# Patient Record
Sex: Female | Born: 1970
Health system: Southern US, Community
[De-identification: ages and names within clinical notes are randomized; demographics above are authoritative.]

## PROBLEM LIST (undated history)

## (undated) DIAGNOSIS — R011 Cardiac murmur, unspecified: Secondary | ICD-10-CM

## (undated) DIAGNOSIS — K7030 Alcoholic cirrhosis of liver without ascites: Secondary | ICD-10-CM

## (undated) DIAGNOSIS — K2950 Unspecified chronic gastritis without bleeding: Secondary | ICD-10-CM

## (undated) DIAGNOSIS — A0472 Enterocolitis due to Clostridium difficile, not specified as recurrent: Secondary | ICD-10-CM

## (undated) DIAGNOSIS — Z9289 Personal history of other medical treatment: Secondary | ICD-10-CM

## (undated) DIAGNOSIS — R51 Headache: Secondary | ICD-10-CM

## (undated) DIAGNOSIS — Z765 Malingerer [conscious simulation]: Secondary | ICD-10-CM

## (undated) DIAGNOSIS — T7840XA Allergy, unspecified, initial encounter: Secondary | ICD-10-CM

## (undated) DIAGNOSIS — J189 Pneumonia, unspecified organism: Secondary | ICD-10-CM

## (undated) DIAGNOSIS — K529 Noninfective gastroenteritis and colitis, unspecified: Secondary | ICD-10-CM

## (undated) DIAGNOSIS — D649 Anemia, unspecified: Secondary | ICD-10-CM

## (undated) DIAGNOSIS — I10 Essential (primary) hypertension: Secondary | ICD-10-CM

## (undated) DIAGNOSIS — G629 Polyneuropathy, unspecified: Secondary | ICD-10-CM

## (undated) DIAGNOSIS — S92902A Unspecified fracture of left foot, initial encounter for closed fracture: Secondary | ICD-10-CM

## (undated) DIAGNOSIS — K219 Gastro-esophageal reflux disease without esophagitis: Secondary | ICD-10-CM

## (undated) DIAGNOSIS — K8010 Calculus of gallbladder with chronic cholecystitis without obstruction: Secondary | ICD-10-CM

## (undated) DIAGNOSIS — F4310 Post-traumatic stress disorder, unspecified: Secondary | ICD-10-CM

## (undated) DIAGNOSIS — F419 Anxiety disorder, unspecified: Secondary | ICD-10-CM

## (undated) DIAGNOSIS — D849 Immunodeficiency, unspecified: Secondary | ICD-10-CM

## (undated) DIAGNOSIS — K7581 Nonalcoholic steatohepatitis (NASH): Secondary | ICD-10-CM

## (undated) DIAGNOSIS — R58 Hemorrhage, not elsewhere classified: Secondary | ICD-10-CM

## (undated) DIAGNOSIS — E039 Hypothyroidism, unspecified: Secondary | ICD-10-CM

## (undated) HISTORY — DX: Allergy, unspecified, initial encounter: T78.40XA

## (undated) HISTORY — PX: FRACTURE SURGERY: SHX138

## (undated) HISTORY — PX: UPPER GASTROINTESTINAL ENDOSCOPY: SHX188

## (undated) HISTORY — PX: COLONOSCOPY: SHX174

## (undated) HISTORY — PX: TONSILLECTOMY: SUR1361

## (undated) HISTORY — DX: Polyneuropathy, unspecified: G62.9

## (undated) HISTORY — PX: POLYPECTOMY: SHX149

---

## 2007-07-28 ENCOUNTER — Ambulatory Visit: Payer: Self-pay | Admitting: Family Medicine

## 2007-08-14 ENCOUNTER — Emergency Department (HOSPITAL_COMMUNITY): Admission: EM | Admit: 2007-08-14 | Discharge: 2007-08-14 | Payer: Self-pay | Admitting: Emergency Medicine

## 2007-08-17 ENCOUNTER — Ambulatory Visit: Payer: Self-pay | Admitting: Family Medicine

## 2007-08-17 ENCOUNTER — Encounter (INDEPENDENT_AMBULATORY_CARE_PROVIDER_SITE_OTHER): Payer: Self-pay | Admitting: Family Medicine

## 2007-08-17 LAB — CONVERTED CEMR LAB
ALT: 12 units/L (ref 0–35)
AST: 17 units/L (ref 0–37)
Alkaline Phosphatase: 49 units/L (ref 39–117)
BUN: 12 mg/dL (ref 6–23)
Chlamydia, DNA Probe: NEGATIVE
Eosinophils Absolute: 0.3 10*3/uL (ref 0.0–0.7)
Eosinophils Relative: 5 % (ref 0–5)
Lymphocytes Relative: 27 % (ref 12–46)
MCHC: 34.7 g/dL (ref 30.0–36.0)
MCV: 94.3 fL (ref 78.0–100.0)
Neutro Abs: 4.1 10*3/uL (ref 1.7–7.7)
Neutrophils Relative %: 60 % (ref 43–77)
Platelets: 369 10*3/uL (ref 150–400)
Total Protein: 7.2 g/dL (ref 6.0–8.3)

## 2007-08-18 ENCOUNTER — Ambulatory Visit: Payer: Self-pay | Admitting: *Deleted

## 2007-11-22 ENCOUNTER — Ambulatory Visit: Payer: Self-pay | Admitting: Family Medicine

## 2007-11-22 ENCOUNTER — Encounter (INDEPENDENT_AMBULATORY_CARE_PROVIDER_SITE_OTHER): Payer: Self-pay | Admitting: Family Medicine

## 2007-11-22 LAB — CONVERTED CEMR LAB
FSH: 4.5 milliintl units/mL
Free T4: 1.31 ng/dL (ref 0.89–1.80)
T3 Uptake Ratio: 34.1 % (ref 22.5–37.0)
T3, Total: 120.7 ng/dL (ref 80.0–204.0)

## 2007-12-06 ENCOUNTER — Ambulatory Visit (HOSPITAL_COMMUNITY): Admission: RE | Admit: 2007-12-06 | Discharge: 2007-12-06 | Payer: Self-pay | Admitting: Family Medicine

## 2008-01-27 ENCOUNTER — Other Ambulatory Visit: Admission: RE | Admit: 2008-01-27 | Discharge: 2008-01-27 | Payer: Self-pay | Admitting: Obstetrics and Gynecology

## 2008-01-27 ENCOUNTER — Ambulatory Visit: Payer: Self-pay | Admitting: Family Medicine

## 2008-02-10 ENCOUNTER — Ambulatory Visit: Payer: Self-pay | Admitting: Obstetrics and Gynecology

## 2008-03-26 ENCOUNTER — Emergency Department (HOSPITAL_COMMUNITY): Admission: EM | Admit: 2008-03-26 | Discharge: 2008-03-27 | Payer: Self-pay | Admitting: Emergency Medicine

## 2008-08-16 ENCOUNTER — Encounter: Payer: Self-pay | Admitting: Obstetrics and Gynecology

## 2008-08-16 ENCOUNTER — Encounter: Payer: Self-pay | Admitting: Family

## 2008-08-16 ENCOUNTER — Ambulatory Visit: Payer: Self-pay | Admitting: Obstetrics and Gynecology

## 2008-08-16 LAB — CONVERTED CEMR LAB: TSH: 0.835 microintl units/mL (ref 0.350–4.50)

## 2009-02-21 ENCOUNTER — Encounter: Payer: Self-pay | Admitting: Advanced Practice Midwife

## 2009-02-21 ENCOUNTER — Ambulatory Visit: Payer: Self-pay | Admitting: Obstetrics & Gynecology

## 2009-12-24 ENCOUNTER — Emergency Department (HOSPITAL_COMMUNITY): Admission: EM | Admit: 2009-12-24 | Discharge: 2009-12-24 | Payer: Self-pay | Admitting: Emergency Medicine

## 2011-03-04 NOTE — Group Therapy Note (Signed)
NAME:  Jamie Allen, Jamie Allen NO.:  [id number]   MEDICAL RECORD NO.:  [id number]          PATIENT TYPE:  WOC   LOCATION:  [facility]                   FACILITY:  [facility]   PHYSICIAN:  Andrew Au, MD        DATE OF BIRTH:  [birth year]   DATE OF SERVICE:  08/16/2008                                  CLINIC NOTE   REASON FOR VISIT:  Six-month followup Pap smear.   HISTORY OF PRESENT ILLNESS:  [name] did have an abnormal Pap smear  showing L-SIL in February 2009.  She did undergo colposcopy in April  2009, results of which were benign and showed no further L-SIL.  She is  here today for 33-monthfollowup Pap.  She also incidentally states she  has not had a period since the end of July.  This has become normal for  her over the last few years, going 2-3 months in between periods, and  even though normal bleeding about 2-3 days.  Otherwise, she has no  complaints and is doing well.   PHYSICAL EXAMINATION:  VITAL SIGNS:  Temperature today 97.8, pulse 69,  blood pressure 112/72, weight is 116 pounds, and height is 62 inches.  GU:  Normal external genitalia without lesions.  Normal introitus.  Vagina is pink and moist with normal rugae.  Cervix is visualized and  benign with no obvious lesions or abnormalities seen.  A Pap smear was  obtained without hemorrhage or friability.  Bimanual pelvic exam was  done, which noted a nontender uterus and ovaries, normal-sized uterus,  and no masses appreciated.  No adnexal masses appreciated.   ASSESSMENT AND PLAN:  Ms. SLotteris a 40year old female with a history  of low-grade squamous intraepithelial lesion and Pap smear and irregular  menses, who comes in today for a repeat Pap smear.  1. For Pap smear.  Pap was obtained today.  She will be called with      the results.  2. For irregular bleeding.  The patient is not desiring to become      pregnant.  Today, we will check urine pregnancy test, although her      uterus was of normal  size today, and we will also check a TSH as      thyroid could possibly be causing her menstrual irregularities.      She was given a prescription for Provera 10 mg and instructed to      take 1 tablet twice daily for 5 days when she has gone greater than      6 weeks without having a period.  She understands this and will      follow up as instructed following her Pap smear results.     ______________________________  LOrland Mustard MD    ______________________________  PAndrew Au MD    LM/MEDQ  D:  08/16/2008  T:  08/17/2008  Job:  [id number]

## 2011-05-04 ENCOUNTER — Emergency Department (HOSPITAL_COMMUNITY)
Admission: EM | Admit: 2011-05-04 | Discharge: 2011-05-05 | Disposition: A | Discharge status: Home / Self Care | Payer: Self-pay | Attending: Emergency Medicine | Admitting: Emergency Medicine

## 2011-05-04 DIAGNOSIS — R109 Unspecified abdominal pain: Secondary | ICD-10-CM | POA: Insufficient documentation

## 2011-05-04 DIAGNOSIS — R1011 Right upper quadrant pain: Secondary | ICD-10-CM | POA: Insufficient documentation

## 2011-05-04 DIAGNOSIS — R112 Nausea with vomiting, unspecified: Secondary | ICD-10-CM | POA: Insufficient documentation

## 2011-05-04 DIAGNOSIS — R1031 Right lower quadrant pain: Secondary | ICD-10-CM | POA: Insufficient documentation

## 2011-05-04 DIAGNOSIS — F1010 Alcohol abuse, uncomplicated: Secondary | ICD-10-CM | POA: Insufficient documentation

## 2011-05-05 LAB — URINALYSIS, ROUTINE W REFLEX MICROSCOPIC
Ketones, ur: NEGATIVE mg/dL
Leukocytes, UA: NEGATIVE
Nitrite: NEGATIVE
Protein, ur: NEGATIVE mg/dL
Specific Gravity, Urine: 1.015 (ref 1.005–1.030)

## 2011-05-05 LAB — COMPREHENSIVE METABOLIC PANEL
Albumin: 4.3 g/dL (ref 3.5–5.2)
CO2: 24 mEq/L (ref 19–32)
Chloride: 104 mEq/L (ref 96–112)
Creatinine, Ser: 0.96 mg/dL (ref 0.50–1.10)
Potassium: 3.3 mEq/L — ABNORMAL LOW (ref 3.5–5.1)
Sodium: 138 mEq/L (ref 135–145)
Total Protein: 8.1 g/dL (ref 6.0–8.3)

## 2011-05-05 LAB — ETHANOL: Alcohol, Ethyl (B): <11 mg/dL (ref 0–11)

## 2011-05-05 LAB — POCT PREGNANCY, URINE: Preg Test, Ur: NEGATIVE

## 2011-05-05 LAB — CBC
Hemoglobin: 14.2 g/dL (ref 12.0–15.0)
MCHC: 35.8 g/dL (ref 30.0–36.0)
RBC: 4.1 MIL/uL (ref 3.87–5.11)
RDW: 13.2 % (ref 11.5–15.5)

## 2011-05-05 LAB — DIFFERENTIAL: Basophils Relative: 0 % (ref 0–1)

## 2011-07-15 LAB — POCT PREGNANCY, URINE: Operator id: 148111

## 2011-07-17 LAB — DIFFERENTIAL
Basophils Relative: 1
Lymphocytes Relative: 22

## 2011-07-17 LAB — POCT I-STAT, CHEM 8
BUN: 13
Chloride: 107
Creatinine, Ser: 0.8
Glucose, Bld: 122 — ABNORMAL HIGH
HCT: 43
Hemoglobin: 14.6
Potassium: 4.1
Sodium: 137

## 2011-07-17 LAB — CBC
Hemoglobin: 13.9
MCHC: 34.8
Platelets: 285
RDW: 13.2

## 2011-07-17 LAB — POCT CARDIAC MARKERS
CKMB, poc: 1.1
Myoglobin, poc: 55.2
Operator id: 261601

## 2011-07-30 LAB — COMPREHENSIVE METABOLIC PANEL
ALT: 16
CO2: 23
Calcium: 9.6
Creatinine, Ser: 0.83
GFR calc non Af Amer: >60
Glucose, Bld: 111 — ABNORMAL HIGH
Sodium: 139

## 2011-07-30 LAB — DIFFERENTIAL
Basophils Absolute: 0.1
Basophils Relative: 1
Eosinophils Absolute: 0.9 — ABNORMAL HIGH
Lymphs Abs: 2.1
Neutrophils Relative %: 54

## 2011-07-30 LAB — CBC
HCT: 43.2
Hemoglobin: 15.1 — ABNORMAL HIGH
MCHC: 35
MCV: 95
Platelets: 384
RBC: 4.54

## 2011-07-30 LAB — URINALYSIS, ROUTINE W REFLEX MICROSCOPIC
Bilirubin Urine: NEGATIVE
Glucose, UA: NEGATIVE
Hgb urine dipstick: NEGATIVE
Specific Gravity, Urine: 1.009
pH: 6.5

## 2011-07-30 LAB — WET PREP, GENITAL
Trich, Wet Prep: NONE SEEN
Yeast Wet Prep HPF POC: NONE SEEN

## 2011-07-30 LAB — GC/CHLAMYDIA PROBE AMP, GENITAL: GC Probe Amp, Genital: NEGATIVE

## 2011-11-26 ENCOUNTER — Emergency Department (HOSPITAL_COMMUNITY)
Admission: EM | Admit: 2011-11-26 | Discharge: 2011-11-26 | Disposition: A | Discharge status: Home / Self Care | Payer: Self-pay | Attending: Emergency Medicine | Admitting: Emergency Medicine

## 2011-11-26 ENCOUNTER — Other Ambulatory Visit: Payer: Self-pay

## 2011-11-26 ENCOUNTER — Encounter (HOSPITAL_COMMUNITY): Payer: Self-pay | Admitting: *Deleted

## 2011-11-26 DIAGNOSIS — Z79899 Other long term (current) drug therapy: Secondary | ICD-10-CM | POA: Insufficient documentation

## 2011-11-26 DIAGNOSIS — F411 Generalized anxiety disorder: Secondary | ICD-10-CM | POA: Insufficient documentation

## 2011-11-26 DIAGNOSIS — I10 Essential (primary) hypertension: Secondary | ICD-10-CM | POA: Insufficient documentation

## 2011-11-26 DIAGNOSIS — F16283 Hallucinogen dependence with hallucinogen persisting perception disorder (flashbacks): Secondary | ICD-10-CM

## 2011-11-26 DIAGNOSIS — F419 Anxiety disorder, unspecified: Secondary | ICD-10-CM

## 2011-11-26 DIAGNOSIS — F19988 Other psychoactive substance use, unspecified with other psychoactive substance-induced disorder: Secondary | ICD-10-CM | POA: Insufficient documentation

## 2011-11-26 DIAGNOSIS — Z7982 Long term (current) use of aspirin: Secondary | ICD-10-CM | POA: Insufficient documentation

## 2011-11-26 HISTORY — DX: Essential (primary) hypertension: I10

## 2011-11-26 MED ORDER — ZOLPIDEM TARTRATE 5 MG PO TABS: 5.0000 mg | ORAL_TABLET | Freq: Every evening | ORAL | Status: DC | PRN
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
## 2011-11-26 NOTE — ED Notes (Addendum)
Pt in c/o high blood pressure x4 days, states she is supposed to be on medication for this but is unable to afford it, c/o headache and dizziness and blurred vision- also c/o heaviness on her chest x2 hours and shortness of breath

## 2012-07-15 ENCOUNTER — Emergency Department (HOSPITAL_COMMUNITY)
Admission: EM | Admit: 2012-07-15 | Discharge: 2012-07-15 | Disposition: A | Discharge status: Home / Self Care | Payer: Self-pay | Attending: Emergency Medicine | Admitting: Emergency Medicine

## 2012-07-15 ENCOUNTER — Encounter (HOSPITAL_COMMUNITY): Payer: Self-pay | Admitting: Emergency Medicine

## 2012-07-15 DIAGNOSIS — K029 Dental caries, unspecified: Secondary | ICD-10-CM | POA: Insufficient documentation

## 2012-07-15 DIAGNOSIS — I10 Essential (primary) hypertension: Secondary | ICD-10-CM | POA: Insufficient documentation

## 2012-07-15 DIAGNOSIS — F17200 Nicotine dependence, unspecified, uncomplicated: Secondary | ICD-10-CM | POA: Insufficient documentation

## 2012-07-15 MED ORDER — HYDROCODONE-ACETAMINOPHEN 5-325 MG PO TABS: 1.0000 | ORAL_TABLET | Freq: Four times a day (QID) | ORAL | Status: DC | PRN

## 2012-07-15 MED ORDER — PENICILLIN V POTASSIUM 500 MG PO TABS: 500.0000 mg | ORAL_TABLET | Freq: Four times a day (QID) | ORAL | Status: DC

## 2012-07-15 MED ORDER — PENICILLIN V POTASSIUM 500 MG PO TABS
500.0000 mg | ORAL_TABLET | Freq: Once | ORAL | Status: AC
  Administered 2012-07-15: 500 mg via ORAL
  Filled 2012-07-15: qty 1

## 2012-07-15 MED ORDER — HYDROCODONE-ACETAMINOPHEN 5-325 MG PO TABS
1.0000 | ORAL_TABLET | Freq: Once | ORAL | Status: AC
  Administered 2012-07-15: 1 via ORAL
  Filled 2012-07-15: qty 1

## 2012-07-15 NOTE — ED Provider Notes (Signed)
History     CSN: [id number]  Arrival date & time 07/15/12  1653   First MD Initiated Contact with Patient 07/15/12 1715      Chief Complaint  Patient presents with  . Dental Pain    (Consider location/radiation/quality/duration/timing/severity/associated sxs/prior treatment) HPI Comments: Patient presents with extensive dental decay, and gum swelling is seen going on for months.  She states she has a dentist appointment in about 2 weeks.  She is taking over-the-counter BC powder, without any relief  Patient is a 41 y.o. female presenting with tooth pain. The history is provided by the patient.  Dental PainThe primary symptoms include mouth pain and dental injury. Primary symptoms do not include headaches, fever or sore throat. The symptoms began more than 1 month ago. The symptoms are worsening. The symptoms occur constantly.  Additional symptoms do not include: trouble swallowing.    Past Medical History  Diagnosis Date  . Hypertension   . Ovarian cyst     History reviewed. No pertinent past surgical history.  No family history on file.  History  Substance Use Topics  . Smoking status: Current Some Day Smoker  . Smokeless tobacco: Not on file  . Alcohol Use: Yes     occassionally    OB History    Grav Para Term Preterm Abortions TAB SAB Ect Mult Living                  Review of Systems  Constitutional: Negative for fever and chills.  HENT: Positive for dental problem. Negative for congestion, sore throat, rhinorrhea, trouble swallowing and neck stiffness.   Neurological: Negative for dizziness, weakness and headaches.    Allergies  Morphine and related  Home Medications   Current Outpatient Rx  Name Route Sig Dispense Refill  . BC HEADACHE POWDER PO Oral Take 2 packets by mouth as needed. packet    . HYDROCODONE-ACETAMINOPHEN 5-325 MG PO TABS Oral Take 1 tablet by mouth every 6 (six) hours as needed for pain. 17 tablet 0  . PENICILLIN V POTASSIUM 500 MG  PO TABS Oral Take 1 tablet (500 mg total) by mouth 4 (four) times daily. 39 tablet 0    BP 139/95  Pulse 107  Temp 98.5 F (36.9 C) (Oral)  Resp 20  SpO2 100%  LMP 06/14/2012  Physical Exam  Constitutional: She appears well-developed and well-nourished.  HENT:  Head: Normocephalic.       Every 2 thoracic cavity, most or just shells with central decay.  The upper central incisors have decay along the gumline.  The lower incisors have decay, anterior and posterior along the gumline.  With moderate amount of gum swelling  Eyes: Pupils are equal, round, and reactive to light.  Neck: Normal range of motion.  Cardiovascular: Normal rate.   Pulmonary/Chest: Effort normal.  Abdominal: Soft.  Musculoskeletal: Normal range of motion.  Neurological: She is alert.  Skin: Skin is warm.    ED Course  Procedures (including critical care time)  Labs Reviewed - No data to display No results found.   1. Dental decay       MDM   Extensive dental decay.  Will start patient on antibiotic pain medication.  And fortunately, she was referred to the dental clinic.  That is being held tomorrow, and Saturday        Arman Filter, NP 07/15/12 7846

## 2012-07-15 NOTE — ED Notes (Signed)
Pt presenting to ed with c/o left lower toothache pain x 2 weeks pt states pain is unbearable. Pt states "my boyfriend is taking a Designer, fashion/clothing so that I can get my mouth fixed" pt states she has swelling to her face.

## 2012-07-15 NOTE — ED Provider Notes (Signed)
Medical screening examination/treatment/procedure(s) were performed by non-physician practitioner and as supervising physician I was immediately available for consultation/collaboration. Devoria Albe, MD, Armando Gang   Ward Givens, MD 07/15/12 [phone]

## 2012-08-21 ENCOUNTER — Encounter (HOSPITAL_COMMUNITY): Payer: Self-pay | Admitting: Emergency Medicine

## 2012-08-21 ENCOUNTER — Emergency Department (HOSPITAL_COMMUNITY)
Admission: EM | Admit: 2012-08-21 | Discharge: 2012-08-21 | Disposition: A | Discharge status: Home / Self Care | Payer: Self-pay | Attending: Emergency Medicine | Admitting: Emergency Medicine

## 2012-08-21 DIAGNOSIS — F17200 Nicotine dependence, unspecified, uncomplicated: Secondary | ICD-10-CM | POA: Insufficient documentation

## 2012-08-21 DIAGNOSIS — K0889 Other specified disorders of teeth and supporting structures: Secondary | ICD-10-CM

## 2012-08-21 DIAGNOSIS — I10 Essential (primary) hypertension: Secondary | ICD-10-CM | POA: Insufficient documentation

## 2012-08-21 DIAGNOSIS — Z8742 Personal history of other diseases of the female genital tract: Secondary | ICD-10-CM | POA: Insufficient documentation

## 2012-08-21 DIAGNOSIS — K089 Disorder of teeth and supporting structures, unspecified: Secondary | ICD-10-CM | POA: Insufficient documentation

## 2012-08-21 MED ORDER — PENICILLIN V POTASSIUM 500 MG PO TABS: 500.0000 mg | ORAL_TABLET | Freq: Four times a day (QID) | ORAL | Status: DC

## 2012-08-21 MED ORDER — OXYCODONE-ACETAMINOPHEN 5-325 MG PO TABS
2.0000 | ORAL_TABLET | Freq: Once | ORAL | Status: AC
  Administered 2012-08-21: 2 via ORAL
  Filled 2012-08-21: qty 2

## 2012-08-21 MED ORDER — HYDROCODONE-ACETAMINOPHEN 5-325 MG PO TABS: 2.0000 | ORAL_TABLET | ORAL | Status: DC | PRN

## 2012-08-21 MED ORDER — HYDROCODONE-ACETAMINOPHEN 5-325 MG PO TABS: 1.0000 | ORAL_TABLET | ORAL | Status: DC | PRN

## 2012-08-21 NOTE — ED Notes (Signed)
Was seen here about a month ago for multiple dental caries-- is going to [facility] to have all teeth out at a clinic in a month

## 2012-08-21 NOTE — ED Provider Notes (Signed)
History     CSN: [id number]  Arrival date & time 08/21/12  6578   First MD Initiated Contact with Patient 08/21/12 1130      Chief Complaint  Patient presents with  . tooth abcess     (Consider location/radiation/quality/duration/timing/severity/associated sxs/prior treatment) HPI Comments: This is a 41 year old female, who presents to the emergency department with chief complaint of toothache and dental pain. Patient states that her mouth is bothering her for the past month. She has been seen repeatedly for the same complaint, and states that she is going to [place] to see an Transport planner about having her teeth removed and dentures placed. Patient endorses moderate to severe pain. She denies fever, nausea, vomiting, difficulty swallowing. She states that when she gets antibiotics for pain will receive, but that will come back a couple of weeks later. She is asking for one more course to last her until she can make it to the dentist on November 25.  The history is provided by the patient. No language interpreter was used.    Past Medical History  Diagnosis Date  . Hypertension   . Ovarian cyst     History reviewed. No pertinent past surgical history.  No family history on file.  History  Substance Use Topics  . Smoking status: Current Some Day Smoker  . Smokeless tobacco: Not on file  . Alcohol Use: Yes     occassionally    OB History    Grav Para Term Preterm Abortions TAB SAB Ect Mult Living                  Review of Systems  HENT:       "Rotten teeth"  All other systems reviewed and are negative.    Allergies  Morphine and related  Home Medications   Current Outpatient Rx  Name Route Sig Dispense Refill  . BC HEADACHE POWDER PO Oral Take 2 packets by mouth as needed. packet    . HYDROCODONE-ACETAMINOPHEN 5-325 MG PO TABS Oral Take 1 tablet by mouth every 6 (six) hours as needed for pain. 17 tablet 0  . HYDROCODONE-ACETAMINOPHEN 5-325 MG PO TABS  Oral Take 1 tablet by mouth every 4 (four) hours as needed for pain. 12 tablet 0  . PENICILLIN V POTASSIUM 500 MG PO TABS Oral Take 1 tablet (500 mg total) by mouth 4 (four) times daily. 39 tablet 0  . PENICILLIN V POTASSIUM 500 MG PO TABS Oral Take 1 tablet (500 mg total) by mouth 4 (four) times daily. 40 tablet 0    BP 149/92  Pulse 97  Temp 98.3 F (36.8 C) (Oral)  Resp 18  SpO2 96%  LMP 07/28/2012  Physical Exam  Nursing note and vitals reviewed. Constitutional: She is oriented to person, place, and time. She appears well-developed and well-nourished.  HENT:  Head: Normocephalic and atraumatic.       Poor dentition throughout, mild swelling and erythema surrounding gingiva, no fluctuance, uvula is midline, no signs of peritonsillar or tonsillar abscess. The airway is intact.  Eyes: Conjunctivae normal and EOM are normal. Pupils are equal, round, and reactive to light.  Neck: Normal range of motion. Neck supple.  Cardiovascular: Normal rate, regular rhythm and normal heart sounds.   Pulmonary/Chest: Effort normal and breath sounds normal. No respiratory distress. She has no wheezes. She has no rales. She exhibits no tenderness.  Abdominal: Soft. Bowel sounds are normal. She exhibits no distension and no mass. There is no tenderness.  There is no rebound and no guarding.  Musculoskeletal: Normal range of motion.  Neurological: She is alert and oriented to person, place, and time.  Skin: Skin is warm and dry.  Psychiatric: She has a normal mood and affect. Her behavior is normal. Judgment and thought content normal.    ED Course  Procedures (including critical care time)  Labs Reviewed - No data to display No results found.   1. Toothache       MDM  41 year old female with dental pain. I'm going to prescribe penicillin and a short course of pain medicine for the patient again. Patient states that she is going to followup with the dentist later this month, and that she has  appointment scheduled already. I told her that we are not going to be able to fix her teeth, and that she must see the dentist. She acknowledges this, and requests antibiotics at this can last her until she sees the dentist. Return precautions have been given. The patient is stable and ready for discharge.        Roxy Horseman, PA-C 08/21/12 1256

## 2012-08-21 NOTE — ED Provider Notes (Signed)
Medical screening examination/treatment/procedure(s) were performed by non-physician practitioner and as supervising physician I was immediately available for consultation/collaboration.  Doug Sou, MD 08/21/12 [phone]

## 2013-03-04 ENCOUNTER — Emergency Department (HOSPITAL_BASED_OUTPATIENT_CLINIC_OR_DEPARTMENT_OTHER)
Admission: EM | Admit: 2013-03-04 | Discharge: 2013-03-04 | Disposition: A | Discharge status: Home / Self Care | Payer: Self-pay | Attending: Emergency Medicine | Admitting: Emergency Medicine

## 2013-03-04 ENCOUNTER — Encounter (HOSPITAL_BASED_OUTPATIENT_CLINIC_OR_DEPARTMENT_OTHER): Payer: Self-pay | Admitting: *Deleted

## 2013-03-04 DIAGNOSIS — Z8742 Personal history of other diseases of the female genital tract: Secondary | ICD-10-CM | POA: Insufficient documentation

## 2013-03-04 DIAGNOSIS — R197 Diarrhea, unspecified: Secondary | ICD-10-CM | POA: Insufficient documentation

## 2013-03-04 DIAGNOSIS — F17200 Nicotine dependence, unspecified, uncomplicated: Secondary | ICD-10-CM | POA: Insufficient documentation

## 2013-03-04 DIAGNOSIS — R112 Nausea with vomiting, unspecified: Secondary | ICD-10-CM | POA: Insufficient documentation

## 2013-03-04 DIAGNOSIS — Z3202 Encounter for pregnancy test, result negative: Secondary | ICD-10-CM | POA: Insufficient documentation

## 2013-03-04 DIAGNOSIS — I10 Essential (primary) hypertension: Secondary | ICD-10-CM | POA: Insufficient documentation

## 2013-03-04 LAB — CBC WITH DIFFERENTIAL/PLATELET
Basophils Relative: 1 % (ref 0–1)
Eosinophils Relative: 4 % (ref 0–5)
HCT: 38.1 % (ref 36.0–46.0)
Hemoglobin: 14.2 g/dL (ref 12.0–15.0)
Lymphs Abs: 2 10*3/uL (ref 0.7–4.0)
MCH: 37.4 pg — ABNORMAL HIGH (ref 26.0–34.0)
MCV: 100.3 fL — ABNORMAL HIGH (ref 78.0–100.0)
Monocytes Absolute: 0.4 10*3/uL (ref 0.1–1.0)
Neutro Abs: 3.5 10*3/uL (ref 1.7–7.7)
RBC: 3.8 MIL/uL — ABNORMAL LOW (ref 3.87–5.11)

## 2013-03-04 LAB — COMPREHENSIVE METABOLIC PANEL
BUN: 4 mg/dL — ABNORMAL LOW (ref 6–23)
Calcium: 9.5 mg/dL (ref 8.4–10.5)
GFR calc Af Amer: >90 mL/min (ref >90)
GFR calc non Af Amer: 90 mL/min — ABNORMAL LOW (ref >90)
Glucose, Bld: 101 mg/dL — ABNORMAL HIGH (ref 70–99)
Total Protein: 6.8 g/dL (ref 6.0–8.3)

## 2013-03-04 LAB — URINALYSIS, ROUTINE W REFLEX MICROSCOPIC
Ketones, ur: NEGATIVE mg/dL
Leukocytes, UA: NEGATIVE
Nitrite: NEGATIVE
Specific Gravity, Urine: 1.004 — ABNORMAL LOW (ref 1.005–1.030)
Urobilinogen, UA: 0.2 mg/dL (ref 0.0–1.0)
pH: 6.5 (ref 5.0–8.0)

## 2013-03-04 MED ORDER — SODIUM CHLORIDE 0.9 % IV BOLUS (SEPSIS)
1000.0000 mL | Freq: Once | INTRAVENOUS | Status: AC
  Administered 2013-03-04: 1000 mL via INTRAVENOUS

## 2013-03-04 MED ORDER — DROPERIDOL 2.5 MG/ML IJ SOLN: 1.2500 mg | Freq: Once | INTRAMUSCULAR | Status: DC

## 2013-03-04 MED ORDER — POTASSIUM CHLORIDE CRYS ER 20 MEQ PO TBCR
40.0000 meq | EXTENDED_RELEASE_TABLET | Freq: Once | ORAL | Status: AC
  Administered 2013-03-04: 40 meq via ORAL
  Filled 2013-03-04: qty 2

## 2013-03-04 MED ORDER — DIPHENHYDRAMINE HCL 50 MG/ML IJ SOLN
25.0000 mg | Freq: Once | INTRAMUSCULAR | Status: AC
  Administered 2013-03-04: 25 mg via INTRAVENOUS
  Filled 2013-03-04: qty 1

## 2013-03-04 MED ORDER — SODIUM CHLORIDE 0.9 % IV BOLUS (SEPSIS)
25.0000 mL | Freq: Once | INTRAVENOUS | Status: AC
  Administered 2013-03-04: 25 mL via INTRAVENOUS

## 2013-03-04 MED ORDER — PROMETHAZINE HCL 25 MG/ML IJ SOLN
25.0000 mg | Freq: Once | INTRAMUSCULAR | Status: AC
  Administered 2013-03-04: 25 mg via INTRAVENOUS
  Filled 2013-03-04: qty 1

## 2013-03-04 MED ORDER — ONDANSETRON HCL 4 MG/2ML IJ SOLN
4.0000 mg | Freq: Once | INTRAMUSCULAR | Status: AC
  Administered 2013-03-04: 4 mg via INTRAVENOUS
  Filled 2013-03-04: qty 2

## 2013-03-04 MED ORDER — ONDANSETRON 8 MG PO TBDP: 8.0000 mg | ORAL_TABLET | Freq: Three times a day (TID) | ORAL | Status: DC | PRN

## 2013-03-04 NOTE — ED Notes (Signed)
Pt reports feeling better but still has mild cramping in abdomen and headache is still present but not as severe.

## 2013-03-04 NOTE — ED Notes (Signed)
Pt discharged home with spouse  

## 2013-03-04 NOTE — ED Provider Notes (Signed)
History     CSN: [id number]  Arrival date & time 03/04/13  1830   First MD Initiated Contact with Patient 03/04/13 1850      Chief Complaint  Patient presents with  . Emesis  . Migraine  . Diarrhea    (Consider location/radiation/quality/duration/timing/severity/associated sxs/prior treatment) HPI Patient presents today complaining of four-day history of headache with nausea, vomiting, and diarrhea today. She has had some subjective fever and chills. She denies any chest pain, cough, nasal congestion, or rash. 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 Past Medical History  Diagnosis Date  . Hypertension   . Ovarian cyst     History reviewed. No pertinent past surgical history.  History reviewed. No pertinent family history.  History  Substance Use Topics  . Smoking status: Current Some Day Smoker -- 0.50 packs/day    Types: Cigarettes  . Smokeless tobacco: Not on file  . Alcohol Use: No     Comment: occassionally    OB History   Grav Para Term Preterm Abortions TAB SAB Ect Mult Living                  Review of Systems  Allergies  Morphine and related  Home Medications   Current Outpatient Rx  Name  Route  Sig  Dispense  Refill  . Aspirin-Salicylamide-Caffeine (BC HEADACHE POWDER PO)   Oral   Take 2 packets by mouth as needed. packet         . HYDROcodone-acetaminophen (NORCO/VICODIN) 5-325 MG per tablet   Oral   Take 1 tablet by mouth every 6 (six) hours as needed for pain.   17 tablet   0   . HYDROcodone-acetaminophen (NORCO/VICODIN) 5-325 MG per tablet   Oral   Take 1 tablet by mouth every 4 (four) hours as needed for pain.   12 tablet   0   . penicillin v potassium (VEETID) 500 MG tablet   Oral   Take 1 tablet (500 mg total) by mouth 4 (four) times daily.   39 tablet   0   . penicillin v potassium (VEETID) 500 MG tablet   Oral   Take 1 tablet (500 mg total) by mouth 4 (four) times  daily.   40 tablet   0     BP 144/94  Pulse 102  Temp(Src) 98.5 F (36.9 C) (Oral)  Resp 16  Ht 5' 1"  (1.549 m)  Wt 115 lb (52.164 kg)  BMI 21.74 kg/m2  SpO2 100%  LMP 02/06/2013  Physical Exam  Nursing note and vitals reviewed. Constitutional: She is oriented to person, place, and time. She appears well-developed and well-nourished.  HENT:  Head: Normocephalic and atraumatic.  Eyes: Conjunctivae are normal. Pupils are equal, round, and reactive to light.  Neck: Normal range of motion. Neck supple.  Cardiovascular: Normal rate, regular rhythm, normal heart sounds and intact distal pulses.   Pulmonary/Chest: Effort normal and breath sounds normal.  Abdominal: Soft. Bowel sounds are normal.  Musculoskeletal: Normal range of motion.  Neurological: She is alert and oriented to person, place, and time.  Skin: Skin is warm and dry.  Psychiatric: She has a normal mood and affect. Her behavior is normal. Judgment and thought content normal.    ED Course  Procedures (including critical care time)  Labs Reviewed  CBC WITH DIFFERENTIAL - Abnormal; Notable for the following:    RBC 3.80 (*)    MCV 100.3 (*)  MCH 37.4 (*)    MCHC 37.3 (*)    All other components within normal limits  COMPREHENSIVE METABOLIC PANEL - Abnormal; Notable for the following:    Potassium 3.2 (*)    Glucose, Bld 101 (*)    BUN 4 (*)    Total Bilirubin 0.2 (*)    GFR calc non Af Amer 90 (*)    All other components within normal limits  URINALYSIS, ROUTINE W REFLEX MICROSCOPIC - Abnormal; Notable for the following:    Specific Gravity, Urine 1.004 (*)    All other components within normal limits   No results found.   No diagnosis found.    MDM  Patient improved after iv ns and antiemetics No vomiting         Shaune Pollack, MD 03/04/13 2107

## 2013-03-04 NOTE — ED Notes (Signed)
Pt reports n/v/d x 3 days.  

## 2013-06-12 ENCOUNTER — Emergency Department (HOSPITAL_BASED_OUTPATIENT_CLINIC_OR_DEPARTMENT_OTHER)
Admission: EM | Admit: 2013-06-12 | Discharge: 2013-06-12 | Disposition: A | Discharge status: Home / Self Care | Payer: Self-pay | Attending: Emergency Medicine | Admitting: Emergency Medicine

## 2013-06-12 ENCOUNTER — Encounter (HOSPITAL_BASED_OUTPATIENT_CLINIC_OR_DEPARTMENT_OTHER): Payer: Self-pay | Admitting: *Deleted

## 2013-06-12 DIAGNOSIS — R21 Rash and other nonspecific skin eruption: Secondary | ICD-10-CM | POA: Insufficient documentation

## 2013-06-12 DIAGNOSIS — Z8742 Personal history of other diseases of the female genital tract: Secondary | ICD-10-CM | POA: Insufficient documentation

## 2013-06-12 DIAGNOSIS — R220 Localized swelling, mass and lump, head: Secondary | ICD-10-CM | POA: Insufficient documentation

## 2013-06-12 DIAGNOSIS — K1370 Unspecified lesions of oral mucosa: Secondary | ICD-10-CM | POA: Insufficient documentation

## 2013-06-12 DIAGNOSIS — F17200 Nicotine dependence, unspecified, uncomplicated: Secondary | ICD-10-CM | POA: Insufficient documentation

## 2013-06-12 DIAGNOSIS — I10 Essential (primary) hypertension: Secondary | ICD-10-CM | POA: Insufficient documentation

## 2013-06-12 DIAGNOSIS — T4995XA Adverse effect of unspecified topical agent, initial encounter: Secondary | ICD-10-CM | POA: Insufficient documentation

## 2013-06-12 DIAGNOSIS — T7840XA Allergy, unspecified, initial encounter: Secondary | ICD-10-CM

## 2013-06-12 NOTE — ED Notes (Signed)
Patient c/o facial swelling/redness/tongue swelling, took ibuprofen no relief

## 2013-06-12 NOTE — ED Provider Notes (Signed)
CSN: [id number]     Arrival date & time 06/12/13  0815 History     First MD Initiated Contact with Patient 06/12/13 0818     Chief Complaint  Patient presents with  . Allergic Reaction   (Consider location/radiation/quality/duration/timing/severity/associated sxs/prior Treatment) Patient is a 42 y.o. female presenting with allergic reaction. The history is provided by the patient.  Allergic Reaction Presenting symptoms: rash and swelling   Presenting symptoms: no difficulty breathing, no difficulty swallowing, no itching and no wheezing   Rash:    Location:  Face   Quality: blistering and draining     Severity:  Mild   Onset quality:  Gradual   Timing:  Intermittent   Progression:  Unchanged Severity:  Mild Prior allergic episodes:  No prior episodes Context: new detergents/soaps (possible)   Context: no animal exposure, no chemicals, no insect bite/sting, no medications, no nuts and no poison ivy   Relieved by:  Nothing Worsened by:  Nothing tried   Past Medical History  Diagnosis Date  . Hypertension   . Ovarian cyst    Past Surgical History  Procedure Laterality Date  . Tonsillectomy     No family history on file. History  Substance Use Topics  . Smoking status: Current Some Day Smoker -- 0.50 packs/day    Types: Cigarettes  . Smokeless tobacco: Not on file  . Alcohol Use: Yes     Comment: occassionally   OB History   Grav Para Term Preterm Abortions TAB SAB Ect Mult Living                 Review of Systems  Constitutional: Negative for fever and chills.  HENT: Positive for mouth sores (intermittent, tongue and bilateral buccal mucosa). Negative for hearing loss, ear pain, congestion, sore throat, rhinorrhea, sneezing, drooling, trouble swallowing, neck pain, postnasal drip, sinus pressure and ear discharge.   Respiratory: Negative for cough, choking, shortness of breath and wheezing.   Gastrointestinal: Negative for vomiting and abdominal pain.  Skin:  Positive for rash. Negative for itching.  All other systems reviewed and are negative.    Allergies  Morphine and related  Home Medications   Current Outpatient Rx  Name  Route  Sig  Dispense  Refill  . Aspirin-Salicylamide-Caffeine (BC HEADACHE POWDER PO)   Oral   Take 2 packets by mouth as needed. packet         . HYDROcodone-acetaminophen (NORCO/VICODIN) 5-325 MG per tablet   Oral   Take 1 tablet by mouth every 6 (six) hours as needed for pain.   17 tablet   0   . HYDROcodone-acetaminophen (NORCO/VICODIN) 5-325 MG per tablet   Oral   Take 1 tablet by mouth every 4 (four) hours as needed for pain.   12 tablet   0   . ondansetron (ZOFRAN ODT) 8 MG disintegrating tablet   Oral   Take 1 tablet (8 mg total) by mouth every 8 (eight) hours as needed for nausea.   20 tablet   0   . penicillin v potassium (VEETID) 500 MG tablet   Oral   Take 1 tablet (500 mg total) by mouth 4 (four) times daily.   39 tablet   0   . penicillin v potassium (VEETID) 500 MG tablet   Oral   Take 1 tablet (500 mg total) by mouth 4 (four) times daily.   40 tablet   0    BP 147/103  Pulse 77  Temp(Src) 98.4 F (36.9 C) (  Oral)  Resp 18  SpO2 100% Physical Exam  Nursing note and vitals reviewed. Constitutional: She is oriented to person, place, and time. She appears well-developed and well-nourished. No distress.  HENT:  Head: Normocephalic and atraumatic.  No rash on face. Airway patent. No stridor. No sores on inside of mouth. No cheek swelling. No cellulitis.  Eyes: EOM are normal. Pupils are equal, round, and reactive to light.  Neck: Normal range of motion. Neck supple.  Cardiovascular: Normal rate and regular rhythm.  Exam reveals no friction rub.   No murmur heard. Pulmonary/Chest: Effort normal and breath sounds normal. No respiratory distress. She has no wheezes. She has no rales.  Abdominal: Soft. She exhibits no distension. There is no tenderness. There is no rebound.   Musculoskeletal: Normal range of motion. She exhibits no edema.  Neurological: She is alert and oriented to person, place, and time.  Skin: She is not diaphoretic.    ED Course   Procedures (including critical care time)  Labs Reviewed - No data to display No results found. 1. Allergic reaction, initial encounter     MDM  A 42 year old female presents with concerns for facial rash and swelling. Started Wednesday. Symptoms included intermittent blistering facial rash on bilateral cheeks. Also states some sort of inside of her mouth. Unknown cause. No recent new medications start started or stopped. No known exposure to animals, plants, foods. Denies any fevers. Stated some tongue swelling yesterday, however denied any airway symptoms like difficulty breathing, stridor, scratchy throat, voice changes. Vitals are stable here. On exam, airways patent. No stridor no airway obstruction at all. No sores inside the mouth or tongue. Cheeks are nonswollen and no rash is evident on the outside. No buccal the coastal rash at all. No neck lymphadenopathy. Neck is supple. No submental swelling. No concern for Ludwig angina. Unclear etiology of patient's symptoms. Instructed this could be some type of allergy or may be a possible virus due to the blisters and weeping sores on her face that are not present today. Patient instructed to take some Benadryl try some antihistamines for possible allergies. Did not like a boxer work. Patient refused Benadryl here say she get over-the-counter. Patient given multiple resources for followup.   Dagmar Hait, MD 06/12/13 [phone]

## 2013-07-07 ENCOUNTER — Ambulatory Visit: Payer: Self-pay | Attending: Internal Medicine | Admitting: Internal Medicine

## 2013-07-07 ENCOUNTER — Encounter: Payer: Self-pay | Admitting: Internal Medicine

## 2013-07-07 VITALS — BP 139/89 | HR 86 | Temp 98.3°F | Ht 61.0 in | Wt 119.2 lb

## 2013-07-07 DIAGNOSIS — R197 Diarrhea, unspecified: Secondary | ICD-10-CM | POA: Insufficient documentation

## 2013-07-07 DIAGNOSIS — F17200 Nicotine dependence, unspecified, uncomplicated: Secondary | ICD-10-CM | POA: Insufficient documentation

## 2013-07-07 DIAGNOSIS — R1011 Right upper quadrant pain: Secondary | ICD-10-CM | POA: Insufficient documentation

## 2013-07-07 DIAGNOSIS — R109 Unspecified abdominal pain: Secondary | ICD-10-CM | POA: Insufficient documentation

## 2013-07-07 LAB — CBC
MCH: 37.8 pg — ABNORMAL HIGH (ref 26.0–34.0)
Platelets: 380 10*3/uL (ref 150–400)
RBC: 3.89 MIL/uL (ref 3.87–5.11)
RDW: 15.1 % (ref 11.5–15.5)
WBC: 7.7 10*3/uL (ref 4.0–10.5)

## 2013-07-07 LAB — POCT URINALYSIS DIPSTICK
Bilirubin, UA: NEGATIVE
Glucose, UA: NEGATIVE
Ketones, UA: NEGATIVE
Spec Grav, UA: 1.01
Urobilinogen, UA: 0.2

## 2013-07-07 MED ORDER — DICYCLOMINE HCL 10 MG PO CAPS: 10.0000 mg | ORAL_CAPSULE | Freq: Three times a day (TID) | ORAL | Status: DC

## 2013-07-07 NOTE — Progress Notes (Signed)
Patient ID: Jamie Allen, female   DOB: 1971-04-18, 42 y.o.   MRN: [id number] PCP:  Angelica Chessman, MD    Chief Complaint:  Establish care  HPI: 42 year old female who was seen in the ED recently for allergy and breakouts in her face. On evaluation in the ED normal abnormality was found and was discharged home. She reports that she has a vague pain in her right flank radiating to the abdomen for almost a year off and on. He reports having loose bowel movements several times a day which is also ongoing for a year. Also reports increased abdominal fullness with poor appetite. Reports getting up in the night to have bowel movements. She reports history of drug addiction with cocaine until 9 years back and has been clean since then. She denies any fever, chills, headache, blurred vision, chest pain, palpitations, shortness of breath, dysuria. Denies any joint pains. Denies any significant weight loss.  Allergies: Allergies  Allergen Reactions  . Morphine And Related Other (See Comments)    unknown    Prior to Admission medications   Medication Sig Start Date End Date Taking? Authorizing Provider  Aspirin-Salicylamide-Caffeine (BC HEADACHE POWDER PO) Take 2 packets by mouth as needed. packet   Yes Historical Provider, MD  dicyclomine (BENTYL) 10 MG capsule Take 1 capsule (10 mg total) by mouth 4 (four) times daily -  before meals and at bedtime. 07/07/13   Louellen Molder, MD    Past Medical History  Diagnosis Date  . Hypertension   . Ovarian cyst     Past Surgical History  Procedure Laterality Date  . Tonsillectomy      Social History:  reports that she has been smoking Cigarettes.  She has been smoking about 0.50 packs per day. She does not have any smokeless tobacco history on file. She reports that  drinks alcohol. She reports that she does not use illicit drugs.  No family history on file.  Review of Systems:  As outlined in history of present illness  Physical  Exam:  Filed Vitals:   07/07/13 1509  BP: 139/89  Pulse: 86  Temp: 98.3 F (36.8 C)  TempSrc: Oral  Height: 5' 1"  (1.549 m)  Weight: 119 lb 3.2 oz (54.069 kg)  SpO2: 97%    Constitutional: Vital signs reviewed.  Patient is a well-developed and well-nourished in no acute distress and cooperative with exam.  Head: Normocephalic and atraumatic Mouth: no erythema or exudates, MMM Eyes: PERRL, EOMI, conjunctivae normal, No scleral icterus.  Neck: Supple, no mass, thyromegaly,  Cardiovascular: RRR, S1 normal, S2 normal, no MRG, Pulmonary/Chest: CTAB, no wheezes, rales, or rhonchi Abdominal: Soft. Palpable liver with sharp border about 10 centimeter below the right subcostal margin. Measures about 10 cm in length.. tender to palpation, , non-distended, bowel sounds are normal, GU: no CVA tenderness  extremities: Warm, no edema CNS: AAO x3  Labs on Admission:  No results found for this or any previous visit (from the past 48 hour(s)).  Radiological Exams on Admission: No results found.  Assessment/Plan right flank and abdominal pain No clear etiology. Associated with several months of diarrhea concerning for IBS. I will order labs for CBC, complete metabolic panel to evaluate LFTs and given enlarged liver, GI panel, TSH, UA, abdominal ultrasound, HIV and hep C antibody. Start her on a low dose of dicyclomine. She should followup in the clinic for results in 2 weeks  Patient counseled on smoking cessation. Also reports drinking 3-4 glasses of wine almost on  a daily basis and also on Etoh cessation.  Follow up in 2 weeks   Walther Sanagustin 07/07/2013, 3:39 PM

## 2013-07-08 LAB — COMPREHENSIVE METABOLIC PANEL
ALT: 63 U/L — ABNORMAL HIGH (ref 0–35)
AST: 93 U/L — ABNORMAL HIGH (ref 0–37)
CO2: 25 mEq/L (ref 19–32)
Creat: 0.86 mg/dL (ref 0.50–1.10)
Sodium: 136 mEq/L (ref 135–145)
Total Bilirubin: 0.6 mg/dL (ref 0.3–1.2)
Total Protein: 7.9 g/dL (ref 6.0–8.3)

## 2013-07-08 LAB — TSH: TSH: 3.577 u[IU]/mL (ref 0.350–4.500)

## 2013-07-11 ENCOUNTER — Ambulatory Visit (HOSPITAL_COMMUNITY)
Admission: RE | Admit: 2013-07-11 | Discharge: 2013-07-11 | Disposition: A | Discharge status: Home / Self Care | Payer: Self-pay | Source: Ambulatory Visit | Attending: Internal Medicine | Admitting: Internal Medicine

## 2013-07-11 DIAGNOSIS — R1011 Right upper quadrant pain: Secondary | ICD-10-CM | POA: Insufficient documentation

## 2013-07-11 DIAGNOSIS — N289 Disorder of kidney and ureter, unspecified: Secondary | ICD-10-CM | POA: Insufficient documentation

## 2013-07-11 DIAGNOSIS — R109 Unspecified abdominal pain: Secondary | ICD-10-CM

## 2013-07-11 DIAGNOSIS — R7989 Other specified abnormal findings of blood chemistry: Secondary | ICD-10-CM | POA: Insufficient documentation

## 2013-07-11 DIAGNOSIS — K769 Liver disease, unspecified: Secondary | ICD-10-CM | POA: Insufficient documentation

## 2013-07-11 LAB — GASTROINTESTINAL PATHOGEN PANEL PCR
C. difficile Tox A/B, PCR: NEGATIVE
Campylobacter, PCR: NEGATIVE
E coli (ETEC) LT/ST PCR: NEGATIVE
E coli (STEC) stx1/stx2, PCR: NEGATIVE
Rotavirus A, PCR: NEGATIVE

## 2013-07-21 ENCOUNTER — Ambulatory Visit (HOSPITAL_BASED_OUTPATIENT_CLINIC_OR_DEPARTMENT_OTHER): Payer: No Typology Code available for payment source | Admitting: Internal Medicine

## 2013-07-21 ENCOUNTER — Ambulatory Visit: Payer: Self-pay | Attending: Internal Medicine

## 2013-07-21 ENCOUNTER — Encounter: Payer: Self-pay | Admitting: Internal Medicine

## 2013-07-21 VITALS — BP 135/92 | HR 98 | Temp 98.7°F | Resp 16 | Ht 61.0 in | Wt 116.0 lb

## 2013-07-21 DIAGNOSIS — R109 Unspecified abdominal pain: Secondary | ICD-10-CM

## 2013-07-21 DIAGNOSIS — R197 Diarrhea, unspecified: Secondary | ICD-10-CM

## 2013-07-21 DIAGNOSIS — D539 Nutritional anemia, unspecified: Secondary | ICD-10-CM | POA: Insufficient documentation

## 2013-07-21 DIAGNOSIS — R112 Nausea with vomiting, unspecified: Secondary | ICD-10-CM

## 2013-07-21 DIAGNOSIS — K7460 Unspecified cirrhosis of liver: Secondary | ICD-10-CM | POA: Insufficient documentation

## 2013-07-21 MED ORDER — ONDANSETRON 4 MG PO TBDP: 4.0000 mg | ORAL_TABLET | Freq: Three times a day (TID) | ORAL | Status: DC | PRN

## 2013-07-21 MED ORDER — CHOLESTYRAMINE 4 G PO PACK: 1.0000 | PACK | Freq: Two times a day (BID) | ORAL | Status: DC

## 2013-07-21 MED ORDER — SACCHAROMYCES BOULARDII 250 MG PO CAPS: 250.0000 mg | ORAL_CAPSULE | Freq: Two times a day (BID) | ORAL | Status: DC

## 2013-07-21 NOTE — Progress Notes (Signed)
Patient ID: Jamie Allen, female   DOB: 08-18-1971, 42 y.o.   MRN: [id number] Patient Demographics  Jamie Allen, is a 42 y.o. female  GLO:[id number]  JOA:[id number]  DOB - 18-Oct-1971  Chief Complaint  Patient presents with  . Follow-up  . Abdominal Pain  . Diarrhea        Subjective:   Jamie Allen today is here for a follow up visit for labs and imaging results The patient is a 42 year old female with transaminitis, complained of ongoing intermittent abdominal pain, right-sided, nausea vomiting and diarrhea for years which has worsened in the last 2 days. Patient states that she has not been able to go to work for last 2 days and feels generalized weakness. The imaging results of the abdominal ultrasound reviewed with the patient, she drinks alcohol daily.   Objective:    Filed Vitals:   07/21/13 1504  BP: 135/92  Pulse: 98  Temp: 98.7 F (37.1 C)  TempSrc: Oral  Resp: 16  Height: 5' 1"  (1.549 m)  Weight: 116 lb (52.617 kg)  SpO2: 97%     ALLERGIES:   Allergies  Allergen Reactions  . Morphine And Related Other (See Comments)    unknown    PAST MEDICAL HISTORY: Past Medical History  Diagnosis Date  . Hypertension   . Ovarian cyst     MEDICATIONS AT HOME: Prior to Admission medications   Medication Sig Start Date End Date Taking? Authorizing Provider  Aspirin-Salicylamide-Caffeine (BC HEADACHE POWDER PO) Take 2 packets by mouth as needed. packet    Historical Provider, MD  cholestyramine Lucrezia Starch) 4 G packet Take 1 packet by mouth 2 (two) times daily with a meal. 07/21/13   Ripudeep Krystal Eaton, MD  dicyclomine (BENTYL) 10 MG capsule Take 1 capsule (10 mg total) by mouth 4 (four) times daily -  before meals and at bedtime. 07/07/13   Nishant Dhungel, MD  ondansetron (ZOFRAN ODT) 4 MG disintegrating tablet Take 1 tablet (4 mg total) by mouth every 8 (eight) hours as needed for nausea. 07/21/13   Ripudeep Krystal Eaton, MD  saccharomyces boulardii (FLORASTOR) 250 MG capsule  Take 1 capsule (250 mg total) by mouth 2 (two) times daily. 07/21/13   Ripudeep Krystal Eaton, MD     Exam  General appearance :Awake, alert, NAD, Speech Clear.  HEENT: Atraumatic and Normocephalic, PERLA Neck: supple, no JVD. No cervical lymphadenopathy.  Chest: Clear to auscultation bilaterally, no wheezing, rales or rhonchi CVS: S1 S2 regular, no murmurs.  Abdomen: soft mild tenderness in the right upper quadrant, no rigidity or guarding.  Extremities: no cyanosis or clubbing, B/L Lower Ext shows no edema Neurology: Awake alert, and oriented X 3, CN II-XII intact, Non focal Skin: No Rash or lesions Wounds:N/A    Data Review   Basic Metabolic Panel: No results found for this basename: NA, K, CL, CO2, GLUCOSE, BUN, CREATININE, CALCIUM, MG, PHOS,  in the last 168 hours Liver Function Tests: No results found for this basename: AST, ALT, ALKPHOS, BILITOT, PROT, ALBUMIN,  in the last 168 hours  CBC: No results found for this basename: WBC, NEUTROABS, HGB, HCT, MCV, PLT,  in the last 168 hours  ------------------------------------------------------------------------------------------------------------------ No results found for this basename: HGBA1C,  in the last 72 hours ------------------------------------------------------------------------------------------------------------------ No results found for this basename: CHOL, HDL, LDLCALC, TRIG, CHOLHDL, LDLDIRECT,  in the last 72 hours ------------------------------------------------------------------------------------------------------------------ No results found for this basename: TSH, T4TOTAL, FREET3, T3FREE, THYROIDAB,  in the last 72 hours ------------------------------------------------------------------------------------------------------------------ No results found for  this basename: VITAMINB12, FOLATE, FERRITIN, TIBC, IRON, RETICCTPCT,  in the last 72 hours  Coagulation profile  No results found for this basename: INR, PROTIME,   in the last 168 hours    Assessment & Plan   Active Problems: Transaminitis with only cirrhosis - Explained to the patient strongly that she needs to stop alcohol immediately - HIV, negative, hep C antibody negative, TSH within normal range - Ordered complete hepatitis panel, ceruloplasmin, alpha antitrypsin, ferritin  Macrocytic anemia: Likely secondary to alcohol - Ordered B12 and folate  Chronic nausea, vomiting and diarrhea: ? Malabsorption versus Rule out IBD/ IBS, celiac disease, several bowel movements in a day - Ordered complete celiac panel, CT abdomen and pelvis with contrast -  Continue Bentyl -Ambulatory referral to GI sent - Placed on Questran , Florastor,  zofran as needed  Follow-up in 4 weeks      RAI,RIPUDEEP M.D. 07/21/2013, 3:30 PM

## 2013-07-21 NOTE — Progress Notes (Signed)
Pt is here with c/o ongoing ntermit sharp r abdominal pain with nausea,vomiting and diarrhea x 2 dys Unable to keep food/fluids down,weakness vss

## 2013-07-22 LAB — ACUTE HEP PANEL AND HEP B SURFACE AB
HCV Ab: NEGATIVE
Hep B C IgM: NEGATIVE
Hepatitis B Surface Ag: NEGATIVE

## 2013-07-22 LAB — GLIA (IGA/G) + TTG IGA
Gliadin IgA: 2.1 U/mL (ref <20)
Gliadin IgG: 5 U/mL (ref <20)
Tissue Transglutaminase Ab, IgA: 2.4 U/mL (ref <20)

## 2013-07-22 LAB — FOLATE: Folate: 1.8 ng/mL — ABNORMAL LOW

## 2013-07-23 LAB — ALPHA-1-ANTITRYPSIN: A-1 Antitrypsin, Ser: 126 mg/dL (ref 90–200)

## 2013-07-25 ENCOUNTER — Ambulatory Visit: Payer: Self-pay

## 2013-07-25 ENCOUNTER — Ambulatory Visit (HOSPITAL_COMMUNITY)
Admission: RE | Admit: 2013-07-25 | Discharge: 2013-07-25 | Disposition: A | Discharge status: Home / Self Care | Payer: No Typology Code available for payment source | Source: Ambulatory Visit | Attending: Internal Medicine | Admitting: Internal Medicine

## 2013-07-25 DIAGNOSIS — R112 Nausea with vomiting, unspecified: Secondary | ICD-10-CM | POA: Insufficient documentation

## 2013-07-25 DIAGNOSIS — K7460 Unspecified cirrhosis of liver: Secondary | ICD-10-CM | POA: Insufficient documentation

## 2013-07-25 DIAGNOSIS — R197 Diarrhea, unspecified: Secondary | ICD-10-CM

## 2013-07-25 MED ORDER — IOHEXOL 300 MG/ML  SOLN
80.0000 mL | Freq: Once | INTRAMUSCULAR | Status: AC | PRN
  Administered 2013-07-25: 80 mL via INTRAVENOUS

## 2013-07-26 ENCOUNTER — Other Ambulatory Visit (HOSPITAL_COMMUNITY): Payer: Self-pay

## 2013-08-03 ENCOUNTER — Ambulatory Visit (INDEPENDENT_AMBULATORY_CARE_PROVIDER_SITE_OTHER): Payer: No Typology Code available for payment source | Admitting: Obstetrics & Gynecology

## 2013-08-03 ENCOUNTER — Encounter: Payer: Self-pay | Admitting: Obstetrics & Gynecology

## 2013-08-03 VITALS — BP 132/83 | HR 95 | Temp 97.1°F | Ht 61.0 in | Wt 117.0 lb

## 2013-08-03 DIAGNOSIS — Z01419 Encounter for gynecological examination (general) (routine) without abnormal findings: Secondary | ICD-10-CM

## 2013-08-03 NOTE — Patient Instructions (Signed)
Preventive Care for Adults, Female A healthy lifestyle and preventive care can promote health and wellness. Preventive health guidelines for women include the following key practices.  A routine yearly physical is a good way to check with your caregiver about your health and preventive screening. It is a chance to share any concerns and updates on your health, and to receive a thorough exam.  Visit your dentist for a routine exam and preventive care every 6 months. Brush your teeth twice a day and floss once a day. Good oral hygiene prevents tooth decay and gum disease.  The frequency of eye exams is based on your age, health, family medical history, use of contact lenses, and other factors. Follow your caregiver's recommendations for frequency of eye exams.  Eat a healthy diet. Foods like vegetables, fruits, whole grains, low-fat dairy products, and lean protein foods contain the nutrients you need without too many calories. Decrease your intake of foods high in solid fats, added sugars, and salt. Eat the right amount of calories for you.Get information about a proper diet from your caregiver, if necessary.  Regular physical exercise is one of the most important things you can do for your health. Most adults should get at least 150 minutes of moderate-intensity exercise (any activity that increases your heart rate and causes you to sweat) each week. In addition, most adults need muscle-strengthening exercises on 2 or more days a week.  Maintain a healthy weight. The body mass index (BMI) is a screening tool to identify possible weight problems. It provides an estimate of body fat based on height and weight. Your caregiver can help determine your BMI, and can help you achieve or maintain a healthy weight.For adults 20 years and older:  A BMI below 18.5 is considered underweight.  A BMI of 18.5 to 24.9 is normal.  A BMI of 25 to 29.9 is considered overweight.  A BMI of 30 and above is  considered obese.  Maintain normal blood lipids and cholesterol levels by exercising and minimizing your intake of saturated fat. Eat a balanced diet with plenty of fruit and vegetables. Blood tests for lipids and cholesterol should begin at age 41 and be repeated every 5 years. If your lipid or cholesterol levels are high, you are over 50, or you are at high risk for heart disease, you may need your cholesterol levels checked more frequently.Ongoing high lipid and cholesterol levels should be treated with medicines if diet and exercise are not effective.  If you smoke, find out from your caregiver how to quit. If you do not use tobacco, do not start.  If you are pregnant, do not drink alcohol. If you are breastfeeding, be very cautious about drinking alcohol. If you are not pregnant and choose to drink alcohol, do not exceed 1 drink per day. One drink is considered to be 12 ounces (355 mL) of beer, 5 ounces (148 mL) of wine, or 1.5 ounces (44 mL) of liquor.  Avoid use of street drugs. Do not share needles with anyone. Ask for help if you need support or instructions about stopping the use of drugs.  High blood pressure causes heart disease and increases the risk of stroke. Your blood pressure should be checked at least every 1 to 2 years. Ongoing high blood pressure should be treated with medicines if weight loss and exercise are not effective.  If you are 65 to 43 years old, ask your caregiver if you should take aspirin to prevent strokes.  Diabetes  screening involves taking a blood sample to check your fasting blood sugar level. This should be done once every 3 years, after age 39, if you are within normal weight and without risk factors for diabetes. Testing should be considered at a younger age or be carried out more frequently if you are overweight and have at least 1 risk factor for diabetes.  Breast cancer screening is essential preventive care for women. You should practice "breast  self-awareness." This means understanding the normal appearance and feel of your breasts and may include breast self-examination. Any changes detected, no matter how small, should be reported to a caregiver. Women in their 29s and 30s should have a clinical breast exam (CBE) by a caregiver as part of a regular health exam every 1 to 3 years. After age 77, women should have a CBE every year. Starting at age 53, women should consider having a mammography (breast X-ray test) every year. Women who have a family history of breast cancer should talk to their caregiver about genetic screening. Women at a high risk of breast cancer should talk to their caregivers about having magnetic resonance imaging (MRI) and a mammography every year.  The Pap test is a screening test for cervical cancer. A Pap test can show cell changes on the cervix that might become cervical cancer if left untreated. A Pap test is a procedure in which cells are obtained and examined from the lower end of the uterus (cervix).  Women should have a Pap test starting at age 57.  Between ages 64 and 75, Pap tests should be repeated every 2 years.  Beginning at age 12, you should have a Pap test every 3 years as long as the past 3 Pap tests have been normal.  Some women have medical problems that increase the chance of getting cervical cancer. Talk to your caregiver about these problems. It is especially important to talk to your caregiver if a new problem develops soon after your last Pap test. In these cases, your caregiver may recommend more frequent screening and Pap tests.  The above recommendations are the same for women who have or have not gotten the vaccine for human papillomavirus (HPV).  If you had a hysterectomy for a problem that was not cancer or a condition that could lead to cancer, then you no longer need Pap tests. Even if you no longer need a Pap test, a regular exam is a good idea to make sure no other problems are  starting.  If you are between ages 70 and 86, and you have had normal Pap tests going back 10 years, you no longer need Pap tests. Even if you no longer need a Pap test, a regular exam is a good idea to make sure no other problems are starting.  If you have had past treatment for cervical cancer or a condition that could lead to cancer, you need Pap tests and screening for cancer for at least 20 years after your treatment.  If Pap tests have been discontinued, risk factors (such as a new sexual partner) need to be reassessed to determine if screening should be resumed.  The HPV test is an additional test that may be used for cervical cancer screening. The HPV test looks for the virus that can cause the cell changes on the cervix. The cells collected during the Pap test can be tested for HPV. The HPV test could be used to screen women aged 73 years and older, and should  be used in women of any age who have unclear Pap test results. After the age of 14, women should have HPV testing at the same frequency as a Pap test.  Colorectal cancer can be detected and often prevented. Most routine colorectal cancer screening begins at the age of 71 and continues through age 56. However, your caregiver may recommend screening at an earlier age if you have risk factors for colon cancer. On a yearly basis, your caregiver may provide home test kits to check for hidden blood in the stool. Use of a small camera at the end of a tube, to directly examine the colon (sigmoidoscopy or colonoscopy), can detect the earliest forms of colorectal cancer. Talk to your caregiver about this at age 46, when routine screening begins. Direct examination of the colon should be repeated every 5 to 10 years through age 60, unless early forms of pre-cancerous polyps or small growths are found.  Hepatitis C blood testing is recommended for all people born from [birth year] through 1965 and any individual with known risks for hepatitis C.  Practice  safe sex. Use condoms and avoid high-risk sexual practices to reduce the spread of sexually transmitted infections (STIs). STIs include gonorrhea, chlamydia, syphilis, trichomonas, herpes, HPV, and human immunodeficiency virus (HIV). Herpes, HIV, and HPV are viral illnesses that have no cure. They can result in disability, cancer, and death. Sexually active women aged 73 and younger should be checked for chlamydia. Older women with new or multiple partners should also be tested for chlamydia. Testing for other STIs is recommended if you are sexually active and at increased risk.  Osteoporosis is a disease in which the bones lose minerals and strength with aging. This can result in serious bone fractures. The risk of osteoporosis can be identified using a bone density scan. Women ages 77 and over and women at risk for fractures or osteoporosis should discuss screening with their caregivers. Ask your caregiver whether you should take a calcium supplement or vitamin D to reduce the rate of osteoporosis.  Menopause can be associated with physical symptoms and risks. Hormone replacement therapy is available to decrease symptoms and risks. You should talk to your caregiver about whether hormone replacement therapy is right for you.  Use sunscreen with sun protection factor (SPF) of 30 or more. Apply sunscreen liberally and repeatedly throughout the day. You should seek shade when your shadow is shorter than you. Protect yourself by wearing long sleeves, pants, a wide-brimmed hat, and sunglasses year round, whenever you are outdoors.  Once a month, do a whole body skin exam, using a mirror to look at the skin on your back. Notify your caregiver of new moles, moles that have irregular borders, moles that are larger than a pencil eraser, or moles that have changed in shape or color.  Stay current with required immunizations.  Influenza. You need a dose every fall (or winter). The composition of the flu vaccine  changes each year, so being vaccinated once is not enough.  Pneumococcal polysaccharide. You need 1 to 2 doses if you smoke cigarettes or if you have certain chronic medical conditions. You need 1 dose at age 21 (or older) if you have never been vaccinated.  Tetanus, diphtheria, pertussis (Tdap, Td). Get 1 dose of Tdap vaccine if you are younger than age 51, are over 64 and have contact with an infant, are a Dietitian, are pregnant, or simply want to be protected from whooping cough. After that, you need a Td  booster dose every 10 years. Consult your caregiver if you have not had at least 3 tetanus and diphtheria-containing shots sometime in your life or have a deep or dirty wound.  HPV. You need this vaccine if you are a woman age 27 or younger. The vaccine is given in 3 doses over 6 months.  Measles, mumps, rubella (MMR). You need at least 1 dose of MMR if you were born in 1957 or later. You may also need a second dose.  Meningococcal. If you are age 27 to 36 and a first-year college student living in a residence hall, or have one of several medical conditions, you need to get vaccinated against meningococcal disease. You may also need additional booster doses.  Zoster (shingles). If you are age 49 or older, you should get this vaccine.  Varicella (chickenpox). If you have never had chickenpox or you were vaccinated but received only 1 dose, talk to your caregiver to find out if you need this vaccine.  Hepatitis A. You need this vaccine if you have a specific risk factor for hepatitis A virus infection or you simply wish to be protected from this disease. The vaccine is usually given as 2 doses, 6 to 18 months apart.  Hepatitis B. You need this vaccine if you have a specific risk factor for hepatitis B virus infection or you simply wish to be protected from this disease. The vaccine is given in 3 doses, usually over 6 months. Preventive Services / Frequency Ages 45 to 32  Blood  pressure check.** / Every 1 to 2 years.  Lipid and cholesterol check.** / Every 5 years beginning at age 38.  Clinical breast exam.** / Every 3 years for women in their 63s and 73s.  Pap test.** / Every 2 years from ages 17 through 19. Every 3 years starting at age 57 through age 53 or 8 with a history of 3 consecutive normal Pap tests.  HPV screening.** / Every 3 years from ages 45 through ages 12 to 27 with a history of 3 consecutive normal Pap tests.  Hepatitis C blood test.** / For any individual with known risks for hepatitis C.  Skin self-exam. / Monthly.  Influenza immunization.** / Every year.  Pneumococcal polysaccharide immunization.** / 1 to 2 doses if you smoke cigarettes or if you have certain chronic medical conditions.  Tetanus, diphtheria, pertussis (Tdap, Td) immunization. / A one-time dose of Tdap vaccine. After that, you need a Td booster dose every 10 years.  HPV immunization. / 3 doses over 6 months, if you are 54 and younger.  Measles, mumps, rubella (MMR) immunization. / You need at least 1 dose of MMR if you were born in 1957 or later. You may also need a second dose.  Meningococcal immunization. / 1 dose if you are age 25 to 2 and a first-year college student living in a residence hall, or have one of several medical conditions, you need to get vaccinated against meningococcal disease. You may also need additional booster doses.  Varicella immunization.** / Consult your caregiver.  Hepatitis A immunization.** / Consult your caregiver. 2 doses, 6 to 18 months apart.  Hepatitis B immunization.** / Consult your caregiver. 3 doses usually over 6 months. Ages 3 to 17  Blood pressure check.** / Every 1 to 2 years.  Lipid and cholesterol check.** / Every 5 years beginning at age 26.  Clinical breast exam.** / Every year after age 48.  Mammogram.** / Every year beginning at age 74  and continuing for as long as you are in good health. Consult with your  caregiver.  Pap test.** / Every 3 years starting at age 55 through age 59 or 36 with a history of 3 consecutive normal Pap tests.  HPV screening.** / Every 3 years from ages 60 through ages 17 to 64 with a history of 3 consecutive normal Pap tests.  Fecal occult blood test (FOBT) of stool. / Every year beginning at age 85 and continuing until age 49. You may not need to do this test if you get a colonoscopy every 10 years.  Flexible sigmoidoscopy or colonoscopy.** / Every 5 years for a flexible sigmoidoscopy or every 10 years for a colonoscopy beginning at age 95 and continuing until age 24.  Hepatitis C blood test.** / For all people born from [birth year] through 1966 and any individual with known risks for hepatitis C.  Skin self-exam. / Monthly.  Influenza immunization.** / Every year.  Pneumococcal polysaccharide immunization.** / 1 to 2 doses if you smoke cigarettes or if you have certain chronic medical conditions.  Tetanus, diphtheria, pertussis (Tdap, Td) immunization.** / A one-time dose of Tdap vaccine. After that, you need a Td booster dose every 10 years.  Measles, mumps, rubella (MMR) immunization. / You need at least 1 dose of MMR if you were born in 1957 or later. You may also need a second dose.  Varicella immunization.** / Consult your caregiver.  Meningococcal immunization.** / Consult your caregiver.  Hepatitis A immunization.** / Consult your caregiver. 2 doses, 6 to 18 months apart.  Hepatitis B immunization.** / Consult your caregiver. 3 doses, usually over 6 months. Ages 71 and over  Blood pressure check.** / Every 1 to 2 years.  Lipid and cholesterol check.** / Every 5 years beginning at age 2.  Clinical breast exam.** / Every year after age 6.  Mammogram.** / Every year beginning at age 24 and continuing for as long as you are in good health. Consult with your caregiver.  Pap test.** / Every 3 years starting at age 21 through age 80 or 53 with a 3  consecutive normal Pap tests. Testing can be stopped between 65 and 70 with 3 consecutive normal Pap tests and no abnormal Pap or HPV tests in the past 10 years.  HPV screening.** / Every 3 years from ages 47 through ages 62 or 34 with a history of 3 consecutive normal Pap tests. Testing can be stopped between 65 and 70 with 3 consecutive normal Pap tests and no abnormal Pap or HPV tests in the past 10 years.  Fecal occult blood test (FOBT) of stool. / Every year beginning at age 35 and continuing until age 68. You may not need to do this test if you get a colonoscopy every 10 years.  Flexible sigmoidoscopy or colonoscopy.** / Every 5 years for a flexible sigmoidoscopy or every 10 years for a colonoscopy beginning at age 5 and continuing until age 34.  Hepatitis C blood test.** / For all people born from [birth year] through 1966 and any individual with known risks for hepatitis C.  Osteoporosis screening.** / A one-time screening for women ages 70 and over and women at risk for fractures or osteoporosis.  Skin self-exam. / Monthly.  Influenza immunization.** / Every year.  Pneumococcal polysaccharide immunization.** / 1 dose at age 58 (or older) if you have never been vaccinated.  Tetanus, diphtheria, pertussis (Tdap, Td) immunization. / A one-time dose of Tdap vaccine if you are over  27 and have contact with an infant, are a Dietitian, or simply want to be protected from whooping cough. After that, you need a Td booster dose every 10 years.  Varicella immunization.** / Consult your caregiver.  Meningococcal immunization.** / Consult your caregiver.  Hepatitis A immunization.** / Consult your caregiver. 2 doses, 6 to 18 months apart.  Hepatitis B immunization.** / Check with your caregiver. 3 doses, usually over 6 months. ** Family history and personal history of risk and conditions may change your caregiver's recommendations. Document Released: 12/02/2001 Document Revised: 12/29/2011  Document Reviewed: 03/03/2011 [facility] Patient Information 2014 [place], [place].

## 2013-08-03 NOTE — Progress Notes (Signed)
  Subjective:     Jamie Allen is a 42 y.o. female and is here for a comprehensive gynecologic exam. She was told she had an ovarian cyst but had recent imaging on 07/21/13 that showed normal uterus and adnexa.  She has elevated LFTs and is being evaluated for autoimmune disorders. She is very stressed out.  She reports regular menstrual cycles, no intermenstrual bleeding, no abnormal discharge or other GYN concerns.   History   Social History  . Marital Status: Divorced    Spouse Name: N/A    Number of Children: N/A  . Years of Education: N/A   Occupational History  . Not on file.   Social History Main Topics  . Smoking status: Current Some Day Smoker -- 0.50 packs/day    Types: Cigarettes  . Smokeless tobacco: Not on file  . Alcohol Use: Yes     Comment: occassionally  . Drug Use: No  . Sexual Activity: Yes    Birth Control/ Protection: None   Other Topics Concern  . Not on file   Social History Narrative  . No narrative on file   Health Maintenance  Topic Date Due  . Pap Smear  10/15/1988  . Tetanus/tdap  10/15/1989  . Influenza Vaccine  05/20/2013    The following portions of the patient's history were reviewed and updated as appropriate: allergies, current medications, past family history, past medical history, past social history, past surgical history and problem list.  Review of Systems Pertinent items are noted in HPI.   Objective:   BP 132/83  Pulse 95  Temp(Src) 97.1 F (36.2 C)  Ht 5' 1"  (1.549 m)  Wt 117 lb (53.071 kg)  BMI 22.12 kg/m2  LMP 07/16/2013 GENERAL: Well-developed, well-nourished female in no acute distress.  HEENT: Normocephalic, atraumatic. Sclerae anicteric.  NECK: Supple. Normal thyroid.  LUNGS: Clear to auscultation bilaterally.  HEART: Regular rate and rhythm. BREASTS: Symmetric in size. No masses, skin changes, nipple drainage, or lymphadenopathy. ABDOMEN: Soft, nontender, nondistended. No organomegaly. PELVIC: Normal external  female genitalia. Vagina is pink and rugated.  Normal discharge. Normal cervix contour. Pap smear obtained. Uterus is normal in size. No adnexal mass or tenderness.  EXTREMITIES: No cyanosis, clubbing, or edema, 2+ distal pulses.   Assessment:   Annual gynecologic exam   Plan:   Pap done, will follow up results and manage accordingly. Mammogram scholarship application given to patient; she will hopefully complete this and get scheduled for a mammogram Routine preventative health maintenance measures emphasized

## 2013-08-08 ENCOUNTER — Other Ambulatory Visit: Payer: Self-pay | Admitting: Obstetrics & Gynecology

## 2013-08-08 DIAGNOSIS — Z1231 Encounter for screening mammogram for malignant neoplasm of breast: Secondary | ICD-10-CM

## 2013-08-10 ENCOUNTER — Emergency Department (HOSPITAL_COMMUNITY)
Admission: EM | Admit: 2013-08-10 | Discharge: 2013-08-11 | Disposition: A | Discharge status: Home / Self Care | Payer: No Typology Code available for payment source | Attending: Emergency Medicine | Admitting: Emergency Medicine

## 2013-08-10 ENCOUNTER — Encounter (HOSPITAL_COMMUNITY): Payer: Self-pay | Admitting: Emergency Medicine

## 2013-08-10 DIAGNOSIS — I10 Essential (primary) hypertension: Secondary | ICD-10-CM | POA: Insufficient documentation

## 2013-08-10 DIAGNOSIS — R1011 Right upper quadrant pain: Secondary | ICD-10-CM | POA: Insufficient documentation

## 2013-08-10 DIAGNOSIS — R112 Nausea with vomiting, unspecified: Secondary | ICD-10-CM | POA: Insufficient documentation

## 2013-08-10 DIAGNOSIS — Z79899 Other long term (current) drug therapy: Secondary | ICD-10-CM | POA: Insufficient documentation

## 2013-08-10 DIAGNOSIS — R7402 Elevation of levels of lactic acid dehydrogenase (LDH): Secondary | ICD-10-CM | POA: Insufficient documentation

## 2013-08-10 DIAGNOSIS — R197 Diarrhea, unspecified: Secondary | ICD-10-CM | POA: Insufficient documentation

## 2013-08-10 DIAGNOSIS — E876 Hypokalemia: Secondary | ICD-10-CM | POA: Insufficient documentation

## 2013-08-10 DIAGNOSIS — F17200 Nicotine dependence, unspecified, uncomplicated: Secondary | ICD-10-CM | POA: Insufficient documentation

## 2013-08-10 DIAGNOSIS — R7401 Elevation of levels of liver transaminase levels: Secondary | ICD-10-CM | POA: Insufficient documentation

## 2013-08-10 DIAGNOSIS — K7460 Unspecified cirrhosis of liver: Secondary | ICD-10-CM | POA: Insufficient documentation

## 2013-08-10 LAB — CBC WITH DIFFERENTIAL/PLATELET
Basophils Relative: 1 % (ref 0–1)
Eosinophils Absolute: 0.2 10*3/uL (ref 0.0–0.7)
Eosinophils Relative: 3 % (ref 0–5)
HCT: 38.7 % (ref 36.0–46.0)
Hemoglobin: 13.6 g/dL (ref 12.0–15.0)
Lymphocytes Relative: 40 % (ref 12–46)
Lymphs Abs: 1.9 10*3/uL (ref 0.7–4.0)
MCH: 38.2 pg — ABNORMAL HIGH (ref 26.0–34.0)
MCHC: 35.1 g/dL (ref 30.0–36.0)
MCV: 108.7 fL — ABNORMAL HIGH (ref 78.0–100.0)
Monocytes Absolute: 0.3 10*3/uL (ref 0.1–1.0)
Monocytes Relative: 7 % (ref 3–12)
Neutro Abs: 2.4 10*3/uL (ref 1.7–7.7)
Neutrophils Relative %: 49 % (ref 43–77)

## 2013-08-10 LAB — COMPREHENSIVE METABOLIC PANEL
ALT: 63 U/L — ABNORMAL HIGH (ref 0–35)
AST: 96 U/L — ABNORMAL HIGH (ref 0–37)
Alkaline Phosphatase: 97 U/L (ref 39–117)
CO2: 22 mEq/L (ref 19–32)
Calcium: 9.6 mg/dL (ref 8.4–10.5)
Chloride: 105 mEq/L (ref 96–112)
GFR calc non Af Amer: >90 mL/min (ref >90)
Glucose, Bld: 99 mg/dL (ref 70–99)
Potassium: 3.4 mEq/L — ABNORMAL LOW (ref 3.5–5.1)
Total Bilirubin: 0.3 mg/dL (ref 0.3–1.2)

## 2013-08-10 NOTE — ED Notes (Signed)
Presents with 3 years of abdominal pain, nausea, vomiting, diarrhea. Over the past few months has had multiple tests, CTs and MRI completed and not received a diagnosis yet. Multiple lab work. Today reports, "I just can't take it anymore. I don't know what is going on now with me. I go for a official diagnosis nov 3rd. I just can't take it anymore" pt is tearful, symptoms are affecting her job, her activities of daily living. Reports right sided upper abdominal pain described as constant and "annoying" associated with bloating.

## 2013-08-10 NOTE — ED Notes (Signed)
Pt states she has been experiencing pain on rt upper quadrant for past 2 years. Pt states she has been back in forth to hospitals and getting testing with no resolve. Pt states she has n/v everyday w/ diarrhea. Pt states she has blood in stool today. Vomits greenish/yellow mucus. 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 Pt state pain radiates to back but only on right side.

## 2013-08-11 MED ORDER — ONDANSETRON HCL 4 MG/2ML IJ SOLN
4.0000 mg | Freq: Once | INTRAMUSCULAR | Status: AC
  Administered 2013-08-11: 4 mg via INTRAVENOUS
  Filled 2013-08-11 (×2): qty 2

## 2013-08-11 MED ORDER — POTASSIUM CHLORIDE CRYS ER 20 MEQ PO TBCR
40.0000 meq | EXTENDED_RELEASE_TABLET | Freq: Once | ORAL | Status: DC
  Filled 2013-08-11: qty 2

## 2013-08-11 MED ORDER — SODIUM CHLORIDE 0.9 % IV SOLN
1000.0000 mL | INTRAVENOUS | Status: DC
  Administered 2013-08-11: 1000 mL via INTRAVENOUS

## 2013-08-11 MED ORDER — POTASSIUM CHLORIDE 10 MEQ/100ML IV SOLN
10.0000 meq | Freq: Once | INTRAVENOUS | Status: AC
  Administered 2013-08-11: 10 meq via INTRAVENOUS
  Filled 2013-08-11: qty 100

## 2013-08-11 MED ORDER — SODIUM CHLORIDE 0.9 % IV SOLN
1000.0000 mL | Freq: Once | INTRAVENOUS | Status: AC
  Administered 2013-08-11: 1000 mL via INTRAVENOUS

## 2013-08-11 MED ORDER — KETOROLAC TROMETHAMINE 30 MG/ML IJ SOLN
30.0000 mg | Freq: Once | INTRAMUSCULAR | Status: AC
  Administered 2013-08-11: 30 mg via INTRAVENOUS
  Filled 2013-08-11: qty 1

## 2013-08-11 NOTE — ED Provider Notes (Signed)
CSN: [id number]     Arrival date & time 08/10/13  2050 History   First MD Initiated Contact with Patient 08/11/13 0041     Chief Complaint  Patient presents with  . Abdominal Pain   (Consider location/radiation/quality/duration/timing/severity/associated sxs/prior Treatment) Patient is a 42 y.o. female presenting with abdominal pain. The history is provided by the patient.  Abdominal Pain She has been having right upper abdominal pain intermittently for the last several years and has been gradually getting worse. For the last year, she has had problems with nausea and diarrhea. Symptoms of been getting progressively worse and she states that it got to the point today where she just couldn't take it and decided to come into the ED. She's also been having nausea and vomiting. She states that her stools have been having a greasy appearance. Pain is moderately severe and she rates it at 8/10. She's been taking ibuprofen with little relief. She did see a physician who prescribed Questran and Zofran but she is not gotten the prescriptions filled. She has been taking Bentyl with no relief. Pain is worse when she stands and walks, better if she lays still. Denies fever, chills, sweats. She denies arthralgias or myalgias. She did have 2 beers today and states that she drinks a couple of beers about 3 or 4 times a week.  Past Medical History  Diagnosis Date  . Hypertension    Past Surgical History  Procedure Laterality Date  . Tonsillectomy     History reviewed. No pertinent family history. History  Substance Use Topics  . Smoking status: Current Some Day Smoker -- 0.50 packs/day    Types: Cigarettes  . Smokeless tobacco: Not on file  . Alcohol Use: Yes     Comment: occassionally   OB History   Grav Para Term Preterm Abortions TAB SAB Ect Mult Living                 Review of Systems  Gastrointestinal: Positive for abdominal pain.  All other systems reviewed and are  negative.    Allergies  Iodine and Morphine and related  Home Medications   Current Outpatient Rx  Name  Route  Sig  Dispense  Refill  . dicyclomine (BENTYL) 10 MG capsule   Oral   Take 10 mg by mouth daily.         Marland Kitchen esomeprazole (NEXIUM) 10 MG packet   Oral   Take 10 mg by mouth daily before breakfast.         . ibuprofen (ADVIL,MOTRIN) 200 MG tablet   Oral   Take 200 mg by mouth every 6 (six) hours as needed for pain.          BP 147/94  Pulse 90  Temp(Src) 97.7 F (36.5 C) (Oral)  Resp 18  Ht 5' 1"  (1.549 m)  Wt 117 lb 4.8 oz (53.207 kg)  BMI 22.18 kg/m2  SpO2 98%  LMP 07/16/2013 Physical Exam  Nursing note and vitals reviewed.  42 year old female, resting comfortably and in no acute distress. Vital signs are significant for hypertension with blood pressure 147/94. Oxygen saturation is 98%, which is normal. Head is normocephalic and atraumatic. PERRLA, EOMI. Oropharynx is clear. Neck is nontender and supple without adenopathy or JVD. Back is nontender and there is no CVA tenderness. Lungs are clear without rales, wheezes, or rhonchi. Chest is nontender. Heart has regular rate and rhythm without murmur. Abdomen is soft, flat, with moderate right upper quadrant tenderness. There  is a negative Murphy sign. There are no masses or hepatosplenomegaly and peristalsis is normoactive. Extremities have no cyanosis or edema, full range of motion is present. Skin is warm and dry without rash. Neurologic: Mental status is normal, cranial nerves are intact, there are no motor or sensory deficits.  ED Course  Procedures (including critical care time) Labs Review Results for orders placed during the hospital encounter of 08/10/13  CBC WITH DIFFERENTIAL      Result Value Range   WBC 4.9  4.0 - 10.5 K/uL   RBC 3.56 (*) 3.87 - 5.11 MIL/uL   Hemoglobin 13.6  12.0 - 15.0 g/dL   HCT 38.7  36.0 - 46.0 %   MCV 108.7 (*) 78.0 - 100.0 fL   MCH 38.2 (*) 26.0 - 34.0 pg    MCHC 35.1  30.0 - 36.0 g/dL   RDW 14.7  11.5 - 15.5 %   Platelets 315  150 - 400 K/uL   Neutrophils Relative % 49  43 - 77 %   Neutro Abs 2.4  1.7 - 7.7 K/uL   Lymphocytes Relative 40  12 - 46 %   Lymphs Abs 1.9  0.7 - 4.0 K/uL   Monocytes Relative 7  3 - 12 %   Monocytes Absolute 0.3  0.1 - 1.0 K/uL   Eosinophils Relative 3  0 - 5 %   Eosinophils Absolute 0.2  0.0 - 0.7 K/uL   Basophils Relative 1  0 - 1 %   Basophils Absolute 0.1  0.0 - 0.1 K/uL  COMPREHENSIVE METABOLIC PANEL      Result Value Range   Sodium 141  135 - 145 mEq/L   Potassium 3.4 (*) 3.5 - 5.1 mEq/L   Chloride 105  96 - 112 mEq/L   CO2 22  19 - 32 mEq/L   Glucose, Bld 99  70 - 99 mg/dL   BUN 4 (*) 6 - 23 mg/dL   Creatinine, Ser 0.73  0.50 - 1.10 mg/dL   Calcium 9.6  8.4 - 10.5 mg/dL   Total Protein 7.8  6.0 - 8.3 g/dL   Albumin 4.1  3.5 - 5.2 g/dL   AST 96 (*) 0 - 37 U/L   ALT 63 (*) 0 - 35 U/L   Alkaline Phosphatase 97  39 - 117 U/L   Total Bilirubin 0.3  0.3 - 1.2 mg/dL   GFR calc non Af Amer >90  >90 mL/min   GFR calc Af Amer >90  >90 mL/min  LIPASE, BLOOD      Result Value Range   Lipase 48  11 - 59 U/L    MDM   1. RUQ pain   2. Cirrhosis of liver without mention of alcohol   3. Hypokalemia   4. Elevated transaminase level    Right upper quadrant pain. Old records are reviewed and she had recent ultrasound and CT scan showing evidence of cirrhosis. Today, she has mild elevation of transaminases which has been noted before. Hepatitis studies have been negative. At this point, it seems that her pain is due to her cirrhosis her diarrhea seems consistent with steatorrhea secondary to cirrhosis. She would benefit from pulling her prescription for cholestyramine. Mild hypokalemia is noted and she will be given a dose of oral potassium. She will be given IV hydration and a dose of ketorolac for pain. She does have history of drug abuse, so narcotics will be avoided. Also, with a early cirrhosis, and she needs  to avoid  acetaminophen. Patient is advised of the ultrasound and CT findings suggesting cirrhosis and she is encouraged to abstain from alcohol.  She was unable to tolerate oral potassium she was given intravenous potassium. She is advised to get her prescriptions filled and start taking the medication as prescribed. She is urged to completely abstain from alcohol. Because potassium was only borderline low and she was unable to tolerate oral potassium, no potassium prescription is given. If her diarrhea is controlled, she is not likely to need additional potassium.  Delora Fuel, MD 45/03/88 [id number]

## 2013-08-22 ENCOUNTER — Ambulatory Visit: Payer: No Typology Code available for payment source | Attending: Internal Medicine | Admitting: Internal Medicine

## 2013-08-22 VITALS — BP 122/85 | HR 79 | Temp 98.4°F | Resp 16 | Wt 119.0 lb

## 2013-08-22 DIAGNOSIS — R112 Nausea with vomiting, unspecified: Secondary | ICD-10-CM

## 2013-08-22 DIAGNOSIS — R197 Diarrhea, unspecified: Secondary | ICD-10-CM

## 2013-08-22 DIAGNOSIS — K7460 Unspecified cirrhosis of liver: Secondary | ICD-10-CM

## 2013-08-22 NOTE — Progress Notes (Signed)
Patient here for follow up Nausea and vomiting

## 2013-08-22 NOTE — Progress Notes (Signed)
Patient ID: Jamie Allen, female   DOB: Aug 04, 1971, 42 y.o.   MRN: [id number]   HPI: This is a 42 year old female returning to clinic for followup of nausea vomiting diarrhea and nonalcoholic cirrhosis. She was placed on Zofran and cholestyramine and has also changed her diet to avoid wheat, dairy, beef and pork. She states that she is no longer having episodes of vomiting on the Zofran. The cholestyramine have has helped decrease the diarrhea and stool is now no longer mucousy. She feels much better overall.  Allergies  Allergen Reactions  . Iodine Swelling  . Morphine And Related Other (See Comments)    unknown   Past Medical History  Diagnosis Date  . Hypertension    Prior to Admission medications   Medication Sig Start Date End Date Taking? Authorizing Provider  cholestyramine (QUESTRAN) 4 G packet Take 1 packet by mouth 2 (two) times daily with a meal.   Yes Historical Provider, MD  ondansetron (ZOFRAN) 4 MG tablet Take 4 mg by mouth every 8 (eight) hours as needed for nausea.   Yes Historical Provider, MD  dicyclomine (BENTYL) 10 MG capsule Take 10 mg by mouth daily. 07/07/13   Nishant Dhungel, MD  esomeprazole (NEXIUM) 10 MG packet Take 10 mg by mouth daily before breakfast.    Historical Provider, MD   No family history on file. History   Social History  . Marital Status: Divorced    Spouse Name: N/A    Number of Children: N/A  . Years of Education: N/A   Occupational History  . Not on file.   Social History Main Topics  . Smoking status: Current Some Day Smoker -- 0.50 packs/day    Types: Cigarettes  . Smokeless tobacco: Not on file  . Alcohol Use: Yes     Comment: occassionally  . Drug Use: No  . Sexual Activity: Yes    Birth Control/ Protection: None   Other Topics Concern  . Not on file   Social History Narrative  . No narrative on file    Review of Systems ______ Constitutional: Negative for fever, chills, diaphoresis, activity change, appetite  change and fatigue. ____ HENT: Negative for ear pain, nosebleeds, congestion, facial swelling, rhinorrhea, neck pain, neck stiffness and ear discharge.  ____ Eyes: Negative for pain, discharge, redness, itching and visual disturbance. ____ Respiratory: Negative for cough, choking, chest tightness, shortness of breath, wheezing and stridor.  ____ Cardiovascular: Negative for chest pain, palpitations and leg swelling. ____ Gastrointestinal: Negative for Nausea/ Vomiting/ Diarrhea or Consitpation Genitourinary: Negative for dysuria, urgency, frequency, hematuria, flank pain, decreased urine volume, difficulty urinating and dyspareunia. ____ Musculoskeletal: Negative for back pain, joint swelling, arthralgias and gait problem. ________ Neurological: Negative for dizziness, tremors, seizures, syncope, facial asymmetry, speech difficulty, weakness, light-headedness, numbness and headaches. ____ Hematological: Negative for adenopathy. Does not bruise/bleed easily. ____ Psychiatric/Behavioral: Negative for hallucinations, behavioral problems, confusion, dysphoric mood, decreased concentration and agitation. ______   Objective:   Filed Vitals:   08/22/13 0917  BP: 122/85  Pulse: 79  Temp: 98.4 F (36.9 C)  Resp: 16   Filed Weights   08/22/13 0917  Weight: 119 lb (53.978 kg)    Physical Exam ______ Constitutional: Appears well-developed and well-nourished. No distress. ____ HENT: Normocephalic. External right and left ear normal. Oropharynx is clear and moist. ____ Eyes: Conjunctivae and EOM are normal. PERRLA, no scleral icterus. ____ Neck: Normal ROM. Neck supple. No JVD. No tracheal deviation. No thyromegaly. ____ CVS: RRR, S1/S2 +, no  murmurs, no gallops, no carotid bruit.  Pulmonary: Effort and breath sounds normal, no stridor, rhonchi, wheezes, rales.  Abdominal: Soft. BS +,  no distension, tenderness, rebound or guarding. ________ Musculoskeletal: Normal range of motion. No edema and  no tenderness. ____ Neuro: Alert. Normal reflexes, muscle tone coordination. No cranial nerve deficit. Skin: Skin is warm and dry. No rash noted. Not diaphoretic. No erythema. No pallor. ____ Psychiatric: Normal mood and affect. Behavior, judgment, thought content normal. __  Lab Results  Component Value Date   WBC 4.9 08/10/2013   HGB 13.6 08/10/2013   HCT 38.7 08/10/2013   MCV 108.7* 08/10/2013   PLT 315 08/10/2013   Lab Results  Component Value Date   CREATININE 0.73 08/10/2013   BUN 4* 08/10/2013   NA 141 08/10/2013   K 3.4* 08/10/2013   CL 105 08/10/2013   CO2 22 08/10/2013    No results found for this basename: HGBA1C   Lipid Panel  No results found for this basename: chol, trig, hdl, cholhdl, vldl, ldlcalc        Patient Active Problem List   Diagnosis Date Noted  . 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 Diarrhea 07/21/2013  . Nausea with vomiting 07/21/2013  . Abdominal  pain, other specified site 07/07/2013        Assessment and plan: Cirrhosis of the liver: -Hepatitis serologies neg -Check ANA and anti-smooth muscle antibody today -Ceruloplasmin normal -Alpha-1 antitrypsin normal -Appointment with GI as in December  Nausea vomiting diarrhea: -Gliadin antibodies negative -Celiac panel negative -Stool studies negative -Continue current treatment with PPI Zofran and cholestyramine -Again GI followup is scheduled for December   Debbe Odea, MD

## 2013-08-24 NOTE — Progress Notes (Signed)
Quick Note:  Anti sm muscle antibody noted to be elevated- Pt has appt with GI for next month. ______

## 2013-08-25 ENCOUNTER — Other Ambulatory Visit: Payer: Self-pay

## 2013-08-30 ENCOUNTER — Ambulatory Visit (HOSPITAL_COMMUNITY)
Admission: RE | Admit: 2013-08-30 | Discharge: 2013-08-30 | Disposition: A | Discharge status: Home / Self Care | Payer: No Typology Code available for payment source | Source: Ambulatory Visit | Attending: Obstetrics & Gynecology | Admitting: Obstetrics & Gynecology

## 2013-08-30 DIAGNOSIS — Z1231 Encounter for screening mammogram for malignant neoplasm of breast: Secondary | ICD-10-CM | POA: Insufficient documentation

## 2013-08-31 ENCOUNTER — Other Ambulatory Visit: Payer: Self-pay | Admitting: Emergency Medicine

## 2013-08-31 MED ORDER — ONDANSETRON HCL 4 MG PO TABS: 4.0000 mg | ORAL_TABLET | Freq: Three times a day (TID) | ORAL | Status: DC | PRN

## 2013-09-23 ENCOUNTER — Ambulatory Visit: Payer: No Typology Code available for payment source

## 2013-10-20 DIAGNOSIS — K2950 Unspecified chronic gastritis without bleeding: Secondary | ICD-10-CM

## 2013-10-20 HISTORY — DX: Unspecified chronic gastritis without bleeding: K29.50

## 2013-11-10 ENCOUNTER — Emergency Department (HOSPITAL_COMMUNITY): Payer: No Typology Code available for payment source

## 2013-11-10 ENCOUNTER — Encounter (HOSPITAL_COMMUNITY): Payer: Self-pay | Admitting: Emergency Medicine

## 2013-11-10 ENCOUNTER — Emergency Department (HOSPITAL_COMMUNITY)
Admission: EM | Admit: 2013-11-10 | Discharge: 2013-11-11 | Disposition: A | Discharge status: Home / Self Care | Payer: No Typology Code available for payment source | Attending: Emergency Medicine | Admitting: Emergency Medicine

## 2013-11-10 DIAGNOSIS — K7689 Other specified diseases of liver: Secondary | ICD-10-CM | POA: Insufficient documentation

## 2013-11-10 DIAGNOSIS — R112 Nausea with vomiting, unspecified: Secondary | ICD-10-CM | POA: Insufficient documentation

## 2013-11-10 DIAGNOSIS — N390 Urinary tract infection, site not specified: Secondary | ICD-10-CM

## 2013-11-10 DIAGNOSIS — Z8639 Personal history of other endocrine, nutritional and metabolic disease: Secondary | ICD-10-CM | POA: Insufficient documentation

## 2013-11-10 DIAGNOSIS — Z79899 Other long term (current) drug therapy: Secondary | ICD-10-CM | POA: Insufficient documentation

## 2013-11-10 DIAGNOSIS — R109 Unspecified abdominal pain: Secondary | ICD-10-CM

## 2013-11-10 DIAGNOSIS — I10 Essential (primary) hypertension: Secondary | ICD-10-CM | POA: Insufficient documentation

## 2013-11-10 DIAGNOSIS — Z3202 Encounter for pregnancy test, result negative: Secondary | ICD-10-CM | POA: Insufficient documentation

## 2013-11-10 DIAGNOSIS — F17200 Nicotine dependence, unspecified, uncomplicated: Secondary | ICD-10-CM | POA: Insufficient documentation

## 2013-11-10 DIAGNOSIS — Z862 Personal history of diseases of the blood and blood-forming organs and certain disorders involving the immune mechanism: Secondary | ICD-10-CM | POA: Insufficient documentation

## 2013-11-10 HISTORY — DX: Nonalcoholic steatohepatitis (NASH): K75.81

## 2013-11-10 HISTORY — DX: Immunodeficiency, unspecified: D84.9

## 2013-11-10 LAB — COMPREHENSIVE METABOLIC PANEL
ALK PHOS: 131 U/L — AB (ref 39–117)
ALT: 60 U/L — AB (ref 0–35)
AST: 98 U/L — ABNORMAL HIGH (ref 0–37)
Albumin: 4 g/dL (ref 3.5–5.2)
BILIRUBIN TOTAL: 1.2 mg/dL (ref 0.3–1.2)
BUN: 4 mg/dL — AB (ref 6–23)
CHLORIDE: 90 meq/L — AB (ref 96–112)
CO2: 26 mEq/L (ref 19–32)
Calcium: 9.3 mg/dL (ref 8.4–10.5)
Creatinine, Ser: 0.66 mg/dL (ref 0.50–1.10)
GFR calc Af Amer: >90 mL/min (ref >90)
GFR calc non Af Amer: >90 mL/min (ref >90)
Glucose, Bld: 101 mg/dL — ABNORMAL HIGH (ref 70–99)
POTASSIUM: 3.2 meq/L — AB (ref 3.7–5.3)
SODIUM: 134 meq/L — AB (ref 137–147)
TOTAL PROTEIN: 7.4 g/dL (ref 6.0–8.3)

## 2013-11-10 LAB — CBC WITH DIFFERENTIAL/PLATELET
Basophils Absolute: 0 10*3/uL (ref 0.0–0.1)
Basophils Relative: 0 % (ref 0–1)
EOS ABS: 0.1 10*3/uL (ref 0.0–0.7)
Eosinophils Relative: 1 % (ref 0–5)
HCT: 33.2 % — ABNORMAL LOW (ref 36.0–46.0)
HEMOGLOBIN: 12.1 g/dL (ref 12.0–15.0)
Lymphocytes Relative: 19 % (ref 12–46)
Lymphs Abs: 2 10*3/uL (ref 0.7–4.0)
MCH: 38.1 pg — AB (ref 26.0–34.0)
MCHC: 36.4 g/dL — ABNORMAL HIGH (ref 30.0–36.0)
MCV: 104.4 fL — ABNORMAL HIGH (ref 78.0–100.0)
MONOS PCT: 4 % (ref 3–12)
Monocytes Absolute: 0.4 10*3/uL (ref 0.1–1.0)
NEUTROS ABS: 7.9 10*3/uL — AB (ref 1.7–7.7)
NEUTROS PCT: 76 % (ref 43–77)
PLATELETS: 139 10*3/uL — AB (ref 150–400)
RBC: 3.18 MIL/uL — AB (ref 3.87–5.11)
RDW: 16.9 % — ABNORMAL HIGH (ref 11.5–15.5)
WBC: 10.3 10*3/uL (ref 4.0–10.5)

## 2013-11-10 LAB — LIPASE, BLOOD: LIPASE: 29 U/L (ref 11–59)

## 2013-11-10 LAB — POCT PREGNANCY, URINE: Preg Test, Ur: NEGATIVE

## 2013-11-10 MED ORDER — FENTANYL CITRATE 0.05 MG/ML IJ SOLN
50.0000 ug | Freq: Once | INTRAMUSCULAR | Status: AC
  Administered 2013-11-10: 50 ug via RESPIRATORY_TRACT
  Filled 2013-11-10: qty 2

## 2013-11-10 MED ORDER — HYDROMORPHONE HCL PF 1 MG/ML IJ SOLN
1.0000 mg | Freq: Once | INTRAMUSCULAR | Status: AC
  Administered 2013-11-10: 1 mg via INTRAVENOUS
  Filled 2013-11-10: qty 1

## 2013-11-10 MED ORDER — METHYLPREDNISOLONE SODIUM SUCC 125 MG IJ SOLR
125.0000 mg | Freq: Once | INTRAMUSCULAR | Status: AC
  Administered 2013-11-10: 125 mg via INTRAVENOUS
  Filled 2013-11-10: qty 2

## 2013-11-10 MED ORDER — SODIUM CHLORIDE 0.9 % IV BOLUS (SEPSIS)
1000.0000 mL | Freq: Once | INTRAVENOUS | Status: AC
  Administered 2013-11-10: 1000 mL via INTRAVENOUS

## 2013-11-10 MED ORDER — ONDANSETRON HCL 4 MG/2ML IJ SOLN
4.0000 mg | Freq: Once | INTRAMUSCULAR | Status: AC
  Administered 2013-11-10: 4 mg via INTRAVENOUS
  Filled 2013-11-10: qty 2

## 2013-11-10 MED ORDER — DIPHENHYDRAMINE HCL 50 MG/ML IJ SOLN
50.0000 mg | Freq: Once | INTRAMUSCULAR | Status: AC
  Administered 2013-11-10: 50 mg via INTRAVENOUS
  Filled 2013-11-10: qty 1

## 2013-11-10 MED ORDER — ONDANSETRON 8 MG PO TBDP
8.0000 mg | ORAL_TABLET | Freq: Once | ORAL | Status: AC
Start: 2013-11-10 — End: 2013-11-10
  Administered 2013-11-10: 8 mg via ORAL
  Filled 2013-11-10: qty 1

## 2013-11-10 NOTE — ED Notes (Signed)
PIV placed in right AC good blood return and flushed without difficulty--no signs of redness, infiltration Patient very anxious and stated that she wanted PIV site removed PIV site removed, per patient request--catheter intact Attempted another PIV site without success due to patient pulling hand away Will have another member of nursing staff attempt PIV placement

## 2013-11-10 NOTE — ED Notes (Signed)
Pt states that she was recently diagnosed with liver disease; pt states that she has had abdominal pain and N/V/D since Monday; pt states that she is unable to keep fluids down and has not urinated today; pt reports that she is having bile and mucous come out in her vomit and diarrhea; pt complain of bilateral flank pain, bladder pain and "liver" pain

## 2013-11-10 NOTE — ED Provider Notes (Signed)
CSN: [id number]     Arrival date & time 11/10/13  1934 History   First MD Initiated Contact with Patient 11/10/13 2131     Chief Complaint  Patient presents with  . Abdominal Pain   (Consider location/radiation/quality/duration/timing/severity/associated sxs/prior Treatment) HPI Comments: Patient presents to the emergency department with chief complaint of abdominal pain. She states the abdominal pain is located in her right lower abdomen. She states that she has had associated nausea, vomiting, and diarrhea since Monday. She states she has been unable to tolerate any oral intake. She reports associated fevers, chills, dysuria, and flank pain. She states that she's been unable to urinate today. Additionally, she states that she has [name], but is still are waiting to see a liver specialist. Her pain is moderate to severe. She has not tried taking anything to alleviate her symptoms.  The history is provided by the patient. No language interpreter was used.    Past Medical History  Diagnosis Date  . Hypertension   . Nonalcoholic steatohepatitis (NASH)   . Immune deficiency disorder    Past Surgical History  Procedure Laterality Date  . Tonsillectomy     No family history on file. History  Substance Use Topics  . Smoking status: Current Every Day Smoker -- 0.50 packs/day    Types: Cigarettes  . Smokeless tobacco: Not on file  . Alcohol Use: Yes     Comment: occassionally   OB History   Grav Para Term Preterm Abortions TAB SAB Ect Mult Living                 Review of Systems  All other systems reviewed and are negative.    Allergies  Iodine and Morphine and related  Home Medications   Current Outpatient Rx  Name  Route  Sig  Dispense  Refill  . acetaminophen (TYLENOL) 325 MG tablet   Oral   Take 325 mg by mouth every 6 (six) hours as needed. For liver pain         . cholestyramine (QUESTRAN) 4 G packet   Oral   Take 1 packet by mouth daily.          Marland Kitchen  dicyclomine (BENTYL) 10 MG capsule   Oral   Take 10 mg by mouth daily.         Marland Kitchen esomeprazole (NEXIUM) 10 MG packet   Oral   Take 10 mg by mouth daily before breakfast.         . ibuprofen (ADVIL,MOTRIN) 200 MG tablet   Oral   Take 200 mg by mouth every 6 (six) hours as needed for pain.         Marland Kitchen ondansetron (ZOFRAN) 4 MG tablet   Oral   Take 1 tablet (4 mg total) by mouth every 8 (eight) hours as needed for nausea.   20 tablet   0    BP 147/94  Pulse 116  Temp(Src) 98.4 F (36.9 C) (Oral)  Resp 18  Ht 5\' 1"  (1.549 m)  Wt 119 lb (53.978 kg)  BMI 22.50 kg/m2  SpO2 98%  LMP 10/12/2013 Physical Exam  Nursing note and vitals reviewed. Constitutional: She is oriented to person, place, and time. She appears well-developed and well-nourished.  HENT:  Head: Normocephalic and atraumatic.  Eyes: Conjunctivae and EOM are normal. Pupils are equal, round, and reactive to light.  Neck: Normal range of motion. Neck supple.  Cardiovascular: Normal rate and regular rhythm.  Exam reveals no gallop and no friction  rub.   No murmur heard. Pulmonary/Chest: Effort normal and breath sounds normal. No respiratory distress. She has no wheezes. She has no rales. She exhibits no tenderness.  Abdominal: Soft. She exhibits no distension and no mass. There is no tenderness. There is no rebound and no guarding.  Right lower quadrant is palpation, no other focal abdominal tenderness, no fluid wave, or signs of peritonitis  Musculoskeletal: Normal range of motion. She exhibits no edema and no tenderness.  Neurological: She is alert and oriented to person, place, and time.  Skin: Skin is warm and dry.  Psychiatric: She has a normal mood and affect. Her behavior is normal. Judgment and thought content normal.    ED Course  Procedures (including critical care time) Results for orders placed during the hospital encounter of 11/10/13  CBC WITH DIFFERENTIAL      Result Value Range   WBC 10.3  4.0  - 10.5 K/uL   RBC 3.18 (*) 3.87 - 5.11 MIL/uL   Hemoglobin 12.1  12.0 - 15.0 g/dL   HCT 33.2 (*) 36.0 - 46.0 %   MCV 104.4 (*) 78.0 - 100.0 fL   MCH 38.1 (*) 26.0 - 34.0 pg   MCHC 36.4 (*) 30.0 - 36.0 g/dL   RDW 16.9 (*) 11.5 - 15.5 %   Platelets 139 (*) 150 - 400 K/uL   Neutrophils Relative % 76  43 - 77 %   Neutro Abs 7.9 (*) 1.7 - 7.7 K/uL   Lymphocytes Relative 19  12 - 46 %   Lymphs Abs 2.0  0.7 - 4.0 K/uL   Monocytes Relative 4  3 - 12 %   Monocytes Absolute 0.4  0.1 - 1.0 K/uL   Eosinophils Relative 1  0 - 5 %   Eosinophils Absolute 0.1  0.0 - 0.7 K/uL   Basophils Relative 0  0 - 1 %   Basophils Absolute 0.0  0.0 - 0.1 K/uL  COMPREHENSIVE METABOLIC PANEL      Result Value Range   Sodium 134 (*) 137 - 147 mEq/L   Potassium 3.2 (*) 3.7 - 5.3 mEq/L   Chloride 90 (*) 96 - 112 mEq/L   CO2 26  19 - 32 mEq/L   Glucose, Bld 101 (*) 70 - 99 mg/dL   BUN 4 (*) 6 - 23 mg/dL   Creatinine, Ser 0.66  0.50 - 1.10 mg/dL   Calcium 9.3  8.4 - 10.5 mg/dL   Total Protein 7.4  6.0 - 8.3 g/dL   Albumin 4.0  3.5 - 5.2 g/dL   AST 98 (*) 0 - 37 U/L   ALT 60 (*) 0 - 35 U/L   Alkaline Phosphatase 131 (*) 39 - 117 U/L   Total Bilirubin 1.2  0.3 - 1.2 mg/dL   GFR calc non Af Amer >90  >90 mL/min   GFR calc Af Amer >90  >90 mL/min  LIPASE, BLOOD      Result Value Range   Lipase 29  11 - 59 U/L  URINALYSIS, ROUTINE W REFLEX MICROSCOPIC      Result Value Range   Color, Urine YELLOW  YELLOW   APPearance CLOUDY (*) CLEAR   Specific Gravity, Urine 1.003 (*) 1.005 - 1.030   pH 7.0  5.0 - 8.0   Glucose, UA NEGATIVE  NEGATIVE mg/dL   Hgb urine dipstick TRACE (*) NEGATIVE   Bilirubin Urine NEGATIVE  NEGATIVE   Ketones, ur NEGATIVE  NEGATIVE mg/dL   Protein, ur NEGATIVE  NEGATIVE  mg/dL   Urobilinogen, UA 0.2  0.0 - 1.0 mg/dL   Nitrite NEGATIVE  NEGATIVE   Leukocytes, UA LARGE (*) NEGATIVE  URINE MICROSCOPIC-ADD ON      Result Value Range   Squamous Epithelial / LPF RARE  RARE   WBC, UA 11-20   <3 WBC/hpf   RBC / HPF 0-2  <3 RBC/hpf   Bacteria, UA FEW (*) RARE  POCT PREGNANCY, URINE      Result Value Range   Preg Test, Ur NEGATIVE  NEGATIVE   Ct Abdomen Pelvis W Contrast  11/11/2013   CLINICAL DATA:  Bilateral flank and right upper quadrant abdominal pain.  EXAM: CT ABDOMEN AND PELVIS WITH CONTRAST  TECHNIQUE: Multidetector CT imaging of the abdomen and pelvis was performed using the standard protocol following bolus administration of intravenous contrast.  CONTRAST:  27mL OMNIPAQUE IOHEXOL 300 MG/ML  SOLN  COMPARISON:  CT of the abdomen and pelvis performed 07/25/2013  FINDINGS: The visualized lung bases are clear.  Diffuse fatty infiltration is again seen within the liver. The liver is mildly enlarged for the patient's size, measuring approximately 20.1 cm. The spleen is within normal limits. The gallbladder is slightly distended but remains within normal limits. The common hepatic duct is borderline normal in caliber. The pancreas and adrenal glands are unremarkable.  Prominent left-sided renal scarring is noted, with mild left renal atrophy. The right kidney is unremarkable in appearance. There is no evidence of hydronephrosis. No renal or ureteral stones are seen. No perinephric stranding is appreciated.  No free fluid is identified. The small bowel is unremarkable in appearance. The stomach is within normal limits. No acute vascular abnormalities are seen.  The appendix remains normal in caliber, without evidence for appendicitis. Apparent fatty infiltration within the wall of much of the colon may reflect sequelae of chronic inflammation. No definite acute soft tissue inflammation is seen.  The bladder is mildly distended and grossly unremarkable in appearance. The uterus is within normal limits. The ovaries are relatively symmetric, aside from a likely physiologic 2.2 cm left adnexal cyst. No suspicious adnexal masses are seen. No inguinal lymphadenopathy is seen.  No acute osseous  abnormalities are identified.  IMPRESSION: 1. No acute abnormality seen to explain the patient's symptoms. 2. Mild hepatomegaly.  Diffuse fatty infiltration within the liver. 3. Apparent fatty infiltration within the wall of much of the colon is better characterized than on the prior study, and may reflect sequelae of chronic inflammation.   Electronically Signed   By: Garald Balding M.D.   On: 11/11/2013 00:38      EKG Interpretation   None       MDM   1. UTI (lower urinary tract infection)   2. Abdominal pain   3. Nausea and vomiting     Patient with abdominal pain, nausea, and vomiting since Monday. Check labs, a bladder scan, CT scan, give pain medicine, and will reevaluate.  12:09 AM Patient's pain is much improved.  She has been able to urinate without difficulty.   Plan: Follow up on CT.  Patient discussed with Dr. Darl Householder, who agrees with the plan.  CT is negative for acute process. Will treat UTI with Keflex. Patient feels much better after fluids, and treatment in the emergency department. Return precautions are given. Patient is stable and ready for discharge.    Montine Circle, PA-C 11/11/13 0106

## 2013-11-10 NOTE — ED Notes (Signed)
Patient up to bathroom to provide urine specimen 

## 2013-11-11 ENCOUNTER — Encounter (HOSPITAL_COMMUNITY): Payer: Self-pay

## 2013-11-11 LAB — URINALYSIS, ROUTINE W REFLEX MICROSCOPIC
BILIRUBIN URINE: NEGATIVE
Glucose, UA: NEGATIVE mg/dL
KETONES UR: NEGATIVE mg/dL
Nitrite: NEGATIVE
PH: 7 (ref 5.0–8.0)
Protein, ur: NEGATIVE mg/dL
SPECIFIC GRAVITY, URINE: 1.003 — AB (ref 1.005–1.030)
Urobilinogen, UA: 0.2 mg/dL (ref 0.0–1.0)

## 2013-11-11 LAB — URINE MICROSCOPIC-ADD ON

## 2013-11-11 MED ORDER — HYDROCODONE-ACETAMINOPHEN 5-325 MG PO TABS: 2.0000 | ORAL_TABLET | Freq: Four times a day (QID) | ORAL | Status: DC | PRN

## 2013-11-11 MED ORDER — HYDROCODONE-ACETAMINOPHEN 5-325 MG PO TABS
2.0000 | ORAL_TABLET | Freq: Once | ORAL | Status: AC
  Administered 2013-11-11: 2 via ORAL
  Filled 2013-11-11: qty 2

## 2013-11-11 MED ORDER — CEPHALEXIN 500 MG PO CAPS: 500.0000 mg | ORAL_CAPSULE | Freq: Four times a day (QID) | ORAL | Status: DC

## 2013-11-11 MED ORDER — IOHEXOL 300 MG/ML  SOLN
80.0000 mL | Freq: Once | INTRAMUSCULAR | Status: AC | PRN
  Administered 2013-11-11: 80 mL via INTRAVENOUS

## 2013-11-11 MED ORDER — ONDANSETRON HCL 4 MG PO TABS: 4.0000 mg | ORAL_TABLET | Freq: Four times a day (QID) | ORAL | Status: DC

## 2013-11-11 NOTE — ED Provider Notes (Signed)
Medical screening examination/treatment/procedure(s) were performed by non-physician practitioner and as supervising physician I was immediately available for consultation/collaboration.  EKG Interpretation   None         Wandra Arthurs, MD 11/11/13 [phone]

## 2013-11-11 NOTE — Discharge Instructions (Signed)
Abdominal Pain, Adult °Many things can cause abdominal pain. Usually, abdominal pain is not caused by a disease and will improve without treatment. It can often be observed and treated at home. Your health care provider will do a physical exam and possibly order blood tests and X-rays to help determine the seriousness of your pain. However, in many cases, more time must pass before a clear cause of the pain can be found. Before that point, your health care provider may not know if you need more testing or further treatment. °HOME CARE INSTRUCTIONS  °Monitor your abdominal pain for any changes. The following actions may help to alleviate any discomfort you are experiencing: °· Only take over-the-counter or prescription medicines as directed by your health care provider. °· Do not take laxatives unless directed to do so by your health care provider. °· Try a clear liquid diet (broth, tea, or water) as directed by your health care provider. Slowly move to a bland diet as tolerated. °SEEK MEDICAL CARE IF: °· You have unexplained abdominal pain. °· You have abdominal pain associated with nausea or diarrhea. °· You have pain when you urinate or have a bowel movement. °· You experience abdominal pain that wakes you in the night. °· You have abdominal pain that is worsened or improved by eating food. °· You have abdominal pain that is worsened with eating fatty foods. °SEEK IMMEDIATE MEDICAL CARE IF:  °· Your pain does not go away within 2 hours. °· You have a fever. °· You keep throwing up (vomiting). °· Your pain is felt only in portions of the abdomen, such as the right side or the left lower portion of the abdomen. °· You pass bloody or black tarry stools. °MAKE SURE YOU: °· Understand these instructions.   °· Will watch your condition.   °· Will get help right away if you are not doing well or get worse.   °Document Released: 07/16/2005 Document Revised: 07/27/2013 Document Reviewed: 06/15/2013 °[employer]® Patient  Information ©2014 [employer]. ° °

## 2013-11-11 NOTE — ED Notes (Signed)
Patient transported to CT 

## 2013-11-12 LAB — URINE CULTURE

## 2013-11-13 ENCOUNTER — Telehealth (HOSPITAL_COMMUNITY): Payer: Self-pay | Admitting: Emergency Medicine

## 2013-11-13 NOTE — ED Notes (Signed)
Post ED Visit - Positive Culture Follow-up  Culture report reviewed by antimicrobial stewardship pharmacist: []  Wes Dulaney, Pharm.D., BCPS [x]  Heide Guile, Pharm.D., BCPS []  Alycia Rossetti, Pharm.D., BCPS []  [place], [place].D., BCPS, AAHIVP []  Legrand Como, Pharm.D., BCPS, AAHIVP  Positive urine culture Treated with Keflex, organism sensitive to the same and no further patient follow-up is required at this time.  Matthew Saras, Rex Kras 11/13/2013, 11:45 AM

## 2013-11-17 ENCOUNTER — Ambulatory Visit: Payer: No Typology Code available for payment source | Attending: Internal Medicine | Admitting: Internal Medicine

## 2013-11-17 ENCOUNTER — Encounter: Payer: Self-pay | Admitting: Internal Medicine

## 2013-11-17 ENCOUNTER — Ambulatory Visit: Payer: No Typology Code available for payment source | Attending: Internal Medicine

## 2013-11-17 VITALS — BP 132/86 | HR 101 | Temp 97.7°F | Resp 16 | Ht 61.0 in | Wt 117.0 lb

## 2013-11-17 DIAGNOSIS — K7581 Nonalcoholic steatohepatitis (NASH): Secondary | ICD-10-CM

## 2013-11-17 DIAGNOSIS — K7689 Other specified diseases of liver: Secondary | ICD-10-CM

## 2013-11-17 DIAGNOSIS — R109 Unspecified abdominal pain: Secondary | ICD-10-CM | POA: Insufficient documentation

## 2013-11-17 LAB — CBC WITH DIFFERENTIAL/PLATELET
Basophils Absolute: 0 10*3/uL (ref 0.0–0.1)
Basophils Relative: 1 % (ref 0–1)
EOS ABS: 0.1 10*3/uL (ref 0.0–0.7)
EOS PCT: 2 % (ref 0–5)
HCT: 35.5 % — ABNORMAL LOW (ref 36.0–46.0)
Hemoglobin: 13 g/dL (ref 12.0–15.0)
LYMPHS ABS: 1.9 10*3/uL (ref 0.7–4.0)
LYMPHS PCT: 29 % (ref 12–46)
MCH: 38.3 pg — AB (ref 26.0–34.0)
MCHC: 36.6 g/dL — ABNORMAL HIGH (ref 30.0–36.0)
MCV: 104.7 fL — AB (ref 78.0–100.0)
MONO ABS: 0.7 10*3/uL (ref 0.1–1.0)
Monocytes Relative: 11 % (ref 3–12)
Neutro Abs: 3.9 10*3/uL (ref 1.7–7.7)
Neutrophils Relative %: 57 % (ref 43–77)
PLATELETS: 370 10*3/uL (ref 150–400)
RBC: 3.39 MIL/uL — AB (ref 3.87–5.11)
RDW: 18.6 % — AB (ref 11.5–15.5)
WBC: 6.6 10*3/uL (ref 4.0–10.5)

## 2013-11-17 LAB — LIPID PANEL
Cholesterol: 179 mg/dL (ref 0–200)
HDL: 66 mg/dL (ref >39)
LDL Cholesterol: 38 mg/dL (ref 0–99)
Total CHOL/HDL Ratio: 2.7 Ratio
Triglycerides: 377 mg/dL — ABNORMAL HIGH (ref <150)
VLDL: 75 mg/dL — AB (ref 0–40)

## 2013-11-17 LAB — COMPLETE METABOLIC PANEL WITH GFR
ALBUMIN: 4 g/dL (ref 3.5–5.2)
ALK PHOS: 129 U/L — AB (ref 39–117)
ALT: 104 U/L — AB (ref 0–35)
AST: 196 U/L — AB (ref 0–37)
BILIRUBIN TOTAL: 0.5 mg/dL (ref 0.2–1.2)
BUN: 3 mg/dL — ABNORMAL LOW (ref 6–23)
CO2: 25 mEq/L (ref 19–32)
Calcium: 9.3 mg/dL (ref 8.4–10.5)
Chloride: 94 mEq/L — ABNORMAL LOW (ref 96–112)
Creat: 0.69 mg/dL (ref 0.50–1.10)
GFR, Est African American: >89 mL/min
GLUCOSE: 102 mg/dL — AB (ref 70–99)
POTASSIUM: 2.8 meq/L — AB (ref 3.5–5.3)
SODIUM: 133 meq/L — AB (ref 135–145)
TOTAL PROTEIN: 6.6 g/dL (ref 6.0–8.3)

## 2013-11-17 MED ORDER — TRAMADOL HCL 50 MG PO TABS: 50.0000 mg | ORAL_TABLET | Freq: Three times a day (TID) | ORAL | Status: DC | PRN

## 2013-11-17 MED ORDER — ESOMEPRAZOLE MAGNESIUM 10 MG PO PACK: 10.0000 mg | PACK | Freq: Every day | ORAL | Status: DC

## 2013-11-17 NOTE — Progress Notes (Signed)
HFU Pt is having severe abdomen pain. Causing her to stop eating and making her BM painful and she is having trouble urinating.

## 2013-11-17 NOTE — Progress Notes (Signed)
Patient ID: Jamie Allen, female   DOB: Jan 14, 1971, 43 y.o.   MRN: [id number]   CC:  HPI: 43 year old female here for followup for her lower abdominal pain. 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 The patient describes lower quadrant pain, describes it like giving birth to a child. The patient describes 6-7 bowel movements a day, bowel movements are mucusy, occasionally bloody, foul-smelling. The patient is afraid of eating and continues to lose weight. She states her Crohn's disease runs in her family. She had an appointment with a gastroenterologist at [facility] and October and endoscopy done, and was told that the patient has a bacterial infection. According to the scan and EGD report from 12/14, the patient's EGD was normal.   Allergies  Allergen Reactions  . Iodine Swelling  . Morphine And Related Other (See Comments)    unknown   Past Medical History  Diagnosis Date  . Hypertension   . Nonalcoholic steatohepatitis (NASH)   . Immune deficiency disorder    Current Outpatient Prescriptions on File Prior to Visit  Medication Sig Dispense Refill  . cephALEXin (KEFLEX) 500 MG capsule Take 1 capsule (500 mg total) by mouth 4 (four) times daily.  40 capsule  0  . cholestyramine (QUESTRAN) 4 G packet Take 1 packet by mouth daily.       Marland Kitchen dicyclomine (BENTYL) 10 MG capsule Take 10 mg by mouth daily.      Marland Kitchen esomeprazole (NEXIUM) 10 MG packet Take 10 mg by mouth daily before breakfast.      . ondansetron (ZOFRAN) 4 MG tablet Take 1 tablet (4 mg total) by mouth every 8 (eight) hours as needed for nausea.  20 tablet  0  . ondansetron (ZOFRAN) 4 MG tablet Take 1 tablet (4 mg total) by mouth every 6 (six) hours.  12 tablet  0   No current facility-administered medications on file prior to visit.   History reviewed. No pertinent family history. History   Social History  . Marital Status: Divorced    Spouse  Name: N/A    Number of Children: N/A  . Years of Education: N/A   Occupational History  . Not on file.   Social History Main Topics  . Smoking status: Current Every Day Smoker -- 0.50 packs/day    Types: Cigarettes  . Smokeless tobacco: Not on file  . Alcohol Use: Yes     Comment: occassionally  . Drug Use: No  . Sexual Activity: Yes    Birth Control/ Protection: None   Other Topics Concern  . Not on file   Social History Narrative  . No narrative on file    Review of Systems  Constitutional: Negative for fever, chills, diaphoresis, activity change, appetite change and fatigue.  HENT: Negative for ear pain, nosebleeds, congestion, facial swelling, rhinorrhea, neck pain, neck stiffness and ear discharge.   Eyes: Negative for pain, discharge, redness, itching and visual disturbance.  Respiratory: Negative for cough, choking, chest tightness, shortness of breath, wheezing and stridor.   Cardiovascular: Negative for chest pain, palpitations and leg swelling.  Gastrointestinal: As in history of present illness  Genitourinary: Negative for dysuria, urgency, frequency, hematuria, flank pain, decreased urine volume, difficulty urinating and dyspareunia.  Musculoskeletal: Negative for back pain, joint swelling, arthralgias and gait problem.  Neurological: Negative for dizziness, tremors, seizures, syncope, facial asymmetry, speech difficulty, weakness, light-headedness, numbness and headaches.  Hematological: Negative for adenopathy. Does not bruise/bleed easily.  Psychiatric/Behavioral:  Negative for hallucinations, behavioral problems, confusion, dysphoric mood, decreased concentration and agitation.    Objective:   Filed Vitals:   11/17/13 1148  BP: 132/86  Pulse: 101  Temp: 97.7 F (36.5 C)  Resp: 16    Physical Exam  Constitutional: Appears well-developed and well-nourished. No distress.  HENT: Normocephalic. External right and left ear normal. Oropharynx is clear and  moist.  Eyes: Conjunctivae and EOM are normal. PERRLA, no scleral icterus.  Neck: Normal ROM. Neck supple. No JVD. No tracheal deviation. No thyromegaly.  CVS: RRR, S1/S2 +, no murmurs, no gallops, no carotid bruit.  Pulmonary: Effort and breath sounds normal, no stridor, rhonchi, wheezes, rales.  Abdominal: Soft. BS +,  no distension, tenderness present in the left and the right lower quadrant, rebound or guarding.  Musculoskeletal: Normal range of motion. No edema and no tenderness.  Lymphadenopathy: No lymphadenopathy noted, cervical, inguinal. Neuro: Alert. Normal reflexes, muscle tone coordination. No cranial nerve deficit. Skin: Skin is warm and dry. No rash noted. Not diaphoretic. No erythema. No pallor.  Psychiatric: Normal mood and affect. Behavior, judgment, thought content normal.   Lab Results  Component Value Date   WBC 10.3 11/10/2013   HGB 12.1 11/10/2013   HCT 33.2* 11/10/2013   MCV 104.4* 11/10/2013   PLT 139* 11/10/2013   Lab Results  Component Value Date   CREATININE 0.66 11/10/2013   BUN 4* 11/10/2013   NA 134* 11/10/2013   K 3.2* 11/10/2013   CL 90* 11/10/2013   CO2 26 11/10/2013    No results found for this basename: HGBA1C   Lipid Panel  No results found for this basename: chol, trig, hdl, cholhdl, vldl, ldlcalc       Assessment and plan:   Patient Active Problem List   Diagnosis Date Noted  . 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 Diarrhea 07/21/2013  . Nausea with vomiting 07/21/2013  . Abdominal  pain, other specified site 07/07/2013       Abdominal pain CT scan on 11/10/13 shows infiltration of the wall of the colon, patient probably needs a colonoscopy with biopsy for diagnosis of ulcerative colitis/Crohn's disease She also needs a GI referral for evaluation of her liver cirrhosis as well as positive anti-smooth muscle antibody I have asked her to refrain from taking Vicodin, Tylenol, ibuprofen She will be provided with tramadol  for her abdominal pain Repeat CMP, CBC, lipid panel today  Follow her in 2 months   The patient was given clear instructions to go to ER or return to medical center if symptoms don't improve, worsen or new problems develop. The patient verbalized understanding. The patient was told to call to get any lab results if not heard anything in the next week.

## 2013-11-18 ENCOUNTER — Telehealth: Payer: Self-pay | Admitting: *Deleted

## 2013-11-18 MED ORDER — POTASSIUM CHLORIDE CRYS ER 20 MEQ PO TBCR: 40.0000 meq | EXTENDED_RELEASE_TABLET | Freq: Two times a day (BID) | ORAL | Status: DC

## 2013-11-18 NOTE — Telephone Encounter (Signed)
Message copied by Benjimen Kelley, [place] R on Fri Nov 18, 2013  2:18 PM ------      Message from: Allyson Sabal MD, [facility]      Created: Fri Nov 18, 2013 11:19 AM       Notify patient and the patient's potassium is low at 2.8. Please call in a prescription for k-dur 40 meq twice a day, 60 tablets with 2 refills      Repeat CMP in 2 weeks from now      Patient must keep her appointment with gastroenterology because of abnormal liver function ------

## 2013-11-18 NOTE — Telephone Encounter (Signed)
Scheduled a lab visit 12/02/13 at 2:00pm. Spoke with fiance about lab results.

## 2013-12-02 ENCOUNTER — Other Ambulatory Visit: Payer: Self-pay

## 2013-12-02 ENCOUNTER — Ambulatory Visit: Payer: No Typology Code available for payment source | Attending: Internal Medicine

## 2013-12-02 ENCOUNTER — Telehealth: Payer: Self-pay | Admitting: Emergency Medicine

## 2013-12-02 DIAGNOSIS — R7989 Other specified abnormal findings of blood chemistry: Secondary | ICD-10-CM

## 2013-12-02 LAB — COMPREHENSIVE METABOLIC PANEL
ALT: 97 U/L — AB (ref 0–35)
AST: 180 U/L — ABNORMAL HIGH (ref 0–37)
Albumin: 3.8 g/dL (ref 3.5–5.2)
Alkaline Phosphatase: 169 U/L — ABNORMAL HIGH (ref 39–117)
BILIRUBIN TOTAL: 0.7 mg/dL (ref 0.2–1.2)
BUN: 4 mg/dL — AB (ref 6–23)
CALCIUM: 9.5 mg/dL (ref 8.4–10.5)
CHLORIDE: 99 meq/L (ref 96–112)
CO2: 25 meq/L (ref 19–32)
Creat: 0.76 mg/dL (ref 0.50–1.10)
Glucose, Bld: 115 mg/dL — ABNORMAL HIGH (ref 70–99)
Potassium: 4.6 mEq/L (ref 3.5–5.3)
Sodium: 135 mEq/L (ref 135–145)
Total Protein: 6.6 g/dL (ref 6.0–8.3)

## 2013-12-02 NOTE — Telephone Encounter (Signed)
Spoke with [facility]. Pt requesting refill Tramadol before next scheduled refill. Pt received last refill on 1/30 for #60. Pt will need to with until Monday for next pick up.

## 2013-12-05 ENCOUNTER — Encounter (HOSPITAL_COMMUNITY): Payer: Self-pay | Admitting: Emergency Medicine

## 2013-12-05 ENCOUNTER — Emergency Department (HOSPITAL_COMMUNITY)
Admission: EM | Admit: 2013-12-05 | Discharge: 2013-12-05 | Discharge status: Against Medical Advice | Payer: No Typology Code available for payment source | Attending: Emergency Medicine | Admitting: Emergency Medicine

## 2013-12-05 DIAGNOSIS — F17200 Nicotine dependence, unspecified, uncomplicated: Secondary | ICD-10-CM | POA: Insufficient documentation

## 2013-12-05 DIAGNOSIS — R1031 Right lower quadrant pain: Secondary | ICD-10-CM | POA: Insufficient documentation

## 2013-12-05 DIAGNOSIS — I10 Essential (primary) hypertension: Secondary | ICD-10-CM | POA: Insufficient documentation

## 2013-12-05 NOTE — ED Notes (Signed)
Per pt, RLQ pain for over a year, has been having rectal bleeding-unable to keep food/meds down

## 2013-12-05 NOTE — ED Notes (Signed)
Pt came up to desk asking how long it would be. Pt told that it should be 15-20 mins before she is triaged. Pt upset.

## 2013-12-05 NOTE — ED Notes (Signed)
Per EMS, has had RLQ pain for months-increasingly getting worse over past 4 days-blood in stool and vomit-states has tramadol with a beer before she called EMS

## 2013-12-09 ENCOUNTER — Telehealth: Payer: Self-pay | Admitting: *Deleted

## 2013-12-09 NOTE — Telephone Encounter (Signed)
Left a voicemail for patient to give us a call back.

## 2013-12-09 NOTE — Telephone Encounter (Signed)
Patient returned a call. Notified patient that potassium is 4.6. She should back her potassium to once a day orally. Her liver function is still normal because of cirrhosis. She must follow up with gastroenterology referral.

## 2013-12-09 NOTE — Telephone Encounter (Signed)
Message copied by Antonio Creswell, [place] R on Fri Dec 09, 2013 11:03 AM ------      Message from: Allyson Sabal MD, [facility]      Created: Mon Dec 05, 2013 11:57 AM       Notify patient that potassium is 4.6. She should back her potassium to once a day orally. Her liver function is still normal because of cirrhosis. She must follow up with gastroenterology referral       ------

## 2013-12-12 ENCOUNTER — Emergency Department (HOSPITAL_BASED_OUTPATIENT_CLINIC_OR_DEPARTMENT_OTHER)
Admission: EM | Admit: 2013-12-12 | Discharge: 2013-12-12 | Disposition: A | Discharge status: Home / Self Care | Payer: No Typology Code available for payment source | Attending: Emergency Medicine | Admitting: Emergency Medicine

## 2013-12-12 ENCOUNTER — Encounter (HOSPITAL_BASED_OUTPATIENT_CLINIC_OR_DEPARTMENT_OTHER): Payer: Self-pay | Admitting: Emergency Medicine

## 2013-12-12 ENCOUNTER — Emergency Department (HOSPITAL_BASED_OUTPATIENT_CLINIC_OR_DEPARTMENT_OTHER): Payer: No Typology Code available for payment source

## 2013-12-12 DIAGNOSIS — Z79899 Other long term (current) drug therapy: Secondary | ICD-10-CM | POA: Insufficient documentation

## 2013-12-12 DIAGNOSIS — N390 Urinary tract infection, site not specified: Secondary | ICD-10-CM | POA: Insufficient documentation

## 2013-12-12 DIAGNOSIS — R7989 Other specified abnormal findings of blood chemistry: Secondary | ICD-10-CM

## 2013-12-12 DIAGNOSIS — F17200 Nicotine dependence, unspecified, uncomplicated: Secondary | ICD-10-CM | POA: Insufficient documentation

## 2013-12-12 DIAGNOSIS — Z8719 Personal history of other diseases of the digestive system: Secondary | ICD-10-CM | POA: Insufficient documentation

## 2013-12-12 DIAGNOSIS — I10 Essential (primary) hypertension: Secondary | ICD-10-CM | POA: Insufficient documentation

## 2013-12-12 DIAGNOSIS — R112 Nausea with vomiting, unspecified: Secondary | ICD-10-CM | POA: Insufficient documentation

## 2013-12-12 DIAGNOSIS — Z8639 Personal history of other endocrine, nutritional and metabolic disease: Secondary | ICD-10-CM | POA: Insufficient documentation

## 2013-12-12 DIAGNOSIS — R945 Abnormal results of liver function studies: Secondary | ICD-10-CM | POA: Insufficient documentation

## 2013-12-12 DIAGNOSIS — R109 Unspecified abdominal pain: Secondary | ICD-10-CM | POA: Insufficient documentation

## 2013-12-12 DIAGNOSIS — Z862 Personal history of diseases of the blood and blood-forming organs and certain disorders involving the immune mechanism: Secondary | ICD-10-CM | POA: Insufficient documentation

## 2013-12-12 LAB — CBC WITH DIFFERENTIAL/PLATELET
BASOS ABS: 0 10*3/uL (ref 0.0–0.1)
Basophils Relative: 0 % (ref 0–1)
Eosinophils Absolute: 0.1 10*3/uL (ref 0.0–0.7)
Eosinophils Relative: 2 % (ref 0–5)
HCT: 34.6 % — ABNORMAL LOW (ref 36.0–46.0)
Hemoglobin: 12.4 g/dL (ref 12.0–15.0)
LYMPHS ABS: 1.1 10*3/uL (ref 0.7–4.0)
Lymphocytes Relative: 16 % (ref 12–46)
MCH: 39.2 pg — ABNORMAL HIGH (ref 26.0–34.0)
MCHC: 35.8 g/dL (ref 30.0–36.0)
MCV: 109.5 fL — ABNORMAL HIGH (ref 78.0–100.0)
Monocytes Absolute: 0.4 10*3/uL (ref 0.1–1.0)
Monocytes Relative: 5 % (ref 3–12)
NEUTROS ABS: 5.2 10*3/uL (ref 1.7–7.7)
Neutrophils Relative %: 77 % (ref 43–77)
Platelets: 189 10*3/uL (ref 150–400)
RBC: 3.16 MIL/uL — AB (ref 3.87–5.11)
RDW: 13.7 % (ref 11.5–15.5)
WBC: 6.8 10*3/uL (ref 4.0–10.5)

## 2013-12-12 LAB — COMPREHENSIVE METABOLIC PANEL
ALT: 133 U/L — AB (ref 0–35)
AST: 306 U/L — AB (ref 0–37)
Albumin: 3.7 g/dL (ref 3.5–5.2)
Alkaline Phosphatase: 278 U/L — ABNORMAL HIGH (ref 39–117)
BUN: 2 mg/dL — ABNORMAL LOW (ref 6–23)
CHLORIDE: 95 meq/L — AB (ref 96–112)
CO2: 23 meq/L (ref 19–32)
Calcium: 9.6 mg/dL (ref 8.4–10.5)
Creatinine, Ser: 0.6 mg/dL (ref 0.50–1.10)
GLUCOSE: 150 mg/dL — AB (ref 70–99)
Potassium: 4 mEq/L (ref 3.7–5.3)
SODIUM: 139 meq/L (ref 137–147)
Total Bilirubin: 1.8 mg/dL — ABNORMAL HIGH (ref 0.3–1.2)
Total Protein: 7.1 g/dL (ref 6.0–8.3)

## 2013-12-12 LAB — URINALYSIS, ROUTINE W REFLEX MICROSCOPIC
GLUCOSE, UA: NEGATIVE mg/dL
HGB URINE DIPSTICK: NEGATIVE
KETONES UR: 15 mg/dL — AB
Nitrite: POSITIVE — AB
PROTEIN: 30 mg/dL — AB
Specific Gravity, Urine: 1.021 (ref 1.005–1.030)
Urobilinogen, UA: 2 mg/dL — ABNORMAL HIGH (ref 0.0–1.0)
pH: 8.5 — ABNORMAL HIGH (ref 5.0–8.0)

## 2013-12-12 LAB — URINE MICROSCOPIC-ADD ON

## 2013-12-12 LAB — LIPASE, BLOOD: Lipase: 43 U/L (ref 11–59)

## 2013-12-12 LAB — PREGNANCY, URINE: Preg Test, Ur: NEGATIVE

## 2013-12-12 MED ORDER — SENNOSIDES-DOCUSATE SODIUM 8.6-50 MG PO TABS: 2.0000 | ORAL_TABLET | Freq: Every day | ORAL | Status: DC

## 2013-12-12 MED ORDER — LORAZEPAM 2 MG/ML IJ SOLN
0.5000 mg | Freq: Once | INTRAMUSCULAR | Status: AC
  Administered 2013-12-12: 0.5 mg via INTRAVENOUS
  Filled 2013-12-12: qty 1

## 2013-12-12 MED ORDER — DEXTROSE 5 % IV SOLN
1.0000 g | Freq: Once | INTRAVENOUS | Status: AC
  Administered 2013-12-12: 1 g via INTRAVENOUS

## 2013-12-12 MED ORDER — ONDANSETRON HCL 4 MG/2ML IJ SOLN
4.0000 mg | Freq: Once | INTRAMUSCULAR | Status: AC
  Administered 2013-12-12: 4 mg via INTRAVENOUS
  Filled 2013-12-12: qty 2

## 2013-12-12 MED ORDER — SODIUM CHLORIDE 0.9 % IV SOLN
1000.0000 mL | INTRAVENOUS | Status: DC
  Administered 2013-12-12: 1000 mL via INTRAVENOUS

## 2013-12-12 MED ORDER — OXYCODONE HCL 5 MG PO CAPS: 5.0000 mg | ORAL_CAPSULE | Freq: Four times a day (QID) | ORAL | Status: DC | PRN

## 2013-12-12 MED ORDER — ONDANSETRON 4 MG PO TBDP: 4.0000 mg | ORAL_TABLET | Freq: Three times a day (TID) | ORAL | Status: DC | PRN

## 2013-12-12 MED ORDER — CEPHALEXIN 500 MG PO CAPS: 500.0000 mg | ORAL_CAPSULE | Freq: Two times a day (BID) | ORAL | Status: DC

## 2013-12-12 MED ORDER — PROMETHAZINE HCL 25 MG RE SUPP: 25.0000 mg | Freq: Four times a day (QID) | RECTAL | Status: DC | PRN

## 2013-12-12 MED ORDER — SODIUM CHLORIDE 0.9 % IV BOLUS (SEPSIS)
1000.0000 mL | Freq: Once | INTRAVENOUS | Status: AC
  Administered 2013-12-12: 1000 mL via INTRAVENOUS

## 2013-12-12 MED ORDER — CEFTRIAXONE SODIUM 1 G IJ SOLR
INTRAMUSCULAR | Status: AC
  Filled 2013-12-12: qty 10

## 2013-12-12 MED ORDER — FENTANYL CITRATE 0.05 MG/ML IJ SOLN
50.0000 ug | Freq: Once | INTRAMUSCULAR | Status: AC
  Administered 2013-12-12: 50 ug via INTRAVENOUS
  Filled 2013-12-12: qty 2

## 2013-12-12 NOTE — ED Notes (Signed)
Pt c/o abdominal pain and vomiting for a while. Pt scheduled for colonoscopy for March.

## 2013-12-12 NOTE — ED Provider Notes (Signed)
CSN: [id number]     Arrival date & time 12/12/13  1515 History  This chart was scribed for Carmin Muskrat, MD by Adriana Reams, ED Scribe. This patient was seen in room MH10/MH10 and the patient's care was started at 1546.    First MD Initiated Contact with Patient 12/12/13 1546     Chief Complaint  Patient presents with  . Emesis  . Abdominal Pain      The history is provided by the patient and the spouse. No language interpreter was used.   HPI Comments: Jamie Allen is a 43 y.o. female who presents to the Emergency Department complaining of 3 days of exacerbation of her chronic emesis and abdominal pain that has been ongoing for 6 months. She states before she vomits her stomach becomes distended and she experiences right sided abdominal pain. She reports associated diaphoresis which is new, and decreased appetite, which is baseline.  She has not had a bowel movement in 3 days, but her last bowel movement was streaked with blood. She has a colonoscopy scheduled in March. She denies rash, leg swelling or any new symptoms.    Past Medical History  Diagnosis Date  . Hypertension   . Nonalcoholic steatohepatitis (NASH)   . Immune deficiency disorder    Past Surgical History  Procedure Laterality Date  . Tonsillectomy     No family history on file. History  Substance Use Topics  . Smoking status: Current Every Day Smoker -- 0.50 packs/day    Types: Cigarettes  . Smokeless tobacco: Not on file  . Alcohol Use: Yes     Comment: occassionally   OB History   Grav Para Term Preterm Abortions TAB SAB Ect Mult Living                 Review of Systems  Constitutional:       Per HPI, otherwise negative  HENT:       Per HPI, otherwise negative  Respiratory:       Per HPI, otherwise negative  Cardiovascular:       Per HPI, otherwise negative  Gastrointestinal: Negative for vomiting.  Endocrine:       Negative aside from HPI  Genitourinary:       Neg aside from HPI    Musculoskeletal:       Per HPI, otherwise negative  Skin: Negative.   Neurological: Negative for syncope.      Allergies  Iodine and Morphine and related  Home Medications   Current Outpatient Rx  Name  Route  Sig  Dispense  Refill  . cholestyramine (QUESTRAN) 4 G packet   Oral   Take 1 packet by mouth daily.          Marland Kitchen dicyclomine (BENTYL) 10 MG capsule   Oral   Take 10 mg by mouth daily.         . metoCLOPramide (REGLAN) 10 MG tablet   Oral   Take 10 mg by mouth every 6 (six) hours as needed for nausea.         . potassium chloride SA (K-DUR,KLOR-CON) 20 MEQ tablet   Oral   Take 2 tablets (40 mEq total) by mouth 2 (two) times daily.   60 tablet   2   . traMADol (ULTRAM) 50 MG tablet   Oral   Take 1 tablet (50 mg total) by mouth every 8 (eight) hours as needed.   60 tablet   1    Triage Vitals; BP  146/87  Pulse 134  Temp(Src) 98.1 F (36.7 C) (Oral)  Resp 22  SpO2 99%  Physical Exam  Nursing note and vitals reviewed. Constitutional: She is oriented to person, place, and time. She appears well-developed and well-nourished. No distress.  HENT:  Head: Normocephalic and atraumatic.  Eyes: Conjunctivae and EOM are normal.  Cardiovascular: Normal rate and regular rhythm.   Pulmonary/Chest: Effort normal and breath sounds normal. No stridor. No respiratory distress.  Abdominal: She exhibits no distension. There is tenderness.  Right sided tenderness to palpation   Musculoskeletal: She exhibits no edema.  Neurological: She is alert and oriented to person, place, and time. No cranial nerve deficit.  Skin: Skin is warm and dry.  Psychiatric: She has a normal mood and affect.    ED Course  Procedures (including critical care time) COORDINATION OF CARE: 4:16 PM Discussed treatment plan which includes IV fluids, 4 mg Zofran injection, 50 mcg Sublimaze injection, .63m Ativan injection, abdominal ultrasound, GI pathogen panel by PCR, pregnancy test, CBC  with differential, comprehensive metabolic panel, blood lipase, and urinalysis with pt at bedside and pt agreed to plan.   5:18 PM Rechecked pt. Discussed initial results.   Labs Review Labs Reviewed  CBC WITH DIFFERENTIAL - Abnormal; Notable for the following:    RBC 3.16 (*)    HCT 34.6 (*)    MCV 109.5 (*)    MCH 39.2 (*)    All other components within normal limits  COMPREHENSIVE METABOLIC PANEL - Abnormal; Notable for the following:    Chloride 95 (*)    Glucose, Bld 150 (*)    BUN 2 (*)    AST 306 (*)    ALT 133 (*)    Alkaline Phosphatase 278 (*)    Total Bilirubin 1.8 (*)    All other components within normal limits  URINALYSIS, ROUTINE W REFLEX MICROSCOPIC - Abnormal; Notable for the following:    Color, Urine ORANGE (*)    APPearance CLOUDY (*)    pH 8.5 (*)    Bilirubin Urine MODERATE (*)    Ketones, ur 15 (*)    Protein, ur 30 (*)    Urobilinogen, UA 2.0 (*)    Nitrite POSITIVE (*)    Leukocytes, UA SMALL (*)    All other components within normal limits  URINE MICROSCOPIC-ADD ON - Abnormal; Notable for the following:    Squamous Epithelial / LPF FEW (*)    Bacteria, UA FEW (*)    All other components within normal limits  LIPASE, BLOOD  PREGNANCY, URINE  GI PATHOGEN PANEL BY PCR, STOOL   Imaging Review [place]domen Complete  12/12/2013   CLINICAL DATA:  Right upper quadrant pain  EXAM: ULTRASOUND ABDOMEN COMPLETE  COMPARISON:  CT 11/11/2013  FINDINGS: Gallbladder:  No gallstones or wall thickening visualized. No sonographic Murphy sign noted.  Common bile duct:  Diameter: Normal at 2.1 mm  Liver:  Diffusely increased in echogenicity.  No duct dilatation.  IVC:  No abnormality visualized.  Pancreas:  Visualized portion unremarkable.  Spleen:  Size and appearance within normal limits.  Right Kidney:  Length: 11.1 cm. Echogenicity within normal limits. No mass or hydronephrosis visualized.  Left Kidney:  Length: 8.5 cm . Echogenicity within normal limits. No mass or  hydronephrosis visualized. Cortical scarring in the upper pole.  Abdominal aorta:  No aneurysm visualized.  Other findings:  No free fluid.  IMPRESSION: 1. Increased liver echogenicity most consistent with hepatic steatosis. 2. Normal gallbladder.  Electronically Signed   By: Suzy Bouchard M.D.   On: 12/12/2013 18:07   I reviewed the EMR, including recent CT scan  7:19 PM On repeat exam the patient is improved in appearance, notes that she feels better.  We reviewed all results thus far.   10:14 PM Patient appears substantially better.  She has been ambulatory, has better skin color. We discussed all results - [name] and labs.  Patient reiterates that she has f/u scheduled w GI within one week. We discussed methods for symptom control pending her F/U visit. We also discussed return precautions and the importance of her upcoming GI visit.    MDM   This patient presents with months of ongoing nausea, fatigue, abdominal pain. The patient was awake and alert, though ill-appearing on initial exam.  Patient improved substantially here with fluid resuscitation, and initiation of antibiotics for a urinary tract infection.  Patient's labs also demonstrated presence of persistent abnormal liver function tests, worse since most recent evaluation. Patient's improvement, and her documented history of liver disease, as well as her already scheduled followup with gastroenterology within one week if her appropriate for discharge with outpatient followup. Patient did require initiation of antibiotics for her urinary tract infection.    Carmin Muskrat, MD 12/12/13 2218

## 2013-12-12 NOTE — ED Notes (Signed)
Called into pt's room-states EDP advised her that he was going to d/c her-requested IV removed-done-states she needs to let ride in the ED WR know that she will be d/c soon

## 2013-12-12 NOTE — Discharge Instructions (Signed)
As discussed, it is important that you follow up as soon as possible with your physician for continued management of your condition. ° °If you develop any new, or concerning changes in your condition, please return to the emergency department immediately. ° °

## 2013-12-13 ENCOUNTER — Telehealth: Payer: Self-pay | Admitting: Emergency Medicine

## 2013-12-13 NOTE — Telephone Encounter (Signed)
Message copied by Ricci Barker on Tue Dec 13, 2013  5:31 PM ------      Message from: Allyson Sabal MD, [facility]      Created: Mon Dec 05, 2013 11:57 AM       Notify patient that potassium is 4.6. She should back her potassium to once a day orally. Her liver function is still normal because of cirrhosis. She must follow up with gastroenterology referral       ------

## 2013-12-15 ENCOUNTER — Encounter (HOSPITAL_BASED_OUTPATIENT_CLINIC_OR_DEPARTMENT_OTHER): Payer: Self-pay | Admitting: Emergency Medicine

## 2013-12-15 ENCOUNTER — Emergency Department (HOSPITAL_BASED_OUTPATIENT_CLINIC_OR_DEPARTMENT_OTHER): Payer: No Typology Code available for payment source

## 2013-12-15 ENCOUNTER — Inpatient Hospital Stay (HOSPITAL_BASED_OUTPATIENT_CLINIC_OR_DEPARTMENT_OTHER)
Admission: EM | Admit: 2013-12-15 | Discharge: 2013-12-23 | DRG: 372 | Disposition: A | Discharge status: Home / Self Care | Payer: No Typology Code available for payment source | Attending: Internal Medicine | Admitting: Internal Medicine

## 2013-12-15 DIAGNOSIS — K219 Gastro-esophageal reflux disease without esophagitis: Secondary | ICD-10-CM | POA: Diagnosis present

## 2013-12-15 DIAGNOSIS — R197 Diarrhea, unspecified: Secondary | ICD-10-CM

## 2013-12-15 DIAGNOSIS — Z87440 Personal history of urinary (tract) infections: Secondary | ICD-10-CM

## 2013-12-15 DIAGNOSIS — K7581 Nonalcoholic steatohepatitis (NASH): Secondary | ICD-10-CM

## 2013-12-15 DIAGNOSIS — E860 Dehydration: Secondary | ICD-10-CM

## 2013-12-15 DIAGNOSIS — I10 Essential (primary) hypertension: Secondary | ICD-10-CM | POA: Diagnosis present

## 2013-12-15 DIAGNOSIS — R1110 Vomiting, unspecified: Secondary | ICD-10-CM

## 2013-12-15 DIAGNOSIS — D696 Thrombocytopenia, unspecified: Secondary | ICD-10-CM | POA: Diagnosis present

## 2013-12-15 DIAGNOSIS — D539 Nutritional anemia, unspecified: Secondary | ICD-10-CM | POA: Diagnosis present

## 2013-12-15 DIAGNOSIS — K7460 Unspecified cirrhosis of liver: Secondary | ICD-10-CM

## 2013-12-15 DIAGNOSIS — F17200 Nicotine dependence, unspecified, uncomplicated: Secondary | ICD-10-CM | POA: Diagnosis present

## 2013-12-15 DIAGNOSIS — K7689 Other specified diseases of liver: Secondary | ICD-10-CM | POA: Diagnosis present

## 2013-12-15 DIAGNOSIS — R112 Nausea with vomiting, unspecified: Secondary | ICD-10-CM

## 2013-12-15 DIAGNOSIS — E876 Hypokalemia: Secondary | ICD-10-CM

## 2013-12-15 DIAGNOSIS — A0472 Enterocolitis due to Clostridium difficile, not specified as recurrent: Principal | ICD-10-CM | POA: Diagnosis present

## 2013-12-15 DIAGNOSIS — K529 Noninfective gastroenteritis and colitis, unspecified: Secondary | ICD-10-CM

## 2013-12-15 DIAGNOSIS — D849 Immunodeficiency, unspecified: Secondary | ICD-10-CM | POA: Diagnosis present

## 2013-12-15 DIAGNOSIS — R109 Unspecified abdominal pain: Secondary | ICD-10-CM

## 2013-12-15 HISTORY — DX: Gastro-esophageal reflux disease without esophagitis: K21.9

## 2013-12-15 HISTORY — DX: Anemia, unspecified: D64.9

## 2013-12-15 HISTORY — DX: Headache: R51

## 2013-12-15 LAB — COMPREHENSIVE METABOLIC PANEL
ALBUMIN: 3.5 g/dL (ref 3.5–5.2)
ALT: 105 U/L — AB (ref 0–35)
AST: 271 U/L — AB (ref 0–37)
Alkaline Phosphatase: 275 U/L — ABNORMAL HIGH (ref 39–117)
BUN: 3 mg/dL — ABNORMAL LOW (ref 6–23)
CALCIUM: 9.4 mg/dL (ref 8.4–10.5)
CO2: 24 meq/L (ref 19–32)
Chloride: 93 mEq/L — ABNORMAL LOW (ref 96–112)
Creatinine, Ser: 0.6 mg/dL (ref 0.50–1.10)
GFR calc Af Amer: >90 mL/min (ref >90)
Glucose, Bld: 148 mg/dL — ABNORMAL HIGH (ref 70–99)
Potassium: 3.4 mEq/L — ABNORMAL LOW (ref 3.7–5.3)
SODIUM: 136 meq/L — AB (ref 137–147)
Total Bilirubin: 1.4 mg/dL — ABNORMAL HIGH (ref 0.3–1.2)
Total Protein: 7.3 g/dL (ref 6.0–8.3)

## 2013-12-15 LAB — LIPASE, BLOOD: Lipase: 34 U/L (ref 11–59)

## 2013-12-15 LAB — CBC WITH DIFFERENTIAL/PLATELET
Basophils Absolute: 0 10*3/uL (ref 0.0–0.1)
Basophils Relative: 0 % (ref 0–1)
EOS PCT: 1 % (ref 0–5)
Eosinophils Absolute: 0.2 10*3/uL (ref 0.0–0.7)
HCT: 33.7 % — ABNORMAL LOW (ref 36.0–46.0)
HEMOGLOBIN: 12 g/dL (ref 12.0–15.0)
LYMPHS PCT: 5 % — AB (ref 12–46)
Lymphs Abs: 0.9 10*3/uL (ref 0.7–4.0)
MCH: 39.6 pg — ABNORMAL HIGH (ref 26.0–34.0)
MCHC: 35.6 g/dL (ref 30.0–36.0)
MCV: 111.2 fL — ABNORMAL HIGH (ref 78.0–100.0)
Monocytes Absolute: 0.9 10*3/uL (ref 0.1–1.0)
Monocytes Relative: 5 % (ref 3–12)
NEUTROS PCT: 89 % — AB (ref 43–77)
Neutro Abs: 15.5 10*3/uL — ABNORMAL HIGH (ref 1.7–7.7)
PLATELETS: 177 10*3/uL (ref 150–400)
RBC: 3.03 MIL/uL — AB (ref 3.87–5.11)
RDW: 14.7 % (ref 11.5–15.5)
WBC: 17.5 10*3/uL — AB (ref 4.0–10.5)

## 2013-12-15 LAB — URINALYSIS, ROUTINE W REFLEX MICROSCOPIC
Bilirubin Urine: NEGATIVE
GLUCOSE, UA: NEGATIVE mg/dL
HGB URINE DIPSTICK: NEGATIVE
Ketones, ur: NEGATIVE mg/dL
LEUKOCYTES UA: NEGATIVE
Nitrite: NEGATIVE
PH: 7.5 (ref 5.0–8.0)
PROTEIN: NEGATIVE mg/dL
Specific Gravity, Urine: 1.003 — ABNORMAL LOW (ref 1.005–1.030)
Urobilinogen, UA: 0.2 mg/dL (ref 0.0–1.0)

## 2013-12-15 MED ORDER — CIPROFLOXACIN IN D5W 400 MG/200ML IV SOLN
400.0000 mg | Freq: Once | INTRAVENOUS | Status: AC
  Administered 2013-12-15: 400 mg via INTRAVENOUS
  Filled 2013-12-15: qty 200

## 2013-12-15 MED ORDER — LORAZEPAM 2 MG/ML IJ SOLN
1.0000 mg | Freq: Once | INTRAMUSCULAR | Status: AC
  Administered 2013-12-15: 1 mg via INTRAVENOUS
  Filled 2013-12-15: qty 1

## 2013-12-15 MED ORDER — DIAZEPAM 5 MG/ML IJ SOLN: 5.0000 mg | Freq: Once | INTRAMUSCULAR | Status: DC

## 2013-12-15 MED ORDER — SODIUM CHLORIDE 0.9 % IV BOLUS (SEPSIS)
1000.0000 mL | Freq: Once | INTRAVENOUS | Status: AC
  Administered 2013-12-15: 1000 mL via INTRAVENOUS

## 2013-12-15 MED ORDER — SODIUM CHLORIDE 0.9 % IV SOLN
Freq: Once | INTRAVENOUS | Status: AC
  Administered 2013-12-15: 18:00:00 via INTRAVENOUS

## 2013-12-15 MED ORDER — METRONIDAZOLE IN NACL 5-0.79 MG/ML-% IV SOLN
500.0000 mg | Freq: Once | INTRAVENOUS | Status: AC
  Administered 2013-12-15: 500 mg via INTRAVENOUS
  Filled 2013-12-15: qty 100

## 2013-12-15 MED ORDER — NICOTINE 21 MG/24HR TD PT24
21.0000 mg | MEDICATED_PATCH | Freq: Every day | TRANSDERMAL | Status: DC
  Administered 2013-12-15: 21 mg via TRANSDERMAL
  Filled 2013-12-15: qty 1

## 2013-12-15 MED ORDER — HYDROMORPHONE HCL PF 1 MG/ML IJ SOLN
1.0000 mg | Freq: Once | INTRAMUSCULAR | Status: AC
  Administered 2013-12-15: 1 mg via INTRAVENOUS
  Filled 2013-12-15: qty 1

## 2013-12-15 MED ORDER — ONDANSETRON HCL 4 MG/2ML IJ SOLN
4.0000 mg | Freq: Once | INTRAMUSCULAR | Status: AC
  Administered 2013-12-15: 4 mg via INTRAVENOUS
  Filled 2013-12-15: qty 2

## 2013-12-15 MED ORDER — SODIUM CHLORIDE 0.9 % IV SOLN: INTRAVENOUS | Status: DC

## 2013-12-15 MED ORDER — PROMETHAZINE HCL 25 MG/ML IJ SOLN
25.0000 mg | Freq: Once | INTRAMUSCULAR | Status: AC
  Administered 2013-12-15: 25 mg via INTRAVENOUS
  Filled 2013-12-15: qty 1

## 2013-12-15 NOTE — ED Notes (Signed)
Report given to RN at [facility] 

## 2013-12-15 NOTE — ED Provider Notes (Signed)
CSN: [id number]     Arrival date & time 12/15/13  1600 History   First MD Initiated Contact with Patient 12/15/13 1612     Chief Complaint  Patient presents with  . Abdominal Pain  . Emesis     (Consider location/radiation/quality/duration/timing/severity/associated sxs/prior Treatment) The history is provided by the patient.  Jamie Allen is a 43 y.o. female hx of NASH, chronic abdominal pain here with worsening of her abdominal pain. Patient came here 3 days ago and was diagnosed with elevated liver function tests and ultrasound showed hepatic steatosis, as well as uterine tract infections and placed on Keflex and given Zofran and Phenergan. She's been using her Zofran but she still has been vomiting and unable to keep anything down. She also has worsening of her chronic lower abdominal pain. Some loose stools but no obvious diarrhea. She also has some dysuria and hematuria. She was unable to keep the keflex down. Denies any chest pain or shortness of breath. Upon review of records she had 2 normal CTs in the last year and 2 ultrasounds that showed hepatic steatosis. She has PMD and GI f/u and is scheduled for colonoscopy in a week.    Past Medical History  Diagnosis Date  . Hypertension   . Nonalcoholic steatohepatitis (NASH)   . Immune deficiency disorder    Past Surgical History  Procedure Laterality Date  . Tonsillectomy     No family history on file. History  Substance Use Topics  . Smoking status: Current Every Day Smoker -- 0.50 packs/day    Types: Cigarettes  . Smokeless tobacco: Not on file  . Alcohol Use: Yes     Comment: occassionally   OB History   Grav Para Term Preterm Abortions TAB SAB Ect Mult Living                 Review of Systems  Gastrointestinal: Positive for vomiting and abdominal pain.  All other systems reviewed and are negative.      Allergies  Iodine and Morphine and related  Home Medications   Current Outpatient Rx  Name  Route   Sig  Dispense  Refill  . cephALEXin (KEFLEX) 500 MG capsule   Oral   Take 1 capsule (500 mg total) by mouth 2 (two) times daily.   14 capsule   0   . cholestyramine (QUESTRAN) 4 G packet   Oral   Take 1 packet by mouth daily.          Marland Kitchen dicyclomine (BENTYL) 10 MG capsule   Oral   Take 10 mg by mouth daily.         . metoCLOPramide (REGLAN) 10 MG tablet   Oral   Take 10 mg by mouth every 6 (six) hours as needed for nausea.         . ondansetron (ZOFRAN ODT) 4 MG disintegrating tablet   Oral   Take 1 tablet (4 mg total) by mouth every 8 (eight) hours as needed for nausea or vomiting.   20 tablet   0   . oxycodone (OXY-IR) 5 MG capsule   Oral   Take 1 capsule (5 mg total) by mouth every 6 (six) hours as needed.   15 capsule   0   . potassium chloride SA (K-DUR,KLOR-CON) 20 MEQ tablet   Oral   Take 2 tablets (40 mEq total) by mouth 2 (two) times daily.   60 tablet   2   . promethazine (PHENERGAN) 25 MG suppository  Rectal   Place 1 suppository (25 mg total) rectally every 6 (six) hours as needed for nausea or vomiting.   12 each   0     Use if you are not tolerating the Ondansetron   . senna-docusate (SENOKOT-S) 8.6-50 MG per tablet   Oral   Take 2 tablets by mouth daily.   30 tablet   0    BP 132/112  Pulse 125  Temp(Src) 98.2 F (36.8 C) (Oral)  Resp 20  Wt 115 lb (52.164 kg)  SpO2 97% Physical Exam  Nursing note and vitals reviewed. Constitutional: She is oriented to person, place, and time.  Vomiting, uncomfortable   HENT:  Head: Normocephalic.  Mouth/Throat: Oropharynx is clear and moist.  Eyes: Conjunctivae are normal. Pupils are equal, round, and reactive to light.  Neck: Normal range of motion. Neck supple.  Cardiovascular: Regular rhythm and normal heart sounds.   Tachycardic   Pulmonary/Chest: Effort normal and breath sounds normal. No respiratory distress. She has no wheezes. She has no rales.  Abdominal:  Slightly distended, +  mild diffuse tenderness, worse in lower quadrants. No rebound. No CVAT   Musculoskeletal: Normal range of motion. She exhibits no edema.  Neurological: She is alert and oriented to person, place, and time. No cranial nerve deficit. Coordination normal.  Skin: Skin is warm and dry.  Psychiatric: She has a normal mood and affect. Her behavior is normal. Judgment and thought content normal.    ED Course  Procedures (including critical care time) Labs Review Labs Reviewed  CBC WITH DIFFERENTIAL - Abnormal; Notable for the following:    WBC 17.5 (*)    RBC 3.03 (*)    HCT 33.7 (*)    MCV 111.2 (*)    MCH 39.6 (*)    Neutrophils Relative % 89 (*)    Lymphocytes Relative 5 (*)    Neutro Abs 15.5 (*)    All other components within normal limits  COMPREHENSIVE METABOLIC PANEL - Abnormal; Notable for the following:    Sodium 136 (*)    Potassium 3.4 (*)    Chloride 93 (*)    Glucose, Bld 148 (*)    BUN 3 (*)    AST 271 (*)    ALT 105 (*)    Alkaline Phosphatase 275 (*)    Total Bilirubin 1.4 (*)    All other components within normal limits  URINALYSIS, ROUTINE W REFLEX MICROSCOPIC - Abnormal; Notable for the following:    Specific Gravity, Urine 1.003 (*)    All other components within normal limits  LIPASE, BLOOD   Imaging Review Ct Abdomen Pelvis Wo Contrast  12/15/2013   CLINICAL DATA:  Lower abdominal pain, nausea and vomiting, with diarrhea, and with hematuria.  EXAM: CT ABDOMEN AND PELVIS WITHOUT CONTRAST  TECHNIQUE: Multidetector CT imaging of the abdomen and pelvis was performed following the standard protocol without intravenous contrast.  COMPARISON:  US ABDOMEN COMPLETE dated 12/12/2013; CT ABD/PELVIS W CM dated 11/11/2013; CT ABD/PELVIS W CM dated 07/25/2013  FINDINGS: Marked hepatic steatosis. Slight hepatomegaly. Normal spleen. Mild gallbladder distention without visible stones. No biliary ductal dilatation. Normal-appearing pancreas and adrenal glands. Lung apices clear.   Prominent left-sided renal scarring is noted with slight left renal atrophy; the right kidney is hypertrophied. No hydronephrosis is seen on the right or left. There are no renal or ureteral stones. There is no perinephric stranding to suggest previous stone passage.  The appendix is normal. The colon appears unusually smooth, particularly  in the descending and sigmoid segments, compared with October 2014 with fluid-filled throughout, mild wall thickening, but no obstruction and no pericolonic inflammation. Considerations would include inflammatory bowel disease, autoimmune colitis, pseudomembranous colitis, or infectious colitis. Compared with 11/11/2013, the findings have also progressed.  Physiologic slightly greater than 2 cm left adnexal cyst may be slightly smaller. No free fluid no retroperitoneal, mesenteric, or inguinal adenopathy.  IMPRESSION: Nonspecific fluid-filled colon with mild loss of haustral markings, mild wall thickening, but no obstruction or pericolonic inflammation. Nonspecific findings of colitis are suggested.  Left renal scarring is stable.  No renal obstruction or calculi.  Marked hepatic steatosis with slight hepatomegaly also stable.   Electronically Signed   By: Rolla Flatten M.D.   On: 12/15/2013 17:50    EKG Interpretation   None       MDM   Final diagnoses:  Colitis  Cirrhosis  Vomiting  Dehydration   Montine Kamla Skilton is a 43 y.o. female here with worsening of chronic abdominal pain. Abdomen diffusely tender, but that is chronic for her. Will repeat labs and give pain meds. Given multiple normal CTs and [name] will hold off imaging unless labs are worse than usual.   6:10 PM WBC 17, still uncomfortable and unable to tolerate fluids despite pain meds and antiemetics. CT showed colitis, given cipro, flagyl. LFTs stable. Will admit for colitis, intractable vomiting, dehydration. Will transfer to [facility] med/surg under Dr. Doyle Askew.    Wandra Arthurs, MD 12/15/13 [phone]

## 2013-12-15 NOTE — Progress Notes (Signed)
Pt is 43 yo female who presented to [place] with progressively worsening abdominal pain associated with poor oral intake, nausea and non bloody vomiting. CT abd with colitis. Pt started on Cipro and Flagyl and TRh asked to admit for further evaluation, med surg bed requested.   Faye Ramsay, MD  [facility] Pager [phone]  If 7PM-7AM, please contact night-coverage [email or web address] Password [email or web address]

## 2013-12-15 NOTE — ED Notes (Signed)
MD at bedside. 

## 2013-12-15 NOTE — ED Notes (Signed)
Report given to [facility] 

## 2013-12-15 NOTE — ED Notes (Signed)
Pt reports abdominal pain, vomiting and unable to keep PO fluids down.

## 2013-12-16 DIAGNOSIS — K5289 Other specified noninfective gastroenteritis and colitis: Secondary | ICD-10-CM

## 2013-12-16 DIAGNOSIS — R112 Nausea with vomiting, unspecified: Secondary | ICD-10-CM

## 2013-12-16 DIAGNOSIS — K7581 Nonalcoholic steatohepatitis (NASH): Secondary | ICD-10-CM | POA: Diagnosis present

## 2013-12-16 DIAGNOSIS — K7689 Other specified diseases of liver: Secondary | ICD-10-CM

## 2013-12-16 DIAGNOSIS — K7460 Unspecified cirrhosis of liver: Secondary | ICD-10-CM

## 2013-12-16 DIAGNOSIS — R197 Diarrhea, unspecified: Secondary | ICD-10-CM

## 2013-12-16 DIAGNOSIS — R109 Unspecified abdominal pain: Secondary | ICD-10-CM

## 2013-12-16 LAB — VITAMIN B12: Vitamin B-12: 1725 pg/mL — ABNORMAL HIGH (ref 211–911)

## 2013-12-16 LAB — COMPREHENSIVE METABOLIC PANEL
ALT: 59 U/L — AB (ref 0–35)
AST: 135 U/L — ABNORMAL HIGH (ref 0–37)
Albumin: 2.2 g/dL — ABNORMAL LOW (ref 3.5–5.2)
Alkaline Phosphatase: 177 U/L — ABNORMAL HIGH (ref 39–117)
BUN: 3 mg/dL — ABNORMAL LOW (ref 6–23)
CALCIUM: 7.3 mg/dL — AB (ref 8.4–10.5)
CO2: 21 mEq/L (ref 19–32)
Chloride: 106 mEq/L (ref 96–112)
Creatinine, Ser: 0.57 mg/dL (ref 0.50–1.10)
GFR calc non Af Amer: >90 mL/min (ref >90)
Glucose, Bld: 115 mg/dL — ABNORMAL HIGH (ref 70–99)
Potassium: 3.1 mEq/L — ABNORMAL LOW (ref 3.7–5.3)
SODIUM: 139 meq/L (ref 137–147)
TOTAL PROTEIN: 5.1 g/dL — AB (ref 6.0–8.3)
Total Bilirubin: 1 mg/dL (ref 0.3–1.2)

## 2013-12-16 LAB — CBC
HCT: 26.8 % — ABNORMAL LOW (ref 36.0–46.0)
Hemoglobin: 9.3 g/dL — ABNORMAL LOW (ref 12.0–15.0)
MCH: 39.4 pg — ABNORMAL HIGH (ref 26.0–34.0)
MCHC: 34.7 g/dL (ref 30.0–36.0)
MCV: 113.6 fL — ABNORMAL HIGH (ref 78.0–100.0)
PLATELETS: 138 10*3/uL — AB (ref 150–400)
RBC: 2.36 MIL/uL — AB (ref 3.87–5.11)
RDW: 15.7 % — ABNORMAL HIGH (ref 11.5–15.5)
WBC: 10.7 10*3/uL — AB (ref 4.0–10.5)

## 2013-12-16 LAB — GAMMA GT: GGT: 1395 U/L — AB (ref 7–51)

## 2013-12-16 LAB — CLOSTRIDIUM DIFFICILE BY PCR: Toxigenic C. Difficile by PCR: POSITIVE — AB

## 2013-12-16 LAB — PROTIME-INR
INR: 1.23 (ref 0.00–1.49)
Prothrombin Time: 15.2 seconds (ref 11.6–15.2)

## 2013-12-16 MED ORDER — ENOXAPARIN SODIUM 40 MG/0.4ML ~~LOC~~ SOLN
40.0000 mg | SUBCUTANEOUS | Status: DC
Start: 2013-12-16 — End: 2013-12-23
  Administered 2013-12-16 – 2013-12-22 (×7): 40 mg via SUBCUTANEOUS
  Filled 2013-12-16 (×8): qty 0.4

## 2013-12-16 MED ORDER — SODIUM CHLORIDE 0.9 % IV SOLN: INTRAVENOUS | Status: DC

## 2013-12-16 MED ORDER — NICOTINE 21 MG/24HR TD PT24
21.0000 mg | MEDICATED_PATCH | Freq: Every day | TRANSDERMAL | Status: DC
  Administered 2013-12-16 – 2013-12-23 (×8): 21 mg via TRANSDERMAL
  Filled 2013-12-16 (×8): qty 1

## 2013-12-16 MED ORDER — PANTOPRAZOLE SODIUM 40 MG IV SOLR
40.0000 mg | Freq: Two times a day (BID) | INTRAVENOUS | Status: DC
  Administered 2013-12-16 (×2): 40 mg via INTRAVENOUS
  Filled 2013-12-16 (×3): qty 40

## 2013-12-16 MED ORDER — VANCOMYCIN 50 MG/ML ORAL SOLUTION: 125.0000 mg | Freq: Four times a day (QID) | ORAL | Status: DC

## 2013-12-16 MED ORDER — SODIUM CHLORIDE 0.9 % IV SOLN
INTRAVENOUS | Status: DC
  Administered 2013-12-16 – 2013-12-21 (×7): via INTRAVENOUS
  Filled 2013-12-16 (×13): qty 1000

## 2013-12-16 MED ORDER — POTASSIUM CHLORIDE IN NACL 20-0.9 MEQ/L-% IV SOLN
INTRAVENOUS | Status: DC
  Administered 2013-12-16: 02:00:00 via INTRAVENOUS
  Filled 2013-12-16 (×3): qty 1000

## 2013-12-16 MED ORDER — ONDANSETRON 8 MG/NS 50 ML IVPB
8.0000 mg | Freq: Four times a day (QID) | INTRAVENOUS | Status: DC | PRN
  Administered 2013-12-18 – 2013-12-19 (×2): 8 mg via INTRAVENOUS
  Filled 2013-12-16 (×2): qty 8

## 2013-12-16 MED ORDER — HYDROMORPHONE HCL PF 1 MG/ML IJ SOLN
0.5000 mg | INTRAMUSCULAR | Status: DC | PRN
  Administered 2013-12-16 – 2013-12-18 (×13): 1 mg via INTRAVENOUS
  Filled 2013-12-16 (×13): qty 1

## 2013-12-16 MED ORDER — ONDANSETRON HCL 4 MG PO TABS
4.0000 mg | ORAL_TABLET | Freq: Four times a day (QID) | ORAL | Status: DC | PRN
  Administered 2013-12-23: 4 mg via ORAL
  Filled 2013-12-16 (×2): qty 1

## 2013-12-16 MED ORDER — METRONIDAZOLE IN NACL 5-0.79 MG/ML-% IV SOLN
500.0000 mg | Freq: Three times a day (TID) | INTRAVENOUS | Status: DC
  Administered 2013-12-16 – 2013-12-20 (×14): 500 mg via INTRAVENOUS
  Filled 2013-12-16 (×16): qty 100

## 2013-12-16 MED ORDER — VANCOMYCIN 50 MG/ML ORAL SOLUTION
125.0000 mg | Freq: Four times a day (QID) | ORAL | Status: DC
  Administered 2013-12-16 – 2013-12-20 (×12): 125 mg via ORAL
  Filled 2013-12-16 (×19): qty 2.5

## 2013-12-16 MED ORDER — ZOLPIDEM TARTRATE 5 MG PO TABS
5.0000 mg | ORAL_TABLET | Freq: Once | ORAL | Status: AC
  Administered 2013-12-16: 5 mg via ORAL
  Filled 2013-12-16: qty 1

## 2013-12-16 MED ORDER — POTASSIUM CHLORIDE 20 MEQ/15ML (10%) PO LIQD
40.0000 meq | Freq: Two times a day (BID) | ORAL | Status: DC
  Filled 2013-12-16 (×2): qty 30

## 2013-12-16 MED ORDER — CIPROFLOXACIN IN D5W 400 MG/200ML IV SOLN
400.0000 mg | Freq: Two times a day (BID) | INTRAVENOUS | Status: DC
  Administered 2013-12-16: 400 mg via INTRAVENOUS
  Filled 2013-12-16 (×2): qty 200

## 2013-12-16 NOTE — Progress Notes (Deleted)
PROGRESS NOTE  Jamie Allen WGN:[id number] DOB: 12/16/1970 DOA: 12/15/2013 PCP: Angelica Chessman, MD  HPI/Subjective: 43 yo female with PMH of GERD, NASH, and chronic abdominal pain presented to ED on 2/26 with nausea, vomiting, diarrhea and cramping abdominal pain. She reports this has been going on intermittently for the past 8 months with occasional hematochezia and heatemesis. She was seen in the ED in January with similar complaints and a CT showed colonic thickening without any inflammation. She is scheduled for a colonoscopy on March 5th. She started Keflex on 2/23 for a UTI.  Patient reports she has had 5 loose watery bowel movements overnight and no vomiting. She remains afebrile. She appears anxious and is currently complaining of nausea and pain.   Assessment/Plan:  Colitis -Patient with diarrhea and abdominal pain -CT revealed colon with mild loss of haustral markings, mild wall thickening, and obstruction or pericolonic  inflammation.  -Pt has leukocytosis (WBC 10.6) -Continue Cipro and Flagyl -Continue IV hydration, antiemetics and PPI -C. Diff and GI pathogen panel pending -Consult Dr. Oletta Lamas of GI today 2/27  Vomiting -? Hepatomegaly contributing? -Patient reports intermittent vomiting with coffee grounds x 8 months -Upper endoscopy was done in 09/2013 at [facility] = Normal. -She reports a 17 lbs in the past month. -Supportive care with Zofran, PPI  NASH -Patient with elevated LFTs -Monitor -Avoid hepatotoxic medications  UTI -Patient started keflex 2/23 -UA on 2/26 much improved from UA on 2/23 -Continue ciprofloxacin  Hx/o Immune Def. Disorder.  Macrocytic Anemia with thrombocytopenia. -Will check B12, Ferritin -Monitor Hgb. -Check GGT   DVT Prophylaxis:  Lovenox  Code Status: Full Code Family Communication: Patient is awake, alert, and oriented. No family members present. Disposition Plan: Inpatient   Consultants:  Dr. Oletta Lamas of  Gastroenterology  Procedures:  CT Scan 2/26   Antibiotics:  Ciprofloxacin, Flagyl  Objective: Filed Vitals:   12/15/13 1854 12/15/13 2122 12/15/13 2240 12/16/13 0430  BP: 112/68 108/75 106/44 104/58  Pulse: 110 105 114 103  Temp:  99.3 F (37.4 C) 98.7 F (37.1 C) 98.6 F (37 C)  TempSrc:  Oral Oral Oral  Resp: 20 20 20 18   Height:   5\' 2"  (1.575 m)   Weight:   52.17 kg (115 lb 0.2 oz) 52.2 kg (115 lb 1.3 oz)  SpO2: 96%  97% 96%    Intake/Output Summary (Last 24 hours) at 12/16/13 1033 Last data filed at 12/16/13 0700  Gross per 24 hour  Intake   1085 ml  Output      1 ml  Net   1084 ml   Filed Weights   12/15/13 1620 12/15/13 2240 12/16/13 0430  Weight: 52.164 kg (115 lb) 52.17 kg (115 lb 0.2 oz) 52.2 kg (115 lb 1.3 oz)    Exam: General: Well developed, well nourished, anxious appearing. Non toxic. HEENT:  PERRL, No pharyngeal erythema or exudates, moist oral mucosa. Poor dentition Neck: Supple, no JVD, no masses.  Cardiovascular: RRR, S1 S2 auscultated, no rubs, murmurs or gallops.   Respiratory: Clear to auscultation bilaterally with equal chest rise.  Abdomen: Soft, tender to palpation especially on the right, nondistended, + bowel sounds, with hepatomegaly.  Extremities: Warm dry without cyanosis clubbing or edema.  Neuro: AAOx3, cranial nerves grossly intact. Strength 5/5 in upper and lower extremities  Skin: Without rashes exudates or nodules.   Psych: Patient appears anxious and reports pain inconsistent with findings on physical exam and imaging.  Data Reviewed: Basic Metabolic Panel:  Recent Labs  Lab 12/12/13 1625 12/15/13 1625 12/16/13 0540  NA 139 136* 139  K 4.0 3.4* 3.1*  CL 95* 93* 106  CO2 23 24 21   GLUCOSE 150* 148* 115*  BUN 2* 3* 3*  CREATININE 0.60 0.60 0.57  CALCIUM 9.6 9.4 7.3*   Liver Function Tests:  Recent Labs Lab 12/12/13 1625 12/15/13 1625 12/16/13 0540  AST 306* 271* 135*  ALT 133* 105* 59*  ALKPHOS 278* 275*  177*  BILITOT 1.8* 1.4* 1.0  PROT 7.1 7.3 5.1*  ALBUMIN 3.7 3.5 2.2*    Recent Labs Lab 12/12/13 1625 12/15/13 1625  LIPASE 43 34   CBC:  Recent Labs Lab 12/12/13 1625 12/15/13 1625 12/16/13 0540  WBC 6.8 17.5* 10.7*  NEUTROABS 5.2 15.5*  --   HGB 12.4 12.0 9.3*  HCT 34.6* 33.7* 26.8*  MCV 109.5* 111.2* 113.6*  PLT 189 177 138*    Studies: Ct Abdomen Pelvis Wo Contrast  12/15/2013   CLINICAL DATA:  Lower abdominal pain, nausea and vomiting, with diarrhea, and with hematuria.  EXAM: CT ABDOMEN AND PELVIS WITHOUT CONTRAST  TECHNIQUE: Multidetector CT imaging of the abdomen and pelvis was performed following the standard protocol without intravenous contrast.  COMPARISON:  US ABDOMEN COMPLETE dated 12/12/2013; CT ABD/PELVIS W CM dated 11/11/2013; CT ABD/PELVIS W CM dated 07/25/2013  FINDINGS: Marked hepatic steatosis. Slight hepatomegaly. Normal spleen. Mild gallbladder distention without visible stones. No biliary ductal dilatation. Normal-appearing pancreas and adrenal glands. Lung apices clear.  Prominent left-sided renal scarring is noted with slight left renal atrophy; the right kidney is hypertrophied. No hydronephrosis is seen on the right or left. There are no renal or ureteral stones. There is no perinephric stranding to suggest previous stone passage.  The appendix is normal. The colon appears unusually smooth, particularly in the descending and sigmoid segments, compared with October 2014 with fluid-filled throughout, mild wall thickening, but no obstruction and no pericolonic inflammation. Considerations would include inflammatory bowel disease, autoimmune colitis, pseudomembranous colitis, or infectious colitis. Compared with 11/11/2013, the findings have also progressed.  Physiologic slightly greater than 2 cm left adnexal cyst may be slightly smaller. No free fluid no retroperitoneal, mesenteric, or inguinal adenopathy.  IMPRESSION: Nonspecific fluid-filled colon with mild loss  of haustral markings, mild wall thickening, but no obstruction or pericolonic inflammation. Nonspecific findings of colitis are suggested.  Left renal scarring is stable.  No renal obstruction or calculi.  Marked hepatic steatosis with slight hepatomegaly also stable.   Electronically Signed   By: Rolla Flatten M.D.   On: 12/15/2013 17:50    Scheduled Meds: . ciprofloxacin  400 mg Intravenous Q12H  . enoxaparin (LOVENOX) injection  40 mg Subcutaneous Q24H  . metronidazole  500 mg Intravenous Q8H  . nicotine  21 mg Transdermal Daily  . pantoprazole (PROTONIX) IV  40 mg Intravenous Q12H  . potassium chloride  40 mEq Oral BID   Continuous Infusions:    Principal Problem:   Colitis Active Problems:   Nausea with vomiting   NASH (nonalcoholic steatohepatitis)    Ruben Im PA-S  Imogene Burn, PA-C [facility] Pager [phone]. If 7PM-7AM, please contact night-coverage at [email or web address], password [email or web address] 12/16/2013, 10:33 AM  LOS: 1 day

## 2013-12-16 NOTE — H&P (Addendum)
[facility] History and Physical  Patient: Jamie Allen  WLS:[id number]  DOB: [birth year]  DOS: the patient was seen and examined on 12/16/2013 PCP: Angelica Chessman, MD  Chief Complaint: abdominal pain and diarrhea  HPI: [name] is a 43 y.o. female with Past medical history of GERD, NASH, chronic abdominal pain. The patient is coming from home. The patient presented with complaints of abdominal pain nausea vomiting and diarrhea. She mentions that she was constipated for 3 days and today started having loose watery bowel movements x3 without any blood she also had some mucus in her bowels. She had burning urination yesterday and has had fever with chills and diaphoresis since last 2 days. Due to recurrent nausea and vomiting she is unable to keep her medications down. She has been seen in the ED with similar complaints in the past in January her CT scan showed colonic thickening without any inflammation. She is scheduled for a colonoscopy on March 5. She was recently diagnosed with UTI and was given Keflex on 23rd February.  Review of Systems: as mentioned in the history of present illness.  A Comprehensive review of the other systems is negative.  Past Medical History  Diagnosis Date  . Hypertension   . Nonalcoholic steatohepatitis (NASH)   . Immune deficiency disorder   . GERD (gastroesophageal reflux disease)   . Headache(784.0)   . Anemia    Past Surgical History  Procedure Laterality Date  . Tonsillectomy     Social History:  reports that she has been smoking Cigarettes.  She has been smoking about 0.50 packs per day. She does not have any smokeless tobacco history on file. She reports that she does not drink alcohol or use illicit drugs. Independent for most of her  ADL.  Allergies  Allergen Reactions  . Iodine Swelling  . Morphine And Related Other (See Comments)    unknown    History reviewed. No pertinent family history.  Prior to  Admission medications   Medication Sig Start Date End Date Taking? Authorizing Provider  cephALEXin (KEFLEX) 500 MG capsule Take 500 mg by mouth 2 (two) times daily.   Yes Historical Provider, MD  cholestyramine Lucrezia Starch) 4 G packet Take 1 packet by mouth daily.    Yes Historical Provider, MD  diphenhydrAMINE (BENADRYL) 25 mg capsule Take 25 mg by mouth daily as needed for allergies.   Yes Historical Provider, MD  Esomeprazole Magnesium (NEXIUM PO) Take 22.3 mg by mouth daily. *otc nexium strength*   Yes Historical Provider, MD  nicotine (NICODERM CQ - DOSED IN MG/24 HOURS) 21 mg/24hr patch Place 21 mg onto the skin daily.   Yes Historical Provider, MD  ondansetron (ZOFRAN-ODT) 4 MG disintegrating tablet Take 4 mg by mouth every 8 (eight) hours as needed for nausea or vomiting.   Yes Historical Provider, MD  oxyCODONE (OXY IR/ROXICODONE) 5 MG immediate release tablet Take 5 mg by mouth every 6 (six) hours as needed for severe pain.   Yes Historical Provider, MD  potassium chloride SA (K-DUR,KLOR-CON) 20 MEQ tablet Take 40 mEq by mouth 2 (two) times daily.   Yes Historical Provider, MD  senna-docusate (SENOKOT-S) 8.6-50 MG per tablet Take 1 tablet by mouth daily as needed for mild constipation.   Yes Historical Provider, MD    Physical Exam: Filed Vitals:   12/15/13 1620 12/15/13 1854 12/15/13 2122 12/15/13 2240  BP: 132/112 112/68 108/75 106/44  Pulse: 125 110 105 114  Temp: 98.2 F (36.8 C)  99.3  F (37.4 C) 98.7 F (37.1 C)  TempSrc: Oral  Oral Oral  Resp: 20 20 20 20   Height:    5' 2"  (1.575 m)  Weight: 52.164 kg (115 lb)   52.17 kg (115 lb 0.2 oz)  SpO2: 97% 96%  97%    General: Alert, Awake and Oriented to Time, Place and Person. Appear in mild distress Eyes: PERRL ENT: Oral Mucosa clear moist. Neck: no JVD Cardiovascular: S1 and S2 Present, no Murmur, Peripheral Pulses Present Respiratory: Bilateral Air entry equal and Decreased, Clear to Auscultation,  no Crackles,no  wheezes Abdomen: Bowel Sound Present, Soft and diffuse tender,voluntry guarding no rigidity Skin: no Rash Extremities: no Pedal edema, no calf tenderness Neurologic: Grossly Unremarkable., Appears anxious  Labs on Admission:  CBC:  Recent Labs Lab 12/12/13 1625 12/15/13 1625  WBC 6.8 17.5*  NEUTROABS 5.2 15.5*  HGB 12.4 12.0  HCT 34.6* 33.7*  MCV 109.5* 111.2*  PLT 189 177    CMP     Component Value Date/Time   NA 136* 12/15/2013 1625   K 3.4* 12/15/2013 1625   CL 93* 12/15/2013 1625   CO2 24 12/15/2013 1625   GLUCOSE 148* 12/15/2013 1625   BUN 3* 12/15/2013 1625   CREATININE 0.60 12/15/2013 1625   CREATININE 0.76 12/02/2013 1408   CALCIUM 9.4 12/15/2013 1625   PROT 7.3 12/15/2013 1625   ALBUMIN 3.5 12/15/2013 1625   AST 271* 12/15/2013 1625   ALT 105* 12/15/2013 1625   ALKPHOS 275* 12/15/2013 1625   BILITOT 1.4* 12/15/2013 1625   GFRNONAA >90 12/15/2013 1625   GFRAA >90 12/15/2013 1625     Recent Labs Lab 12/12/13 1625 12/15/13 1625  LIPASE 43 34   No results found for this basename: AMMONIA,  in the last 168 hours  No results found for this basename: CKTOTAL, CKMB, CKMBINDEX, TROPONINI,  in the last 168 hours BNP (last 3 results) No results found for this basename: PROBNP,  in the last 8760 hours  Radiological Exams on Admission: Ct Abdomen Pelvis Wo Contrast  12/15/2013   CLINICAL DATA:  Lower abdominal pain, nausea and vomiting, with diarrhea, and with hematuria.  EXAM: CT ABDOMEN AND PELVIS WITHOUT CONTRAST  TECHNIQUE: Multidetector CT imaging of the abdomen and pelvis was performed following the standard protocol without intravenous contrast.  COMPARISON:  US ABDOMEN COMPLETE dated 12/12/2013; CT ABD/PELVIS W CM dated 11/11/2013; CT ABD/PELVIS W CM dated 07/25/2013  FINDINGS: Marked hepatic steatosis. Slight hepatomegaly. Normal spleen. Mild gallbladder distention without visible stones. No biliary ductal dilatation. Normal-appearing pancreas and adrenal glands. Lung  apices clear.  Prominent left-sided renal scarring is noted with slight left renal atrophy; the right kidney is hypertrophied. No hydronephrosis is seen on the right or left. There are no renal or ureteral stones. There is no perinephric stranding to suggest previous stone passage.  The appendix is normal. The colon appears unusually smooth, particularly in the descending and sigmoid segments, compared with October 2014 with fluid-filled throughout, mild wall thickening, but no obstruction and no pericolonic inflammation. Considerations would include inflammatory bowel disease, autoimmune colitis, pseudomembranous colitis, or infectious colitis. Compared with 11/11/2013, the findings have also progressed.  Physiologic slightly greater than 2 cm left adnexal cyst may be slightly smaller. No free fluid no retroperitoneal, mesenteric, or inguinal adenopathy.  IMPRESSION: Nonspecific fluid-filled colon with mild loss of haustral markings, mild wall thickening, but no obstruction or pericolonic inflammation. Nonspecific findings of colitis are suggested.  Left renal scarring is stable.  No renal  obstruction or calculi.  Marked hepatic steatosis with slight hepatomegaly also stable.   Electronically Signed   By: Rolla Flatten M.D.   On: 12/15/2013 17:50    Assessment/Plan Principal Problem:   Colitis Active Problems:   Nausea with vomiting   NASH (nonalcoholic steatohepatitis)   1. Colitis The patient is presenting with complaints of diarrhea abdominal pain. A CT scan is showing colitis. She has leukocytosis with toxic granulation with this she most likely has an infectious etiology. I would treat her with Cipro and Flagyl. IV hydration, n.p.o. except ice chips, IV Protonix, IV Zofran as needed, IV Dilaudid as needed for pain. Ativan as needed for anxiety. At present her CT scan does not show any complication, if there is no improvement in her symptoms she may require GI consultation Check C diff  2.  NASH the patient has abnormal LFT, monitor and avoid hepatotoxic medications   3. Recent UTI ciprofloxacin  DVT Prophylaxis: subcutaneous Heparin Nutrition: N.p.o.   Code Status: Full   Disposition: Admitted to inpatient  in med-surge unit.  Author: Berle Mull, MD [facility] Pager: [phone] 12/16/2013, 12:22 AM    If 7PM-7AM, please contact night-coverage [email or web address] Password [email or web address]

## 2013-12-16 NOTE — Progress Notes (Signed)
Utilization review completed. Verdelle Valtierra, RN, BSN. 

## 2013-12-16 NOTE — Consult Note (Signed)
Jamie Allen Reason for consult: diarrhea, fatty liver, C. difficile, abdominal pain Referring Physician: Triad Allen. PCP: Jamie Allen at the [facility] is an 43 y.o. female.  HPI:  The Patient is in a hospital with her husband. She notes that she has been sick for about a year with abdominal pain and diarrhea. Initially her symptoms would come and go and then they became more persistent. She's had several ER visits office visits at the [facility]. She has a history of elevated liver tests fatty liver on ultrasound with a history of alcohol intake. Her abdominal pain however was in the lower abdomen and was associated with profuse diarrhea . At times he has had very mucosy stool. She reports 10 to 15 watery stools daily. She was treated with cholestyramine with some improvement. Her transaminases have been elevated in extensive workup was begun. Hepatitis C antibody, HIV antibody, Cerullo plasm, celiac panel, alpha-1 antitrypsin, where all normal. Ferritin was slightly elevated. Hepatitis A, B., and C antibodies were all negative. Anti SM antibody was up slightly. G.I. pathogen panel was obtained and was negative at that time. The patient was referred to [facility] and recently underwent EGD that was normal with normal duodenal biopsies. She has had multiple CT scans ultrasound to have shown fatty liver, possible cirrhosis and thickening of the colon. Most recent CT this admission showed a fluid filledcolon with wall thickening suggestive of colitis. The patient was admitted here with abdominal pain and is continued in diarrhea. She notes that she was due to go back to [facility] in March for colonoscopy. She last had Keflex several days ago for a urinary tract infection and denies other antibiotics. C. difficile toxin has returned is positive. Past Medical History  Diagnosis Date  . Hypertension   . Nonalcoholic steatohepatitis  (NASH)   . Immune deficiency disorder   . GERD (gastroesophageal reflux disease)   . Headache(784.0)   . Anemia     Past Surgical History  Procedure Laterality Date  . Tonsillectomy      History reviewed. No pertinent family history.  Social History:  reports that she has been smoking Cigarettes.  She has been smoking about 0.50 packs per day. She does not have any smokeless tobacco history on file. She reports that she does not drink alcohol or use illicit drugs.  Allergies:  Allergies  Allergen Reactions  . Iodine Swelling  . Morphine And Related Other (See Comments)    unknown    Medications; Prior to Admission medications   Medication Sig Start Date End Date Taking? Authorizing Provider  cephALEXin (KEFLEX) 500 MG capsule Take 500 mg by mouth 2 (two) times daily.   Yes Historical Provider, MD  cholestyramine Lucrezia Starch) 4 G packet Take 1 packet by mouth daily.    Yes Historical Provider, MD  diphenhydrAMINE (BENADRYL) 25 mg capsule Take 25 mg by mouth daily as needed for allergies.   Yes Historical Provider, MD  Esomeprazole Magnesium (NEXIUM PO) Take 22.3 mg by mouth daily. *otc nexium strength*   Yes Historical Provider, MD  nicotine (NICODERM CQ - DOSED IN MG/24 HOURS) 21 mg/24hr patch Place 21 mg onto the skin daily.   Yes Historical Provider, MD  ondansetron (ZOFRAN-ODT) 4 MG disintegrating tablet Take 4 mg by mouth every 8 (eight) hours as needed for nausea or vomiting.   Yes Historical Provider, MD  oxyCODONE (OXY IR/ROXICODONE) 5 MG immediate release tablet Take 5 mg by mouth every  6 (six) hours as needed for severe pain.   Yes Historical Provider, MD  potassium chloride SA (K-DUR,KLOR-CON) 20 MEQ tablet Take 40 mEq by mouth 2 (two) times daily.   Yes Historical Provider, MD  senna-docusate (SENOKOT-S) 8.6-50 MG per tablet Take 1 tablet by mouth daily as needed for mild constipation.   Yes Historical Provider, MD   . enoxaparin (LOVENOX) injection  40 mg Subcutaneous  Q24H  . metronidazole  500 mg Intravenous Q8H  . nicotine  21 mg Transdermal Daily  . vancomycin  125 mg Oral 4 times per day   PRN Meds HYDROmorphone (DILAUDID) injection, ondansetron (ZOFRAN) IV, ondansetron Results for orders placed during the hospital encounter of 12/15/13 (from the past 48 hour(s))  CBC WITH DIFFERENTIAL     Status: Abnormal   Collection Time    12/15/13  4:25 PM      Result Value Ref Range   WBC 17.5 (*) 4.0 - 10.5 K/uL   RBC 3.03 (*) 3.87 - 5.11 MIL/uL   Hemoglobin 12.0  12.0 - 15.0 g/dL   HCT 33.7 (*) 36.0 - 46.0 %   MCV 111.2 (*) 78.0 - 100.0 fL   MCH 39.6 (*) 26.0 - 34.0 pg   MCHC 35.6  30.0 - 36.0 g/dL   RDW 14.7  11.5 - 15.5 %   Platelets 177  150 - 400 K/uL   Neutrophils Relative % 89 (*) 43 - 77 %   Lymphocytes Relative 5 (*) 12 - 46 %   Monocytes Relative 5  3 - 12 %   Eosinophils Relative 1  0 - 5 %   Basophils Relative 0  0 - 1 %   Neutro Abs 15.5 (*) 1.7 - 7.7 K/uL   Lymphs Abs 0.9  0.7 - 4.0 K/uL   Monocytes Absolute 0.9  0.1 - 1.0 K/uL   Eosinophils Absolute 0.2  0.0 - 0.7 K/uL   Basophils Absolute 0.0  0.0 - 0.1 K/uL   RBC Morphology POLYCHROMASIA PRESENT     Comment: BASOPHILIC STIPPLING     STOMATOCYTES     OVAL MACROCYTES   WBC Morphology TOXIC GRANULATION     Comment: VACUOLATED NEUTROPHILS  COMPREHENSIVE METABOLIC PANEL     Status: Abnormal   Collection Time    12/15/13  4:25 PM      Result Value Ref Range   Sodium 136 (*) 137 - 147 mEq/L   Potassium 3.4 (*) 3.7 - 5.3 mEq/L   Chloride 93 (*) 96 - 112 mEq/L   CO2 24  19 - 32 mEq/L   Glucose, Bld 148 (*) 70 - 99 mg/dL   BUN 3 (*) 6 - 23 mg/dL   Creatinine, Ser 0.60  0.50 - 1.10 mg/dL   Calcium 9.4  8.4 - 10.5 mg/dL   Total Protein 7.3  6.0 - 8.3 g/dL   Albumin 3.5  3.5 - 5.2 g/dL   AST 271 (*) 0 - 37 U/L   ALT 105 (*) 0 - 35 U/L   Alkaline Phosphatase 275 (*) 39 - 117 U/L   Total Bilirubin 1.4 (*) 0.3 - 1.2 mg/dL   GFR calc non Af Amer >90  >90 mL/min   GFR calc Af Amer  >90  >90 mL/min   Comment: (NOTE)     The eGFR has been calculated using the CKD EPI equation.     This calculation has not been validated in all clinical situations.     eGFR's persistently <90 mL/min signify possible  Chronic Kidney     Disease.  LIPASE, BLOOD     Status: None   Collection Time    12/15/13  4:25 PM      Result Value Ref Range   Lipase 34  11 - 59 U/L  URINALYSIS, ROUTINE W REFLEX MICROSCOPIC     Status: Abnormal   Collection Time    12/15/13  5:16 PM      Result Value Ref Range   Color, Urine YELLOW  YELLOW   APPearance CLEAR  CLEAR   Specific Gravity, Urine 1.003 (*) 1.005 - 1.030   pH 7.5  5.0 - 8.0   Glucose, UA NEGATIVE  NEGATIVE mg/dL   Hgb urine dipstick NEGATIVE  NEGATIVE   Bilirubin Urine NEGATIVE  NEGATIVE   Ketones, ur NEGATIVE  NEGATIVE mg/dL   Protein, ur NEGATIVE  NEGATIVE mg/dL   Urobilinogen, UA 0.2  0.0 - 1.0 mg/dL   Nitrite NEGATIVE  NEGATIVE   Leukocytes, UA NEGATIVE  NEGATIVE   Comment: MICROSCOPIC NOT DONE ON URINES WITH NEGATIVE PROTEIN, BLOOD, LEUKOCYTES, NITRITE, OR GLUCOSE <1000 mg/dL.  CLOSTRIDIUM DIFFICILE BY PCR     Status: Abnormal   Collection Time    12/15/13 11:55 PM      Result Value Ref Range   C difficile by pcr POSITIVE (*) NEGATIVE   Comment: CRITICAL RESULT CALLED TO, READ BACK BY AND VERIFIED WITH:     CWyline Copas RN 10:55 12/15/13 ([email or web address])  COMPREHENSIVE METABOLIC PANEL     Status: Abnormal   Collection Time    12/16/13  5:40 AM      Result Value Ref Range   Sodium 139  137 - 147 mEq/L   Potassium 3.1 (*) 3.7 - 5.3 mEq/L   Chloride 106  96 - 112 mEq/L   CO2 21  19 - 32 mEq/L   Glucose, Bld 115 (*) 70 - 99 mg/dL   BUN 3 (*) 6 - 23 mg/dL   Creatinine, Ser 0.57  0.50 - 1.10 mg/dL   Calcium 7.3 (*) 8.4 - 10.5 mg/dL   Total Protein 5.1 (*) 6.0 - 8.3 g/dL   Albumin 2.2 (*) 3.5 - 5.2 g/dL   AST 135 (*) 0 - 37 U/L   ALT 59 (*) 0 - 35 U/L   Alkaline Phosphatase 177 (*) 39 - 117 U/L   Total Bilirubin 1.0  0.3 - 1.2  mg/dL   GFR calc non Af Amer >90  >90 mL/min   GFR calc Af Amer >90  >90 mL/min   Comment: (NOTE)     The eGFR has been calculated using the CKD EPI equation.     This calculation has not been validated in all clinical situations.     eGFR's persistently <90 mL/min signify possible Chronic Kidney     Disease.  PROTIME-INR     Status: None   Collection Time    12/16/13  5:40 AM      Result Value Ref Range   Prothrombin Time 15.2  11.6 - 15.2 seconds   INR 1.23  0.00 - 1.49  CBC     Status: Abnormal   Collection Time    12/16/13  5:40 AM      Result Value Ref Range   WBC 10.7 (*) 4.0 - 10.5 K/uL   RBC 2.36 (*) 3.87 - 5.11 MIL/uL   Hemoglobin 9.3 (*) 12.0 - 15.0 g/dL   HCT 26.8 (*) 36.0 - 46.0 %   MCV 113.6 (*) 78.0 - 100.0  fL   MCH 39.4 (*) 26.0 - 34.0 pg   MCHC 34.7  30.0 - 36.0 g/dL   RDW 15.7 (*) 11.5 - 15.5 %   Platelets 138 (*) 150 - 400 K/uL    Ct Abdomen Pelvis Wo Contrast  12/15/2013   CLINICAL DATA:  Lower abdominal pain, nausea and vomiting, with diarrhea, and with hematuria.  EXAM: CT ABDOMEN AND PELVIS WITHOUT CONTRAST  TECHNIQUE: Multidetector CT imaging of the abdomen and pelvis was performed following the standard protocol without intravenous contrast.  COMPARISON:  US ABDOMEN COMPLETE dated 12/12/2013; CT ABD/PELVIS W CM dated 11/11/2013; CT ABD/PELVIS W CM dated 07/25/2013  FINDINGS: Marked hepatic steatosis. Slight hepatomegaly. Normal spleen. Mild gallbladder distention without visible stones. No biliary ductal dilatation. Normal-appearing pancreas and adrenal glands. Lung apices clear.  Prominent left-sided renal scarring is noted with slight left renal atrophy; the right kidney is hypertrophied. No hydronephrosis is seen on the right or left. There are no renal or ureteral stones. There is no perinephric stranding to suggest previous stone passage.  The appendix is normal. The colon appears unusually smooth, particularly in the descending and sigmoid segments, compared  with October 2014 with fluid-filled throughout, mild wall thickening, but no obstruction and no pericolonic inflammation. Considerations would include inflammatory bowel disease, autoimmune colitis, pseudomembranous colitis, or infectious colitis. Compared with 11/11/2013, the findings have also progressed.  Physiologic slightly greater than 2 cm left adnexal cyst may be slightly smaller. No free fluid no retroperitoneal, mesenteric, or inguinal adenopathy.  IMPRESSION: Nonspecific fluid-filled colon with mild loss of haustral markings, mild wall thickening, but no obstruction or pericolonic inflammation. Nonspecific findings of colitis are suggested.  Left renal scarring is stable.  No renal obstruction or calculi.  Marked hepatic steatosis with slight hepatomegaly also stable.   Electronically Signed   By: Rolla Flatten M.D.   On: 12/15/2013 17:50               Blood pressure 104/58, pulse 103, temperature 98.6 F (37 C), temperature source Oral, resp. rate 18, height 5' 2"  (1.575 m), weight 52.2 kg (115 lb 1.3 oz), last menstrual period 10/08/2013, SpO2 96.00%.  Physical exam:   General--slightly obese white female in no acute distress Heart--regular rate and rhythm without murmurs are gallops Lungs-- clear Abdomen--mild nonlocalized tenderness bowel sounds present   Assessment: 1. Profuse chronic diarrhea/abdominal pain. The cause of this is unclear but with nocturnal stools and 15 bowel movement today I think we need to consider IBD, autoimmune colitis etc. I doubt that the patient has had chronic C. difficile colitis explaining her symptoms for the past year. Individuals with IBD are much more prone to spontaneous C. difficile colitis. 2. C. difficile colitis 3. NASH with possible early cirrhosis by CT criteria. Patient has had extensive workup.  Plan: 1. Would go ahead and add oral vancomycin to control her C. Difficile. 2. Will plan unprepped sigmoidoscopy tomorrow by Jamie. Amedeo Plenty.  Have discussed this with the patient.    Devynne Sturdivant JR,Snyder Colavito L 12/16/2013, 1:36 PM

## 2013-12-16 NOTE — Progress Notes (Addendum)
PROGRESS NOTE  Jamie Allen STM:[id number] DOB: 12-Jun-1971 DOA: 12/15/2013 PCP: Jamie Chessman, MD  HPI/Subjective: 43 yo female with PMH of GERD, NASH, and chronic abdominal pain presented to ED on 2/26 with nausea, vomiting, diarrhea and cramping abdominal pain. She reports this has been going on intermittently for the past 8 months with occasional hematochezia and heatemesis. She was seen in the ED in January with similar complaints and a CT showed colonic thickening without any inflammation. She is scheduled for a colonoscopy on March 5th. She started Keflex on 2/23 for a UTI.  Patient reports she has had 5 loose watery bowel movements overnight and no vomiting. She remains afebrile. She appears anxious and is currently complaining of nausea and pain.   Assessment/Plan:  Colitis -Patient with diarrhea and abdominal pain - C-diff positive. -CT revealed colon with mild loss of haustral markings, mild wall thickening, and obstruction or pericolonic  inflammation.  -Pt has leukocytosis (WBC 10.6) -Continue Flagyl -Continue IV hydration, antiemetics and PPI -C. Diff and GI pathogen panel pending -Consult Dr. Oletta Lamas of GI today 2/27  Vomiting -? Hepatomegaly contributing? -Patient reports intermittent vomiting with coffee grounds x 8 months -Upper endoscopy was done in 09/2013 at [facility] = Normal. -She reports a 17 lbs in the past month. -Supportive care with Zofran.   No PPI as she is c-diff positive.  NASH -Patient with elevated LFTs -Monitor -Avoid hepatotoxic medications  UTI -Patient started keflex 2/23 -UA on 2/26 much improved from UA on 2/23 -Continue ciprofloxacin  Hx/o Immune Def. Disorder.  Macrocytic Anemia with thrombocytopenia. -Will check B12, Ferritin -Monitor Hgb. -Check GGT   DVT Prophylaxis:  Lovenox  Code Status: Full Code Family Communication: Patient is awake, alert, and oriented. No family members present. Disposition Plan:  Inpatient   Consultants:  Dr. Oletta Lamas of Gastroenterology  Procedures:  CT Scan 2/26   Antibiotics:  Ciprofloxacin, Flagyl  Objective: Filed Vitals:   12/15/13 1854 12/15/13 2122 12/15/13 2240 12/16/13 0430  BP: 112/68 108/75 106/44 104/58  Pulse: 110 105 114 103  Temp:  99.3 F (37.4 C) 98.7 F (37.1 C) 98.6 F (37 C)  TempSrc:  Oral Oral Oral  Resp: 20 20 20 18   Height:   5' 2"  (1.575 m)   Weight:   52.17 kg (115 lb 0.2 oz) 52.2 kg (115 lb 1.3 oz)  SpO2: 96%  97% 96%    Intake/Output Summary (Last 24 hours) at 12/16/13 1051 Last data filed at 12/16/13 0700  Gross per 24 hour  Intake   1085 ml  Output      1 ml  Net   1084 ml   Filed Weights   12/15/13 1620 12/15/13 2240 12/16/13 0430  Weight: 52.164 kg (115 lb) 52.17 kg (115 lb 0.2 oz) 52.2 kg (115 lb 1.3 oz)    Exam: General: Well developed, well nourished, anxious appearing. Non toxic. HEENT:  PERRL, No pharyngeal erythema or exudates, moist oral mucosa. Poor dentition Neck: Supple, no JVD, no masses.  Cardiovascular: RRR, S1 S2 auscultated, no rubs, murmurs or gallops.   Respiratory: Clear to auscultation bilaterally with equal chest rise.  Abdomen: Soft, tender to palpation especially on the right, nondistended, + bowel sounds, with hepatomegaly.  Extremities: Warm dry without cyanosis clubbing or edema.  Neuro: AAOx3, cranial nerves grossly intact. Strength 5/5 in upper and lower extremities  Skin: Without rashes exudates or nodules.   Psych: Patient appears anxious and reports pain inconsistent with findings on physical exam and imaging.  Data Reviewed: Basic Metabolic Panel:  Recent Labs Lab 12/12/13 1625 12/15/13 1625 12/16/13 0540  NA 139 136* 139  K 4.0 3.4* 3.1*  CL 95* 93* 106  CO2 23 24 21   GLUCOSE 150* 148* 115*  BUN 2* 3* 3*  CREATININE 0.60 0.60 0.57  CALCIUM 9.6 9.4 7.3*   Liver Function Tests:  Recent Labs Lab 12/12/13 1625 12/15/13 1625 12/16/13 0540  AST 306* 271*  135*  ALT 133* 105* 59*  ALKPHOS 278* 275* 177*  BILITOT 1.8* 1.4* 1.0  PROT 7.1 7.3 5.1*  ALBUMIN 3.7 3.5 2.2*    Recent Labs Lab 12/12/13 1625 12/15/13 1625  LIPASE 43 34   CBC:  Recent Labs Lab 12/12/13 1625 12/15/13 1625 12/16/13 0540  WBC 6.8 17.5* 10.7*  NEUTROABS 5.2 15.5*  --   HGB 12.4 12.0 9.3*  HCT 34.6* 33.7* 26.8*  MCV 109.5* 111.2* 113.6*  PLT 189 177 138*    Studies: Ct Abdomen Pelvis Wo Contrast  12/15/2013   CLINICAL DATA:  Lower abdominal pain, nausea and vomiting, with diarrhea, and with hematuria.  EXAM: CT ABDOMEN AND PELVIS WITHOUT CONTRAST  TECHNIQUE: Multidetector CT imaging of the abdomen and pelvis was performed following the standard protocol without intravenous contrast.  COMPARISON:  US ABDOMEN COMPLETE dated 12/12/2013; CT ABD/PELVIS W CM dated 11/11/2013; CT ABD/PELVIS W CM dated 07/25/2013  FINDINGS: Marked hepatic steatosis. Slight hepatomegaly. Normal spleen. Mild gallbladder distention without visible stones. No biliary ductal dilatation. Normal-appearing pancreas and adrenal glands. Lung apices clear.  Prominent left-sided renal scarring is noted with slight left renal atrophy; the right kidney is hypertrophied. No hydronephrosis is seen on the right or left. There are no renal or ureteral stones. There is no perinephric stranding to suggest previous stone passage.  The appendix is normal. The colon appears unusually smooth, particularly in the descending and sigmoid segments, compared with October 2014 with fluid-filled throughout, mild wall thickening, but no obstruction and no pericolonic inflammation. Considerations would include inflammatory bowel disease, autoimmune colitis, pseudomembranous colitis, or infectious colitis. Compared with 11/11/2013, the findings have also progressed.  Physiologic slightly greater than 2 cm left adnexal cyst may be slightly smaller. No free fluid no retroperitoneal, mesenteric, or inguinal adenopathy.  IMPRESSION:  Nonspecific fluid-filled colon with mild loss of haustral markings, mild wall thickening, but no obstruction or pericolonic inflammation. Nonspecific findings of colitis are suggested.  Left renal scarring is stable.  No renal obstruction or calculi.  Marked hepatic steatosis with slight hepatomegaly also stable.   Electronically Signed   By: Rolla Flatten M.D.   On: 12/15/2013 17:50    Scheduled Meds: . enoxaparin (LOVENOX) injection  40 mg Subcutaneous Q24H  . metronidazole  500 mg Intravenous Q8H  . nicotine  21 mg Transdermal Daily   Continuous Infusions: . sodium chloride 0.9 % 1,000 mL with potassium chloride 40 mEq infusion      Principal Problem:   Colitis Active Problems:   Nausea with vomiting   NASH (nonalcoholic steatohepatitis)    Ruben Im PA-S  Imogene Burn, PA-C [facility] Pager [phone]. If 7PM-7AM, please contact night-coverage at [email or web address], password [facility] 12/16/2013, 10:51 AM  LOS: 1 day   Attending The patient seen and examined, agree with the above assessment and plan. Admitted with abdominal pain, diarrhea and vomiting. CT of the abdomen suggestive of colitis, C. difficile PCR positive. GI consulted, planning on flexible sigmoidoscopy tomorrow.  Nena Alexander MD

## 2013-12-17 ENCOUNTER — Encounter (HOSPITAL_COMMUNITY)
Admission: EM | Disposition: A | Discharge status: Home / Self Care | Payer: Self-pay | Source: Home / Self Care | Attending: Internal Medicine

## 2013-12-17 ENCOUNTER — Encounter (HOSPITAL_COMMUNITY): Payer: Self-pay | Admitting: *Deleted

## 2013-12-17 DIAGNOSIS — E876 Hypokalemia: Secondary | ICD-10-CM

## 2013-12-17 DIAGNOSIS — K7460 Unspecified cirrhosis of liver: Secondary | ICD-10-CM

## 2013-12-17 HISTORY — PX: FLEXIBLE SIGMOIDOSCOPY: SHX5431

## 2013-12-17 LAB — COMPREHENSIVE METABOLIC PANEL
ALBUMIN: 2.4 g/dL — AB (ref 3.5–5.2)
ALT: 57 U/L — AB (ref 0–35)
AST: 128 U/L — AB (ref 0–37)
Alkaline Phosphatase: 178 U/L — ABNORMAL HIGH (ref 39–117)
BUN: <3 mg/dL — ABNORMAL LOW (ref 6–23)
CALCIUM: 7.8 mg/dL — AB (ref 8.4–10.5)
CO2: 20 mEq/L (ref 19–32)
Chloride: 103 mEq/L (ref 96–112)
Creatinine, Ser: 0.47 mg/dL — ABNORMAL LOW (ref 0.50–1.10)
GFR calc Af Amer: >90 mL/min (ref >90)
GFR calc non Af Amer: >90 mL/min (ref >90)
Glucose, Bld: 83 mg/dL (ref 70–99)
Potassium: 3.2 mEq/L — ABNORMAL LOW (ref 3.7–5.3)
SODIUM: 139 meq/L (ref 137–147)
TOTAL PROTEIN: 5.7 g/dL — AB (ref 6.0–8.3)
Total Bilirubin: 0.8 mg/dL (ref 0.3–1.2)

## 2013-12-17 LAB — CBC WITH DIFFERENTIAL/PLATELET
Basophils Absolute: 0 10*3/uL (ref 0.0–0.1)
Basophils Relative: 0 % (ref 0–1)
EOS ABS: 0.3 10*3/uL (ref 0.0–0.7)
EOS PCT: 5 % (ref 0–5)
HCT: 27.9 % — ABNORMAL LOW (ref 36.0–46.0)
Hemoglobin: 9.7 g/dL — ABNORMAL LOW (ref 12.0–15.0)
Lymphocytes Relative: 15 % (ref 12–46)
Lymphs Abs: 0.9 10*3/uL (ref 0.7–4.0)
MCH: 39 pg — AB (ref 26.0–34.0)
MCHC: 34.8 g/dL (ref 30.0–36.0)
MCV: 112 fL — ABNORMAL HIGH (ref 78.0–100.0)
Monocytes Absolute: 0.5 10*3/uL (ref 0.1–1.0)
Monocytes Relative: 8 % (ref 3–12)
NEUTROS PCT: 72 % (ref 43–77)
Neutro Abs: 4 10*3/uL (ref 1.7–7.7)
PLATELETS: 167 10*3/uL (ref 150–400)
RBC: 2.49 MIL/uL — AB (ref 3.87–5.11)
RDW: 15.6 % — ABNORMAL HIGH (ref 11.5–15.5)
WBC: 5.7 10*3/uL (ref 4.0–10.5)

## 2013-12-17 LAB — FERRITIN: Ferritin: 710 ng/mL — ABNORMAL HIGH (ref 10–291)

## 2013-12-17 SURGERY — SIGMOIDOSCOPY, FLEXIBLE
Anesthesia: Moderate Sedation

## 2013-12-17 MED ORDER — SACCHAROMYCES BOULARDII 250 MG PO CAPS
250.0000 mg | ORAL_CAPSULE | Freq: Two times a day (BID) | ORAL | Status: DC
  Administered 2013-12-17 – 2013-12-23 (×12): 250 mg via ORAL
  Filled 2013-12-17 (×14): qty 1

## 2013-12-17 MED ORDER — FENTANYL CITRATE 0.05 MG/ML IJ SOLN
INTRAMUSCULAR | Status: DC | PRN
  Administered 2013-12-17 (×2): 25 ug via INTRAVENOUS

## 2013-12-17 MED ORDER — MIDAZOLAM HCL 10 MG/2ML IJ SOLN
INTRAMUSCULAR | Status: DC | PRN
  Administered 2013-12-17 (×2): 2 mg via INTRAVENOUS
  Administered 2013-12-17: 1 mg via INTRAVENOUS

## 2013-12-17 MED ORDER — DIPHENHYDRAMINE HCL 50 MG/ML IJ SOLN
INTRAMUSCULAR | Status: AC
  Filled 2013-12-17: qty 1

## 2013-12-17 MED ORDER — MIDAZOLAM HCL 5 MG/ML IJ SOLN
INTRAMUSCULAR | Status: AC
  Filled 2013-12-17: qty 3

## 2013-12-17 MED ORDER — FENTANYL CITRATE 0.05 MG/ML IJ SOLN
INTRAMUSCULAR | Status: AC
  Filled 2013-12-17: qty 4

## 2013-12-17 MED ORDER — POTASSIUM CHLORIDE 10 MEQ/100ML IV SOLN
10.0000 meq | INTRAVENOUS | Status: DC
Start: 2013-12-17 — End: 2013-12-17

## 2013-12-17 MED ORDER — POTASSIUM CHLORIDE CRYS ER 20 MEQ PO TBCR
40.0000 meq | EXTENDED_RELEASE_TABLET | Freq: Once | ORAL | Status: AC
  Administered 2013-12-17: 40 meq via ORAL
  Filled 2013-12-17: qty 2

## 2013-12-17 NOTE — Progress Notes (Signed)
[name] Age: 43 y.o. Sex: female Date of Birth: May 28, 1971 Admit Date: 12/15/2013 Admitting Physician Theodis Blaze, MD [id number], Gabrielle Dare, MD  Subjective: Significantly improved, diarrhea has significantly decreased. No vomiting overnight. Abdominal pain almost resolved.  Assessment/Plan: Principal Problem:   Colitis - C. difficile PCR positive, however has had vomiting and diarrhea for 8 months. GI concerned about possibility of underlying ulcerative colitis, patient to undergo flexible sigmoidoscopy today. - Currently on IV Flagyl and oral vancomycin with significant improvement in vomiting, diarrhea and abdominal pain. - GI pathogen panel negative.  Active Problems:   Nausea with vomiting - Seems to have resolved with treatment of the above, and supportive care. -Upper endoscopy was done in 09/2013 at [facility] = Normal.  NASH  -Patient with elevated LFTs, further workup to be done in the outpatient setting.  Recent history of UTI - Currently symptoms are from colitis, no indication for treatment of UTI. Continue to monitor off antibiotics.  Hypokalemia - Will replete and recheck in a.m. This is likely secondary to diarrhea.  Macrocytic anemia - Vitamin B12 levels within normal limits, check folate -? Secondary to alcohol use  Disposition: Remain inpatient  DVT Prophylaxis: Prophylactic Lovenox   Code Status: Full code  Family Communication None at bedside  Procedures:  Flexible sigmoidoscopy scheduled for 2/28  CONSULTS:  GI  Time spent 40 minutes-which includes 50% of the time with face-to-face with patient/ family and coordinating care related to the above assessment and plan.    MEDICATIONS: Scheduled Meds: . enoxaparin (LOVENOX) injection  40 mg Subcutaneous Q24H  . metronidazole  500 mg Intravenous Q8H  . nicotine  21 mg Transdermal Daily  . vancomycin  125 mg Oral 4 times per day   Continuous  Infusions: . sodium chloride    . sodium chloride 0.9 % 1,000 mL with potassium chloride 40 mEq infusion 75 mL/hr at 12/17/13 0302   PRN Meds:.HYDROmorphone (DILAUDID) injection, ondansetron (ZOFRAN) IV, ondansetron  Antibiotics: Anti-infectives   Start     Dose/Rate Route Frequency Ordered Stop   12/16/13 1500  vancomycin (VANCOCIN) 50 mg/mL oral solution 125 mg     125 mg Oral 4 times per day 12/16/13 1323     12/16/13 1330  vancomycin (VANCOCIN) 50 mg/mL oral solution 125 mg  Status:  Discontinued     125 mg Oral 4 times per day 12/16/13 1326 12/16/13 1332   12/16/13 0600  ciprofloxacin (CIPRO) IVPB 400 mg  Status:  Discontinued     400 mg 200 mL/hr over 60 Minutes Intravenous Every 12 hours 12/16/13 0021 12/16/13 1048   12/16/13 0400  metroNIDAZOLE (FLAGYL) IVPB 500 mg     500 mg 100 mL/hr over 60 Minutes Intravenous Every 8 hours 12/16/13 0021     12/15/13 1815  ciprofloxacin (CIPRO) IVPB 400 mg     400 mg 200 mL/hr over 60 Minutes Intravenous  Once 12/15/13 1806 12/15/13 2025   12/15/13 1815  metroNIDAZOLE (FLAGYL) IVPB 500 mg     500 mg 100 mL/hr over 60 Minutes Intravenous  Once 12/15/13 1806 12/15/13 1930       PHYSICAL EXAM: Vital signs in last 24 hours: Filed Vitals:   12/17/13 1125 12/17/13 1130 12/17/13 1135 12/17/13 1140  BP: 147/93 147/93 143/89 140/86  Pulse: 91 78 77 76  Temp:      TempSrc:      Resp: 16 23 22 22   Height:  Weight:      SpO2: 97% 98% 99% 98%    Weight change: 1.236 kg (2 lb 11.6 oz) Filed Weights   12/16/13 0430 12/16/13 1445 12/17/13 0514  Weight: 52.2 kg (115 lb 1.3 oz) 53.4 kg (117 lb 11.6 oz) 53.434 kg (117 lb 12.8 oz)   Body mass index is 21.54 kg/(m^2).   Gen Exam: Awake and alert with clear speech.   Neck: Supple, No JVD.  Chest: B/L Clear.   CVS: S1 S2 Regular, no murmurs.  Abdomen: soft, BS +, minimal tender in the periumbilical area, non distended.  Extremities: no edema, lower extremities warm to  touch. Neurologic: Non Focal.   Skin: No Rash.   Wounds: N/A.    Intake/Output from previous day:  Intake/Output Summary (Last 24 hours) at 12/17/13 1144 Last data filed at 12/17/13 0849  Gross per 24 hour  Intake  637.5 ml  Output      4 ml  Net  633.5 ml     LAB RESULTS: CBC  Recent Labs Lab 12/12/13 1625 12/15/13 1625 12/16/13 0540 12/17/13 0556  WBC 6.8 17.5* 10.7* 5.7  HGB 12.4 12.0 9.3* 9.7*  HCT 34.6* 33.7* 26.8* 27.9*  PLT 189 177 138* 167  MCV 109.5* 111.2* 113.6* 112.0*  MCH 39.2* 39.6* 39.4* 39.0*  MCHC 35.8 35.6 34.7 34.8  RDW 13.7 14.7 15.7* 15.6*  LYMPHSABS 1.1 0.9  --  0.9  MONOABS 0.4 0.9  --  0.5  EOSABS 0.1 0.2  --  0.3  BASOSABS 0.0 0.0  --  0.0    Chemistries   Recent Labs Lab 12/12/13 1625 12/15/13 1625 12/16/13 0540 12/17/13 0556  NA 139 136* 139 139  K 4.0 3.4* 3.1* 3.2*  CL 95* 93* 106 103  CO2 23 24 21 20   GLUCOSE 150* 148* 115* 83  BUN 2* 3* 3* <3*  CREATININE 0.60 0.60 0.57 0.47*  CALCIUM 9.6 9.4 7.3* 7.8*    CBG: No results found for this basename: GLUCAP,  in the last 168 hours  GFR Estimated Creatinine Clearance: 71.7 ml/min (by C-G formula based on Cr of 0.47).  Coagulation profile  Recent Labs Lab 12/16/13 0540  INR 1.23    Cardiac Enzymes No results found for this basename: CK, CKMB, TROPONINI, MYOGLOBIN,  in the last 168 hours  No components found with this basename: POCBNP,  No results found for this basename: DDIMER,  in the last 72 hours No results found for this basename: HGBA1C,  in the last 72 hours No results found for this basename: CHOL, HDL, LDLCALC, TRIG, CHOLHDL, LDLDIRECT,  in the last 72 hours No results found for this basename: TSH, T4TOTAL, FREET3, T3FREE, THYROIDAB,  in the last 72 hours  Recent Labs  12/16/13 1226  VITAMINB12 1725*    Recent Labs  12/15/13 1625  LIPASE 34    Urine Studies No results found for this basename: UACOL, UAPR, USPG, UPH, UTP, UGL, UKET, UBIL,  UHGB, UNIT, UROB, ULEU, UEPI, UWBC, URBC, UBAC, CAST, CRYS, UCOM, BILUA,  in the last 72 hours  MICROBIOLOGY: Recent Results (from the past 240 hour(s))  CLOSTRIDIUM DIFFICILE BY PCR     Status: Abnormal   Collection Time    12/15/13 11:55 PM      Result Value Ref Range Status   C difficile by pcr POSITIVE (*) NEGATIVE Final   Comment: CRITICAL RESULT CALLED TO, READ BACK BY AND VERIFIED WITH:     Gaetano Net RN 10:55 12/15/13 ([email or web address])  RADIOLOGY STUDIES/RESULTS: Ct Abdomen Pelvis Wo Contrast  12/15/2013   CLINICAL DATA:  Lower abdominal pain, nausea and vomiting, with diarrhea, and with hematuria.  EXAM: CT ABDOMEN AND PELVIS WITHOUT CONTRAST  TECHNIQUE: Multidetector CT imaging of the abdomen and pelvis was performed following the standard protocol without intravenous contrast.  COMPARISON:  US ABDOMEN COMPLETE dated 12/12/2013; CT ABD/PELVIS W CM dated 11/11/2013; CT ABD/PELVIS W CM dated 07/25/2013  FINDINGS: Marked hepatic steatosis. Slight hepatomegaly. Normal spleen. Mild gallbladder distention without visible stones. No biliary ductal dilatation. Normal-appearing pancreas and adrenal glands. Lung apices clear.  Prominent left-sided renal scarring is noted with slight left renal atrophy; the right kidney is hypertrophied. No hydronephrosis is seen on the right or left. There are no renal or ureteral stones. There is no perinephric stranding to suggest previous stone passage.  The appendix is normal. The colon appears unusually smooth, particularly in the descending and sigmoid segments, compared with October 2014 with fluid-filled throughout, mild wall thickening, but no obstruction and no pericolonic inflammation. Considerations would include inflammatory bowel disease, autoimmune colitis, pseudomembranous colitis, or infectious colitis. Compared with 11/11/2013, the findings have also progressed.  Physiologic slightly greater than 2 cm left adnexal cyst may be slightly smaller. No free fluid  no retroperitoneal, mesenteric, or inguinal adenopathy.  IMPRESSION: Nonspecific fluid-filled colon with mild loss of haustral markings, mild wall thickening, but no obstruction or pericolonic inflammation. Nonspecific findings of colitis are suggested.  Left renal scarring is stable.  No renal obstruction or calculi.  Marked hepatic steatosis with slight hepatomegaly also stable.   Electronically Signed   By: Rolla Flatten M.D.   On: 12/15/2013 17:50   US Abdomen Complete  12/12/2013   CLINICAL DATA:  Right upper quadrant pain  EXAM: ULTRASOUND ABDOMEN COMPLETE  COMPARISON:  CT 11/11/2013  FINDINGS: Gallbladder:  No gallstones or wall thickening visualized. No sonographic Murphy sign noted.  Common bile duct:  Diameter: Normal at 2.1 mm  Liver:  Diffusely increased in echogenicity.  No duct dilatation.  IVC:  No abnormality visualized.  Pancreas:  Visualized portion unremarkable.  Spleen:  Size and appearance within normal limits.  Right Kidney:  Length: 11.1 cm. Echogenicity within normal limits. No mass or hydronephrosis visualized.  Left Kidney:  Length: 8.5 cm . Echogenicity within normal limits. No mass or hydronephrosis visualized. Cortical scarring in the upper pole.  Abdominal aorta:  No aneurysm visualized.  Other findings:  No free fluid.  IMPRESSION: 1. Increased liver echogenicity most consistent with hepatic steatosis. 2. Normal gallbladder.   Electronically Signed   By: Suzy Bouchard M.D.   On: 12/12/2013 18:07    Oren Binet, MD  [facility] Pager:[phone]  If 7PM-7AM, please contact night-coverage [email or web address] Password [facility] 12/17/2013, 11:44 AM   LOS: 2 days

## 2013-12-17 NOTE — Progress Notes (Signed)
[facility] Progress Note  Subjective: Continue to complain of diarrhea and abdominal pain  Objective: Vital signs in last 24 hours: Temp:  [97.2 F (36.2 C)-98.6 F (37 C)] 97.7 F (36.5 C) (02/28 1205) Pulse Rate:  [76-96] 87 (02/28 1205) Resp:  [10-28] 16 (02/28 1205) BP: (116-147)/(68-103) 132/99 mmHg (02/28 1205) SpO2:  [94 %-100 %] 100 % (02/28 1205) Weight:  [53.4 kg (117 lb 11.6 oz)-53.434 kg (117 lb 12.8 oz)] 53.434 kg (117 lb 12.8 oz) (02/28 0514) Weight change: 1.236 kg (2 lb 11.6 oz)   PE: Unchanged  Lab Results: Results for orders placed during the hospital encounter of 12/15/13 (from the past 24 hour(s))  VITAMIN B12     Status: Abnormal   Collection Time    12/16/13 12:26 PM      Result Value Ref Range   Vitamin B-12 1725 (*) 211 - 911 pg/mL  GAMMA GT     Status: Abnormal   Collection Time    12/16/13 12:26 PM      Result Value Ref Range   GGT 1395 (*) 7 - 51 U/L  CBC WITH DIFFERENTIAL     Status: Abnormal   Collection Time    12/17/13  5:56 AM      Result Value Ref Range   WBC 5.7  4.0 - 10.5 K/uL   RBC 2.49 (*) 3.87 - 5.11 MIL/uL   Hemoglobin 9.7 (*) 12.0 - 15.0 g/dL   HCT 27.9 (*) 36.0 - 46.0 %   MCV 112.0 (*) 78.0 - 100.0 fL   MCH 39.0 (*) 26.0 - 34.0 pg   MCHC 34.8  30.0 - 36.0 g/dL   RDW 15.6 (*) 11.5 - 15.5 %   Platelets 167  150 - 400 K/uL   Neutrophils Relative % 72  43 - 77 %   Lymphocytes Relative 15  12 - 46 %   Monocytes Relative 8  3 - 12 %   Eosinophils Relative 5  0 - 5 %   Basophils Relative 0  0 - 1 %   Neutro Abs 4.0  1.7 - 7.7 K/uL   Lymphs Abs 0.9  0.7 - 4.0 K/uL   Monocytes Absolute 0.5  0.1 - 1.0 K/uL   Eosinophils Absolute 0.3  0.0 - 0.7 K/uL   Basophils Absolute 0.0  0.0 - 0.1 K/uL   RBC Morphology POLYCHROMASIA PRESENT    COMPREHENSIVE METABOLIC PANEL     Status: Abnormal   Collection Time    12/17/13  5:56 AM      Result Value Ref Range   Sodium 139  137 - 147 mEq/L   Potassium 3.2 (*) 3.7 - 5.3 mEq/L    Chloride 103  96 - 112 mEq/L   CO2 20  19 - 32 mEq/L   Glucose, Bld 83  70 - 99 mg/dL   BUN <3 (*) 6 - 23 mg/dL   Creatinine, Ser 0.47 (*) 0.50 - 1.10 mg/dL   Calcium 7.8 (*) 8.4 - 10.5 mg/dL   Total Protein 5.7 (*) 6.0 - 8.3 g/dL   Albumin 2.4 (*) 3.5 - 5.2 g/dL   AST 128 (*) 0 - 37 U/L   ALT 57 (*) 0 - 35 U/L   Alkaline Phosphatase 178 (*) 39 - 117 U/L   Total Bilirubin 0.8  0.3 - 1.2 mg/dL   GFR calc non Af Amer >90  >90 mL/min   GFR calc Af Amer >90  >90 mL/min    Studies/Results: Ct Abdomen  Pelvis Wo Contrast  12/15/2013   CLINICAL DATA:  Lower abdominal pain, nausea and vomiting, with diarrhea, and with hematuria.  EXAM: CT ABDOMEN AND PELVIS WITHOUT CONTRAST  TECHNIQUE: Multidetector CT imaging of the abdomen and pelvis was performed following the standard protocol without intravenous contrast.  COMPARISON:  US ABDOMEN COMPLETE dated 12/12/2013; CT ABD/PELVIS W CM dated 11/11/2013; CT ABD/PELVIS W CM dated 07/25/2013  FINDINGS: Marked hepatic steatosis. Slight hepatomegaly. Normal spleen. Mild gallbladder distention without visible stones. No biliary ductal dilatation. Normal-appearing pancreas and adrenal glands. Lung apices clear.  Prominent left-sided renal scarring is noted with slight left renal atrophy; the right kidney is hypertrophied. No hydronephrosis is seen on the right or left. There are no renal or ureteral stones. There is no perinephric stranding to suggest previous stone passage.  The appendix is normal. The colon appears unusually smooth, particularly in the descending and sigmoid segments, compared with October 2014 with fluid-filled throughout, mild wall thickening, but no obstruction and no pericolonic inflammation. Considerations would include inflammatory bowel disease, autoimmune colitis, pseudomembranous colitis, or infectious colitis. Compared with 11/11/2013, the findings have also progressed.  Physiologic slightly greater than 2 cm left adnexal cyst may be  slightly smaller. No free fluid no retroperitoneal, mesenteric, or inguinal adenopathy.  IMPRESSION: Nonspecific fluid-filled colon with mild loss of haustral markings, mild wall thickening, but no obstruction or pericolonic inflammation. Nonspecific findings of colitis are suggested.  Left renal scarring is stable.  No renal obstruction or calculi.  Marked hepatic steatosis with slight hepatomegaly also stable.   Electronically Signed   By: Rolla Flatten M.D.   On: 12/15/2013 17:50   Flexible sigmoidoscopy: Completely normal to the mid descending colon, no inflammatory changes, biopsies taken to rule out microscopic colitis  Assessment: 1. C. difficile diarrhea 2. Nonalcoholic steatohepatitis 3. No evidence of inflammatory bowel disease to the descending colon, biopsies pending  Plan: 1. Await biopsy results 2. Continue metronidazole and vancomycin and will add florastar 3. We'll begin clear liquid diet and advance as tolerated 4. May need colonoscopy at some point which is scheduled at [facility] next month    Cristin Szatkowski C 12/17/2013, 12:11 PM

## 2013-12-17 NOTE — Op Note (Signed)
[facility] [place], [id number]   FLEXIBLE SIGMOIDOSCOPY PROCEDURE REPORT  PATIENT: Jamie, Allen  MR#: [id number] BIRTHDATE: 1970-12-29 , 42  yrs. old GENDER: Female ENDOSCOPIST: Teena Irani, MD REFERRED BY: PROCEDURE DATE:  12/17/2013 PROCEDURE: ASA CLASS: INDICATIONS: diarrhea and abdominal pain MEDICATIONS:  fentanyl 50 mcg, Versed 4 mg  DESCRIPTION OF PROCEDURE: procedure was done without prep. Pentax video colonoscope inserted into the rectum and advanced to approximately 50 cm where there was some angulation and stool above. The mucosa looked normal throughout without any pseudomembranes edema granularity ulcerations or erosions. The appearance was consistent throughout the examined areas. Biopsies were taken of the most proximal areas reached to rule out microscopic colitis. Retroflex view the anus was unremarkable.       COMPLICATIONS:  None  ENDOSCOPIC IMPRESSION: normal sigmoidoscopy to the mid descending colon, no visible evidence of pseudomembranes or inflammatory bowel disease  RECOMMENDATIONS:  continue treatment for C. difficile diarrhea and await biopsy results, based on thickening on CT scan, may need full colonoscopy at some point.   _______________________________ eSigned:  Teena Irani, MD 12/17/2013 12:08 PM

## 2013-12-18 DIAGNOSIS — R197 Diarrhea, unspecified: Secondary | ICD-10-CM

## 2013-12-18 LAB — BASIC METABOLIC PANEL
BUN: <3 mg/dL — ABNORMAL LOW (ref 6–23)
CALCIUM: 7.8 mg/dL — AB (ref 8.4–10.5)
CO2: 21 meq/L (ref 19–32)
CREATININE: 0.5 mg/dL (ref 0.50–1.10)
Chloride: 106 mEq/L (ref 96–112)
GFR calc Af Amer: >90 mL/min (ref >90)
GFR calc non Af Amer: >90 mL/min (ref >90)
Glucose, Bld: 91 mg/dL (ref 70–99)
Potassium: 3.4 mEq/L — ABNORMAL LOW (ref 3.7–5.3)
Sodium: 140 mEq/L (ref 137–147)

## 2013-12-18 LAB — IRON AND TIBC
Iron: 36 ug/dL — ABNORMAL LOW (ref 42–135)
Saturation Ratios: 23 % (ref 20–55)
TIBC: 160 ug/dL — ABNORMAL LOW (ref 250–470)
UIBC: 124 ug/dL — ABNORMAL LOW (ref 125–400)

## 2013-12-18 MED ORDER — OXYCODONE HCL 5 MG PO TABS
5.0000 mg | ORAL_TABLET | ORAL | Status: DC | PRN
  Administered 2013-12-18 – 2013-12-23 (×8): 10 mg via ORAL
  Filled 2013-12-18 (×6): qty 2
  Filled 2013-12-18: qty 1
  Filled 2013-12-18 (×2): qty 2

## 2013-12-18 MED ORDER — POTASSIUM CHLORIDE CRYS ER 20 MEQ PO TBCR
40.0000 meq | EXTENDED_RELEASE_TABLET | Freq: Every day | ORAL | Status: DC
  Administered 2013-12-19 – 2013-12-21 (×2): 40 meq via ORAL
  Filled 2013-12-18 (×4): qty 2

## 2013-12-18 MED ORDER — PROMETHAZINE HCL 25 MG/ML IJ SOLN
25.0000 mg | Freq: Four times a day (QID) | INTRAMUSCULAR | Status: DC | PRN
  Administered 2013-12-18 – 2013-12-22 (×4): 25 mg via INTRAVENOUS
  Filled 2013-12-18 (×5): qty 1

## 2013-12-18 MED ORDER — HYDROMORPHONE HCL PF 1 MG/ML IJ SOLN
0.5000 mg | Freq: Four times a day (QID) | INTRAMUSCULAR | Status: DC | PRN
  Administered 2013-12-18 – 2013-12-22 (×13): 0.5 mg via INTRAVENOUS
  Filled 2013-12-18 (×13): qty 1

## 2013-12-18 NOTE — Progress Notes (Signed)
[name] Age: 43 y.o. Sex: female Date of Birth: 12/29/1970 Admit Date: 12/15/2013 Admitting Physician Theodis Blaze, MD [id number], Gabrielle Dare, MD  Subjective: Overall better-but still has diarrhea. Apparently vomited after taking oral Vanco  Assessment/Plan: Principal Problem:   C Diff Colitis - C. difficile PCR positive, however has had vomiting and diarrhea for 8 months. GI concerned about possibility of underlying ulcerative colitis, patient underwent flexible sigmoidoscopy on 2/28-no abnormalities seen, but bx results are pending - Currently on IV Flagyl and oral vancomycin-started on 12/16/13. Continue with Florastor - GI pathogen panel negative.  Active Problems:   Nausea with vomiting - Seems to have resolved with treatment of the above, and supportive care. -Upper endoscopy was done in 09/2013 at [facility] = Normal.  Chronic Diarrhea -claims to have diarrhea since last October -EGD on 09/2013 at [facility] -Prior work up-HIV/Hep Serology/Celiac panel-negative -GI planning colonoscopy at some point  [facility]  -Patient with elevated LFTs, further workup to be done in the outpatient setting. -prior work up done in the outpatient setting-showes negative alpha one anti-trypsin, ceruloplasmin,neg hep serology, however Anti smooth muscle antibody was positive.  Recent history of UTI - Currently symptoms are from colitis, no indication for treatment of UTI. Continue to monitor off antibiotics.  Hypokalemia - Will replete and recheck in a.m. This is likely secondary to diarrhea.  Macrocytic anemia - Vitamin B12 levels within normal limits,  Folate pending -? Secondary to alcohol use  Disposition: Remain inpatient  DVT Prophylaxis: Prophylactic Lovenox   Code Status: Full code  Family Communication None at bedside  Procedures:  Flexible sigmoidoscopy 2/28  CONSULTS:  GI  Time spent 40 minutes-which includes 50% of the time  with face-to-face with patient/ family and coordinating care related to the above assessment and plan.  MEDICATIONS: Scheduled Meds: . enoxaparin (LOVENOX) injection  40 mg Subcutaneous Q24H  . metronidazole  500 mg Intravenous Q8H  . nicotine  21 mg Transdermal Daily  . saccharomyces boulardii  250 mg Oral BID  . vancomycin  125 mg Oral 4 times per day   Continuous Infusions: . sodium chloride 0.9 % 1,000 mL with potassium chloride 40 mEq infusion 75 mL/hr at 12/17/13 2028   PRN Meds:.HYDROmorphone (DILAUDID) injection, ondansetron (ZOFRAN) IV, ondansetron  Antibiotics: Anti-infectives   Start     Dose/Rate Route Frequency Ordered Stop   12/16/13 1500  vancomycin (VANCOCIN) 50 mg/mL oral solution 125 mg     125 mg Oral 4 times per day 12/16/13 1323     12/16/13 1330  vancomycin (VANCOCIN) 50 mg/mL oral solution 125 mg  Status:  Discontinued     125 mg Oral 4 times per day 12/16/13 1326 12/16/13 1332   12/16/13 0600  ciprofloxacin (CIPRO) IVPB 400 mg  Status:  Discontinued     400 mg 200 mL/hr over 60 Minutes Intravenous Every 12 hours 12/16/13 0021 12/16/13 1048   12/16/13 0400  metroNIDAZOLE (FLAGYL) IVPB 500 mg     500 mg 100 mL/hr over 60 Minutes Intravenous Every 8 hours 12/16/13 0021     12/15/13 1815  ciprofloxacin (CIPRO) IVPB 400 mg     400 mg 200 mL/hr over 60 Minutes Intravenous  Once 12/15/13 1806 12/15/13 2025   12/15/13 1815  metroNIDAZOLE (FLAGYL) IVPB 500 mg     500 mg 100 mL/hr over 60 Minutes Intravenous  Once 12/15/13 1806 12/15/13 1930       PHYSICAL EXAM: Vital signs  in last 24 hours: Filed Vitals:   12/17/13 1359 12/17/13 2109 12/17/13 2112 12/18/13 0547  BP: 131/85 139/85 120/61 136/90  Pulse: 106 89 68 90  Temp: 97.8 F (36.6 C) 97.9 F (36.6 C) 98.3 F (36.8 C) 98 F (36.7 C)  TempSrc: Oral Oral Oral Oral  Resp: 20 18 20 18   Height:      Weight:    53.207 kg (117 lb 4.8 oz)  SpO2: 99% 99% 94% 98%    Weight change: -0.193 kg (-6.8  oz) Filed Weights   12/16/13 1445 12/17/13 0514 12/18/13 0547  Weight: 53.4 kg (117 lb 11.6 oz) 53.434 kg (117 lb 12.8 oz) 53.207 kg (117 lb 4.8 oz)   Body mass index is 21.45 kg/(m^2).   Gen Exam: Awake and alert with clear speech.   Neck: Supple, No JVD.  Chest: B/L Clear.  No rhonchi CVS: S1 S2 Regular, no murmurs.  Abdomen: soft, BS +, minimal tender in the periumbilical area, non distended.  Extremities: no edema, lower extremities warm to touch. Neurologic: Non Focal.   Skin: No Rash.   Wounds: N/A.    Intake/Output from previous day:  Intake/Output Summary (Last 24 hours) at 12/18/13 1025 Last data filed at 12/18/13 1013  Gross per 24 hour  Intake   1500 ml  Output      1 ml  Net   1499 ml     LAB RESULTS: CBC  Recent Labs Lab 12/12/13 1625 12/15/13 1625 12/16/13 0540 12/17/13 0556  WBC 6.8 17.5* 10.7* 5.7  HGB 12.4 12.0 9.3* 9.7*  HCT 34.6* 33.7* 26.8* 27.9*  PLT 189 177 138* 167  MCV 109.5* 111.2* 113.6* 112.0*  MCH 39.2* 39.6* 39.4* 39.0*  MCHC 35.8 35.6 34.7 34.8  RDW 13.7 14.7 15.7* 15.6*  LYMPHSABS 1.1 0.9  --  0.9  MONOABS 0.4 0.9  --  0.5  EOSABS 0.1 0.2  --  0.3  BASOSABS 0.0 0.0  --  0.0    Chemistries   Recent Labs Lab 12/12/13 1625 12/15/13 1625 12/16/13 0540 12/17/13 0556 12/18/13 0625  NA 139 136* 139 139 140  K 4.0 3.4* 3.1* 3.2* 3.4*  CL 95* 93* 106 103 106  CO2 23 24 21 20 21   GLUCOSE 150* 148* 115* 83 91  BUN 2* 3* 3* <3* <3*  CREATININE 0.60 0.60 0.57 0.47* 0.50  CALCIUM 9.6 9.4 7.3* 7.8* 7.8*    CBG: No results found for this basename: GLUCAP,  in the last 168 hours  GFR Estimated Creatinine Clearance: 71.7 ml/min (by C-G formula based on Cr of 0.5).  Coagulation profile  Recent Labs Lab 12/16/13 0540  INR 1.23    Cardiac Enzymes No results found for this basename: CK, CKMB, TROPONINI, MYOGLOBIN,  in the last 168 hours  No components found with this basename: POCBNP,  No results found for this basename:  DDIMER,  in the last 72 hours No results found for this basename: HGBA1C,  in the last 72 hours No results found for this basename: CHOL, HDL, LDLCALC, TRIG, CHOLHDL, LDLDIRECT,  in the last 72 hours No results found for this basename: TSH, T4TOTAL, FREET3, T3FREE, THYROIDAB,  in the last 72 hours  Recent Labs  12/16/13 1226 12/17/13 1220  VITAMINB12 1725*  --   FERRITIN  --  710*    Recent Labs  12/15/13 1625  LIPASE 34    Urine Studies No results found for this basename: UACOL, UAPR, USPG, UPH, UTP, UGL, UKET, UBIL,  UHGB, UNIT, UROB, ULEU, UEPI, UWBC, URBC, UBAC, CAST, CRYS, UCOM, BILUA,  in the last 72 hours  MICROBIOLOGY: Recent Results (from the past 240 hour(s))  CLOSTRIDIUM DIFFICILE BY PCR     Status: Abnormal   Collection Time    12/15/13 11:55 PM      Result Value Ref Range Status   C difficile by pcr POSITIVE (*) NEGATIVE Final   Comment: CRITICAL RESULT CALLED TO, READ BACK BY AND VERIFIED WITH:     CWyline Copas RN 10:55 12/15/13 ([email or web address])  STOOL CULTURE     Status: None   Collection Time    12/16/13  2:20 AM      Result Value Ref Range Status   Specimen Description STOOL   Final   Special Requests NONE   Final   Culture     Final   Value: Culture reincubated for better growth     Performed at [facility]   Report Status PENDING   Incomplete    RADIOLOGY STUDIES/RESULTS: Ct Abdomen Pelvis Wo Contrast  12/15/2013   CLINICAL DATA:  Lower abdominal pain, nausea and vomiting, with diarrhea, and with hematuria.  EXAM: CT ABDOMEN AND PELVIS WITHOUT CONTRAST  TECHNIQUE: Multidetector CT imaging of the abdomen and pelvis was performed following the standard protocol without intravenous contrast.  COMPARISON:  US ABDOMEN COMPLETE dated 12/12/2013; CT ABD/PELVIS W CM dated 11/11/2013; CT ABD/PELVIS W CM dated 07/25/2013  FINDINGS: Marked hepatic steatosis. Slight hepatomegaly. Normal spleen. Mild gallbladder distention without visible stones. No biliary ductal  dilatation. Normal-appearing pancreas and adrenal glands. Lung apices clear.  Prominent left-sided renal scarring is noted with slight left renal atrophy; the right kidney is hypertrophied. No hydronephrosis is seen on the right or left. There are no renal or ureteral stones. There is no perinephric stranding to suggest previous stone passage.  The appendix is normal. The colon appears unusually smooth, particularly in the descending and sigmoid segments, compared with October 2014 with fluid-filled throughout, mild wall thickening, but no obstruction and no pericolonic inflammation. Considerations would include inflammatory bowel disease, autoimmune colitis, pseudomembranous colitis, or infectious colitis. Compared with 11/11/2013, the findings have also progressed.  Physiologic slightly greater than 2 cm left adnexal cyst may be slightly smaller. No free fluid no retroperitoneal, mesenteric, or inguinal adenopathy.  IMPRESSION: Nonspecific fluid-filled colon with mild loss of haustral markings, mild wall thickening, but no obstruction or pericolonic inflammation. Nonspecific findings of colitis are suggested.  Left renal scarring is stable.  No renal obstruction or calculi.  Marked hepatic steatosis with slight hepatomegaly also stable.   Electronically Signed   By: Rolla Flatten M.D.   On: 12/15/2013 17:50   US Abdomen Complete  12/12/2013   CLINICAL DATA:  Right upper quadrant pain  EXAM: ULTRASOUND ABDOMEN COMPLETE  COMPARISON:  CT 11/11/2013  FINDINGS: Gallbladder:  No gallstones or wall thickening visualized. No sonographic Murphy sign noted.  Common bile duct:  Diameter: Normal at 2.1 mm  Liver:  Diffusely increased in echogenicity.  No duct dilatation.  IVC:  No abnormality visualized.  Pancreas:  Visualized portion unremarkable.  Spleen:  Size and appearance within normal limits.  Right Kidney:  Length: 11.1 cm. Echogenicity within normal limits. No mass or hydronephrosis visualized.  Left Kidney:   Length: 8.5 cm . Echogenicity within normal limits. No mass or hydronephrosis visualized. Cortical scarring in the upper pole.  Abdominal aorta:  No aneurysm visualized.  Other findings:  No free fluid.  IMPRESSION: 1. Increased liver echogenicity  most consistent with hepatic steatosis. 2. Normal gallbladder.   Electronically Signed   By: Suzy Bouchard M.D.   On: 12/12/2013 18:07    Oren Binet, MD  [facility] Pager:[phone]  If 7PM-7AM, please contact night-coverage [email or web address] Password [email or web address] 12/18/2013, 10:25 AM   LOS: 3 days

## 2013-12-18 NOTE — Progress Notes (Addendum)
[facility] Progress Note  Subjective: Still complaining of diarrhea as well as a lot of abdominal bloating fullness early satiety and occasional vomiting  Objective: Vital signs in last 24 hours: Temp:  [97.7 F (36.5 C)-98.6 F (37 C)] 98 F (36.7 C) (03/01 0547) Pulse Rate:  [68-106] 90 (03/01 0547) Resp:  [10-28] 18 (03/01 0547) BP: (120-147)/(61-103) 136/90 mmHg (03/01 0547) SpO2:  [94 %-100 %] 98 % (03/01 0547) Weight:  [53.207 kg (117 lb 4.8 oz)] 53.207 kg (117 lb 4.8 oz) (03/01 0547) Weight change: -0.193 kg (-6.8 oz)   PE: Abdomen diffusely and symmetrically distended mild diffuse tenderness  Lab Results: Results for orders placed during the hospital encounter of 12/15/13 (from the past 24 hour(s))  FERRITIN     Status: Abnormal   Collection Time    12/17/13 12:20 PM      Result Value Ref Range   Ferritin 710 (*) 10 - 291 ng/mL  BASIC METABOLIC PANEL     Status: Abnormal   Collection Time    12/18/13  6:25 AM      Result Value Ref Range   Sodium 140  137 - 147 mEq/L   Potassium 3.4 (*) 3.7 - 5.3 mEq/L   Chloride 106  96 - 112 mEq/L   CO2 21  19 - 32 mEq/L   Glucose, Bld 91  70 - 99 mg/dL   BUN <3 (*) 6 - 23 mg/dL   Creatinine, Ser 0.50  0.50 - 1.10 mg/dL   Calcium 7.8 (*) 8.4 - 10.5 mg/dL   GFR calc non Af Amer >90  >90 mL/min   GFR calc Af Amer >90  >90 mL/min    Studies/Results: No results found.    Assessment: 1. C. difficile diarrhea 2. Chronic diarrhea fairly extensively worked up prior to her acute infection. Flexible sigmoidoscopy yesterday negative, await biopsy results for microscopic colitis 3. Bloating and early satiety 4. 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 Await biopsy results from sigmoidoscopy 2. Continue metronidazole, florastor 3. We'll obtain nuclear medicine gastric imaging scan due to her early satiety and upper tract symptoms with negative EGD in the past    Anysha Frappier C 12/18/2013, 10:35 AM

## 2013-12-19 ENCOUNTER — Encounter (HOSPITAL_COMMUNITY): Payer: Self-pay | Admitting: Gastroenterology

## 2013-12-19 LAB — BASIC METABOLIC PANEL
BUN: <3 mg/dL — ABNORMAL LOW (ref 6–23)
CALCIUM: 8 mg/dL — AB (ref 8.4–10.5)
CHLORIDE: 104 meq/L (ref 96–112)
CO2: 20 meq/L (ref 19–32)
Creatinine, Ser: 0.49 mg/dL — ABNORMAL LOW (ref 0.50–1.10)
GFR calc Af Amer: >90 mL/min (ref >90)
GFR calc non Af Amer: >90 mL/min (ref >90)
GLUCOSE: 84 mg/dL (ref 70–99)
Potassium: 3.7 mEq/L (ref 3.7–5.3)
SODIUM: 138 meq/L (ref 137–147)

## 2013-12-19 LAB — MAGNESIUM: Magnesium: 1.4 mg/dL — ABNORMAL LOW (ref 1.5–2.5)

## 2013-12-19 LAB — FOLATE RBC: RBC Folate: 378 ng/mL (ref >280)

## 2013-12-19 MED ORDER — MAGNESIUM SULFATE 40 MG/ML IJ SOLN
2.0000 g | Freq: Once | INTRAMUSCULAR | Status: AC
  Administered 2013-12-19: 2 g via INTRAVENOUS
  Filled 2013-12-19: qty 50

## 2013-12-19 MED ORDER — DIPHENHYDRAMINE HCL 50 MG/ML IJ SOLN
12.5000 mg | INTRAMUSCULAR | Status: DC | PRN
  Administered 2013-12-19: 12.5 mg via INTRAVENOUS
  Filled 2013-12-19: qty 1

## 2013-12-19 NOTE — Progress Notes (Signed)
[name] Age: 43 y.o. Sex: female Date of Birth: Nov 11, 1970 Admit Date: 12/15/2013 Admitting Physician Theodis Blaze, MD [id number], Gabrielle Dare, MD  Subjective: Diarrhea and vomiting continues.  Assessment/Plan: Principal Problem:   C Diff Colitis - C. difficile PCR positive, however has had vomiting and diarrhea for 8 months. GI concerned about possibility of underlying ulcerative colitis, patient underwent flexible sigmoidoscopy on 2/28-no abnormalities seen, but bx results are pending - Currently on IV Flagyl and oral vancomycin-started on 12/16/13. Continue with Florastor - GI pathogen panel negative.  Active Problems:    Nausea with vomiting -continues to have vomiting-may have slowed down a bit yesterday -Upper endoscopy was done in 09/2013 at [facility] = Normal. - Await gastric emptying study  Chronic Diarrhea -claims to have diarrhea since last October, some contribution to her diarrhea by C. difficile colitis. Unfortunately he continues to have diarrhea even with IV Flagyl/oral vancomycin since 2/27. -EGD on 09/2013 at [facility]. Sigmoidoscopy on 2/28 essentially negative, biopsy pending. -Prior work up-HIV/Hep Serology/Celiac panel-negative -GI planning colonoscopy at some point  [facility]  -Patient with elevated LFTs, further workup to be done in the outpatient setting. -prior work up done in the outpatient setting-showes negative alpha one anti-trypsin, ceruloplasmin,neg hep serology, however Anti smooth muscle antibody was positive.  Recent history of UTI - Currently symptoms are from colitis, no indication for treatment of UTI. Continue to monitor off antibiotics.  Hypokalemia - This is likely secondary to diarrhea. -Stable  Macrocytic anemia - Vitamin B12 levels within normal limits,  Folate pending -? Secondary to alcohol use  Disposition: Remain inpatient  DVT Prophylaxis: Prophylactic Lovenox   Code Status: Full  code  Family Communication None at bedside  Procedures:  Flexible sigmoidoscopy 2/28  CONSULTS:  GI  MEDICATIONS: Scheduled Meds: . enoxaparin (LOVENOX) injection  40 mg Subcutaneous Q24H  . metronidazole  500 mg Intravenous Q8H  . nicotine  21 mg Transdermal Daily  . potassium chloride  40 mEq Oral Daily  . saccharomyces boulardii  250 mg Oral BID  . vancomycin  125 mg Oral 4 times per day   Continuous Infusions: . sodium chloride 0.9 % 1,000 mL with potassium chloride 40 mEq infusion 75 mL/hr at 12/18/13 1211   PRN Meds:.HYDROmorphone (DILAUDID) injection, ondansetron (ZOFRAN) IV, ondansetron, oxyCODONE, promethazine  Antibiotics: Anti-infectives   Start     Dose/Rate Route Frequency Ordered Stop   12/16/13 1500  vancomycin (VANCOCIN) 50 mg/mL oral solution 125 mg     125 mg Oral 4 times per day 12/16/13 1323     12/16/13 1330  vancomycin (VANCOCIN) 50 mg/mL oral solution 125 mg  Status:  Discontinued     125 mg Oral 4 times per day 12/16/13 1326 12/16/13 1332   12/16/13 0600  ciprofloxacin (CIPRO) IVPB 400 mg  Status:  Discontinued     400 mg 200 mL/hr over 60 Minutes Intravenous Every 12 hours 12/16/13 0021 12/16/13 1048   12/16/13 0400  metroNIDAZOLE (FLAGYL) IVPB 500 mg     500 mg 100 mL/hr over 60 Minutes Intravenous Every 8 hours 12/16/13 0021     12/15/13 1815  ciprofloxacin (CIPRO) IVPB 400 mg     400 mg 200 mL/hr over 60 Minutes Intravenous  Once 12/15/13 1806 12/15/13 2025   12/15/13 1815  metroNIDAZOLE (FLAGYL) IVPB 500 mg     500 mg 100 mL/hr over 60 Minutes Intravenous  Once 12/15/13 1806 12/15/13 1930  PHYSICAL EXAM: Vital signs in last 24 hours: Filed Vitals:   12/18/13 0547 12/18/13 1423 12/18/13 2034 12/19/13 0448  BP: 136/90 135/88 131/87 121/81  Pulse: 90 78 80 96  Temp: 98 F (36.7 C) 98.9 F (37.2 C) 99 F (37.2 C) 98.3 F (36.8 C)  TempSrc: Oral Oral Oral Oral  Resp: 18 18 18 18   Height:      Weight: 53.207 kg (117 lb 4.8 oz)      SpO2: 98% 99% 97% 99%    Weight change:  Filed Weights   12/16/13 1445 12/17/13 0514 12/18/13 0547  Weight: 53.4 kg (117 lb 11.6 oz) 53.434 kg (117 lb 12.8 oz) 53.207 kg (117 lb 4.8 oz)   Body mass index is 21.45 kg/(m^2).   Gen Exam: Awake and alert with clear speech.   Neck: Supple, No JVD.  Chest: B/L Clear.  No rales CVS: S1 S2 Regular, no murmurs.  Abdomen: soft, BS +, minimal tender in the periumbilical area, non distended.  Extremities: no edema, lower extremities warm to touch. Neurologic: Non Focal.   Skin: No Rash.   Wounds: N/A.    Intake/Output from previous day:  Intake/Output Summary (Last 24 hours) at 12/19/13 1023 Last data filed at 12/19/13 0700  Gross per 24 hour  Intake   1525 ml  Output      1 ml  Net   1524 ml     LAB RESULTS: CBC  Recent Labs Lab 12/12/13 1625 12/15/13 1625 12/16/13 0540 12/17/13 0556  WBC 6.8 17.5* 10.7* 5.7  HGB 12.4 12.0 9.3* 9.7*  HCT 34.6* 33.7* 26.8* 27.9*  PLT 189 177 138* 167  MCV 109.5* 111.2* 113.6* 112.0*  MCH 39.2* 39.6* 39.4* 39.0*  MCHC 35.8 35.6 34.7 34.8  RDW 13.7 14.7 15.7* 15.6*  LYMPHSABS 1.1 0.9  --  0.9  MONOABS 0.4 0.9  --  0.5  EOSABS 0.1 0.2  --  0.3  BASOSABS 0.0 0.0  --  0.0    Chemistries   Recent Labs Lab 12/15/13 1625 12/16/13 0540 12/17/13 0556 12/18/13 0625 12/19/13 0550  NA 136* 139 139 140 138  K 3.4* 3.1* 3.2* 3.4* 3.7  CL 93* 106 103 106 104  CO2 24 21 20 21 20   GLUCOSE 148* 115* 83 91 84  BUN 3* 3* <3* <3* <3*  CREATININE 0.60 0.57 0.47* 0.50 0.49*  CALCIUM 9.4 7.3* 7.8* 7.8* 8.0*  MG  --   --   --   --  1.4*    CBG: No results found for this basename: GLUCAP,  in the last 168 hours  GFR Estimated Creatinine Clearance: 71.7 ml/min (by C-G formula based on Cr of 0.49).  Coagulation profile  Recent Labs Lab 12/16/13 0540  INR 1.23    Cardiac Enzymes No results found for this basename: CK, CKMB, TROPONINI, MYOGLOBIN,  in the last 168 hours  No  components found with this basename: POCBNP,  No results found for this basename: DDIMER,  in the last 72 hours No results found for this basename: HGBA1C,  in the last 72 hours No results found for this basename: CHOL, HDL, LDLCALC, TRIG, CHOLHDL, LDLDIRECT,  in the last 72 hours No results found for this basename: TSH, T4TOTAL, FREET3, T3FREE, THYROIDAB,  in the last 72 hours  Recent Labs  12/16/13 1226 12/17/13 1220 12/18/13 1121  VITAMINB12 1725*  --   --   FERRITIN  --  710*  --   TIBC  --   --  160*  IRON  --   --  36*   No results found for this basename: LIPASE, AMYLASE,  in the last 72 hours  Urine Studies No results found for this basename: UACOL, UAPR, USPG, UPH, UTP, UGL, UKET, UBIL, UHGB, UNIT, UROB, ULEU, UEPI, UWBC, URBC, UBAC, CAST, CRYS, UCOM, BILUA,  in the last 72 hours  MICROBIOLOGY: Recent Results (from the past 240 hour(s))  CLOSTRIDIUM DIFFICILE BY PCR     Status: Abnormal   Collection Time    12/15/13 11:55 PM      Result Value Ref Range Status   C difficile by pcr POSITIVE (*) NEGATIVE Final   Comment: CRITICAL RESULT CALLED TO, READ BACK BY AND VERIFIED WITH:     CWyline Copas RN 10:55 12/15/13 ([email or web address])  STOOL CULTURE     Status: None   Collection Time    12/16/13  2:20 AM      Result Value Ref Range Status   Specimen Description STOOL   Final   Special Requests NONE   Final   Culture     Final   Value: NO SUSPICIOUS COLONIES, CONTINUING TO HOLD     Performed at [facility]   Report Status PENDING   Incomplete    RADIOLOGY STUDIES/RESULTS: Ct Abdomen Pelvis Wo Contrast  12/15/2013   CLINICAL DATA:  Lower abdominal pain, nausea and vomiting, with diarrhea, and with hematuria.  EXAM: CT ABDOMEN AND PELVIS WITHOUT CONTRAST  TECHNIQUE: Multidetector CT imaging of the abdomen and pelvis was performed following the standard protocol without intravenous contrast.  COMPARISON:  US ABDOMEN COMPLETE dated 12/12/2013; CT ABD/PELVIS W CM dated  11/11/2013; CT ABD/PELVIS W CM dated 07/25/2013  FINDINGS: Marked hepatic steatosis. Slight hepatomegaly. Normal spleen. Mild gallbladder distention without visible stones. No biliary ductal dilatation. Normal-appearing pancreas and adrenal glands. Lung apices clear.  Prominent left-sided renal scarring is noted with slight left renal atrophy; the right kidney is hypertrophied. No hydronephrosis is seen on the right or left. There are no renal or ureteral stones. There is no perinephric stranding to suggest previous stone passage.  The appendix is normal. The colon appears unusually smooth, particularly in the descending and sigmoid segments, compared with October 2014 with fluid-filled throughout, mild wall thickening, but no obstruction and no pericolonic inflammation. Considerations would include inflammatory bowel disease, autoimmune colitis, pseudomembranous colitis, or infectious colitis. Compared with 11/11/2013, the findings have also progressed.  Physiologic slightly greater than 2 cm left adnexal cyst may be slightly smaller. No free fluid no retroperitoneal, mesenteric, or inguinal adenopathy.  IMPRESSION: Nonspecific fluid-filled colon with mild loss of haustral markings, mild wall thickening, but no obstruction or pericolonic inflammation. Nonspecific findings of colitis are suggested.  Left renal scarring is stable.  No renal obstruction or calculi.  Marked hepatic steatosis with slight hepatomegaly also stable.   Electronically Signed   By: Rolla Flatten M.D.   On: 12/15/2013 17:50   US Abdomen Complete  12/12/2013   CLINICAL DATA:  Right upper quadrant pain  EXAM: ULTRASOUND ABDOMEN COMPLETE  COMPARISON:  CT 11/11/2013  FINDINGS: Gallbladder:  No gallstones or wall thickening visualized. No sonographic Murphy sign noted.  Common bile duct:  Diameter: Normal at 2.1 mm  Liver:  Diffusely increased in echogenicity.  No duct dilatation.  IVC:  No abnormality visualized.  Pancreas:  Visualized portion  unremarkable.  Spleen:  Size and appearance within normal limits.  Right Kidney:  Length: 11.1 cm. Echogenicity within normal limits. No mass or hydronephrosis  visualized.  Left Kidney:  Length: 8.5 cm . Echogenicity within normal limits. No mass or hydronephrosis visualized. Cortical scarring in the upper pole.  Abdominal aorta:  No aneurysm visualized.  Other findings:  No free fluid.  IMPRESSION: 1. Increased liver echogenicity most consistent with hepatic steatosis. 2. Normal gallbladder.   Electronically Signed   By: Suzy Bouchard M.D.   On: 12/12/2013 18:07    Oren Binet, MD  [facility] Pager:[phone]  If 7PM-7AM, please contact night-coverage [email or web address] Password [email or web address] 12/19/2013, 10:23 AM   LOS: 4 days

## 2013-12-19 NOTE — Progress Notes (Signed)
Patient ID: Jamie Allen, female   DOB: [birth year], 43 y.o.   MRN: [id number] [facility] Progress Note  Jamie Allen 43 y.o. 11/03/1970   Subjective: Feels bloated. Diarrhea continuing.  Objective: Vital signs in last 24 hours: Filed Vitals:   12/19/13 0448  BP: 121/81  Pulse: 96  Temp: 98.3 F (36.8 C)  Resp: 18    Physical Exam: Gen: alert, no acute distress Abd: distended; diffuse tenderness with minimal guarding, +BS  Lab Results:  Recent Labs  12/18/13 0625 12/19/13 0550  NA 140 138  K 3.4* 3.7  CL 106 104  CO2 21 20  GLUCOSE 91 84  BUN <3* <3*  CREATININE 0.50 0.49*  CALCIUM 7.8* 8.0*  MG  --  1.4*    Recent Labs  12/17/13 0556  AST 128*  ALT 57*  ALKPHOS 178*  BILITOT 0.8  PROT 5.7*  ALBUMIN 2.4*    Recent Labs  12/17/13 0556  WBC 5.7  NEUTROABS 4.0  HGB 9.7*  HCT 27.9*  MCV 112.0*  PLT 167   No results found for this basename: LABPROT, INR,  in the last 72 hours    Assessment/Plan: 43 yo with persisting diarrheal likely due to C. Diff. Recurrent bloating and early satiety of unclear etiology. Reportedly negative EGD with biopsies at [facility]. Flex sig here negative for endoscopic appearance of inflammation and no pseudomembranes seen. Biopsies pending. Gastric emptying scan planned for today to check for gastroparesis. NPO awaiting gastric emptying scan. Supportive care. Continue IV Flagyl and PO Vanco. Continue Florastor. May need colonoscopy prior to d/c if colon biopsies unrevealing and diarrhea persisting. Will follow.        Rison C. 12/19/2013, 11:25 AM

## 2013-12-19 NOTE — Progress Notes (Signed)
[name] Age: 43 y.o. Sex: female Date of Birth: Feb 24, 1971 Admit Date: 12/15/2013 Admitting Physician Theodis Blaze, MD [id number], Gabrielle Dare, MD  Subjective: Patient reports feeling much better than yesterday. Hast not vomited since last evening. Still having numerous episodes of diarrhea. Vomited after started regular diet at lunch time.  Assessment/Plan: Principal Problem: C Diff Colitis -C. difficile PCR positive. -Vomiting and diarrhea for 8 months. GI concerned about possibility of underlying ulcerative colitis. -Flexible sigmoidoscopy on 2/28 showed no abnormalities; Bx results are pending. -IV Flagyl and oral vancomycin since 12/16/13. Continue with Florastor. -GI pathogen panel negative.  Active Problems: Nausea with vomiting -Had numerous episodes of vomiting yesterday 3/1; But has not had any vomiting today.  -Upper endoscopy was done in 09/2013 at [facility] = Normal. -Continue monitoring and supportive care.  Chronic Diarrhea -Claims to have had diarrhea since last October. -EGD on 09/2013 at [facility]- was negative -Prior work up-HIV/Hep Serology/Celiac panel-Negative. -GI planning colonoscopy in the future. -Gastric emptying study scheduled for today 3/2.  NASH  -Patient with elevated LFTs, further workup to be done in the outpatient setting. -Prior work up done in the outpatient setting-shows negative alpha one anti-trypsin, ceruloplasmin,neg hep serology, however Anti smooth muscle antibody was positive.  Recent history of UTI -Currently symptoms are from colitis, no indication for treatment of UTI. -Continue to monitor off antibiotics.  Hypokalemia -This is likely secondary to diarrhea. -Improved on labs this morning 3/2.  Macrocytic anemia -Vitamin B12 levels within normal limits -Folate pending -? Secondary to alcohol use  Disposition: Remain inpatient  DVT Prophylaxis: Prophylactic Lovenox   Code  Status: Full code  Family Communication None at bedside  Procedures:  Flexible sigmoidoscopy 2/28  Gastric emptying 3/2  CONSULTS:  GI  Time spent 40 minutes-which includes 50% of the time with face-to-face with patient/ family and coordinating care related to the above assessment and plan.  MEDICATIONS: Scheduled Meds: . enoxaparin (LOVENOX) injection  40 mg Subcutaneous Q24H  . metronidazole  500 mg Intravenous Q8H  . nicotine  21 mg Transdermal Daily  . potassium chloride  40 mEq Oral Daily  . saccharomyces boulardii  250 mg Oral BID  . vancomycin  125 mg Oral 4 times per day   Continuous Infusions: . sodium chloride 0.9 % 1,000 mL with potassium chloride 40 mEq infusion 75 mL/hr at 12/18/13 1211   PRN Meds:.HYDROmorphone (DILAUDID) injection, ondansetron (ZOFRAN) IV, ondansetron, oxyCODONE, promethazine  Antibiotics: Anti-infectives   Start     Dose/Rate Route Frequency Ordered Stop   12/16/13 1500  vancomycin (VANCOCIN) 50 mg/mL oral solution 125 mg     125 mg Oral 4 times per day 12/16/13 1323     12/16/13 1330  vancomycin (VANCOCIN) 50 mg/mL oral solution 125 mg  Status:  Discontinued     125 mg Oral 4 times per day 12/16/13 1326 12/16/13 1332   12/16/13 0600  ciprofloxacin (CIPRO) IVPB 400 mg  Status:  Discontinued     400 mg 200 mL/hr over 60 Minutes Intravenous Every 12 hours 12/16/13 0021 12/16/13 1048   12/16/13 0400  metroNIDAZOLE (FLAGYL) IVPB 500 mg     500 mg 100 mL/hr over 60 Minutes Intravenous Every 8 hours 12/16/13 0021     12/15/13 1815  ciprofloxacin (CIPRO) IVPB 400 mg     400 mg 200 mL/hr over 60 Minutes Intravenous  Once 12/15/13 1806 12/15/13 2025   12/15/13 1815  metroNIDAZOLE (FLAGYL) IVPB  500 mg     500 mg 100 mL/hr over 60 Minutes Intravenous  Once 12/15/13 1806 12/15/13 1930      PHYSICAL EXAM: Vital signs in last 24 hours: Filed Vitals:   12/18/13 0547 12/18/13 1423 12/18/13 2034 12/19/13 0448  BP: 136/90 135/88 131/87 121/81   Pulse: 90 78 80 96  Temp: 98 F (36.7 C) 98.9 F (37.2 C) 99 F (37.2 C) 98.3 F (36.8 C)  TempSrc: Oral Oral Oral Oral  Resp: 18 18 18 18   Height:      Weight: 53.207 kg (117 lb 4.8 oz)     SpO2: 98% 99% 97% 99%    Weight change:  Filed Weights   12/16/13 1445 12/17/13 0514 12/18/13 0547  Weight: 53.4 kg (117 lb 11.6 oz) 53.434 kg (117 lb 12.8 oz) 53.207 kg (117 lb 4.8 oz)   Body mass index is 21.45 kg/(m^2).   Gen Exam: Awake and alert and oriented with clear speech.   Neck: Supple, No JVD or masses.  Chest: Clear to ascultation bilaterally. No rhonchi or wheeze. CVS: S1 S2 Regular, no murmurs.  Abdomen: Soft, BS +, with some mild right sided tenderness, non distended.  Extremities: No edema, lower extremities warm to touch, with good strength. Neurologic: PERRL, EOM intact. Skin: No Rash.    Intake/Output from previous day:  Intake/Output Summary (Last 24 hours) at 12/19/13 0925 Last data filed at 12/19/13 0700  Gross per 24 hour  Intake   1525 ml  Output      1 ml  Net   1524 ml   LAB RESULTS: CBC  Recent Labs Lab 12/12/13 1625 12/15/13 1625 12/16/13 0540 12/17/13 0556  WBC 6.8 17.5* 10.7* 5.7  HGB 12.4 12.0 9.3* 9.7*  HCT 34.6* 33.7* 26.8* 27.9*  PLT 189 177 138* 167  MCV 109.5* 111.2* 113.6* 112.0*  MCH 39.2* 39.6* 39.4* 39.0*  MCHC 35.8 35.6 34.7 34.8  RDW 13.7 14.7 15.7* 15.6*  LYMPHSABS 1.1 0.9  --  0.9  MONOABS 0.4 0.9  --  0.5  EOSABS 0.1 0.2  --  0.3  BASOSABS 0.0 0.0  --  0.0   Chemistries   Recent Labs Lab 12/15/13 1625 12/16/13 0540 12/17/13 0556 12/18/13 0625 12/19/13 0550  NA 136* 139 139 140 138  K 3.4* 3.1* 3.2* 3.4* 3.7  CL 93* 106 103 106 104  CO2 24 21 20 21 20   GLUCOSE 148* 115* 83 91 84  BUN 3* 3* <3* <3* <3*  CREATININE 0.60 0.57 0.47* 0.50 0.49*  CALCIUM 9.4 7.3* 7.8* 7.8* 8.0*  MG  --   --   --   --  1.4*    GFR Estimated Creatinine Clearance: 71.7 ml/min (by C-G formula based on Cr of 0.49).  Coagulation  profile  Recent Labs Lab 12/16/13 0540  INR 1.23    Recent Labs  12/16/13 1226 12/17/13 1220 12/18/13 1121  VITAMINB12 1725*  --   --   FERRITIN  --  710*  --   TIBC  --   --  160*  IRON  --   --  36*   MICROBIOLOGY: Recent Results (from the past 240 hour(s))  CLOSTRIDIUM DIFFICILE BY PCR     Status: Abnormal   Collection Time    12/15/13 11:55 PM      Result Value Ref Range Status   C difficile by pcr POSITIVE (*) NEGATIVE Final   Comment: CRITICAL RESULT CALLED TO, READ BACK BY AND VERIFIED WITH:  C. DRIGGERS RN 10:55 12/15/13 ([email or web address])  STOOL CULTURE     Status: None   Collection Time    12/16/13  2:20 AM      Result Value Ref Range Status   Specimen Description STOOL   Final   Special Requests NONE   Final   Culture     Final   Value: NO SUSPICIOUS COLONIES, CONTINUING TO HOLD     Performed at [facility]   Report Status PENDING   Incomplete    RADIOLOGY STUDIES/RESULTS: Ct Abdomen Pelvis Wo Contrast  12/15/2013   CLINICAL DATA:  Lower abdominal pain, nausea and vomiting, with diarrhea, and with hematuria.  EXAM: CT ABDOMEN AND PELVIS WITHOUT CONTRAST  TECHNIQUE: Multidetector CT imaging of the abdomen and pelvis was performed following the standard protocol without intravenous contrast.  COMPARISON:  US ABDOMEN COMPLETE dated 12/12/2013; CT ABD/PELVIS W CM dated 11/11/2013; CT ABD/PELVIS W CM dated 07/25/2013  FINDINGS: Marked hepatic steatosis. Slight hepatomegaly. Normal spleen. Mild gallbladder distention without visible stones. No biliary ductal dilatation. Normal-appearing pancreas and adrenal glands. Lung apices clear.  Prominent left-sided renal scarring is noted with slight left renal atrophy; the right kidney is hypertrophied. No hydronephrosis is seen on the right or left. There are no renal or ureteral stones. There is no perinephric stranding to suggest previous stone passage.  The appendix is normal. The colon appears unusually smooth, particularly  in the descending and sigmoid segments, compared with October 2014 with fluid-filled throughout, mild wall thickening, but no obstruction and no pericolonic inflammation. Considerations would include inflammatory bowel disease, autoimmune colitis, pseudomembranous colitis, or infectious colitis. Compared with 11/11/2013, the findings have also progressed.  Physiologic slightly greater than 2 cm left adnexal cyst may be slightly smaller. No free fluid no retroperitoneal, mesenteric, or inguinal adenopathy.  IMPRESSION: Nonspecific fluid-filled colon with mild loss of haustral markings, mild wall thickening, but no obstruction or pericolonic inflammation. Nonspecific findings of colitis are suggested.  Left renal scarring is stable.  No renal obstruction or calculi.  Marked hepatic steatosis with slight hepatomegaly also stable.   Electronically Signed   By: Rolla Flatten M.D.   On: 12/15/2013 17:50   US Abdomen Complete  12/12/2013   CLINICAL DATA:  Right upper quadrant pain  EXAM: ULTRASOUND ABDOMEN COMPLETE  COMPARISON:  CT 11/11/2013  FINDINGS: Gallbladder:  No gallstones or wall thickening visualized. No sonographic Murphy sign noted.  Common bile duct:  Diameter: Normal at 2.1 mm  Liver:  Diffusely increased in echogenicity.  No duct dilatation.  IVC:  No abnormality visualized.  Pancreas:  Visualized portion unremarkable.  Spleen:  Size and appearance within normal limits.  Right Kidney:  Length: 11.1 cm. Echogenicity within normal limits. No mass or hydronephrosis visualized.  Left Kidney:  Length: 8.5 cm . Echogenicity within normal limits. No mass or hydronephrosis visualized. Cortical scarring in the upper pole.  Abdominal aorta:  No aneurysm visualized.  Other findings:  No free fluid.  IMPRESSION: 1. Increased liver echogenicity most consistent with hepatic steatosis. 2. Normal gallbladder.   Electronically Signed   By: Suzy Bouchard M.D.   On: 12/12/2013 18:07   Ruben Im PA-S  [facility] Pager:[phone]  If 7PM-7AM, please contact night-coverage [email or web address] Password [email or web address] 12/19/2013, 9:25 AM   LOS: 5 days     Addendum  Patient seen and examined, chart and data base reviewed.  I agree with the above assessment and plan.  For full details please see Mrs. Ruben Im  PA-S note.  I addended the above note with necessary change.   Birdie Hopes, MD [facility] Pager: [phone] 12/21/2013, 5:19 PM

## 2013-12-20 ENCOUNTER — Inpatient Hospital Stay (HOSPITAL_COMMUNITY): Payer: No Typology Code available for payment source

## 2013-12-20 DIAGNOSIS — R1110 Vomiting, unspecified: Secondary | ICD-10-CM

## 2013-12-20 LAB — CBC
HEMATOCRIT: 29.6 % — AB (ref 36.0–46.0)
HEMOGLOBIN: 10.4 g/dL — AB (ref 12.0–15.0)
MCH: 38.7 pg — ABNORMAL HIGH (ref 26.0–34.0)
MCHC: 35.1 g/dL (ref 30.0–36.0)
MCV: 110 fL — ABNORMAL HIGH (ref 78.0–100.0)
Platelets: 332 10*3/uL (ref 150–400)
RBC: 2.69 MIL/uL — ABNORMAL LOW (ref 3.87–5.11)
RDW: 15.1 % (ref 11.5–15.5)
WBC: 6.3 10*3/uL (ref 4.0–10.5)

## 2013-12-20 LAB — BASIC METABOLIC PANEL
CO2: 20 mEq/L (ref 19–32)
CREATININE: 0.51 mg/dL (ref 0.50–1.10)
Calcium: 8.2 mg/dL — ABNORMAL LOW (ref 8.4–10.5)
Chloride: 106 mEq/L (ref 96–112)
GFR calc Af Amer: >90 mL/min (ref >90)
GFR calc non Af Amer: >90 mL/min (ref >90)
GLUCOSE: 87 mg/dL (ref 70–99)
Potassium: 4.3 mEq/L (ref 3.7–5.3)
Sodium: 138 mEq/L (ref 137–147)

## 2013-12-20 LAB — STOOL CULTURE

## 2013-12-20 MED ORDER — KETOROLAC TROMETHAMINE 15 MG/ML IJ SOLN
15.0000 mg | Freq: Once | INTRAMUSCULAR | Status: AC
  Administered 2013-12-20: 15 mg via INTRAVENOUS
  Filled 2013-12-20: qty 1

## 2013-12-20 MED ORDER — LORAZEPAM 2 MG/ML IJ SOLN
0.5000 mg | Freq: Four times a day (QID) | INTRAMUSCULAR | Status: DC | PRN
  Administered 2013-12-20 – 2013-12-21 (×2): 0.5 mg via INTRAVENOUS
  Filled 2013-12-20 (×2): qty 1

## 2013-12-20 MED ORDER — VANCOMYCIN 50 MG/ML ORAL SOLUTION
250.0000 mg | Freq: Four times a day (QID) | ORAL | Status: DC
  Administered 2013-12-20 – 2013-12-23 (×12): 250 mg via ORAL
  Filled 2013-12-20 (×15): qty 5

## 2013-12-20 NOTE — Progress Notes (Signed)
Patient ID: Jamie Allen, female   DOB: 19-Jun-1971, 43 y.o.   MRN: [id number] [facility] Gastroenterology Progress Note  Jamie Allen 43 y.o. 1970/12/28   Subjective: Continues to have watery diarrhea but reports frequency thus far today is 5 compared with (30) thirty times yesterday. Denies N/V/abdominal pain. Less bloating today. Gastric emptying scan negative today.  Objective: Vital signs: Filed Vitals:   12/20/13 1424  BP: 117/82  Pulse: 99  Temp: 98.8 F (37.1 C)  Resp: 18    Physical Exam: Gen: alert, no acute distress  Abd: minimal RLQ tenderness with minimal guarding, soft, mild distention, +BS  Lab Results:  Recent Labs  12/18/13 0625 12/19/13 0550 12/20/13 0450  NA 140 138 138  K 3.4* 3.7 4.3  CL 106 104 106  CO2 21 20 20   GLUCOSE 91 84 87  BUN <3* <3* <3*  CREATININE 0.50 0.49* 0.51  CALCIUM 7.8* 8.0* 8.2*  MG  --  1.4*  --    No results found for this basename: AST, ALT, ALKPHOS, BILITOT, PROT, ALBUMIN,  in the last 72 hours  Recent Labs  12/20/13 0450  WBC 6.3  HGB 10.4*  HCT 29.6*  MCV 110.0*  PLT 332      Assessment/Plan: 43 yo with chronic diarrhea and recent + C diff (12/15/13) who has failed to respond to current treatment regimen of IV Flagyl, PO Vanco, and probiotics. Gastric emptying scan normal. Flex sig with normal mucosa and negative biopsies. She could have pseudomembranes on her right side although unusual to have right-sided without left-sided involvement. No evidence of ulcerative colitis on flex sig. Crohn's still in differential. Malignancy less likely. Will increase Vancomycin to 250 mg PO QID and D/C Flagyl. Will consider Vanco enemas but with lack of left-sided pseudomembranes not convinced that this will yield better results. If symptoms persisting, despite increase in Vancomycin dose then will do colonoscopy later this week. Change to full liquids. Continue supportive care.   [place] C. 12/20/2013, 2:56 PM

## 2013-12-20 NOTE — Progress Notes (Signed)
[name] Age: 43 y.o. Sex: female Date of Birth: April 27, 1971 Admit Date: 12/15/2013 Admitting Physician Theodis Blaze, MD [id number], Gabrielle Dare, MD  Subjective: Patient reports 30x bowel movements over the last 24 hours.  Assessment/Plan: Principal Problem:   C Diff Colitis - C. difficile PCR positive, however has had vomiting and diarrhea for 8 months. GI concerned about possibility of underlying ulcerative   colitis, patient underwent flexible sigmoidoscopy on 2/28-no abnormalities seen, pathology from descending colon Bx is negative. - Currently on IV Flagyl and oral vancomycin-started on 12/16/13. Continue with Florastor - GI pathogen panel negative.  Active Problems:    Nausea with vomiting -No recent vomiting. (the patient has been NPO for approximately 36 hours) -Upper endoscopy was done in 09/2013 at [facility] = Normal. - Await gastric emptying study results 3/3  Chronic Diarrhea -claims to have diarrhea since last October, some contribution to her diarrhea by C. difficile colitis.  -Unfortunately continues to have diarrhea even with IV Flagyl/oral vancomycin since 2/27. -EGD on 09/2013 at [facility]. Sigmoidoscopy on 2/28 essentially negative, biopsy negative. -Prior work up-HIV/Hep Serology/Celiac panel-negative -GI planning colonoscopy at some point  NASH  -Patient with elevated LFTs, further workup to be done in the outpatient setting. -prior work up done in the outpatient setting-showes negative alpha one anti-trypsin, ceruloplasmin,neg hep serology, however Anti smooth muscle antibody was positive.  Recent history of UTI - Currently symptoms are from colitis, no indication for treatment of UTI. Continue to monitor off antibiotics.  Hypokalemia - This is likely secondary to diarrhea. - Stable  Macrocytic anemia - Vitamin B12 levels within normal limits,  Folate pending -? Secondary to alcohol use - GGT [id number] (uncertain if this  would be so elevated in NASH)  Disposition: Remain inpatient  DVT Prophylaxis: Prophylactic Lovenox   Code Status: Full code  Family Communication None at bedside  Procedures:  Flexible sigmoidoscopy 2/28  CONSULTS:  GI  MEDICATIONS: Scheduled Meds: . enoxaparin (LOVENOX) injection  40 mg Subcutaneous Q24H  . metronidazole  500 mg Intravenous Q8H  . nicotine  21 mg Transdermal Daily  . potassium chloride  40 mEq Oral Daily  . saccharomyces boulardii  250 mg Oral BID  . vancomycin  125 mg Oral 4 times per day   Continuous Infusions: . sodium chloride 0.9 % 1,000 mL with potassium chloride 40 mEq infusion 75 mL/hr at 12/19/13 2309   PRN Meds:.diphenhydrAMINE, HYDROmorphone (DILAUDID) injection, LORazepam, ondansetron (ZOFRAN) IV, ondansetron, oxyCODONE, promethazine  Antibiotics: Anti-infectives   Start     Dose/Rate Route Frequency Ordered Stop   12/16/13 1500  vancomycin (VANCOCIN) 50 mg/mL oral solution 125 mg     125 mg Oral 4 times per day 12/16/13 1323     12/16/13 1330  vancomycin (VANCOCIN) 50 mg/mL oral solution 125 mg  Status:  Discontinued     125 mg Oral 4 times per day 12/16/13 1326 12/16/13 1332   12/16/13 0600  ciprofloxacin (CIPRO) IVPB 400 mg  Status:  Discontinued     400 mg 200 mL/hr over 60 Minutes Intravenous Every 12 hours 12/16/13 0021 12/16/13 1048   12/16/13 0400  metroNIDAZOLE (FLAGYL) IVPB 500 mg     500 mg 100 mL/hr over 60 Minutes Intravenous Every 8 hours 12/16/13 0021     12/15/13 1815  ciprofloxacin (CIPRO) IVPB 400 mg     400 mg 200 mL/hr over 60 Minutes Intravenous  Once 12/15/13 1806 12/15/13 2025   12/15/13  1815  metroNIDAZOLE (FLAGYL) IVPB 500 mg     500 mg 100 mL/hr over 60 Minutes Intravenous  Once 12/15/13 1806 12/15/13 1930      PHYSICAL EXAM: Vital signs in last 24 hours: Filed Vitals:   12/19/13 0448 12/19/13 1433 12/19/13 2011 12/20/13 0523  BP: 121/81 131/87 142/94 104/71  Pulse: 96 95 88 83  Temp: 98.3 F (36.8  C) 98.2 F (36.8 C) 98.5 F (36.9 C) 98.6 F (37 C)  TempSrc: Oral Oral Oral Oral  Resp: 18 18 18 18   Height:      Weight:    49.7 kg (109 lb 9.1 oz)  SpO2: 99% 95% 99% 96%    Weight change:  Filed Weights   12/17/13 0514 12/18/13 0547 12/20/13 0523  Weight: 53.434 kg (117 lb 12.8 oz) 53.207 kg (117 lb 4.8 oz) 49.7 kg (109 lb 9.1 oz)   Body mass index is 20.04 kg/(m^2).   Gen Exam: Awake and alert with clear speech.  Sitting up in bed.  NPO.  Slightly anxious Neck: Supple, No JVD.  Chest: B/L Clear.  No rales CVS: S1 S2 Regular, no murmurs.  Abdomen: soft, BS +, minimal tender in the periumbilical area, non distended.  Extremities: no edema, lower extremities warm to touch. Neurologic: Non Focal.    Intake/Output from previous day:  Intake/Output Summary (Last 24 hours) at 12/20/13 1221 Last data filed at 12/20/13 0600  Gross per 24 hour  Intake   1985 ml  Output      0 ml  Net   1985 ml     LAB RESULTS: CBC  Recent Labs Lab 12/15/13 1625 12/16/13 0540 12/17/13 0556 12/20/13 0450  WBC 17.5* 10.7* 5.7 6.3  HGB 12.0 9.3* 9.7* 10.4*  HCT 33.7* 26.8* 27.9* 29.6*  PLT 177 138* 167 332  MCV 111.2* 113.6* 112.0* 110.0*  MCH 39.6* 39.4* 39.0* 38.7*  MCHC 35.6 34.7 34.8 35.1  RDW 14.7 15.7* 15.6* 15.1  LYMPHSABS 0.9  --  0.9  --   MONOABS 0.9  --  0.5  --   EOSABS 0.2  --  0.3  --   BASOSABS 0.0  --  0.0  --     Chemistries   Recent Labs Lab 12/16/13 0540 12/17/13 0556 12/18/13 0625 12/19/13 0550 12/20/13 0450  NA 139 139 140 138 138  K 3.1* 3.2* 3.4* 3.7 4.3  CL 106 103 106 104 106  CO2 21 20 21 20 20   GLUCOSE 115* 83 91 84 87  BUN 3* <3* <3* <3* <3*  CREATININE 0.57 0.47* 0.50 0.49* 0.51  CALCIUM 7.3* 7.8* 7.8* 8.0* 8.2*  MG  --   --   --  1.4*  --     Coagulation profile  Recent Labs Lab 12/16/13 0540  INR 1.23     Recent Labs  12/18/13 1121  TIBC 160*  IRON 36*    MICROBIOLOGY: Recent Results (from the past 240 hour(s))    CLOSTRIDIUM DIFFICILE BY PCR     Status: Abnormal   Collection Time    12/15/13 11:55 PM      Result Value Ref Range Status   C difficile by pcr POSITIVE (*) NEGATIVE Final   Comment: CRITICAL RESULT CALLED TO, READ BACK BY AND VERIFIED WITH:     Gaetano Net RN 10:55 12/15/13 ([email or web address])  STOOL CULTURE     Status: None   Collection Time    12/16/13  2:20 AM      Result Value  Ref Range Status   Specimen Description STOOL   Final   Special Requests NONE   Final   Culture     Final   Value: NO SALMONELLA, SHIGELLA, CAMPYLOBACTER, YERSINIA, OR E.COLI 0157:H7 ISOLATED     Performed at [facility]   Report Status 12/20/2013 FINAL   Final    Pathology 12/17/2013 Colon, biopsy, Descending and sigmoid - HISTOLOGICALLY NORMAL COLONIC MUCOSA - NEGATIVE FOR MORPHOLOGIC FEATURES OF IDIOPATHIC INFLAMMATORY BOWEL DISEASE,   MICROSCOPIC COLITIS OR INFECTIOUS COLITIS - NEGATIVE FOR DYSPLASIA.   RADIOLOGY STUDIES/RESULTS: Ct Abdomen Pelvis Wo Contrast  12/15/2013   CLINICAL DATA:  Lower abdominal pain, nausea and vomiting, with diarrhea, and with hematuria.  EXAM: CT ABDOMEN AND PELVIS WITHOUT CONTRAST  TECHNIQUE: Multidetector CT imaging of the abdomen and pelvis was performed following the standard protocol without intravenous contrast.  COMPARISON:  US ABDOMEN COMPLETE dated 12/12/2013; CT ABD/PELVIS W CM dated 11/11/2013; CT ABD/PELVIS W CM dated 07/25/2013  FINDINGS: Marked hepatic steatosis. Slight hepatomegaly. Normal spleen. Mild gallbladder distention without visible stones. No biliary ductal dilatation. Normal-appearing pancreas and adrenal glands. Lung apices clear.  Prominent left-sided renal scarring is noted with slight left renal atrophy; the right kidney is hypertrophied. No hydronephrosis is seen on the right or left. There are no renal or ureteral stones. There is no perinephric stranding to suggest previous stone passage.  The appendix is normal. The colon appears unusually smooth,  particularly in the descending and sigmoid segments, compared with October 2014 with fluid-filled throughout, mild wall thickening, but no obstruction and no pericolonic inflammation. Considerations would include inflammatory bowel disease, autoimmune colitis, pseudomembranous colitis, or infectious colitis. Compared with 11/11/2013, the findings have also progressed.  Physiologic slightly greater than 2 cm left adnexal cyst may be slightly smaller. No free fluid no retroperitoneal, mesenteric, or inguinal adenopathy.  IMPRESSION: Nonspecific fluid-filled colon with mild loss of haustral markings, mild wall thickening, but no obstruction or pericolonic inflammation. Nonspecific findings of colitis are suggested.  Left renal scarring is stable.  No renal obstruction or calculi.  Marked hepatic steatosis with slight hepatomegaly also stable.   Electronically Signed   By: Rolla Flatten M.D.   On: 12/15/2013 17:50   US Abdomen Complete  12/12/2013   CLINICAL DATA:  Right upper quadrant pain  EXAM: ULTRASOUND ABDOMEN COMPLETE  COMPARISON:  CT 11/11/2013  FINDINGS: Gallbladder:  No gallstones or wall thickening visualized. No sonographic Murphy sign noted.  Common bile duct:  Diameter: Normal at 2.1 mm  Liver:  Diffusely increased in echogenicity.  No duct dilatation.  IVC:  No abnormality visualized.  Pancreas:  Visualized portion unremarkable.  Spleen:  Size and appearance within normal limits.  Right Kidney:  Length: 11.1 cm. Echogenicity within normal limits. No mass or hydronephrosis visualized.  Left Kidney:  Length: 8.5 cm . Echogenicity within normal limits. No mass or hydronephrosis visualized. Cortical scarring in the upper pole.  Abdominal aorta:  No aneurysm visualized.  Other findings:  No free fluid.  IMPRESSION: 1. Increased liver echogenicity most consistent with hepatic steatosis. 2. Normal gallbladder.   Electronically Signed   By: Suzy Bouchard M.D.   On: 12/12/2013 18:07    Karen Kitchens [facility] Pager:[phone]  If 7PM-7AM, please contact night-coverage [email or web address] Password [email or web address] 12/20/2013, 12:21 PM   LOS: 5 days   Attending Patient seen and examined,agree with the assessment and plan as outlined above, diarrhea continues, await GI follow up  Nena Alexander MD

## 2013-12-20 NOTE — Care Management Note (Addendum)
    Page 1 of 1   12/23/2013     11:37:35 AM   CARE MANAGEMENT NOTE 12/23/2013  Patient:  Jamie Allen, Jamie Allen   Account Number:  [id number]  Date Initiated:  12/20/2013  Documentation initiated by:  Tomi Bamberger  Subjective/Objective Assessment:   dx cdiff  admit     Action/Plan:   Anticipated DC Date:  12/23/2013   Anticipated DC Plan:  [place]  CM consult      Choice offered to / List presented to:             Status of service:  Completed, signed off Medicare Important Message given?   (If response is "NO", the following Medicare IM given date fields will be blank) Date Medicare IM given:   Date Additional Medicare IM given:    Discharge Disposition:  HOME/SELF CARE  Per UR Regulation:  Reviewed for med. necessity/level of care/duration of stay  If discussed at [place] of Stay Meetings, dates discussed:   12/20/2013  12/22/2013    Comments:  12/23/13 [place], BSN [phone] patient is for dc today, will need po vanc capsules , NCM spoke with [facility] and they willl fill for patient, NCM faxed scripts over to them, awaiting price.  12/20/13 [place] ,BSN [id number] patient conts with diarrhea and vomiting, s/psigmoidscopy on 2/28 and gastric empyting study, conts on iv abx.

## 2013-12-21 ENCOUNTER — Inpatient Hospital Stay (HOSPITAL_COMMUNITY): Payer: No Typology Code available for payment source

## 2013-12-21 DIAGNOSIS — E860 Dehydration: Secondary | ICD-10-CM

## 2013-12-21 DIAGNOSIS — R109 Unspecified abdominal pain: Secondary | ICD-10-CM

## 2013-12-21 LAB — BASIC METABOLIC PANEL
CALCIUM: 9.4 mg/dL (ref 8.4–10.5)
CO2: 22 meq/L (ref 19–32)
CREATININE: 0.48 mg/dL — AB (ref 0.50–1.10)
Chloride: 100 mEq/L (ref 96–112)
GFR calc Af Amer: >90 mL/min (ref >90)
GFR calc non Af Amer: >90 mL/min (ref >90)
Glucose, Bld: 94 mg/dL (ref 70–99)
Potassium: 4.8 mEq/L (ref 3.7–5.3)
Sodium: 135 mEq/L — ABNORMAL LOW (ref 137–147)

## 2013-12-21 LAB — CBC
HCT: 35.9 % — ABNORMAL LOW (ref 36.0–46.0)
Hemoglobin: 12.4 g/dL (ref 12.0–15.0)
MCH: 38.8 pg — AB (ref 26.0–34.0)
MCHC: 34.5 g/dL (ref 30.0–36.0)
MCV: 112.2 fL — AB (ref 78.0–100.0)
PLATELETS: 438 10*3/uL — AB (ref 150–400)
RBC: 3.2 MIL/uL — ABNORMAL LOW (ref 3.87–5.11)
RDW: 14.9 % (ref 11.5–15.5)
WBC: 9.7 10*3/uL (ref 4.0–10.5)

## 2013-12-21 MED ORDER — CILIDINIUM-CHLORDIAZEPOXIDE 2.5-5 MG PO CAPS
1.0000 | ORAL_CAPSULE | Freq: Three times a day (TID) | ORAL | Status: DC
  Administered 2013-12-21 – 2013-12-23 (×6): 1 via ORAL
  Filled 2013-12-21 (×12): qty 1

## 2013-12-21 MED ORDER — SODIUM CHLORIDE 0.9 % IV SOLN
INTRAVENOUS | Status: DC
  Administered 2013-12-21 – 2013-12-22 (×2): via INTRAVENOUS

## 2013-12-21 NOTE — Progress Notes (Signed)
Patient ID: Jamie Allen, female   DOB: [birth year], 43 y.o.   MRN: [id number] [facility]  Jamie Allen 43 y.o. [birth year]   Subjective: Reports vomiting her lunch after a few bites and having lower abdominal pain now. BMs had decreased overnight in frequency but were still loose.  Objective: Vital signs in last 24 hours: Filed Vitals:   12/21/13 0500  BP: 134/90  Pulse: 93  Temp: 99.1 F (37.3 C)  Resp: 16    Physical Exam: Gen: awake, uncomfortable Abd: RLQ, suprapubic tenderness with guarding, soft, nondistended, +BS  Lab Results:  Recent Labs  12/19/13 0550 12/20/13 0450 12/21/13 0630  NA 138 138 135*  K 3.7 4.3 4.8  CL 104 106 100  CO2 20 20 22   GLUCOSE 84 87 94  BUN <3* <3* <3*  CREATININE 0.49* 0.51 0.48*  CALCIUM 8.0* 8.2* 9.4  MG 1.4*  --   --    No results found for this basename: AST, ALT, ALKPHOS, BILITOT, PROT, ALBUMIN,  in the last 72 hours  Recent Labs  12/20/13 0450 12/21/13 0630  WBC 6.3 9.7  HGB 10.4* 12.4  HCT 29.6* 35.9*  MCV 110.0* 112.2*  PLT 332 438*   No results found for this basename: LABPROT, INR,  in the last 72 hours    Assessment/Plan: 43 yo with C diff colitis who is now having abdominal pain and vomiting. Will make NPO. Obtain abd Xray. Continue PO Vancomycin and Florastor. IVFs.   Jamie Allen C. 12/21/2013, 4:37 PM

## 2013-12-22 LAB — BASIC METABOLIC PANEL
BUN: <3 mg/dL — ABNORMAL LOW (ref 6–23)
CALCIUM: 9.1 mg/dL (ref 8.4–10.5)
CO2: 21 mEq/L (ref 19–32)
Chloride: 102 mEq/L (ref 96–112)
Creatinine, Ser: 0.56 mg/dL (ref 0.50–1.10)
GFR calc Af Amer: >90 mL/min (ref >90)
Glucose, Bld: 85 mg/dL (ref 70–99)
Potassium: 4.5 mEq/L (ref 3.7–5.3)
SODIUM: 138 meq/L (ref 137–147)

## 2013-12-22 MED ORDER — ZOLPIDEM TARTRATE 5 MG PO TABS
5.0000 mg | ORAL_TABLET | Freq: Every evening | ORAL | Status: DC | PRN
  Administered 2013-12-22: 5 mg via ORAL
  Filled 2013-12-22: qty 1

## 2013-12-22 NOTE — Progress Notes (Signed)
Addendum  Patient seen and examined, chart and data base reviewed.  I agree with the above assessment and plan.  For full details please see Mrs. Imogene Burn PA note.  C. difficile colitis and intractable nausea and vomiting.  Started to improve, re advanced to full liquids.   Birdie Hopes, MD [facility] Pager: [phone] 12/22/2013, 4:21 PM

## 2013-12-22 NOTE — Progress Notes (Signed)
[name] Age: 43 y.o. Sex: female Date of Birth: [birth year] Admit Date: 12/15/2013 Admitting Physician Theodis Blaze, MD [id number], Gabrielle Dare, MD  Subjective: Patient reports continued improvement fewer stools.  Becoming thicker.  No abdominal pain.  Assessment/Plan: Principal Problem: C Diff Colitis -Slow improvement.  Plan for d/c home on oral vanc 3/6 if continued improvement. -C. difficile PCR positive. -Vomiting and diarrhea for 8 months. GI concerned about possibility of underlying ulcerative colitis.  -Flexible sigmoidoscopy on 2/28 showed no abnormalities; Bx results are pending. -IV Flagyl and oral vancomycin since 12/16/13. Continue with Florastor. -GI pathogen panel negative. -Outpatient colonoscopy.  Active Problems: Nausea with vomiting -No vomiting with full liquids.  Vomits solif food.  -Upper endoscopy was done in 09/2013 at [facility] = Normal. -Gastric emptying study normal.  -Abdominal xray 3/4 normal. -Continue monitoring and supportive care.  Chronic Diarrhea -Slowly improving. -Claims to have had diarrhea since last October. -EGD on 09/2013 at [facility]- was negative -Prior work up-HIV/Hep Serology/Celiac panel-Negative. -GI planning colonoscopy in the future.  NASH  -Patient with elevated LFTs, further workup to be done in the outpatient setting. -Prior work up done in the outpatient setting-shows negative alpha one anti-trypsin, ceruloplasmin,neg hep serology, however Anti smooth muscle antibody was positive.  Recent history of UTI -Currently symptoms are from colitis, no indication for treatment of UTI. -Continue to monitor off antibiotics.  Hypokalemia -This is likely secondary to diarrhea. -resolved with supplementation.  Now stable.  Macrocytic anemia -Vitamin B12 levels within normal limits -Folate pending -? Secondary to alcohol use  Disposition: Remain inpatient  DVT Prophylaxis: Prophylactic  Lovenox   Code Status: Full code  Family Communication None at bedside  Procedures:  Flexible sigmoidoscopy 2/28  Gastric emptying 3/2  CONSULTS:  GI  Time spent 40 minutes-which includes 50% of the time with face-to-face with patient/ family and coordinating care related to the above assessment and plan.  MEDICATIONS: Scheduled Meds: . clidinium-chlordiazePOXIDE  1 capsule Oral TID AC  . enoxaparin (LOVENOX) injection  40 mg Subcutaneous Q24H  . nicotine  21 mg Transdermal Daily  . saccharomyces boulardii  250 mg Oral BID  . vancomycin  250 mg Oral 4 times per day   Continuous Infusions:   PRN Meds:.diphenhydrAMINE, HYDROmorphone (DILAUDID) injection, ondansetron (ZOFRAN) IV, ondansetron, oxyCODONE, promethazine  Antibiotics: Anti-infectives   Start     Dose/Rate Route Frequency Ordered Stop   12/20/13 1800  vancomycin (VANCOCIN) 50 mg/mL oral solution 250 mg     250 mg Oral 4 times per day 12/20/13 1503     12/16/13 1500  vancomycin (VANCOCIN) 50 mg/mL oral solution 125 mg  Status:  Discontinued     125 mg Oral 4 times per day 12/16/13 1323 12/20/13 1503   12/16/13 1330  vancomycin (VANCOCIN) 50 mg/mL oral solution 125 mg  Status:  Discontinued     125 mg Oral 4 times per day 12/16/13 1326 12/16/13 1332   12/16/13 0600  ciprofloxacin (CIPRO) IVPB 400 mg  Status:  Discontinued     400 mg 200 mL/hr over 60 Minutes Intravenous Every 12 hours 12/16/13 0021 12/16/13 1048   12/16/13 0400  metroNIDAZOLE (FLAGYL) IVPB 500 mg  Status:  Discontinued     500 mg 100 mL/hr over 60 Minutes Intravenous Every 8 hours 12/16/13 0021 12/20/13 1503   12/15/13 1815  ciprofloxacin (CIPRO) IVPB 400 mg     400 mg 200 mL/hr over 60 Minutes Intravenous  Once 12/15/13 1806 12/15/13 2025   12/15/13 1815  metroNIDAZOLE (FLAGYL) IVPB 500 mg     500 mg 100 mL/hr over 60 Minutes Intravenous  Once 12/15/13 1806 12/15/13 1930      PHYSICAL EXAM: Vital signs in last 24 hours: Filed Vitals:     12/21/13 2114 12/22/13 0537 12/22/13 0951 12/22/13 1408  BP: 119/79 119/89 119/83 118/82  Pulse: 74 85 70 98  Temp: 98.6 F (37 C) 98.9 F (37.2 C) 98.7 F (37.1 C) 98.5 F (36.9 C)  TempSrc: Oral Oral Oral Oral  Resp: 15 18 18 18   Height:      Weight:  48.8 kg (107 lb 9.4 oz)    SpO2: 97% 95% 95% 93%    Weight change: -0.9 kg (-1 lb 15.8 oz) Filed Weights   12/20/13 0523 12/21/13 0500 12/22/13 0537  Weight: 49.7 kg (109 lb 9.1 oz) 49.7 kg (109 lb 9.1 oz) 48.8 kg (107 lb 9.4 oz)   Body mass index is 19.67 kg/(m^2).   Gen Exam: Awake and alert and oriented with clear speech.   Neck: Supple, No JVD or masses.  Chest: Clear to ascultation bilaterally. No rhonchi or wheeze. CVS: S1 S2 Regular, no murmurs.  Abdomen: Soft, BS +, with some mild right sided tenderness, non distended.  Extremities: No edema, lower extremities warm to touch, with good strength. Neurologic: PERRL, EOM intact. Skin: No Rash.    Intake/Output from previous day:  Intake/Output Summary (Last 24 hours) at 12/22/13 1452 Last data filed at 12/22/13 0610  Gross per 24 hour  Intake 1781.67 ml  Output      0 ml  Net 1781.67 ml   LAB RESULTS: CBC  Recent Labs Lab 12/15/13 1625 12/16/13 0540 12/17/13 0556 12/20/13 0450 12/21/13 0630  WBC 17.5* 10.7* 5.7 6.3 9.7  HGB 12.0 9.3* 9.7* 10.4* 12.4  HCT 33.7* 26.8* 27.9* 29.6* 35.9*  PLT 177 138* 167 332 438*  MCV 111.2* 113.6* 112.0* 110.0* 112.2*  MCH 39.6* 39.4* 39.0* 38.7* 38.8*  MCHC 35.6 34.7 34.8 35.1 34.5  RDW 14.7 15.7* 15.6* 15.1 14.9  LYMPHSABS 0.9  --  0.9  --   --   MONOABS 0.9  --  0.5  --   --   EOSABS 0.2  --  0.3  --   --   BASOSABS 0.0  --  0.0  --   --    Chemistries   Recent Labs Lab 12/18/13 0625 12/19/13 0550 12/20/13 0450 12/21/13 0630 12/22/13 0420  NA 140 138 138 135* 138  K 3.4* 3.7 4.3 4.8 4.5  CL 106 104 106 100 102  CO2 21 20 20 22 21   GLUCOSE 91 84 87 94 85  BUN <3* <3* <3* <3* <3*  CREATININE 0.50  0.49* 0.51 0.48* 0.56  CALCIUM 7.8* 8.0* 8.2* 9.4 9.1  MG  --  1.4*  --   --   --     GFR Estimated Creatinine Clearance: 69.9 ml/min (by C-G formula based on Cr of 0.56).  Coagulation profile  Recent Labs Lab 12/16/13 0540  INR 1.23   No results found for this basename: VITAMINB12, FOLATE, FERRITIN, TIBC, IRON, RETICCTPCT,  in the last 72 hours MICROBIOLOGY: Recent Results (from the past 240 hour(s))  CLOSTRIDIUM DIFFICILE BY PCR     Status: Abnormal   Collection Time    12/15/13 11:55 PM      Result Value Ref Range Status   C difficile by pcr POSITIVE (*) NEGATIVE  Final   Comment: CRITICAL RESULT CALLED TO, READ BACK BY AND VERIFIED WITH:     C. DRIGGERS RN 10:55 12/15/13 ([email or web address])  STOOL CULTURE     Status: None   Collection Time    12/16/13  2:20 AM      Result Value Ref Range Status   Specimen Description STOOL   Final   Special Requests NONE   Final   Culture     Final   Value: NO SALMONELLA, SHIGELLA, CAMPYLOBACTER, YERSINIA, OR E.COLI 0157:H7 ISOLATED     Performed at [facility]   Report Status 12/20/2013 FINAL   Final    RADIOLOGY STUDIES/RESULTS: Ct Abdomen Pelvis Wo Contrast  12/15/2013   CLINICAL DATA:  Lower abdominal pain, nausea and vomiting, with diarrhea, and with hematuria.  EXAM: CT ABDOMEN AND PELVIS WITHOUT CONTRAST  TECHNIQUE: Multidetector CT imaging of the abdomen and pelvis was performed following the standard protocol without intravenous contrast.  COMPARISON:  US ABDOMEN COMPLETE dated 12/12/2013; CT ABD/PELVIS W CM dated 11/11/2013; CT ABD/PELVIS W CM dated 07/25/2013  FINDINGS: Marked hepatic steatosis. Slight hepatomegaly. Normal spleen. Mild gallbladder distention without visible stones. No biliary ductal dilatation. Normal-appearing pancreas and adrenal glands. Lung apices clear.  Prominent left-sided renal scarring is noted with slight left renal atrophy; the right kidney is hypertrophied. No hydronephrosis is seen on the right or left.  There are no renal or ureteral stones. There is no perinephric stranding to suggest previous stone passage.  The appendix is normal. The colon appears unusually smooth, particularly in the descending and sigmoid segments, compared with October 2014 with fluid-filled throughout, mild wall thickening, but no obstruction and no pericolonic inflammation. Considerations would include inflammatory bowel disease, autoimmune colitis, pseudomembranous colitis, or infectious colitis. Compared with 11/11/2013, the findings have also progressed.  Physiologic slightly greater than 2 cm left adnexal cyst may be slightly smaller. No free fluid no retroperitoneal, mesenteric, or inguinal adenopathy.  IMPRESSION: Nonspecific fluid-filled colon with mild loss of haustral markings, mild wall thickening, but no obstruction or pericolonic inflammation. Nonspecific findings of colitis are suggested.  Left renal scarring is stable.  No renal obstruction or calculi.  Marked hepatic steatosis with slight hepatomegaly also stable.   Electronically Signed   By: Rolla Flatten M.D.   On: 12/15/2013 17:50   US Abdomen Complete  12/12/2013   CLINICAL DATA:  Right upper quadrant pain  EXAM: ULTRASOUND ABDOMEN COMPLETE  COMPARISON:  CT 11/11/2013  FINDINGS: Gallbladder:  No gallstones or wall thickening visualized. No sonographic Murphy sign noted.  Common bile duct:  Diameter: Normal at 2.1 mm  Liver:  Diffusely increased in echogenicity.  No duct dilatation.  IVC:  No abnormality visualized.  Pancreas:  Visualized portion unremarkable.  Spleen:  Size and appearance within normal limits.  Right Kidney:  Length: 11.1 cm. Echogenicity within normal limits. No mass or hydronephrosis visualized.  Left Kidney:  Length: 8.5 cm . Echogenicity within normal limits. No mass or hydronephrosis visualized. Cortical scarring in the upper pole.  Abdominal aorta:  No aneurysm visualized.  Other findings:  No free fluid.  IMPRESSION: 1. Increased liver  echogenicity most consistent with hepatic steatosis. 2. Normal gallbladder.   Electronically Signed   By: Suzy Bouchard M.D.   On: 12/12/2013 18:07   Ruthine Dose [facility] Pager:[phone]  If 7PM-7AM, please contact night-coverage [email or web address] Password [email or web address] 12/22/2013, 2:52 PM   LOS: 5 days

## 2013-12-22 NOTE — Progress Notes (Signed)
Patient ID: Jamie Allen, female   DOB: 03/24/1971, 43 y.o.   MRN: [id number] [facility] Progress Note  Jamie Allen 43 y.o. [birth year]   Subjective: Stools less frequent and pasty. No vomiting this morning. Feels somewhat better and wants to eat.  Objective: Vital signs in last 24 hours: Filed Vitals:   12/22/13 0537  BP: 119/89  Pulse: 85  Temp: 98.9 F (37.2 C)  Resp: 18    Physical Exam: Gen: alert, no acute distress Abd: minimal epigastric tenderness with guarding, otherwise nontender, soft, nondistended, +BS  Lab Results:  Recent Labs  12/21/13 0630 12/22/13 0420  NA 135* 138  K 4.8 4.5  CL 100 102  CO2 22 21  GLUCOSE 94 85  BUN <3* <3*  CREATININE 0.48* 0.56  CALCIUM 9.4 9.1   No results found for this basename: AST, ALT, ALKPHOS, BILITOT, PROT, ALBUMIN,  in the last 72 hours  Recent Labs  12/20/13 0450 12/21/13 0630  WBC 6.3 9.7  HGB 10.4* 12.4  HCT 29.6* 35.9*  MCV 110.0* 112.2*  PLT 332 438*   No results found for this basename: LABPROT, INR,  in the last 72 hours    Assessment/Plan: 43 yo with C. Diff colitis that seems to be improving. N/V yesterday has resolved and will restart diet challenge. Hold off on inpt colonoscopy since diarrhea is resolving. Supportive care. If tolerates diet, then ok to go home tomorrow from GI standpoint and f/u as outpt.   [place] C. 12/22/2013, 8:48 AM

## 2013-12-22 NOTE — Progress Notes (Signed)
Pt states that she has not been able to to sleep well for the past view nights. Asked if RN could contact provider to see if she could have anything for tonight. Provider on call for [facility] was paged. Rogue Bussing placed an order for Ambien. Ambien administed to patient . Will continue to monitor.  Addaleigh Nicholls J. Conley Canal RN

## 2013-12-23 MED ORDER — SACCHAROMYCES BOULARDII 250 MG PO CAPS: 250.0000 mg | ORAL_CAPSULE | Freq: Two times a day (BID) | ORAL | Status: DC

## 2013-12-23 MED ORDER — CILIDINIUM-CHLORDIAZEPOXIDE 2.5-5 MG PO CAPS: 1.0000 | ORAL_CAPSULE | Freq: Three times a day (TID) | ORAL | Status: DC | PRN

## 2013-12-23 MED ORDER — ONDANSETRON HCL 4 MG PO TABS: 4.0000 mg | ORAL_TABLET | Freq: Four times a day (QID) | ORAL | Status: DC | PRN

## 2013-12-23 MED ORDER — VANCOMYCIN HCL 250 MG PO CAPS: 250.0000 mg | ORAL_CAPSULE | Freq: Four times a day (QID) | ORAL | Status: DC

## 2013-12-23 NOTE — Progress Notes (Signed)
Nsg Discharge Note  Admit Date:  12/15/2013 Discharge date: 12/23/2013   Jamie Allen to be D/C'd Home per MD order.  AVS completed.  Copy for chart, and copy for patient signed, and dated. Patient/caregiver able to verbalize understanding.  Discharge Medication:   Medication List    STOP taking these medications       cephALEXin 500 MG capsule  Commonly known as:  KEFLEX     cholestyramine 4 G packet  Commonly known as:  QUESTRAN     NEXIUM PO     ondansetron 4 MG disintegrating tablet  Commonly known as:  ZOFRAN-ODT     potassium chloride SA 20 MEQ tablet  Commonly known as:  K-DUR,KLOR-CON     senna-docusate 8.6-50 MG per tablet  Commonly known as:  Senokot-S      TAKE these medications       clidinium-chlordiazePOXIDE 5-2.5 MG per capsule  Commonly known as:  LIBRAX  Take 1 capsule by mouth 3 (three) times daily as needed. Take before meals only if needed for stomach spasm.     diphenhydrAMINE 25 mg capsule  Commonly known as:  BENADRYL  Take 25 mg by mouth daily as needed for allergies.     nicotine 21 mg/24hr patch  Commonly known as:  NICODERM CQ - dosed in mg/24 hours  Place 21 mg onto the skin daily.     ondansetron 4 MG tablet  Commonly known as:  ZOFRAN  Take 1 tablet (4 mg total) by mouth every 6 (six) hours as needed for nausea.     oxyCODONE 5 MG immediate release tablet  Commonly known as:  Oxy IR/ROXICODONE  Take 5 mg by mouth every 6 (six) hours as needed for severe pain.     saccharomyces boulardii 250 MG capsule  Commonly known as:  FLORASTOR  Take 1 capsule (250 mg total) by mouth 2 (two) times daily.     vancomycin 250 MG capsule  Commonly known as:  VANCOCIN  Take 1 capsule (250 mg total) by mouth 4 (four) times daily.        Discharge Assessment: Filed Vitals:   12/23/13 0500  BP: 95/66  Pulse: 94  Temp: 98.5 F (36.9 C)  Resp: 15   Skin clean, dry and intact without evidence of skin break down, no evidence of skin  tears noted. IV catheter discontinued intact. Site without signs and symptoms of complications - no redness or edema noted at insertion site, patient denies c/o pain - only slight tenderness at site.  Dressing with slight pressure applied.  D/c Instructions-Education: Discharge instructions given to patient/family with verbalized understanding. D/c education completed with patient/family including follow up instructions, medication list, d/c activities limitations if indicated, with other d/c instructions as indicated by MD - patient able to verbalize understanding, all questions fully answered. Patient instructed to return to ED, call 911, or call MD for any changes in condition.  Patient escorted via [place], and D/C home via private auto.  Dayle Points, RN 12/23/2013 1:50 PM

## 2013-12-23 NOTE — Discharge Instructions (Signed)
·   It is important that you take your Vancomycin as prescribed.   Nexium has been discontinued as it can worsen C-diff diarrhea  Librax should be taken only when needed before meals.  Use Sparingly

## 2013-12-23 NOTE — Progress Notes (Signed)
Physician Discharge Summary  Jamie Allen BJY:[id number] DOB: Mar 21, 1971 DOA: 12/15/2013  PCP: Angelica Chessman, MD  Admit date: 12/15/2013 Discharge date: 12/23/2013  Time spent: 45 minutes  Recommendations for Outpatient Follow-up:  Follow up with PCP for BMET and hospital follow up Follow up with Dr. Michail Sermon in 3 weeks regarding diarrhea and vomiting.  Determine how to taper the vancomycin.  Discharge Diagnoses:  Principal Problem:   Colitis Active Problems:   Nausea with vomiting   NASH (nonalcoholic steatohepatitis)   Discharge Condition: stable.  Tolerates full liquids.  Still vomits solids.  Diet recommendation: full liquids  Filed Weights   12/20/13 0523 12/21/13 0500 12/22/13 0537  Weight: 49.7 kg (109 lb 9.1 oz) 49.7 kg (109 lb 9.1 oz) 48.8 kg (107 lb 9.4 oz)    History of present illness:  the patient is a 15 female with a PMH significant for Nash and crocodile pain. She presented on February 27 with abdominal pain and nausea vomiting and diarrhea. History gathered after admission revealed that she'd been having vomiting and diarrhea for 8 months.  Upper endoscopy done in December of 2014 was normal, and the patient was scheduled for an outpatient colonoscopy on March 5. Her CT scan and emergency department show: thickening without inflammation. She had recently taken keflex for a urinary tract infection.  Hospital Course:  C Diff Colitis  -C. difficile PCR positive. -The patient was initially treated with Flagyl but continued to have 30+ small watery stools daily - her antibiotics for escalated to include oral vanc, but her diarrhea still did not improve.  [facility] was consult in -Flexible sigmoidoscopy on 2/28 showed no abnormalities; Bx results were negative for IBD. -Prior work up-HIV/Hep Serology/Celiac panel-Negative.  -GI pathogen panel negative.  -Finally the patient began to show improvement after vancomycin dose was increased to 250  Q. ID and she was started on probiotics. -She is now decreased to approximately 3 pasty stools in the last 12 hours. She will be discharged on vancomycin 254 times a day for 3 weeks. -She is to follow-up with Dr. Michail Sermon in the office and determine a vancomycin taper -Dr. Michail Sermon was still considering colonoscopy to rule out IBD  Nausea with vomiting  -Upper endoscopy was done in 09/2013 at [facility] = Normal.  -Gastric emptying study normal.  -Abdominal xray 3/4 normal.  -With the addition of IV Ativan the patient was able to tolerate full liquids  -IV Ativan was changed to oral Librax prior to eating  -at discharge the patient is able to tolerate full liquids.  NASH  -Patient with elevated LFTs, further workup to be done in the outpatient setting.  -Prior work up done in the outpatient setting-shows negative alpha one anti-trypsin, ceruloplasmin,neg hep serology,  -however Anti smooth muscle antibody was positive.   Recent history of UTI  -Currently symptoms are from colitis, no indication for treatment of UTI.  -Continue to monitor off antibiotics.   Hypokalemia  -This is likely secondary to diarrhea.  -resolved with supplementation. Now stable.   Macrocytic anemia  -Vitamin B12 levels within normal limits  -Folate pending  -? Secondary to alcohol use.  GGT is significantly elevated.   Consultations:  [facility].  Discharge Exam: Filed Vitals:   12/23/13 0500  BP: 95/66  Pulse: 94  Temp: 98.5 F (36.9 C)  Resp: 15   Gen Exam: Awake and alert and oriented with clear speech.  Neck: Supple, No JVD or masses.  Chest: Clear to ascultation bilaterally. No rhonchi  or wheeze.  CVS: S1 S2 Regular, no murmurs.  Abdomen: Soft, BS +, non tender, non distended, no masses. Extremities: No edema, lower extremities warm to touch, with good strength.  Neurologic: PERRL, EOM intact.  Skin: No Rash.  Psych:  Cooperative, Well groomed.  Appears anxious.   Discharge  Instructions      Discharge Orders   Future Appointments Provider Department Dept Phone   01/16/2014 11:30 AM Lorayne Marek, MD [place] [phone]   Future Orders Complete By Expires   Diet - low sodium heart healthy  As directed    Increase activity slowly  As directed        Medication List    STOP taking these medications       cephALEXin 500 MG capsule  Commonly known as:  KEFLEX     cholestyramine 4 G packet  Commonly known as:  QUESTRAN     NEXIUM PO     ondansetron 4 MG disintegrating tablet  Commonly known as:  ZOFRAN-ODT     potassium chloride SA 20 MEQ tablet  Commonly known as:  K-DUR,KLOR-CON     senna-docusate 8.6-50 MG per tablet  Commonly known as:  Senokot-S      TAKE these medications       clidinium-chlordiazePOXIDE 5-2.5 MG per capsule  Commonly known as:  LIBRAX  Take 1 capsule by mouth 3 (three) times daily as needed. Take before meals only if needed for stomach spasm.     diphenhydrAMINE 25 mg capsule  Commonly known as:  BENADRYL  Take 25 mg by mouth daily as needed for allergies.     nicotine 21 mg/24hr patch  Commonly known as:  NICODERM CQ - dosed in mg/24 hours  Place 21 mg onto the skin daily.     ondansetron 4 MG tablet  Commonly known as:  ZOFRAN  Take 1 tablet (4 mg total) by mouth every 6 (six) hours as needed for nausea.     oxyCODONE 5 MG immediate release tablet  Commonly known as:  Oxy IR/ROXICODONE  Take 5 mg by mouth every 6 (six) hours as needed for severe pain.     saccharomyces boulardii 250 MG capsule  Commonly known as:  FLORASTOR  Take 1 capsule (250 mg total) by mouth 2 (two) times daily.     vancomycin 250 MG capsule  Commonly known as:  VANCOCIN  Take 1 capsule (250 mg total) by mouth 4 (four) times daily.       Allergies  Allergen Reactions  . Iodine Swelling  . Morphine And Related Other (See Comments)    unknown   Follow-up Information   Follow up with  SCHOOLER,VINCENT C., MD. Schedule an appointment as soon as possible for a visit in 3 weeks. (Please see Dr. Michail Sermon in 3 weeks to determine how to taper your Vancomycin.)    Specialty:  Gastroenterology   Contact information:   [address] [place] [address] [phone]        The results of significant diagnostics from this hospitalization (including imaging, microbiology, ancillary and laboratory) are listed below for reference.    Significant Diagnostic Studies: Ct Abdomen Pelvis Wo Contrast  12/15/2013   CLINICAL DATA:  Lower abdominal pain, nausea and vomiting, with diarrhea, and with hematuria.  EXAM: CT ABDOMEN AND PELVIS WITHOUT CONTRAST  TECHNIQUE: Multidetector CT imaging of the abdomen and pelvis was performed following the standard protocol without intravenous contrast.  COMPARISON:  US  ABDOMEN COMPLETE dated 12/12/2013; CT ABD/PELVIS W CM dated 11/11/2013; CT ABD/PELVIS W CM dated 07/25/2013  FINDINGS: Marked hepatic steatosis. Slight hepatomegaly. Normal spleen. Mild gallbladder distention without visible stones. No biliary ductal dilatation. Normal-appearing pancreas and adrenal glands. Lung apices clear.  Prominent left-sided renal scarring is noted with slight left renal atrophy; the right kidney is hypertrophied. No hydronephrosis is seen on the right or left. There are no renal or ureteral stones. There is no perinephric stranding to suggest previous stone passage.  The appendix is normal. The colon appears unusually smooth, particularly in the descending and sigmoid segments, compared with October 2014 with fluid-filled throughout, mild wall thickening, but no obstruction and no pericolonic inflammation. Considerations would include inflammatory bowel disease, autoimmune colitis, pseudomembranous colitis, or infectious colitis. Compared with 11/11/2013, the findings have also progressed.  Physiologic slightly greater than 2 cm left adnexal cyst may be slightly  smaller. No free fluid no retroperitoneal, mesenteric, or inguinal adenopathy.  IMPRESSION: Nonspecific fluid-filled colon with mild loss of haustral markings, mild wall thickening, but no obstruction or pericolonic inflammation. Nonspecific findings of colitis are suggested.  Left renal scarring is stable.  No renal obstruction or calculi.  Marked hepatic steatosis with slight hepatomegaly also stable.   Electronically Signed   By: Rolla Flatten M.D.   On: 12/15/2013 17:50   Nm Gastric Emptying  12/20/2013   CLINICAL DATA:  Bloating and early satiety. Gastric paresis. Abdominal pain. Nausea and vomiting.  EXAM: NUCLEAR MEDICINE GASTRIC EMPTYING SCAN  TECHNIQUE: After oral ingestion of radiolabeled meal, sequential abdominal images were obtained for 120 minutes. Residual percentage of activity remaining within the stomach was calculated at 120 minutes.  COMPARISON:  CT ABD/PELV WO CM dated 12/15/2013  RADIOPHARMACEUTICALS:  2 mCiTechnetium 99-m labeled sulfur colloid  FINDINGS: Expected location of the stomach in the left upper quadrant. Ingested meal empties the stomach gradually over the course of the study with 26% retention at 120 min (normal retention less than 30% at a 120 min).  IMPRESSION: Normal gastric emptying study.   Electronically Signed   By: Dereck Ligas M.D.   On: 12/20/2013 14:24   US Abdomen Complete  12/12/2013   CLINICAL DATA:  Right upper quadrant pain  EXAM: ULTRASOUND ABDOMEN COMPLETE  COMPARISON:  CT 11/11/2013  FINDINGS: Gallbladder:  No gallstones or wall thickening visualized. No sonographic Murphy sign noted.  Common bile duct:  Diameter: Normal at 2.1 mm  Liver:  Diffusely increased in echogenicity.  No duct dilatation.  IVC:  No abnormality visualized.  Pancreas:  Visualized portion unremarkable.  Spleen:  Size and appearance within normal limits.  Right Kidney:  Length: 11.1 cm. Echogenicity within normal limits. No mass or hydronephrosis visualized.  Left Kidney:  Length: 8.5  cm . Echogenicity within normal limits. No mass or hydronephrosis visualized. Cortical scarring in the upper pole.  Abdominal aorta:  No aneurysm visualized.  Other findings:  No free fluid.  IMPRESSION: 1. Increased liver echogenicity most consistent with hepatic steatosis. 2. Normal gallbladder.   Electronically Signed   By: Suzy Bouchard M.D.   On: 12/12/2013 18:07   Dg Abd 2 Views  12/21/2013   CLINICAL DATA:  Right lower quadrant pain. Nausea, vomiting, diarrhea.  EXAM: ABDOMEN - 2 VIEW  COMPARISON:  NM GASTRIC EMPTYING dated 12/20/2013; CT ABD/PELV WO CM dated 12/15/2013; CT ABD/PELVIS W CM dated 11/11/2013  FINDINGS: Gaseous distension of colon. The bowel gas pattern is nonobstructive. There is no plain film evidence of free air.  There does appear to be an air-fluid level within the ascending colon. Fluid levels were present in the colon on prior examinations as well. No evidence of obstruction. Small amount of gas is present in the region of rectosigmoid.  IMPRESSION: Colonic fluid levels are abnormal but nonspecific. No obstruction or acute abnormality.   Electronically Signed   By: Dereck Ligas M.D.   On: 12/21/2013 18:12    Microbiology: Recent Results (from the past 240 hour(s))  CLOSTRIDIUM DIFFICILE BY PCR     Status: Abnormal   Collection Time    12/15/13 11:55 PM      Result Value Ref Range Status   C difficile by pcr POSITIVE (*) NEGATIVE Final   Comment: CRITICAL RESULT CALLED TO, READ BACK BY AND VERIFIED WITH:     C. DRIGGERS RN 10:55 12/15/13 ([email or web address])  STOOL CULTURE     Status: None   Collection Time    12/16/13  2:20 AM      Result Value Ref Range Status   Specimen Description STOOL   Final   Special Requests NONE   Final   Culture     Final   Value: NO SALMONELLA, SHIGELLA, CAMPYLOBACTER, YERSINIA, OR E.COLI 0157:H7 ISOLATED     Performed at [facility]   Report Status 12/20/2013 FINAL   Final     Labs: Basic Metabolic Panel:  Recent Labs Lab  12/18/13 0625 12/19/13 0550 12/20/13 0450 12/21/13 0630 12/22/13 0420  NA 140 138 138 135* 138  K 3.4* 3.7 4.3 4.8 4.5  CL 106 104 106 100 102  CO2 21 20 20 22 21   GLUCOSE 91 84 87 94 85  BUN <3* <3* <3* <3* <3*  CREATININE 0.50 0.49* 0.51 0.48* 0.56  CALCIUM 7.8* 8.0* 8.2* 9.4 9.1  MG  --  1.4*  --   --   --    Liver Function Tests:  Recent Labs Lab 12/17/13 0556  AST 128*  ALT 57*  ALKPHOS 178*  BILITOT 0.8  PROT 5.7*  ALBUMIN 2.4*   CBC:  Recent Labs Lab 12/17/13 0556 12/20/13 0450 12/21/13 0630  WBC 5.7 6.3 9.7  NEUTROABS 4.0  --   --   HGB 9.7* 10.4* 12.4  HCT 27.9* 29.6* 35.9*  MCV 112.0* 110.0* 112.2*  PLT 167 332 438*     Signed:  Karen Kitchens [phone]  [facility] 12/23/2013, 2:54 PM    Addendum  Patient seen and examined, chart and data base reviewed.  I agree with the above assessment and plan.  For full details please see Mrs. Imogene Burn PA note.  Patient had prolonged hospital stay secondary to nausea/vomiting and C. difficile colitis.  Discharge on oral vancomycin, to followup with Dr. Michail Sermon.   Birdie Hopes, MD [facility] Pager: [phone] 12/24/2013, 12:53 PM

## 2013-12-23 NOTE — Progress Notes (Addendum)
Patient ID: Jamie Allen, female   DOB: [birth year], 43 y.o.   MRN: [id number] [facility] Progress Note  Jamie Allen 43 y.o. 27-Mar-1971   Subjective: Feels better. Tolerating liquid diet. Reports 3 mushy stools. No abdominal pain  Objective: Vital signs: Filed Vitals:   12/23/13 0500  BP: 95/66  Pulse: 94  Temp: 98.5 F (36.9 C)  Resp: 15    Physical Exam: Gen: alert, no acute distress  Abd: soft, nontender, nondistended  Lab Results:  Recent Labs  12/21/13 0630 12/22/13 0420  NA 135* 138  K 4.8 4.5  CL 100 102  CO2 22 21  GLUCOSE 94 85  BUN <3* <3*  CREATININE 0.48* 0.56  CALCIUM 9.4 9.1   No results found for this basename: AST, ALT, ALKPHOS, BILITOT, PROT, ALBUMIN,  in the last 72 hours  Recent Labs  12/21/13 0630  WBC 9.7  HGB 12.4  HCT 35.9*  MCV 112.2*  PLT 438*      Assessment/Plan: 43 yo with C. Diff colitis that is resolving. Stable to go home today from GI standpoint if can tolerate solid food. Needs to stay on Vancomycin 250 mg PO QID until f/u with [name] and then will slowly taper. Continue Florastor BID. Needs f/u with [name] in 3 weeks. Will decide at that time on timing of colonoscopy. Will sign off. Call if questions.   [place] C. 12/23/2013, 7:50 AM

## 2013-12-24 NOTE — Discharge Summary (Signed)
Physician Discharge Summary  Jamie Allen JJO:[id number] DOB: 1971-06-17 DOA: 12/15/2013  PCP: Angelica Chessman, MD  Admit date: 12/15/2013 Discharge date: 12/24/2013  Time spent: 45 minutes  Recommendations for Outpatient Follow-up:  Follow up with PCP for BMET and hospital follow up Follow up with Dr. Michail Sermon in 3 weeks regarding diarrhea and vomiting.  Determine how to taper the vancomycin.  Discharge Diagnoses:  Principal Problem:   Colitis Active Problems:   Nausea with vomiting   NASH (nonalcoholic steatohepatitis)   Discharge Condition: stable.  Tolerates full liquids.  Still vomits solids.  Diet recommendation: full liquids  Filed Weights   12/20/13 0523 12/21/13 0500 12/22/13 0537  Weight: 49.7 kg (109 lb 9.1 oz) 49.7 kg (109 lb 9.1 oz) 48.8 kg (107 lb 9.4 oz)    History of present illness:  the patient is a 24 female with a PMH significant for Nash and crocodile pain. She presented on February 27 with abdominal pain and nausea vomiting and diarrhea. History gathered after admission revealed that she'd been having vomiting and diarrhea for 8 months.  Upper endoscopy done in December of 2014 was normal, and the patient was scheduled for an outpatient colonoscopy on March 5. Her CT scan and emergency department show: thickening without inflammation. She had recently taken keflex for a urinary tract infection.  Hospital Course:  C Diff Colitis  -C. difficile PCR positive. -The patient was initially treated with Flagyl but continued to have 30+ small watery stools daily - her antibiotics for escalated to include oral vanc, but her diarrhea still did not improve.  [facility] was consult in -Flexible sigmoidoscopy on 2/28 showed no abnormalities; Bx results were negative for IBD. -Prior work up-HIV/Hep Serology/Celiac panel-Negative.  -GI pathogen panel negative.  -Finally the patient began to show improvement after vancomycin dose was increased to 250  Q. ID and she was started on probiotics. -She is now decreased to approximately 3 pasty stools in the last 12 hours. She will be discharged on vancomycin 254 times a day for 3 weeks. -She is to follow-up with Dr. Michail Sermon in the office and determine a vancomycin taper -Dr. Michail Sermon was still considering colonoscopy to rule out IBD  Nausea with vomiting  -Upper endoscopy was done in 09/2013 at [facility] = Normal.  -Gastric emptying study normal.  -Abdominal xray 3/4 normal.  -With the addition of IV Ativan the patient was able to tolerate full liquids  -IV Ativan was changed to oral Librax prior to eating  -at discharge the patient is able to tolerate full liquids.  NASH  -Patient with elevated LFTs, further workup to be done in the outpatient setting.  -Prior work up done in the outpatient setting-shows negative alpha one anti-trypsin, ceruloplasmin,neg hep serology,  -however Anti smooth muscle antibody was positive.   Recent history of UTI  -Currently symptoms are from colitis, no indication for treatment of UTI.  -Continue to monitor off antibiotics.   Hypokalemia  -This is likely secondary to diarrhea.  -resolved with supplementation. Now stable.   Macrocytic anemia  -Vitamin B12 levels within normal limits  -Folate pending  -? Secondary to alcohol use.  GGT is significantly elevated.   Consultations:  [facility].  Discharge Exam: Filed Vitals:   12/23/13 0500  BP: 95/66  Pulse: 94  Temp: 98.5 F (36.9 C)  Resp: 15   Gen Exam: Awake and alert and oriented with clear speech.  Neck: Supple, No JVD or masses.  Chest: Clear to ascultation bilaterally. No rhonchi  or wheeze.  CVS: S1 S2 Regular, no murmurs.  Abdomen: Soft, BS +, non tender, non distended, no masses. Extremities: No edema, lower extremities warm to touch, with good strength.  Neurologic: PERRL, EOM intact.  Skin: No Rash.  Psych:  Cooperative, Well groomed.  Appears anxious.   Discharge  Instructions      Discharge Orders   Future Appointments Provider Department Dept Phone   01/16/2014 11:30 AM Lorayne Marek, MD [place] [phone]   Future Orders Complete By Expires   Diet - low sodium heart healthy  As directed    Increase activity slowly  As directed        Medication List    STOP taking these medications       cephALEXin 500 MG capsule  Commonly known as:  KEFLEX     cholestyramine 4 G packet  Commonly known as:  QUESTRAN     NEXIUM PO     ondansetron 4 MG disintegrating tablet  Commonly known as:  ZOFRAN-ODT     potassium chloride SA 20 MEQ tablet  Commonly known as:  K-DUR,KLOR-CON     senna-docusate 8.6-50 MG per tablet  Commonly known as:  Senokot-S      TAKE these medications       clidinium-chlordiazePOXIDE 5-2.5 MG per capsule  Commonly known as:  LIBRAX  Take 1 capsule by mouth 3 (three) times daily as needed. Take before meals only if needed for stomach spasm.     diphenhydrAMINE 25 mg capsule  Commonly known as:  BENADRYL  Take 25 mg by mouth daily as needed for allergies.     nicotine 21 mg/24hr patch  Commonly known as:  NICODERM CQ - dosed in mg/24 hours  Place 21 mg onto the skin daily.     ondansetron 4 MG tablet  Commonly known as:  ZOFRAN  Take 1 tablet (4 mg total) by mouth every 6 (six) hours as needed for nausea.     oxyCODONE 5 MG immediate release tablet  Commonly known as:  Oxy IR/ROXICODONE  Take 5 mg by mouth every 6 (six) hours as needed for severe pain.     saccharomyces boulardii 250 MG capsule  Commonly known as:  FLORASTOR  Take 1 capsule (250 mg total) by mouth 2 (two) times daily.     vancomycin 250 MG capsule  Commonly known as:  VANCOCIN  Take 1 capsule (250 mg total) by mouth 4 (four) times daily.       Allergies  Allergen Reactions  . Iodine Swelling  . Morphine And Related Other (See Comments)    unknown   Follow-up Information   Follow up with  SCHOOLER,VINCENT C., MD. Schedule an appointment as soon as possible for a visit in 3 weeks. (Please see Dr. Michail Sermon in 3 weeks to determine how to taper your Vancomycin.)    Specialty:  Gastroenterology   Contact information:   [address], [place] [address] [phone]        The results of significant diagnostics from this hospitalization (including imaging, microbiology, ancillary and laboratory) are listed below for reference.    Significant Diagnostic Studies: Ct Abdomen Pelvis Wo Contrast  12/15/2013   CLINICAL DATA:  Lower abdominal pain, nausea and vomiting, with diarrhea, and with hematuria.  EXAM: CT ABDOMEN AND PELVIS WITHOUT CONTRAST  TECHNIQUE: Multidetector CT imaging of the abdomen and pelvis was performed following the standard protocol without intravenous contrast.  COMPARISON:  US  ABDOMEN COMPLETE dated 12/12/2013; CT ABD/PELVIS W CM dated 11/11/2013; CT ABD/PELVIS W CM dated 07/25/2013  FINDINGS: Marked hepatic steatosis. Slight hepatomegaly. Normal spleen. Mild gallbladder distention without visible stones. No biliary ductal dilatation. Normal-appearing pancreas and adrenal glands. Lung apices clear.  Prominent left-sided renal scarring is noted with slight left renal atrophy; the right kidney is hypertrophied. No hydronephrosis is seen on the right or left. There are no renal or ureteral stones. There is no perinephric stranding to suggest previous stone passage.  The appendix is normal. The colon appears unusually smooth, particularly in the descending and sigmoid segments, compared with October 2014 with fluid-filled throughout, mild wall thickening, but no obstruction and no pericolonic inflammation. Considerations would include inflammatory bowel disease, autoimmune colitis, pseudomembranous colitis, or infectious colitis. Compared with 11/11/2013, the findings have also progressed.  Physiologic slightly greater than 2 cm left adnexal cyst may be slightly  smaller. No free fluid no retroperitoneal, mesenteric, or inguinal adenopathy.  IMPRESSION: Nonspecific fluid-filled colon with mild loss of haustral markings, mild wall thickening, but no obstruction or pericolonic inflammation. Nonspecific findings of colitis are suggested.  Left renal scarring is stable.  No renal obstruction or calculi.  Marked hepatic steatosis with slight hepatomegaly also stable.   Electronically Signed   By: Rolla Flatten M.D.   On: 12/15/2013 17:50   Nm Gastric Emptying  12/20/2013   CLINICAL DATA:  Bloating and early satiety. Gastric paresis. Abdominal pain. Nausea and vomiting.  EXAM: NUCLEAR MEDICINE GASTRIC EMPTYING SCAN  TECHNIQUE: After oral ingestion of radiolabeled meal, sequential abdominal images were obtained for 120 minutes. Residual percentage of activity remaining within the stomach was calculated at 120 minutes.  COMPARISON:  CT ABD/PELV WO CM dated 12/15/2013  RADIOPHARMACEUTICALS:  2 mCiTechnetium 99-m labeled sulfur colloid  FINDINGS: Expected location of the stomach in the left upper quadrant. Ingested meal empties the stomach gradually over the course of the study with 26% retention at 120 min (normal retention less than 30% at a 120 min).  IMPRESSION: Normal gastric emptying study.   Electronically Signed   By: Dereck Ligas M.D.   On: 12/20/2013 14:24   US Abdomen Complete  12/12/2013   CLINICAL DATA:  Right upper quadrant pain  EXAM: ULTRASOUND ABDOMEN COMPLETE  COMPARISON:  CT 11/11/2013  FINDINGS: Gallbladder:  No gallstones or wall thickening visualized. No sonographic Murphy sign noted.  Common bile duct:  Diameter: Normal at 2.1 mm  Liver:  Diffusely increased in echogenicity.  No duct dilatation.  IVC:  No abnormality visualized.  Pancreas:  Visualized portion unremarkable.  Spleen:  Size and appearance within normal limits.  Right Kidney:  Length: 11.1 cm. Echogenicity within normal limits. No mass or hydronephrosis visualized.  Left Kidney:  Length: 8.5  cm . Echogenicity within normal limits. No mass or hydronephrosis visualized. Cortical scarring in the upper pole.  Abdominal aorta:  No aneurysm visualized.  Other findings:  No free fluid.  IMPRESSION: 1. Increased liver echogenicity most consistent with hepatic steatosis. 2. Normal gallbladder.   Electronically Signed   By: Suzy Bouchard M.D.   On: 12/12/2013 18:07   Dg Abd 2 Views  12/21/2013   CLINICAL DATA:  Right lower quadrant pain. Nausea, vomiting, diarrhea.  EXAM: ABDOMEN - 2 VIEW  COMPARISON:  NM GASTRIC EMPTYING dated 12/20/2013; CT ABD/PELV WO CM dated 12/15/2013; CT ABD/PELVIS W CM dated 11/11/2013  FINDINGS: Gaseous distension of colon. The bowel gas pattern is nonobstructive. There is no plain film evidence of free air.  There does appear to be an air-fluid level within the ascending colon. Fluid levels were present in the colon on prior examinations as well. No evidence of obstruction. Small amount of gas is present in the region of rectosigmoid.  IMPRESSION: Colonic fluid levels are abnormal but nonspecific. No obstruction or acute abnormality.   Electronically Signed   By: Dereck Ligas M.D.   On: 12/21/2013 18:12    Microbiology: Recent Results (from the past 240 hour(s))  CLOSTRIDIUM DIFFICILE BY PCR     Status: Abnormal   Collection Time    12/15/13 11:55 PM      Result Value Ref Range Status   C difficile by pcr POSITIVE (*) NEGATIVE Final   Comment: CRITICAL RESULT CALLED TO, READ BACK BY AND VERIFIED WITH:     CWyline Copas RN 10:55 12/15/13 ([email or web address])  STOOL CULTURE     Status: None   Collection Time    12/16/13  2:20 AM      Result Value Ref Range Status   Specimen Description STOOL   Final   Special Requests NONE   Final   Culture     Final   Value: NO SALMONELLA, SHIGELLA, CAMPYLOBACTER, YERSINIA, OR E.COLI 0157:H7 ISOLATED     Performed at [facility]   Report Status 12/20/2013 FINAL   Final     Labs: Basic Metabolic Panel:  Recent Labs Lab  12/18/13 0625 12/19/13 0550 12/20/13 0450 12/21/13 0630 12/22/13 0420  NA 140 138 138 135* 138  K 3.4* 3.7 4.3 4.8 4.5  CL 106 104 106 100 102  CO2 21 20 20 22 21   GLUCOSE 91 84 87 94 85  BUN <3* <3* <3* <3* <3*  CREATININE 0.50 0.49* 0.51 0.48* 0.56  CALCIUM 7.8* 8.0* 8.2* 9.4 9.1  MG  --  1.4*  --   --   --    Liver Function Tests: No results found for this basename: AST, ALT, ALKPHOS, BILITOT, PROT, ALBUMIN,  in the last 168 hours CBC:  Recent Labs Lab 12/20/13 0450 12/21/13 0630  WBC 6.3 9.7  HGB 10.4* 12.4  HCT 29.6* 35.9*  MCV 110.0* 112.2*  PLT 332 438*     SignedRuthine Dose [phone]  [facility] 12/24/2013, 12:54 PM    Addendum  Patient seen and examined, chart and data base reviewed.  I agree with the above assessment and plan.  For full details please see Mrs. Imogene Burn PA note.  Patient had prolonged hospital stay secondary to nausea/vomiting and C. difficile colitis.  Discharge on oral vancomycin, to followup with Dr. Michail Sermon.   Birdie Hopes, MD [facility] Pager: [phone] 12/24/2013, 12:54 PM

## 2014-01-03 ENCOUNTER — Other Ambulatory Visit (HOSPITAL_COMMUNITY): Payer: Self-pay | Admitting: Gastroenterology

## 2014-01-03 ENCOUNTER — Encounter (HOSPITAL_COMMUNITY): Payer: Self-pay | Admitting: *Deleted

## 2014-01-03 DIAGNOSIS — F4310 Post-traumatic stress disorder, unspecified: Secondary | ICD-10-CM | POA: Insufficient documentation

## 2014-01-03 DIAGNOSIS — K529 Noninfective gastroenteritis and colitis, unspecified: Secondary | ICD-10-CM

## 2014-01-03 DIAGNOSIS — R58 Hemorrhage, not elsewhere classified: Secondary | ICD-10-CM | POA: Insufficient documentation

## 2014-01-03 DIAGNOSIS — R109 Unspecified abdominal pain: Secondary | ICD-10-CM

## 2014-01-03 HISTORY — DX: Noninfective gastroenteritis and colitis, unspecified: K52.9

## 2014-01-03 HISTORY — DX: Hemorrhage, not elsewhere classified: R58

## 2014-01-03 HISTORY — DX: Post-traumatic stress disorder, unspecified: F43.10

## 2014-01-03 NOTE — Progress Notes (Addendum)
01-03-14 Endo(Jill), and Clarene Critchley Speagle,infection prevention informed of patient history of C. Difficile 12-15-13 and remains with 20-30 stools daily"Enteric precautions should be followed".Deno Etienne 01-04-14 Spoke with Laurence Compton on 01-03-14, and spoke with Raelene Bott control today. W. Floy Sabina

## 2014-01-04 ENCOUNTER — Encounter (HOSPITAL_COMMUNITY): Payer: Self-pay | Admitting: Pharmacy Technician

## 2014-01-05 ENCOUNTER — Ambulatory Visit (HOSPITAL_COMMUNITY)
Admission: RE | Admit: 2014-01-05 | Discharge: 2014-01-05 | Disposition: A | Discharge status: Home / Self Care | Payer: No Typology Code available for payment source | Source: Ambulatory Visit | Attending: Gastroenterology | Admitting: Gastroenterology

## 2014-01-05 DIAGNOSIS — K7689 Other specified diseases of liver: Secondary | ICD-10-CM | POA: Insufficient documentation

## 2014-01-05 DIAGNOSIS — R1110 Vomiting, unspecified: Secondary | ICD-10-CM | POA: Insufficient documentation

## 2014-01-05 DIAGNOSIS — R197 Diarrhea, unspecified: Secondary | ICD-10-CM | POA: Insufficient documentation

## 2014-01-05 DIAGNOSIS — R109 Unspecified abdominal pain: Secondary | ICD-10-CM | POA: Insufficient documentation

## 2014-01-15 ENCOUNTER — Other Ambulatory Visit: Payer: Self-pay

## 2014-01-15 ENCOUNTER — Emergency Department (HOSPITAL_COMMUNITY): Payer: No Typology Code available for payment source

## 2014-01-15 ENCOUNTER — Encounter (HOSPITAL_COMMUNITY): Payer: Self-pay | Admitting: Emergency Medicine

## 2014-01-15 ENCOUNTER — Emergency Department (HOSPITAL_COMMUNITY)
Admission: EM | Admit: 2014-01-15 | Discharge: 2014-01-15 | Disposition: A | Discharge status: Home / Self Care | Payer: No Typology Code available for payment source | Attending: Emergency Medicine | Admitting: Emergency Medicine

## 2014-01-15 DIAGNOSIS — F17200 Nicotine dependence, unspecified, uncomplicated: Secondary | ICD-10-CM | POA: Insufficient documentation

## 2014-01-15 DIAGNOSIS — E860 Dehydration: Secondary | ICD-10-CM | POA: Insufficient documentation

## 2014-01-15 DIAGNOSIS — R112 Nausea with vomiting, unspecified: Secondary | ICD-10-CM | POA: Insufficient documentation

## 2014-01-15 DIAGNOSIS — K5900 Constipation, unspecified: Secondary | ICD-10-CM | POA: Insufficient documentation

## 2014-01-15 DIAGNOSIS — R5383 Other fatigue: Secondary | ICD-10-CM

## 2014-01-15 DIAGNOSIS — Z3202 Encounter for pregnancy test, result negative: Secondary | ICD-10-CM | POA: Insufficient documentation

## 2014-01-15 DIAGNOSIS — Z862 Personal history of diseases of the blood and blood-forming organs and certain disorders involving the immune mechanism: Secondary | ICD-10-CM | POA: Insufficient documentation

## 2014-01-15 DIAGNOSIS — I10 Essential (primary) hypertension: Secondary | ICD-10-CM | POA: Insufficient documentation

## 2014-01-15 DIAGNOSIS — Z792 Long term (current) use of antibiotics: Secondary | ICD-10-CM | POA: Insufficient documentation

## 2014-01-15 DIAGNOSIS — R000 Tachycardia, unspecified: Secondary | ICD-10-CM | POA: Insufficient documentation

## 2014-01-15 DIAGNOSIS — Z79899 Other long term (current) drug therapy: Secondary | ICD-10-CM | POA: Insufficient documentation

## 2014-01-15 DIAGNOSIS — R6883 Chills (without fever): Secondary | ICD-10-CM | POA: Insufficient documentation

## 2014-01-15 DIAGNOSIS — R630 Anorexia: Secondary | ICD-10-CM | POA: Insufficient documentation

## 2014-01-15 DIAGNOSIS — R5381 Other malaise: Secondary | ICD-10-CM | POA: Insufficient documentation

## 2014-01-15 LAB — COMPREHENSIVE METABOLIC PANEL
ALK PHOS: 152 U/L — AB (ref 39–117)
ALT: 95 U/L — ABNORMAL HIGH (ref 0–35)
AST: 146 U/L — ABNORMAL HIGH (ref 0–37)
Albumin: 4.4 g/dL (ref 3.5–5.2)
BUN: 5 mg/dL — ABNORMAL LOW (ref 6–23)
CO2: 22 mEq/L (ref 19–32)
Calcium: 10.3 mg/dL (ref 8.4–10.5)
Chloride: 97 mEq/L (ref 96–112)
Creatinine, Ser: 0.74 mg/dL (ref 0.50–1.10)
GFR calc non Af Amer: >90 mL/min (ref >90)
GLUCOSE: 137 mg/dL — AB (ref 70–99)
POTASSIUM: 3.6 meq/L — AB (ref 3.7–5.3)
SODIUM: 139 meq/L (ref 137–147)
Total Bilirubin: 1.3 mg/dL — ABNORMAL HIGH (ref 0.3–1.2)
Total Protein: 8.2 g/dL (ref 6.0–8.3)

## 2014-01-15 LAB — CBC WITH DIFFERENTIAL/PLATELET
Basophils Absolute: 0 10*3/uL (ref 0.0–0.1)
Basophils Relative: 0 % (ref 0–1)
Eosinophils Absolute: 0.1 10*3/uL (ref 0.0–0.7)
Eosinophils Relative: 1 % (ref 0–5)
HCT: 39.6 % (ref 36.0–46.0)
Hemoglobin: 13.9 g/dL (ref 12.0–15.0)
LYMPHS ABS: 0.8 10*3/uL (ref 0.7–4.0)
LYMPHS PCT: 10 % — AB (ref 12–46)
MCH: 36.5 pg — ABNORMAL HIGH (ref 26.0–34.0)
MCHC: 35.1 g/dL (ref 30.0–36.0)
MCV: 103.9 fL — ABNORMAL HIGH (ref 78.0–100.0)
Monocytes Absolute: 0.3 10*3/uL (ref 0.1–1.0)
Monocytes Relative: 3 % (ref 3–12)
NEUTROS PCT: 85 % — AB (ref 43–77)
Neutro Abs: 6.4 10*3/uL (ref 1.7–7.7)
PLATELETS: 254 10*3/uL (ref 150–400)
RBC: 3.81 MIL/uL — AB (ref 3.87–5.11)
RDW: 14.7 % (ref 11.5–15.5)
WBC: 7.5 10*3/uL (ref 4.0–10.5)

## 2014-01-15 LAB — URINALYSIS, ROUTINE W REFLEX MICROSCOPIC
Glucose, UA: NEGATIVE mg/dL
HGB URINE DIPSTICK: NEGATIVE
Ketones, ur: >80 mg/dL — AB
Leukocytes, UA: NEGATIVE
NITRITE: NEGATIVE
PROTEIN: NEGATIVE mg/dL
Specific Gravity, Urine: 1.02 (ref 1.005–1.030)
Urobilinogen, UA: 0.2 mg/dL (ref 0.0–1.0)
pH: 7 (ref 5.0–8.0)

## 2014-01-15 LAB — BASIC METABOLIC PANEL
BUN: 5 mg/dL — ABNORMAL LOW (ref 6–23)
CHLORIDE: 102 meq/L (ref 96–112)
CO2: 23 meq/L (ref 19–32)
CREATININE: 0.69 mg/dL (ref 0.50–1.10)
Calcium: 9.1 mg/dL (ref 8.4–10.5)
GFR calc Af Amer: >90 mL/min (ref >90)
GFR calc non Af Amer: >90 mL/min (ref >90)
Glucose, Bld: 107 mg/dL — ABNORMAL HIGH (ref 70–99)
Potassium: 4.1 mEq/L (ref 3.7–5.3)
Sodium: 139 mEq/L (ref 137–147)

## 2014-01-15 LAB — I-STAT CG4 LACTIC ACID, ED
Lactic Acid, Venous: 2.87 mmol/L — ABNORMAL HIGH (ref 0.5–2.2)
Lactic Acid, Venous: 1.06 mmol/L (ref 0.5–2.2)

## 2014-01-15 LAB — LIPASE, BLOOD: Lipase: 66 U/L — ABNORMAL HIGH (ref 11–59)

## 2014-01-15 LAB — POC URINE PREG, ED: Preg Test, Ur: NEGATIVE

## 2014-01-15 MED ORDER — OXYCODONE HCL 5 MG PO TABS: 5.0000 mg | ORAL_TABLET | ORAL | Status: DC | PRN

## 2014-01-15 MED ORDER — OXYCODONE HCL 5 MG PO TABS
10.0000 mg | ORAL_TABLET | Freq: Once | ORAL | Status: AC
  Administered 2014-01-15: 10 mg via ORAL
  Filled 2014-01-15: qty 2

## 2014-01-15 MED ORDER — ONDANSETRON HCL 4 MG PO TABS: 4.0000 mg | ORAL_TABLET | Freq: Four times a day (QID) | ORAL | Status: DC

## 2014-01-15 MED ORDER — HYDROMORPHONE HCL PF 1 MG/ML IJ SOLN
1.0000 mg | Freq: Once | INTRAMUSCULAR | Status: AC
  Administered 2014-01-15: 1 mg via INTRAVENOUS
  Filled 2014-01-15: qty 1

## 2014-01-15 MED ORDER — ONDANSETRON HCL 4 MG/2ML IJ SOLN
4.0000 mg | Freq: Once | INTRAMUSCULAR | Status: AC
  Administered 2014-01-15: 4 mg via INTRAVENOUS
  Filled 2014-01-15: qty 2

## 2014-01-15 MED ORDER — SODIUM CHLORIDE 0.9 % IV BOLUS (SEPSIS)
1000.0000 mL | Freq: Once | INTRAVENOUS | Status: AC
  Administered 2014-01-15: 1000 mL via INTRAVENOUS

## 2014-01-15 NOTE — Discharge Instructions (Signed)
Nausea and Vomiting Followup as scheduled with Dr. Michail Sermon. Return to the ED if you develop new or worsening symptoms. Nausea is a sick feeling that often comes before throwing up (vomiting). Vomiting is a reflex where stomach contents come out of your mouth. Vomiting can cause severe loss of body fluids (dehydration). Children and elderly adults can become dehydrated quickly, especially if they also have diarrhea. Nausea and vomiting are symptoms of a condition or disease. It is important to find the cause of your symptoms. CAUSES   Direct irritation of the stomach lining. This irritation can result from increased acid production (gastroesophageal reflux disease), infection, food poisoning, taking certain medicines (such as nonsteroidal anti-inflammatory drugs), alcohol use, or tobacco use.  Signals from the brain.These signals could be caused by a headache, heat exposure, an inner ear disturbance, increased pressure in the brain from injury, infection, a tumor, or a concussion, pain, emotional stimulus, or metabolic problems.  An obstruction in the gastrointestinal tract (bowel obstruction).  Illnesses such as diabetes, hepatitis, gallbladder problems, appendicitis, kidney problems, cancer, sepsis, atypical symptoms of a heart attack, or eating disorders.  Medical treatments such as chemotherapy and radiation.  Receiving medicine that makes you sleep (general anesthetic) during surgery. DIAGNOSIS Your caregiver may ask for tests to be done if the problems do not improve after a few days. Tests may also be done if symptoms are severe or if the reason for the nausea and vomiting is not clear. Tests may include:  Urine tests.  Blood tests.  Stool tests.  Cultures (to look for evidence of infection).  X-rays or other imaging studies. Test results can help your caregiver make decisions about treatment or the need for additional tests. TREATMENT You need to stay well hydrated. Drink  frequently but in small amounts.You may wish to drink water, sports drinks, clear broth, or eat frozen ice pops or gelatin dessert to help stay hydrated.When you eat, eating slowly may help prevent nausea.There are also some antinausea medicines that may help prevent nausea. HOME CARE INSTRUCTIONS   Take all medicine as directed by your caregiver.  If you do not have an appetite, do not force yourself to eat. However, you must continue to drink fluids.  If you have an appetite, eat a normal diet unless your caregiver tells you differently.  Eat a variety of complex carbohydrates (rice, wheat, potatoes, bread), lean meats, yogurt, fruits, and vegetables.  Avoid high-fat foods because they are more difficult to digest.  Drink enough water and fluids to keep your urine clear or pale yellow.  If you are dehydrated, ask your caregiver for specific rehydration instructions. Signs of dehydration may include:  Severe thirst.  Dry lips and mouth.  Dizziness.  Dark urine.  Decreasing urine frequency and amount.  Confusion.  Rapid breathing or pulse. SEEK IMMEDIATE MEDICAL CARE IF:   You have blood or brown flecks (like coffee grounds) in your vomit.  You have black or bloody stools.  You have a severe headache or stiff neck.  You are confused.  You have severe abdominal pain.  You have chest pain or trouble breathing.  You do not urinate at least once every 8 hours.  You develop cold or clammy skin.  You continue to vomit for longer than 24 to 48 hours.  You have a fever. MAKE SURE YOU:   Understand these instructions.  Will watch your condition.  Will get help right away if you are not doing well or get worse. Document Released: 10/06/2005  Document Revised: 12/29/2011 Document Reviewed: 03/05/2011 [facility] Patient Information 2014 [place], [place].

## 2014-01-15 NOTE — ED Notes (Signed)
Patient is unable to urinate at this time. Will try again in 30 minutes.  

## 2014-01-15 NOTE — ED Notes (Signed)
Patient reports that she has been vomiting for 2 days after she has just left he hospital. She has not been able to keep anything down.

## 2014-01-15 NOTE — ED Notes (Signed)
Pt was diagnosed with C diff and COlitis and was in the hospital x 1.5 weeks and started having flare up again 2 days ago.
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
## 2014-01-16 ENCOUNTER — Ambulatory Visit: Payer: No Typology Code available for payment source | Attending: Internal Medicine | Admitting: Internal Medicine

## 2014-01-16 ENCOUNTER — Encounter: Payer: Self-pay | Admitting: Internal Medicine

## 2014-01-16 ENCOUNTER — Other Ambulatory Visit: Payer: Self-pay | Admitting: Gastroenterology

## 2014-01-16 VITALS — BP 143/97 | HR 64 | Temp 97.9°F | Resp 16 | Wt 119.6 lb

## 2014-01-16 DIAGNOSIS — Z79899 Other long term (current) drug therapy: Secondary | ICD-10-CM | POA: Insufficient documentation

## 2014-01-16 DIAGNOSIS — K0889 Other specified disorders of teeth and supporting structures: Secondary | ICD-10-CM

## 2014-01-16 DIAGNOSIS — K089 Disorder of teeth and supporting structures, unspecified: Secondary | ICD-10-CM | POA: Insufficient documentation

## 2014-01-16 DIAGNOSIS — F17200 Nicotine dependence, unspecified, uncomplicated: Secondary | ICD-10-CM | POA: Insufficient documentation

## 2014-01-16 DIAGNOSIS — K5289 Other specified noninfective gastroenteritis and colitis: Secondary | ICD-10-CM | POA: Insufficient documentation

## 2014-01-16 DIAGNOSIS — R21 Rash and other nonspecific skin eruption: Secondary | ICD-10-CM

## 2014-01-16 DIAGNOSIS — K029 Dental caries, unspecified: Secondary | ICD-10-CM | POA: Insufficient documentation

## 2014-01-16 DIAGNOSIS — F411 Generalized anxiety disorder: Secondary | ICD-10-CM | POA: Insufficient documentation

## 2014-01-16 DIAGNOSIS — F4310 Post-traumatic stress disorder, unspecified: Secondary | ICD-10-CM | POA: Insufficient documentation

## 2014-01-16 DIAGNOSIS — Z791 Long term (current) use of non-steroidal anti-inflammatories (NSAID): Secondary | ICD-10-CM | POA: Insufficient documentation

## 2014-01-16 DIAGNOSIS — K219 Gastro-esophageal reflux disease without esophagitis: Secondary | ICD-10-CM | POA: Insufficient documentation

## 2014-01-16 DIAGNOSIS — K7689 Other specified diseases of liver: Secondary | ICD-10-CM | POA: Insufficient documentation

## 2014-01-16 DIAGNOSIS — I10 Essential (primary) hypertension: Secondary | ICD-10-CM | POA: Insufficient documentation

## 2014-01-16 MED ORDER — METHYLPREDNISOLONE 4 MG PO KIT: PACK | ORAL | Status: DC
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
## 2014-01-16 NOTE — Progress Notes (Signed)
Patient here for follow up Recently diagnosed with crohn's disease and colitis Has some dental issues-would like referral to dentist as well As something for pain from her broken teeth

## 2014-01-17 NOTE — Addendum Note (Signed)
Addended by: Wilford Corner on: 01/17/2014 08:48 AM   Modules accepted: Orders

## 2014-01-18 ENCOUNTER — Encounter (HOSPITAL_COMMUNITY)
Admission: RE | Disposition: A | Discharge status: Home / Self Care | Payer: Self-pay | Source: Ambulatory Visit | Attending: Gastroenterology

## 2014-01-18 ENCOUNTER — Ambulatory Visit (HOSPITAL_COMMUNITY): Payer: No Typology Code available for payment source | Admitting: Anesthesiology

## 2014-01-18 ENCOUNTER — Encounter (HOSPITAL_COMMUNITY): Payer: No Typology Code available for payment source | Admitting: Anesthesiology

## 2014-01-18 ENCOUNTER — Encounter (HOSPITAL_COMMUNITY): Payer: Self-pay | Admitting: *Deleted

## 2014-01-18 ENCOUNTER — Ambulatory Visit (HOSPITAL_COMMUNITY)
Admission: RE | Admit: 2014-01-18 | Discharge: 2014-01-18 | Disposition: A | Discharge status: Home / Self Care | Payer: No Typology Code available for payment source | Source: Ambulatory Visit | Attending: Gastroenterology | Admitting: Gastroenterology

## 2014-01-18 DIAGNOSIS — K7689 Other specified diseases of liver: Secondary | ICD-10-CM | POA: Insufficient documentation

## 2014-01-18 DIAGNOSIS — D128 Benign neoplasm of rectum: Secondary | ICD-10-CM | POA: Insufficient documentation

## 2014-01-18 DIAGNOSIS — R109 Unspecified abdominal pain: Secondary | ICD-10-CM | POA: Insufficient documentation

## 2014-01-18 DIAGNOSIS — K648 Other hemorrhoids: Secondary | ICD-10-CM | POA: Insufficient documentation

## 2014-01-18 DIAGNOSIS — I10 Essential (primary) hypertension: Secondary | ICD-10-CM | POA: Insufficient documentation

## 2014-01-18 DIAGNOSIS — D129 Benign neoplasm of anus and anal canal: Secondary | ICD-10-CM

## 2014-01-18 DIAGNOSIS — R197 Diarrhea, unspecified: Secondary | ICD-10-CM | POA: Insufficient documentation

## 2014-01-18 DIAGNOSIS — F17200 Nicotine dependence, unspecified, uncomplicated: Secondary | ICD-10-CM | POA: Insufficient documentation

## 2014-01-18 HISTORY — PX: COLONOSCOPY WITH PROPOFOL: SHX5780

## 2014-01-18 HISTORY — DX: Noninfective gastroenteritis and colitis, unspecified: K52.9

## 2014-01-18 HISTORY — DX: Post-traumatic stress disorder, unspecified: F43.10

## 2014-01-18 HISTORY — DX: Anxiety disorder, unspecified: F41.9

## 2014-01-18 HISTORY — DX: Hemorrhage, not elsewhere classified: R58

## 2014-01-18 SURGERY — COLONOSCOPY WITH PROPOFOL
Anesthesia: Monitor Anesthesia Care

## 2014-01-18 MED ORDER — FENTANYL CITRATE 0.05 MG/ML IJ SOLN
INTRAMUSCULAR | Status: DC | PRN
  Administered 2014-01-18: 75 ug via INTRAVENOUS
  Administered 2014-01-18: 25 ug via INTRAVENOUS

## 2014-01-18 MED ORDER — MIDAZOLAM HCL 5 MG/5ML IJ SOLN
INTRAMUSCULAR | Status: DC | PRN
  Administered 2014-01-18: 2 mg via INTRAVENOUS
  Administered 2014-01-18: 1 mg via INTRAVENOUS

## 2014-01-18 MED ORDER — GLYCOPYRROLATE 0.2 MG/ML IJ SOLN
INTRAMUSCULAR | Status: AC
  Filled 2014-01-18: qty 2

## 2014-01-18 MED ORDER — EPHEDRINE SULFATE 50 MG/ML IJ SOLN
INTRAMUSCULAR | Status: AC
  Filled 2014-01-18: qty 1

## 2014-01-18 MED ORDER — SODIUM CHLORIDE 0.9 % IJ SOLN
INTRAMUSCULAR | Status: AC
  Filled 2014-01-18: qty 10

## 2014-01-18 MED ORDER — LACTATED RINGERS IV SOLN
INTRAVENOUS | Status: DC | PRN
  Administered 2014-01-18: 14:00:00 via INTRAVENOUS

## 2014-01-18 MED ORDER — MIDAZOLAM HCL 2 MG/2ML IJ SOLN
INTRAMUSCULAR | Status: AC
  Filled 2014-01-18: qty 2

## 2014-01-18 MED ORDER — ONDANSETRON HCL 4 MG/2ML IJ SOLN
INTRAMUSCULAR | Status: DC | PRN
  Administered 2014-01-18 (×2): 2 mg via INTRAVENOUS

## 2014-01-18 MED ORDER — FENTANYL CITRATE 0.05 MG/ML IJ SOLN
INTRAMUSCULAR | Status: AC
  Filled 2014-01-18: qty 2

## 2014-01-18 MED ORDER — ONDANSETRON HCL 4 MG/2ML IJ SOLN
INTRAMUSCULAR | Status: AC
  Filled 2014-01-18: qty 2

## 2014-01-18 MED ORDER — PROPOFOL INFUSION 10 MG/ML OPTIME
INTRAVENOUS | Status: DC | PRN
  Administered 2014-01-18: 100 ug/kg/min via INTRAVENOUS

## 2014-01-18 MED ORDER — KETAMINE HCL 10 MG/ML IJ SOLN
INTRAMUSCULAR | Status: DC | PRN
  Administered 2014-01-18: 25 mg via INTRAVENOUS

## 2014-01-18 MED ORDER — SODIUM CHLORIDE 0.9 % IV SOLN: INTRAVENOUS | Status: DC

## 2014-01-18 MED ORDER — PROPOFOL 10 MG/ML IV BOLUS
INTRAVENOUS | Status: AC
  Filled 2014-01-18: qty 20

## 2014-01-18 MED ORDER — KETAMINE HCL 10 MG/ML IJ SOLN
INTRAMUSCULAR | Status: AC
  Filled 2014-01-18: qty 1

## 2014-01-18 SURGICAL SUPPLY — 21 items

## 2014-01-18 NOTE — Anesthesia Preprocedure Evaluation (Signed)
Anesthesia Evaluation  Patient identified by MRN, date of birth, ID band Patient awake    Reviewed: Allergy & Precautions, H&P , NPO status , Patient's Chart, lab work & pertinent test results  Airway Mallampati: II TM Distance: >3 FB Neck ROM: Full    Dental no notable dental hx.    Pulmonary Current Smoker,  breath sounds clear to auscultation  Pulmonary exam normal       Cardiovascular hypertension, Pt. on medications Rhythm:Regular Rate:Normal     Neuro/Psych negative neurological ROS  negative psych ROS   GI/Hepatic negative GI ROS, NASH   Endo/Other  negative endocrine ROS  Renal/GU negative Renal ROS  negative genitourinary   Musculoskeletal negative musculoskeletal ROS (+)   Abdominal   Peds negative pediatric ROS (+)  Hematology negative hematology ROS (+)   Anesthesia Other Findings   Reproductive/Obstetrics negative OB ROS                           Anesthesia Physical Anesthesia Plan  ASA: II  Anesthesia Plan: MAC   Post-op Pain Management:    Induction: Intravenous  Airway Management Planned: Nasal Cannula  Additional Equipment:   Intra-op Plan:   Post-operative Plan: Extubation in OR  Informed Consent: I have reviewed the patients History and Physical, chart, labs and discussed the procedure including the risks, benefits and alternatives for the proposed anesthesia with the patient or authorized representative who has indicated his/her understanding and acceptance.   Dental advisory given  Plan Discussed with: CRNA and Surgeon  Anesthesia Plan Comments:         Anesthesia Quick Evaluation

## 2014-01-18 NOTE — Op Note (Addendum)
[facility] [place], [address]   COLONOSCOPY PROCEDURE REPORT  PATIENT: Jamie, Allen  MR#: [id number] BIRTHDATE: 07-07-1971 , 42  yrs. old GENDER: Female ENDOSCOPIST: Wilford Corner, MD REFERRED BY: PROCEDURE DATE:  01/18/2014 PROCEDURE:   Colonoscopy with biopsy ASA CLASS:   Class II INDICATIONS:chronic diarrhea and Abdominal pain. MEDICATIONS: See Anesthesia Report.  DESCRIPTION OF PROCEDURE:   After the risks benefits and alternatives of the procedure were thoroughly explained, informed consent was obtained.  The Pentax Ped Colon [id number]  endoscope was introduced through the anus and advanced to the cecum, which was identified by both the appendix and ileocecal valve , limited by No adverse events experienced.   The quality of the prep was good. . The instrument was then slowly withdrawn as the colon was fully examined.     FINDINGS:  Rectal exam unremarkable.  Pediatric colonoscope inserted into the colon and advanced to the cecum, where the appendiceal orifice and ileocecal valve were identified.  The terminal ileum was intubated and was normal in appearance. Biopsies taken of the terminal ileum to send for histology.  On careful withdrawal of the colonoscope the right colon was normal in appearance. Random biopsies taken of the colon to send for histology. 8 flat small (61m-4mm) polyps were removed with cold biopsy forceps from the rectosigmoid colon and rectum.   Retroflexion revealed small internal hemorrhoids.  COMPLICATIONS: None  IMPRESSION:     Multiple small polyps (8) removed as above; Small internal hemorrhoids; No evidence of colitis  RECOMMENDATIONS: F/U on path    ______________________________ eSigned:Wilford Corner MD 01/20/2014 12:51 PM Revised: 01/20/2014 12:51 PM  CC:

## 2014-01-18 NOTE — H&P (Signed)
  Date of Initial H&P: 01/02/14  History reviewed, patient examined, no change in status, stable for surgery.

## 2014-01-18 NOTE — Interval H&P Note (Signed)
History and Physical Interval Note:  01/18/2014 12:57 PM  Jamie Allen  has presented today for surgery, with the diagnosis of diarrhea  The various methods of treatment have been discussed with the patient and family. After consideration of risks, benefits and other options for treatment, the patient has consented to  Procedure(s): COLONOSCOPY WITH PROPOFOL (N/A) as a surgical intervention .  The patient's history has been reviewed, patient examined, no change in status, stable for surgery.  I have reviewed the patient's chart and labs.  Questions were answered to the patient's satisfaction.     [name]

## 2014-01-18 NOTE — Discharge Instructions (Signed)
Will call you when the pathology results are complete. Avoid aspirin and ibuprofen products for the next 2 weeks.

## 2014-01-19 ENCOUNTER — Encounter (HOSPITAL_COMMUNITY): Payer: Self-pay | Admitting: Gastroenterology

## 2014-02-01 ENCOUNTER — Telehealth: Payer: Self-pay

## 2014-02-01 NOTE — Telephone Encounter (Signed)
Spoke with Jamie Allen today . He stated he pulled two ticks off Jamie Allen Today.  Instructed him to clean the area and put some neosporin on the Sites and just monitor her for any symptoms

## 2014-02-02 ENCOUNTER — Inpatient Hospital Stay (HOSPITAL_COMMUNITY)
Admission: EM | Admit: 2014-02-02 | Discharge: 2014-02-08 | DRG: 392 | Disposition: A | Discharge status: Home / Self Care | Payer: No Typology Code available for payment source | Attending: Internal Medicine | Admitting: Internal Medicine

## 2014-02-02 ENCOUNTER — Encounter (HOSPITAL_COMMUNITY): Payer: Self-pay | Admitting: Emergency Medicine

## 2014-02-02 ENCOUNTER — Emergency Department (HOSPITAL_COMMUNITY): Payer: No Typology Code available for payment source

## 2014-02-02 DIAGNOSIS — A0472 Enterocolitis due to Clostridium difficile, not specified as recurrent: Secondary | ICD-10-CM | POA: Insufficient documentation

## 2014-02-02 DIAGNOSIS — IMO0002 Reserved for concepts with insufficient information to code with codable children: Secondary | ICD-10-CM

## 2014-02-02 DIAGNOSIS — R945 Abnormal results of liver function studies: Secondary | ICD-10-CM

## 2014-02-02 DIAGNOSIS — R1115 Cyclical vomiting syndrome unrelated to migraine: Secondary | ICD-10-CM | POA: Diagnosis present

## 2014-02-02 DIAGNOSIS — K529 Noninfective gastroenteritis and colitis, unspecified: Secondary | ICD-10-CM

## 2014-02-02 DIAGNOSIS — R160 Hepatomegaly, not elsewhere classified: Secondary | ICD-10-CM | POA: Diagnosis present

## 2014-02-02 DIAGNOSIS — K2960 Other gastritis without bleeding: Principal | ICD-10-CM | POA: Diagnosis present

## 2014-02-02 DIAGNOSIS — R197 Diarrhea, unspecified: Secondary | ICD-10-CM | POA: Diagnosis present

## 2014-02-02 DIAGNOSIS — F1011 Alcohol abuse, in remission: Secondary | ICD-10-CM | POA: Diagnosis present

## 2014-02-02 DIAGNOSIS — F411 Generalized anxiety disorder: Secondary | ICD-10-CM | POA: Diagnosis present

## 2014-02-02 DIAGNOSIS — F17200 Nicotine dependence, unspecified, uncomplicated: Secondary | ICD-10-CM | POA: Diagnosis present

## 2014-02-02 DIAGNOSIS — R748 Abnormal levels of other serum enzymes: Secondary | ICD-10-CM | POA: Diagnosis present

## 2014-02-02 DIAGNOSIS — R7989 Other specified abnormal findings of blood chemistry: Secondary | ICD-10-CM | POA: Diagnosis present

## 2014-02-02 DIAGNOSIS — K7581 Nonalcoholic steatohepatitis (NASH): Secondary | ICD-10-CM

## 2014-02-02 DIAGNOSIS — I10 Essential (primary) hypertension: Secondary | ICD-10-CM | POA: Diagnosis present

## 2014-02-02 DIAGNOSIS — R109 Unspecified abdominal pain: Secondary | ICD-10-CM | POA: Diagnosis present

## 2014-02-02 DIAGNOSIS — K7030 Alcoholic cirrhosis of liver without ascites: Secondary | ICD-10-CM | POA: Diagnosis present

## 2014-02-02 DIAGNOSIS — K219 Gastro-esophageal reflux disease without esophagitis: Secondary | ICD-10-CM | POA: Diagnosis present

## 2014-02-02 DIAGNOSIS — K7460 Unspecified cirrhosis of liver: Secondary | ICD-10-CM

## 2014-02-02 DIAGNOSIS — R112 Nausea with vomiting, unspecified: Secondary | ICD-10-CM

## 2014-02-02 HISTORY — DX: Enterocolitis due to Clostridium difficile, not specified as recurrent: A04.72

## 2014-02-02 LAB — COMPREHENSIVE METABOLIC PANEL
ALBUMIN: 3.9 g/dL (ref 3.5–5.2)
ALT: 98 U/L — ABNORMAL HIGH (ref 0–35)
AST: 253 U/L — AB (ref 0–37)
Alkaline Phosphatase: 190 U/L — ABNORMAL HIGH (ref 39–117)
BUN: 5 mg/dL — ABNORMAL LOW (ref 6–23)
CALCIUM: 8.7 mg/dL (ref 8.4–10.5)
CO2: 24 mEq/L (ref 19–32)
CREATININE: 0.58 mg/dL (ref 0.50–1.10)
Chloride: 97 mEq/L (ref 96–112)
GFR calc Af Amer: >90 mL/min (ref >90)
GFR calc non Af Amer: >90 mL/min (ref >90)
Glucose, Bld: 120 mg/dL — ABNORMAL HIGH (ref 70–99)
Potassium: 3.4 mEq/L — ABNORMAL LOW (ref 3.7–5.3)
Sodium: 135 mEq/L — ABNORMAL LOW (ref 137–147)
TOTAL PROTEIN: 6.9 g/dL (ref 6.0–8.3)
Total Bilirubin: 0.6 mg/dL (ref 0.3–1.2)

## 2014-02-02 LAB — CBC WITH DIFFERENTIAL/PLATELET
BASOS ABS: 0.1 10*3/uL (ref 0.0–0.1)
BASOS PCT: 2 % — AB (ref 0–1)
Eosinophils Absolute: 0.1 10*3/uL (ref 0.0–0.7)
Eosinophils Relative: 2 % (ref 0–5)
HCT: 38.5 % (ref 36.0–46.0)
HEMOGLOBIN: 13.8 g/dL (ref 12.0–15.0)
LYMPHS ABS: 1.1 10*3/uL (ref 0.7–4.0)
LYMPHS PCT: 22 % (ref 12–46)
MCH: 36.4 pg — AB (ref 26.0–34.0)
MCHC: 35.8 g/dL (ref 30.0–36.0)
MCV: 101.6 fL — ABNORMAL HIGH (ref 78.0–100.0)
MONOS PCT: 4 % (ref 3–12)
Monocytes Absolute: 0.2 10*3/uL (ref 0.1–1.0)
NEUTROS ABS: 3.4 10*3/uL (ref 1.7–7.7)
NEUTROS PCT: 70 % (ref 43–77)
Platelets: 198 10*3/uL (ref 150–400)
RBC: 3.79 MIL/uL — AB (ref 3.87–5.11)
RDW: 15.2 % (ref 11.5–15.5)
WBC: 4.8 10*3/uL (ref 4.0–10.5)

## 2014-02-02 LAB — OCCULT BLOOD X 1 CARD TO LAB, STOOL: Fecal Occult Bld: NEGATIVE

## 2014-02-02 LAB — LIPASE, BLOOD: LIPASE: 60 U/L — AB (ref 11–59)

## 2014-02-02 MED ORDER — ONDANSETRON HCL 4 MG/2ML IJ SOLN
4.0000 mg | Freq: Once | INTRAMUSCULAR | Status: AC
  Administered 2014-02-02: 4 mg via INTRAVENOUS
  Filled 2014-02-02: qty 2

## 2014-02-02 MED ORDER — SODIUM CHLORIDE 0.9 % IV BOLUS (SEPSIS)
1000.0000 mL | Freq: Once | INTRAVENOUS | Status: AC
  Administered 2014-02-02: 1000 mL via INTRAVENOUS

## 2014-02-02 MED ORDER — SODIUM CHLORIDE 0.9 % IV SOLN
INTRAVENOUS | Status: AC
  Administered 2014-02-02: 20:00:00 via INTRAVENOUS

## 2014-02-02 MED ORDER — ADULT MULTIVITAMIN W/MINERALS CH
1.0000 | ORAL_TABLET | Freq: Every day | ORAL | Status: DC
  Administered 2014-02-03 – 2014-02-08 (×3): 1 via ORAL
  Filled 2014-02-02 (×7): qty 1

## 2014-02-02 MED ORDER — FENTANYL CITRATE 0.05 MG/ML IJ SOLN
50.0000 ug | INTRAMUSCULAR | Status: DC | PRN
  Administered 2014-02-02: 50 ug via INTRAVENOUS
  Filled 2014-02-02: qty 2

## 2014-02-02 MED ORDER — FENTANYL CITRATE 0.05 MG/ML IJ SOLN
100.0000 ug | Freq: Once | INTRAMUSCULAR | Status: AC
  Administered 2014-02-02: 100 ug via INTRAVENOUS
  Filled 2014-02-02: qty 2

## 2014-02-02 MED ORDER — DIPHENHYDRAMINE HCL 25 MG PO CAPS: 25.0000 mg | ORAL_CAPSULE | Freq: Every day | ORAL | Status: DC | PRN

## 2014-02-02 MED ORDER — FENTANYL CITRATE 0.05 MG/ML IJ SOLN
25.0000 ug | Freq: Once | INTRAMUSCULAR | Status: AC
  Administered 2014-02-02: 25 ug via INTRAVENOUS
  Filled 2014-02-02: qty 2

## 2014-02-02 MED ORDER — FENTANYL CITRATE 0.05 MG/ML IJ SOLN
75.0000 ug | INTRAMUSCULAR | Status: DC | PRN
  Administered 2014-02-02 – 2014-02-03 (×7): 75 ug via INTRAVENOUS
  Filled 2014-02-02 (×7): qty 2

## 2014-02-02 MED ORDER — NICOTINE 21 MG/24HR TD PT24
21.0000 mg | MEDICATED_PATCH | Freq: Every day | TRANSDERMAL | Status: DC
  Administered 2014-02-03 – 2014-02-08 (×6): 21 mg via TRANSDERMAL
  Filled 2014-02-02 (×6): qty 1

## 2014-02-02 MED ORDER — METRONIDAZOLE 500 MG PO TABS
500.0000 mg | ORAL_TABLET | Freq: Three times a day (TID) | ORAL | Status: DC
  Filled 2014-02-02 (×2): qty 1

## 2014-02-02 MED ORDER — SACCHAROMYCES BOULARDII 250 MG PO CAPS
250.0000 mg | ORAL_CAPSULE | Freq: Two times a day (BID) | ORAL | Status: DC
  Administered 2014-02-03 – 2014-02-08 (×11): 250 mg via ORAL
  Filled 2014-02-02 (×13): qty 1

## 2014-02-02 MED ORDER — ONDANSETRON HCL 4 MG PO TABS: 4.0000 mg | ORAL_TABLET | Freq: Four times a day (QID) | ORAL | Status: DC | PRN

## 2014-02-02 MED ORDER — POTASSIUM CHLORIDE IN NACL 20-0.9 MEQ/L-% IV SOLN
INTRAVENOUS | Status: DC
  Administered 2014-02-02 – 2014-02-07 (×8): via INTRAVENOUS
  Administered 2014-02-08: 50 mL/h via INTRAVENOUS
  Filled 2014-02-02 (×11): qty 1000

## 2014-02-02 MED ORDER — HEPARIN SODIUM (PORCINE) 5000 UNIT/ML IJ SOLN
5000.0000 [IU] | Freq: Three times a day (TID) | INTRAMUSCULAR | Status: DC
  Administered 2014-02-02 – 2014-02-08 (×17): 5000 [IU] via SUBCUTANEOUS
  Filled 2014-02-02 (×21): qty 1

## 2014-02-02 MED ORDER — FOLIC ACID 1 MG PO TABS
1.0000 mg | ORAL_TABLET | Freq: Every day | ORAL | Status: DC
  Administered 2014-02-03 – 2014-02-08 (×6): 1 mg via ORAL
  Filled 2014-02-02 (×7): qty 1

## 2014-02-02 MED ORDER — ONDANSETRON HCL 4 MG/2ML IJ SOLN
4.0000 mg | Freq: Four times a day (QID) | INTRAMUSCULAR | Status: DC | PRN
  Administered 2014-02-02 – 2014-02-05 (×4): 4 mg via INTRAVENOUS
  Filled 2014-02-02 (×4): qty 2

## 2014-02-02 MED ORDER — METRONIDAZOLE IN NACL 5-0.79 MG/ML-% IV SOLN
500.0000 mg | Freq: Three times a day (TID) | INTRAVENOUS | Status: DC
Start: 2014-02-02 — End: 2014-02-04
  Administered 2014-02-02 – 2014-02-04 (×5): 500 mg via INTRAVENOUS
  Filled 2014-02-02 (×6): qty 100
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
## 2014-02-02 NOTE — ED Notes (Addendum)
Pt was in hospital recently, released, now thinks she may have C.diff. Has been sick for the past 4 days vomiting and foul smelling diarrhea. C/o pain in lower right quadrant.

## 2014-02-03 ENCOUNTER — Observation Stay (HOSPITAL_COMMUNITY): Payer: No Typology Code available for payment source

## 2014-02-03 DIAGNOSIS — K7460 Unspecified cirrhosis of liver: Secondary | ICD-10-CM

## 2014-02-03 DIAGNOSIS — R7989 Other specified abnormal findings of blood chemistry: Secondary | ICD-10-CM

## 2014-02-03 DIAGNOSIS — A0472 Enterocolitis due to Clostridium difficile, not specified as recurrent: Secondary | ICD-10-CM

## 2014-02-03 LAB — COMPREHENSIVE METABOLIC PANEL
ALT: 81 U/L — AB (ref 0–35)
AST: 193 U/L — ABNORMAL HIGH (ref 0–37)
Albumin: 3.9 g/dL (ref 3.5–5.2)
Alkaline Phosphatase: 176 U/L — ABNORMAL HIGH (ref 39–117)
BUN: 3 mg/dL — AB (ref 6–23)
CALCIUM: 7.8 mg/dL — AB (ref 8.4–10.5)
CO2: 27 mEq/L (ref 19–32)
Chloride: 99 mEq/L (ref 96–112)
Creatinine, Ser: 0.55 mg/dL (ref 0.50–1.10)
GFR calc non Af Amer: >90 mL/min (ref >90)
Glucose, Bld: 104 mg/dL — ABNORMAL HIGH (ref 70–99)
Potassium: 3.7 mEq/L (ref 3.7–5.3)
SODIUM: 139 meq/L (ref 137–147)
TOTAL PROTEIN: 6.7 g/dL (ref 6.0–8.3)
Total Bilirubin: 1.2 mg/dL (ref 0.3–1.2)

## 2014-02-03 LAB — MAGNESIUM: Magnesium: 0.8 mg/dL — CL (ref 1.5–2.5)

## 2014-02-03 LAB — TSH: TSH: 5.48 u[IU]/mL — AB (ref 0.350–4.500)

## 2014-02-03 LAB — CLOSTRIDIUM DIFFICILE BY PCR: Toxigenic C. Difficile by PCR: NEGATIVE

## 2014-02-03 LAB — PHOSPHORUS: Phosphorus: 4.3 mg/dL (ref 2.3–4.6)

## 2014-02-03 MED ORDER — KETOROLAC TROMETHAMINE 15 MG/ML IJ SOLN
15.0000 mg | Freq: Four times a day (QID) | INTRAMUSCULAR | Status: AC | PRN
  Administered 2014-02-03 – 2014-02-07 (×13): 15 mg via INTRAVENOUS
  Filled 2014-02-03 (×13): qty 1

## 2014-02-03 MED ORDER — BOOST / RESOURCE BREEZE PO LIQD
1.0000 | Freq: Three times a day (TID) | ORAL | Status: DC
  Administered 2014-02-03 – 2014-02-08 (×13): 1 via ORAL

## 2014-02-03 MED ORDER — MAGNESIUM SULFATE 40 MG/ML IJ SOLN
2.0000 g | Freq: Once | INTRAMUSCULAR | Status: AC
  Administered 2014-02-03: 2 g via INTRAVENOUS
  Filled 2014-02-03: qty 50

## 2014-02-03 MED ORDER — LORAZEPAM 1 MG PO TABS
1.0000 mg | ORAL_TABLET | Freq: Four times a day (QID) | ORAL | Status: DC | PRN
  Administered 2014-02-03 – 2014-02-07 (×12): 1 mg via ORAL
  Filled 2014-02-03 (×12): qty 1

## 2014-02-03 MED ORDER — METOCLOPRAMIDE HCL 10 MG PO TABS
5.0000 mg | ORAL_TABLET | Freq: Three times a day (TID) | ORAL | Status: DC | PRN
  Administered 2014-02-04: 5 mg via ORAL
  Filled 2014-02-03: qty 1

## 2014-02-03 MED ORDER — SODIUM CHLORIDE 0.9 % IV SOLN
1.0000 g | Freq: Once | INTRAVENOUS | Status: AC
  Administered 2014-02-03: 1 g via INTRAVENOUS
  Filled 2014-02-03: qty 10

## 2014-02-03 NOTE — Progress Notes (Signed)
[facility] PROGRESS NOTE  Donn Wilmot HDQ:[id number] DOB: Feb 14, 1971 DOA: 02/02/2014 PCP: Lorayne Marek, MD  Assessment/Plan: 43 y/o female with PMH of HTN, GERD, liver cirrhosis, NASH, alcoholism who recently Dx with c diff could not tolerated Po vanc presented with nausea, vomiting, diarrhea   1. C diff ? Persistent; patient was not taking PO vanc   -changed to IV flagyl; cont florastor   2. Persistent vomiting, nausea ? Etiology ? Related to NASH; KUB: unremarkable  -CT abd (3/29): Hepatomegaly and hepatosteatosis, no colitis; Normal gastric emptying study on 12/2013 -s/p colonoscopy (01/2014): polyps, but no colitis  -consult GI ? Need EGD/EUS, cont antiemetics, IVF  3. Elevated liver enzymes with NASH, cirrhosis ; +alcohol use  -monitor LFTs improving; stop drinking alcohol    4. Anxiety, prn ativan   Code Status: full Family Communication: d/w patient (indicate person spoken with, relationship, and if by phone, the number) Disposition Plan: home 24-48 hours    Consultants:  GI  Procedures:  none  Antibiotics:  metronidazol 417<<<<   (indicate start date, and stop date if known)  HPI/Subjective: alert  Objective: Filed Vitals:   02/03/14 0600  BP: 134/91  Pulse: 93  Temp: 98.1 F (36.7 C)  Resp: 18    Intake/Output Summary (Last 24 hours) at 02/03/14 1152 Last data filed at 02/03/14 0600  Gross per 24 hour  Intake 1174.99 ml  Output   1000 ml  Net 174.99 ml   Filed Weights   02/02/14 1519 02/03/14 0142  Weight: 51.88 kg (114 lb 6 oz) 51.88 kg (114 lb 6 oz)    Exam:   General:  alert  Cardiovascular: s1,s2 rrr  Respiratory: CTA BL  Abdomen: soft, mild tender, no rebound   Musculoskeletal: no LE edema   Data Reviewed: Basic Metabolic Panel:  Recent Labs Lab 02/02/14 1530 02/03/14 0445  NA 135* 139  K 3.4* 3.7  CL 97 99  CO2 24 27  GLUCOSE 120* 104*  BUN 5* 3*  CREATININE 0.58 0.55  CALCIUM 8.7 7.8*  MG  --   0.8*  PHOS  --  4.3   Liver Function Tests:  Recent Labs Lab 02/02/14 1530 02/03/14 0445  AST 253* 193*  ALT 98* 81*  ALKPHOS 190* 176*  BILITOT 0.6 1.2  PROT 6.9 6.7  ALBUMIN 3.9 3.9    Recent Labs Lab 02/02/14 1530  LIPASE 60*   No results found for this basename: AMMONIA,  in the last 168 hours CBC:  Recent Labs Lab 02/02/14 1530  WBC 4.8  NEUTROABS 3.4  HGB 13.8  HCT 38.5  MCV 101.6*  PLT 198   Cardiac Enzymes: No results found for this basename: CKTOTAL, CKMB, CKMBINDEX, TROPONINI,  in the last 168 hours BNP (last 3 results) No results found for this basename: PROBNP,  in the last 8760 hours CBG: No results found for this basename: GLUCAP,  in the last 168 hours  Recent Results (from the past 240 hour(s))  CLOSTRIDIUM DIFFICILE BY PCR     Status: None   Collection Time    02/02/14 11:43 PM      Result Value Ref Range Status   C difficile by pcr NEGATIVE  NEGATIVE Final   Comment: Performed at [facility]     Studies: Dg Abd Acute W/chest  02/02/2014   CLINICAL DATA:  Diarrhea and abdomen pain  EXAM: ACUTE ABDOMEN SERIES (ABDOMEN 2 VIEW & CHEST 1 VIEW)  COMPARISON:  None.  FINDINGS: There is  no evidence of dilated bowel loops or free intraperitoneal air. No radiopaque calculi or other significant radiographic abnormality is seen. Heart size and mediastinal contours are within normal limits. Both lungs are clear.  IMPRESSION: Negative abdominal radiographs.  No acute cardiopulmonary disease.   Electronically Signed   By: Abelardo Diesel M.D.   On: 02/02/2014 16:25   US Abdomen Limited Ruq  02/03/2014   CLINICAL DATA:  Elevated LFTs  EXAM: US ABDOMEN LIMITED - RIGHT UPPER QUADRANT  COMPARISON:  01/15/2014  FINDINGS: Gallbladder:  No gallstones or wall thickening visualized. No sonographic Murphy sign noted.  Common bile duct:  Diameter: 5 mm  Liver:  Increase in echogenicity consistent with fatty infiltration.  IMPRESSION: No acute abnormality noted.    Electronically Signed   By: Inez Catalina M.D.   On: 02/03/2014 09:11    Scheduled Meds: . folic acid  1 mg Oral Daily  . heparin  5,000 Units Subcutaneous 3 times per day  . metronidazole  500 mg Intravenous Q8H  . multivitamin with minerals  1 tablet Oral Daily  . nicotine  21 mg Transdermal Daily  . saccharomyces boulardii  250 mg Oral BID   Continuous Infusions: . 0.9 % NaCl with KCl 20 mEq / L 100 mL/hr at 02/02/14 2132    Active Problems:   Persistent vomiting   Elevated liver enzymes   C. difficile diarrhea    Time spent: >35 minutes     Kinnie Feil  [facility] Pager [phone]. If 7PM-7AM, please contact night-coverage at [email or web address], password [facility] 02/03/2014, 11:52 AM  LOS: 1 day
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
## 2014-02-03 NOTE — Care Management Note (Signed)
    Page 1 of 1   02/03/2014     3:04:30 PM CARE MANAGEMENT NOTE 02/03/2014  Patient:  Jamie Allen, Jamie Allen   Account Number:  [id number]  Date Initiated:  02/03/2014  Documentation initiated by:  Sunday Spillers  Subjective/Objective Assessment:   43 yo female admitted with persistent nausea/vomiting, elevated LFT's. PTA lived at home with spouse.     Action/Plan:   Home when stable   Anticipated DC Date:  02/06/2014   Anticipated DC Plan:  [place]  CM consult  Medication Assistance      Choice offered to / List presented to:             Status of service:  Completed, signed off Medicare Important Message given?  NA - LOS <3 / Initial given by admissions (If response is "NO", the following Medicare IM given date fields will be blank) Date Medicare IM given:   Date Additional Medicare IM given:    Discharge Disposition:  HOME/SELF CARE  Per UR Regulation:  Reviewed for med. necessity/level of care/duration of stay  If discussed at [place] of Stay Meetings, dates discussed:    Comments:  02-03-14 Sunday Spillers RN CM 1200 Spoke with patient at bedside. Discussed referral for medication assistance, patient denies need for medication assistance, she states she could not take her meds due to nausea/vomiting. Patient is seen at [facility] clinic and has access to med assistance through them. No further needs identified. Will continue to follow for d/c needs.
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
## 2014-02-03 NOTE — Progress Notes (Signed)
UR completed. Patient changed to inpatient- requiring IV flagyl and IVF @ 100cc/hr

## 2014-02-04 DIAGNOSIS — R109 Unspecified abdominal pain: Secondary | ICD-10-CM

## 2014-02-04 DIAGNOSIS — R112 Nausea with vomiting, unspecified: Secondary | ICD-10-CM

## 2014-02-04 DIAGNOSIS — K5289 Other specified noninfective gastroenteritis and colitis: Secondary | ICD-10-CM

## 2014-02-04 LAB — COMPREHENSIVE METABOLIC PANEL
ALT: 62 U/L — AB (ref 0–35)
AST: 163 U/L — ABNORMAL HIGH (ref 0–37)
Albumin: 3.1 g/dL — ABNORMAL LOW (ref 3.5–5.2)
Alkaline Phosphatase: 156 U/L — ABNORMAL HIGH (ref 39–117)
BUN: <3 mg/dL — ABNORMAL LOW (ref 6–23)
CO2: 23 meq/L (ref 19–32)
CREATININE: 0.55 mg/dL (ref 0.50–1.10)
Calcium: 7.8 mg/dL — ABNORMAL LOW (ref 8.4–10.5)
Chloride: 104 mEq/L (ref 96–112)
GLUCOSE: 108 mg/dL — AB (ref 70–99)
Potassium: 3.4 mEq/L — ABNORMAL LOW (ref 3.7–5.3)
Sodium: 140 mEq/L (ref 137–147)
Total Bilirubin: 1.5 mg/dL — ABNORMAL HIGH (ref 0.3–1.2)
Total Protein: 6 g/dL (ref 6.0–8.3)

## 2014-02-04 MED ORDER — METRONIDAZOLE IN NACL 5-0.79 MG/ML-% IV SOLN
500.0000 mg | Freq: Three times a day (TID) | INTRAVENOUS | Status: DC
  Administered 2014-02-04 – 2014-02-08 (×12): 500 mg via INTRAVENOUS
  Filled 2014-02-04 (×13): qty 100

## 2014-02-04 MED ORDER — PANTOPRAZOLE SODIUM 40 MG PO TBEC
40.0000 mg | DELAYED_RELEASE_TABLET | Freq: Every day | ORAL | Status: DC
  Administered 2014-02-04 – 2014-02-07 (×4): 40 mg via ORAL
  Filled 2014-02-04 (×4): qty 1

## 2014-02-04 MED ORDER — ZOLPIDEM TARTRATE 5 MG PO TABS
5.0000 mg | ORAL_TABLET | Freq: Once | ORAL | Status: AC
  Administered 2014-02-04: 5 mg via ORAL
  Filled 2014-02-04: qty 1

## 2014-02-04 NOTE — Progress Notes (Signed)
[facility] PROGRESS NOTE  Emireth Cockerham BPZ:[id number] DOB: 04-07-1971 DOA: 02/02/2014 PCP: Lorayne Marek, MD  Assessment/Plan: 43 y/o female with PMH of HTN, GERD, liver cirrhosis, NASH, alcoholism who recently Dx with c diff could not tolerated Po vanc presented with nausea, vomiting, diarrhea   1. C diff ? Persistent; patient was not taking PO vanc   -cont IV flagyl; cont florastor   2. Persistent vomiting, nausea ? Etiology ? Related to NASH;   -CT abd (3/29): Hepatomegaly and hepatosteatosis, no colitis; Normal gastric emptying study on 12/2013 -s/p colonoscopy (01/2014): polyps, but no colitis  -nausea improved no vomiting >24 hrs; d/c opioids;  -appreciate GI eval; possible EGD planned; cont antiemetics, IVF  3. Elevated liver enzymes with NASH, cirrhosis ; +alcohol use; AST>ALT  -monitor LFTs improving; stop drinking alcohol    4. Anxiety, prn ativan   Code Status: full Family Communication: d/w patient (indicate person spoken with, relationship, and if by phone, the number) Disposition Plan: home 24-48 hours    Consultants:  GI  Procedures:  none  Antibiotics:  metronidazol 417<<<<   (indicate start date, and stop date if known)  HPI/Subjective: alert  Objective: Filed Vitals:   02/04/14 0607  BP: 119/85  Pulse: 105  Temp: 99 F (37.2 C)  Resp: 20    Intake/Output Summary (Last 24 hours) at 02/04/14 1050 Last data filed at 02/04/14 1025  Gross per 24 hour  Intake   4345 ml  Output    452 ml  Net   3893 ml   Filed Weights   02/02/14 1519 02/03/14 0142  Weight: 51.88 kg (114 lb 6 oz) 51.88 kg (114 lb 6 oz)    Exam:   General:  alert  Cardiovascular: s1,s2 rrr  Respiratory: CTA BL  Abdomen: soft, mild tender, no rebound   Musculoskeletal: no LE edema   Data Reviewed: Basic Metabolic Panel:  Recent Labs Lab 02/02/14 1530 02/03/14 0445 02/04/14 0537  NA 135* 139 140  K 3.4* 3.7 3.4*  CL 97 99 104  CO2 24 27 23    GLUCOSE 120* 104* 108*  BUN 5* 3* <3*  CREATININE 0.58 0.55 0.55  CALCIUM 8.7 7.8* 7.8*  MG  --  0.8*  --   PHOS  --  4.3  --    Liver Function Tests:  Recent Labs Lab 02/02/14 1530 02/03/14 0445 02/04/14 0537  AST 253* 193* 163*  ALT 98* 81* 62*  ALKPHOS 190* 176* 156*  BILITOT 0.6 1.2 1.5*  PROT 6.9 6.7 6.0  ALBUMIN 3.9 3.9 3.1*    Recent Labs Lab 02/02/14 1530  LIPASE 60*   No results found for this basename: AMMONIA,  in the last 168 hours CBC:  Recent Labs Lab 02/02/14 1530  WBC 4.8  NEUTROABS 3.4  HGB 13.8  HCT 38.5  MCV 101.6*  PLT 198   Cardiac Enzymes: No results found for this basename: CKTOTAL, CKMB, CKMBINDEX, TROPONINI,  in the last 168 hours BNP (last 3 results) No results found for this basename: PROBNP,  in the last 8760 hours CBG: No results found for this basename: GLUCAP,  in the last 168 hours  Recent Results (from the past 240 hour(s))  CLOSTRIDIUM DIFFICILE BY PCR     Status: None   Collection Time    02/02/14 11:43 PM      Result Value Ref Range Status   C difficile by pcr NEGATIVE  NEGATIVE Final   Comment: Performed at [facility]  Studies: Dg Abd Acute W/chest  02/02/2014   CLINICAL DATA:  Diarrhea and abdomen pain  EXAM: ACUTE ABDOMEN SERIES (ABDOMEN 2 VIEW & CHEST 1 VIEW)  COMPARISON:  None.  FINDINGS: There is no evidence of dilated bowel loops or free intraperitoneal air. No radiopaque calculi or other significant radiographic abnormality is seen. Heart size and mediastinal contours are within normal limits. Both lungs are clear.  IMPRESSION: Negative abdominal radiographs.  No acute cardiopulmonary disease.   Electronically Signed   By: Abelardo Diesel M.D.   On: 02/02/2014 16:25   US Abdomen Limited Ruq  02/03/2014   CLINICAL DATA:  Elevated LFTs  EXAM: US ABDOMEN LIMITED - RIGHT UPPER QUADRANT  COMPARISON:  01/15/2014  FINDINGS: Gallbladder:  No gallstones or wall thickening visualized. No sonographic Murphy sign  noted.  Common bile duct:  Diameter: 5 mm  Liver:  Increase in echogenicity consistent with fatty infiltration.  IMPRESSION: No acute abnormality noted.   Electronically Signed   By: Inez Catalina M.D.   On: 02/03/2014 09:11    Scheduled Meds: . feeding supplement (RESOURCE BREEZE)  1 Container Oral TID BM  . folic acid  1 mg Oral Daily  . heparin  5,000 Units Subcutaneous 3 times per day  . metronidazole  500 mg Intravenous Q8H  . multivitamin with minerals  1 tablet Oral Daily  . nicotine  21 mg Transdermal Daily  . saccharomyces boulardii  250 mg Oral BID   Continuous Infusions: . 0.9 % NaCl with KCl 20 mEq / L 100 mL/hr at 02/04/14 0044    Active Problems:   Persistent vomiting   Elevated liver enzymes   C. difficile diarrhea    Time spent: >35 minutes     Kinnie Feil  [facility] Pager [phone]. If 7PM-7AM, please contact night-coverage at [email or web address], password [facility] 02/04/2014, 10:50 AM  LOS: 2 days

## 2014-02-04 NOTE — Progress Notes (Signed)
Dr. Daleen Bo aware via text pt c/o "heartburn and reflux. Pt takes Nexium at home. Stated "I can't take Maalox." See new orders in Epic per MD.

## 2014-02-05 MED ORDER — ZOLPIDEM TARTRATE 5 MG PO TABS
5.0000 mg | ORAL_TABLET | Freq: Once | ORAL | Status: AC
  Administered 2014-02-05: 5 mg via ORAL
  Filled 2014-02-05: qty 1

## 2014-02-05 NOTE — Progress Notes (Signed)
[facility] PROGRESS NOTE  Jamie Allen GEZ:[id number] DOB: 1971/07/08 DOA: 02/02/2014 PCP: Lorayne Marek, MD  Assessment/Plan: 43 y/o female with PMH of HTN, GERD, liver cirrhosis, NASH, alcoholism who recently Dx with c diff could not tolerated Po vanc presented with nausea, vomiting, diarrhea   1. C diff ? Persistent; patient was not taking PO vanc   -cont IV flagyl; cont florastor   2. Persistent vomiting, nausea ? Etiology ? Related to NASH;   -CT abd (3/29): Hepatomegaly and hepatosteatosis, no colitis; Normal gastric emptying study on 12/2013 -s/p colonoscopy (01/2014): polyps, but no colitis  -nausea improved no vomiting >24 hrs; d/c opioids; advance diet  -appreciate GI eval; possible EGD planned; cont antiemetics, IVF  3. Elevated liver enzymes with NASH, cirrhosis ; +alcohol use; AST>ALT  -monitor LFTs improving; stop drinking alcohol    4. Anxiety, prn ativan   Code Status: full Family Communication: d/w patient (indicate person spoken with, relationship, and if by phone, the number) Disposition Plan: home 24-48 hours    Consultants:  GI  Procedures:  none  Antibiotics:  metronidazol 417<<<<   (indicate start date, and stop date if known)  HPI/Subjective: alert  Objective: Filed Vitals:   02/05/14 0605  BP: 142/92  Pulse: 98  Temp: 98.2 F (36.8 C)  Resp: 18    Intake/Output Summary (Last 24 hours) at 02/05/14 1036 Last data filed at 02/05/14 0952  Gross per 24 hour  Intake 2751.67 ml  Output    400 ml  Net 2351.67 ml   Filed Weights   02/02/14 1519 02/03/14 0142  Weight: 51.88 kg (114 lb 6 oz) 51.88 kg (114 lb 6 oz)    Exam:   General:  alert  Cardiovascular: s1,s2 rrr  Respiratory: CTA BL  Abdomen: soft, mild tender, no rebound   Musculoskeletal: no LE edema   Data Reviewed: Basic Metabolic Panel:  Recent Labs Lab 02/02/14 1530 02/03/14 0445 02/04/14 0537  NA 135* 139 140  K 3.4* 3.7 3.4*  CL 97 99 104   CO2 24 27 23   GLUCOSE 120* 104* 108*  BUN 5* 3* <3*  CREATININE 0.58 0.55 0.55  CALCIUM 8.7 7.8* 7.8*  MG  --  0.8*  --   PHOS  --  4.3  --    Liver Function Tests:  Recent Labs Lab 02/02/14 1530 02/03/14 0445 02/04/14 0537  AST 253* 193* 163*  ALT 98* 81* 62*  ALKPHOS 190* 176* 156*  BILITOT 0.6 1.2 1.5*  PROT 6.9 6.7 6.0  ALBUMIN 3.9 3.9 3.1*    Recent Labs Lab 02/02/14 1530  LIPASE 60*   No results found for this basename: AMMONIA,  in the last 168 hours CBC:  Recent Labs Lab 02/02/14 1530  WBC 4.8  NEUTROABS 3.4  HGB 13.8  HCT 38.5  MCV 101.6*  PLT 198   Cardiac Enzymes: No results found for this basename: CKTOTAL, CKMB, CKMBINDEX, TROPONINI,  in the last 168 hours BNP (last 3 results) No results found for this basename: PROBNP,  in the last 8760 hours CBG: No results found for this basename: GLUCAP,  in the last 168 hours  Recent Results (from the past 240 hour(s))  CLOSTRIDIUM DIFFICILE BY PCR     Status: None   Collection Time    02/02/14 11:43 PM      Result Value Ref Range Status   C difficile by pcr NEGATIVE  NEGATIVE Final   Comment: Performed at [facility]  Studies: No results found.  Scheduled Meds: . feeding supplement (RESOURCE BREEZE)  1 Container Oral TID BM  . folic acid  1 mg Oral Daily  . heparin  5,000 Units Subcutaneous 3 times per day  . metronidazole  500 mg Intravenous Q8H  . multivitamin with minerals  1 tablet Oral Daily  . nicotine  21 mg Transdermal Daily  . pantoprazole  40 mg Oral Daily  . saccharomyces boulardii  250 mg Oral BID   Continuous Infusions: . 0.9 % NaCl with KCl 20 mEq / L 100 mL/hr at 02/05/14 7353    Active Problems:   Persistent vomiting   Elevated liver enzymes   C. difficile diarrhea    Time spent: >35 minutes     Kinnie Feil  [facility] Pager [phone]. If 7PM-7AM, please contact night-coverage at [email or web address], password [facility] 02/05/2014, 10:36 AM  LOS: 3  days

## 2014-02-06 ENCOUNTER — Encounter (HOSPITAL_COMMUNITY): Payer: Self-pay | Admitting: *Deleted

## 2014-02-06 ENCOUNTER — Encounter (HOSPITAL_COMMUNITY)
Admission: EM | Disposition: A | Discharge status: Home / Self Care | Payer: Self-pay | Source: Home / Self Care | Attending: Internal Medicine

## 2014-02-06 HISTORY — PX: ESOPHAGOGASTRODUODENOSCOPY: SHX5428

## 2014-02-06 SURGERY — EGD (ESOPHAGOGASTRODUODENOSCOPY)
Anesthesia: Moderate Sedation

## 2014-02-06 MED ORDER — MIDAZOLAM HCL 10 MG/2ML IJ SOLN
INTRAMUSCULAR | Status: DC | PRN
  Administered 2014-02-06 (×5): 2 mg via INTRAVENOUS

## 2014-02-06 MED ORDER — SODIUM CHLORIDE 0.9 % IV SOLN: INTRAVENOUS | Status: DC

## 2014-02-06 MED ORDER — LORAZEPAM 2 MG/ML IJ SOLN
1.0000 mg | Freq: Four times a day (QID) | INTRAMUSCULAR | Status: DC | PRN
  Administered 2014-02-06 – 2014-02-08 (×2): 1 mg via INTRAVENOUS
  Filled 2014-02-06 (×2): qty 1

## 2014-02-06 MED ORDER — MIDAZOLAM HCL 10 MG/2ML IJ SOLN
INTRAMUSCULAR | Status: AC
  Filled 2014-02-06: qty 2

## 2014-02-06 MED ORDER — FENTANYL CITRATE 0.05 MG/ML IJ SOLN
INTRAMUSCULAR | Status: DC | PRN
  Administered 2014-02-06 (×4): 25 ug via INTRAVENOUS

## 2014-02-06 MED ORDER — FENTANYL CITRATE 0.05 MG/ML IJ SOLN
INTRAMUSCULAR | Status: AC
  Filled 2014-02-06: qty 2

## 2014-02-06 MED ORDER — BUTAMBEN-TETRACAINE-BENZOCAINE 2-2-14 % EX AERO
INHALATION_SPRAY | CUTANEOUS | Status: DC | PRN
  Administered 2014-02-06: 2 via TOPICAL

## 2014-02-06 NOTE — Interval H&P Note (Signed)
History and Physical Interval Note:  02/06/2014 12:46 PM  Jamie Allen  has presented today for surgery, with the diagnosis of N+V  The various methods of treatment have been discussed with the patient and family. After consideration of risks, benefits and other options for treatment, the patient has consented to  Procedure(s): ESOPHAGOGASTRODUODENOSCOPY (EGD) (N/A) as a surgical intervention .  The patient's history has been reviewed, patient examined, no change in status, stable for surgery.  I have reviewed the patient's chart and labs.  Questions were answered to the patient's satisfaction.     Winfield Cunas.

## 2014-02-06 NOTE — Progress Notes (Signed)
Pt is better after the narcotic pain meds were stopped w/o vomiting but still some nausea. NPO for EGD and will do at 12 n. Discussed with the pt and she is agreeable.
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
## 2014-02-06 NOTE — Op Note (Signed)
[facility] [place], [address]   ENDOSCOPY PROCEDURE REPORT  PATIENT: Jamie Allen, Jamie Allen  MR#: [id number] BIRTHDATE: 1971/09/15 , 42  yrs. old GENDER: Female ENDOSCOPIST:Zosia Lucchese Oletta Lamas, MD REFERRED BY:  [facility]. Primary G.I.: Dr. Michail Sermon PROCEDURE DATE:  02/06/2014 PROCEDURE:   EGD and BIOPSIES ASA CLASS:  class 2 INDICATIONS:   Nausea and Vomiting MEDICATION:   fentanyl 100 mcg, versed 10 mg IV TOPICAL ANESTHETIC:    cetacaine spray  DESCRIPTION OF PROCEDURE:   The Pentax upper scope was inserted blindly into the esophagus with swallowing. The esophagus was completely normal with no ulceration or esophagitis. The stomach was hundred and was examined in the forward and retroflex view. In the antrum area was fairly March when you're gastritis. Several biopsies were obtained. There were no ulcerations. The pyloric channel was widely patent. The duodenal bulb and 2nd duodenum were normal. The scope was withdrawn in the initial findings were confirmed. There were no immediate complications.     COMPLICATIONS: None  ENDOSCOPIC IMPRESSION: 1. Antral Gastritis. Biopsies obtained not clear if this is related to her nausea and vomiting 2. Nausea and Vomiting. No clear etiology on EGD  RECOMMENDATIONS: 1. would continue PPI therapy and slowly advance her diet. Will follow clinically    _______________________________ eSignedLaurence Spates, MD 02/06/2014 1:18 PM

## 2014-02-06 NOTE — Progress Notes (Signed)
[facility] PROGRESS NOTE  Jamie Allen NAT:[id number] DOB: 1971-05-17 DOA: 02/02/2014 PCP: Lorayne Marek, MD  Assessment/Plan: 43 y/o female with PMH of HTN, GERD, liver cirrhosis, NASH, alcoholism who recently Dx with c diff could not tolerated Po vanc presented with nausea, vomiting, diarrhea   1. C diff ? Persistent; patient was not taking PO vanc   -improved on IV flagyl; cont florastor   2. Persistent vomiting, nausea ? Etiology ? Related to NASH;   -CT abd (3/29): Hepatomegaly and hepatosteatosis, no colitis; Normal gastric emptying study on 12/2013 -s/p colonoscopy (01/2014): polyps, but no colitis  -nausea improved no vomiting >24 hrs; d/c opioids; advance diet  -appreciate GI eval; possible EGD 4/20; cont antiemetics, IVF  3. Elevated liver enzymes with NASH, cirrhosis ; +alcohol use; AST>ALT  -monitor LFTs improving; stop drinking alcohol    4. Anxiety, prn ativan   Code Status: full Family Communication: d/w patient (indicate person spoken with, relationship, and if by phone, the number) Disposition Plan: home 24-48 hours    Consultants:  GI  Procedures:  none  Antibiotics:  metronidazol 417<<<<   (indicate start date, and stop date if known)  HPI/Subjective: alert  Objective: Filed Vitals:   02/06/14 0610  BP: 130/87  Pulse:   Temp: 98.2 F (36.8 C)  Resp: 18    Intake/Output Summary (Last 24 hours) at 02/06/14 0852 Last data filed at 02/05/14 2200  Gross per 24 hour  Intake 1679.17 ml  Output      0 ml  Net 1679.17 ml   Filed Weights   02/02/14 1519 02/03/14 0142  Weight: 51.88 kg (114 lb 6 oz) 51.88 kg (114 lb 6 oz)    Exam:   General:  alert  Cardiovascular: s1,s2 rrr  Respiratory: CTA BL  Abdomen: soft, mild tender, no rebound   Musculoskeletal: no LE edema   Data Reviewed: Basic Metabolic Panel:  Recent Labs Lab 02/02/14 1530 02/03/14 0445 02/04/14 0537  NA 135* 139 140  K 3.4* 3.7 3.4*  CL 97 99 104   CO2 24 27 23   GLUCOSE 120* 104* 108*  BUN 5* 3* <3*  CREATININE 0.58 0.55 0.55  CALCIUM 8.7 7.8* 7.8*  MG  --  0.8*  --   PHOS  --  4.3  --    Liver Function Tests:  Recent Labs Lab 02/02/14 1530 02/03/14 0445 02/04/14 0537  AST 253* 193* 163*  ALT 98* 81* 62*  ALKPHOS 190* 176* 156*  BILITOT 0.6 1.2 1.5*  PROT 6.9 6.7 6.0  ALBUMIN 3.9 3.9 3.1*    Recent Labs Lab 02/02/14 1530  LIPASE 60*   No results found for this basename: AMMONIA,  in the last 168 hours CBC:  Recent Labs Lab 02/02/14 1530  WBC 4.8  NEUTROABS 3.4  HGB 13.8  HCT 38.5  MCV 101.6*  PLT 198   Cardiac Enzymes: No results found for this basename: CKTOTAL, CKMB, CKMBINDEX, TROPONINI,  in the last 168 hours BNP (last 3 results) No results found for this basename: PROBNP,  in the last 8760 hours CBG: No results found for this basename: GLUCAP,  in the last 168 hours  Recent Results (from the past 240 hour(s))  CLOSTRIDIUM DIFFICILE BY PCR     Status: None   Collection Time    02/02/14 11:43 PM      Result Value Ref Range Status   C difficile by pcr NEGATIVE  NEGATIVE Final   Comment: Performed at [facility]  Studies: No results found.  Scheduled Meds: . feeding supplement (RESOURCE BREEZE)  1 Container Oral TID BM  . folic acid  1 mg Oral Daily  . heparin  5,000 Units Subcutaneous 3 times per day  . metronidazole  500 mg Intravenous Q8H  . multivitamin with minerals  1 tablet Oral Daily  . nicotine  21 mg Transdermal Daily  . pantoprazole  40 mg Oral Daily  . saccharomyces boulardii  250 mg Oral BID   Continuous Infusions: . 0.9 % NaCl with KCl 20 mEq / L 50 mL/hr at 02/06/14 2440    Active Problems:   Persistent vomiting   Elevated liver enzymes   C. difficile diarrhea    Time spent: >35 minutes     Kinnie Feil  [facility] Pager [phone]. If 7PM-7AM, please contact night-coverage at [email or web address], password [facility] 02/06/2014, 8:52 AM  LOS: 4 days

## 2014-02-07 ENCOUNTER — Encounter (HOSPITAL_COMMUNITY): Payer: Self-pay | Admitting: Gastroenterology

## 2014-02-07 LAB — CLOSTRIDIUM DIFFICILE BY PCR: Toxigenic C. Difficile by PCR: NEGATIVE

## 2014-02-07 MED ORDER — PANTOPRAZOLE SODIUM 20 MG PO TBEC
20.0000 mg | DELAYED_RELEASE_TABLET | Freq: Two times a day (BID) | ORAL | Status: DC
  Administered 2014-02-08: 20 mg via ORAL
  Filled 2014-02-07 (×4): qty 1

## 2014-02-07 MED ORDER — ZOLPIDEM TARTRATE 5 MG PO TABS
5.0000 mg | ORAL_TABLET | Freq: Every evening | ORAL | Status: DC | PRN
  Administered 2014-02-07: 5 mg via ORAL
  Filled 2014-02-07: qty 1

## 2014-02-07 MED ORDER — PANTOPRAZOLE SODIUM 40 MG PO TBEC: 40.0000 mg | DELAYED_RELEASE_TABLET | Freq: Two times a day (BID) | ORAL | Status: DC

## 2014-02-07 NOTE — Progress Notes (Signed)
[facility] PROGRESS NOTE Subjective No N+V abd cramping and diarrheas started yesterday. Watery BM Q1-2 hours.  Objective: Vital signs in last 24 hours: Temp:  [97.4 F (36.3 C)-98.5 F (36.9 C)] 98.1 F (36.7 C) (04/21 0556) Pulse Rate:  [76-106] 82 (04/21 0556) Resp:  [16-30] 18 (04/21 0556) BP: (120-151)/(73-112) 147/96 mmHg (04/21 0556) SpO2:  [95 %-100 %] 99 % (04/21 0556) Last BM Date: 02/06/14  Intake/Output from previous day: 04/20 0701 - 04/21 0700 In: 2100 [I.V.:1600; IV Piggyback:500] Out: -  Intake/Output this shift:    PE:  Abdomen--nontender  Lab Results: No results found for this basename: WBC, HGB, HCT, PLT,  in the last 72 hours BMET No results found for this basename: NA, K, CL, CO2, CREATININE,  in the last 72 hours LFT No results found for this basename: PROT, AST, ALT, ALKPHOS, BILITOT, BILIDIR, IBILI,  in the last 72 hours PT/INR No results found for this basename: LABPROT, INR,  in the last 72 hours PANCREAS No results found for this basename: LIPASE,  in the last 72 hours       Studies/Results: No results found.  Medications: I have reviewed the patient's current medications.  Assessment/Plan: 1. N+V. Better off narcotics. Mild gastritis only on EGD. Would continue PPI and soft diet. 2. Diarrhea. C diff was negative 4/16. Started fairly suddenly yesterday. Will recheck C diff, if positive could probably restart vanco at low dose since the N+V  Is better.   Winfield Cunas. 02/07/2014, 8:20 AM

## 2014-02-07 NOTE — Progress Notes (Signed)
[facility] PROGRESS NOTE  Zandrea Kenealy ZOX:[id number] DOB: [birth year] DOA: 02/02/2014 PCP: Lorayne Marek, MD  Assessment/Plan: 43 y/o female with PMH of HTN, GERD, liver cirrhosis, NASH, alcoholism who recently Dx with c diff could not tolerated Po vanc presented with nausea, vomiting, diarrhea   1. C diff ? Persistent; patient was not taking PO vanc   -initially improved on IV flagyl; florastor  -4/21 diarrhea again; c diff neg 4/216; will recheck; appreciate GI eval   2. Persistent vomiting, nausea ? Etiology ? Related to NASH;   -CT abd (3/29): Hepatomegaly and hepatosteatosis, no colitis; Normal gastric emptying study on 12/2013 -s/p colonoscopy (01/2014): polyps, but no colitis  -nausea improved no vomiting >24 hrs with d/c opioids; advance diet  -4/20: s/p EGD: gastritis;  cont antiemetics, PPI: IVF  3. Elevated liver enzymes with NASH, cirrhosis ; +alcohol use; AST>ALT  -monitor LFTs improving; stop drinking alcohol    4. Anxiety, prn ativan   Code Status: full Family Communication: d/w patient (indicate person spoken with, relationship, and if by phone, the number) Disposition Plan: home 24-48 hours    Consultants:  GI  Procedures:  none  Antibiotics:  metronidazol 417<<<<   (indicate start date, and stop date if known)  HPI/Subjective: alert  Objective: Filed Vitals:   02/07/14 0556  BP: 147/96  Pulse: 82  Temp: 98.1 F (36.7 C)  Resp: 18    Intake/Output Summary (Last 24 hours) at 02/07/14 1016 Last data filed at 02/07/14 0600  Gross per 24 hour  Intake   1500 ml  Output      0 ml  Net   1500 ml   Filed Weights   02/02/14 1519 02/03/14 0142  Weight: 51.88 kg (114 lb 6 oz) 51.88 kg (114 lb 6 oz)    Exam:   General:  alert  Cardiovascular: s1,s2 rrr  Respiratory: CTA BL  Abdomen: soft, mild tender, no rebound   Musculoskeletal: no LE edema   Data Reviewed: Basic Metabolic Panel:  Recent Labs Lab 02/02/14 1530  02/03/14 0445 02/04/14 0537  NA 135* 139 140  K 3.4* 3.7 3.4*  CL 97 99 104  CO2 24 27 23   GLUCOSE 120* 104* 108*  BUN 5* 3* <3*  CREATININE 0.58 0.55 0.55  CALCIUM 8.7 7.8* 7.8*  MG  --  0.8*  --   PHOS  --  4.3  --    Liver Function Tests:  Recent Labs Lab 02/02/14 1530 02/03/14 0445 02/04/14 0537  AST 253* 193* 163*  ALT 98* 81* 62*  ALKPHOS 190* 176* 156*  BILITOT 0.6 1.2 1.5*  PROT 6.9 6.7 6.0  ALBUMIN 3.9 3.9 3.1*    Recent Labs Lab 02/02/14 1530  LIPASE 60*   No results found for this basename: AMMONIA,  in the last 168 hours CBC:  Recent Labs Lab 02/02/14 1530  WBC 4.8  NEUTROABS 3.4  HGB 13.8  HCT 38.5  MCV 101.6*  PLT 198   Cardiac Enzymes: No results found for this basename: CKTOTAL, CKMB, CKMBINDEX, TROPONINI,  in the last 168 hours BNP (last 3 results) No results found for this basename: PROBNP,  in the last 8760 hours CBG: No results found for this basename: GLUCAP,  in the last 168 hours  Recent Results (from the past 240 hour(s))  CLOSTRIDIUM DIFFICILE BY PCR     Status: None   Collection Time    02/02/14 11:43 PM      Result Value Ref Range Status  C difficile by pcr NEGATIVE  NEGATIVE Final   Comment: Performed at [facility]     Studies: No results found.  Scheduled Meds: . feeding supplement (RESOURCE BREEZE)  1 Container Oral TID BM  . folic acid  1 mg Oral Daily  . heparin  5,000 Units Subcutaneous 3 times per day  . metronidazole  500 mg Intravenous Q8H  . multivitamin with minerals  1 tablet Oral Daily  . nicotine  21 mg Transdermal Daily  . pantoprazole  40 mg Oral Daily  . saccharomyces boulardii  250 mg Oral BID   Continuous Infusions: . 0.9 % NaCl with KCl 20 mEq / L 50 mL/hr at 02/07/14 0600    Active Problems:   Persistent vomiting   Elevated liver enzymes   C. difficile diarrhea    Time spent: >35 minutes     Kinnie Feil  [facility] Pager [phone]. If 7PM-7AM, please  contact night-coverage at [email or web address], password [facility] 02/07/2014, 10:16 AM  LOS: 5 days

## 2014-02-07 NOTE — Progress Notes (Signed)
Patient expressed desire to go home today but stated not sure her husband would be ready for her to come home.  I offered to phone Dr. Daleen Bo and ask but she declined

## 2014-02-07 NOTE — Progress Notes (Signed)
Patient is requesting doctor be called to change her diet.  She does not like eating the dysphagia diet and states she eats regular food at home.  Dr, Daleen Bo paged

## 2014-02-07 NOTE — Progress Notes (Addendum)
Patient and her spouse have asked to be discharged home today since tests are negative.  I phoned Dr. Daleen Bo to tell him of their request . He came to see patient and both patient and spouse told him they did not want her to go home.

## 2014-02-08 DIAGNOSIS — R197 Diarrhea, unspecified: Secondary | ICD-10-CM

## 2014-02-08 DIAGNOSIS — K7689 Other specified diseases of liver: Secondary | ICD-10-CM

## 2014-02-08 LAB — COMPREHENSIVE METABOLIC PANEL
ALK PHOS: 125 U/L — AB (ref 39–117)
ALT: 41 U/L — AB (ref 0–35)
AST: 56 U/L — ABNORMAL HIGH (ref 0–37)
Albumin: 3.1 g/dL — ABNORMAL LOW (ref 3.5–5.2)
BUN: 5 mg/dL — AB (ref 6–23)
CALCIUM: 8.8 mg/dL (ref 8.4–10.5)
CO2: 19 meq/L (ref 19–32)
Chloride: 109 mEq/L (ref 96–112)
Creatinine, Ser: 0.62 mg/dL (ref 0.50–1.10)
Glucose, Bld: 101 mg/dL — ABNORMAL HIGH (ref 70–99)
Potassium: 3.4 mEq/L — ABNORMAL LOW (ref 3.7–5.3)
Sodium: 140 mEq/L (ref 137–147)
TOTAL PROTEIN: 6 g/dL (ref 6.0–8.3)
Total Bilirubin: <0.2 mg/dL — ABNORMAL LOW (ref 0.3–1.2)

## 2014-02-08 MED ORDER — PANTOPRAZOLE SODIUM 40 MG PO TBEC
40.0000 mg | DELAYED_RELEASE_TABLET | Freq: Every day | ORAL | Status: DC
Start: 2014-02-08 — End: 2014-04-06

## 2014-02-08 MED ORDER — FAMOTIDINE 20 MG PO TABS
20.0000 mg | ORAL_TABLET | Freq: Two times a day (BID) | ORAL | Status: DC
  Administered 2014-02-08: 20 mg via ORAL
  Filled 2014-02-08 (×2): qty 1

## 2014-02-08 MED ORDER — POTASSIUM CHLORIDE CRYS ER 20 MEQ PO TBCR
40.0000 meq | EXTENDED_RELEASE_TABLET | ORAL | Status: AC
  Administered 2014-02-08 (×2): 40 meq via ORAL
  Filled 2014-02-08 (×2): qty 2

## 2014-02-08 NOTE — Discharge Summary (Signed)
Physician Discharge Summary  Jamie Allen BHA:[id number] DOB: [birth year] DOA: 02/02/2014  PCP: Lorayne Marek, MD  Admit date: 02/02/2014 Discharge date: 02/08/2014  Time spent: >45 minutes  Recommendations for Outpatient Follow-up:  1. F/u of diarrhea  Discharge Diagnoses:  Active Problems: Vomiting and diarrhea NASH    Discharge Condition: stable  Diet recommendation: heart healthy  Filed Weights   02/02/14 1519 02/03/14 0142  Weight: 51.88 kg (114 lb 6 oz) 51.88 kg (114 lb 6 oz)    History of present illness:  43 y/o female with a recent hospitalization for C. Difficile. Was evaluated by Dr. Michail Sermon and had colonoscopy 01/18/2014 which he reported did not show any colitis but did have polyps for which biopsies were obtained. Patient was discharged on oral vancomycin but she reports that every time she took medication it made her feel nauseous and as such she was only able to keep down one of the 4 daily doses recommended.  She returns to the hospital for new vomiting. States that her diarrhea had not resolved.  While in the ED the patient's white blood cell count was within normal limits at 4.8 but her liver enzymes were worse when compared to last values and her AST is 253 and her ALT is 98.   Hospital Course:  Vomiting - underwent EGD on 4/ 20 which revealed only antral gastritis. Recommended to cont PPI as outpt.  - vomiting resolved- claims she is not tolerating solids.   Diarrhea - c. Diff negative on 4/16 and again at this admission  - d/c Flagyl - Interestingly pt told me this morning that she was having about 30 epidoses of pure watery stools but now tells me that she had a solid BM. The patient's nurse confirms this therefore, I will allow her to discharge home today.   Elevated LFTs - AST > ALT - ALT improved to 56 today - pt claims she does not drink  - hepatitis panel negative   Anxiety    Procedures:  4/20  EGD  Consultations:  GI  Discharge Exam: Filed Vitals:   02/08/14 1402  BP: 134/72  Pulse: 97  Temp: 98.7 F (37.1 C)  Resp: 18   General: alert  Cardiovascular: s1,s2 rrr  Respiratory: CTA BL  Abdomen: soft, mild tender, no rebound  Musculoskeletal: no LE edema    Discharge Instructions You were cared for by a hospitalist during your hospital stay. If you have any questions about your discharge medications or the care you received while you were in the hospital after you are discharged, you can call the unit and asked to speak with the hospitalist on call if the hospitalist that took care of you is not available. Once you are discharged, your primary care physician will handle any further medical issues. Please note that NO REFILLS for any discharge medications will be authorized once you are discharged, as it is imperative that you return to your primary care physician (or establish a relationship with a primary care physician if you do not have one) for your aftercare needs so that they can reassess your need for medications and monitor your lab values.     Medication List    STOP taking these medications       vancomycin 250 MG capsule  Commonly known as:  VANCOCIN      TAKE these medications       diphenhydrAMINE 25 mg capsule  Commonly known as:  BENADRYL  Take 25 mg by mouth daily  as needed for allergies.     nicotine 21 mg/24hr patch  Commonly known as:  NICODERM CQ - dosed in mg/24 hours  Place 21 mg onto the skin daily.     pantoprazole 40 MG tablet  Commonly known as:  PROTONIX  Take 1 tablet (40 mg total) by mouth daily. Switch for any other PPI at similar dose and frequency     saccharomyces boulardii 250 MG capsule  Commonly known as:  FLORASTOR  Take 1 capsule (250 mg total) by mouth 2 (two) times daily.       Allergies  Allergen Reactions  . Iohexol Hives, Itching and Swelling  . Morphine And Related Other (See Comments)    unknown  .  Oxycodone Itching      The results of significant diagnostics from this hospitalization (including imaging, microbiology, ancillary and laboratory) are listed below for reference.    Significant Diagnostic Studies: Ct Abdomen Pelvis Wo Contrast  01/15/2014   CLINICAL DATA:  Colitis.  C difficile colitis.  Recurrent symptoms.  EXAM: CT ABDOMEN AND PELVIS WITHOUT CONTRAST  TECHNIQUE: Multidetector CT imaging of the abdomen and pelvis was performed following the standard protocol without intravenous contrast.  COMPARISON:  CT ABD/PELV WO CM dated 01/05/2014; DG ABD 2 VIEWS dated 12/21/2013; NM GASTRIC EMPTYING dated 12/20/2013  FINDINGS: Lung Bases: Clear.  Liver: Unenhanced CT was performed per clinician order. Lack of IV contrast limits sensitivity and specificity, especially for evaluation of abdominal/pelvic solid viscera. Fatty liver with hepatomegaly.  Spleen:  Normal.  Gallbladder: Hydropic gallbladder. This may be secondary to fasting state. Focal fatty sparing adjacent to the gallbladder fossa.  Common bile duct:  Normal.  Pancreas:  Normal.  Adrenal glands:  Normal bilaterally.  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 No acute renal abnormality. The ureters appear within normal limits.  Stomach:  Normal.  Small bowel: Duodenum and small bowel appears normal. No mesenteric adenopathy.  Colon: Normal appendix. Contrast opacifies the colon with fluid levels in the distal colon. Sigmoid is decompressed. Rectum appears normal. No discrete mural thickening is present.  Pelvic Genitourinary:  Normal.  Bones:  Normal.  Vasculature: Grossly normal.  Body Wall: Fat containing periumbilical hernia.  IMPRESSION: 1. No colonic mural thickening to suggest colitis. 2. Hepatomegaly and hepatosteatosis. 3. Hydropic gallbladder without calcified stones. No inflammatory changes by noncontrast CT to suggest acute cholecystitis. This is most commonly secondary to  fasting state. 4. Stable left renal scarring and right renal compensatory hypertrophy.   Electronically Signed   By: Dereck Ligas M.D.   On: 01/15/2014 19:04   Dg Abd Acute W/chest  02/02/2014   CLINICAL DATA:  Diarrhea and abdomen pain  EXAM: ACUTE ABDOMEN SERIES (ABDOMEN 2 VIEW & CHEST 1 VIEW)  COMPARISON:  None.  FINDINGS: There is no evidence of dilated bowel loops or free intraperitoneal air. No radiopaque calculi or other significant radiographic abnormality is seen. Heart size and mediastinal contours are within normal limits. Both lungs are clear.  IMPRESSION: Negative abdominal radiographs.  No acute cardiopulmonary disease.   Electronically Signed   By: Abelardo Diesel M.D.   On: 02/02/2014 16:25   US Abdomen Limited Ruq  02/03/2014   CLINICAL DATA:  Elevated LFTs  EXAM: US ABDOMEN LIMITED - RIGHT UPPER QUADRANT  COMPARISON:  01/15/2014  FINDINGS: Gallbladder:  No gallstones or wall thickening visualized. No sonographic Murphy sign noted.  Common bile duct:  Diameter: 5 mm  Liver:  Increase  in echogenicity consistent with fatty infiltration.  IMPRESSION: No acute abnormality noted.   Electronically Signed   By: Inez Catalina M.D.   On: 02/03/2014 09:11   US Abdomen Limited Ruq  01/15/2014   CLINICAL DATA:  Elevated liver function tests.  EXAM: US ABDOMEN LIMITED - RIGHT UPPER QUADRANT  COMPARISON:  None.  FINDINGS: Gallbladder:  No gallstones or wall thickening visualized. No sonographic Murphy sign noted.  Common bile duct:  Diameter: 5.3 mm proximally.  Liver:  Diffusely echogenic.  IMPRESSION: 1. Diffusely echogenic liver, most likely due to steatosis. Chronic hepatitis or cirrhosis can also produce this appearance. 2. Minimally prominent common duct. This could be normal for this patient or due to a nonvisualized distal common duct stone or stricture.   Electronically Signed   By: Enrique Sack M.D.   On: 01/15/2014 18:44    Microbiology: Recent Results (from the past 240 hour(s))   CLOSTRIDIUM DIFFICILE BY PCR     Status: None   Collection Time    02/02/14 11:43 PM      Result Value Ref Range Status   C difficile by pcr NEGATIVE  NEGATIVE Final   Comment: Performed at [place] DIFFICILE BY PCR     Status: None   Collection Time    02/07/14  8:45 AM      Result Value Ref Range Status   C difficile by pcr NEGATIVE  NEGATIVE Final   Comment: Performed at [place]: Basic Metabolic Panel:  Recent Labs Lab 02/02/14 1530 02/03/14 0445 02/04/14 0537 02/08/14 0435  NA 135* 139 140 140  K 3.4* 3.7 3.4* 3.4*  CL 97 99 104 109  CO2 24 27 23 19   GLUCOSE 120* 104* 108* 101*  BUN 5* 3* <3* 5*  CREATININE 0.58 0.55 0.55 0.62  CALCIUM 8.7 7.8* 7.8* 8.8  MG  --  0.8*  --   --   PHOS  --  4.3  --   --    Liver Function Tests:  Recent Labs Lab 02/02/14 1530 02/03/14 0445 02/04/14 0537 02/08/14 0435  AST 253* 193* 163* 56*  ALT 98* 81* 62* 41*  ALKPHOS 190* 176* 156* 125*  BILITOT 0.6 1.2 1.5* <0.2*  PROT 6.9 6.7 6.0 6.0  ALBUMIN 3.9 3.9 3.1* 3.1*    Recent Labs Lab 02/02/14 1530  LIPASE 60*   No results found for this basename: AMMONIA,  in the last 168 hours CBC:  Recent Labs Lab 02/02/14 1530  WBC 4.8  NEUTROABS 3.4  HGB 13.8  HCT 38.5  MCV 101.6*  PLT 198   Cardiac Enzymes: No results found for this basename: CKTOTAL, CKMB, CKMBINDEX, TROPONINI,  in the last 168 hours BNP: BNP (last 3 results) No results found for this basename: PROBNP,  in the last 8760 hours CBG: No results found for this basename: GLUCAP,  in the last 168 hours     Signed:  Debbe Odea, MD [facility] 02/08/2014, 2:26 PM

## 2014-02-08 NOTE — Progress Notes (Addendum)
Patient discharge home with husband, alert and oriented, discharge instructions given, patient and husband verbalize understanding of orders given, My Chart access declined at this time, will access at home, patient in stable condition at this time

## 2014-02-08 NOTE — Progress Notes (Signed)
[facility] PROGRESS NOTE Subjective patient reports that she is tolerating diet without difficulty. Diarrhea is somewhat better. C. diff toxin was negative.  Objective: Vital signs in last 24 hours: Temp:  [97.6 F (36.4 C)-98.3 F (36.8 C)] 98.2 F (36.8 C) (04/22 0530) Pulse Rate:  [79-84] 79 (04/22 0530) Resp:  [18] 18 (04/22 0530) BP: (126-130)/(86-88) 130/86 mmHg (04/22 0530) SpO2:  [95 %-99 %] 95 % (04/22 0530) Last BM Date: 02/07/14  Intake/Output from previous day: 04/21 0701 - 04/22 0700 In: 1140 [P.O.:240; I.V.:900] Out: -  Intake/Output this shift:    PE:  Abdomen-- soft and nontender  Lab Results: No results found for this basename: WBC, HGB, HCT, PLT,  in the last 72 hours BMET  Recent Labs  02/08/14 0435  NA 140  K 3.4*  CL 109  CO2 19  CREATININE 0.62   LFT  Recent Labs  02/08/14 0435  PROT 6.0  AST 56*  ALT 41*  ALKPHOS 125*  BILITOT <0.2*   PT/INR No results found for this basename: LABPROT, INR,  in the last 72 hours PANCREAS No results found for this basename: LIPASE,  in the last 72 hours       Studies/Results: No results found.  Medications: I have reviewed the patient's current medications.  Assessment/Plan: 1. Nausea and vomiting. Appears to be improved after narcotics been stopped. Patient currently tolerated by 2. Diarrhea. C. diff again negative not really sure what's going on with this but probably can be followed is outpatient 3. Cirrhosis secondary to NASH.  Should be okay for discharge and follow-up with Dr. Michail Sermon in about 2 weeks.   Winfield Cunas. 02/08/2014, 8:35 AM

## 2014-02-20 ENCOUNTER — Encounter (HOSPITAL_COMMUNITY): Payer: Self-pay | Admitting: Certified Registered Nurse Anesthetist

## 2014-02-20 ENCOUNTER — Encounter (HOSPITAL_COMMUNITY): Payer: Self-pay | Admitting: Gastroenterology

## 2014-02-20 NOTE — Transfer of Care (Addendum)
Immediate Anesthesia Transfer of Care Note  Patient: Jamie Allen  Procedure(s) Performed: Procedure(s): COLONOSCOPY WITH PROPOFOL (N/A)  Patient Location: PACU  Anesthesia Type:MAC  Level of Consciousness: awake, alert  and patient cooperative  Airway & Oxygen Therapy: Patient Spontanous Breathing and Patient connected to face mask oxygen  Post-op Assessment: Report given to PACU RN  Post vital signs: stable  Complications: No apparent anesthesia complications

## 2014-02-20 NOTE — Anesthesia Preprocedure Evaluation (Deleted)
Anesthesia Evaluation    Airway        Dental   Pulmonary Current Smoker,           Cardiovascular hypertension,      Neuro/Psych    GI/Hepatic   Endo/Other    Renal/GU      Musculoskeletal   Abdominal   Peds  Hematology   Anesthesia Other Findings   Reproductive/Obstetrics                             Anesthesia Physical Anesthesia Plan Anesthesia Quick Evaluation  

## 2014-02-21 NOTE — Anesthesia Postprocedure Evaluation (Signed)
  Anesthesia Post-op Note  Patient: Jamie Allen  Procedure(s) Performed: Procedure(s) (LRB): COLONOSCOPY WITH PROPOFOL (N/A)  Patient Location: PACU  Anesthesia Type: MAC  Level of Consciousness: awake and alert   Airway and Oxygen Therapy: Patient Spontanous Breathing  Post-op Pain: mild  Post-op Assessment: Post-op Vital signs reviewed, Patient's Cardiovascular Status Stable, Respiratory Function Stable, Patent Airway and No signs of Nausea or vomiting  Last Vitals:  Filed Vitals:   01/18/14 1537  BP: 115/81  Pulse:   Temp:   Resp: 21    Post-op Vital Signs: stable   Complications: No apparent anesthesia complications

## 2014-02-23 NOTE — Addendum Note (Signed)
Addendum created 02/23/14 1026 by Myrtie Soman, MD   Modules edited: Notes Section   Notes Section:  Delete: [id number]; File: [id number]

## 2014-02-23 NOTE — Anesthesia Postprocedure Evaluation (Deleted)
  Anesthesia Post-op Note  Patient: Jamie Allen  Procedure(s) Performed: Procedure(s) (LRB): COLONOSCOPY WITH PROPOFOL (N/A)  Patient Location: PACU  Anesthesia Type: MAC  Level of Consciousness: awake and alert   Airway and Oxygen Therapy: Patient Spontanous Breathing  Post-op Pain: mild  Post-op Assessment: Post-op Vital signs reviewed, Patient's Cardiovascular Status Stable, Respiratory Function Stable, Patent Airway and No signs of Nausea or vomiting  Last Vitals:  Filed Vitals:   01/18/14 1537  BP: 115/81  Pulse:   Temp:   Resp: 21    Post-op Vital Signs: stable   Complications: No apparent anesthesia complications 

## 2014-03-27 ENCOUNTER — Emergency Department (HOSPITAL_BASED_OUTPATIENT_CLINIC_OR_DEPARTMENT_OTHER): Payer: No Typology Code available for payment source

## 2014-03-27 ENCOUNTER — Emergency Department (HOSPITAL_BASED_OUTPATIENT_CLINIC_OR_DEPARTMENT_OTHER)
Admission: EM | Admit: 2014-03-27 | Discharge: 2014-03-27 | Disposition: A | Discharge status: Home / Self Care | Payer: No Typology Code available for payment source | Attending: Emergency Medicine | Admitting: Emergency Medicine

## 2014-03-27 ENCOUNTER — Encounter (HOSPITAL_BASED_OUTPATIENT_CLINIC_OR_DEPARTMENT_OTHER): Payer: Self-pay | Admitting: Emergency Medicine

## 2014-03-27 DIAGNOSIS — I10 Essential (primary) hypertension: Secondary | ICD-10-CM | POA: Insufficient documentation

## 2014-03-27 DIAGNOSIS — Z862 Personal history of diseases of the blood and blood-forming organs and certain disorders involving the immune mechanism: Secondary | ICD-10-CM | POA: Insufficient documentation

## 2014-03-27 DIAGNOSIS — F17200 Nicotine dependence, unspecified, uncomplicated: Secondary | ICD-10-CM | POA: Insufficient documentation

## 2014-03-27 DIAGNOSIS — Z3202 Encounter for pregnancy test, result negative: Secondary | ICD-10-CM | POA: Insufficient documentation

## 2014-03-27 DIAGNOSIS — Z8659 Personal history of other mental and behavioral disorders: Secondary | ICD-10-CM | POA: Insufficient documentation

## 2014-03-27 DIAGNOSIS — R197 Diarrhea, unspecified: Secondary | ICD-10-CM | POA: Insufficient documentation

## 2014-03-27 DIAGNOSIS — Z79899 Other long term (current) drug therapy: Secondary | ICD-10-CM | POA: Insufficient documentation

## 2014-03-27 DIAGNOSIS — R112 Nausea with vomiting, unspecified: Secondary | ICD-10-CM | POA: Insufficient documentation

## 2014-03-27 DIAGNOSIS — Z8639 Personal history of other endocrine, nutritional and metabolic disease: Secondary | ICD-10-CM | POA: Insufficient documentation

## 2014-03-27 DIAGNOSIS — K219 Gastro-esophageal reflux disease without esophagitis: Secondary | ICD-10-CM | POA: Insufficient documentation

## 2014-03-27 LAB — URINALYSIS, ROUTINE W REFLEX MICROSCOPIC
BILIRUBIN URINE: NEGATIVE
Glucose, UA: NEGATIVE mg/dL
Hgb urine dipstick: NEGATIVE
KETONES UR: NEGATIVE mg/dL
Leukocytes, UA: NEGATIVE
Nitrite: NEGATIVE
PH: 7.5 (ref 5.0–8.0)
Protein, ur: NEGATIVE mg/dL
SPECIFIC GRAVITY, URINE: 1.014 (ref 1.005–1.030)
UROBILINOGEN UA: 0.2 mg/dL (ref 0.0–1.0)

## 2014-03-27 LAB — CBC WITH DIFFERENTIAL/PLATELET
BASOS ABS: 0 10*3/uL (ref 0.0–0.1)
Basophils Relative: 1 % (ref 0–1)
EOS ABS: 0.1 10*3/uL (ref 0.0–0.7)
Eosinophils Relative: 1 % (ref 0–5)
HCT: 44.9 % (ref 36.0–46.0)
Hemoglobin: 16.3 g/dL — ABNORMAL HIGH (ref 12.0–15.0)
Lymphocytes Relative: 21 % (ref 12–46)
Lymphs Abs: 1.8 10*3/uL (ref 0.7–4.0)
MCH: 37.6 pg — AB (ref 26.0–34.0)
MCHC: 36.3 g/dL — AB (ref 30.0–36.0)
MCV: 103.5 fL — ABNORMAL HIGH (ref 78.0–100.0)
Monocytes Absolute: 0.3 10*3/uL (ref 0.1–1.0)
Monocytes Relative: 4 % (ref 3–12)
NEUTROS ABS: 6.3 10*3/uL (ref 1.7–7.7)
Neutrophils Relative %: 74 % (ref 43–77)
PLATELETS: 256 10*3/uL (ref 150–400)
RBC: 4.34 MIL/uL (ref 3.87–5.11)
RDW: 16.6 % — ABNORMAL HIGH (ref 11.5–15.5)
WBC: 8.6 10*3/uL (ref 4.0–10.5)

## 2014-03-27 LAB — COMPREHENSIVE METABOLIC PANEL
ALBUMIN: 4.7 g/dL (ref 3.5–5.2)
ALT: 77 U/L — ABNORMAL HIGH (ref 0–35)
AST: 205 U/L — AB (ref 0–37)
Alkaline Phosphatase: 195 U/L — ABNORMAL HIGH (ref 39–117)
BUN: 3 mg/dL — ABNORMAL LOW (ref 6–23)
CHLORIDE: 94 meq/L — AB (ref 96–112)
CO2: 21 mEq/L (ref 19–32)
Calcium: 10.1 mg/dL (ref 8.4–10.5)
Creatinine, Ser: 0.8 mg/dL (ref 0.50–1.10)
GFR calc Af Amer: >90 mL/min (ref >90)
GFR calc non Af Amer: 89 mL/min — ABNORMAL LOW (ref >90)
Glucose, Bld: 119 mg/dL — ABNORMAL HIGH (ref 70–99)
POTASSIUM: 3.7 meq/L (ref 3.7–5.3)
SODIUM: 139 meq/L (ref 137–147)
TOTAL PROTEIN: 8.9 g/dL — AB (ref 6.0–8.3)
Total Bilirubin: 0.6 mg/dL (ref 0.3–1.2)

## 2014-03-27 LAB — PREGNANCY, URINE: Preg Test, Ur: NEGATIVE

## 2014-03-27 MED ORDER — DICYCLOMINE HCL 20 MG PO TABS: 20.0000 mg | ORAL_TABLET | Freq: Two times a day (BID) | ORAL | Status: DC

## 2014-03-27 MED ORDER — FENTANYL CITRATE 0.05 MG/ML IJ SOLN
50.0000 ug | Freq: Once | INTRAMUSCULAR | Status: AC
  Administered 2014-03-27: 50 ug via INTRAVENOUS
  Filled 2014-03-27: qty 2

## 2014-03-27 MED ORDER — SODIUM CHLORIDE 0.9 % IV BOLUS (SEPSIS)
1000.0000 mL | Freq: Once | INTRAVENOUS | Status: AC
  Administered 2014-03-27: 1000 mL via INTRAVENOUS

## 2014-03-27 MED ORDER — ONDANSETRON HCL 4 MG/2ML IJ SOLN
INTRAMUSCULAR | Status: AC
  Administered 2014-03-27: 4 mg via INTRAVENOUS
  Filled 2014-03-27: qty 2

## 2014-03-27 MED ORDER — IOHEXOL 300 MG/ML  SOLN: 50.0000 mL | Freq: Once | INTRAMUSCULAR | Status: DC | PRN

## 2014-03-27 MED ORDER — BARIUM SULFATE 2.1 % PO SUSP
450.0000 mL | ORAL | Status: AC
  Administered 2014-03-27: 450 mL via ORAL

## 2014-03-27 MED ORDER — ONDANSETRON HCL 4 MG PO TABS: 4.0000 mg | ORAL_TABLET | Freq: Four times a day (QID) | ORAL | Status: DC

## 2014-03-27 MED ORDER — ONDANSETRON HCL 4 MG/2ML IJ SOLN
4.0000 mg | Freq: Once | INTRAMUSCULAR | Status: AC
  Administered 2014-03-27: 4 mg via INTRAVENOUS

## 2014-03-27 MED ORDER — HYDROCODONE-ACETAMINOPHEN 5-325 MG PO TABS: 1.0000 | ORAL_TABLET | ORAL | Status: DC | PRN

## 2014-03-27 NOTE — ED Notes (Signed)
Lower abdominal pain. States pain feels like she felt when she had c diff.

## 2014-03-27 NOTE — Discharge Instructions (Signed)
Diarrhea Diarrhea is frequent loose and watery bowel movements. It can cause you to feel weak and dehydrated. Dehydration can cause you to become tired and thirsty, have a dry mouth, and have decreased urination that often is dark yellow. Diarrhea is a sign of another problem, most often an infection that will not last long. In most cases, diarrhea typically lasts 2 3 days. However, it can last longer if it is a sign of something more serious. It is important to treat your diarrhea as directed by your caregive to lessen or prevent future episodes of diarrhea. CAUSES  Some common causes include:  Gastrointestinal infections caused by viruses, bacteria, or parasites.  Food poisoning or food allergies.  Certain medicines, such as antibiotics, chemotherapy, and laxatives.  Artificial sweeteners and fructose.  Digestive disorders. HOME CARE INSTRUCTIONS  Ensure adequate fluid intake (hydration): have 1 cup (8 oz) of fluid for each diarrhea episode. Avoid fluids that contain simple sugars or sports drinks, fruit juices, whole milk products, and sodas. Your urine should be clear or pale yellow if you are drinking enough fluids. Hydrate with an oral rehydration solution that you can purchase at pharmacies, retail stores, and online. You can prepare an oral rehydration solution at home by mixing the following ingredients together:    tsp table salt.   tsp baking soda.   tsp salt substitute containing potassium chloride.  1  tablespoons sugar.  1 L (34 oz) of water.  Certain foods and beverages may increase the speed at which food moves through the gastrointestinal (GI) tract. These foods and beverages should be avoided and include:  Caffeinated and alcoholic beverages.  High-fiber foods, such as raw fruits and vegetables, nuts, seeds, and whole grain breads and cereals.  Foods and beverages sweetened with sugar alcohols, such as xylitol, sorbitol, and mannitol.  Some foods may be well  tolerated and may help thicken stool including:  Starchy foods, such as rice, toast, pasta, low-sugar cereal, oatmeal, grits, baked potatoes, crackers, and bagels.  Bananas.  Applesauce.  Add probiotic-rich foods to help increase healthy bacteria in the GI tract, such as yogurt and fermented milk products.  Wash your hands well after each diarrhea episode.  Only take over-the-counter or prescription medicines as directed by your caregiver.  Take a warm bath to relieve any burning or pain from frequent diarrhea episodes. SEEK IMMEDIATE MEDICAL CARE IF:   You are unable to keep fluids down.  You have persistent vomiting.  You have blood in your stool, or your stools are black and tarry.  You do not urinate in 6 8 hours, or there is only a small amount of very dark urine.  You have abdominal pain that increases or localizes.  You have weakness, dizziness, confusion, or lightheadedness.  You have a severe headache.  Your diarrhea gets worse or does not get better.  You have a fever or persistent symptoms for more than 2 3 days.  You have a fever and your symptoms suddenly get worse. MAKE SURE YOU:   Understand these instructions.  Will watch your condition.  Will get help right away if you are not doing well or get worse. Document Released: 09/26/2002 Document Revised: 09/22/2012 Document Reviewed: 06/13/2012 [facility] Patient Information 2014 [place], [place].  Nausea and Vomiting Nausea is a sick feeling that often comes before throwing up (vomiting). Vomiting is a reflex where stomach contents come out of your mouth. Vomiting can cause severe loss of body fluids (dehydration). Children and elderly adults can become  dehydrated quickly, especially if they also have diarrhea. Nausea and vomiting are symptoms of a condition or disease. It is important to find the cause of your symptoms. CAUSES   Direct irritation of the stomach lining. This irritation can result from  increased acid production (gastroesophageal reflux disease), infection, food poisoning, taking certain medicines (such as nonsteroidal anti-inflammatory drugs), alcohol use, or tobacco use.  Signals from the brain.These signals could be caused by a headache, heat exposure, an inner ear disturbance, increased pressure in the brain from injury, infection, a tumor, or a concussion, pain, emotional stimulus, or metabolic problems.  An obstruction in the gastrointestinal tract (bowel obstruction).  Illnesses such as diabetes, hepatitis, gallbladder problems, appendicitis, kidney problems, cancer, sepsis, atypical symptoms of a heart attack, or eating disorders.  Medical treatments such as chemotherapy and radiation.  Receiving medicine that makes you sleep (general anesthetic) during surgery. DIAGNOSIS Your caregiver may ask for tests to be done if the problems do not improve after a few days. Tests may also be done if symptoms are severe or if the reason for the nausea and vomiting is not clear. Tests may include:  Urine tests.  Blood tests.  Stool tests.  Cultures (to look for evidence of infection).  X-rays or other imaging studies. Test results can help your caregiver make decisions about treatment or the need for additional tests. TREATMENT You need to stay well hydrated. Drink frequently but in small amounts.You may wish to drink water, sports drinks, clear broth, or eat frozen ice pops or gelatin dessert to help stay hydrated.When you eat, eating slowly may help prevent nausea.There are also some antinausea medicines that may help prevent nausea. HOME CARE INSTRUCTIONS   Take all medicine as directed by your caregiver.  If you do not have an appetite, do not force yourself to eat. However, you must continue to drink fluids.  If you have an appetite, eat a normal diet unless your caregiver tells you differently.  Eat a variety of complex carbohydrates (rice, wheat, potatoes,  bread), lean meats, yogurt, fruits, and vegetables.  Avoid high-fat foods because they are more difficult to digest.  Drink enough water and fluids to keep your urine clear or pale yellow.  If you are dehydrated, ask your caregiver for specific rehydration instructions. Signs of dehydration may include:  Severe thirst.  Dry lips and mouth.  Dizziness.  Dark urine.  Decreasing urine frequency and amount.  Confusion.  Rapid breathing or pulse. SEEK IMMEDIATE MEDICAL CARE IF:   You have blood or brown flecks (like coffee grounds) in your vomit.  You have black or bloody stools.  You have a severe headache or stiff neck.  You are confused.  You have severe abdominal pain.  You have chest pain or trouble breathing.  You do not urinate at least once every 8 hours.  You develop cold or clammy skin.  You continue to vomit for longer than 24 to 48 hours.  You have a fever. MAKE SURE YOU:   Understand these instructions.  Will watch your condition.  Will get help right away if you are not doing well or get worse. Document Released: 10/06/2005 Document Revised: 12/29/2011 Document Reviewed: 03/05/2011 [facility] Patient Information 2014 [place], [place].
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
## 2014-03-28 LAB — CLOSTRIDIUM DIFFICILE BY PCR: Toxigenic C. Difficile by PCR: NEGATIVE

## 2014-03-31 LAB — STOOL CULTURE

## 2014-04-06 ENCOUNTER — Encounter (HOSPITAL_COMMUNITY): Payer: Self-pay | Admitting: Emergency Medicine

## 2014-04-06 ENCOUNTER — Emergency Department (HOSPITAL_COMMUNITY)
Admission: EM | Admit: 2014-04-06 | Discharge: 2014-04-06 | Disposition: A | Discharge status: Home / Self Care | Payer: No Typology Code available for payment source | Attending: Emergency Medicine | Admitting: Emergency Medicine

## 2014-04-06 DIAGNOSIS — Z9889 Other specified postprocedural states: Secondary | ICD-10-CM | POA: Insufficient documentation

## 2014-04-06 DIAGNOSIS — Z3202 Encounter for pregnancy test, result negative: Secondary | ICD-10-CM | POA: Insufficient documentation

## 2014-04-06 DIAGNOSIS — R7989 Other specified abnormal findings of blood chemistry: Secondary | ICD-10-CM | POA: Insufficient documentation

## 2014-04-06 DIAGNOSIS — R109 Unspecified abdominal pain: Secondary | ICD-10-CM

## 2014-04-06 DIAGNOSIS — IMO0002 Reserved for concepts with insufficient information to code with codable children: Secondary | ICD-10-CM | POA: Insufficient documentation

## 2014-04-06 DIAGNOSIS — Z862 Personal history of diseases of the blood and blood-forming organs and certain disorders involving the immune mechanism: Secondary | ICD-10-CM | POA: Insufficient documentation

## 2014-04-06 DIAGNOSIS — F411 Generalized anxiety disorder: Secondary | ICD-10-CM | POA: Insufficient documentation

## 2014-04-06 DIAGNOSIS — F17200 Nicotine dependence, unspecified, uncomplicated: Secondary | ICD-10-CM | POA: Insufficient documentation

## 2014-04-06 DIAGNOSIS — I10 Essential (primary) hypertension: Secondary | ICD-10-CM | POA: Insufficient documentation

## 2014-04-06 DIAGNOSIS — Z8639 Personal history of other endocrine, nutritional and metabolic disease: Secondary | ICD-10-CM | POA: Insufficient documentation

## 2014-04-06 DIAGNOSIS — K219 Gastro-esophageal reflux disease without esophagitis: Secondary | ICD-10-CM | POA: Insufficient documentation

## 2014-04-06 DIAGNOSIS — Z79899 Other long term (current) drug therapy: Secondary | ICD-10-CM | POA: Insufficient documentation

## 2014-04-06 DIAGNOSIS — Z8719 Personal history of other diseases of the digestive system: Secondary | ICD-10-CM | POA: Insufficient documentation

## 2014-04-06 DIAGNOSIS — R1084 Generalized abdominal pain: Secondary | ICD-10-CM | POA: Insufficient documentation

## 2014-04-06 LAB — URINALYSIS, ROUTINE W REFLEX MICROSCOPIC
Bilirubin Urine: NEGATIVE
Glucose, UA: NEGATIVE mg/dL
Hgb urine dipstick: NEGATIVE
Ketones, ur: NEGATIVE mg/dL
Leukocytes, UA: NEGATIVE
Nitrite: NEGATIVE
Protein, ur: NEGATIVE mg/dL
Specific Gravity, Urine: 1.008 (ref 1.005–1.030)
Urobilinogen, UA: 0.2 mg/dL (ref 0.0–1.0)
pH: 7 (ref 5.0–8.0)

## 2014-04-06 LAB — CBC WITH DIFFERENTIAL/PLATELET
Basophils Absolute: 0 10*3/uL (ref 0.0–0.1)
Basophils Relative: 0 % (ref 0–1)
Eosinophils Absolute: 0 10*3/uL (ref 0.0–0.7)
Eosinophils Relative: 1 % (ref 0–5)
HCT: 37.7 % (ref 36.0–46.0)
Hemoglobin: 13.3 g/dL (ref 12.0–15.0)
Lymphocytes Relative: 8 % — ABNORMAL LOW (ref 12–46)
Lymphs Abs: 0.6 10*3/uL — ABNORMAL LOW (ref 0.7–4.0)
MCH: 36.1 pg — ABNORMAL HIGH (ref 26.0–34.0)
MCHC: 35.3 g/dL (ref 30.0–36.0)
MCV: 102.4 fL — ABNORMAL HIGH (ref 78.0–100.0)
Monocytes Absolute: 0.3 10*3/uL (ref 0.1–1.0)
Monocytes Relative: 4 % (ref 3–12)
Neutro Abs: 6.3 10*3/uL (ref 1.7–7.7)
Neutrophils Relative %: 87 % — ABNORMAL HIGH (ref 43–77)
Platelets: 282 10*3/uL (ref 150–400)
RBC: 3.68 MIL/uL — ABNORMAL LOW (ref 3.87–5.11)
RDW: 18 % — ABNORMAL HIGH (ref 11.5–15.5)
WBC: 7.2 10*3/uL (ref 4.0–10.5)

## 2014-04-06 LAB — COMPREHENSIVE METABOLIC PANEL
ALT: 100 U/L — ABNORMAL HIGH (ref 0–35)
AST: 219 U/L — ABNORMAL HIGH (ref 0–37)
Albumin: 3.6 g/dL (ref 3.5–5.2)
Alkaline Phosphatase: 180 U/L — ABNORMAL HIGH (ref 39–117)
BUN: 4 mg/dL — ABNORMAL LOW (ref 6–23)
CO2: 22 mEq/L (ref 19–32)
Calcium: 9.1 mg/dL (ref 8.4–10.5)
Chloride: 93 mEq/L — ABNORMAL LOW (ref 96–112)
Creatinine, Ser: 0.68 mg/dL (ref 0.50–1.10)
GFR calc Af Amer: >90 mL/min (ref >90)
GFR calc non Af Amer: >90 mL/min (ref >90)
Glucose, Bld: 107 mg/dL — ABNORMAL HIGH (ref 70–99)
Potassium: 3.3 mEq/L — ABNORMAL LOW (ref 3.7–5.3)
Sodium: 136 mEq/L — ABNORMAL LOW (ref 137–147)
Total Bilirubin: 0.6 mg/dL (ref 0.3–1.2)
Total Protein: 7.1 g/dL (ref 6.0–8.3)

## 2014-04-06 LAB — LIPASE, BLOOD: Lipase: 34 U/L (ref 11–59)

## 2014-04-06 LAB — POC OCCULT BLOOD, ED: Fecal Occult Bld: NEGATIVE

## 2014-04-06 MED ORDER — HYDROMORPHONE HCL PF 1 MG/ML IJ SOLN
1.0000 mg | Freq: Once | INTRAMUSCULAR | Status: AC
  Administered 2014-04-06: 1 mg via INTRAVENOUS
  Filled 2014-04-06: qty 1

## 2014-04-06 MED ORDER — SODIUM CHLORIDE 0.9 % IV BOLUS (SEPSIS)
1000.0000 mL | Freq: Once | INTRAVENOUS | Status: AC
  Administered 2014-04-06: 1000 mL via INTRAVENOUS

## 2014-04-06 MED ORDER — ONDANSETRON HCL 4 MG/2ML IJ SOLN
4.0000 mg | Freq: Once | INTRAMUSCULAR | Status: AC
  Administered 2014-04-06: 4 mg via INTRAVENOUS
  Filled 2014-04-06: qty 2

## 2014-04-06 MED ORDER — HYDROMORPHONE HCL PF 1 MG/ML IJ SOLN
0.5000 mg | Freq: Once | INTRAMUSCULAR | Status: AC
  Administered 2014-04-06: 0.5 mg via INTRAVENOUS
  Filled 2014-04-06: qty 1

## 2014-04-06 MED ORDER — LORAZEPAM 2 MG/ML IJ SOLN
1.0000 mg | Freq: Once | INTRAMUSCULAR | Status: AC
  Administered 2014-04-06: 1 mg via INTRAVENOUS
  Filled 2014-04-06: qty 1

## 2014-04-06 NOTE — ED Notes (Signed)
Pt c/o lower right abd pain x 3 days. Pt was seen at [facility] told her intestines were inflamed. Pt states she hasn't eaten in 1 week and has been up for the past 3 days. Pt also states she has had blood in stool

## 2014-04-06 NOTE — Progress Notes (Signed)
[facility] CL spoke with patient about [employer]. CL scheduled patient an apt with pcp [facility] and [place] on 7/1 at 9:45 am.
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
## 2014-04-06 NOTE — Discharge Instructions (Signed)
Abdominal Pain, Women Abdominal (stomach, pelvic, or belly) pain can be caused by many things. It is important to tell your doctor:  The location of the pain.  Does it come and go or is it present all the time?  Are there things that start the pain (eating certain foods, exercise)?  Are there other symptoms associated with the pain (fever, nausea, vomiting, diarrhea)? All of this is helpful to know when trying to find the cause of the pain. CAUSES   Stomach: virus or bacteria infection, or ulcer.  Intestine: appendicitis (inflamed appendix), regional ileitis (Crohn's disease), ulcerative colitis (inflamed colon), irritable bowel syndrome, diverticulitis (inflamed diverticulum of the colon), or cancer of the stomach or intestine.  Gallbladder disease or stones in the gallbladder.  Kidney disease, kidney stones, or infection.  Pancreas infection or cancer.  Fibromyalgia (pain disorder).  Diseases of the female organs:  Uterus: fibroid (non-cancerous) tumors or infection.  Fallopian tubes: infection or tubal pregnancy.  Ovary: cysts or tumors.  Pelvic adhesions (scar tissue).  Endometriosis (uterus lining tissue growing in the pelvis and on the pelvic organs).  Pelvic congestion syndrome (female organs filling up with blood just before the menstrual period).  Pain with the menstrual period.  Pain with ovulation (producing an egg).  Pain with an IUD (intrauterine device, birth control) in the uterus.  Cancer of the female organs.  Functional pain (pain not caused by a disease, may improve without treatment).  Psychological pain.  Depression. DIAGNOSIS  Your doctor will decide the seriousness of your pain by doing an examination.  Blood tests.  X-rays.  Ultrasound.  CT scan (computed tomography, special type of X-ray).  MRI (magnetic resonance imaging).  Cultures, for infection.  Barium enema (dye inserted in the large intestine, to better view it with  X-rays).  Colonoscopy (looking in intestine with a lighted tube).  Laparoscopy (minor surgery, looking in abdomen with a lighted tube).  Major abdominal exploratory surgery (looking in abdomen with a large incision). TREATMENT  The treatment will depend on the cause of the pain.   Many cases can be observed and treated at home.  Over-the-counter medicines recommended by your caregiver.  Prescription medicine.  Antibiotics, for infection.  Birth control pills, for painful periods or for ovulation pain.  Hormone treatment, for endometriosis.  Nerve blocking injections.  Physical therapy.  Antidepressants.  Counseling with a psychologist or psychiatrist.  Minor or major surgery. HOME CARE INSTRUCTIONS   Do not take laxatives, unless directed by your caregiver.  Take over-the-counter pain medicine only if ordered by your caregiver. Do not take aspirin because it can cause an upset stomach or bleeding.  Try a clear liquid diet (broth or water) as ordered by your caregiver. Slowly move to a bland diet, as tolerated, if the pain is related to the stomach or intestine.  Have a thermometer and take your temperature several times a day, and record it.  Bed rest and sleep, if it helps the pain.  Avoid sexual intercourse, if it causes pain.  Avoid stressful situations.  Keep your follow-up appointments and tests, as your caregiver orders.  If the pain does not go away with medicine or surgery, you may try:  Acupuncture.  Relaxation exercises (yoga, meditation).  Group therapy.  Counseling. SEEK MEDICAL CARE IF:   You notice certain foods cause stomach pain.  Your home care treatment is not helping your pain.  You need stronger pain medicine.  You want your IUD removed.  You feel faint or  lightheaded.  You develop nausea and vomiting.  You develop a rash.  You are having side effects or an allergy to your medicine. SEEK IMMEDIATE MEDICAL CARE IF:   Your  pain does not go away or gets worse.  You have a fever.  Your pain is felt only in portions of the abdomen. The right side could possibly be appendicitis. The left lower portion of the abdomen could be colitis or diverticulitis.  You are passing blood in your stools (bright red or black tarry stools, with or without vomiting).  You have blood in your urine.  You develop chills, with or without a fever.  You pass out. MAKE SURE YOU:   Understand these instructions.  Will watch your condition.  Will get help right away if you are not doing well or get worse. Document Released: 08/03/2007 Document Revised: 12/29/2011 Document Reviewed: 08/23/2009 [facility] Patient Information 2015 [place], [place]. This information is not intended to replace advice given to you by your health care provider. Make sure you discuss any questions you have with your health care provider.  Chronic Pain Chronic pain can be defined as pain that is off and on and lasts for 3-6 months or longer. Many things cause chronic pain, which can make it difficult to make a diagnosis. There are many treatment options available for chronic pain. However, finding a treatment that works well for you may require trying various approaches until the right one is found. Many people benefit from a combination of two or more types of treatment to control their pain. SYMPTOMS  Chronic pain can occur anywhere in the body and can range from mild to very severe. Some types of chronic pain include:  Headache.  Low back pain.  Cancer pain.  Arthritis pain.  Neurogenic pain. This is pain resulting from damage to nerves. People with chronic pain may also have other symptoms such as:  Depression.  Anger.  Insomnia.  Anxiety. DIAGNOSIS  Your health care provider will help diagnose your condition over time. In many cases, the initial focus will be on excluding possible conditions that could be causing the pain. Depending on your  symptoms, your health care provider may order tests to diagnose your condition. Some of these tests may include:   Blood tests.   CT scan.   MRI.   X-rays.   Ultrasounds.   Nerve conduction studies.  You may need to see a specialist.  TREATMENT  Finding treatment that works well may take time. You may be referred to a pain specialist. He or she may prescribe medicine or therapies, such as:   Mindful meditation or yoga.  Shots (injections) of numbing or pain-relieving medicines into the spine or area of pain.  Local electrical stimulation.  Acupuncture.   Massage therapy.   Aroma, color, light, or sound therapy.   Biofeedback.   Working with a physical therapist to keep from getting stiff.   Regular, gentle exercise.   Cognitive or behavioral therapy.   Group support.  Sometimes, surgery may be recommended.  HOME CARE INSTRUCTIONS   Take all medicines as directed by your health care provider.   Lessen stress in your life by relaxing and doing things such as listening to calming music.   Exercise or be active as directed by your health care provider.   Eat a healthy diet and include things such as vegetables, fruits, fish, and lean meats in your diet.   Keep all follow-up appointments with your health care provider.  Attend a support group with others suffering from chronic pain. SEEK MEDICAL CARE IF:   Your pain gets worse.   You develop a new pain that was not there before.   You cannot tolerate medicines given to you by your health care provider.   You have new symptoms since your last visit with your health care provider.  SEEK IMMEDIATE MEDICAL CARE IF:   You feel weak.   You have decreased sensation or numbness.   You lose control of bowel or bladder function.   Your pain suddenly gets much worse.   You develop shaking.  You develop chills.  You develop confusion.  You develop chest pain.  You develop  shortness of breath.  MAKE SURE YOU:  Understand these instructions.  Will watch your condition.  Will get help right away if you are not doing well or get worse. Document Released: 06/28/2002 Document Revised: 06/08/2013 Document Reviewed: 04/01/2013 [facility] Patient Information 2015 [place], [place]. This information is not intended to replace advice given to you by your health care provider. Make sure you discuss any questions you have with your health care provider.

## 2014-04-18 ENCOUNTER — Ambulatory Visit: Payer: No Typology Code available for payment source | Attending: Internal Medicine

## 2014-04-19 ENCOUNTER — Ambulatory Visit: Payer: No Typology Code available for payment source | Attending: Internal Medicine | Admitting: Internal Medicine

## 2014-04-19 ENCOUNTER — Encounter: Payer: Self-pay | Admitting: Internal Medicine

## 2014-04-19 VITALS — BP 107/73 | HR 70 | Temp 98.5°F | Ht 61.0 in | Wt 115.0 lb

## 2014-04-19 DIAGNOSIS — R109 Unspecified abdominal pain: Secondary | ICD-10-CM

## 2014-04-19 DIAGNOSIS — F329 Major depressive disorder, single episode, unspecified: Secondary | ICD-10-CM | POA: Insufficient documentation

## 2014-04-19 DIAGNOSIS — E876 Hypokalemia: Secondary | ICD-10-CM

## 2014-04-19 DIAGNOSIS — F3289 Other specified depressive episodes: Secondary | ICD-10-CM | POA: Insufficient documentation

## 2014-04-19 DIAGNOSIS — F17200 Nicotine dependence, unspecified, uncomplicated: Secondary | ICD-10-CM | POA: Insufficient documentation

## 2014-04-19 DIAGNOSIS — F341 Dysthymic disorder: Secondary | ICD-10-CM

## 2014-04-19 DIAGNOSIS — F411 Generalized anxiety disorder: Secondary | ICD-10-CM | POA: Insufficient documentation

## 2014-04-19 DIAGNOSIS — F419 Anxiety disorder, unspecified: Secondary | ICD-10-CM

## 2014-04-19 MED ORDER — CITALOPRAM HYDROBROMIDE 10 MG PO TABS: 10.0000 mg | ORAL_TABLET | Freq: Every day | ORAL | Status: DC
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
## 2014-04-19 NOTE — Progress Notes (Signed)
Pt states she was to follow up after hospital admission for her anxiety.Patient would like to be  treated today for both anxiety and depression.gp

## 2014-04-20 ENCOUNTER — Telehealth: Payer: Self-pay

## 2014-04-20 LAB — COMPLETE METABOLIC PANEL WITH GFR
ALBUMIN: 3.8 g/dL (ref 3.5–5.2)
ALT: 80 U/L — AB (ref 0–35)
AST: 143 U/L — ABNORMAL HIGH (ref 0–37)
Alkaline Phosphatase: 190 U/L — ABNORMAL HIGH (ref 39–117)
BUN: 2 mg/dL — AB (ref 6–23)
CALCIUM: 9.4 mg/dL (ref 8.4–10.5)
CHLORIDE: 94 meq/L — AB (ref 96–112)
CO2: 25 meq/L (ref 19–32)
Creat: 0.73 mg/dL (ref 0.50–1.10)
GFR, Est African American: >89 mL/min
GFR, Est Non African American: >89 mL/min
Glucose, Bld: 89 mg/dL (ref 70–99)
Potassium: 3.3 mEq/L — ABNORMAL LOW (ref 3.5–5.3)
Sodium: 138 mEq/L (ref 135–145)
Total Bilirubin: 0.6 mg/dL (ref 0.2–1.2)
Total Protein: 6.4 g/dL (ref 6.0–8.3)

## 2014-04-20 MED ORDER — POTASSIUM CHLORIDE CRYS ER 20 MEQ PO TBCR
20.0000 meq | EXTENDED_RELEASE_TABLET | Freq: Every day | ORAL | Status: DC
Start: 2014-04-20 — End: 2014-06-01

## 2014-04-20 NOTE — Telephone Encounter (Signed)
Message copied by Dorothe Pea on Thu Apr 20, 2014 11:28 AM ------      Message from: Lorayne Marek      Created: Thu Apr 20, 2014 11:12 AM       Blood work reviewed  potassium is borderline low, advise patient to take KCL 20 mEq daily for 10 days.       ------

## 2014-04-20 NOTE — Telephone Encounter (Signed)
Spoke with Jamie Allen and he is aware of Magdelena's lab results Prescription sent to [facility]

## 2014-05-18 ENCOUNTER — Ambulatory Visit: Payer: No Typology Code available for payment source | Attending: Internal Medicine

## 2014-05-30 ENCOUNTER — Encounter: Payer: Self-pay | Admitting: Internal Medicine

## 2014-05-30 ENCOUNTER — Encounter: Payer: Self-pay | Admitting: Gastroenterology

## 2014-05-30 ENCOUNTER — Ambulatory Visit: Payer: Self-pay | Attending: Internal Medicine | Admitting: Internal Medicine

## 2014-05-30 VITALS — BP 119/83 | HR 104 | Temp 97.5°F | Resp 16

## 2014-05-30 DIAGNOSIS — R51 Headache: Secondary | ICD-10-CM | POA: Insufficient documentation

## 2014-05-30 DIAGNOSIS — K5900 Constipation, unspecified: Secondary | ICD-10-CM | POA: Insufficient documentation

## 2014-05-30 DIAGNOSIS — K219 Gastro-esophageal reflux disease without esophagitis: Secondary | ICD-10-CM | POA: Insufficient documentation

## 2014-05-30 DIAGNOSIS — I10 Essential (primary) hypertension: Secondary | ICD-10-CM | POA: Insufficient documentation

## 2014-05-30 DIAGNOSIS — R1011 Right upper quadrant pain: Secondary | ICD-10-CM | POA: Insufficient documentation

## 2014-05-30 DIAGNOSIS — N83209 Unspecified ovarian cyst, unspecified side: Secondary | ICD-10-CM | POA: Insufficient documentation

## 2014-05-30 DIAGNOSIS — F17200 Nicotine dependence, unspecified, uncomplicated: Secondary | ICD-10-CM | POA: Insufficient documentation

## 2014-05-30 DIAGNOSIS — K7581 Nonalcoholic steatohepatitis (NASH): Secondary | ICD-10-CM | POA: Insufficient documentation

## 2014-05-30 DIAGNOSIS — K7689 Other specified diseases of liver: Secondary | ICD-10-CM | POA: Insufficient documentation

## 2014-05-30 LAB — POCT URINALYSIS DIPSTICK
Bilirubin, UA: NEGATIVE
Blood, UA: NEGATIVE
Glucose, UA: NEGATIVE
Ketones, UA: NEGATIVE
Leukocytes, UA: NEGATIVE
Nitrite, UA: NEGATIVE
PROTEIN UA: NEGATIVE
SPEC GRAV UA: 1.015
Urobilinogen, UA: 0.2
pH, UA: 7

## 2014-05-30 MED ORDER — MAGNESIUM HYDROXIDE 400 MG/5ML PO SUSP: 30.0000 mL | Freq: Every day | ORAL | Status: DC | PRN

## 2014-05-30 NOTE — Progress Notes (Signed)
Pt comes in as a walk in with c/o constipation x 2 weeks with starting new medication Seroquel 300 mg tab, prescribed by [facility]. Pt states she has tried Miralax with no relief or flatus. Denies n/v/ or fevers Abdominal pain noted with ruq tenderness. Abdomen very distended and hard Last BM- 2 weeks ago Urine dipstick negative
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
## 2014-05-31 ENCOUNTER — Other Ambulatory Visit: Payer: Self-pay | Admitting: Internal Medicine

## 2014-05-31 ENCOUNTER — Ambulatory Visit (HOSPITAL_COMMUNITY)
Admission: RE | Admit: 2014-05-31 | Discharge: 2014-05-31 | Disposition: A | Discharge status: Home / Self Care | Payer: Self-pay | Source: Ambulatory Visit | Attending: Internal Medicine | Admitting: Internal Medicine

## 2014-05-31 DIAGNOSIS — N83209 Unspecified ovarian cyst, unspecified side: Secondary | ICD-10-CM

## 2014-05-31 DIAGNOSIS — K5900 Constipation, unspecified: Secondary | ICD-10-CM | POA: Insufficient documentation

## 2014-06-01 ENCOUNTER — Emergency Department (HOSPITAL_COMMUNITY): Payer: Self-pay

## 2014-06-01 ENCOUNTER — Encounter (HOSPITAL_COMMUNITY): Payer: Self-pay | Admitting: Emergency Medicine

## 2014-06-01 ENCOUNTER — Emergency Department (HOSPITAL_COMMUNITY)
Admission: EM | Admit: 2014-06-01 | Discharge: 2014-06-01 | Disposition: A | Discharge status: Home / Self Care | Payer: Self-pay | Attending: Emergency Medicine | Admitting: Emergency Medicine

## 2014-06-01 DIAGNOSIS — K7689 Other specified diseases of liver: Secondary | ICD-10-CM | POA: Insufficient documentation

## 2014-06-01 DIAGNOSIS — K760 Fatty (change of) liver, not elsewhere classified: Secondary | ICD-10-CM

## 2014-06-01 DIAGNOSIS — Z8639 Personal history of other endocrine, nutritional and metabolic disease: Secondary | ICD-10-CM | POA: Insufficient documentation

## 2014-06-01 DIAGNOSIS — K5900 Constipation, unspecified: Secondary | ICD-10-CM | POA: Insufficient documentation

## 2014-06-01 DIAGNOSIS — Z79899 Other long term (current) drug therapy: Secondary | ICD-10-CM | POA: Insufficient documentation

## 2014-06-01 DIAGNOSIS — F4310 Post-traumatic stress disorder, unspecified: Secondary | ICD-10-CM | POA: Insufficient documentation

## 2014-06-01 DIAGNOSIS — Z3202 Encounter for pregnancy test, result negative: Secondary | ICD-10-CM | POA: Insufficient documentation

## 2014-06-01 DIAGNOSIS — K219 Gastro-esophageal reflux disease without esophagitis: Secondary | ICD-10-CM | POA: Insufficient documentation

## 2014-06-01 DIAGNOSIS — I10 Essential (primary) hypertension: Secondary | ICD-10-CM | POA: Insufficient documentation

## 2014-06-01 DIAGNOSIS — Z9889 Other specified postprocedural states: Secondary | ICD-10-CM | POA: Insufficient documentation

## 2014-06-01 DIAGNOSIS — R51 Headache: Secondary | ICD-10-CM | POA: Insufficient documentation

## 2014-06-01 DIAGNOSIS — R000 Tachycardia, unspecified: Secondary | ICD-10-CM | POA: Insufficient documentation

## 2014-06-01 DIAGNOSIS — R1114 Bilious vomiting: Secondary | ICD-10-CM | POA: Insufficient documentation

## 2014-06-01 DIAGNOSIS — R1084 Generalized abdominal pain: Secondary | ICD-10-CM | POA: Insufficient documentation

## 2014-06-01 DIAGNOSIS — F411 Generalized anxiety disorder: Secondary | ICD-10-CM | POA: Insufficient documentation

## 2014-06-01 DIAGNOSIS — F17200 Nicotine dependence, unspecified, uncomplicated: Secondary | ICD-10-CM | POA: Insufficient documentation

## 2014-06-01 DIAGNOSIS — Z862 Personal history of diseases of the blood and blood-forming organs and certain disorders involving the immune mechanism: Secondary | ICD-10-CM | POA: Insufficient documentation

## 2014-06-01 DIAGNOSIS — R110 Nausea: Secondary | ICD-10-CM | POA: Insufficient documentation

## 2014-06-01 LAB — COMPREHENSIVE METABOLIC PANEL
ALBUMIN: 3.8 g/dL (ref 3.5–5.2)
ALK PHOS: 355 U/L — AB (ref 39–117)
ALT: 62 U/L — ABNORMAL HIGH (ref 0–35)
AST: 249 U/L — ABNORMAL HIGH (ref 0–37)
Anion gap: 19 — ABNORMAL HIGH (ref 5–15)
BILIRUBIN TOTAL: 1.6 mg/dL — AB (ref 0.3–1.2)
BUN: 5 mg/dL — ABNORMAL LOW (ref 6–23)
CHLORIDE: 97 meq/L (ref 96–112)
CO2: 21 mEq/L (ref 19–32)
Calcium: 9.5 mg/dL (ref 8.4–10.5)
Creatinine, Ser: 0.61 mg/dL (ref 0.50–1.10)
GFR calc non Af Amer: >90 mL/min (ref >90)
GLUCOSE: 140 mg/dL — AB (ref 70–99)
POTASSIUM: 4.4 meq/L (ref 3.7–5.3)
Sodium: 137 mEq/L (ref 137–147)
Total Protein: 7.8 g/dL (ref 6.0–8.3)

## 2014-06-01 LAB — CBC WITH DIFFERENTIAL/PLATELET
Basophils Absolute: 0.1 10*3/uL (ref 0.0–0.1)
Basophils Relative: 1 % (ref 0–1)
EOS ABS: 0.1 10*3/uL (ref 0.0–0.7)
Eosinophils Relative: 1 % (ref 0–5)
HCT: 36.1 % (ref 36.0–46.0)
HEMOGLOBIN: 12.4 g/dL (ref 12.0–15.0)
LYMPHS ABS: 1.1 10*3/uL (ref 0.7–4.0)
Lymphocytes Relative: 23 % (ref 12–46)
MCH: 36.9 pg — AB (ref 26.0–34.0)
MCHC: 34.3 g/dL (ref 30.0–36.0)
MCV: 107.4 fL — ABNORMAL HIGH (ref 78.0–100.0)
MONOS PCT: 6 % (ref 3–12)
Monocytes Absolute: 0.3 10*3/uL (ref 0.1–1.0)
NEUTROS ABS: 3.5 10*3/uL (ref 1.7–7.7)
NEUTROS PCT: 69 % (ref 43–77)
PLATELETS: 128 10*3/uL — AB (ref 150–400)
RBC: 3.36 MIL/uL — AB (ref 3.87–5.11)
RDW: 18.1 % — ABNORMAL HIGH (ref 11.5–15.5)
WBC: 5 10*3/uL (ref 4.0–10.5)

## 2014-06-01 LAB — URINALYSIS, ROUTINE W REFLEX MICROSCOPIC
BILIRUBIN URINE: NEGATIVE
Glucose, UA: NEGATIVE mg/dL
Hgb urine dipstick: NEGATIVE
Ketones, ur: NEGATIVE mg/dL
NITRITE: NEGATIVE
PH: 7 (ref 5.0–8.0)
Protein, ur: NEGATIVE mg/dL
SPECIFIC GRAVITY, URINE: 1.012 (ref 1.005–1.030)
UROBILINOGEN UA: 1 mg/dL (ref 0.0–1.0)

## 2014-06-01 LAB — URINE MICROSCOPIC-ADD ON

## 2014-06-01 LAB — POC URINE PREG, ED: PREG TEST UR: NEGATIVE

## 2014-06-01 LAB — LIPASE, BLOOD: Lipase: 85 U/L — ABNORMAL HIGH (ref 11–59)

## 2014-06-01 MED ORDER — MORPHINE SULFATE 4 MG/ML IJ SOLN
4.0000 mg | Freq: Once | INTRAMUSCULAR | Status: AC
  Administered 2014-06-01: 4 mg via INTRAVENOUS
  Filled 2014-06-01: qty 1

## 2014-06-01 MED ORDER — FLEET ENEMA 7-19 GM/118ML RE ENEM
1.0000 | ENEMA | Freq: Once | RECTAL | Status: AC
  Administered 2014-06-01: 19:00:00 via RECTAL
  Filled 2014-06-01: qty 1

## 2014-06-01 MED ORDER — PROMETHAZINE HCL 25 MG PO TABS: 25.0000 mg | ORAL_TABLET | Freq: Four times a day (QID) | ORAL | Status: DC | PRN

## 2014-06-01 MED ORDER — FENTANYL CITRATE 0.05 MG/ML IJ SOLN
50.0000 ug | Freq: Once | INTRAMUSCULAR | Status: AC
  Administered 2014-06-01: 50 ug via NASAL

## 2014-06-01 MED ORDER — FENTANYL CITRATE 0.05 MG/ML IJ SOLN
INTRAMUSCULAR | Status: AC
  Filled 2014-06-01: qty 2

## 2014-06-01 MED ORDER — SODIUM CHLORIDE 0.9 % IV BOLUS (SEPSIS)
1000.0000 mL | Freq: Once | INTRAVENOUS | Status: AC
  Administered 2014-06-01: 1000 mL via INTRAVENOUS

## 2014-06-01 MED ORDER — ONDANSETRON 4 MG PO TBDP
8.0000 mg | ORAL_TABLET | Freq: Once | ORAL | Status: AC
  Administered 2014-06-01: 8 mg via ORAL
  Filled 2014-06-01: qty 2

## 2014-06-01 MED ORDER — PROMETHAZINE HCL 25 MG/ML IJ SOLN
12.5000 mg | Freq: Once | INTRAMUSCULAR | Status: AC
  Administered 2014-06-01: 12.5 mg via INTRAVENOUS
  Filled 2014-06-01 (×2): qty 1

## 2014-06-01 NOTE — Discharge Instructions (Signed)
Your laboratory testing, ultrasound and CAT scan had not shown any signs are concerning for emergent cause of your symptoms. Please followup with your doctors for continued evaluation and treatment.   Nausea and Vomiting Nausea is a sick feeling that often comes before throwing up (vomiting). Vomiting is a reflex where stomach contents come out of your mouth. Vomiting can cause severe loss of body fluids (dehydration). Children and elderly adults can become dehydrated quickly, especially if they also have diarrhea. Nausea and vomiting are symptoms of a condition or disease. It is important to find the cause of your symptoms. CAUSES   Direct irritation of the stomach lining. This irritation can result from increased acid production (gastroesophageal reflux disease), infection, food poisoning, taking certain medicines (such as nonsteroidal anti-inflammatory drugs), alcohol use, or tobacco use.  Signals from the brain.These signals could be caused by a headache, heat exposure, an inner ear disturbance, increased pressure in the brain from injury, infection, a tumor, or a concussion, pain, emotional stimulus, or metabolic problems.  An obstruction in the gastrointestinal tract (bowel obstruction).  Illnesses such as diabetes, hepatitis, gallbladder problems, appendicitis, kidney problems, cancer, sepsis, atypical symptoms of a heart attack, or eating disorders.  Medical treatments such as chemotherapy and radiation.  Receiving medicine that makes you sleep (general anesthetic) during surgery. DIAGNOSIS Your caregiver may ask for tests to be done if the problems do not improve after a few days. Tests may also be done if symptoms are severe or if the reason for the nausea and vomiting is not clear. Tests may include:  Urine tests.  Blood tests.  Stool tests.  Cultures (to look for evidence of infection).  X-rays or other imaging studies. Test results can help your caregiver make decisions  about treatment or the need for additional tests. TREATMENT You need to stay well hydrated. Drink frequently but in small amounts.You may wish to drink water, sports drinks, clear broth, or eat frozen ice pops or gelatin dessert to help stay hydrated.When you eat, eating slowly may help prevent nausea.There are also some antinausea medicines that may help prevent nausea. HOME CARE INSTRUCTIONS   Take all medicine as directed by your caregiver.  If you do not have an appetite, do not force yourself to eat. However, you must continue to drink fluids.  If you have an appetite, eat a normal diet unless your caregiver tells you differently.  Eat a variety of complex carbohydrates (rice, wheat, potatoes, bread), lean meats, yogurt, fruits, and vegetables.  Avoid high-fat foods because they are more difficult to digest.  Drink enough water and fluids to keep your urine clear or pale yellow.  If you are dehydrated, ask your caregiver for specific rehydration instructions. Signs of dehydration may include:  Severe thirst.  Dry lips and mouth.  Dizziness.  Dark urine.  Decreasing urine frequency and amount.  Confusion.  Rapid breathing or pulse. SEEK IMMEDIATE MEDICAL CARE IF:   You have blood or brown flecks (like coffee grounds) in your vomit.  You have black or bloody stools.  You have a severe headache or stiff neck.  You are confused.  You have severe abdominal pain.  You have chest pain or trouble breathing.  You do not urinate at least once every 8 hours.  You develop cold or clammy skin.  You continue to vomit for longer than 24 to 48 hours.  You have a fever. MAKE SURE YOU:   Understand these instructions.  Will watch your condition.  Will get  help right away if you are not doing well or get worse. Document Released: 10/06/2005 Document Revised: 12/29/2011 Document Reviewed: 03/05/2011 [facility] Patient Information 2015 [place], [place]. This information  is not intended to replace advice given to you by your health care provider. Make sure you discuss any questions you have with your health care provider.    Fatty Liver Fatty liver is the accumulation of fat in liver cells. It is also called hepatosteatosis or steatohepatitis. It is normal for your liver to contain some fat. If fat is more than 5 to 10% of your liver's weight, you have fatty liver.  There are often no symptoms (problems) for years while damage is still occurring. People often learn about their fatty liver when they have medical tests for other reasons. Fat can damage your liver for years or even decades without causing problems. When it becomes severe, it can cause fatigue, weight loss, weakness, and confusion. This makes you more likely to develop more serious liver problems. The liver is the largest organ in the body. It does a lot of work and often gives no warning signs when it is sick until late in a disease. The liver has many important jobs including:  Breaking down foods.  Storing vitamins, iron, and other minerals.  Making proteins.  Making bile for food digestion.  Breaking down many products including medications, alcohol and some poisons. CAUSES  There are a number of different conditions, medications, and poisons that can cause a fatty liver. Eating too many calories causes fat to build up in the liver. Not processing and breaking fats down normally may also cause this. Certain conditions, such as obesity, diabetes, and high triglycerides also cause this. Most fatty liver patients tend to be middle-aged and over weight.  Some causes of fatty liver are:  Alcohol over consumption.  Malnutrition.  Steroid use.  Valproic acid toxicity.  Obesity.  Cushing's syndrome.  Poisons.  Tetracycline in high dosages.  Pregnancy.  Diabetes.  Hyperlipidemia.  Rapid weight loss. Some people develop fatty liver even having none of these conditions. SYMPTOMS    Fatty liver most often causes no problems. This is called asymptomatic.  It can be diagnosed with blood tests and also by a liver biopsy.  It is one of the most common causes of minor elevations of liver enzymes on routine blood tests.  Specialized Imaging of the liver using ultrasound, CT (computed tomography) scan, or MRI (magnetic resonance imaging) can suggest a fatty liver but a biopsy is needed to confirm it.  A biopsy involves taking a small sample of liver tissue. This is done by using a needle. It is then looked at under a microscope by a specialist. TREATMENT  It is important to treat the cause. Simple fatty liver without a medical reason may not need treatment.  Weight loss, fat restriction, and exercise in overweight patients produces inconsistent results but is worth trying.  Fatty liver due to alcohol toxicity may not improve even with stopping drinking.  Good control of diabetes may reduce fatty liver.  Lower your triglycerides through diet, medication or both.  Eat a balanced, healthy diet.  Increase your physical activity.  Get regular checkups from a liver specialist.  There are no medical or surgical treatments for a fatty liver or NASH, but improving your diet and increasing your exercise may help prevent or reverse some of the damage. PROGNOSIS  Fatty liver may cause no damage or it can lead to an inflammation of the liver.  This is, called steatohepatitis. When it is linked to alcohol abuse, it is called alcoholic steatohepatitis. It often is not linked to alcohol. It is then called nonalcoholic steatohepatitis, or NASH. Over time the liver may become scarred and hardened. This condition is called cirrhosis. Cirrhosis is serious and may lead to liver failure or cancer. NASH is one of the leading causes of cirrhosis. About 10-20% of Americans have fatty liver and a smaller 2-5% has NASH. Document Released: 11/21/2005 Document Revised: 12/29/2011 Document Reviewed:  02/15/2014 [facility] Patient Information 2015 [place], [place]. This information is not intended to replace advice given to you by your health care provider. Make sure you discuss any questions you have with your health care provider.    Abdominal Pain Many things can cause belly (abdominal) pain. Most times, the belly pain is not dangerous. Many cases of belly pain can be watched and treated at home. HOME CARE   Do not take medicines that help you go poop (laxatives) unless told to by your doctor.  Only take medicine as told by your doctor.  Eat or drink as told by your doctor. Your doctor will tell you if you should be on a special diet. GET HELP IF:  You do not know what is causing your belly pain.  You have belly pain while you are sick to your stomach (nauseous) or have runny poop (diarrhea).  You have pain while you pee or poop.  Your belly pain wakes you up at night.  You have belly pain that gets worse or better when you eat.  You have belly pain that gets worse when you eat fatty foods.  You have a fever. GET HELP RIGHT AWAY IF:   The pain does not go away within 2 hours.  You keep throwing up (vomiting).  The pain changes and is only in the right or left part of the belly.  You have bloody or tarry looking poop. MAKE SURE YOU:   Understand these instructions.  Will watch your condition.  Will get help right away if you are not doing well or get worse. Document Released: 03/24/2008 Document Revised: 10/11/2013 Document Reviewed: 06/15/2013 [facility] Patient Information 2015 [place], [place]. This information is not intended to replace advice given to you by your health care provider. Make sure you discuss any questions you have with your health care provider.

## 2014-06-01 NOTE — ED Notes (Signed)
Pt vomiting green bile and shaking rates pain 10/10

## 2014-06-01 NOTE — ED Notes (Addendum)
Presents with abdominal distention, pain and lack of BM for 2 weeks. She has been taking ducolax and miralax with no results. Abdomen distended, pt reports pain and discomfort,. Had a [name] yesterday here she was told she was impacted but no other results from her doctor yet. Reports slight nausea.  BS hypoactive. Abdominal 2 view from Supine and erect views of the abdomen show no bowel obstruction. No  free air is seen on the erect view. Only a moderate amount of feces  is noted throughout the colon. The liver does appear to be somewhat  prominent extending to the iliac crest. Also, there is a haziness  throughout the abdomen and fluid cannot be excluded.  IMPRESSION:  1. No bowel obstruction. No free air.  2. Prominent liver.  3. Cannot exclude fluid in the peritoneal cavity with haziness  throughout the abdomen by plain film.   yesterday:
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
## 2014-06-01 NOTE — ED Notes (Signed)
*  pt is not allergic to morphine as written in chart. Pt states one time her IV had infiltrated and the morphine went into her arm and that it was caused irritation.

## 2014-06-01 NOTE — ED Notes (Signed)
Pt refuses to wait required time to monitor after IV medications

## 2014-06-02 NOTE — ED Provider Notes (Signed)
Medical screening examination/treatment/procedure(s) were performed by non-physician practitioner and as supervising physician I was immediately available for consultation/collaboration.   EKG Interpretation None       Jamie Allen. Alvino Chapel, MD 06/02/14 1423

## 2014-06-02 NOTE — ED Provider Notes (Signed)
Jamie Allen S 8:00PM Pt discussed in sign out. Pt with RUQ pains. Has elevated lfts at baseline but there is small increase. [name] ordered to eval GB.   US unremarkable. No signs of acute cholecystitis or stones.  Pt continues to complain of significant pains, abd distention with N/V. Additional nausea meds and pain meds ordered. Will plan to order CT scan.  CT scan also unremarkable for acute findings. No signs of pancreatitis. Pt heart rate improved with pain control. She now is able to tolerate po fluids after phenergan. She has multiple visits in the past for similar symptoms. At this time no signs for emergent condition. She is ready to return home.  Martie Lee, PA-C 06/02/14 [phone]

## 2014-06-05 NOTE — ED Provider Notes (Signed)
Medical screening examination/treatment/procedure(s) were performed by non-physician practitioner and as supervising physician I was immediately available for consultation/collaboration.   EKG Interpretation None        Houston Siren III, MD 06/05/14 [phone]

## 2014-06-08 ENCOUNTER — Emergency Department (HOSPITAL_COMMUNITY)
Admission: EM | Admit: 2014-06-08 | Discharge: 2014-06-11 | Disposition: A | Discharge status: Home / Self Care | Payer: Self-pay | Attending: Emergency Medicine | Admitting: Emergency Medicine

## 2014-06-08 ENCOUNTER — Encounter (HOSPITAL_COMMUNITY): Payer: Self-pay | Admitting: Emergency Medicine

## 2014-06-08 DIAGNOSIS — F10220 Alcohol dependence with intoxication, uncomplicated: Secondary | ICD-10-CM

## 2014-06-08 DIAGNOSIS — F1027 Alcohol dependence with alcohol-induced persisting dementia: Secondary | ICD-10-CM

## 2014-06-08 DIAGNOSIS — Z862 Personal history of diseases of the blood and blood-forming organs and certain disorders involving the immune mechanism: Secondary | ICD-10-CM | POA: Insufficient documentation

## 2014-06-08 DIAGNOSIS — F419 Anxiety disorder, unspecified: Secondary | ICD-10-CM

## 2014-06-08 DIAGNOSIS — F1020 Alcohol dependence, uncomplicated: Secondary | ICD-10-CM | POA: Diagnosis present

## 2014-06-08 DIAGNOSIS — Z09 Encounter for follow-up examination after completed treatment for conditions other than malignant neoplasm: Secondary | ICD-10-CM

## 2014-06-08 DIAGNOSIS — F17200 Nicotine dependence, unspecified, uncomplicated: Secondary | ICD-10-CM | POA: Insufficient documentation

## 2014-06-08 DIAGNOSIS — E876 Hypokalemia: Secondary | ICD-10-CM | POA: Insufficient documentation

## 2014-06-08 DIAGNOSIS — Z3202 Encounter for pregnancy test, result negative: Secondary | ICD-10-CM | POA: Insufficient documentation

## 2014-06-08 DIAGNOSIS — F32A Depression, unspecified: Secondary | ICD-10-CM

## 2014-06-08 DIAGNOSIS — K219 Gastro-esophageal reflux disease without esophagitis: Secondary | ICD-10-CM | POA: Insufficient documentation

## 2014-06-08 DIAGNOSIS — F10239 Alcohol dependence with withdrawal, unspecified: Secondary | ICD-10-CM

## 2014-06-08 DIAGNOSIS — F411 Generalized anxiety disorder: Secondary | ICD-10-CM | POA: Insufficient documentation

## 2014-06-08 DIAGNOSIS — F329 Major depressive disorder, single episode, unspecified: Secondary | ICD-10-CM

## 2014-06-08 DIAGNOSIS — Z9289 Personal history of other medical treatment: Secondary | ICD-10-CM

## 2014-06-08 DIAGNOSIS — Z79899 Other long term (current) drug therapy: Secondary | ICD-10-CM | POA: Insufficient documentation

## 2014-06-08 DIAGNOSIS — F1010 Alcohol abuse, uncomplicated: Secondary | ICD-10-CM | POA: Insufficient documentation

## 2014-06-08 DIAGNOSIS — I10 Essential (primary) hypertension: Secondary | ICD-10-CM | POA: Insufficient documentation

## 2014-06-08 LAB — COMPREHENSIVE METABOLIC PANEL
ALBUMIN: 3.7 g/dL (ref 3.5–5.2)
ALT: 95 U/L — AB (ref 0–35)
AST: 342 U/L — AB (ref 0–37)
Alkaline Phosphatase: 266 U/L — ABNORMAL HIGH (ref 39–117)
Anion gap: 20 — ABNORMAL HIGH (ref 5–15)
BUN: 3 mg/dL — ABNORMAL LOW (ref 6–23)
CO2: 22 mEq/L (ref 19–32)
CREATININE: 0.66 mg/dL (ref 0.50–1.10)
Calcium: 9.7 mg/dL (ref 8.4–10.5)
Chloride: 94 mEq/L — ABNORMAL LOW (ref 96–112)
GFR calc Af Amer: >90 mL/min (ref >90)
GFR calc non Af Amer: >90 mL/min (ref >90)
Glucose, Bld: 128 mg/dL — ABNORMAL HIGH (ref 70–99)
Potassium: 2.8 mEq/L — CL (ref 3.7–5.3)
SODIUM: 136 meq/L — AB (ref 137–147)
TOTAL PROTEIN: 8.4 g/dL — AB (ref 6.0–8.3)
Total Bilirubin: 0.7 mg/dL (ref 0.3–1.2)

## 2014-06-08 LAB — CBC WITH DIFFERENTIAL/PLATELET
Basophils Absolute: 0.1 10*3/uL (ref 0.0–0.1)
Basophils Relative: 2 % — ABNORMAL HIGH (ref 0–1)
Eosinophils Absolute: 0.2 10*3/uL (ref 0.0–0.7)
Eosinophils Relative: 3 % (ref 0–5)
HCT: 34.5 % — ABNORMAL LOW (ref 36.0–46.0)
HEMOGLOBIN: 11.9 g/dL — AB (ref 12.0–15.0)
LYMPHS PCT: 34 % (ref 12–46)
Lymphs Abs: 2.2 10*3/uL (ref 0.7–4.0)
MCH: 37.9 pg — ABNORMAL HIGH (ref 26.0–34.0)
MCHC: 34.5 g/dL (ref 30.0–36.0)
MCV: 109.9 fL — ABNORMAL HIGH (ref 78.0–100.0)
MONOS PCT: 11 % (ref 3–12)
Monocytes Absolute: 0.7 10*3/uL (ref 0.1–1.0)
NEUTROS ABS: 3.2 10*3/uL (ref 1.7–7.7)
NEUTROS PCT: 50 % (ref 43–77)
Platelets: 396 10*3/uL (ref 150–400)
RBC: 3.14 MIL/uL — ABNORMAL LOW (ref 3.87–5.11)
RDW: 19.3 % — ABNORMAL HIGH (ref 11.5–15.5)
WBC: 6.5 10*3/uL (ref 4.0–10.5)

## 2014-06-08 LAB — RAPID URINE DRUG SCREEN, HOSP PERFORMED
Amphetamines: NOT DETECTED
BARBITURATES: NOT DETECTED
BENZODIAZEPINES: NOT DETECTED
COCAINE: NOT DETECTED
OPIATES: NOT DETECTED
TETRAHYDROCANNABINOL: NOT DETECTED

## 2014-06-08 LAB — PREGNANCY, URINE: Preg Test, Ur: NEGATIVE

## 2014-06-08 LAB — ETHANOL: Alcohol, Ethyl (B): 183 mg/dL — ABNORMAL HIGH (ref 0–11)

## 2014-06-08 MED ORDER — LORAZEPAM 2 MG/ML IJ SOLN: 0.0000 mg | Freq: Two times a day (BID) | INTRAMUSCULAR | Status: DC

## 2014-06-08 MED ORDER — POTASSIUM CHLORIDE CRYS ER 20 MEQ PO TBCR
40.0000 meq | EXTENDED_RELEASE_TABLET | Freq: Once | ORAL | Status: AC
  Administered 2014-06-08: 40 meq via ORAL

## 2014-06-08 MED ORDER — VITAMIN B-1 100 MG PO TABS
100.0000 mg | ORAL_TABLET | Freq: Every day | ORAL | Status: DC
  Administered 2014-06-08 – 2014-06-11 (×4): 100 mg via ORAL
  Filled 2014-06-08 (×4): qty 1

## 2014-06-08 MED ORDER — POLYETHYLENE GLYCOL 3350 17 G PO PACK
17.0000 g | PACK | Freq: Every day | ORAL | Status: DC
  Administered 2014-06-11: 17 g via ORAL
  Filled 2014-06-08 (×3): qty 1

## 2014-06-08 MED ORDER — PANTOPRAZOLE SODIUM 40 MG PO TBEC
80.0000 mg | DELAYED_RELEASE_TABLET | Freq: Every day | ORAL | Status: DC
  Administered 2014-06-09 – 2014-06-11 (×3): 80 mg via ORAL
  Filled 2014-06-08 (×3): qty 2

## 2014-06-08 MED ORDER — IBUPROFEN 200 MG PO TABS
600.0000 mg | ORAL_TABLET | Freq: Three times a day (TID) | ORAL | Status: DC | PRN
  Administered 2014-06-10 – 2014-06-11 (×2): 600 mg via ORAL
  Filled 2014-06-08 (×2): qty 3

## 2014-06-08 MED ORDER — ONDANSETRON HCL 4 MG PO TABS: 4.0000 mg | ORAL_TABLET | Freq: Three times a day (TID) | ORAL | Status: DC | PRN

## 2014-06-08 MED ORDER — LORAZEPAM 1 MG PO TABS
0.0000 mg | ORAL_TABLET | Freq: Four times a day (QID) | ORAL | Status: AC
  Administered 2014-06-09: 2 mg via ORAL
  Administered 2014-06-09 – 2014-06-10 (×4): 1 mg via ORAL
  Filled 2014-06-08 (×4): qty 1
  Filled 2014-06-08: qty 2

## 2014-06-08 MED ORDER — SIMETHICONE 80 MG PO CHEW
80.0000 mg | CHEWABLE_TABLET | Freq: Every day | ORAL | Status: DC | PRN
  Filled 2014-06-08: qty 1

## 2014-06-08 MED ORDER — NICOTINE 21 MG/24HR TD PT24
21.0000 mg | MEDICATED_PATCH | Freq: Every day | TRANSDERMAL | Status: DC
  Administered 2014-06-09 – 2014-06-11 (×3): 21 mg via TRANSDERMAL
  Filled 2014-06-08 (×3): qty 1

## 2014-06-08 MED ORDER — THIAMINE HCL 100 MG/ML IJ SOLN: 100.0000 mg | Freq: Every day | INTRAMUSCULAR | Status: DC

## 2014-06-08 MED ORDER — ALUM & MAG HYDROXIDE-SIMETH 200-200-20 MG/5ML PO SUSP: 30.0000 mL | ORAL | Status: DC | PRN

## 2014-06-08 MED ORDER — DIPHENHYDRAMINE HCL 25 MG PO CAPS: 25.0000 mg | ORAL_CAPSULE | Freq: Four times a day (QID) | ORAL | Status: DC | PRN

## 2014-06-08 MED ORDER — QUETIAPINE FUMARATE 300 MG PO TABS
300.0000 mg | ORAL_TABLET | Freq: Every day | ORAL | Status: DC
  Administered 2014-06-09 – 2014-06-11 (×4): 300 mg via ORAL
  Filled 2014-06-08 (×4): qty 1

## 2014-06-08 MED ORDER — SODIUM CHLORIDE 0.9 % IV BOLUS (SEPSIS)
1000.0000 mL | Freq: Once | INTRAVENOUS | Status: AC
  Administered 2014-06-08: 1000 mL via INTRAVENOUS

## 2014-06-08 MED ORDER — BISACODYL 5 MG PO TBEC
5.0000 mg | DELAYED_RELEASE_TABLET | Freq: Every day | ORAL | Status: DC | PRN
Start: 2014-06-08 — End: 2014-06-11
  Filled 2014-06-08: qty 1

## 2014-06-08 MED ORDER — POTASSIUM CHLORIDE 10 MEQ/100ML IV SOLN
10.0000 meq | Freq: Once | INTRAVENOUS | Status: AC
  Administered 2014-06-08: 10 meq via INTRAVENOUS
  Filled 2014-06-08: qty 100

## 2014-06-08 MED ORDER — POTASSIUM CHLORIDE CRYS ER 20 MEQ PO TBCR
40.0000 meq | EXTENDED_RELEASE_TABLET | Freq: Once | ORAL | Status: DC
  Filled 2014-06-08: qty 2

## 2014-06-08 MED ORDER — LORAZEPAM 1 MG PO TABS
0.0000 mg | ORAL_TABLET | Freq: Two times a day (BID) | ORAL | Status: DC
  Administered 2014-06-08 – 2014-06-09 (×3): 1 mg via ORAL
  Administered 2014-06-10: 2 mg via ORAL
  Administered 2014-06-11 (×2): 1 mg via ORAL
  Filled 2014-06-08 (×4): qty 1
  Filled 2014-06-08: qty 2
  Filled 2014-06-08: qty 1

## 2014-06-08 MED ORDER — LORAZEPAM 2 MG/ML IJ SOLN: 0.0000 mg | Freq: Four times a day (QID) | INTRAMUSCULAR | Status: DC

## 2014-06-08 MED ORDER — LORATADINE 10 MG PO TABS
10.0000 mg | ORAL_TABLET | Freq: Every day | ORAL | Status: DC
  Administered 2014-06-09 – 2014-06-11 (×3): 10 mg via ORAL
  Filled 2014-06-08 (×3): qty 1
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
## 2014-06-08 NOTE — ED Notes (Signed)
Pt presents with c/o alcohol detox. Pt reports that she has drank 1.75L of 100 proof alcohol throughout the day. Pt reports that she wants detox and wants to go into a program. Pt has cirrhosis of the liver. Denies SI/HI.

## 2014-06-08 NOTE — ED Notes (Signed)
Pt has been wanded by security. 

## 2014-06-09 ENCOUNTER — Encounter (HOSPITAL_COMMUNITY): Payer: Self-pay | Admitting: Psychiatry

## 2014-06-09 DIAGNOSIS — R45851 Suicidal ideations: Secondary | ICD-10-CM

## 2014-06-09 DIAGNOSIS — F4310 Post-traumatic stress disorder, unspecified: Secondary | ICD-10-CM

## 2014-06-09 DIAGNOSIS — F411 Generalized anxiety disorder: Secondary | ICD-10-CM

## 2014-06-09 DIAGNOSIS — F1010 Alcohol abuse, uncomplicated: Secondary | ICD-10-CM

## 2014-06-09 DIAGNOSIS — F1020 Alcohol dependence, uncomplicated: Secondary | ICD-10-CM | POA: Diagnosis present

## 2014-06-09 LAB — BASIC METABOLIC PANEL
Anion gap: 17 — ABNORMAL HIGH (ref 5–15)
Anion gap: 14 (ref 5–15)
BUN: 4 mg/dL — AB (ref 6–23)
BUN: 3 mg/dL — ABNORMAL LOW (ref 6–23)
CALCIUM: 8.1 mg/dL — AB (ref 8.4–10.5)
CO2: 22 meq/L (ref 19–32)
CO2: 21 meq/L (ref 19–32)
CREATININE: 0.57 mg/dL (ref 0.50–1.10)
CREATININE: 0.55 mg/dL (ref 0.50–1.10)
Calcium: 8.1 mg/dL — ABNORMAL LOW (ref 8.4–10.5)
Chloride: 99 mEq/L (ref 96–112)
Chloride: 103 mEq/L (ref 96–112)
GFR calc Af Amer: >90 mL/min (ref >90)
GFR calc Af Amer: >90 mL/min (ref >90)
GFR calc non Af Amer: >90 mL/min (ref >90)
GFR calc non Af Amer: >90 mL/min (ref >90)
GLUCOSE: 101 mg/dL — AB (ref 70–99)
GLUCOSE: 109 mg/dL — AB (ref 70–99)
Potassium: 2.8 mEq/L — CL (ref 3.7–5.3)
Potassium: 4.1 mEq/L (ref 3.7–5.3)
Sodium: 138 mEq/L (ref 137–147)
Sodium: 138 mEq/L (ref 137–147)

## 2014-06-09 LAB — MAGNESIUM: Magnesium: 1.9 mg/dL (ref 1.5–2.5)

## 2014-06-09 MED ORDER — SODIUM CHLORIDE 0.9 % IV BOLUS (SEPSIS)
1000.0000 mL | Freq: Once | INTRAVENOUS | Status: AC
  Administered 2014-06-09: 1000 mL via INTRAVENOUS

## 2014-06-09 MED ORDER — POTASSIUM CHLORIDE 10 MEQ/100ML IV SOLN
10.0000 meq | Freq: Once | INTRAVENOUS | Status: AC
  Administered 2014-06-09: 10 meq via INTRAVENOUS
  Filled 2014-06-09: qty 100

## 2014-06-09 MED ORDER — POTASSIUM CHLORIDE CRYS ER 20 MEQ PO TBCR: 40.0000 meq | EXTENDED_RELEASE_TABLET | Freq: Every day | ORAL | Status: DC

## 2014-06-09 MED ORDER — MAGNESIUM SULFATE IN D5W 10-5 MG/ML-% IV SOLN
1.0000 g | Freq: Once | INTRAVENOUS | Status: AC
  Administered 2014-06-09: 1 g via INTRAVENOUS
  Filled 2014-06-09: qty 100

## 2014-06-09 MED ORDER — POTASSIUM CHLORIDE CRYS ER 20 MEQ PO TBCR
40.0000 meq | EXTENDED_RELEASE_TABLET | Freq: Once | ORAL | Status: AC
  Administered 2014-06-09: 40 meq via ORAL
  Filled 2014-06-09: qty 2

## 2014-06-09 NOTE — ED Notes (Signed)
Pt to go to room 41

## 2014-06-09 NOTE — ED Provider Notes (Signed)
F/u on BMP shows K and MG both normalized. Remainder of labwork essentially normal. She is cleared from medical standpoint. Will continue with 3 days of K oral supplementation.  Viona Gilmore Doffing, PA-C 06/09/14 1102

## 2014-06-09 NOTE — ED Provider Notes (Signed)
Medical screening examination/treatment/procedure(s) were performed by non-physician practitioner and as supervising physician I was immediately available for consultation/collaboration.   EKG Interpretation None       Kalman Drape, MD 06/09/14 [phone]

## 2014-06-09 NOTE — BH Assessment (Signed)
Per Thayer Headings at [facility] patient declined for inpatient detox. Sts that patient's last suicide attempt must be 30+ days in the past. Unfortunately, patient's last suicide attempt was 2 weeks ago. Patient is not a candidate for [facility] at this time.
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
## 2014-06-09 NOTE — ED Notes (Signed)
Psych MD at bedside.  MD reported to Pt that [facility] does not provide detox, but [facility] will work on placement.

## 2014-06-09 NOTE — BH Assessment (Signed)
[facility] Assessment Progress Note Dwight at [facility] says no beds until Monday. Pt is under review.

## 2014-06-09 NOTE — BH Assessment (Signed)
Discussed clinicals with Jamie Colonel, NP and inpatient treatment was recommended. Pt referred to [facility] and [facility]. [facility] has no appropriate beds available at this time.

## 2014-06-09 NOTE — ED Notes (Signed)
Pt sleeping. Respirations even and unlabored. Bilateral rise and fall of chest. Skin warm and dry. In no acute distress.

## 2014-06-09 NOTE — BH Assessment (Signed)
[facility] Assessment Progress Note Sent referral to [facility], spoke with intake, and they received it.  Jamie Allen is to review when she can, but they think all beds are full.
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
## 2014-06-09 NOTE — ED Notes (Signed)
Patient is A&Ox4, cooperative, denies SI/HI, negative for A/V hallucinations.  Patient states she drinks 1.75L 100 proof vodka/day for the past 4 years, wants to go to a detox facility.  Patient has tremors, beads of sweat, states she has a headache, as well as her "liver" hurting.  Patient given emotional support and will be given her meds as scheduled and prn.  Patient remains safe.  Tanis Hensarling, [place]

## 2014-06-10 MED ORDER — FENTANYL CITRATE 0.05 MG/ML IJ SOLN: 50.0000 ug | Freq: Once | INTRAMUSCULAR | Status: DC

## 2014-06-10 MED ORDER — LOPERAMIDE HCL 2 MG PO CAPS: 2.0000 mg | ORAL_CAPSULE | ORAL | Status: DC | PRN

## 2014-06-10 MED ORDER — LORAZEPAM 1 MG PO TABS
1.0000 mg | ORAL_TABLET | ORAL | Status: AC
  Administered 2014-06-10: 1 mg via ORAL
  Filled 2014-06-10: qty 1

## 2014-06-10 MED ORDER — FENTANYL CITRATE 0.05 MG/ML IJ SOLN
50.0000 ug | Freq: Once | INTRAMUSCULAR | Status: AC
  Administered 2014-06-10: 50 ug via INTRAMUSCULAR
  Filled 2014-06-10: qty 2

## 2014-06-10 NOTE — ED Notes (Signed)
Up to the bathroom 

## 2014-06-10 NOTE — ED Notes (Signed)
Dr Harrington Challenger and Theodoro Clock into see

## 2014-06-10 NOTE — ED Notes (Signed)
Patient presents anxious and complaining of liver pain; denies SI/HI/AVH.NAD

## 2014-06-10 NOTE — ED Notes (Addendum)
No diarrhea at this time,  Pt encouraged to inform staff if it re-occurrs

## 2014-06-10 NOTE — ED Notes (Signed)
Dr Harrington Challenger aware that CIWA is scheduled along with one time ativan--give both per Dr Harrington Challenger.
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
## 2014-06-10 NOTE — ED Provider Notes (Signed)
Medical screening examination/treatment/procedure(s) were performed by non-physician practitioner and as supervising physician I was immediately available for consultation/collaboration.   EKG Interpretation None       Jamie Allen. Alvino Chapel, MD 06/10/14 [phone]

## 2014-06-10 NOTE — BH Assessment (Addendum)
Per Lavell Luster, [facility] at [facility], adult unit is currently at capacity. Contacted the following facilities for placement:  INFORMATION RECEIVED, AWAITING RESPONSE: [facility], per [facility], per [facility], per [facility]  AT CAPACITY: [facility], per [facility], per [facility], per [facility], per [facility], per [facility], per [facility], per [facility], per [facility], per [facility], per [facility], per [facility], per [facility], per Manuela Schwartz  PT DECLINED: [facility]  NO RESPONSE: [facility]   [place], [place], [facility] Triage Specialist [phone]

## 2014-06-11 ENCOUNTER — Encounter (HOSPITAL_COMMUNITY): Payer: Self-pay

## 2014-06-11 ENCOUNTER — Encounter (HOSPITAL_COMMUNITY): Payer: Self-pay | Admitting: Registered Nurse

## 2014-06-11 ENCOUNTER — Observation Stay (HOSPITAL_COMMUNITY)
Admission: AD | Admit: 2014-06-11 | Discharge: 2014-06-12 | Disposition: A | Discharge status: Home / Self Care | Payer: Self-pay | Source: Intra-hospital | Attending: Psychiatry | Admitting: Psychiatry

## 2014-06-11 DIAGNOSIS — F32A Depression, unspecified: Secondary | ICD-10-CM

## 2014-06-11 DIAGNOSIS — F329 Major depressive disorder, single episode, unspecified: Secondary | ICD-10-CM

## 2014-06-11 DIAGNOSIS — Z9289 Personal history of other medical treatment: Secondary | ICD-10-CM

## 2014-06-11 DIAGNOSIS — F10230 Alcohol dependence with withdrawal, uncomplicated: Secondary | ICD-10-CM

## 2014-06-11 DIAGNOSIS — F1910 Other psychoactive substance abuse, uncomplicated: Secondary | ICD-10-CM | POA: Insufficient documentation

## 2014-06-11 DIAGNOSIS — F411 Generalized anxiety disorder: Secondary | ICD-10-CM | POA: Insufficient documentation

## 2014-06-11 DIAGNOSIS — F419 Anxiety disorder, unspecified: Secondary | ICD-10-CM

## 2014-06-11 DIAGNOSIS — F4310 Post-traumatic stress disorder, unspecified: Secondary | ICD-10-CM | POA: Insufficient documentation

## 2014-06-11 DIAGNOSIS — Z09 Encounter for follow-up examination after completed treatment for conditions other than malignant neoplasm: Secondary | ICD-10-CM

## 2014-06-11 DIAGNOSIS — F1020 Alcohol dependence, uncomplicated: Principal | ICD-10-CM | POA: Insufficient documentation

## 2014-06-11 MED ORDER — LOPERAMIDE HCL 2 MG PO CAPS: 2.0000 mg | ORAL_CAPSULE | ORAL | Status: DC | PRN

## 2014-06-11 MED ORDER — MAGNESIUM HYDROXIDE 400 MG/5ML PO SUSP: 30.0000 mL | Freq: Every day | ORAL | Status: DC | PRN

## 2014-06-11 MED ORDER — LORATADINE 10 MG PO TABS
10.0000 mg | ORAL_TABLET | Freq: Every day | ORAL | Status: DC
  Administered 2014-06-12: 10 mg via ORAL
  Filled 2014-06-11 (×3): qty 1

## 2014-06-11 MED ORDER — ACETAMINOPHEN 325 MG PO TABS: 650.0000 mg | ORAL_TABLET | Freq: Four times a day (QID) | ORAL | Status: DC | PRN

## 2014-06-11 MED ORDER — DIPHENHYDRAMINE HCL 25 MG PO CAPS: 25.0000 mg | ORAL_CAPSULE | Freq: Four times a day (QID) | ORAL | Status: DC | PRN

## 2014-06-11 MED ORDER — SIMETHICONE 80 MG PO CHEW: 80.0000 mg | CHEWABLE_TABLET | Freq: Every day | ORAL | Status: DC | PRN

## 2014-06-11 MED ORDER — QUETIAPINE FUMARATE 300 MG PO TABS
300.0000 mg | ORAL_TABLET | Freq: Every day | ORAL | Status: DC
  Filled 2014-06-11: qty 1

## 2014-06-11 MED ORDER — NICOTINE 21 MG/24HR TD PT24
21.0000 mg | MEDICATED_PATCH | Freq: Every day | TRANSDERMAL | Status: DC
  Administered 2014-06-12: 21 mg via TRANSDERMAL
  Filled 2014-06-11 (×3): qty 1

## 2014-06-11 MED ORDER — PANTOPRAZOLE SODIUM 40 MG PO TBEC
80.0000 mg | DELAYED_RELEASE_TABLET | Freq: Every day | ORAL | Status: DC
  Administered 2014-06-12: 80 mg via ORAL
  Filled 2014-06-11 (×2): qty 2

## 2014-06-11 MED ORDER — VITAMIN B-1 100 MG PO TABS
100.0000 mg | ORAL_TABLET | Freq: Every day | ORAL | Status: DC
  Administered 2014-06-12: 100 mg via ORAL
  Filled 2014-06-11 (×3): qty 1

## 2014-06-11 NOTE — ED Notes (Signed)
Patient presents calm and cooperative, denies any current SI/HI/AVH. NAD

## 2014-06-11 NOTE — ED Notes (Signed)
Up to the bathroom 

## 2014-06-11 NOTE — ED Notes (Signed)
Up to the bathroom, coffee given

## 2014-06-11 NOTE — ED Notes (Signed)
Nad, resting quietly, reports pain is improved, no HA, abd pain back to normal level (5/10)

## 2014-06-11 NOTE — ED Notes (Signed)
In day room watching tv 

## 2014-06-11 NOTE — Discharge Instructions (Signed)
Alcohol Use Disorder °Alcohol use disorder is a mental disorder. It is not a one-time incident of heavy drinking. Alcohol use disorder is the excessive and uncontrollable use of alcohol over time that leads to problems with functioning in one or more areas of daily living. People with this disorder risk harming themselves and others when they drink to excess. Alcohol use disorder also can cause other mental disorders, such as mood and anxiety disorders, and serious physical problems. People with alcohol use disorder often misuse other drugs.  °Alcohol use disorder is common and widespread. Some people with this disorder drink alcohol to cope with or escape from negative life events. Others drink to relieve chronic pain or symptoms of mental illness. People with a family history of alcohol use disorder are at higher risk of losing control and using alcohol to excess.  °SYMPTOMS  °Signs and symptoms of alcohol use disorder may include the following:  °· Consumption of alcohol in larger amounts or over a longer period of time than intended. °· Multiple unsuccessful attempts to cut down or control alcohol use.   °· A great deal of time spent obtaining alcohol, using alcohol, or recovering from the effects of alcohol (hangover). °· A strong desire or urge to use alcohol (cravings).   °· Continued use of alcohol despite problems at work, school, or home because of alcohol use.   °· Continued use of alcohol despite problems in relationships because of alcohol use. °· Continued use of alcohol in situations when it is physically hazardous, such as driving a car. °· Continued use of alcohol despite awareness of a physical or psychological problem that is likely related to alcohol use. Physical problems related to alcohol use can involve the brain, heart, liver, stomach, and intestines. Psychological problems related to alcohol use include intoxication, depression, anxiety, psychosis, delirium, and dementia.   °· The need for  increased amounts of alcohol to achieve the same desired effect, or a decreased effect from the consumption of the same amount of alcohol (tolerance). °· Withdrawal symptoms upon reducing or stopping alcohol use, or alcohol use to reduce or avoid withdrawal symptoms. Withdrawal symptoms include: °¨ Racing heart. °¨ Hand tremor. °¨ Difficulty sleeping. °¨ Nausea. °¨ Vomiting. °¨ Hallucinations. °¨ Restlessness. °¨ Seizures. °DIAGNOSIS °Alcohol use disorder is diagnosed through an assessment by your health care provider. Your health care provider may start by asking three or four questions to screen for excessive or problematic alcohol use. To confirm a diagnosis of alcohol use disorder, at least two symptoms must be present within a 12-month period. The severity of alcohol use disorder depends on the number of symptoms: °· Mild--two or three. °· Moderate--four or five. °· Severe--six or more. °Your health care provider may perform a physical exam or use results from lab tests to see if you have physical problems resulting from alcohol use. Your health care provider may refer you to a mental health professional for evaluation. °TREATMENT  °Some people with alcohol use disorder are able to reduce their alcohol use to low-risk levels. Some people with alcohol use disorder need to quit drinking alcohol. When necessary, mental health professionals with specialized training in substance use treatment can help. Your health care provider can help you decide how severe your alcohol use disorder is and what type of treatment you need. The following forms of treatment are available:  °· Detoxification. Detoxification involves the use of prescription medicines to prevent alcohol withdrawal symptoms in the first week after quitting. This is important for people with a history of symptoms   of withdrawal and for heavy drinkers who are likely to have withdrawal symptoms. Alcohol withdrawal can be dangerous and, in severe cases, cause  death. Detoxification is usually provided in a hospital or in-patient substance use treatment facility.  Counseling or talk therapy. Talk therapy is provided by substance use treatment counselors. It addresses the reasons people use alcohol and ways to keep them from drinking again. The goals of talk therapy are to help people with alcohol use disorder find healthy activities and ways to cope with life stress, to identify and avoid triggers for alcohol use, and to handle cravings, which can cause relapse.  Medicines.Different medicines can help treat alcohol use disorder through the following actions:  Decrease alcohol cravings.  Decrease the positive reward response felt from alcohol use.  Produce an uncomfortable physical reaction when alcohol is used (aversion therapy).  Support groups. Support groups are run by people who have quit drinking. They provide emotional support, advice, and guidance. These forms of treatment are often combined. Some people with alcohol use disorder benefit from intensive combination treatment provided by specialized substance use treatment centers. Both inpatient and outpatient treatment programs are available. Document Released: 11/13/2004 Document Revised: 02/20/2014 Document Reviewed: 01/13/2013 [facility] Patient Information 2015 [place], [place]. This information is not intended to replace advice given to you by your health care provider. Make sure you discuss any questions you have with your health care provider.  Alcohol Intoxication Alcohol intoxication occurs when the amount of alcohol that a person has consumed impairs his or her ability to mentally and physically function. Alcohol directly impairs the normal chemical activity of the brain. Drinking large amounts of alcohol can lead to changes in mental function and behavior, and it can cause many physical effects that can be harmful.  Alcohol intoxication can range in severity from mild to very severe.  Various factors can affect the level of intoxication that occurs, such as the person's age, gender, weight, frequency of alcohol consumption, and the presence of other medical conditions (such as diabetes, seizures, or heart conditions). Dangerous levels of alcohol intoxication may occur when people drink large amounts of alcohol in a short period (binge drinking). Alcohol can also be especially dangerous when combined with certain prescription medicines or "recreational" drugs. SIGNS AND SYMPTOMS Some common signs and symptoms of mild alcohol intoxication include:  Loss of coordination.  Changes in mood and behavior.  Impaired judgment.  Slurred speech. As alcohol intoxication progresses to more severe levels, other signs and symptoms will appear. These may include:  Vomiting.  Confusion and impaired memory.  Slowed breathing.  Seizures.  Loss of consciousness. DIAGNOSIS  Your health care provider will take a medical history and perform a physical exam. You will be asked about the amount and type of alcohol you have consumed. Blood tests will be done to measure the concentration of alcohol in your blood. In many places, your blood alcohol level must be lower than 80 mg/dL (0.08%) to legally drive. However, many dangerous effects of alcohol can occur at much lower levels.  TREATMENT  People with alcohol intoxication often do not require treatment. Most of the effects of alcohol intoxication are temporary, and they go away as the alcohol naturally leaves the body. Your health care provider will monitor your condition until you are stable enough to go home. Fluids are sometimes given through an IV access tube to help prevent dehydration.  HOME CARE INSTRUCTIONS  Do not drive after drinking alcohol.  Stay hydrated. Drink enough water and  fluids to keep your urine clear or pale yellow. Avoid caffeine.   Only take over-the-counter or prescription medicines as directed by your health care  provider.  SEEK MEDICAL CARE IF:   You have persistent vomiting.   You do not feel better after a few days.  You have frequent alcohol intoxication. Your health care provider can help determine if you should see a substance use treatment counselor. SEEK IMMEDIATE MEDICAL CARE IF:   You become shaky or tremble when you try to stop drinking.   You shake uncontrollably (seizure).   You throw up (vomit) blood. This may be bright red or may look like black coffee grounds.   You have blood in your stool. This may be bright red or may appear as a black, tarry, bad smelling stool.   You become lightheaded or faint.  MAKE SURE YOU:   Understand these instructions.  Will watch your condition.  Will get help right away if you are not doing well or get worse. Document Released: 07/16/2005 Document Revised: 06/08/2013 Document Reviewed: 03/11/2013 [facility] Patient Information 2015 [place], [place]. This information is not intended to replace advice given to you by your health care provider. Make sure you discuss any questions you have with your health care provider.  Alcohol Withdrawal Anytime drug use is interfering with normal living activities it has become abuse. This includes problems with family and friends. Psychological dependence has developed when your mind tells you that the drug is needed. This is usually followed by physical dependence when a continuing increase of drugs are required to get the same feeling or "high." This is known as addiction or chemical dependency. A person's risk is much higher if there is a history of chemical dependency in the family. Mild Withdrawal Following Stopping Alcohol, When Addiction or Chemical Dependency Has Developed When a person has developed tolerance to alcohol, any sudden stopping of alcohol can cause uncomfortable physical symptoms. Most of the time these are mild and consist of tremors in the hands and increases in heart rate, breathing,  and temperature. Sometimes these symptoms are associated with anxiety, panic attacks, and bad dreams. There may also be stomach upset. Normal sleep patterns are often interrupted with periods of inability to sleep (insomnia). This may last for 6 months. Because of this discomfort, many people choose to continue drinking to get rid of this discomfort and to try to feel normal. Severe Withdrawal with Decreased or No Alcohol Intake, When Addiction or Chemical Dependency Has Developed About five percent of alcoholics will develop signs of severe withdrawal when they stop using alcohol. One sign of this is development of generalized seizures (convulsions). Other signs of this are severe agitation and confusion. This may be associated with believing in things which are not real or seeing things which are not really there (delusions and hallucinations). Vitamin deficiencies are usually present if alcohol intake has been long-term. Treatment for this most often requires hospitalization and close observation. Addiction can only be helped by stopping use of all chemicals. This is hard but may save your life. With continual alcohol use, possible outcomes are usually loss of self respect and esteem, violence, and death. Addiction cannot be cured but it can be stopped. This often requires outside help and the care of professionals. Treatment centers are listed in the yellow pages under Cocaine, Narcotics, and Alcoholics Anonymous. Most hospitals and clinics can refer you to a specialized care center. It is not necessary for you to go through the uncomfortable symptoms  of withdrawal. Your caregiver can provide you with medicines that will help you through this difficult period. Try to avoid situations, friends, or drugs that made it possible for you to keep using alcohol in the past. Learn how to say no. It takes a long period of time to overcome addictions to all drugs, including alcohol. There may be many times when you  feel as though you want a drink. After getting rid of the physical addiction and withdrawal, you will have a lessening of the craving which tells you that you need alcohol to feel normal. Call your caregiver if more support is needed. Learn who to talk to in your family and among your friends so that during these periods you can receive outside help. Alcoholics Anonymous (AA) has helped many people over the years. To get further help, contact AA or call your caregiver, counselor, or clergyperson. Al-Anon and Alateen are support groups for friends and family members of an alcoholic. The people who love and care for an alcoholic often need help, too. For information about these organizations, check your phone directory or call a local alcoholism treatment center.  SEEK IMMEDIATE MEDICAL CARE IF:   You have a seizure.  You have a fever.  You experience uncontrolled vomiting or you vomit up blood. This may be bright red or look like black coffee grounds.  You have blood in the stool. This may be bright red or appear as a black, tarry, bad-smelling stool.  You become lightheaded or faint. Do not drive if you feel this way. Have someone else drive you or call 378 for help.  You become more agitated or confused.  You develop uncontrolled anxiety.  You begin to see things that are not really there (hallucinate). Your caregiver has determined that you completely understand your medical condition, and that your mental state is back to normal. You understand that you have been treated for alcohol withdrawal, have agreed not to drink any alcohol for a minimum of 1 day, will not operate a car or other machinery for 24 hours, and have had an opportunity to ask any questions about your condition. Document Released: 07/16/2005 Document Revised: 12/29/2011 Document Reviewed: 05/24/2008 [facility] Patient Information 2015 [place], [place]. This information is not intended to replace advice given to you by your health  care provider. Make sure you discuss any questions you have with your health care provider.

## 2014-06-11 NOTE — ED Notes (Signed)
Pt sleeping soundly.  No fentanyl at this time per [facility] NP.  No obs bed available.  Pt will be dc'd home if not accepted to [facility] or [facility].  Pt is aware

## 2014-06-11 NOTE — Progress Notes (Signed)
CSW sent referrals to [facility] and [facility] for SA treatment.     Chesley Noon, MSW, [place], 06/11/2014 Evening Clinical Social Worker [phone]
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
## 2014-06-11 NOTE — ED Notes (Signed)
Dr Harrington Challenger and shuvon np into see

## 2014-06-11 NOTE — BH Assessment (Signed)
Per Lavell Luster, [facility], adult unit is currently at capacity. Contacted the following facilities for placement:   AT CAPACITY:  [place], per [facility], per Oswaldo Done, per [facility], per [facility], per [facility], per [facility], per [facility], per [facility], per [facility], per [facility], per [facility], per Annabell Howells, per [facility], per [place]   NO RESPONSE:  [facility]  PT DECLINED: [facility] [address] Anson Fret, [facility], [facility] Triage Specialist [phone]

## 2014-06-11 NOTE — ED Notes (Signed)
Transport to [facility] after 1900 per [facility]
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
## 2014-06-12 DIAGNOSIS — F10939 Alcohol use, unspecified with withdrawal, unspecified: Secondary | ICD-10-CM

## 2014-06-12 DIAGNOSIS — F1994 Other psychoactive substance use, unspecified with psychoactive substance-induced mood disorder: Secondary | ICD-10-CM

## 2014-06-12 DIAGNOSIS — F1020 Alcohol dependence, uncomplicated: Secondary | ICD-10-CM

## 2014-06-12 DIAGNOSIS — F10239 Alcohol dependence with withdrawal, unspecified: Secondary | ICD-10-CM

## 2014-06-12 MED ORDER — ESOMEPRAZOLE MAGNESIUM 40 MG PO CPDR: 40.0000 mg | DELAYED_RELEASE_CAPSULE | Freq: Every day | ORAL | Status: DC

## 2014-06-12 MED ORDER — SIMETHICONE 80 MG PO CHEW: 80.0000 mg | CHEWABLE_TABLET | Freq: Every day | ORAL | Status: DC | PRN

## 2014-06-12 MED ORDER — NICOTINE 14 MG/24HR TD PT24: 14.0000 mg | MEDICATED_PATCH | Freq: Every day | TRANSDERMAL | Status: DC

## 2014-06-12 MED ORDER — QUETIAPINE FUMARATE 50 MG PO TABS: 300.0000 mg | ORAL_TABLET | Freq: Every day | ORAL | Status: DC

## 2014-06-12 MED ORDER — LORATADINE 10 MG PO TABS: 10.0000 mg | ORAL_TABLET | Freq: Every day | ORAL | Status: DC

## 2014-06-12 MED ORDER — DIPHENHYDRAMINE HCL 25 MG PO TABS: 25.0000 mg | ORAL_TABLET | Freq: Four times a day (QID) | ORAL | Status: DC | PRN

## 2014-06-12 MED ORDER — POLYETHYLENE GLYCOL 3350 17 G PO PACK: 17.0000 g | PACK | Freq: Every day | ORAL | Status: DC

## 2014-06-12 NOTE — Progress Notes (Signed)
Patient ID: Jamie Allen, female   DOB: 1971-03-11, 43 y.o.   MRN: [id number] D-On awakening showered. Pleasant, states slept poorly last night and that has been ongoing issue for her. States not sleeping leads to alcohol use for her and from here she would like to go to a 30 day rehab program.  A-Support offered. Continue to monitor for safety. Medications as ordered. R-No complaints voiced. She is waiting for a provider to evaluate her and make recommendations for care after OBS.

## 2014-06-12 NOTE — BH Assessment (Signed)
Pt was provided with referral information for [facility] in [place]. MHT reviewed the referral information with pt. MHT contacted [facility] and spoke with Admissions RN, Melissa. No beds are available at this time, but Lenna Sciara will call back if one becomes available. Pt's RN made aware.  -Boyce Medici, MA  Disposition MHT

## 2014-06-12 NOTE — Plan of Care (Signed)
[facility] Observation Crisis Plan  Reason for Crisis Plan:  Substance Abuse   Plan of Care:  Referral for Substance Abuse  Family Support:   yes Sister    Current Living Environment:   Boyfriend is supportive  Insurance:   Hospital Account   Name Acct ID Class Status Primary Coverage   Jamie Allen, Jamie Allen [id number] [place] Open None        Guarantor Account (for Hospital Account [id number])   Name Relation to Pt Service Area Active? Acct Type   Jamie Allen Self [facility] Yes Behavioral Health   Address Phone       [place], [place] [address] [phone])          Coverage Information (for Hospital Account [id number])   Not on file      Legal Guardian:   patient  Primary Care Provider:  Lorayne Marek, MD  Current Outpatient Providers:  NA at this time  Psychiatrist:   NA  Counselor/Therapist:   NA  Compliant with Medications:  Yes  Additional Information: sees Dr. Micheline Allen PCP   Jamie Allen, Jamie Allen 8/24/201512:02 AM
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
## 2014-06-12 NOTE — Progress Notes (Signed)
Patient ID: Jamie Allen, female   DOB: [birth year], 43 y.o.   MRN: [id number] Discharge Note-Seen by Dr Sabra Heck, and determined that she can be discharged from the OBS unit and referred to a rehab program like [facility] or [facility]. Elberta Fortis is working on her disposition. She notified her boyfriend of her disposition. She is currently receiving the orange card through [facility] for her only healthcare. She states she needs rehab for her long history of alcohol use and cocaine use.She has health concerns related to her long term use. She denies any thoughts to hurt self or others and is not psychotic.[facility] declined her because they dont provide rehab services, [facility] because she has had a suicide attempt within the last 2 wks and they require no attempts within 30 days. She made an appt with [facility] while here for Wed the 26th at [facility]. All her belongings were returned to her and escorted to lobby for her boyfriend to pick her up.She has no Seroquel and wanting to be discharged early enough to get to [facility] to fill RX if we would give her one. Dr. Sabra Heck consulted and he wrote her an RX for three days only to fill until she can get to [facility].Called to check on the price and one pill was 13.00. She states she believes [facility] is open until 6pm so she was planning to hurry over there to get the Rx filled and then follow up as a walk in tomorrow at [facility] for follow up.as a result of being here in OBS and to get an Rx.Escorted to lobby for discharge home.

## 2014-06-12 NOTE — BH Assessment (Signed)
[facility] - Per Melissa, denied because, "We detox within 72 hours and pt has been in the hospital since 06/10/14, so she should already be detoxed." Pt's RN made aware.  -Boyce Medici, MA  Disposition MHT

## 2014-06-12 NOTE — Discharge Summary (Signed)
  43 Y/O female with a long standing history of alcohol Dependence, Substance Abuse, Anxiety, PTSD who came requesting help with alcohol dependence. (See admission assessment). She had been drinking every day. She was detox uneventfully. Now fully detox she is requesting being admitted to a residential treatment center. She states she has a safe place to wait for a bed to be available to one of these programs. She is in full contact with reality exhibits not active S/S of withdrawal. She denies any SI plans or intent. Will D/C and refer to a residential treatment facility. Geralyn Flash A. Sabra Heck, M.D.

## 2014-06-12 NOTE — BH Assessment (Signed)
Open bed at [facility], per Lenna Sciara, [place]. Pt's referral faxed. Open bed at [facility], referral faxed.  Boyce Medici, MA  Disposition MHT

## 2014-06-12 NOTE — Progress Notes (Signed)
[facility] INPATIENT:  Family/Significant Other Suicide Prevention Education  Suicide Prevention Education:  Patient Refusal for Family/Significant Other Suicide Prevention Education: The patient Jamie Allen has refused to provide written consent for family/significant other to be provided Family/Significant Other Suicide Prevention Education during admission and/or prior to discharge.  Physician notified.  Gala Romney 06/12/2014, 12:35 AM

## 2014-06-12 NOTE — Progress Notes (Addendum)
Pt. Presented to [facility] after a 72 hr. Stay in the [place].  Pt. Initially admitted with complaints of abdominal pain and reported no BM for 2 weeks.  Pt. Later requested inpatient treatment for alcohol abuse. Pt. Reports she has been drinking since she was 43years of age but has been drinking heavy for the last 4 years. Reporting she drinks 1.75 liters of 100   proof vodka daily.  Pt. Reports she lost her job 2 months ago d/t being hospitalized with c-dif, which has caused her to be depressed, states her roommate is paying her bills until she finds a new job.  Pt. Told Probation officer she lives with her boyfriend who is supportive.  Pt. Was inpatient in [place] in 1997 for crack cocaine use, which she has not used in 7 years. Pt. Has had 2 seizures in the past that were due to withdrawals.  Pt. Is allergic to Lonexol, morphine, and oxycodone,  History of fatty liver and liver pain per pt.  Med hx includes NASH(Nonalcoholic Steatohepatitis), PTSD, HTN, Immune deficiency, GERD, anemia, Headaches, GAD,and colitis.   Pt. Had received her HS medications in the [facility]  Prior to transport and requested a speedy admission d/t pt. Being sleepy. Pt. Was escorted to the OBS unit where she was offered food and fluids

## 2014-06-21 ENCOUNTER — Telehealth: Payer: Self-pay | Admitting: Internal Medicine

## 2014-06-21 NOTE — Telephone Encounter (Signed)
Pt husband stated Pt need note for Facility  stating it is ok to give Nexium 40mg . If all possibles to get note today.

## 2014-06-21 NOTE — Telephone Encounter (Signed)
Pt called back stating that she normally takes the over the counter medication for esomeprazole (NEXIUM) 40 MG capsule. He wanted to clarify that either a note or a prescription is fine, whatever is easiest for the doctor. Please follow up with pt in regards to details.

## 2014-06-21 NOTE — Telephone Encounter (Signed)
Pts husband is here to request a doctors note stating that Jamie Allen needs to take esomeprazole (NEXIUM) 40 MG capsule. She is having very low apetite and can hardly eat due to acid. She is currently at a treatment center and is not allowed to have this medication and instead they are giving her another medication called malanta? That she says does not work.

## 2014-06-21 NOTE — Telephone Encounter (Signed)
Pt's husband is calling because he states that pt. Was admitted for treatment into a facility. Husband also states that pt. Needs to take esomeprazole (NEXIUM) 40 MG capsule but the facility will not approve unless there is an order from the doctor. Please f/u with husband.

## 2014-06-23 NOTE — Telephone Encounter (Signed)
Pt.'s husband calling to check on status of note for facility. Please f/u.

## 2014-06-28 ENCOUNTER — Encounter: Payer: Self-pay | Admitting: Internal Medicine
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
## 2014-06-28 NOTE — Telephone Encounter (Signed)
Pt husband requested a letter stating Pt [place] can take OTC Nexium 40mg . Pt is in a Treatment Center.

## 2014-06-28 NOTE — Telephone Encounter (Signed)
Letter Fax to [employer] [phone]. Copy in [place] office, Pt husband will pick it up today

## 2014-07-04 ENCOUNTER — Telehealth: Payer: Self-pay | Admitting: *Deleted

## 2014-07-04 NOTE — Telephone Encounter (Signed)
Left message on VM to return call only if still in need of letter/Rx to allow patient to take nexium in treatment center.

## 2014-07-05 ENCOUNTER — Ambulatory Visit: Payer: Self-pay | Admitting: Gastroenterology

## 2014-08-04 ENCOUNTER — Ambulatory Visit: Payer: Self-pay | Admitting: Gastroenterology

## 2014-10-03 ENCOUNTER — Telehealth: Payer: Self-pay

## 2014-10-03 NOTE — Telephone Encounter (Signed)
Returned patient call Patient had called stating she needed an appointment to be seen Patient not available Left message on machine to return our call

## 2014-11-17 ENCOUNTER — Ambulatory Visit: Payer: Self-pay | Attending: Internal Medicine

## 2014-11-27 ENCOUNTER — Ambulatory Visit: Payer: Self-pay | Attending: Internal Medicine | Admitting: Internal Medicine

## 2014-11-27 ENCOUNTER — Encounter: Payer: Self-pay | Admitting: Internal Medicine

## 2014-11-27 VITALS — BP 106/70 | HR 82 | Temp 98.8°F | Resp 16 | Wt 144.0 lb

## 2014-11-27 DIAGNOSIS — Z1231 Encounter for screening mammogram for malignant neoplasm of breast: Secondary | ICD-10-CM

## 2014-11-27 DIAGNOSIS — R635 Abnormal weight gain: Secondary | ICD-10-CM

## 2014-11-27 DIAGNOSIS — I10 Essential (primary) hypertension: Secondary | ICD-10-CM | POA: Insufficient documentation

## 2014-11-27 DIAGNOSIS — K219 Gastro-esophageal reflux disease without esophagitis: Secondary | ICD-10-CM | POA: Insufficient documentation

## 2014-11-27 DIAGNOSIS — Z658 Other specified problems related to psychosocial circumstances: Secondary | ICD-10-CM

## 2014-11-27 DIAGNOSIS — Z720 Tobacco use: Secondary | ICD-10-CM

## 2014-11-27 DIAGNOSIS — F17200 Nicotine dependence, unspecified, uncomplicated: Secondary | ICD-10-CM

## 2014-11-27 DIAGNOSIS — K7581 Nonalcoholic steatohepatitis (NASH): Secondary | ICD-10-CM | POA: Insufficient documentation

## 2014-11-27 DIAGNOSIS — Z87898 Personal history of other specified conditions: Secondary | ICD-10-CM

## 2014-11-27 DIAGNOSIS — Z23 Encounter for immunization: Secondary | ICD-10-CM

## 2014-11-27 DIAGNOSIS — F17210 Nicotine dependence, cigarettes, uncomplicated: Secondary | ICD-10-CM | POA: Insufficient documentation

## 2014-11-27 DIAGNOSIS — F419 Anxiety disorder, unspecified: Secondary | ICD-10-CM | POA: Insufficient documentation

## 2014-11-27 DIAGNOSIS — Z79899 Other long term (current) drug therapy: Secondary | ICD-10-CM | POA: Insufficient documentation

## 2014-11-27 LAB — COMPLETE METABOLIC PANEL WITH GFR
ALT: 84 U/L — ABNORMAL HIGH (ref 0–35)
AST: 155 U/L — ABNORMAL HIGH (ref 0–37)
Albumin: 4.1 g/dL (ref 3.5–5.2)
Alkaline Phosphatase: 122 U/L — ABNORMAL HIGH (ref 39–117)
BUN: 4 mg/dL — ABNORMAL LOW (ref 6–23)
CHLORIDE: 98 meq/L (ref 96–112)
CO2: 24 mEq/L (ref 19–32)
CREATININE: 0.85 mg/dL (ref 0.50–1.10)
Calcium: 9.5 mg/dL (ref 8.4–10.5)
GFR, EST NON AFRICAN AMERICAN: 84 mL/min
GFR, Est African American: >89 mL/min
GLUCOSE: 77 mg/dL (ref 70–99)
Potassium: 4.9 mEq/L (ref 3.5–5.3)
SODIUM: 135 meq/L (ref 135–145)
Total Bilirubin: 0.5 mg/dL (ref 0.2–1.2)
Total Protein: 6.8 g/dL (ref 6.0–8.3)

## 2014-11-27 LAB — TSH: TSH: 2.306 u[IU]/mL (ref 0.350–4.500)

## 2014-11-27 MED ORDER — RANITIDINE HCL 150 MG PO TABS: 150.0000 mg | ORAL_TABLET | Freq: Every day | ORAL | Status: DC
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
## 2014-11-27 NOTE — Progress Notes (Signed)
Patient here for follow up Patient states she has a liver disease called "nash" And her liver has been swollen and painful for the past two months Patient has gained weight as well Patient is requesting blood work today as well

## 2014-11-27 NOTE — Patient Instructions (Signed)
Smoking Cessation Quitting smoking is important to your health and has many advantages. However, it is not always easy to quit since nicotine is a very addictive drug. Oftentimes, people try 3 times or more before being able to quit. This document explains the best ways for you to prepare to quit smoking. Quitting takes hard work and a lot of effort, but you can do it. ADVANTAGES OF QUITTING SMOKING  You will live longer, feel better, and live better.  Your body will feel the impact of quitting smoking almost immediately.  Within 20 minutes, blood pressure decreases. Your pulse returns to its normal level.  After 8 hours, carbon monoxide levels in the blood return to normal. Your oxygen level increases.  After 24 hours, the chance of having a heart attack starts to decrease. Your breath, hair, and body stop smelling like smoke.  After 48 hours, damaged nerve endings begin to recover. Your sense of taste and smell improve.  After 72 hours, the body is virtually free of nicotine. Your bronchial tubes relax and breathing becomes easier.  After 2 to 12 weeks, lungs can hold more air. Exercise becomes easier and circulation improves.  The risk of having a heart attack, stroke, cancer, or lung disease is greatly reduced.  After 1 year, the risk of coronary heart disease is cut in half.  After 5 years, the risk of stroke falls to the same as a nonsmoker.  After 10 years, the risk of lung cancer is cut in half and the risk of other cancers decreases significantly.  After 15 years, the risk of coronary heart disease drops, usually to the level of a nonsmoker.  If you are pregnant, quitting smoking will improve your chances of having a healthy baby.  The people you live with, especially any children, will be healthier.  You will have extra money to spend on things other than cigarettes. QUESTIONS TO THINK ABOUT BEFORE ATTEMPTING TO QUIT You may want to talk about your answers with your  health care provider.  Why do you want to quit?  If you tried to quit in the past, what helped and what did not?  What will be the most difficult situations for you after you quit? How will you plan to handle them?  Who can help you through the tough times? Your family? Friends? A health care provider?  What pleasures do you get from smoking? What ways can you still get pleasure if you quit? Here are some questions to ask your health care provider:  How can you help me to be successful at quitting?  What medicine do you think would be best for me and how should I take it?  What should I do if I need more help?  What is smoking withdrawal like? How can I get information on withdrawal? GET READY  Set a quit date.  Change your environment by getting rid of all cigarettes, ashtrays, matches, and lighters in your home, car, or work. Do not let people smoke in your home.  Review your past attempts to quit. Think about what worked and what did not. GET SUPPORT AND ENCOURAGEMENT You have a better chance of being successful if you have help. You can get support in many ways.  Tell your family, friends, and coworkers that you are going to quit and need their support. Ask them not to smoke around you.  Get individual, group, or telephone counseling and support. Programs are available at local hospitals and health centers. Call   your local health department for information about programs in your area.  Spiritual beliefs and practices may help some smokers quit.  Download a "quit meter" on your computer to keep track of quit statistics, such as how long you have gone without smoking, cigarettes not smoked, and money saved.  Get a self-help book about quitting smoking and staying off tobacco. LEARN NEW SKILLS AND BEHAVIORS  Distract yourself from urges to smoke. Talk to someone, go for a walk, or occupy your time with a task.  Change your normal routine. Take a different route to work.  Drink tea instead of coffee. Eat breakfast in a different place.  Reduce your stress. Take a hot bath, exercise, or read a book.  Plan something enjoyable to do every day. Reward yourself for not smoking.  Explore interactive web-based programs that specialize in helping you quit. GET MEDICINE AND USE IT CORRECTLY Medicines can help you stop smoking and decrease the urge to smoke. Combining medicine with the above behavioral methods and support can greatly increase your chances of successfully quitting smoking.  Nicotine replacement therapy helps deliver nicotine to your body without the negative effects and risks of smoking. Nicotine replacement therapy includes nicotine gum, lozenges, inhalers, nasal sprays, and skin patches. Some may be available over-the-counter and others require a prescription.  Antidepressant medicine helps people abstain from smoking, but how this works is unknown. This medicine is available by prescription.  Nicotinic receptor partial agonist medicine simulates the effect of nicotine in your brain. This medicine is available by prescription. Ask your health care provider for advice about which medicines to use and how to use them based on your health history. Your health care provider will tell you what side effects to look out for if you choose to be on a medicine or therapy. Carefully read the information on the package. Do not use any other product containing nicotine while using a nicotine replacement product.  RELAPSE OR DIFFICULT SITUATIONS Most relapses occur within the first 3 months after quitting. Do not be discouraged if you start smoking again. Remember, most people try several times before finally quitting. You may have symptoms of withdrawal because your body is used to nicotine. You may crave cigarettes, be irritable, feel very hungry, cough often, get headaches, or have difficulty concentrating. The withdrawal symptoms are only temporary. They are strongest  when you first quit, but they will go away within 10-14 days. To reduce the chances of relapse, try to:  Avoid drinking alcohol. Drinking lowers your chances of successfully quitting.  Reduce the amount of caffeine you consume. Once you quit smoking, the amount of caffeine in your body increases and can give you symptoms, such as a rapid heartbeat, sweating, and anxiety.  Avoid smokers because they can make you want to smoke.  Do not let weight gain distract you. Many smokers will gain weight when they quit, usually less than 10 pounds. Eat a healthy diet and stay active. You can always lose the weight gained after you quit.  Find ways to improve your mood other than smoking. FOR MORE INFORMATION  www.smokefree.gov  Document Released: 09/30/2001 Document Revised: 02/20/2014 Document Reviewed: 01/15/2012 ExitCare Patient Information 2015 [employer]. This information is not intended to replace advice given to you by your health care provider. Make sure you discuss any questions you have with your health care provider.  

## 2014-11-28 ENCOUNTER — Telehealth: Payer: Self-pay

## 2014-11-28 DIAGNOSIS — K769 Liver disease, unspecified: Secondary | ICD-10-CM

## 2014-11-28 NOTE — Telephone Encounter (Signed)
-----   Message from Lorayne Marek, MD sent at 11/28/2014  9:16 AM EST ----- Call and let the patient know, there is improvement in her liver enzymes but is still elevated, advise patient to avoid alcohol, patient has already been checked for hepatitis panel which has been negative. Also her TSH level is in normal range.

## 2014-11-28 NOTE — Telephone Encounter (Signed)
Pt prefers to be contacted on home phone number. Please f/u with pt.

## 2015-02-05 ENCOUNTER — Ambulatory Visit: Payer: Self-pay | Attending: Internal Medicine | Admitting: Family Medicine

## 2015-02-05 ENCOUNTER — Telehealth: Payer: Self-pay | Admitting: *Deleted

## 2015-02-05 VITALS — BP 115/77 | HR 120 | Temp 98.6°F | Resp 18

## 2015-02-05 DIAGNOSIS — R21 Rash and other nonspecific skin eruption: Secondary | ICD-10-CM

## 2015-02-05 MED ORDER — CLOTRIMAZOLE-BETAMETHASONE 1-0.05 % EX CREA: 1.0000 "application " | TOPICAL_CREAM | Freq: Two times a day (BID) | CUTANEOUS | Status: DC

## 2015-02-05 NOTE — Progress Notes (Signed)
Complaining of rash on breast bilateral Pain level # 7  Redness and burning x 3 weeks

## 2015-02-05 NOTE — Telephone Encounter (Signed)
Patient called in complaining of a rash on both breasts and "shooting pains" in both breasts as well.  Instructed patient to come in to our walk in clinic today.  Patient was in agreement.

## 2015-02-05 NOTE — Progress Notes (Signed)
Subjective:     Patient ID: Jamie Allen, female   DOB: 12/10/1970, 44 y.o.   MRN: [id number]    CC:  Rash on breast  HPI  Review of Systems   Patient presents with a three week history of rash on her breasts bilaterally. It is itchy and she is having some shooting pains in breast. She denies fever chills. She does say there have at times been some small blisters.     Objective:   Physical Exam   Alert, oriented, appropriate, in no distress. There is redness, minor roughness and few scabbed lesions on the botton of breast. There is no rash in the crease of the breast and trunk and non on the trunk There is seems to be tenderness of the breast bilaterally but no lumps appreciated.     Assessment:     Rash, unspecified    Plan:   Lotrisone, 30 grams, to be applied bid for 10-14 days. Follow-up if rash not resolving over next week.

## 2015-02-05 NOTE — Patient Instructions (Signed)
Follow-up if not improving in 7-10 days.

## 2015-02-27 ENCOUNTER — Emergency Department (HOSPITAL_COMMUNITY)
Admission: EM | Admit: 2015-02-27 | Discharge: 2015-02-27 | Disposition: A | Discharge status: Home / Self Care | Payer: Self-pay | Attending: Emergency Medicine | Admitting: Emergency Medicine

## 2015-02-27 ENCOUNTER — Encounter (HOSPITAL_COMMUNITY): Payer: Self-pay | Admitting: Cardiology

## 2015-02-27 ENCOUNTER — Emergency Department (HOSPITAL_COMMUNITY): Payer: Self-pay

## 2015-02-27 DIAGNOSIS — F4310 Post-traumatic stress disorder, unspecified: Secondary | ICD-10-CM | POA: Insufficient documentation

## 2015-02-27 DIAGNOSIS — F419 Anxiety disorder, unspecified: Secondary | ICD-10-CM | POA: Insufficient documentation

## 2015-02-27 DIAGNOSIS — R197 Diarrhea, unspecified: Secondary | ICD-10-CM

## 2015-02-27 DIAGNOSIS — K7581 Nonalcoholic steatohepatitis (NASH): Secondary | ICD-10-CM | POA: Insufficient documentation

## 2015-02-27 DIAGNOSIS — Z3202 Encounter for pregnancy test, result negative: Secondary | ICD-10-CM | POA: Insufficient documentation

## 2015-02-27 DIAGNOSIS — Z79899 Other long term (current) drug therapy: Secondary | ICD-10-CM | POA: Insufficient documentation

## 2015-02-27 DIAGNOSIS — R112 Nausea with vomiting, unspecified: Secondary | ICD-10-CM

## 2015-02-27 DIAGNOSIS — I10 Essential (primary) hypertension: Secondary | ICD-10-CM | POA: Insufficient documentation

## 2015-02-27 DIAGNOSIS — K219 Gastro-esophageal reflux disease without esophagitis: Secondary | ICD-10-CM | POA: Insufficient documentation

## 2015-02-27 DIAGNOSIS — K760 Fatty (change of) liver, not elsewhere classified: Secondary | ICD-10-CM

## 2015-02-27 DIAGNOSIS — Z720 Tobacco use: Secondary | ICD-10-CM | POA: Insufficient documentation

## 2015-02-27 DIAGNOSIS — R160 Hepatomegaly, not elsewhere classified: Secondary | ICD-10-CM

## 2015-02-27 DIAGNOSIS — R1084 Generalized abdominal pain: Secondary | ICD-10-CM

## 2015-02-27 DIAGNOSIS — Z862 Personal history of diseases of the blood and blood-forming organs and certain disorders involving the immune mechanism: Secondary | ICD-10-CM | POA: Insufficient documentation

## 2015-02-27 LAB — CBC WITH DIFFERENTIAL/PLATELET
BASOS PCT: 1 % (ref 0–1)
Basophils Absolute: 0.1 10*3/uL (ref 0.0–0.1)
EOS ABS: 0.2 10*3/uL (ref 0.0–0.7)
EOS PCT: 2 % (ref 0–5)
HEMATOCRIT: 40.9 % (ref 36.0–46.0)
HEMOGLOBIN: 14.7 g/dL (ref 12.0–15.0)
LYMPHS ABS: 1.6 10*3/uL (ref 0.7–4.0)
Lymphocytes Relative: 17 % (ref 12–46)
MCH: 38.5 pg — AB (ref 26.0–34.0)
MCHC: 35.9 g/dL (ref 30.0–36.0)
MCV: 107.1 fL — ABNORMAL HIGH (ref 78.0–100.0)
MONO ABS: 0.5 10*3/uL (ref 0.1–1.0)
Monocytes Relative: 5 % (ref 3–12)
Neutro Abs: 7.1 10*3/uL (ref 1.7–7.7)
Neutrophils Relative %: 75 % (ref 43–77)
Platelets: 275 10*3/uL (ref 150–400)
RBC: 3.82 MIL/uL — ABNORMAL LOW (ref 3.87–5.11)
RDW: 15.5 % (ref 11.5–15.5)
WBC: 9.4 10*3/uL (ref 4.0–10.5)

## 2015-02-27 LAB — COMPREHENSIVE METABOLIC PANEL
ALT: 51 U/L (ref 14–54)
AST: 85 U/L — ABNORMAL HIGH (ref 15–41)
Albumin: 3.5 g/dL (ref 3.5–5.0)
Alkaline Phosphatase: 124 U/L (ref 38–126)
Anion gap: 13 (ref 5–15)
BUN: <5 mg/dL — ABNORMAL LOW (ref 6–20)
CALCIUM: 9.7 mg/dL (ref 8.9–10.3)
CO2: 23 mmol/L (ref 22–32)
Chloride: 98 mmol/L — ABNORMAL LOW (ref 101–111)
Creatinine, Ser: 0.83 mg/dL (ref 0.44–1.00)
GFR calc Af Amer: >60 mL/min (ref >60)
GFR calc non Af Amer: >60 mL/min (ref >60)
Glucose, Bld: 100 mg/dL — ABNORMAL HIGH (ref 70–99)
Potassium: 3.9 mmol/L (ref 3.5–5.1)
SODIUM: 134 mmol/L — AB (ref 135–145)
TOTAL PROTEIN: 7.8 g/dL (ref 6.5–8.1)
Total Bilirubin: 0.8 mg/dL (ref 0.3–1.2)

## 2015-02-27 LAB — URINE MICROSCOPIC-ADD ON

## 2015-02-27 LAB — URINALYSIS, ROUTINE W REFLEX MICROSCOPIC
Glucose, UA: NEGATIVE mg/dL
KETONES UR: 15 mg/dL — AB
Leukocytes, UA: NEGATIVE
Nitrite: NEGATIVE
PROTEIN: NEGATIVE mg/dL
Specific Gravity, Urine: 1.021 (ref 1.005–1.030)
UROBILINOGEN UA: 1 mg/dL (ref 0.0–1.0)
pH: 6 (ref 5.0–8.0)

## 2015-02-27 LAB — LIPASE, BLOOD: Lipase: 72 U/L — ABNORMAL HIGH (ref 22–51)

## 2015-02-27 LAB — POC URINE PREG, ED: Preg Test, Ur: NEGATIVE

## 2015-02-27 MED ORDER — PROMETHAZINE HCL 25 MG/ML IJ SOLN
25.0000 mg | Freq: Once | INTRAMUSCULAR | Status: AC
  Administered 2015-02-27: 25 mg via INTRAVENOUS
  Filled 2015-02-27: qty 1

## 2015-02-27 MED ORDER — PROMETHAZINE HCL 25 MG PO TABS: 25.0000 mg | ORAL_TABLET | Freq: Four times a day (QID) | ORAL | Status: DC | PRN

## 2015-02-27 MED ORDER — MORPHINE SULFATE 4 MG/ML IJ SOLN
4.0000 mg | Freq: Once | INTRAMUSCULAR | Status: AC
  Administered 2015-02-27: 4 mg via INTRAVENOUS
  Filled 2015-02-27: qty 1

## 2015-02-27 MED ORDER — DICYCLOMINE HCL 20 MG PO TABS
20.0000 mg | ORAL_TABLET | Freq: Two times a day (BID) | ORAL | Status: DC
Start: 2015-02-27 — End: 2015-08-17

## 2015-02-27 MED ORDER — ONDANSETRON HCL 4 MG/2ML IJ SOLN
4.0000 mg | Freq: Once | INTRAMUSCULAR | Status: AC
  Administered 2015-02-27: 4 mg via INTRAVENOUS
  Filled 2015-02-27: qty 2

## 2015-02-27 MED ORDER — DICYCLOMINE HCL 10 MG/ML IM SOLN
20.0000 mg | Freq: Once | INTRAMUSCULAR | Status: AC
  Administered 2015-02-27: 20 mg via INTRAMUSCULAR
  Filled 2015-02-27: qty 2

## 2015-02-27 MED ORDER — SODIUM CHLORIDE 0.9 % IV BOLUS (SEPSIS)
1000.0000 mL | Freq: Once | INTRAVENOUS | Status: AC
  Administered 2015-02-27: 1000 mL via INTRAVENOUS

## 2015-02-27 NOTE — Discharge Instructions (Signed)
Abdominal Pain, Women °Abdominal (stomach, pelvic, or belly) pain can be caused by many things. It is important to tell your doctor: °· The location of the pain. °· Does it come and go or is it present all the time? °· Are there things that start the pain (eating certain foods, exercise)? °· Are there other symptoms associated with the pain (fever, nausea, vomiting, diarrhea)? °All of this is helpful to know when trying to find the cause of the pain. °CAUSES  °· Stomach: virus or bacteria infection, or ulcer. °· Intestine: appendicitis (inflamed appendix), regional ileitis (Crohn's disease), ulcerative colitis (inflamed colon), irritable bowel syndrome, diverticulitis (inflamed diverticulum of the colon), or cancer of the stomach or intestine. °· Gallbladder disease or stones in the gallbladder. °· Kidney disease, kidney stones, or infection. °· Pancreas infection or cancer. °· Fibromyalgia (pain disorder). °· Diseases of the female organs: °¨ Uterus: fibroid (non-cancerous) tumors or infection. °¨ Fallopian tubes: infection or tubal pregnancy. °¨ Ovary: cysts or tumors. °¨ Pelvic adhesions (scar tissue). °¨ Endometriosis (uterus lining tissue growing in the pelvis and on the pelvic organs). °¨ Pelvic congestion syndrome (female organs filling up with blood just before the menstrual period). °¨ Pain with the menstrual period. °¨ Pain with ovulation (producing an egg). °¨ Pain with an IUD (intrauterine device, birth control) in the uterus. °¨ Cancer of the female organs. °· Functional pain (pain not caused by a disease, may improve without treatment). °· Psychological pain. °· Depression. °DIAGNOSIS  °Your doctor will decide the seriousness of your pain by doing an examination. °· Blood tests. °· X-rays. °· Ultrasound. °· CT scan (computed tomography, special type of X-ray). °· MRI (magnetic resonance imaging). °· Cultures, for infection. °· Barium enema (dye inserted in the large intestine, to better view it with  X-rays). °· Colonoscopy (looking in intestine with a lighted tube). °· Laparoscopy (minor surgery, looking in abdomen with a lighted tube). °· Major abdominal exploratory surgery (looking in abdomen with a large incision). °TREATMENT  °The treatment will depend on the cause of the pain.  °· Many cases can be observed and treated at home. °· Over-the-counter medicines recommended by your caregiver. °· Prescription medicine. °· Antibiotics, for infection. °· Birth control pills, for painful periods or for ovulation pain. °· Hormone treatment, for endometriosis. °· Nerve blocking injections. °· Physical therapy. °· Antidepressants. °· Counseling with a psychologist or psychiatrist. °· Minor or major surgery. °HOME CARE INSTRUCTIONS  °· Do not take laxatives, unless directed by your caregiver. °· Take over-the-counter pain medicine only if ordered by your caregiver. Do not take aspirin because it can cause an upset stomach or bleeding. °· Try a clear liquid diet (broth or water) as ordered by your caregiver. Slowly move to a bland diet, as tolerated, if the pain is related to the stomach or intestine. °· Have a thermometer and take your temperature several times a day, and record it. °· Bed rest and sleep, if it helps the pain. °· Avoid sexual intercourse, if it causes pain. °· Avoid stressful situations. °· Keep your follow-up appointments and tests, as your caregiver orders. °· If the pain does not go away with medicine or surgery, you may try: °¨ Acupuncture. °¨ Relaxation exercises (yoga, meditation). °¨ Group therapy. °¨ Counseling. °SEEK MEDICAL CARE IF:  °· You notice certain foods cause stomach pain. °· Your home care treatment is not helping your pain. °· You need stronger pain medicine. °· You want your IUD removed. °· You feel faint or   lightheaded. °· You develop nausea and vomiting. °· You develop a rash. °· You are having side effects or an allergy to your medicine. °SEEK IMMEDIATE MEDICAL CARE IF:  °· Your  pain does not go away or gets worse. °· You have a fever. °· Your pain is felt only in portions of the abdomen. The right side could possibly be appendicitis. The left lower portion of the abdomen could be colitis or diverticulitis. °· You are passing blood in your stools (bright red or black tarry stools, with or without vomiting). °· You have blood in your urine. °· You develop chills, with or without a fever. °· You pass out. °MAKE SURE YOU:  °· Understand these instructions. °· Will watch your condition. °· Will get help right away if you are not doing well or get worse. °Document Released: 08/03/2007 Document Revised: 02/20/2014 Document Reviewed: 08/23/2009 °ExitCare® Patient Information ©2015 [employer]. This information is not intended to replace advice given to you by your health care provider. Make sure you discuss any questions you have with your health care provider. ° °

## 2015-02-27 NOTE — ED Notes (Signed)
MD at bedside. 

## 2015-02-27 NOTE — ED Notes (Signed)
Pt reports that she has been having abd pain and n/v for the past couple of days. States that she thinks her liver is enlarged.
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
## 2015-02-27 NOTE — ED Notes (Signed)
Pt advised she can take morphine, reports she has had some itching in the past but pt states she is okay to take it.

## 2015-06-12 ENCOUNTER — Ambulatory Visit: Payer: Self-pay

## 2015-06-24 ENCOUNTER — Emergency Department (HOSPITAL_COMMUNITY)
Admission: EM | Admit: 2015-06-24 | Discharge: 2015-06-24 | Disposition: A | Discharge status: Home / Self Care | Payer: Self-pay | Attending: Emergency Medicine | Admitting: Emergency Medicine

## 2015-06-24 ENCOUNTER — Emergency Department (HOSPITAL_COMMUNITY): Payer: Self-pay

## 2015-06-24 ENCOUNTER — Encounter (HOSPITAL_COMMUNITY): Payer: Self-pay | Admitting: Emergency Medicine

## 2015-06-24 DIAGNOSIS — R112 Nausea with vomiting, unspecified: Secondary | ICD-10-CM | POA: Insufficient documentation

## 2015-06-24 DIAGNOSIS — R197 Diarrhea, unspecified: Secondary | ICD-10-CM | POA: Insufficient documentation

## 2015-06-24 DIAGNOSIS — Z8659 Personal history of other mental and behavioral disorders: Secondary | ICD-10-CM | POA: Insufficient documentation

## 2015-06-24 DIAGNOSIS — Z720 Tobacco use: Secondary | ICD-10-CM | POA: Insufficient documentation

## 2015-06-24 DIAGNOSIS — I10 Essential (primary) hypertension: Secondary | ICD-10-CM | POA: Insufficient documentation

## 2015-06-24 DIAGNOSIS — Z862 Personal history of diseases of the blood and blood-forming organs and certain disorders involving the immune mechanism: Secondary | ICD-10-CM | POA: Insufficient documentation

## 2015-06-24 DIAGNOSIS — Z79899 Other long term (current) drug therapy: Secondary | ICD-10-CM | POA: Insufficient documentation

## 2015-06-24 DIAGNOSIS — K219 Gastro-esophageal reflux disease without esophagitis: Secondary | ICD-10-CM | POA: Insufficient documentation

## 2015-06-24 DIAGNOSIS — R109 Unspecified abdominal pain: Secondary | ICD-10-CM | POA: Insufficient documentation

## 2015-06-24 LAB — URINALYSIS, ROUTINE W REFLEX MICROSCOPIC
Bilirubin Urine: NEGATIVE
Glucose, UA: NEGATIVE mg/dL
HGB URINE DIPSTICK: NEGATIVE
Ketones, ur: NEGATIVE mg/dL
Leukocytes, UA: NEGATIVE
NITRITE: NEGATIVE
Protein, ur: NEGATIVE mg/dL
SPECIFIC GRAVITY, URINE: 1.002 — AB (ref 1.005–1.030)
Urobilinogen, UA: 0.2 mg/dL (ref 0.0–1.0)
pH: 7 (ref 5.0–8.0)

## 2015-06-24 LAB — CBC
HEMATOCRIT: 38.1 % (ref 36.0–46.0)
Hemoglobin: 13.7 g/dL (ref 12.0–15.0)
MCH: 38.3 pg — ABNORMAL HIGH (ref 26.0–34.0)
MCHC: 36 g/dL (ref 30.0–36.0)
MCV: 106.4 fL — ABNORMAL HIGH (ref 78.0–100.0)
Platelets: 251 10*3/uL (ref 150–400)
RBC: 3.58 MIL/uL — ABNORMAL LOW (ref 3.87–5.11)
RDW: 18.4 % — AB (ref 11.5–15.5)
WBC: 6.6 10*3/uL (ref 4.0–10.5)

## 2015-06-24 LAB — C DIFFICILE QUICK SCREEN W PCR REFLEX
C DIFFICILE (CDIFF) INTERP: NEGATIVE
C Diff antigen: NEGATIVE
C Diff toxin: NEGATIVE

## 2015-06-24 LAB — COMPREHENSIVE METABOLIC PANEL
ALBUMIN: 3.9 g/dL (ref 3.5–5.0)
ALT: 80 U/L — AB (ref 14–54)
AST: 145 U/L — AB (ref 15–41)
Alkaline Phosphatase: 154 U/L — ABNORMAL HIGH (ref 38–126)
Anion gap: 15 (ref 5–15)
CHLORIDE: 97 mmol/L — AB (ref 101–111)
CO2: 23 mmol/L (ref 22–32)
Calcium: 9.7 mg/dL (ref 8.9–10.3)
Creatinine, Ser: 0.75 mg/dL (ref 0.44–1.00)
GFR calc Af Amer: >60 mL/min (ref >60)
GFR calc non Af Amer: >60 mL/min (ref >60)
GLUCOSE: 126 mg/dL — AB (ref 65–99)
Potassium: 3.1 mmol/L — ABNORMAL LOW (ref 3.5–5.1)
Sodium: 135 mmol/L (ref 135–145)
Total Bilirubin: 0.7 mg/dL (ref 0.3–1.2)
Total Protein: 7.3 g/dL (ref 6.5–8.1)

## 2015-06-24 LAB — LIPASE, BLOOD: LIPASE: 26 U/L (ref 22–51)

## 2015-06-24 LAB — POC OCCULT BLOOD, ED: FECAL OCCULT BLD: POSITIVE — AB

## 2015-06-24 MED ORDER — HYDROMORPHONE HCL 1 MG/ML IJ SOLN
1.0000 mg | Freq: Once | INTRAMUSCULAR | Status: AC
  Administered 2015-06-24: 1 mg via INTRAVENOUS
  Filled 2015-06-24: qty 1

## 2015-06-24 MED ORDER — POTASSIUM CHLORIDE CRYS ER 20 MEQ PO TBCR
40.0000 meq | EXTENDED_RELEASE_TABLET | Freq: Once | ORAL | Status: AC
  Administered 2015-06-24: 40 meq via ORAL
  Filled 2015-06-24: qty 2

## 2015-06-24 MED ORDER — HYDROMORPHONE HCL 1 MG/ML IJ SOLN
0.5000 mg | Freq: Once | INTRAMUSCULAR | Status: AC
  Administered 2015-06-24: 0.5 mg via INTRAVENOUS
  Filled 2015-06-24: qty 1

## 2015-06-24 MED ORDER — ONDANSETRON HCL 4 MG/2ML IJ SOLN
4.0000 mg | Freq: Once | INTRAMUSCULAR | Status: AC
  Administered 2015-06-24: 4 mg via INTRAVENOUS
  Filled 2015-06-24: qty 2

## 2015-06-24 MED ORDER — BARIUM SULFATE 2.1 % PO SUSP
ORAL | Status: AC
  Administered 2015-06-24: 1 mL
  Filled 2015-06-24: qty 2

## 2015-06-24 MED ORDER — SODIUM CHLORIDE 0.9 % IV BOLUS (SEPSIS)
2000.0000 mL | Freq: Once | INTRAVENOUS | Status: AC
Start: 2015-06-24 — End: 2015-06-24
  Administered 2015-06-24: 2000 mL via INTRAVENOUS

## 2015-06-24 MED ORDER — METOCLOPRAMIDE HCL 5 MG/ML IJ SOLN
5.0000 mg | Freq: Once | INTRAMUSCULAR | Status: AC
  Administered 2015-06-24: 5 mg via INTRAVENOUS
  Filled 2015-06-24: qty 2

## 2015-06-24 MED ORDER — METOCLOPRAMIDE HCL 10 MG PO TABS: 10.0000 mg | ORAL_TABLET | Freq: Four times a day (QID) | ORAL | Status: DC

## 2015-06-24 MED ORDER — SODIUM CHLORIDE 0.9 % IV BOLUS (SEPSIS)
2000.0000 mL | Freq: Once | INTRAVENOUS | Status: AC
  Administered 2015-06-24: 2000 mL via INTRAVENOUS

## 2015-06-24 NOTE — ED Notes (Signed)
Pt unable to keep contrast down, spoke with MD - will give zofran and if she is still unable to keep the contrast down then will just complete scan without contrast.

## 2015-06-24 NOTE — ED Notes (Signed)
Pt given apple juice - tolerating well.

## 2015-06-24 NOTE — ED Notes (Signed)
Pt transporting to xray.  

## 2015-06-24 NOTE — ED Provider Notes (Signed)
Care assumed from Dr. Winfred Leeds.  acute on chronic abdominal pain with diarrhea and vomiting. History of C. Difficile colitis. Workup reassuring today including CT scan. C. Difficile pending.  C. Difficile is negative. Patient able to drink without vomiting. Outpatient follow-up arranged through case manager per Dr. Winfred Leeds.  Ezequiel Essex, MD 06/25/15 [phone]
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
## 2015-06-24 NOTE — Discharge Instructions (Signed)

## 2015-06-24 NOTE — ED Notes (Signed)
Pt to ED with complaint of generalized abdominal pain, worse on the right upper and lower. Pt reports being unable to keep fluids/food down. States has had 20 episodes of diarrhea in the last 24 hours, reports it appears like mucous and is yellow/orange. Hx of C Diff. No fever at present time. VSS.

## 2015-06-24 NOTE — Care Management (Signed)
ED CM spoke with patient regarding f/u care, patient states, she is a patient at the [facility] was seeing Dr. Annitta Needs but has not been assigned another Provider as of yet. Patient needs GI follow up but does not have insurance, explained that the referral will have to be through her PCP. Appointment schedule with [facility] for Tuesday 9/13 at 9:45. Patient agreeable. Updated Dr. Winfred Leeds with plan. No further ED CM needs identified.

## 2015-07-03 ENCOUNTER — Encounter (HOSPITAL_BASED_OUTPATIENT_CLINIC_OR_DEPARTMENT_OTHER): Payer: Self-pay | Admitting: Emergency Medicine

## 2015-07-03 ENCOUNTER — Emergency Department (HOSPITAL_BASED_OUTPATIENT_CLINIC_OR_DEPARTMENT_OTHER)
Admission: EM | Admit: 2015-07-03 | Discharge: 2015-07-04 | Disposition: A | Discharge status: Home / Self Care | Payer: Self-pay | Attending: Emergency Medicine | Admitting: Emergency Medicine

## 2015-07-03 ENCOUNTER — Encounter: Payer: Self-pay | Admitting: Family Medicine

## 2015-07-03 ENCOUNTER — Ambulatory Visit: Payer: Self-pay | Attending: Family Medicine | Admitting: Family Medicine

## 2015-07-03 VITALS — BP 116/80 | HR 101 | Temp 98.2°F | Ht 61.0 in | Wt 145.6 lb

## 2015-07-03 DIAGNOSIS — Z862 Personal history of diseases of the blood and blood-forming organs and certain disorders involving the immune mechanism: Secondary | ICD-10-CM | POA: Insufficient documentation

## 2015-07-03 DIAGNOSIS — Z8659 Personal history of other mental and behavioral disorders: Secondary | ICD-10-CM | POA: Insufficient documentation

## 2015-07-03 DIAGNOSIS — K047 Periapical abscess without sinus: Secondary | ICD-10-CM

## 2015-07-03 DIAGNOSIS — I10 Essential (primary) hypertension: Secondary | ICD-10-CM | POA: Insufficient documentation

## 2015-07-03 DIAGNOSIS — Z79899 Other long term (current) drug therapy: Secondary | ICD-10-CM | POA: Insufficient documentation

## 2015-07-03 DIAGNOSIS — Z720 Tobacco use: Secondary | ICD-10-CM | POA: Insufficient documentation

## 2015-07-03 DIAGNOSIS — K219 Gastro-esophageal reflux disease without esophagitis: Secondary | ICD-10-CM | POA: Insufficient documentation

## 2015-07-03 DIAGNOSIS — R1011 Right upper quadrant pain: Secondary | ICD-10-CM

## 2015-07-03 DIAGNOSIS — R112 Nausea with vomiting, unspecified: Secondary | ICD-10-CM

## 2015-07-03 DIAGNOSIS — K029 Dental caries, unspecified: Secondary | ICD-10-CM | POA: Insufficient documentation

## 2015-07-03 MED ORDER — BUPIVACAINE-EPINEPHRINE (PF) 0.5% -1:200000 IJ SOLN
1.8000 mL | Freq: Once | INTRAMUSCULAR | Status: AC
  Administered 2015-07-03: 1.8 mL
  Filled 2015-07-03: qty 1.8

## 2015-07-03 MED ORDER — AMOXICILLIN 500 MG PO CAPS: 500.0000 mg | ORAL_CAPSULE | Freq: Three times a day (TID) | ORAL | Status: DC
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
## 2015-07-03 NOTE — ED Notes (Signed)
Pt prescribed amoxicillin for tooth ache today, but thought motrin would help pain. Pt states continues to have severe pain tonight and wants something to help with pain

## 2015-07-03 NOTE — Progress Notes (Signed)
Subjective:  Patient ID: Jamie Allen, female    DOB: Mar 09, 1971  Age: 44 y.o. MRN: [id number]  CC: No chief complaint on file.   HPI Kalina Valeria Boza presents for follow-up of right upper quadrant abdominal pain which she states she has had for over a year and this dates back to when she had a chronic infection with C. difficile.  She presented to the ED on 06/24/15 with right upper quadrant pain, nausea and vomiting and labs revealed a hypokalemia of 3.1 and elevated LFTs, she was positive for fecal occult blood, C. difficile was negative. CT abdomen and pelvis revealed no acute or inflammatory process, fatty lever, mild left renal atrophy. She received IV fluid, potassium replacement, was placed on antiemetics and subsequently discharged.  Right upper quadrant pain comes in waves she says and waxes and wanes with current episode which started today and is 8/10. She usually has vomiting and diarrhea and her burp is foul smelling. Diarrhea is usually yellow and 'snorty' and she sometimes sees undigested food in her feces.  Also complains of teeth rotting and has had a few extractions at the [facility] but had a nasty experience due to pain and is requesting an antibiotic for an abscess of her tooth..  Outpatient Prescriptions Prior to Visit  Medication Sig Dispense Refill  . dicyclomine (BENTYL) 20 MG tablet Take 1 tablet (20 mg total) by mouth 2 (two) times daily. 20 tablet 0  . diphenhydrAMINE (BENADRYL) 25 MG tablet Take 25 mg by mouth daily.    Marland Kitchen esomeprazole (NEXIUM) 40 MG capsule Take 1 capsule (40 mg total) by mouth daily. For acid reflux    . MELATONIN PO Take 1 tablet by mouth at bedtime.    . metoCLOPramide (REGLAN) 10 MG tablet Take 1 tablet (10 mg total) by mouth every 6 (six) hours. 30 tablet 0  . QUEtiapine (SEROQUEL) 50 MG tablet Take 6 tablets (300 mg total) by mouth at bedtime. For mood control (Patient not taking: Reported on 02/05/2015)    .  clotrimazole-betamethasone (LOTRISONE) cream Apply 1 application topically 2 (two) times daily. (Patient not taking: Reported on 02/27/2015) 30 g 0  . diphenhydrAMINE (BENADRYL) 25 MG tablet Take 1 tablet (25 mg total) by mouth every 6 (six) hours as needed for itching or allergies. (Patient not taking: Reported on 02/27/2015) 30 tablet 0  . loratadine (CLARITIN) 10 MG tablet Take 1 tablet (10 mg total) by mouth daily. For allergies (Patient not taking: Reported on 02/05/2015)    . nicotine (NICODERM CQ - DOSED IN MG/24 HOURS) 14 mg/24hr patch Place 1 patch (14 mg total) onto the skin daily. For nicotine addiction (Patient not taking: Reported on 02/05/2015) 28 patch 0  . polyethylene glycol (MIRALAX / GLYCOLAX) packet Take 17 g by mouth daily. For constipation (Patient not taking: Reported on 02/05/2015) 14 each 0  . promethazine (PHENERGAN) 25 MG tablet Take 1 tablet (25 mg total) by mouth every 6 (six) hours as needed for nausea or vomiting. 10 tablet 0  . ranitidine (ZANTAC) 150 MG tablet Take 1 tablet (150 mg total) by mouth at bedtime. (Patient not taking: Reported on 02/05/2015) 30 tablet 3  . simethicone (GAS-X) 80 MG chewable tablet Chew 1 tablet (80 mg total) by mouth daily as needed for flatulence. (Gas) (Patient not taking: Reported on 02/05/2015) 30 tablet 0   No facility-administered medications prior to visit.    ROS Review of Systems  Constitutional: Negative for activity change,  appetite change and fatigue.  HENT: Negative for congestion, sinus pressure and sore throat.   Eyes: Negative for visual disturbance.  Respiratory: Negative for cough, chest tightness, shortness of breath and wheezing.   Cardiovascular: Negative for chest pain and palpitations.  Gastrointestinal:       See hpi  Endocrine: Negative for polydipsia.  Genitourinary: Negative for dysuria and frequency.  Musculoskeletal: Negative for back pain and arthralgias.  Skin: Negative for rash.  Neurological: Negative for  tremors, light-headedness and numbness.  Hematological: Does not bruise/bleed easily.  Psychiatric/Behavioral: Negative for behavioral problems and agitation.    Objective: Filed Vitals:   07/03/15 1448  BP: 116/80  Pulse: 101  Temp: 98.2 F (36.8 C)  TempSrc: Oral  Height: 5\' 1"  (1.549 m)  Weight: 145 lb 9.6 oz (66.044 kg)  SpO2: 95%         BP/Weight 06/24/2015 02/27/2015 1/96/2229  Systolic BP 798 921 194  Diastolic BP 64 60 77  Wt. (Lbs) - 130 -  BMI - 24.58 -  Some encounter information is confidential and restricted. Go to Review Flowsheets activity to see all data.    Lab Results  Component Value Date   WBC 6.6 06/24/2015   HGB 13.7 06/24/2015   HCT 38.1 06/24/2015   PLT 251 06/24/2015   GLUCOSE 126* 06/24/2015   CHOL 179 11/17/2013   TRIG 377* 11/17/2013   HDL 66 11/17/2013   LDLCALC 38 11/17/2013   ALT 80* 06/24/2015   AST 145* 06/24/2015   NA 135 06/24/2015   K 3.1* 06/24/2015   CL 97* 06/24/2015   CREATININE 0.75 06/24/2015   BUN <5* 06/24/2015   CO2 23 06/24/2015   TSH 2.306 11/27/2014   INR 1.23 12/16/2013    Physical Exam  Constitutional: She is oriented to person, place, and time. She appears well-developed and well-nourished. No distress.  HENT:  Head: Normocephalic.  Right Ear: External ear normal.  Left Ear: External ear normal.  Nose: Nose normal.  Mouth/Throat: Oropharynx is clear and moist.  Eyes: Conjunctivae and EOM are normal. Pupils are equal, round, and reactive to light.  Neck: Normal range of motion. No JVD present.  Cardiovascular: Regular rhythm, normal heart sounds and intact distal pulses.  Tachycardia present.  Exam reveals no gallop.   No murmur heard. Pulmonary/Chest: Effort normal and breath sounds normal. No respiratory distress. She has no wheezes. She has no rales. She exhibits no tenderness.  Abdominal: Soft. Bowel sounds are normal. She exhibits no distension and no mass. There is tenderness (right upper quadrant  tenderness).  Musculoskeletal: Normal range of motion. She exhibits no edema or tenderness.  Neurological: She is alert and oriented to person, place, and time. She has normal reflexes.  Skin: Skin is warm and dry. She is not diaphoretic.  Psychiatric: She has a normal mood and affect.     Assessment & Plan:   1. Abdominal pain, right upper quadrant Unknown etiology. Workup unrevealing except for fatty liver Continue PPI. - Ambulatory referral to Gastroenterology  2. Non-intractable vomiting with nausea, vomiting of unspecified type She remains on metoclopramide which I have advised her to continue. - Ambulatory referral to Gastroenterology  3. Tooth abscess Patient unable to afford routine dental care and she [facility] and is not willing to return back there. Placed on amoxicillin.   Meds ordered this encounter  Medications  . amoxicillin (AMOXIL) 500 MG capsule    Sig: Take 1 capsule (500 mg  total) by mouth 3 (three) times daily.    Dispense:  30 capsule    Refill:  0    Follow-up: Return in about 1 month (around 08/02/2015) for PCP-to establish and follow up on abdominal pain.Arnoldo Morale MD

## 2015-07-03 NOTE — Patient Instructions (Signed)

## 2015-07-04 MED ORDER — HYDROCODONE-ACETAMINOPHEN 5-325 MG PO TABS: 1.0000 | ORAL_TABLET | Freq: Four times a day (QID) | ORAL | Status: DC | PRN

## 2015-07-04 MED ORDER — BUPIVACAINE-EPINEPHRINE (PF) 0.5% -1:200000 IJ SOLN
1.8000 mL | Freq: Once | INTRAMUSCULAR | Status: AC
  Administered 2015-07-04: 1.8 mL
  Filled 2015-07-04: qty 1.8

## 2015-07-04 NOTE — Discharge Instructions (Signed)

## 2015-07-09 ENCOUNTER — Ambulatory Visit: Payer: Self-pay | Attending: Internal Medicine

## 2015-08-17 ENCOUNTER — Emergency Department (HOSPITAL_COMMUNITY): Payer: Self-pay

## 2015-08-17 ENCOUNTER — Encounter (HOSPITAL_COMMUNITY): Payer: Self-pay | Admitting: *Deleted

## 2015-08-17 ENCOUNTER — Emergency Department (HOSPITAL_COMMUNITY)
Admission: EM | Admit: 2015-08-17 | Discharge: 2015-08-17 | Disposition: A | Discharge status: Home / Self Care | Payer: Self-pay | Attending: Emergency Medicine | Admitting: Emergency Medicine

## 2015-08-17 DIAGNOSIS — R000 Tachycardia, unspecified: Secondary | ICD-10-CM | POA: Insufficient documentation

## 2015-08-17 DIAGNOSIS — Z862 Personal history of diseases of the blood and blood-forming organs and certain disorders involving the immune mechanism: Secondary | ICD-10-CM | POA: Insufficient documentation

## 2015-08-17 DIAGNOSIS — Z720 Tobacco use: Secondary | ICD-10-CM | POA: Insufficient documentation

## 2015-08-17 DIAGNOSIS — Z792 Long term (current) use of antibiotics: Secondary | ICD-10-CM | POA: Insufficient documentation

## 2015-08-17 DIAGNOSIS — M549 Dorsalgia, unspecified: Secondary | ICD-10-CM

## 2015-08-17 DIAGNOSIS — R531 Weakness: Secondary | ICD-10-CM | POA: Insufficient documentation

## 2015-08-17 DIAGNOSIS — K219 Gastro-esophageal reflux disease without esophagitis: Secondary | ICD-10-CM | POA: Insufficient documentation

## 2015-08-17 DIAGNOSIS — K589 Irritable bowel syndrome without diarrhea: Secondary | ICD-10-CM | POA: Insufficient documentation

## 2015-08-17 DIAGNOSIS — M545 Low back pain: Secondary | ICD-10-CM | POA: Insufficient documentation

## 2015-08-17 DIAGNOSIS — F419 Anxiety disorder, unspecified: Secondary | ICD-10-CM | POA: Insufficient documentation

## 2015-08-17 DIAGNOSIS — Z79899 Other long term (current) drug therapy: Secondary | ICD-10-CM | POA: Insufficient documentation

## 2015-08-17 DIAGNOSIS — I10 Essential (primary) hypertension: Secondary | ICD-10-CM | POA: Insufficient documentation

## 2015-08-17 LAB — CBC WITH DIFFERENTIAL/PLATELET
BASOS ABS: 0 10*3/uL (ref 0.0–0.1)
BASOS PCT: 1 %
EOS ABS: 0.2 10*3/uL (ref 0.0–0.7)
Eosinophils Relative: 2 %
HEMATOCRIT: 35.6 % — AB (ref 36.0–46.0)
HEMOGLOBIN: 12.3 g/dL (ref 12.0–15.0)
Lymphocytes Relative: 28 %
Lymphs Abs: 2.1 10*3/uL (ref 0.7–4.0)
MCH: 36.6 pg — ABNORMAL HIGH (ref 26.0–34.0)
MCHC: 34.6 g/dL (ref 30.0–36.0)
MCV: 106 fL — ABNORMAL HIGH (ref 78.0–100.0)
MONOS PCT: 5 %
Monocytes Absolute: 0.4 10*3/uL (ref 0.1–1.0)
NEUTROS ABS: 4.8 10*3/uL (ref 1.7–7.7)
NEUTROS PCT: 64 %
Platelets: 367 10*3/uL (ref 150–400)
RBC: 3.36 MIL/uL — AB (ref 3.87–5.11)
RDW: 14.8 % (ref 11.5–15.5)
WBC: 7.5 10*3/uL (ref 4.0–10.5)

## 2015-08-17 LAB — BASIC METABOLIC PANEL
ANION GAP: 11 (ref 5–15)
BUN: <5 mg/dL — ABNORMAL LOW (ref 6–20)
CALCIUM: 9.7 mg/dL (ref 8.9–10.3)
CO2: 25 mmol/L (ref 22–32)
Chloride: 104 mmol/L (ref 101–111)
Creatinine, Ser: 0.78 mg/dL (ref 0.44–1.00)
GFR calc non Af Amer: >60 mL/min (ref >60)
GLUCOSE: 93 mg/dL (ref 65–99)
POTASSIUM: 3.9 mmol/L (ref 3.5–5.1)
Sodium: 140 mmol/L (ref 135–145)

## 2015-08-17 LAB — URINALYSIS, ROUTINE W REFLEX MICROSCOPIC
Bilirubin Urine: NEGATIVE
GLUCOSE, UA: NEGATIVE mg/dL
Hgb urine dipstick: NEGATIVE
Ketones, ur: NEGATIVE mg/dL
LEUKOCYTES UA: NEGATIVE
NITRITE: NEGATIVE
PROTEIN: NEGATIVE mg/dL
Specific Gravity, Urine: 1.006 (ref 1.005–1.030)
Urobilinogen, UA: 0.2 mg/dL (ref 0.0–1.0)
pH: 6 (ref 5.0–8.0)

## 2015-08-17 LAB — POC URINE PREG, ED: Preg Test, Ur: NEGATIVE

## 2015-08-17 MED ORDER — METHOCARBAMOL 500 MG PO TABS: 500.0000 mg | ORAL_TABLET | Freq: Three times a day (TID) | ORAL | Status: DC | PRN

## 2015-08-17 MED ORDER — HYDROMORPHONE HCL 1 MG/ML IJ SOLN
1.0000 mg | Freq: Once | INTRAMUSCULAR | Status: AC
  Administered 2015-08-17: 1 mg via INTRAMUSCULAR
  Filled 2015-08-17: qty 1

## 2015-08-17 MED ORDER — KETOROLAC TROMETHAMINE 60 MG/2ML IM SOLN
60.0000 mg | Freq: Once | INTRAMUSCULAR | Status: AC
  Administered 2015-08-17: 60 mg via INTRAMUSCULAR
  Filled 2015-08-17: qty 2

## 2015-08-17 NOTE — ED Notes (Signed)
Pt reports severe back pain x 3 days, now has tingling to legs and feet, difficulty bearing weight.
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
## 2015-08-17 NOTE — Discharge Instructions (Signed)
Follow up with your doctor for further evaluation of your back pain and leg numbness. Return to ER if you have any concerns.  Back Pain, Adult Back pain is very common in adults.The cause of back pain is rarely dangerous and the pain often gets better over time.The cause of your back pain may not be known. Some common causes of back pain include:  Strain of the muscles or ligaments supporting the spine.  Wear and tear (degeneration) of the spinal disks.  Arthritis.  Direct injury to the back. For many people, back pain may return. Since back pain is rarely dangerous, most people can learn to manage this condition on their own. HOME CARE INSTRUCTIONS Watch your back pain for any changes. The following actions may help to lessen any discomfort you are feeling:  Remain active. It is stressful on your back to sit or stand in one place for long periods of time. Do not sit, drive, or stand in one place for more than 30 minutes at a time. Take short walks on even surfaces as soon as you are able.Try to increase the length of time you walk each day.  Exercise regularly as directed by your health care provider. Exercise helps your back heal faster. It also helps avoid future injury by keeping your muscles strong and flexible.  Do not stay in bed.Resting more than 1-2 days can delay your recovery.  Pay attention to your body when you bend and lift. The most comfortable positions are those that put less stress on your recovering back. Always use proper lifting techniques, including:  Bending your knees.  Keeping the load close to your body.  Avoiding twisting.  Find a comfortable position to sleep. Use a firm mattress and lie on your side with your knees slightly bent. If you lie on your back, put a pillow under your knees.  Avoid feeling anxious or stressed.Stress increases muscle tension and can worsen back pain.It is important to recognize when you are anxious or stressed and learn ways  to manage it, such as with exercise.  Take medicines only as directed by your health care provider. Over-the-counter medicines to reduce pain and inflammation are often the most helpful.Your health care provider may prescribe muscle relaxant drugs.These medicines help dull your pain so you can more quickly return to your normal activities and healthy exercise.  Apply ice to the injured area:  Put ice in a plastic bag.  Place a towel between your skin and the bag.  Leave the ice on for 20 minutes, 2-3 times a day for the first 2-3 days. After that, ice and heat may be alternated to reduce pain and spasms.  Maintain a healthy weight. Excess weight puts extra stress on your back and makes it difficult to maintain good posture. SEEK MEDICAL CARE IF:  You have pain that is not relieved with rest or medicine.  You have increasing pain going down into the legs or buttocks.  You have pain that does not improve in one week.  You have night pain.  You lose weight.  You have a fever or chills. SEEK IMMEDIATE MEDICAL CARE IF:   You develop new bowel or bladder control problems.  You have unusual weakness or numbness in your arms or legs.  You develop nausea or vomiting.  You develop abdominal pain.  You feel faint.   This information is not intended to replace advice given to you by your health care provider. Make sure you discuss any questions  you have with your health care provider.   Document Released: 10/06/2005 Document Revised: 10/27/2014 Document Reviewed: 02/07/2014 Elsevier Interactive Patient Education [employer].

## 2015-08-27 ENCOUNTER — Encounter: Payer: Self-pay | Admitting: Family Medicine

## 2015-08-27 ENCOUNTER — Ambulatory Visit: Payer: Self-pay | Attending: Family Medicine | Admitting: Family Medicine

## 2015-08-27 VITALS — BP 143/88 | HR 123 | Temp 98.3°F | Resp 16 | Ht 61.0 in | Wt 133.0 lb

## 2015-08-27 DIAGNOSIS — G5793 Unspecified mononeuropathy of bilateral lower limbs: Secondary | ICD-10-CM | POA: Insufficient documentation

## 2015-08-27 DIAGNOSIS — K589 Irritable bowel syndrome without diarrhea: Secondary | ICD-10-CM | POA: Insufficient documentation

## 2015-08-27 DIAGNOSIS — G5791 Unspecified mononeuropathy of right lower limb: Secondary | ICD-10-CM | POA: Insufficient documentation

## 2015-08-27 DIAGNOSIS — E559 Vitamin D deficiency, unspecified: Secondary | ICD-10-CM | POA: Insufficient documentation

## 2015-08-27 DIAGNOSIS — G5792 Unspecified mononeuropathy of left lower limb: Secondary | ICD-10-CM | POA: Insufficient documentation

## 2015-08-27 LAB — POCT GLYCOSYLATED HEMOGLOBIN (HGB A1C): Hemoglobin A1C: 4.9

## 2015-08-27 LAB — VITAMIN B12: Vitamin B-12: 557 pg/mL (ref 211–911)

## 2015-08-27 LAB — TSH: TSH: 2.988 u[IU]/mL (ref 0.350–4.500)

## 2015-08-27 LAB — GLUCOSE, POCT (MANUAL RESULT ENTRY): POC Glucose: 124 mg/dl — AB (ref 70–99)

## 2015-08-27 MED ORDER — TRAMADOL HCL 50 MG PO TABS: 50.0000 mg | ORAL_TABLET | Freq: Three times a day (TID) | ORAL | Status: DC | PRN

## 2015-08-27 MED ORDER — GABAPENTIN 300 MG PO CAPS: 300.0000 mg | ORAL_CAPSULE | Freq: Every day | ORAL | Status: DC

## 2015-08-27 NOTE — Progress Notes (Signed)
Patient ID: Jamie Allen, female   DOB: [birth year], 44 y.o.   MRN: [id number]   Subjective:  Patient ID: Jamie Allen, female    DOB: Oct 04, 1971  Age: 44 y.o. MRN: [id number]  CC: Leg Pain   HPI Jamie Allen presents for    1. Back pain:  3 days. Went away. Went to stand up to walk and could not feel feet. Numbness and tingling and pain up to thighs. Mainly in calves. No recent falls, accidents. Trauma. Went to ED, lumabr MRI was normal. Pain keeps her up at night. Taking advil PM for pain.   Social History  Substance Use Topics  . Smoking status: Current Every Day Smoker -- 0.50 packs/day for 20 years    Types: Cigarettes  . Smokeless tobacco: Not on file  . Alcohol Use: No    Outpatient Prescriptions Prior to Visit  Medication Sig Dispense Refill  . esomeprazole (NEXIUM) 40 MG capsule Take 1 capsule (40 mg total) by mouth daily. For acid reflux    . Aspirin-Salicylamide-Caffeine (BC HEADACHE POWDER PO) Take 1 packet by mouth daily as needed (general pain / headache).    . dicyclomine (BENTYL) 10 MG capsule Take 10 mg by mouth 4 (four) times daily.  1  . diphenhydrAMINE (BENADRYL) 25 MG tablet Take 25 mg by mouth daily.    Marland Kitchen ibuprofen (ADVIL,MOTRIN) 200 MG tablet Take 800 mg by mouth every 6 (six) hours as needed.    Marland Kitchen MELATONIN PO Take 1 tablet by mouth at bedtime.    . methocarbamol (ROBAXIN) 500 MG tablet Take 1 tablet (500 mg total) by mouth every 8 (eight) hours as needed for muscle spasms. (Patient not taking: Reported on 08/27/2015) 20 tablet 0   No facility-administered medications prior to visit.    ROS Review of Systems  Constitutional: Negative for fever and chills.  Eyes: Negative for visual disturbance.  Respiratory: Negative for shortness of breath.   Cardiovascular: Negative for chest pain.  Gastrointestinal: Positive for nausea and abdominal pain. Negative for blood in stool.  Musculoskeletal: Positive for myalgias. Negative for back pain and  arthralgias.  Skin: Negative for rash.  Allergic/Immunologic: Negative for immunocompromised state.  Neurological: Positive for numbness.  Hematological: Negative for adenopathy. Does not bruise/bleed easily.  Psychiatric/Behavioral: Negative for suicidal ideas and dysphoric mood.    Objective:  BP 143/88 mmHg  Pulse 123  Temp(Src) 98.3 F (36.8 C) (Oral)  Resp 16  Ht 5\' 1"  (1.549 m)  Wt 133 lb (60.328 kg)  BMI 25.14 kg/m2  SpO2 97%  BP/Weight 08/27/2015 08/17/2015 03/17/4131  Systolic BP 440 102 725  Diastolic BP 88 92 90  Wt. (Lbs) 133 - -  BMI 25.14 - -  Some encounter information is confidential and restricted. Go to Review Flowsheets activity to see all data.   Physical Exam  Constitutional: She is oriented to person, place, and time. She appears well-developed and well-nourished. No distress.  HENT:  Head: Normocephalic and atraumatic.  Cardiovascular: Normal rate, regular rhythm, normal heart sounds and intact distal pulses.   Pulses:      Dorsalis pedis pulses are 2+ on the right side, and 2+ on the left side.       Posterior tibial pulses are 2+ on the right side, and 2+ on the left side.  Pulmonary/Chest: Effort normal and breath sounds normal.  Musculoskeletal: She exhibits no edema.  Neurological: She is alert and oriented to person, place, and time. She has normal strength. Gait  normal. She displays no Babinski's sign on the right side. She displays no Babinski's sign on the left side.  Reflex Scores:      Patellar reflexes are 2+ on the right side and 2+ on the left side.      Achilles reflexes are 1+ on the right side and 1+ on the left side. Slow, cautious gait   Skin: Skin is warm and dry. No rash noted.  Pale   Psychiatric: She has a normal mood and affect.    CBG 124 Lab Results  Component Value Date   HGBA1C 4.90 08/27/2015     Assessment & Plan:   Problem List Items Addressed This Visit    Neuropathic pain, leg, bilateral - Primary    Relevant Orders   POCT glycosylated hemoglobin (Hb A1C) (Completed)   POCT glucose (manual entry) (Completed)    Other Visit Diagnoses    Arthralgia of both lower legs           No orders of the defined types were placed in this encounter.    Follow-up: No Follow-up on file.   Boykin Nearing MD

## 2015-08-27 NOTE — Patient Instructions (Signed)
Jamie Allen was seen today for leg pain.  Diagnoses and all orders for this visit:  Neuropathic pain, leg, bilateral -     POCT glycosylated hemoglobin (Hb A1C) -     POCT glucose (manual entry) -     Vitamin B12 -     TSH -     gabapentin (NEURONTIN) 300 MG capsule; Take 1 capsule (300 mg total) by mouth at bedtime. -     traMADol (ULTRAM) 50 MG tablet; Take 1 tablet (50 mg total) by mouth every 8 (eight) hours as needed. -     Vitamin D, 25-hydroxy    Working up source of pain and numbness Exam is normal so far MRI done in ED of low back was normal Normal A1c and blood sugar Wear hard sole shoes when walking Walk with assistance if needed   F/u in 3 weeks  Dr. Adrian Blackwater

## 2015-08-27 NOTE — Progress Notes (Signed)
Establish Care  C/C pain on feet and leg X 3 weeks Pain worsen at night time, unable to walk due to pain Pain scale #9 Tobacco user- 6 cigarette per day

## 2015-08-28 DIAGNOSIS — E559 Vitamin D deficiency, unspecified: Secondary | ICD-10-CM | POA: Insufficient documentation

## 2015-08-28 LAB — VITAMIN D 25 HYDROXY (VIT D DEFICIENCY, FRACTURES): VIT D 25 HYDROXY: 8 ng/mL — AB (ref 30–100)

## 2015-08-28 MED ORDER — VITAMIN D (ERGOCALCIFEROL) 1.25 MG (50000 UNIT) PO CAPS: 50000.0000 [IU] | ORAL_CAPSULE | ORAL | Status: DC

## 2015-08-28 NOTE — Addendum Note (Signed)
Addended by: Boykin Nearing on: 08/28/2015 09:01 AM   Modules accepted: Orders

## 2015-09-09 ENCOUNTER — Observation Stay (HOSPITAL_COMMUNITY)
Admission: EM | Admit: 2015-09-09 | Discharge: 2015-09-11 | Disposition: A | Discharge status: Home / Self Care | Payer: Self-pay | Attending: Family Medicine | Admitting: Family Medicine

## 2015-09-09 ENCOUNTER — Encounter (HOSPITAL_COMMUNITY): Payer: Self-pay | Admitting: Emergency Medicine

## 2015-09-09 DIAGNOSIS — G5793 Unspecified mononeuropathy of bilateral lower limbs: Secondary | ICD-10-CM

## 2015-09-09 DIAGNOSIS — F419 Anxiety disorder, unspecified: Secondary | ICD-10-CM | POA: Insufficient documentation

## 2015-09-09 DIAGNOSIS — R112 Nausea with vomiting, unspecified: Principal | ICD-10-CM | POA: Diagnosis present

## 2015-09-09 DIAGNOSIS — Z885 Allergy status to narcotic agent status: Secondary | ICD-10-CM | POA: Insufficient documentation

## 2015-09-09 DIAGNOSIS — F4310 Post-traumatic stress disorder, unspecified: Secondary | ICD-10-CM | POA: Insufficient documentation

## 2015-09-09 DIAGNOSIS — F329 Major depressive disorder, single episode, unspecified: Secondary | ICD-10-CM | POA: Insufficient documentation

## 2015-09-09 DIAGNOSIS — E538 Deficiency of other specified B group vitamins: Secondary | ICD-10-CM | POA: Insufficient documentation

## 2015-09-09 DIAGNOSIS — K219 Gastro-esophageal reflux disease without esophagitis: Secondary | ICD-10-CM | POA: Insufficient documentation

## 2015-09-09 DIAGNOSIS — E559 Vitamin D deficiency, unspecified: Secondary | ICD-10-CM | POA: Insufficient documentation

## 2015-09-09 DIAGNOSIS — I10 Essential (primary) hypertension: Secondary | ICD-10-CM | POA: Insufficient documentation

## 2015-09-09 DIAGNOSIS — Z888 Allergy status to other drugs, medicaments and biological substances status: Secondary | ICD-10-CM | POA: Insufficient documentation

## 2015-09-09 DIAGNOSIS — E876 Hypokalemia: Secondary | ICD-10-CM | POA: Insufficient documentation

## 2015-09-09 DIAGNOSIS — G629 Polyneuropathy, unspecified: Secondary | ICD-10-CM | POA: Insufficient documentation

## 2015-09-09 DIAGNOSIS — R1110 Vomiting, unspecified: Secondary | ICD-10-CM

## 2015-09-09 DIAGNOSIS — K589 Irritable bowel syndrome without diarrhea: Secondary | ICD-10-CM

## 2015-09-09 DIAGNOSIS — R1115 Cyclical vomiting syndrome unrelated to migraine: Secondary | ICD-10-CM | POA: Diagnosis present

## 2015-09-09 DIAGNOSIS — K7581 Nonalcoholic steatohepatitis (NASH): Secondary | ICD-10-CM | POA: Insufficient documentation

## 2015-09-09 DIAGNOSIS — R109 Unspecified abdominal pain: Secondary | ICD-10-CM | POA: Insufficient documentation

## 2015-09-09 DIAGNOSIS — N390 Urinary tract infection, site not specified: Secondary | ICD-10-CM

## 2015-09-09 DIAGNOSIS — K140 Glossitis: Secondary | ICD-10-CM | POA: Insufficient documentation

## 2015-09-09 DIAGNOSIS — K580 Irritable bowel syndrome with diarrhea: Secondary | ICD-10-CM | POA: Insufficient documentation

## 2015-09-09 DIAGNOSIS — F17210 Nicotine dependence, cigarettes, uncomplicated: Secondary | ICD-10-CM | POA: Insufficient documentation

## 2015-09-09 LAB — I-STAT CHEM 8, ED
BUN: 3 mg/dL — AB (ref 6–20)
CREATININE: 0.9 mg/dL (ref 0.44–1.00)
Calcium, Ion: 0.94 mmol/L — ABNORMAL LOW (ref 1.12–1.23)
Chloride: 91 mmol/L — ABNORMAL LOW (ref 101–111)
Glucose, Bld: 119 mg/dL — ABNORMAL HIGH (ref 65–99)
HEMATOCRIT: 44 % (ref 36.0–46.0)
Hemoglobin: 15 g/dL (ref 12.0–15.0)
Potassium: 3.1 mmol/L — ABNORMAL LOW (ref 3.5–5.1)
SODIUM: 134 mmol/L — AB (ref 135–145)
TCO2: 29 mmol/L (ref 0–100)

## 2015-09-09 LAB — COMPREHENSIVE METABOLIC PANEL
ALBUMIN: 3.5 g/dL (ref 3.5–5.0)
ALT: 22 U/L (ref 14–54)
AST: 37 U/L (ref 15–41)
Alkaline Phosphatase: 130 U/L — ABNORMAL HIGH (ref 38–126)
Anion gap: 15 (ref 5–15)
CHLORIDE: 88 mmol/L — AB (ref 101–111)
CO2: 28 mmol/L (ref 22–32)
CREATININE: 1.16 mg/dL — AB (ref 0.44–1.00)
Calcium: 9.4 mg/dL (ref 8.9–10.3)
GFR calc Af Amer: >60 mL/min (ref >60)
GFR calc non Af Amer: 56 mL/min — ABNORMAL LOW (ref >60)
GLUCOSE: 146 mg/dL — AB (ref 65–99)
Potassium: 2.5 mmol/L — CL (ref 3.5–5.1)
SODIUM: 131 mmol/L — AB (ref 135–145)
Total Bilirubin: 1.1 mg/dL (ref 0.3–1.2)
Total Protein: 8 g/dL (ref 6.5–8.1)

## 2015-09-09 LAB — CBC
HCT: 44.2 % (ref 36.0–46.0)
Hemoglobin: 16 g/dL — ABNORMAL HIGH (ref 12.0–15.0)
MCH: 37.6 pg — AB (ref 26.0–34.0)
MCHC: 36.2 g/dL — ABNORMAL HIGH (ref 30.0–36.0)
MCV: 104 fL — AB (ref 78.0–100.0)
PLATELETS: 305 10*3/uL (ref 150–400)
RBC: 4.25 MIL/uL (ref 3.87–5.11)
RDW: 16 % — AB (ref 11.5–15.5)
WBC: 11.6 10*3/uL — ABNORMAL HIGH (ref 4.0–10.5)

## 2015-09-09 LAB — URINALYSIS, ROUTINE W REFLEX MICROSCOPIC
Bilirubin Urine: NEGATIVE
GLUCOSE, UA: NEGATIVE mg/dL
Ketones, ur: NEGATIVE mg/dL
Nitrite: POSITIVE — AB
PROTEIN: NEGATIVE mg/dL
Specific Gravity, Urine: 1.01 (ref 1.005–1.030)
pH: 8.5 — ABNORMAL HIGH (ref 5.0–8.0)

## 2015-09-09 LAB — URINE MICROSCOPIC-ADD ON

## 2015-09-09 LAB — LIPASE, BLOOD: LIPASE: 33 U/L (ref 11–51)

## 2015-09-09 LAB — POC OCCULT BLOOD, ED: Fecal Occult Bld: NEGATIVE

## 2015-09-09 MED ORDER — DICYCLOMINE HCL 10 MG PO CAPS
10.0000 mg | ORAL_CAPSULE | Freq: Four times a day (QID) | ORAL | Status: DC
  Administered 2015-09-09 – 2015-09-11 (×9): 10 mg via ORAL
  Filled 2015-09-09 (×9): qty 1

## 2015-09-09 MED ORDER — ACETAMINOPHEN 650 MG RE SUPP
650.0000 mg | Freq: Four times a day (QID) | RECTAL | Status: DC | PRN
Start: 2015-09-09 — End: 2015-09-11

## 2015-09-09 MED ORDER — GABAPENTIN 300 MG PO CAPS
300.0000 mg | ORAL_CAPSULE | Freq: Every day | ORAL | Status: DC
  Administered 2015-09-09 – 2015-09-10 (×2): 300 mg via ORAL
  Filled 2015-09-09 (×2): qty 1

## 2015-09-09 MED ORDER — POTASSIUM CHLORIDE 10 MEQ/100ML IV SOLN
10.0000 meq | Freq: Once | INTRAVENOUS | Status: AC
  Administered 2015-09-09: 10 meq via INTRAVENOUS
  Filled 2015-09-09: qty 100

## 2015-09-09 MED ORDER — ONDANSETRON 4 MG PO TBDP
4.0000 mg | ORAL_TABLET | Freq: Once | ORAL | Status: AC
  Administered 2015-09-09: 4 mg via ORAL

## 2015-09-09 MED ORDER — FENTANYL CITRATE (PF) 100 MCG/2ML IJ SOLN
100.0000 ug | Freq: Once | INTRAMUSCULAR | Status: AC
  Administered 2015-09-09: 100 ug via INTRAVENOUS
  Filled 2015-09-09: qty 2

## 2015-09-09 MED ORDER — HYDROCODONE-ACETAMINOPHEN 5-325 MG PO TABS
1.0000 | ORAL_TABLET | ORAL | Status: DC | PRN
Start: 2015-09-09 — End: 2015-09-11
  Administered 2015-09-09 – 2015-09-11 (×10): 1 via ORAL
  Filled 2015-09-09 (×10): qty 1

## 2015-09-09 MED ORDER — ACETAMINOPHEN 325 MG PO TABS
650.0000 mg | ORAL_TABLET | Freq: Four times a day (QID) | ORAL | Status: DC | PRN
  Filled 2015-09-09: qty 2

## 2015-09-09 MED ORDER — HYDROMORPHONE HCL 1 MG/ML IJ SOLN
1.0000 mg | Freq: Once | INTRAMUSCULAR | Status: AC
  Administered 2015-09-09: 1 mg via INTRAVENOUS
  Filled 2015-09-09: qty 1

## 2015-09-09 MED ORDER — ONDANSETRON 4 MG PO TBDP
ORAL_TABLET | ORAL | Status: AC
  Filled 2015-09-09: qty 1

## 2015-09-09 MED ORDER — DEXTROSE 5 % IV SOLN
1.0000 g | INTRAVENOUS | Status: DC
  Filled 2015-09-09: qty 10

## 2015-09-09 MED ORDER — ONDANSETRON HCL 4 MG PO TABS: 4.0000 mg | ORAL_TABLET | Freq: Four times a day (QID) | ORAL | Status: DC | PRN

## 2015-09-09 MED ORDER — ONDANSETRON HCL 4 MG/2ML IJ SOLN
4.0000 mg | Freq: Four times a day (QID) | INTRAMUSCULAR | Status: DC | PRN
Start: 2015-09-09 — End: 2015-09-11
  Administered 2015-09-09: 4 mg via INTRAVENOUS
  Filled 2015-09-09: qty 2

## 2015-09-09 MED ORDER — ONDANSETRON HCL 4 MG/2ML IJ SOLN: 4.0000 mg | Freq: Three times a day (TID) | INTRAMUSCULAR | Status: DC | PRN

## 2015-09-09 MED ORDER — POTASSIUM CHLORIDE IN NACL 20-0.9 MEQ/L-% IV SOLN
Freq: Once | INTRAVENOUS | Status: AC
  Administered 2015-09-09: 125 mL/h via INTRAVENOUS
  Filled 2015-09-09: qty 1000

## 2015-09-09 MED ORDER — POTASSIUM CHLORIDE CRYS ER 20 MEQ PO TBCR
40.0000 meq | EXTENDED_RELEASE_TABLET | Freq: Once | ORAL | Status: AC
  Administered 2015-09-09: 40 meq via ORAL
  Filled 2015-09-09: qty 2

## 2015-09-09 MED ORDER — HEPARIN SODIUM (PORCINE) 5000 UNIT/ML IJ SOLN
5000.0000 [IU] | Freq: Three times a day (TID) | INTRAMUSCULAR | Status: DC
  Administered 2015-09-09 – 2015-09-11 (×6): 5000 [IU] via SUBCUTANEOUS
  Filled 2015-09-09 (×5): qty 1

## 2015-09-09 MED ORDER — SODIUM CHLORIDE 0.9 % IV SOLN
INTRAVENOUS | Status: AC
  Administered 2015-09-09: 19:00:00 via INTRAVENOUS

## 2015-09-09 MED ORDER — PANTOPRAZOLE SODIUM 40 MG PO TBEC
40.0000 mg | DELAYED_RELEASE_TABLET | Freq: Every day | ORAL | Status: DC
  Administered 2015-09-10 – 2015-09-11 (×2): 40 mg via ORAL
  Filled 2015-09-09 (×2): qty 1

## 2015-09-09 NOTE — ED Notes (Signed)
Pt. Stated, I've had IBS and I've been sick for 2 weeks with abdominal pain, N/V. Unable to eat for 2 weeks.

## 2015-09-09 NOTE — Progress Notes (Signed)
FPTS Interim Progress Note  S: Evaluated patient for abdominal pain. States this is mainly in her right side mainly in RLQ. States she still has appendix. States she has been having this pain since she was in the ED. Requesting other pain medication as Norco is not helping .   O: BP 145/86 mmHg  Pulse 96  Temp(Src) 97.7 F (36.5 C) (Oral)  Resp 18  Ht 5\' 1"  (1.549 m)  Wt 124 lb 9 oz (56.5 kg)  BMI 23.55 kg/m2  SpO2 99%  Abdomen: soft, no rebound tenderness, no guarding.   A/P: Exam is not concerning for acute abdomen.  - will order Dilaudid 1mg  x 1 for pain control - [name] of abdomen (complete) ordered  Smiley Houseman, MD 09/09/2015, 10:19 PM PGY-1, [place] [facility] Service pager [phone]

## 2015-09-09 NOTE — ED Notes (Signed)
Report given to [place], Therapist, sports on [facility]
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
## 2015-09-09 NOTE — ED Notes (Signed)
Pt. Is N/V at triage.

## 2015-09-10 ENCOUNTER — Observation Stay (HOSPITAL_COMMUNITY): Payer: Self-pay

## 2015-09-10 DIAGNOSIS — G5791 Unspecified mononeuropathy of right lower limb: Secondary | ICD-10-CM

## 2015-09-10 DIAGNOSIS — R1110 Vomiting, unspecified: Secondary | ICD-10-CM

## 2015-09-10 DIAGNOSIS — K589 Irritable bowel syndrome without diarrhea: Secondary | ICD-10-CM

## 2015-09-10 DIAGNOSIS — E876 Hypokalemia: Secondary | ICD-10-CM

## 2015-09-10 DIAGNOSIS — R109 Unspecified abdominal pain: Secondary | ICD-10-CM

## 2015-09-10 DIAGNOSIS — G5792 Unspecified mononeuropathy of left lower limb: Secondary | ICD-10-CM

## 2015-09-10 LAB — CBC
HEMATOCRIT: 38.3 % (ref 36.0–46.0)
HEMOGLOBIN: 13.5 g/dL (ref 12.0–15.0)
MCH: 36.9 pg — ABNORMAL HIGH (ref 26.0–34.0)
MCHC: 35.2 g/dL (ref 30.0–36.0)
MCV: 104.6 fL — ABNORMAL HIGH (ref 78.0–100.0)
Platelets: 206 10*3/uL (ref 150–400)
RBC: 3.66 MIL/uL — AB (ref 3.87–5.11)
RDW: 15.8 % — ABNORMAL HIGH (ref 11.5–15.5)
WBC: 6.8 10*3/uL (ref 4.0–10.5)

## 2015-09-10 LAB — BASIC METABOLIC PANEL
ANION GAP: 12 (ref 5–15)
BUN: <5 mg/dL — ABNORMAL LOW (ref 6–20)
CALCIUM: 8.4 mg/dL — AB (ref 8.9–10.3)
CO2: 27 mmol/L (ref 22–32)
Chloride: 96 mmol/L — ABNORMAL LOW (ref 101–111)
Creatinine, Ser: 1.02 mg/dL — ABNORMAL HIGH (ref 0.44–1.00)
GLUCOSE: 96 mg/dL (ref 65–99)
POTASSIUM: 3.2 mmol/L — AB (ref 3.5–5.1)
Sodium: 135 mmol/L (ref 135–145)

## 2015-09-10 MED ORDER — BOOST / RESOURCE BREEZE PO LIQD
1.0000 | Freq: Two times a day (BID) | ORAL | Status: DC
  Administered 2015-09-10 – 2015-09-11 (×3): 1 via ORAL

## 2015-09-10 MED ORDER — POTASSIUM CHLORIDE CRYS ER 20 MEQ PO TBCR
40.0000 meq | EXTENDED_RELEASE_TABLET | Freq: Every day | ORAL | Status: DC
  Administered 2015-09-10 – 2015-09-11 (×2): 40 meq via ORAL
  Filled 2015-09-10 (×3): qty 2

## 2015-09-10 MED ORDER — CEPHALEXIN 500 MG PO CAPS: 500.0000 mg | ORAL_CAPSULE | Freq: Four times a day (QID) | ORAL | Status: DC

## 2015-09-10 MED ORDER — GABAPENTIN 100 MG PO CAPS
100.0000 mg | ORAL_CAPSULE | Freq: Two times a day (BID) | ORAL | Status: DC
  Administered 2015-09-10 (×2): 100 mg via ORAL
  Filled 2015-09-10 (×3): qty 1

## 2015-09-10 MED ORDER — DEXTROSE 5 % IV SOLN
1.0000 g | INTRAVENOUS | Status: DC
  Administered 2015-09-10: 1 g via INTRAVENOUS
  Filled 2015-09-10: qty 10

## 2015-09-10 NOTE — Evaluation (Signed)
Physical Therapy Evaluation [name] MRN: [id number] DOB: 05-27-1971 Today's Date: 09/10/2015   History of Present Illness  Pt adm with N/V and diarrhea, Pt also with recent onset of peripheral neuropathy.   Clinical Impression  Pt admitted with above diagnosis and presents to PT with functional limitations due to deficits listed below (See PT problem list). Pt needs skilled PT to maximize independence and safety to allow discharge to home. Pt needs a rolling walker for home.     Follow Up Recommendations No PT follow up (Pt declined HHPT)    Equipment Recommendations  Rolling walker with 5" wheels    Recommendations for Other Services       Precautions / Restrictions Precautions Precautions: Fall      Mobility  Bed Mobility Overal bed mobility: Independent                Transfers Overall transfer level: Modified independent                  Ambulation/Gait Ambulation/Gait assistance: Supervision;Min assist Ambulation Distance (Feet): 120 Feet Assistive device: Rolling walker (2 wheeled) Gait Pattern/deviations: Antalgic Gait velocity: decr Gait velocity interpretation: Below normal speed for age/gender General Gait Details: Unsteady with loss of balance when turning to bed without walker. With walker pt much steadier  Stairs            Wheelchair Mobility    Modified Rankin (Stroke Patients Only)       Balance Overall balance assessment: Needs assistance;History of Falls Sitting-balance support: No upper extremity supported;Feet supported Sitting balance-Leahy Scale: Normal     Standing balance support: No upper extremity supported Standing balance-Leahy Scale: Fair Standing balance comment: loss of balance with movement without walker.                             Pertinent Vitals/Pain Pain Assessment: 0-10 Pain Score: 8  Pain Location: bil feet Pain Descriptors / Indicators:  Burning Pain Intervention(s): Limited activity within patient's tolerance;Monitored during session    [place] expects to be discharged to:: Private residence Living Arrangements: Alone Available Help at Discharge: Friend(s);Available PRN/intermittently Type of Home: Apartment Home Access: Level entry     Home Layout: Two level Home Equipment: None      Prior Function Level of Independence: Independent         Comments: recent falls with new onset neuropathy     Hand Dominance        Extremity/Trunk Assessment   Upper Extremity Assessment: Overall WFL for tasks assessed           Lower Extremity Assessment: RLE deficits/detail;LLE deficits/detail RLE Deficits / Details: strength WFL LLE Deficits / Details: strength WFL     Communication   Communication: No difficulties  Cognition Arousal/Alertness: Awake/alert Behavior During Therapy: Impulsive Overall Cognitive Status: Within Functional Limits for tasks assessed                      General Comments      Exercises        Assessment/Plan    PT Assessment Patient needs continued PT services  PT Diagnosis Abnormality of gait;Acute pain;Difficulty walking   PT Problem List Decreased balance;Decreased mobility;Decreased activity tolerance;Pain  PT Treatment Interventions DME instruction;Gait training;Balance training;Patient/family education;Therapeutic activities;Functional mobility training   PT Goals (Current goals can be found in the Care Plan section) Acute Rehab PT Goals Patient  Stated Goal: decr pain in feet PT Goal Formulation: With patient Time For Goal Achievement: 09/17/15 Potential to Achieve Goals: Good    Frequency Min 3X/week   Barriers to discharge        Co-evaluation               End of Session Equipment Utilized During Treatment: Gait belt Activity Tolerance: Patient limited by pain Patient left: in bed;with call bell/phone within  reach;with bed alarm set Nurse Communication: Mobility status    Functional Assessment Tool Used: clinical judgement Functional Limitation: Mobility: Walking and moving around Mobility: Walking and Moving Around Current Status [id number]): At least 1 percent but less than 20 percent impaired, limited or restricted Mobility: Walking and Moving Around Goal Status [phone]): 0 percent impaired, limited or restricted    Time: 1223-1234 PT Time Calculation (min) (ACUTE ONLY): 11 min   Charges:   PT Evaluation $Initial PT Evaluation Tier I: 1 Procedure     PT G Codes:   PT G-Codes **NOT FOR INPATIENT CLASS** Functional Assessment Tool Used: clinical judgement Functional Limitation: Mobility: Walking and moving around Mobility: Walking and Moving Around Current Status [id number]): At least 1 percent but less than 20 percent impaired, limited or restricted Mobility: Walking and Moving Around Goal Status [phone]): 0 percent impaired, limited or restricted    [facility] 09/10/2015, 1:06 PM [facility] PT [phone]

## 2015-09-10 NOTE — Progress Notes (Signed)
Initial Nutrition Assessment  DOCUMENTATION CODES:   Not applicable  INTERVENTION:   -Boost Breeze po BID, each supplement provides 250 kcal and 9 grams of protein  NUTRITION DIAGNOSIS:   Inadequate oral intake related to nausea as evidenced by per patient/family report.  GOAL:   Patient will meet greater than or equal to 90% of their needs  MONITOR:   PO intake, Supplement acceptance, Labs, Weight trends, Skin, I & O's  REASON FOR ASSESSMENT:   Malnutrition Screening Tool    ASSESSMENT:   Jamie Allen is a 44 y.o. female presenting with 2 week history of nausea and vomiting with limited PO intake. PMH is significant for HTN, diarrhea-predominant IBS, NASH, GERD, colitis, PTSD, gallbladder disease and history of C diff requiring inpatient treatment for 2 months, per patient.   Pt admitted with intractable nausea, vomiting, and diarrhea.   Hx obtained from pt at bedside. She reports she is not a "big eater" at baseline and usually consumes 1-2 meals per day. She reveals that over the past 1-2 weeks, her intake has been minimal, due to nausea and vomiting. Since this time, intake has been comprised mainly of liquids (mostly water). She reports frequent nausea and vomiting from PO intake. Per her report, she has only drank water today, but has made a goal for herself to try other types of liquids today.   She admits to weight fluctuations at baseline, however, reveals UBW of 120#, which is consistent with wt hx.   Nutrition-Focused physical exam completed. Findings are no fat depletion, mild muscle depletion, and no edema. Muscle depletion is evident in the lower extremities. Per pt, she has experienced "tingling" in her legs and has been very inactive over the past few weeks as a result of this. Suspect muscle depletion is related to inactivity.   Discussed importance of good PO intake to promote healing. RD will order Boost Breeze to help optimize nutritional status.    Labs reviewed: K: 3.2 (on supplement).   Diet Order:  Diet Heart Room service appropriate?: Yes; Fluid consistency:: Thin  Skin:  Reviewed, no issues  Last BM:  PTA  Height:   Ht Readings from Last 1 Encounters:  09/09/15 5\' 1"  (1.549 m)    Weight:   Wt Readings from Last 1 Encounters:  09/09/15 124 lb 9 oz (56.5 kg)    Ideal Body Weight:  47.7 kg  BMI:  Body mass index is 23.55 kg/(m^2).  Estimated Nutritional Needs:   Kcal:  1400-1600  Protein:  65-80 grams  Fluid:  1.4-1.6 L  EDUCATION NEEDS:   Education needs addressed  [name], RD, LDN, CDE Pager: [phone] After hours Pager: [phone]

## 2015-09-10 NOTE — Progress Notes (Signed)
Family Medicine Teaching Service Daily Progress Note Intern Pager: [phone]  Patient name: Jamie Allen Medical record number: [id number] Date of birth: February 06, 1971 Age: 44 y.o. Gender: female  Primary Care Provider: Minerva Ends, MD Consultants: None Code Status: DNR  Pt Overview and Major Events to Date:  11/21: Admitted to [facility] with intractable nausea and vomiting.  Assessment and Plan: Jamie Allen is a 44 y.o. female presenting with 2 week history of nausea and vomiting with limited PO intake. PMH is significant for HTN, diarrhea-predominant IBS, NASH, GERD, colitis, PTSD, gallbladder disease and history of C diff requiring inpatient treatment for 2 months, per patient.   Abdominal pain, improved: Acutely worsened. Multiple etiologies possible including infectious (UTI), inflammatory (IBS), GERD, less likely appendicitis. No signs of acute abdomen this morning. Abdomen is soft and non-distended with mild tenderness to palpation of LUQ, LLQ, and suprapubic area. She had abdominal pain for >1 yr. Chronic in nature from IBS. Previous work-up unremarkable. Labs in the ED showed lipase 33, FOBT neg, WBC 11.6. Abdominal u/s (11/20) showed no acute abnormalities. She has been afebrile with HRs ranging from 89-113, BPs 122-150/76-114.  - Tylenol and norco prn ordered for pain. Received norco x 3 in the last 24 hours. - Continue home bentyl - Protonix 61m daily  Diarrhea, improved: Patient states she has had some recent loose stools. Recently diagnosed with IBS-diarrhea predominate. Chronic vs. acute on chronic. FOBT negative. Has not had any episodes of diarrhea since admission. - Evaluate for infectious etiologies - C. diff toxin, GI pathogen panel, stool culture have not been collected. - Continue home Bentyl  Nasuea/Vomiting, improved: Hx of atrophic gastritis. Could have worsening GERD. Lipase wnl. Obstruction unlikely given frequent diarrhea. Urine pregnancy test  negative 08/17/15. Has not had any vomiting since admission. - Control symptoms to enable PO   - IV zofran prn. She has received Zofran x 1 in the last 24 hours. - We have been hydrating her with NS + KCl at 125 ml/hr. Pt has been taking good PO since admission, so will d/c fluids today.   UTI: UA with trace hgb, pos nitrites, large leuks, few bacteria. Could be an underlying cause of nausea/vomiting/abdominal pain. Pt unsure if having symptoms because never had before. Afebrile. - IV rocephin given due to unable to tolerate PO - Anticipate transitioning to PO abx 09/10/15 - Urine culture pending.  Hypokalemia: In ED K was 2.5 > 3.2 this am. Likely due to GI losses.  - Pt has been receiving MIVFs with 277m KCl. Will d/c fluids today and give K-Dur 4069mtoday. - Daily BMETs  Atropic glossitis: Possibly due to exposure to acidic emesis. Patient asymptomatic and without complaints. - B12 wnl at 557 on 08/27/15. - continue to monitor  LE neuropathy with decreased sensation. Hgb A1c 4.9% on 08/27/15. - PT/OT evaluation to determine extent of limitation - Takes gabapentin 300m23ms at home. Will increase this to 100mg7mthe am, 100mg 77mhe afternoon, and 300mg a15mght.  FEN/GI: Protonix. Pt has been receiving NS + 20mEq K19mt 125 ml/hr. Will d/c these this morning, as she has had good PO intake. Prophylaxis: Heparin  Disposition: Likely home. PT/OT evals pending.  Subjective:  Pt states she is feeling better this morning. She has not had any diarrhea since admission. She has had no vomiting and has tolerating PO liquids without any issues. She also states her abdominal pain is better. She would like to go home today if she  continues to feel well.  Objective: Temp:  [97.7 F (36.5 C)-98.4 F (36.9 C)] 98.1 F (36.7 C) (11/21 0622) Pulse Rate:  [89-113] 92 (11/21 0622) Resp:  [14-22] 18 (11/21 0622) BP: (122-150)/(69-114) 131/69 mmHg (11/21 0622) SpO2:  [94 %-100 %] 95 % (11/21  0622) Weight:  [124 lb 9 oz (56.5 kg)-130 lb (58.968 kg)] 124 lb 9 oz (56.5 kg) (11/20 1744) Physical Exam: General: Tired-appearing, sitting up in bed, conversational. HEET: NCAT, mucous membranes moist, smooth tongue with central absence of papillae, EOMI, PERRLA Neck: Supple Cardiovascular: RRR, S1, S2, no m/r/g, 2+ DP pulses bilaterally Respiratory: CTAB, normal work of breathing, no wheezes or rales Abdomen: soft, non-distended, TTP in LUQ/LLQ/suprapubic area, no guarding or rebound MSK: normal ROM of extremities, no peripherial edema Skin: Intact, no rashes or lesions Neuro: AOx3, diminished sensation to light touch across shins, strength normal. Non-focal exam.  Psych: Anxious appearing  Laboratory:  Recent Labs Lab 09/09/15 0900 09/09/15 1306 09/10/15 0545  WBC 11.6*  --  6.8  HGB 16.0* 15.0 13.5  HCT 44.2 44.0 38.3  PLT 305  --  206    Recent Labs Lab 09/09/15 0900 09/09/15 1306 09/10/15 0545  NA 131* 134* 135  K 2.5* 3.1* 3.2*  CL 88* 91* 96*  CO2 28  --  27  BUN <5* 3* <5*  CREATININE 1.16* 0.90 1.02*  CALCIUM 9.4  --  8.4*  PROT 8.0  --   --   BILITOT 1.1  --   --   ALKPHOS 130*  --   --   ALT 22  --   --   AST 37  --   --   GLUCOSE 146* 119* 96   UA: few bacteria, large leukocytes, positive nitrites, 6-30 WBC.  Imaging/Diagnostic Tests: Abdominal U/s (11/20): no acute abnormalities.  Sela Hua, MD 09/10/2015, 7:13 AM PGY-1, [place] Intern pager: [phone], text pages welcome

## 2015-09-10 NOTE — Progress Notes (Signed)
Pt c/o severe abd pain, pt said vicodine is not working for her but respond much better with delouded md on call paged came down to the unit to reviewed gave her  delouded 2mg  stat , pt verbalised that she feels better now, will continue to monitor

## 2015-09-11 DIAGNOSIS — N390 Urinary tract infection, site not specified: Secondary | ICD-10-CM | POA: Insufficient documentation

## 2015-09-11 LAB — CBC
HEMATOCRIT: 37.6 % (ref 36.0–46.0)
HEMOGLOBIN: 13 g/dL (ref 12.0–15.0)
MCH: 36.5 pg — ABNORMAL HIGH (ref 26.0–34.0)
MCHC: 34.6 g/dL (ref 30.0–36.0)
MCV: 105.6 fL — AB (ref 78.0–100.0)
Platelets: 182 10*3/uL (ref 150–400)
RBC: 3.56 MIL/uL — ABNORMAL LOW (ref 3.87–5.11)
RDW: 15.8 % — AB (ref 11.5–15.5)
WBC: 7.4 10*3/uL (ref 4.0–10.5)

## 2015-09-11 LAB — BASIC METABOLIC PANEL WITH GFR
Anion gap: 10 (ref 5–15)
BUN: <5 mg/dL — ABNORMAL LOW (ref 6–20)
CO2: 25 mmol/L (ref 22–32)
Calcium: 8.5 mg/dL — ABNORMAL LOW (ref 8.9–10.3)
Chloride: 100 mmol/L — ABNORMAL LOW (ref 101–111)
Creatinine, Ser: 0.93 mg/dL (ref 0.44–1.00)
GFR calc Af Amer: >60 mL/min
GFR calc non Af Amer: >60 mL/min
Glucose, Bld: 146 mg/dL — ABNORMAL HIGH (ref 65–99)
Potassium: 2.8 mmol/L — ABNORMAL LOW (ref 3.5–5.1)
Sodium: 135 mmol/L (ref 135–145)

## 2015-09-11 LAB — FOLATE: Folate: 3.7 ng/mL — ABNORMAL LOW

## 2015-09-11 LAB — URINE CULTURE

## 2015-09-11 LAB — C DIFFICILE QUICK SCREEN W PCR REFLEX
C DIFFICILE (CDIFF) TOXIN: NEGATIVE
C DIFFICLE (CDIFF) ANTIGEN: POSITIVE — AB

## 2015-09-11 LAB — BASIC METABOLIC PANEL
Anion gap: 9 (ref 5–15)
BUN: <5 mg/dL — ABNORMAL LOW (ref 6–20)
CALCIUM: 8.2 mg/dL — AB (ref 8.9–10.3)
CO2: 26 mmol/L (ref 22–32)
CREATININE: 0.89 mg/dL (ref 0.44–1.00)
Chloride: 100 mmol/L — ABNORMAL LOW (ref 101–111)
GLUCOSE: 133 mg/dL — AB (ref 65–99)
Potassium: 3.9 mmol/L (ref 3.5–5.1)
Sodium: 135 mmol/L (ref 135–145)

## 2015-09-11 LAB — MAGNESIUM: MAGNESIUM: 1.3 mg/dL — AB (ref 1.7–2.4)

## 2015-09-11 MED ORDER — POTASSIUM CHLORIDE CRYS ER 20 MEQ PO TBCR
40.0000 meq | EXTENDED_RELEASE_TABLET | Freq: Once | ORAL | Status: AC
  Administered 2015-09-11: 40 meq via ORAL
  Filled 2015-09-11: qty 2

## 2015-09-11 MED ORDER — IBUPROFEN 400 MG PO TABS
800.0000 mg | ORAL_TABLET | Freq: Three times a day (TID) | ORAL | Status: DC
  Administered 2015-09-11: 800 mg via ORAL
  Filled 2015-09-11: qty 2

## 2015-09-11 MED ORDER — FOLIC ACID 1 MG PO TABS
1.0000 mg | ORAL_TABLET | Freq: Every day | ORAL | Status: DC
  Administered 2015-09-11: 1 mg via ORAL
  Filled 2015-09-11: qty 1

## 2015-09-11 MED ORDER — POTASSIUM CHLORIDE CRYS ER 20 MEQ PO TBCR: 40.0000 meq | EXTENDED_RELEASE_TABLET | Freq: Every day | ORAL | Status: DC

## 2015-09-11 MED ORDER — GABAPENTIN 300 MG PO CAPS
300.0000 mg | ORAL_CAPSULE | Freq: Three times a day (TID) | ORAL | Status: DC
  Administered 2015-09-11 (×2): 300 mg via ORAL
  Filled 2015-09-11 (×2): qty 1

## 2015-09-11 MED ORDER — MAGNESIUM CHLORIDE 64 MG PO TBEC
2.0000 | DELAYED_RELEASE_TABLET | Freq: Once | ORAL | Status: AC
  Administered 2015-09-11: 128 mg via ORAL
  Filled 2015-09-11: qty 2

## 2015-09-11 MED ORDER — FOLIC ACID 1 MG PO TABS: 1.0000 mg | ORAL_TABLET | Freq: Every day | ORAL | Status: DC

## 2015-09-11 MED ORDER — CEPHALEXIN 250 MG PO CAPS: 250.0000 mg | ORAL_CAPSULE | Freq: Four times a day (QID) | ORAL | Status: DC

## 2015-09-11 MED ORDER — CEPHALEXIN 250 MG PO CAPS
250.0000 mg | ORAL_CAPSULE | Freq: Four times a day (QID) | ORAL | Status: DC
  Administered 2015-09-11 (×3): 250 mg via ORAL
  Filled 2015-09-11 (×5): qty 1

## 2015-09-11 MED ORDER — ONDANSETRON HCL 4 MG PO TABS: 4.0000 mg | ORAL_TABLET | Freq: Three times a day (TID) | ORAL | Status: DC | PRN

## 2015-09-11 MED ORDER — GABAPENTIN 300 MG PO CAPS: 300.0000 mg | ORAL_CAPSULE | Freq: Three times a day (TID) | ORAL | Status: DC

## 2015-09-11 NOTE — Progress Notes (Signed)
Discharge orders received. Pt educated on discharge instructions. Pt verbalized understanding. IV removed. Pt dressed and belongings packed. Pt and belongings taken downstairs by staff via wheelchair.

## 2015-09-11 NOTE — Progress Notes (Signed)
Physical Therapy Treatment [name] MRN: [id number] DOB: 1971-08-31 Today's Date: 09/11/2015    History of Present Illness Pt adm with N/V and diarrhea, Pt also with recent onset of peripheral neuropathy.     PT Comments    Pt with good progress with mobility and ready for dc home from PT standpoint.  Follow Up Recommendations  No PT follow up     Equipment Recommendations  Rolling walker with 5" wheels (already delivered)    Recommendations for Other Services       Precautions / Restrictions Precautions Precautions: Fall Restrictions Weight Bearing Restrictions: No    Mobility  Bed Mobility Overal bed mobility: Independent                Transfers Overall transfer level: Modified independent                  Ambulation/Gait Ambulation/Gait assistance: Modified independent (Device/Increase time) Ambulation Distance (Feet): 170 Feet Assistive device: Rolling walker (2 wheeled) Gait Pattern/deviations: Antalgic Gait velocity: decr Gait velocity interpretation: Below normal speed for age/gender General Gait Details: Steady gait with walker   Stairs            Wheelchair Mobility    Modified Rankin (Stroke Patients Only)       Balance     Sitting balance-Leahy Scale: Normal       Standing balance-Leahy Scale: Fair                      Cognition Arousal/Alertness: Awake/alert Behavior During Therapy: WFL for tasks assessed/performed Overall Cognitive Status: Within Functional Limits for tasks assessed                      Exercises      General Comments        Pertinent Vitals/Pain Pain Assessment: Faces Faces Pain Scale: Hurts even more Pain Location: bil feet Pain Descriptors / Indicators: Burning Pain Intervention(s): Monitored during session;Limited activity within patient's tolerance    Home Living                      Prior Function            PT  Goals (current goals can now be found in the care plan section) Acute Rehab PT Goals Patient Stated Goal: decr pain in feet Progress towards PT goals: Goals met/education completed, patient discharged from PT    Frequency       PT Plan      Co-evaluation             End of Session   Activity Tolerance: Patient limited by pain Patient left: in bed;with call bell/phone within reach;with bed alarm set     Time: 1232-1243 PT Time Calculation (min) (ACUTE ONLY): 11 min  Charges:  $Gait Training: 8-22 mins                    G Codes:  Functional Assessment Tool Used: clinical judgement Functional Limitation: Mobility: Walking and moving around Mobility: Walking and Moving Around Goal Status [phone]): 0 percent impaired, limited or restricted Mobility: Walking and Moving Around Discharge Status [phone]): At least 1 percent but less than 20 percent impaired, limited or restricted   [facility] 09/11/2015, 2:26 PM [facility] PT [phone]

## 2015-09-11 NOTE — Progress Notes (Signed)
Physical Therapy Discharge [name] MRN: [id number] DOB: December 05, 1970 Today's Date: 09/11/2015 Time: 8921-1941 PT Time Calculation (min) (ACUTE ONLY): 11 min  Patient discharged from PT services secondary to goals met and no further PT needs identified.  Please see latest therapy progress note for current level of functioning and progress toward goals.    Progress and discharge plan discussed with patient and/or caregiver: Patient/Caregiver agrees with plan  GP Functional Assessment Tool Used: clinical judgement Functional Limitation: Mobility: Walking and moving around Mobility: Walking and Moving Around Goal Status [phone]): 0 percent impaired, limited or restricted Mobility: Walking and Moving Around Discharge Status [phone]): At least 1 percent but less than 20 percent impaired, limited or restricted   [facility] 09/11/2015, 2:27 PM  [address] [facility] PT [phone]

## 2015-09-11 NOTE — Progress Notes (Signed)
Pharmacist Provided - Patient Medication Education Prior to Discharge   Nishka Stefannie Defeo is an 44 y.o. female who presented to [facility] on 09/09/2015 with a chief complaint of  Chief Complaint  Patient presents with  . Abdominal Pain  . Nausea  . Emesis     [x]  Patient will be discharged with 2 new medications []  Patient being discharged without any new medications  The following medications were discussed with the patient  Pain Control medications: []  Yes    []  No  Diabetes Medications: []  Yes    []  No  Heart Failure Medications: []  Yes    []  No  Anticoagulation Medications:  []  Yes    []  No  Antibiotics at discharge: [x]  Yes    []  No  Allergy Assessment Completed and Updated: [x]  Yes    []  No Identified Patient Allergies:  Allergies  Allergen Reactions  . Iohexol Hives, Itching and Swelling  . Morphine And Related Other (See Comments)    Unknown, patient is unaware of allergy  . Oxycodone Itching    Can tolerate vicodin     Medication Adherence Assessment: []  Excellent (no doses missed/week)      [x]  Good (1 dose missed/week)      []  Partial (2-3 doses missed/week)      []  Poor (>3 doses missed/week)  Barriers to Obtaining Medications: []  Yes [x]  No   Assessment: Counseled pt that would not be continuing abx at discharge due to no UTI s/sx and c diff antigen.  Counseled pt on other new medications including folic acid and vit d.  Pt demonstrated understanding of medication regimen and stated no adherence or cost barriers   Time spent preparing for discharge counseling: 15 min Time spent counseling patient: 10 min  Bennye Alm, PharmD Pharmacy Resident [phone]

## 2015-09-11 NOTE — Care Management Note (Addendum)
Case Management Note  Patient Details  Name: Jamie Allen MRN: [id number] Date of Birth: September 11, 1971  Subjective/Objective:       Date: 09/11/15 Spoke with patient at the bedside.  Introduced self as Tourist information centre manager and explained role in discharge planning and how to be reached.  Verified patient lives in town, with spouse.,  Expressed potential need for a rolling walker, referral made to [facility] with [facility], her brought walker up to patient's room.  Verified patient anticipates to go home with family, at time of discharge and will have part-time supervision by family at this time to best of their knowledge.  Patient confirmed needing help with their medication, she states she goes to [facility] to get meds.  Patient  is driven by spouse to MD appointments.  Verified patient has PCP at [facility]. Her apt is 09/20/15. Patient has no insurance so will not be able to get hhpt, she states she does not have a need for a HHRN.   Plan: CM will continue to follow for discharge planning and [facility] resources.              Action/Plan:   Expected Discharge Date:                  Expected Discharge Plan:  Home/Self Care  In-House Referral:     Discharge planning Services  CM Consult  Post Acute Care Choice:  Durable Medical Equipment Choice offered to:     DME Arranged:  Walker rolling DME Agency:  [place]:    [facility] Agency:     Status of Service:  Completed, signed off  Medicare Important Message Given:    Date Medicare IM Given:    Medicare IM give by:    Date Additional Medicare IM Given:    Additional Medicare Important Message give by:     If discussed at [place] of Stay Meetings, dates discussed:    Additional Comments:  Zenon Mayo, RN 09/11/2015, 1:38 PM

## 2015-09-11 NOTE — Discharge Summary (Signed)
[facility] Discharge Summary  Patient name: Jamie Allen Medical record number: [id number] Date of birth: 05-25-1971 Age: 44 y.o. Gender: female Date of Admission: 09/09/2015  Date of Discharge: 09/11/15 Admitting Physician: Lind Covert, MD  Primary Care Provider: Minerva Ends, MD Consultants: None  Indication for Hospitalization: Intractable nausea/vomiting, poor PO  Discharge Diagnoses/Problem List:  Chronic nausea/vomiting Irritable bowel syndrome, diarrhea predominant NASH Anxiety/depression Hypokalemia Folate deficiency  Disposition: Home with HHPT  Discharge Condition: Stable, improved  Discharge Exam:  General: Tired-appearing, sitting up in bed, conversational and pleasant, becomes tearful when talking about her husband. HEET: NCAT, mucous membranes moist, EOMI Neck: Supple Cardiovascular: RRR, S1, S2, no m/r/g, 2+ DP pulses bilaterally Respiratory: CTAB, normal work of breathing, no wheezes or rales Abdomen: soft, non-distended, mild TTP of the RLQ and suprapubic area, no guarding or rebound MSK: normal ROM of extremities, no peripherial edema Skin: Intact, no rashes or lesions Neuro: AOx3, diminished sensation to light touch across shins, strength normal. Non-focal exam.  Psych: Tearful when talking about her husband.  Brief Hospital Course:  Jamie Allen is a 44 year old female with a PMH of diarrhea-predominant IBS who presented to the ED with nausea, vomiting, diarrhea, abdominal pain, and decreased PO intake x 2 weeks. Lipase was normal, FOBT was negative, her vitals were stable and she was afebrile. UA was significant for trace Hgb, +nitrites, large leuks, and few bacteria. We thought her UTI may be an underlying cause of her nausea/vomiting/abdominal pain. We treated her with Rocephin for 2 days and then transitioned her to Keflex. We ordered an abdominal U/S, which showed no acute abnormality. We continued her  home Bentyl and started a PPI. We gave her IV Zofran for nausea. We also ordered a stool culture, C. Diff toxin, and GI pathogen panel. C. Diff toxin was negative, but antigen was positive. Because patient was no longer having abdominal pain or pressure with urination, the decision was made not to prescribe Keflex for further treatment of UTI to reduce risk of inducing true C diff infection. Stool culture and GI pathogen panel were pending at the time of discharge. Patient was given strict return precautions.  During her hospitalization, Jamie Allen was found to have hypokalemia and hypomagnesemia. We replaced both of these. She was discharged home on K-Dur 81mEq daily. She also endorsed bilateral lower extremity peripheral neuropathy starting 3 weeks ago. Vitamin B12 and TSH were drawn recently at her PCP's office and were normal. We ordered folate and HIV test, as these can both cause peripheral neuropathy. Folate was low, so we replaced it and discharged her home with a prescription. We also increased her Gabapentin from 300mg  qhs to 300mg  tid. HIV was pending at the time of discharge.  Issues for Follow Up:  1. Pt was discharged home with K-Dur 11mEq daily. We recommend repeat BMET and Mag as an outpatient.  2. We have scheduled her an appointment at [facility] on December 14th at 10:00am for follow-up of her IBS. 3. Jamie Allen endorsed peripheral neuropathy. Her folate was found to be low, which could be a potential contributor. We discharged her with a prescription for Folate to take daily. Please follow-up with her neuropathic pain.  Significant Procedures: None  Significant Labs and Imaging:   Recent Labs Lab 09/09/15 0900 09/09/15 1306 09/10/15 0545 09/11/15 0821  WBC 11.6*  --  6.8 7.4  HGB 16.0* 15.0 13.5 13.0  HCT 44.2 44.0 38.3 37.6  PLT  305  --  206 182    Recent Labs Lab 09/09/15 0900 09/09/15 1306 09/10/15 0545 09/11/15 0821 09/11/15 1353  NA  131* 134* 135 135 135  K 2.5* 3.1* 3.2* 2.8* 3.9  CL 88* 91* 96* 100* 100*  CO2 28  --  27 25 26   GLUCOSE 146* 119* 96 146* 133*  BUN <5* 3* <5* <5* <5*  CREATININE 1.16* 0.90 1.02* 0.93 0.89  CALCIUM 9.4  --  8.4* 8.5* 8.2*  MG  --   --   --   --  1.3*  ALKPHOS 130*  --   --   --   --   AST 37  --   --   --   --   ALT 22  --   --   --   --   ALBUMIN 3.5  --   --   --   --    Folate 3.7 C. Diff antigen positive, C. Diff toxin negative Magnesium 1.3   Results/Tests Pending at Time of Discharge: HIV, GI pathogen panel, stool culture.  Discharge Medications:    Medication List    TAKE these medications        dicyclomine 10 MG capsule  Commonly known as:  BENTYL  Take 10 mg by mouth 4 (four) times daily.     diphenhydrAMINE 25 MG tablet  Commonly known as:  BENADRYL  Take 25 mg by mouth daily.     esomeprazole 40 MG capsule  Commonly known as:  NEXIUM  Take 1 capsule (40 mg total) by mouth daily. For acid reflux     folic acid 1 MG tablet  Commonly known as:  FOLVITE  Take 1 tablet (1 mg total) by mouth daily.     gabapentin 300 MG capsule  Commonly known as:  NEURONTIN  Take 1 capsule (300 mg total) by mouth 3 (three) times daily.     ibuprofen 200 MG tablet  Commonly known as:  ADVIL,MOTRIN  Take 800 mg by mouth every 6 (six) hours as needed.     ondansetron 4 MG tablet  Commonly known as:  ZOFRAN  Take 1 tablet (4 mg total) by mouth every 8 (eight) hours as needed for nausea or vomiting.     potassium chloride SA 20 MEQ tablet  Commonly known as:  K-DUR,KLOR-CON  Take 2 tablets (40 mEq total) by mouth daily.     Vitamin D (Ergocalciferol) 50000 UNITS Caps capsule  Commonly known as:  DRISDOL  Take 1 capsule (50,000 Units total) by mouth every 7 (seven) days. For 8 weeks        Discharge Instructions: Please refer to Patient Instructions section of EMR for full details.  Patient was counseled important signs and symptoms that should prompt return to  medical care, changes in medications, dietary instructions, activity restrictions, and follow up appointments.   Follow-Up Appointments: Follow-up Information    Follow up with [facility] On 09/20/2015.   Why:  9 am for hospital follow up   Contact information:   [address] [place] [id number] [phone]      Follow up with Laban Emperor, NP On 10/03/2015.   Specialty:  Gastroenterology   Why:  New patient appointment at GI office at 10:00am.   Contact information:   [address] [place] [address] [phone]       Sela Hua, MD 09/11/2015, 8:11 PM PGY-1, [place]

## 2015-09-11 NOTE — Progress Notes (Signed)
Family Medicine Teaching Service Daily Progress Note Intern Pager: [phone]  Patient name: Jamie Allen Medical record number: [id number] Date of birth: October 06, 1971 Age: 44 y.o. Gender: female  Primary Care Provider: Minerva Ends, MD Consultants: None Code Status: DNR  Pt Overview and Major Events to Date:  11/21: Admitted to [facility] with intractable nausea and vomiting.  Assessment and Plan: Jamie Allen is a 44 y.o. female presenting with 2 week history of nausea and vomiting with limited PO intake. PMH is significant for HTN, diarrhea-predominant IBS, NASH, GERD, colitis, PTSD, gallbladder disease and history of C diff requiring inpatient treatment for 2 months, per patient.   Abdominal pain, improved:Multiple etiologies possible including infectious (UTI), inflammatory (IBS), GERD, less likely appendicitis. No signs of acute abdomen this morning. Abdomen is soft and non-distended with mild tenderness to palpation of LUQ, LLQ, and suprapubic area. She had abdominal pain for >1 yr. Chronic in nature from IBS. Previous work-up unremarkable. Labs in the ED showed lipase 33, FOBT neg, WBC 11.6. Abdominal u/s (11/20) showed no acute abnormalities. She has been afebrile with HRs ranging from 89-113, BPs 122-150/76-114.  - Tylenol and norco prn for pain. Received norco x 5 in the last 24 hours. - Continue home Bentyl - Protonix 40mg  daily  Diarrhea, improved: Recently diagnosed with IBS-diarrhea predominate. Chronic vs. acute on chronic. FOBT negative. Pt had 1 small episodes of diarrhea yesterday evening. - Will evaluate for infectious etiologies - C. diff toxin, GI pathogen panel, stool culture have not been collected. - Continue home Bentyl - Pt has follow-up appointment scheduled with her GI doctor next week.  Nasuea/Vomiting, resolved: Hx of atrophic gastritis. Could have worsening GERD. Lipase wnl. Obstruction unlikely given frequent diarrhea. Urine pregnancy test  negative 08/17/15. Has not had any vomiting since admission. - IV zofran prn. She has not received any prn Zofran in the last 24 hours.  UTI: UA with trace hgb, pos nitrites, large leuks, few bacteria. Could be an underlying cause of nausea/vomiting/abdominal pain. Pt unsure if having symptoms because never had before. Afebrile. WBC 7.4. - Pt has received IV Rocephin x 1. Will transition to Keflex 250mg  qid today for 5 day course. - Urine culture reincubated for better growth.  Hypokalemia: In ED K was 2.5 > 2.8 this am. Likely due to GI losses. She has been receiving K-Dur 30mEq daily. - Will give K-Dur 34mEq this morning. - Daily BMETs  Atropic glossitis: Possibly due to exposure to acidic emesis. Patient asymptomatic and without complaints. - B12 wnl at 557 on 08/27/15. - continue to monitor  LE neuropathy with decreased sensation. This started 3 weeks ago. She had back pain for 3 days and then developed neuropathy up to her thighs, which has since improved to up to her knees. Differentials include diabetic neuropathy (unlikely, as her last HgbA1c was 4.9%), vitamin B12 deficiency (last B12 was 557 on 08/2015), folate deficiency (last folate was 1.8 in 2014), hypothyroidism (last TSH was 2.988 on 08/2015), Guillian-Barre (less likely because the neuropathy is regressing rather than spreading upwards), Amyloidosis, HIV (which can cause a distal symmetric neuropathy).  - PT recommending HHPT, but Pt declined. She will think about this more today. - Will check an HIV and folate level today to rule out other causes of her neuropathy. - Will increase Gabapentin to 300mg  tid. - Recommend follow-up as an outpatient.  FEN/GI: Protonix. Heart healthy diet. Prophylaxis: Heparin sq.  Disposition: Home, likely today.  Subjective:  Pt is feeling much better  this morning. Her diarrhea, nausea, vomiting, and abdominal pain have all improved. She only had one small episode of diarrhea yesterday, but  flushed it before a stool sample could be collected. She has been drinking well and would like to try drinking more today. She was ready to go home yesterday; however, her husband called her and yelled at her, demanding that she stay in the hospital. Her main concern at this point is her peripheral neuropathy, which has kept her from being able to walk around at home. The peripheral neuropathy has been going on for the last 3 weeks. She was having problems with her bowels and laid in bed all day. She then had back pain for 3 days, followed by new peripheral neuropathy up to her mid thigh. The neuropathy then improved and currently only extends to her knees bilaterally. She has not been able to get an appointment with her PCP. PT saw her yesterday and recommended HHPT. She wants to have HHPT, but she declined because her house is messy. She will think about this more today and decide if she wants HHPT.  Objective: Temp:  [97.6 F (36.4 C)-98.5 F (36.9 C)] 98.5 F (36.9 C) (11/22 [id number]) Pulse Rate:  [89-102] 102 (11/22 0632) Resp:  [18-19] 19 (11/22 0632) BP: (122-143)/(69-89) 135/87 mmHg (11/22 0632) SpO2:  [96 %-98 %] 98 % (11/22 [id number]) Physical Exam: General: Tired-appearing, sitting up in bed, conversational and pleasant, becomes tearful when talking about her husband. HEET: NCAT, mucous membranes moist, EOMI Neck: Supple Cardiovascular: RRR, S1, S2, no m/r/g, 2+ DP pulses bilaterally Respiratory: CTAB, normal work of breathing, no wheezes or rales Abdomen: soft, non-distended, mild TTP of the RLQ and suprapubic area, no guarding or rebound MSK: normal ROM of extremities, no peripherial edema Skin: Intact, no rashes or lesions Neuro: AOx3, diminished sensation to light touch across shins, strength normal. Non-focal exam.  Psych: Tearful when talking about her husband.  Laboratory:  Recent Labs Lab 09/09/15 0900 09/09/15 1306 09/10/15 0545  WBC 11.6*  --  6.8  HGB 16.0* 15.0 13.5   HCT 44.2 44.0 38.3  PLT 305  --  206    Recent Labs Lab 09/09/15 0900 09/09/15 1306 09/10/15 0545  NA 131* 134* 135  K 2.5* 3.1* 3.2*  CL 88* 91* 96*  CO2 28  --  27  BUN <5* 3* <5*  CREATININE 1.16* 0.90 1.02*  CALCIUM 9.4  --  8.4*  PROT 8.0  --   --   BILITOT 1.1  --   --   ALKPHOS 130*  --   --   ALT 22  --   --   AST 37  --   --   GLUCOSE 146* 119* 96   UA: few bacteria, large leukocytes, positive nitrites, 6-30 WBC.  Imaging/Diagnostic Tests: Abdominal U/s (11/20): no acute abnormalities.  Sela Hua, MD 09/11/2015, 7:18 AM PGY-1, [place] Intern pager: [phone], text pages welcome

## 2015-09-11 NOTE — Discharge Instructions (Signed)
You were hospitalized for diarrhea, nausea, vomiting, and abdominal pain. We think your diarrhea is likely caused by your IBS. Your nausea, vomiting, and abdominal pain may have been caused by your urinary tract infection. You should be treated now that you have had IV antibiotics, but if you have pain with urination again, you may need further treatment. The antibiotic Keflex is at your pharmacy, but you do not need to fill it. We recommend re-checking your urine at your follow-up appointment.  There are many different things that can cause peripheral neuropathy. Some of these things (Vitamin B12 deficiency and low thyroid) have already been checked by your primary care physician and were normal. We have checked some more labs while you were here, including folate. We also gave you a dose of folate while you were here to see if that would help your neuropathy. Your magnesium and potassium were also low, so we gave you some doses of these electrolytes as well. I will prescribe some Folate and Potassium for you to take at home to prevent these levels from getting low in the future. You have an appointment scheduled with your primary care doctor on September 20, 2015. We recommend that you follow-up with her so that she can continue to treat your neuropathy.  You have been having some difficulty keeping food down at home. I will prescribe an anti-nausea medication for you to take, which will hopefully help you be able to hold foods down in the future. If you cannot keep foods down, we recommend that you drink Boost Breeze drinks three times a day. This will help give you calories, protein, and vitamins.

## 2015-09-12 ENCOUNTER — Telehealth: Payer: Self-pay | Admitting: Family Medicine

## 2015-09-12 ENCOUNTER — Other Ambulatory Visit: Payer: Self-pay | Admitting: Family Medicine

## 2015-09-12 LAB — HIV ANTIBODY (ROUTINE TESTING W REFLEX): HIV Screen 4th Generation wRfx: NONREACTIVE

## 2015-09-12 NOTE — Telephone Encounter (Signed)
Patient called and requested a med refill for Tramadol, please f/u with pt.

## 2015-09-15 LAB — STOOL CULTURE: SPECIAL REQUESTS: NORMAL

## 2015-09-17 ENCOUNTER — Other Ambulatory Visit: Payer: Self-pay | Admitting: Family Medicine

## 2015-09-17 LAB — GI PATHOGEN PANEL BY PCR, STOOL
CAMPYLOBACTER BY PCR: NOT DETECTED
CRYPTOSPORIDIUM BY PCR: NOT DETECTED
E COLI 0157 BY PCR: NOT DETECTED
E coli (ETEC) LT/ST: NOT DETECTED
E coli (STEC): NOT DETECTED
G lamblia by PCR: NOT DETECTED
Norovirus GI/GII: NOT DETECTED
ROTAVIRUS A BY PCR: NOT DETECTED
Salmonella by PCR: NOT DETECTED
Shigella by PCR: NOT DETECTED

## 2015-09-17 NOTE — Telephone Encounter (Signed)
Left voice message, Rx at front office

## 2015-09-20 ENCOUNTER — Ambulatory Visit: Payer: No Typology Code available for payment source | Attending: Family Medicine | Admitting: Family Medicine

## 2015-09-20 ENCOUNTER — Encounter: Payer: Self-pay | Admitting: Family Medicine

## 2015-09-20 DIAGNOSIS — K589 Irritable bowel syndrome without diarrhea: Secondary | ICD-10-CM | POA: Insufficient documentation

## 2015-09-20 DIAGNOSIS — G5793 Unspecified mononeuropathy of bilateral lower limbs: Secondary | ICD-10-CM

## 2015-09-20 DIAGNOSIS — E876 Hypokalemia: Secondary | ICD-10-CM | POA: Insufficient documentation

## 2015-09-20 DIAGNOSIS — F17210 Nicotine dependence, cigarettes, uncomplicated: Secondary | ICD-10-CM | POA: Insufficient documentation

## 2015-09-20 DIAGNOSIS — G629 Polyneuropathy, unspecified: Secondary | ICD-10-CM | POA: Insufficient documentation

## 2015-09-20 DIAGNOSIS — G5792 Unspecified mononeuropathy of left lower limb: Secondary | ICD-10-CM | POA: Insufficient documentation

## 2015-09-20 DIAGNOSIS — G5791 Unspecified mononeuropathy of right lower limb: Secondary | ICD-10-CM | POA: Insufficient documentation

## 2015-09-20 DIAGNOSIS — R112 Nausea with vomiting, unspecified: Secondary | ICD-10-CM | POA: Insufficient documentation

## 2015-09-20 DIAGNOSIS — E559 Vitamin D deficiency, unspecified: Secondary | ICD-10-CM | POA: Insufficient documentation

## 2015-09-20 DIAGNOSIS — Z79899 Other long term (current) drug therapy: Secondary | ICD-10-CM | POA: Insufficient documentation

## 2015-09-20 DIAGNOSIS — K760 Fatty (change of) liver, not elsewhere classified: Secondary | ICD-10-CM | POA: Insufficient documentation

## 2015-09-20 MED ORDER — TRAMADOL HCL 50 MG PO TABS: 100.0000 mg | ORAL_TABLET | Freq: Three times a day (TID) | ORAL | Status: DC | PRN

## 2015-09-20 MED ORDER — GABAPENTIN 300 MG PO CAPS: 600.0000 mg | ORAL_CAPSULE | Freq: Three times a day (TID) | ORAL | Status: DC

## 2015-09-20 MED ORDER — ONDANSETRON HCL 4 MG PO TABS: 4.0000 mg | ORAL_TABLET | Freq: Three times a day (TID) | ORAL | Status: DC | PRN

## 2015-09-20 MED ORDER — CLONAZEPAM 0.5 MG PO TABS: 0.5000 mg | ORAL_TABLET | Freq: Two times a day (BID) | ORAL | Status: DC | PRN

## 2015-09-20 MED ORDER — VITAMIN B-1 50 MG PO TABS: 50.0000 mg | ORAL_TABLET | Freq: Every day | ORAL | Status: DC

## 2015-09-20 MED ORDER — PREGABALIN 50 MG PO CAPS: 50.0000 mg | ORAL_CAPSULE | Freq: Three times a day (TID) | ORAL | Status: DC

## 2015-09-20 NOTE — Patient Instructions (Signed)
Dinita was seen today for hospitalization follow-up.  Diagnoses and all orders for this visit:  Neuropathic pain, leg, bilateral -     pregabalin (LYRICA) 50 MG capsule; Take 1 capsule (50 mg total) by mouth 3 (three) times daily. -     gabapentin (NEURONTIN) 300 MG capsule; Take 2 capsules (600 mg total) by mouth 3 (three) times daily. -     traMADol (ULTRAM) 50 MG tablet; Take 2 tablets (100 mg total) by mouth every 8 (eight) hours as needed. -     clonazePAM (KLONOPIN) 0.5 MG tablet; Take 1 tablet (0.5 mg total) by mouth 2 (two) times daily as needed (neuropathy). -     Ambulatory referral to Neurology  IBS (irritable bowel syndrome) -     ondansetron (ZOFRAN) 4 MG tablet; Take 1 tablet (4 mg total) by mouth every 8 (eight) hours as needed for nausea or vomiting.  Other orders -     Cancel: POCT urinalysis dipstick   Increased gabapentin, increased tramadol to 100 mg twice daily, added klonopin twice daily Apply for lyrica via PASS  F/u in 4 weeks for painful neuropathy   Dr. Adrian Blackwater

## 2015-09-20 NOTE — Assessment & Plan Note (Signed)
A: ? Thiamine deficiency with dry beri beri P: Thiamine test And thiamine supplement started Symptom control with increased gabapentin, tramadol, klonopin lyrica to replace gabapentin, there will be a delay as she has to apply for PASS first.

## 2015-09-20 NOTE — Progress Notes (Signed)
F/U UTI, leg pain, lab results  Pain scale #10 Numbness on both legs Tobacco user 6 cigarette per day  No suicide thought in the past two weeks

## 2015-09-20 NOTE — Progress Notes (Signed)
Patient ID: Jamie Allen, female   DOB: October 30, 1970, 44 y.o.   MRN: [id number]   Subjective:  Patient ID: Jamie Allen, female    DOB: 05/28/1971  Age: 44 y.o. MRN: [id number]  CC: Hospitalization Follow-up   HPI Jamie Allen presents for hospitalization f/u. She was hospitalized from 11/20-11/22/2016. She has nausea and vomiting with poor PO intake. She has hypokalemia. Urine culture was negative. C diff antigen was positive with a negative toxin suggesting colonization, no infection, and stool culture was negative. ABUS revealed diffuse fatty liver with mild left renal atrophy and scarring. She is taking zofran which is controlling nausea. She denies emesis. No abdominal pain.   2. Neuropathy: Today she complains of pain and numbness in legs. She has burning sensation in feet. She has weakness of both ankles. She is unable to stand without assistance. This has been an going problem since 07/2015. She has lumbar MRI done that was normal on 08/17/2015.  She had labs done, TSH and vit B12 normal. Vit D deficiency. She reports tramadol does not help with pain. Gabapentin does help temporarily. She denies ETOH intake.   IMPRESSION: Unremarkable lumbar spine MRI. No disc protrusion, vertebral body abnormality, or compressive lesion.  Social History  Substance Use Topics  . Smoking status: Current Every Day Smoker -- 0.50 packs/day for 20 years    Types: Cigarettes  . Smokeless tobacco: Not on file  . Alcohol Use: No    Outpatient Prescriptions Prior to Visit  Medication Sig Dispense Refill  . dicyclomine (BENTYL) 10 MG capsule Take 10 mg by mouth 4 (four) times daily.  1  . diphenhydrAMINE (BENADRYL) 25 MG tablet Take 25 mg by mouth daily.    Marland Kitchen esomeprazole (NEXIUM) 40 MG capsule Take 1 capsule (40 mg total) by mouth daily. For acid reflux    . folic acid (FOLVITE) 1 MG tablet Take 1 tablet (1 mg total) by mouth daily. 30 tablet 3  . gabapentin (NEURONTIN) 300 MG capsule  Take 1 capsule (300 mg total) by mouth 3 (three) times daily. 90 capsule 3  . ibuprofen (ADVIL,MOTRIN) 200 MG tablet Take 800 mg by mouth every 6 (six) hours as needed.    . ondansetron (ZOFRAN) 4 MG tablet Take 1 tablet (4 mg total) by mouth every 8 (eight) hours as needed for nausea or vomiting. 90 tablet 0  . potassium chloride SA (K-DUR,KLOR-CON) 20 MEQ tablet Take 2 tablets (40 mEq total) by mouth daily. 20 tablet 0  . traMADol (ULTRAM) 50 MG tablet Take 1 tablet (50 mg total) by mouth every 8 (eight) hours as needed. 60 tablet 0  . Vitamin D, Ergocalciferol, (DRISDOL) 50000 UNITS CAPS capsule Take 1 capsule (50,000 Units total) by mouth every 7 (seven) days. For 8 weeks 4 capsule 2   No facility-administered medications prior to visit.    ROS Review of Systems  Constitutional: Negative for fever and chills.  Eyes: Negative for visual disturbance.  Respiratory: Negative for shortness of breath.   Cardiovascular: Negative for chest pain.  Gastrointestinal: Negative for abdominal pain and blood in stool.  Musculoskeletal: Positive for myalgias. Negative for back pain and arthralgias.  Skin: Negative for rash.  Allergic/Immunologic: Negative for immunocompromised state.  Neurological: Positive for numbness.  Hematological: Negative for adenopathy. Does not bruise/bleed easily.  Psychiatric/Behavioral: Negative for suicidal ideas and dysphoric mood.    Objective:  BP 113/78 mmHg  Pulse 93  Temp(Src) 98.1 F (36.7 C) (Oral)  Resp 16  Ht  5\' 1"  (1.549 m)  Wt 115 lb (52.164 kg)  BMI 21.74 kg/m2  SpO2 95%  BP/Weight 09/20/2015 09/11/2015 0000000  Systolic BP 123456 [id number] -  Diastolic BP 78 85 -  Wt. (Lbs) 115 - 124.56  BMI 21.74 - 23.55  Some encounter information is confidential and restricted. Go to Review Flowsheets activity to see all data.   Physical Exam  Constitutional: She is oriented to person, place, and time. She appears well-developed and well-nourished. No distress.    HENT:  Head: Normocephalic and atraumatic.  Cardiovascular: Normal rate, regular rhythm, normal heart sounds and intact distal pulses.   Pulses:      Dorsalis pedis pulses are 2+ on the right side, and 2+ on the left side.       Posterior tibial pulses are 2+ on the right side, and 2+ on the left side.  Pulmonary/Chest: Effort normal and breath sounds normal.  Musculoskeletal: She exhibits no edema.       Right ankle: She exhibits decreased range of motion.       Left ankle: She exhibits decreased range of motion. No tenderness.  Neurological: She is alert and oriented to person, place, and time.  Skin: Skin is warm and dry. No rash noted.  Psychiatric: She has a normal mood and affect.     Assessment & Plan:   Problem List Items Addressed This Visit    IBS (irritable bowel syndrome) (Chronic)   Relevant Medications   ondansetron (ZOFRAN) 4 MG tablet   Neuropathic pain, leg, bilateral (Chronic)    A: ? Thiamine deficiency with dry beri beri P: Thiamine test And thiamine supplement started Symptom control with increased gabapentin, tramadol, klonopin lyrica to replace gabapentin, there will be a delay as she has to apply for PASS first.       Relevant Medications   pregabalin (LYRICA) 50 MG capsule   gabapentin (NEURONTIN) 300 MG capsule   clonazePAM (KLONOPIN) 0.5 MG tablet   traMADol (ULTRAM) 50 MG tablet   thiamine (VITAMIN B-1) 50 MG tablet   Other Relevant Orders   Ambulatory referral to Neurology   Vitamin B1      No orders of the defined types were placed in this encounter.    Follow-up: No Follow-up on file.   Boykin Nearing MD

## 2015-09-23 ENCOUNTER — Emergency Department (HOSPITAL_COMMUNITY): Payer: MEDICAID

## 2015-09-23 ENCOUNTER — Encounter (HOSPITAL_COMMUNITY): Payer: Self-pay | Admitting: Emergency Medicine

## 2015-09-23 ENCOUNTER — Inpatient Hospital Stay (HOSPITAL_COMMUNITY)
Admission: EM | Admit: 2015-09-23 | Discharge: 2015-09-27 | DRG: 563 | Disposition: A | Discharge status: Home Health | Payer: Self-pay | Attending: Family Medicine | Admitting: Family Medicine

## 2015-09-23 ENCOUNTER — Emergency Department (HOSPITAL_COMMUNITY): Payer: Self-pay

## 2015-09-23 DIAGNOSIS — F329 Major depressive disorder, single episode, unspecified: Secondary | ICD-10-CM | POA: Diagnosis present

## 2015-09-23 DIAGNOSIS — G5793 Unspecified mononeuropathy of bilateral lower limbs: Secondary | ICD-10-CM

## 2015-09-23 DIAGNOSIS — K219 Gastro-esophageal reflux disease without esophagitis: Secondary | ICD-10-CM | POA: Diagnosis present

## 2015-09-23 DIAGNOSIS — K7460 Unspecified cirrhosis of liver: Secondary | ICD-10-CM

## 2015-09-23 DIAGNOSIS — K589 Irritable bowel syndrome without diarrhea: Secondary | ICD-10-CM

## 2015-09-23 DIAGNOSIS — S82899A Other fracture of unspecified lower leg, initial encounter for closed fracture: Secondary | ICD-10-CM | POA: Diagnosis present

## 2015-09-23 DIAGNOSIS — W1830XA Fall on same level, unspecified, initial encounter: Secondary | ICD-10-CM | POA: Diagnosis present

## 2015-09-23 DIAGNOSIS — I10 Essential (primary) hypertension: Secondary | ICD-10-CM | POA: Diagnosis present

## 2015-09-23 DIAGNOSIS — K7581 Nonalcoholic steatohepatitis (NASH): Secondary | ICD-10-CM | POA: Diagnosis present

## 2015-09-23 DIAGNOSIS — Z885 Allergy status to narcotic agent status: Secondary | ICD-10-CM

## 2015-09-23 DIAGNOSIS — R52 Pain, unspecified: Secondary | ICD-10-CM | POA: Diagnosis present

## 2015-09-23 DIAGNOSIS — K580 Irritable bowel syndrome with diarrhea: Secondary | ICD-10-CM | POA: Diagnosis present

## 2015-09-23 DIAGNOSIS — S82892A Other fracture of left lower leg, initial encounter for closed fracture: Principal | ICD-10-CM | POA: Diagnosis present

## 2015-09-23 DIAGNOSIS — Z888 Allergy status to other drugs, medicaments and biological substances status: Secondary | ICD-10-CM

## 2015-09-23 DIAGNOSIS — R531 Weakness: Secondary | ICD-10-CM | POA: Insufficient documentation

## 2015-09-23 DIAGNOSIS — Z79899 Other long term (current) drug therapy: Secondary | ICD-10-CM

## 2015-09-23 DIAGNOSIS — R000 Tachycardia, unspecified: Secondary | ICD-10-CM | POA: Diagnosis not present

## 2015-09-23 DIAGNOSIS — D649 Anemia, unspecified: Secondary | ICD-10-CM | POA: Diagnosis present

## 2015-09-23 DIAGNOSIS — G629 Polyneuropathy, unspecified: Secondary | ICD-10-CM | POA: Diagnosis present

## 2015-09-23 DIAGNOSIS — F419 Anxiety disorder, unspecified: Secondary | ICD-10-CM | POA: Diagnosis present

## 2015-09-23 DIAGNOSIS — F17210 Nicotine dependence, cigarettes, uncomplicated: Secondary | ICD-10-CM | POA: Diagnosis present

## 2015-09-23 DIAGNOSIS — F4310 Post-traumatic stress disorder, unspecified: Secondary | ICD-10-CM | POA: Diagnosis present

## 2015-09-23 LAB — CBC WITH DIFFERENTIAL/PLATELET
BASOS ABS: 0 10*3/uL (ref 0.0–0.1)
BASOS PCT: 0 %
EOS ABS: 0.1 10*3/uL (ref 0.0–0.7)
EOS PCT: 1 %
HCT: 39 % (ref 36.0–46.0)
Hemoglobin: 13 g/dL (ref 12.0–15.0)
LYMPHS PCT: 13 %
Lymphs Abs: 0.9 10*3/uL (ref 0.7–4.0)
MCH: 36.2 pg — ABNORMAL HIGH (ref 26.0–34.0)
MCHC: 33.3 g/dL (ref 30.0–36.0)
MCV: 108.6 fL — AB (ref 78.0–100.0)
MONO ABS: 0.4 10*3/uL (ref 0.1–1.0)
Monocytes Relative: 5 %
Neutro Abs: 5.8 10*3/uL (ref 1.7–7.7)
Neutrophils Relative %: 81 %
PLATELETS: 435 10*3/uL — AB (ref 150–400)
RBC: 3.59 MIL/uL — ABNORMAL LOW (ref 3.87–5.11)
RDW: 16.5 % — AB (ref 11.5–15.5)
WBC: 7.2 10*3/uL (ref 4.0–10.5)

## 2015-09-23 LAB — URINALYSIS, ROUTINE W REFLEX MICROSCOPIC
Bilirubin Urine: NEGATIVE
Glucose, UA: NEGATIVE mg/dL
Hgb urine dipstick: NEGATIVE
Ketones, ur: NEGATIVE mg/dL
Leukocytes, UA: NEGATIVE
NITRITE: NEGATIVE
PH: 6 (ref 5.0–8.0)
Protein, ur: NEGATIVE mg/dL
SPECIFIC GRAVITY, URINE: 1.006 (ref 1.005–1.030)

## 2015-09-23 LAB — I-STAT TROPONIN, ED: Troponin i, poc: 0 ng/mL (ref 0.00–0.08)

## 2015-09-23 LAB — COMPREHENSIVE METABOLIC PANEL
ALT: 40 U/L (ref 14–54)
AST: 44 U/L — AB (ref 15–41)
Albumin: 3.8 g/dL (ref 3.5–5.0)
Alkaline Phosphatase: 154 U/L — ABNORMAL HIGH (ref 38–126)
Anion gap: 10 (ref 5–15)
CHLORIDE: 105 mmol/L (ref 101–111)
CO2: 25 mmol/L (ref 22–32)
Calcium: 10.5 mg/dL — ABNORMAL HIGH (ref 8.9–10.3)
Creatinine, Ser: 0.88 mg/dL (ref 0.44–1.00)
Glucose, Bld: 117 mg/dL — ABNORMAL HIGH (ref 65–99)
POTASSIUM: 4 mmol/L (ref 3.5–5.1)
SODIUM: 140 mmol/L (ref 135–145)
Total Bilirubin: 0.7 mg/dL (ref 0.3–1.2)
Total Protein: 8.5 g/dL — ABNORMAL HIGH (ref 6.5–8.1)

## 2015-09-23 LAB — ETHANOL

## 2015-09-23 LAB — RAPID URINE DRUG SCREEN, HOSP PERFORMED
AMPHETAMINES: NOT DETECTED
BENZODIAZEPINES: POSITIVE — AB
Barbiturates: NOT DETECTED
Cocaine: NOT DETECTED
OPIATES: NOT DETECTED
Tetrahydrocannabinol: POSITIVE — AB

## 2015-09-23 MED ORDER — POTASSIUM CHLORIDE CRYS ER 20 MEQ PO TBCR
40.0000 meq | EXTENDED_RELEASE_TABLET | Freq: Every day | ORAL | Status: DC
  Administered 2015-09-23 – 2015-09-27 (×5): 40 meq via ORAL
  Filled 2015-09-23 (×5): qty 2

## 2015-09-23 MED ORDER — HEPARIN SODIUM (PORCINE) 5000 UNIT/ML IJ SOLN
5000.0000 [IU] | Freq: Three times a day (TID) | INTRAMUSCULAR | Status: DC
  Administered 2015-09-23 – 2015-09-27 (×12): 5000 [IU] via SUBCUTANEOUS
  Filled 2015-09-23 (×10): qty 1

## 2015-09-23 MED ORDER — KETOROLAC TROMETHAMINE 60 MG/2ML IM SOLN
60.0000 mg | Freq: Once | INTRAMUSCULAR | Status: AC
  Administered 2015-09-23: 60 mg via INTRAMUSCULAR
  Filled 2015-09-23: qty 2

## 2015-09-23 MED ORDER — FOLIC ACID 1 MG PO TABS
1.0000 mg | ORAL_TABLET | Freq: Every day | ORAL | Status: DC
Start: 2015-09-23 — End: 2015-09-27
  Administered 2015-09-23 – 2015-09-27 (×5): 1 mg via ORAL
  Filled 2015-09-23 (×5): qty 1

## 2015-09-23 MED ORDER — SODIUM CHLORIDE 0.9 % IV SOLN
INTRAVENOUS | Status: AC
Start: 2015-09-23 — End: 2015-09-24
  Administered 2015-09-23: 15:00:00 via INTRAVENOUS

## 2015-09-23 MED ORDER — GABAPENTIN 300 MG PO CAPS
600.0000 mg | ORAL_CAPSULE | Freq: Three times a day (TID) | ORAL | Status: DC
  Administered 2015-09-23 – 2015-09-26 (×9): 600 mg via ORAL
  Filled 2015-09-23 (×9): qty 2

## 2015-09-23 MED ORDER — DIPHENHYDRAMINE HCL 50 MG/ML IJ SOLN
50.0000 mg | Freq: Once | INTRAMUSCULAR | Status: AC
  Administered 2015-09-23: 50 mg via INTRAVENOUS
  Filled 2015-09-23: qty 1

## 2015-09-23 MED ORDER — DICYCLOMINE HCL 10 MG PO CAPS
10.0000 mg | ORAL_CAPSULE | Freq: Four times a day (QID) | ORAL | Status: DC
  Administered 2015-09-23 – 2015-09-27 (×18): 10 mg via ORAL
  Filled 2015-09-23 (×18): qty 1

## 2015-09-23 MED ORDER — IOHEXOL 350 MG/ML SOLN
100.0000 mL | Freq: Once | INTRAVENOUS | Status: AC | PRN
  Administered 2015-09-23: 100 mL via INTRAVENOUS

## 2015-09-23 MED ORDER — HYDROMORPHONE HCL 1 MG/ML IJ SOLN
1.0000 mg | Freq: Once | INTRAMUSCULAR | Status: AC
  Administered 2015-09-23: 1 mg via INTRAVENOUS
  Filled 2015-09-23: qty 1

## 2015-09-23 MED ORDER — PANTOPRAZOLE SODIUM 40 MG PO TBEC
40.0000 mg | DELAYED_RELEASE_TABLET | Freq: Every day | ORAL | Status: DC
Start: 2015-09-23 — End: 2015-09-27
  Administered 2015-09-23 – 2015-09-27 (×5): 40 mg via ORAL
  Filled 2015-09-23 (×5): qty 1

## 2015-09-23 MED ORDER — PREGABALIN 50 MG PO CAPS: 50.0000 mg | ORAL_CAPSULE | Freq: Three times a day (TID) | ORAL | Status: DC

## 2015-09-23 MED ORDER — VITAMIN B-1 100 MG PO TABS
50.0000 mg | ORAL_TABLET | Freq: Every day | ORAL | Status: DC
  Administered 2015-09-23 – 2015-09-27 (×5): 50 mg via ORAL
  Filled 2015-09-23 (×5): qty 1

## 2015-09-23 MED ORDER — HYDROCODONE-ACETAMINOPHEN 5-325 MG PO TABS
1.0000 | ORAL_TABLET | ORAL | Status: DC
  Administered 2015-09-23 (×2): 1 via ORAL
  Administered 2015-09-23 – 2015-09-24 (×3): 2 via ORAL
  Filled 2015-09-23 (×2): qty 1
  Filled 2015-09-23 (×3): qty 2

## 2015-09-23 MED ORDER — ONDANSETRON HCL 4 MG PO TABS: 4.0000 mg | ORAL_TABLET | Freq: Three times a day (TID) | ORAL | Status: DC | PRN

## 2015-09-23 MED ORDER — SODIUM CHLORIDE 0.9 % IV BOLUS (SEPSIS)
1000.0000 mL | Freq: Once | INTRAVENOUS | Status: AC
  Administered 2015-09-23: 1000 mL via INTRAVENOUS

## 2015-09-23 MED ORDER — HYDROMORPHONE HCL 1 MG/ML IJ SOLN
0.5000 mg | INTRAMUSCULAR | Status: DC | PRN
  Administered 2015-09-23 – 2015-09-24 (×4): 0.5 mg via INTRAVENOUS
  Filled 2015-09-23 (×3): qty 1

## 2015-09-23 MED ORDER — CLONAZEPAM 0.5 MG PO TABS
0.5000 mg | ORAL_TABLET | Freq: Two times a day (BID) | ORAL | Status: DC | PRN
  Administered 2015-09-23 – 2015-09-27 (×6): 0.5 mg via ORAL
  Filled 2015-09-23 (×6): qty 1

## 2015-09-23 MED ORDER — ONDANSETRON HCL 4 MG/2ML IJ SOLN
4.0000 mg | Freq: Once | INTRAMUSCULAR | Status: AC
  Administered 2015-09-23: 4 mg via INTRAVENOUS
  Filled 2015-09-23: qty 2

## 2015-09-23 MED ORDER — HYDROCORTISONE NA SUCCINATE PF 100 MG IJ SOLR
200.0000 mg | Freq: Once | INTRAMUSCULAR | Status: AC
  Administered 2015-09-23: 200 mg via INTRAVENOUS
  Filled 2015-09-23: qty 4

## 2015-09-23 MED ORDER — VITAMIN D (ERGOCALCIFEROL) 1.25 MG (50000 UNIT) PO CAPS
50000.0000 [IU] | ORAL_CAPSULE | ORAL | Status: DC
  Administered 2015-09-23: 50000 [IU] via ORAL
  Filled 2015-09-23: qty 1

## 2015-09-23 NOTE — ED Notes (Signed)
MD at bedside. 

## 2015-09-23 NOTE — ED Notes (Signed)
Called 6N at [facility], Rn unavailable to take report at this time. Awaiting a call back.

## 2015-09-23 NOTE — ED Notes (Signed)
CT will take pt for scans in about hour since pt has received Benadryl and Steroids.

## 2015-09-23 NOTE — ED Notes (Signed)
Patient transported back from CT 

## 2015-09-23 NOTE — ED Provider Notes (Signed)
Sign out received from [employer]. Pt is an 44 y.o. female with history of c. Diff and neuropathy who presents for inability to stand or bear weight. Pt had been admitted to the hospital in November and discharged with a walker. She had reportedly been able to walk with her walker but for the past week she has been unable to stand or bear weight, particularly in her left ankle. She has been relying on her husband to carry her around the house. She has had several episodes of bowel/bladder incontinence when her husband has been unable or unavailable to help her and she has been stuck on her floor. Ankle XR shows mildly displaced fracture of the tibial plafond, intra-articular at the lateral mortise. Pt and her husband feel unsafe going home as pt is unable to take care of herself, cannot bear weight, cannot move around.  On my evaluation pt is moaning in pain and at times tearful. She has not been given any pain control at this time. She states her left foot and ankle hurt the most. Denies knee pain. She is tender diffusely to her left ankle. Small bruise on her knee but no tenderness and FROM. I spoke to ortho regarding pt's fracture. Will NPO and order CT of her ankle, ortho will come evaluate. The rest of her imaging is negative. Pt has been persistently tachycardic in the ED. Given recent hospitalization and essentially bedbound status at home, will get CTA PE study. Given pt's degree of pain, co-morbidities, will likely admit to medicine.   CT PE negative. Pt still in intractable pain. Spoke to hospitalist, they would like pt to be admitted to family medicine as she was recently discharged and had outpatient follow up. Spoke to family medicine, will admit to [facility].  Anne Ng, PA-C 09/24/15 [place], MD 09/30/15 [phone]
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
## 2015-09-23 NOTE — ED Notes (Signed)
Patient called EMS because she had fell tonight and had an contusion on left ankle and left knee. Patient denies any lost of consciousness.
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
## 2015-09-23 NOTE — ED Notes (Signed)
Bed: [id number] Expected date: 09/23/15 Expected time: 4:27 AM Means of arrival: Ambulance Comments: 44 yo F  Fall

## 2015-09-24 DIAGNOSIS — R52 Pain, unspecified: Secondary | ICD-10-CM

## 2015-09-24 DIAGNOSIS — G629 Polyneuropathy, unspecified: Secondary | ICD-10-CM

## 2015-09-24 DIAGNOSIS — R531 Weakness: Secondary | ICD-10-CM

## 2015-09-24 LAB — BASIC METABOLIC PANEL
Anion gap: 9 (ref 5–15)
BUN: 6 mg/dL (ref 6–20)
CO2: 23 mmol/L (ref 22–32)
Calcium: 9 mg/dL (ref 8.9–10.3)
Chloride: 109 mmol/L (ref 101–111)
Creatinine, Ser: 0.88 mg/dL (ref 0.44–1.00)
GFR calc Af Amer: >60 mL/min (ref >60)
GLUCOSE: 99 mg/dL (ref 65–99)
POTASSIUM: 3.5 mmol/L (ref 3.5–5.1)
Sodium: 141 mmol/L (ref 135–145)

## 2015-09-24 LAB — PREGNANCY, URINE: Preg Test, Ur: NEGATIVE

## 2015-09-24 MED ORDER — LORAZEPAM 1 MG PO TABS
2.0000 mg | ORAL_TABLET | Freq: Once | ORAL | Status: AC | PRN
  Administered 2015-09-25: 2 mg via ORAL
  Filled 2015-09-24: qty 2

## 2015-09-24 MED ORDER — HYDROMORPHONE HCL 1 MG/ML IJ SOLN
1.0000 mg | Freq: Once | INTRAMUSCULAR | Status: AC
  Administered 2015-09-24: 1 mg via INTRAVENOUS
  Filled 2015-09-24: qty 1

## 2015-09-24 MED ORDER — AMITRIPTYLINE HCL 25 MG PO TABS
25.0000 mg | ORAL_TABLET | Freq: Every day | ORAL | Status: DC
  Administered 2015-09-24 – 2015-09-26 (×3): 25 mg via ORAL
  Filled 2015-09-24 (×3): qty 1

## 2015-09-24 MED ORDER — KETOROLAC TROMETHAMINE 30 MG/ML IJ SOLN
30.0000 mg | Freq: Four times a day (QID) | INTRAMUSCULAR | Status: DC
  Administered 2015-09-24 – 2015-09-27 (×12): 30 mg via INTRAVENOUS
  Filled 2015-09-24 (×12): qty 1

## 2015-09-24 MED ORDER — BOOST / RESOURCE BREEZE PO LIQD
1.0000 | ORAL | Status: DC
  Administered 2015-09-24 – 2015-09-27 (×4): 1 via ORAL

## 2015-09-24 MED ORDER — IBUPROFEN 400 MG PO TABS
400.0000 mg | ORAL_TABLET | ORAL | Status: DC | PRN
  Administered 2015-09-24: 400 mg via ORAL
  Filled 2015-09-24: qty 1

## 2015-09-24 MED ORDER — HYDROCODONE-ACETAMINOPHEN 5-325 MG PO TABS
1.0000 | ORAL_TABLET | ORAL | Status: DC
  Administered 2015-09-24 (×2): 1 via ORAL
  Filled 2015-09-24 (×2): qty 1

## 2015-09-24 MED ORDER — LORAZEPAM 0.5 MG PO TABS
0.5000 mg | ORAL_TABLET | Freq: Once | ORAL | Status: AC | PRN
  Administered 2015-09-24: 0.5 mg via ORAL
  Filled 2015-09-24: qty 1

## 2015-09-24 MED ORDER — DIPHENHYDRAMINE HCL 25 MG PO CAPS
50.0000 mg | ORAL_CAPSULE | Freq: Once | ORAL | Status: DC
Start: 2015-09-24 — End: 2015-09-25
  Filled 2015-09-24: qty 2

## 2015-09-24 MED ORDER — HYDROMORPHONE HCL 1 MG/ML IJ SOLN
1.0000 mg | INTRAMUSCULAR | Status: DC | PRN
  Administered 2015-09-24 – 2015-09-25 (×4): 1 mg via INTRAVENOUS
  Filled 2015-09-24 (×4): qty 1

## 2015-09-24 NOTE — Progress Notes (Signed)
Patient in and out cath. Urine output 8104mL.

## 2015-09-24 NOTE — Care Management Note (Signed)
Case Management Note  Patient Details  Name: Jamie [facility] MRN: [id number] Date of Birth: 10/13/1971  Subjective/Objective:                    Action/Plan:  Will continue to follow  Expected Discharge Date:                  Expected Discharge Plan:  Skilled Nursing Facility  In-House Referral:  Clinical Social Work  Discharge planning Services     Post Acute Care Choice:    Choice offered to:     DME Arranged:    DME Agency:     HH Arranged:    [place] Agency:     Status of Service:  In process, will continue to follow  Medicare Important Message Given:    Date Medicare IM Given:    Medicare IM give by:    Date Additional Medicare IM Given:    Additional Medicare Important Message give by:     If discussed at [place] of Stay Meetings, dates discussed:    Additional Comments:  Marilu Favre, RN 09/24/2015, 1:00 PM

## 2015-09-24 NOTE — Progress Notes (Signed)
Initial Nutrition Assessment  DOCUMENTATION CODES:   Not applicable  INTERVENTION:   Boost Breeze po daily, each supplement provides 250 kcal and 9 grams of protein  NUTRITION DIAGNOSIS:   Predicted suboptimal nutrient intake related to nausea as evidenced by per patient/family report.  GOAL:   Patient will meet greater than or equal to 90% of their needs  MONITOR:   PO intake, Supplement acceptance, Labs, Weight trends, Skin, I & O's  REASON FOR ASSESSMENT:   Malnutrition Screening Tool    ASSESSMENT:   Jamie Allen is a 44 y.o. female presenting with ankle fracture. PMH is significant for HTN, diarrhea-predominant IBS, NASH, GERD, colitis, PTSD, gallbladder disease and history of C diff.   Pt admitted with ankle lt fracture.   Pt familiar to this RD from previous admission 2 weeks ago. Pt was very drowsy at time of visit and unable to provide hx.   Spoke with sitter at bedside, who reports that pt ate a good breakfast today. Per previous note, pt consumes 1-2 meals daily at home.   Nutrition-focused physical exam deferred today due to pain. Exam completed on 09/10/15, which revealed no fat depletion, mild muscle depletion, and no edema. Muscle depletion was found only in the lower extremities, likely as a result of peripheral neuropathy and inactivity. RD suspects no changes to exam at this time.   UBW is around 120#, however, wt does fluctuate greatly at baseline.   RNCM following for discharge needs.   Labs reviewed.   Diet Order:  Diet Heart Room service appropriate?: Yes; Fluid consistency:: Thin  Skin:  Reviewed, no issues  Last BM:  09/22/15  Height:   Ht Readings from Last 1 Encounters:  09/23/15 5\' 2"  (1.575 m)    Weight:   Wt Readings from Last 1 Encounters:  09/23/15 115 lb (52.164 kg)    Ideal Body Weight:  50 kg  BMI:  Body mass index is 21.03 kg/(m^2).  Estimated Nutritional Needs:   Kcal:  1400-1600  Protein:  55-70  grams  Fluid:  1.4-1.6 L  EDUCATION NEEDS:   No education needs identified at this time  Kamari Buch A. Jimmye Norman, RD, LDN, CDE Pager: [phone] After hours Pager: [phone]

## 2015-09-24 NOTE — Discharge Summary (Signed)
[facility] Discharge Summary  Patient name: Jamie [facility] Medical record number: [id number] Date of birth: Nov 20, 1970 Age: 44 y.o. Gender: female Date of Admission: 09/23/2015  Date of Discharge: 09/27/2015 Admitting Physician: Jamie Dawn, MD  Primary Care Provider: Minerva Ends, MD Consultants: Orthopedics   Indication for Hospitalization: Ankle Fracture -- Pain Control   Discharge Diagnoses/Problem List:  Patient Active Problem List   Diagnosis Date Noted  . Ankle fracture, left   . Neuropathy ([place])   . Weakness   . Intractable pain 09/23/2015  . Ankle fracture 09/23/2015  . Hepatic cirrhosis ([place])   . Vitamin D deficiency 08/28/2015  . Neuropathic pain, leg, bilateral 08/27/2015  . IBS (irritable bowel syndrome) 08/27/2015  . S/P alcohol detoxification 06/11/2014  . Alcohol dependence ([place]) 06/09/2014  . Nonalcoholic steatohepatitis (NASH) 05/30/2014  . Unspecified constipation 05/30/2014  . Other and unspecified ovarian cyst 05/30/2014  . Anxiety and depression 04/19/2014  . Hypokalemia 04/19/2014  . Persistent vomiting 02/02/2014  . Elevated liver enzymes 02/02/2014  . C. difficile diarrhea 02/02/2014  . NASH (nonalcoholic steatohepatitis) 12/16/2013  . Colitis 12/15/2013  . 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 Diarrhea 07/21/2013  . Nausea with vomiting 07/21/2013  . Abdominal pain, right upper quadrant 07/07/2013    Disposition: Home with [facility] PT  Discharge Condition: Stable   Discharge Exam:  General: restless female lying in bed  Cardiovascular: RRR. No murmurs.  Respiratory: CTAB. Normal WOB. No crackles or wheezes.  Abdomen: soft, NTND  MSK: Boot and wrap placed on left foot/ankle/LE. Able to wiggle toes of L foot. Strength 4/5 in R LE and 4/5 in upper extremities bilaterally.  Neuro: CN II-XIII grossly intact.   Brief Allen Course:  Jamie Allen is 44 y.o. female who  presented to ED on 12/4 with ankle pain s/p fall. Denied LOC or hitting of head. CT showed a left intra-articular ankle fracture. Patient has a history of known neuropathy in her lower extremities bilaterally. Orthopedics saw the patient and placed ankle in boot. Recommended follow up with Dr. Marlou Allen in 2 weeks. After receiving Dilaudid 1 mg x3, patient was still in extreme pain and was admitted for pain control. Patient was continued on IV dilaudid prn coupled with scheduled Toradol. She was then transitioned to PO dilaudid, which she tolerated well.   Patient reported UE and LE tingling, burning, and loss of sensation for past few months. Was found to have decreased folate in Nov 2016 at previous hospitalization and started on supplement. B12 and TSH were WNL at this time and HIV was non-reactive. Patient had not had RPR since 2008 so this was ordered and found to be non-reactive. Due to symptoms in all extremities, MRI of the head and C spine was ordered to evaluate for other causes of neuropathy such as MS. No evidence of demyelinating disease was present on imaging. Patient's dosage of gabapentin was increased and she was also started on amitriptyline for neuropathic pain/anxiety.   She was discharged with Vicodin for pain control. Unfortunately, due to patient's financial situation she was unable to achieve SNF placement, which the [facility] team felt like would have been best for her long term medically. We were able to send her home with home health PT.   Issues for Follow Up:  1. Orthopedics to follow up regarding ankle fracture  2. Would recommend imaging of her thoracic spine to r/o demyelinating disease in that area of her spine  causing neuropathic symptoms. Patient had recent lumbar MRI from Oct 2016 that was negative for demyelinating disease.  3. Consider increasing her amitriptyline dose.  4. Patient has had low Vit D levels in the past and has been taking a Vit D supplement. Would  recheck Vit D level to follow up as possible etiology of her neuropathic pain.  5. Patient was on [place] at time of Allen admission. This was continued and K remained stable. She does not appear to be taking an potassium wasting drugs so would recommend re-evaluation of necessity of this supplement.  6. [place] and ordered wheelchair and 3 in 1 bedside comode per PT/OT recs.   Significant Procedures: None   Significant Labs and Imaging:   Recent Labs Lab 09/23/15 0559  WBC 7.2  HGB 13.0  HCT 39.0  PLT 435*    Recent Labs Lab 09/23/15 0559 09/24/15 0441 09/25/15 1200  NA 140 141 141  K 4.0 3.5 3.7  CL 105 109 106  CO2 25 23 24   GLUCOSE 117* 99 154*  BUN <5* 6 <5*  CREATININE 0.88 0.88 0.83  CALCIUM 10.5* 9.0 10.0  ALKPHOS 154*  --   --   AST 44*  --   --   ALT 40  --   --   ALBUMIN 3.8  --   --     Results/Tests Pending at Time of Discharge: None   Discharge Medications:    Medication List    STOP taking these medications        pregabalin 50 MG capsule  Commonly known as:  LYRICA     traMADol 50 MG tablet  Commonly known as:  ULTRAM      TAKE these medications        amitriptyline 25 MG tablet  Commonly known as:  ELAVIL  Take 1 tablet (25 mg total) by mouth at bedtime.     clonazePAM 0.5 MG tablet  Commonly known as:  KLONOPIN  Take 1 tablet (0.5 mg total) by mouth 2 (two) times daily as needed (neuropathy).     dicyclomine 10 MG capsule  Commonly known as:  BENTYL  Take 10 mg by mouth 4 (four) times daily.     esomeprazole 40 MG capsule  Commonly known as:  NEXIUM  Take 1 capsule (40 mg total) by mouth daily. For acid reflux     folic acid 1 MG tablet  Commonly known as:  FOLVITE  Take 1 tablet (1 mg total) by mouth daily.     gabapentin 300 MG capsule  Commonly known as:  NEURONTIN  Take 3 capsules (900 mg total) by mouth 3 (three) times daily.     gabapentin 300 MG capsule  Commonly known as:  NEURONTIN  Take 3  capsules (900 mg total) by mouth 3 (three) times daily.     HYDROcodone-acetaminophen 7.5-500 MG tablet  Commonly known as:  LORTAB  Take 1 tablet by mouth every 6 (six) hours as needed for pain.     ibuprofen 200 MG tablet  Commonly known as:  ADVIL,MOTRIN  Take 800 mg by mouth every 6 (six) hours as needed for moderate pain.     multivitamin tablet  Take 1 tablet by mouth daily.     ondansetron 4 MG tablet  Commonly known as:  ZOFRAN  Take 1 tablet (4 mg total) by mouth every 8 (eight) hours as needed for nausea or vomiting.     potassium chloride Allen 20 MEQ  tablet  Commonly known as:  K-DUR,KLOR-CON  Take 2 tablets (40 mEq total) by mouth daily.     thiamine 50 MG tablet  Commonly known as:  VITAMIN B-1  Take 1 tablet (50 mg total) by mouth daily.     Vitamin D (Ergocalciferol) 50000 UNITS Caps capsule  Commonly known as:  DRISDOL  Take 1 capsule (50,000 Units total) by mouth every 7 (seven) days. For 8 weeks        Discharge Instructions: Please refer to Patient Instructions section of EMR for full details.  Patient was counseled important signs and symptoms that should prompt return to medical care, changes in medications, dietary instructions, activity restrictions, and follow up appointments.   Follow-Up Appointments: Follow-up Information    Follow up with Jamie Ends, MD. Schedule an appointment as soon as possible for a visit in 1 week.   Specialty:  Family Medicine   Why:  For Allen Followup   Contact information:   [place] [address] [phone]       Follow up with Meredith Pel, MD. Schedule an appointment as soon as possible for a visit on 10/08/2015.   Specialty:  Orthopedic Surgery   Why:  For Allen Followup for ankle fracture    Contact information:   [place] [address] [phone]       Adyn Serna Lauren Melvyn Hommes, DO 09/27/2015, 4:42 PM PGY-1, [place]

## 2015-09-24 NOTE — Care Management Note (Signed)
Case Management Note  Patient Details  Name: Jamie [facility] MRN: [id number] Date of Birth: [birth year]  Subjective/Objective:                    Action/Plan:  Consult for home health needs , patient is listed as uninsured , patient has a non quailfying Medicaid DX for home health PT .   Will await equipment recommendations. Expected Discharge Date:                  Expected Discharge Plan:  Home/Self Care  In-House Referral:     Discharge planning Services     Post Acute Care Choice:    Choice offered to:     DME Arranged:    DME Agency:     HH Arranged:    HH Agency:     Status of Service:  In process, will continue to follow  Medicare Important Message Given:    Date Medicare IM Given:    Medicare IM give by:    Date Additional Medicare IM Given:    Additional Medicare Important Message give by:     If discussed at [place] of Stay Meetings, dates discussed:    Additional Comments:  Marilu Favre, RN 09/24/2015, 8:02 AM

## 2015-09-24 NOTE — Progress Notes (Signed)
Pt's test on 09/11/15 was + for C.diff ANTIGEN per policy -should be on precautions for 30 days due to possibility of shedding spores.  Luther Hearing RN BSN nfection Prevention

## 2015-09-24 NOTE — Evaluation (Signed)
Physical Therapy Evaluation [name] MRN: [id number] DOB: Jun 17, 1971 Today's Date: 09/24/2015   History of Present Illness  Pt admitted with left ankle fx after fall. PMH is significant for HTN, diarrhea-predominant IBS, NASH, GERD, colitis, PTSD, gallbladder disease and history of C diff  Clinical Impression  Pt had difficult time communicating and focusing attention due to pain in L ankle. She was able to calm down briefly at times when diverting her attention away from her L LE. Pt presents with impaired standing balance and decreased safety during transfers and ambulation. Education provided on HEP, transferring only with staff, precautions, and use of DME. She would benefit from acute PT to improve safety with functional mobility and improve activity tolerance in order to decrease burden of care upon returning home. D/C to SNF for additional rehab prior to returning home is most appropriate at this time, pt verbally agreed with POC.     Follow Up Recommendations SNF    Equipment Recommendations       Recommendations for Other Services       Precautions / Restrictions Precautions Precautions: Fall      Mobility  Bed Mobility Overal bed mobility: Modified Independent             General bed mobility comments: increased time  Transfers Overall transfer level: Needs assistance   Transfers: Sit to/from Stand Sit to Stand: Min guard         General transfer comment: Took two trials to fully stand. Mod VC for hand placement and sequencing. Max VC/tactile cues to return to bed with appropriate sequence  Ambulation/Gait Ambulation/Gait assistance: Min assist Ambulation Distance (Feet): 25 Feet Assistive device: Rolling walker (2 wheeled) Gait Pattern/deviations: Step-to pattern   Gait velocity interpretation: Below normal speed for age/gender General Gait Details: Needed VC to keep walker closeby. Decreased awareness of safety.    Stairs            Wheelchair Mobility    Modified Rankin (Stroke Patients Only)       Balance Overall balance assessment: Needs assistance Sitting-balance support: No upper extremity supported;Feet supported Sitting balance-Leahy Scale: Good       Standing balance-Leahy Scale: Fair Standing balance comment: Required RW during movement                             Pertinent Vitals/Pain Pain Assessment: 0-10 Pain Score: 10-Worst pain ever Pain Location: left ankle Pain Descriptors / Indicators: Aching Pain Intervention(s): Limited activity within patient's tolerance;Monitored during session;Repositioned;Premedicated before session    Home Living Family/patient expects to be discharged to:: Private residence Living Arrangements: Spouse/significant other Available Help at Discharge: Family;Available PRN/intermittently Type of Home: Apartment Home Access: Stairs to enter   Entrance Stairs-Number of Steps: 14 Home Layout: One level Home Equipment: Walker - 2 wheels      Prior Function Level of Independence: Independent         Comments: at least 5 falls in the last year     Hand Dominance        Extremity/Trunk Assessment   Upper Extremity Assessment: Overall WFL for tasks assessed           Lower Extremity Assessment: Generalized weakness         Communication   Communication: No difficulties  Cognition Arousal/Alertness: Awake/alert Behavior During Therapy: Flat affect;Anxious Overall Cognitive Status: Impaired/Different from baseline Area of Impairment: Memory;Safety/judgement     Memory: Decreased  short-term memory   Safety/Judgement: Decreased awareness of deficits;Decreased awareness of safety          General Comments      Exercises General Exercises - Lower Extremity Long Arc Quad: AROM;Seated;Both;10 reps Heel Slides: AROM;Supine;10 reps;Left Straight Leg Raises: AROM;Supine;10 reps;Both Hip  Flexion/Marching: AROM;Seated;Both;10 reps      Assessment/Plan    PT Assessment Patient needs continued PT services  PT Diagnosis Acute pain;Difficulty walking   PT Problem List Decreased balance;Decreased mobility;Decreased activity tolerance;Pain;Decreased safety awareness  PT Treatment Interventions DME instruction;Gait training;Balance training;Patient/family education;Therapeutic activities;Functional mobility training;Stair training   PT Goals (Current goals can be found in the Care Plan section) Acute Rehab PT Goals Patient Stated Goal: return home with husband PT Goal Formulation: With patient Time For Goal Achievement: 10/01/15 Potential to Achieve Goals: Good    Frequency Min 3X/week   Barriers to discharge        Co-evaluation               End of Session Equipment Utilized During Treatment: Gait belt Activity Tolerance: Patient limited by pain Patient left: in bed;with nursing/sitter in room;with call bell/phone within reach;with bed alarm set Nurse Communication: Mobility status         Time: 1147-1209 PT Time Calculation (min) (ACUTE ONLY): 22 min   Charges:   PT Evaluation $Initial PT Evaluation Tier I: 1 Procedure     PT G CodesHaynes Bast 2015/09/25, 12:29 PM Haynes Bast, SPT 09-25-2015 12:29 PM

## 2015-09-24 NOTE — Progress Notes (Signed)
Family Medicine Teaching Service Daily Progress Note Intern Pager: [phone]  Patient name: Jamie Allen Medical record number: [id number] Date of birth: 1971-07-15 Age: 44 y.o. Gender: female  Primary Care Provider: Minerva Ends, MD Consultants: Orthopedics Code Status: Full   Pt Overview and Major Events to Date:  12/4: Patient admitted for pain control for ankle fracture   Assessment and Plan: Jamie Allen is a 44 y.o. female presenting with ankle fracture. PMH is significant for HTN, diarrhea-predominant IBS, NASH, GERD, colitis, PTSD, gallbladder disease and history of C diff.   # Ankle Fracture: CT showing a left intra-articular fracture. Reports that she fell on her way to the bath room on 12/4. Denies any loss of consciousness or hitting her head. Fracture boot has been placed by Orthopedics and recommended follow up in two weeks per Dr. Marlou Sa. Pain seems to be out of proportion to exam.  - Toradol 60 mg IM  - norco 5 mg Q4 1-2 tablets: has received 8 tablets  - dilaudid 0.5 mg IV Q4 PRN: did not receive any overnight -pain regimen: will discontinue dilaudid, change norco to 1 tab q4, and add ibuprofen prn  - PT evaluation -SW consult because husband works 2 jobs and is in school and unable to provide care at home   #Tachycardia: CTA negative for PE. Most likely related to pain. Has dry mucous membranes so possible for mild dehydration.  - management as above  - NS 100 mL/hr   #Decreased LE and UE sensation. Has known neuropathy. Hgb A1c 4.9 on 08/27/15. Negative HIV from 08/2015. TSH and B12  WNL 08/2015. Folate low to 3.7 in 08/2015.  limitation - Continue home gabapentin, lyrica, klonopin  - continue B12 and folate  -Ordered MRI brain and C spine to r/o MS, other demyelinating diseases -ordered RPR, non-reactive in 2008   #IBS: had scheduled appt with [facility] on December 14  - continue Bentyl   FEN/GI: NS 125 ml/hr,  regular diet  Prophylaxis: heparin sub Q   Disposition: ?SNF placement   Subjective:  Patient complains of extreme ankle pain and that she can't get comfortable. Reports LE and UE loss of sensation with burning and tingling for many months. Says she falls all of the time and that it is very embarrassing. Reports twitching eyelids while TV but denies blurry vision.   Objective: Temp:  [98.7 F (37.1 C)-98.9 F (37.2 C)] 98.7 F (37.1 C) (12/04 2103) Pulse Rate:  [100-114] 111 (12/04 2103) Resp:  [18-24] 24 (12/04 2103) BP: (145-161)/(68-104) 149/93 mmHg (12/04 2103) SpO2:  [88 %-96 %] 92 % (12/04 2103) Physical Exam: General: restless female sitting up in bed, crying on and off  Cardiovascular: Mildly tachycardic, normal rhythm. No murmurs.  Respiratory: CTAB. Normal WOB. No crackles or wheezes.  Abdomen: soft, NTND  MSK: Full ROM. Boot and wrap placed on left foot/ankle/LE.  Neuro: Strength 4/5 in UEs bilaterally and right LE. CN II-XII grossly intact except reports decreased sensation in V1-V3 bilaterally. Can wiggle toes of left foot. Sensation grossly intact in UE bilaterally and R LE.  Psych: A&Ox3. Moaning throughout exam.   Laboratory:  Recent Labs Lab 09/23/15 0559  WBC 7.2  HGB 13.0  HCT 39.0  PLT 435*    Recent Labs Lab 09/23/15 0559 09/24/15 0441  NA 140 141  K 4.0 3.5  CL 105 109  CO2 25 23  BUN <5* 6  CREATININE 0.88 0.88  CALCIUM 10.5* 9.0  PROT 8.5*  --  BILITOT 0.7  --   ALKPHOS 154*  --   ALT 40  --   AST 44*  --   GLUCOSE 117* 99    Imaging/Diagnostic Tests: Dg Tibia/fibula Left  09/23/2015  CLINICAL DATA:  Fall, pain. EXAM: LEFT TIBIA AND FIBULA - 2 VIEW COMPARISON:  None. FINDINGS: There is no evidence of fracture or other focal bone lesions. Soft tissues are unremarkable. Tibial plafond fracture, with intra-articular extension, at the lateral mortise is only seen on the separate plain films of the left ankle. IMPRESSION: Negative. Tibial  plafond fracture, with intra-articular extension, at the lateral mortise is only seen on the separate plain films of the left ankle. Electronically Signed   By: Franki Cabot M.D.   On: 09/23/2015 08:28   Dg Ankle Complete Left  09/23/2015  CLINICAL DATA:  [place] tonight. EXAM: LEFT ANKLE COMPLETE - 3+ VIEW COMPARISON:  None. FINDINGS: There is a mildly displaced fracture of the tibial plafond, intra-articular at the lateral mortise. No other acute fracture is evident. IMPRESSION: Tibial plafond fracture. Electronically Signed   By: Andreas Newport M.D.   On: 09/23/2015 06:41   Ct Angio Chest Pe W/cm &/or Wo Cm  09/23/2015  CLINICAL DATA:  Left tibial fracture with tachycardia and shortness of breath. EXAM: CT ANGIOGRAPHY CHEST WITH CONTRAST TECHNIQUE: Multidetector CT imaging of the chest was performed using the standard protocol during bolus administration of intravenous contrast. Multiplanar CT image reconstructions and MIPs were obtained to evaluate the vascular anatomy. CONTRAST:  168m OMNIPAQUE IOHEXOL 350 MG/ML SOLN COMPARISON:  Chest x-ray on 06/24/2015 FINDINGS: The pulmonary arteries are well opacified. There is no evidence of pulmonary embolism. The thoracic aorta is also well opacified and shows normal caliber and patency. Proximal great vessels are normally patent. Lungs show bibasilar atelectasis. No edema or airspace consolidation identified. No evidence of pneumothorax or pleural fluid. The heart size is normal. No pericardial fluid identified. No evidence of pulmonary nodules or enlarged lymph nodes. Visualized upper abdomen is unremarkable. No fractures are identified. Review of the MIP images confirms the above findings. IMPRESSION: No evidence of pulmonary embolism or other acute findings. Bibasilar atelectasis present. Electronically Signed   By: GAletta EdouardM.D.   On: 09/23/2015 10:01   Ct Ankle Left Wo Contrast  09/23/2015  CLINICAL DATA:  Status post fall. Evaluate fracture of  the tibial plafond. EXAM: CT OF THE LEFT ANKLE WITHOUT CONTRAST TECHNIQUE: Multidetector CT imaging of the left ankle was performed according to the standard protocol. Multiplanar CT image reconstructions were also generated. COMPARISON:  Ankle radiographs 09/23/2015. FINDINGS: Exam quality suboptimal, in part secondary to motion. CT confirms the presence of a minimally displaced fracture of the tibial plafond anterolaterally adjacent to the distal fibula. This is likely mediated by the anterior inferior tibiofibular ligament. Defect in the articular surface measures less than 2 mm. No other acute fractures are identified. There is probable mild spurring of the fibular tip. The talar dome appears intact. There is no widening of the ankle mortise or dislocation. The subtalar joint and visualized tarsal bones appear normal. As evaluated by CT, the ankle tendons appear intact without entrapment. There is soft tissue swelling both medially and laterally. IMPRESSION: 1. Intra-articular fracture of the tibial plafond anterolaterally with minimal displacement. 2. No other fractures or acute osseous findings. Electronically Signed   By: WRichardean SaleM.D.   On: 09/23/2015 09:57   Dg Knee Complete 4 Views Left  09/23/2015  CLINICAL DATA:  Fall yesterday,  pain. EXAM: LEFT KNEE - COMPLETE 4+ VIEW COMPARISON:  None. FINDINGS: There is no evidence of fracture, dislocation, or joint effusion. There is no evidence of arthropathy or other focal bone abnormality. Soft tissues are unremarkable. IMPRESSION: Negative. Electronically Signed   By: Franki Cabot M.D.   On: 09/23/2015 08:25    Nicolette Bang, DO 09/24/2015, 6:34 AM PGY-1, [place] Intern pager: [phone], text pages welcome

## 2015-09-25 ENCOUNTER — Inpatient Hospital Stay (HOSPITAL_COMMUNITY): Payer: Self-pay

## 2015-09-25 DIAGNOSIS — S82892A Other fracture of left lower leg, initial encounter for closed fracture: Secondary | ICD-10-CM | POA: Insufficient documentation

## 2015-09-25 LAB — BASIC METABOLIC PANEL
Anion gap: 11 (ref 5–15)
CO2: 24 mmol/L (ref 22–32)
Calcium: 10 mg/dL (ref 8.9–10.3)
Chloride: 106 mmol/L (ref 101–111)
Creatinine, Ser: 0.83 mg/dL (ref 0.44–1.00)
GFR calc Af Amer: >60 mL/min (ref >60)
GFR calc non Af Amer: >60 mL/min (ref >60)
GLUCOSE: 154 mg/dL — AB (ref 65–99)
POTASSIUM: 3.7 mmol/L (ref 3.5–5.1)
SODIUM: 141 mmol/L (ref 135–145)

## 2015-09-25 LAB — RPR: RPR: NONREACTIVE

## 2015-09-25 MED ORDER — DIPHENHYDRAMINE HCL 50 MG/ML IJ SOLN
50.0000 mg | Freq: Once | INTRAMUSCULAR | Status: AC
Start: 2015-09-25 — End: 2015-09-25
  Administered 2015-09-25: 50 mg via INTRAVENOUS
  Filled 2015-09-25: qty 1

## 2015-09-25 MED ORDER — NICOTINE 14 MG/24HR TD PT24
14.0000 mg | MEDICATED_PATCH | Freq: Every day | TRANSDERMAL | Status: DC
  Administered 2015-09-25 – 2015-09-27 (×3): 14 mg via TRANSDERMAL
  Filled 2015-09-25 (×3): qty 1

## 2015-09-25 MED ORDER — HYDROMORPHONE HCL 2 MG PO TABS
1.0000 mg | ORAL_TABLET | ORAL | Status: DC | PRN
  Administered 2015-09-25: 1 mg via ORAL
  Filled 2015-09-25: qty 1

## 2015-09-25 MED ORDER — HYDROMORPHONE HCL 2 MG PO TABS
1.0000 mg | ORAL_TABLET | ORAL | Status: DC | PRN
  Administered 2015-09-25 – 2015-09-27 (×13): 1 mg via ORAL
  Filled 2015-09-25 (×13): qty 1

## 2015-09-25 NOTE — Progress Notes (Signed)
MRI scheduled for tonight. Please administer ativan and benadryl together before pt goes to IR

## 2015-09-25 NOTE — Progress Notes (Signed)
1mg  po dilaudid administered for c/o pain. Pt has been crying all day

## 2015-09-25 NOTE — Progress Notes (Signed)
1mg  prn po dilaudid administered for c/o pain

## 2015-09-25 NOTE — Progress Notes (Signed)
Pt has been crying in pain most of day despite prn pain meds administered. MD paged and notified. Pt also says she wants a nicotine patch. MD aware

## 2015-09-25 NOTE — Plan of Care (Signed)
Problem: Pain Managment: Goal: General experience of comfort will improve Outcome: Not Progressing Pt crying most of day

## 2015-09-25 NOTE — Progress Notes (Signed)
Family Medicine Teaching Service Daily Progress Note Intern Pager: [phone]  Patient name: Jamie Allen Medical record number: [id number] Date of birth: 10/07/1971 Age: 44 y.o. Gender: female  Primary Care Provider: Minerva Ends, MD Consultants: Orthopedics Code Status: Full   Pt Overview and Major Events to Date:  12/4: Patient admitted for pain control for ankle fracture   Assessment and Plan: Jamie Allen is a 44 y.o. female presenting with ankle fracture. PMH is significant for HTN, diarrhea-predominant IBS, NASH, GERD, colitis, PTSD, gallbladder disease and history of C diff.   # Ankle Fracture: CT showing a left intra-articular fracture. Reports that she fell on her way to the bath room on 12/4. Denies any loss of consciousness or hitting her head. Fracture boot has been placed by Orthopedics and recommended follow up in two weeks per Dr. Marlou Sa. Pain seems to be out of proportion to exam.  -pain regimen: Dilaudid 1 mg q2 hrs prn (received 3 mg overnight), Toradol 30 mg IV q 6 hours  -will transition to PO dilaudid 1 mg q3 hours  - PT evaluation: recommending SNF placement  -SW consult because husband works 2 jobs and is in school and unable to provide care at home   #Tachycardia, Improving: CTA negative for PE. Most likely related to pain. Has dry mucous membranes so possible for mild dehydration.  - management as above  - NS 100 mL/hr   #Decreased LE and UE sensation. Has known neuropathy. Hgb A1c 4.9 on 08/27/15. Negative HIV from 08/2015. TSH and B12  WNL 08/2015. Folate low to 3.7 in 08/2015. RPR non-reactive.  limitation - Continue home gabapentin, lyrica, klonopin  - continue B12 and folate  -Ordered MRI brain and C spine to r/o MS, other demyelinating diseases; patient unable to tolerate with 0.5 mg of Ativan ---> have ordered Ativan 2 mg and Benadryl 50 mg for sedation to re-attempt imaging  -started on Amytriptyline 25 mg on 12/5  #IBS: had  scheduled appt with [facility] on December 14  - continue Bentyl   FEN/GI: NS 125 ml/hr, regular diet  Prophylaxis: heparin sub Q   Disposition: SNF placement per PT consult   Subjective:  Patient appeared comfortable when I entered room but quickly became hysterical regarding pain control. Says she panicked when attempted to have imaging done yesterday, but is willing to attempt again today with more sedation.   Objective: Temp:  [98.3 F (36.8 C)-98.5 F (36.9 C)] 98.5 F (36.9 C) (12/06 0547) Pulse Rate:  [93-102] 93 (12/06 0547) Resp:  [18] 18 (12/06 0547) BP: (128-143)/(77-84) 128/83 mmHg (12/06 0547) SpO2:  [92 %-94 %] 92 % (12/06 0547) Physical Exam: General: restless female lying in bed  Cardiovascular: RRR. No murmurs.  Respiratory: CTAB. Normal WOB. No crackles or wheezes.  Abdomen: soft, NTND  MSK: Full ROM. Boot and wrap placed on left foot/ankle/LE.  Neuro: Strength 4/5 in UEs bilaterally and right LE.Can wiggle toes of left foot. Sensation grossly intact in UE bilaterally and R LE.  Psych: A&Ox3.   Laboratory:  Recent Labs Lab 09/23/15 0559  WBC 7.2  HGB 13.0  HCT 39.0  PLT 435*    Recent Labs Lab 09/23/15 0559 09/24/15 0441  NA 140 141  K 4.0 3.5  CL 105 109  CO2 25 23  BUN <5* 6  CREATININE 0.88 0.88  CALCIUM 10.5* 9.0  PROT 8.5*  --   BILITOT 0.7  --   ALKPHOS 154*  --   ALT  40  --   AST 44*  --   GLUCOSE 117* 99    Imaging/Diagnostic Tests: Dg Tibia/fibula Left  09/23/2015  CLINICAL DATA:  Fall, pain. EXAM: LEFT TIBIA AND FIBULA - 2 VIEW COMPARISON:  None. FINDINGS: There is no evidence of fracture or other focal bone lesions. Soft tissues are unremarkable. Tibial plafond fracture, with intra-articular extension, at the lateral mortise is only seen on the separate plain films of the left ankle. IMPRESSION: Negative. Tibial plafond fracture, with intra-articular extension, at the lateral mortise is only seen  on the separate plain films of the left ankle. Electronically Signed   By: Franki Cabot M.D.   On: 09/23/2015 08:28   Dg Ankle Complete Left  09/23/2015  CLINICAL DATA:  [place] tonight. EXAM: LEFT ANKLE COMPLETE - 3+ VIEW COMPARISON:  None. FINDINGS: There is a mildly displaced fracture of the tibial plafond, intra-articular at the lateral mortise. No other acute fracture is evident. IMPRESSION: Tibial plafond fracture. Electronically Signed   By: Andreas Newport M.D.   On: 09/23/2015 06:41   Ct Angio Chest Pe W/cm &/or Wo Cm  09/23/2015  CLINICAL DATA:  Left tibial fracture with tachycardia and shortness of breath. EXAM: CT ANGIOGRAPHY CHEST WITH CONTRAST TECHNIQUE: Multidetector CT imaging of the chest was performed using the standard protocol during bolus administration of intravenous contrast. Multiplanar CT image reconstructions and MIPs were obtained to evaluate the vascular anatomy. CONTRAST:  125m OMNIPAQUE IOHEXOL 350 MG/ML SOLN COMPARISON:  Chest x-ray on 06/24/2015 FINDINGS: The pulmonary arteries are well opacified. There is no evidence of pulmonary embolism. The thoracic aorta is also well opacified and shows normal caliber and patency. Proximal great vessels are normally patent. Lungs show bibasilar atelectasis. No edema or airspace consolidation identified. No evidence of pneumothorax or pleural fluid. The heart size is normal. No pericardial fluid identified. No evidence of pulmonary nodules or enlarged lymph nodes. Visualized upper abdomen is unremarkable. No fractures are identified. Review of the MIP images confirms the above findings. IMPRESSION: No evidence of pulmonary embolism or other acute findings. Bibasilar atelectasis present. Electronically Signed   By: GAletta EdouardM.D.   On: 09/23/2015 10:01   Ct Ankle Left Wo Contrast  09/23/2015  CLINICAL DATA:  Status post fall. Evaluate fracture of the tibial plafond. EXAM: CT OF THE LEFT ANKLE WITHOUT CONTRAST TECHNIQUE:  Multidetector CT imaging of the left ankle was performed according to the standard protocol. Multiplanar CT image reconstructions were also generated. COMPARISON:  Ankle radiographs 09/23/2015. FINDINGS: Exam quality suboptimal, in part secondary to motion. CT confirms the presence of a minimally displaced fracture of the tibial plafond anterolaterally adjacent to the distal fibula. This is likely mediated by the anterior inferior tibiofibular ligament. Defect in the articular surface measures less than 2 mm. No other acute fractures are identified. There is probable mild spurring of the fibular tip. The talar dome appears intact. There is no widening of the ankle mortise or dislocation. The subtalar joint and visualized tarsal bones appear normal. As evaluated by CT, the ankle tendons appear intact without entrapment. There is soft tissue swelling both medially and laterally. IMPRESSION: 1. Intra-articular fracture of the tibial plafond anterolaterally with minimal displacement. 2. No other fractures or acute osseous findings. Electronically Signed   By: WRichardean SaleM.D.   On: 09/23/2015 09:57   Dg Knee Complete 4 Views Left  09/23/2015  CLINICAL DATA:  Fall yesterday, pain. EXAM: LEFT KNEE - COMPLETE 4+ VIEW COMPARISON:  None. FINDINGS: There  is no evidence of fracture, dislocation, or joint effusion. There is no evidence of arthropathy or other focal bone abnormality. Soft tissues are unremarkable. IMPRESSION: Negative. Electronically Signed   By: Franki Cabot M.D.   On: 09/23/2015 08:25    Nicolette Bang, DO 09/25/2015, 7:05 AM PGY-1, [place] Intern pager: [phone], text pages welcome

## 2015-09-25 NOTE — Progress Notes (Signed)
1mg  po dilaudid administered for pain

## 2015-09-25 NOTE — Progress Notes (Signed)
Prn clonopin administered for episodes of crying

## 2015-09-25 NOTE — Progress Notes (Signed)
Pt currently calm and resting in bed with eyes closed.

## 2015-09-26 LAB — VITAMIN B1: VITAMIN B1 (THIAMINE): 10 nmol/L (ref 8–30)

## 2015-09-26 MED ORDER — LORAZEPAM 2 MG/ML IJ SOLN: 2.0000 mg | Freq: Once | INTRAMUSCULAR | Status: DC | PRN

## 2015-09-26 MED ORDER — GABAPENTIN 300 MG PO CAPS
900.0000 mg | ORAL_CAPSULE | Freq: Three times a day (TID) | ORAL | Status: DC
  Administered 2015-09-26 – 2015-09-27 (×4): 900 mg via ORAL
  Filled 2015-09-26 (×4): qty 3

## 2015-09-26 MED ORDER — DIPHENHYDRAMINE HCL 25 MG PO CAPS: 25.0000 mg | ORAL_CAPSULE | Freq: Once | ORAL | Status: DC | PRN

## 2015-09-26 MED ORDER — GADOBENATE DIMEGLUMINE 529 MG/ML IV SOLN
10.0000 mL | Freq: Once | INTRAVENOUS | Status: AC
  Administered 2015-09-26: 10 mL via INTRAVENOUS

## 2015-09-26 NOTE — Progress Notes (Signed)
Family Medicine Teaching Service Daily Progress Note Intern Pager: [phone]  Patient name: Jamie Allen Medical record number: [id number] Date of birth: Sep 07, 1971 Age: 44 y.o. Gender: female  Primary Care Provider: Minerva Ends, MD Consultants: Orthopedics Code Status: Full   Pt Overview and Major Events to Date:  12/4: Patient admitted for pain control for ankle fracture   Assessment and Plan: Jamie Allen is a 44 y.o. female presenting with ankle fracture. PMH is significant for HTN, diarrhea-predominant IBS, NASH, GERD, colitis, PTSD, gallbladder disease and history of C diff.   # Ankle Fracture: CT showing a left intra-articular fracture. Reports that she fell on her way to the bath room on 12/4. Denies any loss of consciousness or hitting her head. Fracture boot has been placed by Orthopedics and recommended follow up in two weeks per Dr. Marlou Sa. Pain seems to be out of proportion to exam.  -pain regimen: PO Dilaudid 1 mg q2 hrs prn (slept through the night without receiving any), Toradol 30 mg IV q 6 hours  -consider switching from PO dilaudid to Norco  -PT recommends SNF placement   #Tachycardia: CTA negative for PE. Most likely related to pain. Has dry mucous membranes so possible for mild dehydration.  - management as above  - NS 100 mL/hr   #Decreased LE and UE sensation. Has known neuropathy. Hgb A1c 4.9 on 08/27/15. Negative HIV from 08/2015. TSH and B12  WNL 08/2015. Folate low to 3.7 in 08/2015. RPR non-reactive. MRI cervical: posterior disc protrusion of C5-C6, mild C6-7 disc bulge. No signs of demyelinating disease on MRI brain or C spine.  limitation - Consider increasing gabapentin from current regimen of 600 mg TID  - continue B12 and folate  -started on Amytriptyline 25 mg on 12/5; could increase amtriptyline dose   #IBS: had scheduled appt with [facility] on December 14  - continue Bentyl   FEN/GI: NS 125 ml/hr,  regular diet  Prophylaxis: heparin sub Q   Disposition: discharge pending SNF placement   Subjective:  Patient resting very comfortably this morning. Received Dilaudid PO approximately 2 hours before I saw her. No complaints.   Objective: Temp:  [98.8 F (37.1 C)-99.3 F (37.4 C)] 98.8 F (37.1 C) (12/07 0604) Pulse Rate:  [100-110] 100 (12/07 0604) Resp:  [20] 20 (12/07 0604) BP: (137-162)/(91-96) 137/91 mmHg (12/07 0604) SpO2:  [93 %-96 %] 95 % (12/07 0604) Physical Exam: General: restless female lying in bed  Cardiovascular: RRR. No murmurs.  Respiratory: CTAB. Normal WOB. No crackles or wheezes.  Abdomen: soft, NTND  MSK:  Boot and wrap placed on left foot/ankle/LE.   Laboratory:  Recent Labs Lab 09/23/15 0559  WBC 7.2  HGB 13.0  HCT 39.0  PLT 435*    Recent Labs Lab 09/23/15 0559 09/24/15 0441 09/25/15 1200  NA 140 141 141  K 4.0 3.5 3.7  CL 105 109 106  CO2 25 23 24   BUN <5* 6 <5*  CREATININE 0.88 0.88 0.83  CALCIUM 10.5* 9.0 10.0  PROT 8.5*  --   --   BILITOT 0.7  --   --   ALKPHOS 154*  --   --   ALT 40  --   --   AST 44*  --   --   GLUCOSE 117* 99 154*    Imaging/Diagnostic Tests: Dg Tibia/fibula Left  09/23/2015  CLINICAL DATA:  Fall, pain. EXAM: LEFT TIBIA AND FIBULA - 2 VIEW COMPARISON:  None. FINDINGS: There is no  evidence of fracture or other focal bone lesions. Soft tissues are unremarkable. Tibial plafond fracture, with intra-articular extension, at the lateral mortise is only seen on the separate plain films of the left ankle. IMPRESSION: Negative. Tibial plafond fracture, with intra-articular extension, at the lateral mortise is only seen on the separate plain films of the left ankle. Electronically Signed   By: Franki Cabot M.D.   On: 09/23/2015 08:28   Dg Ankle Complete Left  09/23/2015  CLINICAL DATA:  [place] tonight. EXAM: LEFT ANKLE COMPLETE - 3+ VIEW COMPARISON:  None. FINDINGS: There is a mildly displaced fracture of the tibial  plafond, intra-articular at the lateral mortise. No other acute fracture is evident. IMPRESSION: Tibial plafond fracture. Electronically Signed   By: Andreas Newport M.D.   On: 09/23/2015 06:41   Ct Angio Chest Pe W/cm &/or Wo Cm  09/23/2015  CLINICAL DATA:  Left tibial fracture with tachycardia and shortness of breath. EXAM: CT ANGIOGRAPHY CHEST WITH CONTRAST TECHNIQUE: Multidetector CT imaging of the chest was performed using the standard protocol during bolus administration of intravenous contrast. Multiplanar CT image reconstructions and MIPs were obtained to evaluate the vascular anatomy. CONTRAST:  136m OMNIPAQUE IOHEXOL 350 MG/ML SOLN COMPARISON:  Chest x-ray on 06/24/2015 FINDINGS: The pulmonary arteries are well opacified. There is no evidence of pulmonary embolism. The thoracic aorta is also well opacified and shows normal caliber and patency. Proximal great vessels are normally patent. Lungs show bibasilar atelectasis. No edema or airspace consolidation identified. No evidence of pneumothorax or pleural fluid. The heart size is normal. No pericardial fluid identified. No evidence of pulmonary nodules or enlarged lymph nodes. Visualized upper abdomen is unremarkable. No fractures are identified. Review of the MIP images confirms the above findings. IMPRESSION: No evidence of pulmonary embolism or other acute findings. Bibasilar atelectasis present. Electronically Signed   By: GAletta EdouardM.D.   On: 09/23/2015 10:01   Mr BJeri CosWZOContrast  09/26/2015  CLINICAL DATA:  Initial evaluation for peripheral neuropathy of unclear etiology. EXAM: MRI HEAD WITHOUT AND WITH CONTRAST MRI CERVICAL SPINE WITHOUT AND WITH CONTRAST TECHNIQUE: Multiplanar, multiecho pulse sequences of the brain and surrounding structures, and cervical spine, to include the craniocervical junction and cervicothoracic junction, were obtained without and with intravenous contrast. CONTRAST:  10 cc of MultiHance. COMPARISON:   None. FINDINGS: MRI HEAD FINDINGS Cerebral volume within normal limits for patient age. There is minimal T2/FLAIR hyperintensity within the periventricular deep white matter both cerebral hemispheres, nonspecific, but most likely related to very mild chronic small vessel ischemic disease. No abnormal foci of restricted diffusion to suggest acute intracranial infarct. Gray-white matter differentiation maintained. Normal intravascular flow voids are preserved. No acute or chronic intracranial hemorrhage. No areas of chronic infarction. No mass lesion, midline shift, or mass effect. No hydrocephalus. No extra-axial fluid collection. No abnormal enhancement. Deep gray nuclei are normal. Craniocervical junction within normal limits. Pituitary gland normal in appearance.  Pituitary stalk is midline. No acute abnormality about the orbits. Paranasal sinuses are clear. No mastoid effusion. Inner ear structures grossly normal. Bone marrow signal intensity within normal limits. No scalp soft tissue abnormality. MRI CERVICAL SPINE FINDINGS Study is degraded by motion artifact. Vertebral bodies are normally aligned with preservation of the normal cervical lordosis. Vertebral body heights are well maintained. Signal intensity within the vertebral body bone marrow within normal limits. No fracture or listhesis. No focal osseous lesion. No marrow edema. Signal intensity within the cervical spinal cord is normal. No abnormal  enhancement. Visualized soft tissues of the neck demonstrate no acute abnormality. No prevertebral edema. C2-C3: Negative. C3-C4:  Negative. C4-C5:  Negative. C5-C6: Diffuse annular disc bulge with intervertebral disc space narrowing and endplate osteophytic spurring. There is a posterior disc protrusion which indents and partially flattens the ventral thecal sac with mild flattening of the cervical spinal cord. No associated cord signal changes. Resultant mild canal and bilateral foraminal narrowing. C6-C7:  Mild annular disc bulge without focal disc protrusion. Minimal uncovertebral spurring. No significant stenosis. C7-T1:  Negative. Visualized upper thoracic spine demonstrates no acute abnormality. IMPRESSION: MRI BRAIN IMPRESSION: 1. No acute intracranial process. 2. Minimal patchy T2/FLAIR hyperintensity within the periventricular and deep white matter both cerebral hemispheres. Finding is nonspecific, but most likely related to very mild chronic small vessel ischemic changes. This would not be characteristic of underlying demyelinating disease. MRI CERVICAL SPINE IMPRESSION: 1. Normal MRI appearance of the cervical spinal cord without evidence for cord compression or severe canal stenosis. 2. Posterior disc protrusion at C5-6 with associated mild canal narrowing, mild cord flattening, and mild bilateral foraminal stenosis. No associated cord signal changes. 3. Mild disc bulge at C6-7 without significant stenosis. Electronically Signed   By: Jeannine Boga M.D.   On: 09/26/2015 01:10   Mr Cervical Spine W Wo Contrast  09/26/2015  CLINICAL DATA:  Initial evaluation for peripheral neuropathy of unclear etiology. EXAM: MRI HEAD WITHOUT AND WITH CONTRAST MRI CERVICAL SPINE WITHOUT AND WITH CONTRAST TECHNIQUE: Multiplanar, multiecho pulse sequences of the brain and surrounding structures, and cervical spine, to include the craniocervical junction and cervicothoracic junction, were obtained without and with intravenous contrast. CONTRAST:  10 cc of MultiHance. COMPARISON:  None. FINDINGS: MRI HEAD FINDINGS Cerebral volume within normal limits for patient age. There is minimal T2/FLAIR hyperintensity within the periventricular deep white matter both cerebral hemispheres, nonspecific, but most likely related to very mild chronic small vessel ischemic disease. No abnormal foci of restricted diffusion to suggest acute intracranial infarct. Gray-white matter differentiation maintained. Normal intravascular flow voids  are preserved. No acute or chronic intracranial hemorrhage. No areas of chronic infarction. No mass lesion, midline shift, or mass effect. No hydrocephalus. No extra-axial fluid collection. No abnormal enhancement. Deep gray nuclei are normal. Craniocervical junction within normal limits. Pituitary gland normal in appearance.  Pituitary stalk is midline. No acute abnormality about the orbits. Paranasal sinuses are clear. No mastoid effusion. Inner ear structures grossly normal. Bone marrow signal intensity within normal limits. No scalp soft tissue abnormality. MRI CERVICAL SPINE FINDINGS Study is degraded by motion artifact. Vertebral bodies are normally aligned with preservation of the normal cervical lordosis. Vertebral body heights are well maintained. Signal intensity within the vertebral body bone marrow within normal limits. No fracture or listhesis. No focal osseous lesion. No marrow edema. Signal intensity within the cervical spinal cord is normal. No abnormal enhancement. Visualized soft tissues of the neck demonstrate no acute abnormality. No prevertebral edema. C2-C3: Negative. C3-C4:  Negative. C4-C5:  Negative. C5-C6: Diffuse annular disc bulge with intervertebral disc space narrowing and endplate osteophytic spurring. There is a posterior disc protrusion which indents and partially flattens the ventral thecal sac with mild flattening of the cervical spinal cord. No associated cord signal changes. Resultant mild canal and bilateral foraminal narrowing. C6-C7: Mild annular disc bulge without focal disc protrusion. Minimal uncovertebral spurring. No significant stenosis. C7-T1:  Negative. Visualized upper thoracic spine demonstrates no acute abnormality. IMPRESSION: MRI BRAIN IMPRESSION: 1. No acute intracranial process. 2. Minimal patchy T2/FLAIR hyperintensity  within the periventricular and deep white matter both cerebral hemispheres. Finding is nonspecific, but most likely related to very mild chronic  small vessel ischemic changes. This would not be characteristic of underlying demyelinating disease. MRI CERVICAL SPINE IMPRESSION: 1. Normal MRI appearance of the cervical spinal cord without evidence for cord compression or severe canal stenosis. 2. Posterior disc protrusion at C5-6 with associated mild canal narrowing, mild cord flattening, and mild bilateral foraminal stenosis. No associated cord signal changes. 3. Mild disc bulge at C6-7 without significant stenosis. Electronically Signed   By: Jeannine Boga M.D.   On: 09/26/2015 01:10   Ct Ankle Left Wo Contrast  09/23/2015  CLINICAL DATA:  Status post fall. Evaluate fracture of the tibial plafond. EXAM: CT OF THE LEFT ANKLE WITHOUT CONTRAST TECHNIQUE: Multidetector CT imaging of the left ankle was performed according to the standard protocol. Multiplanar CT image reconstructions were also generated. COMPARISON:  Ankle radiographs 09/23/2015. FINDINGS: Exam quality suboptimal, in part secondary to motion. CT confirms the presence of a minimally displaced fracture of the tibial plafond anterolaterally adjacent to the distal fibula. This is likely mediated by the anterior inferior tibiofibular ligament. Defect in the articular surface measures less than 2 mm. No other acute fractures are identified. There is probable mild spurring of the fibular tip. The talar dome appears intact. There is no widening of the ankle mortise or dislocation. The subtalar joint and visualized tarsal bones appear normal. As evaluated by CT, the ankle tendons appear intact without entrapment. There is soft tissue swelling both medially and laterally. IMPRESSION: 1. Intra-articular fracture of the tibial plafond anterolaterally with minimal displacement. 2. No other fractures or acute osseous findings. Electronically Signed   By: Richardean Sale M.D.   On: 09/23/2015 09:57   Dg Knee Complete 4 Views Left  09/23/2015  CLINICAL DATA:  Fall yesterday, pain. EXAM: LEFT KNEE -  COMPLETE 4+ VIEW COMPARISON:  None. FINDINGS: There is no evidence of fracture, dislocation, or joint effusion. There is no evidence of arthropathy or other focal bone abnormality. Soft tissues are unremarkable. IMPRESSION: Negative. Electronically Signed   By: Franki Cabot M.D.   On: 09/23/2015 08:25    Nicolette Bang, DO 09/26/2015, 9:16 AM PGY-1, [place] Intern pager: [phone], text pages welcome

## 2015-09-26 NOTE — Progress Notes (Signed)
Physical Therapy Treatment [name] MRN: [id number] DOB: 07-21-1971 Today's Date: 09/26/2015    History of Present Illness Pt admitted with left ankle fx after fall. PMH is significant for HTN, diarrhea-predominant IBS, NASH, GERD, colitis, PTSD, gallbladder disease and history of C diff    PT Comments    Still anxious with getting up, but making progress towards goals; Able to ascend/descend 10 steps with one rail on the left, leading going up with Stronger leg, leading descending with weaker leg; Seemed pleased with her achievement in session today;   I encouraged pt to talk about how she managed on the steps with husband; we also discussed the possibility that if she feels particularly weak, she can bump up the steps on her bottom; she tells me she has done this technique before  Follow Up Recommendations  Home health PT;Supervision/Assistance - 24 hour (HHOT/HHAide)     Equipment Recommendations  3in1 (PT);Wheelchair (measurements PT);Wheelchair cushion (measurements PT) A wheelchair will be potentially helpful for managing at home, but given progress, it is not an absolute must.   Recommendations for Other Services OT consult     Precautions / Restrictions Precautions Precautions: Fall Restrictions LLE Weight Bearing: Partial weight bearing LLE Partial Weight Bearing Percentage or Pounds: unspecified    Mobility  Bed Mobility Overal bed mobility: Modified Independent             General bed mobility comments: increased time  Transfers Overall transfer level: Needs assistance Equipment used: Rolling walker (2 wheeled) Transfers: Sit to/from Stand Sit to Stand: Min guard         General transfer comment: Cues for technique and hand palcement; as well as encouragement to participate  Ambulation/Gait Ambulation/Gait assistance: Min guard Ambulation Distance (Feet): 30 Feet Assistive device: Rolling walker (2 wheeled) Gait  Pattern/deviations: Step-through pattern;Decreased stride length Gait velocity: decr   General Gait Details: One loss of balance needing cues to push down into RW to maintain balance   Stairs Stairs: Yes Stairs assistance: Mod assist Stair Management: One rail Left;Step to pattern;Sideways Number of Stairs: 10 General stair comments: verbal and demo cues for technqiue; needed two rest breaks, leaning on rail; occasional R and LLE buckling, heavy dependence on UE suport on the rail  Wheelchair Mobility    Modified Rankin (Stroke Patients Only)       Balance             Standing balance-Leahy Scale: Fair                      Cognition Arousal/Alertness: Awake/alert Behavior During Therapy: WFL for tasks assessed/performed (somewhat anxious, but abl eto participate) Overall Cognitive Status: No family/caregiver present to determine baseline cognitive functioning                      Exercises      General Comments        Pertinent Vitals/Pain Pain Assessment: Faces Faces Pain Scale: Hurts even more Pain Location: L LE Pain Descriptors / Indicators: Aching;Grimacing Pain Intervention(s): Limited activity within patient's tolerance;Monitored during session;Patient requesting pain meds-RN notified;Repositioned    Home Living                      Prior Function            PT Goals (current goals can now be found in the care plan section) Acute Rehab PT Goals Patient Stated Goal:  return home with husband PT Goal Formulation: With patient Time For Goal Achievement: 10/01/15 Potential to Achieve Goals: Good Progress towards PT goals: Progressing toward goals    Frequency  Min 5X/week    PT Plan Discharge plan needs to be updated;Frequency needs to be updated (Unable to go to SNF)    Co-evaluation             End of Session           Time: [id number] PT Time Calculation (min) (ACUTE ONLY): 25 min  Charges:  $Gait  Training: 23-37 mins                    G Codes:      Jamie Allen 09/26/2015, 4:49 PM  Jamie Allen, [place] Pager [phone] Office [phone]

## 2015-09-26 NOTE — Care Management Note (Addendum)
Case Management Note  Patient Details  Name: Jamie Allen MRN: [id number] Date of Birth: September 11, 1971  Subjective/Objective:                    Action/Plan:  Per SW patient does not qualify for LOG for SNF .  Husband cannot afford private pay SNF . Patient does not have qualifying Medicaid DX for HHPT , could ask [place] if qualify for [facility] under charity .  Husband requesting wheelchair. DR Berkley Harvey with T/S aware    Spoke to patient's husband Jamie Allen , very frustrated wanting answers to may LOG was denied .  Will ask SW for to call husband .  Husband had questions regarding Medicaid .  Advised husband to call financial counselor .   Provided number , stated he will call this afternoon.   Husband concerned about getting patient in and out of home ( multiple stairs ) . States he cant afford a wheelchair .       Expected Discharge Date:                  Expected Discharge Plan:  Home/Self Care  In-House Referral:  Clinical Social Work  Discharge planning Services  CM Consult  Post Acute Care Choice:    Choice offered to:     DME Arranged:    DME Agency:     HH Arranged:    HH Agency:     Status of Service:  In process, will continue to follow  Medicare Important Message Given:    Date Medicare IM Given:    Medicare IM give by:    Date Additional Medicare IM Given:    Additional Medicare Important Message give by:     If discussed at [place] of Stay Meetings, dates discussed:    Additional Comments:  Jamie Favre, RN 09/26/2015, 1:56 PM

## 2015-09-26 NOTE — Clinical Social Work Note (Signed)
CSW was notified by Asst CSW Director that patient does not qualify for Letter of Guarantee (LOG) for SNF placement.  CSW contacted patient's husband, [name], [phone].  CSW offered private pay to husband who states he cannot afford private pay.  Husband is agreeable to taking patient home, but states he will need assistance in caring for her.  CSW is unaware of what patient qualifies for regarding home health services.  RNCM consulted and is agreeable to contact husband and discuss home health and DME needs.  Nonnie Done, LCSW [phone]  [facility] Licensed Clinical Social Worker

## 2015-09-26 NOTE — Clinical Social Work Note (Signed)
Clinical Social Work Assessment  Patient Details  Name: Jamie Allen MRN: [id number] Date of Birth: Dec 12, 1970  Date of referral:  09/26/15               Reason for consult:  Facility Placement                Permission sought to share information with:  Chartered certified accountant granted to share information::  Yes, Verbal Permission Granted  Name::      Jamie Allen, husband)  Agency::   [facility] SNFs)  Relationship::     Contact Information:     Housing/Transportation Living arrangements for the past 2 months:  Apartment Source of Information:  Spouse Jamie Allen) Patient Interpreter Needed:  None Criminal Activity/Legal Involvement Pertinent to Current Situation/Hospitalization:  No - Comment as needed Significant Relationships:    Lives with:  Spouse Do you feel safe going back to the place where you live?  No (husband does not feel that he can sufficiently care for patient at home.) Need for family participation in patient care:  Yes (Comment) (patient's request husband to be involved)  Care giving concerns:  Patient husband is concerned that patient will be high risk for falls at home due to new onset neuropathy.     Social Worker assessment / plan:  CSW spoke with husband to inform husband that patient does not qualify for Letter of Guarantee (LOG) for SNF placement.  Husband states that he works two jobs and goes to school and is not able to provide 24/7 care for patient at time of discharge.  Patient and patient's husband are not able to private pay for SNF.  Husband requests wheelchair at time of discharge.  Unsure if patient qualifies for DME due to no payer source.  CSW advised husband that RNCM would not be involved and asked permission for RNCM to contact husband.  Husband, Jamie Allen [id number] is agreeable to any assistance that can be offered.  Jamie Allen made it clear that resources were very limited and funds were tight as patient has not worked "in a while".   Husband states that patient "used to be" vibrant and full of life.  Jamie Allen states that patient has decline rather quickly in the past few months.  Jamie Allen is aware that financial counseling is involved and has possibly started the Medicaid process here at the hospital.  Jamie Allen states that he is in the process of assisting her in applying for disability.  Employment status:  Disabled (Comment on whether or not currently receiving Disability) (patient has not qualified for disability as of yet, but has applied) Insurance information:   (Medicaid Potential) PT Recommendations:  [place], 24 Hour Supervision Information / Referral to community resources:  [place]  Patient/Family's Response to care:  Patient wishes to return home (poor insight to condition).  Husband is agreeable to SNF placement.  Patient/Family's Understanding of and Emotional Response to Diagnosis, Current Treatment, and Prognosis:  Patient has poor insight to her medical condition and prognosis.  Husband is very realistic regarding level of care needed at time of discharge.  Emotional Assessment Appearance:  Appears older than stated age Attitude/Demeanor/Rapport:   (appropriate) Affect (typically observed):  Accepting, Adaptable Orientation:  Oriented to Self, Oriented to Situation, Oriented to Place Alcohol / Substance use:  Never Used Psych involvement (Current and /or in the community):  No (Comment)  Discharge Needs  Concerns to be addressed:  No discharge needs identified Readmission within the last 30  days:  No Current discharge risk:  Inadequate Financial Supports, Chronically ill Barriers to Discharge:  Continued Medical Work up   [employer], LCSW 09/26/2015, 1:50 PM

## 2015-09-26 NOTE — Clinical Social Work Note (Signed)
CSW received phone call from patient's husband Jamie Allen [phone] saying he is looking for placement for patient to go to SNF.  CSW expressed to patient's husband that it is ultimate up to the patient to make the decision because the hospital can not force a patient to go to SNF.  Patient's husband states he is working two jobs and trying to go to school and he is not able to stay with her or provide 24/7 coverage.  Patient's husband supports patient going to SNF but he is not sure if she will agree.  Patient's husband stated he did not want CSW to speak about SNFs in front her because he was going to try to explain it to patient first about what to expect at SNF and explain the benefits about it.  CSW was going to go see patient to complete assessment, but patient was still in so much pain that CSW could not speak to patient.  CSW explained to patient's husband the process of SNF placement and expressed that she may have to go out of county due to her not having any insurance currently.  Patient's husband asked questions about how to apply for Medicaid and Disability, CSW referred him to DSS and financial counseling department, which he stated he has the phone number for and he has received an application from DSS regarding medicaid to begin Medicaid process.  CSW to work on trying to find placement for patient to go to SNF for short term rehab.  Jones Broom. Drew, MSW, [place]

## 2015-09-26 NOTE — Progress Notes (Signed)
Staff from [facility] called and stated pt can not have her MRI until 11/9 around 1AM d/t pt having contrast this AM.  She says contrast stays in the system for 48 hrs.  MD made aware.  No new orders.

## 2015-09-26 NOTE — Care Management Note (Signed)
Case Management Note  Patient Details  Name: Jamie Allen MRN: [id number] Date of Birth: 01-16-1971  Subjective/Objective:                    Action/Plan:  Per Zack SW patient will not be approved for LOG for SNF . Edwyna Ready will call husband / friend.  He will offer SNF at private pay.    Home health can be arranged , however, patient unnisured , [facility] will follow Medicaid guidelines for home health PT , and patient does not have a Medicaid qualifying DX for HHPT .   Could possibility arrange [facility] to check on patient . [facility] would discuss payment with patient and family if ordered .    Dr Ola Spurr aware of above . Expected Discharge Date:                  Expected Discharge Plan:  Home/Self Care  In-House Referral:  Clinical Social Work  Discharge planning Services     Post Acute Care Choice:    Choice offered to:     DME Arranged:    DME Agency:     HH Arranged:    [place] Agency:     Status of Service:  In process, will continue to follow  Medicare Important Message Given:    Date Medicare IM Given:    Medicare IM give by:    Date Additional Medicare IM Given:    Additional Medicare Important Message give by:     If discussed at [place] of Stay Meetings, dates discussed:    Additional Comments:  Marilu Favre, RN 09/26/2015, 11:10 AM

## 2015-09-27 ENCOUNTER — Encounter (HOSPITAL_COMMUNITY): Payer: Self-pay | Admitting: *Deleted

## 2015-09-27 ENCOUNTER — Other Ambulatory Visit: Payer: Self-pay | Admitting: Internal Medicine

## 2015-09-27 MED ORDER — OXYCODONE-ACETAMINOPHEN 10-325 MG PO TABS: 1.0000 | ORAL_TABLET | ORAL | Status: DC | PRN

## 2015-09-27 MED ORDER — HYDROCODONE-ACETAMINOPHEN 7.5-500 MG PO TABS: 1.0000 | ORAL_TABLET | Freq: Four times a day (QID) | ORAL | Status: DC | PRN

## 2015-09-27 MED ORDER — GABAPENTIN 300 MG PO CAPS: 900.0000 mg | ORAL_CAPSULE | Freq: Three times a day (TID) | ORAL | Status: DC

## 2015-09-27 MED ORDER — HYDROCORTISONE-ACETIC ACID 1-2 % OT SOLN
4.0000 [drp] | OTIC | Status: DC
  Filled 2015-09-27: qty 10

## 2015-09-27 MED ORDER — AMITRIPTYLINE HCL 25 MG PO TABS: 25.0000 mg | ORAL_TABLET | Freq: Every day | ORAL | Status: DC

## 2015-09-27 NOTE — Care Management (Signed)
[place] letter given and explained to patient and [name]. Both voiced understanding. Magdalen Spatz RN BSN [phone]

## 2015-09-27 NOTE — Progress Notes (Signed)
Discharge paperwork given to patient. Lortab prescription at the bedside for MD to sign. Per patient, husband on the way to pick her up.

## 2015-09-27 NOTE — Care Management Note (Addendum)
Case Management Note  Patient Details  Name: Jamie Allen MRN: [id number] Date of Birth: 1971-08-07  Subjective/Objective:                    Action/Plan:  Medical Director , Dr Reynaldo Minium has spoken to [place] , Attending and [facility] Director of Social Work. [facility] will pay for home health PT visits .  [facility] is checking to see if patient qualifies for wheelchair and 3 in 1 through charity .   Attending discussing above with patient and husband .   See previous notes . Discussed case with Dr Reynaldo Minium ( MD advisor ) and Dr Siri Cole . Patient will discharge to home today . MD will order wheelchair and 3 in 1 . I called [name] , left voice mail with my return phone number .    [name] returned call . Discussed above with him . He is aware discharge is today and MD ordering wheelchair and 3 in 1 ( [facility] also aware) . Again discussed she does not qualify for HHPT and why.  [name] is on his way to Leina's room now. Bedside nurse aware.  DME will be provided through charity . MD aware.   Expected Discharge Date:                  Expected Discharge Plan:  Home/Self Care  In-House Referral:  Clinical Social Work  Discharge planning Services  CM Consult  Post Acute Care Choice:  Durable Medical Equipment Choice offered to:     DME Arranged:  3-N-1, Programmer, multimedia DME Agency:  [place]:    [place] Agency:     Status of Service:  Completed, signed off  Medicare Important Message Given:    Date Medicare IM Given:    Medicare IM give by:    Date Additional Medicare IM Given:    Additional Medicare Important Message give by:     If discussed at [place] of Stay Meetings, dates discussed:    Additional Comments:  Marilu Favre, RN 09/27/2015, 11:13 AM

## 2015-09-27 NOTE — Discharge Instructions (Signed)
You were hospitalized for an ankle fracture. Keep wearing your ankle boot until you are seen by the orthopedic doctor. We are sending you with pain medication to help you get through this acute pain. We also increased your gabapentin and added amitriptyline to help with your neuropathy.

## 2015-09-27 NOTE — Progress Notes (Deleted)
Patient complaining of left ear pain. Examined with otoscope and found erythema of the L ear canal with some mild cerumen. TM membrane appeared normal. Suspect acute otitis externa. Ordered acetic acid with hydrocortisone otic drops to be placed in L ear 4 drops q4 hours x5 days.   Phill Myron, D.O. 09/27/2015, 5:25 PM PGY-1, [place]

## 2015-09-27 NOTE — Progress Notes (Signed)
Occupational Therapy Evaluation [name] MRN: [id number] DOB: [birth year] Today's Date: 09/27/2015    History of Present Illness Pt admitted with left ankle fx after fall. PMH is significant for HTN, diarrhea-predominant IBS, NASH, GERD, colitis, PTSD, gallbladder disease and history of C diff   Clinical Impression   Patient presents to OT functioning at min guard-S level with ADLs with plans to d/c home today per patient and husband. Education completed regarding ADLs at home. Patient states that the hospital will be delivering a 3 in 1 and w/c for home.    Follow Up Recommendations  Home health OT;Supervision/Assistance - 24 hour    Equipment Recommendations  3 in 1 bedside comode    Recommendations for Other Services       Precautions / Restrictions Precautions Precautions: Fall Restrictions Weight Bearing Restrictions: Yes LLE Weight Bearing: Partial weight bearing LLE Partial Weight Bearing Percentage or Pounds: unspecified      Mobility Bed Mobility Overal bed mobility: Modified Independent                Transfers Overall transfer level: Needs assistance Equipment used: Rolling walker (2 wheeled) Transfers: Sit to/from Stand Sit to Stand: Supervision;Min guard         General transfer comment: cues for hand placement/technique    Balance                                      ADL Overall ADL's : Needs assistance/impaired Eating/Feeding: Independent;Sitting   Grooming: Wash/dry hands;Supervision/safety;Min guard   Upper Body Bathing: Set up;Sitting   Lower Body Bathing: Supervison/ safety;Min guard;Sit to/from stand   Upper Body Dressing : Set up;Sitting   Lower Body Dressing: Supervision/safety;Min guard;Sit to/from stand   Toilet Transfer: Supervision/safety;Min guard;Ambulation;Comfort height toilet;Grab bars;RW   Toileting- Clothing Manipulation and Hygiene:  Supervision/safety;Sitting/lateral lean       Functional mobility during ADLs: Supervision/safety;Min guard;Rolling walker General ADL Comments: Patient agreeable to OT session. Husband present and expressing frustration with discharge plans. Patient able to get to EOB on her own. From seated position she demonstrated ability to reach all body parts. She then ambulated to and from the bathroom for toileting simulation. Per husband, they are getting a w/c and 3 in 1 for patient for home use. Educated patient to slow down while performing mobility/ADL tasks at home.      Vision     Perception     Praxis      Pertinent Vitals/Pain Pain Assessment: 0-10 Pain Score: 10-Worst pain ever Pain Location: L foot and B legs Pain Descriptors / Indicators: Aching;Burning;Discomfort;Sore Pain Intervention(s): Limited activity within patient's tolerance;Monitored during session;Relaxation     Hand Dominance     Extremity/Trunk Assessment Upper Extremity Assessment Upper Extremity Assessment: Generalized weakness   Lower Extremity Assessment Lower Extremity Assessment: Defer to PT evaluation       Communication Communication Communication: No difficulties   Cognition Arousal/Alertness: Awake/alert Behavior During Therapy: WFL for tasks assessed/performed Overall Cognitive Status: Impaired/Different from baseline Area of Impairment: Memory;Orientation;Safety/judgement;Problem solving Orientation Level: Disoriented to;Time   Memory: Decreased short-term memory   Safety/Judgement: Decreased awareness of deficits;Decreased awareness of safety   Problem Solving: Slow processing     General Comments       Exercises       Shoulder Instructions      Home Living Family/patient expects to be discharged to:: Private residence Living  Arrangements: Spouse/significant other Available Help at Discharge: Family;Available PRN/intermittently Type of Home: Apartment Home Access: Stairs to  enter Entrance Stairs-Number of Steps: 14   Home Layout: One level Alternate Level Stairs-Number of Steps: flight Alternate Level Stairs-Rails: Right Bathroom Shower/Tub: Teacher, Olubunmi Rothenberger years/pre: Standard Bathroom Accessibility: Yes How Accessible: Accessible via walker Home Equipment: [employer] - 2 wheels;Shower seat;Grab bars - tub/shower          Prior Functioning/Environment Level of Independence: Needs assistance  Gait / Transfers Assistance Needed: assistance walking to and from bathroom per patient and husband ADL's / Homemaking Assistance Needed: assistance with toileting and bathing per pt and husband        OT Diagnosis: Acute pain;Generalized weakness   OT Problem List: Decreased strength;Decreased range of motion;Impaired balance (sitting and/or standing);Decreased safety awareness;Decreased knowledge of use of DME or AE;Decreased cognition;Pain;Decreased knowledge of precautions   OT Treatment/Interventions:      OT Goals(Current goals can be found in the care plan section) Acute Rehab OT Goals Patient Stated Goal: return home with husband OT Goal Formulation: All assessment and education complete, DC therapy  OT Frequency:     Barriers to D/C:            Co-evaluation              End of Session Equipment Utilized During Treatment: Rolling walker;Other (comment) (CAM boot)  Activity Tolerance: Patient tolerated treatment well Patient left: in bed;with call bell/phone within reach;with nursing/sitter in room;with family/visitor present   Time: 1228-1252 OT Time Calculation (min): 24 min Charges:  OT General Charges $OT Visit: 1 Procedure OT Evaluation $Initial OT Evaluation Tier I: 1 Procedure OT Treatments $Self Care/Home Management : 8-22 mins G-Codes:    Jamie Allen A 16-Oct-2015, 1:36 PM

## 2015-09-27 NOTE — Progress Notes (Signed)
Physical Therapy Treatment [name] MRN: [id number] DOB: [birth year] Today's Date: 09/27/2015    History of Present Illness Pt admitted with left ankle fx after fall. PMH is significant for HTN, diarrhea-predominant IBS, NASH, GERD, colitis, PTSD, gallbladder disease and history of C diff    PT Comments    Pt was less emotional and anxious today. She did not want to perform stairs today but stated she would do them tomorrow. Pt was able to keep head up and hold a conversation while walking. Her only complaint during activity was the neuropathy. Will continue to follow to increase balance, LE strength, and safety with functional mobility to decrease her burden of care.   Follow Up Recommendations  Home health PT;Supervision/Assistance - 24 hour     Equipment Recommendations  3in1 (PT)    Recommendations for Other Services       Precautions / Restrictions Precautions Precautions: Fall Restrictions Weight Bearing Restrictions: Yes LLE Weight Bearing: Partial weight bearing LLE Partial Weight Bearing Percentage or Pounds: unspecified    Mobility  Bed Mobility Overal bed mobility: Modified Independent                Transfers Overall transfer level: Needs assistance     Sit to Stand: Min guard         General transfer comment: Cues for technique and hand placement; as well as encouragement to participate  Ambulation/Gait Ambulation/Gait assistance: Min guard Ambulation Distance (Feet): 200 Feet Assistive device: Rolling walker (2 wheeled) Gait Pattern/deviations: Step-through pattern;Decreased stride length   Gait velocity interpretation: Below normal speed for age/gender General Gait Details: cues for sequnce, safety and function. Frequent cues to keep walker close and bear weight through hands   Stairs            Wheelchair Mobility    Modified Rankin (Stroke Patients Only)       Balance Overall balance  assessment: Needs assistance Sitting-balance support: No upper extremity supported;Feet supported Sitting balance-Leahy Scale: Good     Standing balance support: No upper extremity supported Standing balance-Leahy Scale: Fair Standing balance comment: Used RW during movement                    Cognition Arousal/Alertness: Awake/alert Behavior During Therapy: WFL for tasks assessed/performed Overall Cognitive Status: Impaired/Different from baseline Area of Impairment: Memory;Orientation;Safety/judgement;Problem solving Orientation Level: Disoriented to;Time   Memory: Decreased short-term memory   Safety/Judgement: Decreased awareness of deficits;Decreased awareness of safety   Problem Solving: Slow processing      Exercises General Exercises - Lower Extremity Long Arc Quad: AROM;Seated;15 reps;Both Hip ABduction/ADduction: AROM;Seated;Both;20 reps Hip Flexion/Marching: AROM;Seated;Both;15 reps    General Comments        Pertinent Vitals/Pain Pain Assessment: 0-10 Pain Score: 9  Pain Location: LLE Pain Descriptors / Indicators: Aching Pain Intervention(s): Limited activity within patient's tolerance;Monitored during session;Premedicated before session;Repositioned    Home Living                      Prior Function            PT Goals (current goals can now be found in the care plan section) Progress towards PT goals: Progressing toward goals    Frequency       PT Plan Current plan remains appropriate    Co-evaluation             End of Session Equipment Utilized During Treatment: Gait belt;Other (comment) (CAM boot  LLE) Activity Tolerance: Patient tolerated treatment well Patient left: in chair;with call bell/phone within reach;with nursing/sitter in room     Time: 1053-1116 PT Time Calculation (min) (ACUTE ONLY): 23 min  Charges:  $Gait Training: 8-22 mins $Therapeutic Exercise: 8-22 mins                    G CodesHaynes Bast 30-Sep-2015, 11:29 AM Haynes Bast, SPT Sep 30, 2015 11:29 AM

## 2015-09-27 NOTE — Progress Notes (Signed)
FMTS Attending Note:  Chrisandra Netters MD Personal pager:  [phone] FPTS Service Pager:  [phone]  Patient seen today on rounds. Medically, her safest disposition is SNF. This has been unattainable due to financial barriers, as patient has no insurance and is unable to pay for SNF stay privately. I was contacted by the medical director of case management department Dr. Reynaldo Minium, who has informed me that under no circumstances is [facility] able to provide letter of guarantee for patient's SNF stay. Evidently, she is not financially eligible for Medicaid and that is a requirement of her to receive a letter of guarantee. According to Dr. Reynaldo Minium, there is no room to budge on this issue. She cannot remain in the hospital indefinitely, and thus must be discharged home. I expressed my concerns about her physical safety in the home environment and still was told that she would have to be discharged to home.   Patient and her husband [name] have been understandably frustrated with navigating the health care system without access to affordable health insurance. We discussed the best ways to optimize her safety and recovery in her home environment. [name] requested that if possible, home health physical therapy would at least provide some chance of improvement after discharge. Again, though, financial barriers exist to even making that happen. [name] is a full time Ship broker and works two jobs and is thus unable to care for her around the clock. The family is behind on all of their bills making any extra expenses difficult to cover.    With the help of Dr. Reynaldo Minium, care management and the social work department, we were able to set patient up with charity care DME 3-in-1 commode and wheelchair, and with discounted home health physical therapy through [facility]. While not ideal, this should at least be of some help. Patient and her husband were appreciative of the effort put into setting these arrangements up.    Leeanne Rio, MD 09/27/2015

## 2015-09-28 ENCOUNTER — Telehealth: Payer: Self-pay | Admitting: *Deleted

## 2015-09-28 NOTE — Telephone Encounter (Signed)
Left message with Husband to return call

## 2015-09-28 NOTE — Telephone Encounter (Signed)
-----   Message from Boykin Nearing, MD sent at 09/26/2015  8:24 AM EST ----- Thiamine is on lower half of normal Continue thiamine supplement as low thiamine can cause peripheral neuropathy

## 2015-10-02 ENCOUNTER — Encounter: Payer: Self-pay | Admitting: Neurology

## 2015-10-02 ENCOUNTER — Ambulatory Visit
Admission: RE | Admit: 2015-10-02 | Discharge: 2015-10-02 | Disposition: A | Discharge status: Home / Self Care | Payer: No Typology Code available for payment source | Source: Ambulatory Visit | Attending: Neurology | Admitting: Neurology

## 2015-10-02 ENCOUNTER — Ambulatory Visit (INDEPENDENT_AMBULATORY_CARE_PROVIDER_SITE_OTHER): Payer: Self-pay | Admitting: Neurology

## 2015-10-02 VITALS — BP 118/76 | HR 96

## 2015-10-02 DIAGNOSIS — R202 Paresthesia of skin: Secondary | ICD-10-CM

## 2015-10-02 DIAGNOSIS — R269 Unspecified abnormalities of gait and mobility: Secondary | ICD-10-CM | POA: Insufficient documentation

## 2015-10-02 MED ORDER — PREGABALIN 150 MG PO CAPS: 150.0000 mg | ORAL_CAPSULE | Freq: Two times a day (BID) | ORAL | Status: DC
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
## 2015-10-03 ENCOUNTER — Ambulatory Visit: Payer: Self-pay | Admitting: Gastroenterology

## 2015-10-03 ENCOUNTER — Other Ambulatory Visit (INDEPENDENT_AMBULATORY_CARE_PROVIDER_SITE_OTHER): Payer: Self-pay

## 2015-10-03 ENCOUNTER — Ambulatory Visit (INDEPENDENT_AMBULATORY_CARE_PROVIDER_SITE_OTHER): Payer: Self-pay | Admitting: Neurology

## 2015-10-03 ENCOUNTER — Other Ambulatory Visit: Payer: Self-pay | Admitting: Neurology

## 2015-10-03 DIAGNOSIS — Z0289 Encounter for other administrative examinations: Secondary | ICD-10-CM

## 2015-10-03 DIAGNOSIS — R531 Weakness: Secondary | ICD-10-CM

## 2015-10-03 DIAGNOSIS — R202 Paresthesia of skin: Secondary | ICD-10-CM

## 2015-10-03 DIAGNOSIS — G629 Polyneuropathy, unspecified: Secondary | ICD-10-CM

## 2015-10-03 DIAGNOSIS — R269 Unspecified abnormalities of gait and mobility: Secondary | ICD-10-CM

## 2015-10-03 LAB — THYROID PANEL WITH TSH
Free Thyroxine Index: 2 (ref 1.2–4.9)
T3 UPTAKE RATIO: 20 % — AB (ref 24–39)
T4, Total: 9.8 ug/dL (ref 4.5–12.0)
TSH: 9.13 u[IU]/mL — ABNORMAL HIGH (ref 0.450–4.500)

## 2015-10-03 LAB — HGB A1C W/O EAG: Hgb A1c MFr Bld: 5.1 % (ref 4.8–5.6)

## 2015-10-03 LAB — ANA W/REFLEX IF POSITIVE
ANA: POSITIVE — AB
Centromere Ab Screen: <0.2 AI (ref 0.0–0.9)
ENA RNP Ab: 4 AI — ABNORMAL HIGH (ref 0.0–0.9)
ENA SM Ab Ser-aCnc: <0.2 AI (ref 0.0–0.9)
Scleroderma SCL-70: <0.2 AI (ref 0.0–0.9)
dsDNA Ab: <1 IU/mL (ref 0–9)

## 2015-10-03 LAB — C-REACTIVE PROTEIN: CRP: 42 mg/L — AB (ref 0.0–4.9)

## 2015-10-03 LAB — PROTEIN ELECTROPHORESIS
A/G Ratio: 0.8 (ref 0.7–1.7)
ALBUMIN ELP: 3 g/dL (ref 2.9–4.4)
ALPHA 1: 0.4 g/dL (ref 0.0–0.4)
Alpha 2: 0.9 g/dL (ref 0.4–1.0)
BETA: 1.3 g/dL (ref 0.7–1.3)
GAMMA GLOBULIN: 1.1 g/dL (ref 0.4–1.8)
Globulin, Total: 3.7 g/dL (ref 2.2–3.9)
Total Protein: 6.7 g/dL (ref 6.0–8.5)

## 2015-10-03 LAB — HEPATITIS PANEL, ACUTE
HEP B C IGM: NEGATIVE
Hep A IgM: NEGATIVE
Hepatitis B Surface Ag: NEGATIVE

## 2015-10-03 LAB — SEDIMENTATION RATE: Sed Rate: 47 mm/hr — ABNORMAL HIGH (ref 0–32)

## 2015-10-03 LAB — COPPER, SERUM: COPPER: 207 ug/dL — AB (ref 72–166)

## 2015-10-03 LAB — FOLATE: FOLATE: 16.9 ng/mL (ref >3.0)

## 2015-10-03 LAB — CK: CK TOTAL: 55 U/L (ref 24–173)

## 2015-10-03 LAB — RPR: RPR Ser Ql: NONREACTIVE

## 2015-10-03 LAB — VITAMIN B12: VITAMIN B 12: 436 pg/mL (ref 211–946)

## 2015-10-03 NOTE — Procedures (Signed)
   NCS (NERVE CONDUCTION STUDY) WITH EMG (ELECTROMYOGRAPHY) REPORT   STUDY DATE: October 03 2015  PATIENT NAME: Jamie Allen DOB: [birth year] MRN: [id number]    TECHNOLOGIST: Laretta Alstrom  ELECTROMYOGRAPHER: Marcial Pacas M.D.  CLINICAL INFORMATION:  44 years old female with subacute onset bilateral lower extremity and bilateral hands paresthesia, distal weakness, gait difficulty  FINDINGS: NERVE CONDUCTION STUDY: Bilateral peroneal sensory responses were absent. Bilateral peroneal to EDB, and tibial motor responses showed mildly decreased C map amplitude, with normal distal latency, conduction velocity. Bilateral tibial H reflexes were absent.  Left median, ulnar sensory and motor responses were normal.   NEEDLE ELECTROMYOGRAPHY: Selected needle examination was performed at right upper extremity muscles, right cervical paraspinal muscles, right lower extremity muscles, and right lumbosacral paraspinal muscles.   Right first dorsal interossei, pronator teres, biceps, triceps, deltoid, extensor digitorum communis was normal  There was no spontaneous activity at right cervical paraspinal muscles, right C 5, 6, 7.  Needle examination of right lower extremity muscles, right tibialis anterior, tibialis posterior, medial gastrocnemius, vastus lateralis, biceps femoris long head was normal.  There was no spontaneous activity at right lumbosacral paraspinal muscles, right L4-5 S1.  IMPRESSION:  This is a mild abnormal study. 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 There is no electrodiagnostic evidence of demyelinating polyradiculoneuropathy, there is no evidence of right cervical, or right lumbosacral radiculopathies to explain the difficulties.   INTERPRETING PHYSICIAN:   Marcial Pacas M.D. Ph.D. [facility] [address], [place], [place] [address] [phone]

## 2015-10-03 NOTE — Addendum Note (Signed)
Addended by: Marcial Pacas on: 10/03/2015 03:27 PM   Modules accepted: Orders

## 2015-10-03 NOTE — Addendum Note (Signed)
Addended by: Marcial Pacas on: 10/03/2015 02:50 PM   Modules accepted: Orders

## 2015-10-03 NOTE — Addendum Note (Signed)
Addended by: Marcial Pacas on: 10/03/2015 03:29 PM   Modules accepted: Orders

## 2015-10-03 NOTE — Progress Notes (Signed)
Patient continue complains of significant gait difficulty, bilateral lower extremity and bilateral hands paresthesia, distal weakness, she denies bowel and bladder incontinence,  I have personally reviewed MRI of lumbar spine there was no significant structural abnormality  EMG nerve conduction study today showed mild length dependent axonal peripheral neuropathy, which will not explain the profound gait difficulty and distal weakness, sensory loss on today's examination, she does has variable effort on exam,  Lumbar puncture to rule out demyelinating process  Patient was seen left recumbent position, L4-5 space was identified, used to steroid technic, local anesthesia with 4% lidocaine.  Needle advanced without difficulty, open pressure was 20 cm water, clear colorless, collected 4 bottles, 3 cc at each bottle.  She was able to tolerate this procedure well, advised to lie flat for 2 hours,

## 2015-10-03 NOTE — Addendum Note (Signed)
Addended by: Marcial Pacas on: 10/03/2015 10:13 AM   Modules accepted: Level of Service

## 2015-10-03 NOTE — Addendum Note (Signed)
Addended by: Marcial Pacas on: 10/03/2015 02:55 PM   Modules accepted: Orders

## 2015-10-04 ENCOUNTER — Telehealth: Payer: Self-pay | Admitting: Neurology

## 2015-10-04 ENCOUNTER — Ambulatory Visit
Admission: RE | Admit: 2015-10-04 | Discharge: 2015-10-04 | Disposition: A | Discharge status: Home / Self Care | Payer: No Typology Code available for payment source | Source: Ambulatory Visit | Attending: Neurology | Admitting: Neurology

## 2015-10-04 ENCOUNTER — Other Ambulatory Visit: Payer: Self-pay

## 2015-10-04 DIAGNOSIS — R531 Weakness: Secondary | ICD-10-CM

## 2015-10-04 DIAGNOSIS — R269 Unspecified abnormalities of gait and mobility: Secondary | ICD-10-CM

## 2015-10-04 LAB — GLUCOSE, CSF: Glucose, CSF: 44 mg/dL (ref 40–70)

## 2015-10-04 LAB — VDRL, CSF: VDRL Quant, CSF: NONREACTIVE

## 2015-10-04 LAB — CELL COUNT, CSF

## 2015-10-04 LAB — PROTEIN, CSF: TOTAL PROTEIN, CSF: 26 mg/dL (ref 0.0–44.0)

## 2015-10-04 NOTE — Telephone Encounter (Signed)
Spoke to patient - aware of results.  She will call her PCP to make an appt.  She will also keep her pending appts for MRI and follow up with Dr. Krista Blue.

## 2015-10-04 NOTE — Telephone Encounter (Signed)
Please call patient, spinal fluid testing was normal, in specific, total protein was 26, there is no evidence of demyelinating polyradiculoneuropathy  Laboratory evaluation showed elevated TSH, consistent with hypothyroidism,  In addition, there was evidence of elevated ESR, C-reactive protein, positive ANA, ENA RNP antibodies, this can be associated with inflammatory process.   I have forwarded to laboratory evaluation to her primary care doctor Boykin Nearing, she needs to follow-up with her primary care physician.

## 2015-10-05 LAB — THYROID PANEL WITH TSH
Free Thyroxine Index: 2.2 (ref 1.2–4.9)
T3 UPTAKE RATIO: 22 % — AB (ref 24–39)
T4, Total: 10.1 ug/dL (ref 4.5–12.0)
TSH: 10.39 u[IU]/mL — ABNORMAL HIGH (ref 0.450–4.500)

## 2015-10-05 LAB — GRAM STAIN: ORGANISM ID, BACTERIA: NONE SEEN

## 2015-10-05 LAB — THYROGLOBULIN ANTIBODY: THYROGLOBULIN ANTIBODY: 1 [IU]/mL — AB (ref 0.0–0.9)

## 2015-10-05 LAB — C-REACTIVE PROTEIN: CRP: 50.8 mg/L — AB (ref 0.0–4.9)

## 2015-10-05 LAB — THYROID PEROXIDASE ANTIBODY: Thyroperoxidase Ab SerPl-aCnc: 16 IU/mL (ref 0–34)

## 2015-10-05 LAB — SEDIMENTATION RATE: SED RATE: 33 mm/h — AB (ref 0–32)

## 2015-10-06 ENCOUNTER — Emergency Department (HOSPITAL_COMMUNITY): Payer: No Typology Code available for payment source

## 2015-10-06 ENCOUNTER — Inpatient Hospital Stay (HOSPITAL_COMMUNITY)
Admission: EM | Admit: 2015-10-06 | Discharge: 2015-10-10 | DRG: 494 | Disposition: A | Discharge status: Skilled Nursing Facility | Payer: No Typology Code available for payment source | Attending: Family Medicine | Admitting: Family Medicine

## 2015-10-06 ENCOUNTER — Encounter (HOSPITAL_COMMUNITY): Payer: Self-pay | Admitting: Emergency Medicine

## 2015-10-06 DIAGNOSIS — W010XXA Fall on same level from slipping, tripping and stumbling without subsequent striking against object, initial encounter: Secondary | ICD-10-CM | POA: Diagnosis present

## 2015-10-06 DIAGNOSIS — K5900 Constipation, unspecified: Secondary | ICD-10-CM | POA: Diagnosis present

## 2015-10-06 DIAGNOSIS — S82841A Displaced bimalleolar fracture of right lower leg, initial encounter for closed fracture: Secondary | ICD-10-CM | POA: Diagnosis present

## 2015-10-06 DIAGNOSIS — F17210 Nicotine dependence, cigarettes, uncomplicated: Secondary | ICD-10-CM | POA: Diagnosis present

## 2015-10-06 DIAGNOSIS — S82851A Displaced trimalleolar fracture of right lower leg, initial encounter for closed fracture: Principal | ICD-10-CM

## 2015-10-06 DIAGNOSIS — E538 Deficiency of other specified B group vitamins: Secondary | ICD-10-CM | POA: Diagnosis present

## 2015-10-06 DIAGNOSIS — D649 Anemia, unspecified: Secondary | ICD-10-CM | POA: Diagnosis present

## 2015-10-06 DIAGNOSIS — I10 Essential (primary) hypertension: Secondary | ICD-10-CM | POA: Diagnosis present

## 2015-10-06 DIAGNOSIS — G5791 Unspecified mononeuropathy of right lower limb: Secondary | ICD-10-CM

## 2015-10-06 DIAGNOSIS — K7581 Nonalcoholic steatohepatitis (NASH): Secondary | ICD-10-CM | POA: Diagnosis present

## 2015-10-06 DIAGNOSIS — E559 Vitamin D deficiency, unspecified: Secondary | ICD-10-CM | POA: Diagnosis present

## 2015-10-06 DIAGNOSIS — Z419 Encounter for procedure for purposes other than remedying health state, unspecified: Secondary | ICD-10-CM

## 2015-10-06 DIAGNOSIS — F329 Major depressive disorder, single episode, unspecified: Secondary | ICD-10-CM | POA: Diagnosis present

## 2015-10-06 DIAGNOSIS — F4310 Post-traumatic stress disorder, unspecified: Secondary | ICD-10-CM | POA: Diagnosis present

## 2015-10-06 DIAGNOSIS — Z888 Allergy status to other drugs, medicaments and biological substances status: Secondary | ICD-10-CM

## 2015-10-06 DIAGNOSIS — Y9301 Activity, walking, marching and hiking: Secondary | ICD-10-CM | POA: Diagnosis present

## 2015-10-06 DIAGNOSIS — F1410 Cocaine abuse, uncomplicated: Secondary | ICD-10-CM | POA: Diagnosis present

## 2015-10-06 DIAGNOSIS — F418 Other specified anxiety disorders: Secondary | ICD-10-CM

## 2015-10-06 DIAGNOSIS — S92902A Unspecified fracture of left foot, initial encounter for closed fracture: Secondary | ICD-10-CM

## 2015-10-06 DIAGNOSIS — G629 Polyneuropathy, unspecified: Secondary | ICD-10-CM | POA: Diagnosis present

## 2015-10-06 DIAGNOSIS — K829 Disease of gallbladder, unspecified: Secondary | ICD-10-CM | POA: Diagnosis present

## 2015-10-06 DIAGNOSIS — S82892A Other fracture of left lower leg, initial encounter for closed fracture: Secondary | ICD-10-CM | POA: Diagnosis present

## 2015-10-06 DIAGNOSIS — Z886 Allergy status to analgesic agent status: Secondary | ICD-10-CM

## 2015-10-06 DIAGNOSIS — S92909A Unspecified fracture of unspecified foot, initial encounter for closed fracture: Secondary | ICD-10-CM | POA: Diagnosis present

## 2015-10-06 DIAGNOSIS — E02 Subclinical iodine-deficiency hypothyroidism: Secondary | ICD-10-CM | POA: Diagnosis present

## 2015-10-06 DIAGNOSIS — Y92019 Unspecified place in single-family (private) house as the place of occurrence of the external cause: Secondary | ICD-10-CM

## 2015-10-06 DIAGNOSIS — F419 Anxiety disorder, unspecified: Secondary | ICD-10-CM | POA: Diagnosis present

## 2015-10-06 DIAGNOSIS — G5792 Unspecified mononeuropathy of left lower limb: Secondary | ICD-10-CM

## 2015-10-06 DIAGNOSIS — K219 Gastro-esophageal reflux disease without esophagitis: Secondary | ICD-10-CM | POA: Diagnosis present

## 2015-10-06 DIAGNOSIS — F32A Depression, unspecified: Secondary | ICD-10-CM | POA: Diagnosis present

## 2015-10-06 DIAGNOSIS — K7460 Unspecified cirrhosis of liver: Secondary | ICD-10-CM | POA: Diagnosis present

## 2015-10-06 DIAGNOSIS — G5793 Unspecified mononeuropathy of bilateral lower limbs: Secondary | ICD-10-CM | POA: Diagnosis present

## 2015-10-06 DIAGNOSIS — K589 Irritable bowel syndrome without diarrhea: Secondary | ICD-10-CM | POA: Diagnosis present

## 2015-10-06 DIAGNOSIS — Z91041 Radiographic dye allergy status: Secondary | ICD-10-CM

## 2015-10-06 DIAGNOSIS — E876 Hypokalemia: Secondary | ICD-10-CM | POA: Diagnosis not present

## 2015-10-06 DIAGNOSIS — Z885 Allergy status to narcotic agent status: Secondary | ICD-10-CM

## 2015-10-06 DIAGNOSIS — F1020 Alcohol dependence, uncomplicated: Secondary | ICD-10-CM | POA: Diagnosis present

## 2015-10-06 HISTORY — DX: Unspecified fracture of left foot, initial encounter for closed fracture: S92.902A

## 2015-10-06 LAB — I-STAT CHEM 8, ED
BUN: 10 mg/dL (ref 6–20)
CALCIUM ION: 1.19 mmol/L (ref 1.12–1.23)
CHLORIDE: 105 mmol/L (ref 101–111)
CREATININE: 1 mg/dL (ref 0.44–1.00)
GLUCOSE: 102 mg/dL — AB (ref 65–99)
HCT: 38 % (ref 36.0–46.0)
Hemoglobin: 12.9 g/dL (ref 12.0–15.0)
Potassium: 4.1 mmol/L (ref 3.5–5.1)
Sodium: 140 mmol/L (ref 135–145)
TCO2: 22 mmol/L (ref 0–100)

## 2015-10-06 LAB — HEPATIC FUNCTION PANEL
ALT: 18 U/L (ref 14–54)
AST: 29 U/L (ref 15–41)
Albumin: 2.8 g/dL — ABNORMAL LOW (ref 3.5–5.0)
Alkaline Phosphatase: 128 U/L — ABNORMAL HIGH (ref 38–126)
TOTAL PROTEIN: 6.3 g/dL — AB (ref 6.5–8.1)
Total Bilirubin: 0.2 mg/dL — ABNORMAL LOW (ref 0.3–1.2)

## 2015-10-06 LAB — PHOSPHORUS: PHOSPHORUS: 6.3 mg/dL — AB (ref 2.5–4.6)

## 2015-10-06 LAB — PROTIME-INR
INR: 1 (ref 0.00–1.49)
PROTHROMBIN TIME: 13.4 s (ref 11.6–15.2)

## 2015-10-06 LAB — CBC WITH DIFFERENTIAL/PLATELET
Basophils Absolute: 0 10*3/uL (ref 0.0–0.1)
Basophils Relative: 0 %
Eosinophils Absolute: 0.1 10*3/uL (ref 0.0–0.7)
Eosinophils Relative: 2 %
HCT: 36.1 % (ref 36.0–46.0)
HEMOGLOBIN: 11.9 g/dL — AB (ref 12.0–15.0)
LYMPHS ABS: 1.9 10*3/uL (ref 0.7–4.0)
LYMPHS PCT: 29 %
MCH: 34.3 pg — AB (ref 26.0–34.0)
MCHC: 33 g/dL (ref 30.0–36.0)
MCV: 104 fL — AB (ref 78.0–100.0)
MONOS PCT: 5 %
Monocytes Absolute: 0.3 10*3/uL (ref 0.1–1.0)
NEUTROS PCT: 64 %
Neutro Abs: 4 10*3/uL (ref 1.7–7.7)
Platelets: 283 10*3/uL (ref 150–400)
RBC: 3.47 MIL/uL — AB (ref 3.87–5.11)
RDW: 15.4 % (ref 11.5–15.5)
WBC: 6.4 10*3/uL (ref 4.0–10.5)

## 2015-10-06 LAB — RAPID URINE DRUG SCREEN, HOSP PERFORMED
AMPHETAMINES: NOT DETECTED
BARBITURATES: NOT DETECTED
BENZODIAZEPINES: NOT DETECTED
Cocaine: NOT DETECTED
Opiates: POSITIVE — AB
Tetrahydrocannabinol: POSITIVE — AB

## 2015-10-06 LAB — SURGICAL PCR SCREEN
MRSA, PCR: NEGATIVE
Staphylococcus aureus: NEGATIVE

## 2015-10-06 LAB — SPECIMEN STATUS REPORT

## 2015-10-06 LAB — MAGNESIUM: MAGNESIUM: 1.6 mg/dL — AB (ref 1.7–2.4)

## 2015-10-06 LAB — ETHANOL

## 2015-10-06 MED ORDER — SODIUM CHLORIDE 0.9 % IJ SOLN
3.0000 mL | Freq: Two times a day (BID) | INTRAMUSCULAR | Status: DC
  Administered 2015-10-06 – 2015-10-09 (×5): 3 mL via INTRAVENOUS

## 2015-10-06 MED ORDER — FENTANYL CITRATE (PF) 100 MCG/2ML IJ SOLN: 50.0000 ug | Freq: Once | INTRAMUSCULAR | Status: DC

## 2015-10-06 MED ORDER — HYDROMORPHONE HCL 1 MG/ML IJ SOLN: 1.0000 mg | Freq: Once | INTRAMUSCULAR | Status: DC

## 2015-10-06 MED ORDER — HYDROMORPHONE HCL 1 MG/ML IJ SOLN
1.0000 mg | INTRAMUSCULAR | Status: DC | PRN
  Administered 2015-10-06 – 2015-10-07 (×3): 1 mg via INTRAVENOUS
  Filled 2015-10-06 (×4): qty 1

## 2015-10-06 MED ORDER — NALOXONE HCL 0.4 MG/ML IJ SOLN: 0.4000 mg | INTRAMUSCULAR | Status: DC | PRN

## 2015-10-06 MED ORDER — HYDROMORPHONE HCL 1 MG/ML IJ SOLN: 1.0000 mg | INTRAMUSCULAR | Status: DC | PRN

## 2015-10-06 MED ORDER — ONDANSETRON HCL 4 MG/2ML IJ SOLN: 4.0000 mg | Freq: Three times a day (TID) | INTRAMUSCULAR | Status: DC | PRN

## 2015-10-06 MED ORDER — KETOROLAC TROMETHAMINE 30 MG/ML IJ SOLN
30.0000 mg | Freq: Four times a day (QID) | INTRAMUSCULAR | Status: DC
  Administered 2015-10-06 – 2015-10-08 (×7): 30 mg via INTRAVENOUS
  Filled 2015-10-06 (×8): qty 1

## 2015-10-06 MED ORDER — VITAMIN D (ERGOCALCIFEROL) 1.25 MG (50000 UNIT) PO CAPS
50000.0000 [IU] | ORAL_CAPSULE | ORAL | Status: DC
  Administered 2015-10-08: 50000 [IU] via ORAL
  Filled 2015-10-06: qty 1

## 2015-10-06 MED ORDER — FENTANYL CITRATE (PF) 100 MCG/2ML IJ SOLN
100.0000 ug | Freq: Once | INTRAMUSCULAR | Status: AC
  Administered 2015-10-06: 100 ug via INTRAVENOUS
  Filled 2015-10-06: qty 2

## 2015-10-06 MED ORDER — ONDANSETRON HCL 4 MG/2ML IJ SOLN: 4.0000 mg | Freq: Four times a day (QID) | INTRAMUSCULAR | Status: DC | PRN

## 2015-10-06 MED ORDER — PANTOPRAZOLE SODIUM 40 MG PO TBEC
40.0000 mg | DELAYED_RELEASE_TABLET | Freq: Every day | ORAL | Status: DC
  Administered 2015-10-07 – 2015-10-10 (×4): 40 mg via ORAL
  Filled 2015-10-06 (×4): qty 1

## 2015-10-06 MED ORDER — HYDROMORPHONE HCL 1 MG/ML IJ SOLN
0.5000 mg | INTRAMUSCULAR | Status: DC | PRN
  Administered 2015-10-06: 0.5 mg via INTRAVENOUS
  Filled 2015-10-06: qty 1

## 2015-10-06 MED ORDER — DIPHENHYDRAMINE HCL 25 MG PO CAPS
25.0000 mg | ORAL_CAPSULE | Freq: Four times a day (QID) | ORAL | Status: DC | PRN
  Administered 2015-10-06: 25 mg via ORAL
  Filled 2015-10-06: qty 1

## 2015-10-06 MED ORDER — GABAPENTIN 300 MG PO CAPS
900.0000 mg | ORAL_CAPSULE | Freq: Three times a day (TID) | ORAL | Status: DC
  Administered 2015-10-06 – 2015-10-10 (×12): 900 mg via ORAL
  Filled 2015-10-06 (×12): qty 3

## 2015-10-06 MED ORDER — LEVOTHYROXINE SODIUM 50 MCG PO TABS
50.0000 ug | ORAL_TABLET | Freq: Every day | ORAL | Status: DC
  Administered 2015-10-06 – 2015-10-10 (×4): 50 ug via ORAL
  Filled 2015-10-06 (×4): qty 1

## 2015-10-06 MED ORDER — HYDROCODONE-ACETAMINOPHEN 7.5-325 MG PO TABS
2.0000 | ORAL_TABLET | Freq: Four times a day (QID) | ORAL | Status: DC | PRN
  Administered 2015-10-06 – 2015-10-07 (×4): 2 via ORAL
  Filled 2015-10-06 (×4): qty 2

## 2015-10-06 MED ORDER — HYDROMORPHONE 1 MG/ML IV SOLN
INTRAVENOUS | Status: DC
  Filled 2015-10-06: qty 25

## 2015-10-06 MED ORDER — SODIUM CHLORIDE 0.9 % IJ SOLN: 9.0000 mL | INTRAMUSCULAR | Status: DC | PRN

## 2015-10-06 MED ORDER — DICYCLOMINE HCL 10 MG PO CAPS
10.0000 mg | ORAL_CAPSULE | Freq: Four times a day (QID) | ORAL | Status: DC
  Administered 2015-10-06 – 2015-10-10 (×13): 10 mg via ORAL
  Filled 2015-10-06 (×13): qty 1

## 2015-10-06 MED ORDER — VITAMIN B-1 100 MG PO TABS
50.0000 mg | ORAL_TABLET | Freq: Every day | ORAL | Status: DC
  Administered 2015-10-08 – 2015-10-10 (×3): 50 mg via ORAL
  Filled 2015-10-06 (×3): qty 1

## 2015-10-06 MED ORDER — AMITRIPTYLINE HCL 25 MG PO TABS
25.0000 mg | ORAL_TABLET | Freq: Every day | ORAL | Status: DC
  Administered 2015-10-06 – 2015-10-07 (×2): 25 mg via ORAL
  Filled 2015-10-06 (×2): qty 1

## 2015-10-06 MED ORDER — DIPHENHYDRAMINE HCL 12.5 MG/5ML PO ELIX: 12.5000 mg | ORAL_SOLUTION | Freq: Four times a day (QID) | ORAL | Status: DC | PRN

## 2015-10-06 MED ORDER — FOLIC ACID 1 MG PO TABS
1.0000 mg | ORAL_TABLET | Freq: Every day | ORAL | Status: DC
  Administered 2015-10-06 – 2015-10-10 (×4): 1 mg via ORAL
  Filled 2015-10-06 (×4): qty 1

## 2015-10-06 MED ORDER — SODIUM CHLORIDE 0.9 % IJ SOLN: 3.0000 mL | INTRAMUSCULAR | Status: DC | PRN

## 2015-10-06 MED ORDER — KETOROLAC TROMETHAMINE 30 MG/ML IJ SOLN: 30.0000 mg | Freq: Four times a day (QID) | INTRAMUSCULAR | Status: DC | PRN

## 2015-10-06 MED ORDER — SODIUM CHLORIDE 0.9 % IV SOLN: 250.0000 mL | INTRAVENOUS | Status: DC | PRN

## 2015-10-06 MED ORDER — HEPARIN SODIUM (PORCINE) 5000 UNIT/ML IJ SOLN: 5000.0000 [IU] | Freq: Two times a day (BID) | INTRAMUSCULAR | Status: DC

## 2015-10-06 MED ORDER — HYDROMORPHONE HCL 1 MG/ML IJ SOLN
1.0000 mg | Freq: Once | INTRAMUSCULAR | Status: AC
  Administered 2015-10-06: 1 mg via INTRAVENOUS

## 2015-10-06 MED ORDER — ONDANSETRON HCL 4 MG PO TABS: 4.0000 mg | ORAL_TABLET | Freq: Four times a day (QID) | ORAL | Status: DC | PRN

## 2015-10-06 MED ORDER — HYDROMORPHONE HCL 1 MG/ML IJ SOLN
1.0000 mg | Freq: Once | INTRAMUSCULAR | Status: DC
  Filled 2015-10-06: qty 1

## 2015-10-06 MED ORDER — DIPHENHYDRAMINE HCL 50 MG/ML IJ SOLN
12.5000 mg | Freq: Four times a day (QID) | INTRAMUSCULAR | Status: DC | PRN
Start: 2015-10-06 — End: 2015-10-06

## 2015-10-06 NOTE — Progress Notes (Signed)
Orthopedic Tech Progress Note Patient Details:  Jamie Allen [birth year] [id number]  Ortho Devices Type of Ortho Device: Ace wrap, Post (short leg) splint, Stirrup splint Ortho Device/Splint Interventions: Application   [name] 10/06/2015, 2:33 PM

## 2015-10-06 NOTE — ED Notes (Signed)
Pt.l stated, I was told to put some weight on my left ankle where I hurt it and so I was walking and tripped over my boot. Im having rt. Ankle pain.
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
## 2015-10-06 NOTE — ED Notes (Signed)
Report attempted 

## 2015-10-07 ENCOUNTER — Encounter (HOSPITAL_COMMUNITY): Payer: Self-pay | Admitting: Anesthesiology

## 2015-10-07 ENCOUNTER — Inpatient Hospital Stay (HOSPITAL_COMMUNITY): Payer: No Typology Code available for payment source | Admitting: Anesthesiology

## 2015-10-07 ENCOUNTER — Inpatient Hospital Stay (HOSPITAL_COMMUNITY): Payer: No Typology Code available for payment source

## 2015-10-07 ENCOUNTER — Encounter (HOSPITAL_COMMUNITY)
Admission: EM | Disposition: A | Discharge status: Skilled Nursing Facility | Payer: Self-pay | Source: Home / Self Care | Attending: Family Medicine

## 2015-10-07 HISTORY — PX: ORIF ANKLE FRACTURE: SHX5408

## 2015-10-07 LAB — COMPREHENSIVE METABOLIC PANEL
ALBUMIN: 2.5 g/dL — AB (ref 3.5–5.0)
ALK PHOS: 129 U/L — AB (ref 38–126)
ALT: 16 U/L (ref 14–54)
ANION GAP: 7 (ref 5–15)
AST: 27 U/L (ref 15–41)
BILIRUBIN TOTAL: 0.6 mg/dL (ref 0.3–1.2)
BUN: 8 mg/dL (ref 6–20)
CALCIUM: 9.1 mg/dL (ref 8.9–10.3)
CO2: 24 mmol/L (ref 22–32)
Chloride: 108 mmol/L (ref 101–111)
Creatinine, Ser: 0.9 mg/dL (ref 0.44–1.00)
GFR calc non Af Amer: >60 mL/min (ref >60)
GLUCOSE: 89 mg/dL (ref 65–99)
Potassium: 3.6 mmol/L (ref 3.5–5.1)
Sodium: 139 mmol/L (ref 135–145)
TOTAL PROTEIN: 5.7 g/dL — AB (ref 6.5–8.1)

## 2015-10-07 LAB — CBC
HEMATOCRIT: 33.7 % — AB (ref 36.0–46.0)
HEMOGLOBIN: 11.2 g/dL — AB (ref 12.0–15.0)
MCH: 34.7 pg — AB (ref 26.0–34.0)
MCHC: 33.2 g/dL (ref 30.0–36.0)
MCV: 104.3 fL — AB (ref 78.0–100.0)
Platelets: 197 10*3/uL (ref 150–400)
RBC: 3.23 MIL/uL — ABNORMAL LOW (ref 3.87–5.11)
RDW: 15.5 % (ref 11.5–15.5)
WBC: 4.7 10*3/uL (ref 4.0–10.5)

## 2015-10-07 SURGERY — OPEN REDUCTION INTERNAL FIXATION (ORIF) ANKLE FRACTURE
Anesthesia: Regional | Laterality: Right

## 2015-10-07 MED ORDER — SCOPOLAMINE 1 MG/3DAYS TD PT72
MEDICATED_PATCH | TRANSDERMAL | Status: AC
  Filled 2015-10-07: qty 1

## 2015-10-07 MED ORDER — HYDROCODONE-ACETAMINOPHEN 7.5-325 MG PO TABS
2.0000 | ORAL_TABLET | ORAL | Status: DC | PRN
  Administered 2015-10-07 – 2015-10-10 (×15): 2 via ORAL
  Filled 2015-10-07 (×15): qty 2

## 2015-10-07 MED ORDER — PROPOFOL 10 MG/ML IV BOLUS
INTRAVENOUS | Status: DC | PRN
  Administered 2015-10-07: 160 mg via INTRAVENOUS

## 2015-10-07 MED ORDER — CEFAZOLIN SODIUM-DEXTROSE 2-3 GM-% IV SOLR
INTRAVENOUS | Status: AC
  Filled 2015-10-07: qty 50

## 2015-10-07 MED ORDER — CEFAZOLIN SODIUM-DEXTROSE 2-3 GM-% IV SOLR
INTRAVENOUS | Status: DC | PRN
  Administered 2015-10-07: 2 g via INTRAVENOUS

## 2015-10-07 MED ORDER — PHENYLEPHRINE 40 MCG/ML (10ML) SYRINGE FOR IV PUSH (FOR BLOOD PRESSURE SUPPORT)
PREFILLED_SYRINGE | INTRAVENOUS | Status: AC
  Filled 2015-10-07: qty 10

## 2015-10-07 MED ORDER — ONDANSETRON HCL 4 MG/2ML IJ SOLN
INTRAMUSCULAR | Status: AC
  Filled 2015-10-07: qty 2

## 2015-10-07 MED ORDER — HYDROMORPHONE HCL 1 MG/ML IJ SOLN
INTRAMUSCULAR | Status: AC
  Administered 2015-10-07: 0.5 mg via INTRAVENOUS
  Filled 2015-10-07: qty 1

## 2015-10-07 MED ORDER — PROMETHAZINE HCL 25 MG/ML IJ SOLN: 6.2500 mg | INTRAMUSCULAR | Status: DC | PRN

## 2015-10-07 MED ORDER — DEXAMETHASONE SODIUM PHOSPHATE 4 MG/ML IJ SOLN
INTRAMUSCULAR | Status: DC | PRN
Start: 2015-10-07 — End: 2015-10-07
  Administered 2015-10-07: 8 mg via INTRAVENOUS

## 2015-10-07 MED ORDER — ONDANSETRON HCL 4 MG/2ML IJ SOLN
INTRAMUSCULAR | Status: DC | PRN
Start: 2015-10-07 — End: 2015-10-07
  Administered 2015-10-07: 4 mg via INTRAVENOUS

## 2015-10-07 MED ORDER — BUPIVACAINE HCL (PF) 0.5 % IJ SOLN
INTRAMUSCULAR | Status: DC | PRN
Start: 2015-10-07 — End: 2015-10-07
  Administered 2015-10-07: 50 mL via PERINEURAL

## 2015-10-07 MED ORDER — MEPERIDINE HCL 25 MG/ML IJ SOLN: 6.2500 mg | INTRAMUSCULAR | Status: DC | PRN

## 2015-10-07 MED ORDER — MIDAZOLAM HCL 2 MG/2ML IJ SOLN
INTRAMUSCULAR | Status: AC
  Filled 2015-10-07: qty 2

## 2015-10-07 MED ORDER — LIDOCAINE HCL (CARDIAC) 20 MG/ML IV SOLN
INTRAVENOUS | Status: AC
  Filled 2015-10-07: qty 10

## 2015-10-07 MED ORDER — HYDROMORPHONE HCL 1 MG/ML IJ SOLN
0.2500 mg | INTRAMUSCULAR | Status: DC | PRN
  Administered 2015-10-07 (×2): 0.5 mg via INTRAVENOUS

## 2015-10-07 MED ORDER — DEXAMETHASONE SODIUM PHOSPHATE 4 MG/ML IJ SOLN
INTRAMUSCULAR | Status: AC
  Filled 2015-10-07: qty 2

## 2015-10-07 MED ORDER — PHENYLEPHRINE HCL 10 MG/ML IJ SOLN
INTRAMUSCULAR | Status: DC | PRN
  Administered 2015-10-07 (×2): 40 ug via INTRAVENOUS

## 2015-10-07 MED ORDER — KETAMINE HCL 100 MG/ML IJ SOLN
INTRAMUSCULAR | Status: AC
  Filled 2015-10-07: qty 1

## 2015-10-07 MED ORDER — LACTATED RINGERS IV SOLN
INTRAVENOUS | Status: DC | PRN
  Administered 2015-10-07 (×2): via INTRAVENOUS

## 2015-10-07 MED ORDER — LORAZEPAM 0.5 MG PO TABS
0.5000 mg | ORAL_TABLET | ORAL | Status: DC | PRN
  Administered 2015-10-07 – 2015-10-10 (×15): 0.5 mg via ORAL
  Filled 2015-10-07 (×15): qty 1

## 2015-10-07 MED ORDER — FENTANYL CITRATE (PF) 250 MCG/5ML IJ SOLN
INTRAMUSCULAR | Status: AC
  Filled 2015-10-07: qty 5

## 2015-10-07 MED ORDER — MIDAZOLAM HCL 5 MG/5ML IJ SOLN
INTRAMUSCULAR | Status: DC | PRN
  Administered 2015-10-07: 2 mg via INTRAVENOUS

## 2015-10-07 MED ORDER — HYDROMORPHONE HCL 1 MG/ML IJ SOLN
2.0000 mg | INTRAMUSCULAR | Status: DC | PRN
  Administered 2015-10-07 – 2015-10-08 (×4): 2 mg via INTRAVENOUS
  Filled 2015-10-07 (×4): qty 2

## 2015-10-07 MED ORDER — SUCCINYLCHOLINE CHLORIDE 20 MG/ML IJ SOLN
INTRAMUSCULAR | Status: AC
  Filled 2015-10-07: qty 1

## 2015-10-07 MED ORDER — FENTANYL CITRATE (PF) 100 MCG/2ML IJ SOLN
INTRAMUSCULAR | Status: DC | PRN
  Administered 2015-10-07: 50 ug via INTRAVENOUS
  Administered 2015-10-07 (×2): 100 ug via INTRAVENOUS

## 2015-10-07 MED ORDER — PROPOFOL 10 MG/ML IV BOLUS
INTRAVENOUS | Status: AC
  Filled 2015-10-07: qty 20

## 2015-10-07 MED ORDER — LIDOCAINE HCL (CARDIAC) 20 MG/ML IV SOLN
INTRAVENOUS | Status: DC | PRN
  Administered 2015-10-07: 50 mg via INTRAVENOUS

## 2015-10-07 MED ORDER — KETAMINE HCL 10 MG/ML IJ SOLN
INTRAMUSCULAR | Status: DC | PRN
  Administered 2015-10-07: 10 mg via INTRAVENOUS
  Administered 2015-10-07: 20 mg via INTRAVENOUS
  Administered 2015-10-07: 10 mg via INTRAVENOUS
  Administered 2015-10-07 (×3): 20 mg via INTRAVENOUS

## 2015-10-07 SURGICAL SUPPLY — 63 items
BANDAGE ELASTIC 4 VELCRO ST LF (GAUZE/BANDAGES/DRESSINGS) ×3 IMPLANT
BANDAGE ELASTIC 6 VELCRO ST LF (GAUZE/BANDAGES/DRESSINGS) ×3 IMPLANT
BANDAGE ESMARK 6X9 LF (GAUZE/BANDAGES/DRESSINGS) ×1 IMPLANT
BIT DRILL 110X2.5XQCK CNCT (BIT) ×1 IMPLANT
BIT DRILL 2.5 (BIT) ×2
BIT DRL 110X2.5XQCK CNCT (BIT) ×1
BNDG ESMARK 6X9 LF (GAUZE/BANDAGES/DRESSINGS) ×3
COVER MAYO STAND STRL (DRAPES) IMPLANT
COVER SURGICAL LIGHT HANDLE (MISCELLANEOUS) ×3 IMPLANT
CUFF TOURNIQUET SINGLE 34IN LL (TOURNIQUET CUFF) ×3 IMPLANT
CUFF TOURNIQUET SINGLE 44IN (TOURNIQUET CUFF) IMPLANT
DRAPE C-ARM 42X72 X-RAY (DRAPES) ×3 IMPLANT
DRAPE INCISE IOBAN 66X45 STRL (DRAPES) IMPLANT
DRAPE PROXIMA HALF (DRAPES) IMPLANT
DRAPE U-SHAPE 47X51 STRL (DRAPES) ×3 IMPLANT
DRSG PAD ABDOMINAL 8X10 ST (GAUZE/BANDAGES/DRESSINGS) ×3 IMPLANT
DURAPREP 26ML APPLICATOR (WOUND CARE) ×3 IMPLANT
ELECT REM PT RETURN 9FT ADLT (ELECTROSURGICAL) ×3
ELECTRODE REM PT RTRN 9FT ADLT (ELECTROSURGICAL) ×1 IMPLANT
GAUZE SPONGE 4X4 12PLY STRL (GAUZE/BANDAGES/DRESSINGS) ×3 IMPLANT
GAUZE XEROFORM 5X9 LF (GAUZE/BANDAGES/DRESSINGS) ×3 IMPLANT
GLOVE BIOGEL PI IND STRL 8 (GLOVE) ×1 IMPLANT
GLOVE BIOGEL PI INDICATOR 8 (GLOVE) ×2
GLOVE ORTHO TXT STRL SZ7.5 (GLOVE) ×3 IMPLANT
GOWN STRL REUS W/ TWL LRG LVL3 (GOWN DISPOSABLE) ×1 IMPLANT
GOWN STRL REUS W/ TWL XL LVL3 (GOWN DISPOSABLE) ×1 IMPLANT
GOWN STRL REUS W/TWL 2XL LVL3 (GOWN DISPOSABLE) IMPLANT
GOWN STRL REUS W/TWL LRG LVL3 (GOWN DISPOSABLE) ×2
GOWN STRL REUS W/TWL XL LVL3 (GOWN DISPOSABLE) ×2
GUIDEWIRE PIN ORTH 6X1.6XSMTH (WIRE) ×2 IMPLANT
K-WIRE 1.6 (WIRE) ×4
KIT BASIN OR (CUSTOM PROCEDURE TRAY) ×3 IMPLANT
KIT ROOM TURNOVER OR (KITS) ×3 IMPLANT
MANIFOLD NEPTUNE II (INSTRUMENTS) IMPLANT
NS IRRIG 1000ML POUR BTL (IV SOLUTION) ×3 IMPLANT
PACK ORTHO EXTREMITY (CUSTOM PROCEDURE TRAY) ×3 IMPLANT
PAD ARMBOARD 7.5X6 YLW CONV (MISCELLANEOUS) ×6 IMPLANT
PAD CAST 4YDX4 CTTN HI CHSV (CAST SUPPLIES) ×1 IMPLANT
PADDING CAST COTTON 4X4 STRL (CAST SUPPLIES) ×2
PADDING CAST COTTON 6X4 STRL (CAST SUPPLIES) ×3 IMPLANT
PLATE 7HOLE 1/3 TUBULAR (Plate) ×3 IMPLANT
SCREW CANC 2.5XFT 12X4XST SM (Screw) ×1 IMPLANT
SCREW CANC 4.0X12 (Screw) ×2 IMPLANT
SCREW CANCELLOUS FT 4.0X14 (Screw) ×6 IMPLANT
SCREW CANN 1/3 THRD RVRS CT (Screw) ×2 IMPLANT
SCREW CANNULATED 4.0X40 (Screw) ×4 IMPLANT
SCREW CORTICAL 3.5X12 (Screw) ×9 IMPLANT
SCREW CORTICAL 3.5X14 (Screw) ×3 IMPLANT
SPLINT PLASTER CAST XFAST 5X30 (CAST SUPPLIES) ×1 IMPLANT
SPLINT PLASTER XFAST SET 5X30 (CAST SUPPLIES) ×2
SPONGE GAUZE 4X4 12PLY STER LF (GAUZE/BANDAGES/DRESSINGS) ×3 IMPLANT
SPONGE LAP 18X18 X RAY DECT (DISPOSABLE) ×3 IMPLANT
STAPLER VISISTAT 35W (STAPLE) IMPLANT
SUCTION FRAZIER TIP 10 FR DISP (SUCTIONS) ×3 IMPLANT
SUT ETHILON 3 0 PS 1 (SUTURE) ×6 IMPLANT
SUT VIC AB 2-0 CT1 27 (SUTURE) ×4
SUT VIC AB 2-0 CT1 TAPERPNT 27 (SUTURE) ×2 IMPLANT
TOWEL OR 17X24 6PK STRL BLUE (TOWEL DISPOSABLE) ×3 IMPLANT
TOWEL OR 17X26 10 PK STRL BLUE (TOWEL DISPOSABLE) ×3 IMPLANT
TUBE CONNECTING 12'X1/4 (SUCTIONS) ×1
TUBE CONNECTING 12X1/4 (SUCTIONS) ×2 IMPLANT
WATER STERILE IRR 1000ML POUR (IV SOLUTION) ×3 IMPLANT
YANKAUER SUCT BULB TIP NO VENT (SUCTIONS) IMPLANT

## 2015-10-07 NOTE — Anesthesia Postprocedure Evaluation (Signed)
Anesthesia Post Note  Patient: Jamie Allen  Procedure(s) Performed: Procedure(s) (LRB): OPEN REDUCTION INTERNAL FIXATION (ORIF)  BIMALLEOLAR ANKLE FRACTURE (Right)  Patient location during evaluation: PACU Anesthesia Type: General Level of consciousness: sedated and patient cooperative Pain management: pain level controlled Vital Signs Assessment: post-procedure vital signs reviewed and stable Respiratory status: spontaneous breathing Cardiovascular status: stable Anesthetic complications: no    Last Vitals:  Filed Vitals:   10/07/15 1010 10/07/15 1034  BP: 131/98 110/93  Pulse:  86  Temp: 36.7 C 36.6 C  Resp:  16    Last Pain:  Filed Vitals:   10/07/15 1035  PainSc: [place]

## 2015-10-07 NOTE — Interval H&P Note (Signed)
History and Physical Interval Note:  10/07/2015 7:26 AM  Jamie Allen  has presented today for surgery, with the diagnosis of ANKLE FX  The various methods of treatment have been discussed with the patient and family. After consideration of risks, benefits and other options for treatment, the patient has consented to  Procedure(s): OPEN REDUCTION INTERNAL FIXATION (ORIF)  BIMALLEOLAR ANKLE FRACTURE (Right) as a surgical intervention .  The patient's history has been reviewed, patient examined, no change in status, stable for surgery.  I have reviewed the patient's chart and labs.  Questions were answered to the patient's satisfaction.     [name] C

## 2015-10-07 NOTE — Transfer of Care (Signed)
Immediate Anesthesia Transfer of Care Note  Patient: Jamie Allen  Procedure(s) Performed: Procedure(s): OPEN REDUCTION INTERNAL FIXATION (ORIF)  BIMALLEOLAR ANKLE FRACTURE (Right)  Patient Location: PACU  Anesthesia Type:GA combined with regional for post-op pain  Level of Consciousness: awake, oriented, sedated, patient cooperative and responds to stimulation  Airway & Oxygen Therapy: Patient Spontanous Breathing and Patient connected to nasal cannula oxygen  Post-op Assessment: Report given to RN, Post -op Vital signs reviewed and stable, Patient moving all extremities and Patient moving all extremities X 4  Post vital signs: Reviewed and stable  Last Vitals:  Filed Vitals:   10/06/15 2055 10/07/15 0445  BP: 127/85 139/74  Pulse: 80 97  Temp: 36.7 C 36.6 C  Resp: 18 18    Complications: No apparent anesthesia complications
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
## 2015-10-07 NOTE — Anesthesia Procedure Notes (Addendum)
Procedure Name: LMA Insertion Date/Time: 10/07/2015 8:08 AM Performed by: Jacquiline Doe A Pre-anesthesia Checklist: Patient identified, Emergency Drugs available, Suction available, Patient being monitored and Timeout performed Patient Re-evaluated:Patient Re-evaluated prior to inductionOxygen Delivery Method: Circle system utilized Preoxygenation: Pre-oxygenation with 100% oxygen Intubation Type: IV induction Ventilation: Mask ventilation without difficulty LMA: LMA inserted LMA Size: 3.0 Tube type: Oral Number of attempts: 1 Placement Confirmation: positive ETCO2 and breath sounds checked- equal and bilateral Tube secured with: Tape Dental Injury: Teeth and Oropharynx as per pre-operative assessment     Anesthesia Regional Block:  Adductor canal block  Pre-Anesthetic Checklist: ,, timeout performed, Correct Patient, Correct Site, Correct Laterality, Correct Procedure, Correct Position, site marked, Risks and benefits discussed, Surgical consent,  Pre-op evaluation,  Post-op pain management  Laterality: Right  Prep: chloraprep       Needles:  Injection technique: Single-shot  Needle Type: Stimiplex     Needle Length: 9cm 9 cm Needle Gauge: 21 and 21 G    Additional Needles:  Procedures: ultrasound guided (picture in chart) Adductor canal block Narrative:  Injection made incrementally with aspirations every 5 mL.  Performed by: Personally  Anesthesiologist: Nolon Nations  Additional Notes: BP cuff, EKG monitors applied. Sedation begun. Artery and nerve location verified with U/S and anesthetic injected incrementally, slowly, and after negative aspirations under direct u/s guidance. Good fascial /perineural spread. Tolerated well.   Anesthesia Regional Block:  Popliteal block  Pre-Anesthetic Checklist: ,, timeout performed, Correct Patient, Correct Site, Correct Laterality, Correct Procedure, Correct Position, site marked, Risks and benefits discussed, pre-op  evaluation, post-op pain management  Laterality: Right  Prep: chloraprep       Needles:  Injection technique: Single-shot  Needle Type: Stimiplex     Needle Length: 10cm 10 cm Needle Gauge: 21 and 21 G    Additional Needles:  Procedures: ultrasound guided (picture in chart) and nerve stimulator  Motor weakness within 5 minutes. Popliteal block  Nerve Stimulator or Paresthesia:  Response: Plantar flexion/toe flexion, 0.8 mA,   Additional Responses:   Narrative:  Injection made incrementally with aspirations every 5 mL.  Performed by: Personally  Anesthesiologist: Nolon Nations  Additional Notes: Nerve located and needle positioned with direct ultrasound guidance. Good perineural spread. Patient tolerated well.

## 2015-10-07 NOTE — Brief Op Note (Signed)
10/06/2015 - 10/07/2015  9:00 AM  PATIENT:  Jamie Allen  44 y.o. female  PRE-OPERATIVE DIAGNOSIS:  RIGHT ANKLE FX  POST-OPERATIVE DIAGNOSIS:  RIGHT ANKLE FX  PROCEDURE:  Procedure(s): OPEN REDUCTION INTERNAL FIXATION (ORIF)  BIMALLEOLAR ANKLE FRACTURE (Right)  SURGEON:  Surgeon(s) and Role:    * Marybelle Killings, MD - Primary  PHYSICIAN ASSISTANT:   ASSISTANTS: none   ANESTHESIA:   regional and general  EBL:     BLOOD ADMINISTERED:none  DRAINS: none   LOCAL MEDICATIONS USED:  NONE  SPECIMEN:  No Specimen  DISPOSITION OF SPECIMEN:  N/A  COUNTS:  YES  TOURNIQUET:   Total Tourniquet Time Documented: Thigh (Right) - 20 minutes Total: Thigh (Right) - 20 minutes   DICTATION: .Other Dictation: Dictation Number [id number]  PLAN OF CARE: already IP  PATIENT DISPOSITION:  PACU - hemodynamically stable.   Delay start of Pharmacological VTE agent (>24hrs) due to surgical blood loss or risk of bleeding: yes

## 2015-10-07 NOTE — Progress Notes (Signed)
Patient upset with current pain management regimen.  Bestfriend Posey Boyer present, patient yelling loudly.  Spoke with significant other via phone.  On call MD notified twice during this shift for patient's pain.  Pain reported as 10/10 by patient.  Dose increased for Dilaudid.  Frequency for Norco increased.  See MAR for details. Will continue to monitor.

## 2015-10-07 NOTE — Anesthesia Preprocedure Evaluation (Addendum)
Anesthesia Evaluation  Patient identified by MRN, date of birth, ID band Patient awake    Reviewed: Allergy & Precautions, NPO status , Patient's Chart, lab work & pertinent test results  Airway Mallampati: II  TM Distance: >3 FB Neck ROM: Full    Dental no notable dental hx.    Pulmonary neg pulmonary ROS, Current Smoker,    Pulmonary exam normal breath sounds clear to auscultation       Cardiovascular hypertension, Normal cardiovascular exam Rhythm:Regular Rate:Normal     Neuro/Psych  Headaches, PSYCHIATRIC DISORDERS Anxiety    GI/Hepatic Neg liver ROS, GERD  ,(+) Hepatitis -  Endo/Other  negative endocrine ROS  Renal/GU negative Renal ROS     Musculoskeletal negative musculoskeletal ROS (+)   Abdominal   Peds  Hematology negative hematology ROS (+) anemia ,   Anesthesia Other Findings   Reproductive/Obstetrics negative OB ROS                           Anesthesia Physical  Anesthesia Plan  ASA: II  Anesthesia Plan: General and Regional   Post-op Pain Management: MAC Combined w/ Regional for Post-op pain   Induction: Intravenous  Airway Management Planned: LMA  Additional Equipment:   Intra-op Plan:   Post-operative Plan: Extubation in OR  Informed Consent: I have reviewed the patients History and Physical, chart, labs and discussed the procedure including the risks, benefits and alternatives for the proposed anesthesia with the patient or authorized representative who has indicated his/her understanding and acceptance.   Dental advisory given  Plan Discussed with: CRNA  Anesthesia Plan Comments:         Anesthesia Quick Evaluation

## 2015-10-07 NOTE — Op Note (Signed)
Jamie Allen, Jamie Allen                ACCOUNT NO.:  [id number]  MEDICAL RECORD NO.:  [id number]  LOCATION:  [facility]                         FACILITY:  [facility]  PHYSICIAN:  Milaya Hora C. Lorin Mercy, M.D.    DATE OF BIRTH:  1971-07-24  DATE OF PROCEDURE:  10/06/2015 DATE OF DISCHARGE:                              OPERATIVE REPORT   PREOPERATIVE DIAGNOSIS:  Right bimalleolar ankle fracture.  POSTOPERATIVE DIAGNOSIS:  Right bimalleolar ankle fracture.  PROCEDURE:  ORIF, right bimalleolar ankle fracture.  SURGEON:  Ryelan Kazee C. Lorin Mercy, M.D.  ANESTHESIA:  General plus preoperative block.  COMPLICATIONS:  None.  IMPLANTS:  Zimmer stainless steel one-third tubular 7-hole plate and 2 medial 4.0 cancellous 40 mm partially threaded medial screws.  ESTIMATED BLOOD LOSS:  Minimal.  TOURNIQUET TIME:  20 minutes.  DESCRIPTION OF PROCEDURE:  After induction of general anesthesia, orotracheal intubation, proximal thigh tourniquet, standard prepping and draping with DuraPrep after removal of the splint, time-out procedure completed, Ancef was given prophylactically.  Leg was wrapped in Esmarch, tourniquet inflated.  Lateral subperiosteal dissection on the fibula reduction held with self-retaining clamp.  Selection of 7-hole one-third tubular plate, and distal 3 screws were filled with bicortical fully threaded cancellous screws 12-14 mm, proximally bicortical screws were placed 12-14 mm.  The fracture was in good position and a narrowness did not lend itself to a lag screw from anterior posterior going from proximal to distal in the usual fashion.  Medial incision was made C-shaped.  Fracture was reduced with fibula reduced.  K-wires were drilled and 40 mm partially threaded lag screws were inserted holding the fracture anatomic.  Spot photos were taken.  Syndesmosis was stable.  The patient tolerated the procedure well.  After irrigation, tourniquet was deflated.  Standard layered closure with 2-0 Vicryl in the  subcutaneous tissue and skin staple closure.  Instrument count and needle counts were correct.     Raahil Ong C. Lorin Mercy, M.D.     MCY/MEDQ  D:  10/07/2015  T:  10/07/2015  Job:  [id number]

## 2015-10-07 NOTE — Progress Notes (Signed)
Family Medicine Teaching Service Daily Progress Note Intern Pager: [phone]  Patient name: Jamie Allen Medical record number: [id number] Date of birth: 07/27/1971 Age: 44 y.o. Gender: female  Primary Care Provider: Minerva Ends, MD Consultants: Orthopedics Code Status: Full  Pt Overview and Major Events to Date:  12/17: admit for broken right ankle 12/18: ORIF right ankle fracture  Assessment and Plan:  Jamie Allen is a 44 y.o. female presenting with Right Trimalleolar fractures. PMH is significant for recent left ankle fracture; Neuropathy; IBS; Hx of alcohol and cocaine abuse  Right trimalleolar ankle fracture - POD #0, right ankle ORIF - Pain control:   Elevate right leg  Norco 7.5 x 2 q6hrs prn  Dilaudid 1mg  q3hrs prn for break through pain  Scheduled Toradol 30 mg IV every 6 hours; monitor creatinine - Zofran when necessary - Consult PT & OT  - Consider additional workup for frequent fractures  Neuropathy. See outpatient neurology note for work up - Gabapentin 900 mg 3 times a day - Holding Lyrica. Given samples by neurologist, but reports will not be able to afford this when samples run out in one week - Elavil 25mg  qhs - Consider neurology consult - outpatient workup started  Subclinical Hypothyroidism: TSH 10.39, T4 wnl. Given significant neuropathy which can be caused by elevated TSH, this warrants treatment - Start Synthroid 50 mcg - Recommend rechecking TSH in 6 weeks  GI: IBS and nutritional deficiencies. 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   Disposition: Admit to MedSurg. Discharge home pending surgery and pain control  Subjective:  Doing well, mild ankle  pain, denies SOB. Chest pain, N/V, is tolerating liquid PO post operatively, denies abd pain  Objective: Temp:  [97.8 F (36.6 C)-98.5 F (36.9 C)] 97.9 F (36.6 C) (12/18 1034) Pulse Rate:  [80-101] 86 (12/18 1034) Resp:  [14-18] 16 (12/18 1034) BP: (104-145)/(63-100) 110/93 mmHg (12/18 1034) SpO2:  [3 %-99 %] 97 % (12/18 1034) Weight:  [132 lb (59.875 kg)] 132 lb (59.875 kg) (12/17 1113) Physical Exam: General: NAD, lying in bed Cardiovascular: RRR Respiratory: CTAB Abdomen: soft, non tender, + BS Extremities: bilateral ankle dressing c/d/i  Laboratory:  Recent Labs Lab 10/06/15 1342 10/06/15 1351 10/07/15 0554  WBC 6.4  --  4.7  HGB 11.9* 12.9 11.2*  HCT 36.1 38.0 33.7*  PLT 283  --  197    Recent Labs Lab 10/02/15 1455 10/06/15 1351 10/06/15 1637 10/07/15 0554  NA  --  140  --  139  K  --  4.1  --  3.6  CL  --  105  --  108  CO2  --   --   --  24  BUN  --  10  --  8  CREATININE  --  1.00  --  0.90  CALCIUM  --   --   --  9.1  PROT 6.7  --  6.3* 5.7*  BILITOT  --   --  0.2* 0.6  ALKPHOS  --   --  128* 129*  ALT  --   --  18 16  AST  --   --  29 27  GLUCOSE  --  102*  --  [place], MD 10/07/2015, 10:36 AM PGY-2, [place] Intern pager: [phone], text pages welcome

## 2015-10-08 ENCOUNTER — Encounter (HOSPITAL_COMMUNITY): Payer: Self-pay | Admitting: Orthopaedic Surgery

## 2015-10-08 DIAGNOSIS — S92901D Unspecified fracture of right foot, subsequent encounter for fracture with routine healing: Secondary | ICD-10-CM

## 2015-10-08 DIAGNOSIS — S82841A Displaced bimalleolar fracture of right lower leg, initial encounter for closed fracture: Secondary | ICD-10-CM

## 2015-10-08 LAB — BASIC METABOLIC PANEL
Anion gap: 10 (ref 5–15)
BUN: 8 mg/dL (ref 6–20)
CHLORIDE: 108 mmol/L (ref 101–111)
CO2: 21 mmol/L — ABNORMAL LOW (ref 22–32)
CREATININE: 0.87 mg/dL (ref 0.44–1.00)
Calcium: 9.3 mg/dL (ref 8.9–10.3)
GFR calc non Af Amer: >60 mL/min (ref >60)
Glucose, Bld: 109 mg/dL — ABNORMAL HIGH (ref 65–99)
Potassium: 3.3 mmol/L — ABNORMAL LOW (ref 3.5–5.1)
SODIUM: 139 mmol/L (ref 135–145)

## 2015-10-08 LAB — FUNGUS STAIN

## 2015-10-08 LAB — PARATHYROID HORMONE, INTACT (NO CA): PTH: 23 pg/mL (ref 15–65)

## 2015-10-08 LAB — VITAMIN D 25 HYDROXY (VIT D DEFICIENCY, FRACTURES): VIT D 25 HYDROXY: 22.4 ng/mL — AB (ref 30.0–100.0)

## 2015-10-08 MED ORDER — CEFAZOLIN SODIUM-DEXTROSE 2-3 GM-% IV SOLR: 2.0000 g | INTRAVENOUS | Status: DC

## 2015-10-08 MED ORDER — AMITRIPTYLINE HCL 50 MG PO TABS
50.0000 mg | ORAL_TABLET | Freq: Every day | ORAL | Status: DC
  Administered 2015-10-08 – 2015-10-09 (×2): 50 mg via ORAL
  Filled 2015-10-08 (×2): qty 1

## 2015-10-08 NOTE — Progress Notes (Signed)
Utilization review completed.  

## 2015-10-08 NOTE — Discharge Summary (Signed)
[facility] Discharge Summary  Patient name: Jamie Allen Medical record number: [id number] Date of birth: 1971/01/16 Age: 44 y.o. Gender: female Date of Admission: 10/06/2015  Date of Discharge: 10/10/2015  Admitting Physician: Kinnie Feil, MD  Primary Care Provider: Minerva Ends, MD Consultants: Ortho   Indication for Hospitalization: Right Ankle Fracture s/p ORIF   Discharge Diagnoses/Problem List:  Patient Active Problem List   Diagnosis Date Noted  . Bimalleolar fracture of right ankle 10/06/2015  . Foot fracture 10/06/2015  . Abnormality of gait 10/02/2015  . Paresthesia 10/02/2015  . Ankle fracture, left   . Neuropathy ([place])   . Weakness   . Intractable pain 09/23/2015  . Ankle fracture 09/23/2015  . Hepatic cirrhosis ([place])   . Vitamin D deficiency 08/28/2015  . Neuropathic pain, leg, bilateral 08/27/2015  . IBS (irritable bowel syndrome) 08/27/2015  . S/P alcohol detoxification 06/11/2014  . Alcohol dependence ([place]) 06/09/2014  . Nonalcoholic steatohepatitis (NASH) 05/30/2014  . Unspecified constipation 05/30/2014  . Other and unspecified ovarian cyst 05/30/2014  . Anxiety and depression 04/19/2014  . Hypokalemia 04/19/2014  . C. difficile diarrhea 02/02/2014  . Cirrhosis of liver without mention of alcohol 07/21/2013    Disposition: SNF  Discharge Condition: Stable   Discharge Exam:  General: NAD, lying in bed Cardiovascular: RRR Respiratory: CTAB Abdomen: soft, non tender, + BS Extremities: bilateral ankle dressing c/d/i MSK: Able to wiggle toes bilaterally. Grip strength 5/5 bilaterally.   Brief Hospital Course:  Jamie Allen is a 44 y.o. female with PMH of IBS, anxiety, depression, cocaine abuse, and recent left ankle fracture who presented with right ankle fall s/p fall. Right ankle xray showed trimalleolar fracture. Ortho consulted and performed right ankle ORIF.   Patient has history of  neuropathy and is being followed by outpatient neurology.  Found to have folate deficiency in Nov 2016 and is currently taking folate and B12 supplements. Additionally found to have subclinical hypothyroidism with TSH 10.39 and T4 WNL. Started on Synthroid 30mg due to significant neuropathy. MRI brain and C-spine negative from previous hospitalization for demyelinating disease or mass. MRI thoracic and lumbar spine performed by outpatient neurology were also negative. Patient has had low Vit D levels in the past and has been taking a Vit D supplement. Vit D at this hospitalization low to 22.4. PTH ordered to evaluate as potential cause of neuropathy; this was within normal limits. Patient has a history of EtoH abuse which could be contributing to her neuropathy as well. She was also tested for Lyme disease; results were not back at time of discharge.   Given her recent two ankle fractures, PT and OT evaluated patient and felt she required 24 hour supervision/assistance. Recommended SNF placement.   Issues for Follow Up:  1. TSH elevated to 10.39 and started on Synthroid 50 mcg given patient's h/o neuropathy. Recommend repeat TSH in 6 weeks.  2. Follow up Vit D levels  3. Consider DEXA scan to evaluate for osteoporosis given 2 recent fractures from ground level falls.  4. Elavil increased to 50 mg qhs. Consider increasing further if patient tolerating medication well.  5. F/u B. burgdorfi antibodies  6. Monitor K outpatient  7. Ongoing PT/OT services as needed  Significant Procedures: Right Ankle ORIF   Significant Labs and Imaging:   Recent Labs Lab 10/06/15 1342 10/06/15 1351 10/07/15 0554 10/09/15 0712  WBC 6.4  --  4.7 4.6  HGB 11.9* 12.9 11.2* 10.7*  HCT 36.1 38.0  33.7* 32.7*  PLT 283  --  197 224    Recent Labs Lab 10/06/15 1351 10/06/15 1637 10/06/15 2120 10/07/15 0554 10/08/15 0925 10/09/15 0712  NA 140  --   --  139 139 141  K 4.1  --   --  3.6 3.3* 3.2*  CL 105  --    --  108 108 109  CO2  --   --   --  24 21* 21*  GLUCOSE 102*  --   --  89 109* 91  BUN 10  --   --  8 8 7   CREATININE 1.00  --   --  0.90 0.87 0.77  CALCIUM  --   --   --  9.1 9.3 9.3  MG  --   --  1.6*  --   --   --   PHOS  --   --  6.3*  --   --   --   ALKPHOS  --  128*  --  129*  --   --   AST  --  29  --  27  --   --   ALT  --  18  --  16  --   --   ALBUMIN  --  2.8*  --  2.5*  --   --    Vit D 22.4  PTH 23    Dg Ankle Complete Right  10/07/2015  CLINICAL DATA:  ORIF RIGHT ankle fracture EXAM: DG C-ARM 61-120 MIN; RIGHT ANKLE - COMPLETE 3+ VIEW COMPARISON:  10/06/2015 FLUOROSCOPY TIME:  0 minutes 11.5 seconds Images obtained: 3 FINDINGS: Two cannulated screws have been placed across to reduced medial malleolar fracture. Lateral plate and 7 screws have been placed across a reduced lateral malleolar fracture. Nondisplaced posterior malleolar fracture fragment again identified. Bones demineralized. Minimal asymmetric widening of the medial ankle mortise versus lateral. No additional fracture seen. IMPRESSION: Post ORIF of medial and lateral malleolar fractures. Nondisplaced posterior malleolar fracture. Electronically Signed   By: Lavonia Dana M.D.   On: 10/07/2015 10:35   Dg Ankle Complete Right  10/06/2015  CLINICAL DATA:  Fall with pain and swelling of right ankle. EXAM: RIGHT ANKLE - COMPLETE 3+ VIEW COMPARISON:  None. FINDINGS: Acute bimalleolar fracture involving lateral and medial malleoli present. Medial malleolus fracture demonstrates approximately 2-3 mm of displacement. The oblique distal fibular fracture is relatively nondisplaced. No posterior malleolar component identified. Diffuse soft tissue swelling is seen. The ankle mortise shows relatively normal alignment with no visible fracture of the talar dome. IMPRESSION: Acute bimalleolar right ankle fracture with mildly displaced medial malleolar fracture and relatively nondisplaced oblique distal fibular fracture. Electronically  Signed   By: Aletta Edouard M.D.   On: 10/06/2015 12:19   Dg Foot Complete Right  10/06/2015  CLINICAL DATA:  Tripped over her walker 1 day ago, recent LEFT ankle surgery, pain and swelling RIGHT ankle EXAM: RIGHT FOOT COMPLETE - 3+ VIEW COMPARISON:  None FINDINGS: Joint spaces preserved. Bones appear demineralized. Mildly distracted transverse medial malleolar fracture. Minimally displaced oblique distal RIGHT fibular fracture. Minimally displaced posterior malleolar fracture fragment. No additional fracture, dislocation, or bone destruction. Soft tissue swelling at ankle. IMPRESSION: Trimalleolar fractures RIGHT ankle as above. Electronically Signed   By: Lavonia Dana M.D.   On: 10/06/2015 12:19   Dg C-arm 1-60 Min  10/07/2015  CLINICAL DATA:  ORIF RIGHT ankle fracture EXAM: DG C-ARM 61-120 MIN; RIGHT ANKLE - COMPLETE 3+ VIEW COMPARISON:  10/06/2015 FLUOROSCOPY  TIME:  0 minutes 11.5 seconds Images obtained: 3 FINDINGS: Two cannulated screws have been placed across to reduced medial malleolar fracture. Lateral plate and 7 screws have been placed across a reduced lateral malleolar fracture. Nondisplaced posterior malleolar fracture fragment again identified. Bones demineralized. Minimal asymmetric widening of the medial ankle mortise versus lateral. No additional fracture seen. IMPRESSION: Post ORIF of medial and lateral malleolar fractures. Nondisplaced posterior malleolar fracture. Electronically Signed   By: Lavonia Dana M.D.   On: 10/07/2015 10:35    Results/Tests Pending at Time of Discharge: B. Burgdorfi antibodies  Discharge Medications:    Medication List    STOP taking these medications        pregabalin 150 MG capsule  Commonly known as:  LYRICA      TAKE these medications        amitriptyline 50 MG tablet  Commonly known as:  ELAVIL  Take 1 tablet (50 mg total) by mouth at bedtime.     aspirin 325 MG EC tablet  Take 1 tablet (325 mg total) by mouth daily.     dicyclomine 10  MG capsule  Commonly known as:  BENTYL  Take 10 mg by mouth 4 (four) times daily.     esomeprazole 40 MG capsule  Commonly known as:  NEXIUM  Take 1 capsule (40 mg total) by mouth daily. For acid reflux     folic acid 1 MG tablet  Commonly known as:  FOLVITE  Take 1 tablet (1 mg total) by mouth daily.     gabapentin 300 MG capsule  Commonly known as:  NEURONTIN  Take 3 capsules (900 mg total) by mouth 3 (three) times daily.     HYDROcodone-acetaminophen 7.5-325 MG tablet  Commonly known as:  NORCO  Take 1-2 tablets by mouth every 4 (four) hours as needed for severe pain.     ibuprofen 200 MG tablet  Commonly known as:  ADVIL,MOTRIN  Take 800 mg by mouth every 6 (six) hours as needed for moderate pain.     levothyroxine 50 MCG tablet  Commonly known as:  SYNTHROID, LEVOTHROID  Take 1 tablet (50 mcg total) by mouth daily before breakfast.     multivitamin tablet  Take 1 tablet by mouth daily.     ondansetron 4 MG tablet  Commonly known as:  ZOFRAN  Take 1 tablet (4 mg total) by mouth every 8 (eight) hours as needed for nausea or vomiting.     potassium chloride SA 20 MEQ tablet  Commonly known as:  K-DUR,KLOR-CON  Take 2 tablets (40 mEq total) by mouth daily.     thiamine 50 MG tablet  Commonly known as:  VITAMIN B-1  Take 1 tablet (50 mg total) by mouth daily.     Vitamin D (Ergocalciferol) 50000 UNITS Caps capsule  Commonly known as:  DRISDOL  Take 1 capsule (50,000 Units total) by mouth every 7 (seven) days. For 8 weeks        Discharge Instructions: Please refer to Patient Instructions section of EMR for full details.  Patient was counseled important signs and symptoms that should prompt return to medical care, changes in medications, dietary instructions, activity restrictions, and follow up appointments.   Follow-Up Appointments: Follow-up Information    Follow up with Minerva Ends, MD. Schedule an appointment as soon as possible for a visit in 1 week.    Specialty:  Family Medicine   Why:  For Hospital Followup   Contact information:   [address] [place] [address] [phone]       Follow up with Marybelle Killings, MD In 1 week.   Specialty:  Orthopedic Surgery   Contact information:   [place] [address] [phone]       Completed with assistance of Dr. Phill Myron.  Virginia Crews, MD 10/10/2015, 10:12 AM PGY-2, [place]

## 2015-10-08 NOTE — NC FL2 (Signed)
[place] MEDICAID FL2 LEVEL OF CARE SCREENING TOOL     IDENTIFICATION  Patient Name: Jamie Allen Birthdate: 06-02-1971 Sex: female Admission Date (Current Location): 10/06/2015  [facility] and [place] Number:  Herbalist and Address:  [facility] [place] [place] [address] [place], [place] [address]      Provider Number: [id number]  Attending Physician Name and Address:  Kinnie Feil, MD  Relative Name and Phone Number:       Current Level of Care: Hospital Recommended Level of Care: [place] Prior Approval Number:    Date Approved/Denied:   PASRR Number: [id number]  Discharge Plan: SNF    Current Diagnoses: Patient Active Problem List   Diagnosis Date Noted  . Bimalleolar fracture of right ankle 10/06/2015  . Foot fracture 10/06/2015  . Abnormality of gait 10/02/2015  . Paresthesia 10/02/2015  . Ankle fracture, left   . Neuropathy ([place])   . Weakness   . Intractable pain 09/23/2015  . Ankle fracture 09/23/2015  . Hepatic cirrhosis ([place])   . Vitamin D deficiency 08/28/2015  . Neuropathic pain, leg, bilateral 08/27/2015  . IBS (irritable bowel syndrome) 08/27/2015  . S/P alcohol detoxification 06/11/2014  . Alcohol dependence ([place]) 06/09/2014  . Nonalcoholic steatohepatitis (NASH) 05/30/2014  . Unspecified constipation 05/30/2014  . Other and unspecified ovarian cyst 05/30/2014  . Anxiety and depression 04/19/2014  . Hypokalemia 04/19/2014  . C. difficile diarrhea 02/02/2014  . Cirrhosis of liver without mention of alcohol 07/21/2013    Orientation RESPIRATION BLADDER Height & Weight    Self, Time, Situation, Place  Normal Continent 5\' 1"  (154.9 cm) 132 lbs.  BEHAVIORAL SYMPTOMS/MOOD NEUROLOGICAL BOWEL NUTRITION STATUS  Other (Comment) (n/a)  (n/a) Continent Diet (Please see discharge summary.)  AMBULATORY STATUS COMMUNICATION OF NEEDS Skin    (NWB at this time.) Verbally Surgical wounds                       Personal Care Assistance Level of Assistance  Bathing, Feeding, Dressing Bathing Assistance: Limited assistance Feeding assistance: Independent Dressing Assistance: Limited assistance     Functional Limitations Info   (n/a)          [place]  PT (By licensed PT), OT (By licensed OT)     PT Frequency: 5 OT Frequency: 5            Contractures      Additional Factors Info  Code Status, Allergies Code Status Info: FULL Allergies Info: Iohexol, Morphine And Related, Oxycodone           Current Medications (10/08/2015):  This is the current hospital active medication list Current Facility-Administered Medications  Medication Dose Route Frequency Provider Last Rate Last Dose  . 0.9 %  sodium chloride infusion  250 mL Intravenous PRN Olam Idler, MD      . amitriptyline (ELAVIL) tablet 50 mg  50 mg Oral QHS Verner Mould, MD      . dicyclomine (BENTYL) capsule 10 mg  10 mg Oral QID Olam Idler, MD   10 mg at 10/08/15 1538  . diphenhydrAMINE (BENADRYL) capsule 25 mg  25 mg Oral Q6H PRN Marybelle Killings, MD   25 mg at 10/06/15 2130  . folic acid (FOLVITE) tablet 1 mg  1 mg Oral Daily Olam Idler, MD   1 mg at 10/08/15 1057  . gabapentin (NEURONTIN) capsule 900 mg  900 mg Oral TID  Olam Idler, MD   900 mg at 10/08/15 1538  . HYDROcodone-acetaminophen (NORCO) 7.5-325 MG per tablet 2 tablet  2 tablet Oral Q4H PRN Veatrice Bourbon, MD   2 tablet at 10/08/15 1206  . levothyroxine (SYNTHROID, LEVOTHROID) tablet 50 mcg  50 mcg Oral QAC breakfast Olam Idler, MD   50 mcg at 10/08/15 0750  . LORazepam (ATIVAN) tablet 0.5 mg  0.5 mg Oral Q4H PRN Veatrice Bourbon, MD   0.5 mg at 10/08/15 1538  . ondansetron (ZOFRAN) tablet 4 mg  4 mg Oral Q6H PRN Olam Idler, MD       Or  . ondansetron [facility]) injection 4 mg  4 mg Intravenous Q6H PRN Olam Idler, MD      . pantoprazole (PROTONIX) EC tablet 40 mg  40 mg Oral Daily Olam Idler, MD   40 mg at 10/08/15 1057  . sodium chloride 0.9 % injection 3 mL  3 mL Intravenous Q12H Olam Idler, MD   3 mL at 10/08/15 1106  . sodium chloride 0.9 % injection 3 mL  3 mL Intravenous PRN Olam Idler, MD      . thiamine (VITAMIN B-1) tablet 50 mg  50 mg Oral Daily Olam Idler, MD   50 mg at 10/08/15 1058  . Vitamin D (Ergocalciferol) (DRISDOL) capsule 50,000 Units  50,000 Units Oral Q7 days Olam Idler, MD   50,000 Units at 10/08/15 1206     Discharge Medications: Please see discharge summary for a list of discharge medications.  Relevant Imaging Results:  Relevant Lab Results:   Additional Information Social Security #: [id number]  Luna Kitchens [phone]

## 2015-10-08 NOTE — Evaluation (Signed)
Physical Therapy Evaluation [name] MRN: [id number] DOB: [birth year] Today's Date: 10/08/2015   History of Present Illness  Pt admitted with right ankle fx after fall 12/17 with ORIf Pt with additional fall with recent left ankle fx after fall 09/23/15. PMH is significant for HTN, diarrhea-predominant IBS, NASH, GERD, colitis, PTSD, gallbladder disease and history of C diff  Clinical Impression  Pt needed reminder of WB precautions on both LEs. She stated she squat pivoted by herself to the [facility] this morning. She had a difficult time avoiding WB during squat pivot transfer practice with PT but did much better with slide board transfer. She was educated on AP transfers from bed to chair, precautions, and HEP; she verbalized understanding. Pt will benefit from acute PT to improve safety with transfers and increase LE strength. D/C to SNF is most appropriate at this time.    Follow Up Recommendations SNF    Equipment Recommendations  3in1 (PT);Other (comment) (drop arm 3in 1 and sliding board)    Recommendations for Other Services       Precautions / Restrictions Precautions Precautions: Fall Restrictions Weight Bearing Restrictions: Yes RLE Weight Bearing: Non weight bearing LLE Weight Bearing: Partial weight bearing      Mobility  Bed Mobility Overal bed mobility: Modified Independent                Transfers Overall transfer level: Needs assistance   Transfers: Squat Pivot Transfers;Lateral/Scoot Transfers     Squat pivot transfers: Mod assist    Lateral/Scoot Transfers: Min guard General transfer comment: pt attempted squat pivot with cues utilizing LLE only but pt with great difficulty perfoming transfer and cues for sequencing. Pt able to perform sliding board transfers x 3 with guarding and cues for sequence, safety and hand placement  Ambulation/Gait                Stairs            Wheelchair Mobility     Modified Rankin (Stroke Patients Only)       Balance                                             Pertinent Vitals/Pain Pain Score: 6  Pain Location: right foot Pain Descriptors / Indicators: Sore Pain Intervention(s): Limited activity within patient's tolerance;Repositioned    Home Living Family/patient expects to be discharged to:: Private residence Living Arrangements: Spouse/significant other Available Help at Discharge: Family;Available PRN/intermittently Type of Home: Apartment Home Access: Stairs to enter   Entrance Stairs-Number of Steps: 14 Home Layout: One level Home Equipment: Walker - 2 wheels;Shower seat;Grab bars - tub/shower;Wheelchair - Regulatory affairs officer / Transfers Assistance Needed: assistance walking to and from bathroom, using Rw since last ankle fx  ADL's / Homemaking Assistance Needed: assistance with ADLs  Comments: at least 7 falls in the last year     Hand Dominance        Extremity/Trunk Assessment   Upper Extremity Assessment: Generalized weakness           Lower Extremity Assessment: Generalized weakness      Cervical / Trunk Assessment: Normal  Communication   Communication: No difficulties  Cognition Arousal/Alertness: Awake/alert Behavior During Therapy: WFL for tasks assessed/performed         Memory: Decreased  short-term memory   Safety/Judgement: Decreased awareness of deficits;Decreased awareness of safety   Problem Solving: Slow processing      General Comments      Exercises General Exercises - Lower Extremity Quad Sets: AROM;10 reps;Both;Seated Long Arc Quad: AROM;Seated;15 reps;Both Hip ABduction/ADduction: AROM;10 reps;Both;Seated Straight Leg Raises: AROM;10 reps;Seated;Both Hip Flexion/Marching: AROM;Seated;Both;15 reps      Assessment/Plan    PT Assessment Patient needs continued PT services  PT Diagnosis Generalized weakness;Acute pain   PT Problem  List Decreased balance;Decreased mobility;Decreased activity tolerance;Pain;Decreased safety awareness;Decreased strength;Decreased knowledge of use of DME  PT Treatment Interventions DME instruction;Gait training;Balance training;Patient/family education;Therapeutic activities;Functional mobility training;Stair training   PT Goals (Current goals can be found in the Care Plan section) Acute Rehab PT Goals Patient Stated Goal: return home PT Goal Formulation: With patient Time For Goal Achievement: 10/22/15 Potential to Achieve Goals: Good Additional Goals Additional Goal #1: Pt will perform wC mobility mod I 150'    Frequency Min 5X/week   Barriers to discharge Decreased caregiver support      Co-evaluation               End of Session Equipment Utilized During Treatment: Gait belt Activity Tolerance: Patient tolerated treatment well Patient left: in chair;with call bell/phone within reach;with chair alarm set Nurse Communication: Mobility status         Time: [phone] PT Time Calculation (min) (ACUTE ONLY): 23 min   Charges:   PT Evaluation $Initial PT Evaluation Tier I: 1 Procedure PT Treatments $Therapeutic Activity: 8-22 mins   PT G CodesHaynes Bast 10-20-2015, 9:19 AM Haynes Bast, SPT 2015/10/20 9:19 AM

## 2015-10-08 NOTE — Clinical Social Work Placement (Signed)
   CLINICAL SOCIAL WORK PLACEMENT  NOTE  Date:  10/08/2015  Patient Details  Name: Jamie Allen MRN: [id number] Date of Birth: July 12, 1971  Clinical Social Work is seeking post-discharge placement for this patient at the [place] level of care (*CSW will initial, date and re-position this form in  chart as items are completed):  Yes   Patient/family provided with [place] Work Department's list of facilities offering this level of care within the geographic area requested by the patient (or if unable, by the patient's family).  Yes   Patient/family informed of their freedom to choose among providers that offer the needed level of care, that participate in Medicare, Medicaid or managed care program needed by the patient, have an available bed and are willing to accept the patient.  Yes   Patient/family informed of [facility]'s ownership interest in [facility] and [facility], as well as of the fact that they are under no obligation to receive care at these facilities.  PASRR submitted to EDS on       PASRR number received on       Existing PASRR number confirmed on 10/08/15     FL2 transmitted to all facilities in geographic area requested by pt/family on 10/08/15     FL2 transmitted to all facilities within larger geographic area on 10/08/15     Patient informed that his/her managed care company has contracts with or will negotiate with certain facilities, including the following:            Patient/family informed of bed offers received.  Patient chooses bed at       Physician recommends and patient chooses bed at      Patient to be transferred to   on  .  Patient to be transferred to facility by       Patient family notified on   of transfer.  Name of family member notified:        PHYSICIAN Please sign FL2     Additional Comment:    _______________________________________________ Caroline Sauger,  LCSW 10/08/2015, 3:49 PM

## 2015-10-08 NOTE — Progress Notes (Signed)
[facility]  Patient Status: Active (receiving services up to time of hospitalization)  [facility] is providing the following services: PT  If patient discharges after hours, please call [phone].   Jamie Allen 10/08/2015, 3:34 PM

## 2015-10-08 NOTE — Clinical Social Work Note (Signed)
Clinical Social Work Assessment  Patient Details  Name: Jamie Allen MRN: [id number] Date of Birth: 1971-01-27  Date of referral:  10/08/15               Reason for consult:  Facility Placement, Discharge Planning                Permission sought to share information with:  Facility Sport and exercise psychologist, Family Supports Permission granted to share information::  Yes, Verbal Permission Granted  Name::     Jenene Slicker  Agency::  All SNF accepting of LOG  Relationship::  Significant Other  Contact Information:  [phone]  Housing/Transportation Living arrangements for the past 2 months:  Apartment Source of Information:  Other (Comment Required) (Significant other) Patient Interpreter Needed:  None Criminal Activity/Legal Involvement Pertinent to Current Situation/Hospitalization:  No - Comment as needed Significant Relationships:  Significant Other Lives with:  Significant Other Do you feel safe going back to the place where you live?  No (High fall risk.) Need for family participation in patient care:  Yes (Comment) (Patient deferring decision making to [facility].)  Care giving concerns:  Patient's significant other expressed concerns regarding patient's discharge plan. Patient's significant other stated concern for patient to return home at time of discharge.   Social Worker assessment / plan:  CSW received referral for possible SNF placement at time of discharge. CSW attempted to speak with patient, however, patient sleeping and patient's friend at bedside referred CSW to patient's significant other. CSW contacted patient's SO regarding discharge planning. Patient's SO agreeable to SNF placement, although SNF placement may be out of [facility]. Patient's SO informed CSW patient's SO works two jobs (day and night) while also attending [employer] full time. Patient's SO states he is unable to provide care for patient at home and fears patient may fall again if discharged home.  Patient's SO stated his appreciations for CSW attempting to find SNF placement for patient at time of discharge. CSW to continue to follow and assist with discharge planning needs.  Barrier to discharge: Patient with history of substance abuse (positive on this admission and last admission), patient with no payer source for SNF placement (approved by CSW Surveyor, quantity for LOG bed).  Employment status:  Unemployed Forensic scientist:  Self Pay (Medicaid Pending) (Per [name], patient not eligible for Medicaid.) PT Recommendations:  [place] / Referral to community resources:  [place]  Patient/Family's Response to care:  Patient's SO understanding and agreeable to CSW plan of care.  Patient/Family's Understanding of and Emotional Response to Diagnosis, Current Treatment, and Prognosis:  Patient's SO understanding and agreeable to CSW plan of care.  Emotional Assessment Appearance:  Other (Comment Required (Patient sleeping, CSW spoke with [name].) Attitude/Demeanor/Rapport:  Other (Patient sleeping, CSW spoke with [name].) Affect (typically observed):  Other (Patient sleeping, CSW spoke with [name].) Orientation:  Oriented to Self, Oriented to Place, Oriented to Situation, Oriented to  Time Alcohol / Substance use:  Not Applicable Psych involvement (Current and /or in the community):  No (Comment) (Not appropriate on this admission.)  Discharge Needs  Concerns to be addressed:  No discharge needs identified Readmission within the last 30 days:  No Current discharge risk:  None Barriers to Discharge:  No Barriers Identified   Caroline Sauger, LCSW 10/08/2015, 3:28 PM [phone]

## 2015-10-08 NOTE — Progress Notes (Signed)
Family Medicine Teaching Service Daily Progress Note Intern Pager: [phone]  Patient name: Jamie Allen Medical record number: [id number] Date of birth: 03-14-1971 Age: 44 y.o. Gender: female  Primary Care Provider: Minerva Ends, MD Consultants: Orthopedics Code Status: Full  Pt Overview and Major Events to Date:  12/17: admit for broken right ankle 12/18: ORIF right ankle fracture  Assessment and Plan:  Jamie Allen is a 44 y.o. female presenting with Right Trimalleolar fractures. PMH is significant for recent left ankle fracture; Neuropathy; IBS; Hx of alcohol and cocaine abuse  Right trimalleolar ankle fracture - POD #1, right ankle ORIF - Pain control:   Elevate right leg  Norco 7.5 x 2 q4hrs prn  Dilaudid 44m q3hrs prn for break through pain  Scheduled Toradol 30 mg IV every 6 hours; monitor creatinine - Zofran when necessary - Consult PT & OT  - Consider additional workup for frequent fractures  Neuropathy. See outpatient neurology note for work up. Mildly abnormal EMG. No evidence of Guillain-Barre or right cervical/lumbosacral radiculopathies.  - Gabapentin 900 mg 3 times a day - Holding Lyrica. Given samples by neurologist, but reports will not be able to afford this when samples run out in one week - Elavil 225mqhs - Consider neurology consult - outpatient workup started  Subclinical Hypothyroidism: TSH 10.39, T4 wnl. Given significant neuropathy which can be caused by elevated TSH, this warrants treatment - Start Synthroid 50 mcg - Recommend rechecking TSH in 6 weeks  GI: IBS and nutritional deficiencies. 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   Disposition: Admit to  MedSurg. Discharge home pending surgery and pain control  Subjective:  Doing well, mild ankle pain. Denies N/V and tolerating diet well.   Objective: Temp:  [97.9 F (36.6 C)-98.8 F (37.1 C)] 98.8 F (37.1 C) (12/19 0503) Pulse Rate:  [72-95] 84 (12/19 0503) Resp:  [14-17] 16 (12/19 0503) BP: (110-153)/(76-100) 153/84 mmHg (12/19 0503) SpO2:  [92 %-97 %] 95 % (12/19 0503) Physical Exam: General: NAD, lying in bed Cardiovascular: RRR Respiratory: CTAB Abdomen: soft, non tender, + BS Extremities: bilateral ankle dressing c/d/i MSK: Able to wiggle toes bilaterally. Grip strength 5/5 bilaterally.   Laboratory:  Recent Labs Lab 10/06/15 1342 10/06/15 1351 10/07/15 0554  WBC 6.4  --  4.7  HGB 11.9* 12.9 11.2*  HCT 36.1 38.0 33.7*  PLT 283  --  197    Recent Labs Lab 10/02/15 1455 10/06/15 1351 10/06/15 1637 10/07/15 0554  NA  --  140  --  139  K  --  4.1  --  3.6  CL  --  105  --  108  CO2  --   --   --  24  BUN  --  10  --  8  CREATININE  --  1.00  --  0.90  CALCIUM  --   --   --  9.1  PROT 6.7  --  6.3* 5.7*  BILITOT  --   --  0.2* 0.6  ALKPHOS  --   --  128* 129*  ALT  --   --  18 16  AST  --   --  29 27  GLUCOSE  --  102*  --  89   Dg Ankle Complete Right  10/07/2015  CLINICAL DATA:  ORIF RIGHT ankle fracture EXAM: DG C-ARM 61-120 MIN; RIGHT ANKLE - COMPLETE 3+ VIEW COMPARISON:  10/06/2015 FLUOROSCOPY TIME:  0 minutes 11.5 seconds Images obtained: 3 FINDINGS: Two cannulated screws have been placed across to reduced medial malleolar fracture. Lateral plate and 7 screws have been placed across a reduced lateral malleolar fracture. Nondisplaced posterior malleolar fracture fragment again identified. Bones demineralized. Minimal asymmetric widening of the medial ankle mortise versus lateral. No additional fracture seen. IMPRESSION: Post ORIF of medial and lateral malleolar fractures. Nondisplaced posterior malleolar fracture. Electronically Signed   By: Lavonia Dana M.D.   On: 10/07/2015 10:35   Dg Ankle Complete Right  10/06/2015  CLINICAL DATA:  Fall with pain and swelling of right ankle. EXAM: RIGHT ANKLE - COMPLETE 3+ VIEW COMPARISON:  None. FINDINGS: Acute bimalleolar fracture involving lateral and medial malleoli present. Medial malleolus fracture demonstrates approximately 2-3 mm of displacement. The oblique distal fibular fracture is relatively nondisplaced. No posterior malleolar component identified. Diffuse soft tissue swelling is seen. The ankle mortise shows relatively normal alignment with no visible fracture of the talar dome. IMPRESSION: Acute bimalleolar right ankle fracture with mildly displaced medial malleolar fracture and relatively nondisplaced oblique distal fibular fracture. Electronically Signed   By: Aletta Edouard M.D.   On: 10/06/2015 12:19   Dg Foot Complete Right  10/06/2015  CLINICAL DATA:  Tripped over her walker 1 day ago, recent LEFT ankle surgery, pain and swelling RIGHT ankle EXAM: RIGHT FOOT COMPLETE - 3+ VIEW COMPARISON:  None FINDINGS: Joint spaces preserved. Bones appear demineralized. Mildly distracted transverse medial malleolar fracture. Minimally displaced oblique distal RIGHT fibular fracture. Minimally displaced posterior malleolar fracture fragment. No additional fracture, dislocation, or bone destruction. Soft tissue swelling at ankle. IMPRESSION: Trimalleolar fractures RIGHT ankle as above. Electronically Signed   By: Lavonia Dana M.D.   On: 10/06/2015 12:19   Dg C-arm 1-60 Min  10/07/2015  CLINICAL DATA:  ORIF RIGHT ankle fracture EXAM: DG C-ARM 61-120 MIN; RIGHT ANKLE - COMPLETE 3+ VIEW COMPARISON:  10/06/2015 FLUOROSCOPY TIME:  0 minutes 11.5 seconds Images obtained: 3 FINDINGS: Two cannulated screws have been placed across to reduced medial malleolar fracture. Lateral plate and 7 screws have been placed across a reduced lateral malleolar fracture. Nondisplaced posterior malleolar fracture fragment again  identified. Bones demineralized. Minimal asymmetric widening of the medial ankle mortise versus lateral. No additional fracture seen. IMPRESSION: Post ORIF of medial and lateral malleolar fractures. Nondisplaced posterior malleolar fracture. Electronically Signed   By: Lavonia Dana M.D.   On: 10/07/2015 10:35    Nicolette Bang, DO 10/08/2015, 6:36 AM PGY-1, [place] Intern pager: [phone], text pages welcome

## 2015-10-08 NOTE — Progress Notes (Signed)
OT Cancellation Note  [name] MRN: [id number] DOB: 29-Mar-1971   Cancelled Treatment:    Reason Eval/Treat Not Completed: Patient declined, no reason specified;Other (comment) (attempted x 2 today). First attempt this AM; pt declined stating that she just got back in bed and was very tired, declined therapy at this time and requesting OT to check back later this afternoon. Second attempt this PM; pt sleeping, visitor reports that pt has not gotten much rest and she would like for the pt to be able to sleep. Will follow up for OT eval as time allows.  Binnie Kand M.S., OTR/L Pager: [phone]  10/08/2015, 2:25 PM

## 2015-10-08 NOTE — Care Management Note (Signed)
Case Management Note  Patient Details  Name: Jamie Allen MRN: [id number] Date of Birth: May 24, 1971  Subjective/Objective:        Admitted with right ankle fracture, s/p ORIF            Action/Plan: PT recommending SNF, referral made to [name]. Made referral to financial counselor. Spoke with patient about discharge plan, she is agreeable to SNF,states she does not have anyone to stay with her during the day. Encouraged patient to work on arranging assistance at home as backup plan. Will continue to follow patient for d/c needs.   Expected Discharge Date:                  Expected Discharge Plan:  [place]  In-House Referral:  Clinical Social Work, Scientist, research (medical)  CM Consult  Post Acute Care Choice:    Choice offered to:     DME Arranged:    DME Agency:     HH Arranged:    [place] Agency:     Status of Service:  In process, will continue to follow  Medicare Important Message Given:    Date Medicare IM Given:    Medicare IM give by:    Date Additional Medicare IM Given:    Additional Medicare Important Message give by:     If discussed at [place] of Stay Meetings, dates discussed:    Additional Comments:  Nila Nephew, RN 10/08/2015, 4:42 PM

## 2015-10-09 LAB — CBC
HEMATOCRIT: 32.7 % — AB (ref 36.0–46.0)
HEMOGLOBIN: 10.7 g/dL — AB (ref 12.0–15.0)
MCH: 33.8 pg (ref 26.0–34.0)
MCHC: 32.7 g/dL (ref 30.0–36.0)
MCV: 103.2 fL — ABNORMAL HIGH (ref 78.0–100.0)
Platelets: 224 10*3/uL (ref 150–400)
RBC: 3.17 MIL/uL — ABNORMAL LOW (ref 3.87–5.11)
RDW: 15.3 % (ref 11.5–15.5)
WBC: 4.6 10*3/uL (ref 4.0–10.5)

## 2015-10-09 LAB — BASIC METABOLIC PANEL
ANION GAP: 11 (ref 5–15)
BUN: 7 mg/dL (ref 6–20)
CALCIUM: 9.3 mg/dL (ref 8.9–10.3)
CO2: 21 mmol/L — ABNORMAL LOW (ref 22–32)
Chloride: 109 mmol/L (ref 101–111)
Creatinine, Ser: 0.77 mg/dL (ref 0.44–1.00)
GFR calc Af Amer: >60 mL/min (ref >60)
Glucose, Bld: 91 mg/dL (ref 65–99)
POTASSIUM: 3.2 mmol/L — AB (ref 3.5–5.1)
SODIUM: 141 mmol/L (ref 135–145)

## 2015-10-09 MED ORDER — POTASSIUM CHLORIDE CRYS ER 20 MEQ PO TBCR
40.0000 meq | EXTENDED_RELEASE_TABLET | Freq: Once | ORAL | Status: AC
  Administered 2015-10-09: 40 meq via ORAL
  Filled 2015-10-09: qty 2

## 2015-10-09 MED ORDER — ASPIRIN EC 325 MG PO TBEC
325.0000 mg | DELAYED_RELEASE_TABLET | Freq: Every day | ORAL | Status: DC
  Administered 2015-10-09 – 2015-10-10 (×2): 325 mg via ORAL
  Filled 2015-10-09 (×2): qty 1

## 2015-10-09 NOTE — Progress Notes (Signed)
Family Medicine Teaching Service Daily Progress Note Intern Pager: [phone]  Patient name: Jamie Allen Medical record number: [id number] Date of birth: Jun 09, 1971 Age: 44 y.o. Gender: female  Primary Care Provider: Minerva Ends, MD Consultants: Orthopedics Code Status: Full  Pt Overview and Major Events to Date:  12/17: admit for broken right ankle 12/18: ORIF right ankle fracture  Assessment and Plan:  Jamie Allen is a 44 y.o. female presenting with Right Trimalleolar fractures. PMH is significant for recent left ankle fracture; Neuropathy; IBS; Hx of alcohol and cocaine abuse  Right trimalleolar ankle fracture - POD #1, right ankle ORIF - Pain control:   Elevate right leg  Norco 7.5 x 2 q4hrs prn - Zofran when necessary - Consult PT & OT: recommending SNF  - Consider additional workup for frequent fractures  Neuropathy. See outpatient neurology note for work up. Mildly abnormal EMG. No evidence of Guillain-Barre or right cervical/lumbosacral radiculopathies.  - Gabapentin 900 mg 3 times a day - Holding Lyrica. Given samples by neurologist, but reports will not be able to afford this when samples run out in one week - Elavil 52m qhs >> increased to 50 mg qhs on 12/19 - Consider neurology consult - outpatient workup started -PTH WNL   Hypokalemia: K low to 3.2 on 12/20. -Kdur 40 mEq ordered   Subclinical Hypothyroidism: TSH 10.39, T4 wnl. Given significant neuropathy which can be caused by elevated TSH, this warrants treatment - Start Synthroid 50 mcg - Recommend rechecking TSH in 6 weeks  GI: IBS and nutritional deficiencies. 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    Disposition: Pending Placement ?SNF  Subjective:  Doing well, mild ankle pain. Denies N/V and tolerating diet well.   Objective: Temp:  [98.1 F (36.7 C)-99 F (37.2 C)] 99 F (37.2 C) (12/20 0438) Pulse Rate:  [80-90] 90 (12/20 0438) Resp:  [16-18] 18 (12/20 0438) BP: (142-150)/(87-93) 142/87 mmHg (12/20 0438) SpO2:  [92 %-100 %] 92 % (12/20 0438) Physical Exam: General: NAD, lying in bed Cardiovascular: RRR Respiratory: CTAB Abdomen: soft, non tender, + BS Extremities: bilateral ankle dressing c/d/i MSK: Able to wiggle toes bilaterally. Grip strength 5/5 bilaterally.   Laboratory:  Recent Labs Lab 10/06/15 1342 10/06/15 1351 10/07/15 0554 10/09/15 0712  WBC 6.4  --  4.7 4.6  HGB 11.9* 12.9 11.2* 10.7*  HCT 36.1 38.0 33.7* 32.7*  PLT 283  --  197 224    Recent Labs Lab 10/02/15 1455  10/06/15 1637 10/07/15 0554 10/08/15 0925 10/09/15 0712  NA  --   < >  --  139 139 141  K  --   < >  --  3.6 3.3* 3.2*  CL  --   < >  --  108 108 109  CO2  --   --   --  24 21* 21*  BUN  --   < >  --  8 8 7   CREATININE  --   < >  --  0.90 0.87 0.77  CALCIUM  --   --   --  9.1 9.3 9.3  PROT 6.7  --  6.3* 5.7*  --   --  BILITOT  --   --  0.2* 0.6  --   --   ALKPHOS  --   --  128* 129*  --   --   ALT  --   --  18 16  --   --   AST  --   --  29 27  --   --   GLUCOSE  --   < >  --  89 109* 91  < > = values in this interval not displayed. No results found.  Nicolette Bang, DO 10/09/2015, 9:19 AM PGY-1, [place] Intern pager: [phone], text pages welcome

## 2015-10-09 NOTE — Evaluation (Signed)
Occupational Therapy Evaluation [name] MRN: [id number] DOB: 13-Nov-1970 Today's Date: 10/09/2015    History of Present Illness Pt admitted with right ankle fx after fall 12/17 with ORIf Pt with additional fall with recent left ankle fx after fall 09/23/15. PMH is significant for HTN, diarrhea-predominant IBS, NASH, GERD, colitis, PTSD, gallbladder disease and history of C diff   Clinical Impression   Pt resistant to participating in therapy this PM due to pain and "feeling sick"; max verbal encouragement given and pt agreed to participate. Pt reports she was independent with ADLs PTA. Currently pt is overall min assist for ADLs and min guard for lateral scoot transfers using a sliding board. Pt with difficulty maintaining PWB status on LLE during functional transfers despite max verbal cues. At this time, strongly recommending SNF for further rehab prior to returning home in order to maximize independence and safety with ADLs and functional mobility. Pt would benefit from continued skilled OT services to increase independence and safety with LB ADLs and toilet transfers while maintaining LE WB status.     Follow Up Recommendations  SNF;Supervision/Assistance - 24 hour    Equipment Recommendations  None recommended by OT    Recommendations for Other Services       Precautions / Restrictions Precautions Precautions: Fall Restrictions Weight Bearing Restrictions: Yes RLE Weight Bearing: Non weight bearing LLE Weight Bearing: Partial weight bearing (awaiting orders)      Mobility Bed Mobility Overal bed mobility: Modified Independent                Transfers Overall transfer level: Needs assistance   Transfers: Lateral/Scoot Transfers          Lateral/Scoot Transfers: Min guard General transfer comment: Min guard for slide board transfer EOB <> chair    Balance Overall balance assessment: Needs assistance Sitting-balance support:  Bilateral upper extremity supported Sitting balance-Leahy Scale: Good                                      ADL Overall ADL's : Needs assistance/impaired Eating/Feeding: Set up;Sitting   Grooming: Set up;Sitting       Lower Body Bathing: Minimal assistance;Sitting/lateral leans       Lower Body Dressing: Minimal assistance;Sitting/lateral leans   Toilet Transfer: Min guard;BSC (sliding board transfer) Toilet Transfer Details (indicate cue type and reason): Simulated with transfer EOB <> chair. VC for hand placement and technique. Pt with difficulty maintaining PWB status on LLE. Toileting- Water quality scientist and Hygiene: Min guard;Sitting/lateral lean         General ADL Comments: Pts friend present for OT eval. Pt declining OT eval at first, verbal encouragement needed to participate. Educated pt on precautions and WB status for LEs; pt with difficulty maintaining WB status on LLE during functional transfer. Educated on safety and need for supervision.     Vision     Perception     Praxis      Pertinent Vitals/Pain Pain Assessment: Faces Faces Pain Scale: Hurts even more Pain Location: R foot Pain Descriptors / Indicators: Aching (+ "sick" ) Pain Intervention(s): Limited activity within patient's tolerance;Monitored during session;Repositioned     Hand Dominance     Extremity/Trunk Assessment Upper Extremity Assessment Upper Extremity Assessment: Generalized weakness   Lower Extremity Assessment Lower Extremity Assessment: Defer to PT evaluation   Cervical / Trunk Assessment Cervical / Trunk Assessment: Normal  Communication Communication Communication: No difficulties   Cognition Arousal/Alertness: Awake/alert Behavior During Therapy: Impulsive         Memory: Decreased recall of precautions   Safety/Judgement: Decreased awareness of safety;Decreased awareness of deficits         General Comments       Exercises        Shoulder Instructions      Home Living Family/patient expects to be discharged to:: Unsure Living Arrangements: Spouse/significant other Available Help at Discharge: Family;Available PRN/intermittently Type of Home: Apartment Home Access: Stairs to enter Entrance Stairs-Number of Steps: 14   Home Layout: One level Alternate Level Stairs-Number of Steps: flight Alternate Level Stairs-Rails: Right Bathroom Shower/Tub: Teacher, early years/pre: Standard Bathroom Accessibility: Yes How Accessible: Accessible via walker Home Equipment: [place] - 2 wheels;Shower seat;Grab bars - tub/shower;Wheelchair - manual;Bedside commode          Prior Functioning/Environment Level of Independence: Independent        Comments: at least 7 falls in the last year    OT Diagnosis: Acute pain;Generalized weakness   OT Problem List: Decreased activity tolerance;Impaired balance (sitting and/or standing);Decreased safety awareness;Decreased knowledge of use of DME or AE;Decreased knowledge of precautions;Pain   OT Treatment/Interventions: Self-care/ADL training;DME and/or AE instruction;Patient/family education    OT Goals(Current goals can be found in the care plan section) Acute Rehab OT Goals Patient Stated Goal: to go to rehab then home OT Goal Formulation: With patient Time For Goal Achievement: 10/23/15 Potential to Achieve Goals: Good ADL Goals Pt Will Perform Lower Body Bathing: with supervision;sitting/lateral leans (with or without AE) Pt Will Perform Lower Body Dressing: with supervision;sitting/lateral leans (with or without AE) Pt Will Transfer to Toilet: with supervision;bedside commode;with transfer board Pt Will Perform Toileting - Clothing Manipulation and hygiene: with supervision;sitting/lateral leans  OT Frequency: Min 2X/week   Barriers to D/C: Inaccessible home environment;Decreased caregiver support          Co-evaluation              End of  Session Equipment Utilized During Treatment: Other (comment) (CAM boot, sliding board)  Activity Tolerance: Patient limited by pain;Patient limited by fatigue Patient left: in bed;with call bell/phone within reach;with family/visitor present   Time: [id number] OT Time Calculation (min): 14 min Charges:  OT General Charges $OT Visit: 1 Procedure OT Evaluation $Initial OT Evaluation Tier I: 1 Procedure G-Codes:     Binnie Kand M.S., OTR/L Pager: [phone]  10/09/2015, 2:40 PM

## 2015-10-09 NOTE — Progress Notes (Signed)
PT Cancellation Note  [name] MRN: [id number] DOB: 09-27-1971   Cancelled Treatment:    Reason Eval/Treat Not Completed: Patient declined, no reason specified [name] is stating she just feels sick and sore despite premedication and denied any mobility attempts today. She states she has been getting to [facility] with friend and instructed not to do so because she will have too much weight on LLE. )   [name] 10/09/2015, 8:55 AM [place], [place]

## 2015-10-10 LAB — LYME, WESTERN BLOT, SERUM (REFLEXED)
IGG P28 AB.: ABSENT
IGG P39 AB.: ABSENT
IGG P41 AB.: ABSENT
IGG P45 AB.: ABSENT
IGG P66 AB.: ABSENT
IGG P93 AB.: ABSENT
IgG P18 Ab.: ABSENT
IgG P23 Ab.: ABSENT
IgG P30 Ab.: ABSENT
IgG P58 Ab.: ABSENT
IgM P23 Ab.: ABSENT
IgM P39 Ab.: ABSENT
IgM P41 Ab.: ABSENT
LYME IGG WB: NEGATIVE
LYME IGM WB: NEGATIVE

## 2015-10-10 LAB — BASIC METABOLIC PANEL
Anion gap: 10 (ref 5–15)
BUN: 8 mg/dL (ref 6–20)
CHLORIDE: 108 mmol/L (ref 101–111)
CO2: 24 mmol/L (ref 22–32)
Calcium: 9.2 mg/dL (ref 8.9–10.3)
Creatinine, Ser: 0.91 mg/dL (ref 0.44–1.00)
GFR calc non Af Amer: >60 mL/min (ref >60)
Glucose, Bld: 129 mg/dL — ABNORMAL HIGH (ref 65–99)
POTASSIUM: 3.9 mmol/L (ref 3.5–5.1)
SODIUM: 142 mmol/L (ref 135–145)

## 2015-10-10 LAB — B. BURGDORFI ANTIBODIES: B BURGDORFERI AB IGG+ IGM: 1.07 {ISR} — AB (ref 0.00–0.90)

## 2015-10-10 MED ORDER — HYDROCODONE-ACETAMINOPHEN 7.5-325 MG PO TABS: 1.0000 | ORAL_TABLET | ORAL | Status: DC | PRN

## 2015-10-10 MED ORDER — AMITRIPTYLINE HCL 50 MG PO TABS: 50.0000 mg | ORAL_TABLET | Freq: Every day | ORAL | Status: DC

## 2015-10-10 MED ORDER — LORAZEPAM 0.5 MG PO TABS: 0.5000 mg | ORAL_TABLET | ORAL | Status: DC | PRN

## 2015-10-10 MED ORDER — LEVOTHYROXINE SODIUM 50 MCG PO TABS: 50.0000 ug | ORAL_TABLET | Freq: Every day | ORAL | Status: DC

## 2015-10-10 MED ORDER — ASPIRIN 325 MG PO TBEC: 325.0000 mg | DELAYED_RELEASE_TABLET | Freq: Every day | ORAL | Status: DC

## 2015-10-10 NOTE — Progress Notes (Signed)
Subjective: 3 Days Post-Op Procedure(s) (LRB): OPEN REDUCTION INTERNAL FIXATION (ORIF)  BIMALLEOLAR ANKLE FRACTURE (Right) Patient reports pain as 4 on 0-10 scale.    Objective: Vital signs in last 24 hours: Temp:  [98.5 F (36.9 C)-98.7 F (37.1 C)] 98.5 F (36.9 C) (12/21 0535) Pulse Rate:  [88-93] 91 (12/21 0535) Resp:  [18-20] 20 (12/21 0535) BP: (131-149)/(92-100) 148/100 mmHg (12/21 0535) SpO2:  [96 %-97 %] 96 % (12/21 0535)  Intake/Output from previous day: 12/20 0701 - 12/21 0700 In: 480 [P.O.:480] Out: -  Intake/Output this shift:     Recent Labs  10/09/15 0712  HGB 10.7*    Recent Labs  10/09/15 0712  WBC 4.6  RBC 3.17*  HCT 32.7*  PLT 224    Recent Labs  10/08/15 0925 10/09/15 0712  NA 139 141  K 3.3* 3.2*  CL 108 109  CO2 21* 21*  BUN 8 7  CREATININE 0.87 0.77  GLUCOSE 109* 91  CALCIUM 9.3 9.3   No results for input(s): LABPT, INR in the last 72 hours.  Neurologically intact  Assessment/Plan: 3 Days Post-Op Procedure(s) (LRB): OPEN REDUCTION INTERNAL FIXATION (ORIF)  BIMALLEOLAR ANKLE FRACTURE (Right) Discharge to SNF  Will need office followup in one to 2 wks.   Trust Crago C 10/10/2015, 8:06 AM

## 2015-10-10 NOTE — Progress Notes (Signed)
Physical Therapy Treatment [name] MRN: [id number] DOB: 03-28-1971 Today's Date: 10/10/2015    History of Present Illness Pt admitted with right ankle fx after fall 12/17 with ORIf Pt with additional fall with recent left ankle fx after fall 09/23/15. PMH is significant for HTN, diarrhea-predominant IBS, NASH, GERD, colitis, PTSD, gallbladder disease and history of C diff    PT Comments    Pt continues to perform well with bed mobility, sliding board transfer, and exercises. Noted pt to have RW in room-questioned pt about this. Pt stated she has been using walker to get to [facility]. Educated pt again on WB precautions and importance of adhering to them. Explained to pt that she cannot safely use a walker without putting too much weight through LEs. Pt just stated "uh huh." Continue to recommend ST rehab at SNF for continued physical therapy and education to allow for proper healing of bil ankle fractures.  Follow Up Recommendations  SNF     Equipment Recommendations   (drop arm 3n1; sliding board)    Recommendations for Other Services       Precautions / Restrictions Precautions Precautions: Fall Required Braces or Orthoses: Other Brace/Splint Other Brace/Splint: CAM boot L LE-did not remove Restrictions Weight Bearing Restrictions: Yes RLE Weight Bearing: Non weight bearing LLE Weight Bearing: Partial weight bearing LLE Partial Weight Bearing Percentage or Pounds: unspecified    Mobility  Bed Mobility Overal bed mobility: Modified Independent                Transfers Overall transfer level: Needs assistance   Transfers: Lateral/Scoot Transfers          Lateral/Scoot Transfers: Min guard General transfer comment: close guard for transfer. assist with set up of SB and positioning of chair. scoot x2, bed<>chair. VCs safety, adherence to WB status  Ambulation/Gait                 Stairs            Wheelchair Mobility     Modified Rankin (Stroke Patients Only)       Balance Overall balance assessment: Needs assistance Sitting-balance support: Bilateral upper extremity supported Sitting balance-Leahy Scale: Good Sitting balance - Comments: Good trunk control during use of SB                            Cognition Arousal/Alertness: Awake/alert Behavior During Therapy: Impulsive Overall Cognitive Status: Impaired/Different from baseline Area of Impairment: Memory;Safety/judgement;Problem solving     Memory: Decreased recall of precautions   Safety/Judgement: Decreased awareness of deficits;Decreased awareness of safety   Problem Solving: Requires verbal cues;Slow processing      Exercises General Exercises - Lower Extremity Quad Sets: AROM;Both;10 reps;Supine Hip ABduction/ADduction: AROM;Left;10 reps;Supine Straight Leg Raises: AROM;Left;10 reps;Supine    General Comments        Pertinent Vitals/Pain Pain Assessment: Faces Pain Score: 7  Pain Location: bil foot/ankle (R worse than L) Pain Descriptors / Indicators: Aching (with dependent position) Pain Intervention(s): Limited activity within patient's tolerance;Repositioned (elevated bil LEs)    Home Living                      Prior Function            PT Goals (current goals can now be found in the care plan section) Progress towards PT goals: Progressing toward goals    Frequency  Min 3X/week  PT Plan Current plan remains appropriate;Frequency needs to be updated    Co-evaluation             End of Session   Activity Tolerance: Patient tolerated treatment well Patient left: in bed;with call bell/phone within reach;with family/visitor present     Time: [id number] PT Time Calculation (min) (ACUTE ONLY): 8 min  Charges:  $Therapeutic Activity: 8-22 mins                    G Codes:      Weston Anna, MPT Pager: [phone]

## 2015-10-10 NOTE — Discharge Summary (Signed)
Report called to [facility] at [employer] [facility].

## 2015-10-10 NOTE — Discharge Instructions (Signed)
You were hospitalized for your right ankle fracture requiring surgery. We will be discharging you with Norco for pain control. Please follow up with ortho surgery regarding management of your ankle after discharge.

## 2015-10-10 NOTE — Clinical Social Work Placement (Signed)
   CLINICAL SOCIAL WORK PLACEMENT  NOTE  Date:  10/10/2015  Patient Details  Name: Jamie Allen MRN: [id number] Date of Birth: 1971/01/15  Clinical Social Work is seeking post-discharge placement for this patient at the [place] level of care (*CSW will initial, date and re-position this form in  chart as items are completed):  Yes   Patient/family provided with [place] Work Department's list of facilities offering this level of care within the geographic area requested by the patient (or if unable, by the patient's family).  Yes   Patient/family informed of their freedom to choose among providers that offer the needed level of care, that participate in Medicare, Medicaid or managed care program needed by the patient, have an available bed and are willing to accept the patient.  Yes   Patient/family informed of [facility]'s ownership interest in [facility] and [facility], as well as of the fact that they are under no obligation to receive care at these facilities.  PASRR submitted to EDS on       PASRR number received on       Existing PASRR number confirmed on 10/08/15     FL2 transmitted to all facilities in geographic area requested by pt/family on 10/08/15     FL2 transmitted to all facilities within larger geographic area on 10/08/15     Patient informed that his/her managed care company has contracts with or will negotiate with certain facilities, including the following:        Yes   Patient/family informed of bed offers received.  Patient chooses bed at [facility]     Physician recommends and patient chooses bed at      Patient to be transferred to [facility] on 10/10/15.  Patient to be transferred to facility by [facility]     Patient family notified on 10/10/15 of transfer.  Name of family member notified:  [name]     PHYSICIAN Please sign FL2     Additional Comment:     _______________________________________________ Caroline Sauger, LCSW 10/10/2015, 11:04 AM

## 2015-10-10 NOTE — Progress Notes (Signed)
Family Medicine Teaching Service Daily Progress Note Intern Pager: [phone]  Patient name: Jamie Allen Medical record number: [id number] Date of birth: [birth year] Age: 44 y.o. Gender: female  Primary Care Provider: Minerva Ends, MD Consultants: Orthopedics Code Status: Full  Pt Overview and Major Events to Date:  12/17: admit for broken right ankle 12/18: ORIF right ankle fracture  Assessment and Plan:  Jamie Allen is a 44 y.o. female presenting with Right Trimalleolar fractures. PMH is significant for recent left ankle fracture; Neuropathy; IBS; Hx of alcohol and cocaine abuse  Right trimalleolar ankle fracture - POD #1, right ankle ORIF - Pain control:   Elevate right leg  Norco 7.5 x 2 q4hrs prn - Zofran when necessary - Consult PT & OT: recommending SNF  - Consider additional workup for frequent fractures  Neuropathy. See outpatient neurology note for work up. Mildly abnormal EMG. No evidence of Guillain-Barre or right cervical/lumbosacral radiculopathies. PTH WNL.  - Gabapentin 900 mg 3 times a day - Holding Lyrica. Given samples by neurologist, but reports will not be able to afford this when samples run out in one week - Elavil 50 mg qhs -B Burgdorfi antibodies pending   Hypokalemia: K low to 3.2 on 12/20.  -Kdur 40 mEq ordered  -BMET 12/21 pending   Subclinical Hypothyroidism: TSH 10.39, T4 wnl. Given significant neuropathy which can be caused by elevated TSH, this warrants treatment - Synthroid 50 mcg - Recommend rechecking TSH in 6 weeks  GI: IBS and nutritional deficiencies. Reports Hx of NASH, but also has hx of ETOH abuse - Continue home: Bentyl 63m QID; folate 1 mg daily, thiamine 50 mg daily, vitamin D 50,000 units weekly - LFTs WNL   Hx of Alcohol and Drug Use - UDS: +  For opiates and THC - Ethanol: <5  FEN/GI: Reg diet; SLIV Prophylaxis: Subq Heparin, holding post op.   Disposition: Pending Placement ?SNF  Subjective:   No complaints this morning. Able to move right leg around much better today.   Objective: Temp:  [98.5 F (36.9 C)-98.7 F (37.1 C)] 98.5 F (36.9 C) (12/21 0535) Pulse Rate:  [88-93] 91 (12/21 0535) Resp:  [18-20] 20 (12/21 0535) BP: (131-149)/(92-100) 148/100 mmHg (12/21 0535) SpO2:  [96 %-97 %] 96 % (12/21 0535) Physical Exam: General: NAD, lying in bed Cardiovascular: RRR Respiratory: CTAB Abdomen: soft, non tender, + BS Extremities: bilateral ankle dressing c/d/i MSK: Able to wiggle toes bilaterally. Grip strength 5/5 bilaterally.   Laboratory:  Recent Labs Lab 10/06/15 1342 10/06/15 1351 10/07/15 0554 10/09/15 0712  WBC 6.4  --  4.7 4.6  HGB 11.9* 12.9 11.2* 10.7*  HCT 36.1 38.0 33.7* 32.7*  PLT 283  --  197 224    Recent Labs Lab 10/06/15 1637 10/07/15 0554 10/08/15 0925 10/09/15 0712  NA  --  139 139 141  K  --  3.6 3.3* 3.2*  CL  --  108 108 109  CO2  --  24 21* 21*  BUN  --  8 8 7   CREATININE  --  0.90 0.87 0.77  CALCIUM  --  9.1 9.3 9.3  PROT 6.3* 5.7*  --   --   BILITOT 0.2* 0.6  --   --   ALKPHOS 128* 129*  --   --   ALT 18 16  --   --   AST 29 27  --   --   GLUCOSE  --  89 109* 91   No results found.  Nicolette Bang, DO 10/10/2015, 6:54 AM PGY-1, [place] Intern pager: [phone], text pages welcome

## 2015-10-10 NOTE — Discharge Planning (Signed)
Patient to be discharged to [facility] (previously [facility]). Patient and patient's SO, [name], updated regarding discharge.  Facility: [facility] RN report number: [phone] Transportation: Greig Castilla, [place] Orthopedics: [phone] Surgical: [phone]

## 2015-10-11 ENCOUNTER — Non-Acute Institutional Stay (SKILLED_NURSING_FACILITY): Payer: Self-pay | Admitting: Adult Health

## 2015-10-11 ENCOUNTER — Encounter: Payer: Self-pay | Admitting: Adult Health

## 2015-10-11 DIAGNOSIS — G5792 Unspecified mononeuropathy of left lower limb: Secondary | ICD-10-CM

## 2015-10-11 DIAGNOSIS — S82841D Displaced bimalleolar fracture of right lower leg, subsequent encounter for closed fracture with routine healing: Secondary | ICD-10-CM

## 2015-10-11 DIAGNOSIS — F329 Major depressive disorder, single episode, unspecified: Secondary | ICD-10-CM

## 2015-10-11 DIAGNOSIS — E876 Hypokalemia: Secondary | ICD-10-CM

## 2015-10-11 DIAGNOSIS — K7581 Nonalcoholic steatohepatitis (NASH): Secondary | ICD-10-CM

## 2015-10-11 DIAGNOSIS — G5793 Unspecified mononeuropathy of bilateral lower limbs: Secondary | ICD-10-CM

## 2015-10-11 DIAGNOSIS — S82892A Other fracture of left lower leg, initial encounter for closed fracture: Secondary | ICD-10-CM

## 2015-10-11 DIAGNOSIS — G5791 Unspecified mononeuropathy of right lower limb: Secondary | ICD-10-CM

## 2015-10-11 DIAGNOSIS — F419 Anxiety disorder, unspecified: Secondary | ICD-10-CM

## 2015-10-11 DIAGNOSIS — F418 Other specified anxiety disorders: Secondary | ICD-10-CM
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
## 2015-10-16 ENCOUNTER — Ambulatory Visit (INDEPENDENT_AMBULATORY_CARE_PROVIDER_SITE_OTHER): Payer: Self-pay | Admitting: Neurology

## 2015-10-16 ENCOUNTER — Encounter: Payer: Self-pay | Admitting: Neurology

## 2015-10-16 VITALS — BP 109/83 | HR 112 | Ht 61.0 in

## 2015-10-16 DIAGNOSIS — G629 Polyneuropathy, unspecified: Secondary | ICD-10-CM

## 2015-10-16 DIAGNOSIS — K7581 Nonalcoholic steatohepatitis (NASH): Secondary | ICD-10-CM

## 2015-10-16 DIAGNOSIS — R269 Unspecified abnormalities of gait and mobility: Secondary | ICD-10-CM

## 2015-10-16 DIAGNOSIS — S92901D Unspecified fracture of right foot, subsequent encounter for fracture with routine healing: Secondary | ICD-10-CM
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
## 2015-10-18 ENCOUNTER — Encounter: Payer: Self-pay | Admitting: Internal Medicine

## 2015-10-18 ENCOUNTER — Non-Acute Institutional Stay (SKILLED_NURSING_FACILITY): Payer: Self-pay | Admitting: Internal Medicine

## 2015-10-18 DIAGNOSIS — Z9889 Other specified postprocedural states: Principal | ICD-10-CM

## 2015-10-18 DIAGNOSIS — F329 Major depressive disorder, single episode, unspecified: Secondary | ICD-10-CM

## 2015-10-18 DIAGNOSIS — G5792 Unspecified mononeuropathy of left lower limb: Secondary | ICD-10-CM

## 2015-10-18 DIAGNOSIS — E038 Other specified hypothyroidism: Secondary | ICD-10-CM

## 2015-10-18 DIAGNOSIS — F419 Anxiety disorder, unspecified: Secondary | ICD-10-CM

## 2015-10-18 DIAGNOSIS — F32A Depression, unspecified: Secondary | ICD-10-CM

## 2015-10-18 DIAGNOSIS — F418 Other specified anxiety disorders: Secondary | ICD-10-CM

## 2015-10-18 DIAGNOSIS — E876 Hypokalemia: Secondary | ICD-10-CM

## 2015-10-18 DIAGNOSIS — G5791 Unspecified mononeuropathy of right lower limb: Secondary | ICD-10-CM

## 2015-10-18 DIAGNOSIS — G5793 Unspecified mononeuropathy of bilateral lower limbs: Secondary | ICD-10-CM

## 2015-10-18 DIAGNOSIS — Z8781 Personal history of (healed) traumatic fracture: Secondary | ICD-10-CM

## 2015-10-18 DIAGNOSIS — Z967 Presence of other bone and tendon implants: Secondary | ICD-10-CM

## 2015-10-18 DIAGNOSIS — K589 Irritable bowel syndrome without diarrhea: Secondary | ICD-10-CM

## 2015-10-18 DIAGNOSIS — K7460 Unspecified cirrhosis of liver: Secondary | ICD-10-CM

## 2015-10-18 DIAGNOSIS — E034 Atrophy of thyroid (acquired): Secondary | ICD-10-CM

## 2015-10-18 NOTE — Progress Notes (Signed)
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    --------- RNP                      Mixed Connective Tissue                          Disease                         95% (U1 nRNP,                SLE                        30 - 50% anti-ribonucleoprotein)  Polymyositis and/or                          Dermatomyositis                 20% ---------------------   ------------------------   --------- Scl-70 (antiDNA          Scleroderma (diffuse)      20 - 35% topoisomerase)           Crest                           13% ---------------------   ------------------------   --------- Jo-1                     Polymyositis  and/or                          Dermatomyositis            20 - 40% ---------------------   ------------------------   --------- Centromere B             Scleroderma -                           Crest                          variant                         80%   . Copper 10/02/2015 207* 72 - 166 ug/dL Final                                   Detection Limit = 5  . Sed Rate 10/02/2015 47* 0 - 32 mm/hr Final  . Total Protein 10/02/2015 6.7  6.0 - 8.5 g/dL Final  . Albumin ELP 10/02/2015 3.0  2.9 - 4.4 g/dL Final  . Alpha 1 10/02/2015 0.4  0.0 - 0.4 g/dL Final  . Alpha 2 10/02/2015 0.9  0.4 - 1.0 g/dL Final  . Beta 10/02/2015 1.3  0.7 - 1.3 g/dL Final  . Gamma Globulin 10/02/2015 1.1  0.4 - 1.8 g/dL Final  . M-Spike, % 10/02/2015 Not Observed  Not Observed g/dL Final  . GLOBULIN, TOTAL 10/02/2015 3.7  2.2 - 3.9 g/dL Final  . A/G Ratio 10/02/2015 0.8  0.7 - 1.7 Final  . PLEASE NOTE: 10/02/2015 Comment   Final   Comment: Protein electrophoresis scan will follow via computer, mail, or courier delivery.   . Hgb A1c MFr Bld 10/02/2015 5.1  4.8 - 5.6 % Final   Comment:          Pre-diabetes: 5.7 - 6.4          Diabetes: >6.4          Glycemic control for adults with diabetes: <7.0   . Hep A IgM 10/02/2015 Negative  Negative Final  . Hepatitis B Surface Ag 10/02/2015 Negative  Negative Final  . Hep B C IgM 10/02/2015 Negative  Negative Final  . Hep C Virus Ab 10/02/2015 <0.1  0.0 - 0.9 s/co ratio Final   Comment:                                   Negative:     < 0.8                              Indeterminate: 0.8 - 0.9                                   Positive:     > 0.9  The CDC recommends that a positive HCV antibody result  be followed up with a HCV Nucleic Acid Amplification  test (536644).   Admission on 09/23/2015, Discharged on 09/27/2015  Component Date Value Ref Range Status  . WBC 09/23/2015 7.2  4.0 - 10.5 K/uL Final  . RBC 09/23/2015 3.59* 3.87 - 5.11 MIL/uL Final  .  Hemoglobin 09/23/2015 13.0  12.0 - 15.0 g/dL Final  . HCT 09/23/2015 39.0  36.0 - 46.0 % Final  . MCV 09/23/2015 108.6* 78.0 - 100.0 fL Final  . MCH 09/23/2015 36.2* 26.0 - 34.0 pg Final  . MCHC 09/23/2015 33.3  30.0 - 36.0 g/dL Final  . RDW 09/23/2015 16.5* 11.5 - 15.5 % Final  . Platelets 09/23/2015 435* 150 - 400 K/uL Final  . Neutrophils Relative % 09/23/2015 81   Final  . Neutro Abs 09/23/2015 5.8  1.7 - 7.7 K/uL Final  . Lymphocytes Relative 09/23/2015 13   Final  . Lymphs Abs 09/23/2015 0.9  0.7 - 4.0 K/uL Final  . Monocytes Relative 09/23/2015 5   Final  . Monocytes Absolute 09/23/2015 0.4  0.1 - 1.0 K/uL Final  . Eosinophils Relative 09/23/2015 1   Final  . Eosinophils Absolute 09/23/2015 0.1  0.0 - 0.7 K/uL Final  . Basophils Relative 09/23/2015 0   Final  . Basophils Absolute 09/23/2015 0.0  0.0 - 0.1 K/uL Final  . Sodium 09/23/2015 140  135 - 145 mmol/L Final  . Potassium 09/23/2015 4.0  3.5 - 5.1 mmol/L Final  . Chloride 09/23/2015 105  101 - 111 mmol/L Final  . CO2 09/23/2015 25  22 - 32 mmol/L Final  . Glucose, Bld 09/23/2015 117* 65 - 99 mg/dL Final  . BUN 09/23/2015 <5* 6 - 20 mg/dL Final  . Creatinine, Ser 09/23/2015 0.88  0.44 - 1.00 mg/dL Final  . Calcium 09/23/2015 10.5* 8.9 - 10.3 mg/dL Final  .  Total Protein 09/23/2015 8.5* 6.5 - 8.1 g/dL Final  . Albumin 09/23/2015 3.8  3.5 - 5.0 g/dL Final  . AST 09/23/2015 44* 15 - 41 U/L Final  . ALT 09/23/2015 40  14 - 54 U/L Final  . Alkaline Phosphatase 09/23/2015 154* 38 - 126 U/L Final  . Total Bilirubin 09/23/2015 0.7  0.3 - 1.2 mg/dL Final  . GFR calc non Af Amer 09/23/2015 >60  >60 mL/min Final  . GFR calc Af Amer 09/23/2015 >60  >60 mL/min Final   Comment: (NOTE) The eGFR has been calculated using the CKD EPI equation. This calculation has not been validated in all clinical situations. eGFR's persistently <60 mL/min signify possible Chronic Kidney Disease.   . Anion gap 09/23/2015 10  5 - 15 Final  . Color,  Urine 09/23/2015 YELLOW  YELLOW Final  . APPearance 09/23/2015 CLEAR  CLEAR Final  . Specific Gravity, Urine 09/23/2015 1.006  1.005 - 1.030 Final  . pH 09/23/2015 6.0  5.0 - 8.0 Final  . Glucose, UA 09/23/2015 NEGATIVE  NEGATIVE mg/dL Final  . Hgb urine dipstick 09/23/2015 NEGATIVE  NEGATIVE Final  . Bilirubin Urine 09/23/2015 NEGATIVE  NEGATIVE Final  . Ketones, ur 09/23/2015 NEGATIVE  NEGATIVE mg/dL Final  . Protein, ur 09/23/2015 NEGATIVE  NEGATIVE mg/dL Final  . Nitrite 09/23/2015 NEGATIVE  NEGATIVE Final  . Leukocytes, UA 09/23/2015 NEGATIVE  NEGATIVE Final   MICROSCOPIC NOT DONE ON URINES WITH NEGATIVE PROTEIN, BLOOD, LEUKOCYTES, NITRITE, OR GLUCOSE <1000 mg/dL.  Marland Kitchen Alcohol, Ethyl (B) 09/23/2015 <5  <5 mg/dL Final   Comment:        LOWEST DETECTABLE LIMIT FOR SERUM ALCOHOL IS 5 mg/dL FOR MEDICAL PURPOSES ONLY   . Opiates 09/23/2015 NONE DETECTED  NONE DETECTED Final  . Cocaine 09/23/2015 NONE DETECTED  NONE DETECTED Final  . Benzodiazepines 09/23/2015 POSITIVE* NONE DETECTED Final  . Amphetamines 09/23/2015 NONE DETECTED  NONE DETECTED Final  . Tetrahydrocannabinol 09/23/2015 POSITIVE* NONE DETECTED Final  . Barbiturates 09/23/2015 NONE DETECTED  NONE DETECTED Final   Comment:        DRUG SCREEN FOR MEDICAL PURPOSES ONLY.  IF CONFIRMATION IS NEEDED FOR ANY PURPOSE, NOTIFY LAB WITHIN 5 DAYS.        LOWEST DETECTABLE LIMITS FOR URINE DRUG SCREEN Drug Class       Cutoff (ng/mL) Amphetamine      1000 Barbiturate      200 Benzodiazepine   124 Tricyclics       580 Opiates          300 Cocaine          300 THC              50   . Troponin i, poc 09/23/2015 0.00  0.00 - 0.08 ng/mL Final  . Comment 3 09/23/2015          Final   Comment: Due to the release kinetics of cTnI, a negative result within the first hours of the onset of symptoms does not rule out myocardial infarction with certainty. If myocardial infarction is still suspected, repeat the test at appropriate  intervals.   . Sodium 09/24/2015 141  135 - 145 mmol/L Final  . Potassium 09/24/2015 3.5  3.5 - 5.1 mmol/L Final  . Chloride 09/24/2015 109  101 - 111 mmol/L Final  . CO2 09/24/2015 23  22 - 32 mmol/L Final  . Glucose, Bld 09/24/2015 99  65 - 99 mg/dL Final  . BUN 09/24/2015 6  6 -  20 mg/dL Final  . Creatinine, Ser 09/24/2015 0.88  0.44 - 1.00 mg/dL Final  . Calcium 09/24/2015 9.0  8.9 - 10.3 mg/dL Final  . GFR calc non Af Amer 09/24/2015 >60  >60 mL/min Final  . GFR calc Af Amer 09/24/2015 >60  >60 mL/min Final   Comment: (NOTE) The eGFR has been calculated using the CKD EPI equation. This calculation has not been validated in all clinical situations. eGFR's persistently <60 mL/min signify possible Chronic Kidney Disease.   . Anion gap 09/24/2015 9  5 - 15 Final  . RPR Ser Ql 09/24/2015 Non Reactive  Non Reactive Final   Comment: (NOTE) Performed At: [facility] [place], [place] [id number] Lindon Romp MD LP:[id number]   . Preg Test, Ur 09/24/2015 NEGATIVE  NEGATIVE Final   Comment:        THE SENSITIVITY OF THIS METHODOLOGY IS >20 mIU/mL.   Marland Kitchen Sodium 09/25/2015 141  135 - 145 mmol/L Final  . Potassium 09/25/2015 3.7  3.5 - 5.1 mmol/L Final  . Chloride 09/25/2015 106  101 - 111 mmol/L Final  . CO2 09/25/2015 24  22 - 32 mmol/L Final  . Glucose, Bld 09/25/2015 154* 65 - 99 mg/dL Final  . BUN 09/25/2015 <5* 6 - 20 mg/dL Final  . Creatinine, Ser 09/25/2015 0.83  0.44 - 1.00 mg/dL Final  . Calcium 09/25/2015 10.0  8.9 - 10.3 mg/dL Final  . GFR calc non Af Amer 09/25/2015 >60  >60 mL/min Final  . GFR calc Af Amer 09/25/2015 >60  >60 mL/min Final   Comment: (NOTE) The eGFR has been calculated using the CKD EPI equation. This calculation has not been validated in all clinical situations. eGFR's persistently <60 mL/min signify possible Chronic Kidney Disease.   . Anion gap 09/25/2015 11  5 - 15 Final   Dg Tibia/fibula Left  09/23/2015  CLINICAL  DATA:  Fall, pain. EXAM: LEFT TIBIA AND FIBULA - 2 VIEW COMPARISON:  None. FINDINGS: There is no evidence of fracture or other focal bone lesions. Soft tissues are unremarkable. Tibial plafond fracture, with intra-articular extension, at the lateral mortise is only seen on the separate plain films of the left ankle. IMPRESSION: Negative. Tibial plafond fracture, with intra-articular extension, at the lateral mortise is only seen on the separate plain films of the left ankle. Electronically Signed   By: Franki Cabot M.D.   On: 09/23/2015 08:28   Dg Ankle Complete Left  09/23/2015  CLINICAL DATA:  [place] tonight. EXAM: LEFT ANKLE COMPLETE - 3+ VIEW COMPARISON:  None. FINDINGS: There is a mildly displaced fracture of the tibial plafond, intra-articular at the lateral mortise. No other acute fracture is evident. IMPRESSION: Tibial plafond fracture. Electronically Signed   By: Andreas Newport M.D.   On: 09/23/2015 06:41   Dg Ankle Complete Right  10/07/2015  CLINICAL DATA:  ORIF RIGHT ankle fracture EXAM: DG C-ARM 61-120 MIN; RIGHT ANKLE - COMPLETE 3+ VIEW COMPARISON:  10/06/2015 FLUOROSCOPY TIME:  0 minutes 11.5 seconds Images obtained: 3 FINDINGS: Two cannulated screws have been placed across to reduced medial malleolar fracture. Lateral plate and 7 screws have been placed across a reduced lateral malleolar fracture. Nondisplaced posterior malleolar fracture fragment again identified. Bones demineralized. Minimal asymmetric widening of the medial ankle mortise versus lateral. No additional fracture seen. IMPRESSION: Post ORIF of medial and lateral malleolar fractures. Nondisplaced posterior malleolar fracture. Electronically Signed   By: Lavonia Dana M.D.   On: 10/07/2015 10:35  Dg Ankle Complete Right  10/06/2015  CLINICAL DATA:  Fall with pain and swelling of right ankle. EXAM: RIGHT ANKLE - COMPLETE 3+ VIEW COMPARISON:  None. FINDINGS: Acute bimalleolar fracture involving lateral and medial malleoli  present. Medial malleolus fracture demonstrates approximately 2-3 mm of displacement. The oblique distal fibular fracture is relatively nondisplaced. No posterior malleolar component identified. Diffuse soft tissue swelling is seen. The ankle mortise shows relatively normal alignment with no visible fracture of the talar dome. IMPRESSION: Acute bimalleolar right ankle fracture with mildly displaced medial malleolar fracture and relatively nondisplaced oblique distal fibular fracture. Electronically Signed   By: Aletta Edouard M.D.   On: 10/06/2015 12:19   Ct Angio Chest Pe W/cm &/or Wo Cm  09/23/2015  CLINICAL DATA:  Left tibial fracture with tachycardia and shortness of breath. EXAM: CT ANGIOGRAPHY CHEST WITH CONTRAST TECHNIQUE: Multidetector CT imaging of the chest was performed using the standard protocol during bolus administration of intravenous contrast. Multiplanar CT image reconstructions and MIPs were obtained to evaluate the vascular anatomy. CONTRAST:  156m OMNIPAQUE IOHEXOL 350 MG/ML SOLN COMPARISON:  Chest x-ray on 06/24/2015 FINDINGS: The pulmonary arteries are well opacified. There is no evidence of pulmonary embolism. The thoracic aorta is also well opacified and shows normal caliber and patency. Proximal great vessels are normally patent. Lungs show bibasilar atelectasis. No edema or airspace consolidation identified. No evidence of pneumothorax or pleural fluid. The heart size is normal. No pericardial fluid identified. No evidence of pulmonary nodules or enlarged lymph nodes. Visualized upper abdomen is unremarkable. No fractures are identified. Review of the MIP images confirms the above findings. IMPRESSION: No evidence of pulmonary embolism or other acute findings. Bibasilar atelectasis present. Electronically Signed   By: GAletta EdouardM.D.   On: 09/23/2015 10:01   Mr BJeri CosWKGContrast  09/26/2015  CLINICAL DATA:  Initial evaluation for peripheral neuropathy of unclear etiology.  EXAM: MRI HEAD WITHOUT AND WITH CONTRAST MRI CERVICAL SPINE WITHOUT AND WITH CONTRAST TECHNIQUE: Multiplanar, multiecho pulse sequences of the brain and surrounding structures, and cervical spine, to include the craniocervical junction and cervicothoracic junction, were obtained without and with intravenous contrast. CONTRAST:  10 cc of MultiHance. COMPARISON:  None. FINDINGS: MRI HEAD FINDINGS Cerebral volume within normal limits for patient age. There is minimal T2/FLAIR hyperintensity within the periventricular deep white matter both cerebral hemispheres, nonspecific, but most likely related to very mild chronic small vessel ischemic disease. No abnormal foci of restricted diffusion to suggest acute intracranial infarct. Gray-white matter differentiation maintained. Normal intravascular flow voids are preserved. No acute or chronic intracranial hemorrhage. No areas of chronic infarction. No mass lesion, midline shift, or mass effect. No hydrocephalus. No extra-axial fluid collection. No abnormal enhancement. Deep gray nuclei are normal. Craniocervical junction within normal limits. Pituitary gland normal in appearance.  Pituitary stalk is midline. No acute abnormality about the orbits. Paranasal sinuses are clear. No mastoid effusion. Inner ear structures grossly normal. Bone marrow signal intensity within normal limits. No scalp soft tissue abnormality. MRI CERVICAL SPINE FINDINGS Study is degraded by motion artifact. Vertebral bodies are normally aligned with preservation of the normal cervical lordosis. Vertebral body heights are well maintained. Signal intensity within the vertebral body bone marrow within normal limits. No fracture or listhesis. No focal osseous lesion. No marrow edema. Signal intensity within the cervical spinal cord is normal. No abnormal enhancement. Visualized soft tissues of the neck demonstrate no acute abnormality. No prevertebral edema. C2-C3: Negative. C3-C4:  Negative. C4-C5:  Negative. C5-C6: Diffuse annular disc bulge with intervertebral disc space narrowing and endplate osteophytic spurring. There is a posterior disc protrusion which indents and partially flattens the ventral thecal sac with mild flattening of the cervical spinal cord. No associated cord signal changes. Resultant mild canal and bilateral foraminal narrowing. C6-C7: Mild annular disc bulge without focal disc protrusion. Minimal uncovertebral spurring. No significant stenosis. C7-T1:  Negative. Visualized upper thoracic spine demonstrates no acute abnormality. IMPRESSION: MRI BRAIN IMPRESSION: 1. No acute intracranial process. 2. Minimal patchy T2/FLAIR hyperintensity within the periventricular and deep white matter both cerebral hemispheres. Finding is nonspecific, but most likely related to very mild chronic small vessel ischemic changes. This would not be characteristic of underlying demyelinating disease. MRI CERVICAL SPINE IMPRESSION: 1. Normal MRI appearance of the cervical spinal cord without evidence for cord compression or severe canal stenosis. 2. Posterior disc protrusion at C5-6 with associated mild canal narrowing, mild cord flattening, and mild bilateral foraminal stenosis. No associated cord signal changes. 3. Mild disc bulge at C6-7 without significant stenosis. Electronically Signed   By: Jeannine Boga M.D.   On: 09/26/2015 01:10   Mr Thoracic Spine Wo Contrast  10/05/2015  [facility] [address], [place],  [address] [phone] NEUROIMAGING REPORT STUDY DATE: 10/04/2015 PATIENT NAME: Avilene Marrin DOB: [birth year] MRN: [id number] EXAM: MRI of the thoracic spine ORDERING CLINICIAN: Marcial Pacas M.D. PhD CLINICAL HISTORY: 44 year old woman with gait disturbance and limb weakness and numbness COMPARISON FILMS: None TECHNIQUE: MRI of the thoracic spine was obtained utilizing 3 mm sagittal slices from the posterior fossa from C7 to L1 level with T1, T2 and  inversion recovery views. In addition 4 mm axial slices from Y1P5 to K93O6 level were included with T2 and gradient echo views. CONTRAST: None IMAGING SITE: [facility],  [address] FINDINGS: :  On scout images, the spine is imaged from above the cervicomedullary junction to T9.    The spinal cord is of normal caliber and signal.   The vertebral bodies are normally aligned.   The vertebral bodies have normal signal.  The discs and interspaces were further evaluated on axial views from C7 to L1.    No significant degenerative changes are noted.   10/05/2015   This MRI of the thoracic spine shows the following: 1.  Minimal scoliosis of the upper thoracic spine convex to the right. 2.  The spinal cord appears normal. 3.   There are no significant degenerative changes. INTERPRETING PHYSICIAN: Richard A. Felecia Shelling, MD, PhD Certified in  Neuroimaging by [place] [employer]   Mr Lumbar Spine Wo Contrast  10/04/2015  [facility] NEUROIMAG[facility] REPORT STUDY DATE: 10/02/15 PATIENT NAME: Esha Fincher DOB: [birth year] MRN: [id number] ORDERING CLINICIAN: Marcial Pacas, MD PhD CLINICAL HISTORY: 44 year old female with abnormal gait. EXAM: MRI lumbar spine (without) TECHNIQUE: MRI of the lumbar spine was obtained utilizing 4 mm sagittal slices from I33-82 down to the lower sacrum with T1, T2 and inversion recovery views. In addition 4 mm axial slices from N0-5 down to L5-S1 level were included with T1 and T2 weighted views. CONTRAST: no IMAGING SITE: [facility] [address] (3 Tesla MRI)  FINDINGS: On sagittal views the vertebral bodies have normal height and alignment. The conus medullaris terminates at the level of L1.  On axial views, mild facet hypertrophy, with no spinal stenosis or foraminal narrowing. Limited views of the aorta, kidneys, iliopsoas muscles and sacroiliac joints are notable  for stable mild left renal atrophy.   10/04/2015  Unremarkable MRI  lumbar spine (without). No spinal stenosis or foraminal narrowing. No change from MRI on 08/17/15. INTERPRETING PHYSICIAN: Penni Bombard, MD Certified in Neurology, Neurophysiology and Neuroimaging [facility] [address], [place], [place] [address] [phone]   Mr Cervical Spine Moise Boring Contrast  09/26/2015  CLINICAL DATA:  Initial evaluation for peripheral neuropathy of unclear etiology. EXAM: MRI HEAD WITHOUT AND WITH CONTRAST MRI CERVICAL SPINE WITHOUT AND WITH CONTRAST TECHNIQUE: Multiplanar, multiecho pulse sequences of the brain and surrounding structures, and cervical spine, to include the craniocervical junction and cervicothoracic junction, were obtained without and with intravenous contrast. CONTRAST:  10 cc of MultiHance. COMPARISON:  None. FINDINGS: MRI HEAD FINDINGS Cerebral volume within normal limits for patient age. There is minimal T2/FLAIR hyperintensity within the periventricular deep white matter both cerebral hemispheres, nonspecific, but most likely related to very mild chronic small vessel ischemic disease. No abnormal foci of restricted diffusion to suggest acute intracranial infarct. Gray-white matter differentiation maintained. Normal intravascular flow voids are preserved. No acute or chronic intracranial hemorrhage. No areas of chronic infarction. No mass lesion, midline shift, or mass effect. No hydrocephalus. No extra-axial fluid collection. No abnormal enhancement. Deep gray nuclei are normal. Craniocervical junction within normal limits. Pituitary gland normal in appearance.  Pituitary stalk is midline. No acute abnormality about the orbits. Paranasal sinuses are clear. No mastoid effusion. Inner ear structures grossly normal. Bone marrow signal intensity within normal limits. No scalp soft tissue abnormality. MRI CERVICAL SPINE FINDINGS Study is degraded by motion artifact. Vertebral bodies are normally aligned with preservation of the normal  cervical lordosis. Vertebral body heights are well maintained. Signal intensity within the vertebral body bone marrow within normal limits. No fracture or listhesis. No focal osseous lesion. No marrow edema. Signal intensity within the cervical spinal cord is normal. No abnormal enhancement. Visualized soft tissues of the neck demonstrate no acute abnormality. No prevertebral edema. C2-C3: Negative. C3-C4:  Negative. C4-C5:  Negative. C5-C6: Diffuse annular disc bulge with intervertebral disc space narrowing and endplate osteophytic spurring. There is a posterior disc protrusion which indents and partially flattens the ventral thecal sac with mild flattening of the cervical spinal cord. No associated cord signal changes. Resultant mild canal and bilateral foraminal narrowing. C6-C7: Mild annular disc bulge without focal disc protrusion. Minimal uncovertebral spurring. No significant stenosis. C7-T1:  Negative. Visualized upper thoracic spine demonstrates no acute abnormality. IMPRESSION: MRI BRAIN IMPRESSION: 1. No acute intracranial process. 2. Minimal patchy T2/FLAIR hyperintensity within the periventricular and deep white matter both cerebral hemispheres. Finding is nonspecific, but most likely related to very mild chronic small vessel ischemic changes. This would not be characteristic of underlying demyelinating disease. MRI CERVICAL SPINE IMPRESSION: 1. Normal MRI appearance of the cervical spinal cord without evidence for cord compression or severe canal stenosis. 2. Posterior disc protrusion at C5-6 with associated mild canal narrowing, mild cord flattening, and mild bilateral foraminal stenosis. No associated cord signal changes. 3. Mild disc bulge at C6-7 without significant stenosis. Electronically Signed   By: Jeannine Boga M.D.   On: 09/26/2015 01:10   Ct Ankle Left Wo Contrast  09/23/2015  CLINICAL DATA:  Status post fall. Evaluate fracture of the tibial plafond. EXAM: CT OF THE LEFT ANKLE  WITHOUT CONTRAST TECHNIQUE: Multidetector CT imaging of the left ankle was performed according to the standard protocol. Multiplanar CT image reconstructions were also generated. COMPARISON:  Ankle radiographs 09/23/2015. FINDINGS:  Exam quality suboptimal, in part secondary to motion. CT confirms the presence of a minimally displaced fracture of the tibial plafond anterolaterally adjacent to the distal fibula. This is likely mediated by the anterior inferior tibiofibular ligament. Defect in the articular surface measures less than 2 mm. No other acute fractures are identified. There is probable mild spurring of the fibular tip. The talar dome appears intact. There is no widening of the ankle mortise or dislocation. The subtalar joint and visualized tarsal bones appear normal. As evaluated by CT, the ankle tendons appear intact without entrapment. There is soft tissue swelling both medially and laterally. IMPRESSION: 1. Intra-articular fracture of the tibial plafond anterolaterally with minimal displacement. 2. No other fractures or acute osseous findings. Electronically Signed   By: Richardean Sale M.D.   On: 09/23/2015 09:57   Dg Knee Complete 4 Views Left  09/23/2015  CLINICAL DATA:  Fall yesterday, pain. EXAM: LEFT KNEE - COMPLETE 4+ VIEW COMPARISON:  None. FINDINGS: There is no evidence of fracture, dislocation, or joint effusion. There is no evidence of arthropathy or other focal bone abnormality. Soft tissues are unremarkable. IMPRESSION: Negative. Electronically Signed   By: Franki Cabot M.D.   On: 09/23/2015 08:25   Dg Foot Complete Right  10/06/2015  CLINICAL DATA:  Tripped over her walker 1 day ago, recent LEFT ankle surgery, pain and swelling RIGHT ankle EXAM: RIGHT FOOT COMPLETE - 3+ VIEW COMPARISON:  None FINDINGS: Joint spaces preserved. Bones appear demineralized. Mildly distracted transverse medial malleolar fracture. Minimally displaced oblique distal RIGHT fibular fracture. Minimally  displaced posterior malleolar fracture fragment. No additional fracture, dislocation, or bone destruction. Soft tissue swelling at ankle. IMPRESSION: Trimalleolar fractures RIGHT ankle as above. Electronically Signed   By: Lavonia Dana M.D.   On: 10/06/2015 12:19   Dg C-arm 1-60 Min  10/07/2015  CLINICAL DATA:  ORIF RIGHT ankle fracture EXAM: DG C-ARM 61-120 MIN; RIGHT ANKLE - COMPLETE 3+ VIEW COMPARISON:  10/06/2015 FLUOROSCOPY TIME:  0 minutes 11.5 seconds Images obtained: 3 FINDINGS: Two cannulated screws have been placed across to reduced medial malleolar fracture. Lateral plate and 7 screws have been placed across a reduced lateral malleolar fracture. Nondisplaced posterior malleolar fracture fragment again identified. Bones demineralized. Minimal asymmetric widening of the medial ankle mortise versus lateral. No additional fracture seen. IMPRESSION: Post ORIF of medial and lateral malleolar fractures. Nondisplaced posterior malleolar fracture. Electronically Signed   By: Lavonia Dana M.D.   On: 10/07/2015 10:35     Assessment/Plan   ICD-9-CM ICD-10-CM   1. Status post ORIF of fracture of ankle V45.89 Z96.7    V15.51 Z87.81    right  2. Anxiety and depression 300.4 F41.8   3. Cirrhosis of liver without ascites, unspecified hepatic cirrhosis type (HCC) 571.5 K74.60   4. Hypothyroidism due to acquired atrophy of thyroid - new dx 244.8 E03.8    246.8 E03.4   5. IBS (irritable bowel syndrome) - diarrhea type 564.1 K58.9   6. Neuropathic pain, leg, bilateral 356.9 G57.91     G57.92   7. Hypokalemia 276.8 E87.6     Cont current meds as ordered  F/u with specialists as scheduled  T/c DXA scan once stable  F/u B burgdorfi Abs  Check BMP  Repeat TSH in 6 weeks  PT/OT as ordered  Pain control  Nutritional supplements as indicated  GOAL: short term rehab and d/c home when medically appropriate. Communicated with pt and nursing.  Will follow  Deette Revak S. Rae Lips.,  [facility] [address] [place], [place] [address] [phone] Cell (Monday-Friday 8 AM - 5 PM) [phone] After 5 PM and follow prompts

## 2015-11-06 ENCOUNTER — Telehealth: Payer: Self-pay | Admitting: Family Medicine

## 2015-11-06 NOTE — Telephone Encounter (Signed)
Pt calling and states that she broke her ankle and her foot doctor informed her to ask PCP to increase the dosage of gabapentin (NEURONTIN) 300 MG capsule to help her with the pain. Please advise patient at the earliest convenience. Thank you, Fonda Kinder, ASA

## 2015-11-08 NOTE — Telephone Encounter (Signed)
Gabapentin is already at high dose I do not recommend increasing. Patient should follow up with ortho following her ankle fracture. Patient to f/u with me once discharged from nursing home.

## 2015-11-09 ENCOUNTER — Non-Acute Institutional Stay (SKILLED_NURSING_FACILITY): Payer: Self-pay | Admitting: Adult Health

## 2015-11-09 DIAGNOSIS — G5793 Unspecified mononeuropathy of bilateral lower limbs: Secondary | ICD-10-CM

## 2015-11-09 DIAGNOSIS — S82841D Displaced bimalleolar fracture of right lower leg, subsequent encounter for closed fracture with routine healing: Secondary | ICD-10-CM

## 2015-11-09 DIAGNOSIS — S82892A Other fracture of left lower leg, initial encounter for closed fracture: Secondary | ICD-10-CM

## 2015-11-09 DIAGNOSIS — G5792 Unspecified mononeuropathy of left lower limb: Secondary | ICD-10-CM

## 2015-11-09 DIAGNOSIS — G5791 Unspecified mononeuropathy of right lower limb: Secondary | ICD-10-CM

## 2015-11-14 NOTE — Telephone Encounter (Signed)
Pt notified Gabapentin is already at high dose  Stated injured her other ankle. Had ortho appointment and F/U appointment with PCP

## 2015-11-26 ENCOUNTER — Ambulatory Visit: Payer: Self-pay | Admitting: Family Medicine

## 2015-12-06 ENCOUNTER — Ambulatory Visit (HOSPITAL_BASED_OUTPATIENT_CLINIC_OR_DEPARTMENT_OTHER): Payer: No Typology Code available for payment source | Admitting: Clinical

## 2015-12-06 ENCOUNTER — Ambulatory Visit: Payer: No Typology Code available for payment source | Attending: Family Medicine | Admitting: Family Medicine

## 2015-12-06 ENCOUNTER — Encounter: Payer: Self-pay | Admitting: Family Medicine

## 2015-12-06 VITALS — BP 138/91 | HR 99 | Temp 98.6°F | Resp 16 | Ht 61.0 in | Wt 128.0 lb

## 2015-12-06 DIAGNOSIS — G5793 Unspecified mononeuropathy of bilateral lower limbs: Secondary | ICD-10-CM

## 2015-12-06 DIAGNOSIS — R220 Localized swelling, mass and lump, head: Secondary | ICD-10-CM | POA: Insufficient documentation

## 2015-12-06 DIAGNOSIS — G5792 Unspecified mononeuropathy of left lower limb: Secondary | ICD-10-CM

## 2015-12-06 DIAGNOSIS — E039 Hypothyroidism, unspecified: Secondary | ICD-10-CM

## 2015-12-06 DIAGNOSIS — G479 Sleep disorder, unspecified: Secondary | ICD-10-CM | POA: Insufficient documentation

## 2015-12-06 DIAGNOSIS — Z79899 Other long term (current) drug therapy: Secondary | ICD-10-CM | POA: Insufficient documentation

## 2015-12-06 DIAGNOSIS — E559 Vitamin D deficiency, unspecified: Secondary | ICD-10-CM

## 2015-12-06 DIAGNOSIS — F329 Major depressive disorder, single episode, unspecified: Secondary | ICD-10-CM | POA: Insufficient documentation

## 2015-12-06 DIAGNOSIS — G629 Polyneuropathy, unspecified: Secondary | ICD-10-CM | POA: Insufficient documentation

## 2015-12-06 DIAGNOSIS — F17210 Nicotine dependence, cigarettes, uncomplicated: Secondary | ICD-10-CM | POA: Insufficient documentation

## 2015-12-06 DIAGNOSIS — Z658 Other specified problems related to psychosocial circumstances: Secondary | ICD-10-CM

## 2015-12-06 DIAGNOSIS — F419 Anxiety disorder, unspecified: Secondary | ICD-10-CM | POA: Insufficient documentation

## 2015-12-06 DIAGNOSIS — F418 Other specified anxiety disorders: Secondary | ICD-10-CM

## 2015-12-06 DIAGNOSIS — Z7982 Long term (current) use of aspirin: Secondary | ICD-10-CM | POA: Insufficient documentation

## 2015-12-06 DIAGNOSIS — K589 Irritable bowel syndrome without diarrhea: Secondary | ICD-10-CM

## 2015-12-06 DIAGNOSIS — G5791 Unspecified mononeuropathy of right lower limb: Secondary | ICD-10-CM

## 2015-12-06 LAB — TSH: TSH: 1.76 mIU/L

## 2015-12-06 MED ORDER — GABAPENTIN 300 MG PO CAPS: 900.0000 mg | ORAL_CAPSULE | Freq: Three times a day (TID) | ORAL | Status: DC

## 2015-12-06 MED ORDER — LORAZEPAM 0.5 MG PO TABS: 0.5000 mg | ORAL_TABLET | ORAL | Status: DC | PRN

## 2015-12-06 MED ORDER — AMITRIPTYLINE HCL 50 MG PO TABS: 50.0000 mg | ORAL_TABLET | Freq: Every day | ORAL | Status: DC

## 2015-12-06 MED ORDER — ONDANSETRON HCL 4 MG PO TABS: 4.0000 mg | ORAL_TABLET | Freq: Three times a day (TID) | ORAL | Status: DC | PRN

## 2015-12-06 MED ORDER — ACETAMINOPHEN-CODEINE #3 300-30 MG PO TABS: 1.0000 | ORAL_TABLET | Freq: Three times a day (TID) | ORAL | Status: DC | PRN

## 2015-12-06 MED ORDER — DICYCLOMINE HCL 10 MG PO CAPS: 10.0000 mg | ORAL_CAPSULE | Freq: Four times a day (QID) | ORAL | Status: DC

## 2015-12-06 MED ORDER — LEVOTHYROXINE SODIUM 50 MCG PO TABS: 50.0000 ug | ORAL_TABLET | Freq: Every day | ORAL | Status: DC

## 2015-12-06 MED ORDER — DOXYCYCLINE HYCLATE 100 MG PO TABS: 100.0000 mg | ORAL_TABLET | Freq: Two times a day (BID) | ORAL | Status: DC

## 2015-12-06 MED ORDER — VITAMIN D (ERGOCALCIFEROL) 1.25 MG (50000 UNIT) PO CAPS: 50000.0000 [IU] | ORAL_CAPSULE | ORAL | Status: DC

## 2015-12-06 MED FILL — DICYCLOMINE 10 MG CAPSULE: 10 | 30 days supply | Qty: 120 | Fill #0

## 2015-12-06 MED FILL — ACETAMINOPHEN/COD #3 TABLET: 300-30 | 30 days supply | Qty: 90 | Fill #0

## 2015-12-06 MED FILL — ONDANSETRON HCL 4 MG TABLET: 4 | 30 days supply | Qty: 90 | Fill #0

## 2015-12-06 MED FILL — LORazepam 0.5 MG TABS: 0.5 | 5 days supply | Qty: 30 | Fill #0

## 2015-12-06 MED FILL — DOXYCYCLINE 100 MG TABLET: 100 | 14 days supply | Qty: 28 | Fill #0

## 2015-12-06 MED FILL — GABAPENTIN 300 MG CAPSULE: 300 | 30 days supply | Qty: 270 | Fill #0

## 2015-12-06 MED FILL — VIT D2 1.25 MG (50,000 UNIT: 1.25 MG | 28 days supply | Qty: 4 | Fill #0

## 2015-12-06 NOTE — Progress Notes (Signed)
ASSESSMENT: Pt currently experiencing psychosocial stressors, needs to f/u with PCP; would benefit from community resources, and may benefit from supportive counseling to cope with psychosocial stressors. Would benefit also from f/u visit with [facility] to assess further.  Stage of Change: contemplative  PLAN: 1. F/U with behavioral health consultant in one month 2. Psychiatric Medications: Ativan 3. Behavioral recommendation(s):   -Consider community resources for help with utilities and food resources, as discussed in office  -Go to social security office to begin disability paperwork  SUBJECTIVE: Pt. referred by Dr Adrian Blackwater for community resources:  Pt. reports the following symptoms/concerns: Pt states that her primary concern is obtaining disability, keeping utilities on, and food insecurity. Duration of problem: Over one month Severity: mild  OBJECTIVE: Orientation & Cognition: Oriented x3. Thought processes normal and appropriate to situation. Mood: appropriate. Affect: appropriate Appearance: appropriate Risk of harm to self or others: no known risk of harm to self or others Substance use: tobacco Assessments administered: none  Diagnosis: Psychosocial stressors CPT Code: Z65.9 -------------------------------------------- Other(s) present in the room: husband/boyfriend/female friend  Time spent with patient in exam room: 15 minutes

## 2015-12-06 NOTE — Patient Instructions (Addendum)
Jamie Allen was seen today for medication management.  Diagnoses and all orders for this visit:  Neuropathic pain, leg, bilateral -     Ambulatory referral to Pain Clinic -     gabapentin (NEURONTIN) 300 MG capsule; Take 3 capsules (900 mg total) by mouth 3 (three) times daily. -     amitriptyline (ELAVIL) 50 MG tablet; Take 1 tablet (50 mg total) by mouth at bedtime. -     doxycycline (VIBRA-TABS) 100 MG tablet; Take 1 tablet (100 mg total) by mouth 2 (two) times daily.  Hypothyroidism, unspecified hypothyroidism type -     TSH -     levothyroxine (SYNTHROID, LEVOTHROID) 50 MCG tablet; Take 1 tablet (50 mcg total) by mouth daily before breakfast.  IBS (irritable bowel syndrome) -     ondansetron (ZOFRAN) 4 MG tablet; Take 1 tablet (4 mg total) by mouth every 8 (eight) hours as needed for nausea or vomiting. -     dicyclomine (BENTYL) 10 MG capsule; Take 1 capsule (10 mg total) by mouth 4 (four) times daily. Reported on 12/06/2015  Vitamin D deficiency -     Vitamin D, Ergocalciferol, (DRISDOL) 50000 units CAPS capsule; Take 1 capsule (50,000 Units total) by mouth every 7 (seven) days. For 4 weeks  Anxiety and depression -     LORazepam (ATIVAN) 0.5 MG tablet; Take 1 tablet (0.5 mg total) by mouth every 4 (four) hours as needed for anxiety.    Please start doxycyline 100 mg twice daily for next 2 weeks. Take with full glass of water or milk and sit up for at least 30 minutes after taking it. This is for possible lyme disease.  F/u in 4 weeks for neuropathy   Dr. Adrian Blackwater

## 2015-12-06 NOTE — Progress Notes (Signed)
Subjective:  Patient ID: Jamie Allen, female    DOB: 1971/10/09  Age: 45 y.o. MRN: [id number]  CC: Medication Management    Jamie Allen presents for    1. Peripheral neuropathy: still with painful neuropathy in both legs. Has recently fractured her ankle. Neurology assessment revealed elevated Lyme titer. She request med refills. She would like treatment for lyme disease in case this is the cause of her neuropathy. She is unemployed and uninsured, previously worked as a Educational psychologist. Request letter of disability. She denies ETOH.   2. Hypothyroidism: taking synthroid. Facial swelling has improved. She tremor. Has sleep disturbance. No weight gain.   Social History  Substance Use Topics  . Smoking status: Current Some Day Smoker -- 0.25 packs/day for 20 years    Types: Cigarettes  . Smokeless tobacco: Never Used  . Alcohol Use: No     Comment: Quit 01/21/15   Outpatient Prescriptions Prior to Visit  Medication Sig Dispense Refill  . calcium carbonate (TUMS) 500 MG chewable tablet Chew 2 tablets by mouth daily.    Marland Kitchen esomeprazole (NEXIUM) 40 MG capsule Take 1 capsule (40 mg total) by mouth daily. For acid reflux    . ibuprofen (ADVIL,MOTRIN) 200 MG tablet Take 800 mg by mouth every 6 (six) hours as needed for moderate pain. Reported on 12/06/2015    . levothyroxine (SYNTHROID, LEVOTHROID) 50 MCG tablet Take 1 tablet (50 mcg total) by mouth daily before breakfast. 30 tablet 0  . Multiple Vitamin (MULTIVITAMIN) tablet Take 1 tablet by mouth daily.    . ondansetron (ZOFRAN) 4 MG tablet Take 1 tablet (4 mg total) by mouth every 8 (eight) hours as needed for nausea or vomiting. 90 tablet 0  . potassium chloride SA (K-DUR,KLOR-CON) 20 MEQ tablet Take 2 tablets (40 mEq total) by mouth daily. 20 tablet 0  . thiamine (VITAMIN B-1) 50 MG tablet Take 1 tablet (50 mg total) by mouth daily. 30 tablet 5  . Vitamin D, Ergocalciferol, (DRISDOL) 50000 UNITS CAPS capsule Take 1 capsule (50,000  Units total) by mouth every 7 (seven) days. For 8 weeks 4 capsule 2  . amitriptyline (ELAVIL) 50 MG tablet Take 1 tablet (50 mg total) by mouth at bedtime. (Patient not taking: Reported on 12/06/2015)    . aspirin 325 MG EC tablet Take 1 tablet (325 mg total) by mouth daily. (Patient not taking: Reported on 12/06/2015) 30 tablet 0  . dicyclomine (BENTYL) 10 MG capsule Take 10 mg by mouth 4 (four) times daily. Reported on 12/06/2015  1  . folic acid (FOLVITE) 1 MG tablet Take 1 tablet (1 mg total) by mouth daily. (Patient not taking: Reported on 12/06/2015) 30 tablet 3  . gabapentin (NEURONTIN) 300 MG capsule Take 3 capsules (900 mg total) by mouth 3 (three) times daily. (Patient not taking: Reported on 12/06/2015) 180 capsule 3  . HYDROcodone-acetaminophen (NORCO) 7.5-325 MG tablet Take 1-2 tablets by mouth every 4 (four) hours as needed for severe pain. (Patient not taking: Reported on 12/06/2015) 30 tablet 0  . LORazepam (ATIVAN) 0.5 MG tablet Take 1 tablet (0.5 mg total) by mouth every 4 (four) hours as needed for anxiety. (Patient not taking: Reported on 12/06/2015) 30 tablet 0   No facility-administered medications prior to visit.    ROS Review of Systems  Constitutional: Negative for fever and chills.  Eyes: Negative for visual disturbance.  Respiratory: Negative for shortness of breath.   Cardiovascular: Negative for chest pain.  Gastrointestinal: Negative for abdominal  pain and blood in stool.  Musculoskeletal: Positive for myalgias and arthralgias. Negative for back pain.  Skin: Negative for rash.  Allergic/Immunologic: Negative for immunocompromised state.  Neurological: Positive for numbness.  Hematological: Negative for adenopathy. Does not bruise/bleed easily.  Psychiatric/Behavioral: Positive for sleep disturbance. Negative for suicidal ideas and dysphoric mood. The patient is nervous/anxious.     Objective:  BP 138/91 mmHg  Pulse 99  Temp(Src) 98.6 F (37 C) (Oral)  Resp 16   Ht 5\' 1"  (1.549 m)  Wt 128 lb (58.06 kg)  BMI 24.20 kg/m2  SpO2 100%  BP/Weight 12/06/2015 10/18/2015 [id number]  Systolic BP 0000000 [id number] 0000000  Diastolic BP 91 72 83  Wt. (Lbs) 128 - -  BMI 24.2 - -   Wt Readings from Last 3 Encounters:  12/06/15 128 lb (58.06 kg)  10/11/15 132 lb (59.875 kg)  10/06/15 132 lb (59.875 kg)    Physical Exam  Constitutional: She is oriented to person, place, and time. She appears well-developed and well-nourished. No distress.  HENT:  Head: Normocephalic and atraumatic.  Cardiovascular: Normal rate, regular rhythm, normal heart sounds and intact distal pulses.   Pulmonary/Chest: Effort normal and breath sounds normal.  Musculoskeletal: She exhibits no edema.       Right ankle: She exhibits decreased range of motion.       Left ankle: She exhibits decreased range of motion. No tenderness.  Neurological: She is alert and oriented to person, place, and time.  Skin: Skin is warm and dry. No rash noted.  Psychiatric: She has a normal mood and affect.   Lab Results  Component Value Date   TSH 10.390* 10/03/2015   Depression screen [facility] 2/9 12/06/2015 09/20/2015 04/19/2014  Decreased Interest 2 0 1  Down, Depressed, Hopeless 1 0 2  PHQ - 2 Score 3 0 3  Altered sleeping 2 - -  Tired, decreased energy 2 - -  Change in appetite 0 - -  Feeling bad or failure about yourself  1 - -  Trouble concentrating 0 - -  Moving slowly or fidgety/restless 2 - -  Suicidal thoughts 0 - -  PHQ-9 Score 10 - -   GAD 7 : Generalized Anxiety Score 12/06/2015  Nervous, Anxious, on Edge 3  Control/stop worrying 2  Worry too much - different things 1  Trouble relaxing 2  Restless 2  Easily annoyed or irritable 2  Afraid - awful might happen 1  Total GAD 7 Score 13       Assessment & Plan:  Jamie Allen was seen today for medication management.  Diagnoses and all orders for this visit:  Neuropathic pain, leg, bilateral -     Ambulatory referral to Pain Clinic -      gabapentin (NEURONTIN) 300 MG capsule; Take 3 capsules (900 mg total) by mouth 3 (three) times daily. -     amitriptyline (ELAVIL) 50 MG tablet; Take 1 tablet (50 mg total) by mouth at bedtime. -     doxycycline (VIBRA-TABS) 100 MG tablet; Take 1 tablet (100 mg total) by mouth 2 (two) times daily. -     acetaminophen-codeine (TYLENOL #3) 300-30 MG tablet; Take 1 tablet by mouth every 8 (eight) hours as needed for moderate pain.  Hypothyroidism, unspecified hypothyroidism type -     TSH -     levothyroxine (SYNTHROID, LEVOTHROID) 50 MCG tablet; Take 1 tablet (50 mcg total) by mouth daily before breakfast.  IBS (irritable bowel syndrome) -     ondansetron (ZOFRAN)  4 MG tablet; Take 1 tablet (4 mg total) by mouth every 8 (eight) hours as needed for nausea or vomiting. -     dicyclomine (BENTYL) 10 MG capsule; Take 1 capsule (10 mg total) by mouth 4 (four) times daily. Reported on 12/06/2015  Vitamin D deficiency -     Vitamin D, Ergocalciferol, (DRISDOL) 50000 units CAPS capsule; Take 1 capsule (50,000 Units total) by mouth every 7 (seven) days. For 4 weeks  Anxiety and depression -     LORazepam (ATIVAN) 0.5 MG tablet; Take 1 tablet (0.5 mg total) by mouth every 4 (four) hours as needed for anxiety.   Follow-up: No Follow-up on file.   Boykin Nearing MD

## 2015-12-06 NOTE — Progress Notes (Signed)
Medicine refills  Lab results  Referral to pain management  Pain scale# 8 Tobacco user 5 cigarette per day  No suicidal thoughts in the past two weeks

## 2015-12-06 NOTE — Assessment & Plan Note (Signed)
A: painful neuropathy with elevated lyme titers Possible latent lyme disease  P: refiled gabapentin, elavil Tylenol #3 for pain control Doxy course

## 2015-12-07 ENCOUNTER — Other Ambulatory Visit: Payer: Self-pay | Admitting: Adult Health

## 2015-12-07 ENCOUNTER — Telehealth: Payer: Self-pay | Admitting: Family Medicine

## 2015-12-07 NOTE — Telephone Encounter (Signed)
Pt was seen yesterday and she forgot to give the prescription for Levothyroxine 50 mcg . Pt use [facility] . Please, call patient when rx is ready thank you

## 2015-12-13 MED FILL — ?LEVOTHYROXINE 50 MCG TABLE: 50 | 30 days supply | Qty: 30 | Fill #0

## 2015-12-19 ENCOUNTER — Other Ambulatory Visit: Payer: Self-pay | Admitting: Family Medicine

## 2015-12-31 ENCOUNTER — Encounter: Payer: Self-pay | Admitting: Adult Health

## 2015-12-31 ENCOUNTER — Telehealth: Payer: Self-pay | Admitting: Family Medicine

## 2015-12-31 ENCOUNTER — Other Ambulatory Visit: Payer: Self-pay | Admitting: Family Medicine

## 2015-12-31 DIAGNOSIS — F32A Depression, unspecified: Secondary | ICD-10-CM

## 2015-12-31 DIAGNOSIS — F419 Anxiety disorder, unspecified: Secondary | ICD-10-CM

## 2015-12-31 DIAGNOSIS — G5793 Unspecified mononeuropathy of bilateral lower limbs: Secondary | ICD-10-CM

## 2015-12-31 DIAGNOSIS — F329 Major depressive disorder, single episode, unspecified: Secondary | ICD-10-CM

## 2015-12-31 MED ORDER — ACETAMINOPHEN-CODEINE #3 300-30 MG PO TABS: 1.0000 | ORAL_TABLET | Freq: Three times a day (TID) | ORAL | Status: DC | PRN

## 2015-12-31 MED ORDER — LORAZEPAM 0.5 MG PO TABS: 0.5000 mg | ORAL_TABLET | ORAL | Status: DC | PRN

## 2015-12-31 NOTE — Telephone Encounter (Signed)
Patient called to put in a request for refills on Tylenol #3 and Lorazepam....please follow up with patient

## 2015-12-31 NOTE — Telephone Encounter (Signed)
meds ready for pick up Please inform patient

## 2015-12-31 NOTE — Telephone Encounter (Signed)
Pt notified Rx at front office ready to be pickup 
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
## 2016-01-01 MED FILL — ACETAMINOPHEN/COD #3 TABLET: 300-30 | 30 days supply | Qty: 90 | Fill #0

## 2016-01-06 ENCOUNTER — Other Ambulatory Visit: Payer: Self-pay | Admitting: Adult Health

## 2016-01-15 ENCOUNTER — Other Ambulatory Visit: Payer: Self-pay | Admitting: Family Medicine

## 2016-01-15 MED FILL — LEVOTHYROXINE 50 MCG TABLET: 50 | 30 days supply | Qty: 30 | Fill #0

## 2016-01-15 MED FILL — GABAPENTIN 300 MG CAPSULE: 300 | 30 days supply | Qty: 270 | Fill #1

## 2016-01-17 ENCOUNTER — Ambulatory Visit: Payer: Self-pay | Attending: Family Medicine

## 2016-01-17 ENCOUNTER — Ambulatory Visit: Payer: Self-pay | Admitting: Neurology

## 2016-01-17 ENCOUNTER — Telehealth: Payer: Self-pay | Admitting: Family Medicine

## 2016-01-17 NOTE — Telephone Encounter (Signed)
Patient came ine requesting a medication refill for Lorazepam Please follow up.

## 2016-01-18 NOTE — Telephone Encounter (Signed)
LVM to return call   Rx was refilled on 12/31/2015  Pt picked up Rx, Rx log sign by pt

## 2016-01-21 ENCOUNTER — Telehealth: Payer: Self-pay | Admitting: Family Medicine

## 2016-01-21 NOTE — Telephone Encounter (Signed)
Patient came into office requesting letter for disability , patient states she requested letter during visit. Patient states letter is needed for disability and to apply for food stamps. Please f/u with patient

## 2016-01-22 ENCOUNTER — Other Ambulatory Visit: Payer: Self-pay | Admitting: Family Medicine

## 2016-01-22 DIAGNOSIS — F419 Anxiety disorder, unspecified: Principal | ICD-10-CM

## 2016-01-22 DIAGNOSIS — F329 Major depressive disorder, single episode, unspecified: Secondary | ICD-10-CM

## 2016-01-22 MED ORDER — LORAZEPAM 0.5 MG PO TABS: 0.5000 mg | ORAL_TABLET | ORAL | Status: DC | PRN

## 2016-01-22 NOTE — Telephone Encounter (Signed)
Ativan refilled and ready for pick up Please remind patient that she should not mix ativan (a Benzo) with an opoid pain medicine (this includes tramadol, tylenol #3, Vicodin) due to risk of respiratory sedation.   Letter is ready for pick up

## 2016-01-22 NOTE — Telephone Encounter (Signed)
LVM to return call  Rx and letter at front office ready to be pick up

## 2016-01-23 ENCOUNTER — Telehealth: Payer: Self-pay | Admitting: Family Medicine

## 2016-01-23 NOTE — Telephone Encounter (Signed)
Patients pharmacy called requesting a medication refill for tylenol 3.

## 2016-01-23 NOTE — Telephone Encounter (Signed)
Pt notified Rx and letter at front office  Advice not to take ativan with pain medicine like tylenol#3, Vicodin due to respiratory sedation  Pt verbalized understanding

## 2016-01-24 MED FILL — LORazepam 0.5 MG TABS: 0.5 | 5 days supply | Qty: 30 | Fill #0

## 2016-01-24 NOTE — Telephone Encounter (Signed)
Pt stated did not call for refills  Jamie Allen not want to take it, make her itchy and did not like the feeling  Per pt removed from medication list

## 2016-01-31 ENCOUNTER — Telehealth: Payer: Self-pay | Admitting: Clinical

## 2016-01-31 ENCOUNTER — Ambulatory Visit: Payer: Self-pay | Admitting: Family Medicine

## 2016-01-31 NOTE — Telephone Encounter (Signed)
Attempt to f/u; left HIPPA-compliant message to return call to [place] at [facility] at [phone]

## 2016-02-02 ENCOUNTER — Emergency Department (HOSPITAL_COMMUNITY)
Admission: EM | Admit: 2016-02-02 | Discharge: 2016-02-02 | Disposition: A | Discharge status: Home / Self Care | Payer: Self-pay | Attending: Emergency Medicine | Admitting: Emergency Medicine

## 2016-02-02 ENCOUNTER — Emergency Department (HOSPITAL_COMMUNITY): Payer: Self-pay

## 2016-02-02 ENCOUNTER — Encounter (HOSPITAL_COMMUNITY): Payer: Self-pay | Admitting: *Deleted

## 2016-02-02 DIAGNOSIS — Y9289 Other specified places as the place of occurrence of the external cause: Secondary | ICD-10-CM | POA: Insufficient documentation

## 2016-02-02 DIAGNOSIS — M79671 Pain in right foot: Secondary | ICD-10-CM

## 2016-02-02 DIAGNOSIS — Y9389 Activity, other specified: Secondary | ICD-10-CM | POA: Insufficient documentation

## 2016-02-02 DIAGNOSIS — Y998 Other external cause status: Secondary | ICD-10-CM | POA: Insufficient documentation

## 2016-02-02 DIAGNOSIS — G629 Polyneuropathy, unspecified: Secondary | ICD-10-CM | POA: Insufficient documentation

## 2016-02-02 DIAGNOSIS — Z7982 Long term (current) use of aspirin: Secondary | ICD-10-CM | POA: Insufficient documentation

## 2016-02-02 DIAGNOSIS — K219 Gastro-esophageal reflux disease without esophagitis: Secondary | ICD-10-CM | POA: Insufficient documentation

## 2016-02-02 DIAGNOSIS — I10 Essential (primary) hypertension: Secondary | ICD-10-CM | POA: Insufficient documentation

## 2016-02-02 DIAGNOSIS — S99921A Unspecified injury of right foot, initial encounter: Secondary | ICD-10-CM | POA: Insufficient documentation

## 2016-02-02 DIAGNOSIS — F419 Anxiety disorder, unspecified: Secondary | ICD-10-CM | POA: Insufficient documentation

## 2016-02-02 DIAGNOSIS — X58XXXA Exposure to other specified factors, initial encounter: Secondary | ICD-10-CM | POA: Insufficient documentation

## 2016-02-02 DIAGNOSIS — D649 Anemia, unspecified: Secondary | ICD-10-CM | POA: Insufficient documentation

## 2016-02-02 DIAGNOSIS — Z79899 Other long term (current) drug therapy: Secondary | ICD-10-CM | POA: Insufficient documentation

## 2016-02-02 DIAGNOSIS — F17210 Nicotine dependence, cigarettes, uncomplicated: Secondary | ICD-10-CM | POA: Insufficient documentation

## 2016-02-02 MED ORDER — HYDROCODONE-ACETAMINOPHEN 5-325 MG PO TABS
1.0000 | ORAL_TABLET | Freq: Once | ORAL | Status: AC
  Administered 2016-02-02: 1 via ORAL
  Filled 2016-02-02: qty 1

## 2016-02-02 MED ORDER — HYDROCODONE-ACETAMINOPHEN 5-325 MG PO TABS: 1.0000 | ORAL_TABLET | ORAL | Status: DC | PRN

## 2016-02-02 NOTE — ED Notes (Signed)
Pt reports Rtfoot pain.
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
## 2016-02-02 NOTE — Discharge Instructions (Signed)
Your x-ray today showed no evidence of fracture. However, there does appear to be some spots of osteopenia, or bone thinning/breakdown. Please call Dr. Adrian Blackwater' office to schedule a follow up appointment next week. Return to the emergency room for new or worsening symptoms.

## 2016-02-14 ENCOUNTER — Ambulatory Visit: Payer: Self-pay | Attending: Family Medicine | Admitting: Family Medicine

## 2016-02-14 ENCOUNTER — Encounter: Payer: Self-pay | Admitting: Family Medicine

## 2016-02-14 VITALS — BP 102/70 | HR 107 | Temp 98.6°F | Resp 16 | Ht 61.0 in | Wt 136.0 lb

## 2016-02-14 DIAGNOSIS — Z7982 Long term (current) use of aspirin: Secondary | ICD-10-CM | POA: Insufficient documentation

## 2016-02-14 DIAGNOSIS — G5791 Unspecified mononeuropathy of right lower limb: Secondary | ICD-10-CM

## 2016-02-14 DIAGNOSIS — G5793 Unspecified mononeuropathy of bilateral lower limbs: Secondary | ICD-10-CM

## 2016-02-14 DIAGNOSIS — G5792 Unspecified mononeuropathy of left lower limb: Secondary | ICD-10-CM

## 2016-02-14 DIAGNOSIS — G629 Polyneuropathy, unspecified: Secondary | ICD-10-CM | POA: Insufficient documentation

## 2016-02-14 DIAGNOSIS — Z79899 Other long term (current) drug therapy: Secondary | ICD-10-CM | POA: Insufficient documentation

## 2016-02-14 DIAGNOSIS — E559 Vitamin D deficiency, unspecified: Secondary | ICD-10-CM | POA: Insufficient documentation

## 2016-02-14 DIAGNOSIS — F17210 Nicotine dependence, cigarettes, uncomplicated: Secondary | ICD-10-CM | POA: Insufficient documentation

## 2016-02-14 MED ORDER — PREGABALIN 75 MG PO CAPS: 75.0000 mg | ORAL_CAPSULE | Freq: Two times a day (BID) | ORAL | Status: DC

## 2016-02-14 MED ORDER — CALCIUM CITRATE 200 MG PO TABS: 400.0000 mg | ORAL_TABLET | Freq: Every day | ORAL | Status: DC

## 2016-02-14 MED ORDER — DIAZEPAM 5 MG PO TABS: 5.0000 mg | ORAL_TABLET | Freq: Two times a day (BID) | ORAL | Status: DC | PRN

## 2016-02-14 MED ORDER — VITAMIN D (ERGOCALCIFEROL) 1.25 MG (50000 UNIT) PO CAPS: 50000.0000 [IU] | ORAL_CAPSULE | ORAL | Status: DC

## 2016-02-14 MED FILL — GABAPENTIN 300 MG CAPSULE: 300 | 20 days supply | Qty: 180 | Fill #2

## 2016-02-14 MED FILL — diazePAM 5 MG TABS: 5 | 30 days supply | Qty: 60 | Fill #0

## 2016-02-14 NOTE — Patient Instructions (Addendum)
Diagnoses and all orders for this visit:  Neuropathic pain, leg, bilateral -     diazepam (VALIUM) 5 MG tablet; Take 1 tablet (5 mg total) by mouth every 12 (twelve) hours as needed for anxiety. -     pregabalin (LYRICA) 75 MG capsule; Take 1 capsule (75 mg total) by mouth 2 (two) times daily.  Vitamin D insufficiency -     Calcium Citrate 200 MG TABS; Take 2 tablets (400 mg total) by mouth daily. -     Vitamin D, Ergocalciferol, (DRISDOL) 50000 units CAPS capsule; Take 1 capsule (50,000 Units total) by mouth every 30 (thirty) days.  Other orders -     Discontinue: Vitamin D, Ergocalciferol, (DRISDOL) 50000 units CAPS capsule; Take 1 capsule (50,000 Units total) by mouth every 30 (thirty) days. For 4 weeks -     Discontinue: Calcium Citrate 200 MG TABS; Take 2 tablets (400 mg total) by mouth daily.    F/u in 4-6 weeks for neuropathy and TSH check   Dr. Adrian Blackwater

## 2016-02-14 NOTE — Progress Notes (Signed)
F/U Neuropathy Same as last visit  No pain today  Tobacco user 6 cigarette per day  No suicidal thought since last visit  Medication refills

## 2016-02-14 NOTE — Assessment & Plan Note (Signed)
Improved  Stop ativan Changed to valium Continue gabapentin but ordered lyrica that will replace gabapentin if available

## 2016-02-14 NOTE — Progress Notes (Signed)
Patient ID: Jamie Allen, female   DOB: [birth year], 45 y.o.   MRN: [id number]   Subjective:  Patient ID: Jamie Allen, female    DOB: [birth year]  Age: 45 y.o. MRN: [id number]  CC: No chief complaint on file.    Kariel Ladajah Zahm presents for    1. Peripheral neuropathy: improving. Doxycyline did not help. Ativan helps with associated anxiety but does not last long. Having trouble sleeping. Numbness in R foot is greater than L.   2. Hypothyroidism: taking synthroid. Facial swelling has improved. No tremor. Has sleep disturbance. No weight gain.   Social History  Substance Use Topics  . Smoking status: Current Some Day Smoker -- 0.25 packs/day for 20 years    Types: Cigarettes  . Smokeless tobacco: Never Used  . Alcohol Use: No     Comment: Quit 01/21/15   Outpatient Prescriptions Prior to Visit  Medication Sig Dispense Refill  . acetaminophen-codeine (TYLENOL #3) 300-30 MG tablet Take 1 tablet by mouth every 8 (eight) hours as needed for moderate pain. 90 tablet 0  . amitriptyline (ELAVIL) 50 MG tablet Take 1 tablet (50 mg total) by mouth at bedtime.    Marland Kitchen aspirin 325 MG EC tablet Take 1 tablet (325 mg total) by mouth daily. 30 tablet 0  . calcium carbonate (TUMS) 500 MG chewable tablet Chew 2 tablets by mouth daily.    Marland Kitchen dicyclomine (BENTYL) 10 MG capsule Take 1 capsule (10 mg total) by mouth 4 (four) times daily. Reported on 12/06/2015 120 capsule 3  . doxycycline (VIBRA-TABS) 100 MG tablet Take 1 tablet (100 mg total) by mouth 2 (two) times daily. 28 tablet 0  . esomeprazole (NEXIUM) 40 MG capsule Take 1 capsule (40 mg total) by mouth daily. For acid reflux    . folic acid (FOLVITE) 1 MG tablet Take 1 tablet (1 mg total) by mouth daily. 30 tablet 3  . gabapentin (NEURONTIN) 300 MG capsule Take 3 capsules (900 mg total) by mouth 3 (three) times daily. 180 capsule 3  . HYDROcodone-acetaminophen (NORCO/VICODIN) 5-325 MG tablet Take 1 tablet by mouth every 4 (four) hours as  needed. 10 tablet 0  . levothyroxine (SYNTHROID, LEVOTHROID) 50 MCG tablet TAKE 1 TABLET BY MOUTH DAILY BEFORE BREAKFAST 30 tablet 2  . LORazepam (ATIVAN) 0.5 MG tablet Take 1 tablet (0.5 mg total) by mouth every 4 (four) hours as needed for anxiety. 30 tablet 0  . Multiple Vitamin (MULTIVITAMIN) tablet Take 1 tablet by mouth daily.    . ondansetron (ZOFRAN) 4 MG tablet Take 1 tablet (4 mg total) by mouth every 8 (eight) hours as needed for nausea or vomiting. 90 tablet 3  . potassium chloride SA (K-DUR,KLOR-CON) 20 MEQ tablet Take 2 tablets (40 mEq total) by mouth daily. 20 tablet 0  . thiamine (VITAMIN B-1) 50 MG tablet Take 1 tablet (50 mg total) by mouth daily. 30 tablet 5  . Vitamin D, Ergocalciferol, (DRISDOL) 50000 units CAPS capsule Take 1 capsule (50,000 Units total) by mouth every 7 (seven) days. For 4 weeks 4 capsule 2  . ibuprofen (ADVIL,MOTRIN) 200 MG tablet Take 800 mg by mouth every 6 (six) hours as needed for moderate pain. Reported on 12/06/2015     No facility-administered medications prior to visit.    ROS Review of Systems  Constitutional: Negative for fever and chills.  Eyes: Negative for visual disturbance.  Respiratory: Negative for shortness of breath.   Cardiovascular: Negative for chest pain.  Gastrointestinal: Negative for nausea,  vomiting, abdominal pain, diarrhea and blood in stool.  Musculoskeletal: Positive for myalgias and arthralgias. Negative for back pain.  Skin: Negative for rash.  Allergic/Immunologic: Negative for immunocompromised state.  Neurological: Positive for numbness.  Hematological: Negative for adenopathy. Does not bruise/bleed easily.  Psychiatric/Behavioral: Positive for sleep disturbance. Negative for suicidal ideas and dysphoric mood. The patient is nervous/anxious.     Objective:  BP 102/70 mmHg  Pulse 107  Temp(Src) 98.6 F (37 C) (Oral)  Resp 16  Ht 5\' 1"  (1.549 m)  Wt 136 lb (61.689 kg)  BMI 25.71 kg/m2  SpO2 97%  BP/Weight  02/14/2016 02/02/2016 [id number]  Systolic BP A999333 [id number]  Diastolic BP 70 A999333 91  Wt. (Lbs) 136 145 128  BMI 25.71 27.41 24.2   Wt Readings from Last 3 Encounters:  02/14/16 136 lb (61.689 kg)  02/02/16 145 lb (65.772 kg)  12/06/15 128 lb (58.06 kg)    Physical Exam  Constitutional: She is oriented to person, place, and time. She appears well-developed and well-nourished. No distress.  HENT:  Head: Normocephalic and atraumatic.  Cardiovascular: Normal rate, regular rhythm, normal heart sounds and intact distal pulses.   Pulmonary/Chest: Effort normal and breath sounds normal.  Musculoskeletal: She exhibits no edema.       Right ankle: She exhibits decreased range of motion.       Left ankle: She exhibits decreased range of motion. No tenderness.  Neurological: She is alert and oriented to person, place, and time.  Skin: Skin is warm and dry. No rash noted.  Psychiatric: She has a normal mood and affect.   Lab Results  Component Value Date   TSH 1.76 12/06/2015   Depression screen [facility] 2/9 12/06/2015 09/20/2015 04/19/2014  Decreased Interest 2 0 1  Down, Depressed, Hopeless 1 0 2  PHQ - 2 Score 3 0 3  Altered sleeping 2 - -  Tired, decreased energy 2 - -  Change in appetite 0 - -  Feeling bad or failure about yourself  1 - -  Trouble concentrating 0 - -  Moving slowly or fidgety/restless 2 - -  Suicidal thoughts 0 - -  PHQ-9 Score 10 - -   GAD 7 : Generalized Anxiety Score 12/06/2015  Nervous, Anxious, on Edge 3  Control/stop worrying 2  Worry too much - different things 1  Trouble relaxing 2  Restless 2  Easily annoyed or irritable 2  Afraid - awful might happen 1  Total GAD 7 Score 13       Assessment & Plan:  Diagnoses and all orders for this visit:  Neuropathic pain, leg, bilateral -     Discontinue: diazepam (VALIUM) 5 MG tablet; Take 1 tablet (5 mg total) by mouth every 12 (twelve) hours as needed for anxiety. -     Discontinue: pregabalin (LYRICA) 75 MG  capsule; Take 1 capsule (75 mg total) by mouth 2 (two) times daily. -     pregabalin (LYRICA) 75 MG capsule; Take 1 capsule (75 mg total) by mouth 2 (two) times daily. -     diazepam (VALIUM) 5 MG tablet; Take 1 tablet (5 mg total) by mouth every 12 (twelve) hours as needed for anxiety.  Vitamin D insufficiency -     Calcium Citrate 200 MG TABS; Take 2 tablets (400 mg total) by mouth daily. -     Vitamin D, Ergocalciferol, (DRISDOL) 50000 units CAPS capsule; Take 1 capsule (50,000 Units total) by mouth every 30 (thirty) days.  Other orders -  Discontinue: Vitamin D, Ergocalciferol, (DRISDOL) 50000 units CAPS capsule; Take 1 capsule (50,000 Units total) by mouth every 30 (thirty) days. For 4 weeks -     Discontinue: Calcium Citrate 200 MG TABS; Take 2 tablets (400 mg total) by mouth daily.   Follow-up: No Follow-up on file.   Boykin Nearing MD

## 2016-02-15 ENCOUNTER — Encounter: Payer: Self-pay | Admitting: Clinical

## 2016-02-15 NOTE — Progress Notes (Signed)
  Depression screen [facility] 2/9 02/14/2016 12/06/2015 09/20/2015 04/19/2014 07/07/2013  Decreased Interest 2 2 0 1 3  Down, Depressed, Hopeless 2 1 0 2 3  PHQ - 2 Score 4 3 0 3 6  Altered sleeping 2 2 - - 3  Tired, decreased energy 2 2 - - 3  Change in appetite 2 0 - - 3  Feeling bad or failure about yourself  2 1 - - 3  Trouble concentrating 2 0 - - 3  Moving slowly or fidgety/restless 2 2 - - 3  Suicidal thoughts 0 0 - - 0  PHQ-9 Score 16 10 - - 24    GAD 7 : Generalized Anxiety Score 02/15/2016 12/06/2015  Nervous, Anxious, on Edge 1 3  Control/stop worrying 1 2  Worry too much - different things 1 1  Trouble relaxing 1 2  Restless 1 2  Easily annoyed or irritable 2 2  Afraid - awful might happen 1 1  Total GAD 7 Score 8 13

## 2016-02-28 ENCOUNTER — Telehealth: Payer: Self-pay | Admitting: *Deleted

## 2016-02-28 ENCOUNTER — Ambulatory Visit: Payer: Self-pay | Admitting: Neurology

## 2016-02-28 NOTE — Telephone Encounter (Signed)
No showed follow up appointment. 

## 2016-02-29 ENCOUNTER — Encounter: Payer: Self-pay | Admitting: Neurology

## 2016-03-06 MED FILL — ?LEVOTHYROXINE 50 MCG TABLE: 50 | 30 days supply | Qty: 30 | Fill #1

## 2016-03-06 MED FILL — GABAPENTIN 300 MG CAPSULE: 300 | 30 days supply | Qty: 270 | Fill #0

## 2016-03-07 ENCOUNTER — Telehealth: Payer: Self-pay | Admitting: Family Medicine

## 2016-03-07 NOTE — Telephone Encounter (Signed)
Patient came in needing a refill for valium. Please follow up.

## 2016-03-10 NOTE — Telephone Encounter (Signed)
contact pharmacy for refills, pt aware

## 2016-03-12 ENCOUNTER — Telehealth: Payer: Self-pay | Admitting: Family Medicine

## 2016-03-12 NOTE — Telephone Encounter (Signed)
Patient states PCP cleared her to go to the gym and use the stationary bike as therapy.Marland KitchenMarland KitchenMarland KitchenMarland Kitchenpatient would like a letter verifying this for her disability case.   Please follow up with patient

## 2016-03-13 MED FILL — diazePAM 5 MG TABS: 5 | 30 days supply | Qty: 60 | Fill #1

## 2016-03-18 NOTE — Telephone Encounter (Signed)
Patient called requesting clearance for her to able to go to gym and use bike for at least 10 mins, patient needs letter for disability. Please f/up

## 2016-03-19 NOTE — Telephone Encounter (Signed)
Patient called requesting clearance for her to able to go to gym and use bike for at least 10 mins, patient needs letter for disability. Please f/up

## 2016-03-20 NOTE — Telephone Encounter (Signed)
Letter for exercise and for disability ready for pick up

## 2016-03-25 ENCOUNTER — Emergency Department (HOSPITAL_COMMUNITY): Payer: Self-pay

## 2016-03-25 ENCOUNTER — Encounter (HOSPITAL_COMMUNITY): Payer: Self-pay | Admitting: Emergency Medicine

## 2016-03-25 ENCOUNTER — Emergency Department (HOSPITAL_COMMUNITY)
Admission: EM | Admit: 2016-03-25 | Discharge: 2016-03-25 | Disposition: A | Discharge status: Home / Self Care | Payer: Self-pay | Attending: Emergency Medicine | Admitting: Emergency Medicine

## 2016-03-25 DIAGNOSIS — R1031 Right lower quadrant pain: Secondary | ICD-10-CM | POA: Insufficient documentation

## 2016-03-25 DIAGNOSIS — I10 Essential (primary) hypertension: Secondary | ICD-10-CM | POA: Insufficient documentation

## 2016-03-25 DIAGNOSIS — R109 Unspecified abdominal pain: Secondary | ICD-10-CM

## 2016-03-25 DIAGNOSIS — F17210 Nicotine dependence, cigarettes, uncomplicated: Secondary | ICD-10-CM | POA: Insufficient documentation

## 2016-03-25 LAB — CBC
HCT: 36 % (ref 36.0–46.0)
HEMOGLOBIN: 12.4 g/dL (ref 12.0–15.0)
MCH: 37.2 pg — AB (ref 26.0–34.0)
MCHC: 34.4 g/dL (ref 30.0–36.0)
MCV: 108.1 fL — ABNORMAL HIGH (ref 78.0–100.0)
Platelets: 149 10*3/uL — ABNORMAL LOW (ref 150–400)
RBC: 3.33 MIL/uL — AB (ref 3.87–5.11)
RDW: 15.5 % (ref 11.5–15.5)
WBC: 5.6 10*3/uL (ref 4.0–10.5)

## 2016-03-25 LAB — URINALYSIS, ROUTINE W REFLEX MICROSCOPIC
Bilirubin Urine: NEGATIVE
Glucose, UA: NEGATIVE mg/dL
Hgb urine dipstick: NEGATIVE
KETONES UR: NEGATIVE mg/dL
LEUKOCYTES UA: NEGATIVE
NITRITE: NEGATIVE
PROTEIN: NEGATIVE mg/dL
Specific Gravity, Urine: 1.007 (ref 1.005–1.030)
pH: 7 (ref 5.0–8.0)

## 2016-03-25 LAB — COMPREHENSIVE METABOLIC PANEL
ALK PHOS: 163 U/L — AB (ref 38–126)
ALT: 63 U/L — ABNORMAL HIGH (ref 14–54)
ANION GAP: 9 (ref 5–15)
AST: 114 U/L — ABNORMAL HIGH (ref 15–41)
Albumin: 3.8 g/dL (ref 3.5–5.0)
BUN: 7 mg/dL (ref 6–20)
CALCIUM: 9.3 mg/dL (ref 8.9–10.3)
CO2: 23 mmol/L (ref 22–32)
Chloride: 103 mmol/L (ref 101–111)
Creatinine, Ser: 0.77 mg/dL (ref 0.44–1.00)
GFR calc non Af Amer: >60 mL/min (ref >60)
Glucose, Bld: 100 mg/dL — ABNORMAL HIGH (ref 65–99)
Potassium: 4 mmol/L (ref 3.5–5.1)
SODIUM: 135 mmol/L (ref 135–145)
Total Bilirubin: 0.8 mg/dL (ref 0.3–1.2)
Total Protein: 7.7 g/dL (ref 6.5–8.1)

## 2016-03-25 LAB — LIPASE, BLOOD: LIPASE: 28 U/L (ref 11–51)

## 2016-03-25 MED ORDER — VALACYCLOVIR HCL 1 G PO TABS: 1000.0000 mg | ORAL_TABLET | Freq: Three times a day (TID) | ORAL | Status: AC

## 2016-03-25 MED ORDER — FENTANYL CITRATE (PF) 100 MCG/2ML IJ SOLN
100.0000 ug | Freq: Once | INTRAMUSCULAR | Status: AC
  Administered 2016-03-25: 100 ug via INTRAVENOUS
  Filled 2016-03-25: qty 2

## 2016-03-25 MED ORDER — VALACYCLOVIR HCL 500 MG PO TABS
1000.0000 mg | ORAL_TABLET | Freq: Once | ORAL | Status: AC
  Administered 2016-03-25: 1000 mg via ORAL
  Filled 2016-03-25: qty 2

## 2016-03-25 MED ORDER — HYDROCODONE-ACETAMINOPHEN 5-325 MG PO TABS: 1.0000 | ORAL_TABLET | Freq: Four times a day (QID) | ORAL | Status: DC | PRN

## 2016-03-25 MED ORDER — SODIUM CHLORIDE 0.9 % IV BOLUS (SEPSIS)
1000.0000 mL | Freq: Once | INTRAVENOUS | Status: AC
  Administered 2016-03-25: 1000 mL via INTRAVENOUS

## 2016-03-25 MED FILL — HYDROCODON-APAP 5-325: 5-325 | 3 days supply | Qty: 20 | Fill #0

## 2016-03-25 MED FILL — valACYclovir HCL 1 GM TABS: 1 | 7 days supply | Qty: 21 | Fill #0

## 2016-03-25 NOTE — ED Notes (Signed)
Pt able to ambulate to BR and back to bed with stand by assist only at this time.

## 2016-03-25 NOTE — ED Notes (Signed)
Pt brought in by EMS with c/o right flank pain that started around 1130 and has progressively gotten worse since then  Pt has no nausea or vomiting but states has had diarrhea off and on  Last BM was at 2300  Pt is c/o urinary frequency but states she has to strain to go  Pt states she has increased anxiety and feels short of breath but EMS reports her lungs are clear bilaterally

## 2016-03-25 NOTE — ED Notes (Signed)
Pt given discharge instructions, verbalized understanding of need to follow up, reasons to return to the ED and medications to take at home. VSS. IV removed intact, site clean and dry. Pt escorted to exit via wheelchair without difficulty.
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
## 2016-03-26 NOTE — Telephone Encounter (Signed)
Letter was mail per Pt request

## 2016-04-07 ENCOUNTER — Telehealth: Payer: Self-pay | Admitting: Family Medicine

## 2016-04-07 DIAGNOSIS — G5793 Unspecified mononeuropathy of bilateral lower limbs: Secondary | ICD-10-CM

## 2016-04-07 MED ORDER — GABAPENTIN 300 MG PO CAPS: 900.0000 mg | ORAL_CAPSULE | Freq: Three times a day (TID) | ORAL | Status: DC

## 2016-04-07 MED FILL — GABAPENTIN 300 MG CAPSULE: 300 | 20 days supply | Qty: 180 | Fill #0

## 2016-04-07 NOTE — Telephone Encounter (Signed)
Patient called requesting an appointment ,none available but pt will be out of medication in 2 days, patient is requesting refill on gabapentin (NEURONTIN) 300 MG capsule, please f/up

## 2016-04-07 NOTE — Telephone Encounter (Signed)
Rx refill send to [facility] 

## 2016-04-11 MED FILL — diazePAM 5 MG TABS: 5 | 30 days supply | Qty: 60 | Fill #2

## 2016-04-24 ENCOUNTER — Telehealth: Payer: Self-pay | Admitting: Family Medicine

## 2016-04-24 DIAGNOSIS — G5793 Unspecified mononeuropathy of bilateral lower limbs: Secondary | ICD-10-CM

## 2016-04-24 NOTE — Telephone Encounter (Signed)
Pt. Called requesting a refill on gabapentin (NEURONTIN) 300 MG capsule. Pt. Stated that her PCP prescribed her 180 pills  And she usually gets 270 pills. Pt. Is about to run out. Please f/u with pt.

## 2016-04-25 MED ORDER — GABAPENTIN 300 MG PO CAPS: 900.0000 mg | ORAL_CAPSULE | Freq: Three times a day (TID) | ORAL | Status: DC

## 2016-04-25 NOTE — Telephone Encounter (Signed)
Yes please inform patient that new Rex for 270 pills sent in. I sent in only 180 last time accidentally which is why she ran out too soon.

## 2016-04-25 NOTE — Telephone Encounter (Signed)
Pt called stating she needs a refill of gabapentin (NEURONTIN) 300 MG capsule Pt was informed that it may be too early to refill Pt would like a call back to know if this Rx can be filled or not  Thank you

## 2016-04-25 NOTE — Telephone Encounter (Signed)
Will forward to pcp

## 2016-04-25 NOTE — Telephone Encounter (Signed)
Patient returned call....patient was informed medication was refilled and sent to pharmacy.patient stated she would pick up on Monday

## 2016-04-25 NOTE — Telephone Encounter (Signed)
Contacted patient lvm informing pt that her rx was sent into the pharmacy and if she has any questions or concerns to give [name] call

## 2016-04-28 MED FILL — GABAPENTIN 300 MG CAPSULE: 300 | 30 days supply | Qty: 270 | Fill #0

## 2016-05-19 ENCOUNTER — Encounter: Payer: Self-pay | Admitting: Family Medicine

## 2016-05-19 ENCOUNTER — Ambulatory Visit: Payer: Self-pay | Attending: Family Medicine | Admitting: Family Medicine

## 2016-05-19 VITALS — BP 114/75 | HR 110 | Temp 99.4°F | Resp 17 | Ht 61.0 in | Wt 161.0 lb

## 2016-05-19 DIAGNOSIS — E038 Other specified hypothyroidism: Secondary | ICD-10-CM | POA: Insufficient documentation

## 2016-05-19 DIAGNOSIS — R3911 Hesitancy of micturition: Secondary | ICD-10-CM | POA: Insufficient documentation

## 2016-05-19 DIAGNOSIS — R635 Abnormal weight gain: Secondary | ICD-10-CM | POA: Insufficient documentation

## 2016-05-19 DIAGNOSIS — E034 Atrophy of thyroid (acquired): Secondary | ICD-10-CM | POA: Insufficient documentation

## 2016-05-19 DIAGNOSIS — G5793 Unspecified mononeuropathy of bilateral lower limbs: Secondary | ICD-10-CM

## 2016-05-19 DIAGNOSIS — M7989 Other specified soft tissue disorders: Secondary | ICD-10-CM | POA: Insufficient documentation

## 2016-05-19 DIAGNOSIS — G5791 Unspecified mononeuropathy of right lower limb: Secondary | ICD-10-CM | POA: Insufficient documentation

## 2016-05-19 DIAGNOSIS — G5792 Unspecified mononeuropathy of left lower limb: Secondary | ICD-10-CM | POA: Insufficient documentation

## 2016-05-19 LAB — POCT URINALYSIS DIPSTICK
Bilirubin, UA: NEGATIVE
CLARITY UA: NEGATIVE
Glucose, UA: NEGATIVE
KETONES UA: NEGATIVE
Leukocytes, UA: NEGATIVE
Nitrite, UA: NEGATIVE
PH UA: 6
PROTEIN UA: NEGATIVE
RBC UA: NEGATIVE
SPEC GRAV UA: 1.01
UROBILINOGEN UA: 0.2

## 2016-05-19 LAB — POCT GLYCOSYLATED HEMOGLOBIN (HGB A1C): HEMOGLOBIN A1C: 5.3

## 2016-05-19 MED ORDER — GABAPENTIN 300 MG PO CAPS: 900.0000 mg | ORAL_CAPSULE | Freq: Three times a day (TID) | ORAL | 2 refills | Status: DC

## 2016-05-19 MED ORDER — DIAZEPAM 5 MG PO TABS: 5.0000 mg | ORAL_TABLET | Freq: Two times a day (BID) | ORAL | 2 refills | Status: DC | PRN

## 2016-05-19 MED ORDER — LEVOTHYROXINE SODIUM 50 MCG PO TABS: ORAL_TABLET | ORAL | 2 refills | Status: DC

## 2016-05-19 MED FILL — diazePAM 5 MG TABS: 5 | 30 days supply | Qty: 60 | Fill #0

## 2016-05-19 NOTE — Patient Instructions (Addendum)
Jamie Allen was seen today for peripheral neuropathy.  Diagnoses and all orders for this visit:  Hypothyroidism due to acquired atrophy of thyroid -     TSH -     levothyroxine (SYNTHROID, LEVOTHROID) 50 MCG tablet; TAKE 1 TABLET BY MOUTH DAILY BEFORE BREAKFAST  Weight gain -     HgB A1c -     COMPLETE METABOLIC PANEL WITH GFR -     CBC -     Brain natriuretic peptide  Right leg swelling -     D-dimer, quantitative (not at [facility])  Urinary hesitancy -     POCT urinalysis dipstick  Neuropathic pain, leg, bilateral -     gabapentin (NEURONTIN) 300 MG capsule; Take 3 capsules (900 mg total) by mouth 3 (three) times daily. -     diazepam (VALIUM) 5 MG tablet; Take 1 tablet (5 mg total) by mouth every 12 (twelve) hours as needed for anxiety.   Alcohol intake does cause weight gain. You have had significant problem with alcohol in the recent past. I recommend you seek mental health support to help you quit alcohol immediately. If you continue to drink I will not be able to continue to prescribed valium or any other benzo due to risk of combining benzo with alcohol.   Also evaluating for other causes for weight gain starting with blood work. Your A1c is normal. No diabetes.   Rescheduled the missed neurology appointment from 02/28/2016.   F.u in 6 weeks for weight gain   Dr. Adrian Blackwater

## 2016-05-19 NOTE — Assessment & Plan Note (Signed)
Weight gain with worsening neuropathy in setting of recurrent alcohol abuse  Plan: Alcohol cessation resources provided checking labs per orders- A1c normal  Advised patient to restart synthroid as untreated hypothyroidism causes weight gain

## 2016-05-19 NOTE — Assessment & Plan Note (Signed)
R leg swelling with calf pain D dimer ordered
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
## 2016-05-19 NOTE — Progress Notes (Signed)
Pt PHQ 9 is 24 GAD 7 13

## 2016-05-19 NOTE — Assessment & Plan Note (Signed)
Hesitancy with normal UA

## 2016-05-20 ENCOUNTER — Other Ambulatory Visit: Payer: Self-pay | Admitting: Family Medicine

## 2016-05-20 DIAGNOSIS — M7989 Other specified soft tissue disorders: Secondary | ICD-10-CM

## 2016-05-20 LAB — CBC
HCT: 38.8 % (ref 35.0–45.0)
HEMOGLOBIN: 13.4 g/dL (ref 11.7–15.5)
MCH: 35.4 pg — AB (ref 27.0–33.0)
MCHC: 34.5 g/dL (ref 32.0–36.0)
MCV: 102.6 fL — AB (ref 80.0–100.0)
MPV: 9.7 fL (ref 7.5–12.5)
PLATELETS: 208 10*3/uL (ref 140–400)
RBC: 3.78 MIL/uL — AB (ref 3.80–5.10)
RDW: 15.5 % — ABNORMAL HIGH (ref 11.0–15.0)
WBC: 7.4 10*3/uL (ref 3.8–10.8)

## 2016-05-20 LAB — COMPLETE METABOLIC PANEL WITH GFR
ALT: 86 U/L — ABNORMAL HIGH (ref 6–29)
AST: 181 U/L — ABNORMAL HIGH (ref 10–35)
Albumin: 4 g/dL (ref 3.6–5.1)
Alkaline Phosphatase: 189 U/L — ABNORMAL HIGH (ref 33–115)
BILIRUBIN TOTAL: 0.6 mg/dL (ref 0.2–1.2)
BUN: 4 mg/dL — ABNORMAL LOW (ref 7–25)
CHLORIDE: 100 mmol/L (ref 98–110)
CO2: 21 mmol/L (ref 20–31)
Calcium: 9.4 mg/dL (ref 8.6–10.2)
Creat: 1.01 mg/dL (ref 0.50–1.10)
GFR, EST AFRICAN AMERICAN: 78 mL/min (ref >=60)
GFR, EST NON AFRICAN AMERICAN: 67 mL/min (ref >=60)
GLUCOSE: 108 mg/dL — AB (ref 65–99)
POTASSIUM: 4.3 mmol/L (ref 3.5–5.3)
SODIUM: 135 mmol/L (ref 135–146)
TOTAL PROTEIN: 7.1 g/dL (ref 6.1–8.1)

## 2016-05-20 LAB — TSH: TSH: 5.31 m[IU]/L — AB

## 2016-05-20 LAB — D-DIMER, QUANTITATIVE (NOT AT ARMC): D DIMER QUANT: 0.8 ug{FEU}/mL — AB (ref <0.50)

## 2016-05-21 ENCOUNTER — Telehealth: Payer: Self-pay

## 2016-05-21 NOTE — Telephone Encounter (Signed)
Patient was called to inform her of her doppler appointment for tomorrow at 11:00 am

## 2016-05-22 ENCOUNTER — Telehealth: Payer: Self-pay

## 2016-05-22 ENCOUNTER — Ambulatory Visit (HOSPITAL_COMMUNITY)
Admission: RE | Admit: 2016-05-22 | Discharge: 2016-05-22 | Disposition: A | Discharge status: Home / Self Care | Payer: Self-pay | Source: Ambulatory Visit | Attending: Family Medicine | Admitting: Family Medicine

## 2016-05-22 DIAGNOSIS — M7989 Other specified soft tissue disorders: Secondary | ICD-10-CM | POA: Insufficient documentation

## 2016-05-22 DIAGNOSIS — G5793 Unspecified mononeuropathy of bilateral lower limbs: Secondary | ICD-10-CM

## 2016-05-22 DIAGNOSIS — M79604 Pain in right leg: Secondary | ICD-10-CM | POA: Insufficient documentation

## 2016-05-22 NOTE — Progress Notes (Signed)
VASCULAR LAB PRELIMINARY  PRELIMINARY  PRELIMINARY  PRELIMINARY  Right lower extremity venous duplex has been completed.     Right:  No evidence of DVT, superficial thrombosis, or Baker's cyst.  Called Dr.Enobong Amao @ [facility] dept[facility] With results, spoke with Nechama Guard, RVT, RDMS 05/22/2016, 10:49 AM

## 2016-05-22 NOTE — Telephone Encounter (Signed)
Patients results were given to Jenene Slicker. Patient is still waiting on new prescription for Lyrica for the pass program

## 2016-05-22 NOTE — Telephone Encounter (Signed)
Right lower extremity Doppler was negative. Please advise the patient to keep appointment with her PCP for right leg pain.

## 2016-05-22 NOTE — Telephone Encounter (Signed)
Received cll report from [facility]  Results for Right Venous Doppler: Negative   Advsd ok for pt to leave.  Will forward message to Provider.

## 2016-05-27 NOTE — Telephone Encounter (Signed)
Please forward to PCP

## 2016-05-28 ENCOUNTER — Telehealth: Payer: Self-pay

## 2016-05-28 MED ORDER — PREGABALIN 50 MG PO CAPS: 50.0000 mg | ORAL_CAPSULE | Freq: Three times a day (TID) | ORAL | 3 refills | Status: DC

## 2016-05-28 NOTE — Addendum Note (Signed)
Addended by: Boykin Nearing on: 05/28/2016 08:49 AM   Modules accepted: Orders

## 2016-05-28 NOTE — Telephone Encounter (Signed)
New Rx for lyrica printed and turned into pharmacy Patient should note that lyrica will replace gabapentin once patient acquires it.

## 2016-05-29 NOTE — Telephone Encounter (Signed)
Patient was informed of her prescription being ready

## 2016-05-30 MED FILL — ?LEVOTHYROXINE 50 MCG TABLE: 50 | 30 days supply | Qty: 30 | Fill #0

## 2016-05-30 MED FILL — GABAPENTIN 300 MG CAPSULE: 300 | 30 days supply | Qty: 270 | Fill #0

## 2016-06-18 MED FILL — diazePAM 5 MG TABS: 5 | 30 days supply | Qty: 60 | Fill #1

## 2016-06-30 ENCOUNTER — Telehealth: Payer: Self-pay | Admitting: Family Medicine

## 2016-06-30 DIAGNOSIS — R635 Abnormal weight gain: Secondary | ICD-10-CM

## 2016-06-30 NOTE — Telephone Encounter (Signed)
Pt. Called requesting her test results.  Pt. States that the test were done b/c She has gained to much weight.  Please f/u

## 2016-07-02 NOTE — Telephone Encounter (Signed)
Pt is wanting results on BNP but I see no results all I see is the order for the test please follow up.

## 2016-07-03 NOTE — Telephone Encounter (Signed)
bnp never resulted Please call patient and ask that she return for repeat lab

## 2016-07-09 MED FILL — ?LEVOTHYROXINE 50 MCG TABLE: 50 | 30 days supply | Qty: 30 | Fill #1

## 2016-07-15 ENCOUNTER — Ambulatory Visit: Payer: Self-pay | Admitting: Family Medicine

## 2016-07-21 NOTE — Telephone Encounter (Signed)
Patient hipaa verif; advised per Dr. Adrian Blackwater please return for lab test: BNP. Appt scheduled for Lab on Wednesday 10/4. Priscille Heidelberg, RN, BSN

## 2016-07-22 MED FILL — diazePAM 5 MG TABS: 5 | 30 days supply | Qty: 60 | Fill #2

## 2016-07-23 ENCOUNTER — Ambulatory Visit: Payer: Self-pay | Attending: Family Medicine

## 2016-07-23 DIAGNOSIS — R635 Abnormal weight gain: Secondary | ICD-10-CM | POA: Insufficient documentation

## 2016-07-23 NOTE — Progress Notes (Signed)
Patient here for lab work only 

## 2016-07-24 LAB — BRAIN NATRIURETIC PEPTIDE: BRAIN NATRIURETIC PEPTIDE: 50.2 pg/mL (ref <100)

## 2016-07-31 MED FILL — ?ONDANSETRON HCL 4 MG TABLE: 4 | 30 days supply | Qty: 90 | Fill #1

## 2016-08-12 MED FILL — LEVOTHYROXINE 50 MCG TABLET: 50 | 30 days supply | Qty: 30 | Fill #2

## 2016-09-04 ENCOUNTER — Telehealth: Payer: Self-pay | Admitting: Family Medicine

## 2016-09-04 NOTE — Telephone Encounter (Signed)
Pt. Called requesting ger Lab results. Please f/u

## 2016-09-04 NOTE — Telephone Encounter (Signed)
Pt. Called requesting her lab results for BNP. Pt. Had to come back on 10/4 to have her labs done.  Please f/u

## 2016-09-09 ENCOUNTER — Other Ambulatory Visit: Payer: Self-pay | Admitting: Family Medicine

## 2016-09-09 DIAGNOSIS — E034 Atrophy of thyroid (acquired): Secondary | ICD-10-CM

## 2016-09-09 MED FILL — LEVOTHYROXINE 50 MCG TABLET: 50 | 30 days supply | Qty: 30 | Fill #2

## 2016-09-16 ENCOUNTER — Telehealth: Payer: Self-pay | Admitting: Family Medicine

## 2016-09-16 ENCOUNTER — Ambulatory Visit: Payer: Self-pay | Attending: Family Medicine

## 2016-09-16 DIAGNOSIS — G5793 Unspecified mononeuropathy of bilateral lower limbs: Secondary | ICD-10-CM

## 2016-09-16 NOTE — Telephone Encounter (Signed)
Patient came to the office to request medication refill for diazepam (VALIUM) 5 MG tablet.   Please follow up.   Thank you.

## 2016-09-18 MED ORDER — DIAZEPAM 5 MG PO TABS: 5.0000 mg | ORAL_TABLET | Freq: Two times a day (BID) | ORAL | 2 refills | Status: DC | PRN

## 2016-09-18 NOTE — Telephone Encounter (Signed)
Pt was called and informed of script being ready. 

## 2016-09-18 NOTE — Telephone Encounter (Signed)
Please inform patient valium ready for pick up

## 2016-09-19 MED FILL — diazePAM 5 MG TABS: 5 | 30 days supply | Qty: 60 | Fill #0

## 2016-09-29 ENCOUNTER — Encounter: Payer: Self-pay | Admitting: Family Medicine

## 2016-09-29 ENCOUNTER — Ambulatory Visit: Payer: Self-pay | Attending: Family Medicine | Admitting: Family Medicine

## 2016-09-29 VITALS — BP 132/80 | HR 96 | Temp 98.4°F | Ht 61.0 in | Wt 169.2 lb

## 2016-09-29 DIAGNOSIS — Z01419 Encounter for gynecological examination (general) (routine) without abnormal findings: Secondary | ICD-10-CM | POA: Insufficient documentation

## 2016-09-29 DIAGNOSIS — Z124 Encounter for screening for malignant neoplasm of cervix: Secondary | ICD-10-CM

## 2016-09-29 DIAGNOSIS — R102 Pelvic and perineal pain: Secondary | ICD-10-CM

## 2016-09-29 DIAGNOSIS — Z780 Asymptomatic menopausal state: Secondary | ICD-10-CM

## 2016-09-29 DIAGNOSIS — Z79899 Other long term (current) drug therapy: Secondary | ICD-10-CM | POA: Insufficient documentation

## 2016-09-29 DIAGNOSIS — F17210 Nicotine dependence, cigarettes, uncomplicated: Secondary | ICD-10-CM | POA: Insufficient documentation

## 2016-09-29 DIAGNOSIS — E034 Atrophy of thyroid (acquired): Secondary | ICD-10-CM

## 2016-09-29 NOTE — Progress Notes (Signed)
Pt is here today for a pap smear and referral to OBGYN.

## 2016-09-29 NOTE — Patient Instructions (Addendum)
Jamie Allen was seen today for gynecologic exam.  Diagnoses and all orders for this visit:  Pap smear for cervical cancer screening -     Cytology - PAP  Pelvic pressure in female -     US Pelvis Complete; Future -     US Transvaginal Non-OB; Future  Hypothyroidism due to acquired atrophy of thyroid -     TSH  Postmenopausal -     Ambulatory referral to Gynecology  you will be called with pap and ultrasound results  F/u in  3 months   Dr. Adrian Blackwater

## 2016-09-29 NOTE — Progress Notes (Signed)
Subjective:  Patient ID: Jamie Allen, female    DOB: [birth year]  Age: 45 y.o. MRN: [id number]  CC: Gynecologic Exam   HPI Jamie Allen presents for    1. Pap smear: she is due for screening pap.   2. Postmenopausal: last menstrual period was at least 3 years ago. She is interested in hormone replacement therapy for fatigue and weight gain. Her mother and sister ave   Social History  Substance Use Topics  . Smoking status: Current Some Day Smoker    Packs/day: 0.25    Years: 20.00    Types: Cigarettes  . Smokeless tobacco: Never Used  . Alcohol use 12.6 oz/week    21 Glasses of wine per week    Outpatient Medications Prior to Visit  Medication Sig Dispense Refill  . diazepam (VALIUM) 5 MG tablet Take 1 tablet (5 mg total) by mouth every 12 (twelve) hours as needed for anxiety. 60 tablet 2  . gabapentin (NEURONTIN) 300 MG capsule Take 3 capsules (900 mg total) by mouth 3 (three) times daily. 270 capsule 2  . ibuprofen (ADVIL,MOTRIN) 200 MG tablet Take 800 mg by mouth every 6 (six) hours as needed for moderate pain. Reported on 12/06/2015    . levothyroxine (SYNTHROID, LEVOTHROID) 50 MCG tablet TAKE 1 TABLET BY MOUTH DAILY BEFORE BREAKFAST 30 tablet 2  . Multiple Vitamin (MULTIVITAMIN) tablet Take 1 tablet by mouth daily.    . ondansetron (ZOFRAN) 4 MG tablet Take 4 mg by mouth every 8 (eight) hours as needed for nausea or vomiting.    . potassium chloride (K-DUR) 10 MEQ tablet Take 20 mEq by mouth daily.    . pregabalin (LYRICA) 50 MG capsule Take 1 capsule (50 mg total) by mouth 3 (three) times daily. For PASS 270 capsule 3  . saccharomyces boulardii (FLORASTOR) 250 MG capsule Take 250 mg by mouth 2 (two) times daily.    . Alpha-Lipoic Acid 600 MG CAPS Take 2 capsules by mouth daily.     No facility-administered medications prior to visit.     ROS Review of Systems  Constitutional: Positive for fatigue. Negative for chills and fever.  Eyes: Negative for  visual disturbance.  Respiratory: Negative for shortness of breath.   Cardiovascular: Positive for leg swelling. Negative for chest pain.  Gastrointestinal: Positive for diarrhea. Negative for abdominal pain, blood in stool, nausea and vomiting.  Genitourinary: Positive for difficulty urinating. Negative for dysuria.  Musculoskeletal: Positive for arthralgias, back pain, gait problem and myalgias.  Skin: Negative for rash.  Allergic/Immunologic: Negative for immunocompromised state.  Neurological: Positive for numbness.  Hematological: Negative for adenopathy. Does not bruise/bleed easily.  Psychiatric/Behavioral: Positive for sleep disturbance. Negative for dysphoric mood and suicidal ideas. The patient is nervous/anxious.     Objective:  BP 132/80 (BP Location: Left Arm, Patient Position: Sitting, Cuff Size: Small)   Pulse 96   Temp 98.4 F (36.9 C) (Oral)   Ht 5\' 1"  (1.549 m)   Wt 169 lb 3.2 oz (76.7 kg)   LMP 10/01/2013   SpO2 96%   BMI 31.97 kg/m   BP/Weight 09/29/2016 [id number]  Systolic BP [id number] 99  Diastolic BP 80 75 62  Wt. (Lbs) 169.2 161 137  BMI 31.97 30.42 25.9  Some encounter information is confidential and restricted. Go to Review Flowsheets activity to see all data.    Physical Exam  Constitutional: She is oriented to person, place, and time. She appears well-developed and well-nourished. No distress.  HENT:  Head: Normocephalic and atraumatic.  Cardiovascular: Normal rate, regular rhythm, normal heart sounds and intact distal pulses.   Pulmonary/Chest: Effort normal and breath sounds normal.  Genitourinary: Vagina normal and uterus normal. Pelvic exam was performed with patient prone. There is no rash, tenderness or lesion on the right labia. There is no rash, tenderness or lesion on the left labia. Cervix exhibits no motion tenderness, no discharge and no friability.  Musculoskeletal: She exhibits no edema.  Lymphadenopathy:       Right: No  inguinal adenopathy present.       Left: No inguinal adenopathy present.  Neurological: She is alert and oriented to person, place, and time.  Skin: Skin is warm and dry. No rash noted.  Psychiatric: She has a normal mood and affect.    Assessment & Plan:  Jamie Allen was seen today for gynecologic exam.  Diagnoses and all orders for this visit:  Pap smear for cervical cancer screening -     Cytology - PAP  Pelvic pressure in female -     US Pelvis Complete; Future -     US Transvaginal Non-OB; Future  Hypothyroidism due to acquired atrophy of thyroid -     TSH -     levothyroxine (SYNTHROID, LEVOTHROID) 75 MCG tablet; Take 1 tablet (75 mcg total) by mouth daily before breakfast.  Postmenopausal -     Ambulatory referral to Gynecology   There are no diagnoses linked to this encounter.  No orders of the defined types were placed in this encounter.   Follow-up: Return in about 3 months (around 12/28/2016) for hypothyroidism .   Boykin Nearing MD

## 2016-09-30 LAB — TSH: TSH: 4.48 mIU/L

## 2016-10-01 MED ORDER — LEVOTHYROXINE SODIUM 75 MCG PO TABS: 75.0000 ug | ORAL_TABLET | Freq: Every day | ORAL | 2 refills | Status: DC

## 2016-10-01 NOTE — Assessment & Plan Note (Signed)
Patient complained of  Weight gain and fatigue Compliant with 50 mcg synthroid Lab Results  Component Value Date   TSH 4.48 09/29/2016   Plan: Increase synthroid to 75 mcg daily

## 2016-10-02 LAB — CYTOLOGY - PAP
CHLAMYDIA, DNA PROBE: NEGATIVE
Diagnosis: NEGATIVE
HPV (WINDOPATH): NOT DETECTED
NEISSERIA GONORRHEA: NEGATIVE
TRICH (WINDOWPATH): NEGATIVE

## 2016-10-02 LAB — CERVICOVAGINAL ANCILLARY ONLY: Candida vaginitis: NEGATIVE

## 2016-10-02 MED FILL — LEVOTHYROXINE 75 MCG TABLET: 75 | 30 days supply | Qty: 30 | Fill #0

## 2016-10-07 ENCOUNTER — Ambulatory Visit (HOSPITAL_COMMUNITY): Payer: Self-pay

## 2016-10-22 MED FILL — diazePAM 5 MG TABS: 5 | 30 days supply | Qty: 60 | Fill #1

## 2016-11-24 ENCOUNTER — Ambulatory Visit (INDEPENDENT_AMBULATORY_CARE_PROVIDER_SITE_OTHER): Payer: Self-pay | Admitting: Obstetrics & Gynecology

## 2016-11-24 ENCOUNTER — Encounter: Payer: Self-pay | Admitting: Obstetrics & Gynecology

## 2016-11-24 DIAGNOSIS — N951 Menopausal and female climacteric states: Secondary | ICD-10-CM | POA: Insufficient documentation
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
## 2016-11-25 ENCOUNTER — Telehealth: Payer: Self-pay | Admitting: General Practice

## 2016-11-25 ENCOUNTER — Other Ambulatory Visit: Payer: Self-pay | Admitting: Obstetrics & Gynecology

## 2016-11-25 ENCOUNTER — Telehealth: Payer: Self-pay | Admitting: *Deleted

## 2016-11-25 DIAGNOSIS — N951 Menopausal and female climacteric states: Secondary | ICD-10-CM

## 2016-11-25 LAB — FOLLICLE STIMULATING HORMONE: FSH: 34.8 m[IU]/mL

## 2016-11-25 MED ORDER — MEDROXYPROGESTERONE ACETATE 2.5 MG PO TABS: 2.5000 mg | ORAL_TABLET | Freq: Every day | ORAL | 6 refills | Status: DC

## 2016-11-25 MED ORDER — ESTRADIOL 1 MG PO TABS: 1.0000 mg | ORAL_TABLET | Freq: Every day | ORAL | 6 refills | Status: DC

## 2016-11-25 MED FILL — MEDROXYPROGESTERONE 2.5 MG: 2.5 | 30 days supply | Qty: 30 | Fill #0

## 2016-11-25 MED FILL — ?ESTRADIOL 1MG TABLET: 1 | 30 days supply | Qty: 30 | Fill #0

## 2016-11-25 NOTE — Telephone Encounter (Signed)
Erroneous encounter

## 2016-11-25 NOTE — Telephone Encounter (Signed)
Patient called back into front office & I informed her of results & medications sent to pharmacy. Patient verbalized understanding to all & had no questions

## 2016-11-25 NOTE — Telephone Encounter (Signed)
Per Dr Roselie Awkward, may offer HRT, provera 2.5 mg daily and estrace 1 mg daily, Rx sent to pharmacy. Called patient, no answer- left message to call us back regarding results.

## 2016-11-26 MED FILL — LEVOTHYROXINE 75 MCG TABLET: 75 | 30 days supply | Qty: 30 | Fill #1

## 2016-11-27 LAB — ESTROGENS, TOTAL: ESTROGEN: 143.5 pg/mL

## 2016-12-01 ENCOUNTER — Ambulatory Visit (HOSPITAL_COMMUNITY)
Admission: RE | Admit: 2016-12-01 | Discharge: 2016-12-01 | Disposition: A | Discharge status: Home / Self Care | Payer: Self-pay | Source: Ambulatory Visit | Attending: Family Medicine | Admitting: Family Medicine

## 2016-12-01 ENCOUNTER — Encounter (HOSPITAL_COMMUNITY): Payer: Self-pay

## 2016-12-01 DIAGNOSIS — R635 Abnormal weight gain: Secondary | ICD-10-CM | POA: Insufficient documentation

## 2016-12-01 DIAGNOSIS — R102 Pelvic and perineal pain: Secondary | ICD-10-CM

## 2016-12-01 DIAGNOSIS — N83202 Unspecified ovarian cyst, left side: Secondary | ICD-10-CM | POA: Insufficient documentation

## 2016-12-01 DIAGNOSIS — R1032 Left lower quadrant pain: Secondary | ICD-10-CM | POA: Insufficient documentation

## 2016-12-01 DIAGNOSIS — R1031 Right lower quadrant pain: Secondary | ICD-10-CM | POA: Insufficient documentation

## 2016-12-03 MED FILL — diazePAM 5 MG TABS: 5 | 30 days supply | Qty: 60 | Fill #2

## 2016-12-08 ENCOUNTER — Encounter (HOSPITAL_COMMUNITY): Payer: Self-pay | Admitting: Nurse Practitioner

## 2016-12-08 ENCOUNTER — Emergency Department (HOSPITAL_COMMUNITY)
Admission: EM | Admit: 2016-12-08 | Discharge: 2016-12-08 | Disposition: A | Discharge status: Home / Self Care | Payer: Self-pay | Attending: Emergency Medicine | Admitting: Emergency Medicine

## 2016-12-08 ENCOUNTER — Emergency Department (HOSPITAL_COMMUNITY): Payer: Self-pay

## 2016-12-08 ENCOUNTER — Telehealth: Payer: Self-pay | Admitting: *Deleted

## 2016-12-08 DIAGNOSIS — R109 Unspecified abdominal pain: Secondary | ICD-10-CM

## 2016-12-08 DIAGNOSIS — Z79899 Other long term (current) drug therapy: Secondary | ICD-10-CM | POA: Insufficient documentation

## 2016-12-08 DIAGNOSIS — M7989 Other specified soft tissue disorders: Secondary | ICD-10-CM | POA: Insufficient documentation

## 2016-12-08 DIAGNOSIS — F17210 Nicotine dependence, cigarettes, uncomplicated: Secondary | ICD-10-CM | POA: Insufficient documentation

## 2016-12-08 DIAGNOSIS — R1031 Right lower quadrant pain: Secondary | ICD-10-CM | POA: Insufficient documentation

## 2016-12-08 DIAGNOSIS — R1011 Right upper quadrant pain: Secondary | ICD-10-CM | POA: Insufficient documentation

## 2016-12-08 DIAGNOSIS — E039 Hypothyroidism, unspecified: Secondary | ICD-10-CM | POA: Insufficient documentation

## 2016-12-08 DIAGNOSIS — D649 Anemia, unspecified: Secondary | ICD-10-CM | POA: Insufficient documentation

## 2016-12-08 DIAGNOSIS — I10 Essential (primary) hypertension: Secondary | ICD-10-CM | POA: Insufficient documentation

## 2016-12-08 LAB — COMPREHENSIVE METABOLIC PANEL
ALT: 21 U/L (ref 14–54)
AST: 128 U/L — ABNORMAL HIGH (ref 15–41)
Albumin: 2.9 g/dL — ABNORMAL LOW (ref 3.5–5.0)
Alkaline Phosphatase: 158 U/L — ABNORMAL HIGH (ref 38–126)
Anion gap: 14 (ref 5–15)
CHLORIDE: 100 mmol/L — AB (ref 101–111)
CO2: 21 mmol/L — AB (ref 22–32)
CREATININE: 0.75 mg/dL (ref 0.44–1.00)
Calcium: 8.8 mg/dL — ABNORMAL LOW (ref 8.9–10.3)
GFR calc Af Amer: >60 mL/min (ref >60)
GFR calc non Af Amer: >60 mL/min (ref >60)
Glucose, Bld: 103 mg/dL — ABNORMAL HIGH (ref 65–99)
Potassium: 3.2 mmol/L — ABNORMAL LOW (ref 3.5–5.1)
Sodium: 135 mmol/L (ref 135–145)
Total Bilirubin: 2.1 mg/dL — ABNORMAL HIGH (ref 0.3–1.2)
Total Protein: 7.2 g/dL (ref 6.5–8.1)

## 2016-12-08 LAB — CBC
HCT: 32.2 % — ABNORMAL LOW (ref 36.0–46.0)
Hemoglobin: 10.9 g/dL — ABNORMAL LOW (ref 12.0–15.0)
MCH: 36.9 pg — AB (ref 26.0–34.0)
MCHC: 33.9 g/dL (ref 30.0–36.0)
MCV: 109.2 fL — AB (ref 78.0–100.0)
Platelets: 132 10*3/uL — ABNORMAL LOW (ref 150–400)
RBC: 2.95 MIL/uL — AB (ref 3.87–5.11)
RDW: 16 % — AB (ref 11.5–15.5)
WBC: 7.9 10*3/uL (ref 4.0–10.5)

## 2016-12-08 LAB — I-STAT BETA HCG BLOOD, ED (MC, WL, AP ONLY): I-stat hCG, quantitative: <5 m[IU]/mL (ref <5)

## 2016-12-08 LAB — URINALYSIS, ROUTINE W REFLEX MICROSCOPIC
Bilirubin Urine: NEGATIVE
GLUCOSE, UA: NEGATIVE mg/dL
HGB URINE DIPSTICK: NEGATIVE
Ketones, ur: NEGATIVE mg/dL
Leukocytes, UA: NEGATIVE
Nitrite: NEGATIVE
PH: 7 (ref 5.0–8.0)
Protein, ur: NEGATIVE mg/dL
Specific Gravity, Urine: 1.005 (ref 1.005–1.030)

## 2016-12-08 LAB — POC OCCULT BLOOD, ED: Fecal Occult Bld: NEGATIVE

## 2016-12-08 LAB — LIPASE, BLOOD: Lipase: 24 U/L (ref 11–51)

## 2016-12-08 MED ORDER — SODIUM CHLORIDE 0.9 % IV BOLUS (SEPSIS)
1000.0000 mL | Freq: Once | INTRAVENOUS | Status: AC
  Administered 2016-12-08: 1000 mL via INTRAVENOUS

## 2016-12-08 MED ORDER — SODIUM CHLORIDE 0.9 % IV SOLN
INTRAVENOUS | Status: DC
  Administered 2016-12-08: 21:00:00 via INTRAVENOUS

## 2016-12-08 MED ORDER — TRAMADOL HCL 50 MG PO TABS: 50.0000 mg | ORAL_TABLET | Freq: Four times a day (QID) | ORAL | 0 refills | Status: DC | PRN

## 2016-12-08 MED ORDER — NAPROXEN 500 MG PO TABS: 500.0000 mg | ORAL_TABLET | Freq: Two times a day (BID) | ORAL | 0 refills | Status: DC

## 2016-12-08 MED ORDER — HYDROMORPHONE HCL 2 MG/ML IJ SOLN
0.5000 mg | INTRAMUSCULAR | Status: DC | PRN
  Administered 2016-12-08 (×2): 0.5 mg via INTRAVENOUS
  Filled 2016-12-08 (×2): qty 1

## 2016-12-08 MED ORDER — ONDANSETRON HCL 4 MG/2ML IJ SOLN
4.0000 mg | Freq: Once | INTRAMUSCULAR | Status: AC
  Administered 2016-12-08: 4 mg via INTRAVENOUS
  Filled 2016-12-08: qty 2

## 2016-12-08 MED ORDER — IOPAMIDOL (ISOVUE-300) INJECTION 61%
INTRAVENOUS | Status: AC
  Filled 2016-12-08: qty 100

## 2016-12-08 NOTE — ED Notes (Addendum)
MD notified of pt's blood pressure.   Manual pressure 88/42.

## 2016-12-08 NOTE — Telephone Encounter (Signed)
Patient left a message on the nurse voicemail on 12/05/16 at 1409.  States she had a pelvic ultrasound about a week ago.  Would like to get results.  Requests a return call to [phone].

## 2016-12-08 NOTE — ED Triage Notes (Addendum)
Pt presents with c/o abd pain. The pain began about 2 weeks ago, and has been getting worse since onset. She reports loss of appetite, nausea, vomiting, diarrhea, bloating.. Her emesis and stools have been yellow and she saw some blood in her vomit one time. She took gads-x with no relief. She has a history of liver problems and is concerned this may be related

## 2016-12-08 NOTE — Discharge Instructions (Signed)
Follow-up with your primary care doctor make sure he abdominal pain is improving, take the medications as needed for pain
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
## 2016-12-08 NOTE — ED Notes (Addendum)
Pt ambulated to BR with slow even gait to provide urine sample.

## 2016-12-08 NOTE — ED Notes (Signed)
PT did not have to use RR at this time  

## 2016-12-09 NOTE — ED Notes (Signed)
Wasted 1mg  of Hydromorphone in sink with Callie Fielding on 12/09/2016 @ 2300.

## 2016-12-09 NOTE — ED Notes (Signed)
Wasted 1MG  OF hydromorphone with Tilda Burrow on 12/09/16 @ 23:00

## 2016-12-10 ENCOUNTER — Telehealth: Payer: Self-pay | Admitting: Family Medicine

## 2016-12-10 ENCOUNTER — Telehealth: Payer: Self-pay | Admitting: *Deleted

## 2016-12-10 NOTE — Telephone Encounter (Signed)
See other call dated 12/08/16

## 2016-12-10 NOTE — Telephone Encounter (Signed)
Patient called the office to speak with nurse regarding her results for the test that she had done at [facility]. Please follow up.  Thank you.

## 2016-12-10 NOTE — Telephone Encounter (Signed)
Patient called again and left another message she is requesting results of [name].

## 2016-12-10 NOTE — Telephone Encounter (Signed)
Pt was called and informed of Ultrasound results.

## 2016-12-10 NOTE — Telephone Encounter (Signed)
I called Jamie Allen and explained to her results of her ultrasound and that we would send a message to Dr. Roselie Awkward- and if there were any other tests or information for her - or needs further appointments. we would call her back. She states she is still having the same pain in her ovary . She voices understanding.

## 2016-12-10 NOTE — Telephone Encounter (Signed)
Patient called nurse line and left message she was seen 3 weeks ago and has left a message and hasn't heard back.

## 2016-12-14 ENCOUNTER — Emergency Department (HOSPITAL_COMMUNITY)
Admission: EM | Admit: 2016-12-14 | Discharge: 2016-12-14 | Disposition: A | Discharge status: Home / Self Care | Payer: Self-pay | Attending: Emergency Medicine | Admitting: Emergency Medicine

## 2016-12-14 ENCOUNTER — Emergency Department (HOSPITAL_COMMUNITY): Payer: Self-pay

## 2016-12-14 ENCOUNTER — Encounter (HOSPITAL_COMMUNITY): Payer: Self-pay | Admitting: Emergency Medicine

## 2016-12-14 DIAGNOSIS — K839 Disease of biliary tract, unspecified: Secondary | ICD-10-CM | POA: Insufficient documentation

## 2016-12-14 DIAGNOSIS — R160 Hepatomegaly, not elsewhere classified: Secondary | ICD-10-CM

## 2016-12-14 DIAGNOSIS — F17210 Nicotine dependence, cigarettes, uncomplicated: Secondary | ICD-10-CM | POA: Insufficient documentation

## 2016-12-14 DIAGNOSIS — I10 Essential (primary) hypertension: Secondary | ICD-10-CM | POA: Insufficient documentation

## 2016-12-14 DIAGNOSIS — K838 Other specified diseases of biliary tract: Secondary | ICD-10-CM

## 2016-12-14 DIAGNOSIS — E039 Hypothyroidism, unspecified: Secondary | ICD-10-CM | POA: Insufficient documentation

## 2016-12-14 DIAGNOSIS — K5900 Constipation, unspecified: Secondary | ICD-10-CM

## 2016-12-14 DIAGNOSIS — R109 Unspecified abdominal pain: Secondary | ICD-10-CM

## 2016-12-14 LAB — CBC WITH DIFFERENTIAL/PLATELET
BASOS PCT: 0 %
Basophils Absolute: 0 10*3/uL (ref 0.0–0.1)
EOS PCT: 1 %
Eosinophils Absolute: 0.1 10*3/uL (ref 0.0–0.7)
HCT: 29.7 % — ABNORMAL LOW (ref 36.0–46.0)
Hemoglobin: 10.1 g/dL — ABNORMAL LOW (ref 12.0–15.0)
Lymphocytes Relative: 14 %
Lymphs Abs: 0.8 10*3/uL (ref 0.7–4.0)
MCH: 37.3 pg — ABNORMAL HIGH (ref 26.0–34.0)
MCHC: 34 g/dL (ref 30.0–36.0)
MCV: 109.6 fL — ABNORMAL HIGH (ref 78.0–100.0)
Monocytes Absolute: 0.3 10*3/uL (ref 0.1–1.0)
Monocytes Relative: 4 %
Neutro Abs: 4.6 10*3/uL (ref 1.7–7.7)
Neutrophils Relative %: 81 %
PLATELETS: 112 10*3/uL — AB (ref 150–400)
RBC: 2.71 MIL/uL — ABNORMAL LOW (ref 3.87–5.11)
RDW: 15.6 % — ABNORMAL HIGH (ref 11.5–15.5)
WBC: 5.7 10*3/uL (ref 4.0–10.5)

## 2016-12-14 LAB — URINALYSIS, ROUTINE W REFLEX MICROSCOPIC
Bilirubin Urine: NEGATIVE
GLUCOSE, UA: NEGATIVE mg/dL
HGB URINE DIPSTICK: NEGATIVE
KETONES UR: NEGATIVE mg/dL
LEUKOCYTES UA: NEGATIVE
Nitrite: NEGATIVE
PROTEIN: NEGATIVE mg/dL
Specific Gravity, Urine: 1.01 (ref 1.005–1.030)
pH: 7 (ref 5.0–8.0)

## 2016-12-14 LAB — COMPREHENSIVE METABOLIC PANEL
ALK PHOS: 141 U/L — AB (ref 38–126)
ALT: 19 U/L (ref 14–54)
ANION GAP: 11 (ref 5–15)
AST: 73 U/L — ABNORMAL HIGH (ref 15–41)
Albumin: 2.8 g/dL — ABNORMAL LOW (ref 3.5–5.0)
BILIRUBIN TOTAL: 2.1 mg/dL — AB (ref 0.3–1.2)
BUN: <5 mg/dL — ABNORMAL LOW (ref 6–20)
CALCIUM: 9.1 mg/dL (ref 8.9–10.3)
CO2: 22 mmol/L (ref 22–32)
Chloride: 102 mmol/L (ref 101–111)
Creatinine, Ser: 0.72 mg/dL (ref 0.44–1.00)
GFR calc non Af Amer: >60 mL/min (ref >60)
Glucose, Bld: 114 mg/dL — ABNORMAL HIGH (ref 65–99)
Potassium: 3 mmol/L — ABNORMAL LOW (ref 3.5–5.1)
SODIUM: 135 mmol/L (ref 135–145)
TOTAL PROTEIN: 7.3 g/dL (ref 6.5–8.1)

## 2016-12-14 LAB — I-STAT CHEM 8, ED
BUN: 3 mg/dL — ABNORMAL LOW (ref 6–20)
CALCIUM ION: 1.11 mmol/L — AB (ref 1.15–1.40)
Chloride: 103 mmol/L (ref 101–111)
Creatinine, Ser: 0.6 mg/dL (ref 0.44–1.00)
GLUCOSE: 113 mg/dL — AB (ref 65–99)
HCT: 30 % — ABNORMAL LOW (ref 36.0–46.0)
HEMOGLOBIN: 10.2 g/dL — AB (ref 12.0–15.0)
Potassium: 3.1 mmol/L — ABNORMAL LOW (ref 3.5–5.1)
Sodium: 138 mmol/L (ref 135–145)
TCO2: 23 mmol/L (ref 0–100)

## 2016-12-14 LAB — LIPASE, BLOOD: Lipase: 17 U/L (ref 11–51)

## 2016-12-14 LAB — I-STAT TROPONIN, ED: TROPONIN I, POC: 0 ng/mL (ref 0.00–0.08)

## 2016-12-14 LAB — I-STAT CG4 LACTIC ACID, ED: LACTIC ACID, VENOUS: 1.82 mmol/L (ref 0.5–1.9)

## 2016-12-14 MED ORDER — DIPHENHYDRAMINE HCL 50 MG/ML IJ SOLN
50.0000 mg | Freq: Once | INTRAMUSCULAR | Status: AC
  Administered 2016-12-14: 50 mg via INTRAVENOUS
  Filled 2016-12-14 (×2): qty 1

## 2016-12-14 MED ORDER — HYDROCORTISONE NA SUCCINATE PF 100 MG IJ SOLR
200.0000 mg | Freq: Once | INTRAMUSCULAR | Status: AC
  Administered 2016-12-14: 200 mg via INTRAVENOUS
  Filled 2016-12-14: qty 4

## 2016-12-14 MED ORDER — ONDANSETRON HCL 4 MG/2ML IJ SOLN
4.0000 mg | INTRAMUSCULAR | Status: DC | PRN
  Filled 2016-12-14: qty 2

## 2016-12-14 MED ORDER — POTASSIUM CHLORIDE ER 20 MEQ PO TBCR: 20.0000 meq | EXTENDED_RELEASE_TABLET | Freq: Every day | ORAL | 0 refills | Status: DC

## 2016-12-14 MED ORDER — SODIUM CHLORIDE 0.9 % IV SOLN
Freq: Once | INTRAVENOUS | Status: AC
  Administered 2016-12-14: 12:00:00 via INTRAVENOUS
  Filled 2016-12-14: qty 1000

## 2016-12-14 MED ORDER — HYDROMORPHONE HCL 2 MG/ML IJ SOLN
0.5000 mg | Freq: Once | INTRAMUSCULAR | Status: AC
  Administered 2016-12-14: 0.5 mg via INTRAVENOUS
  Filled 2016-12-14: qty 1

## 2016-12-14 MED ORDER — IOPAMIDOL (ISOVUE-370) INJECTION 76%
INTRAVENOUS | Status: AC
  Administered 2016-12-14: 80 mL
  Filled 2016-12-14: qty 100

## 2016-12-14 MED ORDER — PROMETHAZINE HCL 25 MG PO TABS: 25.0000 mg | ORAL_TABLET | Freq: Three times a day (TID) | ORAL | 0 refills | Status: DC | PRN

## 2016-12-14 MED ORDER — SIMETHICONE 40 MG/0.6ML PO SUSP (UNIT DOSE)
80.0000 mg | Freq: Once | ORAL | Status: AC
  Administered 2016-12-14: 80 mg via ORAL
  Filled 2016-12-14: qty 1.2

## 2016-12-14 MED ORDER — SODIUM CHLORIDE 0.9 % IV BOLUS (SEPSIS)
1000.0000 mL | Freq: Once | INTRAVENOUS | Status: AC
  Administered 2016-12-14: 1000 mL via INTRAVENOUS

## 2016-12-14 MED ORDER — POLYETHYLENE GLYCOL 3350 17 GM/SCOOP PO POWD: ORAL | 0 refills | Status: DC

## 2016-12-14 MED ORDER — HYDROMORPHONE HCL 2 MG/ML IJ SOLN
1.0000 mg | Freq: Once | INTRAMUSCULAR | Status: AC
  Administered 2016-12-14: 1 mg via INTRAVENOUS
  Filled 2016-12-14: qty 1

## 2016-12-14 NOTE — ED Notes (Signed)
Patient placed on bedside commode.

## 2016-12-14 NOTE — ED Notes (Signed)
Taken to u/s.

## 2016-12-14 NOTE — ED Notes (Signed)
IV in RAC came out in xray.

## 2016-12-14 NOTE — ED Notes (Signed)
Patient place on bedside command.

## 2016-12-14 NOTE — ED Notes (Signed)
nauseated

## 2016-12-14 NOTE — ED Notes (Signed)
MD at bedside. 

## 2016-12-14 NOTE — ED Notes (Signed)
MD aware of patient request for additional pain medication.

## 2016-12-14 NOTE — ED Triage Notes (Signed)
Pt here for abd pain and N/V x 2 weeks; pt sts issues with liver and ovarian cyst as well
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
## 2016-12-14 NOTE — Discharge Instructions (Signed)
It is nice taking care of you! We think your abdominal pain is due to constipation. We gave you prescription for MiraLAX for bowel clean out at home. Follow the instruction on the prescription on how to use the MiraLax. We also gave you prescription for nausea and vomiting. Please follow up with your primary care doctor.  Seek immediate care if you have worsening of pain, persistent vomiting, fever or other symptoms concerning to you.

## 2016-12-14 NOTE — ED Provider Notes (Signed)
Patient's care signed out to follow-up ultrasound. Patient's workup was overall unremarkable except for nonspecific gallbladder findings on CT scan. Formal ultrasound did not show signs of cholecystitis. Patient needs outpatient follow-up for hepatomegaly.  Elnora Morrison Mariea Clonts, MD 12/14/16 1754

## 2016-12-14 NOTE — ED Notes (Signed)
Spoke with the resident advised to hold benadryl until scan.

## 2016-12-14 NOTE — ED Notes (Signed)
Pt returned from [name] c/o nausea

## 2016-12-20 ENCOUNTER — Emergency Department (HOSPITAL_COMMUNITY): Payer: Self-pay

## 2016-12-20 ENCOUNTER — Emergency Department (HOSPITAL_COMMUNITY)
Admission: EM | Admit: 2016-12-20 | Discharge: 2016-12-20 | Disposition: A | Discharge status: Home / Self Care | Payer: Self-pay | Attending: Emergency Medicine | Admitting: Emergency Medicine

## 2016-12-20 ENCOUNTER — Encounter (HOSPITAL_COMMUNITY): Payer: Self-pay | Admitting: Emergency Medicine

## 2016-12-20 DIAGNOSIS — Z79899 Other long term (current) drug therapy: Secondary | ICD-10-CM | POA: Insufficient documentation

## 2016-12-20 DIAGNOSIS — F17210 Nicotine dependence, cigarettes, uncomplicated: Secondary | ICD-10-CM | POA: Insufficient documentation

## 2016-12-20 DIAGNOSIS — R109 Unspecified abdominal pain: Secondary | ICD-10-CM

## 2016-12-20 DIAGNOSIS — E039 Hypothyroidism, unspecified: Secondary | ICD-10-CM | POA: Insufficient documentation

## 2016-12-20 DIAGNOSIS — R1084 Generalized abdominal pain: Secondary | ICD-10-CM | POA: Insufficient documentation

## 2016-12-20 DIAGNOSIS — R112 Nausea with vomiting, unspecified: Secondary | ICD-10-CM | POA: Insufficient documentation

## 2016-12-20 DIAGNOSIS — I10 Essential (primary) hypertension: Secondary | ICD-10-CM | POA: Insufficient documentation

## 2016-12-20 DIAGNOSIS — R197 Diarrhea, unspecified: Secondary | ICD-10-CM | POA: Insufficient documentation

## 2016-12-20 LAB — URINALYSIS, ROUTINE W REFLEX MICROSCOPIC
BILIRUBIN URINE: NEGATIVE
Glucose, UA: NEGATIVE mg/dL
Ketones, ur: 20 mg/dL — AB
Leukocytes, UA: NEGATIVE
Nitrite: NEGATIVE
Protein, ur: 100 mg/dL — AB
SPECIFIC GRAVITY, URINE: 1.013 (ref 1.005–1.030)
pH: 6 (ref 5.0–8.0)

## 2016-12-20 LAB — CBC
HCT: 29.3 % — ABNORMAL LOW (ref 36.0–46.0)
Hemoglobin: 10 g/dL — ABNORMAL LOW (ref 12.0–15.0)
MCH: 36.4 pg — AB (ref 26.0–34.0)
MCHC: 34.1 g/dL (ref 30.0–36.0)
MCV: 106.5 fL — ABNORMAL HIGH (ref 78.0–100.0)
PLATELETS: 122 10*3/uL — AB (ref 150–400)
RBC: 2.75 MIL/uL — ABNORMAL LOW (ref 3.87–5.11)
RDW: 15.4 % (ref 11.5–15.5)
WBC: 7 10*3/uL (ref 4.0–10.5)

## 2016-12-20 LAB — COMPREHENSIVE METABOLIC PANEL
ALK PHOS: 140 U/L — AB (ref 38–126)
ALT: 16 U/L (ref 14–54)
AST: 69 U/L — ABNORMAL HIGH (ref 15–41)
Albumin: 3.2 g/dL — ABNORMAL LOW (ref 3.5–5.0)
Anion gap: 12 (ref 5–15)
BILIRUBIN TOTAL: 2.3 mg/dL — AB (ref 0.3–1.2)
BUN: 9 mg/dL (ref 6–20)
CALCIUM: 8.9 mg/dL (ref 8.9–10.3)
CO2: 19 mmol/L — AB (ref 22–32)
CREATININE: 0.6 mg/dL (ref 0.44–1.00)
Chloride: 103 mmol/L (ref 101–111)
GFR calc Af Amer: >60 mL/min (ref >60)
GFR calc non Af Amer: >60 mL/min (ref >60)
Glucose, Bld: 99 mg/dL (ref 65–99)
Potassium: 3.4 mmol/L — ABNORMAL LOW (ref 3.5–5.1)
SODIUM: 134 mmol/L — AB (ref 135–145)
Total Protein: 7.6 g/dL (ref 6.5–8.1)

## 2016-12-20 LAB — LIPASE, BLOOD: Lipase: 45 U/L (ref 11–51)

## 2016-12-20 MED ORDER — LORAZEPAM 2 MG/ML IJ SOLN
1.0000 mg | Freq: Once | INTRAMUSCULAR | Status: AC
  Administered 2016-12-20: 1 mg via INTRAVENOUS
  Filled 2016-12-20: qty 1

## 2016-12-20 MED ORDER — SODIUM CHLORIDE 0.9 % IV BOLUS (SEPSIS)
1000.0000 mL | Freq: Once | INTRAVENOUS | Status: AC
  Administered 2016-12-20: 1000 mL via INTRAVENOUS

## 2016-12-20 MED ORDER — HYDROCODONE-ACETAMINOPHEN 5-325 MG PO TABS: 1.0000 | ORAL_TABLET | ORAL | 0 refills | Status: DC | PRN

## 2016-12-20 MED ORDER — PROMETHAZINE HCL 25 MG/ML IJ SOLN
25.0000 mg | Freq: Once | INTRAMUSCULAR | Status: AC
  Administered 2016-12-20: 25 mg via INTRAVENOUS
  Filled 2016-12-20: qty 1

## 2016-12-20 MED ORDER — HYDROMORPHONE HCL 1 MG/ML IJ SOLN
1.0000 mg | Freq: Once | INTRAMUSCULAR | Status: AC
  Administered 2016-12-20: 1 mg via INTRAVENOUS
  Filled 2016-12-20: qty 1

## 2016-12-20 MED ORDER — PROMETHAZINE HCL 25 MG PO TABS: 25.0000 mg | ORAL_TABLET | Freq: Three times a day (TID) | ORAL | 0 refills | Status: DC | PRN

## 2016-12-20 NOTE — ED Notes (Signed)
Accidentally clicked off urine by mistake

## 2016-12-20 NOTE — ED Notes (Signed)
Bed: WA14 Expected date:  Expected time:  Means of arrival:  Comments: Hold for triage 1 
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
## 2016-12-20 NOTE — ED Triage Notes (Signed)
Per pt, states she was diagnosed with fibroids and ovarian cysts-states she was seen at [facility] for same symptoms on the 25 th of February-states increased pain and started bleeding vaginally 3 days ago-has seen GYN and was told to see PCP

## 2016-12-20 NOTE — Discharge Instructions (Signed)
Stay hydrated.   Take phenergan as needed for nausea and vomiting.   Take tylenol for pain. Take hydrocodone for severe pain.   See GI and surgery and your primary care doctor  Return to ER if you have worse abdominal pain, vomiting, fevers, worse diarrhea.

## 2016-12-20 NOTE — ED Notes (Signed)
Our ultrasonographer is here and beginning her abdominal study.

## 2016-12-24 ENCOUNTER — Inpatient Hospital Stay (HOSPITAL_COMMUNITY)
Admission: EM | Admit: 2016-12-24 | Discharge: 2016-12-30 | DRG: 418 | Disposition: A | Discharge status: Home / Self Care | Payer: Self-pay | Attending: Emergency Medicine | Admitting: Emergency Medicine

## 2016-12-24 ENCOUNTER — Encounter (HOSPITAL_COMMUNITY): Payer: Self-pay

## 2016-12-24 ENCOUNTER — Observation Stay (HOSPITAL_COMMUNITY): Payer: Self-pay

## 2016-12-24 DIAGNOSIS — K8020 Calculus of gallbladder without cholecystitis without obstruction: Secondary | ICD-10-CM

## 2016-12-24 DIAGNOSIS — Z9889 Other specified postprocedural states: Secondary | ICD-10-CM

## 2016-12-24 DIAGNOSIS — E669 Obesity, unspecified: Secondary | ICD-10-CM | POA: Diagnosis present

## 2016-12-24 DIAGNOSIS — E039 Hypothyroidism, unspecified: Secondary | ICD-10-CM | POA: Diagnosis present

## 2016-12-24 DIAGNOSIS — Z560 Unemployment, unspecified: Secondary | ICD-10-CM

## 2016-12-24 DIAGNOSIS — K567 Ileus, unspecified: Secondary | ICD-10-CM | POA: Diagnosis present

## 2016-12-24 DIAGNOSIS — R945 Abnormal results of liver function studies: Secondary | ICD-10-CM | POA: Diagnosis present

## 2016-12-24 DIAGNOSIS — R1011 Right upper quadrant pain: Secondary | ICD-10-CM

## 2016-12-24 DIAGNOSIS — R162 Hepatomegaly with splenomegaly, not elsewhere classified: Secondary | ICD-10-CM | POA: Diagnosis present

## 2016-12-24 DIAGNOSIS — R748 Abnormal levels of other serum enzymes: Secondary | ICD-10-CM

## 2016-12-24 DIAGNOSIS — Z6827 Body mass index (BMI) 27.0-27.9, adult: Secondary | ICD-10-CM

## 2016-12-24 DIAGNOSIS — G629 Polyneuropathy, unspecified: Secondary | ICD-10-CM | POA: Diagnosis present

## 2016-12-24 DIAGNOSIS — Z885 Allergy status to narcotic agent status: Secondary | ICD-10-CM

## 2016-12-24 DIAGNOSIS — K5900 Constipation, unspecified: Secondary | ICD-10-CM | POA: Diagnosis present

## 2016-12-24 DIAGNOSIS — F17210 Nicotine dependence, cigarettes, uncomplicated: Secondary | ICD-10-CM | POA: Diagnosis present

## 2016-12-24 DIAGNOSIS — Z888 Allergy status to other drugs, medicaments and biological substances status: Secondary | ICD-10-CM

## 2016-12-24 DIAGNOSIS — R300 Dysuria: Secondary | ICD-10-CM | POA: Diagnosis present

## 2016-12-24 DIAGNOSIS — D539 Nutritional anemia, unspecified: Secondary | ICD-10-CM | POA: Diagnosis present

## 2016-12-24 DIAGNOSIS — Z79899 Other long term (current) drug therapy: Secondary | ICD-10-CM

## 2016-12-24 DIAGNOSIS — K8010 Calculus of gallbladder with chronic cholecystitis without obstruction: Principal | ICD-10-CM | POA: Diagnosis present

## 2016-12-24 DIAGNOSIS — Z9049 Acquired absence of other specified parts of digestive tract: Secondary | ICD-10-CM

## 2016-12-24 DIAGNOSIS — Z419 Encounter for procedure for purposes other than remedying health state, unspecified: Secondary | ICD-10-CM

## 2016-12-24 DIAGNOSIS — I10 Essential (primary) hypertension: Secondary | ICD-10-CM | POA: Diagnosis present

## 2016-12-24 DIAGNOSIS — E876 Hypokalemia: Secondary | ICD-10-CM | POA: Diagnosis present

## 2016-12-24 DIAGNOSIS — D259 Leiomyoma of uterus, unspecified: Secondary | ICD-10-CM | POA: Diagnosis present

## 2016-12-24 DIAGNOSIS — R7989 Other specified abnormal findings of blood chemistry: Secondary | ICD-10-CM

## 2016-12-24 DIAGNOSIS — K219 Gastro-esophageal reflux disease without esophagitis: Secondary | ICD-10-CM | POA: Diagnosis present

## 2016-12-24 DIAGNOSIS — B9620 Unspecified Escherichia coli [E. coli] as the cause of diseases classified elsewhere: Secondary | ICD-10-CM | POA: Diagnosis present

## 2016-12-24 HISTORY — DX: Calculus of gallbladder with chronic cholecystitis without obstruction: K80.10

## 2016-12-24 LAB — CREATININE, SERUM
CREATININE: 0.87 mg/dL (ref 0.44–1.00)
GFR calc Af Amer: >60 mL/min (ref >60)
GFR calc non Af Amer: >60 mL/min (ref >60)

## 2016-12-24 LAB — URINALYSIS, ROUTINE W REFLEX MICROSCOPIC
BILIRUBIN URINE: NEGATIVE
GLUCOSE, UA: NEGATIVE mg/dL
KETONES UR: 20 mg/dL — AB
NITRITE: NEGATIVE
PH: 5 (ref 5.0–8.0)
Protein, ur: 100 mg/dL — AB
Specific Gravity, Urine: 1.009 (ref 1.005–1.030)

## 2016-12-24 LAB — COMPREHENSIVE METABOLIC PANEL
ALBUMIN: 3.2 g/dL — AB (ref 3.5–5.0)
ALT: 15 U/L (ref 14–54)
ANION GAP: 12 (ref 5–15)
AST: 55 U/L — ABNORMAL HIGH (ref 15–41)
Alkaline Phosphatase: 147 U/L — ABNORMAL HIGH (ref 38–126)
BILIRUBIN TOTAL: 2.5 mg/dL — AB (ref 0.3–1.2)
BUN: 11 mg/dL (ref 6–20)
CO2: 18 mmol/L — ABNORMAL LOW (ref 22–32)
Calcium: 9 mg/dL (ref 8.9–10.3)
Chloride: 101 mmol/L (ref 101–111)
Creatinine, Ser: 0.85 mg/dL (ref 0.44–1.00)
GFR calc Af Amer: >60 mL/min (ref >60)
GFR calc non Af Amer: >60 mL/min (ref >60)
GLUCOSE: 110 mg/dL — AB (ref 65–99)
POTASSIUM: 3.2 mmol/L — AB (ref 3.5–5.1)
SODIUM: 131 mmol/L — AB (ref 135–145)
TOTAL PROTEIN: 7.7 g/dL (ref 6.5–8.1)

## 2016-12-24 LAB — IRON AND TIBC
IRON: 28 ug/dL (ref 28–170)
Saturation Ratios: 13 % (ref 10.4–31.8)
TIBC: 224 ug/dL — ABNORMAL LOW (ref 250–450)
UIBC: 196 ug/dL

## 2016-12-24 LAB — CBC
HCT: 27.4 % — ABNORMAL LOW (ref 36.0–46.0)
HEMATOCRIT: 26.9 % — AB (ref 36.0–46.0)
HEMOGLOBIN: 9.3 g/dL — AB (ref 12.0–15.0)
Hemoglobin: 9.4 g/dL — ABNORMAL LOW (ref 12.0–15.0)
MCH: 36.5 pg — ABNORMAL HIGH (ref 26.0–34.0)
MCH: 36.2 pg — AB (ref 26.0–34.0)
MCHC: 34.6 g/dL (ref 30.0–36.0)
MCHC: 34.3 g/dL (ref 30.0–36.0)
MCV: 105.5 fL — ABNORMAL HIGH (ref 78.0–100.0)
MCV: 105.4 fL — ABNORMAL HIGH (ref 78.0–100.0)
PLATELETS: 139 10*3/uL — AB (ref 150–400)
Platelets: 129 10*3/uL — ABNORMAL LOW (ref 150–400)
RBC: 2.55 MIL/uL — ABNORMAL LOW (ref 3.87–5.11)
RBC: 2.6 MIL/uL — ABNORMAL LOW (ref 3.87–5.11)
RDW: 15.2 % (ref 11.5–15.5)
RDW: 15 % (ref 11.5–15.5)
WBC: 11.1 10*3/uL — ABNORMAL HIGH (ref 4.0–10.5)
WBC: 10 10*3/uL (ref 4.0–10.5)

## 2016-12-24 LAB — RETICULOCYTES
RBC.: 2.57 MIL/uL — AB (ref 3.87–5.11)
Retic Count, Absolute: 100.2 10*3/uL (ref 19.0–186.0)
Retic Ct Pct: 3.9 % — ABNORMAL HIGH (ref 0.4–3.1)

## 2016-12-24 LAB — FERRITIN: Ferritin: 347 ng/mL — ABNORMAL HIGH (ref 11–307)

## 2016-12-24 LAB — LIPASE, BLOOD: Lipase: 32 U/L (ref 11–51)

## 2016-12-24 LAB — VITAMIN B12: Vitamin B-12: 904 pg/mL (ref 180–914)

## 2016-12-24 LAB — FOLATE: Folate: 3.6 ng/mL — ABNORMAL LOW

## 2016-12-24 MED ORDER — PREGABALIN 50 MG PO CAPS
50.0000 mg | ORAL_CAPSULE | Freq: Every day | ORAL | Status: DC
  Administered 2016-12-26 – 2016-12-30 (×5): 50 mg via ORAL
  Filled 2016-12-24 (×5): qty 1

## 2016-12-24 MED ORDER — PROMETHAZINE HCL 25 MG RE SUPP
12.5000 mg | Freq: Four times a day (QID) | RECTAL | Status: DC | PRN
  Filled 2016-12-24: qty 1

## 2016-12-24 MED ORDER — LIP MEDEX EX OINT
1.0000 "application " | TOPICAL_OINTMENT | Freq: Two times a day (BID) | CUTANEOUS | Status: DC
  Administered 2016-12-24 – 2016-12-30 (×9): 1 via TOPICAL
  Filled 2016-12-24 (×4): qty 7

## 2016-12-24 MED ORDER — METHOCARBAMOL 500 MG PO TABS
1000.0000 mg | ORAL_TABLET | Freq: Four times a day (QID) | ORAL | Status: DC | PRN
  Administered 2016-12-25 – 2016-12-28 (×6): 1000 mg via ORAL
  Filled 2016-12-24 (×6): qty 2

## 2016-12-24 MED ORDER — METOCLOPRAMIDE HCL 5 MG PO TABS: 5.0000 mg | ORAL_TABLET | Freq: Four times a day (QID) | ORAL | Status: DC | PRN

## 2016-12-24 MED ORDER — PANTOPRAZOLE SODIUM 40 MG IV SOLR: 40.0000 mg | INTRAVENOUS | Status: DC

## 2016-12-24 MED ORDER — PROMETHAZINE HCL 25 MG/ML IJ SOLN
6.2500 mg | Freq: Four times a day (QID) | INTRAMUSCULAR | Status: DC | PRN
  Administered 2016-12-24 – 2016-12-26 (×2): 6.25 mg via INTRAVENOUS
  Filled 2016-12-24 (×2): qty 1

## 2016-12-24 MED ORDER — PHENOL 1.4 % MT LIQD
2.0000 | OROMUCOSAL | Status: DC | PRN
  Filled 2016-12-24: qty 177

## 2016-12-24 MED ORDER — HYDRALAZINE HCL 20 MG/ML IJ SOLN: 10.0000 mg | INTRAMUSCULAR | Status: DC | PRN

## 2016-12-24 MED ORDER — MORPHINE SULFATE (PF) 4 MG/ML IV SOLN
2.0000 mg | INTRAVENOUS | Status: DC | PRN
  Administered 2016-12-24 – 2016-12-28 (×21): 4 mg via INTRAVENOUS
  Administered 2016-12-28: 2 mg via INTRAVENOUS
  Administered 2016-12-28 – 2016-12-29 (×6): 4 mg via INTRAVENOUS
  Filled 2016-12-24 (×20): qty 1

## 2016-12-24 MED ORDER — ENOXAPARIN SODIUM 40 MG/0.4ML ~~LOC~~ SOLN: 40.0000 mg | SUBCUTANEOUS | Status: DC

## 2016-12-24 MED ORDER — DIPHENHYDRAMINE HCL 25 MG PO CAPS: 25.0000 mg | ORAL_CAPSULE | Freq: Four times a day (QID) | ORAL | Status: DC | PRN

## 2016-12-24 MED ORDER — GABAPENTIN 300 MG PO CAPS
300.0000 mg | ORAL_CAPSULE | Freq: Every day | ORAL | Status: DC
  Administered 2016-12-24 – 2016-12-29 (×6): 300 mg via ORAL
  Filled 2016-12-24 (×6): qty 1

## 2016-12-24 MED ORDER — MENTHOL 3 MG MT LOZG: 1.0000 | LOZENGE | OROMUCOSAL | Status: DC | PRN

## 2016-12-24 MED ORDER — METOCLOPRAMIDE HCL 5 MG/ML IJ SOLN: 5.0000 mg | Freq: Four times a day (QID) | INTRAMUSCULAR | Status: DC | PRN

## 2016-12-24 MED ORDER — DIPHENHYDRAMINE HCL 50 MG/ML IJ SOLN: 25.0000 mg | Freq: Four times a day (QID) | INTRAMUSCULAR | Status: DC | PRN

## 2016-12-24 MED ORDER — MORPHINE SULFATE (PF) 4 MG/ML IV SOLN
2.0000 mg | INTRAVENOUS | Status: DC | PRN
  Filled 2016-12-24 (×6): qty 1

## 2016-12-24 MED ORDER — TRAMADOL HCL 50 MG PO TABS
50.0000 mg | ORAL_TABLET | Freq: Four times a day (QID) | ORAL | Status: DC | PRN
  Administered 2016-12-26 – 2016-12-28 (×5): 100 mg via ORAL
  Filled 2016-12-24 (×6): qty 2

## 2016-12-24 MED ORDER — ONDANSETRON 4 MG PO TBDP
4.0000 mg | ORAL_TABLET | Freq: Four times a day (QID) | ORAL | Status: DC | PRN
  Administered 2016-12-24 – 2016-12-26 (×2): 4 mg via ORAL
  Filled 2016-12-24 (×2): qty 1

## 2016-12-24 MED ORDER — METHOCARBAMOL 1000 MG/10ML IJ SOLN
500.0000 mg | Freq: Three times a day (TID) | INTRAVENOUS | Status: DC | PRN
  Filled 2016-12-24 (×3): qty 5

## 2016-12-24 MED ORDER — ONDANSETRON HCL 4 MG/2ML IJ SOLN
4.0000 mg | Freq: Four times a day (QID) | INTRAMUSCULAR | Status: DC | PRN
  Administered 2016-12-25: 4 mg via INTRAVENOUS
  Filled 2016-12-24: qty 2

## 2016-12-24 MED ORDER — FENTANYL CITRATE (PF) 100 MCG/2ML IJ SOLN
25.0000 ug | Freq: Once | INTRAMUSCULAR | Status: AC
  Administered 2016-12-24: 25 ug via INTRAVENOUS
  Filled 2016-12-24: qty 2

## 2016-12-24 MED ORDER — DIPHENHYDRAMINE HCL 50 MG/ML IJ SOLN
12.5000 mg | Freq: Four times a day (QID) | INTRAMUSCULAR | Status: DC | PRN
  Administered 2016-12-25: 12.5 mg via INTRAVENOUS
  Administered 2016-12-25: 25 mg via INTRAVENOUS
  Administered 2016-12-27: 12.5 mg via INTRAVENOUS
  Administered 2016-12-27: 25 mg via INTRAVENOUS
  Filled 2016-12-24 (×4): qty 1

## 2016-12-24 MED ORDER — ACETAMINOPHEN 650 MG RE SUPP: 650.0000 mg | Freq: Four times a day (QID) | RECTAL | Status: DC | PRN

## 2016-12-24 MED ORDER — MORPHINE SULFATE (PF) 4 MG/ML IV SOLN
1.0000 mg | INTRAVENOUS | Status: DC | PRN
  Administered 2016-12-24 (×2): 2 mg via INTRAVENOUS
  Filled 2016-12-24 (×2): qty 1

## 2016-12-24 MED ORDER — PROCHLORPERAZINE EDISYLATE 5 MG/ML IJ SOLN: 5.0000 mg | INTRAMUSCULAR | Status: DC | PRN

## 2016-12-24 MED ORDER — LEVOTHYROXINE SODIUM 75 MCG PO TABS
75.0000 ug | ORAL_TABLET | Freq: Every day | ORAL | Status: DC
  Administered 2016-12-26 – 2016-12-30 (×5): 75 ug via ORAL
  Filled 2016-12-24 (×5): qty 1

## 2016-12-24 MED ORDER — HYDRALAZINE HCL 20 MG/ML IJ SOLN: 5.0000 mg | Freq: Four times a day (QID) | INTRAMUSCULAR | Status: DC | PRN

## 2016-12-24 MED ORDER — SIMETHICONE 80 MG PO CHEW
40.0000 mg | CHEWABLE_TABLET | Freq: Four times a day (QID) | ORAL | Status: DC | PRN
  Administered 2016-12-26 – 2016-12-30 (×3): 40 mg via ORAL
  Filled 2016-12-24 (×3): qty 1

## 2016-12-24 MED ORDER — MORPHINE SULFATE (PF) 4 MG/ML IV SOLN
2.0000 mg | INTRAVENOUS | Status: DC | PRN
  Filled 2016-12-24 (×3): qty 1

## 2016-12-24 MED ORDER — POTASSIUM CHLORIDE IN NACL 20-0.9 MEQ/L-% IV SOLN
INTRAVENOUS | Status: DC
  Administered 2016-12-25 – 2016-12-29 (×8): via INTRAVENOUS
  Administered 2016-12-30: 1000 mL via INTRAVENOUS
  Filled 2016-12-24 (×17): qty 1000

## 2016-12-24 MED ORDER — SACCHAROMYCES BOULARDII 250 MG PO CAPS
250.0000 mg | ORAL_CAPSULE | Freq: Two times a day (BID) | ORAL | Status: DC
  Administered 2016-12-24 – 2016-12-30 (×11): 250 mg via ORAL
  Filled 2016-12-24 (×11): qty 1

## 2016-12-24 MED ORDER — MORPHINE SULFATE (PF) 4 MG/ML IV SOLN: 2.0000 mg | INTRAVENOUS | Status: DC | PRN

## 2016-12-24 MED ORDER — METOPROLOL TARTRATE 5 MG/5ML IV SOLN: 5.0000 mg | Freq: Four times a day (QID) | INTRAVENOUS | Status: DC | PRN

## 2016-12-24 MED ORDER — LACTATED RINGERS IV BOLUS (SEPSIS): 1000.0000 mL | Freq: Three times a day (TID) | INTRAVENOUS | Status: AC | PRN

## 2016-12-24 MED ORDER — MAGIC MOUTHWASH
15.0000 mL | Freq: Four times a day (QID) | ORAL | Status: DC | PRN
  Filled 2016-12-24: qty 15

## 2016-12-24 MED ORDER — ACETAMINOPHEN 325 MG PO TABS
650.0000 mg | ORAL_TABLET | Freq: Four times a day (QID) | ORAL | Status: DC | PRN
  Administered 2016-12-26: 650 mg via ORAL
  Filled 2016-12-24: qty 2

## 2016-12-24 MED ORDER — METHOCARBAMOL 1000 MG/10ML IJ SOLN
1000.0000 mg | Freq: Four times a day (QID) | INTRAVENOUS | Status: DC | PRN
  Administered 2016-12-24 – 2016-12-26 (×2): 1000 mg via INTRAVENOUS
  Filled 2016-12-24 (×5): qty 10

## 2016-12-24 MED ORDER — BISACODYL 10 MG RE SUPP: 10.0000 mg | Freq: Two times a day (BID) | RECTAL | Status: DC | PRN

## 2016-12-24 MED ORDER — SODIUM CHLORIDE 0.9 % IV BOLUS (SEPSIS)
500.0000 mL | INTRAVENOUS | Status: AC
  Administered 2016-12-24: 500 mL via INTRAVENOUS

## 2016-12-24 MED ORDER — PHENAZOPYRIDINE HCL 100 MG PO TABS
100.0000 mg | ORAL_TABLET | Freq: Three times a day (TID) | ORAL | Status: DC | PRN
  Administered 2016-12-24: 100 mg via ORAL
  Filled 2016-12-24 (×2): qty 1
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
## 2016-12-24 NOTE — ED Notes (Signed)
GI at bedside
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
## 2016-12-24 NOTE — Progress Notes (Signed)
Pt arrived on floor in pain from abdomen.  Vomited x 1, green emesis. Pt had Zofran in ED but it was not holding.  Got an order from MD for Phenergan. Will continue to monitor.

## 2016-12-24 NOTE — ED Triage Notes (Signed)
Pt has gallstones and abdominal pain.  Seen x 3 recently.  Pt was to call primary care.  Can't get into appt.

## 2016-12-25 ENCOUNTER — Observation Stay (HOSPITAL_COMMUNITY): Payer: Self-pay | Admitting: Anesthesiology

## 2016-12-25 ENCOUNTER — Observation Stay (HOSPITAL_COMMUNITY): Payer: Self-pay

## 2016-12-25 ENCOUNTER — Encounter (HOSPITAL_COMMUNITY)
Admission: EM | Disposition: A | Discharge status: Home / Self Care | Payer: Self-pay | Source: Home / Self Care | Attending: General Surgery

## 2016-12-25 DIAGNOSIS — Z9049 Acquired absence of other specified parts of digestive tract: Secondary | ICD-10-CM

## 2016-12-25 HISTORY — PX: CHOLECYSTECTOMY: SHX55

## 2016-12-25 LAB — CBC
HCT: 26.3 % — ABNORMAL LOW (ref 36.0–46.0)
HEMOGLOBIN: 8.8 g/dL — AB (ref 12.0–15.0)
MCH: 35.2 pg — AB (ref 26.0–34.0)
MCHC: 33.5 g/dL (ref 30.0–36.0)
MCV: 105.2 fL — AB (ref 78.0–100.0)
Platelets: 156 10*3/uL (ref 150–400)
RBC: 2.5 MIL/uL — AB (ref 3.87–5.11)
RDW: 15.2 % (ref 11.5–15.5)
WBC: 8 10*3/uL (ref 4.0–10.5)

## 2016-12-25 LAB — CBC WITH DIFFERENTIAL/PLATELET
BASOS PCT: 0 %
Basophils Absolute: 0 10*3/uL (ref 0.0–0.1)
EOS ABS: 0.1 10*3/uL (ref 0.0–0.7)
Eosinophils Relative: 1 %
HCT: 25.4 % — ABNORMAL LOW (ref 36.0–46.0)
HEMOGLOBIN: 9.1 g/dL — AB (ref 12.0–15.0)
LYMPHS PCT: 9 %
Lymphs Abs: 0.6 10*3/uL — ABNORMAL LOW (ref 0.7–4.0)
MCH: 34 pg (ref 26.0–34.0)
MCHC: 35.8 g/dL (ref 30.0–36.0)
MCV: 94.8 fL (ref 78.0–100.0)
MONO ABS: 0.1 10*3/uL (ref 0.1–1.0)
Monocytes Relative: 2 %
Neutro Abs: 5.5 10*3/uL (ref 1.7–7.7)
Neutrophils Relative %: 88 %
Platelets: 114 10*3/uL — ABNORMAL LOW (ref 150–400)
RBC: 2.68 MIL/uL — ABNORMAL LOW (ref 3.87–5.11)
RDW: 22.1 % — ABNORMAL HIGH (ref 11.5–15.5)
WBC: 6.3 10*3/uL (ref 4.0–10.5)

## 2016-12-25 LAB — COMPREHENSIVE METABOLIC PANEL
ALT: 14 U/L (ref 14–54)
ANION GAP: 10 (ref 5–15)
AST: 51 U/L — ABNORMAL HIGH (ref 15–41)
Albumin: 3.1 g/dL — ABNORMAL LOW (ref 3.5–5.0)
Alkaline Phosphatase: 143 U/L — ABNORMAL HIGH (ref 38–126)
BUN: 10 mg/dL (ref 6–20)
CHLORIDE: 105 mmol/L (ref 101–111)
CO2: 21 mmol/L — ABNORMAL LOW (ref 22–32)
Calcium: 8.8 mg/dL — ABNORMAL LOW (ref 8.9–10.3)
Creatinine, Ser: 0.77 mg/dL (ref 0.44–1.00)
GFR calc non Af Amer: >60 mL/min (ref >60)
Glucose, Bld: 92 mg/dL (ref 65–99)
POTASSIUM: 3 mmol/L — AB (ref 3.5–5.1)
SODIUM: 136 mmol/L (ref 135–145)
Total Bilirubin: 2 mg/dL — ABNORMAL HIGH (ref 0.3–1.2)
Total Protein: 7.6 g/dL (ref 6.5–8.1)

## 2016-12-25 LAB — PREPARE RBC (CROSSMATCH)

## 2016-12-25 LAB — IGG 4: IGG 4: 4 mg/dL (ref 2–96)

## 2016-12-25 LAB — SURGICAL PCR SCREEN
MRSA, PCR: NEGATIVE
STAPHYLOCOCCUS AUREUS: NEGATIVE

## 2016-12-25 LAB — HIV ANTIBODY (ROUTINE TESTING W REFLEX): HIV Screen 4th Generation wRfx: NONREACTIVE

## 2016-12-25 LAB — PROTIME-INR
INR: 1.97
INR: 1.54
PROTHROMBIN TIME: 22.7 s — AB (ref 11.4–15.2)
PROTHROMBIN TIME: 18.6 s — AB (ref 11.4–15.2)

## 2016-12-25 SURGERY — LAPAROSCOPIC CHOLECYSTECTOMY WITH INTRAOPERATIVE CHOLANGIOGRAM
Anesthesia: General

## 2016-12-25 MED ORDER — IOPAMIDOL (ISOVUE-300) INJECTION 61%
INTRAVENOUS | Status: AC
  Filled 2016-12-25: qty 50

## 2016-12-25 MED ORDER — LACTATED RINGERS IV SOLN
INTRAVENOUS | Status: DC | PRN
  Administered 2016-12-25: 12:00:00 via INTRAVENOUS

## 2016-12-25 MED ORDER — SODIUM CHLORIDE 0.9 % IV SOLN: Freq: Once | INTRAVENOUS | Status: DC

## 2016-12-25 MED ORDER — ARTIFICIAL TEARS OP OINT
TOPICAL_OINTMENT | OPHTHALMIC | Status: AC
  Filled 2016-12-25: qty 3.5

## 2016-12-25 MED ORDER — MIDAZOLAM HCL 5 MG/5ML IJ SOLN
INTRAMUSCULAR | Status: DC | PRN
  Administered 2016-12-25: 2 mg via INTRAVENOUS

## 2016-12-25 MED ORDER — FENTANYL CITRATE (PF) 100 MCG/2ML IJ SOLN
INTRAMUSCULAR | Status: AC
  Filled 2016-12-25: qty 2

## 2016-12-25 MED ORDER — CEFAZOLIN SODIUM-DEXTROSE 2-4 GM/100ML-% IV SOLN
2.0000 g | INTRAVENOUS | Status: AC
  Administered 2016-12-25: 2 g via INTRAVENOUS
  Filled 2016-12-25: qty 100

## 2016-12-25 MED ORDER — DEXAMETHASONE SODIUM PHOSPHATE 10 MG/ML IJ SOLN
INTRAMUSCULAR | Status: AC
  Filled 2016-12-25: qty 1

## 2016-12-25 MED ORDER — SODIUM CHLORIDE 0.9 % IV SOLN
INTRAVENOUS | Status: DC | PRN
  Administered 2016-12-25 (×2): via INTRAVENOUS

## 2016-12-25 MED ORDER — FENTANYL CITRATE (PF) 100 MCG/2ML IJ SOLN
INTRAMUSCULAR | Status: DC | PRN
  Administered 2016-12-25 (×4): 50 ug via INTRAVENOUS

## 2016-12-25 MED ORDER — ONDANSETRON HCL 4 MG/2ML IJ SOLN
INTRAMUSCULAR | Status: DC | PRN
  Administered 2016-12-25: 4 mg via INTRAVENOUS

## 2016-12-25 MED ORDER — LACTATED RINGERS IV SOLN
INTRAVENOUS | Status: DC | PRN
  Administered 2016-12-25 (×2): via INTRAVENOUS

## 2016-12-25 MED ORDER — PROPOFOL 10 MG/ML IV BOLUS
INTRAVENOUS | Status: DC | PRN
  Administered 2016-12-25: 200 mg via INTRAVENOUS

## 2016-12-25 MED ORDER — CHLORHEXIDINE GLUCONATE CLOTH 2 % EX PADS: 6.0000 | MEDICATED_PAD | Freq: Once | CUTANEOUS | Status: DC

## 2016-12-25 MED ORDER — MEPERIDINE HCL 50 MG/ML IJ SOLN: 6.2500 mg | INTRAMUSCULAR | Status: DC | PRN

## 2016-12-25 MED ORDER — HYDROMORPHONE HCL 1 MG/ML IJ SOLN
INTRAMUSCULAR | Status: AC
  Filled 2016-12-25: qty 1

## 2016-12-25 MED ORDER — LIDOCAINE 2% (20 MG/ML) 5 ML SYRINGE
INTRAMUSCULAR | Status: DC | PRN
  Administered 2016-12-25: 100 mg via INTRAVENOUS

## 2016-12-25 MED ORDER — BUPIVACAINE-EPINEPHRINE 0.25% -1:200000 IJ SOLN
INTRAMUSCULAR | Status: AC
  Filled 2016-12-25: qty 1

## 2016-12-25 MED ORDER — ENOXAPARIN SODIUM 40 MG/0.4ML ~~LOC~~ SOLN
40.0000 mg | SUBCUTANEOUS | Status: DC
  Administered 2016-12-26 – 2016-12-30 (×5): 40 mg via SUBCUTANEOUS
  Filled 2016-12-25 (×5): qty 0.4

## 2016-12-25 MED ORDER — LIDOCAINE 2% (20 MG/ML) 5 ML SYRINGE
INTRAMUSCULAR | Status: AC
  Filled 2016-12-25: qty 5

## 2016-12-25 MED ORDER — MIDAZOLAM HCL 2 MG/2ML IJ SOLN
INTRAMUSCULAR | Status: AC
  Filled 2016-12-25: qty 2

## 2016-12-25 MED ORDER — SUGAMMADEX SODIUM 200 MG/2ML IV SOLN
INTRAVENOUS | Status: AC
  Filled 2016-12-25: qty 2

## 2016-12-25 MED ORDER — ONDANSETRON HCL 4 MG/2ML IJ SOLN
INTRAMUSCULAR | Status: AC
  Filled 2016-12-25: qty 2

## 2016-12-25 MED ORDER — SUCCINYLCHOLINE CHLORIDE 200 MG/10ML IV SOSY
PREFILLED_SYRINGE | INTRAVENOUS | Status: DC | PRN
  Administered 2016-12-25: 100 mg via INTRAVENOUS

## 2016-12-25 MED ORDER — CEFAZOLIN SODIUM-DEXTROSE 2-4 GM/100ML-% IV SOLN
INTRAVENOUS | Status: AC
  Filled 2016-12-25: qty 100

## 2016-12-25 MED ORDER — SUGAMMADEX SODIUM 200 MG/2ML IV SOLN
INTRAVENOUS | Status: DC | PRN
  Administered 2016-12-25: 200 mg via INTRAVENOUS

## 2016-12-25 MED ORDER — GADOBENATE DIMEGLUMINE 529 MG/ML IV SOLN
13.0000 mL | Freq: Once | INTRAVENOUS | Status: AC | PRN
  Administered 2016-12-25: 13 mL via INTRAVENOUS

## 2016-12-25 MED ORDER — LACTATED RINGERS IV SOLN
INTRAVENOUS | Status: DC | PRN
  Administered 2016-12-25: 1000 mL

## 2016-12-25 MED ORDER — DEXAMETHASONE SODIUM PHOSPHATE 10 MG/ML IJ SOLN
INTRAMUSCULAR | Status: DC | PRN
  Administered 2016-12-25: 10 mg via INTRAVENOUS

## 2016-12-25 MED ORDER — HYDROMORPHONE HCL 1 MG/ML IJ SOLN
0.2500 mg | INTRAMUSCULAR | Status: DC | PRN
  Administered 2016-12-25 (×4): 0.5 mg via INTRAVENOUS

## 2016-12-25 MED ORDER — PROMETHAZINE HCL 25 MG/ML IJ SOLN: 6.2500 mg | INTRAMUSCULAR | Status: DC | PRN

## 2016-12-25 MED ORDER — ROCURONIUM BROMIDE 50 MG/5ML IV SOSY
PREFILLED_SYRINGE | INTRAVENOUS | Status: AC
  Filled 2016-12-25: qty 5

## 2016-12-25 MED ORDER — ROCURONIUM BROMIDE 10 MG/ML (PF) SYRINGE
PREFILLED_SYRINGE | INTRAVENOUS | Status: DC | PRN
  Administered 2016-12-25: 30 mg via INTRAVENOUS
  Administered 2016-12-25 (×2): 10 mg via INTRAVENOUS

## 2016-12-25 MED ORDER — PROPOFOL 10 MG/ML IV BOLUS
INTRAVENOUS | Status: AC
  Filled 2016-12-25: qty 20

## 2016-12-25 SURGICAL SUPPLY — 31 items
APPLIER CLIP 5 13 M/L LIGAMAX5 (MISCELLANEOUS) ×2
CABLE HIGH FREQUENCY MONO STRZ (ELECTRODE) ×2 IMPLANT
CATH REDDICK CHOLANGI 4FR 50CM (CATHETERS) ×2 IMPLANT
CHLORAPREP W/TINT 26ML (MISCELLANEOUS) ×2 IMPLANT
CLIP APPLIE 5 13 M/L LIGAMAX5 (MISCELLANEOUS) ×1 IMPLANT
COVER MAYO STAND STRL (DRAPES) ×2 IMPLANT
COVER SURGICAL LIGHT HANDLE (MISCELLANEOUS) ×2 IMPLANT
DECANTER SPIKE VIAL GLASS SM (MISCELLANEOUS) ×2 IMPLANT
DERMABOND ADVANCED (GAUZE/BANDAGES/DRESSINGS) ×1
DERMABOND ADVANCED .7 DNX12 (GAUZE/BANDAGES/DRESSINGS) ×1 IMPLANT
DRAPE C-ARM 42X120 X-RAY (DRAPES) ×2 IMPLANT
ELECT REM PT RETURN 9FT ADLT (ELECTROSURGICAL) ×2
ELECTRODE REM PT RTRN 9FT ADLT (ELECTROSURGICAL) ×1 IMPLANT
GLOVE BIO SURGEON STRL SZ7.5 (GLOVE) ×2 IMPLANT
GOWN STRL REUS W/TWL XL LVL3 (GOWN DISPOSABLE) ×6 IMPLANT
HEMOSTAT SURGICEL 2X14 (HEMOSTASIS) ×4 IMPLANT
HEMOSTAT SURGICEL 4X8 (HEMOSTASIS) IMPLANT
IRRIG SUCT STRYKERFLOW 2 WTIP (MISCELLANEOUS) ×2
IRRIGATION SUCT STRKRFLW 2 WTP (MISCELLANEOUS) ×1 IMPLANT
IV CATH 14GX2 1/4 (CATHETERS) ×2 IMPLANT
KIT BASIN OR (CUSTOM PROCEDURE TRAY) ×2 IMPLANT
POUCH SPECIMEN RETRIEVAL 10MM (ENDOMECHANICALS) ×2 IMPLANT
SCISSORS LAP 5X35 DISP (ENDOMECHANICALS) ×2 IMPLANT
SLEEVE XCEL OPT CAN 5 100 (ENDOMECHANICALS) ×4 IMPLANT
SUT MNCRL AB 4-0 PS2 18 (SUTURE) ×2 IMPLANT
TOWEL OR 17X26 10 PK STRL BLUE (TOWEL DISPOSABLE) ×2 IMPLANT
TOWEL OR NON WOVEN STRL DISP B (DISPOSABLE) ×2 IMPLANT
TRAY LAPAROSCOPIC (CUSTOM PROCEDURE TRAY) ×2 IMPLANT
TROCAR BLADELESS OPT 5 100 (ENDOMECHANICALS) ×2 IMPLANT
TROCAR XCEL BLUNT TIP 100MML (ENDOMECHANICALS) ×2 IMPLANT
TUBING INSUF HEATED (TUBING) ×2 IMPLANT

## 2016-12-25 NOTE — Progress Notes (Signed)
Dr Sabra Heck at bedside.  Continue to give emergent blood in pacu along with 2 Units emergent FFP.

## 2016-12-25 NOTE — Transfer of Care (Signed)
Immediate Anesthesia Transfer of Care Note  Patient: Jamie Allen  Procedure(s) Performed: Procedure(s): LAPAROSCOPIC CHOLECYSTECTOMY WITH INTRAOPERATIVE CHOLANGIOGRAM (N/A)  Patient Location: PACU  Anesthesia Type:General  Level of Consciousness: sedated  Airway & Oxygen Therapy: Patient Spontanous Breathing and Patient connected to face mask oxygen  Post-op Assessment: Report given to RN and Post -op Vital signs reviewed and stable  Post vital signs: Reviewed and stable  Last Vitals:  Vitals:   12/25/16 0442 12/25/16 0859  BP: 103/63 111/60  Pulse: 91 88  Resp: 16 16  Temp: 36.9 C 37.1 C    Last Pain:  Vitals:   12/25/16 0936  TempSrc:   PainSc: 7       Patients Stated Pain Goal: 4 (97/41/63 8453)  Complications: No apparent anesthesia complications

## 2016-12-25 NOTE — Op Note (Signed)
12/24/2016 - 12/25/2016  1:47 PM  PATIENT:  Jamie Allen  46 y.o. female  PRE-OPERATIVE DIAGNOSIS:  billiary cholic  POST-OPERATIVE DIAGNOSIS:  billiary cholic  PROCEDURE:  Procedure(s): LAPAROSCOPIC CHOLECYSTECTOMY WITH INTRAOPERATIVE CHOLANGIOGRAM (N/A)  SURGEON:  Surgeon(s) and Role:    * Jovita Kussmaul, MD - Primary    * Alphonsa Overall, MD - Assisting  PHYSICIAN ASSISTANT:   ASSISTANTS: Dr. Lucia Gaskins   ANESTHESIA:   local and general  EBL:  Total I/O In: 2402 [I.V.:1800; Blood:602] Out: 500 [Blood:500]  BLOOD ADMINISTERED:1 unit CC PRBC and 2 units FFP  DRAINS: none   LOCAL MEDICATIONS USED:  MARCAINE     SPECIMEN:  Source of Specimen:  gallbladder  DISPOSITION OF SPECIMEN:  PATHOLOGY  COUNTS:  YES  TOURNIQUET:  * No tourniquets in log *  DICTATION: .Dragon Dictation  Procedure: After informed consent was obtained the patient was brought to the operating room and placed in the supine position on the operating room table. After adequate induction of general anesthesia the patient's abdomen was prepped with ChloraPrep allowed to dry and draped in usual sterile manner. The area below the umbilicus was infiltrated with quarter percent  Marcaine. A small incision was made with a 15 blade knife. The incision was carried down through the subcutaneous tissue bluntly with a hemostat and Army-Navy retractors. The linea alba was identified. The linea alba was incised with a 15 blade knife and each side was grasped with Coker clamps. The preperitoneal space was then probed with a hemostat until the peritoneum was opened and access was gained to the abdominal cavity. A 0 Vicryl pursestring stitch was placed in the fascia surrounding the opening. A Hassan cannula was then placed through the opening and anchored in place with the previously placed Vicryl purse string stitch. The abdomen was insufflated with carbon dioxide without difficulty. A laparoscope was inserted through the [facility]  cannula in the right upper quadrant was inspected. Next the epigastric region was infiltrated with % Marcaine. A small incision was made with a 15 blade knife. A 5 mm port was placed bluntly through this incision into the abdominal cavity under direct vision. Next 2 sites were chosen laterally on the right side of the abdomen for placement of 5 mm ports. Each of these areas was infiltrated with quarter percent Marcaine. Small stab incisions were made with a 15 blade knife. 5 mm ports were then placed bluntly through these incisions into the abdominal cavity under direct vision without difficulty. A blunt grasper was placed through the lateralmost 5 mm port and used to grasp the dome of the gallbladder and elevated anteriorly and superiorly. Another blunt grasper was placed through the other 5 mm port and used to retract the body and neck of the gallbladder. A dissector was placed through the epigastric port and using the electrocautery the peritoneal reflection at the gallbladder neck was opened. Blunt dissection was then carried out in this area until the gallbladder neck-cystic duct junction was readily identified and a good window was created. A single clip was placed on the gallbladder neck. A small  ductotomy was made just below the clip with laparoscopic scissors. A 14-gauge Angiocath was then placed through the anterior abdominal wall under direct vision. A Reddick cholangiogram catheter was then placed through the Angiocath and flushed. The catheter was then placed in the cystic duct and anchored in place with a clip. A cholangiogram was obtained that showed no filling defects good emptying into the duodenum an  adequate length on the cystic duct. The anchoring clip and catheters were then removed from the patient. 3 clips were placed proximally on the cystic duct and the duct was divided between the 2 sets of clips. Posterior to this the cystic artery was identified and again dissected bluntly in a  circumferential manner until a good window  was created. 2 clips were placed proximally and one distally on the artery and the artery was divided between the 2 sets of clips. Next a laparoscopic hook cautery device was used to separate the gallbladder from the liver bed. During this part of the dissection we encountered a vein running along the gallbladder bed that started bleeding. This was controlled with application of Surgiciflow and Surgicel. After close monitoring for at least 15-20 minutes the bleeding appeared to have stopped and the liver bed appeared hemostatic. A laparoscopic bag was inserted through the hassan port. The laparoscope was moved to the epigastric port. The gallbladder was placed within the bag and the bag was sealed.  The bag with the gallbladder was then removed with the [facility] cannula through the infraumbilical port without difficulty. The fascial defect was then closed with the previously placed Vicryl pursestring stitch as well as with another figure-of-eight 0 Vicryl stitch. The liver bed was inspected again and found to be hemostatic. The abdomen was irrigated with copious amounts of saline until the effluent was clear. The ports were then removed under direct vision without difficulty and were found to be hemostatic. The gas was allowed to escape. The skin incisions were all closed with interrupted 4-0 Monocryl subcuticular stitches. Dermabond dressings were applied. The patient tolerated the procedure well. At the end of the case all needle sponge and instrument counts were correct. The patient was then awakened and taken to recovery in stable condition. She will be observed in the ICU and we will give another unit of blood and 2 more units of FFP. She received 1 unit of blood and 2 units of FFP during the case.   PLAN OF CARE: Admit to inpatient   PATIENT DISPOSITION:  PACU - guarded condition.   Delay start of Pharmacological VTE agent (>24hrs) due to surgical blood loss or  risk of bleeding: yes

## 2016-12-25 NOTE — Anesthesia Preprocedure Evaluation (Signed)
Anesthesia Evaluation  Patient identified by MRN, date of birth, ID band Patient awake    Reviewed: Allergy & Precautions, NPO status , Patient's Chart, lab work & pertinent test results  Airway Mallampati: II  TM Distance: >3 FB Neck ROM: Full    Dental no notable dental hx.    Pulmonary neg pulmonary ROS, Current Smoker,    Pulmonary exam normal breath sounds clear to auscultation       Cardiovascular hypertension, Normal cardiovascular exam Rhythm:Regular Rate:Normal     Neuro/Psych  Headaches, PSYCHIATRIC DISORDERS Anxiety    GI/Hepatic Neg liver ROS, GERD  ,(+) Hepatitis -  Endo/Other  negative endocrine ROS  Renal/GU negative Renal ROS     Musculoskeletal negative musculoskeletal ROS (+)   Abdominal   Peds  Hematology negative hematology ROS (+) anemia ,   Anesthesia Other Findings   Reproductive/Obstetrics negative OB ROS                             Anesthesia Physical  Anesthesia Plan  ASA: II  Anesthesia Plan: General   Post-op Pain Management:  Regional for Post-op pain   Induction: Intravenous  Airway Management Planned: Oral ETT  Additional Equipment:   Intra-op Plan:   Post-operative Plan: Extubation in OR  Informed Consent: I have reviewed the patients History and Physical, chart, labs and discussed the procedure including the risks, benefits and alternatives for the proposed anesthesia with the patient or authorized representative who has indicated his/her understanding and acceptance.   Dental advisory given  Plan Discussed with: CRNA  Anesthesia Plan Comments:         Anesthesia Quick Evaluation

## 2016-12-25 NOTE — Progress Notes (Signed)
eLink Physician-Brief Progress Note Patient Name: Jamie Allen DOB: 05/02/1971 MRN: [id number]   Date of Service  12/25/2016  HPI/Events of Note  Patient status post laparoscopic cholecystectomy. Operative note reviewed. No obvious complication. Patient hemodynamically stable postoperatively with oxygen requirement. Appears in no distress on camera check.   eICU Interventions  Continuing plan of care per surgery service.      Intervention Category Evaluation Type: New Patient Evaluation  Tera Partridge 12/25/2016, 4:21 PM

## 2016-12-25 NOTE — Interval H&P Note (Signed)
History and Physical Interval Note:  12/25/2016 11:29 AM  Jamie Allen  has presented today for surgery, with the diagnosis of billiary cholic  The various methods of treatment have been discussed with the patient and family. After consideration of risks, benefits and other options for treatment, the patient has consented to  Procedure(s): LAPAROSCOPIC CHOLECYSTECTOMY WITH INTRAOPERATIVE CHOLANGIOGRAM WITH LIVER BIOPSY (N/A) as a surgical intervention .  The patient's history has been reviewed, patient examined, no change in status, stable for surgery.  I have reviewed the patient's chart and labs.  Questions were answered to the patient's satisfaction.     TOTH III,PAUL S

## 2016-12-25 NOTE — Anesthesia Procedure Notes (Signed)
Procedure Name: Intubation Date/Time: 12/25/2016 11:27 AM Performed by: Lind Covert Pre-anesthesia Checklist: Patient identified, Timeout performed, Emergency Drugs available, Suction available and Patient being monitored Patient Re-evaluated:Patient Re-evaluated prior to inductionOxygen Delivery Method: Circle system utilized Preoxygenation: Pre-oxygenation with 100% oxygen Intubation Type: IV induction, Cricoid Pressure applied and Rapid sequence Laryngoscope Size: Mac and 3 Grade View: Grade I Tube type: Oral Tube size: 7.0 mm Number of attempts: 1 Airway Equipment and Method: Stylet Placement Confirmation: ETT inserted through vocal cords under direct vision,  positive ETCO2 and breath sounds checked- equal and bilateral Secured at: 20 cm Tube secured with: Tape Dental Injury: Teeth and Oropharynx as per pre-operative assessment

## 2016-12-25 NOTE — Progress Notes (Signed)
Patient was in OR during rounds today. Operative report reviewed. Please keep her on PPI. Hopefully symptoms improve s/p cholecystectomy. Awaiting labs for chronic liver diseases and workup for suspected cirrhosis.   Call with questions we will reassess her tomorrow.   [place] Cellar, MD [facility] Pager [phone]

## 2016-12-25 NOTE — Anesthesia Postprocedure Evaluation (Signed)
Anesthesia Post Note  Patient: [name]  Procedure(s) Performed: Procedure(s) (LRB): LAPAROSCOPIC CHOLECYSTECTOMY WITH INTRAOPERATIVE CHOLANGIOGRAM (N/A)  Patient location during evaluation: PACU Anesthesia Type: General Level of consciousness: awake and alert Pain management: pain level controlled Vital Signs Assessment: post-procedure vital signs reviewed and stable Respiratory status: spontaneous breathing, nonlabored ventilation and respiratory function stable Cardiovascular status: blood pressure returned to baseline and stable Postop Assessment: no signs of nausea or vomiting Anesthetic complications: no Comments: FFP and PRBC's given in PACU per surgeon's request.       Last Vitals:  Vitals:   12/25/16 1530 12/25/16 1531  BP:  (!) 120/91  Pulse: 90 91  Resp: 15 11  Temp:  36.6 C    Last Pain:  Vitals:   12/25/16 1531  TempSrc: Oral  PainSc:                  Lynda Rainwater

## 2016-12-26 ENCOUNTER — Inpatient Hospital Stay: Payer: Self-pay

## 2016-12-26 ENCOUNTER — Encounter (HOSPITAL_COMMUNITY): Payer: Self-pay | Admitting: General Surgery

## 2016-12-26 DIAGNOSIS — R1013 Epigastric pain: Secondary | ICD-10-CM

## 2016-12-26 LAB — HEPATITIS PANEL, ACUTE
HCV Ab: 0.2 s/co ratio (ref 0.0–0.9)
HEP B C IGM: NEGATIVE
Hep A IgM: NEGATIVE
Hepatitis B Surface Ag: NEGATIVE

## 2016-12-26 LAB — COMPREHENSIVE METABOLIC PANEL
ALBUMIN: 3 g/dL — AB (ref 3.5–5.0)
ALT: 18 U/L (ref 14–54)
ANION GAP: 8 (ref 5–15)
AST: 57 U/L — ABNORMAL HIGH (ref 15–41)
Alkaline Phosphatase: 121 U/L (ref 38–126)
BUN: 9 mg/dL (ref 6–20)
CO2: 18 mmol/L — AB (ref 22–32)
Calcium: 8.1 mg/dL — ABNORMAL LOW (ref 8.9–10.3)
Chloride: 110 mmol/L (ref 101–111)
Creatinine, Ser: 0.6 mg/dL (ref 0.44–1.00)
GFR calc Af Amer: >60 mL/min (ref >60)
GFR calc non Af Amer: >60 mL/min (ref >60)
GLUCOSE: 149 mg/dL — AB (ref 65–99)
POTASSIUM: 3.8 mmol/L (ref 3.5–5.1)
SODIUM: 136 mmol/L (ref 135–145)
TOTAL PROTEIN: 6.6 g/dL (ref 6.5–8.1)
Total Bilirubin: 2.2 mg/dL — ABNORMAL HIGH (ref 0.3–1.2)

## 2016-12-26 LAB — PREPARE FRESH FROZEN PLASMA
UNIT DIVISION: 0
Unit division: 0
Unit division: 0
Unit division: 0

## 2016-12-26 LAB — PROTIME-INR
INR: 1.71
Prothrombin Time: 20.3 seconds — ABNORMAL HIGH (ref 11.4–15.2)

## 2016-12-26 LAB — BPAM FFP
BLOOD PRODUCT EXPIRATION DATE: 201803132359
BLOOD PRODUCT EXPIRATION DATE: 201803132359
Blood Product Expiration Date: 201803132359
Blood Product Expiration Date: 201803132359
ISSUE DATE / TIME: 201803081220
ISSUE DATE / TIME: 201803081220
ISSUE DATE / TIME: 201803081351
ISSUE DATE / TIME: 201803081351
UNIT TYPE AND RH: 9500
UNIT TYPE AND RH: 5100
Unit Type and Rh: 5100
Unit Type and Rh: 5100

## 2016-12-26 LAB — ALPHA-1-ANTITRYPSIN: A1 ANTITRYPSIN SER: 351 mg/dL — AB (ref 90–200)

## 2016-12-26 LAB — CERULOPLASMIN: CERULOPLASMIN: 29 mg/dL (ref 19.0–39.0)

## 2016-12-26 LAB — CBC
HCT: 26.2 % — ABNORMAL LOW (ref 36.0–46.0)
Hemoglobin: 9.2 g/dL — ABNORMAL LOW (ref 12.0–15.0)
MCH: 33.2 pg (ref 26.0–34.0)
MCHC: 35.1 g/dL (ref 30.0–36.0)
MCV: 94.6 fL (ref 78.0–100.0)
Platelets: 124 10*3/uL — ABNORMAL LOW (ref 150–400)
RBC: 2.77 MIL/uL — ABNORMAL LOW (ref 3.87–5.11)
RDW: 22.7 % — AB (ref 11.5–15.5)
WBC: 6 10*3/uL (ref 4.0–10.5)

## 2016-12-26 LAB — IGG: IgG (Immunoglobin G), Serum: 1381 mg/dL (ref 700–1600)

## 2016-12-26 LAB — ABO/RH: ABO/RH(D): O NEG

## 2016-12-26 NOTE — Progress Notes (Signed)
Patient ID: Jamie Allen, female   DOB: April 25, 1971, 46 y.o.   MRN: [id number]  [facility] Surgery Progress Note  1 Day Post-Op  Subjective: Patient states that she is feeling better than she did preop. Abdomen sore but much less painful. She does feel distended. No flatus or BM yet. Denies n/v. States that she did ambulate once already. Dysuria has also resolved.  Objective: Vital signs in last 24 hours: Temp:  [97.4 F (36.3 C)-98.2 F (36.8 C)] 98.2 F (36.8 C) (03/09 1130) Pulse Rate:  [81-104] 86 (03/09 1000) Resp:  [7-35] 35 (03/09 1000) BP: (97-129)/(48-109) 111/70 (03/09 1000) SpO2:  [93 %-99 %] 94 % (03/09 1000) Last BM Date: 12/22/16  Intake/Output from previous day: 03/08 0701 - 03/09 0700 In: 5229 [I.V.:3773.3; Blood:1455.7] Out: 2500 [Urine:2000; Blood:500] Intake/Output this shift: Total I/O In: 400 [I.V.:400] Out: 250 [Urine:250]  PE: Gen:  Alert, NAD, pleasant Card:  RRR, no M/G/R heard Pulm:  CTAB, no W/R/R, effort normal Abd: Soft, distended, tympanic, +BS, multiple lap incisions C/D/I, appropriately tender Ext:  No erythema, edema, or tenderness   Lab Results:   Recent Labs  12/25/16 1825 12/26/16 0808  WBC 6.3 6.0  HGB 9.1* 9.2*  HCT 25.4* 26.2*  PLT 114* 124*   BMET  Recent Labs  12/25/16 0528 12/26/16 0808  NA 136 136  K 3.0* 3.8  CL 105 110  CO2 21* 18*  GLUCOSE 92 149*  BUN 10 9  CREATININE 0.77 0.60  CALCIUM 8.8* 8.1*   PT/INR  Recent Labs  12/25/16 1825 12/26/16 0808  LABPROT 18.6* 20.3*  INR 1.54 1.71   CMP     Component Value Date/Time   NA 136 12/26/2016 0808   K 3.8 12/26/2016 0808   CL 110 12/26/2016 0808   CO2 18 (L) 12/26/2016 0808   GLUCOSE 149 (H) 12/26/2016 0808   BUN 9 12/26/2016 0808   CREATININE 0.60 12/26/2016 0808   CREATININE 1.01 05/19/2016 1639   CALCIUM 8.1 (L) 12/26/2016 0808   PROT 6.6 12/26/2016 0808   PROT 6.7 10/02/2015 1455   ALBUMIN 3.0 (L) 12/26/2016 0808   AST 57 (H)  12/26/2016 0808   ALT 18 12/26/2016 0808   ALKPHOS 121 12/26/2016 0808   BILITOT 2.2 (H) 12/26/2016 0808   GFRNONAA >60 12/26/2016 0808   GFRNONAA 67 05/19/2016 1639   GFRAA >60 12/26/2016 0808   GFRAA 78 05/19/2016 1639   Lipase     Component Value Date/Time   LIPASE 32 12/24/2016 1026       Studies/Results: Dg Cholangiogram Operative  Result Date: 12/25/2016 CLINICAL DATA:  46 year old female with a history of cholelithiasis EXAM: INTRAOPERATIVE CHOLANGIOGRAM TECHNIQUE: Cholangiographic images from the C-arm fluoroscopic device were submitted for interpretation post-operatively. Please see the procedural report for the amount of contrast and the fluoroscopy time utilized. COMPARISON:  MR 12/24/2016 FINDINGS: Surgical instruments project over the upper abdomen. There is cannulation of the cystic duct/gallbladder neck, with antegrade infusion of contrast. Caliber of the extrahepatic ductal system within normal limits. No large filling defect identified. Free flow of contrast across the ampulla. IMPRESSION: Intraoperative cholangiogram demonstrates extrahepatic biliary ducts of unremarkable caliber, with no large filling defect identified. Free flow of contrast across the ampulla. Please refer to the dictated operative report for full details of intraoperative findings and procedure Electronically Signed   By: Corrie Mckusick D.O.   On: 12/25/2016 15:37   Mr 3d Recon At Scanner  Result Date: 12/25/2016 CLINICAL DATA:  Chronic  nausea, vomiting common right abdominal pain with chronically abnormal LFTs. EXAM: MRI ABDOMEN WITHOUT AND WITH CONTRAST (INCLUDING MRCP) TECHNIQUE: Multiplanar multisequence MR imaging of the abdomen was performed both before and after the administration of intravenous contrast. Heavily T2-weighted images of the biliary and pancreatic ducts were obtained, and three-dimensional MRCP images were rendered by post processing. CONTRAST:  75m MULTIHANCE GADOBENATE DIMEGLUMINE  529 MG/ML IV SOLN COMPARISON:  CT scan 12/14/2016 FINDINGS: Lower chest:  Unremarkable. Hepatobiliary: Loss of signal within the hepatic parenchyma on out of phase T1 weighted imaging is compatible with fatty deposition. The liver remains enlarged at 21.5 cm craniocaudal length.Probable small volume sludge dependently in the gallbladder lumen without discrete stones evident. No gallbladder wall thickening or pericholecystic fluid. No intra or extrahepatic biliary duct dilatation. Extrahepatic common duct measures 6 mm diameter. Common bile duct in the head of the pancreas is 5 mm diameter. Pancreas: No focal mass lesion. No dilatation of the main duct. No intraparenchymal cyst. No peripancreatic edema. Spleen: Spleen is 15.5 cm cranial caudal length. No focal mass lesion. Adrenals/Urinary Tract: No adrenal nodule or mass. Left kidney is atrophic with areas of focal cortical scarring. Right kidney unremarkable. Stomach/Bowel: Stomach is nondistended. No gastric wall thickening. No evidence of outlet obstruction. Duodenum is normally positioned as is the ligament of Treitz. No small bowel or colonic dilatation within the visualized abdomen. Vascular/Lymphatic: No abdominal aortic aneurysm. No abdominal lymphadenopathy. Other: No intraperitoneal free fluid. Musculoskeletal: No abnormal marrow enhancement within the visualized bony anatomy. IMPRESSION: 1. Hepatic steatosis with hepatomegaly. 2. No evidence of gallstones. No gallbladder wall thickening or pericholecystic fluid. No biliary ductal dilatation. No choledocholithiasis. 3. Spleen upper limits of normal for size to borderline enlarged. Electronically Signed   By: EMisty StanleyM.D.   On: 12/25/2016 07:16   Mr Abdomen Mrcp WMoise BoringContast  Result Date: 12/25/2016 CLINICAL DATA:  Chronic nausea, vomiting common right abdominal pain with chronically abnormal LFTs. EXAM: MRI ABDOMEN WITHOUT AND WITH CONTRAST (INCLUDING MRCP) TECHNIQUE: Multiplanar multisequence MR  imaging of the abdomen was performed both before and after the administration of intravenous contrast. Heavily T2-weighted images of the biliary and pancreatic ducts were obtained, and three-dimensional MRCP images were rendered by post processing. CONTRAST:  134mMULTIHANCE GADOBENATE DIMEGLUMINE 529 MG/ML IV SOLN COMPARISON:  CT scan 12/14/2016 FINDINGS: Lower chest:  Unremarkable. Hepatobiliary: Loss of signal within the hepatic parenchyma on out of phase T1 weighted imaging is compatible with fatty deposition. The liver remains enlarged at 21.5 cm craniocaudal length.Probable small volume sludge dependently in the gallbladder lumen without discrete stones evident. No gallbladder wall thickening or pericholecystic fluid. No intra or extrahepatic biliary duct dilatation. Extrahepatic common duct measures 6 mm diameter. Common bile duct in the head of the pancreas is 5 mm diameter. Pancreas: No focal mass lesion. No dilatation of the main duct. No intraparenchymal cyst. No peripancreatic edema. Spleen: Spleen is 15.5 cm cranial caudal length. No focal mass lesion. Adrenals/Urinary Tract: No adrenal nodule or mass. Left kidney is atrophic with areas of focal cortical scarring. Right kidney unremarkable. Stomach/Bowel: Stomach is nondistended. No gastric wall thickening. No evidence of outlet obstruction. Duodenum is normally positioned as is the ligament of Treitz. No small bowel or colonic dilatation within the visualized abdomen. Vascular/Lymphatic: No abdominal aortic aneurysm. No abdominal lymphadenopathy. Other: No intraperitoneal free fluid. Musculoskeletal: No abnormal marrow enhancement within the visualized bony anatomy. IMPRESSION: 1. Hepatic steatosis with hepatomegaly. 2. No evidence of gallstones. No gallbladder wall thickening or pericholecystic  fluid. No biliary ductal dilatation. No choledocholithiasis. 3. Spleen upper limits of normal for size to borderline enlarged. Electronically Signed   By: Misty Stanley M.D.   On: 12/25/2016 07:16    Anti-infectives: Anti-infectives    Start     Dose/Rate Route Frequency Ordered Stop   12/25/16 0830  ceFAZolin (ANCEF) IVPB 2g/100 mL premix     2 g 200 mL/hr over 30 Minutes Intravenous On call to O.R. 12/25/16 0746 12/25/16 1135       Assessment/Plan Billiary cholic S/p LAPAROSCOPIC CHOLECYSTECTOMY WITH INTRAOPERATIVE CHOLANGIOGRAM (N/A) 3/8 Dr. Marlou Starks - POD 1 - intraoperatively/immediate postop patient required 4u FFP and 2 uPRBC - this AM labs are slightly improved from last night: INR 1.71, Hg 9.2, Platelets 124  Chronically abnormal LFTs, hepatosplenomegaly on imaging - unable to perform liver biopsy at time of surgery due to unexpected bleeding - Workup so far includes: Hepatitis panel negative. Ceruloplasm, Alpha 1 antrypysin and IgG all normal. Weakly positive ASMA in 2014. Repeating ASMA - GI considering endo if symptoms do not improve postoperatively   Chronic macrocytic anemia - may need to start supplemental iron/folate once taking PO Dysuria - culture growing E coli, susceptibilities pending, per patient dysuria has resolved Neuropathy - on gabapentin and lyrica at home H/o c diff - probiotic Uterine fibroids Hypothyroid - synthroid Tobacco abuse  ID - ancef perioperative FEN - IVF, sip/chips from the floor VTE - SCDs  Plan - Hemoglobin/platelets/INR improving. Transfer to the floor. Ok for sips/chips but will wait to advance diet until bowel function returns. Encourage ambulation.   LOS: 1 day    Jerrye Beavers , [facility] Surgery 12/26/2016, 1:36 PM Pager: [phone] Consults: [phone] Mon-Fri 7:00 am-4:30 pm Sat-Sun 7:00 am-11:30 am

## 2016-12-26 NOTE — Progress Notes (Signed)
[facility] Progress Note  Chief Complaint:   Nausea, vomiting, abdominal pain and elevated LFTs  Subjective: Still some nausea this am. Overall feels better though.   Objective:  Vital signs in last 24 hours: Temp:  [97.4 F (36.3 C)-98 F (36.7 C)] 97.8 F (36.6 C) (03/09 0733) Pulse Rate:  [81-104] 86 (03/09 1000) Resp:  [7-35] 35 (03/09 1000) BP: (97-129)/(48-109) 111/70 (03/09 1000) SpO2:  [93 %-99 %] 94 % (03/09 1000) Last BM Date: 12/22/16 General:   Alert, obese white female in NAD EENT:  Normal hearing, non icteric sclera, conjunctive pink.  Heart:  Regular rate and rhythm;  Pulm: Normal respiratory effort Abdomen:  Soft, protuberant, a few bowel sounds. .    Neurologic:  Alert and  oriented x4;  grossly normal neurologically. Psych:  Alert and cooperative. Normal mood and affect.   Intake/Output from previous day: 03/08 0701 - 03/09 0700 In: 5229 [I.V.:3773.3; Blood:1455.7] Out: 2500 [Urine:2000; Blood:500] Intake/Output this shift: Total I/O In: 400 [I.V.:400] Out: 250 [Urine:250]  Lab Results:  Recent Labs  12/25/16 0528 12/25/16 1825 12/26/16 0808  WBC 8.0 6.3 6.0  HGB 8.8* 9.1* 9.2*  HCT 26.3* 25.4* 26.2*  PLT 156 114* 124*   BMET  Recent Labs  12/24/16 1026 12/24/16 1745 12/25/16 0528 12/26/16 0808  NA 131*  --  136 136  K 3.2*  --  3.0* 3.8  CL 101  --  105 110  CO2 18*  --  21* 18*  GLUCOSE 110*  --  92 149*  BUN 11  --  10 9  CREATININE 0.85 0.87 0.77 0.60  CALCIUM 9.0  --  8.8* 8.1*   LFT  Recent Labs  12/26/16 0808  PROT 6.6  ALBUMIN 3.0*  AST 57*  ALT 18  ALKPHOS 121  BILITOT 2.2*   PT/INR  Recent Labs  12/25/16 1825 12/26/16 0808  LABPROT 18.6* 20.3*  INR 1.54 1.71   Hepatitis Panel  Recent Labs  12/25/16 0528  HEPBSAG Negative  HCVAB 0.2  HEPAIGM Negative  HEPBIGM Negative    Dg Cholangiogram Operative  Result Date: 12/25/2016 CLINICAL DATA:  46 year old female with a history  of cholelithiasis EXAM: INTRAOPERATIVE CHOLANGIOGRAM TECHNIQUE: Cholangiographic images from the C-arm fluoroscopic device were submitted for interpretation post-operatively. Please see the procedural report for the amount of contrast and the fluoroscopy time utilized. COMPARISON:  MR 12/24/2016 FINDINGS: Surgical instruments project over the upper abdomen. There is cannulation of the cystic duct/gallbladder neck, with antegrade infusion of contrast. Caliber of the extrahepatic ductal system within normal limits. No large filling defect identified. Free flow of contrast across the ampulla. IMPRESSION: Intraoperative cholangiogram demonstrates extrahepatic biliary ducts of unremarkable caliber, with no large filling defect identified. Free flow of contrast across the ampulla. Please refer to the dictated operative report for full details of intraoperative findings and procedure Electronically Signed   By: Corrie Mckusick D.O.   On: 12/25/2016 15:37   Mr 3d Recon At Scanner  Result Date: 12/25/2016 CLINICAL DATA:  Chronic nausea, vomiting common right abdominal pain with chronically abnormal LFTs. EXAM: MRI ABDOMEN WITHOUT AND WITH CONTRAST (INCLUDING MRCP) TECHNIQUE: Multiplanar multisequence MR imaging of the abdomen was performed both before and after the administration of intravenous contrast. Heavily T2-weighted images of the biliary and pancreatic ducts were obtained, and three-dimensional MRCP images were rendered by post processing. CONTRAST:  31mL MULTIHANCE GADOBENATE DIMEGLUMINE 529 MG/ML IV SOLN COMPARISON:  CT scan 12/14/2016 FINDINGS: Lower chest:  Unremarkable. Hepatobiliary:  Loss of signal within the hepatic parenchyma on out of phase T1 weighted imaging is compatible with fatty deposition. The liver remains enlarged at 21.5 cm craniocaudal length.Probable small volume sludge dependently in the gallbladder lumen without discrete stones evident. No gallbladder wall thickening or pericholecystic fluid.  No intra or extrahepatic biliary duct dilatation. Extrahepatic common duct measures 6 mm diameter. Common bile duct in the head of the pancreas is 5 mm diameter. Pancreas: No focal mass lesion. No dilatation of the main duct. No intraparenchymal cyst. No peripancreatic edema. Spleen: Spleen is 15.5 cm cranial caudal length. No focal mass lesion. Adrenals/Urinary Tract: No adrenal nodule or mass. Left kidney is atrophic with areas of focal cortical scarring. Right kidney unremarkable. Stomach/Bowel: Stomach is nondistended. No gastric wall thickening. No evidence of outlet obstruction. Duodenum is normally positioned as is the ligament of Treitz. No small bowel or colonic dilatation within the visualized abdomen. Vascular/Lymphatic: No abdominal aortic aneurysm. No abdominal lymphadenopathy. Other: No intraperitoneal free fluid. Musculoskeletal: No abnormal marrow enhancement within the visualized bony anatomy. IMPRESSION: 1. Hepatic steatosis with hepatomegaly. 2. No evidence of gallstones. No gallbladder wall thickening or pericholecystic fluid. No biliary ductal dilatation. No choledocholithiasis. 3. Spleen upper limits of normal for size to borderline enlarged. Electronically Signed   By: Misty Stanley M.D.   On: 12/25/2016 07:16   Assessment / Plan:  1. 46 yo female with chronic nausea, vomiting, right sided abdominal pain in setting of gallbladder sludge on imaging. She underwent lap chole yesterday with IOC (negative) yesterday. Hopefully her symptoms will resolve, if not she may need EGD.   2. Chronically abnormal LFTs, hepatosplenomegaly on imaging. She was consented for liver biopsy at time of lap chole but I'm not sure after reading op not that biopsy was done. The procedure was complicated by unexpected bleeding. Workup so far includes: Hepatitis panel negative. Ceruloplasm, Alpha 1 antrypysin and IgG all normal. Weakly positive ASMA in 2014. Repeating ASMA.   3. Chronic macrocytic anemia, may be  multifactorial but she is folate deficient. Replacement per primary team  Active Problems:   Symptomatic cholelithiasis   History of laparoscopic cholecystectomy    LOS: 1 day   Tye Savoy NP 12/26/2016, 12:17 PM  Pager number [phone]

## 2016-12-27 DIAGNOSIS — R932 Abnormal findings on diagnostic imaging of liver and biliary tract: Secondary | ICD-10-CM

## 2016-12-27 DIAGNOSIS — K8020 Calculus of gallbladder without cholecystitis without obstruction: Secondary | ICD-10-CM

## 2016-12-27 LAB — COMPREHENSIVE METABOLIC PANEL
ALK PHOS: 120 U/L (ref 38–126)
ALT: 21 U/L (ref 14–54)
AST: 70 U/L — AB (ref 15–41)
Albumin: 3.2 g/dL — ABNORMAL LOW (ref 3.5–5.0)
Anion gap: 8 (ref 5–15)
BUN: 11 mg/dL (ref 6–20)
CALCIUM: 8.2 mg/dL — AB (ref 8.9–10.3)
CHLORIDE: 109 mmol/L (ref 101–111)
CO2: 18 mmol/L — ABNORMAL LOW (ref 22–32)
Creatinine, Ser: 0.59 mg/dL (ref 0.44–1.00)
GFR calc Af Amer: >60 mL/min (ref >60)
Glucose, Bld: 119 mg/dL — ABNORMAL HIGH (ref 65–99)
Potassium: 3.8 mmol/L (ref 3.5–5.1)
Sodium: 135 mmol/L (ref 135–145)
Total Bilirubin: 2 mg/dL — ABNORMAL HIGH (ref 0.3–1.2)
Total Protein: 7.1 g/dL (ref 6.5–8.1)

## 2016-12-27 LAB — URINE CULTURE

## 2016-12-27 LAB — CBC
HEMATOCRIT: 26.4 % — AB (ref 36.0–46.0)
HEMOGLOBIN: 9.1 g/dL — AB (ref 12.0–15.0)
MCH: 33.2 pg (ref 26.0–34.0)
MCHC: 34.5 g/dL (ref 30.0–36.0)
MCV: 96.4 fL (ref 78.0–100.0)
Platelets: 145 10*3/uL — ABNORMAL LOW (ref 150–400)
RBC: 2.74 MIL/uL — ABNORMAL LOW (ref 3.87–5.11)
RDW: 22.8 % — ABNORMAL HIGH (ref 11.5–15.5)
WBC: 7.9 10*3/uL (ref 4.0–10.5)

## 2016-12-27 LAB — PROTIME-INR
INR: 2.46
Prothrombin Time: 27.1 seconds — ABNORMAL HIGH (ref 11.4–15.2)

## 2016-12-27 MED ORDER — BISACODYL 10 MG RE SUPP: 10.0000 mg | Freq: Every day | RECTAL | Status: DC | PRN

## 2016-12-27 MED ORDER — DOCUSATE SODIUM 100 MG PO CAPS
100.0000 mg | ORAL_CAPSULE | Freq: Two times a day (BID) | ORAL | Status: DC
  Administered 2016-12-27 – 2016-12-30 (×3): 100 mg via ORAL
  Filled 2016-12-27 (×5): qty 1

## 2016-12-27 MED ORDER — PANTOPRAZOLE SODIUM 40 MG PO TBEC
40.0000 mg | DELAYED_RELEASE_TABLET | Freq: Every day | ORAL | Status: DC
  Administered 2016-12-27 – 2016-12-30 (×4): 40 mg via ORAL
  Filled 2016-12-27 (×4): qty 1

## 2016-12-27 MED ORDER — FOLIC ACID 1 MG PO TABS
1.0000 mg | ORAL_TABLET | Freq: Every day | ORAL | Status: DC
  Administered 2016-12-27 – 2016-12-30 (×4): 1 mg via ORAL
  Filled 2016-12-27 (×4): qty 1

## 2016-12-27 NOTE — Progress Notes (Signed)
Daily Rounding Note  12/27/2016, 10:10 AM  LOS: 2 days   SUBJECTIVE:   Chief complaint: abdominal pain.  No n/v since yesterday.  No BM yet    OBJECTIVE:         Vital signs in last 24 hours:    Temp:  [97.5 F (36.4 C)-98.2 F (36.8 C)] 97.5 F (36.4 C) (03/10 0456) Pulse Rate:  [91-95] 91 (03/10 0456) Resp:  [18-30] 18 (03/10 0456) BP: (130-141)/(30-83) 141/83 (03/10 0456) SpO2:  [93 %-97 %] 96 % (03/10 0456) Last BM Date: 12/26/16 Filed Weights   12/24/16 1700  Weight: 66.7 kg (147 lb 1.6 oz)   General: looks unhealthy, obese, uncomfortable.  Moaning    Heart: RRR Chest: clear bil.  No labored resps Abdomen: distended, BS a bit tyypanitic.  Tender on right.  Bruising on right.   Extremities: no CCE Neuro/Psych:  Oriented x 3.  Moves all  4s.    Intake/Output from previous day: 03/09 0701 - 03/10 0700 In: 2500 [P.O.:150; I.V.:2300; IV Piggyback:50] Out: 1350 [Urine:1350]  Intake/Output this shift: Total I/O In: 0  Out: 300 [Urine:300]  Lab Results:  Recent Labs  12/25/16 1825 12/26/16 0808 12/27/16 0516  WBC 6.3 6.0 7.9  HGB 9.1* 9.2* 9.1*  HCT 25.4* 26.2* 26.4*  PLT 114* 124* 145*   BMET  Recent Labs  12/25/16 0528 12/26/16 0808 12/27/16 0516  NA 136 136 135  K 3.0* 3.8 3.8  CL 105 110 109  CO2 21* 18* 18*  GLUCOSE 92 149* 119*  BUN 10 9 11   CREATININE 0.77 0.60 0.59  CALCIUM 8.8* 8.1* 8.2*   LFT  Recent Labs  12/25/16 0528 12/26/16 0808 12/27/16 0516  PROT 7.6 6.6 7.1  ALBUMIN 3.1* 3.0* 3.2*  AST 51* 57* 70*  ALT 14 18 21   ALKPHOS 143* 121 120  BILITOT 2.0* 2.2* 2.0*   PT/INR  Recent Labs  12/26/16 0808 12/27/16 0516  LABPROT 20.3* 27.1*  INR 1.71 2.46   Hepatitis Panel  Recent Labs  12/25/16 0528  HEPBSAG Negative  HCVAB 0.2  HEPAIGM Negative  HEPBIGM Negative    Studies/Results: Dg Cholangiogram Operative  Result Date: 12/25/2016 CLINICAL DATA:   46 year old female with a history of cholelithiasis EXAM: INTRAOPERATIVE CHOLANGIOGRAM TECHNIQUE: Cholangiographic images from the C-arm fluoroscopic device were submitted for interpretation post-operatively. Please see the procedural report for the amount of contrast and the fluoroscopy time utilized. COMPARISON:  MR 12/24/2016 FINDINGS: Surgical instruments project over the upper abdomen. There is cannulation of the cystic duct/gallbladder neck, with antegrade infusion of contrast. Caliber of the extrahepatic ductal system within normal limits. No large filling defect identified. Free flow of contrast across the ampulla. IMPRESSION: Intraoperative cholangiogram demonstrates extrahepatic biliary ducts of unremarkable caliber, with no large filling defect identified. Free flow of contrast across the ampulla. Please refer to the dictated operative report for full details of intraoperative findings and procedure Electronically Signed   By: Corrie Mckusick D.O.   On: 12/25/2016 15:37   Scheduled Meds: . sodium chloride   Intravenous Once  . sodium chloride   Intravenous Once  . enoxaparin (LOVENOX) injection  40 mg Subcutaneous Q24H  . gabapentin  300 mg Oral QHS  . levothyroxine  75 mcg Oral QAC breakfast  . lip balm  1 application Topical BID  . pregabalin  50 mg Oral Daily  . saccharomyces boulardii  250 mg Oral BID   Continuous Infusions: . 0.9 %  NaCl with KCl 20 mEq / L 100 mL/hr at 12/27/16 0954   PRN Meds:.acetaminophen **OR** acetaminophen, bisacodyl, diphenhydrAMINE **OR** [DISCONTINUED] diphenhydrAMINE, diphenhydrAMINE, diphenhydrAMINE, hydrALAZINE, magic mouthwash, menthol-cetylpyridinium, methocarbamol (ROBAXIN)  IV, methocarbamol, metoCLOPramide (REGLAN) injection, metoCLOPramide, metoprolol, morphine injection, morphine injection **OR** morphine injection, ondansetron **OR** ondansetron (ZOFRAN) IV, phenazopyridine, phenol, prochlorperazine, promethazine, promethazine, simethicone,  traMADol   ASSESMENT:    *  Chronic RUQ pain, N/V, diarrhea:  For many years but worse in last several weeks. .  Sludge in GB.  S/p lap chole 3/8.  IOC negative. Path: mild, chronic cholecystitis.   Gastritis on EGD 01/2014. Path: reactive gastropathy, no H Pylori.   Hemorrhoids and mutiple small polypectomies (hyperplastic).  on Colonoscopy 01/2014. ileal biopsies negative for IBD, random colon  Biopsies negative.   Normal flex sig 11/2013 c diff Ag + 08/2015.    *  Elevated LFTs.  Steatosis.  Hepatosplenomegaly on preop CT.  Nodular/cirhottic looking liver at laparoscopy.   Liver biopsy not done due to intraop bleeding MRCP 3/8: Hepatomegaly, hepatic steatosis.  Spleen upper normal size.  Biliary ducts not dilated or ectatic.  IgG, IgG 4, Ceruloplasmin,  Alpha 1 AT: elevated to 351 but normal 3 years ago.  Ferritin elevated, chronically.   Smooth muscle Ab processing.  Historically has + ANA and SMA.    *  Macrocytic anemia.  Chronic.  Folate deficient per labs, dating back  3 years.  B12 normal.     PLAN   *  Started daily, oral folic acid.  Added daily PPI.      Jamie Allen  12/27/2016, 10:10 AM Pager: [phone]

## 2016-12-27 NOTE — Progress Notes (Signed)
2 Days Post-Op  Subjective: Still having pain but feeling better. Wants to have bm  Objective: Vital signs in last 24 hours: Temp:  [97.5 F (36.4 C)-98.2 F (36.8 C)] 97.5 F (36.4 C) (03/10 0456) Pulse Rate:  [91-95] 91 (03/10 0456) Resp:  [18-30] 18 (03/10 0456) BP: (130-141)/(30-83) 141/83 (03/10 0456) SpO2:  [93 %-97 %] 96 % (03/10 0456) Last BM Date: 12/26/16  Intake/Output from previous day: 03/09 0701 - 03/10 0700 In: 2500 [P.O.:150; I.V.:2300; IV Piggyback:50] Out: 1350 [Urine:1350] Intake/Output this shift: Total I/O In: 0  Out: 300 [Urine:300]  Resp: clear to auscultation bilaterally Cardio: regular rate and rhythm GI: soft, mild tenderness. distended but good bs  Lab Results:   Recent Labs  12/26/16 0808 12/27/16 0516  WBC 6.0 7.9  HGB 9.2* 9.1*  HCT 26.2* 26.4*  PLT 124* 145*   BMET  Recent Labs  12/26/16 0808 12/27/16 0516  NA 136 135  K 3.8 3.8  CL 110 109  CO2 18* 18*  GLUCOSE 149* 119*  BUN 9 11  CREATININE 0.60 0.59  CALCIUM 8.1* 8.2*   PT/INR  Recent Labs  12/26/16 0808 12/27/16 0516  LABPROT 20.3* 27.1*  INR 1.71 2.46   ABG No results for input(s): PHART, HCO3 in the last 72 hours.  Invalid input(s): PCO2, PO2  Studies/Results: Dg Cholangiogram Operative  Result Date: 12/25/2016 CLINICAL DATA:  46 year old female with a history of cholelithiasis EXAM: INTRAOPERATIVE CHOLANGIOGRAM TECHNIQUE: Cholangiographic images from the C-arm fluoroscopic device were submitted for interpretation post-operatively. Please see the procedural report for the amount of contrast and the fluoroscopy time utilized. COMPARISON:  MR 12/24/2016 FINDINGS: Surgical instruments project over the upper abdomen. There is cannulation of the cystic duct/gallbladder neck, with antegrade infusion of contrast. Caliber of the extrahepatic ductal system within normal limits. No large filling defect identified. Free flow of contrast across the ampulla. IMPRESSION:  Intraoperative cholangiogram demonstrates extrahepatic biliary ducts of unremarkable caliber, with no large filling defect identified. Free flow of contrast across the ampulla. Please refer to the dictated operative report for full details of intraoperative findings and procedure Electronically Signed   By: Corrie Mckusick D.O.   On: 12/25/2016 15:37    Anti-infectives: Anti-infectives    Start     Dose/Rate Route Frequency Ordered Stop   12/25/16 0830  ceFAZolin (ANCEF) IVPB 2g/100 mL premix     2 g 200 mL/hr over 30 Minutes Intravenous On call to O.R. 12/25/16 0746 12/25/16 1135      Assessment/Plan: s/p Procedure(s): LAPAROSCOPIC CHOLECYSTECTOMY WITH INTRAOPERATIVE CHOLANGIOGRAM (N/A) Advance diet. Allow fulls today Start stool softener and prn suppository Leave foley in till tomorrow for retention Hg stable  LOS: 2 days    TOTH III,PAUL S 12/27/2016

## 2016-12-28 NOTE — Progress Notes (Signed)
Patient ID: Jamie Allen, female   DOB: 07-22-1971, 46 y.o.   MRN: [id number] [facility] Progress Note:   3 Days Post-Op  Subjective: Mental status is fairly alert Objective: Vital signs in last 24 hours: Temp:  [97.7 F (36.5 C)-98 F (36.7 C)] 97.8 F (36.6 C) (03/11 0622) Pulse Rate:  [91-100] 91 (03/11 0622) Resp:  [18] 18 (03/11 0622) BP: (119-152)/(64-86) 119/64 (03/11 0622) SpO2:  [96 %-98 %] 97 % (03/11 0622)  Intake/Output from previous day: 03/10 0701 - 03/11 0700 In: 1541.7 [P.O.:420; I.V.:1121.7] Out: 1400 [Urine:1400] Intake/Output this shift: Total I/O In: 1798.3 [P.O.:120; I.V.:1678.3] Out: 300 [Urine:300]  Physical Exam: Work of breathing is not labored. Abdomen is protuberant and bruised especially on the left side at most lateral port.    Lab Results:  Results for orders placed or performed during the hospital encounter of 12/24/16 (from the past 48 hour(s))  CBC     Status: Abnormal   Collection Time: 12/27/16  5:16 AM  Result Value Ref Range   WBC 7.9 4.0 - 10.5 K/uL   RBC 2.74 (L) 3.87 - 5.11 MIL/uL   Hemoglobin 9.1 (L) 12.0 - 15.0 g/dL   HCT 26.4 (L) 36.0 - 46.0 %   MCV 96.4 78.0 - 100.0 fL   MCH 33.2 26.0 - 34.0 pg   MCHC 34.5 30.0 - 36.0 g/dL   RDW 22.8 (H) 11.5 - 15.5 %   Platelets 145 (L) 150 - 400 K/uL  Protime-INR     Status: Abnormal   Collection Time: 12/27/16  5:16 AM  Result Value Ref Range   Prothrombin Time 27.1 (H) 11.4 - 15.2 seconds   INR 2.46   Comprehensive metabolic panel     Status: Abnormal   Collection Time: 12/27/16  5:16 AM  Result Value Ref Range   Sodium 135 135 - 145 mmol/L   Potassium 3.8 3.5 - 5.1 mmol/L   Chloride 109 101 - 111 mmol/L   CO2 18 (L) 22 - 32 mmol/L   Glucose, Bld 119 (H) 65 - 99 mg/dL   BUN 11 6 - 20 mg/dL   Creatinine, Ser 0.59 0.44 - 1.00 mg/dL   Calcium 8.2 (L) 8.9 - 10.3 mg/dL   Total Protein 7.1 6.5 - 8.1 g/dL   Albumin 3.2 (L) 3.5 - 5.0 g/dL   AST 70 (H) 15 - 41 U/L    ALT 21 14 - 54 U/L   Alkaline Phosphatase 120 38 - 126 U/L   Total Bilirubin 2.0 (H) 0.3 - 1.2 mg/dL   GFR calc non Af Amer >60 >60 mL/min   GFR calc Af Amer >60 >60 mL/min    Comment: (NOTE) The eGFR has been calculated using the CKD EPI equation. This calculation has not been validated in all clinical situations. eGFR's persistently <60 mL/min signify possible Chronic Kidney Disease.    Anion gap 8 5 - 15    Radiology/Results: No results found.  Anti-infectives: Anti-infectives    Start     Dose/Rate Route Frequency Ordered Stop   12/25/16 0830  ceFAZolin (ANCEF) IVPB 2g/100 mL premix     2 g 200 mL/hr over 30 Minutes Intravenous On call to O.R. 12/25/16 0746 12/25/16 1135      Assessment/Plan: Problem List: Patient Active Problem List   Diagnosis Date Noted  . History of laparoscopic cholecystectomy 12/25/2016  . Symptomatic cholelithiasis 12/24/2016  . Postmenopausal syndrome 11/24/2016  . Urinary hesitancy 05/19/2016  . Right leg swelling 05/19/2016  .  Weight gain 05/19/2016  . Hypothyroidism 12/06/2015  . Bimalleolar fracture of right ankle 10/06/2015  . Foot fracture 10/06/2015  . Abnormality of gait 10/02/2015  . Paresthesia 10/02/2015  . Ankle fracture, left   . Neuropathy ([place])   . Weakness   . Intractable pain 09/23/2015  . Ankle fracture 09/23/2015  . Hepatic cirrhosis ([place])   . Vitamin D deficiency 08/28/2015  . Neuropathic pain, leg, bilateral 08/27/2015  . IBS (irritable bowel syndrome) 08/27/2015  . S/P alcohol detoxification 06/11/2014  . Alcohol dependence ([place]) 06/09/2014  . Nonalcoholic steatohepatitis ([facility]) 05/30/2014  . Unspecified constipation 05/30/2014  . Other and unspecified ovarian cyst 05/30/2014  . Anxiety and depression 04/19/2014  . Hypokalemia 04/19/2014  . C. difficile diarrhea 02/02/2014  . Cirrhosis of liver without mention of alcohol 07/21/2013    Taking some soft foods.  Foley out.  Slow progress.  3 Days Post-Op     LOS: 3 days   Matt B. Hassell Done, MD, [facility] [phone] beeper [phone]  12/28/2016 12:54 PM

## 2016-12-28 NOTE — Progress Notes (Signed)
Daily Rounding Note  12/28/2016, 8:59 AM  LOS: 3 days   SUBJECTIVE:   Chief complaint: constipation.  abd pain.  This is on right side.   Had 4 or so soft to liquid BMs, not watery or bloody, yesterday.   Walked in hall yesterday.      OBJECTIVE:         Vital signs in last 24 hours:    Temp:  [97.7 F (36.5 C)-98 F (36.7 C)] 97.8 F (36.6 C) (03/11 0622) Pulse Rate:  [91-100] 91 (03/11 0622) Resp:  [18] 18 (03/11 0622) BP: (119-152)/(64-86) 119/64 (03/11 0622) SpO2:  [96 %-98 %] 97 % (03/11 0622) Last BM Date: 12/26/16 Filed Weights   12/24/16 1700  Weight: 66.7 kg (147 lb 1.6 oz)   General: more comfortable but still in pain.  More alert   Heart: RRR Chest: clear bil.   Abdomen: less tense, still protuberant, distended.  Large bruise in LLQ and lesser bruising across lower abdomen.  Incisions CDI.  Tender without guarding or rebound.   BS active  Extremities: no CCE Neuro/Psych:  Oriented x 3.  Alert.  No gross deficits.  Still somewhat whiney.   Intake/Output from previous day: 03/10 0701 - 03/11 0700 In: 1541.7 [P.O.:420; I.V.:1121.7] Out: 1400 [Urine:1400]  Intake/Output this shift: No intake/output data recorded.  Lab Results:  Recent Labs  12/25/16 1825 12/26/16 0808 12/27/16 0516  WBC 6.3 6.0 7.9  HGB 9.1* 9.2* 9.1*  HCT 25.4* 26.2* 26.4*  PLT 114* 124* 145*   BMET  Recent Labs  12/26/16 0808 12/27/16 0516  NA 136 135  K 3.8 3.8  CL 110 109  CO2 18* 18*  GLUCOSE 149* 119*  BUN 9 11  CREATININE 0.60 0.59  CALCIUM 8.1* 8.2*   LFT  Recent Labs  12/26/16 0808 12/27/16 0516  PROT 6.6 7.1  ALBUMIN 3.0* 3.2*  AST 57* 70*  ALT 18 21  ALKPHOS 121 120  BILITOT 2.2* 2.0*   PT/INR  Recent Labs  12/26/16 0808 12/27/16 0516  LABPROT 20.3* 27.1*  INR 1.71 2.46   Hepatitis Panel No results for input(s): HEPBSAG, HCVAB, HEPAIGM, HEPBIGM in the last 72  hours.  Studies/Results: No results found.  Scheduled Meds: . docusate sodium  100 mg Oral BID  . enoxaparin (LOVENOX) injection  40 mg Subcutaneous Q24H  . folic acid  1 mg Oral Daily  . gabapentin  300 mg Oral QHS  . levothyroxine  75 mcg Oral QAC breakfast  . lip balm  1 application Topical BID  . pantoprazole  40 mg Oral Q0600  . pregabalin  50 mg Oral Daily  . saccharomyces boulardii  250 mg Oral BID   Continuous Infusions: . 0.9 % NaCl with KCl 20 mEq / L 100 mL/hr at 12/27/16 2009   PRN Meds:.acetaminophen **OR** acetaminophen, bisacodyl, diphenhydrAMINE, diphenhydrAMINE, hydrALAZINE, magic mouthwash, menthol-cetylpyridinium, methocarbamol (ROBAXIN)  IV, methocarbamol, metoCLOPramide, metoprolol, morphine injection **OR** morphine injection, ondansetron **OR** ondansetron (ZOFRAN) IV, phenazopyridine, phenol, prochlorperazine, promethazine, promethazine, simethicone, traMADol  ASSESMENT:   *  Chronic RUQ pain, N/V, diarrhea:  For many years but worse in last several weeks. .  Sludge in GB.  S/p lap chole 3/8.  IOC negative. Path: mild, chronic cholecystitis.  Post op pain: prn Ultram in place.   Gastritis on EGD 01/2014. Path: reactive gastropathy, no H Pylori.   on Protonix q day.   Hemorrhoids and mutiple small polypectomies (hyperplastic).  on  Colonoscopy 01/2014. ileal biopsies negative for IBD, random colon  Biopsies negative.   Normal flex sig 11/2013 c diff Ag + 08/2015.  On Florastor.   *  Elevated LFTs. Hepatosplenomegaly on preop CT.  Nodular/cirhottic looking liver at laparoscopy.   Liver biopsy not done due to intraop bleeding MRCP 3/8: Hepatomegaly, hepatic steatosis.  Spleen upper normal size.  Biliary ducts not dilated or ectatic.  IgG, IgG 4, Ceruloplasmin,  Alpha 1 AT: elevated to 351 but normal 3 years ago.  Ferritin elevated, chronically.   Smooth muscle Ab processing.  Historically has + ANA and SMA.    *  Macrocytic anemia.  Chronic.  Folate  deficient per labs, dating back  3 years.  T59 normal.  Folic acid added 7/41.      PLAN   *  Note that Dr Georgette Dover added stool softeners, if she continues to have multiple loose stools today, may not need these.     Azucena Freed  12/28/2016, 8:59 AM Pager: [phone]

## 2016-12-29 LAB — TYPE AND SCREEN
ABO/RH(D): O NEG
ANTIBODY SCREEN: POSITIVE
DAT, IgG: NEGATIVE
PT AG Type: NEGATIVE
UNIT DIVISION: 0
UNIT DIVISION: 0
Unit division: 0
Unit division: 0

## 2016-12-29 LAB — BASIC METABOLIC PANEL
Anion gap: 6 (ref 5–15)
CALCIUM: 8.5 mg/dL — AB (ref 8.9–10.3)
CO2: 20 mmol/L — ABNORMAL LOW (ref 22–32)
CREATININE: 0.68 mg/dL (ref 0.44–1.00)
Chloride: 107 mmol/L (ref 101–111)
GFR calc Af Amer: >60 mL/min (ref >60)
GFR calc non Af Amer: >60 mL/min (ref >60)
GLUCOSE: 92 mg/dL (ref 65–99)
Potassium: 3.6 mmol/L (ref 3.5–5.1)
SODIUM: 133 mmol/L — AB (ref 135–145)

## 2016-12-29 LAB — CBC
HCT: 27 % — ABNORMAL LOW (ref 36.0–46.0)
Hemoglobin: 9.3 g/dL — ABNORMAL LOW (ref 12.0–15.0)
MCH: 33.3 pg (ref 26.0–34.0)
MCHC: 34.4 g/dL (ref 30.0–36.0)
MCV: 96.8 fL (ref 78.0–100.0)
PLATELETS: 130 10*3/uL — AB (ref 150–400)
RBC: 2.79 MIL/uL — ABNORMAL LOW (ref 3.87–5.11)
RDW: 21.4 % — AB (ref 11.5–15.5)
WBC: 6.9 10*3/uL (ref 4.0–10.5)

## 2016-12-29 LAB — BPAM RBC
BLOOD PRODUCT EXPIRATION DATE: 201803292359
Blood Product Expiration Date: 201803192359
Blood Product Expiration Date: 201803282359
Blood Product Expiration Date: 201803312359
ISSUE DATE / TIME: 201803081324
ISSUE DATE / TIME: 201803081324
UNIT TYPE AND RH: 9500
Unit Type and Rh: 9500
Unit Type and Rh: 9500
Unit Type and Rh: 9500

## 2016-12-29 MED ORDER — NAPROXEN 500 MG PO TABS
500.0000 mg | ORAL_TABLET | Freq: Two times a day (BID) | ORAL | Status: DC
  Administered 2016-12-29 – 2016-12-30 (×3): 500 mg via ORAL
  Filled 2016-12-29 (×3): qty 1

## 2016-12-29 MED ORDER — TRAMADOL HCL 50 MG PO TABS
50.0000 mg | ORAL_TABLET | Freq: Four times a day (QID) | ORAL | Status: DC | PRN
  Administered 2016-12-30: 100 mg via ORAL
  Filled 2016-12-29: qty 2

## 2016-12-29 MED ORDER — MORPHINE SULFATE (PF) 4 MG/ML IV SOLN
1.0000 mg | Freq: Four times a day (QID) | INTRAVENOUS | Status: DC | PRN
  Administered 2016-12-30: 2 mg via INTRAVENOUS
  Filled 2016-12-29: qty 1

## 2016-12-29 MED ORDER — METHOCARBAMOL 500 MG PO TABS
1000.0000 mg | ORAL_TABLET | Freq: Three times a day (TID) | ORAL | Status: DC
  Administered 2016-12-29 – 2016-12-30 (×5): 1000 mg via ORAL
  Filled 2016-12-29 (×5): qty 2

## 2016-12-29 NOTE — Progress Notes (Signed)
Patient ID: Jamie Allen, female   DOB: 01/25/1971, 46 y.o.   MRN: [id number]  [facility] Progress Note  4 Days Post-Op  Subjective: Patient reports persistent diffuse abdominal pain. She did have a BM yesterday and feels less distended. Passing flatus. Not ambulating very much. Tolerating full liquids. Denies n/v. Denies dysuria or urinary frequency.  Objective: Vital signs in last 24 hours: Temp:  [98.2 F (36.8 C)-98.6 F (37 C)] 98.2 F (36.8 C) (03/12 0500) Pulse Rate:  [98-102] 98 (03/12 0500) Resp:  [18-20] 18 (03/12 0500) BP: (115-121)/(65-75) 121/65 (03/12 0500) SpO2:  [95 %-98 %] 98 % (03/12 0500) Last BM Date: 12/28/16  Intake/Output from previous day: 03/11 0701 - 03/12 0700 In: 3878.3 [P.O.:600; I.V.:3278.3] Out: 1250 [Urine:1250] Intake/Output this shift: Total I/O In: 120 [P.O.:120] Out: 0   PE: Gen:  Alert, NAD, pleasant Card:  RRR, no M/G/R heard Pulm:  CTAB, no W/R/R, effort normal Abd: Soft, distended but improved from last week, +BS, multiple lap incisions C/D/I, appropriately tender Ext:  No erythema, edema, or tenderness   Lab Results:   Recent Labs  12/27/16 0516  WBC 7.9  HGB 9.1*  HCT 26.4*  PLT 145*   BMET  Recent Labs  12/27/16 0516  NA 135  K 3.8  CL 109  CO2 18*  GLUCOSE 119*  BUN 11  CREATININE 0.59  CALCIUM 8.2*   PT/INR  Recent Labs  12/27/16 0516  LABPROT 27.1*  INR 2.46   CMP     Component Value Date/Time   NA 135 12/27/2016 0516   K 3.8 12/27/2016 0516   CL 109 12/27/2016 0516   CO2 18 (L) 12/27/2016 0516   GLUCOSE 119 (H) 12/27/2016 0516   BUN 11 12/27/2016 0516   CREATININE 0.59 12/27/2016 0516   CREATININE 1.01 05/19/2016 1639   CALCIUM 8.2 (L) 12/27/2016 0516   PROT 7.1 12/27/2016 0516   PROT 6.7 10/02/2015 1455   ALBUMIN 3.2 (L) 12/27/2016 0516   AST 70 (H) 12/27/2016 0516   ALT 21 12/27/2016 0516   ALKPHOS 120 12/27/2016 0516   BILITOT 2.0 (H) 12/27/2016 0516   GFRNONAA >60 12/27/2016 0516   GFRNONAA 67 05/19/2016 1639   GFRAA >60 12/27/2016 0516   GFRAA 78 05/19/2016 1639   Lipase     Component Value Date/Time   LIPASE 32 12/24/2016 1026       Studies/Results: No results found.  Anti-infectives: Anti-infectives    Start     Dose/Rate Route Frequency Ordered Stop   12/25/16 0830  ceFAZolin (ANCEF) IVPB 2g/100 mL premix     2 g 200 mL/hr over 30 Minutes Intravenous On call to O.R. 12/25/16 0746 12/25/16 1135       Assessment/Plan Billiary cholic S/p LAPAROSCOPIC CHOLECYSTECTOMY WITH INTRAOPERATIVE CHOLANGIOGRAM (N/A) 3/8 Dr. Marlou Starks - POD 4 - intraoperatively/immediate postop patient required 4u FFP and 2 uPRBC - Hg has remained stable, will repeat labs today  Chronically abnormal LFTs, hepatosplenomegaly on imaging - unable to perform liver biopsy at time of surgery due to unexpected bleeding - Workup so far includes: Hepatitis panel negative. Ceruloplasm, Alpha 1 antrypysin and IgG all normal. Weakly positive ASMA in 2014. Repeat ASMA pending. - patient to follow-up with Armbruster MD after discharge   Chronic macrocytic anemia- may need to start supplemental iron/folate once taking PO Dysuria - culture grew E coli, patient now asymptomatic  Neuropathy - continue home gabapentin and lyrica H/o c diff - probiotic Uterine fibroids Hypothyroid - synthroid  Tobacco abuse  ID - ancef perioperative FEN - regular diet VTE - SCDs  Plan - CBC/BMP pending. Advance to regular diet. Encourage patient to ambulate more. Decrease morphine use only for breakthrough pain; use tramadol PRN; schedule robaxin. Depending on lab results may add toradol for additional pain control.   LOS: 4 days    Jerrye Beavers , [facility] 12/29/2016, 10:55 AM Pager: [phone] Consults: [phone] Mon-Fri 7:00 am-4:30 pm Sat-Sun 7:00 am-11:30 am

## 2016-12-30 LAB — ANTI-SMOOTH MUSCLE ANTIBODY, IGG: F-Actin IgG: 17 Units (ref 0–19)

## 2016-12-30 MED ORDER — DOCUSATE SODIUM 100 MG PO CAPS: 100.0000 mg | ORAL_CAPSULE | Freq: Two times a day (BID) | ORAL | 0 refills | Status: DC

## 2016-12-30 MED ORDER — METHOCARBAMOL 500 MG PO TABS: 1000.0000 mg | ORAL_TABLET | Freq: Every evening | ORAL | 0 refills | Status: DC | PRN

## 2016-12-30 MED ORDER — SACCHAROMYCES BOULARDII 250 MG PO CAPS: 250.0000 mg | ORAL_CAPSULE | Freq: Two times a day (BID) | ORAL | Status: DC

## 2016-12-30 MED ORDER — OXYCODONE HCL 5 MG PO TABS
5.0000 mg | ORAL_TABLET | Freq: Four times a day (QID) | ORAL | Status: DC | PRN
Start: 2016-12-30 — End: 2016-12-30
  Administered 2016-12-30: 5 mg via ORAL
  Filled 2016-12-30: qty 1

## 2016-12-30 MED ORDER — OXYCODONE HCL 5 MG PO TABS: 5.0000 mg | ORAL_TABLET | Freq: Four times a day (QID) | ORAL | 0 refills | Status: DC | PRN

## 2016-12-30 MED ORDER — ONDANSETRON 4 MG PO TBDP: 4.0000 mg | ORAL_TABLET | Freq: Four times a day (QID) | ORAL | 0 refills | Status: DC | PRN

## 2016-12-30 NOTE — Discharge Instructions (Signed)
LAPAROSCOPIC SURGERY: POST OP INSTRUCTIONS  1. DIET: Follow a light bland diet the first 24 hours after arrival home, such as soup, liquids, crackers, etc. Be sure to include lots of fluids daily. Avoid fast food or heavy meals as your are more likely to get nauseated. Eat a low fat the next few days after surgery.  2. Take your usually prescribed home medications unless otherwise directed. 3. PAIN CONTROL:  1. Pain is best controlled by a usual combination of three different methods TOGETHER:  1. Ice/Heat 2. Over the counter pain medication 3. Prescription pain medication 2. Most patients will experience some swelling and bruising around the incisions. Ice packs or heating pads (30-60 minutes up to 6 times a day) will help. Use ice for the first few days to help decrease swelling and bruising, then switch to heat to help relax tight/sore spots and speed recovery. Some people prefer to use ice alone, heat alone, alternating between ice & heat. Experiment to what works for you. Swelling and bruising can take several weeks to resolve.  3. It is helpful to take an over-the-counter pain medication regularly for the first few weeks. Choose one of the following that works best for you:  1. Naproxen (Aleve, etc) Two 232m tabs twice a day 2. Ibuprofen (Advil, etc) Three 2017mtabs four times a day (every meal & bedtime) 3. Acetaminophen (Tylenol, etc) 500-65084mour times a day (every meal & bedtime) 4. A prescription for pain medication (such as oxycodone, hydrocodone, etc) should be given to you upon discharge. Take your pain medication as prescribed.  1. If you are having problems/concerns with the prescription medicine (does not control pain, nausea, vomiting, rash, itching, etc), please call us [name] see if we need to switch you to a different pain medicine that will work better for you and/or control your side effect better. 2. If you need a refill on your pain medication, please contact  your pharmacy. They will contact our office to request authorization. Prescriptions will not be filled after 5 pm or on week-ends. 4. Avoid getting constipated. Between the surgery and the pain medications, it is common to experience some constipation. Increasing fluid intake and taking a fiber supplement (such as Metamucil, Citrucel, FiberCon, MiraLax, etc) 1-2 times a day regularly will usually help prevent this problem from occurring. A mild laxative (prune juice, Milk of Magnesia, MiraLax, etc) should be taken according to package directions if there are no bowel movements after 48 hours.  5. Watch out for diarrhea. If you have many loose bowel movements, simplify your diet to bland foods & liquids for a few days. Stop any stool softeners and decrease your fiber supplement. Switching to mild anti-diarrheal medications (Kayopectate, Pepto Bismol) can help. If this worsens or does not improve, please call us.[name]. Wash / shower every day. You may shower over the dressings as they are waterproof. Continue to shower over incision(s) after the dressing is off. If there is glue over the incisions try not to pick it off, let it fall off naturally. 7. Remove your waterproof bandages 5 days after surgery. You may leave the incision open to air. You may replace a dressing/Band-Aid to cover the incision for comfort if you wish.  8. ACTIVITIES as tolerated:  1. You may resume regular (light) daily activities beginning the next day--such as daily self-care, walking, climbing stairs--gradually increasing activities as tolerated. If you can walk 30 minutes without difficulty, it is safe to try more intense activity  such as jogging, treadmill, bicycling, low-impact aerobics, swimming, etc. 2. Save the most intensive and strenuous activity for last such as sit-ups, heavy lifting, contact sports, etc Refrain from any heavy lifting or straining until you are off narcotics for pain control. For the first 2-3 weeks do not lift  over 10-15lb.  3. DO NOT PUSH THROUGH PAIN. Let pain be your guide: If it hurts to do something, don't do it. Pain is your body warning you to avoid that activity for another week until the pain goes down. 4. You may drive when you are no longer taking prescription pain medication, you can comfortably wear a seatbelt, and you can safely maneuver your car and apply brakes. 5. You may have sexual intercourse when it is comfortable.  9. FOLLOW UP in our office  1. Please call [facility] at [phone] to set up an appointment to see your surgeon in the office for a follow-up appointment approximately 2-3 weeks after your surgery. 2. Make sure that you call for this appointment the day you arrive home to insure a convenient appointment time.      10. IF YOU HAVE DISABILITY OR FAMILY LEAVE FORMS, BRING THEM TO THE               OFFICE FOR PROCESSING.   WHEN TO CALL us [phone]:  1. Poor pain control 2. Reactions / problems with new medications (rash/itching, nausea, etc)  3. Fever over 101.5 F (38.5 C) 4. Inability to urinate 5. Nausea and/or vomiting 6. Worsening swelling or bruising 7. Continued bleeding from incision. 8. Increased pain, redness, or drainage from the incision  The clinic staff is available to answer your questions during regular business hours (8:30am-5pm). Please dont hesitate to call and ask to speak to one of our nurses for clinical concerns.  If you have a medical emergency, go to the nearest emergency room or call 911.  A surgeon from [facility] is always on call at the [facility], [place], [place], [place], [place] [address] ?  MAIN: [phone] ? TOLL FREE: [phone] ?  FAX [phone]  [email or web address]

## 2016-12-30 NOTE — Discharge Summary (Signed)
[facility] Discharge Summary   Patient ID: Jamie Allen MRN: [id number] DOB/AGE: 1971/09/10 46 y.o.  Admit date: 12/24/2016 Discharge date: 12/30/2016  Admitting Diagnosis: Symptomatic cholelithiasis  Discharge Diagnosis Patient Active Problem List   Diagnosis Date Noted  . History of laparoscopic cholecystectomy 12/25/2016  . Symptomatic cholelithiasis 12/24/2016  . Postmenopausal syndrome 11/24/2016  . Urinary hesitancy 05/19/2016  . Right leg swelling 05/19/2016  . Weight gain 05/19/2016  . Hypothyroidism 12/06/2015  . Bimalleolar fracture of right ankle 10/06/2015  . Foot fracture 10/06/2015  . Abnormality of gait 10/02/2015  . Paresthesia 10/02/2015  . Ankle fracture, left   . Neuropathy ([place])   . Weakness   . Intractable pain 09/23/2015  . Ankle fracture 09/23/2015  . Hepatic cirrhosis ([place])   . Vitamin D deficiency 08/28/2015  . Neuropathic pain, leg, bilateral 08/27/2015  . IBS (irritable bowel syndrome) 08/27/2015  . S/P alcohol detoxification 06/11/2014  . Alcohol dependence ([place]) 06/09/2014  . Nonalcoholic steatohepatitis ([facility]) 05/30/2014  . Unspecified constipation 05/30/2014  . Other and unspecified ovarian cyst 05/30/2014  . Anxiety and depression 04/19/2014  . Hypokalemia 04/19/2014  . C. difficile diarrhea 02/02/2014  . Cirrhosis of liver without mention of alcohol 07/21/2013    Consultants Toad Hop Cellar MD - GI  Imaging: MRCP 12/24/16: 1. Hepatic steatosis with hepatomegaly. 2. No evidence of gallstones. No gallbladder wall thickening or pericholecystic fluid. No biliary ductal dilatation. No choledocholithiasis. 3. Spleen upper limits of normal for size to borderline enlarged.  [place] 12/25/16: Intraoperative cholangiogram demonstrates extrahepatic biliary ducts of unremarkable caliber, with no large filling defect identified. Free flow of contrast across the ampulla.  Procedures Dr. Marlou Starks (12/25/16) - Laparoscopic  Cholecystectomy with [place] Hospital Course:  Jamie Allen is a 47yo female who presented to [facility] 12/24/16 with 2 weeks of worsening diffuse abdominal pain. This was her 3rd visit to the ED in 2 weeks. States that she has had intermittent right sided abdominal pain for years, and has been told in the past that it was her gallbladder. She had an abdominal u/s 12/20/16 which showed sludge but no definite cholecystitis. She also has a known history of uterine fibroids, which may be accounting for some of her lower abdominal pain. After long discussion that cholecystectomy would likely help some but not all of her abdominal pain, patient was admitted and underwent procedure listed above.  Intraoperatively she had some significant bleeding requiring 4 units FFP and 2 uPRBC. Patient was admitted to the ICU for observation. Postoperatively her hemoglobin, platelets, and INR remained stable. She did have a postop ileus as expected, which resolved. She was transferred to the floor and diet was advanced as tolerated. On POD5 the patient was voiding well, tolerating diet, ambulating well, pain well controlled, vital signs stable, incisions c/d/i and felt stable for discharge home.  Patient will follow up in our office in 2 weeks and knows to call with questions or concerns.  She will also follow-up with Dr. Havery Moros for further workup of possible cirrhosis, and with her PCP regarding uterine fibroids.  I have personally reviewed the patients medication history on the [place] controlled substance database.   Physical Exam: Gen: Alert, NAD, pleasant Card: RRR, no M/G/R heard Pulm: CTAB, no W/R/R, effort normal Abd: Soft, distended but improved from last week, +BS, multiple lap incisions C/D/I, appropriately tender Ext: No erythema, edema, or tenderness   Allergies as of 12/30/2016      Reactions   Iohexol Hives, Itching, Swelling  Morphine And Related Other (See Comments)   Unknown, patient is unaware of  allergy Can tolerate with Benadryl   Oxycodone Itching, Other (See Comments)   Can tolerate vicodin      Medication List    STOP taking these medications   HYDROcodone-acetaminophen 5-325 MG tablet Commonly known as:  NORCO/VICODIN   promethazine 25 MG tablet Commonly known as:  PHENERGAN   traMADol 50 MG tablet Commonly known as:  ULTRAM     TAKE these medications   diazepam 5 MG tablet Commonly known as:  VALIUM Take 1 tablet (5 mg total) by mouth every 12 (twelve) hours as needed for anxiety. What changed:  when to take this   docusate sodium 100 MG capsule Commonly known as:  COLACE Take 1 capsule (100 mg total) by mouth 2 (two) times daily. You can get a stool softener over the counter. Recommend taking while you are taking narcotics.   estradiol 1 MG tablet Commonly known as:  ESTRACE Take 1 tablet (1 mg total) by mouth daily.   gabapentin 300 MG capsule Commonly known as:  NEURONTIN Take 3 capsules (900 mg total) by mouth 3 (three) times daily. What changed:  how much to take  when to take this   levothyroxine 75 MCG tablet Commonly known as:  SYNTHROID, LEVOTHROID Take 1 tablet (75 mcg total) by mouth daily before breakfast.   medroxyPROGESTERone 2.5 MG tablet Commonly known as:  PROVERA Take 1 tablet (2.5 mg total) by mouth daily.   methocarbamol 500 MG tablet Commonly known as:  ROBAXIN Take 2 tablets (1,000 mg total) by mouth at bedtime as needed for muscle spasms.   naproxen 500 MG tablet Commonly known as:  NAPROSYN Take 1 tablet (500 mg total) by mouth 2 (two) times daily with a meal. What changed:  when to take this  reasons to take this   ondansetron 4 MG disintegrating tablet Commonly known as:  ZOFRAN-ODT Take 1 tablet (4 mg total) by mouth every 6 (six) hours as needed for nausea.   oxyCODONE 5 MG immediate release tablet Commonly known as:  Oxy IR/ROXICODONE Take 1 tablet (5 mg total) by mouth every 6 (six) hours as needed for  moderate pain or severe pain.   polyethylene glycol powder powder Commonly known as:  GLYCOLAX/MIRALAX Add 6 cups of Miralax powder into 32 ounces of Gatorade and drink once. Then add 3 cups in 16 ounces of Gatorade and drink twice a day for 3 days.   Potassium Chloride ER 20 MEQ Tbcr Take 20 mEq by mouth daily.   pregabalin 50 MG capsule Commonly known as:  LYRICA Take 1 capsule (50 mg total) by mouth 3 (three) times daily. For PASS What changed:  when to take this  additional instructions   saccharomyces boulardii 250 MG capsule Commonly known as:  FLORASTOR Take 1 capsule (250 mg total) by mouth 2 (two) times daily. You can get a probiotic over the counter.        Follow-up Information    Merrie Roof, MD. Go on 01/14/2017.   Specialty:  General Surgery Why:  You appointment is 01/14/2017 at 10:00 am. Please arrive 30 minutes prior to your appointment to check in and fill out necessary paperwork. Contact information: [address] [place] [address] [phone]        Manus Gunning, MD. Call.   Specialty:  Gastroenterology Why:  For follow-up of liver evaluation Contact information: [address]  [address] [place] [address] [phone]        Minerva Ends, MD. Call.   Specialty:  Family Medicine Why:  For follow-up of your uterine fibroids Contact information: [address]  [place] [address] [phone]           Signed: Jerrye Beavers, [facility] Surgery 12/30/2016, 10:31 AM Pager: [phone] Consults: [phone] Mon-Fri 7:00 am-4:30 pm Sat-Sun 7:00 am-11:30 am

## 2016-12-30 NOTE — Progress Notes (Signed)
Assessment unchanged. Pt verbalized understanding of dc instructions through teach back including medications, follow up care, and when to call the doctor. Scripts x 3 given as provided by MD. Discharged via wc to front entrance accompanied by NT and husband.

## 2017-01-02 ENCOUNTER — Inpatient Hospital Stay (HOSPITAL_COMMUNITY)
Admission: EM | Admit: 2017-01-02 | Discharge: 2017-01-06 | DRG: 603 | Disposition: A | Discharge status: Home / Self Care | Payer: Self-pay | Attending: Internal Medicine | Admitting: Internal Medicine

## 2017-01-02 ENCOUNTER — Encounter (HOSPITAL_COMMUNITY): Payer: Self-pay

## 2017-01-02 DIAGNOSIS — D638 Anemia in other chronic diseases classified elsewhere: Secondary | ICD-10-CM | POA: Diagnosis present

## 2017-01-02 DIAGNOSIS — D259 Leiomyoma of uterus, unspecified: Secondary | ICD-10-CM | POA: Diagnosis present

## 2017-01-02 DIAGNOSIS — I10 Essential (primary) hypertension: Secondary | ICD-10-CM | POA: Diagnosis present

## 2017-01-02 DIAGNOSIS — Z79899 Other long term (current) drug therapy: Secondary | ICD-10-CM

## 2017-01-02 DIAGNOSIS — Z6833 Body mass index (BMI) 33.0-33.9, adult: Secondary | ICD-10-CM

## 2017-01-02 DIAGNOSIS — I959 Hypotension, unspecified: Secondary | ICD-10-CM | POA: Diagnosis present

## 2017-01-02 DIAGNOSIS — G5793 Unspecified mononeuropathy of bilateral lower limbs: Secondary | ICD-10-CM | POA: Diagnosis present

## 2017-01-02 DIAGNOSIS — K8010 Calculus of gallbladder with chronic cholecystitis without obstruction: Secondary | ICD-10-CM | POA: Diagnosis present

## 2017-01-02 DIAGNOSIS — F419 Anxiety disorder, unspecified: Secondary | ICD-10-CM

## 2017-01-02 DIAGNOSIS — Z9889 Other specified postprocedural states: Secondary | ICD-10-CM

## 2017-01-02 DIAGNOSIS — F329 Major depressive disorder, single episode, unspecified: Secondary | ICD-10-CM | POA: Diagnosis present

## 2017-01-02 DIAGNOSIS — K219 Gastro-esophageal reflux disease without esophagitis: Secondary | ICD-10-CM | POA: Diagnosis present

## 2017-01-02 DIAGNOSIS — E669 Obesity, unspecified: Secondary | ICD-10-CM | POA: Diagnosis present

## 2017-01-02 DIAGNOSIS — E876 Hypokalemia: Secondary | ICD-10-CM | POA: Diagnosis present

## 2017-01-02 DIAGNOSIS — F1020 Alcohol dependence, uncomplicated: Secondary | ICD-10-CM | POA: Diagnosis present

## 2017-01-02 DIAGNOSIS — Z888 Allergy status to other drugs, medicaments and biological substances status: Secondary | ICD-10-CM

## 2017-01-02 DIAGNOSIS — R162 Hepatomegaly with splenomegaly, not elsewhere classified: Secondary | ICD-10-CM | POA: Diagnosis present

## 2017-01-02 DIAGNOSIS — R109 Unspecified abdominal pain: Secondary | ICD-10-CM

## 2017-01-02 DIAGNOSIS — F32A Depression, unspecified: Secondary | ICD-10-CM | POA: Diagnosis present

## 2017-01-02 DIAGNOSIS — L03311 Cellulitis of abdominal wall: Principal | ICD-10-CM | POA: Diagnosis present

## 2017-01-02 DIAGNOSIS — E039 Hypothyroidism, unspecified: Secondary | ICD-10-CM | POA: Diagnosis present

## 2017-01-02 DIAGNOSIS — Z885 Allergy status to narcotic agent status: Secondary | ICD-10-CM

## 2017-01-02 DIAGNOSIS — K7030 Alcoholic cirrhosis of liver without ascites: Secondary | ICD-10-CM | POA: Diagnosis present

## 2017-01-02 DIAGNOSIS — Z9049 Acquired absence of other specified parts of digestive tract: Secondary | ICD-10-CM

## 2017-01-02 DIAGNOSIS — G629 Polyneuropathy, unspecified: Secondary | ICD-10-CM | POA: Diagnosis present

## 2017-01-02 DIAGNOSIS — K7460 Unspecified cirrhosis of liver: Secondary | ICD-10-CM | POA: Diagnosis present

## 2017-01-02 DIAGNOSIS — F4310 Post-traumatic stress disorder, unspecified: Secondary | ICD-10-CM | POA: Diagnosis present

## 2017-01-02 HISTORY — DX: Enterocolitis due to Clostridium difficile, not specified as recurrent: A04.72

## 2017-01-02 HISTORY — DX: Calculus of gallbladder with chronic cholecystitis without obstruction: K80.10

## 2017-01-02 HISTORY — DX: Unspecified chronic gastritis without bleeding: K29.50

## 2017-01-02 LAB — COMPREHENSIVE METABOLIC PANEL
ALBUMIN: 3.1 g/dL — AB (ref 3.5–5.0)
ALK PHOS: 165 U/L — AB (ref 38–126)
ALT: 16 U/L (ref 14–54)
ANION GAP: 13 (ref 5–15)
AST: 50 U/L — AB (ref 15–41)
BUN: 7 mg/dL (ref 6–20)
CHLORIDE: 103 mmol/L (ref 101–111)
CO2: 20 mmol/L — AB (ref 22–32)
Calcium: 9.2 mg/dL (ref 8.9–10.3)
Creatinine, Ser: 0.79 mg/dL (ref 0.44–1.00)
GFR calc Af Amer: >60 mL/min (ref >60)
GFR calc non Af Amer: >60 mL/min (ref >60)
GLUCOSE: 95 mg/dL (ref 65–99)
POTASSIUM: 2.9 mmol/L — AB (ref 3.5–5.1)
SODIUM: 136 mmol/L (ref 135–145)
Total Bilirubin: 2 mg/dL — ABNORMAL HIGH (ref 0.3–1.2)
Total Protein: 6.9 g/dL (ref 6.5–8.1)

## 2017-01-02 LAB — CBC WITH DIFFERENTIAL/PLATELET
BASOS ABS: 0 10*3/uL (ref 0.0–0.1)
BASOS PCT: 0 %
EOS ABS: 0.1 10*3/uL (ref 0.0–0.7)
Eosinophils Relative: 1 %
HCT: 24.7 % — ABNORMAL LOW (ref 36.0–46.0)
Hemoglobin: 8.8 g/dL — ABNORMAL LOW (ref 12.0–15.0)
LYMPHS PCT: 16 %
Lymphs Abs: 1.7 10*3/uL (ref 0.7–4.0)
MCH: 34.1 pg — AB (ref 26.0–34.0)
MCHC: 35.6 g/dL (ref 30.0–36.0)
MCV: 95.7 fL (ref 78.0–100.0)
Monocytes Absolute: 0.8 10*3/uL (ref 0.1–1.0)
Monocytes Relative: 7 %
NEUTROS PCT: 76 %
Neutro Abs: 8.2 10*3/uL — ABNORMAL HIGH (ref 1.7–7.7)
PLATELETS: 117 10*3/uL — AB (ref 150–400)
RBC: 2.58 MIL/uL — AB (ref 3.87–5.11)
RDW: 21.1 % — ABNORMAL HIGH (ref 11.5–15.5)
WBC: 10.8 10*3/uL — AB (ref 4.0–10.5)

## 2017-01-02 LAB — I-STAT CG4 LACTIC ACID, ED: Lactic Acid, Venous: 1.62 mmol/L (ref 0.5–1.9)

## 2017-01-02 LAB — BRAIN NATRIURETIC PEPTIDE: B Natriuretic Peptide: 84 pg/mL (ref 0.0–100.0)

## 2017-01-02 LAB — LIPASE, BLOOD: Lipase: 56 U/L — ABNORMAL HIGH (ref 11–51)

## 2017-01-02 MED ORDER — HYDROMORPHONE HCL 1 MG/ML IJ SOLN
1.0000 mg | Freq: Once | INTRAMUSCULAR | Status: AC
  Administered 2017-01-02: 1 mg via INTRAVENOUS
  Filled 2017-01-02: qty 1

## 2017-01-02 MED ORDER — FENTANYL CITRATE (PF) 100 MCG/2ML IJ SOLN
75.0000 ug | Freq: Once | INTRAMUSCULAR | Status: AC
  Administered 2017-01-02: 75 ug via INTRAVENOUS
  Filled 2017-01-02: qty 2

## 2017-01-02 MED ORDER — SODIUM CHLORIDE 0.9 % IV BOLUS (SEPSIS)
1000.0000 mL | Freq: Once | INTRAVENOUS | Status: AC
  Administered 2017-01-02: 1000 mL via INTRAVENOUS

## 2017-01-02 MED ORDER — PIPERACILLIN-TAZOBACTAM 3.375 G IVPB
3.3750 g | Freq: Once | INTRAVENOUS | Status: AC
  Administered 2017-01-02: 3.375 g via INTRAVENOUS
  Filled 2017-01-02: qty 50

## 2017-01-02 MED ORDER — PIPERACILLIN-TAZOBACTAM 3.375 G IVPB 30 MIN: 3.3750 g | Freq: Once | INTRAVENOUS | Status: DC

## 2017-01-02 MED ORDER — PIPERACILLIN-TAZOBACTAM 3.375 G IVPB
3.3750 g | Freq: Three times a day (TID) | INTRAVENOUS | Status: DC
  Administered 2017-01-03: 3.375 g via INTRAVENOUS
  Filled 2017-01-02: qty 50

## 2017-01-02 MED ORDER — HYDROCORTISONE NA SUCCINATE PF 250 MG IJ SOLR
200.0000 mg | Freq: Once | INTRAMUSCULAR | Status: AC
  Administered 2017-01-02: 200 mg via INTRAVENOUS
  Filled 2017-01-02: qty 200

## 2017-01-02 MED ORDER — BARIUM SULFATE 2.1 % PO SUSP
900.0000 mL | Freq: Once | ORAL | Status: AC
  Administered 2017-01-02: 900 mL via ORAL

## 2017-01-02 MED ORDER — POTASSIUM CHLORIDE CRYS ER 20 MEQ PO TBCR
40.0000 meq | EXTENDED_RELEASE_TABLET | Freq: Once | ORAL | Status: AC
  Administered 2017-01-03: 40 meq via ORAL
  Filled 2017-01-02: qty 2

## 2017-01-02 MED ORDER — VANCOMYCIN HCL IN DEXTROSE 750-5 MG/150ML-% IV SOLN
750.0000 mg | Freq: Two times a day (BID) | INTRAVENOUS | Status: DC
  Filled 2017-01-02: qty 150

## 2017-01-02 MED ORDER — VANCOMYCIN HCL IN DEXTROSE 1-5 GM/200ML-% IV SOLN
1000.0000 mg | Freq: Once | INTRAVENOUS | Status: AC
  Administered 2017-01-02: 1000 mg via INTRAVENOUS
  Filled 2017-01-02: qty 200

## 2017-01-02 NOTE — ED Triage Notes (Signed)
Pt had complicated chole surgery recently.  Pt left from ICU on Tuesday.  Pt states abdominal pain continues and worsening.  Also having bilateral leg edema.  Warm temps ? At night.

## 2017-01-02 NOTE — Progress Notes (Addendum)
Pharmacy Antibiotic Note  Jamie Allen is a 47 y.o. female admitted on 01/02/2017 with infection of surgical site and possible infected hematoma. Recently admitted 3/7-3/13 for cholecystectomy; now presenting with worsening abdominal pain. Pharmacy has been consulted for vancomycin and Zosyn dosing.  Plan:  Vancomycin 1000 mg IV now, then 750 mg IV q12 hr; goal trough 15-20 mcg/mL  Measure vancomycin trough levels at steady state as indicated  Zosyn 3.375 g IV given once over 30 minutes, then every 8 hrs by 4-hr infusion  SCr daily while on vancomycin and Zosyn    Temp (24hrs), Avg:98.4 F (36.9 C), Min:98.4 F (36.9 C), Max:98.4 F (36.9 C)   Recent Labs Lab 12/27/16 0516 12/29/16 0448  WBC 7.9 6.9  CREATININE 0.59 0.68    Estimated Creatinine Clearance: 76.8 mL/min (by C-G formula based on SCr of 0.68 mg/dL).    Allergies  Allergen Reactions  . Iohexol Hives, Itching and Swelling  . Morphine And Related Other (See Comments)    Unknown, patient is unaware of allergy Can tolerate with Benadryl  . Oxycodone Itching and Other (See Comments)    Can tolerate vicodin    Antimicrobials this admission: Zosyn 3/16 >>  Vancomycin 3/16 >>  Dose adjustments this admission: ---  Microbiology results: 3/16 BCx: sent  Thank you for allowing pharmacy to be a part of this patient's care.  Reuel Boom, PharmD, BCPS Pager: [phone] 01/02/2017, 8:57 PM
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
## 2017-01-03 ENCOUNTER — Encounter (HOSPITAL_COMMUNITY): Payer: Self-pay | Admitting: Surgery

## 2017-01-03 ENCOUNTER — Emergency Department (HOSPITAL_COMMUNITY): Payer: Self-pay

## 2017-01-03 ENCOUNTER — Inpatient Hospital Stay (HOSPITAL_COMMUNITY): Payer: Self-pay

## 2017-01-03 DIAGNOSIS — F419 Anxiety disorder, unspecified: Secondary | ICD-10-CM | POA: Insufficient documentation

## 2017-01-03 DIAGNOSIS — K219 Gastro-esophageal reflux disease without esophagitis: Secondary | ICD-10-CM | POA: Diagnosis present

## 2017-01-03 DIAGNOSIS — F418 Other specified anxiety disorders: Secondary | ICD-10-CM

## 2017-01-03 DIAGNOSIS — E669 Obesity, unspecified: Secondary | ICD-10-CM

## 2017-01-03 DIAGNOSIS — K8010 Calculus of gallbladder with chronic cholecystitis without obstruction: Secondary | ICD-10-CM

## 2017-01-03 DIAGNOSIS — R162 Hepatomegaly with splenomegaly, not elsewhere classified: Secondary | ICD-10-CM

## 2017-01-03 DIAGNOSIS — L03311 Cellulitis of abdominal wall: Secondary | ICD-10-CM

## 2017-01-03 LAB — URINALYSIS, ROUTINE W REFLEX MICROSCOPIC
BILIRUBIN URINE: NEGATIVE
Glucose, UA: NEGATIVE mg/dL
Ketones, ur: NEGATIVE mg/dL
NITRITE: NEGATIVE
PH: 5 (ref 5.0–8.0)
Protein, ur: NEGATIVE mg/dL
SPECIFIC GRAVITY, URINE: 1.005 (ref 1.005–1.030)

## 2017-01-03 LAB — CBC
HEMATOCRIT: 25.1 % — AB (ref 36.0–46.0)
Hemoglobin: 8.4 g/dL — ABNORMAL LOW (ref 12.0–15.0)
MCH: 31.8 pg (ref 26.0–34.0)
MCHC: 33.5 g/dL (ref 30.0–36.0)
MCV: 95.1 fL (ref 78.0–100.0)
PLATELETS: 104 10*3/uL — AB (ref 150–400)
RBC: 2.64 MIL/uL — AB (ref 3.87–5.11)
RDW: 21.4 % — AB (ref 11.5–15.5)
WBC: 8.8 10*3/uL (ref 4.0–10.5)

## 2017-01-03 LAB — COMPREHENSIVE METABOLIC PANEL
ALT: 18 U/L (ref 14–54)
AST: 48 U/L — ABNORMAL HIGH (ref 15–41)
Albumin: 2.9 g/dL — ABNORMAL LOW (ref 3.5–5.0)
Alkaline Phosphatase: 152 U/L — ABNORMAL HIGH (ref 38–126)
Anion gap: 12 (ref 5–15)
BUN: 5 mg/dL — ABNORMAL LOW (ref 6–20)
CHLORIDE: 106 mmol/L (ref 101–111)
CO2: 18 mmol/L — ABNORMAL LOW (ref 22–32)
CREATININE: 0.74 mg/dL (ref 0.44–1.00)
Calcium: 8 mg/dL — ABNORMAL LOW (ref 8.9–10.3)
Glucose, Bld: 157 mg/dL — ABNORMAL HIGH (ref 65–99)
POTASSIUM: 4.2 mmol/L (ref 3.5–5.1)
Sodium: 136 mmol/L (ref 135–145)
TOTAL PROTEIN: 6.6 g/dL (ref 6.5–8.1)
Total Bilirubin: 1.9 mg/dL — ABNORMAL HIGH (ref 0.3–1.2)

## 2017-01-03 LAB — MAGNESIUM
Magnesium: 1.6 mg/dL — ABNORMAL LOW (ref 1.7–2.4)
Magnesium: 2.7 mg/dL — ABNORMAL HIGH (ref 1.7–2.4)

## 2017-01-03 LAB — PROTIME-INR
INR: 1.51
PROTHROMBIN TIME: 18.4 s — AB (ref 11.4–15.2)

## 2017-01-03 LAB — AMMONIA: AMMONIA: 39 umol/L — AB (ref 9–35)

## 2017-01-03 LAB — LIPASE, BLOOD: Lipase: 28 U/L (ref 11–51)

## 2017-01-03 MED ORDER — LEVOTHYROXINE SODIUM 75 MCG PO TABS
75.0000 ug | ORAL_TABLET | Freq: Every day | ORAL | Status: DC
  Administered 2017-01-04 – 2017-01-06 (×3): 75 ug via ORAL
  Filled 2017-01-03 (×3): qty 1

## 2017-01-03 MED ORDER — MAGNESIUM SULFATE 2 GM/50ML IV SOLN
2.0000 g | Freq: Once | INTRAVENOUS | Status: AC
  Administered 2017-01-03: 2 g via INTRAVENOUS
  Filled 2017-01-03: qty 50

## 2017-01-03 MED ORDER — PIPERACILLIN-TAZOBACTAM 3.375 G IVPB
3.3750 g | Freq: Three times a day (TID) | INTRAVENOUS | Status: DC
  Administered 2017-01-03 – 2017-01-04 (×3): 3.375 g via INTRAVENOUS
  Filled 2017-01-03 (×4): qty 50

## 2017-01-03 MED ORDER — GABAPENTIN 300 MG PO CAPS
900.0000 mg | ORAL_CAPSULE | Freq: Three times a day (TID) | ORAL | Status: DC
Start: 2017-01-03 — End: 2017-01-03

## 2017-01-03 MED ORDER — ACETAMINOPHEN 650 MG RE SUPP: 650.0000 mg | Freq: Four times a day (QID) | RECTAL | Status: DC | PRN

## 2017-01-03 MED ORDER — HYDROMORPHONE HCL 1 MG/ML IJ SOLN
1.0000 mg | Freq: Once | INTRAMUSCULAR | Status: AC
  Administered 2017-01-03: 1 mg via INTRAVENOUS
  Filled 2017-01-03: qty 1

## 2017-01-03 MED ORDER — IOPAMIDOL (ISOVUE-300) INJECTION 61%
INTRAVENOUS | Status: AC
  Filled 2017-01-03: qty 100

## 2017-01-03 MED ORDER — METHOCARBAMOL 500 MG PO TABS
1000.0000 mg | ORAL_TABLET | Freq: Every evening | ORAL | Status: DC | PRN
  Administered 2017-01-03 – 2017-01-05 (×3): 1000 mg via ORAL
  Filled 2017-01-03 (×3): qty 2

## 2017-01-03 MED ORDER — PSYLLIUM 95 % PO PACK
1.0000 | PACK | Freq: Every day | ORAL | Status: DC
  Filled 2017-01-03: qty 1

## 2017-01-03 MED ORDER — HYDROMORPHONE HCL 1 MG/ML IJ SOLN: 1.0000 mg | INTRAMUSCULAR | Status: DC | PRN

## 2017-01-03 MED ORDER — ONDANSETRON HCL 4 MG/2ML IJ SOLN: 4.0000 mg | Freq: Three times a day (TID) | INTRAMUSCULAR | Status: DC | PRN

## 2017-01-03 MED ORDER — PSYLLIUM 95 % PO PACK
1.0000 | PACK | Freq: Two times a day (BID) | ORAL | Status: DC
  Administered 2017-01-04: 1 via ORAL
  Filled 2017-01-03 (×3): qty 1

## 2017-01-03 MED ORDER — SODIUM CHLORIDE 0.9 % IV SOLN
8.0000 mg | Freq: Four times a day (QID) | INTRAVENOUS | Status: DC | PRN
  Filled 2017-01-03: qty 4

## 2017-01-03 MED ORDER — GABAPENTIN 300 MG PO CAPS
300.0000 mg | ORAL_CAPSULE | Freq: Every day | ORAL | Status: DC
  Administered 2017-01-03 – 2017-01-05 (×3): 300 mg via ORAL
  Filled 2017-01-03 (×4): qty 1

## 2017-01-03 MED ORDER — OXYCODONE HCL 5 MG PO TABS
5.0000 mg | ORAL_TABLET | Freq: Four times a day (QID) | ORAL | Status: DC | PRN
  Administered 2017-01-03 – 2017-01-05 (×4): 10 mg via ORAL
  Filled 2017-01-03 (×4): qty 2

## 2017-01-03 MED ORDER — DIPHENHYDRAMINE HCL 50 MG/ML IJ SOLN
50.0000 mg | Freq: Once | INTRAMUSCULAR | Status: AC
  Administered 2017-01-03: 50 mg via INTRAVENOUS
  Filled 2017-01-03: qty 1

## 2017-01-03 MED ORDER — IOPAMIDOL (ISOVUE-300) INJECTION 61%
100.0000 mL | Freq: Once | INTRAVENOUS | Status: AC | PRN
  Administered 2017-01-03: 100 mL via INTRAVENOUS

## 2017-01-03 MED ORDER — DEXTROSE 5 % IV SOLN
2.0000 g | Freq: Every day | INTRAVENOUS | Status: DC
  Administered 2017-01-03: 2 g via INTRAVENOUS
  Filled 2017-01-03: qty 2

## 2017-01-03 MED ORDER — HYDRALAZINE HCL 20 MG/ML IJ SOLN: 5.0000 mg | Freq: Four times a day (QID) | INTRAMUSCULAR | Status: DC | PRN

## 2017-01-03 MED ORDER — SODIUM CHLORIDE 0.9 % IV SOLN: 30.0000 meq | Freq: Once | INTRAVENOUS | Status: DC

## 2017-01-03 MED ORDER — ONDANSETRON HCL 4 MG/2ML IJ SOLN
4.0000 mg | Freq: Four times a day (QID) | INTRAMUSCULAR | Status: DC | PRN
  Administered 2017-01-06: 4 mg via INTRAVENOUS
  Filled 2017-01-03 (×2): qty 2

## 2017-01-03 MED ORDER — ENOXAPARIN SODIUM 40 MG/0.4ML ~~LOC~~ SOLN
40.0000 mg | SUBCUTANEOUS | Status: DC
  Administered 2017-01-03: 40 mg via SUBCUTANEOUS
  Filled 2017-01-03: qty 0.4

## 2017-01-03 MED ORDER — METOPROLOL TARTRATE 5 MG/5ML IV SOLN: 5.0000 mg | Freq: Four times a day (QID) | INTRAVENOUS | Status: DC | PRN

## 2017-01-03 MED ORDER — DIPHENHYDRAMINE HCL 50 MG/ML IJ SOLN
12.5000 mg | Freq: Four times a day (QID) | INTRAMUSCULAR | Status: DC | PRN
  Administered 2017-01-03: 12.5 mg via INTRAVENOUS
  Administered 2017-01-05: 25 mg via INTRAVENOUS
  Filled 2017-01-03 (×2): qty 1

## 2017-01-03 MED ORDER — ONDANSETRON 4 MG PO TBDP: 4.0000 mg | ORAL_TABLET | Freq: Four times a day (QID) | ORAL | Status: DC | PRN

## 2017-01-03 MED ORDER — POTASSIUM CHLORIDE 10 MEQ/100ML IV SOLN
10.0000 meq | INTRAVENOUS | Status: AC
  Administered 2017-01-03 (×3): 10 meq via INTRAVENOUS
  Filled 2017-01-03 (×3): qty 100

## 2017-01-03 MED ORDER — PROCHLORPERAZINE EDISYLATE 5 MG/ML IJ SOLN
5.0000 mg | INTRAMUSCULAR | Status: DC | PRN
  Administered 2017-01-06: 10 mg via INTRAVENOUS
  Filled 2017-01-03: qty 2

## 2017-01-03 MED ORDER — ALUM & MAG HYDROXIDE-SIMETH 200-200-20 MG/5ML PO SUSP
30.0000 mL | Freq: Four times a day (QID) | ORAL | Status: DC | PRN
  Administered 2017-01-03: 30 mL via ORAL
  Filled 2017-01-03 (×2): qty 30

## 2017-01-03 MED ORDER — HYDROMORPHONE HCL 1 MG/ML IJ SOLN
0.5000 mg | INTRAMUSCULAR | Status: DC | PRN
  Administered 2017-01-03: 2 mg via INTRAVENOUS
  Administered 2017-01-03 (×2): 1 mg via INTRAVENOUS
  Administered 2017-01-03: 2 mg via INTRAVENOUS
  Administered 2017-01-03 – 2017-01-04 (×2): 1 mg via INTRAVENOUS
  Administered 2017-01-04 (×3): 2 mg via INTRAVENOUS
  Filled 2017-01-03: qty 1
  Filled 2017-01-03 (×2): qty 2
  Filled 2017-01-03 (×3): qty 1
  Filled 2017-01-03 (×3): qty 2

## 2017-01-03 MED ORDER — MENTHOL 3 MG MT LOZG
1.0000 | LOZENGE | OROMUCOSAL | Status: DC | PRN
  Filled 2017-01-03: qty 9

## 2017-01-03 MED ORDER — ESTRADIOL 1 MG PO TABS
1.0000 mg | ORAL_TABLET | Freq: Every day | ORAL | Status: DC
  Administered 2017-01-03 – 2017-01-06 (×4): 1 mg via ORAL
  Filled 2017-01-03 (×4): qty 1

## 2017-01-03 MED ORDER — TAB-A-VITE/IRON PO TABS
1.0000 | ORAL_TABLET | Freq: Every day | ORAL | Status: DC
  Administered 2017-01-03 – 2017-01-06 (×4): 1 via ORAL
  Filled 2017-01-03 (×4): qty 1

## 2017-01-03 MED ORDER — BISACODYL 10 MG RE SUPP: 10.0000 mg | Freq: Two times a day (BID) | RECTAL | Status: DC | PRN

## 2017-01-03 MED ORDER — LACTATED RINGERS IV BOLUS (SEPSIS): 1000.0000 mL | Freq: Three times a day (TID) | INTRAVENOUS | Status: AC | PRN

## 2017-01-03 MED ORDER — POLYETHYLENE GLYCOL 3350 17 G PO PACK
17.0000 g | PACK | Freq: Two times a day (BID) | ORAL | Status: DC | PRN
  Filled 2017-01-03: qty 1

## 2017-01-03 MED ORDER — METHOCARBAMOL 1000 MG/10ML IJ SOLN
1000.0000 mg | Freq: Four times a day (QID) | INTRAVENOUS | Status: DC | PRN
  Filled 2017-01-03: qty 10

## 2017-01-03 MED ORDER — PHENOL 1.4 % MT LIQD
2.0000 | OROMUCOSAL | Status: DC | PRN
  Filled 2017-01-03: qty 177

## 2017-01-03 MED ORDER — SODIUM CHLORIDE 0.9 % IV SOLN
INTRAVENOUS | Status: DC
  Administered 2017-01-03 – 2017-01-04 (×3): via INTRAVENOUS

## 2017-01-03 MED ORDER — PREGABALIN 50 MG PO CAPS
50.0000 mg | ORAL_CAPSULE | Freq: Every day | ORAL | Status: DC
  Administered 2017-01-03 – 2017-01-06 (×4): 50 mg via ORAL
  Filled 2017-01-03 (×4): qty 1

## 2017-01-03 MED ORDER — LIP MEDEX EX OINT
1.0000 "application " | TOPICAL_OINTMENT | Freq: Two times a day (BID) | CUTANEOUS | Status: DC
  Administered 2017-01-03 – 2017-01-06 (×6): 1 via TOPICAL
  Filled 2017-01-03 (×4): qty 7

## 2017-01-03 MED ORDER — HYDROMORPHONE HCL 1 MG/ML IJ SOLN
1.0000 mg | INTRAMUSCULAR | Status: DC | PRN
  Filled 2017-01-03: qty 1

## 2017-01-03 MED ORDER — MAGIC MOUTHWASH
15.0000 mL | Freq: Four times a day (QID) | ORAL | Status: DC | PRN
  Filled 2017-01-03: qty 15

## 2017-01-03 MED ORDER — ACETAMINOPHEN 325 MG PO TABS
325.0000 mg | ORAL_TABLET | Freq: Four times a day (QID) | ORAL | Status: DC | PRN
  Administered 2017-01-03: 650 mg via ORAL
  Filled 2017-01-03: qty 2

## 2017-01-03 MED ORDER — LACTATED RINGERS IV SOLN: INTRAVENOUS | Status: DC

## 2017-01-03 MED ORDER — DIAZEPAM 5 MG PO TABS
5.0000 mg | ORAL_TABLET | Freq: Two times a day (BID) | ORAL | Status: DC | PRN
  Administered 2017-01-03 – 2017-01-05 (×4): 5 mg via ORAL
  Filled 2017-01-03 (×4): qty 1

## 2017-01-03 NOTE — Consult Note (Signed)
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 and three-dimensional MRCP images were rendered by post processing. CONTRAST:  68m MULTIHANCE GADOBENATE DIMEGLUMINE 529 MG/ML IV SOLN COMPARISON:  CT scan 12/14/2016 FINDINGS: Lower chest:  Unremarkable. Hepatobiliary: Loss of signal within the hepatic parenchyma on out of phase T1 weighted imaging is compatible with fatty deposition. The liver remains enlarged at 21.5 cm craniocaudal length.Probable small volume sludge dependently in the gallbladder lumen without discrete stones evident. No gallbladder wall thickening or pericholecystic fluid. No intra or extrahepatic biliary duct dilatation. Extrahepatic common duct measures 6 mm diameter. Common bile duct in the head of the pancreas is 5 mm diameter. Pancreas: No focal mass lesion. No dilatation of the main duct. No intraparenchymal cyst. No peripancreatic edema. Spleen: Spleen is 15.5 cm cranial caudal length. No focal mass lesion. Adrenals/Urinary  Tract: No adrenal nodule or mass. Left kidney is atrophic with areas of focal cortical scarring. Right kidney unremarkable. Stomach/Bowel: Stomach is nondistended. No gastric wall thickening. No evidence of outlet obstruction. Duodenum is normally positioned as is the ligament of Treitz. No small bowel or colonic dilatation within the visualized abdomen. Vascular/Lymphatic: No abdominal aortic aneurysm. No abdominal lymphadenopathy. Other: No intraperitoneal free fluid. Musculoskeletal: No abnormal marrow enhancement within the visualized bony anatomy. IMPRESSION: 1. Hepatic steatosis with hepatomegaly. 2. No evidence of gallstones. No gallbladder wall thickening or pericholecystic fluid. No biliary ductal dilatation. No choledocholithiasis. 3. Spleen upper limits of normal for size to borderline enlarged. Electronically Signed   By: EMisty StanleyM.D.   On: 12/25/2016 07:16   Ct Angio Abd/pel W And/or Wo Contrast  Result Date: 12/14/2016 CLINICAL DATA:  Chronic escalating abdominal pain for 2 weeks with nausea and vomiting. EXAM: CT ANGIOGRAPHY ABDOMEN AND PELVIS TECHNIQUE: Multidetector CT imaging of the abdomen and pelvis was performed using the standard protocol during bolus administration of intravenous contrast. Multiplanar reconstructed images including MIPs were obtained and reviewed to evaluate the vascular anatomy. CONTRAST:  80 cc Isovue 370 COMPARISON:  12/08/2016 CT without contrast FINDINGS: Arterial findings: Aorta: Intact abdominal aorta. No significant atherosclerotic change. No occlusion, dissection, aneurysm, or retroperitoneal hemorrhage. Celiac axis: Widely patent origin. Splenic, left gastric, hepatic branches are patent. Superior mesenteric:  Widely patent origin. Left renal: Minor origin atherosclerosis. Minimal ostial narrowing without stenosis. Accessory left renal artery to the lower pole also patent. Right renal:          Widely patent. Inferior mesenteric:  Widely patent. Left iliac:  Left common, internal and external iliac arteries are patent. No inflow disease. Right iliac: Right common, internal and external iliac arteries are patent. No inflow disease. Venous findings: Hepatic, portal, splenic, and mesenteric veins are patent. No veno-occlusive process. IVC, and renal veins appear patent. Limited opacification of the iliac and femoral veins. Review of the MIP images confirms the above findings. Nonvascular findings: Bibasilar atelectasis. No significant pleural effusion. No pericardial effusion. Normal heart size. Hepatomegaly evident. Liver measures 23 cm in length. No focal hepatic abnormality or biliary dilatation. Trace amount of pericholecystic fluid, nonspecific. Difficult to exclude mild cholecystitis. Consider further evaluation with ultrasound. Spleen is enlarged measuring 16 cm in length. No focal splenic abnormality. Pancreas demonstrates normal and without acute process. Normal appearing adrenal glands. Kidneys demonstrate no acute obstruction. Left kidney demonstrates upper pole cortical atrophy appearing chronic. No renal obstruction or hydronephrosis. No obstructing ureteral calculus.  No bladder abnormality. Negative bowel obstruction, significant dilatation, ileus, or free air. Appendix is unremarkable without distention or inflammation. No fluid collection or abscess. Trace pelvic free fluid, suspect physiologic. Uterus and adnexal normal in size. No pelvic fluid collection or hemorrhage. No inguinal abnormality or adenopathy.  Intact abdominal wall. No acute osseous finding. IMPRESSION: Patent mesenteric and renal vasculature. Minor abdominal aortic atherosclerosis without acute vascular process. No evidence of mesenteric or renal vascular occlusive process. Hepatosplenomegaly. Bibasilar atelectasis Trace pericholecystic fluid and slight gallbladder distention, nonspecific but difficult to exclude cholecystitis. Recommend correlation with exam and consider right upper  quadrant ultrasound. Electronically Signed   By: Jerilynn Mages.  Shick M.D.   On: 12/14/2016 15:15   US Abdomen Limited Ruq  Result Date: 12/20/2016 CLINICAL DATA:  Abdominal pain since December 01, 2016 EXAM: US ABDOMEN LIMITED - RIGHT UPPER QUADRANT COMPARISON:  12/14/2016 FINDINGS: Gallbladder: Layering sludge. No shadowing gallstone noted. No sonographic Murphy sign or inflammatory wall thickening. Common bile duct: Diameter: 5-6 mm.  Where visualized, no filling defect. Liver: Echogenic compatible with steatosis. No evidence of mass lesion. Antegrade flow in the main portal vein. IMPRESSION: 1. Sludge without shadowing stone.  No signs of cholecystitis. 2. Hepatic steatosis. Electronically Signed   By: Monte Fantasia M.D.   On: 12/20/2016 17:19   US Abdomen Limited Ruq  Result Date: 12/14/2016 CLINICAL DATA:  Right upper quadrant pain for 3 years. EXAM: US ABDOMEN LIMITED - RIGHT UPPER QUADRANT COMPARISON:  CT, 12/14/2016 at 1437 hours FINDINGS: Gallbladder: Gallbladder is distended. There is dependent sludge, but no shadowing stones. Wall is not thickened, but there is mild pericholecystic fluid between the gallbladder and liver. No sonographic Murphy's sign. Common bile duct: Diameter: 4.5 mm Liver: Mildly enlarged with heterogeneous increased echogenicity and an overall coarsened echotexture. No discrete liver mass or focal lesion. IMPRESSION: 1. No evidence of acute cholecystitis.  No bile duct dilation. 2. Gallbladder sludge but no shadowing stones. 3. Pericholecystic edema seen between the gallbladder and liver. This is likely on the basis of liver pathology. 4. Hepatomegaly. Sonographic appearance of the liver suggests heterogeneous fatty infiltration, cirrhosis or a combination. Electronically Signed   By: Lajean Manes M.D.   On: 12/14/2016 17:03    Medications / Allergies: per chart  Antibiotics: Anti-infectives    Start     Dose/Rate Route Frequency Ordered Stop   01/03/17 0800  vancomycin  (VANCOCIN) IVPB 750 mg/150 ml premix     750 mg 150 mL/hr over 60 Minutes Intravenous Every 12 hours 01/02/17 2050     01/03/17 0600  piperacillin-tazobactam (ZOSYN) IVPB 3.375 g     3.375 g 12.5 mL/hr over 240 Minutes Intravenous Every 8 hours 01/02/17 2123     01/02/17 2200  piperacillin-tazobactam (ZOSYN) IVPB 3.375 g     3.375 g 100 mL/hr over 30 Minutes Intravenous  Once 01/02/17 2123     01/02/17 2100  vancomycin (VANCOCIN) IVPB 1000 mg/200 mL premix     1,000 mg 200 mL/hr over 60 Minutes Intravenous  Once 01/02/17 2050 01/02/17 2330   01/02/17 2015  piperacillin-tazobactam (ZOSYN) IVPB 3.375 g     3.375 g 12.5 mL/hr over 240 Minutes Intravenous  Once 01/02/17 2009 01/02/17 2113        Note: Portions of this report may have been transcribed using voice recognition software. Every effort was made to ensure accuracy; however, inadvertent computerized transcription errors may be present.   Any transcriptional errors that result from this process are unintentional.  Jamie Allen, M.D., F.A.C.S. Gastrointestinal and Minimally Invasive Surgery [facility] [address], [place], [place] [address] [phone] Main / Paging   01/03/2017

## 2017-01-03 NOTE — ED Notes (Signed)
at

## 2017-01-03 NOTE — Progress Notes (Signed)
Pharmacy Antibiotic Note  Adyline Huberty is a 46 y.o. female admitted on 01/02/2017 with cellulitis.  Pharmacy has been consulted for Ceftriaxone dosing.  Plan: Ceftriaxone 2gm iv q24hr      Temp (24hrs), Avg:98.4 F (36.9 C), Min:98.4 F (36.9 C), Max:98.4 F (36.9 C)   Recent Labs Lab 12/27/16 0516 12/29/16 0448 01/02/17 2022 01/02/17 2112  WBC 7.9 6.9 10.8*  --   CREATININE 0.59 0.68 0.79  --   LATICACIDVEN  --   --   --  1.62    Estimated Creatinine Clearance: 76.8 mL/min (by C-G formula based on SCr of 0.79 mg/dL).    Allergies  Allergen Reactions  . Iohexol Hives, Itching and Swelling  . Morphine And Related Other (See Comments)    Unknown, patient is unaware of allergy Can tolerate with Benadryl  . Oxycodone Itching and Other (See Comments)    Can tolerate vicodin    Antimicrobials this admission: Zosyn 3/16 >>  Vancomycin 3/16 >> Ceftriaxone 3/17 >>  Dose adjustments this admission: -  Microbiology results: 3/16 BCx: sent  Thank you for allowing pharmacy to be a part of this patient's care.  Nani Skillern Crowford 01/03/2017 3:19 AM
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
## 2017-01-03 NOTE — ED Notes (Signed)
Pt eating sub sandwich and bagel. Discussed prior order & discussion for full liquid diet. Patient had previously agreed to full liquid diet. She continues to eat solid foods and says 'I'll drink plenty of water.'

## 2017-01-03 NOTE — ED Notes (Signed)
Dr. Timothy Opyd, hospitalist, at bedside. 

## 2017-01-03 NOTE — Evaluation (Signed)
Occupational Therapy Evaluation [name] MRN: [id number] DOB: 03/04/1971 Today's Date: 01/03/2017    History of Present Illness Pt admitted with abdominal pain and cellulites, s/p lap chole on 12/25/16. PMH: B LE fxs, obesity, anemia, peripheral neuropathy, cirrhosis, ETOH abuse, anxiety and depression, PTSD.   Clinical Impression   Pt with increased pain, poor attention requiring increased time for mobility. Per boyfriend, pt requires assist for LB dressing, tub transfers and all IADL. She uses a RW for ambulation, per his report, and therefore cannot carry anything. Pt demonstrating ability to walk with min guard assist pushing IV pole and to cross her foot over opposite knee to reach her feet with ease. Will follow acutely, but do not anticipate pt will need post acute OT. Boyfriend expressing frustration that pt's symptoms seem to be frequently dismissed.     Follow Up Recommendations  No OT follow up    Equipment Recommendations  None recommended by OT    Recommendations for Other Services       Precautions / Restrictions Precautions Precautions: Fall Restrictions Weight Bearing Restrictions: No      Mobility Bed Mobility Overal bed mobility: Needs Assistance Bed Mobility: Supine to Sit;Sit to Supine     Supine to sit: Supervision Sit to supine: Min assist   General bed mobility comments: assisted for LEs back into bed  Transfers Overall transfer level: Needs assistance   Transfers: Sit to/from Stand Sit to Stand: Min guard         General transfer comment: increased time, min guard assist for safety    Balance                                            ADL Overall ADL's : Needs assistance/impaired Eating/Feeding: Independent;Bed level   Grooming: Wash/dry hands;Min guard;Standing   Upper Body Bathing: Sitting;Minimal assistance Upper Body Bathing Details (indicate cue type and reason): assist for  back Lower Body Bathing: Sit to/from stand;Min guard   Upper Body Dressing : Set up;Sitting   Lower Body Dressing: Sit to/from stand;Minimal assistance   Toilet Transfer: Min guard;Ambulation;BSC (pushed IV pole)   Toileting- Clothing Manipulation and Hygiene: Min guard;Sit to/from stand       Functional mobility during ADLs: Min guard (pushed IV pole) General ADL Comments: Pt is able to cross her foot over opposite knee to reach feet for ADL.     Vision Baseline Vision/History: No visual deficits Patient Visual Report: Blurring of vision (intermitten)       Perception     Praxis      Pertinent Vitals/Pain Pain Assessment: 0-10 Pain Score: 8  Pain Location: abdomen Pain Descriptors / Indicators: Aching Pain Intervention(s): Monitored during session;Premedicated before session;Repositioned     Hand Dominance Right   Extremity/Trunk Assessment Upper Extremity Assessment Upper Extremity Assessment: Overall WFL for tasks assessed   Lower Extremity Assessment Lower Extremity Assessment: Defer to PT evaluation       Communication Communication Communication: No difficulties   Cognition Arousal/Alertness: Awake/alert Behavior During Therapy: Flat affect Overall Cognitive Status: Impaired/Different from baseline Area of Impairment: Attention   Current Attention Level: Sustained (at best)           General Comments: pt easily distracted   General Comments       Exercises       Shoulder Instructions  Home Living Family/patient expects to be discharged to:: Private residence Living Arrangements: Spouse/significant other (boyfriend) Available Help at Discharge: Family;Available 24 hours/day Type of Home: Apartment Home Access: Stairs to enter [employer] of Steps: flight Entrance Stairs-Rails: Right;Left Home Layout: One level     Bathroom Shower/Tub: Teacher, early years/pre: Standard     Home Equipment: Shower  seat;Cane - single point;Wheelchair - [employer] - 2 wheels;Grab bars - tub/shower   Additional Comments: mom is also staying with pt      Prior Functioning/Environment Level of Independence: Needs assistance  Gait / Transfers Assistance Needed: walking with walker ADL's / Homemaking Assistance Needed: assist for tub transfer for shoes and socks, assist for meal prep, housekeeping   Comments: per boyfriend, pt has not been able to qualify for disability        OT Problem List: Impaired balance (sitting and/or standing);Pain;Obesity      OT Treatment/Interventions: Self-care/ADL training;Patient/family education    OT Goals(Current goals can be found in the care plan section) Acute Rehab OT Goals Patient Stated Goal: to stay in the hospital for a week OT Goal Formulation: With patient Time For Goal Achievement: 01/10/17 Potential to Achieve Goals: Good ADL Goals Pt Will Perform Grooming: with modified independence;standing Pt Will Perform Lower Body Bathing: with modified independence;sit to/from stand Pt Will Perform Lower Body Dressing: with modified independence;sit to/from stand Pt Will Transfer to Toilet: with modified independence;ambulating Pt Will Perform Toileting - Clothing Manipulation and hygiene: with modified independence;sit to/from stand Pt Will Perform Tub/Shower Transfer: Tub transfer;with supervision;ambulating;shower seat  OT Frequency: Min 2X/week   Barriers to D/C:            Co-evaluation              End of Session Nurse Communication:  (reports pt has walked half of the unit with IV pole today)  Activity Tolerance: Patient tolerated treatment well Patient left: in bed;with call bell/phone within reach;with family/visitor present  OT Visit Diagnosis: Unsteadiness on feet (R26.81);Pain Pain - part of body:  (abdomen)                ADL either performed or assessed with clinical judgement  Time: 0932-6712 OT Time Calculation (min): 33  min Charges:  OT General Charges $OT Visit: 1 Procedure OT Evaluation $OT Eval Moderate Complexity: 1 Procedure OT Treatments $Self Care/Home Management : 8-22 mins G-Codes:     Malka So 01/03/2017, 3:07 PM  [phone]

## 2017-01-03 NOTE — Progress Notes (Signed)
*  Preliminary Results* Right lower extremity venous duplex completed. Right lower extremity is negative for deep vein thrombosis. There is no evidence of right Baker's cyst.  01/03/2017 2:39 PM  Maudry Mayhew, BS, RVT, RDCS, RDMS

## 2017-01-03 NOTE — ED Notes (Signed)
Pt given meal tray. She refused soft diet and liquid diet. Pt tearful at suggestion of anything other than solid food.

## 2017-01-04 DIAGNOSIS — E034 Atrophy of thyroid (acquired): Secondary | ICD-10-CM

## 2017-01-04 DIAGNOSIS — E876 Hypokalemia: Secondary | ICD-10-CM

## 2017-01-04 DIAGNOSIS — K219 Gastro-esophageal reflux disease without esophagitis: Secondary | ICD-10-CM

## 2017-01-04 LAB — COMPREHENSIVE METABOLIC PANEL
ALT: 18 U/L (ref 14–54)
AST: 51 U/L — ABNORMAL HIGH (ref 15–41)
Albumin: 3.1 g/dL — ABNORMAL LOW (ref 3.5–5.0)
Alkaline Phosphatase: 144 U/L — ABNORMAL HIGH (ref 38–126)
Anion gap: 10 (ref 5–15)
BUN: 8 mg/dL (ref 6–20)
CHLORIDE: 108 mmol/L (ref 101–111)
CO2: 21 mmol/L — ABNORMAL LOW (ref 22–32)
CREATININE: 0.71 mg/dL (ref 0.44–1.00)
Calcium: 8.2 mg/dL — ABNORMAL LOW (ref 8.9–10.3)
GFR calc Af Amer: >60 mL/min (ref >60)
GFR calc non Af Amer: >60 mL/min (ref >60)
GLUCOSE: 107 mg/dL — AB (ref 65–99)
POTASSIUM: 3.1 mmol/L — AB (ref 3.5–5.1)
SODIUM: 139 mmol/L (ref 135–145)
Total Bilirubin: 1.2 mg/dL (ref 0.3–1.2)
Total Protein: 6.9 g/dL (ref 6.5–8.1)

## 2017-01-04 LAB — URINALYSIS, ROUTINE W REFLEX MICROSCOPIC
BILIRUBIN URINE: NEGATIVE
Bacteria, UA: NONE SEEN
Glucose, UA: NEGATIVE mg/dL
KETONES UR: NEGATIVE mg/dL
LEUKOCYTES UA: NEGATIVE
Nitrite: NEGATIVE
PH: 5 (ref 5.0–8.0)
Protein, ur: NEGATIVE mg/dL
Specific Gravity, Urine: 1.014 (ref 1.005–1.030)

## 2017-01-04 LAB — CBC
HCT: 23.7 % — ABNORMAL LOW (ref 36.0–46.0)
Hemoglobin: 8.3 g/dL — ABNORMAL LOW (ref 12.0–15.0)
MCH: 34.3 pg — ABNORMAL HIGH (ref 26.0–34.0)
MCHC: 35 g/dL (ref 30.0–36.0)
MCV: 97.9 fL (ref 78.0–100.0)
PLATELETS: 130 10*3/uL — AB (ref 150–400)
RBC: 2.42 MIL/uL — AB (ref 3.87–5.11)
RDW: 22 % — AB (ref 11.5–15.5)
WBC: 10.9 10*3/uL — AB (ref 4.0–10.5)

## 2017-01-04 LAB — TSH: TSH: 1.004 u[IU]/mL (ref 0.350–4.500)

## 2017-01-04 MED ORDER — SODIUM CHLORIDE 0.9% FLUSH
3.0000 mL | Freq: Two times a day (BID) | INTRAVENOUS | Status: DC
  Administered 2017-01-04 – 2017-01-06 (×4): 3 mL via INTRAVENOUS

## 2017-01-04 MED ORDER — SODIUM CHLORIDE 0.9 % IV SOLN: 250.0000 mL | INTRAVENOUS | Status: DC | PRN

## 2017-01-04 MED ORDER — PSYLLIUM 95 % PO PACK
1.0000 | PACK | Freq: Every day | ORAL | Status: DC
  Administered 2017-01-05: 1 via ORAL
  Filled 2017-01-04: qty 1

## 2017-01-04 MED ORDER — LACTATED RINGERS IV BOLUS (SEPSIS): 1000.0000 mL | Freq: Three times a day (TID) | INTRAVENOUS | Status: AC | PRN

## 2017-01-04 MED ORDER — POTASSIUM CHLORIDE 20 MEQ/15ML (10%) PO SOLN
40.0000 meq | Freq: Two times a day (BID) | ORAL | Status: AC
  Administered 2017-01-04 (×2): 40 meq via ORAL
  Filled 2017-01-04 (×2): qty 30

## 2017-01-04 MED ORDER — HYDROMORPHONE HCL 1 MG/ML IJ SOLN
0.5000 mg | INTRAMUSCULAR | Status: DC | PRN
  Administered 2017-01-04 – 2017-01-05 (×3): 1 mg via INTRAVENOUS
  Filled 2017-01-04 (×3): qty 1

## 2017-01-04 MED ORDER — SODIUM CHLORIDE 0.9% FLUSH: 3.0000 mL | INTRAVENOUS | Status: DC | PRN

## 2017-01-04 MED ORDER — SACCHAROMYCES BOULARDII 250 MG PO CAPS
250.0000 mg | ORAL_CAPSULE | Freq: Two times a day (BID) | ORAL | Status: DC
  Administered 2017-01-04 – 2017-01-06 (×5): 250 mg via ORAL
  Filled 2017-01-04 (×5): qty 1

## 2017-01-04 NOTE — Progress Notes (Addendum)
PROGRESS NOTE                                                                                                                                                                                                             Patient Demographics:    Jamie Allen, is a 46 y.o. female, DOB - 05-11-1971, YHC:[id number]  Admit date - 01/02/2017   Admitting Physician Hosie Poisson, MD  Outpatient Primary MD for the patient is Minerva Ends, MD  LOS - 1    Chief Complaint  Patient presents with  . Abdominal Pain  . Leg Pain  . Post-op Problem       Brief Narrative   46 y.o. female with medical history significant of hypothyroidism, anxiety , depression, chronic cholecystitis s/p lap cholecystectomy on 3/8. GERD, obesity, ? Cirrhosis, h/o alcohol dependence, anemia of chronic disease, hypertension, peripheral neuropathy presents to ED with complaints of abdominal pain, as well as significant ecchymosis in lower abdomen/right hip area.   Subjective:    Jamie Allen today has, No headache, No chest pain, Positive abdominal pain, dysuria, extremely anxious .    Assessment  & Plan :    Active Problems:   Cirrhosis ([place])   Anxiety and depression   Hypokalemia   Alcohol dependence ([facility])   Neuropathic pain, leg, bilateral   Hypothyroidism   Chronic cholecystitis with calculus s/p lap cholecystectomy 12/25/2016   Hepatosplenomegaly   GERD (gastroesophageal reflux disease)   Obesity (BMI 30-39.9)   Abdominal wall cellulitis   Hypomagnesemia  Abdominal wall ecchymosis - It does not look like cellulitis, she is afebrile, has no leukocytosis, especially with her history of C. difficile I will discontinue IV antibiotics, and will start on probiotics. - SCDs for DVT prophylaxis - hemoglobin has been stable, will monitor closely, unclear the provoking factor of her ecchymosis, she denies any fall or trauma. - Right lower extremity ultrasound is negative for  DVT  Hypothyroidism - Continue Synthroid  Chronic cholecystitis s/p lap cholecystectomy - Surgery on board.   GERD - Continue with PPI  Hypokalemia - repleted , recheck in a.m.  Neuropathy - continue medication  Anxiety and depression - Continue with home medication  Patient complaining of dysuria, will check urinalysis, as well had 250 mL  post residual volume on ladders can after 200 mL urine output, will monitor  closely for urinary retention, encouraged to ambulate  .  Code Status : Full  Family Communication  : Mother and significant other at bedside  Disposition Plan  : Home when stable  Consults  :  Gen surgery  Procedures  : None  DVT Prophylaxis  :  SCDs , no DVT prophylaxis in the setting of significant ecchymosis  Lab Results  Component Value Date   PLT 130 (L) 01/04/2017    Antibiotics  :    Anti-infectives    Start     Dose/Rate Route Frequency Ordered Stop   01/03/17 1200  piperacillin-tazobactam (ZOSYN) IVPB 3.375 g  Status:  Discontinued    Comments:  Pharmacy may adjust dosing strength, schedule, rate of infusion, etc as needed to optimize therapy   3.375 g 12.5 mL/hr over 240 Minutes Intravenous Every 8 hours 01/03/17 0746 01/04/17 1225   01/03/17 0800  vancomycin (VANCOCIN) IVPB 750 mg/150 ml premix  Status:  Discontinued     750 mg 150 mL/hr over 60 Minutes Intravenous Every 12 hours 01/02/17 2050 01/03/17 0641   01/03/17 0600  piperacillin-tazobactam (ZOSYN) IVPB 3.375 g  Status:  Discontinued     3.375 g 12.5 mL/hr over 240 Minutes Intravenous Every 8 hours 01/02/17 2123 01/03/17 0641   01/03/17 0330  cefTRIAXone (ROCEPHIN) 2 g in dextrose 5 % 50 mL IVPB  Status:  Discontinued     2 g 100 mL/hr over 30 Minutes Intravenous Daily at bedtime 01/03/17 0317 01/03/17 0746   01/02/17 2200  piperacillin-tazobactam (ZOSYN) IVPB 3.375 g  Status:  Discontinued     3.375 g 100 mL/hr over 30 Minutes Intravenous  Once 01/02/17 2123 01/03/17 0641    01/02/17 2100  vancomycin (VANCOCIN) IVPB 1000 mg/200 mL premix     1,000 mg 200 mL/hr over 60 Minutes Intravenous  Once 01/02/17 2050 01/02/17 2330   01/02/17 2015  piperacillin-tazobactam (ZOSYN) IVPB 3.375 g     3.375 g 12.5 mL/hr over 240 Minutes Intravenous  Once 01/02/17 2009 01/02/17 2113        Objective:   Vitals:   01/03/17 1436 01/03/17 2140 01/04/17 0500 01/04/17 0616  BP: (!) 102/48 114/88  101/60  Pulse: 78 87  86  Resp: 16 18  18   Temp: 97.6 F (36.4 C) 97.4 F (36.3 C)  97.4 F (36.3 C)  TempSrc: Oral Oral  Oral  SpO2: 97% 98%  99%  Weight:   79.5 kg (175 lb 5 oz)   Height:        Wt Readings from Last 3 Encounters:  01/04/17 79.5 kg (175 lb 5 oz)  12/24/16 66.7 kg (147 lb 1.6 oz)  11/24/16 74.5 kg (164 lb 3.2 oz)     Intake/Output Summary (Last 24 hours) at 01/04/17 1226 Last data filed at 01/04/17 0900  Gross per 24 hour  Intake           2712.5 ml  Output                0 ml  Net           2712.5 ml     Physical Exam  Awake Alert, Oriented X 3, Extremely anxious Supple Neck,No JVD,  Symmetrical Chest wall movement, Good air movement bilaterally, CTAB RRR,No Gallops,Rubs or new Murmurs,e +ve B. mildly distended, tender to palpation at the incision areas, but incision site with no drainage infection or , patient with ecchymosis and right lower flank, lateral hip, lower abdomen area , not  warm or swollen to indicate cellulitis ., No rebound - guarding or rigidity. No Cyanosis, Clubbing , right lower extremity +1 edema    Data Review:    CBC  Recent Labs Lab 12/29/16 0448 01/02/17 2022 01/03/17 0530 01/04/17 0434  WBC 6.9 10.8* 8.8 10.9*  HGB 9.3* 8.8* 8.4* 8.3*  HCT 27.0* 24.7* 25.1* 23.7*  PLT 130* 117* 104* 130*  MCV 96.8 95.7 95.1 97.9  MCH 33.3 34.1* 31.8 34.3*  MCHC 34.4 35.6 33.5 35.0  RDW 21.4* 21.1* 21.4* 22.0*  LYMPHSABS  --  1.7  --   --   MONOABS  --  0.8  --   --   EOSABS  --  0.1  --   --   BASOSABS  --  0.0  --    --     Chemistries   Recent Labs Lab 12/29/16 0448 01/02/17 2022 01/03/17 0530 01/04/17 0434  NA 133* 136 136 139  K 3.6 2.9* 4.2 3.1*  CL 107 103 106 108  CO2 20* 20* 18* 21*  GLUCOSE 92 95 157* 107*  BUN <5* 7 5* 8  CREATININE 0.68 0.79 0.74 0.71  CALCIUM 8.5* 9.2 8.0* 8.2*  MG  --  1.6* 2.7*  --   AST  --  50* 48* 51*  ALT  --  16 18 18   ALKPHOS  --  165* 152* 144*  BILITOT  --  2.0* 1.9* 1.2   ------------------------------------------------------------------------------------------------------------------ No results for input(s): CHOL, HDL, LDLCALC, TRIG, CHOLHDL, LDLDIRECT in the last 72 hours.  Lab Results  Component Value Date   HGBA1C 5.3 05/19/2016   ------------------------------------------------------------------------------------------------------------------  Recent Labs  01/04/17 0434  TSH 1.004   ------------------------------------------------------------------------------------------------------------------ No results for input(s): VITAMINB12, FOLATE, FERRITIN, TIBC, IRON, RETICCTPCT in the last 72 hours.  Coagulation profile  Recent Labs Lab 01/02/17 2022  INR 1.51    No results for input(s): DDIMER in the last 72 hours.  Cardiac Enzymes No results for input(s): CKMB, TROPONINI, MYOGLOBIN in the last 168 hours.  Invalid input(s): CK ------------------------------------------------------------------------------------------------------------------    Component Value Date/Time   BNP 84.0 01/02/2017 2022   BNP 50.2 07/23/2016 1439    Inpatient Medications  Scheduled Meds: . estradiol  1 mg Oral Daily  . gabapentin  300 mg Oral QHS  . levothyroxine  75 mcg Oral QAC breakfast  . lip balm  1 application Topical BID  . multivitamins with iron  1 tablet Oral Daily  . potassium chloride  40 mEq Oral BID  . pregabalin  50 mg Oral Daily  . [START ON 01/05/2017] psyllium  1 packet Oral Daily  . saccharomyces boulardii  250 mg Oral BID    . sodium chloride flush  3 mL Intravenous Q12H   Continuous Infusions: PRN Meds:.sodium chloride, acetaminophen, acetaminophen, alum & mag hydroxide-simeth, bisacodyl, diazepam, diphenhydrAMINE, hydrALAZINE, HYDROmorphone (DILAUDID) injection, lactated ringers, lactated ringers, magic mouthwash, menthol-cetylpyridinium, methocarbamol (ROBAXIN)  IV, methocarbamol, metoprolol, ondansetron (ZOFRAN) IV **OR** ondansetron (ZOFRAN) IV, ondansetron, oxyCODONE, phenol, polyethylene glycol, prochlorperazine, sodium chloride flush  Micro Results No results found for this or any previous visit (from the past 240 hour(s)).  Radiology Reports Ct Abdomen Pelvis Wo Contrast  Result Date: 12/08/2016 CLINICAL DATA:  46 y/o F; 2 weeks of abdominal pain mostly on the right. EXAM: CT ABDOMEN AND PELVIS WITHOUT CONTRAST TECHNIQUE: Multidetector CT imaging of the abdomen and pelvis was performed following the standard protocol without IV contrast. COMPARISON:  None. FINDINGS: Lower chest: No acute abnormality. Hepatobiliary: Hepatomegaly with  the right hepatic lobe measuring up to 21 cm cranial caudal. Hepatic steatosis. Normal gallbladder. No intra or extrahepatic biliary ductal dilatation. Pancreas: Unremarkable. No pancreatic ductal dilatation or surrounding inflammatory changes. Spleen: Splenomegaly, spleen volume proximal 550 cc. Adrenals/Urinary Tract: Left kidney atrophy. No mass, urinary stone disease, or obstructive uropathy identified. Normal bladder. Stomach/Bowel: Stomach is within normal limits. Appendix appears normal. No evidence of bowel wall thickening, distention, or inflammatory changes. Vascular/Lymphatic: No significant vascular findings are present. No enlarged abdominal or pelvic lymph nodes. Reproductive: Uterus and bilateral adnexa are unremarkable. Other: There is fat stranding within the mesenteric in the right lower quadrant without a discrete fluid collection (series 2, image 48). Centered within  the area of edema and is a focus of fat attenuation with peripheral soft tissue density (series 5, image 87). No ascites. Musculoskeletal: No acute or significant osseous findings. IMPRESSION: 1. Nonspecific fat stranding within the mesentery of the right lower quadrant with findings suggestive of a fat infarct. Differential includes an underlying infectious/inflammatory process or edema related to liver disease / third spacing. Adjacent loops of small bowel and the appendix appear normal. No discrete fluid collection or mass identified. 2. Hepatosplenomegaly.  Hepatic steatosis. Electronically Signed   By: Kristine Garbe M.D.   On: 12/08/2016 19:18   Dg Cholangiogram Operative  Result Date: 12/25/2016 CLINICAL DATA:  46 year old female with a history of cholelithiasis EXAM: INTRAOPERATIVE CHOLANGIOGRAM TECHNIQUE: Cholangiographic images from the C-arm fluoroscopic device were submitted for interpretation post-operatively. Please see the procedural report for the amount of contrast and the fluoroscopy time utilized. COMPARISON:  MR 12/24/2016 FINDINGS: Surgical instruments project over the upper abdomen. There is cannulation of the cystic duct/gallbladder neck, with antegrade infusion of contrast. Caliber of the extrahepatic ductal system within normal limits. No large filling defect identified. Free flow of contrast across the ampulla. IMPRESSION: Intraoperative cholangiogram demonstrates extrahepatic biliary ducts of unremarkable caliber, with no large filling defect identified. Free flow of contrast across the ampulla. Please refer to the dictated operative report for full details of intraoperative findings and procedure Electronically Signed   By: Corrie Mckusick D.O.   On: 12/25/2016 15:37   Ct Abdomen Pelvis W Contrast  Result Date: 01/03/2017 CLINICAL DATA:  Cholecystectomy 1 week ago. Abdominal distention and leg pain for 4 days. Generalized abdominal pain. Weight loss over 1 month. Unable to  urinate. Low grade fevers tonight. Patient received 4 hour premedication prior to scanning. No contrast reaction reported. EXAM: CT ABDOMEN AND PELVIS WITH CONTRAST TECHNIQUE: Multidetector CT imaging of the abdomen and pelvis was performed using the standard protocol following bolus administration of intravenous contrast. CONTRAST:  185m ISOVUE-300 IOPAMIDOL (ISOVUE-300) INJECTION 61% COMPARISON:  MRI abdomen 12/24/2016. CT abdomen and pelvis 12/14/2016 FINDINGS: Lower chest: Atelectasis in the lung bases. Hepatobiliary: Postoperative cholecystectomy. There is gas and fluid in the gallbladder fossa. This appearance is consistent with Surgicel if this was used at the time of surgery. Correlation with surgical history is recommended. If no Surgicel was used, this could indicate an abscess. There is mild infiltration in the fat of the right upper quadrant, possibly postoperative or may be indicating inflammatory infiltration. No discrete loculated fluid collection is otherwise indicated. No significant free fluid to suggest a bowel leak. No focal liver lesions. Pancreas: Unremarkable. No pancreatic ductal dilatation or surrounding inflammatory changes. Spleen: Normal in size without focal abnormality. Adrenals/Urinary Tract: No adrenal gland nodules. The left kidney is atrophic. Nephrograms are symmetrical. No hydronephrosis or hydroureter. Bladder wall is not thickened.  Stomach/Bowel: Stomach and small bowel are not abnormally distended and no wall thickening is appreciated. Colon is diffusely fluid-filled without distention. There is suggestion of wall thickening in the cecum and hepatic flexure. This could represent colitis or may represent reactive inflammatory change. Appendix is normal. Vascular/Lymphatic: No significant vascular findings are present. No enlarged abdominal or pelvic lymph nodes. Reproductive: Uterus and bilateral adnexa are unremarkable. Other: Small amount of free fluid in the pelvis.  Infiltration in the subcutaneous fat of the abdominal wall may represent edema or cellulitis. Small periumbilical hernia containing fat. No significant free air in the abdomen. Musculoskeletal: No acute or significant osseous findings. IMPRESSION: Fluid and gas in the gallbladder fossa post cholecystectomy. This could be due to Surgicel (if used at surgery) or abscess. Correlation with surgical history needed. Infiltration in the RUQ fat possibly inflammatory. Mild wall thickening of hepatic flexure and right colon may indicate colitis or may be reactive. No significant free fluid or free air. Minimal pelvic free fluid. Edema in the subcutaneous fat. Called report to Dr. Ellender Hose at [phone] hours on 01/03/17. Electronically Signed   By: Lucienne Capers M.D.   On: 01/03/2017 06:28   Mr 3d Recon At Scanner  Result Date: 12/25/2016 CLINICAL DATA:  Chronic nausea, vomiting common right abdominal pain with chronically abnormal LFTs. EXAM: MRI ABDOMEN WITHOUT AND WITH CONTRAST (INCLUDING MRCP) TECHNIQUE: Multiplanar multisequence MR imaging of the abdomen was performed both before and after the administration of intravenous contrast. Heavily T2-weighted images of the biliary and pancreatic ducts were obtained, and three-dimensional MRCP images were rendered by post processing. CONTRAST:  33m MULTIHANCE GADOBENATE DIMEGLUMINE 529 MG/ML IV SOLN COMPARISON:  CT scan 12/14/2016 FINDINGS: Lower chest:  Unremarkable. Hepatobiliary: Loss of signal within the hepatic parenchyma on out of phase T1 weighted imaging is compatible with fatty deposition. The liver remains enlarged at 21.5 cm craniocaudal length.Probable small volume sludge dependently in the gallbladder lumen without discrete stones evident. No gallbladder wall thickening or pericholecystic fluid. No intra or extrahepatic biliary duct dilatation. Extrahepatic common duct measures 6 mm diameter. Common bile duct in the head of the pancreas is 5 mm diameter. Pancreas: No  focal mass lesion. No dilatation of the main duct. No intraparenchymal cyst. No peripancreatic edema. Spleen: Spleen is 15.5 cm cranial caudal length. No focal mass lesion. Adrenals/Urinary Tract: No adrenal nodule or mass. Left kidney is atrophic with areas of focal cortical scarring. Right kidney unremarkable. Stomach/Bowel: Stomach is nondistended. No gastric wall thickening. No evidence of outlet obstruction. Duodenum is normally positioned as is the ligament of Treitz. No small bowel or colonic dilatation within the visualized abdomen. Vascular/Lymphatic: No abdominal aortic aneurysm. No abdominal lymphadenopathy. Other: No intraperitoneal free fluid. Musculoskeletal: No abnormal marrow enhancement within the visualized bony anatomy. IMPRESSION: 1. Hepatic steatosis with hepatomegaly. 2. No evidence of gallstones. No gallbladder wall thickening or pericholecystic fluid. No biliary ductal dilatation. No choledocholithiasis. 3. Spleen upper limits of normal for size to borderline enlarged. Electronically Signed   By: EMisty StanleyM.D.   On: 12/25/2016 07:16   Dg Abd Acute W/chest  Result Date: 12/20/2016 CLINICAL DATA:  Pt c/o lower and right sided abdominal pain since Feb. 12th, as well as vomiting, diarrhea, and fever. H/o C-diff, gallbladder disease, and HTN. Smoker. EXAM: DG ABDOMEN ACUTE W/ 1V CHEST COMPARISON:  Acute abdomen series dated 06/24/2015. FINDINGS: Single view of the chest: New streaky opacities at each lung base, most likely mild atelectasis. Lungs otherwise clear. Lung volumes are normal. No  pleural effusion or pneumothorax seen. Heart size and mediastinal contours are normal. Osseous structures about the chest are unremarkable. Upright and supine views of the abdomen: Bowel gas pattern is nonobstructive. No evidence of soft tissue mass or abnormal fluid collection. No evidence of free intraperitoneal air. Osseous structures of the abdomen and pelvis are unremarkable. Small calcifications  within the lower pelvis are likely benign vascular phleboliths. IMPRESSION: 1. Probable mild bibasilar atelectasis. Lungs otherwise clear. No evidence of pneumonia or pulmonary edema. 2. Nonobstructive bowel gas pattern and no evidence of acute intra-abdominal abnormality. Electronically Signed   By: Franki Cabot M.D.   On: 12/20/2016 15:16   Mr Abdomen Mrcp Moise Boring Contast  Result Date: 12/25/2016 CLINICAL DATA:  Chronic nausea, vomiting common right abdominal pain with chronically abnormal LFTs. EXAM: MRI ABDOMEN WITHOUT AND WITH CONTRAST (INCLUDING MRCP) TECHNIQUE: Multiplanar multisequence MR imaging of the abdomen was performed both before and after the administration of intravenous contrast. Heavily T2-weighted images of the biliary and pancreatic ducts were obtained, and three-dimensional MRCP images were rendered by post processing. CONTRAST:  46m MULTIHANCE GADOBENATE DIMEGLUMINE 529 MG/ML IV SOLN COMPARISON:  CT scan 12/14/2016 FINDINGS: Lower chest:  Unremarkable. Hepatobiliary: Loss of signal within the hepatic parenchyma on out of phase T1 weighted imaging is compatible with fatty deposition. The liver remains enlarged at 21.5 cm craniocaudal length.Probable small volume sludge dependently in the gallbladder lumen without discrete stones evident. No gallbladder wall thickening or pericholecystic fluid. No intra or extrahepatic biliary duct dilatation. Extrahepatic common duct measures 6 mm diameter. Common bile duct in the head of the pancreas is 5 mm diameter. Pancreas: No focal mass lesion. No dilatation of the main duct. No intraparenchymal cyst. No peripancreatic edema. Spleen: Spleen is 15.5 cm cranial caudal length. No focal mass lesion. Adrenals/Urinary Tract: No adrenal nodule or mass. Left kidney is atrophic with areas of focal cortical scarring. Right kidney unremarkable. Stomach/Bowel: Stomach is nondistended. No gastric wall thickening. No evidence of outlet obstruction. Duodenum is  normally positioned as is the ligament of Treitz. No small bowel or colonic dilatation within the visualized abdomen. Vascular/Lymphatic: No abdominal aortic aneurysm. No abdominal lymphadenopathy. Other: No intraperitoneal free fluid. Musculoskeletal: No abnormal marrow enhancement within the visualized bony anatomy. IMPRESSION: 1. Hepatic steatosis with hepatomegaly. 2. No evidence of gallstones. No gallbladder wall thickening or pericholecystic fluid. No biliary ductal dilatation. No choledocholithiasis. 3. Spleen upper limits of normal for size to borderline enlarged. Electronically Signed   By: EMisty StanleyM.D.   On: 12/25/2016 07:16   Ct Angio Abd/pel W And/or Wo Contrast  Result Date: 12/14/2016 CLINICAL DATA:  Chronic escalating abdominal pain for 2 weeks with nausea and vomiting. EXAM: CT ANGIOGRAPHY ABDOMEN AND PELVIS TECHNIQUE: Multidetector CT imaging of the abdomen and pelvis was performed using the standard protocol during bolus administration of intravenous contrast. Multiplanar reconstructed images including MIPs were obtained and reviewed to evaluate the vascular anatomy. CONTRAST:  80 cc Isovue 370 COMPARISON:  12/08/2016 CT without contrast FINDINGS: Arterial findings: Aorta: Intact abdominal aorta. No significant atherosclerotic change. No occlusion, dissection, aneurysm, or retroperitoneal hemorrhage. Celiac axis: Widely patent origin. Splenic, left gastric, hepatic branches are patent. Superior mesenteric:  Widely patent origin. Left renal: Minor origin atherosclerosis. Minimal ostial narrowing without stenosis. Accessory left renal artery to the lower pole also patent. Right renal:          Widely patent. Inferior mesenteric:  Widely patent. Left iliac: Left common, internal and external iliac arteries are patent. No  inflow disease. Right iliac: Right common, internal and external iliac arteries are patent. No inflow disease. Venous findings: Hepatic, portal, splenic, and mesenteric  veins are patent. No veno-occlusive process. IVC, and renal veins appear patent. Limited opacification of the iliac and femoral veins. Review of the MIP images confirms the above findings. Nonvascular findings: Bibasilar atelectasis. No significant pleural effusion. No pericardial effusion. Normal heart size. Hepatomegaly evident. Liver measures 23 cm in length. No focal hepatic abnormality or biliary dilatation. Trace amount of pericholecystic fluid, nonspecific. Difficult to exclude mild cholecystitis. Consider further evaluation with ultrasound. Spleen is enlarged measuring 16 cm in length. No focal splenic abnormality. Pancreas demonstrates normal and without acute process. Normal appearing adrenal glands. Kidneys demonstrate no acute obstruction. Left kidney demonstrates upper pole cortical atrophy appearing chronic. No renal obstruction or hydronephrosis. No obstructing ureteral calculus. No bladder abnormality. Negative bowel obstruction, significant dilatation, ileus, or free air. Appendix is unremarkable without distention or inflammation. No fluid collection or abscess. Trace pelvic free fluid, suspect physiologic. Uterus and adnexal normal in size. No pelvic fluid collection or hemorrhage. No inguinal abnormality or adenopathy.  Intact abdominal wall. No acute osseous finding. IMPRESSION: Patent mesenteric and renal vasculature. Minor abdominal aortic atherosclerosis without acute vascular process. No evidence of mesenteric or renal vascular occlusive process. Hepatosplenomegaly. Bibasilar atelectasis Trace pericholecystic fluid and slight gallbladder distention, nonspecific but difficult to exclude cholecystitis. Recommend correlation with exam and consider right upper quadrant ultrasound. Electronically Signed   By: Jerilynn Mages.  Shick M.D.   On: 12/14/2016 15:15   US Abdomen Limited Ruq  Result Date: 12/20/2016 CLINICAL DATA:  Abdominal pain since December 01, 2016 EXAM: US ABDOMEN LIMITED - RIGHT UPPER  QUADRANT COMPARISON:  12/14/2016 FINDINGS: Gallbladder: Layering sludge. No shadowing gallstone noted. No sonographic Murphy sign or inflammatory wall thickening. Common bile duct: Diameter: 5-6 mm.  Where visualized, no filling defect. Liver: Echogenic compatible with steatosis. No evidence of mass lesion. Antegrade flow in the main portal vein. IMPRESSION: 1. Sludge without shadowing stone.  No signs of cholecystitis. 2. Hepatic steatosis. Electronically Signed   By: Monte Fantasia M.D.   On: 12/20/2016 17:19   US Abdomen Limited Ruq  Result Date: 12/14/2016 CLINICAL DATA:  Right upper quadrant pain for 3 years. EXAM: US ABDOMEN LIMITED - RIGHT UPPER QUADRANT COMPARISON:  CT, 12/14/2016 at 1437 hours FINDINGS: Gallbladder: Gallbladder is distended. There is dependent sludge, but no shadowing stones. Wall is not thickened, but there is mild pericholecystic fluid between the gallbladder and liver. No sonographic Murphy's sign. Common bile duct: Diameter: 4.5 mm Liver: Mildly enlarged with heterogeneous increased echogenicity and an overall coarsened echotexture. No discrete liver mass or focal lesion. IMPRESSION: 1. No evidence of acute cholecystitis.  No bile duct dilation. 2. Gallbladder sludge but no shadowing stones. 3. Pericholecystic edema seen between the gallbladder and liver. This is likely on the basis of liver pathology. 4. Hepatomegaly. Sonographic appearance of the liver suggests heterogeneous fatty infiltration, cirrhosis or a combination. Electronically Signed   By: Lajean Manes M.D.   On: 12/14/2016 17:03     Arturo Freundlich M.D on 01/04/2017 at 12:26 PM  Between 7am to 7pm - Pager - [phone]  After 7pm go to [email or web address] - password [facility]  [facility] -  Office  [phone]

## 2017-01-04 NOTE — Evaluation (Signed)
Physical Therapy Evaluation Patient Details Name: Neeya Prigmore MRN: [id number] DOB: 09/03/1971 Today's Date: 01/04/2017   History of Present Illness  Pt admitted with abdominal pain and cellulites, s/p lap chole on 12/25/16. PMH: B LE fxs, obesity, anemia, peripheral neuropathy, cirrhosis, ETOH abuse, anxiety and depression, PTSD.    Clinical Impression  Pt admitted with above diagnosis. Pt currently with functional limitations due to the deficits listed below (see PT Problem List).  Pt will benefit from skilled PT to increase their independence and safety with mobility to allow discharge to the venue listed below.  Pt able to ambulate with IV pole with min/guard with c/o fluctuating abd pain. Pt has 24 hour S from boyfriend and mother.  Will follow acutely, but no follow up PT needs at this time.     Follow Up Recommendations No PT follow up;Supervision for mobility/OOB    Equipment Recommendations  None recommended by PT    Recommendations for Other Services       Precautions / Restrictions Precautions Precautions: Fall Restrictions Weight Bearing Restrictions: No      Mobility  Bed Mobility               General bed mobility comments: sitting up on bench by window  Transfers Overall transfer level: Needs assistance Equipment used: None Transfers: Sit to/from Stand Sit to Stand: Min guard         General transfer comment: min/guard from bench and toilet  Ambulation/Gait Ambulation/Gait assistance: Min guard Ambulation Distance (Feet): 350 Feet Assistive device:  (IV pole) Gait Pattern/deviations: Step-through pattern;Staggering left;Staggering right     General Gait Details: Pt amb with IV pole in room with staggering at times which improved as gait progressed in hallway.  2 standing rest breaks due to abd pain. Cues for breathing and relaxation  Stairs            Wheelchair Mobility    Modified Rankin (Stroke Patients Only)        Balance Overall balance assessment: Needs assistance           Standing balance-Leahy Scale: Fair Standing balance comment: fair static and poor dynamic                             Pertinent Vitals/Pain Pain Assessment: Faces Faces Pain Scale: Hurts whole lot Pain Location: abdomen Pain Descriptors / Indicators: Moaning Pain Intervention(s): Limited activity within patient's tolerance;Monitored during session    [place] expects to be discharged to:: Private residence Living Arrangements: Spouse/significant other (boyfriend) Available Help at Discharge: Family;Available 24 hours/day Type of Home: Apartment Home Access: Stairs to enter Entrance Stairs-Rails: Right;Left Entrance Stairs-Number of Steps: flight Home Layout: One level Home Equipment: Shower seat;Cane - single point;Wheelchair - [employer] - 2 wheels;Grab bars - tub/shower Additional Comments: mom is also staying with pt    Prior Function Level of Independence: Needs assistance   Gait / Transfers Assistance Needed: walking with walker outside of apartment.  In apartment, furniture cruises. someone is always with her on the stairs  ADL's / Homemaking Assistance Needed: assist for tub transfer for shoes and socks, assist for meal prep, housekeeping  Comments: per boyfriend, pt has not been able to qualify for disability     Hand Dominance   Dominant Hand: Right    Extremity/Trunk Assessment   Upper Extremity Assessment Upper Extremity Assessment: Defer to OT evaluation    Lower Extremity Assessment Lower Extremity Assessment:  Overall WFL for tasks assessed       Communication   Communication: No difficulties  Cognition Arousal/Alertness: Awake/alert Behavior During Therapy: Flat affect Overall Cognitive Status: Impaired/Different from baseline Area of Impairment: Attention   Current Attention Level: Sustained           General Comments: pt easily  distracted    General Comments      Exercises     Assessment/Plan    PT Assessment Patient needs continued PT services  PT Problem List Decreased activity tolerance;Decreased balance;Decreased mobility;Decreased safety awareness       PT Treatment Interventions DME instruction;Gait training;Functional mobility training;Therapeutic activities;Therapeutic exercise;Balance training;Patient/family education    PT Goals (Current goals can be found in the Care Plan section)  Acute Rehab PT Goals Patient Stated Goal: abd pain to go away  PT Goal Formulation: With patient Time For Goal Achievement: 01/04/17 Potential to Achieve Goals: Good    Frequency Min 3X/week   Barriers to discharge        Co-evaluation               End of Session Equipment Utilized During Treatment: Gait belt Activity Tolerance: Patient tolerated treatment well Patient left: Other (comment);with call bell/phone within reach;with family/visitor present (on bench by window)   PT Visit Diagnosis: Unsteadiness on feet (R26.81);Difficulty in walking, not elsewhere classified (R26.2)         Time: 4627-0350 PT Time Calculation (min) (ACUTE ONLY): 26 min   Charges:   PT Evaluation $PT Eval Moderate Complexity: 1 Procedure PT Treatments $Gait Training: 8-22 mins   PT G Codes:         Braiden Rodman LUBECK 01/04/2017, 5:36 PM

## 2017-01-04 NOTE — Progress Notes (Signed)
[place]  [address] [place], [place] [address] Phone: [phone] FAX: Tiger [phone] 1971/05/07    Problem List:   Active Problems:   Cirrhosis ([place])   Anxiety and depression   Hypokalemia   Alcohol dependence ([place])   Neuropathic pain, leg, bilateral   Hypothyroidism   Chronic cholecystitis with calculus s/p lap cholecystectomy 12/25/2016   Hepatosplenomegaly   GERD (gastroesophageal reflux disease)   Obesity (BMI 30-39.9)   Abdominal wall cellulitis   Hypomagnesemia           Assessment  Gradually recovering  Plan:  -adv diet to solids -wean off IVF -no evid DVT by duplex -LFTs improving w h/o cirrhosis -Postoperative pannicular and flank and thigh ecchymosis stabilizing. -Acute on chronic anemia stable. -doubt cellulitis - consider stopping ABx - defer to primary -bowel regimen - dec psyllium with loose BMs -anxiolysis -VTE prophylaxis- SCDs, etc -mobilize as tolerated to help recovery  Adin Hector, M.D., F.A.C.S. Gastrointestinal and Minimally Invasive Surgery [facility] [address], [place], [place] [address] [phone] Main / Paging   01/04/2017  CARE TEAM:  PCP: Minerva Ends, MD  Outpatient Care Team: Patient Care Team: Boykin Nearing, MD as PCP - General (Family Medicine) Danie Binder, MD as Consulting Physician (Gastroenterology) Autumn Messing III, MD as Consulting Physician (General Surgery) Manus Gunning, MD as Consulting Physician (Gastroenterology)  Inpatient Treatment Team: Treatment Team: Attending Provider: Albertine Patricia, MD; Consulting Physician: Md Edison Pace, MD; Technician: Abbe Amsterdam, NT; Rounding Team: Ian Bushman, MD; Physical Therapist: Galen Manila, PT; Technician: Etheleen Sia, NT  Subjective:  Family in room Having loose BMs No emesis overnight - less nausea Chronic  abd/joint pain  Objective:  Vital signs:  Vitals:   01/03/17 1436 01/03/17 2140 01/04/17 0500 01/04/17 0616  BP: (!) 102/48 114/88  101/60  Pulse: 78 87  86  Resp: 16 18  18   Temp: 97.6 F (36.4 C) 97.4 F (36.3 C)  97.4 F (36.3 C)  TempSrc: Oral Oral  Oral  SpO2: 97% 98%  99%  Weight:   79.5 kg (175 lb 5 oz)   Height:        Last BM Date: 01/03/17  Intake/Output   Yesterday:  03/17 0701 - 03/18 0700 In: 3472.5 [P.O.:1360; I.V.:2062.5; IV Piggyback:50] Out: -  This shift:  Total I/O In: 240 [P.O.:240] Out: -   Bowel function:  Flatus: YES  BM:  YES  Drain: (No drain)   Physical Exam:  General: Pt awake/alert/oriented x4 in No acute distress Eyes: PERRL, normal EOM.  Sclera clear.  No icterus Neuro: CN II-XII intact w/o focal sensory/motor deficits. Lymph: No head/neck/groin lymphadenopathy Psych:  No delerium/psychosis/paranoia.  Occasional pressured speech talking pass me and interrupting often.  But pleasant.  Not agitated.  Not tearful today.  Smiling. HENT: Normocephalic, Mucus membranes moist.  No thrush Neck: Supple, No tracheal deviation Chest: No chest wall pain w good excursion CV:  Pulses intact.  Regular rhythm MS: Normal AROM mjr joints.  No obvious deformity Abdomen: Somewhat firm.  Mildy distended.  Mildly tender at incisions only.  Obese.  Ecchymosis infraumbilical involving panniculus stable.  No erythema or cellulitis.  Ecchymosis on right flank to right posterior thigh less.  No evidence of peritonitis.  No incarcerated hernias. Ext:  SCDs BLE.  No asymmetry.  Ecchymosis on posterior thigh improved.  No mjr edema.  No cyanosis Skin: No petechiae / purpura  Results:   Labs: Results for orders placed or performed during the hospital encounter of 01/02/17 (from the past 48 hour(s))  CBC with Differential     Status: Abnormal   Collection Time: 01/02/17  8:22 PM  Result Value Ref Range   WBC 10.8 (H) 4.0 - 10.5 K/uL   RBC 2.58 (L) 3.87 -  5.11 MIL/uL   Hemoglobin 8.8 (L) 12.0 - 15.0 g/dL   HCT 24.7 (L) 36.0 - 46.0 %   MCV 95.7 78.0 - 100.0 fL   MCH 34.1 (H) 26.0 - 34.0 pg   MCHC 35.6 30.0 - 36.0 g/dL   RDW 21.1 (H) 11.5 - 15.5 %   Platelets 117 (L) 150 - 400 K/uL    Comment: SPECIMEN CHECKED FOR CLOTS PLATELET COUNT CONFIRMED BY SMEAR REPEATED TO VERIFY    Neutrophils Relative % 76 %   Lymphocytes Relative 16 %   Monocytes Relative 7 %   Eosinophils Relative 1 %   Basophils Relative 0 %   Neutro Abs 8.2 (H) 1.7 - 7.7 K/uL   Lymphs Abs 1.7 0.7 - 4.0 K/uL   Monocytes Absolute 0.8 0.1 - 1.0 K/uL   Eosinophils Absolute 0.1 0.0 - 0.7 K/uL   Basophils Absolute 0.0 0.0 - 0.1 K/uL   RBC Morphology POLYCHROMASIA PRESENT     Comment: TEARDROP CELLS  Comprehensive metabolic panel     Status: Abnormal   Collection Time: 01/02/17  8:22 PM  Result Value Ref Range   Sodium 136 135 - 145 mmol/L   Potassium 2.9 (L) 3.5 - 5.1 mmol/L   Chloride 103 101 - 111 mmol/L   CO2 20 (L) 22 - 32 mmol/L   Glucose, Bld 95 65 - 99 mg/dL   BUN 7 6 - 20 mg/dL   Creatinine, Ser 0.79 0.44 - 1.00 mg/dL   Calcium 9.2 8.9 - 10.3 mg/dL   Total Protein 6.9 6.5 - 8.1 g/dL   Albumin 3.1 (L) 3.5 - 5.0 g/dL   AST 50 (H) 15 - 41 U/L   ALT 16 14 - 54 U/L   Alkaline Phosphatase 165 (H) 38 - 126 U/L   Total Bilirubin 2.0 (H) 0.3 - 1.2 mg/dL   GFR calc non Af Amer >60 >60 mL/min   GFR calc Af Amer >60 >60 mL/min    Comment: (NOTE) The eGFR has been calculated using the CKD EPI equation. This calculation has not been validated in all clinical situations. eGFR's persistently <60 mL/min signify possible Chronic Kidney Disease.    Anion gap 13 5 - 15  Lipase, blood     Status: Abnormal   Collection Time: 01/02/17  8:22 PM  Result Value Ref Range   Lipase 56 (H) 11 - 51 U/L  Brain natriuretic peptide     Status: None   Collection Time: 01/02/17  8:22 PM  Result Value Ref Range   B Natriuretic Peptide 84.0 0.0 - 100.0 pg/mL  Protime-INR     Status:  Abnormal   Collection Time: 01/02/17  8:22 PM  Result Value Ref Range   Prothrombin Time 18.4 (H) 11.4 - 15.2 seconds   INR 1.51   Magnesium     Status: Abnormal   Collection Time: 01/02/17  8:22 PM  Result Value Ref Range   Magnesium 1.6 (L) 1.7 - 2.4 mg/dL  I-Stat CG4 Lactic Acid, ED     Status: None   Collection Time: 01/02/17  9:12 PM  Result  Value Ref Range   Lactic Acid, Venous 1.62 0.5 - 1.9 mmol/L  Urinalysis, Routine w reflex microscopic     Status: Abnormal   Collection Time: 01/03/17 12:03 AM  Result Value Ref Range   Color, Urine YELLOW YELLOW   APPearance CLEAR CLEAR   Specific Gravity, Urine 1.005 1.005 - 1.030   pH 5.0 5.0 - 8.0   Glucose, UA NEGATIVE NEGATIVE mg/dL   Hgb urine dipstick MODERATE (A) NEGATIVE   Bilirubin Urine NEGATIVE NEGATIVE   Ketones, ur NEGATIVE NEGATIVE mg/dL   Protein, ur NEGATIVE NEGATIVE mg/dL   Nitrite NEGATIVE NEGATIVE   Leukocytes, UA SMALL (A) NEGATIVE   RBC / HPF 0-5 0 - 5 RBC/hpf   WBC, UA 6-30 0 - 5 WBC/hpf   Bacteria, UA RARE (A) NONE SEEN   Squamous Epithelial / LPF 0-5 (A) NONE SEEN   Mucous PRESENT   Ammonia     Status: Abnormal   Collection Time: 01/03/17  3:28 AM  Result Value Ref Range   Ammonia 39 (H) 9 - 35 umol/L  Comprehensive metabolic panel     Status: Abnormal   Collection Time: 01/03/17  5:30 AM  Result Value Ref Range   Sodium 136 135 - 145 mmol/L   Potassium 4.2 3.5 - 5.1 mmol/L    Comment: DELTA CHECK NOTED   Chloride 106 101 - 111 mmol/L   CO2 18 (L) 22 - 32 mmol/L   Glucose, Bld 157 (H) 65 - 99 mg/dL   BUN 5 (L) 6 - 20 mg/dL   Creatinine, Ser 0.74 0.44 - 1.00 mg/dL   Calcium 8.0 (L) 8.9 - 10.3 mg/dL   Total Protein 6.6 6.5 - 8.1 g/dL   Albumin 2.9 (L) 3.5 - 5.0 g/dL   AST 48 (H) 15 - 41 U/L   ALT 18 14 - 54 U/L   Alkaline Phosphatase 152 (H) 38 - 126 U/L   Total Bilirubin 1.9 (H) 0.3 - 1.2 mg/dL   GFR calc non Af Amer >60 >60 mL/min   GFR calc Af Amer >60 >60 mL/min    Comment: (NOTE) The eGFR  has been calculated using the CKD EPI equation. This calculation has not been validated in all clinical situations. eGFR's persistently <60 mL/min signify possible Chronic Kidney Disease.    Anion gap 12 5 - 15  CBC     Status: Abnormal   Collection Time: 01/03/17  5:30 AM  Result Value Ref Range   WBC 8.8 4.0 - 10.5 K/uL   RBC 2.64 (L) 3.87 - 5.11 MIL/uL   Hemoglobin 8.4 (L) 12.0 - 15.0 g/dL   HCT 25.1 (L) 36.0 - 46.0 %   MCV 95.1 78.0 - 100.0 fL   MCH 31.8 26.0 - 34.0 pg   MCHC 33.5 30.0 - 36.0 g/dL   RDW 21.4 (H) 11.5 - 15.5 %   Platelets 104 (L) 150 - 400 K/uL    Comment: CONSISTENT WITH PREVIOUS RESULT  Lipase, blood     Status: None   Collection Time: 01/03/17  5:30 AM  Result Value Ref Range   Lipase 28 11 - 51 U/L  Magnesium     Status: Abnormal   Collection Time: 01/03/17  5:30 AM  Result Value Ref Range   Magnesium 2.7 (H) 1.7 - 2.4 mg/dL  CBC     Status: Abnormal   Collection Time: 01/04/17  4:34 AM  Result Value Ref Range   WBC 10.9 (H) 4.0 - 10.5 K/uL   RBC  2.42 (L) 3.87 - 5.11 MIL/uL   Hemoglobin 8.3 (L) 12.0 - 15.0 g/dL   HCT 23.7 (L) 36.0 - 46.0 %   MCV 97.9 78.0 - 100.0 fL   MCH 34.3 (H) 26.0 - 34.0 pg   MCHC 35.0 30.0 - 36.0 g/dL   RDW 22.0 (H) 11.5 - 15.5 %   Platelets 130 (L) 150 - 400 K/uL  Comprehensive metabolic panel     Status: Abnormal   Collection Time: 01/04/17  4:34 AM  Result Value Ref Range   Sodium 139 135 - 145 mmol/L   Potassium 3.1 (L) 3.5 - 5.1 mmol/L    Comment: DELTA CHECK NOTED REPEATED TO VERIFY    Chloride 108 101 - 111 mmol/L   CO2 21 (L) 22 - 32 mmol/L   Glucose, Bld 107 (H) 65 - 99 mg/dL   BUN 8 6 - 20 mg/dL   Creatinine, Ser 0.71 0.44 - 1.00 mg/dL   Calcium 8.2 (L) 8.9 - 10.3 mg/dL   Total Protein 6.9 6.5 - 8.1 g/dL   Albumin 3.1 (L) 3.5 - 5.0 g/dL   AST 51 (H) 15 - 41 U/L   ALT 18 14 - 54 U/L   Alkaline Phosphatase 144 (H) 38 - 126 U/L   Total Bilirubin 1.2 0.3 - 1.2 mg/dL   GFR calc non Af Amer >60 >60 mL/min    GFR calc Af Amer >60 >60 mL/min    Comment: (NOTE) The eGFR has been calculated using the CKD EPI equation. This calculation has not been validated in all clinical situations. eGFR's persistently <60 mL/min signify possible Chronic Kidney Disease.    Anion gap 10 5 - 15  TSH     Status: None   Collection Time: 01/04/17  4:34 AM  Result Value Ref Range   TSH 1.004 0.350 - 4.500 uIU/mL    Comment: Performed by a 3rd Generation assay with a functional sensitivity of <=0.01 uIU/mL.    Imaging / Studies: Ct Abdomen Pelvis W Contrast  Result Date: 01/03/2017 CLINICAL DATA:  Cholecystectomy 1 week ago. Abdominal distention and leg pain for 4 days. Generalized abdominal pain. Weight loss over 1 month. Unable to urinate. Low grade fevers tonight. Patient received 4 hour premedication prior to scanning. No contrast reaction reported. EXAM: CT ABDOMEN AND PELVIS WITH CONTRAST TECHNIQUE: Multidetector CT imaging of the abdomen and pelvis was performed using the standard protocol following bolus administration of intravenous contrast. CONTRAST:  123m ISOVUE-300 IOPAMIDOL (ISOVUE-300) INJECTION 61% COMPARISON:  MRI abdomen 12/24/2016. CT abdomen and pelvis 12/14/2016 FINDINGS: Lower chest: Atelectasis in the lung bases. Hepatobiliary: Postoperative cholecystectomy. There is gas and fluid in the gallbladder fossa. This appearance is consistent with Surgicel if this was used at the time of surgery. Correlation with surgical history is recommended. If no Surgicel was used, this could indicate an abscess. There is mild infiltration in the fat of the right upper quadrant, possibly postoperative or may be indicating inflammatory infiltration. No discrete loculated fluid collection is otherwise indicated. No significant free fluid to suggest a bowel leak. No focal liver lesions. Pancreas: Unremarkable. No pancreatic ductal dilatation or surrounding inflammatory changes. Spleen: Normal in size without focal  abnormality. Adrenals/Urinary Tract: No adrenal gland nodules. The left kidney is atrophic. Nephrograms are symmetrical. No hydronephrosis or hydroureter. Bladder wall is not thickened. Stomach/Bowel: Stomach and small bowel are not abnormally distended and no wall thickening is appreciated. Colon is diffusely fluid-filled without distention. There is suggestion of wall thickening in the cecum  and hepatic flexure. This could represent colitis or may represent reactive inflammatory change. Appendix is normal. Vascular/Lymphatic: No significant vascular findings are present. No enlarged abdominal or pelvic lymph nodes. Reproductive: Uterus and bilateral adnexa are unremarkable. Other: Small amount of free fluid in the pelvis. Infiltration in the subcutaneous fat of the abdominal wall may represent edema or cellulitis. Small periumbilical hernia containing fat. No significant free air in the abdomen. Musculoskeletal: No acute or significant osseous findings. IMPRESSION: Fluid and gas in the gallbladder fossa post cholecystectomy. This could be due to Surgicel (if used at surgery) or abscess. Correlation with surgical history needed. Infiltration in the RUQ fat possibly inflammatory. Mild wall thickening of hepatic flexure and right colon may indicate colitis or may be reactive. No significant free fluid or free air. Minimal pelvic free fluid. Edema in the subcutaneous fat. Called report to Dr. Ellender Hose at [phone] hours on 01/03/17. Electronically Signed   By: Lucienne Capers M.D.   On: 01/03/2017 06:28    Medications / Allergies: per chart  Antibiotics: Anti-infectives    Start     Dose/Rate Route Frequency Ordered Stop   01/03/17 1200  piperacillin-tazobactam (ZOSYN) IVPB 3.375 g    Comments:  Pharmacy may adjust dosing strength, schedule, rate of infusion, etc as needed to optimize therapy   3.375 g 12.5 mL/hr over 240 Minutes Intravenous Every 8 hours 01/03/17 0746     01/03/17 0800  vancomycin (VANCOCIN)  IVPB 750 mg/150 ml premix  Status:  Discontinued     750 mg 150 mL/hr over 60 Minutes Intravenous Every 12 hours 01/02/17 2050 01/03/17 0641   01/03/17 0600  piperacillin-tazobactam (ZOSYN) IVPB 3.375 g  Status:  Discontinued     3.375 g 12.5 mL/hr over 240 Minutes Intravenous Every 8 hours 01/02/17 2123 01/03/17 0641   01/03/17 0330  cefTRIAXone (ROCEPHIN) 2 g in dextrose 5 % 50 mL IVPB  Status:  Discontinued     2 g 100 mL/hr over 30 Minutes Intravenous Daily at bedtime 01/03/17 0317 01/03/17 0746   01/02/17 2200  piperacillin-tazobactam (ZOSYN) IVPB 3.375 g  Status:  Discontinued     3.375 g 100 mL/hr over 30 Minutes Intravenous  Once 01/02/17 2123 01/03/17 0641   01/02/17 2100  vancomycin (VANCOCIN) IVPB 1000 mg/200 mL premix     1,000 mg 200 mL/hr over 60 Minutes Intravenous  Once 01/02/17 2050 01/02/17 2330   01/02/17 2015  piperacillin-tazobactam (ZOSYN) IVPB 3.375 g     3.375 g 12.5 mL/hr over 240 Minutes Intravenous  Once 01/02/17 2009 01/02/17 2113        Note: Portions of this report may have been transcribed using voice recognition software. Every effort was made to ensure accuracy; however, inadvertent computerized transcription errors may be present.   Any transcriptional errors that result from this process are unintentional.     Adin Hector, M.D., F.A.C.S. Gastrointestinal and Minimally Invasive Surgery [facility] [address] [place], [place] [address] [phone] Main / Paging   01/04/2017

## 2017-01-05 ENCOUNTER — Inpatient Hospital Stay (HOSPITAL_COMMUNITY): Payer: Self-pay

## 2017-01-05 DIAGNOSIS — R197 Diarrhea, unspecified: Secondary | ICD-10-CM

## 2017-01-05 DIAGNOSIS — R233 Spontaneous ecchymoses: Secondary | ICD-10-CM

## 2017-01-05 LAB — CBC
HCT: 23.7 % — ABNORMAL LOW (ref 36.0–46.0)
Hemoglobin: 8.1 g/dL — ABNORMAL LOW (ref 12.0–15.0)
MCH: 33.9 pg (ref 26.0–34.0)
MCHC: 34.2 g/dL (ref 30.0–36.0)
MCV: 99.2 fL (ref 78.0–100.0)
PLATELETS: 133 10*3/uL — AB (ref 150–400)
RBC: 2.39 MIL/uL — AB (ref 3.87–5.11)
RDW: 22.3 % — ABNORMAL HIGH (ref 11.5–15.5)
WBC: 7.3 10*3/uL (ref 4.0–10.5)

## 2017-01-05 LAB — BASIC METABOLIC PANEL
ANION GAP: 8 (ref 5–15)
BUN: <5 mg/dL — ABNORMAL LOW (ref 6–20)
CALCIUM: 8.5 mg/dL — AB (ref 8.9–10.3)
CO2: 19 mmol/L — ABNORMAL LOW (ref 22–32)
Chloride: 112 mmol/L — ABNORMAL HIGH (ref 101–111)
Creatinine, Ser: 0.74 mg/dL (ref 0.44–1.00)
GFR calc Af Amer: >60 mL/min (ref >60)
GLUCOSE: 86 mg/dL (ref 65–99)
POTASSIUM: 3.1 mmol/L — AB (ref 3.5–5.1)
Sodium: 139 mmol/L (ref 135–145)

## 2017-01-05 LAB — C DIFFICILE QUICK SCREEN W PCR REFLEX
C DIFFICILE (CDIFF) INTERP: NOT DETECTED
C DIFFICLE (CDIFF) ANTIGEN: NEGATIVE
C Diff toxin: NEGATIVE

## 2017-01-05 LAB — MAGNESIUM: MAGNESIUM: 1.9 mg/dL (ref 1.7–2.4)

## 2017-01-05 MED ORDER — HYDROMORPHONE HCL 1 MG/ML IJ SOLN
0.5000 mg | INTRAMUSCULAR | Status: DC | PRN
  Administered 2017-01-05 – 2017-01-06 (×6): 1 mg via INTRAVENOUS
  Filled 2017-01-05 (×6): qty 1

## 2017-01-05 MED ORDER — ACETAMINOPHEN 500 MG PO TABS
1000.0000 mg | ORAL_TABLET | Freq: Four times a day (QID) | ORAL | Status: DC
  Administered 2017-01-05 – 2017-01-06 (×4): 1000 mg via ORAL
  Filled 2017-01-05 (×5): qty 2

## 2017-01-05 MED ORDER — DIAZEPAM 5 MG PO TABS: 5.0000 mg | ORAL_TABLET | Freq: Three times a day (TID) | ORAL | Status: DC | PRN

## 2017-01-05 MED ORDER — POTASSIUM CHLORIDE CRYS ER 20 MEQ PO TBCR
40.0000 meq | EXTENDED_RELEASE_TABLET | Freq: Four times a day (QID) | ORAL | Status: AC
  Administered 2017-01-05: 40 meq via ORAL
  Filled 2017-01-05: qty 2

## 2017-01-05 MED ORDER — OXYCODONE HCL 5 MG PO TABS
5.0000 mg | ORAL_TABLET | Freq: Four times a day (QID) | ORAL | Status: DC
  Administered 2017-01-05 – 2017-01-06 (×2): 10 mg via ORAL
  Administered 2017-01-06: 5 mg via ORAL
  Administered 2017-01-06: 10 mg via ORAL
  Filled 2017-01-05 (×3): qty 2
  Filled 2017-01-05: qty 1

## 2017-01-05 NOTE — Progress Notes (Signed)
PROGRESS NOTE                                                                                                                                                                                                             Patient Demographics:    Jamie Allen, is a 46 y.o. female, DOB - 27-Feb-1971, NTI:[id number]  Admit date - 01/02/2017   Admitting Physician Hosie Poisson, MD  Outpatient Primary MD for the patient is Minerva Ends, MD  LOS - 2    Chief Complaint  Patient presents with  . Abdominal Pain  . Leg Pain  . Post-op Problem       Brief Narrative   46 y.o. female with medical history significant of hypothyroidism, anxiety , depression, chronic cholecystitis s/p lap cholecystectomy on 3/8. GERD, obesity, ? Cirrhosis, h/o alcohol dependence, anemia of chronic disease, hypertension, peripheral neuropathy presents to ED with complaints of abdominal pain, as well as significant ecchymosis in lower abdomen/right hip area.   Subjective:    Jamie Allen today has, No headache, No chest pain,reports abdominal pain, Less anxious today, had some moderate retention yesterday, as well she reports some diarrhea    Active Problems:   Cirrhosis (HCC)   Anxiety and depression   Hypokalemia   Alcohol dependence (HCC)   Neuropathic pain, leg, bilateral   Hypothyroidism   Chronic cholecystitis with calculus s/p lap cholecystectomy 12/25/2016   Hepatosplenomegaly   GERD (gastroesophageal reflux disease)   Obesity (BMI 30-39.9)   Abdominal wall cellulitis   Hypomagnesemia  Abdominal wall ecchymosis - It does not look like cellulitis, she is afebrile, has no leukocytosis, especially with her history of C. Difficile, discontinued IV antibiotics, Continue on probiotics. - SCDs for DVT prophylaxis - hemoglobin has been stable, will monitor closely, unclear the provoking factor of her ecchymosis, she denies any fall or trauma. - Right lower extremity  ultrasound is negative for DVT  Hypothyroidism - Continue Synthroid  Chronic cholecystitis s/p lap cholecystectomy - Surgery on board.   GERD - Continue with PPI  Hypokalemia - repleted , recheck in a.m.  Neuropathy - continue medication  Anxiety and depression - Continue with home medication  Diarrhea  - Check C. difficile   Continue to monitor urine output closely and check bladder scan  .  Code Status : Full  Family Communication  :  Mother at bedside  Disposition Plan  : Home when stable  Consults  :  Gen surgery  Procedures  : None  DVT Prophylaxis  :  SCDs , no DVT prophylaxis in the setting of significant ecchymosis  Lab Results  Component Value Date   PLT 133 (L) 01/05/2017    Antibiotics  :    Anti-infectives    Start     Dose/Rate Route Frequency Ordered Stop   01/03/17 1200  piperacillin-tazobactam (ZOSYN) IVPB 3.375 g  Status:  Discontinued    Comments:  Pharmacy may adjust dosing strength, schedule, rate of infusion, etc as needed to optimize therapy   3.375 g 12.5 mL/hr over 240 Minutes Intravenous Every 8 hours 01/03/17 0746 01/04/17 1225   01/03/17 0800  vancomycin (VANCOCIN) IVPB 750 mg/150 ml premix  Status:  Discontinued     750 mg 150 mL/hr over 60 Minutes Intravenous Every 12 hours 01/02/17 2050 01/03/17 0641   01/03/17 0600  piperacillin-tazobactam (ZOSYN) IVPB 3.375 g  Status:  Discontinued     3.375 g 12.5 mL/hr over 240 Minutes Intravenous Every 8 hours 01/02/17 2123 01/03/17 0641   01/03/17 0330  cefTRIAXone (ROCEPHIN) 2 g in dextrose 5 % 50 mL IVPB  Status:  Discontinued     2 g 100 mL/hr over 30 Minutes Intravenous Daily at bedtime 01/03/17 0317 01/03/17 0746   01/02/17 2200  piperacillin-tazobactam (ZOSYN) IVPB 3.375 g  Status:  Discontinued     3.375 g 100 mL/hr over 30 Minutes Intravenous  Once 01/02/17 2123 01/03/17 0641   01/02/17 2100  vancomycin (VANCOCIN) IVPB 1000 mg/200 mL premix     1,000 mg 200 mL/hr over 60  Minutes Intravenous  Once 01/02/17 2050 01/02/17 2330   01/02/17 2015  piperacillin-tazobactam (ZOSYN) IVPB 3.375 g     3.375 g 12.5 mL/hr over 240 Minutes Intravenous  Once 01/02/17 2009 01/02/17 2113        Objective:   Vitals:   01/04/17 0616 01/04/17 1359 01/04/17 2115 01/05/17 0510  BP: 101/60 (!) 113/59 (!) 108/51 120/79  Pulse: 86 82 91 97  Resp: 18 18 18 18   Temp: 97.4 F (36.3 C) 97.7 F (36.5 C) 98 F (36.7 C) 97.6 F (36.4 C)  TempSrc: Oral Oral Axillary Oral  SpO2: 99% 99% 97% 97%  Weight:      Height:        Wt Readings from Last 3 Encounters:  01/04/17 79.5 kg (175 lb 5 oz)  12/24/16 66.7 kg (147 lb 1.6 oz)  11/24/16 74.5 kg (164 lb 3.2 oz)     Intake/Output Summary (Last 24 hours) at 01/05/17 1356 Last data filed at 01/05/17 1200  Gross per 24 hour  Intake              600 ml  Output              400 ml  Net              200 ml     Physical Exam  Awake Alert, Oriented X 3, Extremely anxious Supple Neck,No JVD,  Symmetrical Chest wall movement, Good air movement bilaterally, CTAB RRR,No Gallops,Rubs or new Murmurs,e +ve B. mildly distended, tender to palpation at the incision areas, but incision site with no drainage infection or , patient with ecchymosis and right lower flank, lateral hip, lower abdomen area , Ecchymosis appears to be subsiding ,not warm or swollen to indicate cellulitis ., No rebound - guarding or rigidity. No  Cyanosis, Clubbing , right lower extremity +1 edema    Data Review:    CBC  Recent Labs Lab 01/02/17 2022 01/03/17 0530 01/04/17 0434 01/05/17 0448  WBC 10.8* 8.8 10.9* 7.3  HGB 8.8* 8.4* 8.3* 8.1*  HCT 24.7* 25.1* 23.7* 23.7*  PLT 117* 104* 130* 133*  MCV 95.7 95.1 97.9 99.2  MCH 34.1* 31.8 34.3* 33.9  MCHC 35.6 33.5 35.0 34.2  RDW 21.1* 21.4* 22.0* 22.3*  LYMPHSABS 1.7  --   --   --   MONOABS 0.8  --   --   --   EOSABS 0.1  --   --   --   BASOSABS 0.0  --   --   --     Chemistries   Recent Labs Lab  01/02/17 2022 01/03/17 0530 01/04/17 0434 01/05/17 0443 01/05/17 0448  NA 136 136 139  --  139  K 2.9* 4.2 3.1*  --  3.1*  CL 103 106 108  --  112*  CO2 20* 18* 21*  --  19*  GLUCOSE 95 157* 107*  --  86  BUN 7 5* 8  --  <5*  CREATININE 0.79 0.74 0.71  --  0.74  CALCIUM 9.2 8.0* 8.2*  --  8.5*  MG 1.6* 2.7*  --  1.9  --   AST 50* 48* 51*  --   --   ALT 16 18 18   --   --   ALKPHOS 165* 152* 144*  --   --   BILITOT 2.0* 1.9* 1.2  --   --    ------------------------------------------------------------------------------------------------------------------ No results for input(s): CHOL, HDL, LDLCALC, TRIG, CHOLHDL, LDLDIRECT in the last 72 hours.  Lab Results  Component Value Date   HGBA1C 5.3 05/19/2016   ------------------------------------------------------------------------------------------------------------------  Recent Labs  01/04/17 0434  TSH 1.004   ------------------------------------------------------------------------------------------------------------------ No results for input(s): VITAMINB12, FOLATE, FERRITIN, TIBC, IRON, RETICCTPCT in the last 72 hours.  Coagulation profile  Recent Labs Lab 01/02/17 2022  INR 1.51    No results for input(s): DDIMER in the last 72 hours.  Cardiac Enzymes No results for input(s): CKMB, TROPONINI, MYOGLOBIN in the last 168 hours.  Invalid input(s): CK ------------------------------------------------------------------------------------------------------------------    Component Value Date/Time   BNP 84.0 01/02/2017 2022   BNP 50.2 07/23/2016 1439    Inpatient Medications  Scheduled Meds: . acetaminophen  1,000 mg Oral Q6H  . estradiol  1 mg Oral Daily  . gabapentin  300 mg Oral QHS  . levothyroxine  75 mcg Oral QAC breakfast  . lip balm  1 application Topical BID  . multivitamins with iron  1 tablet Oral Daily  . oxyCODONE  5-10 mg Oral Q6H  . potassium chloride  40 mEq Oral Q6H  . pregabalin  50 mg Oral  Daily  . psyllium  1 packet Oral Daily  . saccharomyces boulardii  250 mg Oral BID  . sodium chloride flush  3 mL Intravenous Q12H   Continuous Infusions: PRN Meds:.sodium chloride, alum & mag hydroxide-simeth, bisacodyl, diazepam, diphenhydrAMINE, hydrALAZINE, HYDROmorphone (DILAUDID) injection, lactated ringers, magic mouthwash, menthol-cetylpyridinium, methocarbamol (ROBAXIN)  IV, methocarbamol, metoprolol, ondansetron (ZOFRAN) IV **OR** ondansetron (ZOFRAN) IV, ondansetron, oxyCODONE, phenol, polyethylene glycol, prochlorperazine, sodium chloride flush  Micro Results Recent Results (from the past 240 hour(s))  Blood culture (routine x 2)     Status: None (Preliminary result)   Collection Time: 01/02/17  8:25 PM  Result Value Ref Range Status   Specimen Description BLOOD RIGHT HAND  Final  Special Requests IN PEDIATRIC BOTTLE 2ML  Final   Culture   Final    NO GROWTH 1 DAY Performed at [facility], [place] [address], [place], [place] [address]    Report Status PENDING  Incomplete  Blood culture (routine x 2)     Status: None (Preliminary result)   Collection Time: 01/02/17  8:30 PM  Result Value Ref Range Status   Specimen Description BLOOD LEFT HAND  Final   Special Requests BOTTLES DRAWN AEROBIC AND ANAEROBIC 5ML  Final   Culture   Final    NO GROWTH 1 DAY Performed at [facility], [place] [address] [place],  [address]    Report Status PENDING  Incomplete    Radiology Reports Ct Abdomen Pelvis Wo Contrast  Result Date: 12/08/2016 CLINICAL DATA:  46 y/o F; 2 weeks of abdominal pain mostly on the right. EXAM: CT ABDOMEN AND PELVIS WITHOUT CONTRAST TECHNIQUE: Multidetector CT imaging of the abdomen and pelvis was performed following the standard protocol without IV contrast. COMPARISON:  None. FINDINGS: Lower chest: No acute abnormality. Hepatobiliary: Hepatomegaly with the right hepatic lobe measuring up to 21 cm cranial caudal. Hepatic steatosis. Normal  gallbladder. No intra or extrahepatic biliary ductal dilatation. Pancreas: Unremarkable. No pancreatic ductal dilatation or surrounding inflammatory changes. Spleen: Splenomegaly, spleen volume proximal 550 cc. Adrenals/Urinary Tract: Left kidney atrophy. No mass, urinary stone disease, or obstructive uropathy identified. Normal bladder. Stomach/Bowel: Stomach is within normal limits. Appendix appears normal. No evidence of bowel wall thickening, distention, or inflammatory changes. Vascular/Lymphatic: No significant vascular findings are present. No enlarged abdominal or pelvic lymph nodes. Reproductive: Uterus and bilateral adnexa are unremarkable. Other: There is fat stranding within the mesenteric in the right lower quadrant without a discrete fluid collection (series 2, image 48). Centered within the area of edema and is a focus of fat attenuation with peripheral soft tissue density (series 5, image 87). No ascites. Musculoskeletal: No acute or significant osseous findings. IMPRESSION: 1. Nonspecific fat stranding within the mesentery of the right lower quadrant with findings suggestive of a fat infarct. Differential includes an underlying infectious/inflammatory process or edema related to liver disease / third spacing. Adjacent loops of small bowel and the appendix appear normal. No discrete fluid collection or mass identified. 2. Hepatosplenomegaly.  Hepatic steatosis. Electronically Signed   By: Kristine Garbe M.D.   On: 12/08/2016 19:18   Dg Cholangiogram Operative  Result Date: 12/25/2016 CLINICAL DATA:  46 year old female with a history of cholelithiasis EXAM: INTRAOPERATIVE CHOLANGIOGRAM TECHNIQUE: Cholangiographic images from the C-arm fluoroscopic device were submitted for interpretation post-operatively. Please see the procedural report for the amount of contrast and the fluoroscopy time utilized. COMPARISON:  MR 12/24/2016 FINDINGS: Surgical instruments project over the upper abdomen.  There is cannulation of the cystic duct/gallbladder neck, with antegrade infusion of contrast. Caliber of the extrahepatic ductal system within normal limits. No large filling defect identified. Free flow of contrast across the ampulla. IMPRESSION: Intraoperative cholangiogram demonstrates extrahepatic biliary ducts of unremarkable caliber, with no large filling defect identified. Free flow of contrast across the ampulla. Please refer to the dictated operative report for full details of intraoperative findings and procedure Electronically Signed   By: Corrie Mckusick D.O.   On: 12/25/2016 15:37   Ct Abdomen Pelvis W Contrast  Result Date: 01/03/2017 CLINICAL DATA:  Cholecystectomy 1 week ago. Abdominal distention and leg pain for 4 days. Generalized abdominal pain. Weight loss over 1 month. Unable to urinate. Low grade fevers  tonight. Patient received 4 hour premedication prior to scanning. No contrast reaction reported. EXAM: CT ABDOMEN AND PELVIS WITH CONTRAST TECHNIQUE: Multidetector CT imaging of the abdomen and pelvis was performed using the standard protocol following bolus administration of intravenous contrast. CONTRAST:  181m ISOVUE-300 IOPAMIDOL (ISOVUE-300) INJECTION 61% COMPARISON:  MRI abdomen 12/24/2016. CT abdomen and pelvis 12/14/2016 FINDINGS: Lower chest: Atelectasis in the lung bases. Hepatobiliary: Postoperative cholecystectomy. There is gas and fluid in the gallbladder fossa. This appearance is consistent with Surgicel if this was used at the time of surgery. Correlation with surgical history is recommended. If no Surgicel was used, this could indicate an abscess. There is mild infiltration in the fat of the right upper quadrant, possibly postoperative or may be indicating inflammatory infiltration. No discrete loculated fluid collection is otherwise indicated. No significant free fluid to suggest a bowel leak. No focal liver lesions. Pancreas: Unremarkable. No pancreatic ductal dilatation or  surrounding inflammatory changes. Spleen: Normal in size without focal abnormality. Adrenals/Urinary Tract: No adrenal gland nodules. The left kidney is atrophic. Nephrograms are symmetrical. No hydronephrosis or hydroureter. Bladder wall is not thickened. Stomach/Bowel: Stomach and small bowel are not abnormally distended and no wall thickening is appreciated. Colon is diffusely fluid-filled without distention. There is suggestion of wall thickening in the cecum and hepatic flexure. This could represent colitis or may represent reactive inflammatory change. Appendix is normal. Vascular/Lymphatic: No significant vascular findings are present. No enlarged abdominal or pelvic lymph nodes. Reproductive: Uterus and bilateral adnexa are unremarkable. Other: Small amount of free fluid in the pelvis. Infiltration in the subcutaneous fat of the abdominal wall may represent edema or cellulitis. Small periumbilical hernia containing fat. No significant free air in the abdomen. Musculoskeletal: No acute or significant osseous findings. IMPRESSION: Fluid and gas in the gallbladder fossa post cholecystectomy. This could be due to Surgicel (if used at surgery) or abscess. Correlation with surgical history needed. Infiltration in the RUQ fat possibly inflammatory. Mild wall thickening of hepatic flexure and right colon may indicate colitis or may be reactive. No significant free fluid or free air. Minimal pelvic free fluid. Edema in the subcutaneous fat. Called report to Dr. IEllender Hoseat [phone] on 01/03/17. Electronically Signed   By: WLucienne CapersM.D.   On: 01/03/2017 06:28   Mr 3d Recon At Scanner  Result Date: 12/25/2016 CLINICAL DATA:  Chronic nausea, vomiting common right abdominal pain with chronically abnormal LFTs. EXAM: MRI ABDOMEN WITHOUT AND WITH CONTRAST (INCLUDING MRCP) TECHNIQUE: Multiplanar multisequence MR imaging of the abdomen was performed both before and after the administration of intravenous contrast.  Heavily T2-weighted images of the biliary and pancreatic ducts were obtained, and three-dimensional MRCP images were rendered by post processing. CONTRAST:  179mMULTIHANCE GADOBENATE DIMEGLUMINE 529 MG/ML IV SOLN COMPARISON:  CT scan 12/14/2016 FINDINGS: Lower chest:  Unremarkable. Hepatobiliary: Loss of signal within the hepatic parenchyma on out of phase T1 weighted imaging is compatible with fatty deposition. The liver remains enlarged at 21.5 cm craniocaudal length.Probable small volume sludge dependently in the gallbladder lumen without discrete stones evident. No gallbladder wall thickening or pericholecystic fluid. No intra or extrahepatic biliary duct dilatation. Extrahepatic common duct measures 6 mm diameter. Common bile duct in the head of the pancreas is 5 mm diameter. Pancreas: No focal mass lesion. No dilatation of the main duct. No intraparenchymal cyst. No peripancreatic edema. Spleen: Spleen is 15.5 cm cranial caudal length. No focal mass lesion. Adrenals/Urinary Tract: No adrenal nodule or mass. Left kidney is atrophic  with areas of focal cortical scarring. Right kidney unremarkable. Stomach/Bowel: Stomach is nondistended. No gastric wall thickening. No evidence of outlet obstruction. Duodenum is normally positioned as is the ligament of Treitz. No small bowel or colonic dilatation within the visualized abdomen. Vascular/Lymphatic: No abdominal aortic aneurysm. No abdominal lymphadenopathy. Other: No intraperitoneal free fluid. Musculoskeletal: No abnormal marrow enhancement within the visualized bony anatomy. IMPRESSION: 1. Hepatic steatosis with hepatomegaly. 2. No evidence of gallstones. No gallbladder wall thickening or pericholecystic fluid. No biliary ductal dilatation. No choledocholithiasis. 3. Spleen upper limits of normal for size to borderline enlarged. Electronically Signed   By: Misty Stanley M.D.   On: 12/25/2016 07:16   Dg Abd Acute W/chest  Result Date: 12/20/2016 CLINICAL DATA:   Pt c/o lower and right sided abdominal pain since Feb. 12th, as well as vomiting, diarrhea, and fever. H/o C-diff, gallbladder disease, and HTN. Smoker. EXAM: DG ABDOMEN ACUTE W/ 1V CHEST COMPARISON:  Acute abdomen series dated 06/24/2015. FINDINGS: Single view of the chest: New streaky opacities at each lung base, most likely mild atelectasis. Lungs otherwise clear. Lung volumes are normal. No pleural effusion or pneumothorax seen. Heart size and mediastinal contours are normal. Osseous structures about the chest are unremarkable. Upright and supine views of the abdomen: Bowel gas pattern is nonobstructive. No evidence of soft tissue mass or abnormal fluid collection. No evidence of free intraperitoneal air. Osseous structures of the abdomen and pelvis are unremarkable. Small calcifications within the lower pelvis are likely benign vascular phleboliths. IMPRESSION: 1. Probable mild bibasilar atelectasis. Lungs otherwise clear. No evidence of pneumonia or pulmonary edema. 2. Nonobstructive bowel gas pattern and no evidence of acute intra-abdominal abnormality. Electronically Signed   By: Franki Cabot M.D.   On: 12/20/2016 15:16   Mr Abdomen Mrcp Moise Boring Contast  Result Date: 12/25/2016 CLINICAL DATA:  Chronic nausea, vomiting common right abdominal pain with chronically abnormal LFTs. EXAM: MRI ABDOMEN WITHOUT AND WITH CONTRAST (INCLUDING MRCP) TECHNIQUE: Multiplanar multisequence MR imaging of the abdomen was performed both before and after the administration of intravenous contrast. Heavily T2-weighted images of the biliary and pancreatic ducts were obtained, and three-dimensional MRCP images were rendered by post processing. CONTRAST:  39m MULTIHANCE GADOBENATE DIMEGLUMINE 529 MG/ML IV SOLN COMPARISON:  CT scan 12/14/2016 FINDINGS: Lower chest:  Unremarkable. Hepatobiliary: Loss of signal within the hepatic parenchyma on out of phase T1 weighted imaging is compatible with fatty deposition. The liver remains  enlarged at 21.5 cm craniocaudal length.Probable small volume sludge dependently in the gallbladder lumen without discrete stones evident. No gallbladder wall thickening or pericholecystic fluid. No intra or extrahepatic biliary duct dilatation. Extrahepatic common duct measures 6 mm diameter. Common bile duct in the head of the pancreas is 5 mm diameter. Pancreas: No focal mass lesion. No dilatation of the main duct. No intraparenchymal cyst. No peripancreatic edema. Spleen: Spleen is 15.5 cm cranial caudal length. No focal mass lesion. Adrenals/Urinary Tract: No adrenal nodule or mass. Left kidney is atrophic with areas of focal cortical scarring. Right kidney unremarkable. Stomach/Bowel: Stomach is nondistended. No gastric wall thickening. No evidence of outlet obstruction. Duodenum is normally positioned as is the ligament of Treitz. No small bowel or colonic dilatation within the visualized abdomen. Vascular/Lymphatic: No abdominal aortic aneurysm. No abdominal lymphadenopathy. Other: No intraperitoneal free fluid. Musculoskeletal: No abnormal marrow enhancement within the visualized bony anatomy. IMPRESSION: 1. Hepatic steatosis with hepatomegaly. 2. No evidence of gallstones. No gallbladder wall thickening or pericholecystic fluid. No biliary ductal dilatation. No choledocholithiasis.  3. Spleen upper limits of normal for size to borderline enlarged. Electronically Signed   By: Misty Stanley M.D.   On: 12/25/2016 07:16   Ct Angio Abd/pel W And/or Wo Contrast  Result Date: 12/14/2016 CLINICAL DATA:  Chronic escalating abdominal pain for 2 weeks with nausea and vomiting. EXAM: CT ANGIOGRAPHY ABDOMEN AND PELVIS TECHNIQUE: Multidetector CT imaging of the abdomen and pelvis was performed using the standard protocol during bolus administration of intravenous contrast. Multiplanar reconstructed images including MIPs were obtained and reviewed to evaluate the vascular anatomy. CONTRAST:  80 cc Isovue 370  COMPARISON:  12/08/2016 CT without contrast FINDINGS: Arterial findings: Aorta: Intact abdominal aorta. No significant atherosclerotic change. No occlusion, dissection, aneurysm, or retroperitoneal hemorrhage. Celiac axis: Widely patent origin. Splenic, left gastric, hepatic branches are patent. Superior mesenteric:  Widely patent origin. Left renal: Minor origin atherosclerosis. Minimal ostial narrowing without stenosis. Accessory left renal artery to the lower pole also patent. Right renal:          Widely patent. Inferior mesenteric:  Widely patent. Left iliac: Left common, internal and external iliac arteries are patent. No inflow disease. Right iliac: Right common, internal and external iliac arteries are patent. No inflow disease. Venous findings: Hepatic, portal, splenic, and mesenteric veins are patent. No veno-occlusive process. IVC, and renal veins appear patent. Limited opacification of the iliac and femoral veins. Review of the MIP images confirms the above findings. Nonvascular findings: Bibasilar atelectasis. No significant pleural effusion. No pericardial effusion. Normal heart size. Hepatomegaly evident. Liver measures 23 cm in length. No focal hepatic abnormality or biliary dilatation. Trace amount of pericholecystic fluid, nonspecific. Difficult to exclude mild cholecystitis. Consider further evaluation with ultrasound. Spleen is enlarged measuring 16 cm in length. No focal splenic abnormality. Pancreas demonstrates normal and without acute process. Normal appearing adrenal glands. Kidneys demonstrate no acute obstruction. Left kidney demonstrates upper pole cortical atrophy appearing chronic. No renal obstruction or hydronephrosis. No obstructing ureteral calculus. No bladder abnormality. Negative bowel obstruction, significant dilatation, ileus, or free air. Appendix is unremarkable without distention or inflammation. No fluid collection or abscess. Trace pelvic free fluid, suspect physiologic.  Uterus and adnexal normal in size. No pelvic fluid collection or hemorrhage. No inguinal abnormality or adenopathy.  Intact abdominal wall. No acute osseous finding. IMPRESSION: Patent mesenteric and renal vasculature. Minor abdominal aortic atherosclerosis without acute vascular process. No evidence of mesenteric or renal vascular occlusive process. Hepatosplenomegaly. Bibasilar atelectasis Trace pericholecystic fluid and slight gallbladder distention, nonspecific but difficult to exclude cholecystitis. Recommend correlation with exam and consider right upper quadrant ultrasound. Electronically Signed   By: Jerilynn Mages.  Shick M.D.   On: 12/14/2016 15:15   US Abdomen Limited Ruq  Result Date: 12/20/2016 CLINICAL DATA:  Abdominal pain since December 01, 2016 EXAM: US ABDOMEN LIMITED - RIGHT UPPER QUADRANT COMPARISON:  12/14/2016 FINDINGS: Gallbladder: Layering sludge. No shadowing gallstone noted. No sonographic Murphy sign or inflammatory wall thickening. Common bile duct: Diameter: 5-6 mm.  Where visualized, no filling defect. Liver: Echogenic compatible with steatosis. No evidence of mass lesion. Antegrade flow in the main portal vein. IMPRESSION: 1. Sludge without shadowing stone.  No signs of cholecystitis. 2. Hepatic steatosis. Electronically Signed   By: Monte Fantasia M.D.   On: 12/20/2016 17:19   US Abdomen Limited Ruq  Result Date: 12/14/2016 CLINICAL DATA:  Right upper quadrant pain for 3 years. EXAM: US ABDOMEN LIMITED - RIGHT UPPER QUADRANT COMPARISON:  CT, 12/14/2016 at 1437 hours FINDINGS: Gallbladder: Gallbladder is distended. There is dependent  sludge, but no shadowing stones. Wall is not thickened, but there is mild pericholecystic fluid between the gallbladder and liver. No sonographic Murphy's sign. Common bile duct: Diameter: 4.5 mm Liver: Mildly enlarged with heterogeneous increased echogenicity and an overall coarsened echotexture. No discrete liver mass or focal lesion. IMPRESSION: 1. No  evidence of acute cholecystitis.  No bile duct dilation. 2. Gallbladder sludge but no shadowing stones. 3. Pericholecystic edema seen between the gallbladder and liver. This is likely on the basis of liver pathology. 4. Hepatomegaly. Sonographic appearance of the liver suggests heterogeneous fatty infiltration, cirrhosis or a combination. Electronically Signed   By: Lajean Manes M.D.   On: 12/14/2016 17:03     Shandiin Eisenbeis M.D on 01/05/2017 at 1:56 PM  Between 7am to 7pm - Pager - [phone]  After 7pm go to [email or web address] - password [facility]  [facility] -  Office  [phone]

## 2017-01-05 NOTE — Progress Notes (Signed)
Occupational Therapy Treatment Patient Details Name: Kimmora Risenhoover MRN: [id number] DOB: [birth year] Today's Date: 01/05/2017    History of present illness Pt admitted with abdominal pain and cellulites, s/p lap chole on 12/25/16. PMH: B LE fxs, obesity, anemia, peripheral neuropathy, cirrhosis, ETOH abuse, anxiety and depression, PTSD.   OT comments  Pt agreed to get to chair  Follow Up Recommendations  No OT follow up    Equipment Recommendations  None recommended by OT    Recommendations for Other Services      Precautions / Restrictions Precautions Precautions: Fall       Mobility Bed Mobility Overal bed mobility: Needs Assistance Bed Mobility: Supine to Sit     Supine to sit: Min assist        Transfers Overall transfer level: Needs assistance   Transfers: Sit to/from Stand;Stand Pivot Transfers Sit to Stand: Min assist Stand pivot transfers: Min assist                ADL Overall ADL's : Needs assistance/impaired                                       General ADL Comments: pt crying in bed and agreed to get up to chair.  Pt overall  mod A with VC for technique.  Pt still crying in chair but not as much.  RN aware as well as CNA.  UPon distracting pt with conversation crying stopped.  Encouraged pt to read or call someone and talk to distract her from her current situation. Pt agreed and acknowledged when we were talking she wasnt thiniking about her pain.                Cognition   Behavior During Therapy: WFL for tasks assessed/performed Overall Cognitive Status: Within Functional Limits for tasks assessed                                    Pertinent Vitals/ Pain       Pain Score: 8  Pain Location: abdomen Pain Descriptors / Indicators: Crying Pain Intervention(s): Monitored during session;Repositioned;Relaxation;Limited activity within patient's tolerance;Patient requesting pain meds-RN notified          Frequency  Min 2X/week        Progress Toward Goals  OT Goals(current goals can now be found in the care plan section)  Progress towards OT goals: Progressing toward goals     Plan Discharge plan remains appropriate       End of Session    OT Visit Diagnosis: Unsteadiness on feet (R26.81);Pain Pain - part of body:  (abdomen)   Activity Tolerance Patient tolerated treatment well   Patient Left in bed;with call bell/phone within reach;with family/visitor present   Nurse Communication  (reports pt has walked half of the unit with IV pole today)        Time: 5465-6812 OT Time Calculation (min): 22 min  Charges: OT General Charges $OT Visit: 1 Procedure OT Treatments $Self Care/Home Management : 8-22 mins  [place], [place]   Betsy Pries 01/05/2017, 1:21 PM

## 2017-01-05 NOTE — Progress Notes (Signed)
Subjective: She is crying with pain and it seems to be constant.  Says her stomach is getting bigger.  She did ok with a bagel but had issues with potatoes?  She is concerned with the ecchymosis lower abdomen and right thigh.  .  Overall feels miserable. Mother is in there with her and says she is not like this at home.    Objective: Vital signs in last 24 hours: Temp:  [97.6 F (36.4 C)-98 F (36.7 C)] 97.6 F (36.4 C) (03/19 0510) Pulse Rate:  [82-97] 97 (03/19 0510) Resp:  [18] 18 (03/19 0510) BP: (108-120)/(51-79) 120/79 (03/19 0510) SpO2:  [97 %-99 %] 97 % (03/19 0510) Last BM Date: 01/05/17 600 Po 400 urine recorded BM x 1 Afebrile, VSS K+ 3.1  wBC 7.3 HH stable Platelets 133K CT scan 01/03/17:Postoperative cholecystectomy. There is gas and fluid in the gallbladder fossa. This appearance is consistent with Surgicel if this was used at the time of surgery. Correlation with surgical history is recommended. If no Surgicel was used, this could indicate an abscess. There is mild infiltration in the fat of the right upper quadrant, possibly postoperative or may be indicating inflammatory infiltration. No discrete loculated fluid collection is otherwise indicated. No significant free fluid to suggest a bowel leak. No focal liver lesions.  Intake/Output from previous day: 03/18 0701 - 03/19 0700 In: 600 [P.O.:600] Out: 400 [Urine:400] Intake/Output this shift: Total I/O In: 240 [P.O.:240] Out: -   General appearance: alert, cooperative and Crying complaint of abdominal pain, primarily lower abdominal area. Resp: clear to auscultation bilaterally GI: She is distended. She has bowel sounds. She's having diarrhea. Port sites all look good. She has ecchymosis lower abdomen going down the right thigh laterally.  Lab Results:   Recent Labs  01/04/17 0434 01/05/17 0448  WBC 10.9* 7.3  HGB 8.3* 8.1*  HCT 23.7* 23.7*  PLT 130* 133*    BMET  Recent Labs   01/04/17 0434 01/05/17 0448  NA 139 139  K 3.1* 3.1*  CL 108 112*  CO2 21* 19*  GLUCOSE 107* 86  BUN 8 <5*  CREATININE 0.71 0.74  CALCIUM 8.2* 8.5*   PT/INR  Recent Labs  01/02/17 2022  LABPROT 18.4*  INR 1.51     Recent Labs Lab 01/02/17 2022 01/03/17 0530 01/04/17 0434  AST 50* 48* 51*  ALT 16 18 18   ALKPHOS 165* 152* 144*  BILITOT 2.0* 1.9* 1.2  PROT 6.9 6.6 6.9  ALBUMIN 3.1* 2.9* 3.1*     Lipase     Component Value Date/Time   LIPASE 28 01/03/2017 0530     Studies/Results: No results found. Prior to Admission medications   Medication Sig Start Date End Date Taking? Authorizing Provider  diazepam (VALIUM) 5 MG tablet Take 1 tablet (5 mg total) by mouth every 12 (twelve) hours as needed for anxiety. Patient taking differently: Take 5 mg by mouth at bedtime.  09/18/16  Yes Josalyn Funches, MD  docusate sodium (COLACE) 100 MG capsule Take 1 capsule (100 mg total) by mouth 2 (two) times daily. You can get a stool softener over the counter. Recommend taking while you are taking narcotics. 12/30/16  Yes Jerrye Beavers, PA-C  estradiol (ESTRACE) 1 MG tablet Take 1 tablet (1 mg total) by mouth daily. 11/25/16 11/25/17 Yes Woodroe Mode, MD  gabapentin (NEURONTIN) 300 MG capsule Take 3 capsules (900 mg total) by mouth 3 (three) times daily. Patient taking differently: Take 300 mg by mouth at  bedtime.  05/19/16  Yes Josalyn Funches, MD  levothyroxine (SYNTHROID, LEVOTHROID) 75 MCG tablet Take 1 tablet (75 mcg total) by mouth daily before breakfast. 10/01/16  Yes Josalyn Funches, MD  medroxyPROGESTERone (PROVERA) 2.5 MG tablet Take 1 tablet (2.5 mg total) by mouth daily. 11/25/16 11/25/17 Yes Woodroe Mode, MD  methocarbamol (ROBAXIN) 500 MG tablet Take 2 tablets (1,000 mg total) by mouth at bedtime as needed for muscle spasms. 12/30/16  Yes Jerrye Beavers, PA-C  naproxen (NAPROSYN) 500 MG tablet Take 1 tablet (500 mg total) by mouth 2 (two) times daily with a meal. Patient  taking differently: Take 500 mg by mouth 2 (two) times daily as needed for mild pain.  12/08/16  Yes Dorie Rank, MD  ondansetron (ZOFRAN-ODT) 4 MG disintegrating tablet Take 1 tablet (4 mg total) by mouth every 6 (six) hours as needed for nausea. 12/30/16  Yes Jerrye Beavers, PA-C  oxyCODONE (OXY IR/ROXICODONE) 5 MG immediate release tablet Take 1 tablet (5 mg total) by mouth every 6 (six) hours as needed for moderate pain or severe pain. 12/30/16  Yes Jerrye Beavers, PA-C  potassium chloride 20 MEQ TBCR Take 20 mEq by mouth daily. 12/14/16  Yes Mercy Riding, MD  pregabalin (LYRICA) 50 MG capsule Take 1 capsule (50 mg total) by mouth 3 (three) times daily. For PASS Patient taking differently: Take 50 mg by mouth daily. For PASS 05/28/16  Yes Boykin Nearing, MD  saccharomyces boulardii (FLORASTOR) 250 MG capsule Take 1 capsule (250 mg total) by mouth 2 (two) times daily. You can get a probiotic over the counter. 12/30/16  Yes Jerrye Beavers, PA-C  polyethylene glycol powder (GLYCOLAX/MIRALAX) powder Add 6 cups of Miralax powder into 32 ounces of Gatorade and drink once. Then add 3 cups in 16 ounces of Gatorade and drink twice a day for 3 days. Patient not taking: Reported on 01/02/2017 12/14/16   Mercy Riding, MD    Medications: . estradiol  1 mg Oral Daily  . gabapentin  300 mg Oral QHS  . levothyroxine  75 mcg Oral QAC breakfast  . lip balm  1 application Topical BID  . multivitamins with iron  1 tablet Oral Daily  . potassium chloride  40 mEq Oral Q6H  . pregabalin  50 mg Oral Daily  . psyllium  1 packet Oral Daily  . saccharomyces boulardii  250 mg Oral BID  . sodium chloride flush  3 mL Intravenous Q12H    Assessment/Plan: Chronic cholecystitis with calculus s/p lap cholecystectomy, with bleeding from GB fossa 12/25/2016, Dr. Marlou Starks  Home 12/30/16, and readmit with abdominal pain possible cellulitis and leg edema No evidence of DVT on Vascular study Hypokalemia - 40 mg PO this AM Post op pain  control issues Possible Cirrhosis with chronically abnormal LFT's Hepatosplenomeagly Hypothyroid  Chronic Anemia Chronic neuropathy - on home gabapentin and lyrica  H/o c diff - probiotic Uterine fibroids Hypothyroid - synthroid Tobacco abuse Body mass index is 33.13 kg/m.   ID - ancef perioperative/ceftriaxone 01/02/17/zosyn  FEN - no IV fluids/soft diet VTE - SCDs - anemia  She's had Valium twice a day yesterday and none today. Neurontin 300 mg daily, Lyrica 50 mg daily.  Dilaudid: 10 mg yesterday, 3 mg today 1 dose of Robaxin yesterday. Oxycodone 10 mg yesterday and 10 mg today.   Plan: Going to get a plain film on her now. Give her Tylenol every 6 hours, resume her preadmission oxycodone. If she has an  ileus we will have to go back on the plans for PO meds.  Add toradol also. We'll give her some additional potassium this afternoon it is already scheduled. Recheck labs in a.m. continue to monitor her closely.    Plain film:  Visualized lung bases are clear. There are surgical clips in the right upper quadrant. Nonobstructed bowel-gas pattern. No abnormal calcifications.    LOS: 2 days    Storey Stangeland 01/05/2017 [phone]

## 2017-01-06 DIAGNOSIS — R109 Unspecified abdominal pain: Secondary | ICD-10-CM

## 2017-01-06 LAB — CBC
HEMATOCRIT: 23.5 % — AB (ref 36.0–46.0)
Hemoglobin: 7.7 g/dL — ABNORMAL LOW (ref 12.0–15.0)
MCH: 32.1 pg (ref 26.0–34.0)
MCHC: 32.8 g/dL (ref 30.0–36.0)
MCV: 97.9 fL (ref 78.0–100.0)
Platelets: 108 10*3/uL — ABNORMAL LOW (ref 150–400)
RBC: 2.4 MIL/uL — ABNORMAL LOW (ref 3.87–5.11)
RDW: 22.2 % — AB (ref 11.5–15.5)
WBC: 3.9 10*3/uL — AB (ref 4.0–10.5)

## 2017-01-06 LAB — BASIC METABOLIC PANEL
ANION GAP: 7 (ref 5–15)
BUN: <5 mg/dL — ABNORMAL LOW (ref 6–20)
CALCIUM: 8.6 mg/dL — AB (ref 8.9–10.3)
CO2: 22 mmol/L (ref 22–32)
Chloride: 109 mmol/L (ref 101–111)
Creatinine, Ser: 0.71 mg/dL (ref 0.44–1.00)
GFR calc Af Amer: >60 mL/min (ref >60)
GFR calc non Af Amer: >60 mL/min (ref >60)
GLUCOSE: 88 mg/dL (ref 65–99)
Potassium: 3.2 mmol/L — ABNORMAL LOW (ref 3.5–5.1)
Sodium: 138 mmol/L (ref 135–145)

## 2017-01-06 LAB — HEMOGLOBIN AND HEMATOCRIT, BLOOD
HCT: 25.2 % — ABNORMAL LOW (ref 36.0–46.0)
Hemoglobin: 8.4 g/dL — ABNORMAL LOW (ref 12.0–15.0)

## 2017-01-06 MED ORDER — OXYCODONE HCL 5 MG PO TABS: 5.0000 mg | ORAL_TABLET | Freq: Four times a day (QID) | ORAL | 0 refills | Status: DC | PRN

## 2017-01-06 MED ORDER — POTASSIUM CHLORIDE CRYS ER 20 MEQ PO TBCR
40.0000 meq | EXTENDED_RELEASE_TABLET | ORAL | Status: AC
  Administered 2017-01-06 (×2): 40 meq via ORAL
  Filled 2017-01-06 (×2): qty 2

## 2017-01-06 MED ORDER — DICYCLOMINE HCL 10 MG PO CAPS: 10.0000 mg | ORAL_CAPSULE | Freq: Three times a day (TID) | ORAL | 0 refills | Status: DC

## 2017-01-06 NOTE — Progress Notes (Signed)
Subjective: Pt sitting up and seems very happy now.  Says she vomited just before I came in. Her nurse says she ate a regular breakfast, then vomited into the trash can a yellow colored fluid with no solid content. She is also complaining of her pain being up to an 8 or 10 this morning, but currently is smiling and happy, with no complaints of pain after 1 mg of Dilaudid. She took 4 mg of Dilaudid yesterday, she's had 3 since 1 AM this morning. She took 3 doses a total of 30 mg of oxycodone yesterday and has had 2 doses total of 15 mg since 2 AM this morning. Her mother says her ambulation is limited due to her neuropathy. She does have some lower extremity edema.  Objective: Vital signs in last 24 hours: Temp:  [97.8 F (36.6 C)-98.6 F (37 C)] 97.8 F (36.6 C) (03/20 0849) Pulse Rate:  [85-95] 92 (03/20 0849) Resp:  [18] 18 (03/20 0630) BP: (114-124)/(59-67) 114/65 (03/20 0849) SpO2:  [94 %-99 %] 96 % (03/20 0849) Last BM Date: 01/06/17 720 PO Voided x 6 Reports she vomited x 1 this Am BM x 6 recorded yesterday Afebrile, VSS Labs stable, K+ down but not repeated today C. difficile is negative. MRCP 12/24/16:  . Hepatic steatosis with hepatomegaly. 2. No evidence of gallstones. No gallbladder wall thickening or pericholecystic fluid. No biliary ductal dilatation. No choledocholithiasis. 3. Spleen upper limits of normal for size to borderline enlarged. CT scan 01/03/17:  Fluid and gas in the gallbladder fossa post cholecystectomy. This could be due to Surgicel (if used at surgery) or abscess. Correlation with surgical history needed. Infiltration in the RUQ fat possibly inflammatory. Mild wall thickening of hepatic flexure and right colon may indicate colitis or may be reactive. No significant free fluid or free air. Minimal pelvic free fluid. Edema in the subcutaneous fat. Abdomen 1 view 01/03/17:  Visualized lung bases are clear. There are surgical clips in the right upper  quadrant. Nonobstructed bowel-gas pattern. No abnormal calcifications.   Intake/Output from previous day: 03/19 0701 - 03/20 0700 In: 720 [P.O.:720] Out: -  Intake/Output this shift: Total I/O In: 230 [P.O.:230] Out: -   General appearance: alert, cooperative and no distress Resp: clear to auscultation bilaterally GI: Soft, she's not complaining of any pain currently. She continues to have ecchymosis on the lower abdomen and down the right thigh. The abdomen does not appear any different today than yesterday.  Lower extremities:  She does have some edema lower legs  Lab Results:   Recent Labs  01/05/17 0448 01/06/17 0538 01/06/17 0811  WBC 7.3 3.9*  --   HGB 8.1* 7.7* 8.4*  HCT 23.7* 23.5* 25.2*  PLT 133* 108*  --     BMET  Recent Labs  01/05/17 0448 01/06/17 0538  NA 139 138  K 3.1* 3.2*  CL 112* 109  CO2 19* 22  GLUCOSE 86 88  BUN <5* <5*  CREATININE 0.74 0.71  CALCIUM 8.5* 8.6*   PT/INR No results for input(s): LABPROT, INR in the last 72 hours.   Recent Labs Lab 01/02/17 2022 01/03/17 0530 01/04/17 0434  AST 50* 48* 51*  ALT 16 18 18   ALKPHOS 165* 152* 144*  BILITOT 2.0* 1.9* 1.2  PROT 6.9 6.6 6.9  ALBUMIN 3.1* 2.9* 3.1*     Lipase     Component Value Date/Time   LIPASE 28 01/03/2017 0530     Studies/Results: Dg Abd 1 View  Result Date:  01/05/2017 CLINICAL DATA:  Lower abdominal pain for 4 days EXAM: ABDOMEN - 1 VIEW COMPARISON:  None. FINDINGS: Visualized lung bases are clear. There are surgical clips in the right upper quadrant. Nonobstructed bowel-gas pattern. No abnormal calcifications. IMPRESSION: Nonobstructed bowel-gas pattern Electronically Signed   By: Donavan Foil M.D.   On: 01/05/2017 14:11    Medications: . acetaminophen  1,000 mg Oral Q6H  . estradiol  1 mg Oral Daily  . gabapentin  300 mg Oral QHS  . levothyroxine  75 mcg Oral QAC breakfast  . lip balm  1 application Topical BID  . multivitamins with iron  1 tablet  Oral Daily  . oxyCODONE  5-10 mg Oral Q6H  . potassium chloride  40 mEq Oral Q4H  . pregabalin  50 mg Oral Daily  . psyllium  1 packet Oral Daily  . saccharomyces boulardii  250 mg Oral BID  . sodium chloride flush  3 mL Intravenous Q12H    Assessment/Plan Chronic cholecystitis with calculus s/p lap cholecystectomy, with bleeding from GB fossa 12/25/2016, Dr. Marlou Starks  Home 12/30/16, and readmit with abdominal pain possible cellulitis and leg edema No evidence of DVT on Vascular study Hypokalemia - 40 mg PO this AM Diarrhea  - C diff is negative Post op pain control issues Possible Cirrhosis with chronically abnormal LFT's Hepatosplenomeagly Hypothyroid  Chronic Anemia Chronic neuropathy - on home gabapentin and lyrica  H/o c diff - probiotic Uterine fibroids Hypothyroid - synthroid Tobacco abuse Body mass index is 33.13 kg/m.  ID - ancef perioperative/ceftriaxone 01/02/17/zosyn  FEN - no IV fluids/soft diet VTE - SCDs - anemia   Plan:  I will review with Dr. Redmond Pulling, Dr. [facility] is ready to send her home.      LOS: 3 days    Izeah Vossler 01/06/2017 [phone]

## 2017-01-06 NOTE — Discharge Instructions (Signed)
Follow with Primary MD Minerva Ends, MD in 7 days   Get CBC, CMP,checked  by Primary MD next visit.    Activity: As tolerated with Full fall precautions use walker/cane & assistance as needed   Disposition Home    Diet: Heart Healthy /Soft, with feeding assistance and aspiration precautions.  For Heart failure patients - Check your Weight same time everyday, if you gain over 2 pounds, or you develop in leg swelling, experience more shortness of breath or chest pain, call your Primary MD immediately. Follow Cardiac Low Salt Diet and 1.5 lit/day fluid restriction.   On your next visit with your primary care physician please Get Medicines reviewed and adjusted.   Please request your Prim.MD to go over all Hospital Tests and Procedure/Radiological results at the follow up, please get all Hospital records sent to your Prim MD by signing hospital release before you go home.   If you experience worsening of your admission symptoms, develop shortness of breath, life threatening emergency, suicidal or homicidal thoughts you must seek medical attention immediately by calling 911 or calling your MD immediately  if symptoms less severe.  You Must read complete instructions/literature along with all the possible adverse reactions/side effects for all the Medicines you take and that have been prescribed to you. Take any new Medicines after you have completely understood and accpet all the possible adverse reactions/side effects.   Do not drive, operating heavy machinery, perform activities at heights, swimming or participation in water activities or provide baby sitting services if your were admitted for syncope or siezures until you have seen by Primary MD or a Neurologist and advised to do so again.  Do not drive when taking Pain medications.    Do not take more than prescribed Pain, Sleep and Anxiety Medications  Special Instructions: If you have smoked or chewed Tobacco  in the last 2  yrs please stop smoking, stop any regular Alcohol  and or any Recreational drug use.  Wear Seat belts while driving.   Please note  You were cared for by a hospitalist during your hospital stay. If you have any questions about your discharge medications or the care you received while you were in the hospital after you are discharged, you can call the unit and asked to speak with the hospitalist on call if the hospitalist that took care of you is not available. Once you are discharged, your primary care physician will handle any further medical issues. Please note that NO REFILLS for any discharge medications will be authorized once you are discharged, as it is imperative that you return to your primary care physician (or establish a relationship with a primary care physician if you do not have one) for your aftercare needs so that they can reassess your need for medications and monitor your lab values.

## 2017-01-06 NOTE — Progress Notes (Signed)
Pt discharged to home; discharge instructions explained, given, and signed; all questions answered; prescription given to patient; IV removed; no complications noted

## 2017-01-06 NOTE — Discharge Summary (Signed)
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 StanleyM.D.   On: 12/25/2016 07:16   Ct Angio Abd/pel W And/or Wo Contrast  Result Date: 12/14/2016 CLINICAL DATA:  Chronic escalating abdominal pain for 2 weeks with nausea and vomiting. EXAM: CT ANGIOGRAPHY ABDOMEN AND PELVIS TECHNIQUE: Multidetector CT imaging of the abdomen and pelvis was performed using the standard protocol during bolus administration of intravenous contrast. Multiplanar reconstructed images including MIPs were obtained and reviewed to evaluate the vascular anatomy. CONTRAST:  80 cc Isovue 370 COMPARISON:  12/08/2016 CT without contrast FINDINGS: Arterial findings: Aorta: Intact abdominal aorta. No significant atherosclerotic change. No occlusion, dissection, aneurysm, or retroperitoneal hemorrhage. Celiac axis: Widely patent origin. Splenic, left gastric, hepatic branches are patent. Superior mesenteric:  Widely patent origin. Left renal: Minor origin atherosclerosis. Minimal ostial narrowing without stenosis. Accessory left renal  artery to the lower pole also patent. Right renal:          Widely patent. Inferior mesenteric:  Widely patent. Left iliac: Left common, internal and external iliac arteries are patent. No inflow disease. Right iliac: Right common, internal and external iliac arteries are patent. No inflow disease. Venous findings: Hepatic, portal, splenic, and mesenteric veins are patent. No veno-occlusive process. IVC, and renal veins appear patent. Limited opacification of the iliac and femoral veins. Review of the MIP images confirms the above findings. Nonvascular findings: Bibasilar atelectasis. No significant pleural effusion. No pericardial effusion. Normal heart size. Hepatomegaly evident. Liver measures 23 cm in length. No focal hepatic abnormality or biliary dilatation. Trace amount of pericholecystic fluid, nonspecific. Difficult to exclude mild cholecystitis. Consider further evaluation with ultrasound. Spleen is  enlarged measuring 16 cm in length. No focal splenic abnormality. Pancreas demonstrates normal and without acute process. Normal appearing adrenal glands. Kidneys demonstrate no acute obstruction. Left kidney demonstrates upper pole cortical atrophy appearing chronic. No renal obstruction or hydronephrosis. No obstructing ureteral calculus. No bladder abnormality. Negative bowel obstruction, significant dilatation, ileus, or free air. Appendix is unremarkable without distention or inflammation. No fluid collection or abscess. Trace pelvic free fluid, suspect physiologic. Uterus and adnexal normal in size. No pelvic fluid collection or hemorrhage. No inguinal abnormality or adenopathy.  Intact abdominal wall. No acute osseous finding. IMPRESSION: Patent mesenteric and renal vasculature. Minor abdominal aortic atherosclerosis without acute vascular process. No evidence of mesenteric or renal vascular occlusive process. Hepatosplenomegaly. Bibasilar atelectasis Trace pericholecystic fluid and slight gallbladder  distention, nonspecific but difficult to exclude cholecystitis. Recommend correlation with exam and consider right upper quadrant ultrasound. Electronically Signed   By: Jerilynn Mages.  Shick M.D.   On: 12/14/2016 15:15   US Abdomen Limited Ruq  Result Date: 12/20/2016 CLINICAL DATA:  Abdominal pain since December 01, 2016 EXAM: US ABDOMEN LIMITED - RIGHT UPPER QUADRANT COMPARISON:  12/14/2016 FINDINGS: Gallbladder: Layering sludge. No shadowing gallstone noted. No sonographic Murphy sign or inflammatory wall thickening. Common bile duct: Diameter: 5-6 mm.  Where visualized, no filling defect. Liver: Echogenic compatible with steatosis. No evidence of mass lesion. Antegrade flow in the main portal vein. IMPRESSION: 1. Sludge without shadowing stone.  No signs of cholecystitis. 2. Hepatic steatosis. Electronically Signed   By: Monte Fantasia M.D.   On: 12/20/2016 17:19   US Abdomen Limited Ruq  Result Date: 12/14/2016 CLINICAL DATA:  Right upper quadrant pain for 3 years. EXAM: US ABDOMEN LIMITED - RIGHT UPPER QUADRANT COMPARISON:  CT, 12/14/2016 at 1437 hours FINDINGS: Gallbladder: Gallbladder is distended. There is dependent sludge, but no shadowing stones. Wall is not thickened, but there is mild pericholecystic fluid between the gallbladder and liver. No sonographic Murphy's sign. Common bile duct: Diameter: 4.5 mm Liver: Mildly enlarged with heterogeneous increased echogenicity and an overall coarsened echotexture. No discrete liver mass or focal lesion. IMPRESSION: 1. No evidence of acute cholecystitis.  No bile duct dilation. 2. Gallbladder sludge but no shadowing stones. 3. Pericholecystic edema seen between the gallbladder and liver. This is likely on the basis of liver pathology. 4. Hepatomegaly. Sonographic appearance of the liver suggests heterogeneous fatty infiltration, cirrhosis or a combination. Electronically Signed   By: Lajean Manes M.D.   On: 12/14/2016 17:03    Micro Results     Recent  Results (from the past 240 hour(s))  Blood culture (routine x 2)     Status: None (Preliminary result)   Collection Time: 01/02/17  8:25 PM  Result Value Ref Range Status   Specimen Description BLOOD RIGHT HAND  Final   Special Requests IN PEDIATRIC BOTTLE 2ML  Final   Culture   Final    NO GROWTH 2 DAYS Performed at [facility], [place] [address], [place], [place] [address]    Report Status PENDING  Incomplete  Blood culture (routine x 2)     Status: None (Preliminary result)   Collection Time: 01/02/17  8:30 PM  Result Value Ref Range Status   Specimen Description BLOOD LEFT HAND  Final   Special Requests BOTTLES DRAWN AEROBIC AND ANAEROBIC 5ML  Final   Culture   Final    NO GROWTH 2 DAYS Performed at [facility], [place] [address], [place], [place] [address]  Report Status PENDING  Incomplete  C difficile quick scan w PCR reflex     Status: None   Collection Time: 01/05/17 10:05 AM  Result Value Ref Range Status   C Diff antigen NEGATIVE NEGATIVE Final   C Diff toxin NEGATIVE NEGATIVE Final   C Diff interpretation No C. difficile detected.  Final       Today   Subjective:   Jamie Allen today has no headache,no chest  pain,no new weakness tingling or numbness, feels much better today, Still report some diarrhea, as well her abdominal pain at baseline  Objective:   Blood pressure 112/63, pulse 88, temperature 98.1 F (36.7 C), temperature source Oral, resp. rate 18, height 5' 1"  (1.549 m), weight 79.5 kg (175 lb 5 oz), last menstrual period 06/01/2014, SpO2 92 %.   Intake/Output Summary (Last 24 hours) at 01/06/17 1544 Last data filed at 01/06/17 1400  Gross per 24 hour  Intake             1070 ml  Output              300 ml  Net              770 ml    Exam Awake Alert, Oriented X 3, Extremely anxious Supple Neck,No JVD,  Symmetrical Chest wall movement, Good air movement bilaterally, CTAB RRR,No Gallops,Rubs or new Murmurs,e +ve B. mildly  distended, incision C/D/I,  , patient with ecchymosis and right lower flank, lateral hip, lower abdomen area , Ecchymosis appears to be significantly subsiding ,not warm or swollen to indicate cellulitis ., No rebound - guarding or rigidity. No Cyanosis, Clubbing , right lower extremity +1 edema  Data Review   CBC w Diff: Lab Results  Component Value Date   WBC 3.9 (L) 01/06/2017   HGB 8.4 (L) 01/06/2017   HCT 25.2 (L) 01/06/2017   PLT 108 (L) 01/06/2017   LYMPHOPCT 16 01/02/2017   MONOPCT 7 01/02/2017   EOSPCT 1 01/02/2017   BASOPCT 0 01/02/2017    CMP: Lab Results  Component Value Date   NA 138 01/06/2017   K 3.2 (L) 01/06/2017   CL 109 01/06/2017   CO2 22 01/06/2017   BUN <5 (L) 01/06/2017   CREATININE 0.71 01/06/2017   CREATININE 1.01 05/19/2016   PROT 6.9 01/04/2017   PROT 6.7 10/02/2015   ALBUMIN 3.1 (L) 01/04/2017   BILITOT 1.2 01/04/2017   ALKPHOS 144 (H) 01/04/2017   AST 51 (H) 01/04/2017   ALT 18 01/04/2017  .   Total Time in preparing paper work, data evaluation and todays exam - 35 minutes  Maron Stanzione M.D on 01/06/2017 at 3:44 PM  [facility]   Office  [phone]

## 2017-01-06 NOTE — Progress Notes (Signed)
PT Cancellation Note  [name] MRN: [id number] DOB: 12-May-1971   Cancelled Treatment:     this morning pt asked that I return "later".  Checked back after lunch, pt had amb with staff and currently wants to take a nap.  Pt has been evaluated by LPT with no rec past acute stay.  D/C back home.   Rica Koyanagi  PTA [facility]  Acute  Rehab Pager      [phone]

## 2017-01-08 ENCOUNTER — Encounter: Payer: Self-pay | Admitting: Physician Assistant

## 2017-01-08 ENCOUNTER — Ambulatory Visit: Payer: Self-pay | Attending: Family Medicine | Admitting: Physician Assistant

## 2017-01-08 VITALS — BP 112/71 | HR 100 | Temp 98.3°F | Resp 16 | Wt 153.8 lb

## 2017-01-08 DIAGNOSIS — Z79899 Other long term (current) drug therapy: Secondary | ICD-10-CM | POA: Insufficient documentation

## 2017-01-08 DIAGNOSIS — K7469 Other cirrhosis of liver: Secondary | ICD-10-CM | POA: Insufficient documentation

## 2017-01-08 DIAGNOSIS — Z5189 Encounter for other specified aftercare: Secondary | ICD-10-CM | POA: Insufficient documentation

## 2017-01-08 DIAGNOSIS — K219 Gastro-esophageal reflux disease without esophagitis: Secondary | ICD-10-CM | POA: Insufficient documentation

## 2017-01-08 DIAGNOSIS — I10 Essential (primary) hypertension: Secondary | ICD-10-CM | POA: Insufficient documentation

## 2017-01-08 DIAGNOSIS — Z79891 Long term (current) use of opiate analgesic: Secondary | ICD-10-CM | POA: Insufficient documentation

## 2017-01-08 DIAGNOSIS — G629 Polyneuropathy, unspecified: Secondary | ICD-10-CM | POA: Insufficient documentation

## 2017-01-08 DIAGNOSIS — K8010 Calculus of gallbladder with chronic cholecystitis without obstruction: Secondary | ICD-10-CM

## 2017-01-08 DIAGNOSIS — K7460 Unspecified cirrhosis of liver: Secondary | ICD-10-CM

## 2017-01-08 DIAGNOSIS — D649 Anemia, unspecified: Secondary | ICD-10-CM | POA: Insufficient documentation

## 2017-01-08 DIAGNOSIS — E876 Hypokalemia: Secondary | ICD-10-CM | POA: Insufficient documentation

## 2017-01-08 LAB — CULTURE, BLOOD (ROUTINE X 2)
CULTURE: NO GROWTH
CULTURE: NO GROWTH
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
## 2017-01-10 ENCOUNTER — Inpatient Hospital Stay (HOSPITAL_COMMUNITY)
Admission: EM | Admit: 2017-01-10 | Discharge: 2017-01-13 | DRG: 392 | Discharge status: Against Medical Advice | Payer: Self-pay | Attending: Emergency Medicine | Admitting: Emergency Medicine

## 2017-01-10 ENCOUNTER — Encounter (HOSPITAL_COMMUNITY): Payer: Self-pay | Admitting: Emergency Medicine

## 2017-01-10 ENCOUNTER — Emergency Department (HOSPITAL_COMMUNITY): Payer: Self-pay

## 2017-01-10 DIAGNOSIS — D649 Anemia, unspecified: Secondary | ICD-10-CM | POA: Diagnosis present

## 2017-01-10 DIAGNOSIS — I10 Essential (primary) hypertension: Secondary | ICD-10-CM | POA: Diagnosis present

## 2017-01-10 DIAGNOSIS — R109 Unspecified abdominal pain: Secondary | ICD-10-CM

## 2017-01-10 DIAGNOSIS — G629 Polyneuropathy, unspecified: Secondary | ICD-10-CM | POA: Diagnosis present

## 2017-01-10 DIAGNOSIS — Z9049 Acquired absence of other specified parts of digestive tract: Secondary | ICD-10-CM

## 2017-01-10 DIAGNOSIS — D259 Leiomyoma of uterus, unspecified: Secondary | ICD-10-CM | POA: Diagnosis present

## 2017-01-10 DIAGNOSIS — F17210 Nicotine dependence, cigarettes, uncomplicated: Secondary | ICD-10-CM | POA: Diagnosis present

## 2017-01-10 DIAGNOSIS — K9189 Other postprocedural complications and disorders of digestive system: Secondary | ICD-10-CM

## 2017-01-10 DIAGNOSIS — R1030 Lower abdominal pain, unspecified: Principal | ICD-10-CM | POA: Diagnosis present

## 2017-01-10 DIAGNOSIS — Z885 Allergy status to narcotic agent status: Secondary | ICD-10-CM

## 2017-01-10 DIAGNOSIS — R1084 Generalized abdominal pain: Secondary | ICD-10-CM

## 2017-01-10 DIAGNOSIS — K838 Other specified diseases of biliary tract: Secondary | ICD-10-CM

## 2017-01-10 DIAGNOSIS — G8929 Other chronic pain: Secondary | ICD-10-CM | POA: Diagnosis present

## 2017-01-10 DIAGNOSIS — E876 Hypokalemia: Secondary | ICD-10-CM | POA: Diagnosis present

## 2017-01-10 DIAGNOSIS — Z6833 Body mass index (BMI) 33.0-33.9, adult: Secondary | ICD-10-CM

## 2017-01-10 DIAGNOSIS — E039 Hypothyroidism, unspecified: Secondary | ICD-10-CM | POA: Diagnosis present

## 2017-01-10 DIAGNOSIS — Z79899 Other long term (current) drug therapy: Secondary | ICD-10-CM

## 2017-01-10 DIAGNOSIS — K219 Gastro-esophageal reflux disease without esophagitis: Secondary | ICD-10-CM | POA: Diagnosis present

## 2017-01-10 DIAGNOSIS — R1011 Right upper quadrant pain: Secondary | ICD-10-CM

## 2017-01-10 DIAGNOSIS — Z9889 Other specified postprocedural states: Secondary | ICD-10-CM

## 2017-01-10 DIAGNOSIS — E669 Obesity, unspecified: Secondary | ICD-10-CM | POA: Diagnosis present

## 2017-01-10 LAB — COMPREHENSIVE METABOLIC PANEL
ALBUMIN: 3.6 g/dL (ref 3.5–5.0)
ALK PHOS: 176 U/L — AB (ref 38–126)
ALT: 24 U/L (ref 14–54)
ANION GAP: 11 (ref 5–15)
AST: 95 U/L — ABNORMAL HIGH (ref 15–41)
BILIRUBIN TOTAL: 1.1 mg/dL (ref 0.3–1.2)
BUN: <5 mg/dL — ABNORMAL LOW (ref 6–20)
CALCIUM: 9.1 mg/dL (ref 8.9–10.3)
CO2: 19 mmol/L — ABNORMAL LOW (ref 22–32)
Chloride: 110 mmol/L (ref 101–111)
Creatinine, Ser: 0.82 mg/dL (ref 0.44–1.00)
GLUCOSE: 85 mg/dL (ref 65–99)
Potassium: 3 mmol/L — ABNORMAL LOW (ref 3.5–5.1)
Sodium: 140 mmol/L (ref 135–145)
TOTAL PROTEIN: 8 g/dL (ref 6.5–8.1)

## 2017-01-10 LAB — CBC WITH DIFFERENTIAL/PLATELET
BASOS ABS: 0.1 10*3/uL (ref 0.0–0.1)
Basophils Relative: 1 %
EOS ABS: 0.1 10*3/uL (ref 0.0–0.7)
Eosinophils Relative: 2 %
HCT: 29.8 % — ABNORMAL LOW (ref 36.0–46.0)
Hemoglobin: 9.7 g/dL — ABNORMAL LOW (ref 12.0–15.0)
LYMPHS ABS: 2 10*3/uL (ref 0.7–4.0)
Lymphocytes Relative: 34 %
MCH: 32.8 pg (ref 26.0–34.0)
MCHC: 32.6 g/dL (ref 30.0–36.0)
MCV: 100.7 fL — ABNORMAL HIGH (ref 78.0–100.0)
MONO ABS: 0.4 10*3/uL (ref 0.1–1.0)
MONOS PCT: 7 %
NEUTROS ABS: 3.2 10*3/uL (ref 1.7–7.7)
Neutrophils Relative %: 56 %
Platelets: 190 10*3/uL (ref 150–400)
RBC: 2.96 MIL/uL — AB (ref 3.87–5.11)
RDW: 22.9 % — AB (ref 11.5–15.5)
WBC: 5.8 10*3/uL (ref 4.0–10.5)

## 2017-01-10 LAB — URINALYSIS, ROUTINE W REFLEX MICROSCOPIC
BILIRUBIN URINE: NEGATIVE
GLUCOSE, UA: NEGATIVE mg/dL
Hgb urine dipstick: NEGATIVE
KETONES UR: NEGATIVE mg/dL
Leukocytes, UA: NEGATIVE
NITRITE: NEGATIVE
PH: 5 (ref 5.0–8.0)
Protein, ur: NEGATIVE mg/dL
Specific Gravity, Urine: 1.009 (ref 1.005–1.030)

## 2017-01-10 LAB — LIPASE, BLOOD: LIPASE: 78 U/L — AB (ref 11–51)

## 2017-01-10 MED ORDER — HYDROMORPHONE HCL 1 MG/ML IJ SOLN
1.0000 mg | Freq: Once | INTRAMUSCULAR | Status: AC
  Administered 2017-01-10: 1 mg via INTRAVENOUS
  Filled 2017-01-10: qty 1

## 2017-01-10 MED ORDER — ONDANSETRON HCL 4 MG/2ML IJ SOLN
4.0000 mg | Freq: Once | INTRAMUSCULAR | Status: AC
  Administered 2017-01-10: 4 mg via INTRAVENOUS
  Filled 2017-01-10: qty 2

## 2017-01-10 MED ORDER — SODIUM CHLORIDE 0.9 % IV BOLUS (SEPSIS)
1000.0000 mL | Freq: Once | INTRAVENOUS | Status: AC
  Administered 2017-01-10: 1000 mL via INTRAVENOUS

## 2017-01-10 MED ORDER — FENTANYL CITRATE (PF) 100 MCG/2ML IJ SOLN
100.0000 ug | Freq: Once | INTRAMUSCULAR | Status: AC
  Administered 2017-01-10: 100 ug via INTRAVENOUS
  Filled 2017-01-10: qty 2

## 2017-01-10 MED ORDER — DIPHENHYDRAMINE HCL 50 MG/ML IJ SOLN
50.0000 mg | Freq: Once | INTRAMUSCULAR | Status: AC
  Administered 2017-01-10: 50 mg via INTRAVENOUS
  Filled 2017-01-10: qty 1

## 2017-01-10 MED ORDER — IOPAMIDOL (ISOVUE-300) INJECTION 61%
INTRAVENOUS | Status: AC
  Filled 2017-01-10: qty 100

## 2017-01-10 MED ORDER — IOPAMIDOL (ISOVUE-300) INJECTION 61%
100.0000 mL | Freq: Once | INTRAVENOUS | Status: AC | PRN
  Administered 2017-01-11: 100 mL via INTRAVENOUS

## 2017-01-10 NOTE — ED Triage Notes (Signed)
Brought in by EMS from home with c/o progressively worsening abdominal pain with N/V/D.  Pt reported that she has had gall bladder surgery 2 weeks ago.   She has had pain since her hospital discharge but it has gotten its worst tonight.
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
## 2017-01-10 NOTE — ED Notes (Signed)
Bed: WA07 Expected date:  Expected time:  Means of arrival:  Comments: 46 yo F  abd pain s/p GB surgery 2 weeks ago

## 2017-01-11 ENCOUNTER — Inpatient Hospital Stay (HOSPITAL_COMMUNITY): Payer: Self-pay

## 2017-01-11 ENCOUNTER — Encounter (HOSPITAL_COMMUNITY): Payer: Self-pay | Admitting: Surgery

## 2017-01-11 DIAGNOSIS — R109 Unspecified abdominal pain: Secondary | ICD-10-CM

## 2017-01-11 DIAGNOSIS — G8929 Other chronic pain: Secondary | ICD-10-CM | POA: Diagnosis present

## 2017-01-11 LAB — I-STAT CG4 LACTIC ACID, ED: Lactic Acid, Venous: 1.87 mmol/L (ref 0.5–1.9)

## 2017-01-11 MED ORDER — PANTOPRAZOLE SODIUM 40 MG IV SOLR
40.0000 mg | Freq: Every day | INTRAVENOUS | Status: DC
  Administered 2017-01-11: 40 mg via INTRAVENOUS
  Filled 2017-01-11: qty 40

## 2017-01-11 MED ORDER — ONDANSETRON 4 MG PO TBDP: 4.0000 mg | ORAL_TABLET | Freq: Four times a day (QID) | ORAL | Status: DC | PRN

## 2017-01-11 MED ORDER — HYDROMORPHONE HCL 1 MG/ML IJ SOLN
1.0000 mg | Freq: Once | INTRAMUSCULAR | Status: AC
  Administered 2017-01-11: 1 mg via INTRAVENOUS
  Filled 2017-01-11: qty 1

## 2017-01-11 MED ORDER — ACETAMINOPHEN 650 MG RE SUPP: 650.0000 mg | Freq: Four times a day (QID) | RECTAL | Status: DC | PRN

## 2017-01-11 MED ORDER — ONDANSETRON HCL 4 MG/2ML IJ SOLN: 4.0000 mg | Freq: Three times a day (TID) | INTRAMUSCULAR | Status: DC | PRN

## 2017-01-11 MED ORDER — SODIUM CHLORIDE 0.9 % IV SOLN
INTRAVENOUS | Status: DC
  Administered 2017-01-11: 04:00:00 via INTRAVENOUS

## 2017-01-11 MED ORDER — ACETAMINOPHEN 325 MG PO TABS: 650.0000 mg | ORAL_TABLET | Freq: Four times a day (QID) | ORAL | Status: DC | PRN

## 2017-01-11 MED ORDER — HYDROMORPHONE HCL 1 MG/ML IJ SOLN
0.5000 mg | INTRAMUSCULAR | Status: DC | PRN
  Administered 2017-01-11 (×2): 0.5 mg via INTRAVENOUS
  Administered 2017-01-11: 1 mg via INTRAVENOUS
  Administered 2017-01-11: 0.5 mg via INTRAVENOUS
  Administered 2017-01-11: 22:00:00 1 mg via INTRAVENOUS
  Administered 2017-01-11 – 2017-01-12 (×6): 0.5 mg via INTRAVENOUS
  Filled 2017-01-11 (×8): qty 0.5
  Filled 2017-01-11: qty 1
  Filled 2017-01-11: qty 0.5

## 2017-01-11 MED ORDER — CIPROFLOXACIN HCL 750 MG PO TABS
750.0000 mg | ORAL_TABLET | Freq: Two times a day (BID) | ORAL | Status: DC
  Administered 2017-01-11 – 2017-01-13 (×4): 750 mg via ORAL
  Filled 2017-01-11 (×6): qty 1

## 2017-01-11 MED ORDER — ONDANSETRON HCL 4 MG/2ML IJ SOLN
4.0000 mg | Freq: Four times a day (QID) | INTRAMUSCULAR | Status: DC | PRN
  Administered 2017-01-12 – 2017-01-13 (×3): 4 mg via INTRAVENOUS
  Filled 2017-01-11 (×3): qty 2

## 2017-01-11 MED ORDER — TECHNETIUM TC 99M MEBROFENIN IV KIT
5.4000 | PACK | Freq: Once | INTRAVENOUS | Status: AC | PRN
  Administered 2017-01-11: 5.4 via INTRAVENOUS

## 2017-01-11 MED ORDER — HYDROMORPHONE HCL 1 MG/ML IJ SOLN
1.0000 mg | INTRAMUSCULAR | Status: DC | PRN
  Administered 2017-01-11: 04:00:00 1 mg via INTRAVENOUS
  Filled 2017-01-11 (×2): qty 1

## 2017-01-11 MED ORDER — KCL IN DEXTROSE-NACL 20-5-0.45 MEQ/L-%-% IV SOLN
INTRAVENOUS | Status: DC
  Administered 2017-01-11 – 2017-01-12 (×2): via INTRAVENOUS
  Filled 2017-01-11 (×3): qty 1000

## 2017-01-11 MED ORDER — HYDROCODONE-ACETAMINOPHEN 5-325 MG PO TABS
1.0000 | ORAL_TABLET | ORAL | Status: DC | PRN
  Administered 2017-01-13 (×2): 2 via ORAL
  Filled 2017-01-11 (×2): qty 2

## 2017-01-11 NOTE — ED Provider Notes (Signed)
2:12 AM Patient with right upper quadrant tenderness on my examination.  CT scan concerning for loculated fluid collection in the gallbladder bed with small areas of air concerning for postoperative infection.  I will contact general surgery, Dr.Gerkin, for consultation at the bedside.  Pain treated.  Patient will remain nothing by mouth.  IV Zosyn now    Ct Abdomen Pelvis W Contrast  Result Date: 01/11/2017 CLINICAL DATA:  46 year old female with cholecystectomy 2 weeks ago presenting with worsening abdominal pain, nausea vomiting and diarrhea. EXAM: CT ABDOMEN AND PELVIS WITH CONTRAST TECHNIQUE: Multidetector CT imaging of the abdomen and pelvis was performed using the standard protocol following bolus administration of intravenous contrast. CONTRAST:  141m ISOVUE-300 IOPAMIDOL (ISOVUE-300) INJECTION 61% COMPARISON:  Abdominal CT dated 01/03/2017 and 12/14/2016 and MRI dated 12/24/2016 FINDINGS: Lower chest: Linear bibasilar atelectasis/ scarring. The visualized lung bases are otherwise clear. No intra-abdominal free air. There is a trace free fluid anterior to the liver. Hepatobiliary: There is hepatomegaly with diffuse fatty infiltration of the liver. There is mild irregularity of the hepatic contour, enlargement of the left lobe of the liver and caudate concerning for early changes of cirrhosis There is mild intrahepatic biliary ductal dilatation versus small periportal edema. Postsurgical changes of cholecystectomy. There is persistent loculated appearing fluid collection at the cholecystectomy bed measuring 6.8 x 3.0 cm containing multiple pockets of gas. Persistent of this collection without resolution 2 weeks postoperative is suspicious for an infected collection/developing abscess versus biloma. Correlation with clinical exam recommended. A hepatobiliary scintigraphy may provide better evaluation if there is clinical concern for bile leak. There is inflammatory changes of the mesentery in the right  upper abdomen and fat stranding in the porta hepaticus. Pancreas: Unremarkable. No pancreatic ductal dilatation or surrounding inflammatory changes. Spleen: Mildly enlarged spleen measuring up to 14 cm in length. Adrenals/Urinary Tract: The adrenal glands are unremarkable. There is mild right and moderate to severe left renal atrophy. There is no hydronephrosis on either side. There is symmetric uptake and excretion of contrast by both kidneys. The visualized ureters and urinary bladder appear unremarkable. Stomach/Bowel: There is mild inflammatory changes of the second portion of the duodenum, likely reactive to inflammation of the cholecystectomy. Multiple nondilated fluid-filled loops of small bowel noted. There is no evidence of bowel obstruction or active inflammation. Thickened appearance of the colon most likely related to underdistention. There is apparent diffuse submucosal fat deposit along the wall of the colon, likely related to chronic inflammatory process. Normal appendix. Vascular/Lymphatic: The abdominal aorta and IVC appear unremarkable. The origins of the celiac axis, SMA, IMA are patent. No portal venous gas identified. The SMV, splenic vein, and main portal vein are patent. There is no adenopathy. Reproductive: The uterus and ovaries are grossly unremarkable. Other: There is a small fat containing umbilical hernia. Diffuse stranding of the subcutaneous fat in the anterior abdominal and pelvic wall. No fluid collection or abscess. Musculoskeletal: No acute or significant osseous findings. IMPRESSION: 1. Persistent loculated complex collection at the cholecystectomy bed concerning for developing abscess versus biloma. Correlation with clinical exam is recommended. A hepatobiliary scintigraphy may provide better evaluation if there is clinical concern for bile leak. 2. Enlarged and fatty liver with findings concerning for early changes of cirrhosis. Clinical correlation recommended. 3. Mild  inflammatory changes of the second portion of the duodenum, likely reactive to post cholecystectomy changes. No bowel obstruction. Normal appendix. 4. Mild enlargement of the spleen. Electronically Signed   By: ALaren EvertsD.  On: 01/11/2017 01:04      Jola Schmidt, MD 01/11/17 [phone]

## 2017-01-11 NOTE — Progress Notes (Signed)
[id number]- Going through chart, see that patient suppose to received Zosyn, I dont see an order, is this for later on today? Thanks  Personnel officer   Dr. Harlow Asa paged

## 2017-01-11 NOTE — ED Provider Notes (Signed)
2:28 AM Spoke with Dr Harlow Asa who requests temp admission orders, IV abx, and maintence fluids. Will see on the floor first thing in the morning.   No peritonitis on exam. Stable. No signs of sepsis.    Jola Schmidt, MD 01/11/17 [phone]
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
## 2017-01-11 NOTE — Progress Notes (Signed)
Dr. Harlow Asa returned RN page, states will hold off on IV ABX at this time and will check on it when he come in this am to round on patient.

## 2017-01-12 LAB — COMPREHENSIVE METABOLIC PANEL
ALT: 26 U/L (ref 14–54)
AST: 123 U/L — ABNORMAL HIGH (ref 15–41)
Albumin: 3.4 g/dL — ABNORMAL LOW (ref 3.5–5.0)
Alkaline Phosphatase: 195 U/L — ABNORMAL HIGH (ref 38–126)
Anion gap: 10 (ref 5–15)
BILIRUBIN TOTAL: 2.2 mg/dL — AB (ref 0.3–1.2)
CHLORIDE: 109 mmol/L (ref 101–111)
CO2: 18 mmol/L — ABNORMAL LOW (ref 22–32)
CREATININE: 0.84 mg/dL (ref 0.44–1.00)
Calcium: 9.2 mg/dL (ref 8.9–10.3)
Glucose, Bld: 110 mg/dL — ABNORMAL HIGH (ref 65–99)
Potassium: 3.3 mmol/L — ABNORMAL LOW (ref 3.5–5.1)
Sodium: 137 mmol/L (ref 135–145)
TOTAL PROTEIN: 7.6 g/dL (ref 6.5–8.1)

## 2017-01-12 MED ORDER — KCL IN DEXTROSE-NACL 40-5-0.9 MEQ/L-%-% IV SOLN
INTRAVENOUS | Status: DC
  Administered 2017-01-12 – 2017-01-13 (×2): via INTRAVENOUS
  Filled 2017-01-12 (×3): qty 1000

## 2017-01-12 MED ORDER — HYDROMORPHONE HCL 1 MG/ML IJ SOLN: 0.5000 mg | INTRAMUSCULAR | Status: DC | PRN

## 2017-01-12 MED ORDER — ENSURE ENLIVE PO LIQD: 237.0000 mL | Freq: Two times a day (BID) | ORAL | Status: DC

## 2017-01-12 MED ORDER — PANTOPRAZOLE SODIUM 40 MG PO TBEC
40.0000 mg | DELAYED_RELEASE_TABLET | Freq: Every day | ORAL | Status: DC
  Administered 2017-01-12: 40 mg via ORAL
  Filled 2017-01-12: qty 1

## 2017-01-12 MED ORDER — HYDROMORPHONE HCL 1 MG/ML IJ SOLN
0.5000 mg | INTRAMUSCULAR | Status: DC | PRN
  Administered 2017-01-12: 16:00:00 0.5 mg via INTRAVENOUS
  Administered 2017-01-12 (×2): 1 mg via INTRAVENOUS
  Administered 2017-01-12: 18:00:00 0.5 mg via INTRAVENOUS
  Administered 2017-01-13 (×4): 1 mg via INTRAVENOUS
  Filled 2017-01-12 (×3): qty 1
  Filled 2017-01-12: qty 0.5
  Filled 2017-01-12 (×2): qty 1
  Filled 2017-01-12: qty 0.5
  Filled 2017-01-12: qty 1

## 2017-01-12 NOTE — Progress Notes (Signed)
Initial Nutrition Assessment  DOCUMENTATION CODES:   Obesity unspecified  INTERVENTION:   Ensure Enlive po BID, each supplement provides 350 kcal and 20 grams of protein  NUTRITION DIAGNOSIS:   Inadequate oral intake related to acute illness, gallbladder abscess as evidenced by meal completion < 25%.  GOAL:   Patient will meet greater than or equal to 90% of their needs  MONITOR:   PO intake, Supplement acceptance, Weight trends, Labs  REASON FOR ASSESSMENT:   Malnutrition Screening Tool    ASSESSMENT:   46 yo WF with cirrhosis who underwent lap chole 12/25/16.  Readmitted to hospital 01/03/17 with abd wall pain and question of cellulitis, diarrhea.  Now presents to ER with persistent abd pain, mostly in RUQ.  WBC normal.  Afebrile.  CTA shows loculated fluid collection in gallbladder fossa worrisome for abscess versus biloma.    Met with pt and pt's mother in room today. Pt sleeping so history was obtained from pt's mother. Pt's mother reports that has been eating intermittently since last discharge. Pt is documented to be eating 0% meals currently and pt's mother reports that pt started vomiting this morning after drinking coffee. Per chart, pt having diarrhea. Per chart, pt has lost 9lbs(6%) in seven weeks; this is not significant given time frame. RD will order Ensure for this pt. RD asked pt's mother to Encourage supplement intake to preserve lean muscle mass and promote healing. RD will continue to follow for nutritional needs. Pt with hypokalemia today; continue to monitor and supplement per MD discretion.    Medications reviewed and include: ciprofloxacin, protonix, NaCl w/ dextrose and KCl, hydromorphone  Labs reviewed: K 3.0(L), BUN <5(L), AlkPhos 176(H), lipase 78(H), AST 95(H) hgb 9.7(L), Hct 29.8(L)  Nutrition-Focused physical exam completed. Findings are no fat depletion, no muscle depletion, and mild edema in BLE.   Diet Order:  Diet full liquid Room service  appropriate? Yes; Fluid consistency: Thin  Skin:  Wound (see comment) (abdominal incision)  Last BM:  3/24  Height:   Ht Readings from Last 1 Encounters:  01/03/17 _0  (1.549 m)    Weight:   Wt Readings from Last 1 Encounters:  01/08/17 153 lb 12.8 oz (69.8 kg)    Ideal Body Weight:  47.7 kg  BMI:  There is no height or weight on file to calculate BMI.  Estimated Nutritional Needs:   Kcal:  1500-1700kcal/day   Protein:  70-83g/day   Fluid:  >1.5L/day   EDUCATION NEEDS:   No education needs identified at this time  Koleen Distance, RD, LDN Pager #[phone]

## 2017-01-12 NOTE — Progress Notes (Signed)
Subjective: 6.5 mg IV dilaudid yesterday  2 mg so far today.  Only K+ is in the IV.Marland Kitchen No real change from the last admit.  She wants more for pain.  Says her belly is swollen and then gets smaller.  Says the whole thing hurts.  Family doubting her.  She looks fine sitting up, just got hair washed.  Complains when lying down.  Reports some nausea with clears.    Objective: Vital signs in last 24 hours: Temp:  [98.2 F (36.8 C)-99.7 F (37.6 C)] 98.4 F (36.9 C) (03/26 1355) Pulse Rate:  [86-103] 86 (03/26 1355) Resp:  [17-20] 18 (03/26 1355) BP: (100-107)/(50-55) 101/55 (03/26 1355) SpO2:  [94 %-98 %] 96 % (03/26 1355) Weight:  [70.5 kg (155 lb 5.1 oz)] 70.5 kg (155 lb 5.1 oz) (03/26 1115) Last BM Date: 01/12/17  360 Po Voided x 2 recorded BM x 1 recorded Afebrile, VSS WBC 5.8 yesterday  H/H is up some from discharge K+ 3.0 Lipase was up some yesterday 78   HIDA 01/11/17 -  negative CT scan 01/11/17:  Persistent loculated complex collection at the cholecystectomy bed concerning for developing abscess versus biloma. There is persistent loculated appearing fluid collection at the cholecystectomy bed measuring 6.8 x 3.0 cm containing multiple pockets of gas. 2. Enlarged and fatty liver with findings concerning for early changes of cirrhosis. 3. Mild inflammatory changes of the second portion of the duodenum, likely reactive to post cholecystectomy changes. No bowel obstruction. Normal appendix. 4. Mild enlargement of the spleen. CT scan 01/03/17:Fluid and gas in the gallbladder fossa post cholecystectomy. This could be due to Surgicel (if used at surgery) or abscess. Correlation with surgical history needed. Infiltration in the RUQ fat possibly inflammatory. Mild wall thickening of hepatic flexure and right colon may indicate colitis or may be reactive. No significant free fluid or free air. Minimal pelvic free fluid. Edema in the subcutaneous fat.   Intake/Output from previous  day: 03/25 0701 - 03/26 0700 In: 1260 [P.O.:360; I.V.:900] Out: 3 [Urine:2; Stool:1] Intake/Output this shift: Total I/O In: 360 [P.O.:360] Out: 1000 [Urine:1000]  General appearance: alert, cooperative and no distress GI: soft, distended large abdomen, port sites look fine, she isn't really all that tender on exam.   Lab Results:   Recent Labs  01/10/17 2242  WBC 5.8  HGB 9.7*  HCT 29.8*  PLT 190    BMET  Recent Labs  01/10/17 2242  NA 140  K 3.0*  CL 110  CO2 19*  GLUCOSE 85  BUN <5*  CREATININE 0.82  CALCIUM 9.1   PT/INR No results for input(s): LABPROT, INR in the last 72 hours.   Recent Labs Lab 01/10/17 2242  AST 95*  ALT 24  ALKPHOS 176*  BILITOT 1.1  PROT 8.0  ALBUMIN 3.6     Lipase     Component Value Date/Time   LIPASE 78 (H) 01/10/2017 2242     Studies/Results: Nm Hepatobiliary Liver Func  Result Date: 01/11/2017 CLINICAL DATA:  Right upper quadrant pain with nausea and vomiting. Status post recent cholecystectomy EXAM: NUCLEAR MEDICINE HEPATOBILIARY IMAGING TECHNIQUE: Sequential images of the abdomen were obtained out to 120 minutes following intravenous administration of radiopharmaceutical. Views:  Anterior, right lateral right upper quadrant RADIOPHARMACEUTICALS:  5.4 mCi Tc-8m Choletec IV COMPARISON:  None. FINDINGS: Liver uptake is normal. There is prompt visualization of small bowel consistent with patency of the common bile duct. Gallbladder is noted to be absent. There is no ectopic  radiotracer uptake to suggest bile leak. IMPRESSION: No demonstrable bile leak. Gallbladder absent. Common bile duct patent, evidenced by small bowel visualization. Electronically Signed   By: Lowella Grip III M.D.   On: 01/11/2017 13:40   Ct Abdomen Pelvis W Contrast  Result Date: 01/11/2017 CLINICAL DATA:  46 year old female with cholecystectomy 2 weeks ago presenting with worsening abdominal pain, nausea vomiting and diarrhea. EXAM: CT ABDOMEN  AND PELVIS WITH CONTRAST TECHNIQUE: Multidetector CT imaging of the abdomen and pelvis was performed using the standard protocol following bolus administration of intravenous contrast. CONTRAST:  135m ISOVUE-300 IOPAMIDOL (ISOVUE-300) INJECTION 61% COMPARISON:  Abdominal CT dated 01/03/2017 and 12/14/2016 and MRI dated 12/24/2016 FINDINGS: Lower chest: Linear bibasilar atelectasis/ scarring. The visualized lung bases are otherwise clear. No intra-abdominal free air. There is a trace free fluid anterior to the liver. Hepatobiliary: There is hepatomegaly with diffuse fatty infiltration of the liver. There is mild irregularity of the hepatic contour, enlargement of the left lobe of the liver and caudate concerning for early changes of cirrhosis There is mild intrahepatic biliary ductal dilatation versus small periportal edema. Postsurgical changes of cholecystectomy. There is persistent loculated appearing fluid collection at the cholecystectomy bed measuring 6.8 x 3.0 cm containing multiple pockets of gas. Persistent of this collection without resolution 2 weeks postoperative is suspicious for an infected collection/developing abscess versus biloma. Correlation with clinical exam recommended. A hepatobiliary scintigraphy may provide better evaluation if there is clinical concern for bile leak. There is inflammatory changes of the mesentery in the right upper abdomen and fat stranding in the porta hepaticus. Pancreas: Unremarkable. No pancreatic ductal dilatation or surrounding inflammatory changes. Spleen: Mildly enlarged spleen measuring up to 14 cm in length. Adrenals/Urinary Tract: The adrenal glands are unremarkable. There is mild right and moderate to severe left renal atrophy. There is no hydronephrosis on either side. There is symmetric uptake and excretion of contrast by both kidneys. The visualized ureters and urinary bladder appear unremarkable. Stomach/Bowel: There is mild inflammatory changes of the  second portion of the duodenum, likely reactive to inflammation of the cholecystectomy. Multiple nondilated fluid-filled loops of small bowel noted. There is no evidence of bowel obstruction or active inflammation. Thickened appearance of the colon most likely related to underdistention. There is apparent diffuse submucosal fat deposit along the wall of the colon, likely related to chronic inflammatory process. Normal appendix. Vascular/Lymphatic: The abdominal aorta and IVC appear unremarkable. The origins of the celiac axis, SMA, IMA are patent. No portal venous gas identified. The SMV, splenic vein, and main portal vein are patent. There is no adenopathy. Reproductive: The uterus and ovaries are grossly unremarkable. Other: There is a small fat containing umbilical hernia. Diffuse stranding of the subcutaneous fat in the anterior abdominal and pelvic wall. No fluid collection or abscess. Musculoskeletal: No acute or significant osseous findings. IMPRESSION: 1. Persistent loculated complex collection at the cholecystectomy bed concerning for developing abscess versus biloma. Correlation with clinical exam is recommended. A hepatobiliary scintigraphy may provide better evaluation if there is clinical concern for bile leak. 2. Enlarged and fatty liver with findings concerning for early changes of cirrhosis. Clinical correlation recommended. 3. Mild inflammatory changes of the second portion of the duodenum, likely reactive to post cholecystectomy changes. No bowel obstruction. Normal appendix. 4. Mild enlargement of the spleen. Electronically Signed   By: AAnner CreteM.D.   On: 01/11/2017 01:04    Medications: . ciprofloxacin  750 mg Oral BID  . feeding supplement (ENSURE ENLIVE)  237  mL Oral BID BM  . pantoprazole  40 mg Oral QHS   . dextrose 5 % and 0.45 % NaCl with KCl 20 mEq/L 75 mL/hr at 01/12/17 0250    Assessment/Plan Readmit with abdominal pain 01/11/17 Dr. Harlow Asa Chronic cholecystitis with  calculus s/p lap cholecystectomy, with bleeding from GB fossa3/05/2017, Dr. Marlou Starks Home 12/30/16, and readmit with abdominal pain possible cellulitis and leg edema Discharged 01/06/17   No evidence of DVT on Vascular study Hypokalemia  - 3.0 yesterday  Diarrhea  - C diff is negative Post op pain control issues Possible Cirrhosis with chronically abnormal LFT's Hepatosplenomeagly Hypothyroid  Chronic Anemia Chronic neuropathy - onhome gabapentin and lyrica  H/o c diff - probiotic Uterine fibroids Hypothyroid - synthroid Tobacco abuse Body mass index is 33.13 kg/m. ID - ancef perioperative/ceftriaxone 01/02/17/zosyn   Cipro 01/12/27 =>> day 2 FEN - no IV fluids/full liquids  =>> NPO VTE - SCDs - anemia    Plan:  I cannot tell if she has some pancreatitis or not.  She also has that fluid collection in the GB fossa with a normal WBC.  She did have some bleeding so she did receive some surgicel.  "Surgiciflow and Surgicel. After close monitoring for at least 15-20 minutes the bleeding appeared to have stopped and the liver bed appeared hemostatic." for bleeding from a vein along the GB wall. I am going to make her NPO recheck labs add more K+ to the IV.      LOS: 1 day    Camay Pedigo 01/12/2017 [phone]

## 2017-01-13 LAB — CBC
HCT: 24.6 % — ABNORMAL LOW (ref 36.0–46.0)
Hemoglobin: 8.1 g/dL — ABNORMAL LOW (ref 12.0–15.0)
MCH: 33.8 pg (ref 26.0–34.0)
MCHC: 32.9 g/dL (ref 30.0–36.0)
MCV: 102.5 fL — AB (ref 78.0–100.0)
PLATELETS: 129 10*3/uL — AB (ref 150–400)
RBC: 2.4 MIL/uL — ABNORMAL LOW (ref 3.87–5.11)
RDW: 22.1 % — AB (ref 11.5–15.5)
WBC: 4.6 10*3/uL (ref 4.0–10.5)

## 2017-01-13 LAB — LIPASE, BLOOD: Lipase: 20 U/L (ref 11–51)

## 2017-01-13 MED ORDER — SIMETHICONE 80 MG PO CHEW
80.0000 mg | CHEWABLE_TABLET | Freq: Four times a day (QID) | ORAL | Status: DC | PRN
  Administered 2017-01-13: 80 mg via ORAL
  Filled 2017-01-13: qty 1

## 2017-01-13 MED ORDER — HYDROMORPHONE HCL 1 MG/ML IJ SOLN
0.5000 mg | INTRAMUSCULAR | Status: DC | PRN
  Administered 2017-01-13: 11:00:00 1 mg via INTRAVENOUS
  Filled 2017-01-13 (×3): qty 0.5

## 2017-01-13 NOTE — Progress Notes (Signed)
Patient ID: Jamie Allen, female   DOB: 09-03-1971, 46 y.o.   MRN: [id number]  [facility] Surgery Progress Note     Subjective: Patient reports persistent severe lower abdominal pain. Denies upper abdominal pain today. States that dilaudid every 2 hours helps the pain. She did have n/v yesterday but this has resolved. 2 loose stools yesterday, none today.  Describes the pain as squeezing/tight/crampy. Feels like it did when she had c diff in the past. States that prior to her lap chole she had a chronic lower abdominal pain; this pain is back to her baseline.  Objective: Vital signs in last 24 hours: Temp:  [98.4 F (36.9 C)-98.8 F (37.1 C)] 98.8 F (37.1 C) (03/27 0503) Pulse Rate:  [86-93] 92 (03/27 0503) Resp:  [18] 18 (03/27 0503) BP: (95-104)/(44-64) 104/64 (03/27 0503) SpO2:  [94 %-97 %] 94 % (03/27 0503) Weight:  [155 lb 5.1 oz (70.5 kg)] 155 lb 5.1 oz (70.5 kg) (03/26 1115) Last BM Date: 01/12/17  Intake/Output from previous day: 03/26 0701 - 03/27 0700 In: 2296.3 [P.O.:490; I.V.:1806.3] Out: 1000 [Urine:1000] Intake/Output this shift: No intake/output data recorded.  PE: Gen:  Alert, NAD, pleasant Card:  RRR, no M/G/R heard Pulm:  CTAB, no W/R/R, effort normal Abd: Soft, distended/large abdomen, +BS, no HSM, multiple well healed lap incisions C/D/I, no upper abdominal TTP, mild tenderness lower abdomen with no rebound or guarding  Lab Results:   Recent Labs  01/10/17 2242 01/13/17 0424  WBC 5.8 4.6  HGB 9.7* 8.1*  HCT 29.8* 24.6*  PLT 190 129*   BMET  Recent Labs  01/10/17 2242 01/12/17 1505  NA 140 137  K 3.0* 3.3*  CL 110 109  CO2 19* 18*  GLUCOSE 85 110*  BUN <5* <5*  CREATININE 0.82 0.84  CALCIUM 9.1 9.2   PT/INR No results for input(s): LABPROT, INR in the last 72 hours. CMP     Component Value Date/Time   NA 137 01/12/2017 1505   K 3.3 (L) 01/12/2017 1505   CL 109 01/12/2017 1505   CO2 18 (L) 01/12/2017 1505   GLUCOSE  110 (H) 01/12/2017 1505   BUN <5 (L) 01/12/2017 1505   CREATININE 0.84 01/12/2017 1505   CREATININE 1.01 05/19/2016 1639   CALCIUM 9.2 01/12/2017 1505   PROT 7.6 01/12/2017 1505   PROT 6.7 10/02/2015 1455   ALBUMIN 3.4 (L) 01/12/2017 1505   AST 123 (H) 01/12/2017 1505   ALT 26 01/12/2017 1505   ALKPHOS 195 (H) 01/12/2017 1505   BILITOT 2.2 (H) 01/12/2017 1505   GFRNONAA >60 01/12/2017 1505   GFRNONAA 67 05/19/2016 1639   GFRAA >60 01/12/2017 1505   GFRAA 78 05/19/2016 1639   Lipase     Component Value Date/Time   LIPASE 20 01/13/2017 0424       Studies/Results: Nm Hepatobiliary Liver Func  Result Date: 01/11/2017 CLINICAL DATA:  Right upper quadrant pain with nausea and vomiting. Status post recent cholecystectomy EXAM: NUCLEAR MEDICINE HEPATOBILIARY IMAGING TECHNIQUE: Sequential images of the abdomen were obtained out to 120 minutes following intravenous administration of radiopharmaceutical. Views:  Anterior, right lateral right upper quadrant RADIOPHARMACEUTICALS:  5.4 mCi Tc-3m Choletec IV COMPARISON:  None. FINDINGS: Liver uptake is normal. There is prompt visualization of small bowel consistent with patency of the common bile duct. Gallbladder is noted to be absent. There is no ectopic radiotracer uptake to suggest bile leak. IMPRESSION: No demonstrable bile leak. Gallbladder absent. Common bile duct patent, evidenced by  small bowel visualization. Electronically Signed   By: Lowella Grip III M.D.   On: 01/11/2017 13:40    Anti-infectives: Anti-infectives    Start     Dose/Rate Route Frequency Ordered Stop   01/11/17 1800  ciprofloxacin (CIPRO) tablet 750 mg     750 mg Oral 2 times daily 01/11/17 1525         Assessment/Plan Readmit with abdominal pain 01/11/17 Dr. Harlow Asa Chronic cholecystitis with calculus s/p lap cholecystectomy, with bleeding from GB fossa3/05/2017, Dr. Marlou Starks Home 12/30/16, and readmit with abdominal pain possible cellulitis and leg  edema Discharged 01/06/17   No evidence of DVT on Vascular study Hypokalemia  - 3.3 (3/26) Diarrhea - C diff is negative Post op pain control issues Possible Cirrhosis with chronically abnormal LFT's Hepatosplenomeagly Chronic Anemia Chronic neuropathy - onhome gabapentin and lyrica  H/o c diff - probiotic Uterine fibroids Hypothyroid - synthroid Tobacco abuse Body mass index is 33.13 kg/m.  ID - ancef perioperative/ceftriaxone 01/02/17/zosyn   Cipro 01/12/27 =>> day 3 FEN - IVF, clear liquids VTE - SCDs - anemia    Plan:  Lipase is WNL today and patient is not tender in her upper abdomen, only mildly tender in lower abdomen. N/v has resolved. I think she may be trending back to her chronic abdominal pain that would warrant further outpatient workup with GYN and GI. She does state that this pain is similar to when she had c diff in the past; c diff negative but I will check a GI panel. I will advance her to a clear liquid diet and see how she does. Labs in AM   LOS: 2 days    Jerrye Beavers , [facility] Surgery 01/13/2017, 8:07 AM Pager: [phone] Consults: [phone] Mon-Fri 7:00 am-4:30 pm Sat-Sun 7:00 am-11:30 am

## 2017-01-13 NOTE — Progress Notes (Signed)
Patient attempted to leave the unit. Patient stated" I want to smoke a cigarette." Patient made aware they could not leave with out medical staff. Patient became emotional and stated pain was a 10. Patient then left the elevators and went back into her room. BP was 84/70 after arrival into the room. I advised the patient I had to page the PA before giving pain medication. Patient became more emotional and stated she "wanted to leave against medical advise." I explained the importance of staying, and mother at the bedside tried to talk daughter into staying. In 10 minutes patient decided to leave AMA. Manual BP taken before dismissal, 115/46. Iv was removed and paperwork signed. Jamie Gala PA paged a second time.

## 2017-01-19 MED FILL — GABAPENTIN 300 MG CAPSULE: 300 | 30 days supply | Qty: 270 | Fill #1

## 2017-01-19 MED FILL — ?ESTRADIOL 1MG TABLET: 1 | 30 days supply | Qty: 30 | Fill #1

## 2017-01-19 MED FILL — MEDROXYPROGESTERONE 2.5 MG: 2.5 | 30 days supply | Qty: 30 | Fill #1

## 2017-01-21 ENCOUNTER — Other Ambulatory Visit: Payer: Self-pay | Admitting: Family Medicine

## 2017-01-21 DIAGNOSIS — G5793 Unspecified mononeuropathy of bilateral lower limbs: Secondary | ICD-10-CM

## 2017-01-22 NOTE — Discharge Summary (Signed)
[facility] Discharge Summary   Patient ID: Jamie Allen MRN: [id number] DOB/AGE: 03-07-1971 46 y.o.  Admit date: 01/10/2017 Discharge date: 01/13/2017  Admitting Diagnosis: Generalized abdominal pain  Discharge Diagnosis Patient Active Problem List   Diagnosis Date Noted  . Abdominal pain 01/11/2017  . Hepatosplenomegaly 01/03/2017  . Obesity (BMI 30-39.9) 01/03/2017  . Abdominal wall cellulitis 01/03/2017  . Hypomagnesemia 01/03/2017  . GERD (gastroesophageal reflux disease)   . Anxiety   . Chronic cholecystitis with calculus s/p lap cholecystectomy 12/25/2016 12/24/2016  . Postmenopausal syndrome 11/24/2016  . Urinary hesitancy 05/19/2016  . Right leg swelling 05/19/2016  . Weight gain 05/19/2016  . Hypothyroidism 12/06/2015  . Abnormality of gait 10/02/2015  . Paresthesia 10/02/2015  . Neuropathy ([place])   . Weakness   . Intractable pain 09/23/2015  . Vitamin D deficiency 08/28/2015  . Neuropathic pain, leg, bilateral 08/27/2015  . IBS (irritable bowel syndrome) 08/27/2015  . S/P alcohol detoxification 06/11/2014  . Alcohol dependence ([place]) 06/09/2014  . Nonalcoholic steatohepatitis ([place]) 05/30/2014  . Unspecified constipation 05/30/2014  . Other and unspecified ovarian cyst 05/30/2014  . Anxiety and depression 04/19/2014  . Hypokalemia 04/19/2014  . Post-traumatic stress 01/03/2014  . Cirrhosis ([place]) 07/21/2013    Consultants None  Imaging: CT abdomen pelvis w contrast 01/10/17: 1. Persistent loculated complex collection at the cholecystectomy bed concerning for developing abscess versus biloma. Correlation with clinical exam is recommended. A hepatobiliary scintigraphy may provide better evaluation if there is clinical concern for bile leak. 2. Enlarged and fatty liver with findings concerning for early changes of cirrhosis. Clinical correlation recommended.  3. Mild inflammatory changes of the second portion of the duodenum, likely reactive  to post cholecystectomy changes. No bowel obstruction. Normal appendix. 4. Mild enlargement of the spleen.  HIDA 01/11/17: No demonstrable bile leak. Gallbladder absent. Common bile duct patent, evidenced by small bowel visualization.  Procedures None this admission  Hospital Course:  Jamie Allen is a 46yo female PMH cirrhosis who [facility] 01/10/17 with persistent abdominal pain, nausea, and vomiting.  Patient underwent lap chole 12/20/52 which was complicated by a significant amount of bleeding.  She was readmitted to hospital 01/03/17 with abdominal wall pain and question of cellulitis, diarrhea; discharged 3/20 in fair condition. Upon readmission there was concern for bile leak on her CT scan therefore she was admitted for pain control and further workup. WBC WNL and patient was afebrile. HIDA scan was negative for bile leak. Her lipase was mildly elevated at 78, but this returned to WNL the following day. Nausea and vomiting resolved but patient continued to have lower abdominal pain with unknown etiology. Prior to further workup patient decided to leave against medical advice.     Allergies as of 01/13/2017      Reactions   Iohexol Hives, Itching, Swelling   Morphine And Related Other (See Comments)   Unknown, patient is unaware of allergy Can tolerate with Benadryl   Oxycodone Itching, Other (See Comments)   Can tolerate vicodin      Medication List    ASK your doctor about these medications   diazepam 5 MG tablet Commonly known as:  VALIUM Take 1 tablet (5 mg total) by mouth every 12 (twelve) hours as needed for anxiety.   dicyclomine 10 MG capsule Commonly known as:  BENTYL Take 1 capsule (10 mg total) by mouth 4 (four) times daily -  before meals and at bedtime.   estradiol 1 MG tablet Commonly known as:  ESTRACE Take 1 tablet (1 mg total) by mouth daily.   gabapentin 300 MG capsule Commonly known as:  NEURONTIN Take 3 capsules (900 mg total) by mouth 3  (three) times daily.   levothyroxine 50 MCG tablet Commonly known as:  SYNTHROID, LEVOTHROID Take 50 mcg by mouth daily before breakfast.   levothyroxine 75 MCG tablet Commonly known as:  SYNTHROID, LEVOTHROID Take 1 tablet (75 mcg total) by mouth daily before breakfast.   medroxyPROGESTERone 2.5 MG tablet Commonly known as:  PROVERA Take 1 tablet (2.5 mg total) by mouth daily.   methocarbamol 500 MG tablet Commonly known as:  ROBAXIN Take 2 tablets (1,000 mg total) by mouth at bedtime as needed for muscle spasms.   ondansetron 4 MG disintegrating tablet Commonly known as:  ZOFRAN-ODT Take 1 tablet (4 mg total) by mouth every 6 (six) hours as needed for nausea.   oxyCODONE 5 MG immediate release tablet Commonly known as:  Oxy IR/ROXICODONE Take 1 tablet (5 mg total) by mouth every 6 (six) hours as needed for moderate pain or severe pain.   Potassium Chloride ER 20 MEQ Tbcr Take 20 mEq by mouth daily.   pregabalin 50 MG capsule Commonly known as:  LYRICA Take 1 capsule (50 mg total) by mouth 3 (three) times daily. For PASS   saccharomyces boulardii 250 MG capsule Commonly known as:  FLORASTOR Take 1 capsule (250 mg total) by mouth 2 (two) times daily. You can get a probiotic over the counter.          Signed: Jerrye Beavers, [facility] Surgery 01/22/2017, 2:15 PM Pager: [phone] Consults: [phone] Mon-Fri 7:00 am-4:30 pm Sat-Sun 7:00 am-11:30 am

## 2017-01-23 ENCOUNTER — Other Ambulatory Visit: Payer: Self-pay

## 2017-01-23 DIAGNOSIS — G5793 Unspecified mononeuropathy of bilateral lower limbs: Secondary | ICD-10-CM

## 2017-01-23 MED ORDER — PREGABALIN 50 MG PO CAPS: 50.0000 mg | ORAL_CAPSULE | Freq: Three times a day (TID) | ORAL | 3 refills | Status: DC

## 2017-01-28 ENCOUNTER — Encounter: Payer: Self-pay | Admitting: Gastroenterology

## 2017-01-28 ENCOUNTER — Ambulatory Visit (INDEPENDENT_AMBULATORY_CARE_PROVIDER_SITE_OTHER): Payer: Self-pay | Admitting: Gastroenterology

## 2017-01-28 ENCOUNTER — Other Ambulatory Visit (INDEPENDENT_AMBULATORY_CARE_PROVIDER_SITE_OTHER): Payer: Self-pay

## 2017-01-28 VITALS — BP 112/60 | HR 75 | Ht 61.0 in | Wt 135.8 lb

## 2017-01-28 DIAGNOSIS — K7460 Unspecified cirrhosis of liver: Secondary | ICD-10-CM

## 2017-01-28 DIAGNOSIS — K760 Fatty (change of) liver, not elsewhere classified: Secondary | ICD-10-CM

## 2017-01-28 DIAGNOSIS — R197 Diarrhea, unspecified: Secondary | ICD-10-CM

## 2017-01-28 DIAGNOSIS — R140 Abdominal distension (gaseous): Secondary | ICD-10-CM

## 2017-01-28 LAB — COMPREHENSIVE METABOLIC PANEL
ALBUMIN: 4.1 g/dL (ref 3.5–5.2)
ALK PHOS: 195 U/L — AB (ref 39–117)
ALT: 13 U/L (ref 0–35)
AST: 60 U/L — AB (ref 0–37)
BUN: 4 mg/dL — ABNORMAL LOW (ref 6–23)
CO2: 23 mEq/L (ref 19–32)
CREATININE: 0.74 mg/dL (ref 0.40–1.20)
Calcium: 9.9 mg/dL (ref 8.4–10.5)
Chloride: 97 mEq/L (ref 96–112)
GFR: 89.69 mL/min (ref >60.00)
Glucose, Bld: 113 mg/dL — ABNORMAL HIGH (ref 70–99)
Potassium: 2.9 mEq/L — ABNORMAL LOW (ref 3.5–5.1)
SODIUM: 134 meq/L — AB (ref 135–145)
TOTAL PROTEIN: 8.5 g/dL — AB (ref 6.0–8.3)
Total Bilirubin: 2.1 mg/dL — ABNORMAL HIGH (ref 0.2–1.2)

## 2017-01-28 LAB — CBC WITH DIFFERENTIAL/PLATELET
Basophils Absolute: 0.1 10*3/uL (ref 0.0–0.1)
Basophils Relative: 0.9 % (ref 0.0–3.0)
EOS ABS: 0.2 10*3/uL (ref 0.0–0.7)
EOS PCT: 2.4 % (ref 0.0–5.0)
HEMATOCRIT: 34.4 % — AB (ref 36.0–46.0)
HEMOGLOBIN: 11.6 g/dL — AB (ref 12.0–15.0)
LYMPHS PCT: 17.4 % (ref 12.0–46.0)
Lymphs Abs: 1.2 10*3/uL (ref 0.7–4.0)
MCHC: 33.7 g/dL (ref 30.0–36.0)
MCV: 102.6 fl — ABNORMAL HIGH (ref 78.0–100.0)
MONO ABS: 0.3 10*3/uL (ref 0.1–1.0)
Monocytes Relative: 4.8 % (ref 3.0–12.0)
Neutro Abs: 5 10*3/uL (ref 1.4–7.7)
Neutrophils Relative %: 74.5 % (ref 43.0–77.0)
Platelets: 182 10*3/uL (ref 150.0–400.0)
RBC: 3.35 Mil/uL — AB (ref 3.87–5.11)
RDW: 17.7 % — ABNORMAL HIGH (ref 11.5–15.5)
WBC: 6.7 10*3/uL (ref 4.0–10.5)

## 2017-01-28 LAB — PROTIME-INR
INR: 1.6 ratio — ABNORMAL HIGH (ref 0.8–1.0)
Prothrombin Time: 16.6 s — ABNORMAL HIGH (ref 9.6–13.1)

## 2017-01-28 MED ORDER — COLESTIPOL HCL 1 G PO TABS: 1.0000 g | ORAL_TABLET | Freq: Two times a day (BID) | ORAL | 3 refills | Status: DC

## 2017-01-28 MED ORDER — DICYCLOMINE HCL 10 MG PO CAPS: 10.0000 mg | ORAL_CAPSULE | Freq: Three times a day (TID) | ORAL | 3 refills | Status: DC | PRN
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
## 2017-01-28 NOTE — Patient Instructions (Signed)
If you are age 47 or older, your body mass index should be between 23-30. Your Body mass index is 25.66 kg/m. If this is out of the aforementioned range listed, please consider follow up with your Primary Care Provider.  If you are age 56 or younger, your body mass index should be between 19-25. Your Body mass index is 25.66 kg/m. If this is out of the aformentioned range listed, please consider follow up with your Primary Care Provider.   We have sent the following medications to your pharmacy for you to pick up at your convenience:  Colestid  Bentyl  You have been scheduled for an endoscopy. Please follow written instructions given to you at your visit today. If you use inhalers (even only as needed), please bring them with you on the day of your procedure. Your physician has requested that you go to www.startemmi.com and enter the access code given to you at your visit today. This web site gives a general overview about your procedure. However, you should still follow specific instructions given to you by our office regarding your preparation for the procedure.  Your physician has requested that you go to the basement for lab work before leaving today.  You have been given a Low FodMap to follow.  Thank you

## 2017-01-29 ENCOUNTER — Other Ambulatory Visit: Payer: Self-pay

## 2017-01-29 DIAGNOSIS — R7989 Other specified abnormal findings of blood chemistry: Secondary | ICD-10-CM

## 2017-01-29 DIAGNOSIS — R945 Abnormal results of liver function studies: Principal | ICD-10-CM

## 2017-01-29 LAB — HEPATITIS B SURFACE ANTIBODY,QUALITATIVE: Hep B S Ab: NEGATIVE

## 2017-01-29 LAB — HEPATITIS A ANTIBODY, TOTAL: HEP A TOTAL AB: NONREACTIVE

## 2017-01-30 ENCOUNTER — Other Ambulatory Visit: Payer: Self-pay

## 2017-01-30 DIAGNOSIS — G5793 Unspecified mononeuropathy of bilateral lower limbs: Secondary | ICD-10-CM

## 2017-01-30 MED ORDER — DIAZEPAM 5 MG PO TABS: 5.0000 mg | ORAL_TABLET | Freq: Two times a day (BID) | ORAL | 0 refills | Status: DC | PRN

## 2017-01-30 MED ORDER — ONDANSETRON HCL 4 MG PO TABS: 4.0000 mg | ORAL_TABLET | Freq: Three times a day (TID) | ORAL | 0 refills | Status: DC | PRN

## 2017-02-02 ENCOUNTER — Other Ambulatory Visit: Payer: Self-pay

## 2017-02-03 ENCOUNTER — Other Ambulatory Visit: Payer: Self-pay | Admitting: Family Medicine

## 2017-02-03 DIAGNOSIS — K589 Irritable bowel syndrome without diarrhea: Secondary | ICD-10-CM

## 2017-02-03 MED FILL — ?ONDANSETRON HCL 4 MG TABLE: 4 | 6 days supply | Qty: 20 | Fill #0

## 2017-02-03 MED FILL — diazePAM 5 MG TABS: 5 | 30 days supply | Qty: 60 | Fill #0

## 2017-02-10 ENCOUNTER — Encounter: Payer: Self-pay | Admitting: Family Medicine

## 2017-02-10 ENCOUNTER — Ambulatory Visit: Payer: Self-pay | Attending: Family Medicine | Admitting: Family Medicine

## 2017-02-10 VITALS — BP 122/79 | HR 108 | Temp 98.9°F | Wt 130.2 lb

## 2017-02-10 DIAGNOSIS — N951 Menopausal and female climacteric states: Secondary | ICD-10-CM

## 2017-02-10 DIAGNOSIS — N959 Unspecified menopausal and perimenopausal disorder: Secondary | ICD-10-CM | POA: Insufficient documentation

## 2017-02-10 DIAGNOSIS — K700 Alcoholic fatty liver: Secondary | ICD-10-CM

## 2017-02-10 DIAGNOSIS — E876 Hypokalemia: Secondary | ICD-10-CM | POA: Insufficient documentation

## 2017-02-10 DIAGNOSIS — K7460 Unspecified cirrhosis of liver: Secondary | ICD-10-CM | POA: Insufficient documentation

## 2017-02-10 DIAGNOSIS — F17210 Nicotine dependence, cigarettes, uncomplicated: Secondary | ICD-10-CM | POA: Insufficient documentation

## 2017-02-10 DIAGNOSIS — Z23 Encounter for immunization: Secondary | ICD-10-CM

## 2017-02-10 DIAGNOSIS — R110 Nausea: Secondary | ICD-10-CM

## 2017-02-10 DIAGNOSIS — K8010 Calculus of gallbladder with chronic cholecystitis without obstruction: Secondary | ICD-10-CM

## 2017-02-10 DIAGNOSIS — Z79899 Other long term (current) drug therapy: Secondary | ICD-10-CM | POA: Insufficient documentation

## 2017-02-10 MED ORDER — ONDANSETRON 4 MG PO TBDP
8.0000 mg | ORAL_TABLET | Freq: Once | ORAL | Status: AC
  Administered 2017-02-10: 8 mg via ORAL

## 2017-02-10 MED ORDER — COLESTIPOL HCL 1 G PO TABS: 1.0000 g | ORAL_TABLET | Freq: Two times a day (BID) | ORAL | 3 refills | Status: DC

## 2017-02-10 MED ORDER — POTASSIUM CHLORIDE CRYS ER 20 MEQ PO TBCR: 20.0000 meq | EXTENDED_RELEASE_TABLET | Freq: Two times a day (BID) | ORAL | 0 refills | Status: DC

## 2017-02-10 MED ORDER — PROMETHAZINE HCL 25 MG PO TABS: 25.0000 mg | ORAL_TABLET | Freq: Three times a day (TID) | ORAL | 2 refills | Status: DC | PRN

## 2017-02-10 MED ORDER — ESTRADIOL 1 MG PO TABS: 1.0000 mg | ORAL_TABLET | Freq: Every day | ORAL | 6 refills | Status: DC

## 2017-02-10 MED FILL — COLESTIPOL HCL 1 GM TABLET: 1 | 30 days supply | Qty: 60 | Fill #0

## 2017-02-10 MED FILL — POTASSIUM CL ER 20 MEQ TAB: 20 | 15 days supply | Qty: 30 | Fill #0

## 2017-02-10 MED FILL — ?ESTRADIOL 1MG TABLET: 1 | 30 days supply | Qty: 30 | Fill #0

## 2017-02-10 MED FILL — PROMETHAZINE 25 MG TABLET: 25 | 15 days supply | Qty: 45 | Fill #0

## 2017-02-10 NOTE — Progress Notes (Signed)
Subjective:  Patient ID: Jamie Allen, female    DOB: May 25, 1971  Age: 46 y.o. MRN: [id number]  CC: Hospitalization Follow-up and Hepatic Disease   HPI Taisa Deloria has history of alcohol abuse, presents for    1. Hospitalization follow up: she was initially hospitalized from 12/24/16-12/30/16 for abdominal pain. She had seen in the ED for the same on 2 occassions 2 weeks prior to admission.  She was found to have symptotic gallstones. She underwent laparoscopic cholecystectomy on 12/25/2016. Her course was complicated by intraoperative bleeding requiring 4 U FFP and 2 U pRBC, she developed post-op ileus.  Pertinent imaging: MRCP 12/24/16: 1. Hepatic steatosis with hepatomegaly. 2. No evidence of gallstones. No gallbladder wall thickening or pericholecystic fluid. No biliary ductal dilatation. No choledocholithiasis. 3. Spleen upper limits of normal for size to borderline enlarged.  [name] 12/25/16: Intraoperative cholangiogram demonstrates extrahepatic biliary ducts of unremarkable caliber, with no large filling defect identified. Free flow of contrast across the ampulla.      She returned to the ED on 01/02/17 and was again admitted until 01/06/17 for abdominal pain with abdominal wall swelling and redness. She was treated with IV Zosyn x 1 day. She had hypokalemia on admission of 2.9 that was repleted. She improved symptomatically and was sent home with outpatient GI follow up.  Pertinent imaging: CT Abd/Pelvis with contrast 01/03/17 IMPRESSION: Fluid and gas in the gallbladder fossa post cholecystectomy. This could be due to Surgicel (if used at surgery) or abscess. Correlation with surgical history needed. Infiltration in the RUQ fat possibly inflammatory. Mild wall thickening of hepatic flexure and right colon may indicate colitis or may be reactive. No significant free fluid or free air. Minimal pelvic free fluid. Edema in the subcutaneous fat.  Finally, she returned again to  the ED on 01/10/2017. She hospitalized from 3/24-3/27/18 for generalized abdominal pain. She underwent repeat CT abdomen on 01/10/17 and there was concern for bile leak. She remained afebrile, there was no leukocytosis. HIDA scan was negative for bile leak on 01/11/17. She has mildly elevated lipase. She left AMA.   Since discharge, she has seen GI in follow up for her fatty liver with elevated liver enzymes and splenomegaly concerning for cirrhosis. She is scheduled for EGD nad liver biopsy. She has been advised to have complete alcohol cessation and she reports abstaining. She reports her abdominal pain is moderate, she has nausea. She is worried about cirrhosis and liver failure. She is has started started potassium supplement for hypokalemia stating that her potassium pills were expired. Her last potassium was 2.9. She is hep A and B non-immune and amenable to starting the series today.    1. Postmenopausal syndrome: had hot flashes, weight gain, decreased sex drive. Taking estrace 1 mg daily. 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 Request estrace refill.   Social History  Substance Use Topics  . Smoking status: Current Some Day Smoker    Packs/day: 0.25    Years: 20.00    Types: Cigarettes  . Smokeless tobacco: Never Used  . Alcohol use 12.6 oz/week    21 Glasses of wine per week    Outpatient Medications Prior to Visit  Medication Sig Dispense Refill  . colestipol (COLESTID) 1 g tablet Take 1 tablet (1 g total) by mouth 2 (two) times daily. 60 tablet 3  . diazepam (VALIUM) 5 MG tablet Take 1 tablet (5 mg total) by mouth every 12 (twelve) hours as needed for anxiety. 60 tablet 0  .  dicyclomine (BENTYL) 10 MG capsule Take 1 capsule (10 mg total) by mouth every 8 (eight) hours as needed for spasms. 90 capsule 3  . estradiol (ESTRACE) 1 MG tablet Take 1 tablet (1 mg total) by mouth daily. 30 tablet 6  . gabapentin (NEURONTIN) 300 MG capsule Take 3 capsules (900 mg total) by  mouth 3 (three) times daily. (Patient taking differently: Take 300 mg by mouth at bedtime. ) 270 capsule 2  . levothyroxine (SYNTHROID, LEVOTHROID) 75 MCG tablet Take 1 tablet (75 mcg total) by mouth daily before breakfast. 30 tablet 2  . potassium chloride 20 MEQ TBCR Take 20 mEq by mouth daily. 10 tablet 0  . pregabalin (LYRICA) 50 MG capsule Take 1 capsule (50 mg total) by mouth 3 (three) times daily. For PASS 270 capsule 3  . saccharomyces boulardii (FLORASTOR) 250 MG capsule Take 1 capsule (250 mg total) by mouth 2 (two) times daily. You can get a probiotic over the counter.    . dicyclomine (BENTYL) 10 MG capsule Take 1 capsule (10 mg total) by mouth 4 (four) times daily -  before meals and at bedtime. 56 capsule 0  . medroxyPROGESTERone (PROVERA) 2.5 MG tablet Take 1 tablet (2.5 mg total) by mouth daily. (Patient not taking: Reported on 02/10/2017) 30 tablet 6  . ondansetron (ZOFRAN) 4 MG tablet Take 1 tablet (4 mg total) by mouth every 8 (eight) hours as needed for nausea or vomiting. (Patient not taking: Reported on 02/10/2017) 20 tablet 0  . ondansetron (ZOFRAN) 4 MG tablet TAKE 1 TABLET BY MOUTH EVERY 8 HOURS AS NEEDED FOR NAUSEA OR VOMITING. (Patient not taking: Reported on 02/10/2017) 20 tablet 0   No facility-administered medications prior to visit.     ROS Review of Systems  Constitutional: Negative for chills and fever.  Eyes: Negative for visual disturbance.  Respiratory: Negative for shortness of breath.   Cardiovascular: Negative for chest pain.  Gastrointestinal: Positive for abdominal pain and nausea. Negative for abdominal distention, anal bleeding, blood in stool, constipation, diarrhea, rectal pain and vomiting.  Musculoskeletal: Negative for arthralgias and back pain.  Skin: Negative for rash.  Allergic/Immunologic: Negative for immunocompromised state.  Hematological: Negative for adenopathy. Does not bruise/bleed easily.  Psychiatric/Behavioral: Negative for dysphoric  mood and suicidal ideas.    Objective:  BP 122/79   Pulse (!) 108   Temp 98.9 F (37.2 C) (Oral)   Wt 130 lb 3.2 oz (59.1 kg)   LMP 06/01/2014   SpO2 97%   BMI 24.60 kg/m   BP/Weight 02/10/2017 01/28/2017 3/32/9518  Systolic BP 841 660 630  Diastolic BP 79 60 46  Wt. (Lbs) 130.2 135.8 -  BMI 24.6 25.66 -  Some encounter information is confidential and restricted. Go to Review Flowsheets activity to see all data.    Physical Exam  Constitutional: She is oriented to person, place, and time. She appears well-developed and well-nourished. No distress.  HENT:  Head: Normocephalic and atraumatic.  Cardiovascular: Normal rate, regular rhythm, normal heart sounds and intact distal pulses.   Pulmonary/Chest: Effort normal and breath sounds normal.  Abdominal: Soft. Bowel sounds are normal. She exhibits distension (slight). She exhibits no mass. There is tenderness (mild lower abdominal ). There is no rebound and no guarding.  Musculoskeletal: She exhibits no edema.  Neurological: She is alert and oriented to person, place, and time.  Skin: Skin is warm and dry. No rash noted.  Psychiatric: She has a normal mood and affect.   Lab Results  Component Value Date   TSH 1.004 01/04/2017      Chemistry      Component Value Date/Time   NA 134 (L) 01/28/2017 1005   K 2.9 (L) 01/28/2017 1005   CL 97 01/28/2017 1005   CO2 23 01/28/2017 1005   BUN 4 (L) 01/28/2017 1005   CREATININE 0.74 01/28/2017 1005   CREATININE 1.01 05/19/2016 1639      Component Value Date/Time   CALCIUM 9.9 01/28/2017 1005   ALKPHOS 195 (H) 01/28/2017 1005   AST 60 (H) 01/28/2017 1005   ALT 13 01/28/2017 1005   BILITOT 2.1 (H) 01/28/2017 1005      Assessment & Plan:  Anelle was seen today for hospitalization follow-up and hepatic disease.  Diagnoses and all orders for this visit:  Hypokalemia -     BMP8+EGFR -     Magnesium -     potassium chloride SA (K-DUR,KLOR-CON) 20 MEQ tablet; Take 1 tablet (20  mEq total) by mouth 2 (two) times daily.  Nausea -     ondansetron (ZOFRAN-ODT) disintegrating tablet 8 mg; Take 2 tablets (8 mg total) by mouth once. -     Discontinue: promethazine (PHENERGAN) 25 MG tablet; Take 1 tablet (25 mg total) by mouth every 8 (eight) hours as needed for nausea or vomiting. -     promethazine (PHENERGAN) 25 MG tablet; Take 1 tablet (25 mg total) by mouth every 8 (eight) hours as needed for nausea or vomiting.  Cirrhosis of liver without ascites, unspecified hepatic cirrhosis type (HCC)  Postmenopausal syndrome -     estradiol (ESTRACE) 1 MG tablet; Take 1 tablet (1 mg total) by mouth daily.  Other orders -     Hepatitis A vaccine adult IM -     Hepatitis B vaccine adult IM -     colestipol (COLESTID) 1 g tablet; Take 1 tablet (1 g total) by mouth 2 (two) times daily.  There are no diagnoses linked to this encounter.  No orders of the defined types were placed in this encounter.   Follow-up: Return in about 4 weeks (around 03/10/2017) for hep B and f/u.   Boykin Nearing MD

## 2017-02-10 NOTE — Patient Instructions (Addendum)
Jamie Allen was seen today for hospitalization follow-up and hepatic disease.  Diagnoses and all orders for this visit:  Hypokalemia -     BMP8+EGFR -     Magnesium -     potassium chloride SA (K-DUR,KLOR-CON) 20 MEQ tablet; Take 1 tablet (20 mEq total) by mouth 2 (two) times daily.  Nausea -     ondansetron (ZOFRAN-ODT) disintegrating tablet 8 mg; Take 2 tablets (8 mg total) by mouth once. -     Discontinue: promethazine (PHENERGAN) 25 MG tablet; Take 1 tablet (25 mg total) by mouth every 8 (eight) hours as needed for nausea or vomiting. -     promethazine (PHENERGAN) 25 MG tablet; Take 1 tablet (25 mg total) by mouth every 8 (eight) hours as needed for nausea or vomiting.    Do not take provera Do not drink alcohol Keep salt intake low to prevent swelling in abdomen called ascites and swelling in legs called edema Do not take tylenol  lyrica dose of 50 mg three times daily is safe, this replaces the gabapentin Take potassium twice daily for next 7 days then return for recheck of level, if still low you will take for another week   Hep A and B vaccines given today Hep A is a series of 2 and 0 (now) and 6 months later Hep B is a series of 3 at 0 (now), 1 months later and 6 months later  f/u in 4 weeks for second Hep B vaccine and follow up visit  Dr. Adrian Blackwater

## 2017-02-11 LAB — BMP8+EGFR
BUN/Creatinine Ratio: 4 — ABNORMAL LOW (ref 9–23)
BUN: 3 mg/dL — ABNORMAL LOW (ref 6–24)
CALCIUM: 9.4 mg/dL (ref 8.7–10.2)
CHLORIDE: 95 mmol/L — AB (ref 96–106)
CO2: 19 mmol/L (ref 18–29)
Creatinine, Ser: 0.7 mg/dL (ref 0.57–1.00)
GFR, EST AFRICAN AMERICAN: 120 mL/min/{1.73_m2} (ref >59)
GFR, EST NON AFRICAN AMERICAN: 104 mL/min/{1.73_m2} (ref >59)
Glucose: 121 mg/dL — ABNORMAL HIGH (ref 65–99)
POTASSIUM: 3.3 mmol/L — AB (ref 3.5–5.2)
Sodium: 138 mmol/L (ref 134–144)

## 2017-02-11 LAB — MAGNESIUM: Magnesium: 1.5 mg/dL — ABNORMAL LOW (ref 1.6–2.3)

## 2017-02-12 ENCOUNTER — Encounter: Payer: Self-pay | Admitting: Gastroenterology

## 2017-02-12 ENCOUNTER — Ambulatory Visit (AMBULATORY_SURGERY_CENTER): Payer: Self-pay | Admitting: Gastroenterology

## 2017-02-12 VITALS — BP 101/58 | HR 88 | Temp 98.6°F | Resp 34 | Ht 61.0 in | Wt 135.0 lb

## 2017-02-12 DIAGNOSIS — K2900 Acute gastritis without bleeding: Secondary | ICD-10-CM

## 2017-02-12 DIAGNOSIS — K7469 Other cirrhosis of liver: Secondary | ICD-10-CM

## 2017-02-12 DIAGNOSIS — K700 Alcoholic fatty liver: Secondary | ICD-10-CM | POA: Insufficient documentation

## 2017-02-12 MED ORDER — MAGNESIUM CHLORIDE 64 MG PO TBEC: 1.0000 | DELAYED_RELEASE_TABLET | Freq: Two times a day (BID) | ORAL | 0 refills | Status: DC

## 2017-02-12 MED ORDER — SODIUM CHLORIDE 0.9 % IV SOLN: 500.0000 mL | INTRAVENOUS | Status: DC

## 2017-02-12 NOTE — Assessment & Plan Note (Signed)
Previously on estrace now with concern for cirrhosis Plan D/c estrace Gyn referral

## 2017-02-12 NOTE — Assessment & Plan Note (Signed)
A; history of heavy alcohol use, long standing fatty liver now with evidence of cirrhosis P: D/c estrace, contraindicated in setting of hepatic impairment  Low salt diet Immunize for Hep A and B Patient to follow closely with GI for EGD and liver biopsy to help determine prognosis.

## 2017-02-12 NOTE — Patient Instructions (Signed)
YOU HAD AN ENDOSCOPIC PROCEDURE TODAY AT [facility]:   Refer to the procedure report that was given to you for any specific questions about what was found during the examination.  If the procedure report does not answer your questions, please call your gastroenterologist to clarify.  If you requested that your care partner not be given the details of your procedure findings, then the procedure report has been included in a sealed envelope for you to review at your convenience later.  YOU SHOULD EXPECT: Some feelings of bloating in the abdomen. Passage of more gas than usual.  Walking can help get rid of the air that was put into your GI tract during the procedure and reduce the bloating. If you had a lower endoscopy (such as a colonoscopy or flexible sigmoidoscopy) you may notice spotting of blood in your stool or on the toilet paper. If you underwent a bowel prep for your procedure, you may not have a normal bowel movement for a few days.  Please Note:  You might notice some irritation and congestion in your nose or some drainage.  This is from the oxygen used during your procedure.  There is no need for concern and it should clear up in a day or so.  SYMPTOMS TO REPORT IMMEDIATELY:    Following upper endoscopy (EGD)  Vomiting of blood or coffee ground material  New chest pain or pain under the shoulder blades  Painful or persistently difficult swallowing  New shortness of breath  Fever of 100F or higher  Black, tarry-looking stools  For urgent or emergent issues, a gastroenterologist can be reached at any hour by calling [phone].   DIET:  We do recommend a small meal at first, but then you may proceed to your regular diet.  Drink plenty of fluids but you should avoid alcoholic beverages for 24 hours.  ACTIVITY:  You should plan to take it easy for the rest of today and you should NOT DRIVE or use heavy machinery until tomorrow (because of the sedation medicines used  during the test).    FOLLOW UP: Our staff will call the number listed on your records the next business day following your procedure to check on you and address any questions or concerns that you may have regarding the information given to you following your procedure. If we do not reach you, we will leave a message.  However, if you are feeling well and you are not experiencing any problems, there is no need to return our call.  We will assume that you have returned to your regular daily activities without incident.  If any biopsies were taken you will be contacted by phone or by letter within the next 1-3 weeks.  Please call us at [phone] if you have not heard about the biopsies in 3 weeks.    SIGNATURES/CONFIDENTIALITY: You and/or your care partner have signed paperwork which will be entered into your electronic medical record.  These signatures attest to the fact that that the information above on your After Visit Summary has been reviewed and is understood.  Full responsibility of the confidentiality of this discharge information lies with you and/or your care-partner.  READ all of the handouts given to you by your recovery room nurse.   You will need another EGD in 2 years.

## 2017-02-12 NOTE — Op Note (Signed)
[place] Patient Name: Jamie Allen Procedure Date: 02/12/2017 4:09 PM MRN: [id number] Endoscopist: Remo Lipps P. Armbruster MD, MD Age: 46 Referring MD:  Date of Birth: 08/31/1971 Gender: Female Account #: [id number] Procedure:                Upper GI endoscopy Indications:              suspected cirrhosis, rule out esophageal varices Medicines:                Monitored Anesthesia Care Procedure:                Pre-Anesthesia Assessment:                           - Prior to the procedure, a History and Physical                            was performed, and patient medications and                            allergies were reviewed. The patient's tolerance of                            previous anesthesia was also reviewed. The risks                            and benefits of the procedure and the sedation                            options and risks were discussed with the patient.                            All questions were answered, and informed consent                            was obtained. Prior Anticoagulants: The patient has                            taken no previous anticoagulant or antiplatelet                            agents. ASA Grade Assessment: II - A patient with                            mild systemic disease. After reviewing the risks                            and benefits, the patient was deemed in                            satisfactory condition to undergo the procedure.                           After obtaining informed consent, the endoscope was  passed under direct vision. Throughout the                            procedure, the patient's blood pressure, pulse, and                            oxygen saturations were monitored continuously. The                            Endoscope was introduced through the mouth, and                            advanced to the second part of duodenum. The upper   GI endoscopy was accomplished without difficulty.                            The patient tolerated the procedure well. Scope In: Scope Out: Findings:                 Esophagogastric landmarks were identified: the                            Z-line was found at 34 cm, the gastroesophageal                            junction was found at 34 cm and the upper extent of                            the gastric folds was found at 34 cm from the                            incisors.                           The exam of the esophagus was otherwise normal. No                            esophageal varices were appreciated.                           Diffuse mildly erythematous mucosa with a                            "snakeskin appearance" was found in the gastric                            body and in the gastric antrum, most consistent                            with portal hypertensive gastritis. Biopsies were                            taken with a cold forceps for Helicobacter pylori  testing.                           The exam of the stomach was otherwise normal. No                            gastric varices                           The duodenal bulb and second portion of the                            duodenum were normal. Complications:            No immediate complications. Estimated blood loss:                            Minimal. Estimated Blood Loss:     Estimated blood loss was minimal. Impression:               - Esophagogastric landmarks identified.                           - Normal esophagus otherwise - no varices                            appreciated                           - Erythematous mucosa in the gastric body and                            antrum, suspect portal hypertensive gastritis.                            Biopsied.                           - No gastric varices                           - Normal duodenal bulb and second portion of the                             duodenum. Recommendation:           - Patient has a contact number available for                            emergencies. The signs and symptoms of potential                            delayed complications were discussed with the                            patient. Return to normal activities tomorrow.  Written discharge instructions were provided to the                            patient.                           - Resume previous diet.                           - Continue present medications.                           - Await pathology results.                           - Repeat upper endoscopy in 2 years for screening                            purposes. Remo Lipps P. Armbruster MD, MD 02/12/2017 4:27:56 PM This report has been signed electronically.

## 2017-02-12 NOTE — Progress Notes (Signed)
No egg or soy allergy known to patient - doesnt do well with eggs- causes increased burps  No issues with past sedation with any surgeries  or procedures, no intubation problems  No diet pills per patient No home 02 use per patient  No blood thinners per patient  No A fib or A flutter

## 2017-02-12 NOTE — Progress Notes (Signed)
Called to room to assist during endoscopic procedure.  Patient ID and intended procedure confirmed with present staff. Received instructions for my participation in the procedure from the performing physician.  

## 2017-02-12 NOTE — Progress Notes (Signed)
Report given to PACU, vss 

## 2017-02-12 NOTE — Assessment & Plan Note (Addendum)
Hypokalemia with mild hypomagnesemia  Oral potassium and slow mag ordered

## 2017-02-12 NOTE — Addendum Note (Signed)
Addended by: Boykin Nearing on: 02/12/2017 09:30 PM   Modules accepted: Orders

## 2017-02-13 ENCOUNTER — Telehealth: Payer: Self-pay

## 2017-02-13 ENCOUNTER — Telehealth: Payer: Self-pay | Admitting: *Deleted

## 2017-02-13 MED FILL — LEVOTHYROXINE 75 MCG TABLET: 75 | 30 days supply | Qty: 30 | Fill #2

## 2017-02-13 NOTE — Telephone Encounter (Signed)
  Follow up Call-  Call back number 02/12/2017  Post procedure Call Back phone  # [phone] no message or [phone]  Permission to leave phone message Yes  Some recent data might be hidden     Patient questions:  Do you have a fever, pain , or abdominal swelling? No. Pain Score  0 *  Have you tolerated food without any problems? No. Patient reports nausea and vomiting after her procedure yesterday which has been the normal for her over the last several months. Awaiting bx results from yesterday.  Have you been able to return to your normal activities? Yes.    Do you have any questions about your discharge instructions: Diet   No. Medications  No. Follow up visit  No.  Do you have questions or concerns about your Care? No.  Actions: * If pain score is 4 or above: No action needed, pain <4.

## 2017-02-13 NOTE — Telephone Encounter (Signed)
PT WAS CALLED AND INFORMED OF LAB RESULTS AND MEDICATION BEING SENT TO PHARMACY.

## 2017-02-26 ENCOUNTER — Encounter: Payer: Self-pay | Admitting: Gastroenterology

## 2017-03-03 ENCOUNTER — Telehealth: Payer: Self-pay | Admitting: Family Medicine

## 2017-03-03 NOTE — Telephone Encounter (Signed)
Called back to patient, no answer Requested a call back so more info can be obtained: When did symptoms start?  Any new foods or OTC medicines? Treatment tried so far and response?   She is advised to stop magnesium and potassium as these are the two newest prescribed medications and she may be reacting to them.   She is advised to take benadryl 25-50 mg every 4 hrs as needed up to 300 mg a day but no more.  If she has lip or tongue swelling she is advised to go to ED.   Carilyn Goodpasture, please also f/u with patient if she does not call back by the end of the day.

## 2017-03-03 NOTE — Telephone Encounter (Signed)
Patient called the office asking to speak with nurse in regards to the allergic reaction that she is having that it may be due to medication, but she's not sure. The bumps are spreading (from face to feet) and they are itching. No fever but did vomit. Pt is experiencing weaknes. Please follow up  Thank you.

## 2017-03-03 NOTE — Telephone Encounter (Signed)
Returned patient call, pt states she came in for f/u 2-3 weeks for f/u hospital visit after gallstone  States MD put in order for Hep B and Hep A injection  Pt states she woke up with red spots on her face 2 -3 weeks ago. Need spots appeared everyday.  She washes her face often and still has them all over her cheek, chin,  and nose. She states a week later she noticed her legs was itching and she pulled her pant legs up and noticed her calf was covered with them, covering her thigh.   She state she has taken Benadryl every night.   Heptologist put in order for patient to take Mg and K. She took this April 26.  Denies fluid being drained from spots. She states some are scabbed. Unable to tell if skin is jaundice.

## 2017-03-03 NOTE — Telephone Encounter (Signed)
Pt aware of message,  Denies tongue or facial swelling. Pt advised to go to ED should this happen. Pt also advised to call office back on Thursday if not improving.

## 2017-03-04 ENCOUNTER — Encounter: Payer: Self-pay | Admitting: Family Medicine

## 2017-03-11 ENCOUNTER — Encounter: Payer: Self-pay | Admitting: Physician Assistant

## 2017-03-11 ENCOUNTER — Ambulatory Visit: Payer: Self-pay | Attending: Family Medicine | Admitting: Physician Assistant

## 2017-03-11 VITALS — BP 121/67 | HR 92 | Temp 97.5°F | Wt 131.0 lb

## 2017-03-11 DIAGNOSIS — R21 Rash and other nonspecific skin eruption: Secondary | ICD-10-CM | POA: Insufficient documentation

## 2017-03-11 DIAGNOSIS — D849 Immunodeficiency, unspecified: Secondary | ICD-10-CM | POA: Insufficient documentation

## 2017-03-11 DIAGNOSIS — F419 Anxiety disorder, unspecified: Secondary | ICD-10-CM | POA: Insufficient documentation

## 2017-03-11 DIAGNOSIS — G5792 Unspecified mononeuropathy of left lower limb: Secondary | ICD-10-CM

## 2017-03-11 DIAGNOSIS — K700 Alcoholic fatty liver: Secondary | ICD-10-CM | POA: Insufficient documentation

## 2017-03-11 DIAGNOSIS — G5793 Unspecified mononeuropathy of bilateral lower limbs: Secondary | ICD-10-CM | POA: Insufficient documentation

## 2017-03-11 DIAGNOSIS — G629 Polyneuropathy, unspecified: Secondary | ICD-10-CM | POA: Insufficient documentation

## 2017-03-11 DIAGNOSIS — G5791 Unspecified mononeuropathy of right lower limb: Secondary | ICD-10-CM

## 2017-03-11 DIAGNOSIS — Z9049 Acquired absence of other specified parts of digestive tract: Secondary | ICD-10-CM | POA: Insufficient documentation

## 2017-03-11 DIAGNOSIS — K219 Gastro-esophageal reflux disease without esophagitis: Secondary | ICD-10-CM | POA: Insufficient documentation

## 2017-03-11 MED ORDER — DIAZEPAM 5 MG PO TABS: 5.0000 mg | ORAL_TABLET | Freq: Two times a day (BID) | ORAL | 0 refills | Status: DC | PRN

## 2017-03-11 MED FILL — diazePAM 5 MG TABS: 5 | 30 days supply | Qty: 60 | Fill #0
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
## 2017-03-12 LAB — CBC WITH DIFFERENTIAL/PLATELET
BASOS ABS: 0 10*3/uL (ref 0.0–0.2)
Basos: 0 %
EOS (ABSOLUTE): 0.1 10*3/uL (ref 0.0–0.4)
EOS: 2 %
HEMATOCRIT: 31.3 % — AB (ref 34.0–46.6)
HEMOGLOBIN: 10.2 g/dL — AB (ref 11.1–15.9)
IMMATURE GRANULOCYTES: 0 %
Immature Grans (Abs): 0 10*3/uL (ref 0.0–0.1)
Lymphocytes Absolute: 1.3 10*3/uL (ref 0.7–3.1)
Lymphs: 27 %
MCH: 34.2 pg — ABNORMAL HIGH (ref 26.6–33.0)
MCHC: 32.6 g/dL (ref 31.5–35.7)
MCV: 105 fL — ABNORMAL HIGH (ref 79–97)
MONOCYTES: 4 %
Monocytes Absolute: 0.2 10*3/uL (ref 0.1–0.9)
Neutrophils Absolute: 3.3 10*3/uL (ref 1.4–7.0)
Neutrophils: 67 %
Platelets: 155 10*3/uL (ref 150–379)
RBC: 2.98 x10E6/uL — AB (ref 3.77–5.28)
RDW: 16.3 % — ABNORMAL HIGH (ref 12.3–15.4)
WBC: 5 10*3/uL (ref 3.4–10.8)

## 2017-03-12 LAB — COMPREHENSIVE METABOLIC PANEL
A/G RATIO: 1 — AB (ref 1.2–2.2)
ALT: 38 IU/L — ABNORMAL HIGH (ref 0–32)
AST: 161 IU/L — ABNORMAL HIGH (ref 0–40)
Albumin: 3.6 g/dL (ref 3.5–5.5)
Alkaline Phosphatase: 228 IU/L — ABNORMAL HIGH (ref 39–117)
BUN / CREAT RATIO: 5 — AB (ref 9–23)
BUN: 4 mg/dL — ABNORMAL LOW (ref 6–24)
Bilirubin Total: 1.8 mg/dL — ABNORMAL HIGH (ref 0.0–1.2)
CALCIUM: 8.9 mg/dL (ref 8.7–10.2)
CHLORIDE: 103 mmol/L (ref 96–106)
CO2: 19 mmol/L (ref 18–29)
Creatinine, Ser: 0.82 mg/dL (ref 0.57–1.00)
GFR calc non Af Amer: 86 mL/min/{1.73_m2} (ref >59)
GFR, EST AFRICAN AMERICAN: 99 mL/min/{1.73_m2} (ref >59)
GLOBULIN, TOTAL: 3.6 g/dL (ref 1.5–4.5)
Glucose: 85 mg/dL (ref 65–99)
Potassium: 3.3 mmol/L — ABNORMAL LOW (ref 3.5–5.2)
Sodium: 138 mmol/L (ref 134–144)
Total Protein: 7.2 g/dL (ref 6.0–8.5)

## 2017-03-13 ENCOUNTER — Other Ambulatory Visit: Payer: Self-pay | Admitting: Physician Assistant

## 2017-03-13 LAB — LYME, IGM, EARLY TEST/REFLEX

## 2017-03-13 LAB — ROCKY MTN SPOTTED FVR AB, IGM-BLOOD: RMSF IGM: 1.55 {index} — AB (ref 0.00–0.89)

## 2017-03-13 MED ORDER — FLUCONAZOLE 150 MG PO TABS: 150.0000 mg | ORAL_TABLET | Freq: Once | ORAL | 0 refills | Status: AC

## 2017-03-13 MED ORDER — DOXYCYCLINE HYCLATE 100 MG PO TABS: 100.0000 mg | ORAL_TABLET | Freq: Two times a day (BID) | ORAL | 0 refills | Status: DC

## 2017-03-20 ENCOUNTER — Ambulatory Visit: Payer: Self-pay | Attending: Family Medicine

## 2017-03-20 ENCOUNTER — Other Ambulatory Visit: Payer: Self-pay | Admitting: Family Medicine

## 2017-03-20 ENCOUNTER — Telehealth: Payer: Self-pay

## 2017-03-20 DIAGNOSIS — K589 Irritable bowel syndrome without diarrhea: Secondary | ICD-10-CM

## 2017-03-20 MED ORDER — DOXYCYCLINE HYCLATE 100 MG PO TABS: 100.0000 mg | ORAL_TABLET | Freq: Two times a day (BID) | ORAL | 0 refills | Status: DC

## 2017-03-20 MED FILL — DOXYCYCLINE 100 MG TABLET: 100 | 10 days supply | Qty: 20 | Fill #0

## 2017-03-20 NOTE — Telephone Encounter (Signed)
Contacted pt to go over lab results pt is aware of results and doesn't have any questions or concerns 

## 2017-03-20 NOTE — Telephone Encounter (Signed)
Pt states she didn't start medication for RMSF because she didn't know it was sent to  Different pharmacy. She request medication be sent to [facility]. Will be able to pick up today.

## 2017-03-23 ENCOUNTER — Telehealth: Payer: Self-pay | Admitting: Family Medicine

## 2017-03-23 MED FILL — ONDANSETRON HCL 4 MG TABLET: 4 | 6 days supply | Qty: 20 | Fill #0

## 2017-03-23 NOTE — Telephone Encounter (Signed)
Pt lab work was positive for RMSF. Pt called states she is unable to take doxycycline, d/t bad abdominal cramping. Ensured that she is not consuming ETOH or dairy products with medication. Advised to take medication on empty stomach and to try  Taking dicyclomine that she is prescribe with medication to relieve cramping. Please advise.

## 2017-03-23 NOTE — Telephone Encounter (Signed)
Pt. Called stating that she was diagnosed with [place] fever and she has not been feeling well. Pt. States that she is vomiting. Please f/u with pt.

## 2017-03-23 NOTE — Telephone Encounter (Signed)
Agree with recommendations.  

## 2017-03-24 ENCOUNTER — Telehealth: Payer: Self-pay | Admitting: *Deleted

## 2017-03-24 ENCOUNTER — Telehealth: Payer: Self-pay | Admitting: Gastroenterology

## 2017-03-24 DIAGNOSIS — R110 Nausea: Secondary | ICD-10-CM

## 2017-03-24 MED ORDER — PROMETHAZINE HCL 25 MG PO TABS: 25.0000 mg | ORAL_TABLET | Freq: Three times a day (TID) | ORAL | 2 refills | Status: DC | PRN

## 2017-03-24 NOTE — Telephone Encounter (Signed)
Called to patient verified name and DOB  She report taking the doxycyline  About 5-10 minutes  She develops lower abdominal cramps > 10/10 pain and vomiting  The pain is severe  She has tried pretreating with phenergan and zofran. She has also tried taking bentyl. Nothing has helped.   She does not have fever She reports she never spikes a fever with infection or illness She still has rash on legs   Plan: Advised patient to present to ED for IV therapy for [facility] Spotted Fever   Patient agrees with plan and voices understanding   Her phenergan has been refilled

## 2017-03-24 NOTE — Telephone Encounter (Signed)
Jamie Allen assuming she means [facility] spotted fever which can be a severe infection. I don't see any details of this in her Epic chart, she must be seeing a provider outside our network for it. Assuming she is being treated with antibiotics, she should continue the full course, and continue her other medications otherwise. thanks

## 2017-03-24 NOTE — Telephone Encounter (Signed)
Pt states she has not taken medication today, doubled over in pain and vomiting. She does not have any relief taking Bentyl. Wants to know if another medication to treat RMSF. Please advise.

## 2017-03-24 NOTE — Telephone Encounter (Signed)
Routed to Dr. Armbruster. 

## 2017-03-24 NOTE — Telephone Encounter (Signed)
Spoke to patient, let her know that she should continue with antibiotics and her other medications are okay to continue taking. Patient states that she is very nauseated from the antibiotic, I advised her to get in contact with prescribing doctor. She said that they have had several conversations back and forth already today.

## 2017-03-25 ENCOUNTER — Encounter (HOSPITAL_COMMUNITY): Payer: Self-pay

## 2017-03-25 ENCOUNTER — Emergency Department (HOSPITAL_COMMUNITY)
Admission: EM | Admit: 2017-03-25 | Discharge: 2017-03-25 | Disposition: A | Discharge status: Home / Self Care | Payer: Self-pay | Attending: Emergency Medicine | Admitting: Emergency Medicine

## 2017-03-25 DIAGNOSIS — F17210 Nicotine dependence, cigarettes, uncomplicated: Secondary | ICD-10-CM | POA: Insufficient documentation

## 2017-03-25 DIAGNOSIS — I10 Essential (primary) hypertension: Secondary | ICD-10-CM | POA: Insufficient documentation

## 2017-03-25 DIAGNOSIS — R109 Unspecified abdominal pain: Secondary | ICD-10-CM

## 2017-03-25 DIAGNOSIS — Z79899 Other long term (current) drug therapy: Secondary | ICD-10-CM | POA: Insufficient documentation

## 2017-03-25 DIAGNOSIS — E039 Hypothyroidism, unspecified: Secondary | ICD-10-CM | POA: Insufficient documentation

## 2017-03-25 DIAGNOSIS — R112 Nausea with vomiting, unspecified: Secondary | ICD-10-CM | POA: Insufficient documentation

## 2017-03-25 DIAGNOSIS — A770 Spotted fever due to Rickettsia rickettsii: Secondary | ICD-10-CM | POA: Insufficient documentation

## 2017-03-25 LAB — COMPREHENSIVE METABOLIC PANEL
ALT: 36 U/L (ref 14–54)
AST: 133 U/L — AB (ref 15–41)
Albumin: 3.3 g/dL — ABNORMAL LOW (ref 3.5–5.0)
Alkaline Phosphatase: 176 U/L — ABNORMAL HIGH (ref 38–126)
Anion gap: 11 (ref 5–15)
BILIRUBIN TOTAL: 2.8 mg/dL — AB (ref 0.3–1.2)
CO2: 24 mmol/L (ref 22–32)
CREATININE: 1.05 mg/dL — AB (ref 0.44–1.00)
Calcium: 9.2 mg/dL (ref 8.9–10.3)
Chloride: 103 mmol/L (ref 101–111)
GFR calc Af Amer: >60 mL/min (ref >60)
GLUCOSE: 109 mg/dL — AB (ref 65–99)
Potassium: 3.2 mmol/L — ABNORMAL LOW (ref 3.5–5.1)
Sodium: 138 mmol/L (ref 135–145)
TOTAL PROTEIN: 8 g/dL (ref 6.5–8.1)

## 2017-03-25 LAB — CBC
HCT: 32.8 % — ABNORMAL LOW (ref 36.0–46.0)
Hemoglobin: 10.8 g/dL — ABNORMAL LOW (ref 12.0–15.0)
MCH: 35 pg — ABNORMAL HIGH (ref 26.0–34.0)
MCHC: 32.9 g/dL (ref 30.0–36.0)
MCV: 106.1 fL — ABNORMAL HIGH (ref 78.0–100.0)
PLATELETS: 142 10*3/uL — AB (ref 150–400)
RBC: 3.09 MIL/uL — ABNORMAL LOW (ref 3.87–5.11)
RDW: 15.7 % — AB (ref 11.5–15.5)
WBC: 4.3 10*3/uL (ref 4.0–10.5)

## 2017-03-25 LAB — LIPASE, BLOOD: Lipase: 20 U/L (ref 11–51)

## 2017-03-25 MED ORDER — IBUPROFEN 400 MG PO TABS: 600.0000 mg | ORAL_TABLET | Freq: Once | ORAL | Status: DC

## 2017-03-25 MED ORDER — METOCLOPRAMIDE HCL 10 MG PO TABS: 10.0000 mg | ORAL_TABLET | Freq: Four times a day (QID) | ORAL | 0 refills | Status: DC

## 2017-03-25 MED ORDER — SODIUM CHLORIDE 0.9 % IV BOLUS (SEPSIS)
1000.0000 mL | Freq: Once | INTRAVENOUS | Status: AC
  Administered 2017-03-25: 1000 mL via INTRAVENOUS

## 2017-03-25 MED ORDER — METOCLOPRAMIDE HCL 5 MG/ML IJ SOLN
10.0000 mg | Freq: Once | INTRAMUSCULAR | Status: AC
  Administered 2017-03-25: 10 mg via INTRAVENOUS
  Filled 2017-03-25: qty 2

## 2017-03-25 NOTE — Discharge Instructions (Signed)
Please read and follow all provided instructions.  Your diagnoses today include:  1. [facility] spotted fever   2. Abdominal cramping   3. Non-intractable vomiting with nausea, unspecified vomiting type     Tests performed today include: Vital signs. See below for your results today.   Medications prescribed:  Take as prescribed   Home care instructions:  Follow any educational materials contained in this packet.  Follow-up instructions: Please follow-up with your primary care provider for further evaluation of symptoms and treatment   Return instructions:  Please return to the Emergency Department if you do not get better, if you get worse, or new symptoms OR  - Fever (temperature greater than 101.56F)  - Bleeding that does not stop with holding pressure to the area    -Severe pain (please note that you may be more sore the day after your accident)  - Chest Pain  - Difficulty breathing  - Severe nausea or vomiting  - Inability to tolerate food and liquids  - Passing out  - Skin becoming red around your wounds  - Change in mental status (confusion or lethargy)  - New numbness or weakness    Please return if you have any other emergent concerns.  Additional Information:  Your vital signs today were: BP (!) 109/53 (BP Location: Right Arm)    Pulse (!) 103    Resp 20    LMP 06/01/2014    SpO2 95%  If your blood pressure (BP) was elevated above 135/85 this visit, please have this repeated by your doctor within one month. ---------------

## 2017-03-25 NOTE — ED Triage Notes (Signed)
Pt states that she was recently diagnosed with [place] spotted fever and on doxycycline. Pt has been having abd pain and emesis for the past three days since starting the doxy. Vomiting x 4 and diarrhea x 3 in the past 24 hours

## 2017-03-25 NOTE — ED Notes (Signed)
Requested meds for abdominal cramping from PA.
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
## 2017-04-06 ENCOUNTER — Encounter: Payer: Self-pay | Admitting: Family Medicine

## 2017-04-06 ENCOUNTER — Ambulatory Visit: Payer: Self-pay | Attending: Family Medicine | Admitting: Family Medicine

## 2017-04-06 ENCOUNTER — Encounter: Payer: Self-pay | Admitting: Neurology

## 2017-04-06 VITALS — BP 99/63 | HR 85 | Temp 98.0°F | Wt 130.4 lb

## 2017-04-06 DIAGNOSIS — A770 Spotted fever due to Rickettsia rickettsii: Secondary | ICD-10-CM

## 2017-04-06 DIAGNOSIS — E876 Hypokalemia: Secondary | ICD-10-CM

## 2017-04-06 DIAGNOSIS — E034 Atrophy of thyroid (acquired): Secondary | ICD-10-CM

## 2017-04-06 DIAGNOSIS — M25571 Pain in right ankle and joints of right foot: Secondary | ICD-10-CM

## 2017-04-06 DIAGNOSIS — R339 Retention of urine, unspecified: Secondary | ICD-10-CM

## 2017-04-06 DIAGNOSIS — R3911 Hesitancy of micturition: Secondary | ICD-10-CM

## 2017-04-06 DIAGNOSIS — G5791 Unspecified mononeuropathy of right lower limb: Secondary | ICD-10-CM

## 2017-04-06 DIAGNOSIS — R21 Rash and other nonspecific skin eruption: Secondary | ICD-10-CM | POA: Insufficient documentation

## 2017-04-06 DIAGNOSIS — Z79899 Other long term (current) drug therapy: Secondary | ICD-10-CM | POA: Insufficient documentation

## 2017-04-06 DIAGNOSIS — G5793 Unspecified mononeuropathy of bilateral lower limbs: Secondary | ICD-10-CM

## 2017-04-06 DIAGNOSIS — G5792 Unspecified mononeuropathy of left lower limb: Secondary | ICD-10-CM

## 2017-04-06 DIAGNOSIS — F17210 Nicotine dependence, cigarettes, uncomplicated: Secondary | ICD-10-CM | POA: Insufficient documentation

## 2017-04-06 MED ORDER — METOCLOPRAMIDE HCL 10 MG PO TABS: 10.0000 mg | ORAL_TABLET | Freq: Four times a day (QID) | ORAL | 0 refills | Status: DC

## 2017-04-06 MED ORDER — DOXYCYCLINE HYCLATE 100 MG PO TABS: 100.0000 mg | ORAL_TABLET | Freq: Two times a day (BID) | ORAL | 0 refills | Status: DC

## 2017-04-06 MED ORDER — LEVOTHYROXINE SODIUM 75 MCG PO TABS: 75.0000 ug | ORAL_TABLET | Freq: Every day | ORAL | 2 refills | Status: DC

## 2017-04-06 MED FILL — LEVOTHYROXINE 75 MCG TABLET: 75 | 30 days supply | Qty: 30 | Fill #0

## 2017-04-06 MED FILL — ?DOXYCYCLINE HYCLATE 100 MG: 100 | 14 days supply | Qty: 28 | Fill #0

## 2017-04-06 MED FILL — METOCLOPRAMIDE 10 MG TABLET: 10 | 7 days supply | Qty: 30 | Fill #0

## 2017-04-06 NOTE — Patient Instructions (Addendum)
Jamie Allen was seen today for rash.  Diagnoses and all orders for this visit:  [facility] spotted fever -     Discontinue: metoCLOPramide (REGLAN) 10 MG tablet; Take 1 tablet (10 mg total) by mouth every 6 (six) hours. -     Discontinue: doxycycline (VIBRA-TABS) 100 MG tablet; Take 1 tablet (100 mg total) by mouth 2 (two) times daily. -     CBC with Differential -     metoCLOPramide (REGLAN) 10 MG tablet; Take 1 tablet (10 mg total) by mouth every 6 (six) hours. -     doxycycline (VIBRA-TABS) 100 MG tablet; Take 1 tablet (100 mg total) by mouth 2 (two) times daily.  Acute right ankle pain -     DG Ankle Complete Right; Future  Hypothyroidism due to acquired atrophy of thyroid -     levothyroxine (SYNTHROID, LEVOTHROID) 75 MCG tablet; Take 1 tablet (75 mcg total) by mouth daily before breakfast. -     TSH  Urinary retention -     Basic metabolic panel -     POCT urinalysis dipstick -     US Pelvis Limited; Future  Neuropathic pain, leg, bilateral -     Ambulatory referral to Neurology   F/u in 10 days for [place] spotted fever   Dr. Adrian Blackwater

## 2017-04-06 NOTE — Assessment & Plan Note (Signed)
Rash wo fever initiialy improved then worsened after stopping doxycycline Plan: Repeat doxycycline with reglan CBC X-ray of R ankle due to pain and mild swelling

## 2017-04-06 NOTE — Progress Notes (Signed)
Subjective:  Patient ID: Jamie Allen, female    DOB: 1971-07-16  Age: 46 y.o. MRN: [id number]  CC: Rash   HPI Jamie Allen has history of alcohol abuse, neuropathic pain in legs, hypothyroidism she presents for    1. [place] spotted fever: diagnosed on  03/11/17 following 1-2 weeks of rash. She was afebrile. confirmed with serology. She was prescribed doxycycline. She developed nausea, vomiting and abdominal pain. She went to ED on 03/25/2017 to be evaluated for possible IV antibiotic therapy. She was treated with IV fluids and IV reglan and discharged home with instructions to take doxycyline and reglan. She completed the course 3 days ago. She then developed recurrent rash on legs. With pain and redness along R lateral ankle. No fever. No reports feeling unsteady on her feet. Having difficulty passing her urine.    Social History  Substance Use Topics  . Smoking status: Current Some Day Smoker    Packs/day: 0.25    Years: 20.00    Types: Cigarettes  . Smokeless tobacco: Never Used     Comment: 4 cigs a day   . Alcohol use No     Comment: no etoh now- used to be 21 glasses wine a week, did 1 beer a day but not doing that now either     Outpatient Medications Prior to Visit  Medication Sig Dispense Refill  . diazepam (VALIUM) 5 MG tablet Take 1 tablet (5 mg total) by mouth every 12 (twelve) hours as needed for anxiety. 60 tablet 0  . dicyclomine (BENTYL) 10 MG capsule Take 1 capsule (10 mg total) by mouth every 8 (eight) hours as needed for spasms. 90 capsule 3  . doxycycline (VIBRA-TABS) 100 MG tablet Take 1 tablet (100 mg total) by mouth 2 (two) times daily. 20 tablet 0  . levothyroxine (SYNTHROID, LEVOTHROID) 75 MCG tablet Take 1 tablet (75 mcg total) by mouth daily before breakfast. 30 tablet 2  . magnesium chloride (SLOW-MAG) 64 MG TBEC SR tablet Take 1 tablet (64 mg total) by mouth 2 (two) times daily. 60 tablet 0  . metoCLOPramide (REGLAN) 10 MG tablet Take 1  tablet (10 mg total) by mouth every 6 (six) hours. 30 tablet 0  . ondansetron (ZOFRAN) 4 MG tablet TAKE 1 TABLET BY MOUTH EVERY 8 HOURS AS NEEDED FOR NAUSEA OR VOMITING. 20 tablet 0  . potassium chloride SA (K-DUR,KLOR-CON) 20 MEQ tablet Take 1 tablet (20 mEq total) by mouth 2 (two) times daily. 30 tablet 0  . pregabalin (LYRICA) 50 MG capsule Take 1 capsule (50 mg total) by mouth 3 (three) times daily. For PASS 270 capsule 3  . promethazine (PHENERGAN) 25 MG tablet Take 1 tablet (25 mg total) by mouth every 8 (eight) hours as needed for nausea or vomiting. 45 tablet 2  . saccharomyces boulardii (FLORASTOR) 250 MG capsule Take 1 capsule (250 mg total) by mouth 2 (two) times daily. You can get a probiotic over the counter.    . colestipol (COLESTID) 1 g tablet Take 1 tablet (1 g total) by mouth 2 (two) times daily. (Patient not taking: Reported on 03/25/2017) 60 tablet 3   Facility-Administered Medications Prior to Visit  Medication Dose Route Frequency Provider Last Rate Last Dose  . 0.9 %  sodium chloride infusion  500 mL Intravenous Continuous Armbruster, Renelda Loma, MD        ROS Review of Systems  Constitutional: Negative for chills and fever.  Eyes: Negative for visual disturbance.  Respiratory: Negative for shortness of breath.   Cardiovascular: Negative for chest pain.  Gastrointestinal: Negative for abdominal distention, abdominal pain, anal bleeding, blood in stool, constipation, diarrhea, nausea, rectal pain and vomiting.  Genitourinary: Positive for decreased urine volume.  Musculoskeletal: Positive for arthralgias (R ankle ). Negative for back pain.  Skin: Positive for rash.  Allergic/Immunologic: Negative for immunocompromised state.  Hematological: Negative for adenopathy. Does not bruise/bleed easily.  Psychiatric/Behavioral: Negative for dysphoric mood and suicidal ideas.    Objective:  BP 99/63   Pulse 85   Temp 98 F (36.7 C) (Oral)   Wt 130 lb 6.4 oz (59.1 kg)    LMP 06/01/2014   SpO2 96%   BMI 24.64 kg/m   BP/Weight 04/06/2017 03/25/2017 7/79/3903  Systolic BP 99 009 233  Diastolic BP 63 56 67  Wt. (Lbs) 130.4 - 131  BMI 24.64 - 24.75  Some encounter information is confidential and restricted. Go to Review Flowsheets activity to see all data.    Physical Exam  Constitutional: She is oriented to person, place, and time. She appears well-developed and well-nourished. No distress.  HENT:  Head: Normocephalic and atraumatic.  Cardiovascular: Normal rate, regular rhythm, normal heart sounds and intact distal pulses.   Pulmonary/Chest: Effort normal and breath sounds normal.  Abdominal: Soft. Bowel sounds are normal. She exhibits distension (slight). She exhibits no mass. There is tenderness (mild lower abdominal ). There is no rebound and no guarding.  Musculoskeletal: She exhibits no edema.  Neurological: She is alert and oriented to person, place, and time.  Skin: Skin is warm and dry. Petechiae and rash noted.  Psychiatric: She has a normal mood and affect.   Lab Results  Component Value Date   TSH 1.004 01/04/2017      Chemistry      Component Value Date/Time   NA 138 03/25/2017 0555   NA 138 03/11/2017 1540   K 3.2 (L) 03/25/2017 0555   CL 103 03/25/2017 0555   CO2 24 03/25/2017 0555   BUN <5 (L) 03/25/2017 0555   BUN 4 (L) 03/11/2017 1540   CREATININE 1.05 (H) 03/25/2017 0555   CREATININE 1.01 05/19/2016 1639      Component Value Date/Time   CALCIUM 9.2 03/25/2017 0555   ALKPHOS 176 (H) 03/25/2017 0555   AST 133 (H) 03/25/2017 0555   ALT 36 03/25/2017 0555   BILITOT 2.8 (H) 03/25/2017 0555   BILITOT 1.8 (H) 03/11/2017 1540     Lab Results  Component Value Date   WBC 4.3 03/25/2017   HGB 10.8 (L) 03/25/2017   HCT 32.8 (L) 03/25/2017   MCV 106.1 (H) 03/25/2017   PLT 142 (L) 03/25/2017     Assessment & Plan:  Jamie Allen was seen today for rash.  Diagnoses and all orders for this visit:  [facility] spotted fever -      Discontinue: metoCLOPramide (REGLAN) 10 MG tablet; Take 1 tablet (10 mg total) by mouth every 6 (six) hours. -     Discontinue: doxycycline (VIBRA-TABS) 100 MG tablet; Take 1 tablet (100 mg total) by mouth 2 (two) times daily. -     CBC with Differential -     metoCLOPramide (REGLAN) 10 MG tablet; Take 1 tablet (10 mg total) by mouth every 6 (six) hours. -     doxycycline (VIBRA-TABS) 100 MG tablet; Take 1 tablet (100 mg total) by mouth 2 (two) times daily.  Acute right ankle pain -     DG Ankle Complete Right; Future  Hypothyroidism due to acquired atrophy of thyroid -     levothyroxine (SYNTHROID, LEVOTHROID) 75 MCG tablet; Take 1 tablet (75 mcg total) by mouth daily before breakfast. -     TSH  Urinary retention -     Basic metabolic panel -     POCT urinalysis dipstick -     US Pelvis Limited; Future  Neuropathic pain, leg, bilateral -     Ambulatory referral to Neurology  There are no diagnoses linked to this encounter.  No orders of the defined types were placed in this encounter.   Follow-up: Return in about 10 days (around 04/16/2017) for RMSF.   Boykin Nearing MD

## 2017-04-06 NOTE — Assessment & Plan Note (Signed)
Limited pelvic US ordered to check bladder volume

## 2017-04-07 ENCOUNTER — Telehealth: Payer: Self-pay

## 2017-04-07 LAB — CBC WITH DIFFERENTIAL/PLATELET
Basophils Absolute: 0 10*3/uL (ref 0.0–0.2)
Basos: 0 %
EOS (ABSOLUTE): 0.2 10*3/uL (ref 0.0–0.4)
Eos: 3 %
HEMATOCRIT: 26.9 % — AB (ref 34.0–46.6)
Hemoglobin: 9.2 g/dL — ABNORMAL LOW (ref 11.1–15.9)
Immature Grans (Abs): 0 10*3/uL (ref 0.0–0.1)
Immature Granulocytes: 0 %
LYMPHS ABS: 1.7 10*3/uL (ref 0.7–3.1)
Lymphs: 29 %
MCH: 35.8 pg — ABNORMAL HIGH (ref 26.6–33.0)
MCHC: 34.2 g/dL (ref 31.5–35.7)
MCV: 105 fL — ABNORMAL HIGH (ref 79–97)
MONOS ABS: 0.3 10*3/uL (ref 0.1–0.9)
Monocytes: 5 %
Neutrophils Absolute: 3.7 10*3/uL (ref 1.4–7.0)
Neutrophils: 63 %
Platelets: 149 10*3/uL — ABNORMAL LOW (ref 150–379)
RBC: 2.57 x10E6/uL — AB (ref 3.77–5.28)
RDW: 18 % — AB (ref 12.3–15.4)
WBC: 5.8 10*3/uL (ref 3.4–10.8)

## 2017-04-07 LAB — BASIC METABOLIC PANEL
BUN / CREAT RATIO: 5 — AB (ref 9–23)
BUN: 4 mg/dL — ABNORMAL LOW (ref 6–24)
CO2: 19 mmol/L — AB (ref 20–29)
Calcium: 9.2 mg/dL (ref 8.7–10.2)
Chloride: 100 mmol/L (ref 96–106)
Creatinine, Ser: 0.74 mg/dL (ref 0.57–1.00)
GFR calc Af Amer: 112 mL/min/{1.73_m2} (ref >59)
GFR, EST NON AFRICAN AMERICAN: 97 mL/min/{1.73_m2} (ref >59)
GLUCOSE: 66 mg/dL (ref 65–99)
POTASSIUM: 2.9 mmol/L — AB (ref 3.5–5.2)
SODIUM: 141 mmol/L (ref 134–144)

## 2017-04-07 LAB — TSH: TSH: 3.66 u[IU]/mL (ref 0.450–4.500)

## 2017-04-07 MED ORDER — POTASSIUM CHLORIDE CRYS ER 20 MEQ PO TBCR: 40.0000 meq | EXTENDED_RELEASE_TABLET | Freq: Two times a day (BID) | ORAL | 0 refills | Status: DC

## 2017-04-07 NOTE — Addendum Note (Signed)
Addended by: Boykin Nearing on: 04/07/2017 11:42 AM   Modules accepted: Orders

## 2017-04-07 NOTE — Telephone Encounter (Signed)
Pt was called and informed of lab results. 

## 2017-04-12 LAB — MAGNESIUM: Magnesium: 2.1 mg/dL (ref 1.6–2.3)

## 2017-04-12 LAB — SPECIMEN STATUS REPORT

## 2017-04-16 ENCOUNTER — Telehealth: Payer: Self-pay | Admitting: Family Medicine

## 2017-04-16 ENCOUNTER — Ambulatory Visit (HOSPITAL_COMMUNITY): Payer: Self-pay

## 2017-04-16 DIAGNOSIS — E876 Hypokalemia: Secondary | ICD-10-CM

## 2017-04-16 NOTE — Telephone Encounter (Signed)
Pt was called and informed of lab results. 

## 2017-04-16 NOTE — Telephone Encounter (Signed)
PT called returning your call for lab, she said she been waiting for 4 days still none call her back please follow up

## 2017-04-17 ENCOUNTER — Other Ambulatory Visit: Payer: Self-pay | Admitting: Physician Assistant

## 2017-04-17 ENCOUNTER — Ambulatory Visit (HOSPITAL_COMMUNITY)
Admission: RE | Admit: 2017-04-17 | Discharge: 2017-04-17 | Disposition: A | Discharge status: Home / Self Care | Payer: Self-pay | Source: Ambulatory Visit | Attending: Family Medicine | Admitting: Family Medicine

## 2017-04-17 DIAGNOSIS — R339 Retention of urine, unspecified: Secondary | ICD-10-CM | POA: Insufficient documentation

## 2017-04-17 DIAGNOSIS — G5793 Unspecified mononeuropathy of bilateral lower limbs: Secondary | ICD-10-CM

## 2017-04-17 MED ORDER — POTASSIUM CHLORIDE CRYS ER 20 MEQ PO TBCR: 40.0000 meq | EXTENDED_RELEASE_TABLET | Freq: Two times a day (BID) | ORAL | 0 refills | Status: DC

## 2017-04-17 NOTE — Telephone Encounter (Signed)
Called patient Verified name and DOB She reports nose bleed last night from R side that lasted most of the night. She reports passing clot  She reports rising from bed this morning and then passing out. She reports she has been having shortness of breath and chest pains  She reports that she has been informed of low potassium and came to pick up potassium but the line was too long.   She was give post void residual results  Large post Void residual Reviewed med list patient taking bentyl which is anticholinergic She was also prescribed valium which is anticholinergic  Patient called to stop bentyl and valium

## 2017-04-21 ENCOUNTER — Other Ambulatory Visit: Payer: Self-pay

## 2017-04-24 ENCOUNTER — Ambulatory Visit: Payer: Self-pay | Admitting: Family Medicine

## 2017-04-24 ENCOUNTER — Other Ambulatory Visit: Payer: Self-pay | Admitting: Family Medicine

## 2017-04-24 DIAGNOSIS — K589 Irritable bowel syndrome without diarrhea: Secondary | ICD-10-CM

## 2017-04-24 MED FILL — PROMETHAZINE 25 MG TABLET: 25 | 15 days supply | Qty: 45 | Fill #1

## 2017-04-27 ENCOUNTER — Other Ambulatory Visit: Payer: Self-pay

## 2017-04-27 DIAGNOSIS — G5793 Unspecified mononeuropathy of bilateral lower limbs: Secondary | ICD-10-CM

## 2017-04-27 MED ORDER — PREGABALIN 50 MG PO CAPS: 50.0000 mg | ORAL_CAPSULE | Freq: Three times a day (TID) | ORAL | 3 refills | Status: DC

## 2017-04-27 MED FILL — ?ONDANSETRON HCL 4 MG TABLE: 4 | 6 days supply | Qty: 20 | Fill #0

## 2017-04-28 ENCOUNTER — Emergency Department (HOSPITAL_COMMUNITY): Payer: Self-pay

## 2017-04-28 ENCOUNTER — Encounter (HOSPITAL_COMMUNITY): Payer: Self-pay

## 2017-04-28 ENCOUNTER — Inpatient Hospital Stay (HOSPITAL_COMMUNITY)
Admission: EM | Admit: 2017-04-28 | Discharge: 2017-05-05 | DRG: 392 | Disposition: A | Discharge status: Home / Self Care | Payer: Self-pay | Attending: Internal Medicine | Admitting: Internal Medicine

## 2017-04-28 DIAGNOSIS — R079 Chest pain, unspecified: Secondary | ICD-10-CM | POA: Diagnosis present

## 2017-04-28 DIAGNOSIS — D696 Thrombocytopenia, unspecified: Secondary | ICD-10-CM | POA: Diagnosis present

## 2017-04-28 DIAGNOSIS — Z91041 Radiographic dye allergy status: Secondary | ICD-10-CM

## 2017-04-28 DIAGNOSIS — K648 Other hemorrhoids: Secondary | ICD-10-CM | POA: Diagnosis present

## 2017-04-28 DIAGNOSIS — E440 Moderate protein-calorie malnutrition: Secondary | ICD-10-CM | POA: Diagnosis present

## 2017-04-28 DIAGNOSIS — K529 Noninfective gastroenteritis and colitis, unspecified: Secondary | ICD-10-CM

## 2017-04-28 DIAGNOSIS — Z8619 Personal history of other infectious and parasitic diseases: Secondary | ICD-10-CM

## 2017-04-28 DIAGNOSIS — D689 Coagulation defect, unspecified: Secondary | ICD-10-CM | POA: Diagnosis present

## 2017-04-28 DIAGNOSIS — F1010 Alcohol abuse, uncomplicated: Secondary | ICD-10-CM | POA: Diagnosis present

## 2017-04-28 DIAGNOSIS — R112 Nausea with vomiting, unspecified: Secondary | ICD-10-CM

## 2017-04-28 DIAGNOSIS — R188 Other ascites: Secondary | ICD-10-CM

## 2017-04-28 DIAGNOSIS — D539 Nutritional anemia, unspecified: Secondary | ICD-10-CM | POA: Diagnosis present

## 2017-04-28 DIAGNOSIS — R109 Unspecified abdominal pain: Secondary | ICD-10-CM

## 2017-04-28 DIAGNOSIS — K7030 Alcoholic cirrhosis of liver without ascites: Secondary | ICD-10-CM

## 2017-04-28 DIAGNOSIS — A09 Infectious gastroenteritis and colitis, unspecified: Principal | ICD-10-CM | POA: Diagnosis present

## 2017-04-28 DIAGNOSIS — I10 Essential (primary) hypertension: Secondary | ICD-10-CM | POA: Diagnosis present

## 2017-04-28 DIAGNOSIS — N390 Urinary tract infection, site not specified: Secondary | ICD-10-CM | POA: Diagnosis present

## 2017-04-28 DIAGNOSIS — R140 Abdominal distension (gaseous): Secondary | ICD-10-CM

## 2017-04-28 DIAGNOSIS — Z79899 Other long term (current) drug therapy: Secondary | ICD-10-CM

## 2017-04-28 DIAGNOSIS — Z6826 Body mass index (BMI) 26.0-26.9, adult: Secondary | ICD-10-CM

## 2017-04-28 DIAGNOSIS — G8929 Other chronic pain: Secondary | ICD-10-CM | POA: Diagnosis present

## 2017-04-28 DIAGNOSIS — E8770 Fluid overload, unspecified: Secondary | ICD-10-CM | POA: Diagnosis present

## 2017-04-28 DIAGNOSIS — K2970 Gastritis, unspecified, without bleeding: Secondary | ICD-10-CM | POA: Diagnosis present

## 2017-04-28 DIAGNOSIS — R197 Diarrhea, unspecified: Secondary | ICD-10-CM

## 2017-04-28 DIAGNOSIS — R933 Abnormal findings on diagnostic imaging of other parts of digestive tract: Secondary | ICD-10-CM

## 2017-04-28 DIAGNOSIS — F4310 Post-traumatic stress disorder, unspecified: Secondary | ICD-10-CM | POA: Diagnosis present

## 2017-04-28 DIAGNOSIS — E860 Dehydration: Secondary | ICD-10-CM | POA: Diagnosis present

## 2017-04-28 DIAGNOSIS — Z885 Allergy status to narcotic agent status: Secondary | ICD-10-CM

## 2017-04-28 DIAGNOSIS — K7460 Unspecified cirrhosis of liver: Secondary | ICD-10-CM | POA: Diagnosis present

## 2017-04-28 DIAGNOSIS — I959 Hypotension, unspecified: Secondary | ICD-10-CM | POA: Diagnosis present

## 2017-04-28 LAB — CBC
HCT: 24.8 % — ABNORMAL LOW (ref 36.0–46.0)
HEMOGLOBIN: 8.4 g/dL — AB (ref 12.0–15.0)
MCH: 36.4 pg — ABNORMAL HIGH (ref 26.0–34.0)
MCHC: 33.9 g/dL (ref 30.0–36.0)
MCV: 107.4 fL — ABNORMAL HIGH (ref 78.0–100.0)
PLATELETS: 120 10*3/uL — AB (ref 150–400)
RBC: 2.31 MIL/uL — AB (ref 3.87–5.11)
RDW: 19.8 % — ABNORMAL HIGH (ref 11.5–15.5)
WBC: 6.6 10*3/uL (ref 4.0–10.5)

## 2017-04-28 LAB — LIPASE, BLOOD: Lipase: 23 U/L (ref 11–51)

## 2017-04-28 LAB — COMPREHENSIVE METABOLIC PANEL
ALK PHOS: 174 U/L — AB (ref 38–126)
ALT: 22 U/L (ref 14–54)
ANION GAP: 13 (ref 5–15)
AST: 75 U/L — ABNORMAL HIGH (ref 15–41)
Albumin: 3.2 g/dL — ABNORMAL LOW (ref 3.5–5.0)
BILIRUBIN TOTAL: 2.5 mg/dL — AB (ref 0.3–1.2)
BUN: <5 mg/dL — ABNORMAL LOW (ref 6–20)
CALCIUM: 8.6 mg/dL — AB (ref 8.9–10.3)
CO2: 21 mmol/L — ABNORMAL LOW (ref 22–32)
CREATININE: 0.86 mg/dL (ref 0.44–1.00)
Chloride: 103 mmol/L (ref 101–111)
GFR calc non Af Amer: >60 mL/min (ref >60)
GLUCOSE: 99 mg/dL (ref 65–99)
Potassium: 3.4 mmol/L — ABNORMAL LOW (ref 3.5–5.1)
Sodium: 137 mmol/L (ref 135–145)
TOTAL PROTEIN: 7.3 g/dL (ref 6.5–8.1)

## 2017-04-28 LAB — I-STAT BETA HCG BLOOD, ED (MC, WL, AP ONLY)

## 2017-04-28 MED ORDER — ONDANSETRON HCL 4 MG/2ML IJ SOLN
4.0000 mg | Freq: Once | INTRAMUSCULAR | Status: AC
  Administered 2017-04-28: 4 mg via INTRAVENOUS

## 2017-04-28 MED ORDER — OXYCODONE-ACETAMINOPHEN 5-325 MG PO TABS
ORAL_TABLET | ORAL | Status: AC
  Filled 2017-04-28: qty 1

## 2017-04-28 MED ORDER — OXYCODONE-ACETAMINOPHEN 5-325 MG PO TABS
1.0000 | ORAL_TABLET | ORAL | Status: AC | PRN
  Administered 2017-04-28 – 2017-05-02 (×2): 1 via ORAL
  Filled 2017-04-28 (×2): qty 1

## 2017-04-28 MED ORDER — ONDANSETRON HCL 4 MG/2ML IJ SOLN
INTRAMUSCULAR | Status: AC
  Filled 2017-04-28: qty 2

## 2017-04-28 MED ORDER — METRONIDAZOLE IN NACL 5-0.79 MG/ML-% IV SOLN
500.0000 mg | Freq: Three times a day (TID) | INTRAVENOUS | Status: DC
  Administered 2017-04-28 – 2017-04-30 (×5): 500 mg via INTRAVENOUS
  Filled 2017-04-28 (×6): qty 100

## 2017-04-28 MED ORDER — ONDANSETRON 4 MG PO TBDP
4.0000 mg | ORAL_TABLET | Freq: Once | ORAL | Status: AC | PRN
  Administered 2017-04-28: 4 mg via ORAL

## 2017-04-28 MED ORDER — KETOROLAC TROMETHAMINE 30 MG/ML IJ SOLN
30.0000 mg | Freq: Once | INTRAMUSCULAR | Status: AC
  Administered 2017-04-28: 30 mg via INTRAVENOUS
  Filled 2017-04-28: qty 1

## 2017-04-28 MED ORDER — SODIUM CHLORIDE 0.9 % IV BOLUS (SEPSIS)
1000.0000 mL | Freq: Once | INTRAVENOUS | Status: AC
  Administered 2017-04-28: 1000 mL via INTRAVENOUS

## 2017-04-28 MED ORDER — HALOPERIDOL LACTATE 5 MG/ML IJ SOLN
5.0000 mg | Freq: Once | INTRAMUSCULAR | Status: AC
  Administered 2017-04-28: 5 mg via INTRAVENOUS
  Filled 2017-04-28: qty 1

## 2017-04-28 MED ORDER — HYDROMORPHONE HCL 1 MG/ML IJ SOLN
1.0000 mg | Freq: Once | INTRAMUSCULAR | Status: AC
  Administered 2017-04-28: 1 mg via INTRAVENOUS
  Filled 2017-04-28: qty 1

## 2017-04-28 MED ORDER — ONDANSETRON 4 MG PO TBDP
ORAL_TABLET | ORAL | Status: AC
  Filled 2017-04-28: qty 1

## 2017-04-28 MED ORDER — VANCOMYCIN 50 MG/ML ORAL SOLUTION
500.0000 mg | Freq: Four times a day (QID) | ORAL | Status: DC
  Administered 2017-04-29: 500 mg via ORAL
  Filled 2017-04-28 (×2): qty 10

## 2017-04-28 MED ORDER — DICYCLOMINE HCL 10 MG/ML IM SOLN
20.0000 mg | Freq: Once | INTRAMUSCULAR | Status: AC
  Administered 2017-04-28: 20 mg via INTRAMUSCULAR
  Filled 2017-04-28: qty 2

## 2017-04-28 MED ORDER — BARIUM SULFATE 2.1 % PO SUSP
ORAL | Status: AC
  Filled 2017-04-28: qty 2

## 2017-04-28 NOTE — ED Triage Notes (Signed)
Pt presents to the er with complaints of pain in her right upper quadrant that started yesterday. Pt reports having her gallbladder taken out about 8 months ago. Endorses nausea and vomiting also.

## 2017-04-28 NOTE — ED Notes (Signed)
Pt unable to void at this time. 

## 2017-04-28 NOTE — ED Notes (Signed)
Pt threw up po zofran and percocet, vo from dr Zenia Resides for iv zofran, will repeat pain medication when pt is not actively vomiting.

## 2017-04-29 ENCOUNTER — Encounter (HOSPITAL_COMMUNITY): Payer: Self-pay | Admitting: Physician Assistant

## 2017-04-29 DIAGNOSIS — I959 Hypotension, unspecified: Secondary | ICD-10-CM | POA: Diagnosis present

## 2017-04-29 DIAGNOSIS — R933 Abnormal findings on diagnostic imaging of other parts of digestive tract: Secondary | ICD-10-CM

## 2017-04-29 DIAGNOSIS — K7460 Unspecified cirrhosis of liver: Secondary | ICD-10-CM

## 2017-04-29 DIAGNOSIS — R1084 Generalized abdominal pain: Secondary | ICD-10-CM

## 2017-04-29 DIAGNOSIS — R197 Diarrhea, unspecified: Secondary | ICD-10-CM

## 2017-04-29 DIAGNOSIS — R112 Nausea with vomiting, unspecified: Secondary | ICD-10-CM

## 2017-04-29 LAB — BASIC METABOLIC PANEL
Anion gap: 9 (ref 5–15)
CALCIUM: 7.4 mg/dL — AB (ref 8.9–10.3)
CHLORIDE: 108 mmol/L (ref 101–111)
CO2: 21 mmol/L — AB (ref 22–32)
CREATININE: 0.88 mg/dL (ref 0.44–1.00)
GFR calc Af Amer: >60 mL/min (ref >60)
GFR calc non Af Amer: >60 mL/min (ref >60)
GLUCOSE: 82 mg/dL (ref 65–99)
Potassium: 3.2 mmol/L — ABNORMAL LOW (ref 3.5–5.1)
Sodium: 138 mmol/L (ref 135–145)

## 2017-04-29 LAB — DIC (DISSEMINATED INTRAVASCULAR COAGULATION) PANEL
APTT: 48 s — AB (ref 24–36)
FIBRINOGEN: 267 mg/dL (ref 210–475)
INR: 1.81
PLATELETS: 94 10*3/uL — AB (ref 150–400)

## 2017-04-29 LAB — GASTROINTESTINAL PANEL BY PCR, STOOL (REPLACES STOOL CULTURE)

## 2017-04-29 LAB — URINALYSIS, ROUTINE W REFLEX MICROSCOPIC
BILIRUBIN URINE: NEGATIVE
GLUCOSE, UA: NEGATIVE mg/dL
KETONES UR: NEGATIVE mg/dL
Nitrite: NEGATIVE
PH: 6 (ref 5.0–8.0)
Protein, ur: 30 mg/dL — AB
SQUAMOUS EPITHELIAL / LPF: NONE SEEN
Specific Gravity, Urine: 1.008 (ref 1.005–1.030)

## 2017-04-29 LAB — HEMOGLOBIN AND HEMATOCRIT, BLOOD
HCT: 23.3 % — ABNORMAL LOW (ref 36.0–46.0)
HEMOGLOBIN: 7.8 g/dL — AB (ref 12.0–15.0)

## 2017-04-29 LAB — C DIFFICILE QUICK SCREEN W PCR REFLEX
C DIFFICILE (CDIFF) INTERP: NOT DETECTED
C DIFFICILE (CDIFF) TOXIN: NEGATIVE
C DIFFICLE (CDIFF) ANTIGEN: NEGATIVE

## 2017-04-29 LAB — LACTIC ACID, PLASMA
LACTIC ACID, VENOUS: 2.1 mmol/L — AB (ref 0.5–1.9)
LACTIC ACID, VENOUS: 1.5 mmol/L (ref 0.5–1.9)

## 2017-04-29 LAB — PREPARE RBC (CROSSMATCH)

## 2017-04-29 LAB — RAPID URINE DRUG SCREEN, HOSP PERFORMED
Amphetamines: NOT DETECTED
BENZODIAZEPINES: POSITIVE — AB
Barbiturates: NOT DETECTED
COCAINE: NOT DETECTED
Opiates: POSITIVE — AB
Tetrahydrocannabinol: NOT DETECTED

## 2017-04-29 LAB — CBC
HEMATOCRIT: 20.3 % — AB (ref 36.0–46.0)
HEMOGLOBIN: 6.9 g/dL — AB (ref 12.0–15.0)
MCH: 37.1 pg — AB (ref 26.0–34.0)
MCHC: 34 g/dL (ref 30.0–36.0)
MCV: 109.1 fL — ABNORMAL HIGH (ref 78.0–100.0)
Platelets: 89 10*3/uL — ABNORMAL LOW (ref 150–400)
RBC: 1.86 MIL/uL — ABNORMAL LOW (ref 3.87–5.11)
RDW: 20.5 % — AB (ref 11.5–15.5)
WBC: 4.6 10*3/uL (ref 4.0–10.5)

## 2017-04-29 LAB — DIC (DISSEMINATED INTRAVASCULAR COAGULATION)PANEL
D-Dimer, Quant: 1.15 ug/mL-FEU — ABNORMAL HIGH (ref 0.00–0.50)
Prothrombin Time: 21.2 seconds — ABNORMAL HIGH (ref 11.4–15.2)
Smear Review: NONE SEEN

## 2017-04-29 MED ORDER — HYDROMORPHONE HCL 1 MG/ML IJ SOLN
0.5000 mg | INTRAMUSCULAR | Status: DC | PRN
  Administered 2017-04-29: 0.5 mg via INTRAVENOUS
  Administered 2017-04-30: 1 mg via INTRAVENOUS
  Administered 2017-04-30: 0.5 mg via INTRAVENOUS
  Administered 2017-04-30: 1 mg via INTRAVENOUS
  Administered 2017-05-01 – 2017-05-02 (×5): 0.5 mg via INTRAVENOUS
  Administered 2017-05-02: 1 mg via INTRAVENOUS
  Administered 2017-05-02 (×2): 0.5 mg via INTRAVENOUS
  Administered 2017-05-03 (×4): 1 mg via INTRAVENOUS
  Administered 2017-05-03 – 2017-05-05 (×9): 0.5 mg via INTRAVENOUS
  Filled 2017-04-29 (×2): qty 0.5
  Filled 2017-04-29 (×3): qty 1
  Filled 2017-04-29 (×4): qty 0.5
  Filled 2017-04-29: qty 1
  Filled 2017-04-29: qty 0.5
  Filled 2017-04-29: qty 1
  Filled 2017-04-29 (×4): qty 0.5
  Filled 2017-04-29: qty 1
  Filled 2017-04-29: qty 0.5
  Filled 2017-04-29: qty 1
  Filled 2017-04-29: qty 0.5
  Filled 2017-04-29: qty 1
  Filled 2017-04-29 (×4): qty 0.5

## 2017-04-29 MED ORDER — METOCLOPRAMIDE HCL 10 MG PO TABS: 10.0000 mg | ORAL_TABLET | Freq: Four times a day (QID) | ORAL | Status: DC | PRN

## 2017-04-29 MED ORDER — ACETAMINOPHEN 650 MG RE SUPP: 650.0000 mg | Freq: Four times a day (QID) | RECTAL | Status: DC | PRN

## 2017-04-29 MED ORDER — DEXTROSE 5 % IV SOLN
2.0000 g | Freq: Every day | INTRAVENOUS | Status: DC
  Administered 2017-04-29 – 2017-04-30 (×2): 2 g via INTRAVENOUS
  Filled 2017-04-29 (×2): qty 2

## 2017-04-29 MED ORDER — ACETAMINOPHEN 325 MG PO TABS
650.0000 mg | ORAL_TABLET | Freq: Four times a day (QID) | ORAL | Status: DC | PRN
  Administered 2017-04-29: 650 mg via ORAL
  Filled 2017-04-29: qty 2

## 2017-04-29 MED ORDER — SODIUM CHLORIDE 0.9 % IV BOLUS (SEPSIS)
500.0000 mL | Freq: Once | INTRAVENOUS | Status: AC
  Administered 2017-04-29: 500 mL via INTRAVENOUS

## 2017-04-29 MED ORDER — POTASSIUM CHLORIDE CRYS ER 20 MEQ PO TBCR
40.0000 meq | EXTENDED_RELEASE_TABLET | Freq: Two times a day (BID) | ORAL | Status: DC
  Administered 2017-04-29 – 2017-05-05 (×13): 40 meq via ORAL
  Filled 2017-04-29 (×14): qty 2

## 2017-04-29 MED ORDER — ONDANSETRON HCL 4 MG PO TABS
4.0000 mg | ORAL_TABLET | Freq: Four times a day (QID) | ORAL | Status: DC | PRN
  Administered 2017-04-29 – 2017-05-02 (×2): 4 mg via ORAL
  Filled 2017-04-29: qty 1

## 2017-04-29 MED ORDER — SODIUM CHLORIDE 0.9 % IV SOLN
INTRAVENOUS | Status: DC
  Administered 2017-04-29 – 2017-05-03 (×6): via INTRAVENOUS

## 2017-04-29 MED ORDER — SACCHAROMYCES BOULARDII 250 MG PO CAPS
250.0000 mg | ORAL_CAPSULE | ORAL | Status: DC
  Administered 2017-04-29 – 2017-05-05 (×4): 250 mg via ORAL
  Filled 2017-04-29 (×6): qty 1

## 2017-04-29 MED ORDER — HYOSCYAMINE SULFATE 0.125 MG SL SUBL
0.2500 mg | SUBLINGUAL_TABLET | Freq: Once | SUBLINGUAL | Status: AC
  Administered 2017-04-29: 0.25 mg via SUBLINGUAL
  Filled 2017-04-29: qty 2

## 2017-04-29 MED ORDER — KETOROLAC TROMETHAMINE 30 MG/ML IJ SOLN
30.0000 mg | Freq: Four times a day (QID) | INTRAMUSCULAR | Status: AC | PRN
  Administered 2017-04-29 – 2017-05-02 (×6): 30 mg via INTRAVENOUS
  Filled 2017-04-29 (×7): qty 1

## 2017-04-29 MED ORDER — NICOTINE 14 MG/24HR TD PT24
14.0000 mg | MEDICATED_PATCH | Freq: Every day | TRANSDERMAL | Status: DC
  Administered 2017-04-29 – 2017-05-05 (×7): 14 mg via TRANSDERMAL
  Filled 2017-04-29 (×7): qty 1

## 2017-04-29 MED ORDER — SODIUM CHLORIDE 0.9 % IV BOLUS (SEPSIS)
1000.0000 mL | Freq: Once | INTRAVENOUS | Status: AC
  Administered 2017-04-29: 1000 mL via INTRAVENOUS

## 2017-04-29 MED ORDER — DIAZEPAM 5 MG PO TABS
5.0000 mg | ORAL_TABLET | Freq: Two times a day (BID) | ORAL | Status: DC | PRN
  Administered 2017-04-29 – 2017-05-03 (×2): 5 mg via ORAL
  Filled 2017-04-29 (×3): qty 1

## 2017-04-29 MED ORDER — ONDANSETRON HCL 4 MG/2ML IJ SOLN
4.0000 mg | Freq: Four times a day (QID) | INTRAMUSCULAR | Status: DC | PRN
  Administered 2017-04-30 – 2017-05-04 (×5): 4 mg via INTRAVENOUS
  Filled 2017-04-29 (×7): qty 2

## 2017-04-29 MED ORDER — PROMETHAZINE HCL 25 MG PO TABS
25.0000 mg | ORAL_TABLET | Freq: Three times a day (TID) | ORAL | Status: DC | PRN
  Administered 2017-05-04: 25 mg via ORAL
  Filled 2017-04-29: qty 1

## 2017-04-29 MED ORDER — PREGABALIN 25 MG PO CAPS
50.0000 mg | ORAL_CAPSULE | Freq: Three times a day (TID) | ORAL | Status: DC
  Administered 2017-04-29 – 2017-05-05 (×17): 50 mg via ORAL
  Filled 2017-04-29 (×17): qty 2

## 2017-04-29 MED ORDER — BOOST / RESOURCE BREEZE PO LIQD
1.0000 | Freq: Three times a day (TID) | ORAL | Status: DC
  Administered 2017-05-01 – 2017-05-05 (×11): 1 via ORAL

## 2017-04-29 MED ORDER — ENOXAPARIN SODIUM 40 MG/0.4ML ~~LOC~~ SOLN: 40.0000 mg | SUBCUTANEOUS | Status: DC

## 2017-04-29 MED ORDER — GABAPENTIN 600 MG PO TABS
600.0000 mg | ORAL_TABLET | Freq: Every day | ORAL | Status: DC
  Administered 2017-04-29 – 2017-05-04 (×7): 600 mg via ORAL
  Filled 2017-04-29 (×7): qty 1

## 2017-04-29 MED ORDER — HYOSCYAMINE SULFATE 0.125 MG/ML PO SOLN: 0.2500 mg | Freq: Once | ORAL | Status: DC

## 2017-04-29 MED ORDER — LEVOTHYROXINE SODIUM 75 MCG PO TABS
75.0000 ug | ORAL_TABLET | Freq: Every day | ORAL | Status: DC
  Administered 2017-04-29 – 2017-05-05 (×7): 75 ug via ORAL
  Filled 2017-04-29 (×7): qty 1

## 2017-04-29 MED ORDER — MAGNESIUM CHLORIDE 64 MG PO TBEC
1.0000 | DELAYED_RELEASE_TABLET | Freq: Two times a day (BID) | ORAL | Status: DC
  Administered 2017-04-29 – 2017-05-05 (×11): 64 mg via ORAL
  Filled 2017-04-29 (×13): qty 1

## 2017-04-29 MED ORDER — SODIUM CHLORIDE 0.9 % IV SOLN
Freq: Once | INTRAVENOUS | Status: AC
  Administered 2017-04-29: 08:00:00 via INTRAVENOUS

## 2017-04-29 NOTE — Progress Notes (Signed)
Patient with severe ongoing abd pain despite toradol, BP remains 82/58 manually. No BRBPR, minimal UOP. 1) getting 1L NS bolus 2) 300cc urine in bladder, will have them I+O 3) Dilaudid ordered for pain but patient has no blood pressure so this is being held for now 4) no rigidity, no rebound, no guarding, does have diffuse tenderness but clearly a non-surgical abdomen on my physical exam done at bedside at this time.  CRITICAL CARE Performed by: Etta Quill.   Total critical care time: 30 minutes  Critical care time was exclusive of separately billable procedures and treating other patients.  Critical care was necessary to treat or prevent imminent or life-threatening deterioration.  Critical care was time spent personally by me on the following activities: development of treatment plan with patient and/or surrogate as well as nursing, discussions with consultants, evaluation of patient's response to treatment, examination of patient, obtaining history from patient or surrogate, ordering and performing treatments and interventions, ordering and review of laboratory studies, ordering and review of radiographic studies, pulse oximetry and re-evaluation of patient's condition.
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
## 2017-04-29 NOTE — Progress Notes (Signed)
Patient admitted after midnight.  Please see H&P.  GI consult for colitis.  IVF and blood transfusion-- no active bleeding found, suspect volume dilution.  Recent treatment for RMSF with doxy.  Eulogio Bear DO

## 2017-04-29 NOTE — Progress Notes (Signed)
CRITICAL VALUE ALERT  Critical value received:  Lactic Acid 2.1  Date of notification:  04/29/2017  Time of notification:  6:15  Critical value read back:Yes.    Nurse who received alert:  Anouk Critzer   MD notified (1st page):  NP Schorr  Awaiting on possible orders.   Dorrance Sellick, RN

## 2017-04-29 NOTE — Progress Notes (Signed)
Bladder scan showed 397, patient is not voiding. Did I and O cath with second RN, peri care done prior, output 300 ml.   Will continue to monitor.  Ekansh Sherk, RN

## 2017-04-29 NOTE — Progress Notes (Signed)
Initial Nutrition Assessment  DOCUMENTATION CODES:   Non-severe (moderate) malnutrition in context of chronic illness  INTERVENTION:   -Boost Breeze po TID, each supplement provides 250 kcal and 9 grams of protein  NUTRITION DIAGNOSIS:   Malnutrition (Moderate) related to chronic illness (chronic diarrhea, NASH, IBS) as evidenced by percent weight loss, mild depletion of body fat, mild depletion of muscle mass.  GOAL:   Patient will meet greater than or equal to 90% of their needs   MONITOR:   PO intake, Supplement acceptance, Labs, Weight trends  REASON FOR ASSESSMENT:   Malnutrition Screening Tool    ASSESSMENT:   46 yo female with complaints of severe abdominal pain, N/V/D admitted with diffuse colitis . Pt with hx of chronic cholecystitis s/p lap cholecystectomy 12/2016, gastritis, C.diff colitis, NASH, IBS  C.diff negative, Hgb dropped requiring PRBC transfusion  Pt reports no N/V today after eating some CL tray. Pt reports abdominal pain improved but diarrhea continues. Pt reports she cannot quantify how many times she has diarrhea she just reports it is constant all day long. Pt reports her stomach is very sensitive and she cannot tolerate many foods. Some days pt can eat ok  But other days she cannot tolerate much of anything. Noted pt with hx of chronic diarrhea. Pt reports she likes the Boost Breeze supplements but has not been able to find anywhere so she has not been drinking at home.   Pt reports she avoids lactose and well-seasoned foods. Pt also reports she cannot tolerate regular Boost/Ensure; pt prefers the CL supplements  Per weight encounters, 19.5% wt loss since March of this year (significant for time frame). Pt reports she weighed 177 pounds right before her cholecystectomy in March 2018 27.8% wt loss.  Nutrition-Focused physical exam completed. Findings are mild fat depletion, mild muscle depletion, and no edema.   Labs: potassium 3.2, Hgb 6.9 Meds: NS  at 150 ml/hr, KCl, slow mag, florastor  Diet Order:  Diet clear liquid Room service appropriate? Yes; Fluid consistency: Thin  Skin:  Reviewed, no issues  Last BM:  7/11 diarrhea  Height:   Ht Readings from Last 1 Encounters:  04/28/17 5\' 1"  (1.549 m)    Weight:   Wt Readings from Last 1 Encounters:  04/29/17 128 lb 14.4 oz (58.5 kg)    BMI:  Body mass index is 24.36 kg/m.  Estimated Nutritional Needs:   Kcal:  1650-1850 kcals  Protein:  90-90 g  Fluid:  >/= 2 L  EDUCATION NEEDS:   No education needs identified at this time  [place], [place], LDN [phone] Pager  [phone] Weekend/On-Call Pager

## 2017-04-29 NOTE — Progress Notes (Signed)
Paged NP on call Schorr and informed her that BP is still 85/55 manually checked after 1,000 NaCl given.  Patient is asymptomatic. MD Alcario Drought was checking on the patient and he was informed about BP as well.  No plan on moving patient to another floor at this time.  Patient reports decrease in pain 7/10.  Patient is finally resting in the bed. Type and screen done, awaiting on blood bank for blood.   No new orders at this time. Will continue to monitor.  Sandara Tyree, RN

## 2017-04-29 NOTE — Progress Notes (Addendum)
Hemoglobin drop noted, though possibly just dilutional with patient having received over 3L in ED since first draw: 1) PRBC transfusion ordered already 2) was going to order hemoccult, but stool is pasty yellow / white and is quite clearly not a major GIB at this point. 3) Using SCD instead of Lovenox due to mild thrombocytopenia  C.Diff quickscan also noted to be negative: 1) stopping PO vanc 2) continue IV flagyl 3) start IV rocephin 4) GI pathogen panel
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
## 2017-04-29 NOTE — Progress Notes (Signed)
CRITICAL VALUE ALERT  Critical value received:  Hemoglobin 6.9   Date of notification:  04/29/2017  Time of notification:  3:00 am  Critical value read back:Yes.    Nurse who received alert:  Ajanae Virag   MD notified (1st page):  NP. Schorr  Awaiting on possible orders.  Rainee Sweatt, RN

## 2017-04-29 NOTE — Progress Notes (Signed)
IV bolus and RBC ordered. Will continue to monitor.  Angeliz Settlemyre, RN

## 2017-04-29 NOTE — Progress Notes (Signed)
After bolus of 500 ml was given BP is still dropping. Paged NP on call and informed her. New order for 1000 NaCl bolus ordered. MD Alcario Drought at bedside as well.  Will continue to monitor.  Wael Maestas, RN

## 2017-04-29 NOTE — Progress Notes (Signed)
Patient continues getting NaCl bolus. Per NP Schorr and MD Alcario Drought patient stable for telemetry floor. Patient alert and oriented x 4.  Will continue to monitor.  Jamie Rhue, RN

## 2017-04-29 NOTE — Progress Notes (Signed)
Rechecked BP since patient was asking for pain medication. BP 77/59. Awaiting on possible orders. NP on call notified. Will continue to monitor.  Tierra Thoma, RN
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
## 2017-04-29 NOTE — Progress Notes (Signed)
New Admission Note:   Arrival Method: Bed/ ED Mental Orientation: A&Ox4 Telemetry:3ebox 27 Assessment: Completed Safety Measures: Safety Fall Prevention Plan has been discussed  Admission:  [facility] Orientation: Patient has been orientated to the room, unit and staff.  Family:  None at bedside  Orders to be reviewed and implemented. Will continue to monitor the patient. Call light has been placed within reach and bed alarm has been activated.   Venetia Night, RN Phone: [phone]

## 2017-04-29 NOTE — Progress Notes (Signed)
Another 500 ml NaCl  bolus ordered per NP Schorr.  Blood bank called, blood not ready yet, they will call when blood is ready. Will follow up with day shift nurse.   Will continue to monitor.  Favor Hackler, RN

## 2017-04-30 ENCOUNTER — Inpatient Hospital Stay (HOSPITAL_COMMUNITY): Payer: Self-pay

## 2017-04-30 DIAGNOSIS — F1099 Alcohol use, unspecified with unspecified alcohol-induced disorder: Secondary | ICD-10-CM

## 2017-04-30 DIAGNOSIS — K529 Noninfective gastroenteritis and colitis, unspecified: Secondary | ICD-10-CM

## 2017-04-30 LAB — TYPE AND SCREEN
ABO/RH(D): O NEG
Antibody Screen: POSITIVE
DAT, IgG: NEGATIVE
Donor AG Type: NEGATIVE
PT AG Type: NEGATIVE
Unit division: 0

## 2017-04-30 LAB — BPAM RBC
Blood Product Expiration Date: 201807282359
ISSUE DATE / TIME: 201807111019
Unit Type and Rh: 9500

## 2017-04-30 LAB — CBC
HEMATOCRIT: 24.2 % — AB (ref 36.0–46.0)
Hemoglobin: 8 g/dL — ABNORMAL LOW (ref 12.0–15.0)
MCH: 33.9 pg (ref 26.0–34.0)
MCHC: 33.1 g/dL (ref 30.0–36.0)
MCV: 102.5 fL — AB (ref 78.0–100.0)
PLATELETS: 99 10*3/uL — AB (ref 150–400)
RBC: 2.36 MIL/uL — AB (ref 3.87–5.11)
RDW: 25.2 % — ABNORMAL HIGH (ref 11.5–15.5)
WBC: 3.9 10*3/uL — AB (ref 4.0–10.5)

## 2017-04-30 LAB — COMPREHENSIVE METABOLIC PANEL
ALT: 18 U/L (ref 14–54)
AST: 59 U/L — AB (ref 15–41)
Albumin: 2.9 g/dL — ABNORMAL LOW (ref 3.5–5.0)
Alkaline Phosphatase: 158 U/L — ABNORMAL HIGH (ref 38–126)
Anion gap: 7 (ref 5–15)
BILIRUBIN TOTAL: 1.6 mg/dL — AB (ref 0.3–1.2)
CHLORIDE: 112 mmol/L — AB (ref 101–111)
CO2: 17 mmol/L — ABNORMAL LOW (ref 22–32)
CREATININE: 0.97 mg/dL (ref 0.44–1.00)
Calcium: 7.1 mg/dL — ABNORMAL LOW (ref 8.9–10.3)
GFR calc Af Amer: >60 mL/min (ref >60)
GLUCOSE: 75 mg/dL (ref 65–99)
Potassium: 4.3 mmol/L (ref 3.5–5.1)
Sodium: 136 mmol/L (ref 135–145)
Total Protein: 5.9 g/dL — ABNORMAL LOW (ref 6.5–8.1)

## 2017-04-30 MED ORDER — FAMOTIDINE 20 MG PO TABS
20.0000 mg | ORAL_TABLET | Freq: Two times a day (BID) | ORAL | Status: DC
  Administered 2017-04-30 – 2017-05-05 (×11): 20 mg via ORAL
  Filled 2017-04-30 (×11): qty 1

## 2017-04-30 MED ORDER — METOCLOPRAMIDE HCL 5 MG/ML IJ SOLN
10.0000 mg | Freq: Once | INTRAMUSCULAR | Status: AC
  Administered 2017-05-01: 10 mg via INTRAVENOUS
  Filled 2017-04-30: qty 2

## 2017-04-30 MED ORDER — PROMETHAZINE HCL 25 MG/ML IJ SOLN: 12.5000 mg | Freq: Four times a day (QID) | INTRAMUSCULAR | Status: DC | PRN

## 2017-04-30 MED ORDER — METOCLOPRAMIDE HCL 5 MG/ML IJ SOLN
10.0000 mg | Freq: Once | INTRAMUSCULAR | Status: AC
  Administered 2017-04-30: 10 mg via INTRAVENOUS
  Filled 2017-04-30: qty 2

## 2017-04-30 MED ORDER — DEXTROSE 5 % IV SOLN
2.0000 g | Freq: Every day | INTRAVENOUS | Status: DC
  Administered 2017-04-30 – 2017-05-02 (×3): 2 g via INTRAVENOUS
  Filled 2017-04-30 (×4): qty 2

## 2017-04-30 MED ORDER — BISACODYL 5 MG PO TBEC
5.0000 mg | DELAYED_RELEASE_TABLET | Freq: Three times a day (TID) | ORAL | Status: DC
Start: 2017-04-30 — End: 2017-04-30

## 2017-04-30 MED ORDER — POLYETHYLENE GLYCOL 3350 17 GM/SCOOP PO POWD
1.0000 | Freq: Once | ORAL | Status: AC
  Administered 2017-04-30: 255 g via ORAL
  Filled 2017-04-30: qty 255

## 2017-04-30 NOTE — Addendum Note (Signed)
Addendum  created 04/30/17 1633 by Lynda Rainwater, MD   Sign clinical note

## 2017-04-30 NOTE — Progress Notes (Signed)
Daily Rounding Note  04/30/2017, 10:22 AM  LOS: 1 day   SUBJECTIVE:   Chief complaint:  Abdominal pain mid abdomen, right >> left.   Dilaudid and Toradol prn added.  Feels bloated.   Stools less loose, soft but not formed.  No visible blood Vomited clears this AM.   Still wants to go through with colonoscopy set for 10 AM tomorrow.   OBJECTIVE:         Vital signs in last 24 hours:    Temp:  [98.1 F (36.7 C)-98.4 F (36.9 C)] 98.4 F (36.9 C) (07/12 0559) Pulse Rate:  [80-89] 80 (07/12 0559) Resp:  [16-20] 17 (07/12 0559) BP: (82-114)/(44-60) 92/60 (07/12 0559) SpO2:  [95 %-99 %] 95 % (07/12 0559) Weight:  [61.3 kg (135 lb 3.2 oz)] 61.3 kg (135 lb 3.2 oz) (07/12 0559) Last BM Date: 04/30/17 Filed Weights   04/28/17 1513 04/29/17 0016 04/30/17 0559  Weight: 59 kg (130 lb) 58.5 kg (128 lb 14.4 oz) 61.3 kg (135 lb 3.2 oz)   General: NAD.  Chronically unwell looking   Heart: RRR Chest: clear bil.  No cough or dyspnea Abdomen: soft, ND.  Mild bil mid abd pain.    Extremities: no CCE Neuro/Psych:  No tremor, no somnolence.  Fully awake and alert.  No gross weakness or tremor.     Intake/Output from previous day: 07/11 0701 - 07/12 0700 In: 796.7 [P.O.:320; Blood:376.7; IV Piggyback:100] Out: 600 [Urine:350; Stool:250]  Intake/Output this shift: Total I/O In: 60 [P.O.:60] Out: -   Lab Results:  Recent Labs  04/28/17 1518 04/29/17 0230 04/29/17 0520 04/29/17 1402 04/30/17 0425  WBC 6.6 4.6  --   --  3.9*  HGB 8.4* 6.9*  --  7.8* 8.0*  HCT 24.8* 20.3*  --  23.3* 24.2*  PLT 120* 89* 94*  --  99*   BMET  Recent Labs  04/28/17 1518 04/29/17 0230 04/30/17 0425  NA 137 138 136  K 3.4* 3.2* 4.3  CL 103 108 112*  CO2 21* 21* 17*  GLUCOSE 99 82 75  BUN <5* <5* <5*  CREATININE 0.86 0.88 0.97  CALCIUM 8.6* 7.4* 7.1*   LFT  Recent Labs  04/28/17 1518 04/30/17 0425  PROT 7.3 5.9*  ALBUMIN 3.2*  2.9*  AST 75* 59*  ALT 22 18  ALKPHOS 174* 158*  BILITOT 2.5* 1.6*   PT/INR  Recent Labs  04/29/17 0520  LABPROT 21.2*  INR 1.81   Hepatitis Panel No results for input(s): HEPBSAG, HCVAB, HEPAIGM, HEPBIGM in the last 72 hours.  Studies/Results: Ct Abdomen Pelvis Wo Contrast  Result Date: 04/28/2017 CLINICAL DATA:  Vomiting and right lower quadrant pain. Cholecystectomy. IV contrast allergy. Nonalcoholic steatohepatitis. History of colitis in 2015. EXAM: CT ABDOMEN AND PELVIS WITHOUT CONTRAST TECHNIQUE: Multidetector CT imaging of the abdomen and pelvis was performed following the standard protocol without IV contrast. COMPARISON:  01/11/2017 FINDINGS: Lower chest: Bibasilar subsegmental atelectasis. Normal heart size without pericardial or pleural effusion. Contrast in the lower esophagus. Hepatobiliary: Moderate hepatic steatosis and moderate cirrhosis. Hepatomegaly at 19.2 cm Cholecystectomy. Resolved fluid collection in the operative bed. No biliary duct dilatation. Pancreas: Equivocal peripancreatic edema, including image 32/series 3. No fluid collection or pancreatic duct dilatation. Spleen: Splenomegaly at 14.5 cm craniocaudal. Adrenals/Urinary Tract: Normal adrenal glands. Moderate left renal cortical thinning. No renal calculi or hydronephrosis. No hydroureter or ureteric calculi. No bladder calculi. Stomach/Bowel: Normal stomach, without wall thickening. Moderate  colonic wall thickening from approximately the descending/sigmoid junction proximally. Relatively featureless colon. Pericolonic edema is most apparent surrounding the ascending segment. Normal terminal ileum. Normal small bowel. Vascular/Lymphatic: Aortic and branch vessel atherosclerosis. No abdominopelvic adenopathy. Reproductive: Normal uterus and adnexa. Other: No significant free fluid. Musculoskeletal: No acute osseous abnormality. IMPRESSION: 1. Extensive colitis. Favor infection. Exclude Celsius difficile clinically.  Relative featureless colon can be seen with ulcerative colitis but could be the sequelae of repeated episodes of infectious colitis. 2. Cirrhosis, steatosis, and hepatomegaly. 3. Left renal scarring/atrophy. 4. Cannot exclude mild peripancreatic edema. Consider correlation with pancreatic enzymes. 5. Esophageal air fluid level suggests dysmotility or gastroesophageal reflux. 6.  Aortic Atherosclerosis (ICD10-I70.0). Electronically Signed   By: Abigail Miyamoto M.D.   On: 04/28/2017 20:01   Scheduled Meds: . bisacodyl  5 mg Oral Q8H  . feeding supplement  1 Container Oral TID BM  . gabapentin  600 mg Oral QHS  . levothyroxine  75 mcg Oral QAC breakfast  . magnesium chloride  1 tablet Oral BID  . nicotine  14 mg Transdermal Daily  . potassium chloride SA  40 mEq Oral BID  . pregabalin  50 mg Oral TID  . saccharomyces boulardii  250 mg Oral QODAY   Continuous Infusions: . sodium chloride 75 mL/hr at 04/30/17 0830   PRN Meds:.acetaminophen **OR** acetaminophen, diazepam, HYDROmorphone (DILAUDID) injection, ketorolac, ondansetron **OR** ondansetron (ZOFRAN) IV, oxyCODONE-acetaminophen, promethazine  ASSESMENT:   *  Acute on chronic abdominal pain with N/V.  Acute colitis per CT. Given hypotension, rule out ischemic colitis. Although CT reading suggested infectious etiology, both C. difficile and stool pathogen panel are negative.  Hx C diff in 2015.    *  Hepatic steatosis and probable cirrhosis.  Multiple non-revealing lab tests. Suspect ETOH/fatty liver as cause. Has not had liver biopsy or elastography.   Planned intra-op liver biopsy 12/2016 at the time of cholecystectomy was not pursued due to complications of operative bleeding.  *  Acute on chronic anemia. Macrocytic. Vigorous response to PRBC x 1.  No overt bleeding. Macrocytosis and periodic anemia date back several years but hemoglobins trending worse over several months and acutely worse this admission.  *  Coagulopathy.   Thrombocytopenia (non-critical)/splenomegaly. Likely sequela of her liver disease.  *  ETOH abuse, not in remission.    *  [facility] spotted fever dx 5/23, completed 2 plus weeks of doxycycline.    PLAN   *  Colonoscopy at 10 AM tomorrow.  Stopped Rocephin and Flagyl.  Prep with   *  Adding Pepcid (no PPI given hx C diff) for n/v and hx gastritis    Jamie Allen  04/30/2017, 10:22 AM Pager: [phone]  Addendum: I have taken an interval history, reviewed the chart, and examined the patient. I agree with the Advanced Practitioner's note and impression. Plan colonoscopy tomorrow to evaluate colitis seen by CT and recent diarrhea

## 2017-04-30 NOTE — Progress Notes (Signed)
Pt educated about safety and importance of bed alarm during the night however pt refuses to be on bed alarm. Will continue to round on patient.   Disha Cottam, RN    

## 2017-04-30 NOTE — Progress Notes (Signed)
Pt c/o feeling full and bloated.  Pt not able to void.  Bladder scan showed 236.  MD notified, will continue to monitor.

## 2017-04-30 NOTE — Anesthesia Postprocedure Evaluation (Signed)
Anesthesia Post Note  Patient: Jamie Allen  Procedure(s) Performed: Procedure(s) (LRB): LAPAROSCOPIC CHOLECYSTECTOMY WITH INTRAOPERATIVE CHOLANGIOGRAM (N/A)     Anesthesia Post Evaluation  Last Vitals:  Vitals:   12/29/16 2125 12/30/16 0541  BP: (!) 118/53 130/78  Pulse: (!) 102 (!) 103  Resp: 20 20  Temp: 36.8 C 36.4 C    Last Pain:  Vitals:   12/30/16 1146  TempSrc:   PainSc: 3                  Lynda Rainwater

## 2017-04-30 NOTE — Progress Notes (Signed)
PROGRESS NOTE    Jamie Allen  CBU:[id number] DOB: 15-Sep-1971 DOA: 04/28/2017 PCP: Boykin Nearing, MD   Outpatient Specialists:     Brief Narrative:  Jamie Allen is a 46 y.o. female with medical history significant of C.Diff diarrhea, chronic cholecystitis s/p lap chole 3/8.  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  Finished course of doxycycline.  Patient developed severe generalized abd pain, nausea, vomiting, diarrhea.  Onset yesterday, worsening.  Nothing makes better or worse.  Diarrhea smells like C.Diff she says  CT scan showed colitis.  C diff negative.  For colonoscopy in AM.    Assessment & Plan:   Principal Problem:   Colitis presumed infectious Active Problems:   Abdominal pain   Hypotension   Abnormal CT scan, colon    Colitis  Empiric flagyl and PO vanc  S/p IV  C.Diff negative  -IV abx per GI  For colonoscopy in AM  UTI  Culture with GNR >100,000  Rocephin  Await final  Having to I/O cath due to urinary retention  Anemia  s/p 1 unit PRBC  no labs ordered before blood given re Fe. Etc  Abd pain, N/V/D  Tylenol PRN  Zofran PRN IV  For colonoscopy in AM  Recent diagnosis of RMSF  Treated with doxy  NASH  INR elevated  plts low  Monitor  Supposed to have biopsy as outpatient  Alcohol use  no signs of withdrawal  DVT prophylaxis:  Lovenox   Code Status: Full Code   Family Communication:   Disposition Plan:     Consultants:   GI  Procedures:   colonoscopy 7/13     Subjective: Asking for a shower today Had a stool accident this AM  Objective: Vitals:   04/29/17 2115 04/29/17 2235 04/30/17 0559 04/30/17 1216  BP: (!) 83/44 (!) 114/50 92/60 (!) 105/57  Pulse: 86 89 80 84  Resp: 16  17 18   Temp: 98.1 F (36.7 C)  98.4 F (36.9 C) 98 F (36.7 C)  TempSrc: Oral  Oral Oral  SpO2: 95% 99% 95% 100%  Weight:   61.3 kg (135 lb 3.2 oz)   Height:        Intake/Output Summary  (Last 24 hours) at 04/30/17 1336 Last data filed at 04/30/17 0935  Gross per 24 hour  Intake              360 ml  Output              350 ml  Net               10 ml   Filed Weights   04/28/17 1513 04/29/17 0016 04/30/17 0559  Weight: 59 kg (130 lb) 58.5 kg (128 lb 14.4 oz) 61.3 kg (135 lb 3.2 oz)    Examination:  General exam: chronically ill appearing, NAD- sitting up in bed Respiratory system: Clear to auscultation. Respiratory effort normal. Cardiovascular system: S1 & S2 heard, RRR. No JVD, murmurs, rubs, gallops or clicks. No pedal edema. Gastrointestinal system: +BS, soft, mildly distended Central nervous system: Alert and oriented. No focal neurological deficits. Extremities: Symmetric 5 x 5 power.    Data Reviewed: I have personally reviewed following labs and imaging studies  CBC:  Recent Labs Lab 04/28/17 1518 04/29/17 0230 04/29/17 0520 04/29/17 1402 04/30/17 0425  WBC 6.6 4.6  --   --  3.9*  HGB 8.4* 6.9*  --  7.8* 8.0*  HCT 24.8* 20.3*  --  23.3* 24.2*  MCV 107.4* 109.1*  --   --  102.5*  PLT 120* 89* 94*  --  99*   Basic Metabolic Panel:  Recent Labs Lab 04/28/17 1518 04/29/17 0230 04/30/17 0425  NA 137 138 136  K 3.4* 3.2* 4.3  CL 103 108 112*  CO2 21* 21* 17*  GLUCOSE 99 82 75  BUN <5* <5* <5*  CREATININE 0.86 0.88 0.97  CALCIUM 8.6* 7.4* 7.1*   GFR: Estimated Creatinine Clearance: 60.9 mL/min (by C-G formula based on SCr of 0.97 mg/dL). Liver Function Tests:  Recent Labs Lab 04/28/17 1518 04/30/17 0425  AST 75* 59*  ALT 22 18  ALKPHOS 174* 158*  BILITOT 2.5* 1.6*  PROT 7.3 5.9*  ALBUMIN 3.2* 2.9*    Recent Labs Lab 04/28/17 1518  LIPASE 23   No results for input(s): AMMONIA in the last 168 hours. Coagulation Profile:  Recent Labs Lab 04/29/17 0520  INR 1.81   Cardiac Enzymes: No results for input(s): CKTOTAL, CKMB, CKMBINDEX, TROPONINI in the last 168 hours. BNP (last 3 results) No results for input(s): PROBNP  in the last 8760 hours. HbA1C: No results for input(s): HGBA1C in the last 72 hours. CBG: No results for input(s): GLUCAP in the last 168 hours. Lipid Profile: No results for input(s): CHOL, HDL, LDLCALC, TRIG, CHOLHDL, LDLDIRECT in the last 72 hours. Thyroid Function Tests: No results for input(s): TSH, T4TOTAL, FREET4, T3FREE, THYROIDAB in the last 72 hours. Anemia Panel: No results for input(s): VITAMINB12, FOLATE, FERRITIN, TIBC, IRON, RETICCTPCT in the last 72 hours. Urine analysis:    Component Value Date/Time   COLORURINE AMBER (A) 04/29/2017 0519   APPEARANCEUR CLOUDY (A) 04/29/2017 0519   LABSPEC 1.008 04/29/2017 0519   PHURINE 6.0 04/29/2017 0519   GLUCOSEU NEGATIVE 04/29/2017 0519   HGBUR SMALL (A) 04/29/2017 0519   BILIRUBINUR NEGATIVE 04/29/2017 0519   BILIRUBINUR negative 05/19/2016 1638   KETONESUR NEGATIVE 04/29/2017 0519   PROTEINUR 30 (A) 04/29/2017 0519   UROBILINOGEN 0.2 05/19/2016 1638   UROBILINOGEN 0.2 08/17/2015 1853   NITRITE NEGATIVE 04/29/2017 0519   LEUKOCYTESUR LARGE (A) 04/29/2017 0519     Recent Results (from the past 240 hour(s))  C difficile quick scan w PCR reflex     Status: None   Collection Time: 04/28/17 11:15 PM  Result Value Ref Range Status   C Diff antigen NEGATIVE NEGATIVE Final   C Diff toxin NEGATIVE NEGATIVE Final   C Diff interpretation No C. difficile detected.  Final  Culture, Urine     Status: Abnormal (Preliminary result)   Collection Time: 04/29/17  5:19 AM  Result Value Ref Range Status   Specimen Description URINE, RANDOM  Final   Special Requests NONE  Final   Culture >=100,000 COLONIES/mL GRAM NEGATIVE RODS (A)  Final   Report Status PENDING  Incomplete  Gastrointestinal Panel by PCR , Stool     Status: None   Collection Time: 04/29/17  5:20 AM  Result Value Ref Range Status   Campylobacter species NOT DETECTED NOT DETECTED Final   Plesimonas shigelloides NOT DETECTED NOT DETECTED Final   Salmonella species NOT  DETECTED NOT DETECTED Final   Yersinia enterocolitica NOT DETECTED NOT DETECTED Final   Vibrio species NOT DETECTED NOT DETECTED Final   Vibrio cholerae NOT DETECTED NOT DETECTED Final   Enteroaggregative E coli (EAEC) NOT DETECTED NOT DETECTED Final   Enteropathogenic E coli (EPEC) NOT DETECTED NOT DETECTED Final   Enterotoxigenic E coli (ETEC) NOT DETECTED  NOT DETECTED Final   Shiga like toxin producing E coli (STEC) NOT DETECTED NOT DETECTED Final   Shigella/Enteroinvasive E coli (EIEC) NOT DETECTED NOT DETECTED Final   Cryptosporidium NOT DETECTED NOT DETECTED Final   Cyclospora cayetanensis NOT DETECTED NOT DETECTED Final   Entamoeba histolytica NOT DETECTED NOT DETECTED Final   Giardia lamblia NOT DETECTED NOT DETECTED Final   Adenovirus F40/41 NOT DETECTED NOT DETECTED Final   Astrovirus NOT DETECTED NOT DETECTED Final   Norovirus GI/GII NOT DETECTED NOT DETECTED Final   Rotavirus A NOT DETECTED NOT DETECTED Final   Sapovirus (I, II, IV, and V) NOT DETECTED NOT DETECTED Final      Anti-infectives    Start     Dose/Rate Route Frequency Ordered Stop   04/29/17 0500  cefTRIAXone (ROCEPHIN) 2 g in dextrose 5 % 50 mL IVPB  Status:  Discontinued     2 g 100 mL/hr over 30 Minutes Intravenous Daily 04/29/17 0418 04/30/17 1026   04/29/17 0030  vancomycin (VANCOCIN) 50 mg/mL oral solution 500 mg  Status:  Discontinued     500 mg Oral Every 6 hours 04/28/17 2114 04/29/17 0418   04/28/17 2115  metroNIDAZOLE (FLAGYL) IVPB 500 mg  Status:  Discontinued     500 mg 100 mL/hr over 60 Minutes Intravenous Every 8 hours 04/28/17 2114 04/30/17 1026       Radiology Studies: Ct Abdomen Pelvis Wo Contrast  Result Date: 04/28/2017 CLINICAL DATA:  Vomiting and right lower quadrant pain. Cholecystectomy. IV contrast allergy. Nonalcoholic steatohepatitis. History of colitis in 2015. EXAM: CT ABDOMEN AND PELVIS WITHOUT CONTRAST TECHNIQUE: Multidetector CT imaging of the abdomen and pelvis was  performed following the standard protocol without IV contrast. COMPARISON:  01/11/2017 FINDINGS: Lower chest: Bibasilar subsegmental atelectasis. Normal heart size without pericardial or pleural effusion. Contrast in the lower esophagus. Hepatobiliary: Moderate hepatic steatosis and moderate cirrhosis. Hepatomegaly at 19.2 cm Cholecystectomy. Resolved fluid collection in the operative bed. No biliary duct dilatation. Pancreas: Equivocal peripancreatic edema, including image 32/series 3. No fluid collection or pancreatic duct dilatation. Spleen: Splenomegaly at 14.5 cm craniocaudal. Adrenals/Urinary Tract: Normal adrenal glands. Moderate left renal cortical thinning. No renal calculi or hydronephrosis. No hydroureter or ureteric calculi. No bladder calculi. Stomach/Bowel: Normal stomach, without wall thickening. Moderate colonic wall thickening from approximately the descending/sigmoid junction proximally. Relatively featureless colon. Pericolonic edema is most apparent surrounding the ascending segment. Normal terminal ileum. Normal small bowel. Vascular/Lymphatic: Aortic and branch vessel atherosclerosis. No abdominopelvic adenopathy. Reproductive: Normal uterus and adnexa. Other: No significant free fluid. Musculoskeletal: No acute osseous abnormality. IMPRESSION: 1. Extensive colitis. Favor infection. Exclude Celsius difficile clinically. Relative featureless colon can be seen with ulcerative colitis but could be the sequelae of repeated episodes of infectious colitis. 2. Cirrhosis, steatosis, and hepatomegaly. 3. Left renal scarring/atrophy. 4. Cannot exclude mild peripancreatic edema. Consider correlation with pancreatic enzymes. 5. Esophageal air fluid level suggests dysmotility or gastroesophageal reflux. 6.  Aortic Atherosclerosis (ICD10-I70.0). Electronically Signed   By: Abigail Miyamoto M.D.   On: 04/28/2017 20:01        Scheduled Meds: . famotidine  20 mg Oral BID  . feeding supplement  1 Container  Oral TID BM  . gabapentin  600 mg Oral QHS  . levothyroxine  75 mcg Oral QAC breakfast  . magnesium chloride  1 tablet Oral BID  . metoCLOPramide (REGLAN) injection  10 mg Intravenous Once   Followed by  . [START ON 05/01/2017] metoCLOPramide (REGLAN) injection  10 mg Intravenous Once  .  nicotine  14 mg Transdermal Daily  . polyethylene glycol powder  1 Container Oral Once  . potassium chloride SA  40 mEq Oral BID  . pregabalin  50 mg Oral TID  . saccharomyces boulardii  250 mg Oral QODAY   Continuous Infusions: . sodium chloride 75 mL/hr at 04/30/17 0830     LOS: 1 day    Time spent: 23 min    [place], DO [facility] Pager [phone]  If 7PM-7AM, please contact night-coverage [email or web address] Password [facility] 04/30/2017, 1:36 PM

## 2017-04-30 NOTE — Progress Notes (Signed)
Pt not able to tolerate prep.  MD paged.

## 2017-04-30 NOTE — Progress Notes (Signed)
Pt has vomited four times since she started drinking prep for tomorrow.  PA paged.

## 2017-05-01 ENCOUNTER — Inpatient Hospital Stay (HOSPITAL_COMMUNITY): Payer: Self-pay | Admitting: Anesthesiology

## 2017-05-01 ENCOUNTER — Encounter (HOSPITAL_COMMUNITY)
Admission: EM | Disposition: A | Discharge status: Home / Self Care | Payer: Self-pay | Source: Home / Self Care | Attending: Internal Medicine

## 2017-05-01 ENCOUNTER — Encounter (HOSPITAL_COMMUNITY): Payer: Self-pay | Admitting: *Deleted

## 2017-05-01 DIAGNOSIS — D696 Thrombocytopenia, unspecified: Secondary | ICD-10-CM

## 2017-05-01 DIAGNOSIS — R079 Chest pain, unspecified: Secondary | ICD-10-CM

## 2017-05-01 DIAGNOSIS — R109 Unspecified abdominal pain: Secondary | ICD-10-CM

## 2017-05-01 DIAGNOSIS — D649 Anemia, unspecified: Secondary | ICD-10-CM

## 2017-05-01 DIAGNOSIS — K7581 Nonalcoholic steatohepatitis (NASH): Secondary | ICD-10-CM

## 2017-05-01 DIAGNOSIS — E860 Dehydration: Secondary | ICD-10-CM

## 2017-05-01 HISTORY — PX: COLONOSCOPY: SHX5424

## 2017-05-01 LAB — URINE CULTURE: Culture: >=100000 — AB

## 2017-05-01 SURGERY — COLONOSCOPY
Anesthesia: Monitor Anesthesia Care

## 2017-05-01 MED ORDER — LIDOCAINE HCL (CARDIAC) 20 MG/ML IV SOLN
INTRAVENOUS | Status: DC | PRN
  Administered 2017-05-01: 60 mg via INTRAVENOUS

## 2017-05-01 MED ORDER — PROPOFOL 10 MG/ML IV BOLUS
INTRAVENOUS | Status: DC | PRN
  Administered 2017-05-01: 20 mg via INTRAVENOUS
  Administered 2017-05-01: 60 mg via INTRAVENOUS
  Administered 2017-05-01: 50 mg via INTRAVENOUS
  Administered 2017-05-01: 80 mg via INTRAVENOUS

## 2017-05-01 MED ORDER — MIDAZOLAM HCL 5 MG/5ML IJ SOLN
INTRAMUSCULAR | Status: DC | PRN
  Administered 2017-05-01: 2 mg via INTRAVENOUS

## 2017-05-01 MED ORDER — DIPHENOXYLATE-ATROPINE 2.5-0.025 MG PO TABS
1.0000 | ORAL_TABLET | Freq: Four times a day (QID) | ORAL | Status: DC | PRN
  Administered 2017-05-04: 1 via ORAL
  Filled 2017-05-01: qty 1

## 2017-05-01 NOTE — Interval H&P Note (Signed)
History and Physical Interval Note: For colonoscopy today to eval colitis, diarrhea, abd pain Infectious eval neg The nature of the procedure, as well as the risks, benefits, and alternatives were carefully and thoroughly reviewed with the patient. Ample time for discussion and questions allowed. The patient understood, was satisfied, and agreed to proceed.   Lab Results  Component Value Date   INR 1.81 04/29/2017   INR 1.6 (H) 01/28/2017   INR 1.51 01/02/2017   CBC    Component Value Date/Time   WBC 3.9 (L) 04/30/2017 0425   RBC 2.36 (L) 04/30/2017 0425   HGB 8.0 (L) 04/30/2017 0425   HGB 9.2 (L) 04/06/2017 1445   HCT 24.2 (L) 04/30/2017 0425   HCT 26.9 (L) 04/06/2017 1445   PLT 99 (L) 04/30/2017 0425   PLT 149 (L) 04/06/2017 1445   MCV 102.5 (H) 04/30/2017 0425   MCV 105 (H) 04/06/2017 1445   MCH 33.9 04/30/2017 0425   MCHC 33.1 04/30/2017 0425   RDW 25.2 (H) 04/30/2017 0425   RDW 18.0 (H) 04/06/2017 1445   LYMPHSABS 1.7 04/06/2017 1445   MONOABS 0.3 01/28/2017 1005   EOSABS 0.2 04/06/2017 1445   BASOSABS 0.0 04/06/2017 1445   CMP     Component Value Date/Time   NA 136 04/30/2017 0425   NA 141 04/06/2017 1445   K 4.3 04/30/2017 0425   CL 112 (H) 04/30/2017 0425   CO2 17 (L) 04/30/2017 0425   GLUCOSE 75 04/30/2017 0425   BUN <5 (L) 04/30/2017 0425   BUN 4 (L) 04/06/2017 1445   CREATININE 0.97 04/30/2017 0425   CREATININE 1.01 05/19/2016 1639   CALCIUM 7.1 (L) 04/30/2017 0425   PROT 5.9 (L) 04/30/2017 0425   PROT 7.2 03/11/2017 1540   ALBUMIN 2.9 (L) 04/30/2017 0425   ALBUMIN 3.6 03/11/2017 1540   AST 59 (H) 04/30/2017 0425   ALT 18 04/30/2017 0425   ALKPHOS 158 (H) 04/30/2017 0425   BILITOT 1.6 (H) 04/30/2017 0425   BILITOT 1.8 (H) 03/11/2017 1540   GFRNONAA >60 04/30/2017 0425   GFRNONAA 67 05/19/2016 1639   GFRAA >60 04/30/2017 0425   GFRAA 78 05/19/2016 1639       05/01/2017 11:16 AM  Toshua Bryson Dames  has presented today for surgery, with the  diagnosis of Colitis  The various methods of treatment have been discussed with the patient and family. After consideration of risks, benefits and other options for treatment, the patient has consented to  Procedure(s): COLONOSCOPY (N/A) as a surgical intervention .  The patient's history has been reviewed, patient examined, no change in status, stable for surgery.  I have reviewed the patient's chart and labs.  Questions were answered to the patient's satisfaction.     PYRTLE, JAY M

## 2017-05-01 NOTE — Anesthesia Preprocedure Evaluation (Signed)
Anesthesia Evaluation  Patient identified by MRN, date of birth, ID band Patient awake    Reviewed: Allergy & Precautions, NPO status , Patient's Chart, lab work & pertinent test results  Airway Mallampati: II   Neck ROM: Full    Dental  (+) Missing, Poor Dentition   Pulmonary Current Smoker,    breath sounds clear to auscultation       Cardiovascular hypertension,  Rhythm:Regular Rate:Normal     Neuro/Psych  Headaches,    GI/Hepatic GERD  ,(+) Hepatitis -  Endo/Other    Renal/GU Renal disease     Musculoskeletal   Abdominal   Peds  Hematology  (+) anemia ,   Anesthesia Other Findings   Reproductive/Obstetrics                             Anesthesia Physical Anesthesia Plan  ASA: III  Anesthesia Plan: MAC   Post-op Pain Management:    Induction:   PONV Risk Score and Plan: 2 and Ondansetron and Dexamethasone  Airway Management Planned: Natural Airway and Nasal Cannula  Additional Equipment:   Intra-op Plan:   Post-operative Plan: Extubation in OR  Informed Consent: I have reviewed the patients History and Physical, chart, labs and discussed the procedure including the risks, benefits and alternatives for the proposed anesthesia with the patient or authorized representative who has indicated his/her understanding and acceptance.   Dental advisory given  Plan Discussed with: CRNA  Anesthesia Plan Comments:         Anesthesia Quick Evaluation

## 2017-05-01 NOTE — Op Note (Signed)
[facility] Patient Name: Jamie Allen Procedure Date : 05/01/2017 MRN: [id number] Attending MD: Jerene Bears , MD Date of Birth: May 06, 1971 CSN: [id number] Age: 46 Admit Type: Outpatient Procedure:                Colonoscopy Indications:              Abdominal pain, Clinically significant diarrhea of                            unexplained origin, Abnormal CT of the GI tract Providers:                Lajuan Lines. Hilarie Fredrickson, MD, Lillie Fragmin, RN, Marcene Duos, Technician Referring MD:             [facility] Medicines:                Monitored Anesthesia Care Complications:            No immediate complications. Estimated Blood Loss:     Estimated blood loss was minimal. Procedure:                Pre-Anesthesia Assessment:                           - Prior to the procedure, a History and Physical                            was performed, and patient medications and                            allergies were reviewed. The patient's tolerance of                            previous anesthesia was also reviewed. The risks                            and benefits of the procedure and the sedation                            options and risks were discussed with the patient.                            All questions were answered, and informed consent                            was obtained. Prior Anticoagulants: The patient has                            taken no previous anticoagulant or antiplatelet                            agents. ASA Grade Assessment: III - A patient with  severe systemic disease. After reviewing the risks                            and benefits, the patient was deemed in                            satisfactory condition to undergo the procedure.                           - Prior to the procedure, a History and Physical                            was performed, and patient medications and                     allergies were reviewed. The patient's tolerance of                            previous anesthesia was also reviewed. The risks                            and benefits of the procedure and the sedation                            options and risks were discussed with the patient.                            All questions were answered, and informed consent                            was obtained. Prior Anticoagulants: The patient has                            taken no previous anticoagulant or antiplatelet                            agents. ASA Grade Assessment: III - A patient with                            severe systemic disease. After reviewing the risks                            and benefits, the patient was deemed in                            satisfactory condition to undergo the procedure.                           After obtaining informed consent, the colonoscope                            was passed under direct vision. Throughout the  procedure, the patient's blood pressure, pulse, and                            oxygen saturations were monitored continuously. The                            [id number] ([id number]) scope was introduced through                            the anus and advanced to the the terminal ileum.                            The colonoscopy was performed without difficulty.                            The patient tolerated the procedure well. The                            quality of the bowel preparation was good. The                            terminal ileum, ileocecal valve, appendiceal                            orifice, and rectum were photographed. Scope In: 11:27:00 AM Scope Out: 11:40:44 AM Scope Withdrawal Time: 0 hours 9 minutes 15 seconds  Total Procedure Duration: 0 hours 13 minutes 44 seconds  Findings:      The digital rectal exam was normal.      The terminal ileum appeared normal.      Scattered areas of  nonspecific, mildly erythematous mucosa was found at       the hepatic flexure and in the ascending colon. The entire colon was       rather featureless with mild edema and mild friability (contact       erythema) without ulceration or erosion. This is not consistent with       IBD, and was biopsied with a cold forceps for histology in the right and       left colon (2 jars). Query relation to hypoalbuminemia and cirrhosis.      Internal hemorrhoids were found during retroflexion. The hemorrhoids       were medium-sized. Impression:               - The examined portion of the ileum was normal.                           - Nonspecific erythematous mucosa at the hepatic                            flexure and in the ascending colon. See above                            description, multiple biopsies from right and left  colon.                           - Internal hemorrhoids. Moderate Sedation:      N/A Recommendation:           - Return patient to hospital ward for ongoing care.                           - Resume previous diet.                           - Continue present medications. Would continue                            Florastor. Will add Lomotil QIDPRN for diarrhea.                           - Await pathology results.                           - GI will see patient again on Monday and follow-up                            on pathology results with further recommendations                            at that time.                           - Repeat colonoscopy is recommended. The                            colonoscopy date will be determined after pathology                            results from today's exam become available for                            review. Procedure Code(s):        --- Professional ---                           [phone], Colonoscopy, flexible; with biopsy, single                            or multiple Diagnosis Code(s):        ---  Professional ---                           K64.8, Other hemorrhoids                           K63.89, Other specified diseases of intestine                           R10.9, Unspecified abdominal pain  R19.7, Diarrhea, unspecified                           R93.3, Abnormal findings on diagnostic imaging of                            other parts of digestive tract CPT copyright 2016 [employer]. All rights reserved. The codes documented in this report are preliminary and upon coder review may  be revised to meet current compliance requirements. Jerene Bears, MD 05/01/2017 12:07:47 PM This report has been signed electronically. Number of Addenda: 0

## 2017-05-01 NOTE — Progress Notes (Signed)
PROGRESS NOTE    Jamie Allen  AGT:[id number] DOB: May 22, 1971 DOA: 04/28/2017 PCP: Boykin Nearing, MD   Chief Complaint  Patient presents with  . Abdominal Pain    Brief Narrative:  HPI on 04/29/2017 by Dr. Jennette Kettle [name] is a 46 y.o. female with medical history significant of C.Diff diarrhea, chronic cholecystitis s/p lap chole 3/8.  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  Finished course of doxycycline.  Patient developed severe generalized abd pain, nausea, vomiting, diarrhea.  Onset yesterday, worsening.  Nothing makes better or worse.  Diarrhea smells like C.Diff she says. Assessment & Plan   Colitis  -CT of abdomen showed extensive colitis, favor infection. Cirrhosis, steatosis, hepatomegaly -GI consulted and appreciated -CDiff negative -Was initially placed on vanc/flagyl- discontinued by Gastroenterology -Status post colonoscopy: Mild erythematous mucosa without hepatic flexure and descending colon. Enteric: With mild edema and mild friability without ulceration or erosion. Not consistent with IBD, biopsies taken.internal hemorrhoids -Gastroenterology recommended continuing floor store, adding Lomotil 4 times a day when necessary for diarrhea and awaiting pathology results.  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 Pending biopsy results  Consultants Gastroenterology  Procedures  Colonoscopy  Antibiotics   Anti-infectives    Start     Dose/Rate Route Frequency Ordered Stop   04/30/17 1500  cefTRIAXone (ROCEPHIN) 2 g in dextrose 5 % 50 mL IVPB     2 g 100 mL/hr over 30 Minutes Intravenous Daily 04/30/17 1404     04/29/17 0500  cefTRIAXone (ROCEPHIN) 2 g in dextrose 5 % 50 mL IVPB  Status:  Discontinued     2 g 100 mL/hr over 30 Minutes Intravenous Daily 04/29/17 0418 04/30/17 1026   04/29/17 0030  vancomycin (VANCOCIN) 50 mg/mL oral solution 500 mg  Status:  Discontinued     500 mg Oral Every 6 hours 04/28/17 2114 04/29/17 0418   04/28/17 2115  metroNIDAZOLE (FLAGYL) IVPB 500 mg  Status:  Discontinued     500 mg 100 mL/hr over 60 Minutes Intravenous Every 8 hours 04/28/17 2114 04/30/17 1026      Subjective:   Blessyn Allen seen and examined today.  Patient still feeling some nausea. States she was unable to drink all of the bowel prep yesterday evening secondary to nausea and abdominal bloating. She has abdominal bloating after having something acidic. Currently denies chest pain, shortness of breath, diarrhea or constipation at this time, dizziness or headache.  Objective:   Vitals:   05/01/17 1039 05/01/17 1146 05/01/17 1200 05/01/17 1232  BP: (!) 120/57 (!) 98/59 116/71 112/68  Pulse:    80  Resp: 17 (!) 25 (!) 27 18  Temp: 98.7 F (37.1 C)   98.2 F (36.8 C)  TempSrc: Oral   Oral  SpO2: 99% 99% 98% 99%  Weight: 62.6 kg (138 lb)  Height: 5' 1"  (1.549 m)       Intake/Output Summary (Last 24 hours) at 05/01/17 1508 Last data filed at 05/01/17 1304  Gross per 24 hour  Intake             1060 ml  Output              825 ml  Net              235 ml   Filed Weights   04/30/17 0559 05/01/17 0449 05/01/17 1039  Weight: 61.3 kg (135 lb 3.2 oz) 62.6 kg (138  lb 1.6 oz) 62.6 kg (138 lb)    Exam  General: Well developed, Chronically ill, no apparent distress  HEENT: NCAT, mucous membranes moist.   Cardiovascular: S1 S2 auscultated, no rubs, murmurs or gallops. Regular rate and rhythm.  Respiratory: Clear to auscultation bilaterally with equal chest rise  Abdomen: Soft, nontender, distended, + bowel sounds  Extremities: warm dry without cyanosis clubbing or edema  Neuro: AAOx3, nonfocal  Psych: Normal affect and demeanor with intact judgement and insight   Data Reviewed: I have personally reviewed following labs and imaging studies  CBC:  Recent Labs Lab 04/28/17 1518 04/29/17 0230 04/29/17 0520 04/29/17 1402 04/30/17 0425  WBC 6.6 4.6  --   --  3.9*  HGB 8.4* 6.9*  --  7.8* 8.0*  HCT 24.8* 20.3*  --  23.3* 24.2*  MCV 107.4* 109.1*  --   --  102.5*  PLT 120* 89* 94*  --  99*   Basic Metabolic Panel:  Recent Labs Lab 04/28/17 1518 04/29/17 0230 04/30/17 0425  NA 137 138 136  K 3.4* 3.2* 4.3  CL 103 108 112*  CO2 21* 21* 17*  GLUCOSE 99 82 75  BUN <5* <5* <5*  CREATININE 0.86 0.88 0.97  CALCIUM 8.6* 7.4* 7.1*   GFR: Estimated Creatinine Clearance: 61.4 mL/min (by C-G formula based on SCr of 0.97 mg/dL). Liver Function Tests:  Recent Labs Lab 04/28/17 1518 04/30/17 0425  AST 75* 59*  ALT 22 18  ALKPHOS 174* 158*  BILITOT 2.5* 1.6*  PROT 7.3 5.9*  ALBUMIN 3.2* 2.9*    Recent Labs Lab 04/28/17 1518  LIPASE 23   No results for input(s): AMMONIA in the last 168 hours. Coagulation Profile:  Recent Labs Lab 04/29/17 0520  INR 1.81   Cardiac Enzymes: No results for input(s): CKTOTAL, CKMB, CKMBINDEX, TROPONINI in the last 168 hours. BNP (last 3 results) No results for input(s): PROBNP in the last 8760 hours. HbA1C: No results for input(s): HGBA1C in the last 72 hours. CBG: No results for input(s): GLUCAP in the last 168 hours. Lipid Profile: No results for input(s): CHOL, HDL, LDLCALC, TRIG,  CHOLHDL, LDLDIRECT in the last 72 hours. Thyroid Function Tests: No results for input(s): TSH, T4TOTAL, FREET4, T3FREE, THYROIDAB in the last 72 hours. Anemia Panel: No results for input(s): VITAMINB12, FOLATE, FERRITIN, TIBC, IRON, RETICCTPCT in the last 72 hours. Urine analysis:    Component Value Date/Time   COLORURINE AMBER (A) 04/29/2017 0519   APPEARANCEUR CLOUDY (A) 04/29/2017 0519   LABSPEC 1.008 04/29/2017 0519   PHURINE 6.0 04/29/2017 0519   GLUCOSEU NEGATIVE 04/29/2017 0519   HGBUR SMALL (A) 04/29/2017 0519   BILIRUBINUR NEGATIVE 04/29/2017 0519   BILIRUBINUR negative 05/19/2016 1638   KETONESUR NEGATIVE 04/29/2017 0519   PROTEINUR 30 (A) 04/29/2017 0519   UROBILINOGEN 0.2 05/19/2016 1638   UROBILINOGEN 0.2 08/17/2015 1853   NITRITE NEGATIVE 04/29/2017  [place] (A) 04/29/2017 0519   Sepsis Labs: @LABRCNTIP (procalcitonin:4,lacticidven:4)  ) Recent Results (from the past 240 hour(s))  C difficile quick scan w PCR reflex     Status: None   Collection Time: 04/28/17 11:15 PM  Result Value Ref Range Status   C Diff antigen NEGATIVE NEGATIVE Final   C Diff toxin NEGATIVE NEGATIVE Final   C Diff interpretation No C. difficile detected.  Final  Culture, Urine     Status: Abnormal   Collection Time: 04/29/17  5:19 AM  Result Value Ref Range Status   Specimen Description URINE, RANDOM  Final   Special Requests NONE  Final   Culture >=100,000 COLONIES/mL KLEBSIELLA PNEUMONIAE (A)  Final   Report Status 05/01/2017 FINAL  Final   Organism ID, Bacteria KLEBSIELLA PNEUMONIAE (A)  Final      Susceptibility   Klebsiella pneumoniae - MIC*    AMPICILLIN >=32 RESISTANT Resistant     CEFAZOLIN <=4 SENSITIVE Sensitive     CEFTRIAXONE <=1 SENSITIVE Sensitive     CIPROFLOXACIN <=0.25 SENSITIVE Sensitive     GENTAMICIN <=1 SENSITIVE Sensitive     IMIPENEM <=0.25 SENSITIVE Sensitive     NITROFURANTOIN 128 RESISTANT Resistant     TRIMETH/SULFA <=20 SENSITIVE  Sensitive     AMPICILLIN/SULBACTAM >=32 RESISTANT Resistant     PIP/TAZO 64 INTERMEDIATE Intermediate     Extended ESBL NEGATIVE Sensitive     * >=100,000 COLONIES/mL KLEBSIELLA PNEUMONIAE  Gastrointestinal Panel by PCR , Stool     Status: None   Collection Time: 04/29/17  5:20 AM  Result Value Ref Range Status   Campylobacter species NOT DETECTED NOT DETECTED Final   Plesimonas shigelloides NOT DETECTED NOT DETECTED Final   Salmonella species NOT DETECTED NOT DETECTED Final   Yersinia enterocolitica NOT DETECTED NOT DETECTED Final   Vibrio species NOT DETECTED NOT DETECTED Final   Vibrio cholerae NOT DETECTED NOT DETECTED Final   Enteroaggregative E coli (EAEC) NOT DETECTED NOT DETECTED Final   Enteropathogenic E coli (EPEC) NOT DETECTED NOT DETECTED Final   Enterotoxigenic E coli (ETEC) NOT DETECTED NOT DETECTED Final   Shiga like toxin producing E coli (STEC) NOT DETECTED NOT DETECTED Final   Shigella/Enteroinvasive E coli (EIEC) NOT DETECTED NOT DETECTED Final   Cryptosporidium NOT DETECTED NOT DETECTED Final   Cyclospora cayetanensis NOT DETECTED NOT DETECTED Final   Entamoeba histolytica NOT DETECTED NOT DETECTED Final   Giardia lamblia NOT DETECTED NOT DETECTED Final   Adenovirus F40/41 NOT DETECTED NOT DETECTED Final   Astrovirus NOT DETECTED NOT DETECTED Final   Norovirus GI/GII NOT DETECTED NOT DETECTED Final   Rotavirus A NOT DETECTED NOT DETECTED Final   Sapovirus (I, II, IV, and V) NOT DETECTED NOT DETECTED Final      Radiology Studies: Dg Abd Portable 1v  Result Date: 04/30/2017 CLINICAL DATA:  Nausea, vomiting, diarrhea. EXAM: PORTABLE ABDOMEN - 1 VIEW COMPARISON:  Abdominal CT 2 days prior. FINDINGS: No dilated bowel loops to suggest obstruction. Air-filled transverse colon on prior CT is not currently as prominent. No evidence of free air. Cholecystectomy clips in the right upper quadrant. Osseous structures are intact. IMPRESSION: Nonobstructive bowel gas pattern.   No free air. Electronically Signed   By: Jeb Levering M.D.   On: 04/30/2017 23:39     Scheduled Meds: . famotidine  20 mg Oral BID  . feeding supplement  1 Container Oral TID BM  . gabapentin  600 mg Oral QHS  . levothyroxine  75 mcg Oral QAC breakfast  . magnesium chloride  1 tablet Oral BID  . nicotine  14 mg Transdermal Daily  . potassium chloride SA  40 mEq Oral BID  . pregabalin  50 mg Oral TID  . saccharomyces boulardii  250 mg Oral QODAY   Continuous Infusions: . sodium chloride 75 mL/hr at 05/01/17 1317  . cefTRIAXone (ROCEPHIN)  IV Stopped (04/30/17 1658)     LOS: 2 days   Time Spent in minutes   35 minutes  Katalin Colledge D.O. on 05/01/2017 at 3:08 PM  Between 7am to 7pm - Pager - [phone]  After 7pm go to [email or web address] - password [email or web address]  And look for the night coverage person covering for me after hours  [facility] Office  [phone]

## 2017-05-01 NOTE — Psychosocial Assessment (Signed)
Patient continues to complain of abdominal pain, and nausea. No vomiting during 7 pm-7 am shift. Reports that PRN medications for pain and nausea where effective.  Prep for procedure today 25% completed, patient refused to continue with drinking. Attempted to call GI on call and informed them one more time, no response back.  Will still keep patient NPO after 6 am.  Will continue to monitor.  Cletis Clack, RN

## 2017-05-01 NOTE — Progress Notes (Signed)
Notified by day shift nurse that patient was not able to tolerate prep since she started drinking, due to vomiting.  Patient was given PRN Zofran for nausea and encouraged to continue with prep. Patient did not vomited or complained of nausea after medication was administered but stated that her abdomen is cramping and she cannot tolerate prep. Pain PRN medication was administered, patient reported that pain is decreased  but she is refusing to continue with drinking. NP on call [place] notified and Gastroenterology was called twice without no response.  Will continue to monitor.  Kamin Niblack, RN

## 2017-05-01 NOTE — H&P (View-Only) (Signed)
Daily Rounding Note  04/30/2017, 10:22 AM  LOS: 1 day   SUBJECTIVE:   Chief complaint:  Abdominal pain mid abdomen, right >> left.   Dilaudid and Toradol prn added.  Feels bloated.   Stools less loose, soft but not formed.  No visible blood Vomited clears this AM.   Still wants to go through with colonoscopy set for 10 AM tomorrow.   OBJECTIVE:         Vital signs in last 24 hours:    Temp:  [98.1 F (36.7 C)-98.4 F (36.9 C)] 98.4 F (36.9 C) (07/12 0559) Pulse Rate:  [80-89] 80 (07/12 0559) Resp:  [16-20] 17 (07/12 0559) BP: (82-114)/(44-60) 92/60 (07/12 0559) SpO2:  [95 %-99 %] 95 % (07/12 0559) Weight:  [61.3 kg (135 lb 3.2 oz)] 61.3 kg (135 lb 3.2 oz) (07/12 0559) Last BM Date: 04/30/17 Filed Weights   04/28/17 1513 04/29/17 0016 04/30/17 0559  Weight: 59 kg (130 lb) 58.5 kg (128 lb 14.4 oz) 61.3 kg (135 lb 3.2 oz)   General: NAD.  Chronically unwell looking   Heart: RRR Chest: clear bil.  No cough or dyspnea Abdomen: soft, ND.  Mild bil mid abd pain.    Extremities: no CCE Neuro/Psych:  No tremor, no somnolence.  Fully awake and alert.  No gross weakness or tremor.     Intake/Output from previous day: 07/11 0701 - 07/12 0700 In: 796.7 [P.O.:320; Blood:376.7; IV Piggyback:100] Out: 600 [Urine:350; Stool:250]  Intake/Output this shift: Total I/O In: 60 [P.O.:60] Out: -   Lab Results:  Recent Labs  04/28/17 1518 04/29/17 0230 04/29/17 0520 04/29/17 1402 04/30/17 0425  WBC 6.6 4.6  --   --  3.9*  HGB 8.4* 6.9*  --  7.8* 8.0*  HCT 24.8* 20.3*  --  23.3* 24.2*  PLT 120* 89* 94*  --  99*   BMET  Recent Labs  04/28/17 1518 04/29/17 0230 04/30/17 0425  NA 137 138 136  K 3.4* 3.2* 4.3  CL 103 108 112*  CO2 21* 21* 17*  GLUCOSE 99 82 75  BUN <5* <5* <5*  CREATININE 0.86 0.88 0.97  CALCIUM 8.6* 7.4* 7.1*   LFT  Recent Labs  04/28/17 1518 04/30/17 0425  PROT 7.3 5.9*  ALBUMIN 3.2*  2.9*  AST 75* 59*  ALT 22 18  ALKPHOS 174* 158*  BILITOT 2.5* 1.6*   PT/INR  Recent Labs  04/29/17 0520  LABPROT 21.2*  INR 1.81   Hepatitis Panel No results for input(s): HEPBSAG, HCVAB, HEPAIGM, HEPBIGM in the last 72 hours.  Studies/Results: Ct Abdomen Pelvis Wo Contrast  Result Date: 04/28/2017 CLINICAL DATA:  Vomiting and right lower quadrant pain. Cholecystectomy. IV contrast allergy. Nonalcoholic steatohepatitis. History of colitis in 2015. EXAM: CT ABDOMEN AND PELVIS WITHOUT CONTRAST TECHNIQUE: Multidetector CT imaging of the abdomen and pelvis was performed following the standard protocol without IV contrast. COMPARISON:  01/11/2017 FINDINGS: Lower chest: Bibasilar subsegmental atelectasis. Normal heart size without pericardial or pleural effusion. Contrast in the lower esophagus. Hepatobiliary: Moderate hepatic steatosis and moderate cirrhosis. Hepatomegaly at 19.2 cm Cholecystectomy. Resolved fluid collection in the operative bed. No biliary duct dilatation. Pancreas: Equivocal peripancreatic edema, including image 32/series 3. No fluid collection or pancreatic duct dilatation. Spleen: Splenomegaly at 14.5 cm craniocaudal. Adrenals/Urinary Tract: Normal adrenal glands. Moderate left renal cortical thinning. No renal calculi or hydronephrosis. No hydroureter or ureteric calculi. No bladder calculi. Stomach/Bowel: Normal stomach, without wall thickening. Moderate  colonic wall thickening from approximately the descending/sigmoid junction proximally. Relatively featureless colon. Pericolonic edema is most apparent surrounding the ascending segment. Normal terminal ileum. Normal small bowel. Vascular/Lymphatic: Aortic and branch vessel atherosclerosis. No abdominopelvic adenopathy. Reproductive: Normal uterus and adnexa. Other: No significant free fluid. Musculoskeletal: No acute osseous abnormality. IMPRESSION: 1. Extensive colitis. Favor infection. Exclude Celsius difficile clinically.  Relative featureless colon can be seen with ulcerative colitis but could be the sequelae of repeated episodes of infectious colitis. 2. Cirrhosis, steatosis, and hepatomegaly. 3. Left renal scarring/atrophy. 4. Cannot exclude mild peripancreatic edema. Consider correlation with pancreatic enzymes. 5. Esophageal air fluid level suggests dysmotility or gastroesophageal reflux. 6.  Aortic Atherosclerosis (ICD10-I70.0). Electronically Signed   By: Abigail Miyamoto M.D.   On: 04/28/2017 20:01   Scheduled Meds: . bisacodyl  5 mg Oral Q8H  . feeding supplement  1 Container Oral TID BM  . gabapentin  600 mg Oral QHS  . levothyroxine  75 mcg Oral QAC breakfast  . magnesium chloride  1 tablet Oral BID  . nicotine  14 mg Transdermal Daily  . potassium chloride SA  40 mEq Oral BID  . pregabalin  50 mg Oral TID  . saccharomyces boulardii  250 mg Oral QODAY   Continuous Infusions: . sodium chloride 75 mL/hr at 04/30/17 0830   PRN Meds:.acetaminophen **OR** acetaminophen, diazepam, HYDROmorphone (DILAUDID) injection, ketorolac, ondansetron **OR** ondansetron (ZOFRAN) IV, oxyCODONE-acetaminophen, promethazine  ASSESMENT:   *  Acute on chronic abdominal pain with N/V.  Acute colitis per CT. Given hypotension, rule out ischemic colitis. Although CT reading suggested infectious etiology, both C. difficile and stool pathogen panel are negative.  Hx C diff in 2015.    *  Hepatic steatosis and probable cirrhosis.  Multiple non-revealing lab tests. Suspect ETOH/fatty liver as cause. Has not had liver biopsy or elastography.   Planned intra-op liver biopsy 12/2016 at the time of cholecystectomy was not pursued due to complications of operative bleeding.  *  Acute on chronic anemia. Macrocytic. Vigorous response to PRBC x 1.  No overt bleeding. Macrocytosis and periodic anemia date back several years but hemoglobins trending worse over several months and acutely worse this admission.  *  Coagulopathy.   Thrombocytopenia (non-critical)/splenomegaly. Likely sequela of her liver disease.  *  ETOH abuse, not in remission.    *  [facility] spotted fever dx 5/23, completed 2 plus weeks of doxycycline.    PLAN   *  Colonoscopy at 10 AM tomorrow.  Stopped Rocephin and Flagyl.  Prep with   *  Adding Pepcid (no PPI given hx C diff) for n/v and hx gastritis    Azucena Freed  04/30/2017, 10:22 AM Pager: [phone]  Addendum: I have taken an interval history, reviewed the chart, and examined the patient. I agree with the Advanced Practitioner's note and impression. Plan colonoscopy tomorrow to evaluate colitis seen by CT and recent diarrhea

## 2017-05-01 NOTE — Anesthesia Procedure Notes (Signed)
Procedure Name: MAC Date/Time: 05/01/2017 11:26 AM Performed by: Izora Gala Pre-anesthesia Checklist: Patient identified, Emergency Drugs available, Suction available and Patient being monitored Patient Re-evaluated:Patient Re-evaluated prior to induction Oxygen Delivery Method: Simple face mask Preoxygenation: Pre-oxygenation with 100% oxygen Induction Type: IV induction Placement Confirmation: positive ETCO2

## 2017-05-01 NOTE — Progress Notes (Signed)
Report received from endo re colonoscopy

## 2017-05-01 NOTE — Transfer of Care (Signed)
Immediate Anesthesia Transfer of Care Note  Patient: Jamie Allen  Procedure(s) Performed: Procedure(s): COLONOSCOPY (N/A)  Patient Location: PACU and Endoscopy Unit  Anesthesia Type:MAC  Level of Consciousness: awake  Airway & Oxygen Therapy: Patient Spontanous Breathing  Post-op Assessment: Report given to RN  Post vital signs: Reviewed and stable  Last Vitals:  Vitals:   05/01/17 0449 05/01/17 1039  BP: (!) 106/54 (!) 120/57  Pulse: 66   Resp: 18 17  Temp: 36.9 C 37.1 C    Last Pain:  Vitals:   05/01/17 1039  TempSrc: Oral  PainSc: 10-Worst pain ever      Patients Stated Pain Goal: 5 (40/34/74 2595)  Complications: No apparent anesthesia complications

## 2017-05-01 NOTE — Progress Notes (Signed)
[place]ath offered pt stated that she took it this morning

## 2017-05-01 NOTE — Progress Notes (Signed)
Pt is gone to Endo. Endo staff made aware of pt's lack of prep, per endo staff MD is aware and would like to proceed.

## 2017-05-02 ENCOUNTER — Inpatient Hospital Stay (HOSPITAL_COMMUNITY): Payer: Self-pay

## 2017-05-02 DIAGNOSIS — N390 Urinary tract infection, site not specified: Secondary | ICD-10-CM

## 2017-05-02 DIAGNOSIS — R140 Abdominal distension (gaseous): Secondary | ICD-10-CM

## 2017-05-02 LAB — COMPREHENSIVE METABOLIC PANEL
ALK PHOS: 145 U/L — AB (ref 38–126)
ALT: 15 U/L (ref 14–54)
AST: 44 U/L — AB (ref 15–41)
Albumin: 2.9 g/dL — ABNORMAL LOW (ref 3.5–5.0)
Anion gap: 9 (ref 5–15)
BILIRUBIN TOTAL: 1.4 mg/dL — AB (ref 0.3–1.2)
CALCIUM: 7.1 mg/dL — AB (ref 8.9–10.3)
CO2: 16 mmol/L — ABNORMAL LOW (ref 22–32)
CREATININE: 0.77 mg/dL (ref 0.44–1.00)
Chloride: 112 mmol/L — ABNORMAL HIGH (ref 101–111)
GFR calc Af Amer: >60 mL/min (ref >60)
Glucose, Bld: 75 mg/dL (ref 65–99)
POTASSIUM: 3.6 mmol/L (ref 3.5–5.1)
Sodium: 137 mmol/L (ref 135–145)
TOTAL PROTEIN: 6.2 g/dL — AB (ref 6.5–8.1)

## 2017-05-02 LAB — CBC
HEMATOCRIT: 24.7 % — AB (ref 36.0–46.0)
Hemoglobin: 8 g/dL — ABNORMAL LOW (ref 12.0–15.0)
MCH: 33.8 pg (ref 26.0–34.0)
MCHC: 32.4 g/dL (ref 30.0–36.0)
MCV: 104.2 fL — AB (ref 78.0–100.0)
Platelets: 101 10*3/uL — ABNORMAL LOW (ref 150–400)
RBC: 2.37 MIL/uL — ABNORMAL LOW (ref 3.87–5.11)
RDW: 25.5 % — AB (ref 11.5–15.5)
WBC: 3.7 10*3/uL — AB (ref 4.0–10.5)

## 2017-05-02 MED ORDER — SIMETHICONE 80 MG PO CHEW
80.0000 mg | CHEWABLE_TABLET | Freq: Four times a day (QID) | ORAL | Status: DC | PRN
  Administered 2017-05-04: 80 mg via ORAL
  Filled 2017-05-02: qty 1

## 2017-05-02 NOTE — Progress Notes (Signed)
PROGRESS NOTE    Jamie Allen  NLG:[id number] DOB: [birth year] DOA: 04/28/2017 PCP: Boykin Nearing, MD   Chief Complaint  Patient presents with  . Abdominal Pain    Brief Narrative:  HPI on 04/29/2017 by Dr. Jennette Kettle Jamie Allen is a 46 y.o. female with medical history significant of C.Diff diarrhea, chronic cholecystitis s/p lap chole 3/8.  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  Finished course of doxycycline.  Patient developed severe generalized abd pain, nausea, vomiting, diarrhea.  Onset yesterday, worsening.  Nothing makes better or worse.  Diarrhea smells like C.Diff she says. Assessment & Plan   Colitis/ Abdominal distention -CT of abdomen showed extensive colitis, favor infection. Cirrhosis, steatosis, hepatomegaly -GI consulted and appreciated -CDiff negative -Was initially placed on vanc/flagyl- discontinued by Gastroenterology -Status post colonoscopy: Mild erythematous mucosa without hepatic flexure and descending colon. Enteric: With mild edema and mild friability without ulceration or erosion. Not consistent with IBD, biopsies taken.internal hemorrhoids -Gastroenterology recommended continuing floor store, adding Lomotil 4 times a day when necessary for diarrhea and awaiting pathology results. -Not sure if abdominal distention related to the colitis.  -Abd Xray on 7/12: Nonobstructive gas bowel pattern -Patient feels more distention this morning. Will order abdominal ultrasound to assess for fluid although doubt this is secondary to ascites. 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 Pending biopsy results  Consultants Gastroenterology  Procedures  Colonoscopy  Antibiotics   Anti-infectives    Start     Dose/Rate Route Frequency Ordered Stop   04/30/17 1500  cefTRIAXone (ROCEPHIN) 2 g in dextrose 5 % 50 mL IVPB     2 g 100 mL/hr over 30 Minutes Intravenous Daily 04/30/17 1404     04/29/17 0500  cefTRIAXone (ROCEPHIN) 2 g in dextrose 5 % 50 mL IVPB  Status:  Discontinued     2 g 100 mL/hr over 30 Minutes Intravenous Daily 04/29/17 0418 04/30/17 1026   04/29/17 0030  vancomycin (VANCOCIN) 50 mg/mL oral solution 500 mg  Status:  Discontinued     500 mg Oral Every 6 hours 04/28/17 2114 04/29/17 0418   04/28/17 2115  metroNIDAZOLE (FLAGYL) IVPB 500 mg  Status:  Discontinued     500 mg 100 mL/hr over 60 Minutes Intravenous Every 8 hours 04/28/17 2114 04/30/17 1026      Subjective:   Jamie Allen seen and examined today.  Continues to complain of abdominal distention feeling that is worse today. Denies any nausea or vomiting. Feels she cannot eat or drink given her abdominal distention. Denies gas passage. Feels bowel movements are more formed. Denies chest pain, shortness of breath, headache or dizziness.   Objective:   Vitals:   05/01/17 1925 05/01/17 2314 05/02/17 0541 05/02/17 1143  BP: Marland Kitchen)  100/48 105/60 110/60 (!) 110/55  Pulse: 90 83 80 85  Resp: 18  18 18   Temp: 98.8 F (37.1 C)  98 F (36.7 C) 98.1 F (36.7 C)  TempSrc: Oral  Oral Oral  SpO2: 99% 95% 96% 96%  Weight:   64  kg (141 lb)   Height:        Intake/Output Summary (Last 24 hours) at 05/02/17 1432 Last data filed at 05/02/17 1300  Gross per 24 hour  Intake           2872.5 ml  Output             1450 ml  Net           1422.5 ml   Filed Weights   05/01/17 0449 05/01/17 1039 05/02/17 0541  Weight: 62.6 kg (138 lb 1.6 oz) 62.6 kg (138 lb) 64 kg (141 lb)   Exam  General: Well developed, Ill appearing, no apparent distress   HEENT: NCAT, mucous membranes moist.   Cardiovascular: S1 S2 auscultated, RRR, no murmurs  Respiratory: Clear to auscultation bilaterally with equal chest rise  Abdomen: Nontender, +distended, + bowel sounds  Extremities: warm dry without cyanosis clubbing or edema  Neuro: AAOx3, nonfocal  Psych: Appropriate mood and insight  Data Reviewed: I have personally reviewed following labs and imaging studies  CBC:  Recent Labs Lab 04/28/17 1518 04/29/17 0230 04/29/17 0520 04/29/17 1402 04/30/17 0425 05/02/17 0516  WBC 6.6 4.6  --   --  3.9* 3.7*  HGB 8.4* 6.9*  --  7.8* 8.0* 8.0*  HCT 24.8* 20.3*  --  23.3* 24.2* 24.7*  MCV 107.4* 109.1*  --   --  102.5* 104.2*  PLT 120* 89* 94*  --  99* 810*   Basic Metabolic Panel:  Recent Labs Lab 04/28/17 1518 04/29/17 0230 04/30/17 0425 05/02/17 0516  NA 137 138 136 137  K 3.4* 3.2* 4.3 3.6  CL 103 108 112* 112*  CO2 21* 21* 17* 16*  GLUCOSE 99 82 75 75  BUN <5* <5* <5* <5*  CREATININE 0.86 0.88 0.97 0.77  CALCIUM 8.6* 7.4* 7.1* 7.1*   GFR: Estimated Creatinine Clearance: 75.3 mL/min (by C-G formula based on SCr of 0.77 mg/dL). Liver Function Tests:  Recent Labs Lab 04/28/17 1518 04/30/17 0425 05/02/17 0516  AST 75* 59* 44*  ALT 22 18 15   ALKPHOS 174* 158* 145*  BILITOT 2.5* 1.6* 1.4*  PROT 7.3 5.9* 6.2*  ALBUMIN 3.2* 2.9* 2.9*    Recent Labs Lab 04/28/17 1518  LIPASE 23   No results for input(s): AMMONIA in the last 168 hours. Coagulation Profile:  Recent Labs Lab 04/29/17 0520  INR  1.81   Cardiac Enzymes: No results for input(s): CKTOTAL, CKMB, CKMBINDEX, TROPONINI in the last 168 hours. BNP (last 3 results) No results for input(s): PROBNP in the last 8760 hours. HbA1C: No results for input(s): HGBA1C in the last 72 hours. CBG: No results for input(s): GLUCAP in the last 168 hours. Lipid Profile: No results for input(s): CHOL, HDL, LDLCALC, TRIG, CHOLHDL, LDLDIRECT in the last 72 hours. Thyroid Function Tests: No results for input(s): TSH, T4TOTAL, FREET4, T3FREE, THYROIDAB in the last 72 hours. Anemia Panel: No results for input(s): VITAMINB12, FOLATE, FERRITIN, TIBC, IRON, RETICCTPCT in the last 72 hours. Urine analysis:    Component Value Date/Time   COLORURINE AMBER (A) 04/29/2017 0519   APPEARANCEUR CLOUDY (A) 04/29/2017 0519   LABSPEC 1.008 04/29/2017 0519   PHURINE 6.0 04/29/2017 0519  GLUCOSEU NEGATIVE 04/29/2017 0519   HGBUR SMALL (A) 04/29/2017 0519   BILIRUBINUR NEGATIVE 04/29/2017 0519   BILIRUBINUR negative 05/19/2016 1638   KETONESUR NEGATIVE 04/29/2017 0519   PROTEINUR 30 (A) 04/29/2017 0519   UROBILINOGEN 0.2 05/19/2016 1638   UROBILINOGEN 0.2 08/17/2015 1853   NITRITE NEGATIVE 04/29/2017 0519   LEUKOCYTESUR LARGE (A) 04/29/2017 0519   Sepsis Labs: @LABRCNTIP (procalcitonin:4,lacticidven:4)  ) Recent Results (from the past 240 hour(s))  C difficile quick scan w PCR reflex     Status: None   Collection Time: 04/28/17 11:15 PM  Result Value Ref Range Status   C Diff antigen NEGATIVE NEGATIVE Final   C Diff toxin NEGATIVE NEGATIVE Final   C Diff interpretation No C. difficile detected.  Final  Culture, Urine     Status: Abnormal   Collection Time: 04/29/17  5:19 AM  Result Value Ref Range Status   Specimen Description URINE, RANDOM  Final   Special Requests NONE  Final   Culture >=100,000 COLONIES/mL KLEBSIELLA PNEUMONIAE (A)  Final   Report Status 05/01/2017 FINAL  Final   Organism ID, Bacteria KLEBSIELLA PNEUMONIAE (A)  Final        Susceptibility   Klebsiella pneumoniae - MIC*    AMPICILLIN >=32 RESISTANT Resistant     CEFAZOLIN <=4 SENSITIVE Sensitive     CEFTRIAXONE <=1 SENSITIVE Sensitive     CIPROFLOXACIN <=0.25 SENSITIVE Sensitive     GENTAMICIN <=1 SENSITIVE Sensitive     IMIPENEM <=0.25 SENSITIVE Sensitive     NITROFURANTOIN 128 RESISTANT Resistant     TRIMETH/SULFA <=20 SENSITIVE Sensitive     AMPICILLIN/SULBACTAM >=32 RESISTANT Resistant     PIP/TAZO 64 INTERMEDIATE Intermediate     Extended ESBL NEGATIVE Sensitive     * >=100,000 COLONIES/mL KLEBSIELLA PNEUMONIAE  Gastrointestinal Panel by PCR , Stool     Status: None   Collection Time: 04/29/17  5:20 AM  Result Value Ref Range Status   Campylobacter species NOT DETECTED NOT DETECTED Final   Plesimonas shigelloides NOT DETECTED NOT DETECTED Final   Salmonella species NOT DETECTED NOT DETECTED Final   Yersinia enterocolitica NOT DETECTED NOT DETECTED Final   Vibrio species NOT DETECTED NOT DETECTED Final   Vibrio cholerae NOT DETECTED NOT DETECTED Final   Enteroaggregative E coli (EAEC) NOT DETECTED NOT DETECTED Final   Enteropathogenic E coli (EPEC) NOT DETECTED NOT DETECTED Final   Enterotoxigenic E coli (ETEC) NOT DETECTED NOT DETECTED Final   Shiga like toxin producing E coli (STEC) NOT DETECTED NOT DETECTED Final   Shigella/Enteroinvasive E coli (EIEC) NOT DETECTED NOT DETECTED Final   Cryptosporidium NOT DETECTED NOT DETECTED Final   Cyclospora cayetanensis NOT DETECTED NOT DETECTED Final   Entamoeba histolytica NOT DETECTED NOT DETECTED Final   Giardia lamblia NOT DETECTED NOT DETECTED Final   Adenovirus F40/41 NOT DETECTED NOT DETECTED Final   Astrovirus NOT DETECTED NOT DETECTED Final   Norovirus GI/GII NOT DETECTED NOT DETECTED Final   Rotavirus A NOT DETECTED NOT DETECTED Final   Sapovirus (I, II, IV, and V) NOT DETECTED NOT DETECTED Final      Radiology Studies: Dg Abd Portable 1v  Result Date: 04/30/2017 CLINICAL DATA:   Nausea, vomiting, diarrhea. EXAM: PORTABLE ABDOMEN - 1 VIEW COMPARISON:  Abdominal CT 2 days prior. FINDINGS: No dilated bowel loops to suggest obstruction. Air-filled transverse colon on prior CT is not currently as prominent. No evidence of free air. Cholecystectomy clips in the right upper quadrant. Osseous structures are intact. IMPRESSION: Nonobstructive bowel  gas pattern.  No free air. Electronically Signed   By: Jeb Levering M.D.   On: 04/30/2017 23:39     Scheduled Meds: . famotidine  20 mg Oral BID  . feeding supplement  1 Container Oral TID BM  . gabapentin  600 mg Oral QHS  . levothyroxine  75 mcg Oral QAC breakfast  . magnesium chloride  1 tablet Oral BID  . nicotine  14 mg Transdermal Daily  . potassium chloride SA  40 mEq Oral BID  . pregabalin  50 mg Oral TID  . saccharomyces boulardii  250 mg Oral QODAY   Continuous Infusions: . sodium chloride 75 mL/hr at 05/02/17 0605  . cefTRIAXone (ROCEPHIN)  IV Stopped (05/01/17 1848)     LOS: 3 days   Time Spent in minutes   35 minutes  Nkosi Cortright D.O. on 05/02/2017 at 2:32 PM  Between 7am to 7pm - Pager - [phone]  After 7pm go to [email or web address] - password [email or web address]  And look for the night coverage person covering for me after hours  [facility] Office  [phone]

## 2017-05-03 DIAGNOSIS — E8770 Fluid overload, unspecified: Secondary | ICD-10-CM

## 2017-05-03 LAB — CBC
HCT: 22.8 % — ABNORMAL LOW (ref 36.0–46.0)
Hemoglobin: 7.6 g/dL — ABNORMAL LOW (ref 12.0–15.0)
MCH: 34.9 pg — AB (ref 26.0–34.0)
MCHC: 33.3 g/dL (ref 30.0–36.0)
MCV: 104.6 fL — AB (ref 78.0–100.0)
PLATELETS: 97 10*3/uL — AB (ref 150–400)
RBC: 2.18 MIL/uL — AB (ref 3.87–5.11)
RDW: 25.2 % — AB (ref 11.5–15.5)
WBC: 3.9 10*3/uL — ABNORMAL LOW (ref 4.0–10.5)

## 2017-05-03 LAB — COMPREHENSIVE METABOLIC PANEL
ALT: 14 U/L (ref 14–54)
ANION GAP: 5 (ref 5–15)
AST: 46 U/L — ABNORMAL HIGH (ref 15–41)
Albumin: 2.8 g/dL — ABNORMAL LOW (ref 3.5–5.0)
Alkaline Phosphatase: 134 U/L — ABNORMAL HIGH (ref 38–126)
BUN: <5 mg/dL — ABNORMAL LOW (ref 6–20)
CALCIUM: 7 mg/dL — AB (ref 8.9–10.3)
CHLORIDE: 114 mmol/L — AB (ref 101–111)
CO2: 17 mmol/L — ABNORMAL LOW (ref 22–32)
CREATININE: 0.9 mg/dL (ref 0.44–1.00)
GFR calc non Af Amer: >60 mL/min (ref >60)
Glucose, Bld: 72 mg/dL (ref 65–99)
Potassium: 4.3 mmol/L (ref 3.5–5.1)
Sodium: 136 mmol/L (ref 135–145)
Total Bilirubin: 1.4 mg/dL — ABNORMAL HIGH (ref 0.3–1.2)
Total Protein: 6.2 g/dL — ABNORMAL LOW (ref 6.5–8.1)

## 2017-05-03 MED ORDER — FUROSEMIDE 10 MG/ML IJ SOLN
20.0000 mg | Freq: Once | INTRAMUSCULAR | Status: AC
  Administered 2017-05-03: 20 mg via INTRAVENOUS
  Filled 2017-05-03: qty 2

## 2017-05-03 MED ORDER — CEFUROXIME AXETIL 500 MG PO TABS
250.0000 mg | ORAL_TABLET | Freq: Two times a day (BID) | ORAL | Status: AC
  Administered 2017-05-03 – 2017-05-04 (×2): 250 mg via ORAL
  Filled 2017-05-03: qty 0.5
  Filled 2017-05-03: qty 1

## 2017-05-03 NOTE — Progress Notes (Signed)
PROGRESS NOTE    Jamie Allen  LOV:[id number] DOB: 1970-11-10 DOA: 04/28/2017 PCP: Boykin Nearing, MD   Chief Complaint  Patient presents with  . Abdominal Pain    Brief Narrative:  HPI on 04/29/2017 by Dr. Jennette Kettle Jamie Allen is a 46 y.o. female with medical history significant of C.Diff diarrhea, chronic cholecystitis s/p lap chole 3/8.  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  Finished course of doxycycline.  Patient developed severe generalized abd pain, nausea, vomiting, diarrhea.  Onset yesterday, worsening.  Nothing makes better or worse.  Diarrhea smells like C.Diff she says. Assessment & Plan   Colitis/ Chronic Abdominal distention -CT of abdomen showed extensive colitis, favor infection. Cirrhosis, steatosis, hepatomegaly -GI consulted and appreciated -CDiff negative -Was initially placed on vanc/flagyl- discontinued by Gastroenterology -Status post colonoscopy: Mild erythematous mucosa without hepatic flexure and descending colon. Enteric: With mild edema and mild friability without ulceration or erosion. Not consistent with IBD, biopsies taken.internal hemorrhoids -Gastroenterology recommended continuing floor store, adding Lomotil 4 times a day when necessary for diarrhea and awaiting pathology results. -Not sure if abdominal distention related to the colitis.  -Abd Xray on 7/12: Nonobstructive gas bowel pattern -Patient feels more distention this morning. Will order abdominal ultrasound to assess for fluid although doubt this is secondary to ascites. -Continue simethicone PRN  Urinary tract infection -Urine culture >100K Klebsiella pneumoniae -Continue ceftriaxone   Acute on chronic macrocytic Anemia -Patient received 1 unit PRBC on 04/29/2017 -Slight drop in hemoglobin today, 7.6. 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 -Will give 1 dose of IV Lasix 20 mg -Will order echocardiogram  DVT Prophylaxis  SCDs  Code Status: Full  Family Communication: None bedside  Disposition Plan: Admitted. Pending biopsy results  Consultants Gastroenterology  Procedures  Colonoscopy  Antibiotics   Anti-infectives    Start     Dose/Rate Route Frequency Ordered Stop   04/30/17 1500  cefTRIAXone (ROCEPHIN) 2 g in dextrose 5 % 50 mL IVPB     2 g 100 mL/hr over 30 Minutes Intravenous Daily 04/30/17 1404     04/29/17 0500  cefTRIAXone (ROCEPHIN) 2 g in dextrose 5 % 50 mL IVPB  Status:  Discontinued     2 g 100 mL/hr over 30 Minutes Intravenous Daily 04/29/17 0418 04/30/17 1026   04/29/17 0030  vancomycin (VANCOCIN) 50 mg/mL oral solution 500 mg  Status:  Discontinued     500 mg Oral Every 6 hours 04/28/17 2114 04/29/17 0418   04/28/17 2115  metroNIDAZOLE (FLAGYL) IVPB 500 mg  Status:  Discontinued     500 mg 100 mL/hr over 60 Minutes Intravenous Every 8 hours 04/28/17 2114 04/30/17 1026      Subjective:   Jamie Allen seen and examined today.  Patient continues complaining of abdominal distention. Upon further questioning, she states this has been an ongoing issue. Currently denies any nausea or  vomiting, or abdominal pain. Denies any chest pain, shortness of breath, dizziness or headache. Patient does complain of feeling swollen particularly in her  legs.  Objective:   Vitals:   05/02/17 1143 05/02/17 2111 05/03/17 0638 05/03/17 0837  BP: (!) 110/55 (!) 91/52 115/71   Pulse: 85 87 95   Resp: 18 17 18    Temp: 98.1 F (36.7 C) 98.7 F (37.1 C) 98.5 F (36.9 C) 99 F (37.2 C)  TempSrc: Oral Oral Oral   SpO2: 96% 94% 95%   Weight:   66.5 kg (146 lb 9.6 oz)   Height:        Intake/Output Summary (Last 24 hours) at 05/03/17 1346 Last data filed at 05/03/17 0924  Gross per 24 hour  Intake             1910 ml  Output              650 ml  Net             1260 ml   Filed Weights   05/01/17 1039 05/02/17 0541 05/03/17 2620  Weight: 62.6 kg (138 lb) 64 kg (141 lb) 66.5 kg (146 lb 9.6 oz)   Exam  General: Well developed, Chronically ill-appearing, NAD, appears stated age  HEENT: NCAT, mucous membranes moist. Poor dentition  Cardiovascular: S1 S2 auscultated, no rubs, murmurs or gallops. Regular rate and rhythm.  Respiratory: Clear to auscultation bilaterally with equal chest rise  Abdomen: Soft, nontender, +distended, + bowel sounds  Extremities: warm dry without cyanosis clubbing. Trace lower extremity edema  Neuro: AAOx3, nonfocal  Psych: Normal affect and demeanor with intact judgement and insight  Data Reviewed: I have personally reviewed following labs and imaging studies  CBC:  Recent Labs Lab 04/28/17 1518 04/29/17 0230 04/29/17 0520 04/29/17 1402 04/30/17 0425 05/02/17 0516 05/03/17 0428  WBC 6.6 4.6  --   --  3.9* 3.7* 3.9*  HGB 8.4* 6.9*  --  7.8* 8.0* 8.0* 7.6*  HCT 24.8* 20.3*  --  23.3* 24.2* 24.7* 22.8*  MCV 107.4* 109.1*  --   --  102.5* 104.2* 104.6*  PLT 120* 89* 94*  --  99* 101* 97*   Basic Metabolic Panel:  Recent Labs Lab 04/28/17 1518 04/29/17 0230 04/30/17 0425 05/02/17 0516 05/03/17 0428  NA 137 138 136 137 136  K 3.4* 3.2* 4.3 3.6 4.3  CL 103 108 112* 112* 114*  CO2 21* 21* 17* 16* 17*  GLUCOSE 99 82 75 75 72  BUN <5* <5* <5* <5* <5*  CREATININE 0.86 0.88 0.97 0.77  0.90  CALCIUM 8.6* 7.4* 7.1* 7.1* 7.0*   GFR: Estimated Creatinine Clearance: 68.2 mL/min (by C-G formula based on SCr of 0.9 mg/dL). Liver Function Tests:  Recent Labs Lab 04/28/17 1518 04/30/17 0425 05/02/17 0516 05/03/17 0428  AST 75* 59* 44* 46*  ALT 22 18 15 14   ALKPHOS 174* 158* 145* 134*  BILITOT 2.5* 1.6* 1.4* 1.4*  PROT 7.3 5.9* 6.2* 6.2*  ALBUMIN 3.2* 2.9* 2.9* 2.8*    Recent Labs Lab 04/28/17 1518  LIPASE 23   No results for input(s): AMMONIA in the last 168 hours. Coagulation Profile:  Recent Labs Lab 04/29/17 0520  INR 1.81   Cardiac Enzymes: No results for input(s): CKTOTAL, CKMB, CKMBINDEX, TROPONINI in the last 168 hours. BNP (last 3 results) No results for input(s): PROBNP in the last 8760 hours. HbA1C: No results for input(s): HGBA1C in the last 72 hours. CBG: No results for  input(s): GLUCAP in the last 168 hours. Lipid Profile: No results for input(s): CHOL, HDL, LDLCALC, TRIG, CHOLHDL, LDLDIRECT in the last 72 hours. Thyroid Function Tests: No results for input(s): TSH, T4TOTAL, FREET4, T3FREE, THYROIDAB in the last 72 hours. Anemia Panel: No results for input(s): VITAMINB12, FOLATE, FERRITIN, TIBC, IRON, RETICCTPCT in the last 72 hours. Urine analysis:    Component Value Date/Time   COLORURINE AMBER (A) 04/29/2017 0519   APPEARANCEUR CLOUDY (A) 04/29/2017 0519   LABSPEC 1.008 04/29/2017 0519   PHURINE 6.0 04/29/2017 0519   GLUCOSEU NEGATIVE 04/29/2017 0519   HGBUR SMALL (A) 04/29/2017 0519   BILIRUBINUR NEGATIVE 04/29/2017 0519   BILIRUBINUR negative 05/19/2016 1638   KETONESUR NEGATIVE 04/29/2017 0519   PROTEINUR 30 (A) 04/29/2017 0519   UROBILINOGEN 0.2 05/19/2016 1638   UROBILINOGEN 0.2 08/17/2015 1853   NITRITE NEGATIVE 04/29/2017 0519   LEUKOCYTESUR LARGE (A) 04/29/2017 0519   Sepsis Labs: @LABRCNTIP (procalcitonin:4,lacticidven:4)  ) Recent Results (from the past 240 hour(s))  C difficile quick scan w PCR reflex      Status: None   Collection Time: 04/28/17 11:15 PM  Result Value Ref Range Status   C Diff antigen NEGATIVE NEGATIVE Final   C Diff toxin NEGATIVE NEGATIVE Final   C Diff interpretation No C. difficile detected.  Final  Culture, Urine     Status: Abnormal   Collection Time: 04/29/17  5:19 AM  Result Value Ref Range Status   Specimen Description URINE, RANDOM  Final   Special Requests NONE  Final   Culture >=100,000 COLONIES/mL KLEBSIELLA PNEUMONIAE (A)  Final   Report Status 05/01/2017 FINAL  Final   Organism ID, Bacteria KLEBSIELLA PNEUMONIAE (A)  Final      Susceptibility   Klebsiella pneumoniae - MIC*    AMPICILLIN >=32 RESISTANT Resistant     CEFAZOLIN <=4 SENSITIVE Sensitive     CEFTRIAXONE <=1 SENSITIVE Sensitive     CIPROFLOXACIN <=0.25 SENSITIVE Sensitive     GENTAMICIN <=1 SENSITIVE Sensitive     IMIPENEM <=0.25 SENSITIVE Sensitive     NITROFURANTOIN 128 RESISTANT Resistant     TRIMETH/SULFA <=20 SENSITIVE Sensitive     AMPICILLIN/SULBACTAM >=32 RESISTANT Resistant     PIP/TAZO 64 INTERMEDIATE Intermediate     Extended ESBL NEGATIVE Sensitive     * >=100,000 COLONIES/mL KLEBSIELLA PNEUMONIAE  Gastrointestinal Panel by PCR , Stool     Status: None   Collection Time: 04/29/17  5:20 AM  Result Value Ref Range Status   Campylobacter species NOT DETECTED NOT DETECTED Final   Plesimonas shigelloides NOT DETECTED NOT DETECTED Final   Salmonella species NOT DETECTED NOT DETECTED Final   Yersinia enterocolitica NOT DETECTED NOT DETECTED Final   Vibrio species NOT DETECTED NOT DETECTED Final   Vibrio cholerae NOT DETECTED NOT DETECTED Final   Enteroaggregative E coli (EAEC) NOT DETECTED NOT DETECTED Final   Enteropathogenic E coli (EPEC) NOT DETECTED NOT DETECTED Final   Enterotoxigenic E coli (ETEC) NOT DETECTED NOT DETECTED Final   Shiga like toxin producing E coli (STEC) NOT DETECTED NOT DETECTED Final   Shigella/Enteroinvasive E coli (EIEC) NOT DETECTED NOT DETECTED Final     Cryptosporidium NOT DETECTED NOT DETECTED Final   Cyclospora cayetanensis NOT DETECTED NOT DETECTED Final   Entamoeba histolytica NOT DETECTED NOT DETECTED Final   Giardia lamblia NOT DETECTED NOT DETECTED Final   Adenovirus F40/41 NOT DETECTED NOT DETECTED Final   Astrovirus NOT DETECTED NOT DETECTED Final   Norovirus GI/GII NOT DETECTED NOT DETECTED Final  Rotavirus A NOT DETECTED NOT DETECTED Final   Sapovirus (I, II, IV, and V) NOT DETECTED NOT DETECTED Final      Radiology Studies: US Abdomen Complete  Result Date: 05/02/2017 CLINICAL DATA:  Abdominal distention EXAM: ABDOMEN ULTRASOUND COMPLETE COMPARISON:  CT 04/28/2017 FINDINGS: Gallbladder: Prior cholecystectomy Common bile duct: Diameter: Normal caliber, 6 mm Liver: Increased echotexture. Mildly nodular contours of the liver compatible with cirrhosis. No focal abnormality. IVC: No abnormality visualized. Pancreas: Visualized portion unremarkable. Spleen: Enlarged with a craniocaudal length of 15.7 cm and a splenic volume of 866 mL Right Kidney: Length: 12.8 cm. Echogenicity within normal limits. No mass or hydronephrosis visualized. Left Kidney: Length: 9.7 cm scarring in the upper pole with cortical thinning. No hydronephrosis. Abdominal aorta: No aneurysm visualized. Other findings: Perihepatic ascites. Bilateral pleural effusions noted. IMPRESSION: Findings compatible with hepatic cirrhosis with associated ascites and splenomegaly. Small bilateral effusions. Electronically Signed   By: Rolm Baptise M.D.   On: 05/02/2017 17:22     Scheduled Meds: . famotidine  20 mg Oral BID  . feeding supplement  1 Container Oral TID BM  . gabapentin  600 mg Oral QHS  . levothyroxine  75 mcg Oral QAC breakfast  . magnesium chloride  1 tablet Oral BID  . nicotine  14 mg Transdermal Daily  . potassium chloride SA  40 mEq Oral BID  . pregabalin  50 mg Oral TID  . saccharomyces boulardii  250 mg Oral QODAY   Continuous Infusions: .  cefTRIAXone (ROCEPHIN)  IV Stopped (05/02/17 1822)     LOS: 4 days   Time Spent in minutes   35 minutes  Haizel Gatchell D.O. on 05/03/2017 at 1:46 PM  Between 7am to 7pm - Pager - [phone]  After 7pm go to [email or web address] - password [email or web address]  And look for the night coverage person covering for me after hours  [facility] Office  [phone]

## 2017-05-03 NOTE — Progress Notes (Signed)
Patient complaining of abdominal pain to RLQ. Dilaudid 1 mg given with effectiveness noted. Ambulatory to the bathroom. No signs and symptoms of distress noted.

## 2017-05-04 ENCOUNTER — Inpatient Hospital Stay (HOSPITAL_COMMUNITY): Payer: Self-pay

## 2017-05-04 ENCOUNTER — Encounter (HOSPITAL_COMMUNITY): Payer: Self-pay | Admitting: Internal Medicine

## 2017-05-04 DIAGNOSIS — K529 Noninfective gastroenteritis and colitis, unspecified: Secondary | ICD-10-CM

## 2017-05-04 DIAGNOSIS — K7031 Alcoholic cirrhosis of liver with ascites: Secondary | ICD-10-CM

## 2017-05-04 DIAGNOSIS — K7030 Alcoholic cirrhosis of liver without ascites: Secondary | ICD-10-CM

## 2017-05-04 DIAGNOSIS — R188 Other ascites: Secondary | ICD-10-CM

## 2017-05-04 LAB — BASIC METABOLIC PANEL
Anion gap: 8 (ref 5–15)
CALCIUM: 7.5 mg/dL — AB (ref 8.9–10.3)
CO2: 16 mmol/L — ABNORMAL LOW (ref 22–32)
CREATININE: 0.75 mg/dL (ref 0.44–1.00)
Chloride: 112 mmol/L — ABNORMAL HIGH (ref 101–111)
GFR calc Af Amer: >60 mL/min (ref >60)
GLUCOSE: 69 mg/dL (ref 65–99)
POTASSIUM: 4.1 mmol/L (ref 3.5–5.1)
SODIUM: 136 mmol/L (ref 135–145)

## 2017-05-04 LAB — CBC
HCT: 21.5 % — ABNORMAL LOW (ref 36.0–46.0)
Hemoglobin: 7.2 g/dL — ABNORMAL LOW (ref 12.0–15.0)
MCH: 34.8 pg — AB (ref 26.0–34.0)
MCHC: 33.5 g/dL (ref 30.0–36.0)
MCV: 103.9 fL — AB (ref 78.0–100.0)
PLATELETS: 101 10*3/uL — AB (ref 150–400)
RBC: 2.07 MIL/uL — AB (ref 3.87–5.11)
RDW: 25.3 % — AB (ref 11.5–15.5)
WBC: 4.2 10*3/uL (ref 4.0–10.5)

## 2017-05-04 LAB — PREPARE RBC (CROSSMATCH)

## 2017-05-04 LAB — HEMOGLOBIN AND HEMATOCRIT, BLOOD
HCT: 26.2 % — ABNORMAL LOW (ref 36.0–46.0)
HEMOGLOBIN: 8.6 g/dL — AB (ref 12.0–15.0)

## 2017-05-04 MED ORDER — FUROSEMIDE 10 MG/ML IJ SOLN
40.0000 mg | Freq: Once | INTRAMUSCULAR | Status: AC
  Administered 2017-05-04: 40 mg via INTRAVENOUS
  Filled 2017-05-04: qty 4

## 2017-05-04 MED ORDER — SODIUM CHLORIDE 0.9 % IV SOLN
Freq: Once | INTRAVENOUS | Status: AC
  Administered 2017-05-04: 11:00:00 via INTRAVENOUS

## 2017-05-04 MED ORDER — COLESTIPOL HCL 1 G PO TABS
1.0000 g | ORAL_TABLET | Freq: Two times a day (BID) | ORAL | Status: DC
  Administered 2017-05-04 – 2017-05-05 (×3): 1 g via ORAL
  Filled 2017-05-04 (×3): qty 1

## 2017-05-04 MED ORDER — FOLIC ACID 1 MG PO TABS
1.0000 mg | ORAL_TABLET | Freq: Every day | ORAL | Status: DC
  Administered 2017-05-04 – 2017-05-05 (×2): 1 mg via ORAL
  Filled 2017-05-04 (×2): qty 1

## 2017-05-04 NOTE — Progress Notes (Signed)
Daily Rounding Note  05/04/2017, 1:15 PM  LOS: 5 days   SUBJECTIVE:   Chief complaint: stools loose, as always.  Reddish brown but do not look bloody to her.  Abdominal distention is worse and increased sense of pressure, bloating and then discomfort/painafter limited po.  No n/v.     Ascites on ultrasound over weekend.  Paracentesis ordered.   PRBCs transfused yesterday and today.  Hgbs 8 >> 7.6 >> 7.2 over last 3 days.     OBJECTIVE:         Vital signs in last 24 hours:    Temp:  [97.9 F (36.6 C)-99.9 F (37.7 C)] 98.8 F (37.1 C) (07/16 1130) Pulse Rate:  [91-104] 91 (07/16 1130) Resp:  [18] 18 (07/16 1130) BP: (101-111)/(39-62) 101/39 (07/16 1130) SpO2:  [93 %-97 %] 96 % (07/16 1130) Weight:  [65.4 kg (144 lb 1.6 oz)] 65.4 kg (144 lb 1.6 oz) (07/16 0607) Last BM Date: 05/04/17 Filed Weights   05/02/17 0541 05/03/17 1610 05/04/17 0607  Weight: 64 kg (141 lb) 66.5 kg (146 lb 9.6 oz) 65.4 kg (144 lb 1.6 oz)   General: looks mildly unwell. Alert, pleasant   Heart: RRR.  No MRG Chest: clear bil.  No dyspnea or cough Abdomen: distended but soft,  NT.  Active BS.  Extremities: slight ankle edema on right (has titanium plate in place from previous repair) Neuro/Psych:  Pleasant, calm, fully alert and oriented.  No deficits.   Intake/Output from previous day: 07/15 0701 - 07/16 0700 In: 945 [P.O.:720] Out: 400 [Urine:400]  Intake/Output this shift: Total I/O In: 575 [P.O.:240; Blood:335] Out: 800 [Urine:800]  Lab Results:  Recent Labs  05/02/17 0516 05/03/17 0428 05/04/17 0307  WBC 3.7* 3.9* 4.2  HGB 8.0* 7.6* 7.2*  HCT 24.7* 22.8* 21.5*  PLT 101* 97* 101*   BMET  Recent Labs  05/02/17 0516 05/03/17 0428 05/04/17 0307  NA 137 136 136  K 3.6 4.3 4.1  CL 112* 114* 112*  CO2 16* 17* 16*  GLUCOSE 75 72 69  BUN <5* <5* <5*  CREATININE 0.77 0.90 0.75  CALCIUM 7.1* 7.0* 7.5*   LFT  Recent  Labs  05/02/17 0516 05/03/17 0428  PROT 6.2* 6.2*  ALBUMIN 2.9* 2.8*  AST 44* 46*  ALT 15 14  ALKPHOS 145* 134*  BILITOT 1.4* 1.4*   PT/INR No results for input(s): LABPROT, INR in the last 72 hours. Hepatitis Panel No results for input(s): HEPBSAG, HCVAB, HEPAIGM, HEPBIGM in the last 72 hours.  Studies/Results: US Abdomen Complete  Result Date: 05/02/2017 CLINICAL DATA:  Abdominal distention EXAM: ABDOMEN ULTRASOUND COMPLETE COMPARISON:  CT 04/28/2017 FINDINGS: Gallbladder: Prior cholecystectomy Common bile duct: Diameter: Normal caliber, 6 mm Liver: Increased echotexture. Mildly nodular contours of the liver compatible with cirrhosis. No focal abnormality. IVC: No abnormality visualized. Pancreas: Visualized portion unremarkable. Spleen: Enlarged with a craniocaudal length of 15.7 cm and a splenic volume of 866 mL Right Kidney: Length: 12.8 cm. Echogenicity within normal limits. No mass or hydronephrosis visualized. Left Kidney: Length: 9.7 cm scarring in the upper pole with cortical thinning. No hydronephrosis. Abdominal aorta: No aneurysm visualized. Other findings: Perihepatic ascites. Bilateral pleural effusions noted. IMPRESSION: Findings compatible with hepatic cirrhosis with associated ascites and splenomegaly. Small bilateral effusions. Electronically Signed   By: Rolm Baptise M.D.   On: 05/02/2017 17:22    ASSESMENT:   *  Acute on chronic abdominal pain, non-bloody n/v.  Extensive  GI evaluation over the last several months including EGD (02/12/17: Erythematous gastric mucosa no significant abnormality on pathology), HIDA scan (normal liver uptake, probably small bowel visualization, patent CVD, no bile leak postcholecystectomy), MRCP ( 12/25/16. Hepatic steatosis, hepatomegaly. No gallstones, no cholecystitis. No biliary ductal dilatation. No choledocholithiasis. Spleen upper limits normal in size).  Status post laparoscopic cholecystectomy 12/25/16.  Pancolitis on CT scan 04/28/17.  c  diff and stool path panel negative.  ? Mild peripancreatic edema on CT, but Lipase normal.    05/01/17 colonoscopy. Revealed scattered nonspecific erythematous mucosa with contact erythema mucosa but no ulceration/eroisions.  Also internal hemorrhoids.  Pathology reveals unremarkable colorectal mucosa. No evidence of inflammatory bowel disease, dysplasia, malignancy.  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 Sequela includes thrombocytopenia, coagulopathy  *  Anemia. Macrocytic.  *  Abdominal distention. 04/28/17 ultrasound shows ascites, splenomegaly, cirrhosis, 17mm CBD.  Paracentesis ordered.      PLAN   *   Await results of paracentesis. Several ascites labs ordered including acid fast bacteria smear, cell count/diff, clx, cytology, glucose, fungal stain, gram stain, LDH.  I added fluid albumin testing.      Jamie Allen  05/04/2017, 1:15 PM Pager: [phone]

## 2017-05-04 NOTE — Progress Notes (Signed)
PROGRESS NOTE    Jamie Allen  HKV:[id number] DOB: Mar 11, 1971 DOA: 04/28/2017 PCP: Boykin Nearing, MD   Chief Complaint  Patient presents with  . Abdominal Pain    Brief Narrative:  HPI on 04/29/2017 by Dr. Jennette Kettle Jamie Allen is a 46 y.o. female with medical history significant of C.Diff diarrhea, chronic cholecystitis s/p lap chole 3/8.  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  Finished course of doxycycline.  Patient developed severe generalized abd pain, nausea, vomiting, diarrhea.  Onset yesterday, worsening.  Nothing makes better or worse.  Diarrhea smells like C.Diff she says. Assessment & Plan   Colitis/ Chronic Abdominal distention -CT of abdomen showed extensive colitis, favor infection. Cirrhosis, steatosis, hepatomegaly -GI consulted and appreciated -CDiff negative -Was initially placed on vanc/flagyl- discontinued by Gastroenterology -Status post colonoscopy: Mild erythematous mucosa without hepatic flexure and descending colon. Enteric: With mild edema and mild friability without ulceration or erosion. Not consistent with IBD, biopsies taken.internal hemorrhoids -Gastroenterology recommended continuing floor store, adding Lomotil 4 times a day when necessary for diarrhea and awaiting pathology results.  -Biopsy results Were unremarkable. Biopsies showed essentially unremarkable colorectal type mucosa, no evidence of idiopathic IBD, dysplasia or malignancy -Not sure if abdominal distention related to the colitis.  -Abd Xray on 7/12: Nonobstructive gas bowel pattern -Continues to complain of abdominal distention and bruising. States pain medications helped some overnight.  -Patient was given dilaudid overnight. -Will order US paracentesis with labs  Urinary tract infection -Urine culture >100K Klebsiella pneumoniae -Initially placed on IV ceftriaxone, transition to Ceftin -Will discontinue antibiotics as patient has completed  course  Acute on chronic macrocytic Anemia -Patient received 1 unit PRBC on 04/29/2017 Hemoglobin continues to drop today, currently 7.2  -IV fluids were discontinued on 05/03/2017, patient was given 1 dose of IV Lasix .  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 -Treated with doxycycline  NASH -Noted to have cirrhosis on CT abdomen -Mildly elevated INR -Supposed to have biopsy as an outpatient -AST improved, currently 44 (was 75 upon admission) -Continue to monitor CMP periodically  Thrombocytopenia -Secondary to liver disease -Platelets improving,currently 101  Alcohol abuse -Does not appear to be in withdrawal, continue to monitor closely  Volume overload -Patient states she feels somewhat swollen and continues to have abd distenstion -As above, have ordered US paracentesis -Gave lasix on 05/03/17, with little yield in urine output -Echocardiogram ordered and pending   DVT Prophylaxis  SCDs  Code Status: Full  Family Communication: None bedside  Disposition Plan: Admitted. Home when stable.  Consultants Gastroenterology  Procedures  Colonoscopy  Antibiotics   Anti-infectives    Start     Dose/Rate Route Frequency Ordered Stop   05/03/17 1630  cefUROXime (CEFTIN) tablet 250 mg     250 mg Oral 2 times daily with meals 05/03/17 1354 05/04/17 0900   04/30/17 1500  cefTRIAXone (ROCEPHIN) 2 g in dextrose 5 % 50 mL IVPB  Status:  Discontinued     2 g 100 mL/hr over 30 Minutes Intravenous Daily 04/30/17 1404 05/03/17 1354   04/29/17 0500  cefTRIAXone (ROCEPHIN) 2 g in dextrose 5 % 50 mL IVPB  Status:  Discontinued     2 g 100 mL/hr over 30 Minutes Intravenous Daily 04/29/17 0418 04/30/17 1026   04/29/17 0030  vancomycin (VANCOCIN) 50 mg/mL oral solution 500 mg  Status:  Discontinued      500 mg Oral Every 6 hours 04/28/17 2114 04/29/17 0418   04/28/17 2115  metroNIDAZOLE (FLAGYL) IVPB 500 mg  Status:  Discontinued     500 mg 100 mL/hr over 60 Minutes Intravenous Every 8 hours 04/28/17 2114 04/30/17 1026      Subjective:   Jamie Allen seen and examined today. Patient continues to complain of abdominal pain with distention. No complaints of abdominal bruising noted on the left side. Patient denies any nausea or vomiting. Currently denies chest pain, shortness of breath, dizziness or headache. Does complain of leg swelling.  Objective:   Vitals:   05/04/17 0000 05/04/17 0607 05/04/17 1100 05/04/17 1130  BP: 103/62 (!) 105/55 (!) 111/48 (!) 101/39  Pulse: 92 (!) 104 95 91  Resp: 18  18 18   Temp: 97.9 F (36.6 C) 99.9 F (37.7 C) 98.7 F (37.1 C) 98.8 F (37.1 C)  TempSrc: Oral Oral Oral Oral  SpO2: 97% 93% 95% 96%  Weight:  65.4 kg (144 lb 1.6 oz)    Height:        Intake/Output Summary (Last 24 hours) at 05/04/17 1146 Last data filed at 05/04/17 1121  Gross per 24 hour  Intake             1160 ml  Output              900 ml  Net              260 ml   Filed Weights   05/02/17 0541 05/03/17 0638 05/04/17 0607  Weight: 64 kg (141 lb) 66.5 kg (146 lb 9.6 oz) 65.4 kg (144 lb 1.6 oz)   Exam  General: Well developed, chronically ill appearing, NAD  HEENT: NCAT, mucous membranes moist. Poor dentition  Cardiovascular: S1 S2 auscultated, RRR, no murmurs  Respiratory: Clear to auscultation bilaterally with equal chest rise  Abdomen: Soft, nontender, nondistended, + bowel sounds  Extremities: warm dry without cyanosis clubbing. RLE edema  Neuro: AAOx3, nonfocal  Skin: Without rashes exudates or nodules, bruising on left side of abd  Psych: Appropriate  Data Reviewed: I have personally reviewed following labs and imaging studies  CBC:  Recent Labs Lab 04/29/17 0230 04/29/17 0520 04/29/17 1402 04/30/17 0425 05/02/17 0516 05/03/17 0428  05/04/17 0307  WBC 4.6  --   --  3.9* 3.7* 3.9* 4.2  HGB 6.9*  --  7.8* 8.0* 8.0* 7.6* 7.2*  HCT 20.3*  --  23.3* 24.2* 24.7* 22.8* 21.5*  MCV 109.1*  --   --  102.5* 104.2* 104.6* 103.9*  PLT 89* 94*  --  99* 101* 97* 226*   Basic Metabolic Panel:  Recent Labs Lab 04/29/17 0230 04/30/17 0425 05/02/17 0516 05/03/17 0428 05/04/17 0307  NA 138 136 137 136 136  K 3.2* 4.3 3.6 4.3 4.1  CL 108 112* 112* 114* 112*  CO2 21* 17* 16* 17* 16*  GLUCOSE 82 75 75 72 69  BUN <5* <5* <5* <5* <5*  CREATININE 0.88 0.97 0.77 0.90 0.75  CALCIUM 7.4* 7.1* 7.1* 7.0* 7.5*   GFR: Estimated Creatinine Clearance: 76 mL/min (by C-G formula based on SCr of 0.75 mg/dL). Liver Function Tests:  Recent Labs Lab 04/28/17 1518 04/30/17 0425 05/02/17 0516 05/03/17 0428  AST 75* 59* 44* 46*  ALT 22 18 15 14   ALKPHOS 174* 158* 145* 134*  BILITOT 2.5* 1.6* 1.4* 1.4*  PROT 7.3  5.9* 6.2* 6.2*  ALBUMIN 3.2* 2.9* 2.9* 2.8*    Recent Labs Lab 04/28/17 1518  LIPASE 23   No results for input(s): AMMONIA in the last 168 hours. Coagulation Profile:  Recent Labs Lab 04/29/17 0520  INR 1.81   Cardiac Enzymes: No results for input(s): CKTOTAL, CKMB, CKMBINDEX, TROPONINI in the last 168 hours. BNP (last 3 results) No results for input(s): PROBNP in the last 8760 hours. HbA1C: No results for input(s): HGBA1C in the last 72 hours. CBG: No results for input(s): GLUCAP in the last 168 hours. Lipid Profile: No results for input(s): CHOL, HDL, LDLCALC, TRIG, CHOLHDL, LDLDIRECT in the last 72 hours. Thyroid Function Tests: No results for input(s): TSH, T4TOTAL, FREET4, T3FREE, THYROIDAB in the last 72 hours. Anemia Panel: No results for input(s): VITAMINB12, FOLATE, FERRITIN, TIBC, IRON, RETICCTPCT in the last 72 hours. Urine analysis:    Component Value Date/Time   COLORURINE AMBER (A) 04/29/2017 0519   APPEARANCEUR CLOUDY (A) 04/29/2017 0519   LABSPEC 1.008 04/29/2017 0519   PHURINE 6.0  04/29/2017 0519   GLUCOSEU NEGATIVE 04/29/2017 0519   HGBUR SMALL (A) 04/29/2017 0519   BILIRUBINUR NEGATIVE 04/29/2017 0519   BILIRUBINUR negative 05/19/2016 1638   KETONESUR NEGATIVE 04/29/2017 0519   PROTEINUR 30 (A) 04/29/2017 0519   UROBILINOGEN 0.2 05/19/2016 1638   UROBILINOGEN 0.2 08/17/2015 1853   NITRITE NEGATIVE 04/29/2017 0519   LEUKOCYTESUR LARGE (A) 04/29/2017 0519   Sepsis Labs: @LABRCNTIP (procalcitonin:4,lacticidven:4)  ) Recent Results (from the past 240 hour(s))  C difficile quick scan w PCR reflex     Status: None   Collection Time: 04/28/17 11:15 PM  Result Value Ref Range Status   C Diff antigen NEGATIVE NEGATIVE Final   C Diff toxin NEGATIVE NEGATIVE Final   C Diff interpretation No C. difficile detected.  Final  Culture, Urine     Status: Abnormal   Collection Time: 04/29/17  5:19 AM  Result Value Ref Range Status   Specimen Description URINE, RANDOM  Final   Special Requests NONE  Final   Culture >=100,000 COLONIES/mL KLEBSIELLA PNEUMONIAE (A)  Final   Report Status 05/01/2017 FINAL  Final   Organism ID, Bacteria KLEBSIELLA PNEUMONIAE (A)  Final      Susceptibility   Klebsiella pneumoniae - MIC*    AMPICILLIN >=32 RESISTANT Resistant     CEFAZOLIN <=4 SENSITIVE Sensitive     CEFTRIAXONE <=1 SENSITIVE Sensitive     CIPROFLOXACIN <=0.25 SENSITIVE Sensitive     GENTAMICIN <=1 SENSITIVE Sensitive     IMIPENEM <=0.25 SENSITIVE Sensitive     NITROFURANTOIN 128 RESISTANT Resistant     TRIMETH/SULFA <=20 SENSITIVE Sensitive     AMPICILLIN/SULBACTAM >=32 RESISTANT Resistant     PIP/TAZO 64 INTERMEDIATE Intermediate     Extended ESBL NEGATIVE Sensitive     * >=100,000 COLONIES/mL KLEBSIELLA PNEUMONIAE  Gastrointestinal Panel by PCR , Stool     Status: None   Collection Time: 04/29/17  5:20 AM  Result Value Ref Range Status   Campylobacter species NOT DETECTED NOT DETECTED Final   Plesimonas shigelloides NOT DETECTED NOT DETECTED Final   Salmonella  species NOT DETECTED NOT DETECTED Final   Yersinia enterocolitica NOT DETECTED NOT DETECTED Final   Vibrio species NOT DETECTED NOT DETECTED Final   Vibrio cholerae NOT DETECTED NOT DETECTED Final   Enteroaggregative E coli (EAEC) NOT DETECTED NOT DETECTED Final   Enteropathogenic E coli (EPEC) NOT DETECTED NOT DETECTED Final   Enterotoxigenic E coli (ETEC) NOT DETECTED NOT  DETECTED Final   Shiga like toxin producing E coli (STEC) NOT DETECTED NOT DETECTED Final   Shigella/Enteroinvasive E coli (EIEC) NOT DETECTED NOT DETECTED Final   Cryptosporidium NOT DETECTED NOT DETECTED Final   Cyclospora cayetanensis NOT DETECTED NOT DETECTED Final   Entamoeba histolytica NOT DETECTED NOT DETECTED Final   Giardia lamblia NOT DETECTED NOT DETECTED Final   Adenovirus F40/41 NOT DETECTED NOT DETECTED Final   Astrovirus NOT DETECTED NOT DETECTED Final   Norovirus GI/GII NOT DETECTED NOT DETECTED Final   Rotavirus A NOT DETECTED NOT DETECTED Final   Sapovirus (I, II, IV, and V) NOT DETECTED NOT DETECTED Final      Radiology Studies: US Abdomen Complete  Result Date: 05/02/2017 CLINICAL DATA:  Abdominal distention EXAM: ABDOMEN ULTRASOUND COMPLETE COMPARISON:  CT 04/28/2017 FINDINGS: Gallbladder: Prior cholecystectomy Common bile duct: Diameter: Normal caliber, 6 mm Liver: Increased echotexture. Mildly nodular contours of the liver compatible with cirrhosis. No focal abnormality. IVC: No abnormality visualized. Pancreas: Visualized portion unremarkable. Spleen: Enlarged with a craniocaudal length of 15.7 cm and a splenic volume of 866 mL Right Kidney: Length: 12.8 cm. Echogenicity within normal limits. No mass or hydronephrosis visualized. Left Kidney: Length: 9.7 cm scarring in the upper pole with cortical thinning. No hydronephrosis. Abdominal aorta: No aneurysm visualized. Other findings: Perihepatic ascites. Bilateral pleural effusions noted. IMPRESSION: Findings compatible with hepatic cirrhosis with  associated ascites and splenomegaly. Small bilateral effusions. Electronically Signed   By: Rolm Baptise M.D.   On: 05/02/2017 17:22     Scheduled Meds: . famotidine  20 mg Oral BID  . feeding supplement  1 Container Oral TID BM  . gabapentin  600 mg Oral QHS  . levothyroxine  75 mcg Oral QAC breakfast  . magnesium chloride  1 tablet Oral BID  . nicotine  14 mg Transdermal Daily  . potassium chloride SA  40 mEq Oral BID  . pregabalin  50 mg Oral TID  . saccharomyces boulardii  250 mg Oral QODAY   Continuous Infusions:    LOS: 5 days   Time Spent in minutes   35 minutes  Henslee Lottman D.O. on 05/04/2017 at 11:46 AM  Between 7am to 7pm - Pager - [phone]  After 7pm go to [email or web address] - password [email or web address]  And look for the night coverage person covering for me after hours  [facility] Office  [phone]

## 2017-05-04 NOTE — Progress Notes (Signed)
Page to Dr Marc Morgans: pt still with ascites/edema- would you like to add a dose of lasix pre-RBC transfuse?

## 2017-05-04 NOTE — Progress Notes (Addendum)
Patient increased abdominal distention, U/s show ascites, right leg swelling is up and ambulatory, with walker in halls, elevated legs,  ongoing bloating and Gas, nausea and abdominal pain  Gave PRN with relief. Pt has been sleep since 0100.

## 2017-05-05 ENCOUNTER — Inpatient Hospital Stay (HOSPITAL_COMMUNITY): Payer: Self-pay

## 2017-05-05 ENCOUNTER — Telehealth: Payer: Self-pay

## 2017-05-05 DIAGNOSIS — I360 Nonrheumatic tricuspid (valve) stenosis: Secondary | ICD-10-CM

## 2017-05-05 LAB — COMPREHENSIVE METABOLIC PANEL
ALBUMIN: 2.8 g/dL — AB (ref 3.5–5.0)
ALT: 14 U/L (ref 14–54)
AST: 50 U/L — AB (ref 15–41)
Alkaline Phosphatase: 143 U/L — ABNORMAL HIGH (ref 38–126)
Anion gap: 9 (ref 5–15)
CHLORIDE: 110 mmol/L (ref 101–111)
CO2: 19 mmol/L — ABNORMAL LOW (ref 22–32)
Calcium: 8.3 mg/dL — ABNORMAL LOW (ref 8.9–10.3)
Creatinine, Ser: 0.71 mg/dL (ref 0.44–1.00)
GFR calc Af Amer: >60 mL/min (ref >60)
GLUCOSE: 105 mg/dL — AB (ref 65–99)
POTASSIUM: 4.1 mmol/L (ref 3.5–5.1)
Sodium: 138 mmol/L (ref 135–145)
Total Bilirubin: 3.7 mg/dL — ABNORMAL HIGH (ref 0.3–1.2)
Total Protein: 6.2 g/dL — ABNORMAL LOW (ref 6.5–8.1)

## 2017-05-05 LAB — ECHOCARDIOGRAM COMPLETE
Area-P 1/2: 3.28 cm2
CHL CUP MV DEC (S): 229
CHL CUP RV SYS PRESS: 52 mmHg
CHL CUP STROKE VOLUME: 42 mL
E decel time: 229 msec
EERAT: 12.95
FS: 37 % (ref 28–44)
HEIGHTINCHES: 61 in
IV/PV OW: 0.89
LADIAMINDEX: 1.73 cm/m2
LASIZE: 29 mm
LAVOL: 47 mL
LAVOLA4C: 37 mL
LAVOLIN: 28.1 mL/m2
LDCA: 2.27 cm2
LEFT ATRIUM END SYS DIAM: 29 mm
LV E/e' medial: 12.95
LV PW d: 9 mm — AB (ref 0.6–1.1)
LV sys vol: 26 mL (ref 14–42)
LVDIAVOL: 68 mL (ref 46–106)
LVDIAVOLIN: 41 mL/m2
LVEEAVG: 12.95
LVELAT: 11.2 cm/s
LVOT SV: 49 mL
LVOT VTI: 21.4 cm
LVOT diameter: 17 mm
LVOT peak grad rest: 5 mmHg
LVOTPV: 117 cm/s
LVSYSVOLIN: 15 mL/m2
Lateral S' vel: 10.3 cm/s
MV Peak grad: 8 mmHg
MV pk A vel: 114 m/s
MVPKEVEL: 145 m/s
P 1/2 time: 67 ms
RV TAPSE: 20 mm
Reg peak vel: 303 cm/s
Simpson's disk: 62
TDI e' lateral: 11.2
TDI e' medial: 9.65
TR max vel: 303 cm/s
WEIGHTICAEL: 2249.6 [oz_av]

## 2017-05-05 LAB — CBC
HEMATOCRIT: 25.1 % — AB (ref 36.0–46.0)
HEMOGLOBIN: 8.4 g/dL — AB (ref 12.0–15.0)
MCH: 34 pg (ref 26.0–34.0)
MCHC: 33.5 g/dL (ref 30.0–36.0)
MCV: 101.6 fL — AB (ref 78.0–100.0)
Platelets: 118 10*3/uL — ABNORMAL LOW (ref 150–400)
RBC: 2.47 MIL/uL — ABNORMAL LOW (ref 3.87–5.11)
RDW: 23.9 % — AB (ref 11.5–15.5)
WBC: 5 10*3/uL (ref 4.0–10.5)

## 2017-05-05 MED ORDER — FOLIC ACID 1 MG PO TABS: 1.0000 mg | ORAL_TABLET | Freq: Every day | ORAL | 0 refills | Status: DC

## 2017-05-05 MED ORDER — COLESTIPOL HCL 1 G PO TABS: 1.0000 g | ORAL_TABLET | Freq: Two times a day (BID) | ORAL | 0 refills | Status: DC

## 2017-05-05 MED ORDER — FAMOTIDINE 20 MG PO TABS: 20.0000 mg | ORAL_TABLET | Freq: Two times a day (BID) | ORAL | 0 refills | Status: DC

## 2017-05-05 MED ORDER — DIPHENOXYLATE-ATROPINE 2.5-0.025 MG PO TABS: 1.0000 | ORAL_TABLET | Freq: Four times a day (QID) | ORAL | 0 refills | Status: DC | PRN

## 2017-05-05 MED ORDER — BOOST / RESOURCE BREEZE PO LIQD: 1.0000 | Freq: Three times a day (TID) | ORAL | 0 refills | Status: DC

## 2017-05-05 MED ORDER — SACCHAROMYCES BOULARDII 250 MG PO CAPS: 250.0000 mg | ORAL_CAPSULE | ORAL | 0 refills | Status: DC

## 2017-05-05 MED ORDER — NICOTINE 14 MG/24HR TD PT24: 14.0000 mg | MEDICATED_PATCH | Freq: Every day | TRANSDERMAL | 0 refills | Status: DC

## 2017-05-05 NOTE — Discharge Summary (Addendum)
Physician Discharge Summary  Jamie Allen IFO:[id number] DOB: 31-Oct-1970 DOA: 04/28/2017  PCP: Boykin Nearing, MD  Admit date: 04/28/2017 Discharge date: 05/05/2017  Time spent: 45 minutes  Recommendations for Outpatient Follow-up:  Patient will be discharged to home.  Patient will need to follow up with primary care provider within one week of discharge, repeat CBC and CMP.  Follow up with gastroenterology as scheduled. Patient should continue medications as prescribed.  Patient should follow a soft diet. Advised smoking and alcohol cessation.   Discharge Diagnoses:  Colitis/ Chronic Abdominal distention Urinary tract infection Acute on chronic macrocytic Anemia Abdominal pain, nausea and vomiting diarrhea Recent diagnosis of [facility] spotted fever NASH Thrombocytopenia Alcohol abuse Volume overload  Discharge Condition: Stable  Diet recommendation: soft  Filed Weights   05/03/17 0638 05/04/17 0607 05/05/17 0300  Weight: 66.5 kg (146 lb 9.6 oz) 65.4 kg (144 lb 1.6 oz) 63.8 kg (140 lb 9.6 oz)    History of present illness:  on 04/29/2017 by Dr. Jennette Kettle Jamie Allen Jamie Allen a 46 y.o.femalewith medical history significant of C.Diff diarrhea, chronic cholecystitis s/p lap chole 3/8. 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 Finished course of doxycycline. Patient developed severe generalized abd pain, nausea, vomiting, diarrhea. Onset yesterday, worsening. Nothing makes better or worse. Diarrhea smells like C.Diff she says.  Hospital Course:  Colitis/ Chronic Abdominal distention -CT of abdomen showed extensive colitis, favor infection. Cirrhosis, steatosis, hepatomegaly -GI consulted and appreciated -CDiff negative -Was initially placed on vanc/flagyl- discontinued by Gastroenterology -Status post colonoscopy: Mild erythematous mucosa without hepatic flexure and descending colon. Enteric: With mild edema and mild friability  without ulceration or erosion. Not consistent with IBD, biopsies taken.internal hemorrhoids -Gastroenterology recommended continuing floor store, adding Lomotil 4 times a day when necessary for diarrhea and awaiting pathology results.  -Biopsy results Were unremarkable. Biopsies showed essentially unremarkable colorectal type mucosa, no evidence of idiopathic IBD, dysplasia or malignancy -Not sure if abdominal distention related to the colitis.  -Abd Xray on 7/12: Nonobstructive gas bowel pattern -patient will follow up with GI.  -GI added colestid and recommended restarting Bentyl PRN at discharge  Urinary tract infection -Urine culture >100K Klebsiella pneumoniae -Initially placed on IV ceftriaxone, transition to Ceftin -Completed course of antibiotics during hospitalization  Acute on chronic macrocytic Anemia -Patient received 2 units PRBCS this hospitalization -hemoglobin currently 8.4 -Repeat CBC in one week   Abdominal pain, nausea and vomiting diarrhea -Plan and treatment as above -Continue pain control and antiemetics as needed  Recent diagnosis of [place] spotted fever -Treated with doxycycline  NASH -Noted to have cirrhosis on CT abdomen -Mildly elevated INR -Supposed to have biopsy as an outpatient -Repeat CMP in one week  Thrombocytopenia -Secondary to liver disease -Platelets improving,currently 118  Alcohol abuse -Does not appear to be in withdrawal, continue to monitor closely  Volume overload -Patient states she feels somewhat swollen and continues to have abd distenstion -Gave lasix on 05/03/17, with little yield in urine output -Echocardiogram EF of 5055%, no regional wall motion abnormalities. Systolic pulmonary artery pressure moderately increased, 52 mmHg  Consultants Gastroenterology  Procedures  Colonoscopy colonoscopy  Discharge Exam: Vitals:   05/04/17 1945 05/05/17 0300  BP: (!) 109/59 96/63  Pulse: 97 99  Resp: 18 18    Temp: 98.5 F (36.9 C) 98.3 F (36.8 C)   Patient continues to have some abdominal distention, but feels it has improved mildly. Currently denies chest pain, Shortness of breath, nausea, vomiting, dizziness,  or headache. Feels she is ready to go home.   General: Well developed, no apparent distress  HEENT: NCAT, mucous membranes moist.  Cardiovascular: S1 S2 auscultated, no murmurs, RRR  Respiratory: Clear to auscultation bilaterally with equal chest rise, no wheezing  Abdomen: Soft, nontender, mildly distended, + bowel sounds  Extremities: warm dry without cyanosis clubbing. RLE edema  Neuro: AAOx3, nonfocal  Psych: Appropriate, pleasant  Discharge Instructions Discharge Instructions    Discharge instructions    Complete by:  As directed    Patient will be discharged to home.  Patient will need to follow up with primary care provider within one week of discharge, repeat CBC and CMP.  Follow up with gastroenterology as scheduled. Patient should continue medications as prescribed.  Patient should follow a soft diet. Advised smoking and alcohol cessation.     Current Discharge Medication List    START taking these medications   Details  colestipol (COLESTID) 1 g tablet Take 1 tablet (1 g total) by mouth 2 (two) times daily. Qty: 60 tablet, Refills: 0    diphenoxylate-atropine (LOMOTIL) 2.5-0.025 MG tablet Take 1 tablet by mouth 4 (four) times daily as needed for diarrhea or loose stools. Qty: 30 tablet, Refills: 0    famotidine (PEPCID) 20 MG tablet Take 1 tablet (20 mg total) by mouth 2 (two) times daily. Qty: 60 tablet, Refills: 0    feeding supplement (BOOST / RESOURCE BREEZE) LIQD Take 1 Container by mouth 3 (three) times daily between meals. Qty: 90 Container, Refills: 0    folic acid (FOLVITE) 1 MG tablet Take 1 tablet (1 mg total) by mouth daily. Qty: 30 tablet, Refills: 0    nicotine (NICODERM CQ - DOSED IN MG/24 HOURS) 14 mg/24hr patch Place 1 patch (14 mg  total) onto the skin daily. Qty: 28 patch, Refills: 0      CONTINUE these medications which have CHANGED   Details  saccharomyces boulardii (FLORASTOR) 250 MG capsule Take 1 capsule (250 mg total) by mouth every other day. Qty: 30 capsule, Refills: 0      CONTINUE these medications which have NOT CHANGED   Details  diazepam (VALIUM) 5 MG tablet Take 1 tablet (5 mg total) by mouth every 12 (twelve) hours as needed for anxiety. Qty: 60 tablet, Refills: 0   Associated Diagnoses: Neuropathic pain, leg, bilateral    gabapentin (NEURONTIN) 600 MG tablet Take 600 mg by mouth at bedtime.    levothyroxine (SYNTHROID, LEVOTHROID) 75 MCG tablet Take 1 tablet (75 mcg total) by mouth daily before breakfast. Qty: 30 tablet, Refills: 2   Associated Diagnoses: Hypothyroidism due to acquired atrophy of thyroid    magnesium chloride (SLOW-MAG) 64 MG TBEC SR tablet Take 1 tablet (64 mg total) by mouth 2 (two) times daily. Qty: 60 tablet, Refills: 0   Associated Diagnoses: Hypokalemia    ondansetron (ZOFRAN) 4 MG tablet TAKE 1 TABLET BY MOUTH EVERY 8 HOURS AS NEEDED FOR NAUSEA OR VOMITING. Qty: 20 tablet, Refills: 0   Associated Diagnoses: IBS (irritable bowel syndrome)    potassium chloride SA (K-DUR,KLOR-CON) 20 MEQ tablet Take 2 tablets (40 mEq total) by mouth 2 (two) times daily. Qty: 30 tablet, Refills: 0   Associated Diagnoses: Hypokalemia    pregabalin (LYRICA) 50 MG capsule Take 1 capsule (50 mg total) by mouth 3 (three) times daily. For PASS Qty: 270 capsule, Refills: 3   Associated Diagnoses: Neuropathic pain, leg, bilateral    promethazine (PHENERGAN) 25 MG tablet Take 1 tablet (25  mg total) by mouth every 8 (eight) hours as needed for nausea or vomiting. Qty: 45 tablet, Refills: 2   Associated Diagnoses: Nausea    metoCLOPramide (REGLAN) 10 MG tablet Take 1 tablet (10 mg total) by mouth every 6 (six) hours. Qty: 30 tablet, Refills: 0   Associated Diagnoses: [place] spotted  fever       Allergies  Allergen Reactions  . Iohexol Hives, Itching and Swelling  . Morphine And Related Other (See Comments)    Unknown, patient is unaware of allergy Can tolerate with Benadryl   Follow-up Information    Armbruster, Renelda Loma, MD Follow up on 06/23/2017.   Specialty:  Gastroenterology Why:  9:30 AM for follow up with GI doctor.  Contact information: [address] [place] [address] [phone]        Boykin Nearing, MD. Schedule an appointment as soon as possible for a visit in 1 week(s).   Specialty:  Family Medicine Why:  Hospital follow up Contact information: [place] [address] [phone]            The results of significant diagnostics from this hospitalization (including imaging, microbiology, ancillary and laboratory) are listed below for reference.    Significant Diagnostic Studies: Ct Abdomen Pelvis Wo Contrast  Result Date: 04/28/2017 CLINICAL DATA:  Vomiting and right lower quadrant pain. Cholecystectomy. IV contrast allergy. Nonalcoholic steatohepatitis. History of colitis in 2015. EXAM: CT ABDOMEN AND PELVIS WITHOUT CONTRAST TECHNIQUE: Multidetector CT imaging of the abdomen and pelvis was performed following the standard protocol without IV contrast. COMPARISON:  01/11/2017 FINDINGS: Lower chest: Bibasilar subsegmental atelectasis. Normal heart size without pericardial or pleural effusion. Contrast in the lower esophagus. Hepatobiliary: Moderate hepatic steatosis and moderate cirrhosis. Hepatomegaly at 19.2 cm Cholecystectomy. Resolved fluid collection in the operative bed. No biliary duct dilatation. Pancreas: Equivocal peripancreatic edema, including image 32/series 3. No fluid collection or pancreatic duct dilatation. Spleen: Splenomegaly at 14.5 cm craniocaudal. Adrenals/Urinary Tract: Normal adrenal glands. Moderate left renal cortical thinning. No renal calculi or hydronephrosis. No hydroureter or  ureteric calculi. No bladder calculi. Stomach/Bowel: Normal stomach, without wall thickening. Moderate colonic wall thickening from approximately the descending/sigmoid junction proximally. Relatively featureless colon. Pericolonic edema is most apparent surrounding the ascending segment. Normal terminal ileum. Normal small bowel. Vascular/Lymphatic: Aortic and branch vessel atherosclerosis. No abdominopelvic adenopathy. Reproductive: Normal uterus and adnexa. Other: No significant free fluid. Musculoskeletal: No acute osseous abnormality. IMPRESSION: 1. Extensive colitis. Favor infection. Exclude Celsius difficile clinically. Relative featureless colon can be seen with ulcerative colitis but could be the sequelae of repeated episodes of infectious colitis. 2. Cirrhosis, steatosis, and hepatomegaly. 3. Left renal scarring/atrophy. 4. Cannot exclude mild peripancreatic edema. Consider correlation with pancreatic enzymes. 5. Esophageal air fluid level suggests dysmotility or gastroesophageal reflux. 6.  Aortic Atherosclerosis (ICD10-I70.0). Electronically Signed   By: Abigail Miyamoto M.D.   On: 04/28/2017 20:01   US Abdomen Complete  Result Date: 05/02/2017 CLINICAL DATA:  Abdominal distention EXAM: ABDOMEN ULTRASOUND COMPLETE COMPARISON:  CT 04/28/2017 FINDINGS: Gallbladder: Prior cholecystectomy Common bile duct: Diameter: Normal caliber, 6 mm Liver: Increased echotexture. Mildly nodular contours of the liver compatible with cirrhosis. No focal abnormality. IVC: No abnormality visualized. Pancreas: Visualized portion unremarkable. Spleen: Enlarged with a craniocaudal length of 15.7 cm and a splenic volume of 866 mL Right Kidney: Length: 12.8 cm. Echogenicity within normal limits. No mass or hydronephrosis visualized. Left Kidney: Length: 9.7 cm scarring in the upper pole with cortical thinning.  No hydronephrosis. Abdominal aorta: No aneurysm visualized. Other findings: Perihepatic ascites. Bilateral pleural  effusions noted. IMPRESSION: Findings compatible with hepatic cirrhosis with associated ascites and splenomegaly. Small bilateral effusions. Electronically Signed   By: Rolm Baptise M.D.   On: 05/02/2017 17:22   US Pelvis Limited  Result Date: 04/17/2017 CLINICAL DATA:  Urinary tension for 8 months. EXAM: LIMITED ULTRASOUND OF PELVIS TECHNIQUE: Limited transabdominal ultrasound examination of the pelvis was performed. COMPARISON:  None. FINDINGS: The urinary bladder has a normal appearance for the degree of distention. Pre-void volume:  262 ml Post-void volume:  224 ml Other findings:  None. IMPRESSION: Large postvoid residual. Electronically Signed   By: Franki Cabot M.D.   On: 04/17/2017 16:42   Dg Abd Portable 1v  Result Date: 04/30/2017 CLINICAL DATA:  Nausea, vomiting, diarrhea. EXAM: PORTABLE ABDOMEN - 1 VIEW COMPARISON:  Abdominal CT 2 days prior. FINDINGS: No dilated bowel loops to suggest obstruction. Air-filled transverse colon on prior CT is not currently as prominent. No evidence of free air. Cholecystectomy clips in the right upper quadrant. Osseous structures are intact. IMPRESSION: Nonobstructive bowel gas pattern.  No free air. Electronically Signed   By: Jeb Levering M.D.   On: 04/30/2017 23:39   Ir Abdomen US Limited  Result Date: 05/04/2017 CLINICAL DATA:  46 year old female with cirrhosis and possible ascites EXAM: LIMITED ABDOMEN ULTRASOUND FOR ASCITES TECHNIQUE: Limited ultrasound survey for ascites was performed in all four abdominal quadrants. COMPARISON:  Abdominal ultrasound 05/02/2017 FINDINGS: Interrogation of all 4 quadrants of the abdomen demonstrates no residual ascites suitable for drainage. IMPRESSION: Interval resolution of trace abdominal ascites seen on the prior ultrasound. There is no ascites for paracentesis at this time. Electronically Signed   By: Jacqulynn Cadet M.D.   On: 05/04/2017 16:05    Microbiology: Recent Results (from the past 240 hour(s))  C  difficile quick scan w PCR reflex     Status: None   Collection Time: 04/28/17 11:15 PM  Result Value Ref Range Status   C Diff antigen NEGATIVE NEGATIVE Final   C Diff toxin NEGATIVE NEGATIVE Final   C Diff interpretation No C. difficile detected.  Final  Culture, Urine     Status: Abnormal   Collection Time: 04/29/17  5:19 AM  Result Value Ref Range Status   Specimen Description URINE, RANDOM  Final   Special Requests NONE  Final   Culture >=100,000 COLONIES/mL KLEBSIELLA PNEUMONIAE (A)  Final   Report Status 05/01/2017 FINAL  Final   Organism ID, Bacteria KLEBSIELLA PNEUMONIAE (A)  Final      Susceptibility   Klebsiella pneumoniae - MIC*    AMPICILLIN >=32 RESISTANT Resistant     CEFAZOLIN <=4 SENSITIVE Sensitive     CEFTRIAXONE <=1 SENSITIVE Sensitive     CIPROFLOXACIN <=0.25 SENSITIVE Sensitive     GENTAMICIN <=1 SENSITIVE Sensitive     IMIPENEM <=0.25 SENSITIVE Sensitive     NITROFURANTOIN 128 RESISTANT Resistant     TRIMETH/SULFA <=20 SENSITIVE Sensitive     AMPICILLIN/SULBACTAM >=32 RESISTANT Resistant     PIP/TAZO 64 INTERMEDIATE Intermediate     Extended ESBL NEGATIVE Sensitive     * >=100,000 COLONIES/mL KLEBSIELLA PNEUMONIAE  Gastrointestinal Panel by PCR , Stool     Status: None   Collection Time: 04/29/17  5:20 AM  Result Value Ref Range Status   Campylobacter species NOT DETECTED NOT DETECTED Final   Plesimonas shigelloides NOT DETECTED NOT DETECTED Final   Salmonella species NOT DETECTED NOT DETECTED Final  Yersinia enterocolitica NOT DETECTED NOT DETECTED Final   Vibrio species NOT DETECTED NOT DETECTED Final   Vibrio cholerae NOT DETECTED NOT DETECTED Final   Enteroaggregative E coli (EAEC) NOT DETECTED NOT DETECTED Final   Enteropathogenic E coli (EPEC) NOT DETECTED NOT DETECTED Final   Enterotoxigenic E coli (ETEC) NOT DETECTED NOT DETECTED Final   Shiga like toxin producing E coli (STEC) NOT DETECTED NOT DETECTED Final   Shigella/Enteroinvasive E coli  (EIEC) NOT DETECTED NOT DETECTED Final   Cryptosporidium NOT DETECTED NOT DETECTED Final   Cyclospora cayetanensis NOT DETECTED NOT DETECTED Final   Entamoeba histolytica NOT DETECTED NOT DETECTED Final   Giardia lamblia NOT DETECTED NOT DETECTED Final   Adenovirus F40/41 NOT DETECTED NOT DETECTED Final   Astrovirus NOT DETECTED NOT DETECTED Final   Norovirus GI/GII NOT DETECTED NOT DETECTED Final   Rotavirus A NOT DETECTED NOT DETECTED Final   Sapovirus (I, II, IV, and V) NOT DETECTED NOT DETECTED Final     Labs: Basic Metabolic Panel:  Recent Labs Lab 04/30/17 0425 05/02/17 0516 05/03/17 0428 05/04/17 0307 05/05/17 0200  NA 136 137 136 136 138  K 4.3 3.6 4.3 4.1 4.1  CL 112* 112* 114* 112* 110  CO2 17* 16* 17* 16* 19*  GLUCOSE 75 75 72 69 105*  BUN <5* <5* <5* <5* <5*  CREATININE 0.97 0.77 0.90 0.75 0.71  CALCIUM 7.1* 7.1* 7.0* 7.5* 8.3*   Liver Function Tests:  Recent Labs Lab 04/28/17 1518 04/30/17 0425 05/02/17 0516 05/03/17 0428 05/05/17 0200  AST 75* 59* 44* 46* 50*  ALT 22 18 15 14 14   ALKPHOS 174* 158* 145* 134* 143*  BILITOT 2.5* 1.6* 1.4* 1.4* 3.7*  PROT 7.3 5.9* 6.2* 6.2* 6.2*  ALBUMIN 3.2* 2.9* 2.9* 2.8* 2.8*    Recent Labs Lab 04/28/17 1518  LIPASE 23   No results for input(s): AMMONIA in the last 168 hours. CBC:  Recent Labs Lab 04/30/17 0425 05/02/17 0516 05/03/17 0428 05/04/17 0307 05/04/17 1811 05/05/17 0200  WBC 3.9* 3.7* 3.9* 4.2  --  5.0  HGB 8.0* 8.0* 7.6* 7.2* 8.6* 8.4*  HCT 24.2* 24.7* 22.8* 21.5* 26.2* 25.1*  MCV 102.5* 104.2* 104.6* 103.9*  --  101.6*  PLT 99* 101* 97* 101*  --  118*   Cardiac Enzymes: No results for input(s): CKTOTAL, CKMB, CKMBINDEX, TROPONINI in the last 168 hours. BNP: BNP (last 3 results)  Recent Labs  07/23/16 1439 01/02/17 2022  BNP 50.2 84.0    ProBNP (last 3 results) No results for input(s): PROBNP in the last 8760 hours.  CBG: No results for input(s): GLUCAP in the last 168  hours.     SignedCristal Ford  [facility] 05/05/2017, 2:07 PM

## 2017-05-05 NOTE — Telephone Encounter (Signed)
Recall in epic.

## 2017-05-05 NOTE — Discharge Instructions (Signed)
Abdominal Bloating When you have abdominal bloating, your abdomen may feel full, tight, or painful. It may also look bigger than normal or swollen (distended). Common causes of abdominal bloating include:  Swallowing air.  Constipation.  Problems digesting food.  Eating too much.  Irritable bowel syndrome. This is a condition that affects the large intestine.  Lactose intolerance. This is an inability to digest lactose, a natural sugar in dairy products.  Celiac disease. This is a condition that affects the ability to digest gluten, a protein found in some grains.  Gastroparesis. This is a condition that slows down the movement of food in the stomach and small intestine. It is more common in people with diabetes mellitus.  Gastroesophageal reflux disease (GERD). This is a digestive condition that makes stomach acid flow back into the esophagus.  Urinary retention. This means that the body is holding onto urine, and the bladder cannot be emptied all the way.  Follow these instructions at home: Eating and drinking  Avoid eating too much.  Try not to swallow air while talking or eating.  Avoid eating while lying down.  Avoid these foods and drinks: ? Foods that cause gas, such as broccoli, cabbage, cauliflower, and baked beans. ? Carbonated drinks. ? Hard candy. ? Chewing gum. Medicines  Take over-the-counter and prescription medicines only as told by your health care provider.  Take probiotic medicines. These medicines contain live bacteria or yeasts that can help digestion.  Take coated peppermint oil capsules. Activity  Try to exercise regularly. Exercise may help to relieve bloating that is caused by gas and relieve constipation. General instructions  Keep all follow-up visits as told by your health care provider. This is important. Contact a health care provider if:  You have nausea and vomiting.  You have diarrhea.  You have abdominal pain.  You have  unusual weight loss or weight gain.  You have severe pain, and medicines do not help. Get help right away if:  You have severe chest pain.  You have trouble breathing.  You have shortness of breath.  You have trouble urinating.  You have darker urine than normal.  You have blood in your stools or have dark, tarry stools. Summary  Abdominal bloating means that the abdomen is swollen.  Common causes of abdominal bloating are swallowing air, constipation, and problems digesting food.  Avoid eating too much and avoid swallowing air.  Avoid foods that cause gas, carbonated drinks, hard candy, and chewing gum. This information is not intended to replace advice given to you by your health care provider. Make sure you discuss any questions you have with your health care provider. Document Released: 11/07/2016 Document Revised: 11/07/2016 Document Reviewed: 11/07/2016 Elsevier Interactive Patient Education  [employer].

## 2017-05-05 NOTE — Progress Notes (Signed)
Pt has orders to be discharged. Discharge instructions given and pt has no additional questions at this time. Medication regimen reviewed and pt educated. Pt verbalized understanding and has no additional questions. Telemetry box removed. IV removed and site in good condition. Pt stable and waiting for transportation. 

## 2017-05-05 NOTE — Telephone Encounter (Signed)
-----   Message from Jerene Bears, MD sent at 05/05/2017  1:04 PM EDT ----- Regarding: RE: Recall Yes, for Armbruster.  She is his patient JMP  ----- Message ----- From: Algernon Huxley, RN Sent: 05/05/2017  12:23 PM To: Jerene Bears, MD Subject: Recall                                         Did you want a 10 year colon recall on this pt?

## 2017-05-05 NOTE — Progress Notes (Signed)
*  PRELIMINARY RESULTS* Echocardiogram 2D Echocardiogram has been performed.  Samuel Germany 05/05/2017, 1:12 PM

## 2017-05-05 NOTE — Progress Notes (Signed)
Nutrition Follow-up  DOCUMENTATION CODES:   Non-severe (moderate) malnutrition in context of chronic illness  INTERVENTION:   -Continue Boost Breeze po TID, each supplement provides 250 kcal and 9 grams of protein  -Provided "Diarrhea Nutrition Therapy" handout from [employer]. Provided verbal education as well; pt receptive  -Pt would likely benefit from smaller, more frequent meals; discussed with pt, continue at discharge .   NUTRITION DIAGNOSIS:   Malnutrition (Moderate) related to chronic illness (chronic diarrhea, NASH, IBS) as evidenced by percent weight loss, mild depletion of body fat, mild depletion of muscle mass.  Being addressed as diet advanced, pt tolerating with po intake 50-100%, drinking Boost Breeze  GOAL:   Patient will meet greater than or equal to 90% of their needs  Progressing  MONITOR:   PO intake, Supplement acceptance, Labs, Weight trends  REASON FOR ASSESSMENT:   Malnutrition Screening Tool    ASSESSMENT:   46 yo female with complaints of severe abdominal pain, N/V/D admitted with diffuse colitis . Pt with hx of chronic cholecystitis s/p lap cholecystectomy 12/2016, gastritis, C.diff colitis, NASH, IBS  Diet advanced to Soft; tolerating with recorded po intake 50-100% of meals. Drinking Boost Breeze as well  Pt complains of early satiety, abdomen is distended. Noted US abdomen +ascites  Pt reports diarrhea still present, started on colestid. Pt hoping this med helps  Labs: reviewed Meds: colestid, folic acid, slow-mag, KCl, florastor  Diet Order:  DIET SOFT Room service appropriate? Yes; Fluid consistency: Thin  Skin:  Reviewed, no issues  Last BM:  05/04/17  Height:   Ht Readings from Last 1 Encounters:  05/01/17 5\' 1"  (1.549 m)    Weight:   Wt Readings from Last 1 Encounters:  05/05/17 140 lb 9.6 oz (63.8 kg)    Ideal Body Weight:     BMI:  Body mass index is 26.57 kg/m.  Estimated Nutritional  Needs:   Kcal:  1650-1850 kcals  Protein:  90-90 g  Fluid:  >/= 2 L  EDUCATION NEEDS:   No education needs identified at this time  [place], [place], LDN [phone] Pager  [phone] Weekend/On-Call Pager

## 2017-05-05 NOTE — Progress Notes (Signed)
Daily Rounding Note  05/05/2017, 8:09 AM  LOS: 6 days   SUBJECTIVE:   Chief complaint:     Note use of Dilaudid, 1 mg yesterday, 2 mg two days ago: decelerating use.  Abdominal discomfort mostly post prandial: she eats, gets bloated and belly feels uncomfortable.  Diarrhea improved with restart Colestid. Right ankle painful at lateral malleolus and swollen.    OBJECTIVE:         Vital signs in last 24 hours:    Temp:  [98.3 F (36.8 C)-98.8 F (37.1 C)] 98.3 F (36.8 C) (07/17 0300) Pulse Rate:  [91-99] 99 (07/17 0300) Resp:  [18] 18 (07/17 0300) BP: (96-128)/(39-65) 96/63 (07/17 0300) SpO2:  [95 %-100 %] 97 % (07/17 0300) Weight:  [63.8 kg (140 lb 9.6 oz)] 63.8 kg (140 lb 9.6 oz) (07/17 0300) Last BM Date: 05/04/17 Filed Weights   05/03/17 0638 05/04/17 0607 05/05/17 0300  Weight: 66.5 kg (146 lb 9.6 oz) 65.4 kg (144 lb 1.6 oz) 63.8 kg (140 lb 9.6 oz)   General: pleasant, alert, comfortable.  Looks somewhat chronically ill but not acutely ill.    Heart: RRR Chest: clear bil.  No cough or dyspnea.  Husky smokers voice Abdomen: soft, slight distention (improved c/w yesterday).  Active BS  Extremities: ankle swelling on right but no redness, minor tenderness to palpation, scar present at lateral ankle.  Neuro/Psych:  Pleasant, in good spirits.  No confusion or somnolence.   Intake/Output from previous day: 07/16 0701 - 07/17 0700 In: 1655 [P.O.:840; Blood:815] Out: 800 [Urine:800]  Intake/Output this shift: No intake/output data recorded.  Lab Results:  Recent Labs  05/03/17 0428 05/04/17 0307 05/04/17 1811 05/05/17 0200  WBC 3.9* 4.2  --  5.0  HGB 7.6* 7.2* 8.6* 8.4*  HCT 22.8* 21.5* 26.2* 25.1*  PLT 97* 101*  --  118*   BMET  Recent Labs  05/03/17 0428 05/04/17 0307 05/05/17 0200  NA 136 136 138  K 4.3 4.1 4.1  CL 114* 112* 110  CO2 17* 16* 19*  GLUCOSE 72 69 105*  BUN <5* <5* <5*    CREATININE 0.90 0.75 0.71  CALCIUM 7.0* 7.5* 8.3*   LFT  Recent Labs  05/03/17 0428 05/05/17 0200  PROT 6.2* 6.2*  ALBUMIN 2.8* 2.8*  AST 46* 50*  ALT 14 14  ALKPHOS 134* 143*  BILITOT 1.4* 3.7*   PT/INR No results for input(s): LABPROT, INR in the last 72 hours. Hepatitis Panel No results for input(s): HEPBSAG, HCVAB, HEPAIGM, HEPBIGM in the last 72 hours.  Studies/Results: Ir Abdomen US Limited  Result Date: 05/04/2017 CLINICAL DATA:  46 year old female with cirrhosis and possible ascites EXAM: LIMITED ABDOMEN ULTRASOUND FOR ASCITES TECHNIQUE: Limited ultrasound survey for ascites was performed in all four abdominal quadrants. COMPARISON:  Abdominal ultrasound 05/02/2017 FINDINGS: Interrogation of all 4 quadrants of the abdomen demonstrates no residual ascites suitable for drainage. IMPRESSION: Interval resolution of trace abdominal ascites seen on the prior ultrasound. There is no ascites for paracentesis at this time. Electronically Signed   By: Jacqulynn Cadet M.D.   On: 05/04/2017 16:05   Scheduled Meds: . colestipol  1 g Oral BID  . famotidine  20 mg Oral BID  . feeding supplement  1 Container Oral TID BM  . folic acid  1 mg Oral Daily  . gabapentin  600 mg Oral QHS  . levothyroxine  75 mcg Oral QAC breakfast  . magnesium chloride  1 tablet Oral BID  . nicotine  14 mg Transdermal Daily  . potassium chloride SA  40 mEq Oral BID  . pregabalin  50 mg Oral TID  . saccharomyces boulardii  250 mg Oral QODAY   Continuous Infusions: PRN Meds:.acetaminophen **OR** acetaminophen, diazepam, diphenoxylate-atropine, HYDROmorphone (DILAUDID) injection, ondansetron **OR** ondansetron (ZOFRAN) IV, promethazine, promethazine, simethicone   ASSESMENT:   *  Acute on chronic abdominal pain, non-bloody n/v.  Extensive GI evaluation over the last several months including EGD (02/12/17: Erythematous gastric mucosa no significant abnormality on pathology), HIDA scan (normal liver  uptake, probably small bowel visualization, patent CVD, no bile leak postcholecystectomy), MRCP ( 12/25/16. Hepatic steatosis, hepatomegaly. No gallstones, no cholecystitis. No biliary ductal dilatation. No choledocholithiasis. Spleen upper limits normal in size).  Status post laparoscopic cholecystectomy 12/25/16.   *  Ascites, resolved.  Nothing to tap.    *  Cirrhosis.  Not abstinent.    *  Chronic diarrhea post cholecystectomy, Colestid (effective PTA) restarted.  On Florastor for 2015 hx C diff.  Currently C diff and path panel negative. Pancolitis on CT scan 04/28/17. 05/01/17 colonoscopy: scattered nonspecific erythematous mucosa with contact erythema mucosa but no ulceration/eroisions.  Also internal hemorrhoids.  Pathology: unremarkable colorectal mucosa. No IBD, dysplasia, malignancy.  A few lymphoid aggregates which is a nonspecific finding.     PLAN   *  Restart Bentyl prn at discharge.  Folic acid 1 mg daily.  Would not Rx any narcotics.  Has GI OV set for 9/4.  Needs total ETOH abstinence.  Suggest AA participation.  Apparently her neighbor has begun going to [place] so pt open to idea.  Also says she will throw away her cigs, wants to quit smoking.     Jamie Allen  05/05/2017, 8:09 AM Pager: [phone]

## 2017-05-06 MED FILL — FOLIC ACID 1 MG TABLET: 1 | 30 days supply | Qty: 30 | Fill #0

## 2017-05-06 MED FILL — COLESTIPOL HCL 1 GM TABLET: 1 | 5 days supply | Qty: 10 | Fill #0 | Status: TO

## 2017-05-06 MED FILL — FAMOTIDINE 20 MG TABLET: 20 | 30 days supply | Qty: 60 | Fill #0

## 2017-05-07 LAB — BPAM RBC
Blood Product Expiration Date: 201807242359
Blood Product Expiration Date: 201807252359
ISSUE DATE / TIME: 201807101632
ISSUE DATE / TIME: 201807161051
UNIT TYPE AND RH: 9500
Unit Type and Rh: 9500

## 2017-05-07 LAB — TYPE AND SCREEN
ABO/RH(D): O NEG
ANTIBODY SCREEN: POSITIVE
DAT, IGG: NEGATIVE
Donor AG Type: NEGATIVE
UNIT DIVISION: 0
Unit division: 0

## 2017-05-07 MED FILL — COLESTIPOL HCL 1 GM TABLET: 1 | 30 days supply | Qty: 60 | Fill #0

## 2017-05-10 NOTE — Anesthesia Postprocedure Evaluation (Signed)
Anesthesia Post Note  Patient: Jamie Allen  Procedure(s) Performed: Procedure(s) (LRB): COLONOSCOPY (N/A)     Patient location during evaluation: Endoscopy Anesthesia Type: MAC Level of consciousness: awake and alert Pain management: pain level controlled Vital Signs Assessment: post-procedure vital signs reviewed and stable Respiratory status: spontaneous breathing, nonlabored ventilation, respiratory function stable and patient connected to nasal cannula oxygen Cardiovascular status: stable and blood pressure returned to baseline Anesthetic complications: no    Last Vitals:  Vitals:   05/04/17 1945 05/05/17 0300  BP: (!) 109/59 96/63  Pulse: 97 99  Resp: 18 18  Temp: 36.9 C 36.8 C    Last Pain:  Vitals:   05/05/17 1000  TempSrc:   PainSc: Asleep                 Murphy Duzan,JAMES TERRILL

## 2017-05-15 ENCOUNTER — Encounter: Payer: Self-pay | Admitting: Family Medicine

## 2017-05-15 ENCOUNTER — Ambulatory Visit: Payer: Self-pay | Attending: Family Medicine | Admitting: Family Medicine

## 2017-05-15 VITALS — BP 95/57 | HR 86 | Temp 98.3°F | Ht 61.0 in | Wt 128.6 lb

## 2017-05-15 DIAGNOSIS — R339 Retention of urine, unspecified: Secondary | ICD-10-CM

## 2017-05-15 DIAGNOSIS — F17210 Nicotine dependence, cigarettes, uncomplicated: Secondary | ICD-10-CM | POA: Insufficient documentation

## 2017-05-15 DIAGNOSIS — M79605 Pain in left leg: Secondary | ICD-10-CM | POA: Insufficient documentation

## 2017-05-15 DIAGNOSIS — G5793 Unspecified mononeuropathy of bilateral lower limbs: Secondary | ICD-10-CM

## 2017-05-15 DIAGNOSIS — M79604 Pain in right leg: Secondary | ICD-10-CM | POA: Insufficient documentation

## 2017-05-15 DIAGNOSIS — Z79899 Other long term (current) drug therapy: Secondary | ICD-10-CM | POA: Insufficient documentation

## 2017-05-15 DIAGNOSIS — E039 Hypothyroidism, unspecified: Secondary | ICD-10-CM | POA: Insufficient documentation

## 2017-05-15 DIAGNOSIS — F419 Anxiety disorder, unspecified: Secondary | ICD-10-CM

## 2017-05-15 DIAGNOSIS — G5791 Unspecified mononeuropathy of right lower limb: Secondary | ICD-10-CM

## 2017-05-15 DIAGNOSIS — A770 Spotted fever due to Rickettsia rickettsii: Secondary | ICD-10-CM

## 2017-05-15 DIAGNOSIS — G5792 Unspecified mononeuropathy of left lower limb: Secondary | ICD-10-CM

## 2017-05-15 DIAGNOSIS — F329 Major depressive disorder, single episode, unspecified: Secondary | ICD-10-CM

## 2017-05-15 DIAGNOSIS — K7031 Alcoholic cirrhosis of liver with ascites: Secondary | ICD-10-CM

## 2017-05-15 MED ORDER — DIAZEPAM 5 MG PO TABS: 5.0000 mg | ORAL_TABLET | Freq: Two times a day (BID) | ORAL | 0 refills | Status: DC | PRN

## 2017-05-15 MED FILL — diazePAM 5 MG TABS: 5 | 30 days supply | Qty: 60 | Fill #0

## 2017-05-15 NOTE — Progress Notes (Addendum)
Subjective:  Patient ID: Jamie Allen, female    DOB: 1971/04/09  Age: 46 y.o. MRN: [id number]  CC: Cirrhosis   HPI Jamie Allen has history of alcohol abuse, alcoholic cirrhosis,  neuropathic pain in legs, hypothyroidism she presents for    1. Hospital follow up for cirrhosis: she was hospitalized from 7/10-7/17/2018 for dizziness, lightheadedness, weakness, abdominal pain,.swelling in abdomen and legs. She was hypotensive on admission wit BP 81/46. After IV fluids she was found to have anemia with Hgb of 6.9. on 04/29/17.  She was transfused 2 U PRBCs. She underwent colonoscopy on 05/01/17  that revealed non specific erythematous mucosa at the hepatic flexure and ascending colon. Pathology results unremarkable. She had Klebsiella Pneumonia UTI she was treated with ceftriaxone initially then transitioned to Ceftin. She was given lasix for abdominal distension. ECHO was obtained to assess fluid overload, EF 50-55%, pulmonary artery pressure was moderately increased at 52 mm Hg. She has been advised to eat a low salt diet to prevent fluid overload.  She reports her last alcoholic drink was 2 months ago. She reports feeling better now than she has in several months.  She has follow up with cardiology and GI scheduled.     Social History  Substance Use Topics  . Smoking status: Current Some Day Smoker    Packs/day: 0.25    Years: 20.00    Types: Cigarettes  . Smokeless tobacco: Never Used     Comment: 4 cigs a day   . Alcohol use No     Comment: no etoh now- used to be 21 glasses wine a week, did 1 beer a day but not doing that now either     Outpatient Medications Prior to Visit  Medication Sig Dispense Refill  . colestipol (COLESTID) 1 g tablet Take 1 tablet (1 g total) by mouth 2 (two) times daily. 60 tablet 0  . diazepam (VALIUM) 5 MG tablet Take 1 tablet (5 mg total) by mouth every 12 (twelve) hours as needed for anxiety. 60 tablet 0  . diphenoxylate-atropine (LOMOTIL)  2.5-0.025 MG tablet Take 1 tablet by mouth 4 (four) times daily as needed for diarrhea or loose stools. 30 tablet 0  . famotidine (PEPCID) 20 MG tablet Take 1 tablet (20 mg total) by mouth 2 (two) times daily. 60 tablet 0  . feeding supplement (BOOST / RESOURCE BREEZE) LIQD Take 1 Container by mouth 3 (three) times daily between meals. 90 Container 0  . folic acid (FOLVITE) 1 MG tablet Take 1 tablet (1 mg total) by mouth daily. 30 tablet 0  . gabapentin (NEURONTIN) 600 MG tablet Take 600 mg by mouth at bedtime.    Marland Kitchen levothyroxine (SYNTHROID, LEVOTHROID) 75 MCG tablet Take 1 tablet (75 mcg total) by mouth daily before breakfast. 30 tablet 2  . magnesium chloride (SLOW-MAG) 64 MG TBEC SR tablet Take 1 tablet (64 mg total) by mouth 2 (two) times daily. 60 tablet 0  . nicotine (NICODERM CQ - DOSED IN MG/24 HOURS) 14 mg/24hr patch Place 1 patch (14 mg total) onto the skin daily. 28 patch 0  . ondansetron (ZOFRAN) 4 MG tablet TAKE 1 TABLET BY MOUTH EVERY 8 HOURS AS NEEDED FOR NAUSEA OR VOMITING. 20 tablet 0  . potassium chloride SA (K-DUR,KLOR-CON) 20 MEQ tablet Take 2 tablets (40 mEq total) by mouth 2 (two) times daily. 30 tablet 0  . pregabalin (LYRICA) 50 MG capsule Take 1 capsule (50 mg total) by mouth 3 (three) times daily. For  PASS 270 capsule 3  . promethazine (PHENERGAN) 25 MG tablet Take 1 tablet (25 mg total) by mouth every 8 (eight) hours as needed for nausea or vomiting. 45 tablet 2  . saccharomyces boulardii (FLORASTOR) 250 MG capsule Take 1 capsule (250 mg total) by mouth every other day. 30 capsule 0  . metoCLOPramide (REGLAN) 10 MG tablet Take 1 tablet (10 mg total) by mouth every 6 (six) hours. (Patient not taking: Reported on 05/15/2017) 30 tablet 0   No facility-administered medications prior to visit.     ROS Review of Systems  Constitutional: Negative for chills and fever.  Eyes: Negative for visual disturbance.  Respiratory: Negative for shortness of breath.   Cardiovascular:  Positive for chest pain.  Gastrointestinal: Negative for abdominal distention, abdominal pain, anal bleeding, blood in stool, constipation, diarrhea, nausea, rectal pain and vomiting.  Genitourinary: Positive for decreased urine volume.  Musculoskeletal: Positive for arthralgias (R ankle ) and myalgias. Negative for back pain.  Skin: Positive for rash.  Allergic/Immunologic: Negative for immunocompromised state.  Neurological: Positive for headaches.  Hematological: Negative for adenopathy. Does not bruise/bleed easily.  Psychiatric/Behavioral: Negative for dysphoric mood and suicidal ideas.    Objective:  BP (!) 95/57   Pulse 86   Temp 98.3 F (36.8 C) (Oral)   Ht 5' 1"  (1.549 m)   Wt 128 lb 9.6 oz (58.3 kg)   LMP 06/01/2014   SpO2 96%   BMI 24.30 kg/m   BP/Weight 05/15/2017 05/05/2017 7/40/8144  Systolic BP 95 96 99  Diastolic BP 57 63 63  Wt. (Lbs) 128.6 140.6 130.4  BMI 24.3 26.57 24.64  Some encounter information is confidential and restricted. Go to Review Flowsheets activity to see all data.    Physical Exam  Constitutional: She is oriented to person, place, and time. She appears well-developed and well-nourished. No distress.  HENT:  Head: Normocephalic and atraumatic.  Cardiovascular: Normal rate, regular rhythm, normal heart sounds and intact distal pulses.   Pulmonary/Chest: Effort normal and breath sounds normal.  Abdominal: Soft. Bowel sounds are normal. She exhibits distension (slight). She exhibits no mass. There is tenderness (mild lower abdominal ). There is no rebound and no guarding.  Musculoskeletal: She exhibits no edema.  Neurological: She is alert and oriented to person, place, and time.  Skin: Skin is warm and dry. No rash noted.  Psychiatric: She has a normal mood and affect.   Lab Results  Component Value Date   TSH 3.660 04/06/2017      Chemistry      Component Value Date/Time   NA 136 05/15/2017 1635   K 4.1 05/15/2017 1635   CL 101  05/15/2017 1635   CO2 19 (L) 05/15/2017 1635   BUN 6 05/15/2017 1635   CREATININE 0.86 05/15/2017 1635   CREATININE 1.01 05/19/2016 1639      Component Value Date/Time   CALCIUM 8.9 05/15/2017 1635   ALKPHOS 180 (H) 05/15/2017 1635   AST 93 (H) 05/15/2017 1635   ALT 21 05/15/2017 1635   BILITOT 1.8 (H) 05/15/2017 1635     Lab Results  Component Value Date   WBC 7.2 05/15/2017   HGB 9.3 (L) 05/15/2017   HCT 28.1 (L) 05/15/2017   MCV 102 (H) 05/15/2017   PLT 164 05/15/2017   Lab Results  Component Value Date   TSH 3.660 04/06/2017     Assessment & Plan:  Jamie Allen was seen today for cirrhosis.  Diagnoses and all orders for this visit:  Alcoholic  cirrhosis of liver with ascites (HCC) -     CBC -     CMP14+EGFR  Neuropathic pain, leg, bilateral  Anxiety and depression -     diazepam (VALIUM) 5 MG tablet; Take 1 tablet (5 mg total) by mouth every 12 (twelve) hours as needed for anxiety.  Urinary retention  [facility] spotted fever   Follow-up: Return in about 10 weeks (around 07/24/2017).   Boykin Nearing MD

## 2017-05-15 NOTE — Patient Instructions (Addendum)
  Diagnoses and all orders for this visit:  Alcoholic cirrhosis of liver with ascites (HCC) -     CBC -     CMP14+EGFR  Neuropathic pain, leg, bilateral  Anxiety and depression -     diazepam (VALIUM) 5 MG tablet; Take 1 tablet (5 mg total) by mouth every 12 (twelve) hours as needed for anxiety.   Smoking cessation support: smoking cessation hotline: 1-800-QUIT-NOW.  Smoking cessation classes are available through [facility]. Call [phone] or visit our website at [email or web address]  F/u in 10 weeks for cirrhosis, neuropathy, flu shot   Dr. Adrian Blackwater

## 2017-05-16 LAB — CMP14+EGFR
A/G RATIO: 1.1 — AB (ref 1.2–2.2)
ALT: 21 IU/L (ref 0–32)
AST: 93 IU/L — AB (ref 0–40)
Albumin: 3.8 g/dL (ref 3.5–5.5)
Alkaline Phosphatase: 180 IU/L — ABNORMAL HIGH (ref 39–117)
BILIRUBIN TOTAL: 1.8 mg/dL — AB (ref 0.0–1.2)
BUN/Creatinine Ratio: 7 — ABNORMAL LOW (ref 9–23)
BUN: 6 mg/dL (ref 6–24)
CHLORIDE: 101 mmol/L (ref 96–106)
CO2: 19 mmol/L — ABNORMAL LOW (ref 20–29)
Calcium: 8.9 mg/dL (ref 8.7–10.2)
Creatinine, Ser: 0.86 mg/dL (ref 0.57–1.00)
GFR calc Af Amer: 94 mL/min/{1.73_m2} (ref >59)
GFR calc non Af Amer: 81 mL/min/{1.73_m2} (ref >59)
Globulin, Total: 3.4 g/dL (ref 1.5–4.5)
Glucose: 88 mg/dL (ref 65–99)
POTASSIUM: 4.1 mmol/L (ref 3.5–5.2)
Sodium: 136 mmol/L (ref 134–144)
Total Protein: 7.2 g/dL (ref 6.0–8.5)

## 2017-05-16 LAB — CBC
Hematocrit: 28.1 % — ABNORMAL LOW (ref 34.0–46.6)
Hemoglobin: 9.3 g/dL — ABNORMAL LOW (ref 11.1–15.9)
MCH: 33.7 pg — AB (ref 26.6–33.0)
MCHC: 33.1 g/dL (ref 31.5–35.7)
MCV: 102 fL — AB (ref 79–97)
PLATELETS: 164 10*3/uL (ref 150–379)
RBC: 2.76 x10E6/uL — ABNORMAL LOW (ref 3.77–5.28)
RDW: 21.2 % — AB (ref 12.3–15.4)
WBC: 7.2 10*3/uL (ref 3.4–10.8)

## 2017-05-20 ENCOUNTER — Telehealth: Payer: Self-pay | Admitting: Family Medicine

## 2017-05-20 NOTE — Assessment & Plan Note (Signed)
Resolved during hospitalization Following catheterization

## 2017-05-20 NOTE — Assessment & Plan Note (Signed)
Refilled valium 5 mg BID prn

## 2017-05-20 NOTE — Telephone Encounter (Signed)
Patient called states she just missed phone call, pt would like to review results, please f/up

## 2017-05-20 NOTE — Assessment & Plan Note (Addendum)
Alcoholic cirrhosis No EOTH Patient will have close f/u with GI  He has improved thrombocytopenia Stable anemia

## 2017-05-20 NOTE — Assessment & Plan Note (Signed)
Treated with doxycycline Resolved

## 2017-05-21 NOTE — Addendum Note (Signed)
Addended by: Boykin Nearing on: 05/21/2017 01:43 PM   Modules accepted: Orders

## 2017-05-22 MED FILL — LEVOTHYROXINE 75 MCG TABLET: 75 | 30 days supply | Qty: 30 | Fill #1

## 2017-05-22 NOTE — Telephone Encounter (Signed)
Pt was called and informed of lab results. 

## 2017-05-26 ENCOUNTER — Telehealth: Payer: Self-pay | Admitting: Family Medicine

## 2017-05-26 DIAGNOSIS — D638 Anemia in other chronic diseases classified elsewhere: Secondary | ICD-10-CM

## 2017-05-26 DIAGNOSIS — K766 Portal hypertension: Principal | ICD-10-CM

## 2017-05-26 DIAGNOSIS — K3189 Other diseases of stomach and duodenum: Secondary | ICD-10-CM

## 2017-05-26 NOTE — Telephone Encounter (Signed)
Patient stated requested letter for Disability hearing/attorney at last office visit   Please follow up with patient. She would like to get letter before Dr. Adrian Blackwater leaves.

## 2017-05-27 NOTE — Telephone Encounter (Signed)
Will route to PCP 

## 2017-05-28 DIAGNOSIS — K3189 Other diseases of stomach and duodenum: Secondary | ICD-10-CM | POA: Insufficient documentation

## 2017-05-28 DIAGNOSIS — K766 Portal hypertension: Principal | ICD-10-CM

## 2017-05-28 DIAGNOSIS — D638 Anemia in other chronic diseases classified elsewhere: Secondary | ICD-10-CM | POA: Insufficient documentation

## 2017-05-28 NOTE — Telephone Encounter (Signed)
Please inform patient that disability letter written and ready to pick or can be mailed if she prefers

## 2017-05-28 NOTE — Assessment & Plan Note (Signed)
Portal hypertensive gastritis noted on 01/2017 EGD

## 2017-05-29 NOTE — Telephone Encounter (Signed)
Pt was called and a Vm was left informing pt that letter is ready for pick up.

## 2017-05-31 ENCOUNTER — Encounter (HOSPITAL_COMMUNITY): Payer: Self-pay | Admitting: Emergency Medicine

## 2017-05-31 DIAGNOSIS — R109 Unspecified abdominal pain: Secondary | ICD-10-CM | POA: Insufficient documentation

## 2017-05-31 DIAGNOSIS — R079 Chest pain, unspecified: Secondary | ICD-10-CM | POA: Insufficient documentation

## 2017-05-31 DIAGNOSIS — Z5321 Procedure and treatment not carried out due to patient leaving prior to being seen by health care provider: Secondary | ICD-10-CM | POA: Insufficient documentation

## 2017-05-31 LAB — CBC
HCT: 29.9 % — ABNORMAL LOW (ref 36.0–46.0)
Hemoglobin: 10.2 g/dL — ABNORMAL LOW (ref 12.0–15.0)
MCH: 34.1 pg — AB (ref 26.0–34.0)
MCHC: 34.1 g/dL (ref 30.0–36.0)
MCV: 100 fL (ref 78.0–100.0)
PLATELETS: 154 10*3/uL (ref 150–400)
RBC: 2.99 MIL/uL — AB (ref 3.87–5.11)
RDW: 18.5 % — ABNORMAL HIGH (ref 11.5–15.5)
WBC: 6.5 10*3/uL (ref 4.0–10.5)

## 2017-05-31 LAB — BASIC METABOLIC PANEL
Anion gap: 12 (ref 5–15)
CALCIUM: 9.7 mg/dL (ref 8.9–10.3)
CO2: 24 mmol/L (ref 22–32)
CREATININE: 0.62 mg/dL (ref 0.44–1.00)
Chloride: 106 mmol/L (ref 101–111)
GFR calc non Af Amer: >60 mL/min (ref >60)
GLUCOSE: 100 mg/dL — AB (ref 65–99)
Potassium: 3.1 mmol/L — ABNORMAL LOW (ref 3.5–5.1)
Sodium: 142 mmol/L (ref 135–145)

## 2017-05-31 LAB — URINALYSIS, ROUTINE W REFLEX MICROSCOPIC
BACTERIA UA: NONE SEEN
Bilirubin Urine: NEGATIVE
Glucose, UA: NEGATIVE mg/dL
Ketones, ur: NEGATIVE mg/dL
Leukocytes, UA: NEGATIVE
Nitrite: NEGATIVE
PROTEIN: NEGATIVE mg/dL
SPECIFIC GRAVITY, URINE: 1.001 — AB (ref 1.005–1.030)
pH: 7 (ref 5.0–8.0)

## 2017-05-31 LAB — LIPASE, BLOOD: Lipase: 38 U/L (ref 11–51)

## 2017-05-31 LAB — I-STAT TROPONIN, ED: TROPONIN I, POC: 0 ng/mL (ref 0.00–0.08)

## 2017-05-31 NOTE — ED Triage Notes (Addendum)
Patient with left sided chest pain and abdominal pain that started today.  No shortness of breath per patient.  Patient states that it started 3-4 hours ago, woke her from sleep.  No nausea or vomiting.  Patient given 324mg  ASA by EMS.

## 2017-06-01 ENCOUNTER — Emergency Department (HOSPITAL_COMMUNITY)
Admission: EM | Admit: 2017-06-01 | Discharge: 2017-06-01 | Disposition: A | Discharge status: Home / Self Care | Payer: Self-pay | Attending: Emergency Medicine | Admitting: Emergency Medicine

## 2017-06-01 NOTE — ED Notes (Signed)
Attempted to take vitals, Pt is not in waiting area.

## 2017-06-23 ENCOUNTER — Encounter: Payer: Self-pay | Admitting: Gastroenterology

## 2017-06-23 ENCOUNTER — Ambulatory Visit (INDEPENDENT_AMBULATORY_CARE_PROVIDER_SITE_OTHER): Payer: Self-pay | Admitting: Gastroenterology

## 2017-06-23 ENCOUNTER — Other Ambulatory Visit: Payer: Self-pay

## 2017-06-23 VITALS — BP 108/62 | HR 60 | Ht 61.0 in | Wt 128.0 lb

## 2017-06-23 DIAGNOSIS — K529 Noninfective gastroenteritis and colitis, unspecified: Secondary | ICD-10-CM

## 2017-06-23 DIAGNOSIS — K7460 Unspecified cirrhosis of liver: Secondary | ICD-10-CM

## 2017-06-23 DIAGNOSIS — Z23 Encounter for immunization: Secondary | ICD-10-CM

## 2017-06-23 MED ORDER — RIFAXIMIN 550 MG PO TABS: 550.0000 mg | ORAL_TABLET | Freq: Three times a day (TID) | ORAL | 0 refills | Status: DC

## 2017-06-23 MED ORDER — COLESTIPOL HCL 1 G PO TABS
1.0000 g | ORAL_TABLET | Freq: Two times a day (BID) | ORAL | 2 refills | Status: DC
Start: 2017-06-23 — End: 2017-09-20

## 2017-06-23 MED FILL — COLESTIPOL HCL 1 GM TABLET: 1 | 30 days supply | Qty: 60 | Fill #0
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
## 2017-06-23 NOTE — Patient Instructions (Signed)
Your physician has requested that you go to the basement for the following lab work before leaving today: Fecal elastase  We have sent your demographic information and a prescription for Xifaxan to [facility]. This pharmacy is able to get medication approved through insurance and get you the lowest copay possible. If you have not heard from them within 1 week, please call our office at [phone] to let us know.  Please purchase the following medications over the counter and take as directed: Imodium 1 tablet every morning, then may take additional if needed.  Continue Colestid (we have sent additional refills to your pharmacy)  Discontinue Lomotil.  You will be due for a CMET, CBC, INR, AFP around 09/22/17. We will remind you closer to that time.  You will be due for a liver ultrasound for in January 2019 for [facility] screening. We will contact you with an appointment time/date when it gets closer to that time.  We have given you your 2nd twinrix (hepatitis A/B) injection today. We will contact you when it is time for your 3rd injection.  If you are age 42 or older, your body mass index should be between 23-30. Your Body mass index is 24.19 kg/m. If this is out of the aforementioned range listed, please consider follow up with your Primary Care Provider.  If you are age 12 or younger, your body mass index should be between 19-25. Your Body mass index is 24.19 kg/m. If this is out of the aformentioned range listed, please consider follow up with your Primary Care Provider.

## 2017-06-28 ENCOUNTER — Emergency Department (HOSPITAL_COMMUNITY): Payer: Self-pay

## 2017-06-28 ENCOUNTER — Inpatient Hospital Stay (HOSPITAL_COMMUNITY)
Admission: EM | Admit: 2017-06-28 | Discharge: 2017-07-01 | DRG: 373 | Disposition: A | Discharge status: Home / Self Care | Payer: Self-pay | Attending: Internal Medicine | Admitting: Internal Medicine

## 2017-06-28 DIAGNOSIS — Z885 Allergy status to narcotic agent status: Secondary | ICD-10-CM

## 2017-06-28 DIAGNOSIS — K219 Gastro-esophageal reflux disease without esophagitis: Secondary | ICD-10-CM | POA: Diagnosis present

## 2017-06-28 DIAGNOSIS — G8929 Other chronic pain: Secondary | ICD-10-CM | POA: Diagnosis present

## 2017-06-28 DIAGNOSIS — K7030 Alcoholic cirrhosis of liver without ascites: Secondary | ICD-10-CM | POA: Diagnosis present

## 2017-06-28 DIAGNOSIS — A0471 Enterocolitis due to Clostridium difficile, recurrent: Principal | ICD-10-CM | POA: Diagnosis present

## 2017-06-28 DIAGNOSIS — K589 Irritable bowel syndrome without diarrhea: Secondary | ICD-10-CM | POA: Diagnosis present

## 2017-06-28 DIAGNOSIS — F32A Depression, unspecified: Secondary | ICD-10-CM

## 2017-06-28 DIAGNOSIS — Z91041 Radiographic dye allergy status: Secondary | ICD-10-CM

## 2017-06-28 DIAGNOSIS — G5793 Unspecified mononeuropathy of bilateral lower limbs: Secondary | ICD-10-CM

## 2017-06-28 DIAGNOSIS — R0602 Shortness of breath: Secondary | ICD-10-CM

## 2017-06-28 DIAGNOSIS — Z8679 Personal history of other diseases of the circulatory system: Secondary | ICD-10-CM

## 2017-06-28 DIAGNOSIS — F17210 Nicotine dependence, cigarettes, uncomplicated: Secondary | ICD-10-CM | POA: Diagnosis present

## 2017-06-28 DIAGNOSIS — R197 Diarrhea, unspecified: Secondary | ICD-10-CM | POA: Diagnosis present

## 2017-06-28 DIAGNOSIS — F329 Major depressive disorder, single episode, unspecified: Secondary | ICD-10-CM

## 2017-06-28 DIAGNOSIS — Z79899 Other long term (current) drug therapy: Secondary | ICD-10-CM

## 2017-06-28 DIAGNOSIS — E8770 Fluid overload, unspecified: Secondary | ICD-10-CM | POA: Diagnosis not present

## 2017-06-28 DIAGNOSIS — Z792 Long term (current) use of antibiotics: Secondary | ICD-10-CM

## 2017-06-28 DIAGNOSIS — T50995A Adverse effect of other drugs, medicaments and biological substances, initial encounter: Secondary | ICD-10-CM | POA: Diagnosis not present

## 2017-06-28 DIAGNOSIS — K580 Irritable bowel syndrome with diarrhea: Secondary | ICD-10-CM | POA: Diagnosis present

## 2017-06-28 DIAGNOSIS — R112 Nausea with vomiting, unspecified: Secondary | ICD-10-CM

## 2017-06-28 DIAGNOSIS — F419 Anxiety disorder, unspecified: Secondary | ICD-10-CM | POA: Diagnosis present

## 2017-06-28 DIAGNOSIS — G629 Polyneuropathy, unspecified: Secondary | ICD-10-CM | POA: Diagnosis present

## 2017-06-28 DIAGNOSIS — F4310 Post-traumatic stress disorder, unspecified: Secondary | ICD-10-CM | POA: Diagnosis present

## 2017-06-28 DIAGNOSIS — Z8719 Personal history of other diseases of the digestive system: Secondary | ICD-10-CM

## 2017-06-28 DIAGNOSIS — Y92230 Patient room in hospital as the place of occurrence of the external cause: Secondary | ICD-10-CM | POA: Diagnosis not present

## 2017-06-28 DIAGNOSIS — E039 Hypothyroidism, unspecified: Secondary | ICD-10-CM | POA: Diagnosis present

## 2017-06-28 DIAGNOSIS — Z9049 Acquired absence of other specified parts of digestive tract: Secondary | ICD-10-CM

## 2017-06-28 DIAGNOSIS — R109 Unspecified abdominal pain: Secondary | ICD-10-CM | POA: Diagnosis present

## 2017-06-28 DIAGNOSIS — E876 Hypokalemia: Secondary | ICD-10-CM | POA: Diagnosis present

## 2017-06-28 HISTORY — DX: Malingerer (conscious simulation): Z76.5

## 2017-06-28 LAB — COMPREHENSIVE METABOLIC PANEL
ALBUMIN: 3.2 g/dL — AB (ref 3.5–5.0)
ALT: 16 U/L (ref 14–54)
ANION GAP: 14 (ref 5–15)
AST: 45 U/L — AB (ref 15–41)
Alkaline Phosphatase: 182 U/L — ABNORMAL HIGH (ref 38–126)
BILIRUBIN TOTAL: 3.7 mg/dL — AB (ref 0.3–1.2)
BUN: <5 mg/dL — ABNORMAL LOW (ref 6–20)
CHLORIDE: 100 mmol/L — AB (ref 101–111)
CO2: 20 mmol/L — ABNORMAL LOW (ref 22–32)
Calcium: 8.8 mg/dL — ABNORMAL LOW (ref 8.9–10.3)
Creatinine, Ser: 0.73 mg/dL (ref 0.44–1.00)
GFR calc Af Amer: >60 mL/min (ref >60)
GLUCOSE: 97 mg/dL (ref 65–99)
POTASSIUM: 2.6 mmol/L — AB (ref 3.5–5.1)
Sodium: 134 mmol/L — ABNORMAL LOW (ref 135–145)
TOTAL PROTEIN: 7.3 g/dL (ref 6.5–8.1)

## 2017-06-28 LAB — CBC
HEMATOCRIT: 28.3 % — AB (ref 36.0–46.0)
HEMOGLOBIN: 9.5 g/dL — AB (ref 12.0–15.0)
MCH: 33.9 pg (ref 26.0–34.0)
MCHC: 33.6 g/dL (ref 30.0–36.0)
MCV: 101.1 fL — ABNORMAL HIGH (ref 78.0–100.0)
Platelets: 105 10*3/uL — ABNORMAL LOW (ref 150–400)
RBC: 2.8 MIL/uL — ABNORMAL LOW (ref 3.87–5.11)
RDW: 18.2 % — ABNORMAL HIGH (ref 11.5–15.5)
WBC: 4.5 10*3/uL (ref 4.0–10.5)

## 2017-06-28 LAB — LIPASE, BLOOD: Lipase: 24 U/L (ref 11–51)

## 2017-06-28 LAB — I-STAT CG4 LACTIC ACID, ED: Lactic Acid, Venous: 2.8 mmol/L (ref 0.5–1.9)

## 2017-06-28 MED ORDER — FENTANYL CITRATE (PF) 100 MCG/2ML IJ SOLN
50.0000 ug | Freq: Once | INTRAMUSCULAR | Status: AC
  Administered 2017-06-28: 50 ug via INTRAVENOUS
  Filled 2017-06-28: qty 2

## 2017-06-28 MED ORDER — PIPERACILLIN-TAZOBACTAM 3.375 G IVPB: 3.3750 g | Freq: Three times a day (TID) | INTRAVENOUS | Status: DC

## 2017-06-28 MED ORDER — SODIUM CHLORIDE 0.9 % IV BOLUS (SEPSIS)
1000.0000 mL | Freq: Once | INTRAVENOUS | Status: AC
  Administered 2017-06-28: 1000 mL via INTRAVENOUS

## 2017-06-28 MED ORDER — POTASSIUM CHLORIDE 10 MEQ/100ML IV SOLN
10.0000 meq | INTRAVENOUS | Status: AC
  Administered 2017-06-28 (×3): 10 meq via INTRAVENOUS
  Filled 2017-06-28 (×3): qty 100

## 2017-06-28 MED ORDER — SODIUM CHLORIDE 0.9 % IV SOLN
1000.0000 mL | INTRAVENOUS | Status: DC
  Administered 2017-06-28 – 2017-06-29 (×4): 1000 mL via INTRAVENOUS

## 2017-06-28 MED ORDER — PIPERACILLIN-TAZOBACTAM 3.375 G IVPB 30 MIN
3.3750 g | Freq: Once | INTRAVENOUS | Status: AC
  Administered 2017-06-28: 3.375 g via INTRAVENOUS
  Filled 2017-06-28: qty 50

## 2017-06-28 MED ORDER — ONDANSETRON HCL 4 MG/2ML IJ SOLN
4.0000 mg | Freq: Once | INTRAMUSCULAR | Status: AC
  Administered 2017-06-28: 4 mg via INTRAVENOUS
  Filled 2017-06-28: qty 2

## 2017-06-28 MED ORDER — BARIUM SULFATE 2.1 % PO SUSP
ORAL | Status: AC
  Filled 2017-06-28: qty 1

## 2017-06-28 NOTE — ED Notes (Signed)
Dr Dayna Barker given a copy of lactic acid results 2.80
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
## 2017-06-28 NOTE — ED Notes (Signed)
Pt requesting pain medicine, EDP aware 

## 2017-06-28 NOTE — Progress Notes (Signed)
Pharmacy Antibiotic Note  Jamie Allen is a 46 y.o. female who presented to the [facility] on 06/28/2017 with RLQ pain and fevers. Pharmacy has been consulted to dose Zosyn for empiric intra-abdominal coverage. SCr 0.73, CrCl~70-80 ml/min  Zosyn 3.375g IV x 1 dose ordered to be given in the [facility]  Plan: 1. Zosyn 3.375g IV every 8 hours (infused over 4 hours) 2. Will continue to follow renal function, culture results, LOT, and antibiotic de-escalation plans   Height: 5\' 1"  (154.9 cm) Weight: 138 lb (62.6 kg) IBW/kg (Calculated) : 47.8  Temp (24hrs), Avg:99.1 F (37.3 C), Min:99.1 F (37.3 C), Max:99.1 F (37.3 C)   Recent Labs Lab 06/28/17 1901  WBC 4.5  CREATININE 0.73    Estimated Creatinine Clearance: 74.5 mL/min (by C-G formula based on SCr of 0.73 mg/dL).    Allergies  Allergen Reactions  . Iohexol Hives, Itching and Swelling  . Morphine And Related Itching    Can tolerate with Benadryl    Thank you for allowing pharmacy to be a part of this patient's care.  Lawson Radar 06/28/2017 9:48 PM

## 2017-06-28 NOTE — ED Triage Notes (Addendum)
Pt reports getting a Hepatits vaccine in her right arm, states "I jumped so they had to change the needle and put it in my left arm." States Wednesday her legs were burning and she had a 103 fever with chills. Pt states "I am having an allergic reaction." Pt also thinks she has appendicitis. Pt also states she is seeing her neurologist soon? Pt states fever chills. Right sided bdominal pain. Pt also states hard to breathe. Pt states she waited to come until now because a football game was on.   Pt also states she "falls and hits her head a lot and sometimes has big blood clots coming out of her nose. Pt states she pulls them out and throws them in the trash" PT now complaining of back pain.  And now crying and states "I have an infection."

## 2017-06-28 NOTE — ED Notes (Signed)
Pt states "I wish I could pee."

## 2017-06-28 NOTE — ED Notes (Signed)
Dr Laverta Baltimore given a copy of lactic acid results 3.76. CN Santiago Glad informed of results

## 2017-06-29 ENCOUNTER — Encounter (HOSPITAL_COMMUNITY): Payer: Self-pay | Admitting: Emergency Medicine

## 2017-06-29 DIAGNOSIS — R112 Nausea with vomiting, unspecified: Secondary | ICD-10-CM

## 2017-06-29 DIAGNOSIS — R197 Diarrhea, unspecified: Secondary | ICD-10-CM

## 2017-06-29 DIAGNOSIS — K580 Irritable bowel syndrome with diarrhea: Secondary | ICD-10-CM

## 2017-06-29 DIAGNOSIS — R1031 Right lower quadrant pain: Secondary | ICD-10-CM

## 2017-06-29 DIAGNOSIS — E876 Hypokalemia: Secondary | ICD-10-CM

## 2017-06-29 LAB — BASIC METABOLIC PANEL
Anion gap: 8 (ref 5–15)
BUN: <5 mg/dL — ABNORMAL LOW (ref 6–20)
CALCIUM: 7.8 mg/dL — AB (ref 8.9–10.3)
CO2: 23 mmol/L (ref 22–32)
CREATININE: 0.78 mg/dL (ref 0.44–1.00)
Chloride: 108 mmol/L (ref 101–111)
GFR calc non Af Amer: >60 mL/min (ref >60)
Glucose, Bld: 86 mg/dL (ref 65–99)
Potassium: 3.2 mmol/L — ABNORMAL LOW (ref 3.5–5.1)
SODIUM: 139 mmol/L (ref 135–145)

## 2017-06-29 LAB — GASTROINTESTINAL PANEL BY PCR, STOOL (REPLACES STOOL CULTURE)

## 2017-06-29 LAB — URINALYSIS, ROUTINE W REFLEX MICROSCOPIC
Bacteria, UA: NONE SEEN
Bilirubin Urine: NEGATIVE
GLUCOSE, UA: NEGATIVE mg/dL
KETONES UR: NEGATIVE mg/dL
LEUKOCYTES UA: NEGATIVE
Nitrite: NEGATIVE
PH: 6 (ref 5.0–8.0)
Protein, ur: NEGATIVE mg/dL
SPECIFIC GRAVITY, URINE: 1.005 (ref 1.005–1.030)
SQUAMOUS EPITHELIAL / LPF: NONE SEEN

## 2017-06-29 LAB — C DIFFICILE QUICK SCREEN W PCR REFLEX
C Diff antigen: POSITIVE — AB
C Diff toxin: NEGATIVE

## 2017-06-29 LAB — CG4 I-STAT (LACTIC ACID): Lactic Acid, Venous: 3.76 mmol/L (ref 0.5–1.9)

## 2017-06-29 LAB — MAGNESIUM: MAGNESIUM: 1.4 mg/dL — AB (ref 1.7–2.4)

## 2017-06-29 LAB — CLOSTRIDIUM DIFFICILE BY PCR: Toxigenic C. Difficile by PCR: POSITIVE — AB

## 2017-06-29 LAB — I-STAT CG4 LACTIC ACID, ED: Lactic Acid, Venous: 1.64 mmol/L (ref 0.5–1.9)

## 2017-06-29 MED ORDER — VANCOMYCIN 50 MG/ML ORAL SOLUTION: 125.0000 mg | ORAL | Status: DC

## 2017-06-29 MED ORDER — VANCOMYCIN 50 MG/ML ORAL SOLUTION: 125.0000 mg | Freq: Two times a day (BID) | ORAL | Status: DC

## 2017-06-29 MED ORDER — DIAZEPAM 5 MG PO TABS
5.0000 mg | ORAL_TABLET | Freq: Two times a day (BID) | ORAL | Status: DC | PRN
  Administered 2017-07-01: 5 mg via ORAL
  Filled 2017-06-29: qty 1

## 2017-06-29 MED ORDER — BOOST / RESOURCE BREEZE PO LIQD
1.0000 | Freq: Three times a day (TID) | ORAL | Status: DC
  Administered 2017-06-29 – 2017-06-30 (×5): 1 via ORAL

## 2017-06-29 MED ORDER — NICOTINE 21 MG/24HR TD PT24
21.0000 mg | MEDICATED_PATCH | Freq: Every day | TRANSDERMAL | Status: DC
  Administered 2017-06-30 – 2017-07-01 (×2): 21 mg via TRANSDERMAL
  Filled 2017-06-29 (×2): qty 1

## 2017-06-29 MED ORDER — IBUPROFEN 400 MG PO TABS: 400.0000 mg | ORAL_TABLET | Freq: Four times a day (QID) | ORAL | Status: DC | PRN

## 2017-06-29 MED ORDER — RIFAXIMIN 550 MG PO TABS
550.0000 mg | ORAL_TABLET | Freq: Three times a day (TID) | ORAL | Status: DC
  Administered 2017-06-29 – 2017-07-01 (×7): 550 mg via ORAL
  Filled 2017-06-29 (×7): qty 1

## 2017-06-29 MED ORDER — LOPERAMIDE HCL 2 MG PO CAPS
2.0000 mg | ORAL_CAPSULE | ORAL | Status: DC | PRN
  Administered 2017-06-29 (×2): 2 mg via ORAL
  Filled 2017-06-29 (×2): qty 1

## 2017-06-29 MED ORDER — POTASSIUM CHLORIDE CRYS ER 20 MEQ PO TBCR
40.0000 meq | EXTENDED_RELEASE_TABLET | Freq: Once | ORAL | Status: DC
  Filled 2017-06-29 (×2): qty 2

## 2017-06-29 MED ORDER — LEVOTHYROXINE SODIUM 75 MCG PO TABS
75.0000 ug | ORAL_TABLET | Freq: Every day | ORAL | Status: DC
  Administered 2017-06-29 – 2017-07-01 (×3): 75 ug via ORAL
  Filled 2017-06-29 (×3): qty 1

## 2017-06-29 MED ORDER — PROMETHAZINE HCL 25 MG PO TABS: 25.0000 mg | ORAL_TABLET | Freq: Three times a day (TID) | ORAL | Status: DC | PRN

## 2017-06-29 MED ORDER — KETOROLAC TROMETHAMINE 30 MG/ML IJ SOLN
30.0000 mg | Freq: Four times a day (QID) | INTRAMUSCULAR | Status: DC | PRN
  Administered 2017-06-29 – 2017-07-01 (×5): 30 mg via INTRAVENOUS
  Filled 2017-06-29 (×5): qty 1

## 2017-06-29 MED ORDER — VANCOMYCIN 50 MG/ML ORAL SOLUTION: 125.0000 mg | Freq: Every day | ORAL | Status: DC

## 2017-06-29 MED ORDER — ONDANSETRON HCL 4 MG PO TABS: 4.0000 mg | ORAL_TABLET | Freq: Four times a day (QID) | ORAL | Status: DC | PRN

## 2017-06-29 MED ORDER — FAMOTIDINE 20 MG PO TABS
20.0000 mg | ORAL_TABLET | Freq: Two times a day (BID) | ORAL | Status: DC
  Administered 2017-06-29 – 2017-07-01 (×6): 20 mg via ORAL
  Filled 2017-06-29 (×6): qty 1

## 2017-06-29 MED ORDER — SODIUM CHLORIDE 0.9 % IV SOLN: INTRAVENOUS | Status: DC

## 2017-06-29 MED ORDER — FOLIC ACID 1 MG PO TABS
1.0000 mg | ORAL_TABLET | Freq: Every day | ORAL | Status: DC
  Administered 2017-06-29 – 2017-07-01 (×3): 1 mg via ORAL
  Filled 2017-06-29 (×3): qty 1

## 2017-06-29 MED ORDER — KETOROLAC TROMETHAMINE 30 MG/ML IJ SOLN
30.0000 mg | Freq: Once | INTRAMUSCULAR | Status: AC
  Administered 2017-06-29: 30 mg via INTRAVENOUS
  Filled 2017-06-29: qty 1

## 2017-06-29 MED ORDER — PREGABALIN 25 MG PO CAPS
50.0000 mg | ORAL_CAPSULE | Freq: Three times a day (TID) | ORAL | Status: DC
  Administered 2017-06-29 (×2): 50 mg via ORAL
  Administered 2017-06-29: 100 mg via ORAL
  Administered 2017-06-30 (×3): 50 mg via ORAL
  Filled 2017-06-29 (×5): qty 2
  Filled 2017-06-29: qty 4

## 2017-06-29 MED ORDER — DIAZEPAM 5 MG PO TABS
2.5000 mg | ORAL_TABLET | Freq: Every day | ORAL | Status: DC
Start: 2017-06-29 — End: 2017-07-01
  Administered 2017-06-29 – 2017-06-30 (×3): 2.5 mg via ORAL
  Filled 2017-06-29 (×3): qty 1

## 2017-06-29 MED ORDER — VANCOMYCIN 50 MG/ML ORAL SOLUTION
125.0000 mg | Freq: Four times a day (QID) | ORAL | Status: DC
  Administered 2017-06-29 – 2017-07-01 (×9): 125 mg via ORAL
  Filled 2017-06-29 (×11): qty 2.5

## 2017-06-29 MED ORDER — ACETAMINOPHEN 650 MG RE SUPP
650.0000 mg | Freq: Four times a day (QID) | RECTAL | Status: DC | PRN
Start: 2017-06-29 — End: 2017-07-01

## 2017-06-29 MED ORDER — ACETAMINOPHEN 325 MG PO TABS
650.0000 mg | ORAL_TABLET | Freq: Four times a day (QID) | ORAL | Status: DC | PRN
  Administered 2017-06-29: 650 mg via ORAL
  Filled 2017-06-29: qty 2

## 2017-06-29 MED ORDER — POTASSIUM CHLORIDE CRYS ER 20 MEQ PO TBCR
40.0000 meq | EXTENDED_RELEASE_TABLET | Freq: Two times a day (BID) | ORAL | Status: DC
  Administered 2017-06-29 – 2017-07-01 (×5): 40 meq via ORAL
  Filled 2017-06-29 (×6): qty 2

## 2017-06-29 MED ORDER — ONDANSETRON HCL 4 MG/2ML IJ SOLN: 4.0000 mg | Freq: Four times a day (QID) | INTRAMUSCULAR | Status: DC | PRN

## 2017-06-29 MED ORDER — COLESTIPOL HCL 1 G PO TABS
1.0000 g | ORAL_TABLET | Freq: Two times a day (BID) | ORAL | Status: DC
  Administered 2017-06-29 – 2017-07-01 (×5): 1 g via ORAL
  Filled 2017-06-29 (×5): qty 1

## 2017-06-29 NOTE — Progress Notes (Signed)
Patient admitted after midnight, please see H&P.  Await c diff PCR and GI pathogen panel.  Patient c/o chills/fever. Started after Hep B vaccine per patient.  Eulogio Bear DO
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
## 2017-06-30 ENCOUNTER — Inpatient Hospital Stay (HOSPITAL_COMMUNITY): Payer: Self-pay

## 2017-06-30 DIAGNOSIS — A0471 Enterocolitis due to Clostridium difficile, recurrent: Secondary | ICD-10-CM

## 2017-06-30 DIAGNOSIS — R0602 Shortness of breath: Secondary | ICD-10-CM

## 2017-06-30 DIAGNOSIS — A0472 Enterocolitis due to Clostridium difficile, not specified as recurrent: Secondary | ICD-10-CM

## 2017-06-30 DIAGNOSIS — E8770 Fluid overload, unspecified: Secondary | ICD-10-CM

## 2017-06-30 LAB — BASIC METABOLIC PANEL
Anion gap: 9 (ref 5–15)
BUN: 10 mg/dL (ref 6–20)
CALCIUM: 7.6 mg/dL — AB (ref 8.9–10.3)
CO2: 15 mmol/L — ABNORMAL LOW (ref 22–32)
CREATININE: 0.98 mg/dL (ref 0.44–1.00)
Chloride: 114 mmol/L — ABNORMAL HIGH (ref 101–111)
GFR calc Af Amer: >60 mL/min (ref >60)
GLUCOSE: 82 mg/dL (ref 65–99)
POTASSIUM: 4.3 mmol/L (ref 3.5–5.1)
SODIUM: 138 mmol/L (ref 135–145)

## 2017-06-30 LAB — CBC
HCT: 27.8 % — ABNORMAL LOW (ref 36.0–46.0)
Hemoglobin: 8.9 g/dL — ABNORMAL LOW (ref 12.0–15.0)
MCH: 33.6 pg (ref 26.0–34.0)
MCHC: 32 g/dL (ref 30.0–36.0)
MCV: 104.9 fL — ABNORMAL HIGH (ref 78.0–100.0)
PLATELETS: 82 10*3/uL — AB (ref 150–400)
RBC: 2.65 MIL/uL — AB (ref 3.87–5.11)
RDW: 19.2 % — AB (ref 11.5–15.5)
WBC: 3.3 10*3/uL — ABNORMAL LOW (ref 4.0–10.5)

## 2017-06-30 MED ORDER — MAGNESIUM SULFATE 2 GM/50ML IV SOLN
2.0000 g | Freq: Once | INTRAVENOUS | Status: AC
  Administered 2017-06-30: 2 g via INTRAVENOUS
  Filled 2017-06-30: qty 50

## 2017-06-30 MED ORDER — FUROSEMIDE 10 MG/ML IJ SOLN
20.0000 mg | Freq: Once | INTRAMUSCULAR | Status: AC
  Administered 2017-06-30: 20 mg via INTRAVENOUS
  Filled 2017-06-30: qty 2

## 2017-06-30 MED ORDER — IPRATROPIUM-ALBUTEROL 0.5-2.5 (3) MG/3ML IN SOLN
3.0000 mL | Freq: Four times a day (QID) | RESPIRATORY_TRACT | Status: DC
  Administered 2017-06-30 (×2): 3 mL via RESPIRATORY_TRACT
  Filled 2017-06-30: qty 3

## 2017-06-30 MED ORDER — IPRATROPIUM-ALBUTEROL 0.5-2.5 (3) MG/3ML IN SOLN: 3.0000 mL | Freq: Two times a day (BID) | RESPIRATORY_TRACT | Status: DC

## 2017-06-30 NOTE — Progress Notes (Signed)
Patient is complaining of a new onset of cough. MD notified. MD gave verbal order for DG chest 1 View STAT. MD also stated that she would placed orders for lasix. Orders placed. Will continue to monitor.

## 2017-06-30 NOTE — Care Management Note (Signed)
Case Management Note  Patient Details  Name: Pernella Ackerley MRN: [id number] Date of Birth: [birth year]  Subjective/Objective:       CM following for progression and d/c planning.              Action/Plan: 06/30/2017 Noted pt has been active with [facility], will schedule there for hospital followup at the time of d/c. No other needs identified at this time.   Expected Discharge Date:                  Expected Discharge Plan:  Home/Self Care  In-House Referral:  Clinical Social Work  Discharge planning Services  CM Consult  Post Acute Care Choice:  NA Choice offered to:  NA  DME Arranged:  N/A DME Agency:  NA  HH Arranged:  NA HH Agency:  NA  Status of Service:  In process, will continue to follow  If discussed at Long Length of Stay Meetings, dates discussed:    Additional Comments:  Adron Bene, RN 06/30/2017, 2:06 PM

## 2017-06-30 NOTE — Progress Notes (Signed)
Jamie Squires, MD stated to give patient Lasix 20 mg IV. If patient has not urinated in one hour, then complete a bladder scan and notify with results. Orders followed. Will continue to monitor.

## 2017-06-30 NOTE — Progress Notes (Signed)
PROGRESS NOTE    Jamie Allen  FBP:[id number] DOB: [birth year] DOA: 06/28/2017 PCP: Boykin Nearing, MD   Outpatient Specialists:     Brief Narrative:  Jamie Allen is a 46 y.o. female with medical history significant of colitis, chronic abd pain / colitis, suspected IBS, EtOH cirrhosis, C.Diff in 2016.  Patient presents to the ED with c/o 4 day h/o RLQ abd pain and reported fever up to 103.  Associated N/V and diarrhea which onset today.  No meds at home besides motrin.  Patient was most recently admitted in July for abd pain and colitis on CT scan.  Followed up with Dr. Havery Moros just 6 days ago and he suspects IBS as a cause of her chronic diarrhea.  He ordered Xifaxan, colistid which havent been filled yet.  Also told patient she could use imodium.    Assessment & Plan:   Principal Problem:   Hypokalemia Active Problems:   Nausea vomiting and diarrhea   IBS (irritable bowel syndrome)   Abdominal pain   Alcoholic cirrhosis of liver without ascites (HCC)   Fluid excess   C. difficile colitis   c diff colitis -PO vanc- taper as 2nd episode -improving  Volume overload -IV lasix x 1- will re-assess volume status after and may need another dose -last echo: normal EF and diastolic fxn  Hypokalemia -replete  Liver cirrhosis -outpatient follow up   DVT prophylaxis:  SCD's  Code Status: Full Code   Family Communication:   Disposition Plan:     Consultants:     Procedures:      Subjective: Short of breath this AM  Objective: Vitals:   06/29/17 2217 06/30/17 0447 06/30/17 0831 06/30/17 0938  BP: 118/78 (!) 116/58  122/64  Pulse: 78 80  87  Resp: 18 18  18   Temp: 98.4 F (36.9 C) 98.5 F (36.9 C)  98.4 F (36.9 C)  TempSrc: Oral Oral  Oral  SpO2: 94% 92% 95% 96%  Weight: 60.8 kg (134 lb)     Height: 5' 1"  (1.549 m)       Intake/Output Summary (Last 24 hours) at 06/30/17 1228 Last data filed at 06/30/17 8242  Gross per 24  hour  Intake              390 ml  Output                0 ml  Net              390 ml   Filed Weights   06/28/17 1840 06/29/17 0646 06/29/17 2217  Weight: 62.6 kg (138 lb) 61.2 kg (135 lb) 60.8 kg (134 lb)    Examination:  General exam: mild discomfort with breathing Respiratory system: wheezing b/l Cardiovascular system: rrr Gastrointestinal system: Abdomen is nondistended, soft and nontender. No organomegaly or masses felt. Normal bowel sounds heard. Central nervous system: Alert and oriented. No focal neurological deficits. Extremities: Symmetric 5 x 5 power. Skin: No rashes, lesions or ulcers Psychiatry: Judgement and insight appear normal. Mood & affect appropriate.     Data Reviewed: I have personally reviewed following labs and imaging studies  CBC:  Recent Labs Lab 06/28/17 1901 06/30/17 0614  WBC 4.5 3.3*  HGB 9.5* 8.9*  HCT 28.3* 27.8*  MCV 101.1* 104.9*  PLT 105* 82*   Basic Metabolic Panel:  Recent Labs Lab 06/28/17 1901 06/29/17 0425 06/29/17 0800 06/30/17 0614  NA 134* 139  --  138  K 2.6* 3.2*  --  4.3  CL 100* 108  --  114*  CO2 20* 23  --  15*  GLUCOSE 97 86  --  82  BUN <5* <5*  --  10  CREATININE 0.73 0.78  --  0.98  CALCIUM 8.8* 7.8*  --  7.6*  MG  --   --  1.4*  --    GFR: Estimated Creatinine Clearance: 60 mL/min (by C-G formula based on SCr of 0.98 mg/dL). Liver Function Tests:  Recent Labs Lab 06/28/17 1901  AST 45*  ALT 16  ALKPHOS 182*  BILITOT 3.7*  PROT 7.3  ALBUMIN 3.2*    Recent Labs Lab 06/28/17 1901  LIPASE 24   No results for input(s): AMMONIA in the last 168 hours. Coagulation Profile: No results for input(s): INR, PROTIME in the last 168 hours. Cardiac Enzymes: No results for input(s): CKTOTAL, CKMB, CKMBINDEX, TROPONINI in the last 168 hours. BNP (last 3 results) No results for input(s): PROBNP in the last 8760 hours. HbA1C: No results for input(s): HGBA1C in the last 72 hours. CBG: No results for  input(s): GLUCAP in the last 168 hours. Lipid Profile: No results for input(s): CHOL, HDL, LDLCALC, TRIG, CHOLHDL, LDLDIRECT in the last 72 hours. Thyroid Function Tests: No results for input(s): TSH, T4TOTAL, FREET4, T3FREE, THYROIDAB in the last 72 hours. Anemia Panel: No results for input(s): VITAMINB12, FOLATE, FERRITIN, TIBC, IRON, RETICCTPCT in the last 72 hours. Urine analysis:    Component Value Date/Time   COLORURINE YELLOW 06/29/2017 0017   APPEARANCEUR CLEAR 06/29/2017 0017   LABSPEC 1.005 06/29/2017 0017   PHURINE 6.0 06/29/2017 0017   GLUCOSEU NEGATIVE 06/29/2017 0017   HGBUR SMALL (A) 06/29/2017 0017   BILIRUBINUR NEGATIVE 06/29/2017 0017   BILIRUBINUR negative 05/19/2016 1638   KETONESUR NEGATIVE 06/29/2017 0017   PROTEINUR NEGATIVE 06/29/2017 0017   UROBILINOGEN 0.2 05/19/2016 1638   UROBILINOGEN 0.2 08/17/2015 1853   NITRITE NEGATIVE 06/29/2017 0017   LEUKOCYTESUR NEGATIVE 06/29/2017 0017    ) Recent Results (from the past 240 hour(s))  Blood Culture (routine x 2)     Status: None (Preliminary result)   Collection Time: 06/28/17  9:45 PM  Result Value Ref Range Status   Specimen Description BLOOD RIGHT HAND  Final   Special Requests   Final    BOTTLES DRAWN AEROBIC AND ANAEROBIC Blood Culture adequate volume   Culture NO GROWTH 2 DAYS  Final   Report Status PENDING  Incomplete  Blood Culture (routine x 2)     Status: None (Preliminary result)   Collection Time: 06/28/17  9:56 PM  Result Value Ref Range Status   Specimen Description BLOOD LEFT ANTECUBITAL  Final   Special Requests   Final    BOTTLES DRAWN AEROBIC AND ANAEROBIC Blood Culture adequate volume   Culture NO GROWTH 2 DAYS  Final   Report Status PENDING  Incomplete  Gastrointestinal Panel by PCR , Stool     Status: None   Collection Time: 06/29/17 12:33 AM  Result Value Ref Range Status   Campylobacter species NOT DETECTED NOT DETECTED Final   Plesimonas shigelloides NOT DETECTED NOT DETECTED  Final   Salmonella species NOT DETECTED NOT DETECTED Final   Yersinia enterocolitica NOT DETECTED NOT DETECTED Final   Vibrio species NOT DETECTED NOT DETECTED Final   Vibrio cholerae NOT DETECTED NOT DETECTED Final   Enteroaggregative E coli (EAEC) NOT DETECTED NOT DETECTED Final   Enteropathogenic E coli (EPEC) NOT DETECTED NOT DETECTED Final   Enterotoxigenic E coli (  ETEC) NOT DETECTED NOT DETECTED Final   Shiga like toxin producing E coli (STEC) NOT DETECTED NOT DETECTED Final   Shigella/Enteroinvasive E coli (EIEC) NOT DETECTED NOT DETECTED Final   Cryptosporidium NOT DETECTED NOT DETECTED Final   Cyclospora cayetanensis NOT DETECTED NOT DETECTED Final   Entamoeba histolytica NOT DETECTED NOT DETECTED Final   Giardia lamblia NOT DETECTED NOT DETECTED Final   Adenovirus F40/41 NOT DETECTED NOT DETECTED Final   Astrovirus NOT DETECTED NOT DETECTED Final   Norovirus GI/GII NOT DETECTED NOT DETECTED Final   Rotavirus A NOT DETECTED NOT DETECTED Final   Sapovirus (I, II, IV, and V) NOT DETECTED NOT DETECTED Final  C difficile quick scan w PCR reflex     Status: Abnormal   Collection Time: 06/29/17 12:33 AM  Result Value Ref Range Status   C Diff antigen POSITIVE (A) NEGATIVE Final   C Diff toxin NEGATIVE NEGATIVE Final   C Diff interpretation Results are indeterminate. See PCR results.  Final  Clostridium Difficile by PCR     Status: Abnormal   Collection Time: 06/29/17 12:33 AM  Result Value Ref Range Status   Toxigenic C Difficile by pcr POSITIVE (A) NEGATIVE Final    Comment: Positive for toxigenic C. difficile with little to no toxin production. Only treat if clinical presentation suggests symptomatic illness.      Anti-infectives    Start     Dose/Rate Route Frequency Ordered Stop   08/04/17 1000  vancomycin (VANCOCIN) 50 mg/mL oral solution 125 mg     125 mg Oral Every 3 DAYS 06/29/17 1023 08/13/17 0959   07/27/17 1000  vancomycin (VANCOCIN) 50 mg/mL oral solution 125 mg       125 mg Oral Every other day 06/29/17 1023 08/04/17 0959   07/20/17 1000  vancomycin (VANCOCIN) 50 mg/mL oral solution 125 mg     125 mg Oral Daily 06/29/17 1023 07/27/17 0959   07/13/17 1000  vancomycin (VANCOCIN) 50 mg/mL oral solution 125 mg     125 mg Oral 2 times daily 06/29/17 1023 07/20/17 0959   06/29/17 1030  vancomycin (VANCOCIN) 50 mg/mL oral solution 125 mg     125 mg Oral 4 times daily 06/29/17 1023 07/13/17 0959   06/29/17 1000  rifaximin (XIFAXAN) tablet 550 mg     550 mg Oral 3 times daily 06/29/17 0150     06/29/17 0600  piperacillin-tazobactam (ZOSYN) IVPB 3.375 g  Status:  Discontinued     3.375 g 12.5 mL/hr over 240 Minutes Intravenous Every 8 hours 06/28/17 2153 06/29/17 0152   06/28/17 2145  piperacillin-tazobactam (ZOSYN) IVPB 3.375 g     3.375 g 100 mL/hr over 30 Minutes Intravenous  Once 06/28/17 2135 06/28/17 2321       Radiology Studies: Ct Abdomen Pelvis Wo Contrast  Result Date: 06/28/2017 CLINICAL DATA:  Abdominal pain and fever.  Suspected appendicitis. EXAM: CT ABDOMEN AND PELVIS WITHOUT CONTRAST TECHNIQUE: Multidetector CT imaging of the abdomen and pelvis was performed following the standard protocol without IV contrast. COMPARISON:  Abdominal CT 04/28/2017, additional priors FINDINGS: Lower chest: Linear atelectasis in both lower lobes. No pleural fluid. Hepatobiliary: The liver is enlarged with hypertrophy of the left lobe and caudate. Diffusely decreased hepatic density. Small nodular contours. No evidence of focal hepatic lesion. Clips in the gallbladder fossa postcholecystectomy. No biliary dilatation. Pancreas: No ductal dilatation or inflammation. Spleen: Splenomegaly. Spleen measures 13.6 x 6.7 x 15.5 cm (volume = 740 cm^3). Adrenals/Urinary Tract: Chronic left renal atrophy  and scarring in the upper pole. No hydronephrosis. No perinephric edema. No urolithiasis. Normal adrenal glands. Urinary bladder is distended without wall thickening.  Stomach/Bowel: Normal contrast filled appendix. No periappendiceal inflammation. No bowel obstruction, enteric contrast throughout the colon. Previous colonic wall thickening has resolved. Stomach is physiologically distended. No small or large bowel wall thickening, inflammatory change or distention. Vascular/Lymphatic: Minimally aortic atherosclerosis. No abdominopelvic adenopathy. Reproductive: Uterus and bilateral adnexa are unremarkable. Other: No ascites. No free air. Small fat containing umbilical hernia. Musculoskeletal: There are no acute or suspicious osseous abnormalities. IMPRESSION: 1. No acute abnormality in the abdomen/pelvis.  Normal appendix. 2. Hepatomegaly with morphologic changes of cirrhosis. Splenomegaly. No ascites. 3. Resolved colonic inflammation from prior exam. 4.  Aortic Atherosclerosis (ICD10-I70.0). Electronically Signed   By: Jeb Levering M.D.   On: 06/28/2017 23:24   Dg Chest 1 View  Result Date: 06/30/2017 CLINICAL DATA:  Shortness of breath. EXAM: CHEST 1 VIEW COMPARISON:  CT 09/23/2015.  Chest x-ray 03/26/2008. FINDINGS: Mediastinum hilar structures normal. Cardiomegaly with bilateral from interstitial prominence and bilateral pleural effusions consistent with CHF. Bilateral pneumonitis cannot be excluded. No pneumothorax . IMPRESSION: Congestive heart failure with bilateral interstitial edema and small bilateral pleural effusions . Electronically Signed   By: Marcello Moores  Register   On: 06/30/2017 11:59        Scheduled Meds: . colestipol  1 g Oral BID  . diazepam  2.5 mg Oral QHS  . famotidine  20 mg Oral BID  . feeding supplement  1 Container Oral TID BM  . folic acid  1 mg Oral Daily  . ipratropium-albuterol  3 mL Nebulization Q6H  . levothyroxine  75 mcg Oral QAC breakfast  . nicotine  21 mg Transdermal Daily  . potassium chloride  40 mEq Oral BID  . potassium chloride  40 mEq Oral Once  . pregabalin  50-100 mg Oral TID  . rifaximin  550 mg Oral TID  .  vancomycin  125 mg Oral QID   Followed by  . [START ON 07/13/2017] vancomycin  125 mg Oral BID   Followed by  . [START ON 07/20/2017] vancomycin  125 mg Oral Daily   Followed by  . [START ON 07/27/2017] vancomycin  125 mg Oral QODAY   Followed by  . [START ON 08/04/2017] vancomycin  125 mg Oral Q3 days   Continuous Infusions:   LOS: 1 day    Time spent: 35 min    Parker, DO [facility] Pager [phone]  If 7PM-7AM, please contact night-coverage [email or web address] Password [facility] 06/30/2017, 12:28 PM

## 2017-07-01 DIAGNOSIS — K7030 Alcoholic cirrhosis of liver without ascites: Secondary | ICD-10-CM

## 2017-07-01 LAB — CBC
HCT: 28.3 % — ABNORMAL LOW (ref 36.0–46.0)
Hemoglobin: 9.2 g/dL — ABNORMAL LOW (ref 12.0–15.0)
MCH: 33.8 pg (ref 26.0–34.0)
MCHC: 32.5 g/dL (ref 30.0–36.0)
MCV: 104 fL — ABNORMAL HIGH (ref 78.0–100.0)
PLATELETS: 91 10*3/uL — AB (ref 150–400)
RBC: 2.72 MIL/uL — AB (ref 3.87–5.11)
RDW: 19.4 % — ABNORMAL HIGH (ref 11.5–15.5)
WBC: 3.5 10*3/uL — AB (ref 4.0–10.5)

## 2017-07-01 LAB — MAGNESIUM: MAGNESIUM: 1.8 mg/dL (ref 1.7–2.4)

## 2017-07-01 LAB — BASIC METABOLIC PANEL
Anion gap: 7 (ref 5–15)
BUN: 11 mg/dL (ref 6–20)
CO2: 18 mmol/L — ABNORMAL LOW (ref 22–32)
Calcium: 9.2 mg/dL (ref 8.9–10.3)
Chloride: 115 mmol/L — ABNORMAL HIGH (ref 101–111)
Creatinine, Ser: 0.96 mg/dL (ref 0.44–1.00)
GFR calc Af Amer: >60 mL/min (ref >60)
Glucose, Bld: 94 mg/dL (ref 65–99)
POTASSIUM: 4.9 mmol/L (ref 3.5–5.1)
SODIUM: 140 mmol/L (ref 135–145)

## 2017-07-01 MED ORDER — FUROSEMIDE 10 MG/ML IJ SOLN
20.0000 mg | Freq: Once | INTRAMUSCULAR | Status: AC
  Administered 2017-07-01: 20 mg via INTRAVENOUS
  Filled 2017-07-01: qty 2

## 2017-07-01 MED ORDER — VANCOMYCIN 50 MG/ML ORAL SOLUTION: ORAL | 0 refills | Status: DC

## 2017-07-01 MED ORDER — PREGABALIN 50 MG PO CAPS
50.0000 mg | ORAL_CAPSULE | Freq: Three times a day (TID) | ORAL | Status: DC
  Administered 2017-07-01: 50 mg via ORAL
  Filled 2017-07-01: qty 1

## 2017-07-01 MED ORDER — PREGABALIN 50 MG PO CAPS: 50.0000 mg | ORAL_CAPSULE | Freq: Three times a day (TID) | ORAL | Status: DC

## 2017-07-01 MED ORDER — FUROSEMIDE 10 MG/ML IJ SOLN
40.0000 mg | Freq: Once | INTRAMUSCULAR | Status: AC
  Administered 2017-07-01: 40 mg via INTRAVENOUS
  Filled 2017-07-01: qty 4

## 2017-07-01 MED FILL — VANCOMYCIN HCL 125 MG CAP: 125 | 42 days supply | Qty: 86 | Fill #0

## 2017-07-01 NOTE — Discharge Summary (Signed)
Physician Discharge Summary  Jamie Allen RWE:[id number] DOB: [birth year] DOA: 06/28/2017  PCP: Boykin Nearing, MD  Admit date: 06/28/2017 Discharge date: 07/01/2017   Recommendations for Outpatient Follow-Up:   1. Close GI follow up   Discharge Diagnosis:   Principal Problem:   Hypokalemia Active Problems:   Nausea vomiting and diarrhea   IBS (irritable bowel syndrome)   Abdominal pain   Alcoholic cirrhosis of liver without ascites (HCC)   Fluid excess   C. difficile colitis   Discharge disposition:  Home  Discharge Condition: Improved.  Diet recommendation: Low sodium, heart healthy  Wound care: None.   History of Present Illness:    Jamie Allen is a 46 y.o. female with medical history significant of colitis, chronic abd pain / colitis, suspected IBS, EtOH cirrhosis, C.Diff in 2016.  Patient presents to the ED with c/o 4 day h/o RLQ abd pain and reported fever up to 103.  Associated N/V and diarrhea which onset today.  No meds at home besides motrin.  Patient was most recently admitted in July for abd pain and colitis on CT scan.  Followed up with Dr. Havery Moros just 6 days ago and he suspects IBS as a cause of her chronic diarrhea.  He ordered Xifaxan, colistid which havent been filled yet.  Also told patient she could use imodium.   Hospital Course by Problem:   c diff colitis -PO vanc- taper as 2nd episode -improving  Volume overload due to excessive IVF -IV lasix- diuresed -last echo 2018: normal EF and diastolic fxn  Hypokalemia -repleted  hypomagnesium -replace  Liver cirrhosis -outpatient follow up    Medical Consultants:    None.   Discharge Exam:   Vitals:   07/01/17 0331 07/01/17 1000  BP: (!) 144/85 134/68  Pulse: 90 89  Resp: (!) 21 20  Temp: 97.6 F (36.4 C) 97.9 F (36.6 C)  SpO2: 95% 96%   Vitals:   06/30/17 1851 06/30/17 2025 07/01/17 0331 07/01/17 1000  BP: 125/65 121/69 (!) 144/85 134/68  Pulse:  84 90 90 89  Resp: 18 19 (!) 21 20  Temp: 98.2 F (36.8 C) 98.3 F (36.8 C) 97.6 F (36.4 C) 97.9 F (36.6 C)  TempSrc: Oral Oral Oral Oral  SpO2: 95% 94% 95% 96%  Weight:  60.6 kg (133 lb 9.6 oz)    Height:        Gen:  NAD    The results of significant diagnostics from this hospitalization (including imaging, microbiology, ancillary and laboratory) are listed below for reference.     Procedures and Diagnostic Studies:   Ct Abdomen Pelvis Wo Contrast  Result Date: 06/28/2017 CLINICAL DATA:  Abdominal pain and fever.  Suspected appendicitis. EXAM: CT ABDOMEN AND PELVIS WITHOUT CONTRAST TECHNIQUE: Multidetector CT imaging of the abdomen and pelvis was performed following the standard protocol without IV contrast. COMPARISON:  Abdominal CT 04/28/2017, additional priors FINDINGS: Lower chest: Linear atelectasis in both lower lobes. No pleural fluid. Hepatobiliary: The liver is enlarged with hypertrophy of the left lobe and caudate. Diffusely decreased hepatic density. Small nodular contours. No evidence of focal hepatic lesion. Clips in the gallbladder fossa postcholecystectomy. No biliary dilatation. Pancreas: No ductal dilatation or inflammation. Spleen: Splenomegaly. Spleen measures 13.6 x 6.7 x 15.5 cm (volume = 740 cm^3). Adrenals/Urinary Tract: Chronic left renal atrophy and scarring in the upper pole. No hydronephrosis. No perinephric edema. No urolithiasis. Normal adrenal glands. Urinary bladder is distended without wall thickening. Stomach/Bowel: Normal contrast filled appendix.  No periappendiceal inflammation. No bowel obstruction, enteric contrast throughout the colon. Previous colonic wall thickening has resolved. Stomach is physiologically distended. No small or large bowel wall thickening, inflammatory change or distention. Vascular/Lymphatic: Minimally aortic atherosclerosis. No abdominopelvic adenopathy. Reproductive: Uterus and bilateral adnexa are unremarkable. Other: No  ascites. No free air. Small fat containing umbilical hernia. Musculoskeletal: There are no acute or suspicious osseous abnormalities. IMPRESSION: 1. No acute abnormality in the abdomen/pelvis.  Normal appendix. 2. Hepatomegaly with morphologic changes of cirrhosis. Splenomegaly. No ascites. 3. Resolved colonic inflammation from prior exam. 4.  Aortic Atherosclerosis (ICD10-I70.0). Electronically Signed   By: Jeb Levering M.D.   On: 06/28/2017 23:24     Labs:   Basic Metabolic Panel:  Recent Labs Lab 06/28/17 1901 06/29/17 0425 06/29/17 0800 06/30/17 0614 07/01/17 0513 07/01/17 1130  NA 134* 139  --  138 140  --   K 2.6* 3.2*  --  4.3 4.9  --   CL 100* 108  --  114* 115*  --   CO2 20* 23  --  15* 18*  --   GLUCOSE 97 86  --  82 94  --   BUN <5* <5*  --  10 11  --   CREATININE 0.73 0.78  --  0.98 0.96  --   CALCIUM 8.8* 7.8*  --  7.6* 9.2  --   MG  --   --  1.4*  --   --  1.8   GFR Estimated Creatinine Clearance: 61.2 mL/min (by C-G formula based on SCr of 0.96 mg/dL). Liver Function Tests:  Recent Labs Lab 06/28/17 1901  AST 45*  ALT 16  ALKPHOS 182*  BILITOT 3.7*  PROT 7.3  ALBUMIN 3.2*    Recent Labs Lab 06/28/17 1901  LIPASE 24   No results for input(s): AMMONIA in the last 168 hours. Coagulation profile No results for input(s): INR, PROTIME in the last 168 hours.  CBC:  Recent Labs Lab 06/28/17 1901 06/30/17 0614 07/01/17 0513  WBC 4.5 3.3* 3.5*  HGB 9.5* 8.9* 9.2*  HCT 28.3* 27.8* 28.3*  MCV 101.1* 104.9* 104.0*  PLT 105* 82* 91*   Cardiac Enzymes: No results for input(s): CKTOTAL, CKMB, CKMBINDEX, TROPONINI in the last 168 hours. BNP: Invalid input(s): POCBNP CBG: No results for input(s): GLUCAP in the last 168 hours. D-Dimer No results for input(s): DDIMER in the last 72 hours. Hgb A1c No results for input(s): HGBA1C in the last 72 hours. Lipid Profile No results for input(s): CHOL, HDL, LDLCALC, TRIG, CHOLHDL, LDLDIRECT in the last  72 hours. Thyroid function studies No results for input(s): TSH, T4TOTAL, T3FREE, THYROIDAB in the last 72 hours.  Invalid input(s): FREET3 Anemia work up No results for input(s): VITAMINB12, FOLATE, FERRITIN, TIBC, IRON, RETICCTPCT in the last 72 hours. Microbiology Recent Results (from the past 240 hour(s))  Blood Culture (routine x 2)     Status: None (Preliminary result)   Collection Time: 06/28/17  9:45 PM  Result Value Ref Range Status   Specimen Description BLOOD RIGHT HAND  Final   Special Requests   Final    BOTTLES DRAWN AEROBIC AND ANAEROBIC Blood Culture adequate volume   Culture NO GROWTH 3 DAYS  Final   Report Status PENDING  Incomplete  Blood Culture (routine x 2)     Status: None (Preliminary result)   Collection Time: 06/28/17  9:56 PM  Result Value Ref Range Status   Specimen Description BLOOD LEFT ANTECUBITAL  Final   Special  Requests   Final    BOTTLES DRAWN AEROBIC AND ANAEROBIC Blood Culture adequate volume   Culture NO GROWTH 3 DAYS  Final   Report Status PENDING  Incomplete  Gastrointestinal Panel by PCR , Stool     Status: None   Collection Time: 06/29/17 12:33 AM  Result Value Ref Range Status   Campylobacter species NOT DETECTED NOT DETECTED Final   Plesimonas shigelloides NOT DETECTED NOT DETECTED Final   Salmonella species NOT DETECTED NOT DETECTED Final   Yersinia enterocolitica NOT DETECTED NOT DETECTED Final   Vibrio species NOT DETECTED NOT DETECTED Final   Vibrio cholerae NOT DETECTED NOT DETECTED Final   Enteroaggregative E coli (EAEC) NOT DETECTED NOT DETECTED Final   Enteropathogenic E coli (EPEC) NOT DETECTED NOT DETECTED Final   Enterotoxigenic E coli (ETEC) NOT DETECTED NOT DETECTED Final   Shiga like toxin producing E coli (STEC) NOT DETECTED NOT DETECTED Final   Shigella/Enteroinvasive E coli (EIEC) NOT DETECTED NOT DETECTED Final   Cryptosporidium NOT DETECTED NOT DETECTED Final   Cyclospora cayetanensis NOT DETECTED NOT DETECTED  Final   Entamoeba histolytica NOT DETECTED NOT DETECTED Final   Giardia lamblia NOT DETECTED NOT DETECTED Final   Adenovirus F40/41 NOT DETECTED NOT DETECTED Final   Astrovirus NOT DETECTED NOT DETECTED Final   Norovirus GI/GII NOT DETECTED NOT DETECTED Final   Rotavirus A NOT DETECTED NOT DETECTED Final   Sapovirus (I, II, IV, and V) NOT DETECTED NOT DETECTED Final  C difficile quick scan w PCR reflex     Status: Abnormal   Collection Time: 06/29/17 12:33 AM  Result Value Ref Range Status   C Diff antigen POSITIVE (A) NEGATIVE Final   C Diff toxin NEGATIVE NEGATIVE Final   C Diff interpretation Results are indeterminate. See PCR results.  Final  Clostridium Difficile by PCR     Status: Abnormal   Collection Time: 06/29/17 12:33 AM  Result Value Ref Range Status   Toxigenic C Difficile by pcr POSITIVE (A) NEGATIVE Final    Comment: Positive for toxigenic C. difficile with little to no toxin production. Only treat if clinical presentation suggests symptomatic illness.     Discharge Instructions:   Discharge Instructions    Diet general    Complete by:  As directed    Increase activity slowly    Complete by:  As directed      Allergies as of 07/01/2017      Reactions   Iohexol Hives, Itching, Swelling   Morphine And Related Itching   Can tolerate with Benadryl      Medication List    STOP taking these medications   diazepam 5 MG tablet Commonly known as:  VALIUM     TAKE these medications   colestipol 1 g tablet Commonly known as:  COLESTID Take 1 tablet (1 g total) by mouth 2 (two) times daily.   famotidine 20 MG tablet Commonly known as:  PEPCID Take 1 tablet (20 mg total) by mouth 2 (two) times daily.   feeding supplement Liqd Take 1 Container by mouth 3 (three) times daily between meals.   folic acid 1 MG tablet Commonly known as:  FOLVITE Take 1 tablet (1 mg total) by mouth daily.   ibuprofen 200 MG tablet Commonly known as:  ADVIL,MOTRIN Take 400 mg  by mouth every 6 (six) hours as needed (pain).   levothyroxine 75 MCG tablet Commonly known as:  SYNTHROID, LEVOTHROID Take 1 tablet (75 mcg total) by mouth daily before  breakfast.   pregabalin 50 MG capsule Commonly known as:  LYRICA Take 1-2 capsules (50-100 mg total) by mouth 3 (three) times daily. For PASS What changed:  how much to take   promethazine 25 MG tablet Commonly known as:  PHENERGAN Take 1 tablet (25 mg total) by mouth every 8 (eight) hours as needed for nausea or vomiting.   rifaximin 550 MG Tabs tablet Commonly known as:  XIFAXAN Take 1 tablet (550 mg total) by mouth 3 (three) times daily.   vancomycin 50 mg/mL oral solution Commonly known as:  VANCOCIN 2.83m QID x 14 days then BID x 7 days then daily x 7 days then QOD x 7 days then q 3 days x 7 days            Discharge Care Instructions        Start     Ordered   07/01/17 0000  pregabalin (LYRICA) 50 MG capsule  3 times daily     07/01/17 1402   07/01/17 0000  Increase activity slowly     07/01/17 1402   07/01/17 0000  vancomycin (VANCOCIN) 50 mg/mL oral solution     07/01/17 1402   07/01/17 0000  Diet general     07/01/17 1402     Follow-up Information    Funches, Josalyn, MD Follow up in 1 week(s).   Specialty:  Family Medicine Contact information: [id number] [phone]           Time coordinating discharge: 35 min  Signed:  Pauletta Pickney U Malyiah Fellows   [facility] 07/01/2017, 3:45 PM

## 2017-07-01 NOTE — Progress Notes (Addendum)
Patient c/o SOB, RR 24, with increased WOB, lungs sounds clear antianxiety med admininstered and practitioner nofitied.  Orders received for lasix 40 mg iv.

## 2017-07-01 NOTE — Progress Notes (Signed)
Patient discharged to home with husband. IV removed. Telemetry removed. All discharge instructions reviewed. C-diff education provided. Patient left unit in stable condition.  Sheliah Plane RN

## 2017-07-03 LAB — CULTURE, BLOOD (ROUTINE X 2)
Culture: NO GROWTH
Culture: NO GROWTH
Special Requests: ADEQUATE
Special Requests: ADEQUATE

## 2017-07-06 ENCOUNTER — Ambulatory Visit: Payer: Self-pay | Admitting: Neurology

## 2017-07-22 ENCOUNTER — Telehealth: Payer: Self-pay | Admitting: Family Medicine

## 2017-07-22 NOTE — Telephone Encounter (Signed)
Patient called requesting to speak nurse, pt would like to know before her appointment if she would have to provide a specimen. If so pt would like a call so she could bring it in with her. Please f/up

## 2017-07-23 NOTE — Telephone Encounter (Signed)
Patient is being seen as Jamie Allen. Nurse is unable to determine the needs of the visit prior to patient being seen. Patient should report to appointment and complete testing or specimen collection on site.

## 2017-07-31 ENCOUNTER — Inpatient Hospital Stay: Payer: Self-pay
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
## 2017-08-13 ENCOUNTER — Ambulatory Visit: Payer: Self-pay | Attending: Internal Medicine | Admitting: Physician Assistant

## 2017-08-13 ENCOUNTER — Other Ambulatory Visit: Payer: Self-pay

## 2017-08-13 VITALS — BP 107/65 | HR 90 | Temp 98.1°F | Resp 18 | Ht 61.0 in | Wt 134.0 lb

## 2017-08-13 DIAGNOSIS — K219 Gastro-esophageal reflux disease without esophagitis: Secondary | ICD-10-CM | POA: Insufficient documentation

## 2017-08-13 DIAGNOSIS — R110 Nausea: Secondary | ICD-10-CM

## 2017-08-13 DIAGNOSIS — Z5189 Encounter for other specified aftercare: Secondary | ICD-10-CM | POA: Insufficient documentation

## 2017-08-13 DIAGNOSIS — E034 Atrophy of thyroid (acquired): Secondary | ICD-10-CM

## 2017-08-13 DIAGNOSIS — K7030 Alcoholic cirrhosis of liver without ascites: Secondary | ICD-10-CM

## 2017-08-13 DIAGNOSIS — Z793 Long term (current) use of hormonal contraceptives: Secondary | ICD-10-CM | POA: Insufficient documentation

## 2017-08-13 DIAGNOSIS — D649 Anemia, unspecified: Secondary | ICD-10-CM

## 2017-08-13 DIAGNOSIS — Z79899 Other long term (current) drug therapy: Secondary | ICD-10-CM | POA: Insufficient documentation

## 2017-08-13 DIAGNOSIS — R002 Palpitations: Secondary | ICD-10-CM

## 2017-08-13 DIAGNOSIS — Z791 Long term (current) use of non-steroidal anti-inflammatories (NSAID): Secondary | ICD-10-CM | POA: Insufficient documentation

## 2017-08-13 MED ORDER — LEVOTHYROXINE SODIUM 75 MCG PO TABS: 75.0000 ug | ORAL_TABLET | Freq: Every day | ORAL | 2 refills | Status: DC

## 2017-08-13 MED ORDER — PROMETHAZINE HCL 25 MG PO TABS: 25.0000 mg | ORAL_TABLET | Freq: Three times a day (TID) | ORAL | 2 refills | Status: DC | PRN

## 2017-08-13 MED FILL — ?LEVOTHYROXINE 75 MCG TABLE: 75 | 30 days supply | Qty: 30 | Fill #2

## 2017-08-13 MED FILL — PROMETHAZINE 25 MG TABLET: 25 | 15 days supply | Qty: 45 | Fill #2

## 2017-08-13 NOTE — Patient Instructions (Addendum)
Make appt with gastroenterology.    [email or web address]

## 2017-08-14 LAB — COMPREHENSIVE METABOLIC PANEL
A/G RATIO: 1.2 (ref 1.2–2.2)
ALT: 17 IU/L (ref 0–32)
AST: 82 IU/L — AB (ref 0–40)
Albumin: 4.2 g/dL (ref 3.5–5.5)
Alkaline Phosphatase: 259 IU/L — ABNORMAL HIGH (ref 39–117)
BUN/Creatinine Ratio: 5 — ABNORMAL LOW (ref 9–23)
BUN: 5 mg/dL — ABNORMAL LOW (ref 6–24)
Bilirubin Total: 6.4 mg/dL — ABNORMAL HIGH (ref 0.0–1.2)
CALCIUM: 9.6 mg/dL (ref 8.7–10.2)
CO2: 23 mmol/L (ref 20–29)
Chloride: 95 mmol/L — ABNORMAL LOW (ref 96–106)
Creatinine, Ser: 0.91 mg/dL (ref 0.57–1.00)
GFR calc Af Amer: 88 mL/min/{1.73_m2} (ref >59)
GFR, EST NON AFRICAN AMERICAN: 76 mL/min/{1.73_m2} (ref >59)
Globulin, Total: 3.6 g/dL (ref 1.5–4.5)
Glucose: 86 mg/dL (ref 65–99)
POTASSIUM: 3.7 mmol/L (ref 3.5–5.2)
Sodium: 136 mmol/L (ref 134–144)
Total Protein: 7.8 g/dL (ref 6.0–8.5)

## 2017-08-14 LAB — TSH: TSH: 1.87 u[IU]/mL (ref 0.450–4.500)

## 2017-08-14 LAB — CBC WITH DIFFERENTIAL/PLATELET
BASOS ABS: 0 10*3/uL (ref 0.0–0.2)
Basos: 0 %
EOS (ABSOLUTE): 0.1 10*3/uL (ref 0.0–0.4)
EOS: 2 %
HEMATOCRIT: 24.7 % — AB (ref 34.0–46.6)
Hemoglobin: 8.7 g/dL — ABNORMAL LOW (ref 11.1–15.9)
IMMATURE GRANULOCYTES: 0 %
Immature Grans (Abs): 0 10*3/uL (ref 0.0–0.1)
Lymphocytes Absolute: 1 10*3/uL (ref 0.7–3.1)
Lymphs: 16 %
MCH: 36.7 pg — ABNORMAL HIGH (ref 26.6–33.0)
MCHC: 35.2 g/dL (ref 31.5–35.7)
MCV: 104 fL — ABNORMAL HIGH (ref 79–97)
MONOS ABS: 0.5 10*3/uL (ref 0.1–0.9)
Monocytes: 8 %
NEUTROS PCT: 74 %
Neutrophils Absolute: 4.6 10*3/uL (ref 1.4–7.0)
PLATELETS: 118 10*3/uL — AB (ref 150–379)
RBC: 2.37 x10E6/uL — AB (ref 3.77–5.28)
RDW: 17.6 % — AB (ref 12.3–15.4)
WBC: 6.3 10*3/uL (ref 3.4–10.8)

## 2017-08-21 ENCOUNTER — Encounter: Payer: Self-pay | Admitting: Neurology

## 2017-08-21 ENCOUNTER — Ambulatory Visit (INDEPENDENT_AMBULATORY_CARE_PROVIDER_SITE_OTHER): Payer: Self-pay | Admitting: Neurology

## 2017-08-21 ENCOUNTER — Other Ambulatory Visit (INDEPENDENT_AMBULATORY_CARE_PROVIDER_SITE_OTHER): Payer: Self-pay

## 2017-08-21 VITALS — BP 100/60 | HR 92 | Ht 61.0 in | Wt 134.1 lb

## 2017-08-21 DIAGNOSIS — R202 Paresthesia of skin: Secondary | ICD-10-CM

## 2017-08-21 DIAGNOSIS — G629 Polyneuropathy, unspecified: Secondary | ICD-10-CM

## 2017-08-21 LAB — FOLATE: FOLATE: 9.5 ng/mL (ref >5.9)

## 2017-08-21 NOTE — Patient Instructions (Signed)
1.  NCS/EMG of the left arm and leg 2.  Check labs 3.  Physical therapy for gait training  Return to clinic in 4 months
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
## 2017-08-26 ENCOUNTER — Telehealth: Payer: Self-pay | Admitting: *Deleted

## 2017-08-26 LAB — METHYLMALONIC ACID, SERUM: METHYLMALONIC ACID, QUANT: 123 nmol/L (ref 87–318)

## 2017-08-26 LAB — VITAMIN B1

## 2017-08-26 NOTE — Telephone Encounter (Signed)
-----   Message from Jamie Berthold, DO sent at 08/26/2017 10:59 AM EST ----- Please inform patient that her vitamin B1 is very low and she needs to start thiamine 100mg  daily.  She also needs to try to stop drinking alcohol, as alcohol causing thiamine deficiency.

## 2017-08-26 NOTE — Telephone Encounter (Signed)
Patient given results and instructions.   

## 2017-09-01 ENCOUNTER — Telehealth (INDEPENDENT_AMBULATORY_CARE_PROVIDER_SITE_OTHER): Payer: Self-pay | Admitting: *Deleted

## 2017-09-01 NOTE — Telephone Encounter (Signed)
-----   Message from Argentina Donovan, [place] sent at 08/17/2017  9:28 AM EDT ----- Labs remaining about the same.  Get appointment with Gastroenterologist/liver specialist as we discussed and f/up as planned.  Thanks, Freeman Caldron, PA-C
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
## 2017-09-01 NOTE — Telephone Encounter (Signed)
Patient verified DOB Patient is aware of labs being the same and needing to follow up with the liver specialist. Patient states she will contact them for an appointment which will probably be after November because she is seeing cardiology and neurology at the end of this month. Patient also has appointment with [facility] for her lyrica on tomorrow and may need gabapentin to bridge.

## 2017-09-02 ENCOUNTER — Encounter: Payer: Self-pay | Admitting: Cardiovascular Disease

## 2017-09-02 ENCOUNTER — Ambulatory Visit: Payer: No Typology Code available for payment source | Admitting: Cardiovascular Disease

## 2017-09-02 VITALS — BP 103/59 | HR 93 | Ht 61.0 in | Wt 134.4 lb

## 2017-09-02 DIAGNOSIS — R42 Dizziness and giddiness: Secondary | ICD-10-CM

## 2017-09-02 DIAGNOSIS — R002 Palpitations: Secondary | ICD-10-CM

## 2017-09-02 DIAGNOSIS — F1020 Alcohol dependence, uncomplicated: Secondary | ICD-10-CM

## 2017-09-02 NOTE — Patient Instructions (Signed)
Medication Instructions:  Your physician recommends that you continue on your current medications as directed. Please refer to the Current Medication list given to you today.  Labwork: NONE  Testing/Procedures: Your physician has recommended that you wear a holter monitor. Holter monitors are medical devices that record the heart's electrical activity. Doctors most often use these monitors to diagnose arrhythmias. Arrhythmias are problems with the speed or rhythm of the heartbeat. The monitor is a small, portable device. You can wear one while you do your normal daily activities. This is usually used to diagnose what is causing palpitations/syncope (passing out). [place]  Follow-Up: Your physician recommends that you schedule a follow-up appointment in: [place]  If you need a refill on your cardiac medications before your next appointment, please call your pharmacy.

## 2017-09-03 ENCOUNTER — Ambulatory Visit (INDEPENDENT_AMBULATORY_CARE_PROVIDER_SITE_OTHER): Payer: No Typology Code available for payment source

## 2017-09-03 DIAGNOSIS — R002 Palpitations: Secondary | ICD-10-CM

## 2017-09-08 ENCOUNTER — Telehealth: Payer: Self-pay | Admitting: Internal Medicine

## 2017-09-08 NOTE — Telephone Encounter (Signed)
Pt calling to request gabapentin (NEURONTIN) 300 MG capsule Since she wont get her LYRICA  for another 2 weeks, please she is a lot pain she want to know if you can auth until she get her med, since she told on Monday the pharmacy to request this med, please follow up

## 2017-09-09 MED ORDER — GABAPENTIN 300 MG PO CAPS: 300.0000 mg | ORAL_CAPSULE | Freq: Every day | ORAL | 0 refills | Status: DC

## 2017-09-09 MED FILL — GABAPENTIN 300 MG CAPSULE: 300 | 30 days supply | Qty: 30 | Fill #0

## 2017-09-14 ENCOUNTER — Telehealth: Payer: Self-pay | Admitting: *Deleted

## 2017-09-14 DIAGNOSIS — K7460 Unspecified cirrhosis of liver: Secondary | ICD-10-CM

## 2017-09-14 NOTE — Telephone Encounter (Signed)
Labs have been entered in [place]. Abdominal ultrasound has been scheduled at [facility] on Tuesday, 10/27/17 at 9:00 am. NPO midnight. I have left a message with female for patient to call back.

## 2017-09-14 NOTE — Telephone Encounter (Signed)
-----   Message from Larina Bras, [place] sent at 06/23/2017 10:32 AM EDT ----- Pt needs cmet, cbc, inr, afp drawn around 09/22/17. She also needs to be scheduled for abdominal ultrasound for January for [facility] screening (cirrhosis). Schedule and call patient.

## 2017-09-15 ENCOUNTER — Ambulatory Visit (INDEPENDENT_AMBULATORY_CARE_PROVIDER_SITE_OTHER): Payer: No Typology Code available for payment source | Admitting: Neurology

## 2017-09-15 ENCOUNTER — Other Ambulatory Visit: Payer: Self-pay | Admitting: *Deleted

## 2017-09-15 DIAGNOSIS — G629 Polyneuropathy, unspecified: Secondary | ICD-10-CM

## 2017-09-15 DIAGNOSIS — R269 Unspecified abnormalities of gait and mobility: Secondary | ICD-10-CM

## 2017-09-15 NOTE — Procedures (Signed)
[facility]  [place], [address]  [place], [place] [address] Tel: [phone] Fax:  [phone] Test Date:  09/15/2017  Patient: Jamie Allen DOB: Sep 08, 1971 Physician: Narda Amber, DO  Sex: Female Height: 5\' 1"  Ref Phys: Narda Amber, DO  ID#: [id number] Temp: 33.3C Technician:    Patient Complaints: This is a 46 year-old female with history of alcohol use referred for evaluation of worsening neuropathy of the hands and feet, worse on the left.  NCV & EMG Findings: Extensive electrodiagnostic testing of the left upper and lower extremity shows: 1. Left median, ulnar, and radial sensory responses show reduced amplitude. 2. Left sural and superficial peroneal sensory responses are absent. 3. Left median and ulnar motor responses are within normal limits. 4. Left peroneal and tibial motor responses show reduced amplitude. Of note, proximal peroneal motor response is greater when recording at the extensor digitorum brevis and there is a motor response at the medial malleolus, consistent with an accessory peroneal nerve. The peroneal motor response at the tibialis anterior is within normal limits. 5. Left tibial and ulnar F-wave responses are within normal limits. Left tibial H reflex study is absent. 6. Chronic motor axon loss changes are seen affecting the muscles below the knee and distal hand muscles, conforming to a gradient pattern.  Active denervation isolated to the left anterior tibialis muscle.  Fibrillation potentials are not present in the cervical and lumbosacral paraspinal muscles.  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  Overall, these findings are moderate in degree electrically.  Incidentally, there is a left accessory peroneal nerve, a normal variant.  ___________________________ Narda Amber, DO    Nerve Conduction Studies Anti Sensory Summary Table   Stim Site NR Peak (ms) Norm Peak (ms) P-T Amp (V) Norm P-T Amp  Left Median Anti Sensory (2nd Digit)  Wrist    2.8 <3.4 5.0 >20  Left Radial Anti Sensory (Base 1st Digit)  Wrist    2.0 <2.7 13.0 >18  Left Sup Peroneal Anti Sensory (Ant Lat Mall)  12 cm NR  <4.5  >5  Left Sural Anti Sensory (Lat Mall)  Calf NR  <4.5  >5  Left Ulnar Anti Sensory (5th Digit)  Wrist    2.5 <3.1 8.1 >12   Motor Summary Table   Stim Site NR Onset (ms) Norm Onset (ms) O-P Amp (mV) Norm O-P Amp Site1 Site2 Delta-0 (ms) Dist (cm) Vel (m/s) Norm Vel (m/s)  Left Median Motor (Abd Poll Brev)  Wrist    3.0 <3.9 7.7 >6 Elbow Wrist 4.2 24.0 57 >50  Elbow    7.2  7.5         Left Peroneal Motor (Ext Dig Brev)  Ankle    3.9 <5.5 1.0 >3 B Fib Ankle 8.3 34.0 41 >40  B Fib    12.2  1.4  Poplt B Fib 1.6 8.0 50 >40  Poplt    13.8  1.4         Medial malleolus    3.3  2.0         Left Peroneal TA Motor (Tib Ant)  Fib Head    2.8 <4.0 4.7 >4 Poplit Fib Head 1.3 8.0 62 >40  Poplit    4.1  3.2         Left Tibial Motor (Abd Hall Brev)  Ankle  3.9 <6.0 4.2 >8 Knee Ankle 7.4 37.0 50 >40  Knee    11.3  2.7         Left Ulnar Motor (Abd Dig Minimi)  Wrist    1.7 <3.1 10.6 >7 B Elbow Wrist 3.1 19.0 61 >50  B Elbow    4.8  10.1  A Elbow B Elbow 1.8 10.0 56 >50  A Elbow    6.6  9.7          F Wave Studies   NR F-Lat (ms) Lat Norm (ms) L-R F-Lat (ms)  Left Tibial (Mrkrs) (Abd Hallucis)     47.06 <55   Left Ulnar (Mrkrs) (Abd Dig Min)     23.60 <33    H Reflex Studies   NR H-Lat (ms) Lat Norm (ms) L-R H-Lat (ms)  Left Tibial (Gastroc)  NR  <35    EMG   Side Muscle Ins Act Fibs Psw Fasc Number Recrt Dur Dur. Amp Amp. Poly Poly. Comment  Left 1stDorInt Nml Nml Nml Nml 1- Rapid Few 1+ Few 1+ Nml Nml N/A  Left RectFemoris Nml Nml Nml Nml 1- Rapid Few 1+ Few 1+ Nml Nml N/A  Left AntTibialis Nml 2+ Nml Nml 2- Rapid Some 1+ Some 1+ Some 1+ N/A    Left Gastroc Nml Nml Nml Nml 2- Rapid Some 1+ Some 1+ Some 1+ N/A  Left Flex Dig Long Nml Nml Nml Nml 3- Rapid All 1+ All 1+ Nml Nml N/A  Left Cervical Parasp Low Nml Nml Nml Nml NE - - - - - - - N/A  Left Lumbo Parasp Low Nml Nml Nml Nml NE - - - - - - - N/A  Left Biceps Nml Nml Nml Nml Nml Nml Nml Nml Nml Nml Nml Nml N/A  Left Deltoid Nml Nml Nml Nml Nml Nml Nml Nml Nml Nml Nml Nml N/A  Left PronatorTeres Nml Nml Nml Nml Nml Nml Nml Nml Nml Nml Nml Nml N/A  Left Triceps Nml Nml Nml Nml Nml Nml Nml Nml Nml Nml Nml Nml N/A  Left Ext Indicis Nml Nml Nml Nml Nml Nml Nml Nml Nml Nml Nml Nml N/A  Left Abd Poll Brev Nml Nml Nml Nml Nml Nml Nml Nml Nml Nml Nml Nml N/A  Left BicepsFemS Nml Nml Nml Nml Nml Nml Nml Nml Nml Nml Nml Nml N/A  Left GluteusMed Nml Nml Nml Nml Nml Nml Nml Nml Nml Nml Nml Nml N/A      Waveforms:

## 2017-09-15 NOTE — Telephone Encounter (Signed)
I have spoken to patient to advise that she is due for labs this upcoming week and that she is scheduled for abdominal ultrasound at [facility] 10/27/17 at 9:00 am, NPO midnight. Patient verbalizes understanding of time/date/location/prep of appointments.

## 2017-09-17 ENCOUNTER — Telehealth: Payer: Self-pay | Admitting: *Deleted

## 2017-09-17 ENCOUNTER — Encounter: Payer: Self-pay | Admitting: *Deleted

## 2017-09-17 NOTE — Telephone Encounter (Signed)
Results sent via My Chart and return appointment is on Jan. 4 at 10: 53.

## 2017-09-17 NOTE — Telephone Encounter (Signed)
-----   Message from Alda Berthold, DO sent at 09/17/2017  1:57 PM EST ----- Please inform patient that her nerve testing shows progressive neuropathy affecting the hands and feet, most likely due to alcohol so it is very important to stop alcohol consumption. Continue thiamine 100 mg.  I would like her to follow-up with me in 2 months - please have her f/u in April moved up. Thanks.

## 2017-09-20 ENCOUNTER — Emergency Department (HOSPITAL_COMMUNITY): Payer: Medicaid Other

## 2017-09-20 ENCOUNTER — Inpatient Hospital Stay (HOSPITAL_COMMUNITY)
Admission: EM | Admit: 2017-09-20 | Discharge: 2017-09-28 | DRG: 871 | Disposition: A | Discharge status: Home / Self Care | Payer: Medicaid Other | Attending: Internal Medicine | Admitting: Internal Medicine

## 2017-09-20 ENCOUNTER — Encounter (HOSPITAL_COMMUNITY): Payer: Self-pay | Admitting: *Deleted

## 2017-09-20 DIAGNOSIS — M25571 Pain in right ankle and joints of right foot: Secondary | ICD-10-CM | POA: Diagnosis present

## 2017-09-20 DIAGNOSIS — Z79890 Hormone replacement therapy (postmenopausal): Secondary | ICD-10-CM | POA: Diagnosis not present

## 2017-09-20 DIAGNOSIS — Z8249 Family history of ischemic heart disease and other diseases of the circulatory system: Secondary | ICD-10-CM

## 2017-09-20 DIAGNOSIS — Z800 Family history of malignant neoplasm of digestive organs: Secondary | ICD-10-CM | POA: Diagnosis not present

## 2017-09-20 DIAGNOSIS — E876 Hypokalemia: Secondary | ICD-10-CM | POA: Diagnosis present

## 2017-09-20 DIAGNOSIS — K55039 Acute (reversible) ischemia of large intestine, extent unspecified: Secondary | ICD-10-CM | POA: Diagnosis present

## 2017-09-20 DIAGNOSIS — K7030 Alcoholic cirrhosis of liver without ascites: Secondary | ICD-10-CM | POA: Diagnosis present

## 2017-09-20 DIAGNOSIS — R6520 Severe sepsis without septic shock: Secondary | ICD-10-CM | POA: Diagnosis present

## 2017-09-20 DIAGNOSIS — M25579 Pain in unspecified ankle and joints of unspecified foot: Secondary | ICD-10-CM

## 2017-09-20 DIAGNOSIS — K219 Gastro-esophageal reflux disease without esophagitis: Secondary | ICD-10-CM | POA: Diagnosis present

## 2017-09-20 DIAGNOSIS — I959 Hypotension, unspecified: Secondary | ICD-10-CM | POA: Diagnosis present

## 2017-09-20 DIAGNOSIS — Z9049 Acquired absence of other specified parts of digestive tract: Secondary | ICD-10-CM | POA: Diagnosis not present

## 2017-09-20 DIAGNOSIS — D638 Anemia in other chronic diseases classified elsewhere: Secondary | ICD-10-CM | POA: Diagnosis present

## 2017-09-20 DIAGNOSIS — D649 Anemia, unspecified: Secondary | ICD-10-CM

## 2017-09-20 DIAGNOSIS — K529 Noninfective gastroenteritis and colitis, unspecified: Secondary | ICD-10-CM

## 2017-09-20 DIAGNOSIS — A419 Sepsis, unspecified organism: Secondary | ICD-10-CM | POA: Diagnosis present

## 2017-09-20 DIAGNOSIS — F17210 Nicotine dependence, cigarettes, uncomplicated: Secondary | ICD-10-CM | POA: Diagnosis present

## 2017-09-20 DIAGNOSIS — K589 Irritable bowel syndrome without diarrhea: Secondary | ICD-10-CM | POA: Diagnosis present

## 2017-09-20 DIAGNOSIS — L299 Pruritus, unspecified: Secondary | ICD-10-CM | POA: Diagnosis present

## 2017-09-20 DIAGNOSIS — R10813 Right lower quadrant abdominal tenderness: Secondary | ICD-10-CM

## 2017-09-20 DIAGNOSIS — F419 Anxiety disorder, unspecified: Secondary | ICD-10-CM | POA: Diagnosis present

## 2017-09-20 DIAGNOSIS — K7581 Nonalcoholic steatohepatitis (NASH): Secondary | ICD-10-CM | POA: Diagnosis present

## 2017-09-20 DIAGNOSIS — A0471 Enterocolitis due to Clostridium difficile, recurrent: Secondary | ICD-10-CM

## 2017-09-20 DIAGNOSIS — D61818 Other pancytopenia: Secondary | ICD-10-CM

## 2017-09-20 DIAGNOSIS — R110 Nausea: Secondary | ICD-10-CM

## 2017-09-20 DIAGNOSIS — R112 Nausea with vomiting, unspecified: Secondary | ICD-10-CM

## 2017-09-20 HISTORY — DX: Alcoholic cirrhosis of liver without ascites: K70.30

## 2017-09-20 LAB — URINALYSIS, ROUTINE W REFLEX MICROSCOPIC
BILIRUBIN URINE: NEGATIVE
GLUCOSE, UA: NEGATIVE mg/dL
HGB URINE DIPSTICK: NEGATIVE
KETONES UR: NEGATIVE mg/dL
LEUKOCYTES UA: NEGATIVE
NITRITE: NEGATIVE
PH: 8 (ref 5.0–8.0)
Protein, ur: 30 mg/dL — AB
Specific Gravity, Urine: 1.013 (ref 1.005–1.030)

## 2017-09-20 LAB — COMPREHENSIVE METABOLIC PANEL
ALT: 15 U/L (ref 14–54)
AST: 57 U/L — AB (ref 15–41)
Albumin: 3 g/dL — ABNORMAL LOW (ref 3.5–5.0)
Alkaline Phosphatase: 167 U/L — ABNORMAL HIGH (ref 38–126)
Anion gap: 11 (ref 5–15)
BILIRUBIN TOTAL: 3.9 mg/dL — AB (ref 0.3–1.2)
BUN: 8 mg/dL (ref 6–20)
CO2: 23 mmol/L (ref 22–32)
CREATININE: 1.17 mg/dL — AB (ref 0.44–1.00)
Calcium: 8.7 mg/dL — ABNORMAL LOW (ref 8.9–10.3)
Chloride: 103 mmol/L (ref 101–111)
GFR, EST NON AFRICAN AMERICAN: 55 mL/min — AB (ref >60)
Glucose, Bld: 107 mg/dL — ABNORMAL HIGH (ref 65–99)
POTASSIUM: 3.4 mmol/L — AB (ref 3.5–5.1)
Sodium: 137 mmol/L (ref 135–145)
TOTAL PROTEIN: 7.2 g/dL (ref 6.5–8.1)

## 2017-09-20 LAB — PREPARE RBC (CROSSMATCH)

## 2017-09-20 LAB — CBC
HCT: 18.8 % — ABNORMAL LOW (ref 36.0–46.0)
Hemoglobin: 6.2 g/dL — CL (ref 12.0–15.0)
MCH: 40.5 pg — ABNORMAL HIGH (ref 26.0–34.0)
MCHC: 33 g/dL (ref 30.0–36.0)
MCV: 122.9 fL — ABNORMAL HIGH (ref 78.0–100.0)
PLATELETS: 89 10*3/uL — AB (ref 150–400)
RBC: 1.53 MIL/uL — AB (ref 3.87–5.11)
RDW: 16.7 % — AB (ref 11.5–15.5)
WBC: 1.9 10*3/uL — AB (ref 4.0–10.5)

## 2017-09-20 LAB — I-STAT CG4 LACTIC ACID, ED
LACTIC ACID, VENOUS: 2.14 mmol/L — AB (ref 0.5–1.9)
Lactic Acid, Venous: 3.08 mmol/L (ref 0.5–1.9)

## 2017-09-20 LAB — LIPASE, BLOOD: Lipase: 39 U/L (ref 11–51)

## 2017-09-20 LAB — PROTIME-INR
INR: 1.73
PROTHROMBIN TIME: 20.1 s — AB (ref 11.4–15.2)

## 2017-09-20 MED ORDER — SODIUM CHLORIDE 0.9 % IV BOLUS (SEPSIS)
1000.0000 mL | Freq: Once | INTRAVENOUS | Status: AC
  Administered 2017-09-20: 1000 mL via INTRAVENOUS

## 2017-09-20 MED ORDER — FENTANYL CITRATE (PF) 100 MCG/2ML IJ SOLN
INTRAMUSCULAR | Status: AC
  Administered 2017-09-20: 50 ug
  Filled 2017-09-20: qty 2

## 2017-09-20 MED ORDER — FOLIC ACID 1 MG PO TABS
1.0000 mg | ORAL_TABLET | Freq: Every day | ORAL | Status: DC
  Administered 2017-09-21 – 2017-09-28 (×8): 1 mg via ORAL
  Filled 2017-09-20 (×8): qty 1

## 2017-09-20 MED ORDER — PIPERACILLIN-TAZOBACTAM 3.375 G IVPB
3.3750 g | Freq: Three times a day (TID) | INTRAVENOUS | Status: DC
  Administered 2017-09-20: 3.375 g via INTRAVENOUS
  Filled 2017-09-20: qty 50

## 2017-09-20 MED ORDER — PROMETHAZINE HCL 25 MG PO TABS: 25.0000 mg | ORAL_TABLET | Freq: Three times a day (TID) | ORAL | Status: DC | PRN

## 2017-09-20 MED ORDER — FENTANYL CITRATE (PF) 100 MCG/2ML IJ SOLN
50.0000 ug | INTRAMUSCULAR | Status: DC | PRN
  Administered 2017-09-21 (×4): 50 ug via INTRAVENOUS
  Filled 2017-09-20 (×4): qty 2

## 2017-09-20 MED ORDER — ONDANSETRON HCL 4 MG/2ML IJ SOLN
4.0000 mg | Freq: Once | INTRAMUSCULAR | Status: AC
  Administered 2017-09-20: 4 mg via INTRAVENOUS

## 2017-09-20 MED ORDER — BOOST / RESOURCE BREEZE PO LIQD
1.0000 | Freq: Three times a day (TID) | ORAL | Status: DC
  Administered 2017-09-21 – 2017-09-28 (×20): 1 via ORAL
  Filled 2017-09-20 (×2): qty 1

## 2017-09-20 MED ORDER — DEXTROSE 5 % IV SOLN
2.0000 g | INTRAVENOUS | Status: DC
  Administered 2017-09-21: 2 g via INTRAVENOUS
  Filled 2017-09-20: qty 2

## 2017-09-20 MED ORDER — DEXTROSE 5 % IV SOLN: 2.0000 g | Freq: Once | INTRAVENOUS | Status: DC

## 2017-09-20 MED ORDER — ACETAMINOPHEN 325 MG PO TABS
650.0000 mg | ORAL_TABLET | Freq: Four times a day (QID) | ORAL | Status: DC | PRN
  Administered 2017-09-21 – 2017-09-23 (×4): 650 mg via ORAL
  Filled 2017-09-20 (×5): qty 2

## 2017-09-20 MED ORDER — DEXTROSE 5 % IV SOLN: 2.0000 g | INTRAVENOUS | Status: DC

## 2017-09-20 MED ORDER — VITAMIN B-1 100 MG PO TABS
100.0000 mg | ORAL_TABLET | Freq: Every day | ORAL | Status: DC
  Administered 2017-09-21 – 2017-09-28 (×8): 100 mg via ORAL
  Filled 2017-09-20 (×8): qty 1

## 2017-09-20 MED ORDER — ONDANSETRON HCL 4 MG PO TABS: 4.0000 mg | ORAL_TABLET | Freq: Four times a day (QID) | ORAL | Status: DC | PRN

## 2017-09-20 MED ORDER — SODIUM CHLORIDE 0.9 % IV SOLN
Freq: Once | INTRAVENOUS | Status: AC
  Administered 2017-09-20: 15:00:00 via INTRAVENOUS

## 2017-09-20 MED ORDER — NICOTINE POLACRILEX 2 MG MT GUM: 4.0000 mg | CHEWING_GUM | Freq: Four times a day (QID) | OROMUCOSAL | Status: DC | PRN

## 2017-09-20 MED ORDER — ONDANSETRON HCL 4 MG/2ML IJ SOLN
INTRAMUSCULAR | Status: AC
  Administered 2017-09-20: 4 mg via INTRAVENOUS
  Filled 2017-09-20: qty 2

## 2017-09-20 MED ORDER — FENTANYL CITRATE (PF) 100 MCG/2ML IJ SOLN
50.0000 ug | Freq: Once | INTRAMUSCULAR | Status: AC
  Administered 2017-09-20: 50 ug via INTRAVENOUS
  Filled 2017-09-20: qty 2

## 2017-09-20 MED ORDER — PIPERACILLIN-TAZOBACTAM 3.375 G IVPB 30 MIN
3.3750 g | Freq: Once | INTRAVENOUS | Status: AC
Start: 2017-09-20 — End: 2017-09-20
  Administered 2017-09-20: 3.375 g via INTRAVENOUS
  Filled 2017-09-20: qty 50

## 2017-09-20 MED ORDER — SODIUM CHLORIDE 0.9 % IV BOLUS (SEPSIS)
500.0000 mL | Freq: Once | INTRAVENOUS | Status: AC
  Administered 2017-09-20: 500 mL via INTRAVENOUS

## 2017-09-20 MED ORDER — ACETAMINOPHEN 325 MG PO TABS
650.0000 mg | ORAL_TABLET | Freq: Once | ORAL | Status: AC
  Administered 2017-09-20: 650 mg via ORAL
  Filled 2017-09-20: qty 2

## 2017-09-20 MED ORDER — BARIUM SULFATE 2.1 % PO SUSP
ORAL | Status: AC
  Administered 2017-09-20: 21:00:00
  Filled 2017-09-20: qty 2

## 2017-09-20 MED ORDER — ONDANSETRON 4 MG PO TBDP
4.0000 mg | ORAL_TABLET | Freq: Once | ORAL | Status: AC | PRN
  Administered 2017-09-20: 4 mg via ORAL
  Filled 2017-09-20: qty 1

## 2017-09-20 MED ORDER — GABAPENTIN 300 MG PO CAPS
300.0000 mg | ORAL_CAPSULE | Freq: Every day | ORAL | Status: DC
  Administered 2017-09-21 – 2017-09-27 (×8): 300 mg via ORAL
  Filled 2017-09-20 (×8): qty 1

## 2017-09-20 MED ORDER — THIAMINE HCL 100 MG/ML IJ SOLN
Freq: Once | INTRAVENOUS | Status: AC
  Administered 2017-09-21: 01:00:00 via INTRAVENOUS
  Filled 2017-09-20: qty 1000

## 2017-09-20 MED ORDER — METRONIDAZOLE IN NACL 5-0.79 MG/ML-% IV SOLN
500.0000 mg | Freq: Three times a day (TID) | INTRAVENOUS | Status: DC
  Administered 2017-09-21 – 2017-09-23 (×8): 500 mg via INTRAVENOUS
  Filled 2017-09-20 (×11): qty 100

## 2017-09-20 MED ORDER — LEVOTHYROXINE SODIUM 75 MCG PO TABS
75.0000 ug | ORAL_TABLET | Freq: Every day | ORAL | Status: DC
  Administered 2017-09-21 – 2017-09-28 (×8): 75 ug via ORAL
  Filled 2017-09-20 (×9): qty 1

## 2017-09-20 MED ORDER — ONDANSETRON HCL 4 MG/2ML IJ SOLN
4.0000 mg | Freq: Four times a day (QID) | INTRAMUSCULAR | Status: DC | PRN
  Administered 2017-09-21 – 2017-09-27 (×3): 4 mg via INTRAVENOUS
  Filled 2017-09-20 (×4): qty 2

## 2017-09-20 NOTE — ED Triage Notes (Signed)
Pt has multiple complaints. Reports chills, sweating and jerking since last night with abd pain and n/v. Febrile at triage.

## 2017-09-20 NOTE — ED Notes (Signed)
Pt drinking oral contrast for CT.
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
## 2017-09-20 NOTE — ED Notes (Signed)
Pt reports RLQ pain with n/v/d x 4 days.  Reports hx of liver failure and low Hgb in the past.  Reports receiving 3 units of PRBC in the past.  Pt appears jaundice, reports pending MRI or CT of the liver.

## 2017-09-20 NOTE — ED Notes (Signed)
Received a call from the lab stating d/t pt having antibodies, it will take longer to have the blood ready.
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
## 2017-09-20 NOTE — Progress Notes (Signed)
Pharmacy Antibiotic Note  Jamie Allen is a 46 y.o. female admitted on 09/20/2017 with intra-abdominal infection.  Pharmacy has been consulted for Rocephin dosing.  Plan: Rocephin 2g IV Q24H.  Weight: 134 lb (60.8 kg)  Temp (24hrs), Avg:99.7 F (37.6 C), Min:98 F (36.7 C), Max:101.9 F (38.8 C)  Recent Labs  Lab 09/20/17 1405 09/20/17 1443 09/20/17 1616  WBC 1.9*  --   --   CREATININE 1.17*  --   --   LATICACIDVEN  --  3.08* 2.14*    Estimated Creatinine Clearance: 50.3 mL/min (A) (by C-G formula based on SCr of 1.17 mg/dL (H)).    Allergies  Allergen Reactions  . Iohexol Hives, Itching and Swelling  . Morphine And Related Itching    Can tolerate with Benadryl    Thank you for allowing pharmacy to be a part of this patient's care.  Wynona Neat, PharmD, BCPS  09/20/2017 11:11 PM

## 2017-09-20 NOTE — ED Notes (Signed)
First blood cx obtained at 1515, and second blood cx obtained at 1525

## 2017-09-20 NOTE — ED Notes (Signed)
I Stat Lactic Acid results shown to Dr. Laverta Baltimore

## 2017-09-21 ENCOUNTER — Other Ambulatory Visit (HOSPITAL_COMMUNITY): Payer: Self-pay

## 2017-09-21 DIAGNOSIS — R10813 Right lower quadrant abdominal tenderness: Secondary | ICD-10-CM

## 2017-09-21 LAB — CBC
HEMATOCRIT: 23.7 % — AB (ref 36.0–46.0)
HEMOGLOBIN: 7.7 g/dL — AB (ref 12.0–15.0)
MCH: 35 pg — ABNORMAL HIGH (ref 26.0–34.0)
MCHC: 32.5 g/dL (ref 30.0–36.0)
MCV: 107.7 fL — ABNORMAL HIGH (ref 78.0–100.0)
Platelets: 81 10*3/uL — ABNORMAL LOW (ref 150–400)
RBC: 2.2 MIL/uL — AB (ref 3.87–5.11)
WBC: 2.5 10*3/uL — AB (ref 4.0–10.5)

## 2017-09-21 LAB — CBC WITH DIFFERENTIAL/PLATELET
BLASTS: 0 %
Band Neutrophils: 0 %
Basophils Absolute: 0 10*3/uL (ref 0.0–0.1)
Basophils Relative: 1 %
EOS ABS: 0 10*3/uL (ref 0.0–0.7)
EOS PCT: 1 %
HEMATOCRIT: 24.2 % — AB (ref 36.0–46.0)
HEMOGLOBIN: 7.9 g/dL — AB (ref 12.0–15.0)
LYMPHS PCT: 17 %
Lymphs Abs: 0.4 10*3/uL — ABNORMAL LOW (ref 0.7–4.0)
MCH: 35.3 pg — AB (ref 26.0–34.0)
MCHC: 32.6 g/dL (ref 30.0–36.0)
MCV: 108 fL — ABNORMAL HIGH (ref 78.0–100.0)
MONOS PCT: 6 %
Metamyelocytes Relative: 0 %
Monocytes Absolute: 0.2 10*3/uL (ref 0.1–1.0)
Myelocytes: 0 %
Neutro Abs: 1.9 10*3/uL (ref 1.7–7.7)
Neutrophils Relative %: 75 %
OTHER: 0 %
PROMYELOCYTES ABS: 0 %
Platelets: 81 10*3/uL — ABNORMAL LOW (ref 150–400)
RBC: 2.24 MIL/uL — ABNORMAL LOW (ref 3.87–5.11)
WBC: 2.6 10*3/uL — AB (ref 4.0–10.5)
nRBC: 0 /100 WBC

## 2017-09-21 LAB — TYPE AND SCREEN
ABO/RH(D): O NEG
Antibody Screen: POSITIVE
DONOR AG TYPE: NEGATIVE
Donor AG Type: NEGATIVE
UNIT DIVISION: 0
Unit division: 0

## 2017-09-21 LAB — C DIFFICILE QUICK SCREEN W PCR REFLEX
C DIFFICLE (CDIFF) ANTIGEN: POSITIVE — AB
C Diff toxin: NEGATIVE

## 2017-09-21 LAB — DIFFERENTIAL
Basophils Absolute: 0 10*3/uL (ref 0.0–0.1)
Basophils Relative: 1 %
EOS ABS: 0 10*3/uL (ref 0.0–0.7)
EOS PCT: 1 %
LYMPHS PCT: 21 %
Lymphs Abs: 0.6 10*3/uL — ABNORMAL LOW (ref 0.7–4.0)
MONO ABS: 0.2 10*3/uL (ref 0.1–1.0)
Monocytes Relative: 7 %
NEUTROS PCT: 70 %
Neutro Abs: 1.9 10*3/uL (ref 1.7–7.7)

## 2017-09-21 LAB — BASIC METABOLIC PANEL
ANION GAP: 9 (ref 5–15)
BUN: 8 mg/dL (ref 6–20)
CHLORIDE: 106 mmol/L (ref 101–111)
CO2: 20 mmol/L — AB (ref 22–32)
Calcium: 7.5 mg/dL — ABNORMAL LOW (ref 8.9–10.3)
Creatinine, Ser: 1.14 mg/dL — ABNORMAL HIGH (ref 0.44–1.00)
GFR calc non Af Amer: 57 mL/min — ABNORMAL LOW (ref >60)
Glucose, Bld: 91 mg/dL (ref 65–99)
POTASSIUM: 3 mmol/L — AB (ref 3.5–5.1)
Sodium: 135 mmol/L (ref 135–145)

## 2017-09-21 LAB — CLOSTRIDIUM DIFFICILE BY PCR: CDIFFPCR: POSITIVE — AB

## 2017-09-21 LAB — BPAM RBC
BLOOD PRODUCT EXPIRATION DATE: 201901012359
Blood Product Expiration Date: 201901012359
ISSUE DATE / TIME: 201812021834
ISSUE DATE / TIME: 201812022246
UNIT TYPE AND RH: 9500
Unit Type and Rh: 9500

## 2017-09-21 MED ORDER — VANCOMYCIN 50 MG/ML ORAL SOLUTION
500.0000 mg | Freq: Four times a day (QID) | ORAL | Status: DC
  Administered 2017-09-21 – 2017-09-28 (×28): 500 mg via ORAL
  Filled 2017-09-21 (×33): qty 10

## 2017-09-21 MED ORDER — VANCOMYCIN HCL 500 MG IV SOLR: 500.0000 mg | Freq: Four times a day (QID) | Status: DC

## 2017-09-21 MED ORDER — ALUM & MAG HYDROXIDE-SIMETH 200-200-20 MG/5ML PO SUSP
30.0000 mL | ORAL | Status: DC | PRN
  Administered 2017-09-21 – 2017-09-26 (×4): 30 mL via ORAL
  Filled 2017-09-21 (×4): qty 30

## 2017-09-21 MED ORDER — POTASSIUM CHLORIDE CRYS ER 20 MEQ PO TBCR
40.0000 meq | EXTENDED_RELEASE_TABLET | Freq: Once | ORAL | Status: AC
  Administered 2017-09-21: 40 meq via ORAL
  Filled 2017-09-21: qty 2

## 2017-09-21 MED ORDER — PROMETHAZINE HCL 25 MG/ML IJ SOLN
12.5000 mg | Freq: Four times a day (QID) | INTRAMUSCULAR | Status: DC | PRN
Start: 2017-09-21 — End: 2017-09-22
  Administered 2017-09-21 – 2017-09-22 (×4): 12.5 mg via INTRAVENOUS
  Filled 2017-09-21 (×5): qty 1

## 2017-09-21 MED ORDER — IBUPROFEN 400 MG PO TABS: 600.0000 mg | ORAL_TABLET | Freq: Once | ORAL | Status: DC

## 2017-09-21 MED ORDER — HYDROMORPHONE HCL 1 MG/ML IJ SOLN
1.0000 mg | INTRAMUSCULAR | Status: DC | PRN
  Administered 2017-09-21 – 2017-09-22 (×8): 1 mg via INTRAVENOUS
  Filled 2017-09-21 (×8): qty 1

## 2017-09-21 MED ORDER — VANCOMYCIN 50 MG/ML ORAL SOLUTION: 500.0000 mg | Freq: Four times a day (QID) | ORAL | Status: DC

## 2017-09-21 MED ORDER — ACETAMINOPHEN 650 MG RE SUPP
650.0000 mg | RECTAL | Status: DC | PRN
  Administered 2017-09-21: 650 mg via RECTAL
  Filled 2017-09-21: qty 1

## 2017-09-21 NOTE — ED Notes (Addendum)
Previous note on incorrect chart

## 2017-09-21 NOTE — ED Notes (Signed)
Fentanyl 2mcg wasted, witnessed by Jaclyn Shaggy RN.  Was not able to do the waste on the pyxis.

## 2017-09-21 NOTE — Progress Notes (Signed)
PROGRESS NOTE    Dorthey Depace  IRJ:[id number] DOB: [birth year] DOA: 09/20/2017 PCP: Ladell Pier, MD   Brief Narrative: Journiee Feldkamp is a 46 y.o. female with a history of c diff, anemia, cirrhosis, alcohol abuse. She presented with abdominal pain with fever and is now found to have severe C. Difficile colitis infeciton   Assessment & Plan:   Principal Problem:   Sepsis associated hypotension ([place]) Active Problems:   Colitis presumed infectious   Alcoholic cirrhosis of liver without ascites (HCC)   Anemia of chronic disease   Severe sepsis Secondary to fulminant c diff colitis. Hypotensive initially and given fluids. -management below  Fulminant C difficile colitis infection PCR positive. Patient is severely symptomatic.  -Vancomycin 500 mg oral q 6 hours. Switch to enema if emesis recurs -Metronidazole 500 mg IV q 8 hours -Tylenol 650 mg q 6 hours PRN for fevers (PO or suppository) -start Dilaudid 1 mg q 4 hours for severe pain -Zofran and Phenergan prn for nausea and emesis  Alcoholic cirrhosis of live Stable.  Pancytopenia Patient with chronic anemia and thrombocytopenia secondary to liver disease. Leukopenia with no neutropenia. Likely secondary to acute infection. S/p 1 unit of PRBC. -repeat CBC  DVT prophylaxis: SCDs secondary to thrombocytopenia Code Status: Full code Family Communication: None at bedside Disposition Plan: When medically stable   Consultants:   None  Procedures:   None  Antimicrobials:  Zosyn  Ceftriaxone  Vancomycin  Metronidazole    Subjective: RLQ pain, nausea, chills  Objective: Vitals:   09/21/17 1007 09/21/17 1015 09/21/17 1025 09/21/17 1107  BP:      Pulse: 98 96    Resp: (!) 30 (!) 31    Temp:   (S) (!) 102.9 F (39.4 C) (S) (!) 100.9 F (38.3 C)  TempSrc:   Oral (S) Oral  SpO2: 95% 94%    Weight:        Intake/Output Summary (Last 24 hours) at 09/21/2017 1119 Last data filed at  09/21/2017 1010 Gross per 24 hour  Intake 3737.17 ml  Output 200 ml  Net 3537.17 ml   Filed Weights   09/20/17 1500  Weight: 60.8 kg (134 lb)    Examination:  General exam: appears in significant pain Respiratory system: Clear to auscultation. Respiratory effort normal. Cardiovascular system: S1 & S2 heard, Tachycardia with regular rhythm. No murmurs, rubs, gallops or clicks. Gastrointestinal system: Abdomen is nondistended, soft and tender in RLQ with no rebound. No organomegaly or masses felt. Normal bowel sounds heard. Central nervous system: Alert and oriented. No focal neurological deficits. Rigors. Extremities: No edema. No calf tenderness Skin: No cyanosis. No rashes Psychiatry: Judgement and insight appear normal. Mood & affect appropriate.     Data Reviewed: I have personally reviewed following labs and imaging studies  CBC: Recent Labs  Lab 09/20/17 1405 09/21/17 0404  WBC 1.9* 2.5*  NEUTROABS  --  1.9  HGB 6.2* 7.7*  HCT 18.8* 23.7*  MCV 122.9* 107.7*  PLT 89* 81*   Basic Metabolic Panel: Recent Labs  Lab 09/20/17 1405 09/21/17 0404  NA 137 135  K 3.4* 3.0*  CL 103 106  CO2 23 20*  GLUCOSE 107* 91  BUN 8 8  CREATININE 1.17* 1.14*  CALCIUM 8.7* 7.5*   GFR: Estimated Creatinine Clearance: 51.6 mL/min (A) (by C-G formula based on SCr of 1.14 mg/dL (H)). Liver Function Tests: Recent Labs  Lab 09/20/17 1405  AST 57*  ALT 15  ALKPHOS 167*  BILITOT  3.9*  PROT 7.2  ALBUMIN 3.0*   Recent Labs  Lab 09/20/17 1405  LIPASE 39   No results for input(s): AMMONIA in the last 168 hours. Coagulation Profile: Recent Labs  Lab 09/20/17 1521  INR 1.73   Cardiac Enzymes: No results for input(s): CKTOTAL, CKMB, CKMBINDEX, TROPONINI in the last 168 hours. BNP (last 3 results) No results for input(s): PROBNP in the last 8760 hours. HbA1C: No results for input(s): HGBA1C in the last 72 hours. CBG: No results for input(s): GLUCAP in the last 168  hours. Lipid Profile: No results for input(s): CHOL, HDL, LDLCALC, TRIG, CHOLHDL, LDLDIRECT in the last 72 hours. Thyroid Function Tests: No results for input(s): TSH, T4TOTAL, FREET4, T3FREE, THYROIDAB in the last 72 hours. Anemia Panel: No results for input(s): VITAMINB12, FOLATE, FERRITIN, TIBC, IRON, RETICCTPCT in the last 72 hours. Sepsis Labs: Recent Labs  Lab 09/20/17 1443 09/20/17 1616  LATICACIDVEN 3.08* 2.14*    Recent Results (from the past 240 hour(s))  Blood Culture (routine x 2)     Status: None (Preliminary result)   Collection Time: 09/20/17  3:15 PM  Result Value Ref Range Status   Specimen Description BLOOD LEFT ANTECUBITAL  Final   Special Requests   Final    BOTTLES DRAWN AEROBIC AND ANAEROBIC Blood Culture adequate volume   Culture NO GROWTH < 24 HOURS  Final   Report Status PENDING  Incomplete  Blood Culture (routine x 2)     Status: None (Preliminary result)   Collection Time: 09/20/17  3:25 PM  Result Value Ref Range Status   Specimen Description BLOOD LEFT HAND  Final   Special Requests   Final    BOTTLES DRAWN AEROBIC AND ANAEROBIC Blood Culture adequate volume   Culture NO GROWTH < 24 HOURS  Final   Report Status PENDING  Incomplete  C difficile quick scan w PCR reflex     Status: Abnormal   Collection Time: 09/20/17  3:25 PM  Result Value Ref Range Status   C Diff antigen POSITIVE (A) NEGATIVE Final   C Diff toxin NEGATIVE NEGATIVE Final   C Diff interpretation Results are indeterminate. See PCR results.  Final  Clostridium Difficile by PCR     Status: Abnormal   Collection Time: 09/20/17  3:25 PM  Result Value Ref Range Status   Toxigenic C Difficile by pcr POSITIVE (A) NEGATIVE Final    Comment: Positive for toxigenic C. difficile with little to no toxin production. Only treat if clinical presentation suggests symptomatic illness.         Radiology Studies: Ct Abdomen Pelvis Wo Contrast  Result Date: 09/20/2017 CLINICAL DATA:  Right  lower quadrant pain with nausea, vomiting, and diarrhea for 4 days. EXAM: CT ABDOMEN AND PELVIS WITHOUT CONTRAST TECHNIQUE: Multidetector CT imaging of the abdomen and pelvis was performed following the standard protocol without IV contrast. COMPARISON:  CT scan June 28, 2017 FINDINGS: Lower chest: No acute abnormality. Hepatobiliary: The liver is enlarged with a mildly nodular contour. No suspicious mass. Previous cholecystectomy. Pancreas: Unremarkable. No pancreatic ductal dilatation or surrounding inflammatory changes. Spleen: Stable splenomegaly. Adrenals/Urinary Tract: Adrenal glands are normal. The atrophic left kidney is stable. No renal stones, masses, or hydronephrosis. No perinephric stranding. The right ureter is normal with no stones. The bladder is unremarkable. Stomach/Bowel: The stomach is poorly evaluated due to lack of distention. The wall is mildly prominent, likely due to lack of distention. The small bowel is normal. Contrast is seen in the colon  to the level of the rectum. The colon is normal. The appendix is normal, best seen on coronal images. There is some stranding and probably a small amount of fluid in the right pericolic gutter which abuts the appendix. However, this does not appear to arise from the appendix. Vascular/Lymphatic: Minimal atherosclerotic change in the abdominal aorta. No aneurysm. No adenopathy. Reproductive: Uterus and bilateral adnexa are unremarkable. Other: There is some stranding and possibly a small amount of fluid in the right pericolic gutter extending towards midline such as on axial image 57 which is new since the previous study. An underlying cause is not identified. Adjacent structures are normal in appearance. Musculoskeletal: No acute or significant osseous findings. IMPRESSION: 1. There is some increased attenuation in the fat of the right pericolic gutter and probably a tiny amount of fluid extending towards midline which is new since the previous  study but is nonspecific. An underlying etiology is not seen. The appendix is adjacent to this finding but normal in appearance and there is no evidence of appendicitis on this study. 2. The liver is enlarged and mildly nodular.  Splenomegaly persists. 3. Mild atherosclerotic change. Aortic Atherosclerosis (ICD10-I70.0). Electronically Signed   By: Dorise Bullion III M.D   On: 09/20/2017 20:33   Dg Chest Port 1 View  Result Date: 09/20/2017 CLINICAL DATA:  Fever. EXAM: PORTABLE CHEST 1 VIEW COMPARISON:  06/30/2017 FINDINGS: The heart size and mediastinal contours are within normal limits. Both lungs are clear. The visualized skeletal structures are unremarkable. IMPRESSION: No active disease. Electronically Signed   By: Earle Gell M.D.   On: 09/20/2017 16:06        Scheduled Meds: . feeding supplement  1 Container Oral TID BM  . folic acid  1 mg Oral Daily  . gabapentin  300 mg Oral QHS  . levothyroxine  75 mcg Oral QAC breakfast  . thiamine  100 mg Oral Daily  . vancomycin  500 mg Oral Q6H   Continuous Infusions: . metronidazole Stopped (09/21/17 0951)     LOS: 1 day     Cordelia Poche, MD [facility] 09/21/2017, 11:19 AM Pager: [phone]  If 7PM-7AM, please contact night-coverage [email or web address] Password [email or web address] 09/21/2017, 11:19 AM

## 2017-09-21 NOTE — ED Notes (Signed)
Notified pharmacy for missing medications

## 2017-09-21 NOTE — ED Notes (Signed)
Pt able to tolerate oral medications. Pt report she had C.Diff over thanksgiving and feels that she never fully recovered.

## 2017-09-21 NOTE — ED Notes (Signed)
[place] [phone]-per RN @ 1100.

## 2017-09-21 NOTE — ED Notes (Signed)
Pt reports black spot in left eye, Nettey MD notified. Also notified of decreased tempeture.

## 2017-09-21 NOTE — ED Notes (Signed)
Per Nettey hold off on NG tube for now.

## 2017-09-21 NOTE — ED Notes (Signed)
Pt reports she does not want anything for lunch.

## 2017-09-22 ENCOUNTER — Encounter (HOSPITAL_COMMUNITY): Payer: Self-pay | Admitting: General Practice

## 2017-09-22 ENCOUNTER — Inpatient Hospital Stay (HOSPITAL_COMMUNITY): Payer: Medicaid Other

## 2017-09-22 ENCOUNTER — Other Ambulatory Visit: Payer: Self-pay

## 2017-09-22 DIAGNOSIS — M25571 Pain in right ankle and joints of right foot: Secondary | ICD-10-CM

## 2017-09-22 DIAGNOSIS — K7030 Alcoholic cirrhosis of liver without ascites: Secondary | ICD-10-CM

## 2017-09-22 HISTORY — DX: Alcoholic cirrhosis of liver without ascites: K70.30

## 2017-09-22 LAB — CBC
HCT: 25.8 % — ABNORMAL LOW (ref 36.0–46.0)
Hemoglobin: 8.5 g/dL — ABNORMAL LOW (ref 12.0–15.0)
MCH: 35.1 pg — ABNORMAL HIGH (ref 26.0–34.0)
MCHC: 32.9 g/dL (ref 30.0–36.0)
MCV: 106.6 fL — AB (ref 78.0–100.0)
PLATELETS: 74 10*3/uL — AB (ref 150–400)
RBC: 2.42 MIL/uL — ABNORMAL LOW (ref 3.87–5.11)
WBC: 3.8 10*3/uL — AB (ref 4.0–10.5)

## 2017-09-22 LAB — BASIC METABOLIC PANEL
Anion gap: 13 (ref 5–15)
BUN: 13 mg/dL (ref 6–20)
CO2: 19 mmol/L — ABNORMAL LOW (ref 22–32)
CREATININE: 1.25 mg/dL — AB (ref 0.44–1.00)
Calcium: 7.7 mg/dL — ABNORMAL LOW (ref 8.9–10.3)
Chloride: 101 mmol/L (ref 101–111)
GFR calc Af Amer: 59 mL/min — ABNORMAL LOW (ref >60)
GFR, EST NON AFRICAN AMERICAN: 51 mL/min — AB (ref >60)
Glucose, Bld: 89 mg/dL (ref 65–99)
Potassium: 3.9 mmol/L (ref 3.5–5.1)
SODIUM: 133 mmol/L — AB (ref 135–145)

## 2017-09-22 LAB — LACTIC ACID, PLASMA: Lactic Acid, Venous: 1.1 mmol/L (ref 0.5–1.9)

## 2017-09-22 LAB — MRSA PCR SCREENING: MRSA BY PCR: NEGATIVE

## 2017-09-22 MED ORDER — PROMETHAZINE HCL 25 MG/ML IJ SOLN
12.5000 mg | Freq: Four times a day (QID) | INTRAMUSCULAR | Status: DC | PRN
  Administered 2017-09-22: 12.5 mg via INTRAVENOUS
  Filled 2017-09-22: qty 1

## 2017-09-22 MED ORDER — DIPHENHYDRAMINE HCL 25 MG PO CAPS
25.0000 mg | ORAL_CAPSULE | Freq: Four times a day (QID) | ORAL | Status: DC | PRN
  Administered 2017-09-22 – 2017-09-24 (×2): 25 mg via ORAL
  Filled 2017-09-22 (×2): qty 1

## 2017-09-22 MED ORDER — OXYCODONE HCL 5 MG PO TABS
5.0000 mg | ORAL_TABLET | ORAL | Status: DC | PRN
  Administered 2017-09-23 – 2017-09-28 (×15): 5 mg via ORAL
  Filled 2017-09-22 (×16): qty 1

## 2017-09-22 NOTE — Progress Notes (Signed)
PROGRESS NOTE    Jamie Allen  CMK:[id number] DOB: 31-Aug-1971 DOA: 09/20/2017 PCP: Ladell Pier, MD   Brief Narrative: Jamie Allen is a 46 y.o. female with a history of c diff, anemia, cirrhosis, alcohol abuse. She presented with abdominal pain with fever and is now found to have severe C. Difficile colitis infeciton   Assessment & Plan:   Principal Problem:   Sepsis associated hypotension ([place]) Active Problems:   Colitis presumed infectious   Alcoholic cirrhosis of liver without ascites (HCC)   Anemia of chronic disease   Severe sepsis Present on admission. Secondary to fulminant c diff colitis. Hypotensive initially and given fluids. Improved from presentation -management below  Fulminant C difficile colitis infection PCR positive. Patient is severely symptomatic. -Continue Vancomycin 500 mg oral q 6 hours. Switch to enema if emesis recurs -Continue Metronidazole 500 mg IV q 8 hours -Tylenol 650 mg q 6 hours PRN for fevers (PO or suppository). Max 2000 mg daily -Continue Dilaudid 1 mg q 4 hours for severe pain -Zofran and Phenergan prn for nausea and emesis  Anxiety Patient prefers not to start pharmacotherapy. Drinking sips of juice appears to help.  Right ankle pain History of fracture s/p ORIF in 2016. Patient reports pain in the area since having rigors yesterday. Some tenderness to the area without evidence of infection. No bacteremia. -ankle x-ray  Alcoholic cirrhosis of live Stable.  Pancytopenia Patient with chronic anemia and thrombocytopenia secondary to liver disease. Leukopenia with no neutropenia. Likely secondary to acute infection. S/p 1 unit of PRBC. WBC count improved today. Hemoglobin stable.   DVT prophylaxis: SCDs secondary to thrombocytopenia Code Status: Full code Family Communication: None at bedside Disposition Plan: When medically stable   Consultants:   None  Procedures:   None  Antimicrobials:  Zosyn  (12/2)  Ceftriaxone (12/2)  Vancomycin oral (12/2>>  Metronidazole IV (12/2>>   Subjective: RLQ pain, nausea, chills  Objective: Vitals:   09/22/17 0730 09/22/17 0741 09/22/17 0800 09/22/17 0905  BP: (!) 111/59  103/60 133/77  Pulse: 92  91 (!) 108  Resp: 18  (!) 23 20  Temp:  98.7 F (37.1 C)  99.3 F (37.4 C)  TempSrc:  Oral  Oral  SpO2: 97%  97% 98%  Weight:    60.8 kg (134 lb)  Height:    5' 1"  (1.549 m)    Intake/Output Summary (Last 24 hours) at 09/22/2017 1143 Last data filed at 09/22/2017 0923 Gross per 24 hour  Intake 1300 ml  Output 2 ml  Net 1298 ml   Filed Weights   09/20/17 1500 09/22/17 0905  Weight: 60.8 kg (134 lb) 60.8 kg (134 lb)    Examination:  General exam: Comfortable Respiratory system: Clear to auscultation. Respiratory effort normal. Cardiovascular system: S1 & S2 heard, Regular rate with regular rhythm. No murmurs, rubs, gallops or clicks. Gastrointestinal system: Abdomen is nondistended, soft and mildly tender in RLQ with no rebound. No organomegaly or masses felt. Normal bowel sounds heard. Central nervous system: Alert and oriented. No focal neurological deficits. Extremities: No edema. No calf tenderness. Tenderness over right lateral ankle with fair range of motion (limited eversion) Skin: No cyanosis. No rashes Psychiatry: Judgement and insight appear normal. Anxious.    Data Reviewed: I have personally reviewed following labs and imaging studies  CBC: Recent Labs  Lab 09/20/17 1405 09/21/17 0404 09/21/17 0941 09/22/17 0700  WBC 1.9* 2.5* 2.6* 3.8*  NEUTROABS  --  1.9 1.9  --  HGB 6.2* 7.7* 7.9* 8.5*  HCT 18.8* 23.7* 24.2* 25.8*  MCV 122.9* 107.7* 108.0* 106.6*  PLT 89* 81* 81* 74*   Basic Metabolic Panel: Recent Labs  Lab 09/20/17 1405 09/21/17 0404 09/22/17 0700  NA 137 135 133*  K 3.4* 3.0* 3.9  CL 103 106 101  CO2 23 20* 19*  GLUCOSE 107* 91 89  BUN 8 8 13   CREATININE 1.17* 1.14* 1.25*  CALCIUM 8.7*  7.5* 7.7*   GFR: Estimated Creatinine Clearance: 47.1 mL/min (A) (by C-G formula based on SCr of 1.25 mg/dL (H)). Liver Function Tests: Recent Labs  Lab 09/20/17 1405  AST 57*  ALT 15  ALKPHOS 167*  BILITOT 3.9*  PROT 7.2  ALBUMIN 3.0*   Recent Labs  Lab 09/20/17 1405  LIPASE 39   No results for input(s): AMMONIA in the last 168 hours. Coagulation Profile: Recent Labs  Lab 09/20/17 1521  INR 1.73   Cardiac Enzymes: No results for input(s): CKTOTAL, CKMB, CKMBINDEX, TROPONINI in the last 168 hours. BNP (last 3 results) No results for input(s): PROBNP in the last 8760 hours. HbA1C: No results for input(s): HGBA1C in the last 72 hours. CBG: No results for input(s): GLUCAP in the last 168 hours. Lipid Profile: No results for input(s): CHOL, HDL, LDLCALC, TRIG, CHOLHDL, LDLDIRECT in the last 72 hours. Thyroid Function Tests: No results for input(s): TSH, T4TOTAL, FREET4, T3FREE, THYROIDAB in the last 72 hours. Anemia Panel: No results for input(s): VITAMINB12, FOLATE, FERRITIN, TIBC, IRON, RETICCTPCT in the last 72 hours. Sepsis Labs: Recent Labs  Lab 09/20/17 1443 09/20/17 1616 09/22/17 0700  LATICACIDVEN 3.08* 2.14* 1.1    Recent Results (from the past 240 hour(s))  Blood Culture (routine x 2)     Status: None (Preliminary result)   Collection Time: 09/20/17  3:15 PM  Result Value Ref Range Status   Specimen Description BLOOD LEFT ANTECUBITAL  Final   Special Requests   Final    BOTTLES DRAWN AEROBIC AND ANAEROBIC Blood Culture adequate volume   Culture NO GROWTH < 24 HOURS  Final   Report Status PENDING  Incomplete  Blood Culture (routine x 2)     Status: None (Preliminary result)   Collection Time: 09/20/17  3:25 PM  Result Value Ref Range Status   Specimen Description BLOOD LEFT HAND  Final   Special Requests   Final    BOTTLES DRAWN AEROBIC AND ANAEROBIC Blood Culture adequate volume   Culture NO GROWTH < 24 HOURS  Final   Report Status PENDING   Incomplete  C difficile quick scan w PCR reflex     Status: Abnormal   Collection Time: 09/20/17  3:25 PM  Result Value Ref Range Status   C Diff antigen POSITIVE (A) NEGATIVE Final   C Diff toxin NEGATIVE NEGATIVE Final   C Diff interpretation Results are indeterminate. See PCR results.  Final  Clostridium Difficile by PCR     Status: Abnormal   Collection Time: 09/20/17  3:25 PM  Result Value Ref Range Status   Toxigenic C Difficile by pcr POSITIVE (A) NEGATIVE Final    Comment: Positive for toxigenic C. difficile with little to no toxin production. Only treat if clinical presentation suggests symptomatic illness.         Radiology Studies: Ct Abdomen Pelvis Wo Contrast  Result Date: 09/20/2017 CLINICAL DATA:  Right lower quadrant pain with nausea, vomiting, and diarrhea for 4 days. EXAM: CT ABDOMEN AND PELVIS WITHOUT CONTRAST TECHNIQUE: Multidetector CT imaging of  the abdomen and pelvis was performed following the standard protocol without IV contrast. COMPARISON:  CT scan June 28, 2017 FINDINGS: Lower chest: No acute abnormality. Hepatobiliary: The liver is enlarged with a mildly nodular contour. No suspicious mass. Previous cholecystectomy. Pancreas: Unremarkable. No pancreatic ductal dilatation or surrounding inflammatory changes. Spleen: Stable splenomegaly. Adrenals/Urinary Tract: Adrenal glands are normal. The atrophic left kidney is stable. No renal stones, masses, or hydronephrosis. No perinephric stranding. The right ureter is normal with no stones. The bladder is unremarkable. Stomach/Bowel: The stomach is poorly evaluated due to lack of distention. The wall is mildly prominent, likely due to lack of distention. The small bowel is normal. Contrast is seen in the colon to the level of the rectum. The colon is normal. The appendix is normal, best seen on coronal images. There is some stranding and probably a small amount of fluid in the right pericolic gutter which abuts the  appendix. However, this does not appear to arise from the appendix. Vascular/Lymphatic: Minimal atherosclerotic change in the abdominal aorta. No aneurysm. No adenopathy. Reproductive: Uterus and bilateral adnexa are unremarkable. Other: There is some stranding and possibly a small amount of fluid in the right pericolic gutter extending towards midline such as on axial image 57 which is new since the previous study. An underlying cause is not identified. Adjacent structures are normal in appearance. Musculoskeletal: No acute or significant osseous findings. IMPRESSION: 1. There is some increased attenuation in the fat of the right pericolic gutter and probably a tiny amount of fluid extending towards midline which is new since the previous study but is nonspecific. An underlying etiology is not seen. The appendix is adjacent to this finding but normal in appearance and there is no evidence of appendicitis on this study. 2. The liver is enlarged and mildly nodular.  Splenomegaly persists. 3. Mild atherosclerotic change. Aortic Atherosclerosis (ICD10-I70.0). Electronically Signed   By: Dorise Bullion III M.D   On: 09/20/2017 20:33   Dg Chest Port 1 View  Result Date: 09/20/2017 CLINICAL DATA:  Fever. EXAM: PORTABLE CHEST 1 VIEW COMPARISON:  06/30/2017 FINDINGS: The heart size and mediastinal contours are within normal limits. Both lungs are clear. The visualized skeletal structures are unremarkable. IMPRESSION: No active disease. Electronically Signed   By: Earle Gell M.D.   On: 09/20/2017 16:06        Scheduled Meds: . feeding supplement  1 Container Oral TID BM  . folic acid  1 mg Oral Daily  . gabapentin  300 mg Oral QHS  . levothyroxine  75 mcg Oral QAC breakfast  . thiamine  100 mg Oral Daily  . vancomycin  500 mg Oral Q6H   Continuous Infusions: . metronidazole Stopped (09/22/17 1054)     LOS: 2 days     Cordelia Poche, MD [facility] 09/22/2017, 11:43 AM Pager: [phone]  If 7PM-7AM, please contact night-coverage [email or web address] Password [email or web address] 09/22/2017, 11:43 AM

## 2017-09-23 DIAGNOSIS — A419 Sepsis, unspecified organism: Principal | ICD-10-CM

## 2017-09-23 DIAGNOSIS — I959 Hypotension, unspecified: Secondary | ICD-10-CM

## 2017-09-23 DIAGNOSIS — K7030 Alcoholic cirrhosis of liver without ascites: Secondary | ICD-10-CM

## 2017-09-23 DIAGNOSIS — A0471 Enterocolitis due to Clostridium difficile, recurrent: Secondary | ICD-10-CM

## 2017-09-23 LAB — BASIC METABOLIC PANEL
Anion gap: 12 (ref 5–15)
BUN: 7 mg/dL (ref 6–20)
CALCIUM: 7.7 mg/dL — AB (ref 8.9–10.3)
CO2: 18 mmol/L — AB (ref 22–32)
CREATININE: 1.03 mg/dL — AB (ref 0.44–1.00)
Chloride: 103 mmol/L (ref 101–111)
GFR calc non Af Amer: >60 mL/min (ref >60)
Glucose, Bld: 123 mg/dL — ABNORMAL HIGH (ref 65–99)
Potassium: 2.8 mmol/L — ABNORMAL LOW (ref 3.5–5.1)
SODIUM: 133 mmol/L — AB (ref 135–145)

## 2017-09-23 LAB — CBC
HCT: 22.3 % — ABNORMAL LOW (ref 36.0–46.0)
Hemoglobin: 7.5 g/dL — ABNORMAL LOW (ref 12.0–15.0)
MCH: 35.7 pg — AB (ref 26.0–34.0)
MCHC: 33.6 g/dL (ref 30.0–36.0)
MCV: 106.2 fL — AB (ref 78.0–100.0)
PLATELETS: 78 10*3/uL — AB (ref 150–400)
RBC: 2.1 MIL/uL — ABNORMAL LOW (ref 3.87–5.11)
WBC: 3.3 10*3/uL — AB (ref 4.0–10.5)

## 2017-09-23 LAB — MAGNESIUM: MAGNESIUM: 1.5 mg/dL — AB (ref 1.7–2.4)

## 2017-09-23 MED ORDER — POTASSIUM CHLORIDE CRYS ER 20 MEQ PO TBCR
40.0000 meq | EXTENDED_RELEASE_TABLET | Freq: Every day | ORAL | Status: DC
  Administered 2017-09-24 – 2017-09-25 (×2): 40 meq via ORAL
  Filled 2017-09-23 (×3): qty 2

## 2017-09-23 MED ORDER — POTASSIUM CHLORIDE CRYS ER 20 MEQ PO TBCR
40.0000 meq | EXTENDED_RELEASE_TABLET | Freq: Once | ORAL | Status: DC
  Administered 2017-09-23: 40 meq via ORAL

## 2017-09-23 MED ORDER — HYDROMORPHONE HCL 1 MG/ML IJ SOLN
0.5000 mg | INTRAMUSCULAR | Status: DC | PRN
  Administered 2017-09-23: 0.5 mg via INTRAVENOUS
  Administered 2017-09-23 – 2017-09-24 (×5): 1 mg via INTRAVENOUS
  Administered 2017-09-24: 0.5 mg via INTRAVENOUS
  Administered 2017-09-24 (×2): 1 mg via INTRAVENOUS
  Administered 2017-09-25: 0.5 mg via INTRAVENOUS
  Administered 2017-09-25 – 2017-09-26 (×8): 1 mg via INTRAVENOUS
  Filled 2017-09-23 (×19): qty 1

## 2017-09-23 MED ORDER — METRONIDAZOLE IN NACL 5-0.79 MG/ML-% IV SOLN
500.0000 mg | Freq: Three times a day (TID) | INTRAVENOUS | Status: DC
  Administered 2017-09-23 – 2017-09-26 (×9): 500 mg via INTRAVENOUS
  Filled 2017-09-23 (×10): qty 100

## 2017-09-23 MED ORDER — MAGNESIUM SULFATE 2 GM/50ML IV SOLN
2.0000 g | Freq: Once | INTRAVENOUS | Status: AC
  Administered 2017-09-23: 2 g via INTRAVENOUS
  Filled 2017-09-23: qty 50

## 2017-09-23 MED ORDER — POTASSIUM CHLORIDE 10 MEQ/100ML IV SOLN
10.0000 meq | INTRAVENOUS | Status: AC
  Administered 2017-09-23 (×2): 10 meq via INTRAVENOUS
  Filled 2017-09-23: qty 100

## 2017-09-23 MED ORDER — POTASSIUM CHLORIDE 10 MEQ/100ML IV SOLN
INTRAVENOUS | Status: AC
  Filled 2017-09-23: qty 100

## 2017-09-23 NOTE — Progress Notes (Signed)
Initial Nutrition Assessment  DOCUMENTATION CODES:   Not applicable  INTERVENTION:    Continue Boost Breeze po TID, each supplement provides 250 kcal and 9 grams of protein  NUTRITION DIAGNOSIS:   Inadequate oral intake related to acute illness as evidenced by per patient/family report.  GOAL:   Patient will meet greater than or equal to 90% of their needs  MONITOR:   PO intake, Supplement acceptance  REASON FOR ASSESSMENT:   Malnutrition Screening Tool, Consult Assessment of nutrition requirement/status  ASSESSMENT:   46 yo female with PMH of cirrhosis, alcohol abuse, immune deficiency disorder, GERD, anxiety, PTSD, C diff colitis twice in 2015, chronic cholecystitis s/p lap chole who was admitted on 12/2 with abdominal pain and fever due to C diff colitis.  Patient reports that she has been eating poorly for the past year due to GI distress associated with multiple episodes of C diff colitis. This is her third time having C diff colitis. Usual weight ~115 lbs, now weight is up d/t fluid retention, she says. She states that she is lactose intolerant because when she drinks milk it curdles in her stomach and then she vomits curdled milk. She is drinking Gatorade (to help replace electrolytes) and Boost Breeze supplements. She also states that she is malnourished. She reports eating for a day, then going 4 days without eating or drinking anything. Nutrition focused physical exam revealed no muscle or subcutaneous fat depletion. Patient is at nutrition risk due to reported poor intake, although, suspect the extent of reported intake was exaggerated a bit.  Labs and medications reviewed. Sodium 133 (L), potassium 2.8 (L), magnesium 1.5 (L) Receiving potassium chloride tablet.  NUTRITION - FOCUSED PHYSICAL EXAM:    Most Recent Value  Orbital Region  No depletion  Upper Arm Region  No depletion  Thoracic and Lumbar Region  No depletion  Buccal Region  No depletion  Temple  Region  No depletion  Clavicle Bone Region  No depletion  Clavicle and Acromion Bone Region  No depletion  Scapular Bone Region  No depletion  Dorsal Hand  No depletion  Patellar Region  No depletion  Anterior Thigh Region  No depletion  Posterior Calf Region  No depletion  Edema (RD Assessment)  None  Hair  Reviewed  Eyes  Reviewed  Mouth  Reviewed  Skin  Reviewed  Nails  Reviewed       Diet Order:  Diet regular Room service appropriate? Yes; Fluid consistency: Thin  EDUCATION NEEDS:   Education needs have been addressed(discussed eating soft, bland foods while having GI distress.)  Skin:  Skin Assessment: Reviewed RN Assessment  Last BM:  12/5 per patient  Height:   Ht Readings from Last 1 Encounters:  09/22/17 5' 1"  (1.549 m)    Weight:   Wt Readings from Last 1 Encounters:  09/22/17 134 lb (60.8 kg)    Ideal Body Weight:  47.7 kg  BMI:  Body mass index is 25.32 kg/m.  Estimated Nutritional Needs:   Kcal:  1550-1750  Protein:  70-80 gm  Fluid:  1.7 L   Molli Barrows, RD, LDN, [place] Pager [phone] After Hours Pager [phone]

## 2017-09-23 NOTE — Progress Notes (Addendum)
[facility]                                                                              Patient Demographics  Jamie Allen, is a 46 y.o. female, DOB - December 22, 1970, DVO:[id number]  Admit date - 09/20/2017   Admitting Physician Etta Quill, DO  Outpatient Primary MD for the patient is Ladell Pier, MD  Outpatient specialists:   LOS - 3  days   Medical records reviewed and are as summarized below:    Chief Complaint  Patient presents with  . Emesis       Brief summary   Jamie Allen is a 46 y.o. female with a history of c diff, anemia, cirrhosis, alcohol abuse. She presented with abdominal pain with fever and is now found to have severe C. Difficile colitis    Assessment & Plan    Principal Problem:   Sepsis associated hypotension ([place]) -Patient met sepsis criteria at the time of admission with hypotension, tachycardia, acute C. difficile fulminant colitis.  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 -ID consult obtained, no improvement since admission.  Discussed with Dr. Johnnye Sima    Alcoholic cirrhosis of liver without ascites ([place]) -Currently stable, no acute issues    Anemia of chronic disease, pancytopenia -Likely due to liver disease, status post 1 unit packed RBCs -Follow counts closely  Right ankle pain -History of fracture status post ORIF in 2016, -Right ankle x-ray on 12/4 showed no acute osseous abnormality, prior medial and lateral malleolus ORIF without evidence of hardware complication   Hypokalemia, hypomagnesemia -Replaced  Code Status: full  DVT Prophylaxis:  SCD's Family Communication: Discussed in detail with the patient, all imaging results, lab results explained to the patient   Disposition Plan:   Time Spent in minutes   25  minutes  Procedures:    Consultants:   Infectious disease, Dr. Johnnye Sima  Antimicrobials:      Medications  Scheduled Meds: . feeding supplement  1 Container Oral TID BM  . folic acid  1 mg Oral Daily  . gabapentin  300 mg Oral QHS  . levothyroxine  75 mcg Oral QAC breakfast  . potassium chloride  40 mEq Oral Daily  . thiamine  100 mg Oral Daily  . vancomycin  500 mg Oral Q6H   Continuous Infusions: . metronidazole Stopped (09/23/17 0130)  . potassium chloride 10 mEq (09/23/17 1127)   PRN Meds:.acetaminophen, alum & mag hydroxide-simeth, diphenhydrAMINE, nicotine polacrilex, ondansetron **OR** ondansetron (ZOFRAN) IV, oxyCODONE, promethazine, promethazine   Antibiotics   Anti-infectives (From admission, onward)   Start     Dose/Rate Route Frequency Ordered Stop   09/21/17 2200  cefTRIAXone (ROCEPHIN) 2 g in dextrose 5 % 50 mL IVPB  Status:  Discontinued     2 g 100 mL/hr over 30 Minutes Intravenous Every 24 hours 09/20/17 2314 09/20/17 2328   09/21/17 1300  vancomycin (VANCOCIN) 50 mg/mL oral solution 500 mg     500 mg Oral Every  6 hours 09/21/17 1110     09/21/17 1200  vancomycin (VANCOCIN) 50 mg/mL oral solution 500 mg  Status:  Discontinued     500 mg Oral Every 6 hours 09/21/17 0857 09/21/17 0925   09/21/17 1200  vancomycin (VANCOCIN) 500 mg in sodium chloride irrigation 0.9 % 100 mL ENEMA  Status:  Discontinued     500 mg Rectal Every 6 hours 09/21/17 0925 09/21/17 1110   09/21/17 0600  cefTRIAXone (ROCEPHIN) 2 g in dextrose 5 % 50 mL IVPB  Status:  Discontinued     2 g 100 mL/hr over 30 Minutes Intravenous Every 24 hours 09/20/17 2328 09/21/17 0857   09/21/17 0000  metroNIDAZOLE (FLAGYL) IVPB 500 mg     500 mg 100 mL/hr over 60 Minutes Intravenous Every 8 hours 09/20/17 2305     09/20/17 2315  cefTRIAXone (ROCEPHIN) 2 g in dextrose 5 % 50 mL IVPB  Status:  Discontinued     2 g 100 mL/hr over 30 Minutes Intravenous  Once 09/20/17 2314 09/21/17 0857   09/20/17  2200  piperacillin-tazobactam (ZOSYN) IVPB 3.375 g  Status:  Discontinued     3.375 g 12.5 mL/hr over 240 Minutes Intravenous Every 8 hours 09/20/17 1537 09/20/17 2305   09/20/17 1530  piperacillin-tazobactam (ZOSYN) IVPB 3.375 g     3.375 g 100 mL/hr over 30 Minutes Intravenous  Once 09/20/17 1521 09/20/17 1646        Subjective:   Jamie Allen was seen and examined today.  Miserable with a continuous diarrhea "whole night", fevers 102.4 overnight.  Patient denies dizziness, chest pain, shortness of breath, abdominal pain, N/V, new weakness, numbess, tingling. No acute events overnight.    Objective:   Vitals:   09/22/17 2049 09/23/17 0031 09/23/17 0551 09/23/17 0823  BP: 110/64 126/60 105/63 102/60  Pulse: (!) 102 (!) 108 87 91  Resp: (!) _0 Temp: 98.5 F (36.9 C) (!) 102.4 F (39.1 C) 97.6 F (36.4 C) 98.8 F (37.1 C)  TempSrc: Oral Oral Oral Oral  SpO2: 96% 95% 98%   Weight:      Height:        Intake/Output Summary (Last 24 hours) at 09/23/2017 1131 Last data filed at 09/23/2017 0825 Gross per 24 hour  Intake 1037 ml  Output 300 ml  Net 737 ml     Wt Readings from Last 3 Encounters:  09/22/17 60.8 kg (134 lb)  09/02/17 61 kg (134 lb 6.4 oz)  08/21/17 60.8 kg (134 lb 2 oz)     Exam  General: Alert and oriented x 3, NAD, feels miserable  Eyes:   HEENT:  Cardiovascular: S1 S2 clear,. Regular rate and rhythm.  Respiratory: Clear to auscultation bilaterally, no wheezing, rales or rhonchi  Gastrointestinal: Soft, mild diffuse TTP, nondistended, + bowel sounds  Ext: no pedal edema bilaterally  Neuro: no new   Musculoskeletal: No digital cyanosis, clubbing  Skin: No rashes  Psych: Normal affect and demeanor, alert and oriented x3    Data Reviewed:  I have personally reviewed following labs and imaging studies  Micro Results Recent Results (from the past 240 hour(s))  Blood Culture (routine x 2)     Status: None (Preliminary result)    Collection Time: 09/20/17  3:15 PM  Result Value Ref Range Status   Specimen Description BLOOD LEFT ANTECUBITAL  Final   Special Requests   Final    BOTTLES DRAWN AEROBIC AND ANAEROBIC Blood Culture adequate volume  Culture NO GROWTH 2 DAYS  Final   Report Status PENDING  Incomplete  Blood Culture (routine x 2)     Status: None (Preliminary result)   Collection Time: 09/20/17  3:25 PM  Result Value Ref Range Status   Specimen Description BLOOD LEFT HAND  Final   Special Requests   Final    BOTTLES DRAWN AEROBIC AND ANAEROBIC Blood Culture adequate volume   Culture NO GROWTH 2 DAYS  Final   Report Status PENDING  Incomplete  C difficile quick scan w PCR reflex     Status: Abnormal   Collection Time: 09/20/17  3:25 PM  Result Value Ref Range Status   C Diff antigen POSITIVE (A) NEGATIVE Final   C Diff toxin NEGATIVE NEGATIVE Final   C Diff interpretation Results are indeterminate. See PCR results.  Final  Clostridium Difficile by PCR     Status: Abnormal   Collection Time: 09/20/17  3:25 PM  Result Value Ref Range Status   Toxigenic C Difficile by pcr POSITIVE (A) NEGATIVE Final    Comment: Positive for toxigenic C. difficile with little to no toxin production. Only treat if clinical presentation suggests symptomatic illness.  MRSA PCR Screening     Status: None   Collection Time: 09/22/17 11:01 AM  Result Value Ref Range Status   MRSA by PCR NEGATIVE NEGATIVE Final    Comment:        The GeneXpert MRSA Assay (FDA approved for NASAL specimens only), is one component of a comprehensive MRSA colonization surveillance program. It is not intended to diagnose MRSA infection nor to guide or monitor treatment for MRSA infections.     Radiology Reports Ct Abdomen Pelvis Wo Contrast  Result Date: 09/20/2017 CLINICAL DATA:  Right lower quadrant pain with nausea, vomiting, and diarrhea for 4 days. EXAM: CT ABDOMEN AND PELVIS WITHOUT CONTRAST TECHNIQUE: Multidetector CT imaging of  the abdomen and pelvis was performed following the standard protocol without IV contrast. COMPARISON:  CT scan June 28, 2017 FINDINGS: Lower chest: No acute abnormality. Hepatobiliary: The liver is enlarged with a mildly nodular contour. No suspicious mass. Previous cholecystectomy. Pancreas: Unremarkable. No pancreatic ductal dilatation or surrounding inflammatory changes. Spleen: Stable splenomegaly. Adrenals/Urinary Tract: Adrenal glands are normal. The atrophic left kidney is stable. No renal stones, masses, or hydronephrosis. No perinephric stranding. The right ureter is normal with no stones. The bladder is unremarkable. Stomach/Bowel: The stomach is poorly evaluated due to lack of distention. The wall is mildly prominent, likely due to lack of distention. The small bowel is normal. Contrast is seen in the colon to the level of the rectum. The colon is normal. The appendix is normal, best seen on coronal images. There is some stranding and probably a small amount of fluid in the right pericolic gutter which abuts the appendix. However, this does not appear to arise from the appendix. Vascular/Lymphatic: Minimal atherosclerotic change in the abdominal aorta. No aneurysm. No adenopathy. Reproductive: Uterus and bilateral adnexa are unremarkable. Other: There is some stranding and possibly a small amount of fluid in the right pericolic gutter extending towards midline such as on axial image 57 which is new since the previous study. An underlying cause is not identified. Adjacent structures are normal in appearance. Musculoskeletal: No acute or significant osseous findings. IMPRESSION: 1. There is some increased attenuation in the fat of the right pericolic gutter and probably a tiny amount of fluid extending towards midline which is new since the previous study but is nonspecific.  An underlying etiology is not seen. The appendix is adjacent to this finding but normal in appearance and there is no evidence of  appendicitis on this study. 2. The liver is enlarged and mildly nodular.  Splenomegaly persists. 3. Mild atherosclerotic change. Aortic Atherosclerosis (ICD10-I70.0). Electronically Signed   By: Dorise Bullion III M.D   On: 09/20/2017 20:33   Dg Ankle Complete Right  Result Date: 09/22/2017 CLINICAL DATA:  Right ankle pain.  No injury. EXAM: RIGHT ANKLE - COMPLETE 3+ VIEW COMPARISON:  Right ankle x-rays dated October 07, 2015. FINDINGS: No acute fracture or malalignment. Postsurgical changes related to prior medial and lateral malleolar ORIF without evidence of hardware failure or loosening. The talar dome is intact. The ankle mortise is symmetric. The bones are osteopenic. Probable small tibiotalar joint effusion. Soft tissues are unremarkable. IMPRESSION: 1.  No acute osseous abnormality. 2. Prior medial and lateral malleolar ORIF without evidence of hardware complication. Electronically Signed   By: Titus Dubin M.D.   On: 09/22/2017 12:46   Dg Chest Port 1 View  Result Date: 09/20/2017 CLINICAL DATA:  Fever. EXAM: PORTABLE CHEST 1 VIEW COMPARISON:  06/30/2017 FINDINGS: The heart size and mediastinal contours are within normal limits. Both lungs are clear. The visualized skeletal structures are unremarkable. IMPRESSION: No active disease. Electronically Signed   By: Earle Gell M.D.   On: 09/20/2017 16:06    Lab Data:  CBC: Recent Labs  Lab 09/20/17 1405 09/21/17 0404 09/21/17 0941 09/22/17 0700 09/23/17 0357  WBC 1.9* 2.5* 2.6* 3.8* 3.3*  NEUTROABS  --  1.9 1.9  --   --   HGB 6.2* 7.7* 7.9* 8.5* 7.5*  HCT 18.8* 23.7* 24.2* 25.8* 22.3*  MCV 122.9* 107.7* 108.0* 106.6* 106.2*  PLT 89* 81* 81* 74* 78*   Basic Metabolic Panel: Recent Labs  Lab 09/20/17 1405 09/21/17 0404 09/22/17 0700 09/23/17 0357 09/23/17 0806  NA 137 135 133* 133*  --   K 3.4* 3.0* 3.9 2.8*  --   CL 103 106 101 103  --   CO2 23 20* 19* 18*  --   GLUCOSE 107* 91 89 123*  --   BUN _0 --     CREATININE 1.17* 1.14* 1.25* 1.03*  --   CALCIUM 8.7* 7.5* 7.7* 7.7*  --   MG  --   --   --   --  1.5*   GFR: Estimated Creatinine Clearance: 57.1 mL/min (A) (by C-G formula based on SCr of 1.03 mg/dL (H)). Liver Function Tests: Recent Labs  Lab 09/20/17 1405  AST 57*  ALT 15  ALKPHOS 167*  BILITOT 3.9*  PROT 7.2  ALBUMIN 3.0*   Recent Labs  Lab 09/20/17 1405  LIPASE 39   No results for input(s): AMMONIA in the last 168 hours. Coagulation Profile: Recent Labs  Lab 09/20/17 1521  INR 1.73   Cardiac Enzymes: No results for input(s): CKTOTAL, CKMB, CKMBINDEX, TROPONINI in the last 168 hours. BNP (last 3 results) No results for input(s): PROBNP in the last 8760 hours. HbA1C: No results for input(s): HGBA1C in the last 72 hours. CBG: No results for input(s): GLUCAP in the last 168 hours. Lipid Profile: No results for input(s): CHOL, HDL, LDLCALC, TRIG, CHOLHDL, LDLDIRECT in the last 72 hours. Thyroid Function Tests: No results for input(s): TSH, T4TOTAL, FREET4, T3FREE, THYROIDAB in the last 72 hours. Anemia Panel: No results for input(s): VITAMINB12, FOLATE, FERRITIN, TIBC, IRON, RETICCTPCT in the last 72 hours. Urine analysis:  Component Value Date/Time   COLORURINE YELLOW 09/20/2017 1528   APPEARANCEUR HAZY (A) 09/20/2017 1528   LABSPEC 1.013 09/20/2017 1528   PHURINE 8.0 09/20/2017 1528   GLUCOSEU NEGATIVE 09/20/2017 1528   HGBUR NEGATIVE 09/20/2017 1528   BILIRUBINUR NEGATIVE 09/20/2017 1528   BILIRUBINUR negative 05/19/2016 1638   KETONESUR NEGATIVE 09/20/2017 1528   PROTEINUR 30 (A) 09/20/2017 1528   UROBILINOGEN 0.2 05/19/2016 1638   UROBILINOGEN 0.2 08/17/2015 1853   NITRITE NEGATIVE 09/20/2017 [place] 09/20/2017 1528     Ripudeep Rai M.D. [facility] 09/23/2017, 11:31 AM  Pager: [phone] Between 7am to 7pm - call Pager - [phone]  After 7pm go to [email or web address] - password [email or web address]  Call night coverage person  covering after 7pm
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
## 2017-09-23 NOTE — Plan of Care (Signed)
  Progressing Elimination: Will not experience complications related to urinary retention 09/23/2017 0234 - Progressing by Colonel Bald, RN Note Voiding without difficulty.  Jodell Cipro  Pain Managment: General experience of comfort will improve 09/23/2017 0234 - Progressing by Colonel Bald, RN Note Pain controlled with po pain meds.  Jodell Cipro

## 2017-09-24 ENCOUNTER — Ambulatory Visit: Payer: Self-pay | Admitting: Internal Medicine

## 2017-09-24 ENCOUNTER — Other Ambulatory Visit: Payer: Self-pay | Admitting: *Deleted

## 2017-09-24 DIAGNOSIS — A0472 Enterocolitis due to Clostridium difficile, not specified as recurrent: Secondary | ICD-10-CM

## 2017-09-24 DIAGNOSIS — D61818 Other pancytopenia: Secondary | ICD-10-CM

## 2017-09-24 LAB — CBC
HCT: 22.7 % — ABNORMAL LOW (ref 36.0–46.0)
HEMOGLOBIN: 7.5 g/dL — AB (ref 12.0–15.0)
MCH: 34.4 pg — ABNORMAL HIGH (ref 26.0–34.0)
MCHC: 33 g/dL (ref 30.0–36.0)
MCV: 104.1 fL — ABNORMAL HIGH (ref 78.0–100.0)
Platelets: 83 10*3/uL — ABNORMAL LOW (ref 150–400)
RBC: 2.18 MIL/uL — AB (ref 3.87–5.11)
RDW: 23.2 % — ABNORMAL HIGH (ref 11.5–15.5)
WBC: 3.3 10*3/uL — AB (ref 4.0–10.5)

## 2017-09-24 LAB — BASIC METABOLIC PANEL
Anion gap: 11 (ref 5–15)
BUN: 5 mg/dL — ABNORMAL LOW (ref 6–20)
CHLORIDE: 103 mmol/L (ref 101–111)
CO2: 18 mmol/L — ABNORMAL LOW (ref 22–32)
Calcium: 8 mg/dL — ABNORMAL LOW (ref 8.9–10.3)
Creatinine, Ser: 0.82 mg/dL (ref 0.44–1.00)
Glucose, Bld: 89 mg/dL (ref 65–99)
POTASSIUM: 3.1 mmol/L — AB (ref 3.5–5.1)
SODIUM: 132 mmol/L — AB (ref 135–145)

## 2017-09-24 MED ORDER — PREGABALIN 50 MG PO CAPS: 50.0000 mg | ORAL_CAPSULE | Freq: Three times a day (TID) | ORAL | 1 refills | Status: DC

## 2017-09-24 MED ORDER — CHOLESTYRAMINE LIGHT 4 G PO PACK
4.0000 g | PACK | Freq: Once | ORAL | Status: AC
  Administered 2017-09-24: 4 g via ORAL
  Filled 2017-09-24: qty 1

## 2017-09-24 NOTE — Progress Notes (Signed)
INFECTIOUS DISEASE PROGRESS NOTE  ID: Jamie Allen is a 46 y.o. female with  Principal Problem:   Sepsis associated hypotension ([place]) Active Problems:   Colitis presumed infectious   Alcoholic cirrhosis of liver without ascites ([place])   Anemia of chronic disease  Subjective: Feels better 10 BM so far today  Abtx:  Anti-infectives (From admission, onward)   Start     Dose/Rate Route Frequency Ordered Stop   09/23/17 2000  metroNIDAZOLE (FLAGYL) IVPB 500 mg     500 mg 100 mL/hr over 60 Minutes Intravenous Every 8 hours 09/23/17 1556     09/21/17 2200  cefTRIAXone (ROCEPHIN) 2 g in dextrose 5 % 50 mL IVPB  Status:  Discontinued     2 g 100 mL/hr over 30 Minutes Intravenous Every 24 hours 09/20/17 2314 09/20/17 2328   09/21/17 1300  vancomycin (VANCOCIN) 50 mg/mL oral solution 500 mg     500 mg Oral Every 6 hours 09/21/17 1110     09/21/17 1200  vancomycin (VANCOCIN) 50 mg/mL oral solution 500 mg  Status:  Discontinued     500 mg Oral Every 6 hours 09/21/17 0857 09/21/17 0925   09/21/17 1200  vancomycin (VANCOCIN) 500 mg in sodium chloride irrigation 0.9 % 100 mL ENEMA  Status:  Discontinued     500 mg Rectal Every 6 hours 09/21/17 0925 09/21/17 1110   09/21/17 0600  cefTRIAXone (ROCEPHIN) 2 g in dextrose 5 % 50 mL IVPB  Status:  Discontinued     2 g 100 mL/hr over 30 Minutes Intravenous Every 24 hours 09/20/17 2328 09/21/17 0857   09/21/17 0000  metroNIDAZOLE (FLAGYL) IVPB 500 mg  Status:  Discontinued     500 mg 100 mL/hr over 60 Minutes Intravenous Every 8 hours 09/20/17 2305 09/23/17 1556   09/20/17 2315  cefTRIAXone (ROCEPHIN) 2 g in dextrose 5 % 50 mL IVPB  Status:  Discontinued     2 g 100 mL/hr over 30 Minutes Intravenous  Once 09/20/17 2314 09/21/17 0857   09/20/17 2200  piperacillin-tazobactam (ZOSYN) IVPB 3.375 g  Status:  Discontinued     3.375 g 12.5 mL/hr over 240 Minutes Intravenous Every 8 hours 09/20/17 1537 09/20/17 2305   09/20/17 1530   piperacillin-tazobactam (ZOSYN) IVPB 3.375 g     3.375 g 100 mL/hr over 30 Minutes Intravenous  Once 09/20/17 1521 09/20/17 1646      Medications:  Scheduled: . cholestyramine light  4 g Oral Once  . feeding supplement  1 Container Oral TID BM  . folic acid  1 mg Oral Daily  . gabapentin  300 mg Oral QHS  . levothyroxine  75 mcg Oral QAC breakfast  . potassium chloride  40 mEq Oral Daily  . thiamine  100 mg Oral Daily  . vancomycin  500 mg Oral Q6H    Objective: Vital signs in last 24 hours: Temp:  [98.2 F (36.8 C)-101 F (38.3 C)] 98.5 F (36.9 C) (12/06 1327) Pulse Rate:  [86-105] 105 (12/06 1327) Resp:  [14-26] 14 (12/06 1327) BP: (105-115)/(49-66) 112/66 (12/06 1327) SpO2:  [90 %-98 %] 98 % (12/06 0655) Weight:  [64.6 kg (142 lb 8 oz)] 64.6 kg (142 lb 8 oz) (12/06 0655)   General appearance: alert, cooperative and no distress Resp: clear to auscultation bilaterally Cardio: regular rate and rhythm GI: normal findings: bowel sounds normal and soft, non-tender and abnormal findings:  distended Extremities: edema none  Lab Results Recent Labs    09/23/17 0357  09/24/17 0514  WBC 3.3* 3.3*  HGB 7.5* 7.5*  HCT 22.3* 22.7*  NA 133* 132*  K 2.8* 3.1*  CL 103 103  CO2 18* 18*  BUN 7 5*  CREATININE 1.03* 0.82   Liver Panel No results for input(s): PROT, ALBUMIN, AST, ALT, ALKPHOS, BILITOT, BILIDIR, IBILI in the last 72 hours. Sedimentation Rate No results for input(s): ESRSEDRATE in the last 72 hours. C-Reactive Protein No results for input(s): CRP in the last 72 hours.  Microbiology: Recent Results (from the past 240 hour(s))  Blood Culture (routine x 2)     Status: None (Preliminary result)   Collection Time: 09/20/17  3:15 PM  Result Value Ref Range Status   Specimen Description BLOOD LEFT ANTECUBITAL  Final   Special Requests   Final    BOTTLES DRAWN AEROBIC AND ANAEROBIC Blood Culture adequate volume   Culture NO GROWTH 4 DAYS  Final   Report Status  PENDING  Incomplete  Blood Culture (routine x 2)     Status: None (Preliminary result)   Collection Time: 09/20/17  3:25 PM  Result Value Ref Range Status   Specimen Description BLOOD LEFT HAND  Final   Special Requests   Final    BOTTLES DRAWN AEROBIC AND ANAEROBIC Blood Culture adequate volume   Culture NO GROWTH 4 DAYS  Final   Report Status PENDING  Incomplete  C difficile quick scan w PCR reflex     Status: Abnormal   Collection Time: 09/20/17  3:25 PM  Result Value Ref Range Status   C Diff antigen POSITIVE (A) NEGATIVE Final   C Diff toxin NEGATIVE NEGATIVE Final   C Diff interpretation Results are indeterminate. See PCR results.  Final  Clostridium Difficile by PCR     Status: Abnormal   Collection Time: 09/20/17  3:25 PM  Result Value Ref Range Status   Toxigenic C Difficile by pcr POSITIVE (A) NEGATIVE Final    Comment: Positive for toxigenic C. difficile with little to no toxin production. Only treat if clinical presentation suggests symptomatic illness.  MRSA PCR Screening     Status: None   Collection Time: 09/22/17 11:01 AM  Result Value Ref Range Status   MRSA by PCR NEGATIVE NEGATIVE Final    Comment:        The GeneXpert MRSA Assay (FDA approved for NASAL specimens only), is one component of a comprehensive MRSA colonization surveillance program. It is not intended to diagnose MRSA infection nor to guide or monitor treatment for MRSA infections.     Studies/Results: No results found.   Assessment/Plan: Recurrent C diff Pancytopenia Hx of ETOH abuse   Improving BM No change in IV flagyl-po vanco If she continues to improve will stop IV flagyl next 24h Cytopenias stable.  +6.5L  Total days of antibiotics: 4 vanco/flagyl         Bobby Rumpf MD, FACP Infectious Diseases (pager) [phone] [email or web address] 09/24/2017, 2:48 PM  LOS: 4 days

## 2017-09-24 NOTE — Progress Notes (Signed)
[facility]                                                                              Patient Demographics  Jamie Allen, is a 46 y.o. female, DOB - [birth year], QIH:[id number]  Admit date - 09/20/2017   Admitting Physician Etta Quill, DO  Outpatient Primary MD for the patient is Ladell Pier, MD  Outpatient specialists:   LOS - 4  days   Medical records reviewed and are as summarized below:    Chief Complaint  Patient presents with  . Emesis       Brief summary   Jamie Allen is a 46 y.o. female with a history of c diff, anemia, cirrhosis, alcohol abuse. She presented with abdominal pain with fever and is now found to have severe C. Difficile colitis    Assessment & Plan    Principal Problem:   Sepsis associated hypotension ([place]) -Patient met sepsis criteria at the time of admission with hypotension, tachycardia, acute C. difficile fulminant colitis.  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 -ID consulted, patient reports that she had 10 episodes of BMs last night (which she states is slightly improving from admission), will try 1 pack of cholestyramine.     Alcoholic cirrhosis of liver without ascites (HCC) -Currently stable, no acute issues    Anemia of chronic disease, pancytopenia -Likely due to liver disease, status post 1 unit packed RBCs -H&H stable at 7.5, platelets improving  Right ankle pain -History of fracture status post ORIF in 2016, -Right ankle x-ray on 12/4 showed no acute osseous abnormality, prior medial and lateral malleolus ORIF without evidence of hardware complication   Hypokalemia, hypomagnesemia -Potassium 3.1 today however placed on daily replacement  Code Status: full  DVT Prophylaxis:  SCD's Family Communication: Discussed  in detail with the patient, all imaging results, lab results explained to the patient   Disposition Plan:   Time Spent in minutes   25 minutes  Procedures:    Consultants:   Infectious disease, Dr. Johnnye Sima  Antimicrobials:      Medications  Scheduled Meds: . cholestyramine light  4 g Oral Once  . feeding supplement  1 Container Oral TID BM  . folic acid  1 mg Oral Daily  . gabapentin  300 mg Oral QHS  . levothyroxine  75 mcg Oral QAC breakfast  . potassium chloride  40 mEq Oral Daily  . thiamine  100 mg Oral Daily  . vancomycin  500 mg Oral Q6H   Continuous Infusions: . metronidazole Stopped (09/24/17 0830)   PRN Meds:.acetaminophen, alum & mag hydroxide-simeth, diphenhydrAMINE, HYDROmorphone (DILAUDID) injection, nicotine polacrilex, ondansetron **OR** ondansetron (ZOFRAN) IV, oxyCODONE, promethazine, promethazine   Antibiotics   Anti-infectives (From admission, onward)   Start     Dose/Rate Route Frequency Ordered Stop   09/23/17 2000  metroNIDAZOLE (FLAGYL) IVPB 500 mg     500 mg 100 mL/hr over 60 Minutes Intravenous Every 8 hours 09/23/17 1556  09/21/17 2200  cefTRIAXone (ROCEPHIN) 2 g in dextrose 5 % 50 mL IVPB  Status:  Discontinued     2 g 100 mL/hr over 30 Minutes Intravenous Every 24 hours 09/20/17 2314 09/20/17 2328   09/21/17 1300  vancomycin (VANCOCIN) 50 mg/mL oral solution 500 mg     500 mg Oral Every 6 hours 09/21/17 1110     09/21/17 1200  vancomycin (VANCOCIN) 50 mg/mL oral solution 500 mg  Status:  Discontinued     500 mg Oral Every 6 hours 09/21/17 0857 09/21/17 0925   09/21/17 1200  vancomycin (VANCOCIN) 500 mg in sodium chloride irrigation 0.9 % 100 mL ENEMA  Status:  Discontinued     500 mg Rectal Every 6 hours 09/21/17 0925 09/21/17 1110   09/21/17 0600  cefTRIAXone (ROCEPHIN) 2 g in dextrose 5 % 50 mL IVPB  Status:  Discontinued     2 g 100 mL/hr over 30 Minutes Intravenous Every 24 hours 09/20/17 2328 09/21/17 0857   09/21/17 0000   metroNIDAZOLE (FLAGYL) IVPB 500 mg  Status:  Discontinued     500 mg 100 mL/hr over 60 Minutes Intravenous Every 8 hours 09/20/17 2305 09/23/17 1556   09/20/17 2315  cefTRIAXone (ROCEPHIN) 2 g in dextrose 5 % 50 mL IVPB  Status:  Discontinued     2 g 100 mL/hr over 30 Minutes Intravenous  Once 09/20/17 2314 09/21/17 0857   09/20/17 2200  piperacillin-tazobactam (ZOSYN) IVPB 3.375 g  Status:  Discontinued     3.375 g 12.5 mL/hr over 240 Minutes Intravenous Every 8 hours 09/20/17 1537 09/20/17 2305   09/20/17 1530  piperacillin-tazobactam (ZOSYN) IVPB 3.375 g     3.375 g 100 mL/hr over 30 Minutes Intravenous  Once 09/20/17 1521 09/20/17 1646        Subjective:   Jamie Allen was seen and examined today.  Patient reports 10 episodes of BMs overnight.  Low-grade fever of 98.2 F.  Patient denies dizziness, chest pain, shortness of breath, abdominal pain, N/V, new weakness, numbess, tingling. No acute events overnight.    Objective:   Vitals:   09/24/17 0600 09/24/17 0655 09/24/17 0715 09/24/17 0751  BP:  (!) 113/55  115/65  Pulse:  90  (!) 103  Resp: 17 20 17 18   Temp:  98.8 F (37.1 C) 99.8 F (37.7 C) 99.2 F (37.3 C)  TempSrc:  Oral Oral Oral  SpO2:  98%    Weight:  64.6 kg (142 lb 8 oz)    Height:        Intake/Output Summary (Last 24 hours) at 09/24/2017 1154 Last data filed at 09/24/2017 0900 Gross per 24 hour  Intake 1000 ml  Output 325 ml  Net 675 ml     Wt Readings from Last 3 Encounters:  09/24/17 64.6 kg (142 lb 8 oz)  09/02/17 61 kg (134 lb 6.4 oz)  08/21/17 60.8 kg (134 lb 2 oz)     Exam   General: Alert and oriented x 3, NAD  Eyes:   HEENT:  Atraumatic, normocephalic  Cardiovascular: S1 S2 clear, RRR No pedal edema b/l  Respiratory: Clear to auscultation bilaterally, no wheezing, rales or rhonchi  Gastrointestinal: Soft, nontender, nondistended, + bowel sounds  Ext: no pedal edema bilaterally  Neuro: no new deficits  Musculoskeletal: No  digital cyanosis, clubbing  Skin: No rashes  Psych: Normal affect and demeanor, alert and oriented x3    Data Reviewed:  I have personally reviewed following labs and imaging studies  Micro Results Recent Results (from the past 240 hour(s))  Blood Culture (routine x 2)     Status: None (Preliminary result)   Collection Time: 09/20/17  3:15 PM  Result Value Ref Range Status   Specimen Description BLOOD LEFT ANTECUBITAL  Final   Special Requests   Final    BOTTLES DRAWN AEROBIC AND ANAEROBIC Blood Culture adequate volume   Culture NO GROWTH 4 DAYS  Final   Report Status PENDING  Incomplete  Blood Culture (routine x 2)     Status: None (Preliminary result)   Collection Time: 09/20/17  3:25 PM  Result Value Ref Range Status   Specimen Description BLOOD LEFT HAND  Final   Special Requests   Final    BOTTLES DRAWN AEROBIC AND ANAEROBIC Blood Culture adequate volume   Culture NO GROWTH 4 DAYS  Final   Report Status PENDING  Incomplete  C difficile quick scan w PCR reflex     Status: Abnormal   Collection Time: 09/20/17  3:25 PM  Result Value Ref Range Status   C Diff antigen POSITIVE (A) NEGATIVE Final   C Diff toxin NEGATIVE NEGATIVE Final   C Diff interpretation Results are indeterminate. See PCR results.  Final  Clostridium Difficile by PCR     Status: Abnormal   Collection Time: 09/20/17  3:25 PM  Result Value Ref Range Status   Toxigenic C Difficile by pcr POSITIVE (A) NEGATIVE Final    Comment: Positive for toxigenic C. difficile with little to no toxin production. Only treat if clinical presentation suggests symptomatic illness.  MRSA PCR Screening     Status: None   Collection Time: 09/22/17 11:01 AM  Result Value Ref Range Status   MRSA by PCR NEGATIVE NEGATIVE Final    Comment:        The GeneXpert MRSA Assay (FDA approved for NASAL specimens only), is one component of a comprehensive MRSA colonization surveillance program. It is not intended to diagnose  MRSA infection nor to guide or monitor treatment for MRSA infections.     Radiology Reports Ct Abdomen Pelvis Wo Contrast  Result Date: 09/20/2017 CLINICAL DATA:  Right lower quadrant pain with nausea, vomiting, and diarrhea for 4 days. EXAM: CT ABDOMEN AND PELVIS WITHOUT CONTRAST TECHNIQUE: Multidetector CT imaging of the abdomen and pelvis was performed following the standard protocol without IV contrast. COMPARISON:  CT scan June 28, 2017 FINDINGS: Lower chest: No acute abnormality. Hepatobiliary: The liver is enlarged with a mildly nodular contour. No suspicious mass. Previous cholecystectomy. Pancreas: Unremarkable. No pancreatic ductal dilatation or surrounding inflammatory changes. Spleen: Stable splenomegaly. Adrenals/Urinary Tract: Adrenal glands are normal. The atrophic left kidney is stable. No renal stones, masses, or hydronephrosis. No perinephric stranding. The right ureter is normal with no stones. The bladder is unremarkable. Stomach/Bowel: The stomach is poorly evaluated due to lack of distention. The wall is mildly prominent, likely due to lack of distention. The small bowel is normal. Contrast is seen in the colon to the level of the rectum. The colon is normal. The appendix is normal, best seen on coronal images. There is some stranding and probably a small amount of fluid in the right pericolic gutter which abuts the appendix. However, this does not appear to arise from the appendix. Vascular/Lymphatic: Minimal atherosclerotic change in the abdominal aorta. No aneurysm. No adenopathy. Reproductive: Uterus and bilateral adnexa are unremarkable. Other: There is some stranding and possibly a small amount of fluid in the right pericolic gutter extending towards midline  such as on axial image 57 which is new since the previous study. An underlying cause is not identified. Adjacent structures are normal in appearance. Musculoskeletal: No acute or significant osseous findings. IMPRESSION:  1. There is some increased attenuation in the fat of the right pericolic gutter and probably a tiny amount of fluid extending towards midline which is new since the previous study but is nonspecific. An underlying etiology is not seen. The appendix is adjacent to this finding but normal in appearance and there is no evidence of appendicitis on this study. 2. The liver is enlarged and mildly nodular.  Splenomegaly persists. 3. Mild atherosclerotic change. Aortic Atherosclerosis (ICD10-I70.0). Electronically Signed   By: Dorise Bullion III M.D   On: 09/20/2017 20:33   Dg Ankle Complete Right  Result Date: 09/22/2017 CLINICAL DATA:  Right ankle pain.  No injury. EXAM: RIGHT ANKLE - COMPLETE 3+ VIEW COMPARISON:  Right ankle x-rays dated October 07, 2015. FINDINGS: No acute fracture or malalignment. Postsurgical changes related to prior medial and lateral malleolar ORIF without evidence of hardware failure or loosening. The talar dome is intact. The ankle mortise is symmetric. The bones are osteopenic. Probable small tibiotalar joint effusion. Soft tissues are unremarkable. IMPRESSION: 1.  No acute osseous abnormality. 2. Prior medial and lateral malleolar ORIF without evidence of hardware complication. Electronically Signed   By: Titus Dubin M.D.   On: 09/22/2017 12:46   Dg Chest Port 1 View  Result Date: 09/20/2017 CLINICAL DATA:  Fever. EXAM: PORTABLE CHEST 1 VIEW COMPARISON:  06/30/2017 FINDINGS: The heart size and mediastinal contours are within normal limits. Both lungs are clear. The visualized skeletal structures are unremarkable. IMPRESSION: No active disease. Electronically Signed   By: Earle Gell M.D.   On: 09/20/2017 16:06    Lab Data:  CBC: Recent Labs  Lab 09/21/17 0404 09/21/17 0941 09/22/17 0700 09/23/17 0357 09/24/17 0514  WBC 2.5* 2.6* 3.8* 3.3* 3.3*  NEUTROABS 1.9 1.9  --   --   --   HGB 7.7* 7.9* 8.5* 7.5* 7.5*  HCT 23.7* 24.2* 25.8* 22.3* 22.7*  MCV 107.7* 108.0*  106.6* 106.2* 104.1*  PLT 81* 81* 74* 78* 83*   Basic Metabolic Panel: Recent Labs  Lab 09/20/17 1405 09/21/17 0404 09/22/17 0700 09/23/17 0357 09/23/17 0806 09/24/17 0514  NA 137 135 133* 133*  --  132*  K 3.4* 3.0* 3.9 2.8*  --  3.1*  CL 103 106 101 103  --  103  CO2 23 20* 19* 18*  --  18*  GLUCOSE 107* 91 89 123*  --  89  BUN 8 8 13 7   --  5*  CREATININE 1.17* 1.14* 1.25* 1.03*  --  0.82  CALCIUM 8.7* 7.5* 7.7* 7.7*  --  8.0*  MG  --   --   --   --  1.5*  --    GFR: Estimated Creatinine Clearance: 73.8 mL/min (by C-G formula based on SCr of 0.82 mg/dL). Liver Function Tests: Recent Labs  Lab 09/20/17 1405  AST 57*  ALT 15  ALKPHOS 167*  BILITOT 3.9*  PROT 7.2  ALBUMIN 3.0*   Recent Labs  Lab 09/20/17 1405  LIPASE 39   No results for input(s): AMMONIA in the last 168 hours. Coagulation Profile: Recent Labs  Lab 09/20/17 1521  INR 1.73   Cardiac Enzymes: No results for input(s): CKTOTAL, CKMB, CKMBINDEX, TROPONINI in the last 168 hours. BNP (last 3 results) No results for input(s): PROBNP in the last  8760 hours. HbA1C: No results for input(s): HGBA1C in the last 72 hours. CBG: No results for input(s): GLUCAP in the last 168 hours. Lipid Profile: No results for input(s): CHOL, HDL, LDLCALC, TRIG, CHOLHDL, LDLDIRECT in the last 72 hours. Thyroid Function Tests: No results for input(s): TSH, T4TOTAL, FREET4, T3FREE, THYROIDAB in the last 72 hours. Anemia Panel: No results for input(s): VITAMINB12, FOLATE, FERRITIN, TIBC, IRON, RETICCTPCT in the last 72 hours. Urine analysis:    Component Value Date/Time   COLORURINE YELLOW 09/20/2017 1528   APPEARANCEUR HAZY (A) 09/20/2017 1528   LABSPEC 1.013 09/20/2017 1528   PHURINE 8.0 09/20/2017 1528   GLUCOSEU NEGATIVE 09/20/2017 1528   HGBUR NEGATIVE 09/20/2017 [place] 09/20/2017 1528   BILIRUBINUR negative 05/19/2016 1638   KETONESUR NEGATIVE 09/20/2017 1528   PROTEINUR 30 (A)  09/20/2017 1528   UROBILINOGEN 0.2 05/19/2016 1638   UROBILINOGEN 0.2 08/17/2015 1853   NITRITE NEGATIVE 09/20/2017 1528   LEUKOCYTESUR NEGATIVE 09/20/2017 1528     Ripudeep Rai M.D. [facility] 09/24/2017, 11:54 AM  Pager: [phone] Between 7am to 7pm - call Pager - [phone]  After 7pm go to [email or web address] - password [email or web address]  Call night coverage person covering after 7pm

## 2017-09-24 NOTE — Telephone Encounter (Signed)
PRINTED FOR PASS PROGRAM 

## 2017-09-25 ENCOUNTER — Inpatient Hospital Stay (HOSPITAL_COMMUNITY): Payer: Medicaid Other

## 2017-09-25 LAB — CULTURE, BLOOD (ROUTINE X 2)
CULTURE: NO GROWTH
Culture: NO GROWTH
SPECIAL REQUESTS: ADEQUATE
SPECIAL REQUESTS: ADEQUATE

## 2017-09-25 LAB — BASIC METABOLIC PANEL
Anion gap: 7 (ref 5–15)
BUN: <5 mg/dL — ABNORMAL LOW (ref 6–20)
CHLORIDE: 104 mmol/L (ref 101–111)
CO2: 20 mmol/L — AB (ref 22–32)
CREATININE: 0.73 mg/dL (ref 0.44–1.00)
Calcium: 8.2 mg/dL — ABNORMAL LOW (ref 8.9–10.3)
GFR calc non Af Amer: >60 mL/min (ref >60)
Glucose, Bld: 83 mg/dL (ref 65–99)
POTASSIUM: 3.6 mmol/L (ref 3.5–5.1)
Sodium: 131 mmol/L — ABNORMAL LOW (ref 135–145)

## 2017-09-25 LAB — CBC
HEMATOCRIT: 22.5 % — AB (ref 36.0–46.0)
HEMOGLOBIN: 7.5 g/dL — AB (ref 12.0–15.0)
MCH: 34.7 pg — ABNORMAL HIGH (ref 26.0–34.0)
MCHC: 33.3 g/dL (ref 30.0–36.0)
MCV: 104.2 fL — ABNORMAL HIGH (ref 78.0–100.0)
Platelets: 107 10*3/uL — ABNORMAL LOW (ref 150–400)
RBC: 2.16 MIL/uL — ABNORMAL LOW (ref 3.87–5.11)
RDW: 22.4 % — ABNORMAL HIGH (ref 11.5–15.5)
WBC: 3.4 10*3/uL — ABNORMAL LOW (ref 4.0–10.5)

## 2017-09-25 LAB — MAGNESIUM: Magnesium: 1.9 mg/dL (ref 1.7–2.4)

## 2017-09-25 MED ORDER — GERHARDT'S BUTT CREAM
TOPICAL_CREAM | CUTANEOUS | Status: DC | PRN
  Administered 2017-09-25: 14:00:00 via TOPICAL
  Filled 2017-09-25: qty 1

## 2017-09-25 MED ORDER — DIPHENHYDRAMINE HCL 25 MG PO CAPS: 50.0000 mg | ORAL_CAPSULE | Freq: Once | ORAL | Status: AC

## 2017-09-25 MED ORDER — DIPHENHYDRAMINE HCL 50 MG/ML IJ SOLN
50.0000 mg | Freq: Once | INTRAMUSCULAR | Status: AC
  Administered 2017-09-25: 50 mg via INTRAVENOUS
  Filled 2017-09-25: qty 1

## 2017-09-25 MED ORDER — PREDNISONE 20 MG PO TABS
50.0000 mg | ORAL_TABLET | Freq: Four times a day (QID) | ORAL | Status: AC
  Administered 2017-09-25 (×3): 50 mg via ORAL
  Filled 2017-09-25 (×3): qty 2

## 2017-09-25 MED ORDER — IOPAMIDOL (ISOVUE-300) INJECTION 61%
INTRAVENOUS | Status: AC
  Administered 2017-09-25: 100 mL
  Filled 2017-09-25: qty 100

## 2017-09-25 NOTE — Progress Notes (Signed)
INFECTIOUS DISEASE PROGRESS NOTE  ID: Jamie Allen is a 46 y.o. female with  Principal Problem:   Sepsis associated hypotension ([place]) Active Problems:   Colitis presumed infectious   Alcoholic cirrhosis of liver without ascites (HCC)   Anemia of chronic disease   C. difficile enteritis   Pancytopenia (HCC)  Subjective: Feeling horrible. Up to the bathroom hourly last night. Complaining of her bottom being sore. Worsened abdominal extension and tenderness. No vomiting. Reports chills and fevers.   Abtx:  Anti-infectives (From admission, onward)   Start     Dose/Rate Route Frequency Ordered Stop   09/23/17 2000  metroNIDAZOLE (FLAGYL) IVPB 500 mg     500 mg 100 mL/hr over 60 Minutes Intravenous Every 8 hours 09/23/17 1556     09/21/17 2200  cefTRIAXone (ROCEPHIN) 2 g in dextrose 5 % 50 mL IVPB  Status:  Discontinued     2 g 100 mL/hr over 30 Minutes Intravenous Every 24 hours 09/20/17 2314 09/20/17 2328   09/21/17 1300  vancomycin (VANCOCIN) 50 mg/mL oral solution 500 mg     500 mg Oral Every 6 hours 09/21/17 1110     09/21/17 1200  vancomycin (VANCOCIN) 50 mg/mL oral solution 500 mg  Status:  Discontinued     500 mg Oral Every 6 hours 09/21/17 0857 09/21/17 0925   09/21/17 1200  vancomycin (VANCOCIN) 500 mg in sodium chloride irrigation 0.9 % 100 mL ENEMA  Status:  Discontinued     500 mg Rectal Every 6 hours 09/21/17 0925 09/21/17 1110   09/21/17 0600  cefTRIAXone (ROCEPHIN) 2 g in dextrose 5 % 50 mL IVPB  Status:  Discontinued     2 g 100 mL/hr over 30 Minutes Intravenous Every 24 hours 09/20/17 2328 09/21/17 0857   09/21/17 0000  metroNIDAZOLE (FLAGYL) IVPB 500 mg  Status:  Discontinued     500 mg 100 mL/hr over 60 Minutes Intravenous Every 8 hours 09/20/17 2305 09/23/17 1556   09/20/17 2315  cefTRIAXone (ROCEPHIN) 2 g in dextrose 5 % 50 mL IVPB  Status:  Discontinued     2 g 100 mL/hr over 30 Minutes Intravenous  Once 09/20/17 2314 09/21/17 0857   09/20/17 2200   piperacillin-tazobactam (ZOSYN) IVPB 3.375 g  Status:  Discontinued     3.375 g 12.5 mL/hr over 240 Minutes Intravenous Every 8 hours 09/20/17 1537 09/20/17 2305   09/20/17 1530  piperacillin-tazobactam (ZOSYN) IVPB 3.375 g     3.375 g 100 mL/hr over 30 Minutes Intravenous  Once 09/20/17 1521 09/20/17 1646      Medications:  Scheduled: . diphenhydrAMINE  50 mg Oral Once   Or  . diphenhydrAMINE  50 mg Intravenous Once  . feeding supplement  1 Container Oral TID BM  . folic acid  1 mg Oral Daily  . gabapentin  300 mg Oral QHS  . levothyroxine  75 mcg Oral QAC breakfast  . potassium chloride  40 mEq Oral Daily  . predniSONE  50 mg Oral Q6H  . thiamine  100 mg Oral Daily  . vancomycin  500 mg Oral Q6H    Objective: Vital signs in last 24 hours: Temp:  [97.9 F (36.6 C)-99.1 F (37.3 C)] 98.5 F (36.9 C) (12/07 0726) Pulse Rate:  [89-105] 89 (12/07 0500) Resp:  [14-24] 22 (12/07 0726) BP: (110-120)/(56-66) 117/58 (12/07 0726) SpO2:  [94 %-98 %] 97 % (12/07 0500) Weight:  [140 lb 3.2 oz (63.6 kg)] 140 lb 3.2 oz (63.6 kg) (12/07  0500)  General appearance: alert, cooperative, no distress and tearful today  Resp: clear to auscultation bilaterally Cardio: regular rate and rhythm GI: normal findings: bowel sounds normal and soft, non-tender and abnormal findings:  distended and diffuse tenderness  Extremities: edema none  Lab Results Recent Labs    09/24/17 0514 09/25/17 0250  WBC 3.3* 3.4*  HGB 7.5* 7.5*  HCT 22.7* 22.5*  NA 132* 131*  K 3.1* 3.6  CL 103 104  CO2 18* 20*  BUN 5* <5*  CREATININE 0.82 0.73   Liver Panel No results for input(s): PROT, ALBUMIN, AST, ALT, ALKPHOS, BILITOT, BILIDIR, IBILI in the last 72 hours. Sedimentation Rate No results for input(s): ESRSEDRATE in the last 72 hours. C-Reactive Protein No results for input(s): CRP in the last 72 hours.  Microbiology: Recent Results (from the past 240 hour(s))  Blood Culture (routine x 2)      Status: None (Preliminary result)   Collection Time: 09/20/17  3:15 PM  Result Value Ref Range Status   Specimen Description BLOOD LEFT ANTECUBITAL  Final   Special Requests   Final    BOTTLES DRAWN AEROBIC AND ANAEROBIC Blood Culture adequate volume   Culture NO GROWTH 4 DAYS  Final   Report Status PENDING  Incomplete  Blood Culture (routine x 2)     Status: None (Preliminary result)   Collection Time: 09/20/17  3:25 PM  Result Value Ref Range Status   Specimen Description BLOOD LEFT HAND  Final   Special Requests   Final    BOTTLES DRAWN AEROBIC AND ANAEROBIC Blood Culture adequate volume   Culture NO GROWTH 4 DAYS  Final   Report Status PENDING  Incomplete  C difficile quick scan w PCR reflex     Status: Abnormal   Collection Time: 09/20/17  3:25 PM  Result Value Ref Range Status   C Diff antigen POSITIVE (A) NEGATIVE Final   C Diff toxin NEGATIVE NEGATIVE Final   C Diff interpretation Results are indeterminate. See PCR results.  Final  Clostridium Difficile by PCR     Status: Abnormal   Collection Time: 09/20/17  3:25 PM  Result Value Ref Range Status   Toxigenic C Difficile by pcr POSITIVE (A) NEGATIVE Final    Comment: Positive for toxigenic C. difficile with little to no toxin production. Only treat if clinical presentation suggests symptomatic illness.  MRSA PCR Screening     Status: None   Collection Time: 09/22/17 11:01 AM  Result Value Ref Range Status   MRSA by PCR NEGATIVE NEGATIVE Final    Comment:        The GeneXpert MRSA Assay (FDA approved for NASAL specimens only), is one component of a comprehensive MRSA colonization surveillance program. It is not intended to diagnose MRSA infection nor to guide or monitor treatment for MRSA infections.     Studies/Results: No results found.   Assessment/Plan: Recurrent C diff (3rd episode) - Still with abdominal pain/tenderness, chills, weakness. Trying to stay hydrated with PO  - Now requiring rectal tube  placement for stool control.  - CT A/P in progress to r/o megacolon  - Doubt ileus but if present would do PR Vancomycin 500 mg QID. For now continue PO high dose vanc and IV flagyl.  - If no improvement by next week would consider arranging in house FMT.   Pancytopenia - Cytopenias stable   Hx of ETOH abuse - thiamine / folic acid   Total days of antibiotics: 5 vanco/flagyl  Dr.  Snider to see over the weekend.          Janene Madeira, MSN, NP-C [facility] [place] Cell: [phone] Pager: [phone]  09/25/2017  10:50 AM

## 2017-09-25 NOTE — Plan of Care (Signed)
  Progressing Elimination: Will not experience complications related to bowel motility. Pt had 4 small bowel movements throughout the night.  09/25/2017 0518 - Progressing by Mellissa Kohut, RN Pain Managment: General experience of comfort will improve. Pt continues to have pain and required prn pain medication q4h. Will continue to monitor and assess.  09/25/2017 0518 - Progressing by Mellissa Kohut, RN

## 2017-09-25 NOTE — Progress Notes (Signed)
[facility]                                                                              Patient Demographics  Jamie Allen, is a 46 y.o. female, DOB - September 08, 1971, POL:[id number]  Admit date - 09/20/2017   Admitting Physician Etta Quill, DO  Outpatient Primary MD for the patient is Ladell Pier, MD  Outpatient specialists:   LOS - 5  days   Medical records reviewed and are as summarized below:    Chief Complaint  Patient presents with  . Emesis       Brief summary   Jamie Allen is a 46 y.o. female with a history of c diff, anemia, cirrhosis, alcohol abuse. She presented with abdominal pain with fever and is now found to have severe C. Difficile colitis    Assessment & Plan    Principal Problem:   Sepsis associated hypotension ([place]) -Patient met sepsis criteria at the time of admission with hypotension, tachycardia, acute C. difficile fulminant colitis.  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 -ID following -Patient reports multiple episodes overnight, "stayed on the toilet", did not sleep, will place a rectal tube.  She also reported abdominal pain hence ordered CT abdomen pelvis with contrast ( with prophylaxis) to rule out any toxic megacolon    Alcoholic cirrhosis of liver without ascites (HCC) -Currently stable, no acute issues    Anemia of chronic disease, pancytopenia -Likely due to liver disease, status post 1 unit packed RBCs -H&H remained stable, platelets improving  Right ankle pain -History of fracture status post ORIF in 2016, -Right ankle x-ray on 12/4 showed no acute osseous abnormality, prior medial and lateral malleolus ORIF without evidence of hardware complication   Hypokalemia, hypomagnesemia -Continue daily K replacement  Code Status:  full  DVT Prophylaxis:  SCD's Family Communication: Discussed in detail with the patient, all imaging results, lab results explained to the patient   Disposition Plan:   Time Spent in minutes   25 minutes  Procedures:    Consultants:   Infectious disease, Dr. Johnnye Sima  Antimicrobials:      Medications  Scheduled Meds: . diphenhydrAMINE  50 mg Oral Once   Or  . diphenhydrAMINE  50 mg Intravenous Once  . feeding supplement  1 Container Oral TID BM  . folic acid  1 mg Oral Daily  . gabapentin  300 mg Oral QHS  . levothyroxine  75 mcg Oral QAC breakfast  . potassium chloride  40 mEq Oral Daily  . predniSONE  50 mg Oral Q6H  . thiamine  100 mg Oral Daily  . vancomycin  500 mg Oral Q6H   Continuous Infusions: . metronidazole Stopped (09/25/17 0641)   PRN Meds:.acetaminophen, alum & mag hydroxide-simeth, diphenhydrAMINE, Gerhardt's butt cream, HYDROmorphone (DILAUDID) injection, nicotine polacrilex, ondansetron **OR** ondansetron (ZOFRAN) IV, oxyCODONE, promethazine, promethazine   Antibiotics   Anti-infectives (From admission, onward)   Start     Dose/Rate Route Frequency Ordered  Stop   09/23/17 2000  metroNIDAZOLE (FLAGYL) IVPB 500 mg     500 mg 100 mL/hr over 60 Minutes Intravenous Every 8 hours 09/23/17 1556     09/21/17 2200  cefTRIAXone (ROCEPHIN) 2 g in dextrose 5 % 50 mL IVPB  Status:  Discontinued     2 g 100 mL/hr over 30 Minutes Intravenous Every 24 hours 09/20/17 2314 09/20/17 2328   09/21/17 1300  vancomycin (VANCOCIN) 50 mg/mL oral solution 500 mg     500 mg Oral Every 6 hours 09/21/17 1110     09/21/17 1200  vancomycin (VANCOCIN) 50 mg/mL oral solution 500 mg  Status:  Discontinued     500 mg Oral Every 6 hours 09/21/17 0857 09/21/17 0925   09/21/17 1200  vancomycin (VANCOCIN) 500 mg in sodium chloride irrigation 0.9 % 100 mL ENEMA  Status:  Discontinued     500 mg Rectal Every 6 hours 09/21/17 0925 09/21/17 1110   09/21/17 0600  cefTRIAXone (ROCEPHIN) 2  g in dextrose 5 % 50 mL IVPB  Status:  Discontinued     2 g 100 mL/hr over 30 Minutes Intravenous Every 24 hours 09/20/17 2328 09/21/17 0857   09/21/17 0000  metroNIDAZOLE (FLAGYL) IVPB 500 mg  Status:  Discontinued     500 mg 100 mL/hr over 60 Minutes Intravenous Every 8 hours 09/20/17 2305 09/23/17 1556   09/20/17 2315  cefTRIAXone (ROCEPHIN) 2 g in dextrose 5 % 50 mL IVPB  Status:  Discontinued     2 g 100 mL/hr over 30 Minutes Intravenous  Once 09/20/17 2314 09/21/17 0857   09/20/17 2200  piperacillin-tazobactam (ZOSYN) IVPB 3.375 g  Status:  Discontinued     3.375 g 12.5 mL/hr over 240 Minutes Intravenous Every 8 hours 09/20/17 1537 09/20/17 2305   09/20/17 1530  piperacillin-tazobactam (ZOSYN) IVPB 3.375 g     3.375 g 100 mL/hr over 30 Minutes Intravenous  Once 09/20/17 1521 09/20/17 1646        Subjective:   Jamie Allen was seen and examined today.  Miserable with multiple bowel movements overnight.  Also reported diffuse abdominal pain started last night.  No fevers.   Patient denies dizziness, chest pain, shortness of breath, N/V, new weakness, numbess, tingling.  Objective:   Vitals:   09/25/17 0048 09/25/17 0500 09/25/17 0726 09/25/17 1156  BP: (!) 119/56 111/65 (!) 117/58 (!) 102/48  Pulse: 90 89    Resp: 19 (!) 24 (!) 22 17  Temp: 97.9 F (36.6 C) 98.2 F (36.8 C) 98.5 F (36.9 C) 98.2 F (36.8 C)  TempSrc: Oral Oral Oral Oral  SpO2: 98% 97%    Weight:  63.6 kg (140 lb 3.2 oz)    Height:        Intake/Output Summary (Last 24 hours) at 09/25/2017 1213 Last data filed at 09/25/2017 0700 Gross per 24 hour  Intake 240 ml  Output 2 ml  Net 238 ml     Wt Readings from Last 3 Encounters:  09/25/17 63.6 kg (140 lb 3.2 oz)  09/02/17 61 kg (134 lb 6.4 oz)  08/21/17 60.8 kg (134 lb 2 oz)     Exam   General: Alert and oriented x 3, NAD  Eyes:  HEENT:    Cardiovascular: S1 S2 auscultated, Regular rate and rhythm. No pedal edema b/l  Respiratory:  Clear to auscultation bilaterally, no wheezing, rales or rhonchi  Gastrointestinal: Soft, mild diffuse tenderness, distended, + bowel sounds  Ext: no pedal edema bilaterally  Neuro: no new deficit  Musculoskeletal: No digital cyanosis, clubbing  Skin: No rashes  Psych: Normal affect and demeanor, alert and oriented x3   Data Reviewed:  I have personally reviewed following labs and imaging studies  Micro Results Recent Results (from the past 240 hour(s))  Blood Culture (routine x 2)     Status: None (Preliminary result)   Collection Time: 09/20/17  3:15 PM  Result Value Ref Range Status   Specimen Description BLOOD LEFT ANTECUBITAL  Final   Special Requests   Final    BOTTLES DRAWN AEROBIC AND ANAEROBIC Blood Culture adequate volume   Culture NO GROWTH 4 DAYS  Final   Report Status PENDING  Incomplete  Blood Culture (routine x 2)     Status: None (Preliminary result)   Collection Time: 09/20/17  3:25 PM  Result Value Ref Range Status   Specimen Description BLOOD LEFT HAND  Final   Special Requests   Final    BOTTLES DRAWN AEROBIC AND ANAEROBIC Blood Culture adequate volume   Culture NO GROWTH 4 DAYS  Final   Report Status PENDING  Incomplete  C difficile quick scan w PCR reflex     Status: Abnormal   Collection Time: 09/20/17  3:25 PM  Result Value Ref Range Status   C Diff antigen POSITIVE (A) NEGATIVE Final   C Diff toxin NEGATIVE NEGATIVE Final   C Diff interpretation Results are indeterminate. See PCR results.  Final  Clostridium Difficile by PCR     Status: Abnormal   Collection Time: 09/20/17  3:25 PM  Result Value Ref Range Status   Toxigenic C Difficile by pcr POSITIVE (A) NEGATIVE Final    Comment: Positive for toxigenic C. difficile with little to no toxin production. Only treat if clinical presentation suggests symptomatic illness.  MRSA PCR Screening     Status: None   Collection Time: 09/22/17 11:01 AM  Result Value Ref Range Status   MRSA by PCR NEGATIVE  NEGATIVE Final    Comment:        The GeneXpert MRSA Assay (FDA approved for NASAL specimens only), is one component of a comprehensive MRSA colonization surveillance program. It is not intended to diagnose MRSA infection nor to guide or monitor treatment for MRSA infections.     Radiology Reports Ct Abdomen Pelvis Wo Contrast  Result Date: 09/20/2017 CLINICAL DATA:  Right lower quadrant pain with nausea, vomiting, and diarrhea for 4 days. EXAM: CT ABDOMEN AND PELVIS WITHOUT CONTRAST TECHNIQUE: Multidetector CT imaging of the abdomen and pelvis was performed following the standard protocol without IV contrast. COMPARISON:  CT scan June 28, 2017 FINDINGS: Lower chest: No acute abnormality. Hepatobiliary: The liver is enlarged with a mildly nodular contour. No suspicious mass. Previous cholecystectomy. Pancreas: Unremarkable. No pancreatic ductal dilatation or surrounding inflammatory changes. Spleen: Stable splenomegaly. Adrenals/Urinary Tract: Adrenal glands are normal. The atrophic left kidney is stable. No renal stones, masses, or hydronephrosis. No perinephric stranding. The right ureter is normal with no stones. The bladder is unremarkable. Stomach/Bowel: The stomach is poorly evaluated due to lack of distention. The wall is mildly prominent, likely due to lack of distention. The small bowel is normal. Contrast is seen in the colon to the level of the rectum. The colon is normal. The appendix is normal, best seen on coronal images. There is some stranding and probably a small amount of fluid in the right pericolic gutter which abuts the appendix. However, this does not appear to arise from the appendix.  Vascular/Lymphatic: Minimal atherosclerotic change in the abdominal aorta. No aneurysm. No adenopathy. Reproductive: Uterus and bilateral adnexa are unremarkable. Other: There is some stranding and possibly a small amount of fluid in the right pericolic gutter extending towards midline such  as on axial image 57 which is new since the previous study. An underlying cause is not identified. Adjacent structures are normal in appearance. Musculoskeletal: No acute or significant osseous findings. IMPRESSION: 1. There is some increased attenuation in the fat of the right pericolic gutter and probably a tiny amount of fluid extending towards midline which is new since the previous study but is nonspecific. An underlying etiology is not seen. The appendix is adjacent to this finding but normal in appearance and there is no evidence of appendicitis on this study. 2. The liver is enlarged and mildly nodular.  Splenomegaly persists. 3. Mild atherosclerotic change. Aortic Atherosclerosis (ICD10-I70.0). Electronically Signed   By: Dorise Bullion III M.D   On: 09/20/2017 20:33   Dg Ankle Complete Right  Result Date: 09/22/2017 CLINICAL DATA:  Right ankle pain.  No injury. EXAM: RIGHT ANKLE - COMPLETE 3+ VIEW COMPARISON:  Right ankle x-rays dated October 07, 2015. FINDINGS: No acute fracture or malalignment. Postsurgical changes related to prior medial and lateral malleolar ORIF without evidence of hardware failure or loosening. The talar dome is intact. The ankle mortise is symmetric. The bones are osteopenic. Probable small tibiotalar joint effusion. Soft tissues are unremarkable. IMPRESSION: 1.  No acute osseous abnormality. 2. Prior medial and lateral malleolar ORIF without evidence of hardware complication. Electronically Signed   By: Titus Dubin M.D.   On: 09/22/2017 12:46   Dg Chest Port 1 View  Result Date: 09/20/2017 CLINICAL DATA:  Fever. EXAM: PORTABLE CHEST 1 VIEW COMPARISON:  06/30/2017 FINDINGS: The heart size and mediastinal contours are within normal limits. Both lungs are clear. The visualized skeletal structures are unremarkable. IMPRESSION: No active disease. Electronically Signed   By: Earle Gell M.D.   On: 09/20/2017 16:06    Lab Data:  CBC: Recent Labs  Lab 09/21/17 0404  09/21/17 0941 09/22/17 0700 09/23/17 0357 09/24/17 0514 09/25/17 0250  WBC 2.5* 2.6* 3.8* 3.3* 3.3* 3.4*  NEUTROABS 1.9 1.9  --   --   --   --   HGB 7.7* 7.9* 8.5* 7.5* 7.5* 7.5*  HCT 23.7* 24.2* 25.8* 22.3* 22.7* 22.5*  MCV 107.7* 108.0* 106.6* 106.2* 104.1* 104.2*  PLT 81* 81* 74* 78* 83* 229*   Basic Metabolic Panel: Recent Labs  Lab 09/21/17 0404 09/22/17 0700 09/23/17 0357 09/23/17 0806 09/24/17 0514 09/25/17 0250  NA 135 133* 133*  --  132* 131*  K 3.0* 3.9 2.8*  --  3.1* 3.6  CL 106 101 103  --  103 104  CO2 20* 19* 18*  --  18* 20*  GLUCOSE 91 89 123*  --  89 83  BUN 8 13 7   --  5* <5*  CREATININE 1.14* 1.25* 1.03*  --  0.82 0.73  CALCIUM 7.5* 7.7* 7.7*  --  8.0* 8.2*  MG  --   --   --  1.5*  --  1.9   GFR: Estimated Creatinine Clearance: 75 mL/min (by C-G formula based on SCr of 0.73 mg/dL). Liver Function Tests: Recent Labs  Lab 09/20/17 1405  AST 57*  ALT 15  ALKPHOS 167*  BILITOT 3.9*  PROT 7.2  ALBUMIN 3.0*   Recent Labs  Lab 09/20/17 1405  LIPASE 39   No results for  input(s): AMMONIA in the last 168 hours. Coagulation Profile: Recent Labs  Lab 09/20/17 1521  INR 1.73   Cardiac Enzymes: No results for input(s): CKTOTAL, CKMB, CKMBINDEX, TROPONINI in the last 168 hours. BNP (last 3 results) No results for input(s): PROBNP in the last 8760 hours. HbA1C: No results for input(s): HGBA1C in the last 72 hours. CBG: No results for input(s): GLUCAP in the last 168 hours. Lipid Profile: No results for input(s): CHOL, HDL, LDLCALC, TRIG, CHOLHDL, LDLDIRECT in the last 72 hours. Thyroid Function Tests: No results for input(s): TSH, T4TOTAL, FREET4, T3FREE, THYROIDAB in the last 72 hours. Anemia Panel: No results for input(s): VITAMINB12, FOLATE, FERRITIN, TIBC, IRON, RETICCTPCT in the last 72 hours. Urine analysis:    Component Value Date/Time   COLORURINE YELLOW 09/20/2017 1528   APPEARANCEUR HAZY (A) 09/20/2017 1528   LABSPEC 1.013  09/20/2017 1528   PHURINE 8.0 09/20/2017 1528   GLUCOSEU NEGATIVE 09/20/2017 1528   HGBUR NEGATIVE 09/20/2017 [place] 09/20/2017 1528   BILIRUBINUR negative 05/19/2016 1638   KETONESUR NEGATIVE 09/20/2017 1528   PROTEINUR 30 (A) 09/20/2017 1528   UROBILINOGEN 0.2 05/19/2016 1638   UROBILINOGEN 0.2 08/17/2015 1853   NITRITE NEGATIVE 09/20/2017 1528   LEUKOCYTESUR NEGATIVE 09/20/2017 1528     Jarelle Ates M.D. [facility] 09/25/2017, 12:13 PM  Pager: [phone] Between 7am to 7pm - call Pager - [phone]  After 7pm go to [email or web address] - password [email or web address]  Call night coverage person covering after 7pm

## 2017-09-26 DIAGNOSIS — L299 Pruritus, unspecified: Secondary | ICD-10-CM

## 2017-09-26 DIAGNOSIS — K7581 Nonalcoholic steatohepatitis (NASH): Secondary | ICD-10-CM

## 2017-09-26 DIAGNOSIS — D696 Thrombocytopenia, unspecified: Secondary | ICD-10-CM

## 2017-09-26 DIAGNOSIS — D72819 Decreased white blood cell count, unspecified: Secondary | ICD-10-CM

## 2017-09-26 LAB — BASIC METABOLIC PANEL
Anion gap: 10 (ref 5–15)
BUN: <5 mg/dL — ABNORMAL LOW (ref 6–20)
CHLORIDE: 105 mmol/L (ref 101–111)
CO2: 20 mmol/L — AB (ref 22–32)
CREATININE: 0.67 mg/dL (ref 0.44–1.00)
Calcium: 8.8 mg/dL — ABNORMAL LOW (ref 8.9–10.3)
GFR calc non Af Amer: >60 mL/min (ref >60)
Glucose, Bld: 195 mg/dL — ABNORMAL HIGH (ref 65–99)
Potassium: 3.9 mmol/L (ref 3.5–5.1)
Sodium: 135 mmol/L (ref 135–145)

## 2017-09-26 LAB — CBC
HEMATOCRIT: 24.6 % — AB (ref 36.0–46.0)
HEMOGLOBIN: 8.1 g/dL — AB (ref 12.0–15.0)
MCH: 34.5 pg — AB (ref 26.0–34.0)
MCHC: 32.9 g/dL (ref 30.0–36.0)
MCV: 104.7 fL — AB (ref 78.0–100.0)
Platelets: 136 10*3/uL — ABNORMAL LOW (ref 150–400)
RBC: 2.35 MIL/uL — ABNORMAL LOW (ref 3.87–5.11)
RDW: 21.9 % — ABNORMAL HIGH (ref 11.5–15.5)
WBC: 3.3 10*3/uL — ABNORMAL LOW (ref 4.0–10.5)

## 2017-09-26 MED ORDER — HYDROMORPHONE HCL 1 MG/ML IJ SOLN
1.0000 mg | INTRAMUSCULAR | Status: DC | PRN
  Administered 2017-09-26 – 2017-09-28 (×8): 2 mg via INTRAVENOUS
  Filled 2017-09-26 (×8): qty 2

## 2017-09-26 MED ORDER — POTASSIUM CHLORIDE CRYS ER 20 MEQ PO TBCR
20.0000 meq | EXTENDED_RELEASE_TABLET | Freq: Every day | ORAL | Status: DC
  Administered 2017-09-26 – 2017-09-28 (×3): 20 meq via ORAL
  Filled 2017-09-26 (×3): qty 1

## 2017-09-26 MED ORDER — HYDROXYZINE HCL 10 MG PO TABS
10.0000 mg | ORAL_TABLET | Freq: Two times a day (BID) | ORAL | Status: DC
  Administered 2017-09-26 – 2017-09-28 (×4): 10 mg via ORAL
  Filled 2017-09-26 (×4): qty 1

## 2017-09-26 NOTE — Progress Notes (Signed)
[facility]                                                                              Patient Demographics  Jamie Allen, is a 46 y.o. female, DOB - 1971-07-23, MGN:[id number]  Admit date - 09/20/2017   Admitting Physician Etta Quill, DO  Outpatient Primary MD for the patient is Ladell Pier, MD  Outpatient specialists:   LOS - 6  days   Medical records reviewed and are as summarized below:    Chief Complaint  Patient presents with  . Emesis       Brief summary   Jamie Allen is a 46 y.o. female with a history of c diff, anemia, cirrhosis, alcohol abuse. She presented with abdominal pain with fever and is now found to have severe C. Difficile colitis    Assessment & Plan    Principal Problem:   Sepsis associated hypotension ([place]) -Patient met sepsis criteria at the time of admission with hypotension, tachycardia, acute C. difficile fulminant colitis.  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 -CT abdomen and pelvis showed no toxic megacolon, per patient symptoms are slowly improving - will continue current management today, DC IV metronidazole tomorrow if continues to improve    Alcoholic cirrhosis of liver without ascites (HCC) -Currently stable, no acute issues    Anemia of chronic disease, pancytopenia -Likely due to liver disease, status post 1 unit packed RBCs -H&H improving, 8.1 today  Right ankle pain -History of fracture status post ORIF in 2016, -Right ankle x-ray on 12/4 showed no acute osseous abnormality, prior medial and lateral malleolus ORIF without evidence of hardware complication   Hypokalemia, hypomagnesemia -Potassium 3.9 today, will decrease potassium replacement to 20 meq daily  Code Status: full  DVT Prophylaxis:  SCD's Family  Communication: Discussed in detail with the patient, all imaging results, lab results explained to the patient   Disposition Plan:   Time Spent in minutes   25 minutes  Procedures:    Consultants:   Infectious disease, Dr. Johnnye Sima  Antimicrobials:      Medications  Scheduled Meds: . feeding supplement  1 Container Oral TID BM  . folic acid  1 mg Oral Daily  . gabapentin  300 mg Oral QHS  . levothyroxine  75 mcg Oral QAC breakfast  . potassium chloride  20 mEq Oral Daily  . thiamine  100 mg Oral Daily  . vancomycin  500 mg Oral Q6H   Continuous Infusions: . metronidazole Stopped (09/26/17 0812)   PRN Meds:.acetaminophen, alum & mag hydroxide-simeth, diphenhydrAMINE, Gerhardt's butt cream, HYDROmorphone (DILAUDID) injection, nicotine polacrilex, ondansetron **OR** ondansetron (ZOFRAN) IV, oxyCODONE, promethazine, promethazine   Antibiotics   Anti-infectives (From admission, onward)   Start     Dose/Rate Route Frequency Ordered Stop   09/23/17 2000  metroNIDAZOLE (FLAGYL) IVPB 500 mg     500 mg 100 mL/hr over 60 Minutes Intravenous Every 8 hours 09/23/17 1556     09/21/17 2200  cefTRIAXone (ROCEPHIN) 2 g in dextrose 5 % 50 mL IVPB  Status:  Discontinued     2 g 100 mL/hr over 30 Minutes Intravenous Every 24 hours 09/20/17 2314 09/20/17 2328   09/21/17 1300  vancomycin (VANCOCIN) 50 mg/mL oral solution 500 mg     500 mg Oral Every 6 hours 09/21/17 1110     09/21/17 1200  vancomycin (VANCOCIN) 50 mg/mL oral solution 500 mg  Status:  Discontinued     500 mg Oral Every 6 hours 09/21/17 0857 09/21/17 0925   09/21/17 1200  vancomycin (VANCOCIN) 500 mg in sodium chloride irrigation 0.9 % 100 mL ENEMA  Status:  Discontinued     500 mg Rectal Every 6 hours 09/21/17 0925 09/21/17 1110   09/21/17 0600  cefTRIAXone (ROCEPHIN) 2 g in dextrose 5 % 50 mL IVPB  Status:  Discontinued     2 g 100 mL/hr over 30 Minutes Intravenous Every 24 hours 09/20/17 2328 09/21/17 0857   09/21/17  0000  metroNIDAZOLE (FLAGYL) IVPB 500 mg  Status:  Discontinued     500 mg 100 mL/hr over 60 Minutes Intravenous Every 8 hours 09/20/17 2305 09/23/17 1556   09/20/17 2315  cefTRIAXone (ROCEPHIN) 2 g in dextrose 5 % 50 mL IVPB  Status:  Discontinued     2 g 100 mL/hr over 30 Minutes Intravenous  Once 09/20/17 2314 09/21/17 0857   09/20/17 2200  piperacillin-tazobactam (ZOSYN) IVPB 3.375 g  Status:  Discontinued     3.375 g 12.5 mL/hr over 240 Minutes Intravenous Every 8 hours 09/20/17 1537 09/20/17 2305   09/20/17 1530  piperacillin-tazobactam (ZOSYN) IVPB 3.375 g     3.375 g 100 mL/hr over 30 Minutes Intravenous  Once 09/20/17 1521 09/20/17 1646        Subjective:   Jamie Allen was seen and examined today.  Feels BMs are improving, 6 overnight.  Abdominal pain is improving.  No fevers.  Patient denies dizziness, chest pain, shortness of breath, N/V, new weakness, numbess, tingling.  Objective:   Vitals:   09/25/17 1700 09/25/17 1948 09/26/17 0404 09/26/17 1230  BP: 114/61 127/65 (!) 116/52   Pulse:    86  Resp:  _0 Temp: 97.6 F (36.4 C) 97.7 F (36.5 C) 97.9 F (36.6 C) 97.6 F (36.4 C)  TempSrc: Oral Oral  Oral  SpO2:  98% 98%   Weight:   63.1 kg (139 lb 3.2 oz)   Height:        Intake/Output Summary (Last 24 hours) at 09/26/2017 1336 Last data filed at 09/26/2017 1505 Gross per 24 hour  Intake 980 ml  Output 1300 ml  Net -320 ml     Wt Readings from Last 3 Encounters:  09/26/17 63.1 kg (139 lb 3.2 oz)  09/02/17 61 kg (134 lb 6.4 oz)  08/21/17 60.8 kg (134 lb 2 oz)     Exam    General: Alert and oriented x 3, NAD  Eyes:   HEENT:    Cardiovascular: S1 S2 auscultated, no rubs, murmurs or gallops. Regular rate and rhythm. No pedal edema b/l  Respiratory: Clear to auscultation bilaterally, no wheezing, rales or rhonchi  Gastrointestinal: Soft, mildly tender and distended, + bowel sounds  Ext: no pedal edema bilaterally  Neuro: no new  deficit  Musculoskeletal: No digital cyanosis, clubbing  Skin: No rashes  Psych: Normal affect and demeanor, alert and oriented x3   Data Reviewed:  I have personally reviewed following labs and imaging  studies  Micro Results Recent Results (from the past 240 hour(s))  Blood Culture (routine x 2)     Status: None   Collection Time: 09/20/17  3:15 PM  Result Value Ref Range Status   Specimen Description BLOOD LEFT ANTECUBITAL  Final   Special Requests   Final    BOTTLES DRAWN AEROBIC AND ANAEROBIC Blood Culture adequate volume   Culture NO GROWTH 5 DAYS  Final   Report Status 09/25/2017 FINAL  Final  Blood Culture (routine x 2)     Status: None   Collection Time: 09/20/17  3:25 PM  Result Value Ref Range Status   Specimen Description BLOOD LEFT HAND  Final   Special Requests   Final    BOTTLES DRAWN AEROBIC AND ANAEROBIC Blood Culture adequate volume   Culture NO GROWTH 5 DAYS  Final   Report Status 09/25/2017 FINAL  Final  C difficile quick scan w PCR reflex     Status: Abnormal   Collection Time: 09/20/17  3:25 PM  Result Value Ref Range Status   C Diff antigen POSITIVE (A) NEGATIVE Final   C Diff toxin NEGATIVE NEGATIVE Final   C Diff interpretation Results are indeterminate. See PCR results.  Final  Clostridium Difficile by PCR     Status: Abnormal   Collection Time: 09/20/17  3:25 PM  Result Value Ref Range Status   Toxigenic C Difficile by pcr POSITIVE (A) NEGATIVE Final    Comment: Positive for toxigenic C. difficile with little to no toxin production. Only treat if clinical presentation suggests symptomatic illness.  MRSA PCR Screening     Status: None   Collection Time: 09/22/17 11:01 AM  Result Value Ref Range Status   MRSA by PCR NEGATIVE NEGATIVE Final    Comment:        The GeneXpert MRSA Assay (FDA approved for NASAL specimens only), is one component of a comprehensive MRSA colonization surveillance program. It is not intended to diagnose MRSA infection  nor to guide or monitor treatment for MRSA infections.     Radiology Reports Ct Abdomen Pelvis Wo Contrast  Result Date: 09/20/2017 CLINICAL DATA:  Right lower quadrant pain with nausea, vomiting, and diarrhea for 4 days. EXAM: CT ABDOMEN AND PELVIS WITHOUT CONTRAST TECHNIQUE: Multidetector CT imaging of the abdomen and pelvis was performed following the standard protocol without IV contrast. COMPARISON:  CT scan June 28, 2017 FINDINGS: Lower chest: No acute abnormality. Hepatobiliary: The liver is enlarged with a mildly nodular contour. No suspicious mass. Previous cholecystectomy. Pancreas: Unremarkable. No pancreatic ductal dilatation or surrounding inflammatory changes. Spleen: Stable splenomegaly. Adrenals/Urinary Tract: Adrenal glands are normal. The atrophic left kidney is stable. No renal stones, masses, or hydronephrosis. No perinephric stranding. The right ureter is normal with no stones. The bladder is unremarkable. Stomach/Bowel: The stomach is poorly evaluated due to lack of distention. The wall is mildly prominent, likely due to lack of distention. The small bowel is normal. Contrast is seen in the colon to the level of the rectum. The colon is normal. The appendix is normal, best seen on coronal images. There is some stranding and probably a small amount of fluid in the right pericolic gutter which abuts the appendix. However, this does not appear to arise from the appendix. Vascular/Lymphatic: Minimal atherosclerotic change in the abdominal aorta. No aneurysm. No adenopathy. Reproductive: Uterus and bilateral adnexa are unremarkable. Other: There is some stranding and possibly a small amount of fluid in the right pericolic gutter extending towards midline  such as on axial image 57 which is new since the previous study. An underlying cause is not identified. Adjacent structures are normal in appearance. Musculoskeletal: No acute or significant osseous findings. IMPRESSION: 1. There is  some increased attenuation in the fat of the right pericolic gutter and probably a tiny amount of fluid extending towards midline which is new since the previous study but is nonspecific. An underlying etiology is not seen. The appendix is adjacent to this finding but normal in appearance and there is no evidence of appendicitis on this study. 2. The liver is enlarged and mildly nodular.  Splenomegaly persists. 3. Mild atherosclerotic change. Aortic Atherosclerosis (ICD10-I70.0). Electronically Signed   By: Dorise Bullion III M.D   On: 09/20/2017 20:33   Dg Ankle Complete Right  Result Date: 09/22/2017 CLINICAL DATA:  Right ankle pain.  No injury. EXAM: RIGHT ANKLE - COMPLETE 3+ VIEW COMPARISON:  Right ankle x-rays dated October 07, 2015. FINDINGS: No acute fracture or malalignment. Postsurgical changes related to prior medial and lateral malleolar ORIF without evidence of hardware failure or loosening. The talar dome is intact. The ankle mortise is symmetric. The bones are osteopenic. Probable small tibiotalar joint effusion. Soft tissues are unremarkable. IMPRESSION: 1.  No acute osseous abnormality. 2. Prior medial and lateral malleolar ORIF without evidence of hardware complication. Electronically Signed   By: Titus Dubin M.D.   On: 09/22/2017 12:46   Ct Abdomen Pelvis W Contrast  Result Date: 09/26/2017 CLINICAL DATA:  Followup infectious gastroenteritis and colitis. EXAM: CT ABDOMEN AND PELVIS WITH CONTRAST TECHNIQUE: Multidetector CT imaging of the abdomen and pelvis was performed using the standard protocol following bolus administration of intravenous contrast. CONTRAST:  138m ISOVUE-300 IOPAMIDOL (ISOVUE-300) INJECTION 61% COMPARISON:  09/20/2017.  06/28/2017. FINDINGS: Lower chest: Normal Hepatobiliary: Hepatomegaly again demonstrated. No focal lesion. Previous cholecystectomy. Pancreas: Normal Spleen: Splenomegaly again demonstrated.  No focal lesion. Adrenals/Urinary Tract: Adrenal glands  are normal. Right kidney is normal. Stable chronic scarring/ atrophy of the left kidney. No hydronephrosis. Stomach/Bowel: No change since the previous study. No bowel abnormality is definable by imaging. Normal appearing appendix. Minimal edema in the right lower quadrant mesentery has not changed. Doubtful significance. Vascular/Lymphatic: Aortic atherosclerosis. No aneurysm. Upper abdominal varices. Reproductive: Normal Other: No free fluid or air.  No abscess. Musculoskeletal: Normal IMPRESSION: No significant change since the study of 5 days ago. No acute finding other than mild edema in the right lower quadrant mesentery of doubtful significance. Chronic hepato splenomegaly. Electronically Signed   By: MNelson ChimesM.D.   On: 09/26/2017 07:34   Dg Chest Port 1 View  Result Date: 09/20/2017 CLINICAL DATA:  Fever. EXAM: PORTABLE CHEST 1 VIEW COMPARISON:  06/30/2017 FINDINGS: The heart size and mediastinal contours are within normal limits. Both lungs are clear. The visualized skeletal structures are unremarkable. IMPRESSION: No active disease. Electronically Signed   By: JEarle GellM.D.   On: 09/20/2017 16:06    Lab Data:  CBC: Recent Labs  Lab 09/21/17 0404 09/21/17 0941 09/22/17 0700 09/23/17 0357 09/24/17 0514 09/25/17 0250 09/26/17 0318  WBC 2.5* 2.6* 3.8* 3.3* 3.3* 3.4* 3.3*  NEUTROABS 1.9 1.9  --   --   --   --   --   HGB 7.7* 7.9* 8.5* 7.5* 7.5* 7.5* 8.1*  HCT 23.7* 24.2* 25.8* 22.3* 22.7* 22.5* 24.6*  MCV 107.7* 108.0* 106.6* 106.2* 104.1* 104.2* 104.7*  PLT 81* 81* 74* 78* 83* 107* 1782   Basic Metabolic Panel: Recent Labs  Lab 09/22/17 0700 09/23/17 0357 09/23/17 0806 09/24/17 0514 09/25/17 0250 09/26/17 0318  NA 133* 133*  --  132* 131* 135  K 3.9 2.8*  --  3.1* 3.6 3.9  CL 101 103  --  103 104 105  CO2 19* 18*  --  18* 20* 20*  GLUCOSE 89 123*  --  89 83 195*  BUN 13 7  --  5* <5* <5*  CREATININE 1.25* 1.03*  --  0.82 0.73 0.67  CALCIUM 7.7* 7.7*  --  8.0*  8.2* 8.8*  MG  --   --  1.5*  --  1.9  --    GFR: Estimated Creatinine Clearance: 74.8 mL/min (by C-G formula based on SCr of 0.67 mg/dL). Liver Function Tests: Recent Labs  Lab 09/20/17 1405  AST 57*  ALT 15  ALKPHOS 167*  BILITOT 3.9*  PROT 7.2  ALBUMIN 3.0*   Recent Labs  Lab 09/20/17 1405  LIPASE 39   No results for input(s): AMMONIA in the last 168 hours. Coagulation Profile: Recent Labs  Lab 09/20/17 1521  INR 1.73   Cardiac Enzymes: No results for input(s): CKTOTAL, CKMB, CKMBINDEX, TROPONINI in the last 168 hours. BNP (last 3 results) No results for input(s): PROBNP in the last 8760 hours. HbA1C: No results for input(s): HGBA1C in the last 72 hours. CBG: No results for input(s): GLUCAP in the last 168 hours. Lipid Profile: No results for input(s): CHOL, HDL, LDLCALC, TRIG, CHOLHDL, LDLDIRECT in the last 72 hours. Thyroid Function Tests: No results for input(s): TSH, T4TOTAL, FREET4, T3FREE, THYROIDAB in the last 72 hours. Anemia Panel: No results for input(s): VITAMINB12, FOLATE, FERRITIN, TIBC, IRON, RETICCTPCT in the last 72 hours. Urine analysis:    Component Value Date/Time   COLORURINE YELLOW 09/20/2017 1528   APPEARANCEUR HAZY (A) 09/20/2017 1528   LABSPEC 1.013 09/20/2017 1528   PHURINE 8.0 09/20/2017 1528   GLUCOSEU NEGATIVE 09/20/2017 1528   HGBUR NEGATIVE 09/20/2017 [place] 09/20/2017 1528   BILIRUBINUR negative 05/19/2016 1638   KETONESUR NEGATIVE 09/20/2017 1528   PROTEINUR 30 (A) 09/20/2017 1528   UROBILINOGEN 0.2 05/19/2016 1638   UROBILINOGEN 0.2 08/17/2015 1853   NITRITE NEGATIVE 09/20/2017 1528   LEUKOCYTESUR NEGATIVE 09/20/2017 1528     Jamie Allen M.D. [facility] 09/26/2017, 1:36 PM  Pager: [phone] Between 7am to 7pm - call Pager - [phone]  After 7pm go to [email or web address] - password [email or web address]  Call night coverage person covering after 7pm

## 2017-09-26 NOTE — Progress Notes (Signed)
[facility] for Infectious Disease    Date of Admission:  09/20/2017   Total days of antibiotics 7        Day 7 high dose oral vanco and iv metronidazole           ID: Jamie Allen is a 46 y.o. 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 difficile enteritis   Pancytopenia (HCC)    Subjective: Felt ok this morning. Less bowel movements yesterday but then after eating breakfast, having nausea/vomiting. Unclear how many BM she is havign She underwent CT yesterday that did not show any worsening colitis  Medications:  . feeding supplement  1 Container Oral TID BM  . folic acid  1 mg Oral Daily  . gabapentin  300 mg Oral QHS  . levothyroxine  75 mcg Oral QAC breakfast  . potassium chloride  20 mEq Oral Daily  . thiamine  100 mg Oral Daily  . vancomycin  500 mg Oral Q6H    Objective: Vital signs in last 24 hours: Temp:  [97.6 F (36.4 C)-97.9 F (36.6 C)] 97.6 F (36.4 C) (12/08 1230) Pulse Rate:  [86] 86 (12/08 1230) Resp:  [16-20] 20 (12/08 1230) BP: (114-127)/(52-65) 116/52 (12/08 0404) SpO2:  [98 %] 98 % (12/08 0404) Weight:  [139 lb 3.2 oz (63.1 kg)] 139 lb 3.2 oz (63.1 kg) (12/08 0404) Physical Exam  Constitutional:  oriented to person, place, and time. appears well-developed and well-nourished. No distress.  HENT: [place]/AT, PERRLA, no scleral icterus Mouth/Throat: Oropharynx is clear and moist. No oropharyngeal exudate.  Cardiovascular: Normal rate, regular rhythm and normal heart sounds. Exam reveals no gallop and no friction rub.  No murmur heard.  Pulmonary/Chest: Effort normal and breath sounds normal. No respiratory distress.  has no wheezes.  Neck = supple, no nuchal rigidity Abdominal: Soft. Bowel sounds are decrease/sparse.  moderate  distension. no tenderness.  Lymphadenopathy: no cervical adenopathy. No axillary adenopathy Neurological: alert and oriented to person, place, and time.  Skin: Skin is warm and dry. No rash noted. No erythema.  Psychiatric: a normal mood and affect.  behavior is normal.   Lab Results Recent Labs    09/25/17 0250 09/26/17 0318  WBC 3.4* 3.3*  HGB 7.5* 8.1*  HCT 22.5* 24.6*  NA 131* 135  K 3.6 3.9  CL 104 105  CO2 20* 20*  BUN <5* <5*  CREATININE 0.73 0.67    Microbiology: cdiff PCR+, antigen +, toxin - Studies/Results: Ct Abdomen Pelvis W Contrast  Result Date: 09/26/2017 CLINICAL DATA:  Followup infectious gastroenteritis and colitis. EXAM: CT ABDOMEN AND PELVIS WITH CONTRAST TECHNIQUE: Multidetector CT imaging of the abdomen and pelvis was performed using the standard protocol following bolus administration of intravenous contrast. CONTRAST:  175mL ISOVUE-300 IOPAMIDOL (ISOVUE-300) INJECTION 61% COMPARISON:  09/20/2017.  06/28/2017. FINDINGS: Lower chest: Normal Hepatobiliary: Hepatomegaly again demonstrated. No focal lesion. Previous cholecystectomy. Pancreas: Normal Spleen: Splenomegaly again demonstrated.  No focal lesion. Adrenals/Urinary Tract: Adrenal glands are normal. Right kidney is normal. Stable chronic scarring/ atrophy of the left kidney. No hydronephrosis. Stomach/Bowel: No change since the previous study. No bowel abnormality is definable by imaging. Normal appearing appendix. Minimal edema in the right lower quadrant mesentery has not changed. Doubtful significance. Vascular/Lymphatic: Aortic atherosclerosis. No aneurysm. Upper abdominal  varices. Reproductive: Normal Other: No free fluid or air.  No abscess. Musculoskeletal: Normal IMPRESSION: No significant change since the study of 5 days ago. No acute finding other than mild edema in the right lower quadrant mesentery of doubtful significance. Chronic hepato splenomegaly. Electronically Signed   By: Nelson Chimes M.D.    On: 09/26/2017 07:34     Assessment/Plan: Cdifficile, refractory= I suspect this is still from her previous bout in September.  difficult to say if she is improving over the last day, but does not appear to be worsening. CT abd is reassuring I wonder if her nausea is related to the metronidazole IV . We will discontinue that today to see if she is having improvement on oral vancomycin alone. She needs pen and paper to track her BM instead of recall since she is somewhat poor historian  N/v = abtx SE vs. Related to cdiff colitis. Will pull of metronidazole to see if any improvement  Pruritis = recommend doing atarax BID-TID to see if that helps  Leukopenia/thrombocytopenia and easy bruising=sequelae of liver disease.  Baxter Flattery [facility] for Infectious Diseases Cell: [phone] Pager: [phone]  09/26/2017, 4:46 PM

## 2017-09-27 MED ORDER — DICYCLOMINE HCL 10 MG PO CAPS
10.0000 mg | ORAL_CAPSULE | Freq: Three times a day (TID) | ORAL | Status: DC
  Administered 2017-09-27 – 2017-09-28 (×3): 10 mg via ORAL
  Filled 2017-09-27 (×3): qty 1

## 2017-09-27 NOTE — Progress Notes (Signed)
[facility] for Infectious Disease    Date of Admission:  09/20/2017   Total days of antibiotics 8        Day 8 high dose oral vanco            ID: Jamie Allen is a 46 y.o. 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 difficile enteritis   Pancytopenia (HCC)    Subjective: Patient states that she feels better this morning. She had little bowel movements last night, able to sleep. She is under the impression she is to have an FMT during this hospitalization, which I have clarified that is not the case and that we would need her on cdiff treatment longer  Medications:  . feeding supplement  1 Container Oral TID BM  . folic acid  1 mg Oral Daily  . gabapentin  300 mg Oral QHS  . hydrOXYzine  10 mg Oral BID  . levothyroxine  75 mcg Oral QAC breakfast  . potassium chloride  20 mEq Oral Daily  . thiamine  100 mg Oral Daily  . vancomycin  500 mg Oral Q6H    Objective: Vital signs in last 24 hours: Temp:  [97.4 F (36.3 C)-98.3 F (36.8 C)] 98.3 F (36.8 C) (12/09 1227) Pulse Rate:  [69-94] 91 (12/09 1227) Resp:  [14-19] 19 (12/09 1227) BP: (102-143)/(53-75) 113/61 (12/09 1227) SpO2:  [96 %-99 %] 98 % (12/09 1227) Weight:  [141 lb 3.2 oz (64 kg)] 141 lb 3.2 oz (64 kg) (12/09 0440) Physical Exam  Constitutional:  oriented to person, place, and time. appears rested and well-nourished. No distress.  HENT: [place]/AT, PERRLA, no scleral icterus Mouth/Throat: Oropharynx is clear and moist. No oropharyngeal exudate.  Cardiovascular: Normal rate, regular rhythm and normal heart sounds. Exam reveals no gallop and no friction rub.  No murmur heard.  Pulmonary/Chest: Effort normal and breath sounds normal. No respiratory distress.  has no wheezes.  Neck = supple,  no nuchal rigidity Abdominal: Soft. Bowel sounds are slow.  moderate distension. no tenderness.  Lymphadenopathy: no cervical adenopathy. No axillary adenopathy Neurological: alert and oriented to person, place, and time.  Skin: Skin is warm and dry. No rash noted. No erythema.  Psychiatric: emotionally labile,  Lab Results Recent Labs    09/25/17 0250 09/26/17 0318  WBC 3.4* 3.3*  HGB 7.5* 8.1*  HCT 22.5* 24.6*  NA 131* 135  K 3.6 3.9  CL 104 105  CO2 20* 20*  BUN <5* <5*  CREATININE 0.73 0.67    Microbiology: cdiff PCR+, antigen +, toxin - Studies/Results: Ct Abdomen Pelvis W Contrast  Result Date: 09/26/2017 CLINICAL DATA:  Followup infectious gastroenteritis and colitis. EXAM: CT ABDOMEN AND PELVIS WITH CONTRAST TECHNIQUE: Multidetector CT imaging of the abdomen and pelvis was performed using the standard protocol following bolus administration of intravenous contrast. CONTRAST:  127mL ISOVUE-300 IOPAMIDOL (ISOVUE-300) INJECTION 61% COMPARISON:  09/20/2017.  06/28/2017. FINDINGS: Lower chest: Normal Hepatobiliary: Hepatomegaly again demonstrated. No focal lesion. Previous cholecystectomy. Pancreas: Normal Spleen: Splenomegaly again demonstrated.  No focal lesion. Adrenals/Urinary Tract: Adrenal glands are normal. Right kidney is normal. Stable chronic scarring/ atrophy of the left kidney. No hydronephrosis. Stomach/Bowel: No change since the previous study. No bowel abnormality is definable by imaging. Normal appearing appendix. Minimal edema in  the right lower quadrant mesentery has not changed. Doubtful significance. Vascular/Lymphatic: Aortic atherosclerosis. No aneurysm. Upper abdominal varices. Reproductive: Normal Other: No free fluid or air.  No abscess. Musculoskeletal: Normal IMPRESSION: No significant change since the study of 5 days ago. No acute finding other than mild edema in the right lower quadrant mesentery of doubtful significance. Chronic hepato splenomegaly.  Electronically Signed   By: Nelson Chimes M.D.   On: 09/26/2017 07:34     Assessment/Plan: Cdifficile, refractory= I suspect this is still from her previous bout in September.  Appears improving. Continue with oral vancomycin 500mg  po QID. When she is ready for discharge, she needs a prolonged taper that needs to be given to her in hand- concern that she does not have means to get medication  If she has further episode of cdiff - would consider FMT. Would like to see how she does with vanco taper before FMT  I think this is challenging given that her baseline chronic diarrhea gives her 3-4 BM per day.  Diarrhea = is likely multifactorial - currently, I am not convinced that all her diarrhea is due to cdifficile. 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 At that time, it was recommended to start bentyl. I think this maybe of use for her since she reports having abdominal discomfort. She has had significant work up in the past done by GI  Dr Havery Moros is back on GI consult service tomorrow, would recommend to touch base to see if anything else should be added to her regimen  N/v = abtx SE vs. Related to cdiff colitis. Appears improved since stopping iv metronidazole on 12/8. Continue to monitor.   Pruritis = recommend doing atarax BID-TID to see if that helps  Leukopenia/thrombocytopenia and easy bruising=sequelae of liver disease.  Baxter Flattery [facility] Cell: [phone] Pager: [phone]  09/27/2017, 2:09 PM

## 2017-09-27 NOTE — Progress Notes (Signed)
[facility]                                                                              Patient Demographics  Jamie Allen, is a 46 y.o. female, DOB - 01/30/1971, XBM:[id number]  Admit date - 09/20/2017   Admitting Physician Etta Quill, DO  Outpatient Primary MD for the patient is Ladell Pier, MD  Outpatient specialists:   LOS - 7  days   Medical records reviewed and are as summarized below:    Chief Complaint  Patient presents with  . Emesis       Brief summary   Jamie Allen is a 46 y.o. female with a history of c diff, anemia, cirrhosis, alcohol abuse. She presented with abdominal pain with fever and is now found to have severe C. Difficile colitis    Assessment & Plan    Principal Problem:   Sepsis associated hypotension ([place]) -Patient met sepsis criteria at the time of admission with hypotension, tachycardia, acute C. difficile fulminant colitis.  Active Problems: Fulminant C. 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 -CT abdomen and pelvis showed no toxic megacolon, per patient symptoms are slowly improving -IV Flagyl was discontinued yesterday, feeling better today, had 4 BMs last night which is significant improvement    Alcoholic cirrhosis of liver without ascites (HCC) -Currently stable, no acute issues    Anemia of chronic disease, pancytopenia -Likely due to liver disease, status post 1 unit packed RBCs -H&H stable  Right ankle pain -History of fracture status post ORIF in 2016, -Right ankle x-ray on 12/4 showed no acute osseous abnormality, prior medial and lateral malleolus ORIF without evidence of hardware complication   Hypokalemia, hypomagnesemia -Recheck BMET in a.m.  Code Status: full  DVT Prophylaxis:  SCD's Family Communication: Discussed in detail with  the patient, all imaging results, lab results explained to the patient   Disposition Plan: Hopefully DC home in a.m., will need case management assistance for obtaining oral vancomycin  Time Spent in minutes   15 minutes  Procedures:    Consultants:   Infectious disease, Dr. Johnnye Sima  Antimicrobials:      Medications  Scheduled Meds: . feeding supplement  1 Container Oral TID BM  . folic acid  1 mg Oral Daily  . gabapentin  300 mg Oral QHS  . hydrOXYzine  10 mg Oral BID  . levothyroxine  75 mcg Oral QAC breakfast  . potassium chloride  20 mEq Oral Daily  . thiamine  100 mg Oral Daily  . vancomycin  500 mg Oral Q6H   Continuous Infusions:  PRN Meds:.acetaminophen, alum & mag hydroxide-simeth, diphenhydrAMINE, Gerhardt's butt cream, HYDROmorphone (DILAUDID) injection, nicotine polacrilex, ondansetron **OR** ondansetron (ZOFRAN) IV, oxyCODONE, promethazine, promethazine   Antibiotics   Anti-infectives (From admission, onward)   Start     Dose/Rate Route Frequency Ordered Stop   09/23/17 2000  metroNIDAZOLE (FLAGYL) IVPB 500 mg  Status:  Discontinued     500 mg 100 mL/hr over  60 Minutes Intravenous Every 8 hours 09/23/17 1556 09/26/17 1655   09/21/17 2200  cefTRIAXone (ROCEPHIN) 2 g in dextrose 5 % 50 mL IVPB  Status:  Discontinued     2 g 100 mL/hr over 30 Minutes Intravenous Every 24 hours 09/20/17 2314 09/20/17 2328   09/21/17 1300  vancomycin (VANCOCIN) 50 mg/mL oral solution 500 mg     500 mg Oral Every 6 hours 09/21/17 1110     09/21/17 1200  vancomycin (VANCOCIN) 50 mg/mL oral solution 500 mg  Status:  Discontinued     500 mg Oral Every 6 hours 09/21/17 0857 09/21/17 0925   09/21/17 1200  vancomycin (VANCOCIN) 500 mg in sodium chloride irrigation 0.9 % 100 mL ENEMA  Status:  Discontinued     500 mg Rectal Every 6 hours 09/21/17 0925 09/21/17 1110   09/21/17 0600  cefTRIAXone (ROCEPHIN) 2 g in dextrose 5 % 50 mL IVPB  Status:  Discontinued     2 g 100 mL/hr over  30 Minutes Intravenous Every 24 hours 09/20/17 2328 09/21/17 0857   09/21/17 0000  metroNIDAZOLE (FLAGYL) IVPB 500 mg  Status:  Discontinued     500 mg 100 mL/hr over 60 Minutes Intravenous Every 8 hours 09/20/17 2305 09/23/17 1556   09/20/17 2315  cefTRIAXone (ROCEPHIN) 2 g in dextrose 5 % 50 mL IVPB  Status:  Discontinued     2 g 100 mL/hr over 30 Minutes Intravenous  Once 09/20/17 2314 09/21/17 0857   09/20/17 2200  piperacillin-tazobactam (ZOSYN) IVPB 3.375 g  Status:  Discontinued     3.375 g 12.5 mL/hr over 240 Minutes Intravenous Every 8 hours 09/20/17 1537 09/20/17 2305   09/20/17 1530  piperacillin-tazobactam (ZOSYN) IVPB 3.375 g     3.375 g 100 mL/hr over 30 Minutes Intravenous  Once 09/20/17 1521 09/20/17 1646        Subjective:   Jamie Allen was seen and examined today.  States diarrhea is improving, had abdominal pain last night which is now improving.  No fevers or chills.  Nausea is better.  Patient denies dizziness, chest pain, shortness of breath, new weakness, numbess, tingling.  Objective:   Vitals:   09/26/17 2029 09/27/17 0025 09/27/17 0440 09/27/17 0922  BP: 138/62 (!) 102/53 124/75 114/60  Pulse: 86 69 94 78  Resp: _0 Temp: (!) 97.4 F (36.3 C) 97.7 F (36.5 C) 97.9 F (36.6 C) 97.9 F (36.6 C)  TempSrc: Oral Oral Oral Oral  SpO2: 99%  96% 97%  Weight:   64 kg (141 lb 3.2 oz)   Height:        Intake/Output Summary (Last 24 hours) at 09/27/2017 1138 Last data filed at 09/27/2017 0924 Gross per 24 hour  Intake 1175 ml  Output -  Net 1175 ml     Wt Readings from Last 3 Encounters:  09/27/17 64 kg (141 lb 3.2 oz)  09/02/17 61 kg (134 lb 6.4 oz)  08/21/17 60.8 kg (134 lb 2 oz)     Exam    General: Alert and oriented x 3, NAD  Eyes:   HEENT:  Atraumatic, normocephalic  Cardiovascular: S1 S2 clear, RRR. No pedal edema b/l  Respiratory: Clear to auscultation bilaterally, no wheezing, rales or rhonchi  Gastrointestinal: Soft,  mildly tender, nondistended, + bowel sounds  Ext: no pedal edema bilaterally  Neuro: no new deficits  Musculoskeletal: No digital cyanosis, clubbing  Skin: No rashes  Psych: Normal affect and demeanor, alert and oriented  x3    Data Reviewed:  I have personally reviewed following labs and imaging studies  Micro Results Recent Results (from the past 240 hour(s))  Blood Culture (routine x 2)     Status: None   Collection Time: 09/20/17  3:15 PM  Result Value Ref Range Status   Specimen Description BLOOD LEFT ANTECUBITAL  Final   Special Requests   Final    BOTTLES DRAWN AEROBIC AND ANAEROBIC Blood Culture adequate volume   Culture NO GROWTH 5 DAYS  Final   Report Status 09/25/2017 FINAL  Final  Blood Culture (routine x 2)     Status: None   Collection Time: 09/20/17  3:25 PM  Result Value Ref Range Status   Specimen Description BLOOD LEFT HAND  Final   Special Requests   Final    BOTTLES DRAWN AEROBIC AND ANAEROBIC Blood Culture adequate volume   Culture NO GROWTH 5 DAYS  Final   Report Status 09/25/2017 FINAL  Final  C difficile quick scan w PCR reflex     Status: Abnormal   Collection Time: 09/20/17  3:25 PM  Result Value Ref Range Status   C Diff antigen POSITIVE (A) NEGATIVE Final   C Diff toxin NEGATIVE NEGATIVE Final   C Diff interpretation Results are indeterminate. See PCR results.  Final  Clostridium Difficile by PCR     Status: Abnormal   Collection Time: 09/20/17  3:25 PM  Result Value Ref Range Status   Toxigenic C Difficile by pcr POSITIVE (A) NEGATIVE Final    Comment: Positive for toxigenic C. difficile with little to no toxin production. Only treat if clinical presentation suggests symptomatic illness.  MRSA PCR Screening     Status: None   Collection Time: 09/22/17 11:01 AM  Result Value Ref Range Status   MRSA by PCR NEGATIVE NEGATIVE Final    Comment:        The GeneXpert MRSA Assay (FDA approved for NASAL specimens only), is one component of  a comprehensive MRSA colonization surveillance program. It is not intended to diagnose MRSA infection nor to guide or monitor treatment for MRSA infections.     Radiology Reports Ct Abdomen Pelvis Wo Contrast  Result Date: 09/20/2017 CLINICAL DATA:  Right lower quadrant pain with nausea, vomiting, and diarrhea for 4 days. EXAM: CT ABDOMEN AND PELVIS WITHOUT CONTRAST TECHNIQUE: Multidetector CT imaging of the abdomen and pelvis was performed following the standard protocol without IV contrast. COMPARISON:  CT scan June 28, 2017 FINDINGS: Lower chest: No acute abnormality. Hepatobiliary: The liver is enlarged with a mildly nodular contour. No suspicious mass. Previous cholecystectomy. Pancreas: Unremarkable. No pancreatic ductal dilatation or surrounding inflammatory changes. Spleen: Stable splenomegaly. Adrenals/Urinary Tract: Adrenal glands are normal. The atrophic left kidney is stable. No renal stones, masses, or hydronephrosis. No perinephric stranding. The right ureter is normal with no stones. The bladder is unremarkable. Stomach/Bowel: The stomach is poorly evaluated due to lack of distention. The wall is mildly prominent, likely due to lack of distention. The small bowel is normal. Contrast is seen in the colon to the level of the rectum. The colon is normal. The appendix is normal, best seen on coronal images. There is some stranding and probably a small amount of fluid in the right pericolic gutter which abuts the appendix. However, this does not appear to arise from the appendix. Vascular/Lymphatic: Minimal atherosclerotic change in the abdominal aorta. No aneurysm. No adenopathy. Reproductive: Uterus and bilateral adnexa are unremarkable. Other: There is some stranding  and possibly a small amount of fluid in the right pericolic gutter extending towards midline such as on axial image 57 which is new since the previous study. An underlying cause is not identified. Adjacent structures are  normal in appearance. Musculoskeletal: No acute or significant osseous findings. IMPRESSION: 1. There is some increased attenuation in the fat of the right pericolic gutter and probably a tiny amount of fluid extending towards midline which is new since the previous study but is nonspecific. An underlying etiology is not seen. The appendix is adjacent to this finding but normal in appearance and there is no evidence of appendicitis on this study. 2. The liver is enlarged and mildly nodular.  Splenomegaly persists. 3. Mild atherosclerotic change. Aortic Atherosclerosis (ICD10-I70.0). Electronically Signed   By: Dorise Bullion III M.D   On: 09/20/2017 20:33   Dg Ankle Complete Right  Result Date: 09/22/2017 CLINICAL DATA:  Right ankle pain.  No injury. EXAM: RIGHT ANKLE - COMPLETE 3+ VIEW COMPARISON:  Right ankle x-rays dated October 07, 2015. FINDINGS: No acute fracture or malalignment. Postsurgical changes related to prior medial and lateral malleolar ORIF without evidence of hardware failure or loosening. The talar dome is intact. The ankle mortise is symmetric. The bones are osteopenic. Probable small tibiotalar joint effusion. Soft tissues are unremarkable. IMPRESSION: 1.  No acute osseous abnormality. 2. Prior medial and lateral malleolar ORIF without evidence of hardware complication. Electronically Signed   By: Titus Dubin M.D.   On: 09/22/2017 12:46   Ct Abdomen Pelvis W Contrast  Result Date: 09/26/2017 CLINICAL DATA:  Followup infectious gastroenteritis and colitis. EXAM: CT ABDOMEN AND PELVIS WITH CONTRAST TECHNIQUE: Multidetector CT imaging of the abdomen and pelvis was performed using the standard protocol following bolus administration of intravenous contrast. CONTRAST:  139m ISOVUE-300 IOPAMIDOL (ISOVUE-300) INJECTION 61% COMPARISON:  09/20/2017.  06/28/2017. FINDINGS: Lower chest: Normal Hepatobiliary: Hepatomegaly again demonstrated. No focal lesion. Previous cholecystectomy. Pancreas:  Normal Spleen: Splenomegaly again demonstrated.  No focal lesion. Adrenals/Urinary Tract: Adrenal glands are normal. Right kidney is normal. Stable chronic scarring/ atrophy of the left kidney. No hydronephrosis. Stomach/Bowel: No change since the previous study. No bowel abnormality is definable by imaging. Normal appearing appendix. Minimal edema in the right lower quadrant mesentery has not changed. Doubtful significance. Vascular/Lymphatic: Aortic atherosclerosis. No aneurysm. Upper abdominal varices. Reproductive: Normal Other: No free fluid or air.  No abscess. Musculoskeletal: Normal IMPRESSION: No significant change since the study of 5 days ago. No acute finding other than mild edema in the right lower quadrant mesentery of doubtful significance. Chronic hepato splenomegaly. Electronically Signed   By: MNelson ChimesM.D.   On: 09/26/2017 07:34   Dg Chest Port 1 View  Result Date: 09/20/2017 CLINICAL DATA:  Fever. EXAM: PORTABLE CHEST 1 VIEW COMPARISON:  06/30/2017 FINDINGS: The heart size and mediastinal contours are within normal limits. Both lungs are clear. The visualized skeletal structures are unremarkable. IMPRESSION: No active disease. Electronically Signed   By: JEarle GellM.D.   On: 09/20/2017 16:06    Lab Data:  CBC: Recent Labs  Lab 09/21/17 0404 09/21/17 0941 09/22/17 0700 09/23/17 0357 09/24/17 0514 09/25/17 0250 09/26/17 0318  WBC 2.5* 2.6* 3.8* 3.3* 3.3* 3.4* 3.3*  NEUTROABS 1.9 1.9  --   --   --   --   --   HGB 7.7* 7.9* 8.5* 7.5* 7.5* 7.5* 8.1*  HCT 23.7* 24.2* 25.8* 22.3* 22.7* 22.5* 24.6*  MCV 107.7* 108.0* 106.6* 106.2* 104.1* 104.2* 104.7*  PLT  81* 81* 74* 78* 83* 107* 861*   Basic Metabolic Panel: Recent Labs  Lab 09/22/17 0700 09/23/17 0357 09/23/17 0806 09/24/17 0514 09/25/17 0250 09/26/17 0318  NA 133* 133*  --  132* 131* 135  K 3.9 2.8*  --  3.1* 3.6 3.9  CL 101 103  --  103 104 105  CO2 19* 18*  --  18* 20* 20*  GLUCOSE 89 123*  --  89 83  195*  BUN 13 7  --  5* <5* <5*  CREATININE 1.25* 1.03*  --  0.82 0.73 0.67  CALCIUM 7.7* 7.7*  --  8.0* 8.2* 8.8*  MG  --   --  1.5*  --  1.9  --    GFR: Estimated Creatinine Clearance: 75.3 mL/min (by C-G formula based on SCr of 0.67 mg/dL). Liver Function Tests: Recent Labs  Lab 09/20/17 1405  AST 57*  ALT 15  ALKPHOS 167*  BILITOT 3.9*  PROT 7.2  ALBUMIN 3.0*   Recent Labs  Lab 09/20/17 1405  LIPASE 39   No results for input(s): AMMONIA in the last 168 hours. Coagulation Profile: Recent Labs  Lab 09/20/17 1521  INR 1.73   Cardiac Enzymes: No results for input(s): CKTOTAL, CKMB, CKMBINDEX, TROPONINI in the last 168 hours. BNP (last 3 results) No results for input(s): PROBNP in the last 8760 hours. HbA1C: No results for input(s): HGBA1C in the last 72 hours. CBG: No results for input(s): GLUCAP in the last 168 hours. Lipid Profile: No results for input(s): CHOL, HDL, LDLCALC, TRIG, CHOLHDL, LDLDIRECT in the last 72 hours. Thyroid Function Tests: No results for input(s): TSH, T4TOTAL, FREET4, T3FREE, THYROIDAB in the last 72 hours. Anemia Panel: No results for input(s): VITAMINB12, FOLATE, FERRITIN, TIBC, IRON, RETICCTPCT in the last 72 hours. Urine analysis:    Component Value Date/Time   COLORURINE YELLOW 09/20/2017 1528   APPEARANCEUR HAZY (A) 09/20/2017 1528   LABSPEC 1.013 09/20/2017 1528   PHURINE 8.0 09/20/2017 1528   GLUCOSEU NEGATIVE 09/20/2017 1528   HGBUR NEGATIVE 09/20/2017 [place] 09/20/2017 1528   BILIRUBINUR negative 05/19/2016 1638   KETONESUR NEGATIVE 09/20/2017 1528   PROTEINUR 30 (A) 09/20/2017 1528   UROBILINOGEN 0.2 05/19/2016 1638   UROBILINOGEN 0.2 08/17/2015 1853   NITRITE NEGATIVE 09/20/2017 1528   LEUKOCYTESUR NEGATIVE 09/20/2017 1528     Rhianne Soman M.D. [facility] 09/27/2017, 11:38 AM  Pager: [phone] Between 7am to 7pm - call Pager - [phone]  After 7pm go to [email or web address] - password  [email or web address]  Call night coverage person covering after 7pm

## 2017-09-27 NOTE — Care Management Note (Signed)
Case Management Note  Patient Details  Name: Jamie Allen MRN: [id number] Date of Birth: 1971/04/26  Subjective/Objective:       Pt presented for recurrence of CDiff because unable to get last script filled after admission in 9/18.  Pt has been receiving oral Vancomycin 4x's/day since admission 7 days ago.  Pt will need 5 more weeks of oral vancomycin. MD asks that pt be given medication prior to d/c.    Pt uses [facility] and pharmacy.  She states that [facility] were unable to fill script after last admission.  Pt states she also tried [facility] OP pharmacy and the total cost would have been $1100.  She states the pharmacist was able to lower the total cost to $450 but she still could not afford it.    Pt has been denied Medicaid and is in the process of applying for Disability.     Oral Vancomycin is not typically carried by all pharmacies and some pharmacies will be closed tomorrow due to inclement weather.       Action/Plan: Spoke to pharmacist here in [place] to explore the possibility of filling all 5 weeks for pt here prior to d/c.  Pharmacist states CM should speak to [facility] in pharmacy tomorrow to further explore that possibility.  The dose is tapered each week so 57 doses will be needed.  [place] program is limited to $500 unless override is issued.  CM will f/u tomorrow.     Expected Discharge Date:                  Expected Discharge Plan:  Home/Self Care  In-House Referral:  NA  Discharge planning Services  CM Consult, [place] Program, Medication Assistance  Post Acute Care Choice:  NA Choice offered to:  NA  DME Arranged:    DME Agency:     HH Arranged:    HH Agency:     Status of Service:  In process, will continue to follow  If discussed at Long Length of Stay Meetings, dates discussed:    Additional Comments:  Arley Phenix, RN 09/27/2017, 2:14 PM

## 2017-09-27 NOTE — Progress Notes (Signed)
Patient's pain level has been up in the 7 to 8 and only has come down to a 5.  MD increase Dilaudid 1-2 mg.  I have been given the patient 2 mg and it has relief her pain to a 5 and has been able to sleep.  She did have a period of having some nausea after eating some food, gave Zofran to help with the nausea. Will keep monitoring patient.

## 2017-09-28 DIAGNOSIS — D649 Anemia, unspecified: Secondary | ICD-10-CM

## 2017-09-28 DIAGNOSIS — D638 Anemia in other chronic diseases classified elsewhere: Secondary | ICD-10-CM

## 2017-09-28 LAB — BASIC METABOLIC PANEL
Anion gap: 9 (ref 5–15)
BUN: 6 mg/dL (ref 6–20)
CO2: 21 mmol/L — ABNORMAL LOW (ref 22–32)
Calcium: 9.1 mg/dL (ref 8.9–10.3)
Chloride: 109 mmol/L (ref 101–111)
Creatinine, Ser: 0.83 mg/dL (ref 0.44–1.00)
GFR calc Af Amer: >60 mL/min (ref >60)
GLUCOSE: 83 mg/dL (ref 65–99)
POTASSIUM: 3.5 mmol/L (ref 3.5–5.1)
Sodium: 139 mmol/L (ref 135–145)

## 2017-09-28 LAB — CBC
HEMATOCRIT: 25.4 % — AB (ref 36.0–46.0)
Hemoglobin: 8.1 g/dL — ABNORMAL LOW (ref 12.0–15.0)
MCH: 33.8 pg (ref 26.0–34.0)
MCHC: 31.9 g/dL (ref 30.0–36.0)
MCV: 105.8 fL — ABNORMAL HIGH (ref 78.0–100.0)
Platelets: 140 10*3/uL — ABNORMAL LOW (ref 150–400)
RBC: 2.4 MIL/uL — ABNORMAL LOW (ref 3.87–5.11)
RDW: 22.1 % — AB (ref 11.5–15.5)
WBC: 4.3 10*3/uL (ref 4.0–10.5)

## 2017-09-28 MED ORDER — VANCOMYCIN HCL 125 MG PO CAPS: ORAL_CAPSULE | ORAL | 0 refills | Status: DC

## 2017-09-28 MED ORDER — VANCOMYCIN 50 MG/ML ORAL SOLUTION: 125.0000 mg | ORAL | 0 refills | Status: DC

## 2017-09-28 MED ORDER — DICYCLOMINE HCL 10 MG PO CAPS: 10.0000 mg | ORAL_CAPSULE | Freq: Three times a day (TID) | ORAL | 3 refills | Status: DC

## 2017-09-28 MED ORDER — VANCOMYCIN 50 MG/ML ORAL SOLUTION: 125.0000 mg | Freq: Two times a day (BID) | ORAL | Status: DC

## 2017-09-28 MED ORDER — PROMETHAZINE HCL 25 MG PO TABS: 25.0000 mg | ORAL_TABLET | Freq: Three times a day (TID) | ORAL | 0 refills | Status: DC | PRN

## 2017-09-28 MED ORDER — VANCOMYCIN 50 MG/ML ORAL SOLUTION
125.0000 mg | Freq: Four times a day (QID) | ORAL | Status: DC
  Filled 2017-09-28 (×2): qty 2.5

## 2017-09-28 MED ORDER — HYDROMORPHONE HCL 1 MG/ML IJ SOLN
1.0000 mg | INTRAMUSCULAR | Status: DC | PRN
  Filled 2017-09-28: qty 1

## 2017-09-28 MED ORDER — VANCOMYCIN 50 MG/ML ORAL SOLUTION: 125.0000 mg | ORAL | Status: DC

## 2017-09-28 MED ORDER — VANCOMYCIN 50 MG/ML ORAL SOLUTION: 125.0000 mg | Freq: Four times a day (QID) | ORAL | 0 refills | Status: DC

## 2017-09-28 MED ORDER — HYDROXYZINE HCL 10 MG PO TABS: 10.0000 mg | ORAL_TABLET | Freq: Two times a day (BID) | ORAL | 3 refills | Status: DC

## 2017-09-28 MED ORDER — VANCOMYCIN 50 MG/ML ORAL SOLUTION: 125.0000 mg | Freq: Two times a day (BID) | ORAL | 0 refills | Status: DC

## 2017-09-28 MED ORDER — VANCOMYCIN 50 MG/ML ORAL SOLUTION: 125.0000 mg | Freq: Every day | ORAL | 0 refills | Status: DC

## 2017-09-28 MED ORDER — VANCOMYCIN 50 MG/ML ORAL SOLUTION: 125.0000 mg | Freq: Every day | ORAL | Status: DC

## 2017-09-28 MED ORDER — TRAMADOL HCL 50 MG PO TABS: 50.0000 mg | ORAL_TABLET | Freq: Four times a day (QID) | ORAL | 0 refills | Status: DC | PRN

## 2017-09-28 MED FILL — VANCOMYCIN HCL 125 MG CAP: 125 | 86 days supply | Qty: 101 | Fill #0

## 2017-09-28 MED FILL — PROMETHAZINE 25 MG TABLET: 25 | 10 days supply | Qty: 30 | Fill #0

## 2017-09-28 MED FILL — DICYCLOMINE 10 MG CAPSULE: 10 | 30 days supply | Qty: 90 | Fill #0

## 2017-09-28 MED FILL — hydrOXYzine HCL 10 MG TABS: 10 | 30 days supply | Qty: 60 | Fill #0

## 2017-09-28 NOTE — Progress Notes (Signed)
INFECTIOUS DISEASE PROGRESS NOTE  ID: Jamie Allen is a 46 y.o. female with  Principal Problem:   Sepsis associated hypotension ([name]) Active Problems:   Colitis presumed infectious   Alcoholic cirrhosis of liver without ascites (HCC)   Anemia of chronic disease   C. difficile enteritis   Pancytopenia (HCC)  Subjective: 1 BM so far today.  Loose.   Abtx:  Anti-infectives (From admission, onward)   Start     Dose/Rate Route Frequency Ordered Stop   09/28/17 0000  vancomycin (VANCOCIN HCL) 125 MG capsule  Status:  Discontinued        09/28/17 0830 09/28/17    09/28/17 0000  vancomycin (VANCOCIN HCL) 125 MG capsule        09/28/17 0835     09/23/17 2000  metroNIDAZOLE (FLAGYL) IVPB 500 mg  Status:  Discontinued     500 mg 100 mL/hr over 60 Minutes Intravenous Every 8 hours 09/23/17 1556 09/26/17 1655   09/21/17 2200  cefTRIAXone (ROCEPHIN) 2 g in dextrose 5 % 50 mL IVPB  Status:  Discontinued     2 g 100 mL/hr over 30 Minutes Intravenous Every 24 hours 09/20/17 2314 09/20/17 2328   09/21/17 1300  vancomycin (VANCOCIN) 50 mg/mL oral solution 500 mg     500 mg Oral Every 6 hours 09/21/17 1110     09/21/17 1200  vancomycin (VANCOCIN) 50 mg/mL oral solution 500 mg  Status:  Discontinued     500 mg Oral Every 6 hours 09/21/17 0857 09/21/17 0925   09/21/17 1200  vancomycin (VANCOCIN) 500 mg in sodium chloride irrigation 0.9 % 100 mL ENEMA  Status:  Discontinued     500 mg Rectal Every 6 hours 09/21/17 0925 09/21/17 1110   09/21/17 0600  cefTRIAXone (ROCEPHIN) 2 g in dextrose 5 % 50 mL IVPB  Status:  Discontinued     2 g 100 mL/hr over 30 Minutes Intravenous Every 24 hours 09/20/17 2328 09/21/17 0857   09/21/17 0000  metroNIDAZOLE (FLAGYL) IVPB 500 mg  Status:  Discontinued     500 mg 100 mL/hr over 60 Minutes Intravenous Every 8 hours 09/20/17 2305 09/23/17 1556   09/20/17 2315  cefTRIAXone (ROCEPHIN) 2 g in dextrose 5 % 50 mL IVPB  Status:  Discontinued     2 g 100 mL/hr  over 30 Minutes Intravenous  Once 09/20/17 2314 09/21/17 0857   09/20/17 2200  piperacillin-tazobactam (ZOSYN) IVPB 3.375 g  Status:  Discontinued     3.375 g 12.5 mL/hr over 240 Minutes Intravenous Every 8 hours 09/20/17 1537 09/20/17 2305   09/20/17 1530  piperacillin-tazobactam (ZOSYN) IVPB 3.375 g     3.375 g 100 mL/hr over 30 Minutes Intravenous  Once 09/20/17 1521 09/20/17 1646      Medications:  Scheduled: . dicyclomine  10 mg Oral TID AC  . feeding supplement  1 Container Oral TID BM  . folic acid  1 mg Oral Daily  . gabapentin  300 mg Oral QHS  . hydrOXYzine  10 mg Oral BID  . levothyroxine  75 mcg Oral QAC breakfast  . potassium chloride  20 mEq Oral Daily  . thiamine  100 mg Oral Daily  . vancomycin  500 mg Oral Q6H    Objective: Vital signs in last 24 hours: Temp:  [97.8 F (36.6 C)-98.2 F (36.8 C)] 98.2 F (36.8 C) (12/10 0536) Pulse Rate:  [91-94] 91 (12/10 0536) Resp:  [16] 16 (12/10 0536) BP: (110-126)/(52-64) 110/52 (12/10 0536) SpO2:  [  98 %] 98 % (12/10 0536)   General appearance: alert, cooperative and no distress Resp: clear to auscultation bilaterally Cardio: regular rate and rhythm GI: normal findings: bowel sounds normal and soft, non-tender  Lab Results Recent Labs    09/26/17 0318 09/28/17 0412  WBC 3.3* 4.3  HGB 8.1* 8.1*  HCT 24.6* 25.4*  NA 135 139  K 3.9 3.5  CL 105 109  CO2 20* 21*  BUN <5* 6  CREATININE 0.67 0.83   Liver Panel No results for input(s): PROT, ALBUMIN, AST, ALT, ALKPHOS, BILITOT, BILIDIR, IBILI in the last 72 hours. Sedimentation Rate No results for input(s): ESRSEDRATE in the last 72 hours. C-Reactive Protein No results for input(s): CRP in the last 72 hours.  Microbiology: Recent Results (from the past 240 hour(s))  Blood Culture (routine x 2)     Status: None   Collection Time: 09/20/17  3:15 PM  Result Value Ref Range Status   Specimen Description BLOOD LEFT ANTECUBITAL  Final   Special Requests    Final    BOTTLES DRAWN AEROBIC AND ANAEROBIC Blood Culture adequate volume   Culture NO GROWTH 5 DAYS  Final   Report Status 09/25/2017 FINAL  Final  Blood Culture (routine x 2)     Status: None   Collection Time: 09/20/17  3:25 PM  Result Value Ref Range Status   Specimen Description BLOOD LEFT HAND  Final   Special Requests   Final    BOTTLES DRAWN AEROBIC AND ANAEROBIC Blood Culture adequate volume   Culture NO GROWTH 5 DAYS  Final   Report Status 09/25/2017 FINAL  Final  C difficile quick scan w PCR reflex     Status: Abnormal   Collection Time: 09/20/17  3:25 PM  Result Value Ref Range Status   C Diff antigen POSITIVE (A) NEGATIVE Final   C Diff toxin NEGATIVE NEGATIVE Final   C Diff interpretation Results are indeterminate. See PCR results.  Final  Clostridium Difficile by PCR     Status: Abnormal   Collection Time: 09/20/17  3:25 PM  Result Value Ref Range Status   Toxigenic C Difficile by pcr POSITIVE (A) NEGATIVE Final    Comment: Positive for toxigenic C. difficile with little to no toxin production. Only treat if clinical presentation suggests symptomatic illness.  MRSA PCR Screening     Status: None   Collection Time: 09/22/17 11:01 AM  Result Value Ref Range Status   MRSA by PCR NEGATIVE NEGATIVE Final    Comment:        The GeneXpert MRSA Assay (FDA approved for NASAL specimens only), is one component of a comprehensive MRSA colonization surveillance program. It is not intended to diagnose MRSA infection nor to guide or monitor treatment for MRSA infections.     Studies/Results: No results found.   Assessment/Plan: Recurrent C diff  Total days of antibiotics: 8 po vanco  Will need prolonged vanco taper- this is written in her orders.  Please have [facility] arrange for her to get this at [place] as will be significantly less expensive.  Will place opat consult  Please arrange f/u with Dr Jamie Allen regarding stool txp.           Jamie Rumpf  MD, FACP Infectious Diseases (pager) [phone] [email or web address] [phone], 12:31 PM  LOS: 8 days

## 2017-09-28 NOTE — Discharge Summary (Addendum)
Physician Discharge Summary   Patient ID: Jamie Allen MRN: [id number] DOB/AGE: 47-11-1970 47 y.o.  Admit date: 09/20/2017 Discharge date: 124-Apr-202018  Primary Care Physician:  Ladell Pier, MD  Discharge Diagnoses:    . Acute fulminant C. difficile colitis, recurrent . Sepsis associated hypotension ([place]) . Alcoholic cirrhosis of liver without ascites ([place]) . Anemia of chronic disease Right ankle pain Hypokalemia, hypomagnesemia   Consults: Infectious disease  Recommendations for Outpatient Follow-up:  1. Patient recommended prolonged vancomycin taper and follow outpatient with infectious disease in 4 weeks. 2. Please repeat CBC/BMET at next visit 3. Patient will need referral for fecal transplant for recurrent C. difficile   DIET: Heart healthy diet    Allergies:   Allergies  Allergen Reactions  . Iohexol Hives, Itching and Swelling  . Morphine And Related Itching    Can tolerate with Benadryl     DISCHARGE MEDICATIONS: Allergies as of 124-Apr-202018      Reactions   Iohexol Hives, Itching, Swelling   Morphine And Related Itching   Can tolerate with Benadryl      Medication List    STOP taking these medications   IBUPROFEN PM PO     TAKE these medications   dicyclomine 10 MG capsule Commonly known as:  BENTYL Take 1 capsule (10 mg total) by mouth 3 (three) times daily before meals.   feeding supplement Liqd Take 1 Container by mouth 3 (three) times daily between meals.   folic acid 1 MG tablet Commonly known as:  FOLVITE Take 1 tablet (1 mg total) by mouth daily.   gabapentin 300 MG capsule Commonly known as:  NEURONTIN Take 1 capsule (300 mg total) by mouth at bedtime.   hydrOXYzine 10 MG tablet Commonly known as:  ATARAX/VISTARIL Take 1 tablet (10 mg total) by mouth 2 (two) times daily.   levothyroxine 75 MCG tablet Commonly known as:  SYNTHROID, LEVOTHROID Take 1 tablet (75 mcg total) by mouth daily before breakfast.    nicotine polacrilex 4 MG gum Commonly known as:  NICORETTE Take 4 mg by mouth 4 (four) times daily as needed for smoking cessation.   pregabalin 50 MG capsule Commonly known as:  LYRICA Take 1 capsule (50 mg total) by mouth 3 (three) times daily.   promethazine 25 MG tablet Commonly known as:  PHENERGAN Take 1 tablet (25 mg total) by mouth every 8 (eight) hours as needed for nausea or vomiting.   traMADol 50 MG tablet Commonly known as:  ULTRAM Take 1 tablet (50 mg total) by mouth every 6 (six) hours as needed.   vancomycin 50 mg/mL oral solution Commonly known as:  VANCOCIN Take 2.5 mLs (125 mg total) by mouth 4 (four) times daily. For 14 days  Please prescribe Oral vancomycin caps form   vancomycin 50 mg/mL oral solution Commonly known as:  VANCOCIN Take 2.5 mLs (125 mg total) by mouth 2 (two) times daily. X 7 days, start 10/12/17 Start taking on:  10/12/2017   vancomycin 50 mg/mL oral solution Commonly known as:  VANCOCIN Take 2.5 mLs (125 mg total) by mouth daily. For 7 days. Start 10/20/2017  Please prescribe Oral vancomycin caps form Start taking on:  10/20/2017   vancomycin 50 mg/mL oral solution Commonly known as:  VANCOCIN Take 2.5 mLs (125 mg total) by mouth every other day. For 28 days, starting 10/27/2017 Start taking on:  10/27/2017   vancomycin 50 mg/mL oral solution Commonly known as:  VANCOCIN Take 2.5 mLs (125 mg total) by  mouth every 3 (three) days. Starting 11/24/17 for 28 days Start taking on:  11/24/2017   VITAMIN B-1 PO Take 1 tablet by mouth daily.        Brief H and P: For complete details please refer to admission H and P, but in brief Jamie Allen a 46 y.o.female with a history of c diff, anemia, cirrhosis, alcohol abuse. She presented with abdominal pain with fever and is now found to have severe C. Difficile colitis     Hospital Course:   Sepsis associated hypotension ([place]) -Patient met sepsis criteria at the time of admission with  hypotension, tachycardia, acute C. difficile fulminant colitis.   Fulminant C. 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 -CT abdomen and pelvis showed no toxic megacolon, per patient symptoms are slowly improving -IV Flagyl was discontinued, patient's diarrhea has significantly improved.  She also has a history of IBS, Bentyl was restarted which also improved abdominal pain and spasms.   -Discussed in detail with ID, Dr. Johnnye Sima, recommended prolonged vancomycin taper for discharge, as per instructions in AVS.  - Case management consult was also obtained for [facility] and medication needs, Dr. Johnnye Sima will also discuss with pharmacy for oral vancomycin for the patient prior to the discharge  -Patient was also strongly recommended to follow-up with her gastroenterologist, Dr. Havery Moros regarding IBS, chronic diarrhea, patient reports that she has an appointment on January 8th.     Alcoholic cirrhosis of liver without ascites (HCC) -Currently stable, no acute issues    Anemia of chronic disease, pancytopenia -Likely due to liver disease, status post 1 unit packed RBCs -H&H stable  Right ankle pain -History of fracture status post ORIF in 2016, -Right ankle x-ray on 12/4 showed no acute osseous abnormality, prior medial and lateral malleolus ORIF without evidence of hardware complication   Hypokalemia, hypomagnesemia -Stable, potassium 3.5 at the time of discharge.     Day of Discharge BP (!) 110/52   Pulse 91   Temp 98.2 F (36.8 C)   Resp 16   Ht 5' 1"  (1.549 m)   Wt 64 kg (141 lb 3.2 oz)   LMP 06/01/2014   SpO2 98%   BMI 26.68 kg/m   Physical Exam: General: Alert and awake oriented x3 not in any acute distress. HEENT: anicteric sclera, pupils reactive to light and accommodation CVS: S1-S2 clear no murmur rubs or  gallops Chest: clear to auscultation bilaterally, no wheezing rales or rhonchi Abdomen: soft nontender, nondistended, normal bowel sounds Extremities: no cyanosis, clubbing or edema noted bilaterally Neuro: Cranial nerves II-XII intact, no focal neurological deficits   The results of significant diagnostics from this hospitalization (including imaging, microbiology, ancillary and laboratory) are listed below for reference.    LAB RESULTS: Basic Metabolic Panel: Recent Labs  Lab 09/25/17 0250 09/26/17 0318 09/28/17 0412  NA 131* 135 139  K 3.6 3.9 3.5  CL 104 105 109  CO2 20* 20* 21*  GLUCOSE 83 195* 83  BUN <5* <5* 6  CREATININE 0.73 0.67 0.83  CALCIUM 8.2* 8.8* 9.1  MG 1.9  --   --    Liver Function Tests: No results for input(s): AST, ALT, ALKPHOS, BILITOT, PROT, ALBUMIN in the last 168 hours. No results for input(s): LIPASE, AMYLASE in the last 168 hours. No results for input(s): AMMONIA in the last 168 hours. CBC:  Recent Labs  Lab 09/26/17 0318 09/28/17 0412  WBC 3.3* 4.3  HGB 8.1* 8.1*  HCT 24.6* 25.4*  MCV 104.7* 105.8*  PLT 136* 140*   Cardiac Enzymes: No results for input(s): CKTOTAL, CKMB, CKMBINDEX, TROPONINI in the last 168 hours. BNP: Invalid input(s): POCBNP CBG: No results for input(s): GLUCAP in the last 168 hours.  Significant Diagnostic Studies:  Ct Abdomen Pelvis Wo Contrast  Result Date: 09/20/2017 CLINICAL DATA:  Right lower quadrant pain with nausea, vomiting, and diarrhea for 4 days. EXAM: CT ABDOMEN AND PELVIS WITHOUT CONTRAST TECHNIQUE: Multidetector CT imaging of the abdomen and pelvis was performed following the standard protocol without IV contrast. COMPARISON:  CT scan June 28, 2017 FINDINGS: Lower chest: No acute abnormality. Hepatobiliary: The liver is enlarged with a mildly nodular contour. No suspicious mass. Previous cholecystectomy. Pancreas: Unremarkable. No pancreatic ductal dilatation or surrounding inflammatory changes.  Spleen: Stable splenomegaly. Adrenals/Urinary Tract: Adrenal glands are normal. The atrophic left kidney is stable. No renal stones, masses, or hydronephrosis. No perinephric stranding. The right ureter is normal with no stones. The bladder is unremarkable. Stomach/Bowel: The stomach is poorly evaluated due to lack of distention. The wall is mildly prominent, likely due to lack of distention. The small bowel is normal. Contrast is seen in the colon to the level of the rectum. The colon is normal. The appendix is normal, best seen on coronal images. There is some stranding and probably a small amount of fluid in the right pericolic gutter which abuts the appendix. However, this does not appear to arise from the appendix. Vascular/Lymphatic: Minimal atherosclerotic change in the abdominal aorta. No aneurysm. No adenopathy. Reproductive: Uterus and bilateral adnexa are unremarkable. Other: There is some stranding and possibly a small amount of fluid in the right pericolic gutter extending towards midline such as on axial image 57 which is new since the previous study. An underlying cause is not identified. Adjacent structures are normal in appearance. Musculoskeletal: No acute or significant osseous findings. IMPRESSION: 1. There is some increased attenuation in the fat of the right pericolic gutter and probably a tiny amount of fluid extending towards midline which is new since the previous study but is nonspecific. An underlying etiology is not seen. The appendix is adjacent to this finding but normal in appearance and there is no evidence of appendicitis on this study. 2. The liver is enlarged and mildly nodular.  Splenomegaly persists. 3. Mild atherosclerotic change. Aortic Atherosclerosis (ICD10-I70.0). Electronically Signed   By: Dorise Bullion III M.D   On: 09/20/2017 20:33   Dg Chest Port 1 View  Result Date: 09/20/2017 CLINICAL DATA:  Fever. EXAM: PORTABLE CHEST 1 VIEW COMPARISON:  06/30/2017 FINDINGS:  The heart size and mediastinal contours are within normal limits. Both lungs are clear. The visualized skeletal structures are unremarkable. IMPRESSION: No active disease. Electronically Signed   By: Earle Gell M.D.   On: 09/20/2017 16:06    2D ECHO:   Disposition and Follow-up: Discharge Instructions    Diet - low sodium heart healthy   Complete by:  As directed    Discharge instructions   Complete by:  As directed    Take Vancomycin 142m 4 times a day for 7 days, then taper to 1222m3 times a day x 7 days, then 2 times a day x 7 days, then daily for 7 days, then every other day for 2 weeks.   Increase activity slowly   Complete by:  As directed  DISPOSITION: Home   DISCHARGE FOLLOW-UP Follow-up Information    Ladell Pier, MD. Schedule an appointment as soon as possible for a visit in 2 week(s).   Specialty:  Internal Medicine Contact information: [place] [address] [phone]        Yetta Flock, MD. Schedule an appointment as soon as possible for a visit in 2 week(s).   Specialty:  Gastroenterology Why:  please keep your appointment or schedule earlier in 2 weeks, if possible.  Contact information: [address] [place] [address] [phone]        Campbell Riches, MD. Schedule an appointment as soon as possible for a visit in 4 week(s).   Specialty:  Infectious Diseases Contact information: [place] [address] [phone]            Time spent on Discharge: 35 minutes  Signed:   Estill Cotta M.D. [facility] 1Oct 24, 202018, 1:53 PM Pager: [phone]

## 2017-09-28 NOTE — Progress Notes (Addendum)
Faxed [place] letter to [facility]. Unit RN to pick up meds from pharmacy prior to pt dc. Pt will dc home with Vancomycin. Jonnie Finner RN CCM Case Mgmt phone [phone]    Discharge Planning:  NCM spoke to pt and gave permission to speak to husband. [place] that has not cleared the area. She can transport by [employer] home but taxi would not be able to make up hill or assist pt with getting in the home. Husband can pay for taxi. Pt's husband states he will attempt to pick pt up on tomorrow. Will provide pt with MATCH with a $3 copay and utilization once per year. Pt used in 9/18 so Case Mgmt approved override. Pt will need to call [facility] to arrange follow up appt. Attempted to call for follow up and clinic is closed. Pt will need to utilize [facility] for medications. Called and they have some Vancomycin in stock but not full dose. She can pick up some and they will order additional dose.  Jonnie Finner RN CCM Case Mgmt phone [phone]

## 2017-09-28 NOTE — Progress Notes (Signed)
MEDICATION RELATED CONSULT NOTE - FOLLOW UP   Pharmacy Consult for assistance with oral vancomycin taper for discharge today 09/28/17 Indication: prolonged c diff taper  Allergies  Allergen Reactions  . Iohexol Hives, Itching and Swelling  . Morphine And Related Itching    Can tolerate with Benadryl    Patient Measurements: Height: 5\' 1"  (154.9 cm) Weight: 141 lb 3.2 oz (64 kg) IBW/kg (Calculated) : 47.8   Vital Signs: Temp: 98.2 F (36.8 C) (12/10 0536) BP: 110/52 (12/10 0536) Pulse Rate: 91 (12/10 0536) Intake/Output from previous day: 12/09 0701 - 12/10 0700 In: 840 [P.O.:840] Out: -  Intake/Output from this shift: No intake/output data recorded.  Labs: Recent Labs    09/26/17 0318 09/28/17 0412  WBC 3.3* 4.3  HGB 8.1* 8.1*  HCT 24.6* 25.4*  PLT 136* 140*  CREATININE 0.67 0.83   Estimated Creatinine Clearance: 72.6 mL/min (by C-G formula based on SCr of 0.83 mg/dL).   Microbiology: Recent Results (from the past 720 hour(s))  Blood Culture (routine x 2)     Status: None   Collection Time: 09/20/17  3:15 PM  Result Value Ref Range Status   Specimen Description BLOOD LEFT ANTECUBITAL  Final   Special Requests   Final    BOTTLES DRAWN AEROBIC AND ANAEROBIC Blood Culture adequate volume   Culture NO GROWTH 5 DAYS  Final   Report Status 09/25/2017 FINAL  Final  Blood Culture (routine x 2)     Status: None   Collection Time: 09/20/17  3:25 PM  Result Value Ref Range Status   Specimen Description BLOOD LEFT HAND  Final   Special Requests   Final    BOTTLES DRAWN AEROBIC AND ANAEROBIC Blood Culture adequate volume   Culture NO GROWTH 5 DAYS  Final   Report Status 09/25/2017 FINAL  Final  C difficile quick scan w PCR reflex     Status: Abnormal   Collection Time: 09/20/17  3:25 PM  Result Value Ref Range Status   C Diff antigen POSITIVE (A) NEGATIVE Final   C Diff toxin NEGATIVE NEGATIVE Final   C Diff interpretation Results are indeterminate. See PCR  results.  Final  Clostridium Difficile by PCR     Status: Abnormal   Collection Time: 09/20/17  3:25 PM  Result Value Ref Range Status   Toxigenic C Difficile by pcr POSITIVE (A) NEGATIVE Final    Comment: Positive for toxigenic C. difficile with little to no toxin production. Only treat if clinical presentation suggests symptomatic illness.  MRSA PCR Screening     Status: None   Collection Time: 09/22/17 11:01 AM  Result Value Ref Range Status   MRSA by PCR NEGATIVE NEGATIVE Final    Comment:        The GeneXpert MRSA Assay (FDA approved for NASAL specimens only), is one component of a comprehensive MRSA colonization surveillance program. It is not intended to diagnose MRSA infection nor to guide or monitor treatment for MRSA infections.      Assessment/Plan;  Dr. Johnnye Sima consulted pharmacy to assist in arranging outpatient oral vancomycin RX for prolonged c diff taper over 12 weeks at the least expensive cost to patient.  Case Manager had already received override for MATCH with $3 copay.  I called [facility] and discussed this with Garnet Sierras, Pharm D and Janit Bern, Pharm D.  Dr. Johnnye Sima has entered the vancomycin taper orders which I confirmed the dosing with him.  Dr. Tana Coast prescribed these as  ordered by ID, on discharge.  Vancomycin taper will end on 12/24/16. [facility] only has ~ 40 vancomycin capsules to fill partial prescription today. I instructed patient that she will have to return to [facility] later to pick up remaining vancomycin prescription. Patient states she understands.  Vancomycin oral taper prescriptions delivered to [facility] by myself.  Patient to be transported home via taxi, thus Rey, RN will pick up Rx's and give them to Mrs. [facility] prior to discharge.   Nicole Cella, RPh Clinical Pharmacist 8A-4P [phone] 4P-10P [phone] Main Pharmacy [phone] 108-02-2017,2:50 PM

## 2017-09-30 MED FILL — traMADol HCL 50 MG TABS: 50 | 5 days supply | Qty: 20 | Fill #0

## 2017-10-06 ENCOUNTER — Other Ambulatory Visit: Payer: Self-pay | Admitting: Internal Medicine

## 2017-10-06 MED FILL — PROMETHAZINE 25 MG TABLET: 25 | 15 days supply | Qty: 45 | Fill #0

## 2017-10-07 MED FILL — GABAPENTIN 300 MG CAPSULE: 300 | 30 days supply | Qty: 30 | Fill #0

## 2017-10-12 DIAGNOSIS — D649 Anemia, unspecified: Secondary | ICD-10-CM | POA: Insufficient documentation

## 2017-10-12 DIAGNOSIS — I10 Essential (primary) hypertension: Secondary | ICD-10-CM | POA: Insufficient documentation

## 2017-10-12 DIAGNOSIS — K7581 Nonalcoholic steatohepatitis (NASH): Secondary | ICD-10-CM | POA: Insufficient documentation

## 2017-10-12 DIAGNOSIS — G629 Polyneuropathy, unspecified: Secondary | ICD-10-CM | POA: Insufficient documentation

## 2017-10-12 DIAGNOSIS — S82892A Other fracture of left lower leg, initial encounter for closed fracture: Secondary | ICD-10-CM | POA: Insufficient documentation

## 2017-10-12 DIAGNOSIS — Z765 Malingerer [conscious simulation]: Secondary | ICD-10-CM | POA: Insufficient documentation

## 2017-10-12 DIAGNOSIS — T7840XA Allergy, unspecified, initial encounter: Secondary | ICD-10-CM | POA: Insufficient documentation

## 2017-10-12 DIAGNOSIS — D849 Immunodeficiency, unspecified: Secondary | ICD-10-CM | POA: Insufficient documentation

## 2017-10-16 ENCOUNTER — Ambulatory Visit: Payer: Self-pay | Admitting: Cardiovascular Disease

## 2017-10-16 ENCOUNTER — Ambulatory Visit (INDEPENDENT_AMBULATORY_CARE_PROVIDER_SITE_OTHER): Payer: Self-pay | Admitting: Gastroenterology

## 2017-10-16 ENCOUNTER — Telehealth: Payer: Self-pay | Admitting: Gastroenterology

## 2017-10-16 ENCOUNTER — Other Ambulatory Visit: Payer: Self-pay | Admitting: Internal Medicine

## 2017-10-16 ENCOUNTER — Other Ambulatory Visit: Payer: Self-pay

## 2017-10-16 ENCOUNTER — Other Ambulatory Visit (INDEPENDENT_AMBULATORY_CARE_PROVIDER_SITE_OTHER): Payer: Self-pay

## 2017-10-16 ENCOUNTER — Encounter: Payer: Self-pay | Admitting: Gastroenterology

## 2017-10-16 VITALS — BP 120/68 | HR 97 | Ht 61.0 in | Wt 131.0 lb

## 2017-10-16 DIAGNOSIS — A0472 Enterocolitis due to Clostridium difficile, not specified as recurrent: Secondary | ICD-10-CM

## 2017-10-16 DIAGNOSIS — K7460 Unspecified cirrhosis of liver: Secondary | ICD-10-CM

## 2017-10-16 DIAGNOSIS — R1011 Right upper quadrant pain: Secondary | ICD-10-CM

## 2017-10-16 LAB — COMPREHENSIVE METABOLIC PANEL
ALBUMIN: 3.9 g/dL (ref 3.5–5.2)
ALK PHOS: 163 U/L — AB (ref 39–117)
ALT: 19 U/L (ref 0–35)
AST: 51 U/L — ABNORMAL HIGH (ref 0–37)
BILIRUBIN TOTAL: 4.4 mg/dL — AB (ref 0.2–1.2)
BUN: 7 mg/dL (ref 6–23)
CO2: 25 mEq/L (ref 19–32)
Calcium: 9.5 mg/dL (ref 8.4–10.5)
Chloride: 98 mEq/L (ref 96–112)
Creatinine, Ser: 0.8 mg/dL (ref 0.40–1.20)
GFR: 81.72 mL/min (ref >60.00)
Glucose, Bld: 101 mg/dL — ABNORMAL HIGH (ref 70–99)
POTASSIUM: 4.4 meq/L (ref 3.5–5.1)
Sodium: 132 mEq/L — ABNORMAL LOW (ref 135–145)
TOTAL PROTEIN: 8.6 g/dL — AB (ref 6.0–8.3)

## 2017-10-16 LAB — CBC WITH DIFFERENTIAL/PLATELET
BASOS ABS: 0 10*3/uL (ref 0.0–0.1)
Basophils Relative: 0.3 % (ref 0.0–3.0)
EOS PCT: 1.4 % (ref 0.0–5.0)
Eosinophils Absolute: 0.1 10*3/uL (ref 0.0–0.7)
HCT: 28 % — ABNORMAL LOW (ref 36.0–46.0)
HEMOGLOBIN: 9.3 g/dL — AB (ref 12.0–15.0)
Lymphocytes Relative: 17.5 % (ref 12.0–46.0)
Lymphs Abs: 1.1 10*3/uL (ref 0.7–4.0)
MCHC: 33.2 g/dL (ref 30.0–36.0)
MONO ABS: 0.4 10*3/uL (ref 0.1–1.0)
MONOS PCT: 6.2 % (ref 3.0–12.0)
Neutro Abs: 4.9 10*3/uL (ref 1.4–7.7)
Neutrophils Relative %: 74.6 % (ref 43.0–77.0)
Platelets: 137 10*3/uL — ABNORMAL LOW (ref 150.0–400.0)
RBC: 2.54 Mil/uL — AB (ref 3.87–5.11)
RDW: 20.4 % — ABNORMAL HIGH (ref 11.5–15.5)
WBC: 6.6 10*3/uL (ref 4.0–10.5)

## 2017-10-16 MED ORDER — DICYCLOMINE HCL 10 MG PO CAPS: 10.0000 mg | ORAL_CAPSULE | Freq: Three times a day (TID) | ORAL | 1 refills | Status: DC | PRN

## 2017-10-16 MED ORDER — VANCOMYCIN HCL 125 MG PO CAPS: 125.0000 mg | ORAL_CAPSULE | Freq: Four times a day (QID) | ORAL | 0 refills | Status: DC

## 2017-10-16 MED FILL — DICYCLOMINE 10 MG CAPSULE: 10 | 20 days supply | Qty: 60 | Fill #0
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
## 2017-10-16 NOTE — Telephone Encounter (Signed)
Pt said she had labwork done at [facility] and wants to know if she still needs to come in for labs again?

## 2017-10-16 NOTE — Telephone Encounter (Signed)
You have a banding slot open at 4:00 today, do you want me to put her in that slot and come in at 3:45? Also if so, do you want some labs done prior?

## 2017-10-16 NOTE — Telephone Encounter (Signed)
Patient requesting higher dose of gabapentin due to hospitalization. Likely needs to be seen first. Will forward to PCP, OV scheduled 10/21/17 with Walk In and 11/02/17 with PCP
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
## 2017-10-16 NOTE — Telephone Encounter (Signed)
Yes if she can get CBC and CMET done prior to the visit, ordered STAT so they return in time for her visit, I will see her in the banding slot. Thanks

## 2017-10-16 NOTE — Patient Instructions (Addendum)
If you are age 47 or older, your body mass index should be between 23-30. Your Body mass index is 24.75 kg/m. If this is out of the aforementioned range listed, please consider follow up with your Primary Care Provider.  If you are age 83 or younger, your body mass index should be between 19-25. Your Body mass index is 24.75 kg/m. If this is out of the aformentioned range listed, please consider follow up with your Primary Care Provider.   We have sent the following medications to your pharmacy for you to pick up at your convenience: Vancomycin 125 mg: Take 1 tablet 4 times a day for 10 days  Bentyl 10mg : Take every 8 hours as needed   Thank you for entrusting me with your care, Dr. [place] Cellar

## 2017-10-16 NOTE — Telephone Encounter (Signed)
Patient advised to come in for labs, ordered STAT. She is aware that appointment is today arrive at 3:45 for a 4:00 appointment. She does have an appointment at cardiologist today at 3:00 and will try to be here as soon as that appointment ends.

## 2017-10-19 DIAGNOSIS — R112 Nausea with vomiting, unspecified: Secondary | ICD-10-CM

## 2017-10-19 DIAGNOSIS — R10813 Right lower quadrant abdominal tenderness: Secondary | ICD-10-CM

## 2017-10-21 ENCOUNTER — Ambulatory Visit: Payer: Self-pay | Attending: Internal Medicine | Admitting: Physician Assistant

## 2017-10-21 ENCOUNTER — Other Ambulatory Visit: Payer: Self-pay

## 2017-10-21 VITALS — BP 126/79 | HR 92 | Temp 98.7°F | Resp 16 | Ht 61.0 in | Wt 130.2 lb

## 2017-10-21 DIAGNOSIS — R000 Tachycardia, unspecified: Secondary | ICD-10-CM | POA: Insufficient documentation

## 2017-10-21 DIAGNOSIS — A419 Sepsis, unspecified organism: Secondary | ICD-10-CM | POA: Insufficient documentation

## 2017-10-21 DIAGNOSIS — A0471 Enterocolitis due to Clostridium difficile, recurrent: Secondary | ICD-10-CM | POA: Insufficient documentation

## 2017-10-21 DIAGNOSIS — G6289 Other specified polyneuropathies: Secondary | ICD-10-CM

## 2017-10-21 DIAGNOSIS — D638 Anemia in other chronic diseases classified elsewhere: Secondary | ICD-10-CM | POA: Insufficient documentation

## 2017-10-21 DIAGNOSIS — G629 Polyneuropathy, unspecified: Secondary | ICD-10-CM | POA: Insufficient documentation

## 2017-10-21 DIAGNOSIS — K219 Gastro-esophageal reflux disease without esophagitis: Secondary | ICD-10-CM | POA: Insufficient documentation

## 2017-10-21 DIAGNOSIS — I959 Hypotension, unspecified: Secondary | ICD-10-CM | POA: Insufficient documentation

## 2017-10-21 DIAGNOSIS — E876 Hypokalemia: Secondary | ICD-10-CM | POA: Insufficient documentation

## 2017-10-21 DIAGNOSIS — I10 Essential (primary) hypertension: Secondary | ICD-10-CM | POA: Insufficient documentation

## 2017-10-21 DIAGNOSIS — D849 Immunodeficiency, unspecified: Secondary | ICD-10-CM | POA: Insufficient documentation

## 2017-10-21 DIAGNOSIS — K7030 Alcoholic cirrhosis of liver without ascites: Secondary | ICD-10-CM | POA: Insufficient documentation

## 2017-10-21 DIAGNOSIS — D61818 Other pancytopenia: Secondary | ICD-10-CM | POA: Insufficient documentation

## 2017-10-21 DIAGNOSIS — K529 Noninfective gastroenteritis and colitis, unspecified: Secondary | ICD-10-CM

## 2017-10-21 DIAGNOSIS — Z09 Encounter for follow-up examination after completed treatment for conditions other than malignant neoplasm: Secondary | ICD-10-CM

## 2017-10-21 DIAGNOSIS — Z79899 Other long term (current) drug therapy: Secondary | ICD-10-CM | POA: Insufficient documentation

## 2017-10-21 DIAGNOSIS — M25571 Pain in right ankle and joints of right foot: Secondary | ICD-10-CM | POA: Insufficient documentation

## 2017-10-21 DIAGNOSIS — R51 Headache: Secondary | ICD-10-CM | POA: Insufficient documentation

## 2017-10-21 DIAGNOSIS — F419 Anxiety disorder, unspecified: Secondary | ICD-10-CM | POA: Insufficient documentation

## 2017-10-21 MED ORDER — GABAPENTIN 300 MG PO CAPS: 300.0000 mg | ORAL_CAPSULE | Freq: Three times a day (TID) | ORAL | 1 refills | Status: DC

## 2017-10-21 MED FILL — GABAPENTIN 300 MG CAPSULE: 300 | 30 days supply | Qty: 90 | Fill #0
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
## 2017-10-21 NOTE — Progress Notes (Signed)
F/u hospitalization RF gabapentin x 1 day.

## 2017-10-22 ENCOUNTER — Encounter: Payer: Self-pay | Admitting: Internal Medicine

## 2017-10-22 ENCOUNTER — Ambulatory Visit (INDEPENDENT_AMBULATORY_CARE_PROVIDER_SITE_OTHER): Payer: Self-pay | Admitting: Internal Medicine

## 2017-10-22 VITALS — BP 123/75 | HR 90 | Temp 98.3°F | Ht 61.0 in | Wt 131.0 lb

## 2017-10-22 DIAGNOSIS — K7460 Unspecified cirrhosis of liver: Secondary | ICD-10-CM

## 2017-10-22 DIAGNOSIS — A0471 Enterocolitis due to Clostridium difficile, recurrent: Secondary | ICD-10-CM

## 2017-10-22 DIAGNOSIS — A498 Other bacterial infections of unspecified site: Secondary | ICD-10-CM

## 2017-10-22 DIAGNOSIS — K7581 Nonalcoholic steatohepatitis (NASH): Secondary | ICD-10-CM

## 2017-10-22 NOTE — Progress Notes (Signed)
RFV: recurrent cdifficile  Patient ID: Jamie Allen, female   DOB: September 30, 1971, 47 y.o.   MRN: [id number]  HPI  47yo F with NASH cirrhosis, recurrent refractory cdiff who was hospitalized for cdifficile in early December, placed on a taper, however started to have worsening diarrhea while on bid dosing of oral vanco. Now back up to QID dosing per dr Havery Moros.  Here for eval for FMT. Please seen note from ID consultation for recent hx of cdifficile. Initially had cdifficile in 2015, however this past year, has had associated hospitalization and protracted course since September. Has received oral vanco taper in the past. She also follows with dr Havery Moros for her NASH cirrhosis.   She is on QID dosing of vancomycin presently, had 2 soft-> watery BM yesterday   Outpatient Encounter Medications as of 10/22/2017  Medication Sig  . dicyclomine (BENTYL) 10 MG capsule Take 1 capsule (10 mg total) by mouth 3 (three) times daily before meals.  . dicyclomine (BENTYL) 10 MG capsule Take 1 capsule (10 mg total) by mouth every 8 (eight) hours as needed for spasms.  . folic acid (FOLVITE) 1 MG tablet Take 1 tablet (1 mg total) by mouth daily.  Marland Kitchen gabapentin (NEURONTIN) 300 MG capsule Take 1 capsule (300 mg total) by mouth 3 (three) times daily.  . hydrOXYzine (ATARAX/VISTARIL) 10 MG tablet Take 1 tablet (10 mg total) by mouth 2 (two) times daily.  . nicotine polacrilex (NICORETTE) 4 MG gum Take 4 mg by mouth 4 (four) times daily as needed for smoking cessation.  . pregabalin (LYRICA) 50 MG capsule Take 1 capsule (50 mg total) by mouth 3 (three) times daily.  . promethazine (PHENERGAN) 25 MG tablet Take 1 tablet (25 mg total) by mouth every 8 (eight) hours as needed for nausea or vomiting.  . Thiamine HCl (VITAMIN B-1 PO) Take 1 tablet by mouth daily.  . vancomycin (VANCOCIN) 125 MG capsule Take 1 capsule (125 mg total) by mouth 4 (four) times daily.  . vancomycin (VANCOCIN) 50 mg/mL oral solution  Take 2.5 mLs (125 mg total) by mouth 4 (four) times daily. For 14 days  Please prescribe Oral vancomycin caps form  . vancomycin (VANCOCIN) 50 mg/mL oral solution Take 2.5 mLs (125 mg total) by mouth 2 (two) times daily. X 7 days, start 10/12/17  . vancomycin (VANCOCIN) 50 mg/mL oral solution Take 2.5 mLs (125 mg total) by mouth daily. For 7 days. Start 10/20/2017  Please prescribe Oral vancomycin caps form  . [START ON 10/27/2017] vancomycin (VANCOCIN) 50 mg/mL oral solution Take 2.5 mLs (125 mg total) by mouth every other day. For 28 days, starting 10/27/2017  . feeding supplement (BOOST / RESOURCE BREEZE) LIQD Take 1 Container by mouth 3 (three) times daily between meals. (Patient not taking: Reported on 10/21/2017)  . levothyroxine (SYNTHROID, LEVOTHROID) 75 MCG tablet Take 1 tablet (75 mcg total) by mouth daily before breakfast.  . traMADol (ULTRAM) 50 MG tablet Take 1 tablet (50 mg total) by mouth every 6 (six) hours as needed. (Patient not taking: Reported on 10/21/2017)   No facility-administered encounter medications on file as of 10/22/2017.      Patient Active Problem List   Diagnosis Date Noted  . Nausea   . RLQ abdominal tenderness   . Peripheral neuropathy   . Nonalcoholic steatohepatitis ([facility])   . Immune deficiency disorder ([name])   . Hypertension   . Drug-seeking behavior   . Ankle fracture, left   . Anemia   .  Allergy   . Pancytopenia ([place])   . Alcoholic cirrhosis of liver ([place]) 09/22/2017  . Sepsis associated hypotension ([place]) 09/20/2017  . Fluid excess 06/30/2017  . C. difficile enteritis 06/30/2017  . Hypokalemia 06/29/2017  . Portal hypertensive gastropathy ([place]) 05/28/2017  . Anemia of chronic disease 05/28/2017  . Alcoholic cirrhosis of liver without ascites ([place])   . Hypotension 04/29/2017  . Abnormal CT scan, colon   . Abdominal pain 01/11/2017  . Hepatosplenomegaly 01/03/2017  . Obesity (BMI 30-39.9) 01/03/2017  . Hypomagnesemia 01/03/2017  . GERD  (gastroesophageal reflux disease)   . Anxiety   . Chronic cholecystitis with calculus s/p lap cholecystectomy 12/25/2016 12/24/2016  . Postmenopausal syndrome 11/24/2016  . Hypothyroidism 12/06/2015  . Foot fracture 10/06/2015  . Bimalleolar fracture of right ankle 10/06/2015  . Abnormality of gait 10/02/2015  . Paresthesia 10/02/2015  . Intractable pain 09/23/2015  . Ankle fracture 09/23/2015  . Vitamin D deficiency 08/28/2015  . Neuropathic pain, leg, bilateral 08/27/2015  . IBS (irritable bowel syndrome) 08/27/2015  . Other and unspecified ovarian cyst 05/30/2014  . Anxiety and depression 04/19/2014  . C. difficile diarrhea 02/02/2014  . Post-traumatic stress 01/03/2014  . Hemorrhage 01/03/2014  . Colitis 01/03/2014  . Colitis presumed infectious 12/15/2013  . Antral gastritis 10/20/2013  . Nausea vomiting and diarrhea 07/21/2013     Health Maintenance Due  Topic Date Due  . TETANUS/TDAP  10/15/1989  . INFLUENZA VACCINE  05/20/2017    Social History   Tobacco Use  . Smoking status: Current Some Day Smoker    Packs/day: 0.25    Years: 20.00    Pack years: 5.00    Types: Cigarettes  . Smokeless tobacco: Never Used  . Tobacco comment: 4 cigs a day   Substance Use Topics  . Alcohol use: No    Alcohol/week: 0.0 oz    Comment: no etoh now- used to be 21 glasses wine a week, did 1 beer a day but not doing that now either   . Drug use: No    Comment: Per patient - has not used marijuana since her 68s  family history includes Aneurysm in her paternal grandfather; Breast cancer in her maternal grandmother; Heart attack in her maternal grandfather; Hyperlipidemia in her mother; Hypertension in her mother; Hypothyroidism in her sister; Stomach cancer in her father; Suicidality in her father.  Review of Systems Review of Systems  Constitutional: Negative for fever, chills, diaphoresis, activity change, appetite change, fatigue and unexpected weight change.  HENT: Negative for  congestion, sore throat, rhinorrhea, sneezing, trouble swallowing and sinus pressure.  Eyes: Negative for photophobia and visual disturbance.  Respiratory: Negative for cough, chest tightness, shortness of breath, wheezing and stridor.  Cardiovascular: Negative for chest pain, palpitations and leg swelling.  Gastrointestinal: +watery diarrhea associated with abdominal cramping. Negative for nausea, vomiting, constipation, blood in stool, abdominal distention and anal bleeding.  Genitourinary: Negative for dysuria, hematuria, flank pain and difficulty urinating.  Musculoskeletal: Negative for myalgias, back pain, joint swelling, arthralgias and gait problem.  Skin: Negative for color change, pallor, rash and wound.  Neurological: Negative for dizziness, tremors, weakness and light-headedness.  Hematological: Negative for adenopathy. Does not bruise/bleed easily.  Psychiatric/Behavioral: Negative for behavioral problems, confusion, sleep disturbance, dysphoric mood, decreased concentration and agitation.    Physical Exam   BP 123/75   Pulse 90   Temp 98.3 F (36.8 C) (Oral)   Ht 5\' 1"  (1.549 m)   Wt 131 lb (59.4 kg)   LMP  06/01/2014   BMI 24.75 kg/m   Physical Exam  Constitutional:  oriented to person, place, and time. appears well-developed and well-nourished. No distress.  HENT: [place]/AT, PERRLA, no scleral icterus Mouth/Throat: Oropharynx is clear and moist. No oropharyngeal exudate.  Cardiovascular: Normal rate, regular rhythm and normal heart sounds. Exam reveals no gallop and no friction rub.  No murmur heard.  Pulmonary/Chest: Effort normal and breath sounds normal. No respiratory distress.  has no wheezes.  Neck = supple, no nuchal rigidity Abdominal: Soft. Bowel sounds are normal.  exhibits no distension. There is no tenderness.  Lymphadenopathy: no cervical adenopathy. No axillary adenopathy Neurological: alert and oriented to person, place, and time.  Skin: Skin is warm and  dry. No rash noted. No erythema.  Psychiatric: a normal mood and affect.  behavior is normal.    Lab Results  Component Value Date   HEPBSAB NEG 01/28/2017   Lab Results  Component Value Date   LABRPR Non Reactive 10/02/2015    CBC Lab Results  Component Value Date   WBC 6.6 10/16/2017   RBC 2.54 (L) 10/16/2017   HGB 9.3 (L) 10/16/2017   HCT 28.0 (L) 10/16/2017   PLT 137.0 (L) 10/16/2017   MCV 110.1 Repeated and verified X2. (H) 10/16/2017   MCH 33.8 10Mar 24, 202018   MCHC 33.2 10/16/2017   RDW 20.4 (H) 10/16/2017   LYMPHSABS 1.1 10/16/2017   MONOABS 0.4 10/16/2017   EOSABS 0.1 10/16/2017    BMET Lab Results  Component Value Date   NA 132 (L) 10/16/2017   K 4.4 10/16/2017   CL 98 10/16/2017   CO2 25 10/16/2017   GLUCOSE 101 (H) 10/16/2017   BUN 7 10/16/2017   CREATININE 0.80 10/16/2017   CALCIUM 9.5 10/16/2017   GFRNONAA >60 10Mar 24, 202018   GFRAA >60 10Mar 24, 202018      Assessment and Plan Refractory clostridium difficile colitis = I suspect that she would be a good candidate for FMT. Would like to apply for [employer] scholarship to get stool for FMT donated.   She only has 6 days left of oral vanco. Trying to get access to more vanco til FMT Try to do FMT in 2-3 wk Will reach out to armbruster to schedule.  Discussed pros/cons of FMT, needing IC since it is not FDA approved.   Spent 30 min with patient in coordination of care for FMT/CDI infection

## 2017-10-23 ENCOUNTER — Encounter: Payer: Self-pay | Admitting: Neurology

## 2017-10-23 ENCOUNTER — Other Ambulatory Visit: Payer: Self-pay

## 2017-10-23 ENCOUNTER — Ambulatory Visit: Payer: Self-pay | Attending: Internal Medicine

## 2017-10-23 ENCOUNTER — Ambulatory Visit (INDEPENDENT_AMBULATORY_CARE_PROVIDER_SITE_OTHER): Payer: Self-pay | Admitting: Neurology

## 2017-10-23 VITALS — BP 100/70 | HR 92 | Ht 61.0 in | Wt 130.1 lb

## 2017-10-23 DIAGNOSIS — A0472 Enterocolitis due to Clostridium difficile, not specified as recurrent: Secondary | ICD-10-CM

## 2017-10-23 DIAGNOSIS — G621 Alcoholic polyneuropathy: Secondary | ICD-10-CM

## 2017-10-23 DIAGNOSIS — E5111 Dry beriberi: Secondary | ICD-10-CM

## 2017-10-23 DIAGNOSIS — Z720 Tobacco use: Secondary | ICD-10-CM

## 2017-10-23 MED ORDER — PEG-KCL-NACL-NASULF-NA ASC-C 140 G PO SOLR: 1.0000 | ORAL | 0 refills | Status: DC

## 2017-10-23 NOTE — Patient Instructions (Signed)
Continue vitamin B1 100mg  daily  Return to clinic in 6 month

## 2017-10-23 NOTE — Progress Notes (Signed)
Follow-up Visit   Date: 10/23/17    Jamie Allen MRN: [id number] DOB: 1970-12-18   Interim History: Jamie Allen is a 47 y.o. -handed Caucasian female with cirrhosis/fatty liver, hypertension, current tobacco use returning to the clinic for follow-up of neuropathy.  The patient was accompanied to the clinic by friend who also provides collateral information.    History of present illness: She had colitis in 2013 with frequent hospitalizations and eventually found to have C. Difficile.  Since this time, she started having burning and stinging pain of the legs, from the knees down.  She also complains of stingling of the hands.  She has suffered three falls this year and in the past, fractured both her ankles.  She used a walker for a year and now does not use any gait assistance.   She takes Lyrica 100mg  TID since 2017 which ease some of the pain.  She used to be on gabapentin, which may have helped more.  She also complains of cramps in the lower legs.  She has been extensively evaluated by Dr. Krista Blue for these same complaints in 2016.  Work-up included MRI entire neuroaxis, NCS/EMG, CSF analysis, serology testing.  Her NCS/EMG showed mild axonal neuropathy.  MRI of the neuroaxis and CSF was unremarkable. Labs show folate deficiency, and she denies being compliant with her supplements. Vitamin B12 is normal.   She drinks 3 beers about 2-3 nights per week.  She started drinking wine in 2008 on the weekends, usually 3 glasses.   UPDATE 10/23/2017:  She is here for follow-up visit.  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  She was also found to have thiamine deficiency and takes 100mg .  Insurance did not approve Lyrica, and now she takes gabapentin 300mg  TID, which provides adequate relief.  She had not had any interval falls.  She has stopped drinking alcohol since she was last here.  She is having a lot of GI issues with repeated C.difficule  colitis being followed by gastroenterology.  Medications:  Current Outpatient Medications on File Prior to Visit  Medication Sig Dispense Refill  . dicyclomine (BENTYL) 10 MG capsule Take 1 capsule (10 mg total) by mouth 3 (three) times daily before meals. 90 capsule 3  . feeding supplement (BOOST / RESOURCE BREEZE) LIQD Take 1 Container by mouth 3 (three) times daily between meals. 90 Container 0  . folic acid (FOLVITE) 1 MG tablet Take 1 tablet (1 mg total) by mouth daily. 30 tablet 0  . gabapentin (NEURONTIN) 300 MG capsule Take 1 capsule (300 mg total) by mouth 3 (three) times daily. 90 capsule 1  . hydrOXYzine (ATARAX/VISTARIL) 10 MG tablet Take 1 tablet (10 mg total) by mouth 2 (two) times daily. 60 tablet 3  . levothyroxine (SYNTHROID, LEVOTHROID) 75 MCG tablet Take 1 tablet (75 mcg total) by mouth daily before breakfast. 30 tablet 2  . nicotine polacrilex (NICORETTE) 4 MG gum Take 4 mg by mouth 4 (four) times daily as needed for smoking cessation.    . pregabalin (LYRICA) 50 MG capsule Take 1 capsule (50 mg total) by mouth 3 (three) times daily. 540 capsule 1  . promethazine (PHENERGAN) 25 MG tablet Take 1 tablet (25 mg total) by mouth every 8 (eight) hours as needed for nausea or vomiting. 30 tablet 0  . Thiamine HCl (VITAMIN B-1 PO) Take 1 tablet by mouth daily.    . traMADol (ULTRAM) 50 MG tablet Take 1 tablet (50  mg total) by mouth every 6 (six) hours as needed. 20 tablet 0  . vancomycin (VANCOCIN) 125 MG capsule Take 1 capsule (125 mg total) by mouth 4 (four) times daily. 40 capsule 0  . vancomycin (VANCOCIN) 50 mg/mL oral solution Take 2.5 mLs (125 mg total) by mouth 4 (four) times daily. For 14 days  Please prescribe Oral vancomycin caps form 50 mL 0  . [DISCONTINUED] Calcium Citrate 200 MG TABS Take 2 tablets (400 mg total) by mouth daily. 30 tablet 11  . [DISCONTINUED] QUEtiapine (SEROQUEL) 50 MG tablet Take 6 tablets (300 mg total) by mouth at bedtime. For mood control (Patient  not taking: Reported on 02/05/2015)     No current facility-administered medications on file prior to visit.     Allergies:  Allergies  Allergen Reactions  . Iohexol Hives, Itching and Swelling  . Morphine And Related Itching    Can tolerate with Benadryl    Review of Systems:  CONSTITUTIONAL: No fevers, chills, night sweats, or weight loss.  EYES: No visual changes or eye pain ENT: No hearing changes.  No history of nose bleeds.   RESPIRATORY: No cough, wheezing and shortness of breath.   CARDIOVASCULAR: Negative for chest pain, and palpitations.   GI: +for abdominal discomfort, no blood in stools or black stools.  No recent change in bowel habits.   GU:  No history of incontinence.   MUSCLOSKELETAL: No history of joint pain or swelling.  No myalgias.   SKIN: Negative for lesions, rash, and itching.   ENDOCRINE: Negative for cold or heat intolerance, polydipsia or goiter.   PSYCH:  No depression or anxiety symptoms.   NEURO: As Above.   Vital Signs:  BP 100/70   Pulse 92   Ht 5\' 1"  (1.549 m)   Wt 130 lb 2 oz (59 kg)   LMP 06/01/2014   SpO2 97%   BMI 24.59 kg/m    General: pale appearing, comfortable Neck: No carotid bruit CV: RRR, systolic murmur Ext: No edema  Neurological Exam: MENTAL STATUS including orientation to time, place, person, recent and remote memory, attention span and concentration, language, and fund of knowledge is normal.  Speech is not dysarthric.  CRANIAL NERVES: No visual field defects.  Pupils equal round and reactive to light.  Normal conjugate, extra-ocular eye movements in all directions of gaze.  No ptosis.  Face is symmetric. Palate elevates symmetrically.  Tongue is midline, very poor dentition.  MOTOR:  No atrophy, fasciculations or abnormal movements.  No pronator drift.  Tone is normal.    Right Upper Extremity:    Left Upper Extremity:    Deltoid  5/5   Deltoid  5/5   Biceps  5/5   Biceps  5/5   Triceps  5/5   Triceps  5/5   Wrist  extensors  5/5   Wrist extensors  5/5   Wrist flexors  5/5   Wrist flexors  5/5   Finger extensors  5/5   Finger extensors  5/5   Finger flexors  5/5   Finger flexors  5/5   Dorsal interossei  5/5   Dorsal interossei  5/5   Abductor pollicis  5/5   Abductor pollicis  5/5   Tone (Ashworth scale)  0  Tone (Ashworth scale)  0   Right Lower Extremity:    Left Lower Extremity:    Hip flexors  5/5   Hip flexors  5/5   Hip extensors  5/5   Hip extensors  5/5   Knee flexors  5/5   Knee flexors  5/5   Knee extensors  5/5   Knee extensors  5/5   Dorsiflexors  5/5   Dorsiflexors  5/5   Plantarflexors  5/5   Plantarflexors  5/5   Toe extensors  5-/5   Toe extensors  5-/5   Toe flexors  4+/5   Toe flexors  4+/5   Tone (Ashworth scale)  0  Tone (Ashworth scale)  0   MSRs:  Right                                                                 Left brachioradialis 2+  brachioradialis 2+  biceps 2+  biceps 2+  triceps 2+  triceps 2+  patellar 2+  patellar 2+  ankle jerk 0  ankle jerk 0   SENSORY: Gradient pattern of sensory loss distal to wrists and knees bilaterally to temperature and vibration.  Rhomberg sign is positive.  COORDINATION/GAIT:  Normal finger-to- nose-finger.  Intact rapid alternating movements bilaterally.  Gait is mildly wide-based, unassisted, stable.  She is unsteady with tandem gait.  She is able to stand on heels and toes.  Data: NCS/EMG of the left side 09/15/2017:  The electrophysiologic findings are most consistent with an active on chronic sensorimotor axonal polyneuropathy affecting the left arm and leg and conforming to a length dependent pattern.  Overall, these findings are moderate in degree electrically.  Incidentally, there is a left accessory peroneal nerve, a normal variant.  NCS/EMG of the left arm and bilateral legs 10/03/2015: This is a mild abnormal study. 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 There is no electrodiagnostic evidence  of demyelinating polyradiculoneuropathy, there is no evidence of right cervical, or right lumbosacral radiculopathies to explain the difficulties  MRI brain wwo contrast 09/26/2015: 1. No acute intracranial process. 2. Minimal patchy T2/FLAIR hyperintensity within the periventricular and deep white matter both cerebral hemispheres. Finding is nonspecific, but most likely related to very mild chronic small vessel ischemic changes. This would not be characteristic of underlying demyelinating disease.  MRI cervical spine wwo contrast 09/26/2015: 1. Normal MRI appearance of the cervical spinal cord without evidence for cord compression or severe canal stenosis. 2. Posterior disc protrusion at C5-6 with associated mild canal narrowing, mild cord flattening, and mild bilateral foraminal stenosis. No associated cord signal changes. 3. Mild disc bulge at C6-7 without significant stenosis.  MRI lumbar spine wo contrast 10/02/2015:  Unremarkable MRI lumbar spine (without). No spinal stenosis or foraminal narrowing. No change from MRI on 08/17/15.   MRI thoracic spine wo contrast 10/05/2015:This MRI of the thoracic spine shows the following: 1. Minimal scoliosis of the upper thoracic spine convex to the right. 2. The spinal cord appears normal. 3. There are no significant degenerative changes.  Labs 12/2016:  Ferritin 347, folate 3.6*, vitamin B12 904, copper, ceruloplasmin is normal  CSF 10/03/2015:  Normal protein 26 and glucose 44, VDRL neg,   Labs 08/21/2017:  Folate 9.5, vitamin B1 <6, MMA 123  IMPRESSION/PLAN: 1.  Sensorimotor axonal polyneuropathy conforming to a stocking-glove distribution, confirmed by EMG.    - No findings to suggest demyelinating polyradiculopathy on EMG which was reviewed with patient, but there has been progression of neuropathy consistent  with her clinical exam, most likely due to malnutrition and alcohol  - Praised her for alcohol cessation and encouraged to  abstain from it  - Encouraged healthy diet and lifestyle changes  - Recommend physical therapy for balance training, after her GI work-up as been completed  2.  Thiamine deficiency contributing to neuropathy  - Continue vitamin B1 100mg  daily  3.  Tobacco abuse, trying to quit.  She is currently smoking 4 cigarettes daily.  Patient was informed of the dangers of tobacco abuse including stroke, cancer, and MI, as well as benefits of tobacco cessation.  She is trying to cut back further.  Approximately 4 mins were spent counseling patient cessation techniques. We discussed various methods to help quit smoking, including deciding on a date to quit, joining a support group, pharmacological agents- nicotine gum/patch/lozenges, chantix.  I will reassess her progress at the next follow-up visit.  Return to clinic in 6 months   Thank you for allowing me to participate in patient's care.  If I can answer any additional questions, I would be pleased to do so.    Sincerely,    Mickey Esguerra K. Posey Pronto, DO

## 2017-10-27 ENCOUNTER — Ambulatory Visit (HOSPITAL_COMMUNITY): Payer: Self-pay

## 2017-11-01 ENCOUNTER — Other Ambulatory Visit: Payer: Self-pay | Admitting: Internal Medicine

## 2017-11-02 ENCOUNTER — Ambulatory Visit: Payer: Self-pay | Attending: Internal Medicine | Admitting: Internal Medicine

## 2017-11-02 ENCOUNTER — Other Ambulatory Visit: Payer: Self-pay

## 2017-11-02 ENCOUNTER — Telehealth: Payer: Self-pay

## 2017-11-02 ENCOUNTER — Encounter: Payer: Self-pay | Admitting: Internal Medicine

## 2017-11-02 VITALS — BP 115/77 | HR 87 | Temp 98.5°F | Resp 16 | Wt 133.2 lb

## 2017-11-02 DIAGNOSIS — Z6839 Body mass index (BMI) 39.0-39.9, adult: Secondary | ICD-10-CM | POA: Insufficient documentation

## 2017-11-02 DIAGNOSIS — E638 Other specified nutritional deficiencies: Secondary | ICD-10-CM | POA: Insufficient documentation

## 2017-11-02 DIAGNOSIS — G5793 Unspecified mononeuropathy of bilateral lower limbs: Secondary | ICD-10-CM

## 2017-11-02 DIAGNOSIS — Z79899 Other long term (current) drug therapy: Secondary | ICD-10-CM | POA: Insufficient documentation

## 2017-11-02 DIAGNOSIS — D649 Anemia, unspecified: Secondary | ICD-10-CM | POA: Insufficient documentation

## 2017-11-02 DIAGNOSIS — A0472 Enterocolitis due to Clostridium difficile, not specified as recurrent: Secondary | ICD-10-CM

## 2017-11-02 DIAGNOSIS — E039 Hypothyroidism, unspecified: Secondary | ICD-10-CM | POA: Insufficient documentation

## 2017-11-02 DIAGNOSIS — K7030 Alcoholic cirrhosis of liver without ascites: Secondary | ICD-10-CM | POA: Insufficient documentation

## 2017-11-02 DIAGNOSIS — G629 Polyneuropathy, unspecified: Secondary | ICD-10-CM | POA: Insufficient documentation

## 2017-11-02 DIAGNOSIS — A0471 Enterocolitis due to Clostridium difficile, recurrent: Secondary | ICD-10-CM | POA: Insufficient documentation

## 2017-11-02 DIAGNOSIS — F17210 Nicotine dependence, cigarettes, uncomplicated: Secondary | ICD-10-CM | POA: Insufficient documentation

## 2017-11-02 DIAGNOSIS — D849 Immunodeficiency, unspecified: Secondary | ICD-10-CM | POA: Insufficient documentation

## 2017-11-02 DIAGNOSIS — K589 Irritable bowel syndrome without diarrhea: Secondary | ICD-10-CM | POA: Insufficient documentation

## 2017-11-02 DIAGNOSIS — I10 Essential (primary) hypertension: Secondary | ICD-10-CM | POA: Insufficient documentation

## 2017-11-02 DIAGNOSIS — E669 Obesity, unspecified: Secondary | ICD-10-CM | POA: Insufficient documentation

## 2017-11-02 DIAGNOSIS — K219 Gastro-esophageal reflux disease without esophagitis: Secondary | ICD-10-CM | POA: Insufficient documentation

## 2017-11-02 DIAGNOSIS — K7581 Nonalcoholic steatohepatitis (NASH): Secondary | ICD-10-CM | POA: Insufficient documentation

## 2017-11-02 DIAGNOSIS — E519 Thiamine deficiency, unspecified: Secondary | ICD-10-CM

## 2017-11-02 DIAGNOSIS — F419 Anxiety disorder, unspecified: Secondary | ICD-10-CM | POA: Insufficient documentation

## 2017-11-02 DIAGNOSIS — F17200 Nicotine dependence, unspecified, uncomplicated: Secondary | ICD-10-CM

## 2017-11-02 MED ORDER — LOPERAMIDE HCL 2 MG PO CAPS: 4.0000 mg | ORAL_CAPSULE | Freq: Once | ORAL | Status: DC

## 2017-11-02 NOTE — Patient Instructions (Signed)
Start the Lyrica after you complete your current bottle of Gabapentin.   Call Dr. Enis Gash to inform him of the recurrent diarrhea.  If things get worse, go to the hospital.   Try setting a quit date to stop smoking.

## 2017-11-02 NOTE — Progress Notes (Signed)
Patient ID: Jamie Allen, female    DOB: 1970-12-13  MRN: [id number]  CC: re-establish   Subjective: Jamie Allen is a 47 y.o. female who presents to become establish with me as PCP.  [name], her significant other, is with her.   Her concerns today include:  Pt with hx of ETOH cirrhosis, thiamine def, ACD, IBS, tob dep, hypothyroidism, recurrent C.diff x 3, RT ankle pain hx ORIF 2016, peripheral neuropathy  1.  C.diff: will have fecal transplant on 11/11/2017 by Dr. Enis Gash -on Fidaxomicin 200 mg -some diarrhea started yesterday after being free for 2 wks. Goes back to Vanc QID tomorrow  2. Thiamine def:  Taking supplement.  She has remain free of ETOH x 2 mths  3.  ETOH  Cirrhosis: free ETOH  x 2 mths. No LE edema. No ascites.  4.  Tobacco dependence: Smoking about 4 cigarettes a day.  She is trying to quit.  Is try the nicotine patches and gum in the past without success.  She plans to continue cutting back.  Patient Active Problem List   Diagnosis Date Noted  . Nausea   . RLQ abdominal tenderness   . Peripheral neuropathy   . Nonalcoholic steatohepatitis ([facility])   . Immune deficiency disorder ([place])   . Hypertension   . Drug-seeking behavior   . Ankle fracture, left   . Anemia   . Allergy   . Pancytopenia ([place])   . Alcoholic cirrhosis of liver ([place]) 09/22/2017  . Sepsis associated hypotension ([place]) 09/20/2017  . Fluid excess 06/30/2017  . C. difficile enteritis 06/30/2017  . Hypokalemia 06/29/2017  . Portal hypertensive gastropathy ([place]) 05/28/2017  . Anemia of chronic disease 05/28/2017  . Alcoholic cirrhosis of liver without ascites ([place])   . Hypotension 04/29/2017  . Abnormal CT scan, colon   . Abdominal pain 01/11/2017  . Hepatosplenomegaly 01/03/2017  . Obesity (BMI 30-39.9) 01/03/2017  . Hypomagnesemia 01/03/2017  . GERD (gastroesophageal reflux disease)   . Anxiety   . Chronic cholecystitis with calculus s/p lap cholecystectomy 12/25/2016 12/24/2016    . Postmenopausal syndrome 11/24/2016  . Hypothyroidism 12/06/2015  . Foot fracture 10/06/2015  . Bimalleolar fracture of right ankle 10/06/2015  . Abnormality of gait 10/02/2015  . Paresthesia 10/02/2015  . Intractable pain 09/23/2015  . Ankle fracture 09/23/2015  . Vitamin D deficiency 08/28/2015  . Neuropathic pain, leg, bilateral 08/27/2015  . IBS (irritable bowel syndrome) 08/27/2015  . Other and unspecified ovarian cyst 05/30/2014  . Anxiety and depression 04/19/2014  . C. difficile diarrhea 02/02/2014  . Post-traumatic stress 01/03/2014  . Hemorrhage 01/03/2014  . Colitis 01/03/2014  . Colitis presumed infectious 12/15/2013  . Antral gastritis 10/20/2013  . Nausea vomiting and diarrhea 07/21/2013     Current Outpatient Medications on File Prior to Visit  Medication Sig Dispense Refill  . dicyclomine (BENTYL) 10 MG capsule Take 1 capsule (10 mg total) by mouth 3 (three) times daily before meals. 90 capsule 3  . folic acid (FOLVITE) 1 MG tablet Take 1 tablet (1 mg total) by mouth daily. 30 tablet 0  . gabapentin (NEURONTIN) 300 MG capsule Take 1 capsule (300 mg total) by mouth 3 (three) times daily. 90 capsule 1  . hydrOXYzine (ATARAX/VISTARIL) 10 MG tablet Take 1 tablet (10 mg total) by mouth 2 (two) times daily. 60 tablet 3  . levothyroxine (SYNTHROID, LEVOTHROID) 75 MCG tablet Take 1 tablet (75 mcg total) by mouth daily before breakfast. 30 tablet 2  . pregabalin (LYRICA)  50 MG capsule Take 1 capsule (50 mg total) by mouth 3 (three) times daily. 540 capsule 1  . promethazine (PHENERGAN) 25 MG tablet Take 1 tablet (25 mg total) by mouth every 8 (eight) hours as needed for nausea or vomiting. 30 tablet 0  . Thiamine HCl (VITAMIN B-1 PO) Take 1 tablet by mouth daily.    . vancomycin (VANCOCIN) 125 MG capsule Take 1 capsule (125 mg total) by mouth 4 (four) times daily. 40 capsule 0  . feeding supplement (BOOST / RESOURCE BREEZE) LIQD Take 1 Container by mouth 3 (three) times  daily between meals. 90 Container 0  . nicotine polacrilex (NICORETTE) 4 MG gum Take 4 mg by mouth 4 (four) times daily as needed for smoking cessation.    Marland Kitchen PEG-KCl-NaCl-NaSulf-Na Asc-C (PLENVU) 140 g SOLR Take 1 kit by mouth as directed. (Patient not taking: Reported on 11/02/2017) 1 each 0  . vancomycin (VANCOCIN) 50 mg/mL oral solution Take 2.5 mLs (125 mg total) by mouth 4 (four) times daily. For 14 days  Please prescribe Oral vancomycin caps form (Patient not taking: Reported on 11/02/2017) 50 mL 0  . [DISCONTINUED] Calcium Citrate 200 MG TABS Take 2 tablets (400 mg total) by mouth daily. 30 tablet 11  . [DISCONTINUED] QUEtiapine (SEROQUEL) 50 MG tablet Take 6 tablets (300 mg total) by mouth at bedtime. For mood control (Patient not taking: Reported on 02/05/2015)     No current facility-administered medications on file prior to visit.     Allergies  Allergen Reactions  . Iohexol Hives, Itching and Swelling  . Morphine And Related Itching    Can tolerate with Benadryl    Social History   Socioeconomic History  . Marital status: Single    Spouse name: Not on file  . Number of children: 0  . Years of education: 11th  . Highest education level: Not on file  Social Needs  . Financial resource strain: Not on file  . Food insecurity - worry: Not on file  . Food insecurity - inability: Not on file  . Transportation needs - medical: Not on file  . Transportation needs - non-medical: Not on file  Occupational History  . Occupation: Unemployed  Tobacco Use  . Smoking status: Current Some Day Smoker    Packs/day: 0.25    Years: 20.00    Pack years: 5.00    Types: Cigarettes  . Smokeless tobacco: Never Used  . Tobacco comment: 4 cigs a day   Substance and Sexual Activity  . Alcohol use: No    Alcohol/week: 0.0 oz    Comment: no etoh now- used to be 21 glasses wine a week, did 1 beer a day but not doing that now either   . Drug use: No    Comment: Per patient - has not used  marijuana since her 67s  . Sexual activity: Not Currently    Birth control/protection: None    Comment: 01-03-14 States not sexually active with current boyfriend-due to pain and abdominal issues.  Other Topics Concern  . Not on file  Social History Narrative   Lives at home with her boyfriend, [name].   Right-handed.   Caffeine: 3 cups daily.   2 children.   Currently trying to get disability.     Previously was a Educational psychologist.        Family History  Problem Relation Age of Onset  . Hypertension Mother   . Hyperlipidemia Mother   . Suicidality Father   . Stomach  cancer Father   . Hypothyroidism Sister   . Breast cancer Maternal Grandmother   . Heart attack Maternal Grandfather   . Aneurysm Paternal Grandfather        brain   . Colon cancer Neg Hx   . Colon polyps Neg Hx   . Esophageal cancer Neg Hx   . Rectal cancer Neg Hx     Past Surgical History:  Procedure Laterality Date  . CHOLECYSTECTOMY N/A 12/25/2016   Procedure: LAPAROSCOPIC CHOLECYSTECTOMY WITH INTRAOPERATIVE CHOLANGIOGRAM;  Surgeon: Autumn Messing III, MD;  Location: [facility];  Service: General;  Laterality: N/A;  . COLONOSCOPY    . COLONOSCOPY N/A 05/01/2017   Procedure: COLONOSCOPY;  Surgeon: Jerene Bears, MD;  Location[facility] ENDOSCOPY;  Service: Endoscopy;  Laterality: N/A;  . COLONOSCOPY WITH PROPOFOL N/A 01/18/2014   Multiple small polyps (8) removed as above; Small internal hemorrhoids; No evidence of colitis  . ESOPHAGOGASTRODUODENOSCOPY N/A 02/06/2014   Antral Gastritis. Biopsies obtained not clear if this is related to her nausea and vomiting  . FLEXIBLE SIGMOIDOSCOPY N/A 12/17/2013   Procedure: FLEXIBLE SIGMOIDOSCOPY;  Surgeon: Missy Sabins, MD;  Location: [place];  Service: Endoscopy;  Laterality: N/A;  . ORIF ANKLE FRACTURE Right 10/07/2015   Procedure: OPEN REDUCTION INTERNAL FIXATION (ORIF)  BIMALLEOLAR ANKLE FRACTURE;  Surgeon: Marybelle Killings, MD;  Location: [place];  Service: Orthopedics;  Laterality: Right;    . POLYPECTOMY    . TONSILLECTOMY    . UPPER GASTROINTESTINAL ENDOSCOPY      ROS: Review of Systems Negative except as stated above. PHYSICAL EXAM: BP 115/77   Pulse 87   Temp 98.5 F (36.9 C) (Oral)   Resp 16   Wt 133 lb 3.2 oz (60.4 kg)   LMP 06/01/2014   SpO2 96%   BMI 25.17 kg/m   Wt Readings from Last 3 Encounters:  11/02/17 133 lb 3.2 oz (60.4 kg)  10/23/17 130 lb 2 oz (59 kg)  10/22/17 131 lb (59.4 kg)    Physical Exam  General appearance - alert, well appearing, and in no distress Mental status - alert, oriented to person, place, and time, normal mood, behavior, speech, dress, motor activity, and thought processes Mouth - mucous membranes moist, pharynx normal without lesions Neck - supple, no significant adenopathy Chest - clear to auscultation, no wheezes, rales or rhonchi, symmetric air entry Heart - normal rate, regular rhythm, normal S1, S2, no murmurs, rubs, clicks or gallops Extremities - peripheral pulses normal, no pedal edema, no clubbing or cyanosis  ASSESSMENT AND PLAN: 1. C. difficile colitis -3 recurrent episodes in the past few months.  She reports diarrhea started up again since yesterday.  I have asked her to contact Dr. Enis Gash for further instructions.  Plan is for her to go back on Vanco tomorrow after completing Fidaxomicin 200 mg until fecal transplant  2. Alcoholic cirrhosis of liver without ascites ([place]) Commended her on remaining free of alcohol.  Encouraged total abstinence  3. Thiamin deficiency Continue thiamine supplement.  4. Tobacco dependence Advised to quit smoking.  Went over methods to help her quit.  She feels she is able to do it by continuing to cut back.  I have encouraged her to set a quit date.  5. Neuropathic pain, leg, bilateral She has received Lyrica through the patient assistance program.  She still has some gabapentin which she is taking.  Advised to complete the current bottle of gabapentin and then changed to  Lyrica.  Patient was  given the opportunity to ask questions.  Patient verbalized understanding of the plan and was able to repeat key elements of the plan.   No orders of the defined types were placed in this encounter.    Requested Prescriptions    No prescriptions requested or ordered in this encounter    Return in about 3 months (around 01/31/2018).  Karle Plumber, MD, FACP

## 2017-11-02 NOTE — Telephone Encounter (Signed)
Left message for patient to call back as we have additional prep instructions to give her. Needs to ask for Dr. Doyne Keel nurse.

## 2017-11-03 ENCOUNTER — Telehealth: Payer: Self-pay

## 2017-11-03 NOTE — Telephone Encounter (Signed)
Spoke to patient and she is aware to take 2 tablets of imodium 4 hours prior to procedure. I have put orders in Epic to give patient 2 tablets following procedure, instructed her that if she did not receive this in recovery then will need to take it when she gets home.  Patient states she just finished up her antibiotic yesterday, fidaxomicin. According to Dr. Storm Frisk instructions she said that she would treat them up until 11/09/17. Do you want me to instruct her to contact Dr. Storm Frisk office?

## 2017-11-03 NOTE — Telephone Encounter (Signed)
Yes please ask her to call Dr. Storm Frisk office for recommendations regarding antibiotics. Thanks

## 2017-11-03 NOTE — Telephone Encounter (Signed)
Left message for patient to contact Dr. Storm Frisk office re: antibiotic.

## 2017-11-04 MED FILL — PLENVU 140 GM SOLR: 140 | 30 days supply | Qty: 3 | Fill #0

## 2017-11-11 ENCOUNTER — Ambulatory Visit (HOSPITAL_COMMUNITY): Payer: Self-pay | Admitting: Anesthesiology

## 2017-11-11 ENCOUNTER — Other Ambulatory Visit: Payer: Self-pay

## 2017-11-11 ENCOUNTER — Encounter (HOSPITAL_COMMUNITY): Payer: Self-pay

## 2017-11-11 ENCOUNTER — Ambulatory Visit (HOSPITAL_COMMUNITY)
Admission: RE | Admit: 2017-11-11 | Discharge: 2017-11-11 | Disposition: A | Discharge status: Home / Self Care | Payer: Self-pay | Source: Ambulatory Visit | Attending: Gastroenterology | Admitting: Gastroenterology

## 2017-11-11 ENCOUNTER — Encounter (HOSPITAL_COMMUNITY)
Admission: RE | Disposition: A | Discharge status: Home / Self Care | Payer: Self-pay | Source: Ambulatory Visit | Attending: Gastroenterology

## 2017-11-11 DIAGNOSIS — K648 Other hemorrhoids: Secondary | ICD-10-CM | POA: Insufficient documentation

## 2017-11-11 DIAGNOSIS — A071 Giardiasis [lambliasis]: Secondary | ICD-10-CM | POA: Insufficient documentation

## 2017-11-11 DIAGNOSIS — F17210 Nicotine dependence, cigarettes, uncomplicated: Secondary | ICD-10-CM | POA: Insufficient documentation

## 2017-11-11 DIAGNOSIS — K7581 Nonalcoholic steatohepatitis (NASH): Secondary | ICD-10-CM | POA: Insufficient documentation

## 2017-11-11 DIAGNOSIS — D849 Immunodeficiency, unspecified: Secondary | ICD-10-CM | POA: Insufficient documentation

## 2017-11-11 DIAGNOSIS — Z79899 Other long term (current) drug therapy: Secondary | ICD-10-CM | POA: Insufficient documentation

## 2017-11-11 DIAGNOSIS — Z888 Allergy status to other drugs, medicaments and biological substances status: Secondary | ICD-10-CM | POA: Insufficient documentation

## 2017-11-11 DIAGNOSIS — A0472 Enterocolitis due to Clostridium difficile, not specified as recurrent: Secondary | ICD-10-CM

## 2017-11-11 DIAGNOSIS — K7030 Alcoholic cirrhosis of liver without ascites: Secondary | ICD-10-CM | POA: Insufficient documentation

## 2017-11-11 DIAGNOSIS — F4310 Post-traumatic stress disorder, unspecified: Secondary | ICD-10-CM | POA: Insufficient documentation

## 2017-11-11 DIAGNOSIS — I700 Atherosclerosis of aorta: Secondary | ICD-10-CM | POA: Insufficient documentation

## 2017-11-11 DIAGNOSIS — K219 Gastro-esophageal reflux disease without esophagitis: Secondary | ICD-10-CM | POA: Insufficient documentation

## 2017-11-11 DIAGNOSIS — Z885 Allergy status to narcotic agent status: Secondary | ICD-10-CM | POA: Insufficient documentation

## 2017-11-11 DIAGNOSIS — R162 Hepatomegaly with splenomegaly, not elsewhere classified: Secondary | ICD-10-CM | POA: Insufficient documentation

## 2017-11-11 HISTORY — PX: COLONOSCOPY WITH PROPOFOL: SHX5780

## 2017-11-11 HISTORY — PX: FECAL TRANSPLANT: SHX6383

## 2017-11-11 SURGERY — COLONOSCOPY WITH PROPOFOL
Anesthesia: Monitor Anesthesia Care

## 2017-11-11 MED ORDER — LACTATED RINGERS IV SOLN
INTRAVENOUS | Status: DC
  Administered 2017-11-11: 1000 mL via INTRAVENOUS

## 2017-11-11 MED ORDER — PROPOFOL 10 MG/ML IV BOLUS
INTRAVENOUS | Status: AC
  Filled 2017-11-11: qty 60

## 2017-11-11 MED ORDER — PROPOFOL 10 MG/ML IV BOLUS
INTRAVENOUS | Status: DC | PRN
  Administered 2017-11-11 (×2): 20 mg via INTRAVENOUS
  Administered 2017-11-11 (×2): 30 mg via INTRAVENOUS

## 2017-11-11 MED ORDER — SODIUM CHLORIDE 0.9 % IV SOLN: INTRAVENOUS | Status: DC

## 2017-11-11 MED ORDER — PROPOFOL 500 MG/50ML IV EMUL
INTRAVENOUS | Status: DC | PRN
  Administered 2017-11-11: 125 ug/kg/min via INTRAVENOUS

## 2017-11-11 MED ORDER — ONDANSETRON HCL 4 MG/2ML IJ SOLN
INTRAMUSCULAR | Status: DC | PRN
  Administered 2017-11-11: 4 mg via INTRAVENOUS

## 2017-11-11 MED ORDER — LOPERAMIDE HCL 2 MG PO CAPS
4.0000 mg | ORAL_CAPSULE | Freq: Once | ORAL | Status: AC
  Administered 2017-11-11: 4 mg via ORAL
  Filled 2017-11-11: qty 2

## 2017-11-11 MED ORDER — LIDOCAINE 2% (20 MG/ML) 5 ML SYRINGE
INTRAMUSCULAR | Status: DC | PRN
  Administered 2017-11-11: 75 mg via INTRAVENOUS

## 2017-11-11 SURGICAL SUPPLY — 22 items
ELECT REM PT RETURN 9FT ADLT (ELECTROSURGICAL)
ELECTRODE REM PT RTRN 9FT ADLT (ELECTROSURGICAL) IMPLANT
FCP BXJMBJMB 240X2.8X (CUTTING FORCEPS)
FLOOR PAD 36X40 (MISCELLANEOUS) ×3
FORCEPS BIOP RAD 4 LRG CAP 4 (CUTTING FORCEPS) IMPLANT
FORCEPS BIOP RJ4 240 W/NDL (CUTTING FORCEPS)
FORCEPS BXJMBJMB 240X2.8X (CUTTING FORCEPS) IMPLANT
INJECTOR/SNARE I SNARE (MISCELLANEOUS) IMPLANT
LUBRICANT JELLY 4.5OZ STERILE (MISCELLANEOUS) IMPLANT
MANIFOLD NEPTUNE II (INSTRUMENTS) IMPLANT
NEEDLE SCLEROTHERAPY 25GX240 (NEEDLE) IMPLANT
PAD FLOOR 36X40 (MISCELLANEOUS) ×1 IMPLANT
PREP MICROBIOTA FECAL (Tissue) ×3 IMPLANT
PROBE APC STR FIRE (PROBE) IMPLANT
PROBE INJECTION GOLD (MISCELLANEOUS)
PROBE INJECTION GOLD 7FR (MISCELLANEOUS) IMPLANT
SNARE ROTATE MED OVAL 20MM (MISCELLANEOUS) IMPLANT
SYR 50ML LL SCALE MARK (SYRINGE) IMPLANT
TRAP SPECIMEN MUCOUS 40CC (MISCELLANEOUS) IMPLANT
TUBING ENDO SMARTCAP PENTAX (MISCELLANEOUS) IMPLANT
TUBING IRRIGATION ENDOGATOR (MISCELLANEOUS) ×3 IMPLANT
WATER STERILE IRR 1000ML POUR (IV SOLUTION) IMPLANT

## 2017-11-11 NOTE — Op Note (Signed)
[facility] Patient Name: Jamie Allen Procedure Date: 11/11/2017 MRN: [id number] Attending MD: Carlota Raspberry. 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 Jomarie Gellis MD, MD, Cleda Daub, RN,                            Alan Mulder, Technician, [place] Zenia Resides CRNA,                            CRNA Referring MD:              Medicines:                Monitored Anesthesia Care Complications:            No immediate complications. Estimated blood loss:                            None. Estimated Blood Loss:     Estimated blood loss: none. Procedure:                Pre-Anesthesia Assessment:                           - Prior to the procedure, a History and Physical                            was performed, and patient medications and                            allergies were reviewed. The patient's tolerance of                            previous anesthesia was also reviewed. The risks                            and benefits of the procedure and the sedation                            options and risks were discussed with the patient.                            All questions were answered, and informed consent                            was obtained. Prior Anticoagulants: The patient has                            taken no previous anticoagulant or antiplatelet  agents. ASA Grade Assessment: III - A patient with                            severe systemic disease. After reviewing the risks                            and benefits, the patient was deemed in                            satisfactory condition to undergo the procedure.                           After obtaining informed consent, the colonoscope                  was passed under direct vision. Throughout the                            procedure, the patient's blood pressure, pulse, and                            oxygen saturations were monitored continuously. The                            EC-[id number] ([id number]) scope was introduced through                            the anus and advanced to the the terminal ileum,                            with identification of the appendiceal orifice and                            IC valve. The colonoscopy was performed without                            difficulty. The patient tolerated the procedure                            well. The quality of the bowel preparation was                            adequate. The terminal ileum, ileocecal valve,                            appendiceal orifice, and rectum were photographed. Scope In: 2:57:51 PM Scope Out: 3:03:50 PM Scope Withdrawal Time: 0 hours 3 minutes 29 seconds  Total Procedure Duration: 0 hours 5 minutes 59 seconds  Findings:      The perianal and digital rectal examinations were normal.      The colon appeared normal during insertion, no obvious inflammatory       changes.      The terminal ileum appeared normal.      Internal hemorrhoids were found during retroflexion.      When the ileum was reached, 3 syringes  of donated stool were infused       into the ileum, and 2 syringes were infused into the cecum. Time was not       taken during withdrawl to evaluate the colonic mucosa for a screening       exam in order to maximize stool retention. Impression:               - The entire examined colon apeared normal.                           - The examined portion of the ileum was normal.                           - Internal hemorrhoids.                           - Fecal transplant performed as outlined. Moderate Sedation:      No moderate sedation, case performed with MAC Recommendation:           - Patient has a contact number available for                             emergencies. The signs and symptoms of potential                            delayed complications were discussed with the                            patient. Return to normal activities tomorrow.                            Written discharge instructions were provided to the                            patient.                           - Resume previous diet.                           - Continue present medications.                           - 2 tablets of immodium PO now                           - Follow up with Dr. Baxter Flattery of infectious diseases                            as scheduled                           - Please call with any recurrence of symptoms Procedure Code(s):        --- Professional ---                           [id number], Colonoscopy,  flexible; diagnostic, including                            collection of specimen(s) by brushing or washing,                            when performed (separate procedure) Diagnosis Code(s):        --- Professional ---                           K64.8, Other hemorrhoids                           A04.7, Enterocolitis due to Clostridium difficile CPT copyright 2016 [employer]. All rights reserved. The codes documented in this report are preliminary and upon coder review may  be revised to meet current compliance requirements. Remo Lipps P. Casmira Cramer MD, MD 11/11/2017 3:21:44 PM This report has been signed electronically. Number of Addenda: 0

## 2017-11-11 NOTE — Discharge Instructions (Signed)
YOU HAD AN ENDOSCOPIC PROCEDURE TODAY: Refer to the procedure report and other information in the discharge instructions given to you for any specific questions about what was found during the examination. If this information does not answer your questions, please call [place] office at [phone] to clarify.  ° °YOU SHOULD EXPECT: Some feelings of bloating in the abdomen. Passage of more gas than usual. Walking can help get rid of the air that was put into your GI tract during the procedure and reduce the bloating. If you had a lower endoscopy (such as a colonoscopy or flexible sigmoidoscopy) you may notice spotting of blood in your stool or on the toilet paper. Some abdominal soreness may be present for a day or two, also. ° °DIET: Your first meal following the procedure should be a light meal and then it is ok to progress to your normal diet. A half-sandwich or bowl of soup is an example of a good first meal. Heavy or fried foods are harder to digest and may make you feel nauseous or bloated. Drink plenty of fluids but you should avoid alcoholic beverages for 24 hours. If you had a esophageal dilation, please see attached instructions for diet.   ° °ACTIVITY: Your care partner should take you home directly after the procedure. You should plan to take it easy, moving slowly for the rest of the day. You can resume normal activity the day after the procedure however YOU SHOULD NOT DRIVE, use power tools, machinery or perform tasks that involve climbing or major physical exertion for 24 hours (because of the sedation medicines used during the test).  ° °SYMPTOMS TO REPORT IMMEDIATELY: °A gastroenterologist can be reached at any hour. Please call [phone]  for any of the following symptoms:  °Following lower endoscopy (colonoscopy, flexible sigmoidoscopy) °Excessive amounts of blood in the stool  °Significant tenderness, worsening of abdominal pains  °Swelling of the abdomen that is new, acute  °Fever of 100° or  higher  °Following upper endoscopy (EGD, EUS, ERCP, esophageal dilation) °Vomiting of blood or coffee ground material  °New, significant abdominal pain  °New, significant chest pain or pain under the shoulder blades  °Painful or persistently difficult swallowing  °New shortness of breath  °Black, tarry-looking or red, bloody stools ° °FOLLOW UP:  °If any biopsies were taken you will be contacted by phone or by letter within the next 1-3 weeks. Call [phone]  if you have not heard about the biopsies in 3 weeks.  °Please also call with any specific questions about appointments or follow up tests. ° °

## 2017-11-11 NOTE — Transfer of Care (Signed)
Immediate Anesthesia Transfer of Care Note  Patient: Jamie Allen  Procedure(s) Performed: COLONOSCOPY WITH PROPOFOL (N/A ) FECAL TRANSPLANT (N/A )  Patient Location: PACU and Endoscopy Unit  Anesthesia Type:MAC  Level of Consciousness: awake, alert , oriented and patient cooperative  Airway & Oxygen Therapy: Patient Spontanous Breathing and Patient connected to face mask oxygen  Post-op Assessment: Report given to RN and Post -op Vital signs reviewed and stable  Post vital signs: Reviewed and stable  Last Vitals:  Vitals:   11/11/17 1354  BP: (!) 115/57  Pulse: 86  Resp: 19  Temp: 37.4 C  SpO2: 94%    Last Pain:  Vitals:   11/11/17 1354  TempSrc: Oral  PainSc: 7          Complications: No apparent anesthesia complications

## 2017-11-11 NOTE — Interval H&P Note (Signed)
History and Physical Interval Note:  11/11/2017 2:42 PM  Jamie Allen  has presented today for surgery, with the diagnosis of chronic diarrhea, c-diff  The various methods of treatment have been discussed with the patient and family. After consideration of risks, benefits and other options for treatment, the patient has consented to  Procedure(s): COLONOSCOPY WITH PROPOFOL (N/A) FECAL TRANSPLANT (N/A) as a surgical intervention .  The patient's history has been reviewed, patient examined, no change in status, stable for surgery.  I have reviewed the patient's chart and labs.  Questions were answered to the patient's satisfaction.     Patient has been evaluated and cleared for fecal transplant with Dr. Baxter Allen of ID who helped set this patient up for this procedure. Discussed risks / benefits and she wishes to proceed.     [name]

## 2017-11-11 NOTE — Anesthesia Preprocedure Evaluation (Addendum)
Anesthesia Evaluation  Patient identified by MRN, date of birth, ID band Patient awake    Reviewed: Allergy & Precautions, NPO status , Patient's Chart, lab work & pertinent test results  Airway Mallampati: II  TM Distance: >3 FB Neck ROM: Full    Dental  (+) Poor Dentition   Pulmonary Current Smoker,    Pulmonary exam normal breath sounds clear to auscultation       Cardiovascular Normal cardiovascular exam Rhythm:Regular Rate:Normal  ECG: SR, rate 94  ECHO: LV EF: 50% -   55%  Sees cardiologist   Neuro/Psych PSYCHIATRIC DISORDERS Anxiety Post-traumatic stress    GI/Hepatic (+) Cirrhosis   ascites    , Nonalcoholic steatohepatitis (NASH) Colitis   Endo/Other  Hypothyroidism   Renal/GU negative Renal ROS     Musculoskeletal negative musculoskeletal ROS (+)   Abdominal   Peds  Hematology negative hematology ROS (+)   Anesthesia Other Findings chronic diarrhea c-diff  Reproductive/Obstetrics                            Anesthesia Physical Anesthesia Plan  ASA: III  Anesthesia Plan: MAC   Post-op Pain Management:    Induction: Intravenous  PONV Risk Score and Plan: 1 and Ondansetron, Midazolam, Treatment may vary due to age or medical condition and Dexamethasone  Airway Management Planned: Natural Airway  Additional Equipment:   Intra-op Plan:   Post-operative Plan:   Informed Consent: I have reviewed the patients History and Physical, chart, labs and discussed the procedure including the risks, benefits and alternatives for the proposed anesthesia with the patient or authorized representative who has indicated his/her understanding and acceptance.   Dental advisory given  Plan Discussed with: CRNA  Anesthesia Plan Comments:        Anesthesia Quick Evaluation

## 2017-11-12 ENCOUNTER — Encounter (HOSPITAL_COMMUNITY): Payer: Self-pay | Admitting: Gastroenterology

## 2017-11-12 NOTE — Anesthesia Postprocedure Evaluation (Signed)
Anesthesia Post Note  Patient: Jamie Allen  Procedure(s) Performed: COLONOSCOPY WITH PROPOFOL (N/A ) FECAL TRANSPLANT (N/A )     Patient location during evaluation: PACU Anesthesia Type: MAC Level of consciousness: awake and alert Pain management: pain level controlled Vital Signs Assessment: post-procedure vital signs reviewed and stable Respiratory status: spontaneous breathing, nonlabored ventilation, respiratory function stable and patient connected to nasal cannula oxygen Cardiovascular status: stable and blood pressure returned to baseline Postop Assessment: no apparent nausea or vomiting Anesthetic complications: no    Last Vitals:  Vitals:   11/11/17 1545 11/11/17 1555  BP: (!) 126/48 132/72  Pulse: 88 93  Resp: (!) 22 (!) 24  Temp:    SpO2: 94% 98%    Last Pain:  Vitals:   11/11/17 1555  TempSrc:   PainSc: 10-Worst pain ever                 Jamie Allen

## 2017-11-18 ENCOUNTER — Other Ambulatory Visit: Payer: Self-pay

## 2017-11-18 ENCOUNTER — Telehealth: Payer: Self-pay | Admitting: Gastroenterology

## 2017-11-18 NOTE — Progress Notes (Signed)
First Hep A/B was administered elsewhere. Second was administered here on 06-23-17.  Third of three is due 11-23-17.

## 2017-11-18 NOTE — Telephone Encounter (Signed)
Spoke to patient, she states she has had diarrhea since procedure last week, a lot of gas/gas pain, RLQ. She states she feels normal in the morning but as soon as she eats or drinks something then her symptoms return. She states she had some blood in stool last week and low grade fever, now denies either of these.

## 2017-11-18 NOTE — Telephone Encounter (Signed)
Thanks for the update Almyra Free I'm sorry to hear this. Has she been in touch with Dr. Baxter Flattery of ID about this? If not I'll send her a message. I'm assuming she may want to check her for recurrent C Diff. I am hoping this is not the case and that the transplant was successful but I'm concerned about recurrent C Diff.

## 2017-11-18 NOTE — Telephone Encounter (Signed)
Spoke to patient, she did call Dr. Storm Frisk office 2 days after the procedure but has not heard back from them.

## 2017-11-18 NOTE — Telephone Encounter (Signed)
-----   Message from Larina Bras, [place] sent at 11/17/2017  5:08 PM EST -----   ----- Message ----- From: Larina Bras, CMA Sent: 11/17/2017 To: Larina Bras, CMA  November 23 2017, pt needs twinrix standard #3 (armbruster. See 06/23/17 office note). Of note, pt shots will be a bit off. Twinrix #1 shot was given seperately elsewhere and twinrix #2 was given here. Twinrix 3 is to be given 5 months after twinrix #2 injection.

## 2017-11-18 NOTE — Telephone Encounter (Signed)
Called to schedule pt for 3rd Twinrix inj.  Pt reports diarrhea constantly since fecal transplant last week. Her stomach is gurgling extremely loudly and she is having lots of cramping. Every time she eats she is in the bathroom with terrible diarrhea.  She is trying to drink Gatorade and water but she can't stay ahead of the dehydration and reports her lips are dry and chapped.   I scheduled her for Monday, 11/23/17 for her 3rd twinrix injection.

## 2017-11-19 ENCOUNTER — Other Ambulatory Visit: Payer: Self-pay

## 2017-11-19 DIAGNOSIS — R197 Diarrhea, unspecified: Secondary | ICD-10-CM

## 2017-11-19 NOTE — Telephone Encounter (Signed)
Okay. Can you order a C Diff PCR and have her go to the lab for submission. I will be out of the office tomorrow in case this returns, can you keep an eye out for the result if it comes back tomorrow? Thanks

## 2017-11-19 NOTE — Telephone Encounter (Signed)
Patient advised and test ordered.

## 2017-11-20 ENCOUNTER — Telehealth: Payer: Self-pay

## 2017-11-20 NOTE — Telephone Encounter (Signed)
Patient has not submitted stool study yet.

## 2017-11-23 ENCOUNTER — Ambulatory Visit (INDEPENDENT_AMBULATORY_CARE_PROVIDER_SITE_OTHER): Payer: Self-pay | Admitting: Gastroenterology

## 2017-11-23 ENCOUNTER — Telehealth: Payer: Self-pay

## 2017-11-23 DIAGNOSIS — Z23 Encounter for immunization: Secondary | ICD-10-CM

## 2017-11-23 DIAGNOSIS — K7460 Unspecified cirrhosis of liver: Secondary | ICD-10-CM

## 2017-11-23 NOTE — Telephone Encounter (Signed)
-----   Message from Yetta Flock, MD sent at 11/23/2017 11:03 AM EST ----- Regarding: RE: Order titer? Thanks Jan for letting me know.  Given you just gave her another today, I would hold off for a while. I'll discuss with her at her routine follow up. Thanks   ----- Message ----- From: Roetta Sessions, CMA Sent: 11/23/2017   9:43 AM To: Yetta Flock, MD Subject: Order titer?                                   Dr. Havery Moros,  This pt claims to have received a Twinrix injection at [facility] in August 2018.  We gave her what we thought was her 2nd in Sept 2018 and then she was in today for her 3rd (11-23-17).  I called Dr. Adrian Blackwater' office at [facility] to see if we could get some documentation for her chart for the 1st injection but they report they did not give her an injection in August. She had one in April of last year. I don't know how she got so off schedule.  I am wondering if you would like to order a titer.  If so, how long do we need to wait? Thanks, Jan

## 2017-11-23 NOTE — Telephone Encounter (Signed)
Ok. Thanks!

## 2017-11-23 NOTE — Progress Notes (Signed)
The pt advised she was given her first dose of twin rix at Dr Adrian Blackwater office in 05/2017.  She will have the records of this sent to our office to be added to Epic.

## 2017-11-24 ENCOUNTER — Other Ambulatory Visit: Payer: Self-pay

## 2017-11-24 DIAGNOSIS — R197 Diarrhea, unspecified: Secondary | ICD-10-CM

## 2017-11-26 LAB — CLOSTRIDIUM DIFFICILE EIA: C DIFFICILE TOXINS A+ B, EIA: NEGATIVE

## 2017-11-27 ENCOUNTER — Telehealth: Payer: Self-pay | Admitting: Gastroenterology

## 2017-12-18 MED FILL — PROMETHAZINE 25 MG TABLET: 25 | 15 days supply | Qty: 45 | Fill #1

## 2017-12-18 MED FILL — ?LEVOTHYROXINE 75 MCG TABLE: 75 | 30 days supply | Qty: 30 | Fill #0

## 2017-12-23 ENCOUNTER — Emergency Department (HOSPITAL_COMMUNITY): Payer: Medicaid Other

## 2017-12-23 ENCOUNTER — Inpatient Hospital Stay (HOSPITAL_COMMUNITY)
Admission: EM | Admit: 2017-12-23 | Discharge: 2018-01-08 | DRG: 871 | Disposition: A | Discharge status: Home / Self Care | Payer: Medicaid Other | Attending: Internal Medicine | Admitting: Internal Medicine

## 2017-12-23 ENCOUNTER — Other Ambulatory Visit: Payer: Self-pay

## 2017-12-23 ENCOUNTER — Encounter (HOSPITAL_COMMUNITY): Payer: Self-pay | Admitting: Emergency Medicine

## 2017-12-23 DIAGNOSIS — D649 Anemia, unspecified: Secondary | ICD-10-CM

## 2017-12-23 DIAGNOSIS — F17210 Nicotine dependence, cigarettes, uncomplicated: Secondary | ICD-10-CM | POA: Diagnosis present

## 2017-12-23 DIAGNOSIS — K582 Mixed irritable bowel syndrome: Secondary | ICD-10-CM | POA: Diagnosis present

## 2017-12-23 DIAGNOSIS — D638 Anemia in other chronic diseases classified elsewhere: Secondary | ICD-10-CM

## 2017-12-23 DIAGNOSIS — N179 Acute kidney failure, unspecified: Secondary | ICD-10-CM | POA: Diagnosis present

## 2017-12-23 DIAGNOSIS — E871 Hypo-osmolality and hyponatremia: Secondary | ICD-10-CM | POA: Diagnosis present

## 2017-12-23 DIAGNOSIS — D7589 Other specified diseases of blood and blood-forming organs: Secondary | ICD-10-CM | POA: Diagnosis present

## 2017-12-23 DIAGNOSIS — Z8619 Personal history of other infectious and parasitic diseases: Secondary | ICD-10-CM

## 2017-12-23 DIAGNOSIS — E872 Acidosis: Secondary | ICD-10-CM | POA: Diagnosis present

## 2017-12-23 DIAGNOSIS — Z79890 Hormone replacement therapy (postmenopausal): Secondary | ICD-10-CM

## 2017-12-23 DIAGNOSIS — E669 Obesity, unspecified: Secondary | ICD-10-CM | POA: Diagnosis present

## 2017-12-23 DIAGNOSIS — G629 Polyneuropathy, unspecified: Secondary | ICD-10-CM | POA: Diagnosis present

## 2017-12-23 DIAGNOSIS — F419 Anxiety disorder, unspecified: Secondary | ICD-10-CM | POA: Diagnosis present

## 2017-12-23 DIAGNOSIS — A0472 Enterocolitis due to Clostridium difficile, not specified as recurrent: Secondary | ICD-10-CM

## 2017-12-23 DIAGNOSIS — G8929 Other chronic pain: Secondary | ICD-10-CM | POA: Diagnosis present

## 2017-12-23 DIAGNOSIS — D539 Nutritional anemia, unspecified: Secondary | ICD-10-CM | POA: Diagnosis present

## 2017-12-23 DIAGNOSIS — R110 Nausea: Secondary | ICD-10-CM

## 2017-12-23 DIAGNOSIS — E039 Hypothyroidism, unspecified: Secondary | ICD-10-CM | POA: Diagnosis present

## 2017-12-23 DIAGNOSIS — Z8249 Family history of ischemic heart disease and other diseases of the circulatory system: Secondary | ICD-10-CM

## 2017-12-23 DIAGNOSIS — K648 Other hemorrhoids: Secondary | ICD-10-CM | POA: Diagnosis present

## 2017-12-23 DIAGNOSIS — R945 Abnormal results of liver function studies: Secondary | ICD-10-CM | POA: Insufficient documentation

## 2017-12-23 DIAGNOSIS — Z79899 Other long term (current) drug therapy: Secondary | ICD-10-CM

## 2017-12-23 DIAGNOSIS — D6959 Other secondary thrombocytopenia: Secondary | ICD-10-CM | POA: Diagnosis present

## 2017-12-23 DIAGNOSIS — A419 Sepsis, unspecified organism: Principal | ICD-10-CM | POA: Diagnosis present

## 2017-12-23 DIAGNOSIS — I10 Essential (primary) hypertension: Secondary | ICD-10-CM | POA: Diagnosis present

## 2017-12-23 DIAGNOSIS — Z9049 Acquired absence of other specified parts of digestive tract: Secondary | ICD-10-CM

## 2017-12-23 DIAGNOSIS — Z720 Tobacco use: Secondary | ICD-10-CM | POA: Diagnosis present

## 2017-12-23 DIAGNOSIS — K7031 Alcoholic cirrhosis of liver with ascites: Secondary | ICD-10-CM | POA: Diagnosis present

## 2017-12-23 DIAGNOSIS — F1010 Alcohol abuse, uncomplicated: Secondary | ICD-10-CM | POA: Diagnosis present

## 2017-12-23 DIAGNOSIS — R162 Hepatomegaly with splenomegaly, not elsewhere classified: Secondary | ICD-10-CM | POA: Diagnosis present

## 2017-12-23 DIAGNOSIS — F4310 Post-traumatic stress disorder, unspecified: Secondary | ICD-10-CM | POA: Diagnosis present

## 2017-12-23 DIAGNOSIS — D696 Thrombocytopenia, unspecified: Secondary | ICD-10-CM | POA: Diagnosis present

## 2017-12-23 DIAGNOSIS — K7581 Nonalcoholic steatohepatitis (NASH): Secondary | ICD-10-CM | POA: Diagnosis present

## 2017-12-23 DIAGNOSIS — R140 Abdominal distension (gaseous): Secondary | ICD-10-CM

## 2017-12-23 DIAGNOSIS — I472 Ventricular tachycardia: Secondary | ICD-10-CM | POA: Diagnosis not present

## 2017-12-23 DIAGNOSIS — G9340 Encephalopathy, unspecified: Secondary | ICD-10-CM | POA: Diagnosis present

## 2017-12-23 DIAGNOSIS — R6521 Severe sepsis with septic shock: Secondary | ICD-10-CM | POA: Diagnosis present

## 2017-12-23 DIAGNOSIS — F329 Major depressive disorder, single episode, unspecified: Secondary | ICD-10-CM | POA: Diagnosis present

## 2017-12-23 DIAGNOSIS — R509 Fever, unspecified: Secondary | ICD-10-CM | POA: Diagnosis present

## 2017-12-23 DIAGNOSIS — A0471 Enterocolitis due to Clostridium difficile, recurrent: Secondary | ICD-10-CM | POA: Diagnosis present

## 2017-12-23 DIAGNOSIS — K219 Gastro-esophageal reflux disease without esophagitis: Secondary | ICD-10-CM | POA: Diagnosis present

## 2017-12-23 DIAGNOSIS — K921 Melena: Secondary | ICD-10-CM | POA: Diagnosis not present

## 2017-12-23 DIAGNOSIS — D61818 Other pancytopenia: Secondary | ICD-10-CM | POA: Diagnosis present

## 2017-12-23 DIAGNOSIS — R102 Pelvic and perineal pain: Secondary | ICD-10-CM

## 2017-12-23 DIAGNOSIS — Z6826 Body mass index (BMI) 26.0-26.9, adult: Secondary | ICD-10-CM

## 2017-12-23 DIAGNOSIS — R7989 Other specified abnormal findings of blood chemistry: Secondary | ICD-10-CM | POA: Insufficient documentation

## 2017-12-23 DIAGNOSIS — D689 Coagulation defect, unspecified: Secondary | ICD-10-CM | POA: Diagnosis present

## 2017-12-23 DIAGNOSIS — N390 Urinary tract infection, site not specified: Secondary | ICD-10-CM | POA: Diagnosis present

## 2017-12-23 HISTORY — DX: Unspecified fracture of left foot, initial encounter for closed fracture: S92.902A

## 2017-12-23 HISTORY — DX: Personal history of other medical treatment: Z92.89

## 2017-12-23 HISTORY — DX: Cardiac murmur, unspecified: R01.1

## 2017-12-23 HISTORY — DX: Hypothyroidism, unspecified: E03.9

## 2017-12-23 LAB — DIC (DISSEMINATED INTRAVASCULAR COAGULATION) PANEL
PLATELETS: 61 10*3/uL — AB (ref 150–400)
SMEAR REVIEW: NONE SEEN

## 2017-12-23 LAB — COMPREHENSIVE METABOLIC PANEL
ALBUMIN: 3 g/dL — AB (ref 3.5–5.0)
ALT: 19 U/L (ref 14–54)
ANION GAP: 14 (ref 5–15)
AST: 49 U/L — ABNORMAL HIGH (ref 15–41)
Alkaline Phosphatase: 135 U/L — ABNORMAL HIGH (ref 38–126)
BILIRUBIN TOTAL: 5.5 mg/dL — AB (ref 0.3–1.2)
BUN: <5 mg/dL — ABNORMAL LOW (ref 6–20)
CO2: 20 mmol/L — ABNORMAL LOW (ref 22–32)
Calcium: 8.8 mg/dL — ABNORMAL LOW (ref 8.9–10.3)
Chloride: 98 mmol/L — ABNORMAL LOW (ref 101–111)
Creatinine, Ser: 0.9 mg/dL (ref 0.44–1.00)
GFR calc Af Amer: >60 mL/min (ref >60)
GFR calc non Af Amer: >60 mL/min (ref >60)
GLUCOSE: 136 mg/dL — AB (ref 65–99)
POTASSIUM: 3.8 mmol/L (ref 3.5–5.1)
Sodium: 132 mmol/L — ABNORMAL LOW (ref 135–145)
TOTAL PROTEIN: 7.6 g/dL (ref 6.5–8.1)

## 2017-12-23 LAB — CBC WITH DIFFERENTIAL/PLATELET
Basophils Absolute: 0 10*3/uL (ref 0.0–0.1)
Basophils Relative: 1 %
Eosinophils Absolute: 0 10*3/uL (ref 0.0–0.7)
Eosinophils Relative: 1 %
HCT: 15.8 % — ABNORMAL LOW (ref 36.0–46.0)
Hemoglobin: 5.4 g/dL — CL (ref 12.0–15.0)
LYMPHS ABS: 0.5 10*3/uL — AB (ref 0.7–4.0)
Lymphocytes Relative: 11 %
MCH: 42.5 pg — AB (ref 26.0–34.0)
MCHC: 34.2 g/dL (ref 30.0–36.0)
MCV: 124.4 fL — ABNORMAL HIGH (ref 78.0–100.0)
MONO ABS: 0.3 10*3/uL (ref 0.1–1.0)
Monocytes Relative: 6 %
NEUTROS PCT: 81 %
Neutro Abs: 3.9 10*3/uL (ref 1.7–7.7)
Platelets: 65 10*3/uL — ABNORMAL LOW (ref 150–400)
RBC: 1.27 MIL/uL — AB (ref 3.87–5.11)
RDW: 15.8 % — AB (ref 11.5–15.5)
WBC: 4.7 10*3/uL (ref 4.0–10.5)

## 2017-12-23 LAB — DIC (DISSEMINATED INTRAVASCULAR COAGULATION)PANEL
D-Dimer, Quant: 8.46 ug/mL-FEU — ABNORMAL HIGH (ref 0.00–0.50)
Fibrinogen: 281 mg/dL (ref 210–475)
INR: 1.8
Prothrombin Time: 20.8 seconds — ABNORMAL HIGH (ref 11.4–15.2)
aPTT: 44 seconds — ABNORMAL HIGH (ref 24–36)

## 2017-12-23 LAB — SAVE SMEAR

## 2017-12-23 LAB — PROTIME-INR
INR: 1.64
PROTHROMBIN TIME: 19.3 s — AB (ref 11.4–15.2)

## 2017-12-23 LAB — I-STAT CG4 LACTIC ACID, ED
LACTIC ACID, VENOUS: 4.83 mmol/L — AB (ref 0.5–1.9)
Lactic Acid, Venous: 3.79 mmol/L (ref 0.5–1.9)

## 2017-12-23 LAB — I-STAT BETA HCG BLOOD, ED (MC, WL, AP ONLY)

## 2017-12-23 LAB — LIPASE, BLOOD: LIPASE: 33 U/L (ref 11–51)

## 2017-12-23 LAB — PREPARE RBC (CROSSMATCH)

## 2017-12-23 LAB — LACTATE DEHYDROGENASE: LDH: 185 U/L (ref 98–192)

## 2017-12-23 MED ORDER — PIPERACILLIN-TAZOBACTAM 3.375 G IVPB
3.3750 g | Freq: Three times a day (TID) | INTRAVENOUS | Status: DC
  Administered 2017-12-24 – 2017-12-26 (×7): 3.375 g via INTRAVENOUS
  Filled 2017-12-23 (×8): qty 50

## 2017-12-23 MED ORDER — PIPERACILLIN-TAZOBACTAM 3.375 G IVPB 30 MIN
3.3750 g | Freq: Once | INTRAVENOUS | Status: AC
  Administered 2017-12-23: 3.375 g via INTRAVENOUS
  Filled 2017-12-23: qty 50

## 2017-12-23 MED ORDER — VANCOMYCIN HCL IN DEXTROSE 750-5 MG/150ML-% IV SOLN
750.0000 mg | Freq: Two times a day (BID) | INTRAVENOUS | Status: DC
  Administered 2017-12-24 – 2017-12-25 (×3): 750 mg via INTRAVENOUS
  Filled 2017-12-23 (×3): qty 150

## 2017-12-23 MED ORDER — SODIUM CHLORIDE 0.9 % IV SOLN: 10.0000 mL/h | Freq: Once | INTRAVENOUS | Status: DC

## 2017-12-23 MED ORDER — SODIUM CHLORIDE 0.9 % IV BOLUS (SEPSIS)
2000.0000 mL | Freq: Once | INTRAVENOUS | Status: AC
  Administered 2017-12-23: 2000 mL via INTRAVENOUS

## 2017-12-23 MED ORDER — BARIUM SULFATE 2.1 % PO SUSP
ORAL | Status: AC
  Filled 2017-12-23: qty 2

## 2017-12-23 MED ORDER — ACETAMINOPHEN 500 MG PO TABS
1000.0000 mg | ORAL_TABLET | Freq: Once | ORAL | Status: AC
  Administered 2017-12-23: 1000 mg via ORAL
  Filled 2017-12-23: qty 2

## 2017-12-23 MED ORDER — VANCOMYCIN HCL 10 G IV SOLR
1250.0000 mg | Freq: Once | INTRAVENOUS | Status: AC
  Administered 2017-12-23: 1250 mg via INTRAVENOUS
  Filled 2017-12-23: qty 1250

## 2017-12-23 MED ORDER — DEXTROSE 5 % IV SOLN
0.0000 ug/min | Freq: Once | INTRAVENOUS | Status: DC
  Filled 2017-12-23: qty 4

## 2017-12-23 NOTE — ED Notes (Signed)
Nandigam to [facility]

## 2017-12-23 NOTE — Progress Notes (Signed)
Pharmacy Antibiotic Note  Ceceilia Cephus is a 47 y.o. female admitted on 12/23/2017 with sepsis.  Pharmacy has been consulted for vancomycin and zosyn dosing. WBC 4.7. SCr 0.9. CrCl ~ 65-70 mL/min. Pt w/ recent h/o fecal transplant for C.diff. Discussed case with MD about risks/benefits of broad spectrum abx in patient with history of repeat C.diff. Given likely underlying infection and no clear source, will initiate broad spectrum abx for now   Plan: -Vancomycin 1250 mg IV once, then vancomycin 750 mg IV Q 12 hours -Zosyn 3.375 gm IV once over 30 minutes, then Zosyn 3.75 gm IV Q 8 hours (EI infusion) -Monitor CBC, renal fx, cultures and clinical progress -VT at SS  Height: 5\' 1"  (154.9 cm) Weight: 133 lb (60.3 kg) IBW/kg (Calculated) : 47.8  Temp (24hrs), Avg:101.9 F (38.8 C), Min:100.7 F (38.2 C), Max:103.1 F (39.5 C)  Recent Labs  Lab 12/23/17 2036 12/23/17 2052  WBC 4.7  --   CREATININE 0.90  --   LATICACIDVEN  --  4.83*    Estimated Creatinine Clearance: 64.4 mL/min (by C-G formula based on SCr of 0.9 mg/dL).    Allergies  Allergen Reactions  . Iohexol Hives, Itching and Swelling  . Morphine And Related Itching and Other (See Comments)    Can tolerate with Benadryl    Antimicrobials this admission: Vanc 3/6 >>   Dose adjustments this admission:   Microbiology results:   Thank you for allowing pharmacy to be a part of this patient's care.  Albertina Parr, PharmD., BCPS Clinical Pharmacist Clinical phone for 12/23/17 until 11pm: [phone] If after 11pm, please call main pharmacy at: [phone]

## 2017-12-23 NOTE — ED Notes (Signed)
Patient requesting pain medication, aware of BP and it's impact on meds.  MD aware of all

## 2017-12-23 NOTE — ED Notes (Signed)
Told to hold on vanc order for now by pharmacy

## 2017-12-23 NOTE — Telephone Encounter (Signed)
Nurse called patient with results. See result note.

## 2017-12-23 NOTE — ED Notes (Signed)
Patient aware of need for stool and urine sample

## 2017-12-23 NOTE — ED Triage Notes (Signed)
Pt states she just finished a fecal transplant last week, now she have persistent fever, chills, burning sensation and orange color on her urine.
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
## 2017-12-23 NOTE — ED Provider Notes (Signed)
Patient placed in Quick Look pathway, seen and evaluated   Chief Complaint: abdominal pain  HPI:   47 y.o. female with long history of C-diff here tonight with abdominal pain, diarrhea and fever. Patient finished a fecal transplant last week. Patient reports fever up to 104 and burning sensation with urination and orange color to urine.   ROS: General: Fever  GI: diarrhea, abdominal pain  Physical Exam:   Gen: appears uncomfortable febrile  Neuro: Awake and Alert  Abdomen: tender    Focused Exam:    Initiation of care has begun. The patient has been counseled on the process, plan, and necessity for staying for the completion/evaluation, and the remainder of the medical screening examination    Ashley Murrain, NP 12/23/17 2033

## 2017-12-24 ENCOUNTER — Emergency Department (HOSPITAL_COMMUNITY): Payer: Medicaid Other

## 2017-12-24 ENCOUNTER — Encounter (HOSPITAL_COMMUNITY): Payer: Self-pay | Admitting: General Practice

## 2017-12-24 DIAGNOSIS — Z79890 Hormone replacement therapy (postmenopausal): Secondary | ICD-10-CM | POA: Diagnosis not present

## 2017-12-24 DIAGNOSIS — Z9049 Acquired absence of other specified parts of digestive tract: Secondary | ICD-10-CM | POA: Diagnosis not present

## 2017-12-24 DIAGNOSIS — F17210 Nicotine dependence, cigarettes, uncomplicated: Secondary | ICD-10-CM | POA: Diagnosis present

## 2017-12-24 DIAGNOSIS — F329 Major depressive disorder, single episode, unspecified: Secondary | ICD-10-CM

## 2017-12-24 DIAGNOSIS — E871 Hypo-osmolality and hyponatremia: Secondary | ICD-10-CM | POA: Diagnosis present

## 2017-12-24 DIAGNOSIS — R6521 Severe sepsis with septic shock: Secondary | ICD-10-CM | POA: Diagnosis present

## 2017-12-24 DIAGNOSIS — Z720 Tobacco use: Secondary | ICD-10-CM

## 2017-12-24 DIAGNOSIS — F1010 Alcohol abuse, uncomplicated: Secondary | ICD-10-CM

## 2017-12-24 DIAGNOSIS — Z8249 Family history of ischemic heart disease and other diseases of the circulatory system: Secondary | ICD-10-CM | POA: Diagnosis not present

## 2017-12-24 DIAGNOSIS — R197 Diarrhea, unspecified: Secondary | ICD-10-CM

## 2017-12-24 DIAGNOSIS — F419 Anxiety disorder, unspecified: Secondary | ICD-10-CM

## 2017-12-24 DIAGNOSIS — E039 Hypothyroidism, unspecified: Secondary | ICD-10-CM

## 2017-12-24 DIAGNOSIS — G9340 Encephalopathy, unspecified: Secondary | ICD-10-CM | POA: Diagnosis present

## 2017-12-24 DIAGNOSIS — K219 Gastro-esophageal reflux disease without esophagitis: Secondary | ICD-10-CM

## 2017-12-24 DIAGNOSIS — R339 Retention of urine, unspecified: Secondary | ICD-10-CM

## 2017-12-24 DIAGNOSIS — D689 Coagulation defect, unspecified: Secondary | ICD-10-CM | POA: Diagnosis present

## 2017-12-24 DIAGNOSIS — A0472 Enterocolitis due to Clostridium difficile, not specified as recurrent: Secondary | ICD-10-CM

## 2017-12-24 DIAGNOSIS — D539 Nutritional anemia, unspecified: Secondary | ICD-10-CM | POA: Diagnosis present

## 2017-12-24 DIAGNOSIS — N390 Urinary tract infection, site not specified: Secondary | ICD-10-CM | POA: Diagnosis present

## 2017-12-24 DIAGNOSIS — D649 Anemia, unspecified: Secondary | ICD-10-CM

## 2017-12-24 DIAGNOSIS — R1030 Lower abdominal pain, unspecified: Secondary | ICD-10-CM

## 2017-12-24 DIAGNOSIS — I472 Ventricular tachycardia: Secondary | ICD-10-CM | POA: Diagnosis not present

## 2017-12-24 DIAGNOSIS — R509 Fever, unspecified: Secondary | ICD-10-CM | POA: Diagnosis present

## 2017-12-24 DIAGNOSIS — A419 Sepsis, unspecified organism: Secondary | ICD-10-CM | POA: Diagnosis present

## 2017-12-24 DIAGNOSIS — N179 Acute kidney failure, unspecified: Secondary | ICD-10-CM | POA: Diagnosis present

## 2017-12-24 DIAGNOSIS — D6959 Other secondary thrombocytopenia: Secondary | ICD-10-CM | POA: Diagnosis present

## 2017-12-24 DIAGNOSIS — K7031 Alcoholic cirrhosis of liver with ascites: Secondary | ICD-10-CM | POA: Diagnosis present

## 2017-12-24 DIAGNOSIS — Z79899 Other long term (current) drug therapy: Secondary | ICD-10-CM | POA: Diagnosis not present

## 2017-12-24 DIAGNOSIS — D696 Thrombocytopenia, unspecified: Secondary | ICD-10-CM | POA: Diagnosis present

## 2017-12-24 DIAGNOSIS — G629 Polyneuropathy, unspecified: Secondary | ICD-10-CM | POA: Diagnosis present

## 2017-12-24 DIAGNOSIS — E872 Acidosis: Secondary | ICD-10-CM | POA: Diagnosis present

## 2017-12-24 DIAGNOSIS — D61818 Other pancytopenia: Secondary | ICD-10-CM | POA: Diagnosis not present

## 2017-12-24 DIAGNOSIS — K7030 Alcoholic cirrhosis of liver without ascites: Secondary | ICD-10-CM

## 2017-12-24 DIAGNOSIS — K921 Melena: Secondary | ICD-10-CM | POA: Diagnosis not present

## 2017-12-24 LAB — RESPIRATORY PANEL BY PCR
ADENOVIRUS-RVPPCR: NOT DETECTED
Bordetella pertussis: NOT DETECTED
CORONAVIRUS NL63-RVPPCR: NOT DETECTED
Chlamydophila pneumoniae: NOT DETECTED
Coronavirus 229E: NOT DETECTED
Coronavirus HKU1: NOT DETECTED
Coronavirus OC43: NOT DETECTED
Influenza A: NOT DETECTED
Influenza B: NOT DETECTED
METAPNEUMOVIRUS-RVPPCR: NOT DETECTED
Mycoplasma pneumoniae: NOT DETECTED
PARAINFLUENZA VIRUS 2-RVPPCR: NOT DETECTED
PARAINFLUENZA VIRUS 3-RVPPCR: NOT DETECTED
Parainfluenza Virus 1: NOT DETECTED
Parainfluenza Virus 4: NOT DETECTED
RHINOVIRUS / ENTEROVIRUS - RVPPCR: NOT DETECTED
Respiratory Syncytial Virus: NOT DETECTED

## 2017-12-24 LAB — CBC
HCT: 19.9 % — ABNORMAL LOW (ref 36.0–46.0)
HEMATOCRIT: 20.4 % — AB (ref 36.0–46.0)
HEMOGLOBIN: 6.7 g/dL — AB (ref 12.0–15.0)
HEMOGLOBIN: 6.6 g/dL — AB (ref 12.0–15.0)
MCH: 35.1 pg — ABNORMAL HIGH (ref 26.0–34.0)
MCH: 35.3 pg — AB (ref 26.0–34.0)
MCHC: 32.8 g/dL (ref 30.0–36.0)
MCHC: 33.2 g/dL (ref 30.0–36.0)
MCV: 106.8 fL — ABNORMAL HIGH (ref 78.0–100.0)
MCV: 106.4 fL — ABNORMAL HIGH (ref 78.0–100.0)
PLATELETS: 60 10*3/uL — AB (ref 150–400)
Platelets: 59 10*3/uL — ABNORMAL LOW (ref 150–400)
RBC: 1.91 MIL/uL — AB (ref 3.87–5.11)
RBC: 1.87 MIL/uL — AB (ref 3.87–5.11)
WBC: 3.3 10*3/uL — AB (ref 4.0–10.5)
WBC: 4.6 10*3/uL (ref 4.0–10.5)

## 2017-12-24 LAB — PATHOLOGIST SMEAR REVIEW

## 2017-12-24 LAB — COMPREHENSIVE METABOLIC PANEL
ALT: 19 U/L (ref 14–54)
AST: 44 U/L — AB (ref 15–41)
Albumin: 2.8 g/dL — ABNORMAL LOW (ref 3.5–5.0)
Alkaline Phosphatase: 117 U/L (ref 38–126)
Anion gap: 10 (ref 5–15)
BUN: 5 mg/dL — AB (ref 6–20)
CO2: 22 mmol/L (ref 22–32)
CREATININE: 0.87 mg/dL (ref 0.44–1.00)
Calcium: 7.9 mg/dL — ABNORMAL LOW (ref 8.9–10.3)
Chloride: 107 mmol/L (ref 101–111)
GFR calc Af Amer: >60 mL/min (ref >60)
Glucose, Bld: 89 mg/dL (ref 65–99)
Potassium: 3.5 mmol/L (ref 3.5–5.1)
Sodium: 139 mmol/L (ref 135–145)
TOTAL PROTEIN: 6.9 g/dL (ref 6.5–8.1)
Total Bilirubin: 8.6 mg/dL — ABNORMAL HIGH (ref 0.3–1.2)

## 2017-12-24 LAB — URINALYSIS, ROUTINE W REFLEX MICROSCOPIC
Bilirubin Urine: NEGATIVE
Glucose, UA: NEGATIVE mg/dL
Ketones, ur: NEGATIVE mg/dL
Leukocytes, UA: NEGATIVE
Nitrite: NEGATIVE
PH: 6 (ref 5.0–8.0)
Protein, ur: NEGATIVE mg/dL
RBC / HPF: NONE SEEN RBC/hpf (ref 0–5)
SPECIFIC GRAVITY, URINE: 1.005 (ref 1.005–1.030)
SQUAMOUS EPITHELIAL / LPF: NONE SEEN

## 2017-12-24 LAB — AMMONIA: Ammonia: 24 umol/L (ref 9–35)

## 2017-12-24 LAB — VITAMIN B12: VITAMIN B 12: 758 pg/mL (ref 180–914)

## 2017-12-24 LAB — PROTIME-INR
INR: 1.84
Prothrombin Time: 21.1 seconds — ABNORMAL HIGH (ref 11.4–15.2)

## 2017-12-24 LAB — LACTIC ACID, PLASMA: Lactic Acid, Venous: 1.5 mmol/L (ref 0.5–1.9)

## 2017-12-24 LAB — C DIFFICILE QUICK SCREEN W PCR REFLEX
C Diff antigen: NEGATIVE
C Diff interpretation: NOT DETECTED
C Diff toxin: NEGATIVE

## 2017-12-24 LAB — HEMOGLOBIN AND HEMATOCRIT, BLOOD
HCT: 19.6 % — ABNORMAL LOW (ref 36.0–46.0)
Hemoglobin: 6.5 g/dL — CL (ref 12.0–15.0)

## 2017-12-24 LAB — BILIRUBIN, FRACTIONATED(TOT/DIR/INDIR)
Bilirubin, Direct: 2 mg/dL — ABNORMAL HIGH (ref 0.1–0.5)
Indirect Bilirubin: 6.2 mg/dL — ABNORMAL HIGH (ref 0.3–0.9)
Total Bilirubin: 8.2 mg/dL — ABNORMAL HIGH (ref 0.3–1.2)

## 2017-12-24 LAB — PREPARE RBC (CROSSMATCH)

## 2017-12-24 LAB — CBG MONITORING, ED: GLUCOSE-CAPILLARY: 72 mg/dL (ref 65–99)

## 2017-12-24 LAB — PROCALCITONIN: Procalcitonin: 0.24 ng/mL

## 2017-12-24 MED ORDER — HYDROXYZINE HCL 10 MG PO TABS
10.0000 mg | ORAL_TABLET | Freq: Two times a day (BID) | ORAL | Status: DC
  Administered 2017-12-24 – 2018-01-01 (×18): 10 mg via ORAL
  Filled 2017-12-24 (×19): qty 1

## 2017-12-24 MED ORDER — ZOLPIDEM TARTRATE 5 MG PO TABS
5.0000 mg | ORAL_TABLET | Freq: Every evening | ORAL | Status: DC | PRN
  Administered 2017-12-30 – 2018-01-07 (×6): 5 mg via ORAL
  Filled 2017-12-24 (×6): qty 1

## 2017-12-24 MED ORDER — ACETAMINOPHEN 325 MG PO TABS
650.0000 mg | ORAL_TABLET | Freq: Once | ORAL | Status: AC
  Administered 2017-12-24: 650 mg via ORAL
  Filled 2017-12-24: qty 2

## 2017-12-24 MED ORDER — PROMETHAZINE HCL 25 MG PO TABS
25.0000 mg | ORAL_TABLET | Freq: Three times a day (TID) | ORAL | Status: DC | PRN
Start: 2017-12-24 — End: 2017-12-29
  Administered 2017-12-24: 25 mg via ORAL
  Filled 2017-12-24: qty 1

## 2017-12-24 MED ORDER — LEVOTHYROXINE SODIUM 75 MCG PO TABS
75.0000 ug | ORAL_TABLET | Freq: Every day | ORAL | Status: DC
  Administered 2017-12-25 – 2018-01-08 (×15): 75 ug via ORAL
  Filled 2017-12-24 (×5): qty 1
  Filled 2017-12-24: qty 3
  Filled 2017-12-24 (×9): qty 1

## 2017-12-24 MED ORDER — PANTOPRAZOLE SODIUM 40 MG IV SOLR: 40.0000 mg | INTRAVENOUS | Status: DC

## 2017-12-24 MED ORDER — THIAMINE HCL 100 MG/ML IJ SOLN
100.0000 mg | Freq: Every day | INTRAMUSCULAR | Status: DC
  Administered 2017-12-24: 100 mg via INTRAVENOUS
  Filled 2017-12-24: qty 2

## 2017-12-24 MED ORDER — LORAZEPAM 2 MG/ML IJ SOLN: 1.0000 mg | Freq: Four times a day (QID) | INTRAMUSCULAR | Status: AC | PRN

## 2017-12-24 MED ORDER — COLESTIPOL HCL 1 G PO TABS
1.0000 g | ORAL_TABLET | Freq: Two times a day (BID) | ORAL | Status: DC
  Administered 2017-12-24 – 2018-01-08 (×31): 1 g via ORAL
  Filled 2017-12-24 (×32): qty 1

## 2017-12-24 MED ORDER — LORAZEPAM 2 MG/ML IJ SOLN: 0.0000 mg | Freq: Two times a day (BID) | INTRAMUSCULAR | Status: AC

## 2017-12-24 MED ORDER — NICOTINE 21 MG/24HR TD PT24
21.0000 mg | MEDICATED_PATCH | Freq: Every day | TRANSDERMAL | Status: DC
  Administered 2017-12-25 – 2018-01-08 (×15): 21 mg via TRANSDERMAL
  Filled 2017-12-24 (×15): qty 1

## 2017-12-24 MED ORDER — SODIUM CHLORIDE 0.9 % IV SOLN
Freq: Once | INTRAVENOUS | Status: AC
  Administered 2017-12-24: 17:00:00 via INTRAVENOUS

## 2017-12-24 MED ORDER — DIPHENHYDRAMINE HCL 50 MG/ML IJ SOLN
12.5000 mg | Freq: Three times a day (TID) | INTRAMUSCULAR | Status: DC | PRN
  Administered 2017-12-24: 12.5 mg via INTRAVENOUS

## 2017-12-24 MED ORDER — PREGABALIN 25 MG PO CAPS
50.0000 mg | ORAL_CAPSULE | Freq: Three times a day (TID) | ORAL | Status: DC
  Administered 2017-12-24 – 2017-12-27 (×12): 50 mg via ORAL
  Filled 2017-12-24 (×12): qty 2

## 2017-12-24 MED ORDER — NOREPINEPHRINE BITARTRATE 1 MG/ML IV SOLN
0.0000 ug/min | INTRAVENOUS | Status: DC
  Administered 2017-12-24: 2 ug/min via INTRAVENOUS
  Filled 2017-12-24: qty 4

## 2017-12-24 MED ORDER — MORPHINE SULFATE (PF) 4 MG/ML IV SOLN
1.0000 mg | INTRAVENOUS | Status: DC | PRN
  Administered 2017-12-24 – 2017-12-29 (×24): 1 mg via INTRAVENOUS
  Filled 2017-12-24 (×26): qty 1

## 2017-12-24 MED ORDER — LOPERAMIDE HCL 2 MG PO CAPS
4.0000 mg | ORAL_CAPSULE | Freq: Once | ORAL | Status: AC
  Administered 2017-12-24: 4 mg via ORAL
  Filled 2017-12-24: qty 2

## 2017-12-24 MED ORDER — PANTOPRAZOLE SODIUM 40 MG IV SOLR: 40.0000 mg | Freq: Two times a day (BID) | INTRAVENOUS | Status: DC

## 2017-12-24 MED ORDER — PANCRELIPASE (LIP-PROT-AMYL) 12000-38000 UNITS PO CPEP
72000.0000 [IU] | ORAL_CAPSULE | Freq: Three times a day (TID) | ORAL | Status: DC
  Administered 2017-12-24 – 2018-01-08 (×45): 72000 [IU] via ORAL
  Filled 2017-12-24 (×6): qty 6
  Filled 2017-12-24: qty 2
  Filled 2017-12-24 (×3): qty 6
  Filled 2017-12-24: qty 2
  Filled 2017-12-24 (×35): qty 6

## 2017-12-24 MED ORDER — VITAMIN B-1 100 MG PO TABS
100.0000 mg | ORAL_TABLET | Freq: Every day | ORAL | Status: DC
  Administered 2017-12-25 – 2018-01-08 (×15): 100 mg via ORAL
  Filled 2017-12-24 (×16): qty 1

## 2017-12-24 MED ORDER — DICYCLOMINE HCL 10 MG PO CAPS
10.0000 mg | ORAL_CAPSULE | Freq: Three times a day (TID) | ORAL | Status: DC | PRN
  Administered 2017-12-24: 10 mg via ORAL
  Filled 2017-12-24: qty 1

## 2017-12-24 MED ORDER — LORAZEPAM 1 MG PO TABS: 1.0000 mg | ORAL_TABLET | Freq: Four times a day (QID) | ORAL | Status: AC | PRN

## 2017-12-24 MED ORDER — ADULT MULTIVITAMIN W/MINERALS CH
1.0000 | ORAL_TABLET | Freq: Every day | ORAL | Status: DC
  Administered 2017-12-24 – 2018-01-08 (×16): 1 via ORAL
  Filled 2017-12-24 (×16): qty 1

## 2017-12-24 MED ORDER — PANTOPRAZOLE SODIUM 40 MG PO TBEC
40.0000 mg | DELAYED_RELEASE_TABLET | Freq: Every day | ORAL | Status: DC
  Administered 2017-12-24 – 2018-01-02 (×10): 40 mg via ORAL
  Filled 2017-12-24 (×10): qty 1

## 2017-12-24 MED ORDER — IBUPROFEN 400 MG PO TABS
400.0000 mg | ORAL_TABLET | Freq: Four times a day (QID) | ORAL | Status: DC | PRN
  Administered 2017-12-24 (×2): 400 mg via ORAL
  Filled 2017-12-24 (×2): qty 1

## 2017-12-24 MED ORDER — LORAZEPAM 2 MG/ML IJ SOLN
0.0000 mg | Freq: Four times a day (QID) | INTRAMUSCULAR | Status: AC
  Administered 2017-12-24: 2 mg via INTRAVENOUS
  Administered 2017-12-24: 1 mg via INTRAVENOUS
  Administered 2017-12-24: 2 mg via INTRAVENOUS
  Filled 2017-12-24 (×3): qty 1

## 2017-12-24 MED ORDER — FOLIC ACID 1 MG PO TABS
1.0000 mg | ORAL_TABLET | Freq: Every day | ORAL | Status: DC
  Administered 2017-12-24 – 2018-01-08 (×16): 1 mg via ORAL
  Filled 2017-12-24 (×16): qty 1

## 2017-12-24 NOTE — ED Notes (Signed)
2 UNITS READY 

## 2017-12-24 NOTE — ED Notes (Signed)
Iv blood ioncreased to 215ml/hr

## 2017-12-24 NOTE — ED Notes (Signed)
Patient had BM scant amount light brown. Assisted in rolling side to side without incident.

## 2017-12-24 NOTE — ED Notes (Signed)
Blood drip increased to 271ml/hr no reaction to first blood 15 minute of blood

## 2017-12-24 NOTE — ED Notes (Signed)
Pt receiving a blood transfusion at this time.

## 2017-12-24 NOTE — ED Notes (Signed)
Admitting MD at bedside, temp 101.3. No orders at this time, she had PRN ibuprofen at 0912.

## 2017-12-24 NOTE — ED Notes (Signed)
PT WENT TO C-T AND RETURNED C/O ABD PAIN DIARRHEA NOW  BLOOD STILL INFUSING

## 2017-12-24 NOTE — Progress Notes (Signed)
Report received from the ED. Pt. Arrived to the unit at 1600 via bed.

## 2017-12-24 NOTE — Progress Notes (Signed)
PROGRESS NOTE    Jamie Allen  WHQ:[id number] DOB: [birth year] DOA: 12/23/2017 PCP: Ladell Pier, MD   Brief Narrative: Jamie Allen is a 47 y.o. 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 She presents with fevers/chills/nausea and vomiting concerning for sepsis with shock.   Assessment & Plan:   Principal Problem:   Septic shock ([place]) Active Problems:   Anxiety and depression   Hypothyroidism   GERD (gastroesophageal reflux disease)   Abdominal pain   C. difficile enteritis   Alcoholic cirrhosis of liver (HCC)   Tobacco abuse   Macrocytic anemia   Thrombocytopenia (HCC)   Alcohol abuse   Septic shock Unknown source but likely GI. Initially required Levophed which was weaned off. Evaluated by PCCM on admission. No ascites on CT.  abdomen.  History of C. Difficile enteritis C. Diff negative.  Alcoholic cirrhosis MELD score of 21. Patient reports last drink was a week ago. AST/ALT stable. Bilirubin significantly elevated. Ammonia wnl. -GI recommendations  Alcohol abuse ?withdrawal. -CIWA  Macrocytic anemia Recent rectal bleeding. S/p 2 units PRBC. -folate/B12 pending  Tobacco abuse -Continue nicotine patch  Thrombocytopenia Secondary to cirrhosis. Stable.  GERD -Continue Protonix  Hypothyroidism -Continue Synthroid  Depression/Anxiety Not currently on medications   DVT prophylaxis: SCDs Code Status:   Code Status: Full Code Family Communication: None at bedside Disposition Plan: TBD   Consultants:   [facility]  Procedures:   2 units PRBC  Antimicrobials:  Vancomycin  Zosyn    Subjective: Febrile overnight. No vomiting this morning.  Objective: Vitals:   12/24/17 1200 12/24/17 1300 12/24/17 1320 12/24/17 1340  BP:  (!) 94/47 (!) 88/46 (!) 96/53 (!) 95/44  Pulse: 95 84 88 93  Resp: (!) 28 (!) 27 (!) 26 (!) 28  Temp:      TempSrc:      SpO2: 95% 94% 96% 96%  Weight:      Height:        Intake/Output Summary (Last 24 hours) at 12/24/2017 1357 Last data filed at 12/24/2017 1001 Gross per 24 hour  Intake 680 ml  Output 1400 ml  Net -720 ml   Filed Weights   12/23/17 2019  Weight: 60.3 kg (133 lb)    Examination:  General exam: Appears calm and comfortable HEENT: scleral icterus Respiratory system: Clear to auscultation. Respiratory effort normal. Cardiovascular system: S1 & S2 heard, RRR. No murmurs, rubs, gallops or clicks. Gastrointestinal system: Abdomen is distended, soft and nontender. Normal bowel sounds heard. Central nervous system: Alert and oriented. No focal neurological deficits. Extremities: No edema. No calf tenderness Skin: No cyanosis. No rashes Psychiatry: Judgement and insight appear normal. Mood & affect appropriate.     Data Reviewed: I have personally reviewed following labs and imaging studies  CBC: Recent Labs  Lab 12/23/17 2036 12/23/17 2253 12/24/17 0946  WBC 4.7  --  3.3*  NEUTROABS 3.9  --   --   HGB 5.4*  --  6.7*  HCT 15.8*  --  20.4*  MCV 124.4*  --  106.8*  PLT 65* 61* 59*   Basic Metabolic Panel: Recent Labs  Lab 12/23/17 2036 12/24/17 0638  NA 132* 139  K 3.8 3.5  CL 98* 107  CO2 20* 22  GLUCOSE 136* 89  BUN <5* 5*  CREATININE 0.90 0.87  CALCIUM 8.8* 7.9*  GFR: Estimated Creatinine Clearance: 66.6 mL/min (by C-G formula based on SCr of 0.87 mg/dL). Liver Function Tests: Recent Labs  Lab 12/23/17 2036 12/24/17 0638 12/24/17 0946  AST 49* 44*  --   ALT 19 19  --   ALKPHOS 135* 117  --   BILITOT 5.5* 8.6* 8.2*  PROT 7.6 6.9  --   ALBUMIN 3.0* 2.8*  --    Recent Labs  Lab 12/23/17 2036  LIPASE 33   Recent Labs  Lab 12/23/17 2320  AMMONIA 24   Coagulation Profile: Recent Labs  Lab 12/23/17 2036 12/23/17 2253  12/24/17 0638  INR 1.64 1.80 1.84   Cardiac Enzymes: No results for input(s): CKTOTAL, CKMB, CKMBINDEX, TROPONINI in the last 168 hours. BNP (last 3 results) No results for input(s): PROBNP in the last 8760 hours. HbA1C: No results for input(s): HGBA1C in the last 72 hours. CBG: Recent Labs  Lab 12/24/17 0753  GLUCAP 72   Lipid Profile: No results for input(s): CHOL, HDL, LDLCALC, TRIG, CHOLHDL, LDLDIRECT in the last 72 hours. Thyroid Function Tests: No results for input(s): TSH, T4TOTAL, FREET4, T3FREE, THYROIDAB in the last 72 hours. Anemia Panel: No results for input(s): VITAMINB12, FOLATE, FERRITIN, TIBC, IRON, RETICCTPCT in the last 72 hours. Sepsis Labs: Recent Labs  Lab 12/23/17 2052 12/23/17 2216 12/24/17 0638  PROCALCITON  --   --  0.24  LATICACIDVEN 4.83* 3.79* 1.5    Recent Results (from the past 240 hour(s))  Blood culture (routine x 2)     Status: None (Preliminary result)   Collection Time: 12/23/17  7:50 PM  Result Value Ref Range Status   Specimen Description BLOOD RIGHT HAND  Final   Special Requests   Final    IN PEDIATRIC BOTTLE Blood Culture results may not be optimal due to an excessive volume of blood received in culture bottles   Culture   Final    NO GROWTH < 12 HOURS Performed at [facility] [address] [place], [place] [address]    Report Status PENDING  Incomplete  Blood culture (routine x 2)     Status: None (Preliminary result)   Collection Time: 12/23/17  7:50 PM  Result Value Ref Range Status   Specimen Description BLOOD LEFT ANTECUBITAL  Final   Special Requests   Final    BOTTLES DRAWN AEROBIC AND ANAEROBIC Blood Culture adequate volume   Culture   Final    NO GROWTH < 12 HOURS Performed at [facility], [place] [address], [place], [place] [address]    Report Status PENDING  Incomplete  C difficile quick scan w PCR reflex     Status: None   Collection Time: 12/24/17  2:53 AM  Result Value Ref Range Status   C  Diff antigen NEGATIVE NEGATIVE Final   C Diff toxin NEGATIVE NEGATIVE Final   C Diff interpretation No C. difficile detected.  Final    Comment: Performed at [facility], [place] [address], [place], [place] [address]  Respiratory Panel by PCR     Status: None   Collection Time: 12/24/17  6:20 AM  Result Value Ref Range Status   Adenovirus NOT DETECTED NOT DETECTED Final   Coronavirus 229E NOT DETECTED NOT DETECTED Final   Coronavirus HKU1 NOT DETECTED NOT DETECTED Final   Coronavirus NL63 NOT DETECTED NOT DETECTED Final   Coronavirus OC43 NOT DETECTED NOT DETECTED Final   Metapneumovirus NOT DETECTED NOT DETECTED Final   Rhinovirus / Enterovirus  NOT DETECTED NOT DETECTED Final   Influenza A NOT DETECTED NOT DETECTED Final   Influenza B NOT DETECTED NOT DETECTED Final   Parainfluenza Virus 1 NOT DETECTED NOT DETECTED Final   Parainfluenza Virus 2 NOT DETECTED NOT DETECTED Final   Parainfluenza Virus 3 NOT DETECTED NOT DETECTED Final   Parainfluenza Virus 4 NOT DETECTED NOT DETECTED Final   Respiratory Syncytial Virus NOT DETECTED NOT DETECTED Final   Bordetella pertussis NOT DETECTED NOT DETECTED Final   Chlamydophila pneumoniae NOT DETECTED NOT DETECTED Final   Mycoplasma pneumoniae NOT DETECTED NOT DETECTED Final         Radiology Studies: Ct Abdomen Pelvis Wo Contrast  Result Date: 12/24/2017 CLINICAL DATA:  Status post fecal transplant with fever and chills EXAM: CT ABDOMEN AND PELVIS WITHOUT CONTRAST TECHNIQUE: Multidetector CT imaging of the abdomen and pelvis was performed following the standard protocol without IV contrast. COMPARISON:  09/25/2017, 09/20/2017, 06/28/2017, 04/28/2017, and multiple prior CT abdomen pelvis. FINDINGS: Lower chest: Visible lung bases demonstrate patchy dependent atelectasis. No acute consolidation or pleural effusion. Normal heart size. Hepatobiliary: Cirrhotic appearing liver with nodular contour. Enlarged caudate lobe. Surgical clips in the  gallbladder fossa. No biliary dilatation. Pancreas: Unremarkable. No pancreatic ductal dilatation or surrounding inflammatory changes. Spleen: Enlarged Adrenals/Urinary Tract: Adrenal glands are within normal limits. Mildly atrophic left kidney with cortical scarring present at the mid and upper pole. No hydronephrosis. The bladder is normal Stomach/Bowel: The stomach is nonenlarged. No dilated small bowel. No significant colon wall thickening. Normal appendix. Vascular/Lymphatic: Mild aortic atherosclerosis. No aneurysmal dilatation. No significantly enlarged lymph nodes. Reproductive: Uterus and bilateral adnexa are unremarkable. Other: Negative for free air or free fluid. Stable mild soft tissue stranding/edema at the root of the mesentery. Stable minimal edema and soft tissue thickening in the right gutter. Musculoskeletal: No acute or significant osseous findings. IMPRESSION: 1. No acute interval changes since comparison CT. Mild soft tissue thickening and edema in the right gutter 2. Morphologic changes consistent with cirrhosis.  Splenomegaly. 3. Mildly atrophic left kidney with scarring. Electronically Signed   By: Donavan Foil M.D.   On: 12/24/2017 02:23   Dg Chest 2 View  Result Date: 12/23/2017 CLINICAL DATA:  Cough congestion and fever EXAM: CHEST - 2 VIEW COMPARISON:  09/20/2017 FINDINGS: The heart size and mediastinal contours are within normal limits. Linear scarring or atelectasis at the left base. Normal heart size. No pneumothorax. IMPRESSION: No active cardiopulmonary disease. Linear scarring or atelectasis at the left lung base. Electronically Signed   By: Donavan Foil M.D.   On: 12/23/2017 21:10        Scheduled Meds: . colestipol  1 g Oral BID  . folic acid  1 mg Oral Daily  . hydrOXYzine  10 mg Oral BID  . levothyroxine  75 mcg Oral QAC breakfast  . lipase/protease/amylase  72,000 Units Oral TID WC  . LORazepam  0-4 mg Intravenous Q6H   Followed by  . [START ON 12/26/2017]  LORazepam  0-4 mg Intravenous Q12H  . multivitamin with minerals  1 tablet Oral Daily  . nicotine  21 mg Transdermal Daily  . pantoprazole  40 mg Oral Q0600  . pregabalin  50 mg Oral TID  . thiamine  100 mg Oral Daily   Or  . thiamine  100 mg Intravenous Daily   Continuous Infusions: . sodium chloride    . piperacillin-tazobactam (ZOSYN)  IV Stopped (12/24/17 1001)  . vancomycin Stopped (12/24/17 1245)     LOS:  0 days     Cordelia Poche, MD [facility] 12/24/2017, 1:57 PM Pager: [phone]  If 7PM-7AM, please contact night-coverage [email or web address] Password [email or web address] 12/24/2017, 1:57 PM

## 2017-12-24 NOTE — ED Notes (Addendum)
Paged admitting regarding temp 100.1 (no PRN orders for fever, teeth chattering), asking for something to drink or diet order, CBG 72. Admitting will address both needs.
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
## 2017-12-24 NOTE — Progress Notes (Signed)
Jamie Allen is a 47 y.o. female patient admitted from ED awake, alert - oriented  X 4 - no acute distress noted.  VSS - Blood pressure (!) 121/58, pulse (!) 104, temperature 99.2 F (37.3 C), temperature source Oral, resp. rate (!) 29, height 5\' 1"  (1.549 m), weight 63.9 kg (140 lb 14 oz), last menstrual period 06/01/2014, SpO2 100 %.    IV in place, occlusive dsg intact without redness.  Orientation to room, and floor completed with information packet given to patient/family.  Patient declined safety video at this time.  Admission INP armband ID verified with patient/family, and in place.   SR up x 2, fall assessment complete, with patient and family able to verbalize understanding of risk associated with falls, and verbalized understanding to call nsg before up out of bed.  Call light within reach, patient able to voice, and demonstrate understanding.  Skin, clean-dry- intact without evidence of bruising, or skin tears.   No evidence of skin break down noted on exam.     Will cont to eval and treat per MD orders.  Dorris Carnes, RN 12/24/2017 7:49 PM

## 2017-12-24 NOTE — ED Notes (Signed)
The pt continues to have loose stools almost every 15-20 minutes .
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
## 2017-12-24 NOTE — ED Notes (Signed)
Sleeping at present.

## 2017-12-24 NOTE — Consult Note (Signed)
PULMONARY / CRITICAL CARE MEDICINE   Name: Tinzlee Craker MRN: [id number] DOB: 1971/03/15    ADMISSION DATE:  12/23/2017 CONSULTATION DATE:  12/23/17  REFERRING MD:  ER MD  CHIEF COMPLAINT: Sepsis, Abdominal pain  HISTORY OF PRESENT ILLNESS:  46yoF with Cirrhosis (unclear if NASH vs Alcoholic), GERD, HTN, Anemia, and Recurrent Cdiff, who presents to the ER today c/o Fever x several days and with N/V x 1 day. 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 She says that by 2 weeks following the transplant, she no longer had any diarrhea. However for the past several days she has had more diarrhea again. She says the diarrhea is nonbloody but one time she stood up and noticed the toilet bowl was full of blood. She could not describe if it was just the water that was bloody or if the stool itself was bloody. She says she assumed it was her hemorrhoids but is not sure. She also reports feeling that she needs to urinate but being unable to. She says when she does urinate she has dysuria. In the ER she had a temp of 104 and was noted to have BP 81/41 initially (with baseline BP ranging 96/63 to 110's/60's). He was also found to be anemic with Hgb 5.4 down from baseline of 9.3. She is being transfused 1u pRBC currently.   PAST MEDICAL HISTORY :  She  has a past medical history of Alcoholic cirrhosis of liver ([place]) (09/22/2017), Allergy, Anemia, Ankle fracture (09/23/2015), Ankle fracture, left, Antral gastritis (2015), Anxiety, Bimalleolar fracture of right ankle (10/06/2015), C. difficile diarrhea (02/02/2014), Chronic cholecystitis with calculus s/p lap cholecystectomy 12/25/2016 (12/24/2016), Colitis (01-03-14), Drug-seeking behavior, Foot fracture (10/06/2015), GERD (gastroesophageal reflux disease), Headache(784.0), Hemorrhage (01-03-14), Hypertension, Immune deficiency disorder ([place]), Nonalcoholic steatohepatitis ([facility]), Peripheral neuropathy, and Post-traumatic stress (01/03/2014).  PAST  SURGICAL HISTORY: She  has a past surgical history that includes Tonsillectomy; Flexible sigmoidoscopy (N/A, 12/17/2013); Esophagogastroduodenoscopy (N/A, 02/06/2014); Colonoscopy with propofol (N/A, 01/18/2014); ORIF ankle fracture (Right, 10/07/2015); Cholecystectomy (N/A, 12/25/2016); Upper gastrointestinal endoscopy; Colonoscopy; Polypectomy; Colonoscopy (N/A, 05/01/2017); Colonoscopy with propofol (N/A, 11/11/2017); and Fecal transplant (N/A, 11/11/2017).  Allergies  Allergen Reactions  . Iohexol Hives, Itching and Swelling  . Morphine And Related Itching and Other (See Comments)    Can tolerate with Benadryl    Current Facility-Administered Medications on File Prior to Encounter  Medication  . loperamide (IMODIUM) capsule 4 mg   Current Outpatient Medications on File Prior to Encounter  Medication Sig  . dicyclomine (BENTYL) 10 MG capsule Take 1 capsule (10 mg total) by mouth 3 (three) times daily before meals. (Patient taking differently: Take 10 mg by mouth every 8 (eight) hours as needed for spasms. )  . esomeprazole (NEXIUM) 20 MG capsule Take 20 mg by mouth daily at 12 noon.  . hydrOXYzine (ATARAX/VISTARIL) 10 MG tablet Take 1 tablet (10 mg total) by mouth 2 (two) times daily.  Marland Kitchen levothyroxine (SYNTHROID, LEVOTHROID) 75 MCG tablet Take 1 tablet (75 mcg total) by mouth daily before breakfast.  . pregabalin (LYRICA) 50 MG capsule Take 1 capsule (50 mg total) by mouth 3 (three) times daily.  . promethazine (PHENERGAN) 25 MG tablet Take 1 tablet (25 mg total) by mouth every 8 (eight) hours as needed for nausea or vomiting.  . feeding supplement (BOOST / RESOURCE BREEZE) LIQD Take 1 Container by mouth 3 (three) times daily between meals. (Patient not taking: Reported on 11/09/2017)  . folic acid (FOLVITE) 1 MG tablet Take  1 tablet (1 mg total) by mouth daily. (Patient not taking: Reported on 11/09/2017)  . gabapentin (NEURONTIN) 300 MG capsule Take 1 capsule (300 mg total) by mouth 3 (three) times  daily. (Patient not taking: Reported on 12/23/2017)  . PEG-KCl-NaCl-NaSulf-Na Asc-C (PLENVU) 140 g SOLR Take 1 kit by mouth as directed. (Patient not taking: Reported on 12/23/2017)  . [DISCONTINUED] Calcium Citrate 200 MG TABS Take 2 tablets (400 mg total) by mouth daily.  . [DISCONTINUED] QUEtiapine (SEROQUEL) 50 MG tablet Take 6 tablets (300 mg total) by mouth at bedtime. For mood control (Patient not taking: Reported on 02/05/2015)   FAMILY HISTORY:  Her indicated that her mother is alive. She indicated that her father is deceased. She indicated that her sister is alive. She indicated that her brother is alive. She indicated that the status of her maternal grandmother is unknown. She indicated that the status of her maternal grandfather is unknown. She indicated that the status of her paternal grandfather is unknown. She indicated that the status of her neg hx is unknown.  SOCIAL HISTORY: She  reports that she has been smoking cigarettes.  She has a 5.00 pack-year smoking history. she has never used smokeless tobacco. She reports that she does not drink alcohol or use drugs.  REVIEW OF SYSTEMS:   Review of Systems  Constitutional: Positive for fever.  HENT: Negative.   Eyes: Negative.   Respiratory: Negative.   Cardiovascular: Negative.   Gastrointestinal: Positive for abdominal pain, blood in stool, diarrhea, nausea and vomiting.  Genitourinary: Positive for dysuria.  Musculoskeletal: Negative.   Skin: Negative.   Neurological: Negative.   Endo/Heme/Allergies: Negative.   Psychiatric/Behavioral: Negative.    SUBJECTIVE:  Lying on ER stretcher c/o abdominal pain.   VITAL SIGNS: BP (!) 96/43   Pulse 86   Temp 98.3 F (36.8 C)   Resp (!) 21   Ht 5' 1"  (1.549 m)   Wt 133 lb (60.3 kg)   LMP 06/01/2014   SpO2 100%   BMI 25.13 kg/m   INTAKE / OUTPUT: No intake/output data recorded.  PHYSICAL EXAMINATION: General: WDWN Adult female, in moderate distress from pain Neuro:  Awake/alert, moving all extremities  HEENT: OP clear, MM moist, Scleral icterus Cardiovascular: RRR no m/r/g Lungs: CTA b/l Abdomen: Soft, TTP in BLQ, no guarding or rebound; BS normoactive  Musculoskeletal: no LE edema  Skin: no rashes   LABS:  BMET Recent Labs  Lab 12/23/17 2036  NA 132*  K 3.8  CL 98*  CO2 20*  BUN <5*  CREATININE 0.90  GLUCOSE 136*   Electrolytes Recent Labs  Lab 12/23/17 2036  CALCIUM 8.8*   CBC Recent Labs  Lab 12/23/17 2036 12/23/17 2253  WBC 4.7  --   HGB 5.4*  --   HCT 15.8*  --   PLT 65* 61*   Coag's Recent Labs  Lab 12/23/17 2036 12/23/17 2253  APTT  --  44*  INR 1.64 1.80   Sepsis Markers Recent Labs  Lab 12/23/17 2052 12/23/17 2216  LATICACIDVEN 4.83* 3.79*   ABG No results for input(s): PHART, PCO2ART, PO2ART in the last 168 hours.  Liver Enzymes Recent Labs  Lab 12/23/17 2036  AST 49*  ALT 19  ALKPHOS 135*  BILITOT 5.5*  ALBUMIN 3.0*   Cardiac Enzymes No results for input(s): TROPONINI, PROBNP in the last 168 hours.  Glucose No results for input(s): GLUCAP in the last 168 hours.  Imaging Dg Chest 2 View  Result Date: 12/23/2017  CLINICAL DATA:  Cough congestion and fever EXAM: CHEST - 2 VIEW COMPARISON:  09/20/2017 FINDINGS: The heart size and mediastinal contours are within normal limits. Linear scarring or atelectasis at the left base. Normal heart size. No pneumothorax. IMPRESSION: No active cardiopulmonary disease. Linear scarring or atelectasis at the left lung base. Electronically Signed   By: Donavan Foil M.D.   On: 12/23/2017 21:10   CULTURES: Blood cultures (3/6): pending   ANTIBIOTICS: Vancomycin 3/6 Zosyn 3/6  SIGNIFICANT EVENTS: 3/6 PM: presented to ER with sepsis, hypotension, and worsened anemia  LINES/TUBES: 2-PIV's Pur Wik catheter  DISCUSSION: 46yoF with Cirrhosis (unclear if NASH vs Alcoholic), GERD, HTN, Anemia, and Recurrent Cdiff, who presents to the ER today c/o Fever x  several days and with N/V x 1 day.  ASSESSMENT / PLAN:  PULMONARY No active issues CXR on my review shows no atelectasis or infiltrates  CARDIOVASCULAR 1. Hx HTN: - her chart lists history of HTN, but review of chart shows baseline BP is typically 90-110's/60's; Question if this history of HTN was remote.   2. Shock (resolved): BP currently only mildly decreased from her baseline with SBP in the 90's after 2L IVF. After 1u pRBC, BP improved to 110/47 which IS her baseline. She still has another 2nd unit of pRBC yet to receive. Therefore her shock is now resolved. Given that she is a cirrhotic would aim for MAP goal > 55.   RENAL 1. Urinary retention: - place foley catheter now   GASTROINTESTINAL 1. Anemia; Possible LGIB; Hx GERD: - Hgb 5.4 down from baseline 8.1-9.3; Patient has had N/V x 1 day but denies any hematemesis or coffee grounds. She reports diarrhea x several days but reports it is mostly nonbloody watery with only 1 episode of blood filling the toilet, which may be due to hemorrhoids. On my exam she has watery yellow stool that is grossly nonbloody. - Being transfused 1 out of 2 u pRBC now. Repeat CBC post transfusion. Monitor for signs of GIB.  - CT Abdomen shows no free fluid but also shows no colonic thickening.  - IV PPI qday  2. Diarrhea; Hx Cdiff: - check Cdiff again now - continue contact precautions  3. Cirrhosis; Hyperbilirubinemia: - review of old labs show patient always has hyperbilirubinemia and mildly elevated LFT's; continue to monitor - minimize opiates given decreased clearance from poor liver function.   HEMATOLOGIC 1. Anemia: discussed above 2. Thrombocytopenia: due to her cirrhosis  INFECTIOUS 1. Sepsis:  - Blood culture pending - place foley and obtain UA and Urine culture - CXR on my review shows no infiltrates - check Respiratory viral panel - No ascites on CT Abdomen or bowel thickening.  - continue empiric antibiotics; trend lactate.  Check procalcitonin  ENDOCRINE No active issues   NEUROLOGIC No active issues    FAMILY  - Updates: no family present in ER at time of my exam - Inter-disciplinary family meet or Palliative Care meeting due by: 12/30/17 - Disposition: patient does not need admission to ICU at this time as her blood pressure is at her baseline. Recommend admit to step-down unit under Hospitalist care.    60 minutes critical care time  Vernie Murders, MD  Pulmonary and [place] Pager: [phone]  12/24/2017, 2:02 AM

## 2017-12-24 NOTE — ED Notes (Signed)
Iv levophed discd  Coming down slowly

## 2017-12-25 DIAGNOSIS — E870 Hyperosmolality and hypernatremia: Secondary | ICD-10-CM

## 2017-12-25 DIAGNOSIS — D696 Thrombocytopenia, unspecified: Secondary | ICD-10-CM

## 2017-12-25 DIAGNOSIS — D689 Coagulation defect, unspecified: Secondary | ICD-10-CM

## 2017-12-25 DIAGNOSIS — R6521 Severe sepsis with septic shock: Secondary | ICD-10-CM

## 2017-12-25 LAB — COMPREHENSIVE METABOLIC PANEL
ALT: 17 U/L (ref 14–54)
AST: 41 U/L (ref 15–41)
Albumin: 2.6 g/dL — ABNORMAL LOW (ref 3.5–5.0)
Alkaline Phosphatase: 127 U/L — ABNORMAL HIGH (ref 38–126)
Anion gap: 10 (ref 5–15)
BILIRUBIN TOTAL: 6.4 mg/dL — AB (ref 0.3–1.2)
BUN: 7 mg/dL (ref 6–20)
CHLORIDE: 103 mmol/L (ref 101–111)
CO2: 19 mmol/L — ABNORMAL LOW (ref 22–32)
Calcium: 7.4 mg/dL — ABNORMAL LOW (ref 8.9–10.3)
Creatinine, Ser: 1.45 mg/dL — ABNORMAL HIGH (ref 0.44–1.00)
GFR, EST AFRICAN AMERICAN: 49 mL/min — AB (ref >60)
GFR, EST NON AFRICAN AMERICAN: 42 mL/min — AB (ref >60)
Glucose, Bld: 101 mg/dL — ABNORMAL HIGH (ref 65–99)
POTASSIUM: 3.5 mmol/L (ref 3.5–5.1)
Sodium: 132 mmol/L — ABNORMAL LOW (ref 135–145)
TOTAL PROTEIN: 6.4 g/dL — AB (ref 6.5–8.1)

## 2017-12-25 LAB — CBC
HCT: 21 % — ABNORMAL LOW (ref 36.0–46.0)
HEMOGLOBIN: 7 g/dL — AB (ref 12.0–15.0)
MCH: 35 pg — AB (ref 26.0–34.0)
MCHC: 33.3 g/dL (ref 30.0–36.0)
MCV: 105 fL — AB (ref 78.0–100.0)
Platelets: 52 10*3/uL — ABNORMAL LOW (ref 150–400)
RBC: 2 MIL/uL — AB (ref 3.87–5.11)
WBC: 5.8 10*3/uL (ref 4.0–10.5)

## 2017-12-25 LAB — URINE CULTURE: CULTURE: NO GROWTH

## 2017-12-25 LAB — OCCULT BLOOD X 1 CARD TO LAB, STOOL: FECAL OCCULT BLD: NEGATIVE

## 2017-12-25 LAB — GLUCOSE, CAPILLARY: GLUCOSE-CAPILLARY: 76 mg/dL (ref 65–99)

## 2017-12-25 LAB — DIRECT ANTIGLOBULIN TEST (NOT AT ARMC)
DAT, IgG: NEGATIVE
DAT, complement: NEGATIVE

## 2017-12-25 LAB — FOLATE RBC
Folate, Hemolysate: 283.1 ng/mL
Folate, RBC: 1506 ng/mL (ref >498)
Hematocrit: 18.8 % — ABNORMAL LOW (ref 34.0–46.6)

## 2017-12-25 LAB — RETICULOCYTES
RBC.: 2.07 MIL/uL — ABNORMAL LOW (ref 3.87–5.11)
RETIC COUNT ABSOLUTE: 107.6 10*3/uL (ref 19.0–186.0)
Retic Ct Pct: 5.2 % — ABNORMAL HIGH (ref 0.4–3.1)

## 2017-12-25 LAB — SEDIMENTATION RATE: Sed Rate: 37 mm/hr — ABNORMAL HIGH (ref 0–22)

## 2017-12-25 LAB — HAPTOGLOBIN: Haptoglobin: 19 mg/dL — ABNORMAL LOW (ref 34–200)

## 2017-12-25 LAB — HEMOGLOBIN AND HEMATOCRIT, BLOOD
HEMATOCRIT: 21.4 % — AB (ref 36.0–46.0)
HEMOGLOBIN: 7.3 g/dL — AB (ref 12.0–15.0)

## 2017-12-25 LAB — PROCALCITONIN: PROCALCITONIN: 0.71 ng/mL

## 2017-12-25 LAB — C-REACTIVE PROTEIN: CRP: 6.5 mg/dL — ABNORMAL HIGH (ref <1.0)

## 2017-12-25 LAB — MRSA PCR SCREENING: MRSA BY PCR: NEGATIVE

## 2017-12-25 MED ORDER — LOPERAMIDE HCL 2 MG PO CAPS
2.0000 mg | ORAL_CAPSULE | Freq: Four times a day (QID) | ORAL | Status: DC | PRN
  Administered 2017-12-25: 2 mg via ORAL
  Filled 2017-12-25: qty 1

## 2017-12-25 MED ORDER — VANCOMYCIN HCL 500 MG IV SOLR
500.0000 mg | Freq: Two times a day (BID) | INTRAVENOUS | Status: DC
  Administered 2017-12-25: 500 mg via INTRAVENOUS
  Filled 2017-12-25 (×4): qty 500

## 2017-12-25 NOTE — Progress Notes (Addendum)
Daily Rounding Note  12/25/2017, 8:13 AM  LOS: 1 day   SUBJECTIVE:   Chief complaint: still feels weak and chilled.  No sweats.  Foley placed due to difficulty voiding.  Still with Fevers to 103 at 2100 last night.  Hypotension persists.      Watery diarrhea after the oral contrast, has subsided overnight.  No stools for ~ 8 or more hours.  Tolerating solid food.  No nausea.  Intermittent discomfort in pelvis, lower abdomen: same as always, responds to Bentyl.    OBJECTIVE:         Vital signs in last 24 hours:    Temp:  [97.7 F (36.5 C)-103 F (39.4 C)] 98.7 F (37.1 C) (03/08 0811) Pulse Rate:  [71-113] 88 (03/08 0434) Resp:  [18-35] 25 (03/07 2102) BP: (88-121)/(41-84) 103/51 (03/08 0434) SpO2:  [91 %-100 %] 93 % (03/08 0434) Weight:  [140 lb 14 oz (63.9 kg)] 140 lb 14 oz (63.9 kg) (03/07 1634) Last BM Date: 12/24/17 Filed Weights   12/23/17 2019 12/24/17 1634  Weight: 133 lb (60.3 kg) 140 lb 14 oz (63.9 kg)   General: looks jaundiced and unwell.  Alert, pleasant.      Heart: RRR Chest: crackles in right >> left base.   Abdomen: soft, NT, ND.  Minor if any lower abdominal tenderness  Extremities: no CCE Neuro/Psych:  Oriented x 3.  No asterixis.  Slight UE tremor.    Intake/Output from previous day: 03/07 0701 - 03/08 0700 In: 365 [Blood:315; IV Piggyback:50] Out: 2100 [Urine:2100]  Intake/Output this shift: No intake/output data recorded.  Lab Results: Recent Labs    12/24/17 0946 12/24/17 1515 12/24/17 1703 12/25/17 0214  WBC 3.3*  --  4.6 5.8  HGB 6.7* 6.5* 6.6* 7.0*  HCT 20.4* 19.6* 19.9* 21.0*  PLT 59*  --  60* 52*   BMET Recent Labs    12/23/17 2036 12/24/17 0638 12/25/17 0214  NA 132* 139 132*  K 3.8 3.5 3.5  CL 98* 107 103  CO2 20* 22 19*  GLUCOSE 136* 89 101*  BUN <5* 5* 7  CREATININE 0.90 0.87 1.45*  CALCIUM 8.8* 7.9* 7.4*   LFT Recent Labs    12/23/17 2036 12/24/17 0638  12/24/17 0946 12/25/17 0214  PROT 7.6 6.9  --  6.4*  ALBUMIN 3.0* 2.8*  --  2.6*  AST 49* 44*  --  41  ALT 19 19  --  17  ALKPHOS 135* 117  --  127*  BILITOT 5.5* 8.6* 8.2* 6.4*  BILIDIR  --   --  2.0*  --   IBILI  --   --  6.2*  --    PT/INR Recent Labs    12/23/17 2253 12/24/17 0638  LABPROT 20.8* 21.1*  INR 1.80 1.84   Hepatitis Panel No results for input(s): HEPBSAG, HCVAB, HEPAIGM, HEPBIGM in the last 72 hours.  Studies/Results: Ct Abdomen Pelvis Wo Contrast  Result Date: 12/24/2017 CLINICAL DATA:  Status post fecal transplant with fever and chills EXAM: CT ABDOMEN AND PELVIS WITHOUT CONTRAST TECHNIQUE: Multidetector CT imaging of the abdomen and pelvis was performed following the standard protocol without IV contrast. COMPARISON:  09/25/2017, 09/20/2017, 06/28/2017, 04/28/2017, and multiple prior CT abdomen pelvis. FINDINGS: Lower chest: Visible lung bases demonstrate patchy dependent atelectasis. No acute consolidation or pleural effusion. Normal heart size. Hepatobiliary: Cirrhotic appearing liver with nodular contour. Enlarged caudate lobe. Surgical clips in the gallbladder fossa. No biliary dilatation.  Pancreas: Unremarkable. No pancreatic ductal dilatation or surrounding inflammatory changes. Spleen: Enlarged Adrenals/Urinary Tract: Adrenal glands are within normal limits. Mildly atrophic left kidney with cortical scarring present at the mid and upper pole. No hydronephrosis. The bladder is normal Stomach/Bowel: The stomach is nonenlarged. No dilated small bowel. No significant colon wall thickening. Normal appendix. Vascular/Lymphatic: Mild aortic atherosclerosis. No aneurysmal dilatation. No significantly enlarged lymph nodes. Reproductive: Uterus and bilateral adnexa are unremarkable. Other: Negative for free air or free fluid. Stable mild soft tissue stranding/edema at the root of the mesentery. Stable minimal edema and soft tissue thickening in the right gutter.  Musculoskeletal: No acute or significant osseous findings. IMPRESSION: 1. No acute interval changes since comparison CT. Mild soft tissue thickening and edema in the right gutter 2. Morphologic changes consistent with cirrhosis.  Splenomegaly. 3. Mildly atrophic left kidney with scarring. Electronically Signed   By: Donavan Foil M.D.   On: 12/24/2017 02:23   Dg Chest 2 View  Result Date: 12/23/2017 CLINICAL DATA:  Cough congestion and fever EXAM: CHEST - 2 VIEW COMPARISON:  09/20/2017 FINDINGS: The heart size and mediastinal contours are within normal limits. Linear scarring or atelectasis at the left base. Normal heart size. No pneumothorax. IMPRESSION: No active cardiopulmonary disease. Linear scarring or atelectasis at the left lung base. Electronically Signed   By: Donavan Foil M.D.   On: 12/23/2017 21:10    ASSESMENT:   *  Acute on Chronic anemia.  Signif macrocytosis to 124.  Folate, B12 ok.  Good response to 2 U PRBC.  Haptoglobin 19 (rr 34-200).  No schistocytes on smear.  Elevated D dimer.      *   Sepsis, hypotension, shock.  Fevers continue.  Lactic acid now normal.  No growth on urine and blood clx.   Empiric Zosyn, Vanc day 3  *    Cirrhosis of liver.  MELD-Na 24.  Bilirubin (mostly indirect) and AST improved.     *    IBS, mixed pattern.  Prior C diff episodes.  S/p FMT 11/11/17 with improved sxs and currently C diff negative.   Colestid and Pancrease added 3/7.    *   Hyponatremia.    *   Thrombocytopenia.  Splenomegaly.      *   Coagulopathy.    *   Stable mesenteric root edema.    *   Difficulty voiding.     PLAN   *  Supportive care.  Hgb, retic count this afternoon and CBC, coags, BMET,  Hepatic fx profile in AM.      Jamie Allen  12/25/2017, 8:13 AM Pager: [phone]

## 2017-12-25 NOTE — Progress Notes (Signed)
Pharmacy Antibiotic Note  Jamie Allen is a 47 y.o. female admitted on 12/23/2017 with sepsis.  Pharmacy has been consulted for vancomycin and zosyn dosing. Pt w/ recent h/o fecal transplant for C.diff. Case discussed with MD about risks/benefits of broad spectrum abx in patient with history of repeat C.diff. Given likely underlying infection and no clear source, will initiate broad spectrum abx for now. Continuing broad spectrum abx for now. GI following. SCr has bumped today to 1.45.  Plan: -Adjust Vancomycin to 500 mg IV Q 12 hours -Continue Zosyn 3.375 gm IV Q 8 hours (EI infusion) -Monitor CBC, renal fx, cultures and clinical progress -VT at SS  Height: 5\' 1"  (154.9 cm) Weight: 140 lb 14 oz (63.9 kg) IBW/kg (Calculated) : 47.8  Temp (24hrs), Avg:99.3 F (37.4 C), Min:97.7 F (36.5 C), Max:103 F (39.4 C)  Recent Labs  Lab 12/23/17 2036 12/23/17 2052 12/23/17 2216 12/24/17 0638 12/24/17 0946 12/24/17 1703 12/25/17 0214  WBC 4.7  --   --   --  3.3* 4.6 5.8  CREATININE 0.90  --   --  0.87  --   --  1.45*  LATICACIDVEN  --  4.83* 3.79* 1.5  --   --   --     Estimated Creatinine Clearance: 41 mL/min (A) (by C-G formula based on SCr of 1.45 mg/dL (H)).    Allergies  Allergen Reactions  . Iohexol Hives, Itching and Swelling  . Morphine And Related Itching and Other (See Comments)    Can tolerate with Benadryl    Antimicrobials this admission: 3/6 Vanc  >>  3/6 Zosyn >>   Dose adjustments this admission:   Microbiology results:  3/6 BCx: ngtd 3/7 Stool Cx: sent 3/7 Urine Cx: ngf  3/7 RVP: negative  3/8 MRSA PCR: negative 3/7 Cdiff PCR: negative (recently treated for Cdiff)  Thank you for allowing [name] to participate in this patients care.  Jens Som, PharmD Clinical phone for 12/25/2017 from 7a-3:30p: x [phone] If after 3:30p, please call main pharmacy at: [phone] 12/25/2017 12:49 PM

## 2017-12-25 NOTE — Progress Notes (Signed)
PROGRESS NOTE    Jamie Allen  YDX:[id number] DOB: 11-08-1970 DOA: 12/23/2017 PCP: Ladell Pier, MD   Brief Narrative: Jamie Allen is a 47 y.o. 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 She presents with fevers/chills/nausea and vomiting concerning for sepsis with shock. Also with significant anemia of unknown cause. S/p 3 units of PRBC to date.   Assessment & Plan:   Principal Problem:   Septic shock ([place]) Active Problems:   Anxiety and depression   Hypothyroidism   GERD (gastroesophageal reflux disease)   Abdominal pain   C. difficile enteritis   Alcoholic cirrhosis of liver (HCC)   Tobacco abuse   Macrocytic anemia   Thrombocytopenia (HCC)   Alcohol abuse   Septic shock Unknown source but likely GI. Initially required Levophed which was weaned off. Evaluated by PCCM on admission. No ascites on CT.  Abdomen. Resolved. Still no source. CRP and sed rate slightly elevated. Procalcitonin mildly elevated and trending down. Urine culture negative. No ascites fluid for sampling. -Blood culture pending  History of C. Difficile enteritis C. Diff negative.  Alcoholic cirrhosis MELD score of 21. Patient reports last drink was a week ago. AST/ALT stable. Bilirubin significantly elevated. Ammonia wnl. -GI recommendations  Alcohol abuse -CIWA  Macrocytic anemia Recent rectal bleeding. S/p 2 units PRBC 3/6 and another 1 unit on 3/7. Stable currently but not a great response with the 1 unit. Has a recent history of transfusion 1-2 months ago. Haptoglobin low. LDH within normal limits. Folate and B12 normal. MCV improved from admission. -folate/B12 pending -H&H this afternoon -DAT  Tobacco abuse -Continue nicotine patch  Thrombocytopenia Secondary to  cirrhosis. Stable.  GERD -Continue Protonix  Hypothyroidism -Continue Synthroid  Depression/Anxiety Not currently on medications   DVT prophylaxis: SCDs Code Status:   Code Status: Full Code Family Communication: Significant other at bedside Disposition Plan: TBD   Consultants:   [facility]  Procedures:   2 units PRBC (12/23/17)  1 unit PRBC (12/24/17)  Antimicrobials:  Vancomycin  Zosyn    Subjective: Febrile to 103 overnight. Patient feels better than when she came in. No confusion, headaches, belly pain.  Objective: Vitals:   12/25/17 0434 12/25/17 0507 12/25/17 0811 12/25/17 0948  BP: (!) 103/51   (!) 95/46  Pulse: 88   100  Resp:      Temp:  97.7 F (36.5 C) 98.7 F (37.1 C)   TempSrc:  Oral Oral   SpO2: 93%   99%  Weight:      Height:        Intake/Output Summary (Last 24 hours) at 12/25/2017 1144 Last data filed at 12/25/2017 0505 Gross per 24 hour  Intake 315 ml  Output 700 ml  Net -385 ml   Filed Weights   12/23/17 2019 12/24/17 1634  Weight: 60.3 kg (133 lb) 63.9 kg (140 lb 14 oz)    Examination:  General exam: Appears calm and comfortable HEENT: scleral icterus Respiratory system: Clear to auscultation. Respiratory effort normal. Cardiovascular system: S1 & S2 heard, RRR. No murmurs, rubs, gallops or clicks. Gastrointestinal system: Abdomen is slightly distended, soft and nontender. Normal bowel sounds heard. Central nervous system: Alert and oriented. No focal neurological deficits. Extremities: No edema. No calf tenderness Skin: No cyanosis. No rashes. Jaundice of forehead Psychiatry: Judgement and insight appear normal. Mood & affect appropriate.  Data Reviewed: I have personally reviewed following labs and imaging studies  CBC: Recent Labs  Lab 12/23/17 2036 12/23/17 2253 12/24/17 0946 12/24/17 1515 12/24/17 1703 12/25/17 0214  WBC 4.7  --  3.3*  --  4.6 5.8  NEUTROABS 3.9  --   --   --   --   --   HGB 5.4*  --   6.7* 6.5* 6.6* 7.0*  HCT 15.8*  --  20.4* 19.6* 19.9* 21.0*  MCV 124.4*  --  106.8*  --  106.4* 105.0*  PLT 65* 61* 59*  --  60* 52*   Basic Metabolic Panel: Recent Labs  Lab 12/23/17 2036 12/24/17 0638 12/25/17 0214  NA 132* 139 132*  K 3.8 3.5 3.5  CL 98* 107 103  CO2 20* 22 19*  GLUCOSE 136* 89 101*  BUN <5* 5* 7  CREATININE 0.90 0.87 1.45*  CALCIUM 8.8* 7.9* 7.4*   GFR: Estimated Creatinine Clearance: 41 mL/min (A) (by C-G formula based on SCr of 1.45 mg/dL (H)). Liver Function Tests: Recent Labs  Lab 12/23/17 2036 12/24/17 0638 12/24/17 0946 12/25/17 0214  AST 49* 44*  --  41  ALT 19 19  --  17  ALKPHOS 135* 117  --  127*  BILITOT 5.5* 8.6* 8.2* 6.4*  PROT 7.6 6.9  --  6.4*  ALBUMIN 3.0* 2.8*  --  2.6*   Recent Labs  Lab 12/23/17 2036  LIPASE 33   Recent Labs  Lab 12/23/17 2320  AMMONIA 24   Coagulation Profile: Recent Labs  Lab 12/23/17 2036 12/23/17 2253 12/24/17 0638  INR 1.64 1.80 1.84   Cardiac Enzymes: No results for input(s): CKTOTAL, CKMB, CKMBINDEX, TROPONINI in the last 168 hours. BNP (last 3 results) No results for input(s): PROBNP in the last 8760 hours. HbA1C: No results for input(s): HGBA1C in the last 72 hours. CBG: Recent Labs  Lab 12/24/17 0753 12/25/17 0759  GLUCAP 72 76   Lipid Profile: No results for input(s): CHOL, HDL, LDLCALC, TRIG, CHOLHDL, LDLDIRECT in the last 72 hours. Thyroid Function Tests: No results for input(s): TSH, T4TOTAL, FREET4, T3FREE, THYROIDAB in the last 72 hours. Anemia Panel: Recent Labs    12/24/17 1515  VITAMINB12 758   Sepsis Labs: Recent Labs  Lab 12/23/17 2052 12/23/17 2216 12/24/17 0638 12/25/17 0214  PROCALCITON  --   --  0.24 0.71  LATICACIDVEN 4.83* 3.79* 1.5  --     Recent Results (from the past 240 hour(s))  Blood culture (routine x 2)     Status: None (Preliminary result)   Collection Time: 12/23/17  7:50 PM  Result Value Ref Range Status   Specimen Description BLOOD  RIGHT HAND  Final   Special Requests   Final    IN PEDIATRIC BOTTLE Blood Culture results may not be optimal due to an excessive volume of blood received in culture bottles   Culture   Final    NO GROWTH < 12 HOURS Performed at [facility] [address], [place], [place] [address]    Report Status PENDING  Incomplete  Blood culture (routine x 2)     Status: None (Preliminary result)   Collection Time: 12/23/17  7:50 PM  Result Value Ref Range Status   Specimen Description BLOOD LEFT ANTECUBITAL  Final   Special Requests   Final    BOTTLES DRAWN AEROBIC AND ANAEROBIC Blood Culture adequate volume   Culture   Final    NO GROWTH < 12 HOURS Performed  at [facility], [place] [address], [place], [place] [address]    Report Status PENDING  Incomplete  C difficile quick scan w PCR reflex     Status: None   Collection Time: 12/24/17  2:53 AM  Result Value Ref Range Status   C Diff antigen NEGATIVE NEGATIVE Final   C Diff toxin NEGATIVE NEGATIVE Final   C Diff interpretation No C. difficile detected.  Final    Comment: Performed at [facility], [place] [address], [place], [place] [address]  Urine culture     Status: None   Collection Time: 12/24/17  3:30 AM  Result Value Ref Range Status   Specimen Description URINE, RANDOM  Final   Special Requests NONE  Final   Culture   Final    NO GROWTH Performed at [facility], [place] [address], [place], [place] [address]    Report Status 12/25/2017 FINAL  Final  Respiratory Panel by PCR     Status: None   Collection Time: 12/24/17  6:20 AM  Result Value Ref Range Status   Adenovirus NOT DETECTED NOT DETECTED Final   Coronavirus 229E NOT DETECTED NOT DETECTED Final   Coronavirus HKU1 NOT DETECTED NOT DETECTED Final   Coronavirus NL63 NOT DETECTED NOT DETECTED Final   Coronavirus OC43 NOT DETECTED NOT DETECTED Final   Metapneumovirus NOT DETECTED NOT DETECTED Final   Rhinovirus / Enterovirus NOT DETECTED NOT DETECTED  Final   Influenza A NOT DETECTED NOT DETECTED Final   Influenza B NOT DETECTED NOT DETECTED Final   Parainfluenza Virus 1 NOT DETECTED NOT DETECTED Final   Parainfluenza Virus 2 NOT DETECTED NOT DETECTED Final   Parainfluenza Virus 3 NOT DETECTED NOT DETECTED Final   Parainfluenza Virus 4 NOT DETECTED NOT DETECTED Final   Respiratory Syncytial Virus NOT DETECTED NOT DETECTED Final   Bordetella pertussis NOT DETECTED NOT DETECTED Final   Chlamydophila pneumoniae NOT DETECTED NOT DETECTED Final   Mycoplasma pneumoniae NOT DETECTED NOT DETECTED Final  MRSA PCR Screening     Status: None   Collection Time: 12/25/17  5:12 AM  Result Value Ref Range Status   MRSA by PCR NEGATIVE NEGATIVE Final    Comment:        The GeneXpert MRSA Assay (FDA approved for NASAL specimens only), is one component of a comprehensive MRSA colonization surveillance program. It is not intended to diagnose MRSA infection nor to guide or monitor treatment for MRSA infections. Performed at [facility], [place] [address] [place], [place] [address]          Radiology Studies: Ct Abdomen Pelvis Wo Contrast  Result Date: 12/24/2017 CLINICAL DATA:  Status post fecal transplant with fever and chills EXAM: CT ABDOMEN AND PELVIS WITHOUT CONTRAST TECHNIQUE: Multidetector CT imaging of the abdomen and pelvis was performed following the standard protocol without IV contrast. COMPARISON:  09/25/2017, 09/20/2017, 06/28/2017, 04/28/2017, and multiple prior CT abdomen pelvis. FINDINGS: Lower chest: Visible lung bases demonstrate patchy dependent atelectasis. No acute consolidation or pleural effusion. Normal heart size. Hepatobiliary: Cirrhotic appearing liver with nodular contour. Enlarged caudate lobe. Surgical clips in the gallbladder fossa. No biliary dilatation. Pancreas: Unremarkable. No pancreatic ductal dilatation or surrounding inflammatory changes. Spleen: Enlarged Adrenals/Urinary Tract: Adrenal glands are within  normal limits. Mildly atrophic left kidney with cortical scarring present at the mid and upper pole. No hydronephrosis. The bladder is normal Stomach/Bowel: The stomach is nonenlarged. No dilated small bowel. No significant colon wall thickening.  Normal appendix. Vascular/Lymphatic: Mild aortic atherosclerosis. No aneurysmal dilatation. No significantly enlarged lymph nodes. Reproductive: Uterus and bilateral adnexa are unremarkable. Other: Negative for free air or free fluid. Stable mild soft tissue stranding/edema at the root of the mesentery. Stable minimal edema and soft tissue thickening in the right gutter. Musculoskeletal: No acute or significant osseous findings. IMPRESSION: 1. No acute interval changes since comparison CT. Mild soft tissue thickening and edema in the right gutter 2. Morphologic changes consistent with cirrhosis.  Splenomegaly. 3. Mildly atrophic left kidney with scarring. Electronically Signed   By: Donavan Foil M.D.   On: 12/24/2017 02:23   Dg Chest 2 View  Result Date: 12/23/2017 CLINICAL DATA:  Cough congestion and fever EXAM: CHEST - 2 VIEW COMPARISON:  09/20/2017 FINDINGS: The heart size and mediastinal contours are within normal limits. Linear scarring or atelectasis at the left base. Normal heart size. No pneumothorax. IMPRESSION: No active cardiopulmonary disease. Linear scarring or atelectasis at the left lung base. Electronically Signed   By: Donavan Foil M.D.   On: 12/23/2017 21:10        Scheduled Meds: . colestipol  1 g Oral BID  . folic acid  1 mg Oral Daily  . hydrOXYzine  10 mg Oral BID  . levothyroxine  75 mcg Oral QAC breakfast  . lipase/protease/amylase  72,000 Units Oral TID WC  . LORazepam  0-4 mg Intravenous Q6H   Followed by  . [START ON 12/26/2017] LORazepam  0-4 mg Intravenous Q12H  . multivitamin with minerals  1 tablet Oral Daily  . nicotine  21 mg Transdermal Daily  . pantoprazole  40 mg Oral Q0600  . pregabalin  50 mg Oral TID  . thiamine   100 mg Oral Daily   Or  . thiamine  100 mg Intravenous Daily   Continuous Infusions: . sodium chloride    . piperacillin-tazobactam (ZOSYN)  IV Stopped (12/25/17 0901)  . vancomycin Stopped (12/25/17 0041)     LOS: 1 day     Cordelia Poche, MD [facility] 12/25/2017, 11:44 AM Pager: [phone]  If 7PM-7AM, please contact night-coverage [email or web address] Password [facility] 12/25/2017, 11:44 AM

## 2017-12-26 ENCOUNTER — Inpatient Hospital Stay (HOSPITAL_COMMUNITY): Payer: Medicaid Other

## 2017-12-26 DIAGNOSIS — N179 Acute kidney failure, unspecified: Secondary | ICD-10-CM

## 2017-12-26 DIAGNOSIS — R161 Splenomegaly, not elsewhere classified: Secondary | ICD-10-CM

## 2017-12-26 DIAGNOSIS — R7989 Other specified abnormal findings of blood chemistry: Secondary | ICD-10-CM

## 2017-12-26 DIAGNOSIS — E611 Iron deficiency: Secondary | ICD-10-CM

## 2017-12-26 DIAGNOSIS — N144 Toxic nephropathy, not elsewhere classified: Secondary | ICD-10-CM

## 2017-12-26 LAB — VANCOMYCIN, TROUGH: VANCOMYCIN TR: 56 ug/mL — AB (ref 15–20)

## 2017-12-26 LAB — CBC
HCT: 21 % — ABNORMAL LOW (ref 36.0–46.0)
Hemoglobin: 7.1 g/dL — ABNORMAL LOW (ref 12.0–15.0)
MCH: 35.7 pg — AB (ref 26.0–34.0)
MCHC: 33.8 g/dL (ref 30.0–36.0)
MCV: 105.5 fL — ABNORMAL HIGH (ref 78.0–100.0)
PLATELETS: 47 10*3/uL — AB (ref 150–400)
RBC: 1.99 MIL/uL — AB (ref 3.87–5.11)
WBC: 4.1 10*3/uL (ref 4.0–10.5)

## 2017-12-26 LAB — HEPATIC FUNCTION PANEL
ALBUMIN: 2.5 g/dL — AB (ref 3.5–5.0)
ALT: 20 U/L (ref 14–54)
AST: 56 U/L — ABNORMAL HIGH (ref 15–41)
Alkaline Phosphatase: 108 U/L (ref 38–126)
BILIRUBIN TOTAL: 5 mg/dL — AB (ref 0.3–1.2)
Bilirubin, Direct: 1.9 mg/dL — ABNORMAL HIGH (ref 0.1–0.5)
Indirect Bilirubin: 3.1 mg/dL — ABNORMAL HIGH (ref 0.3–0.9)
Total Protein: 6.8 g/dL (ref 6.5–8.1)

## 2017-12-26 LAB — BASIC METABOLIC PANEL
ANION GAP: 12 (ref 5–15)
BUN: 19 mg/dL (ref 6–20)
CALCIUM: 7.6 mg/dL — AB (ref 8.9–10.3)
CHLORIDE: 99 mmol/L — AB (ref 101–111)
CO2: 20 mmol/L — ABNORMAL LOW (ref 22–32)
CREATININE: 3.14 mg/dL — AB (ref 0.44–1.00)
GFR calc non Af Amer: 17 mL/min — ABNORMAL LOW (ref >60)
GFR, EST AFRICAN AMERICAN: 19 mL/min — AB (ref >60)
Glucose, Bld: 89 mg/dL (ref 65–99)
Potassium: 4.2 mmol/L (ref 3.5–5.1)
SODIUM: 131 mmol/L — AB (ref 135–145)

## 2017-12-26 LAB — SAVE SMEAR

## 2017-12-26 LAB — LACTATE DEHYDROGENASE: LDH: 202 U/L — ABNORMAL HIGH (ref 98–192)

## 2017-12-26 LAB — PROTIME-INR
INR: 1.59
PROTHROMBIN TIME: 18.8 s — AB (ref 11.4–15.2)

## 2017-12-26 LAB — PROCALCITONIN: PROCALCITONIN: 1.5 ng/mL

## 2017-12-26 LAB — RETICULOCYTES
RBC.: 2.14 MIL/uL — ABNORMAL LOW (ref 3.87–5.11)
Retic Count, Absolute: 115.6 10*3/uL (ref 19.0–186.0)
Retic Ct Pct: 5.4 % — ABNORMAL HIGH (ref 0.4–3.1)

## 2017-12-26 LAB — GLUCOSE, CAPILLARY: GLUCOSE-CAPILLARY: 90 mg/dL (ref 65–99)

## 2017-12-26 LAB — VITAMIN B1: Vitamin B1 (Thiamine): 118.5 nmol/L (ref 66.5–200.0)

## 2017-12-26 MED ORDER — SODIUM CHLORIDE 0.9 % IV SOLN
510.0000 mg | Freq: Once | INTRAVENOUS | Status: AC
  Administered 2017-12-26: 510 mg via INTRAVENOUS
  Filled 2017-12-26: qty 17

## 2017-12-26 MED ORDER — PIPERACILLIN-TAZOBACTAM IN DEX 2-0.25 GM/50ML IV SOLN
2.2500 g | Freq: Three times a day (TID) | INTRAVENOUS | Status: DC
  Administered 2017-12-26 – 2017-12-27 (×3): 2.25 g via INTRAVENOUS
  Filled 2017-12-26 (×4): qty 50

## 2017-12-26 MED ORDER — SODIUM CHLORIDE 0.9 % IV SOLN
510.0000 mg | Freq: Once | INTRAVENOUS | Status: DC
  Filled 2017-12-26: qty 17

## 2017-12-26 MED ORDER — PIPERACILLIN-TAZOBACTAM IN DEX 2-0.25 GM/50ML IV SOLN: 2.2500 g | Freq: Three times a day (TID) | INTRAVENOUS | Status: DC

## 2017-12-26 MED ORDER — SODIUM CHLORIDE 0.9 % IV SOLN: 750.0000 mg | Freq: Once | INTRAVENOUS | Status: DC

## 2017-12-26 NOTE — Plan of Care (Signed)
Progressing

## 2017-12-26 NOTE — Progress Notes (Signed)
PROGRESS NOTE    Jamie Allen  VPX:[id number] DOB: 1971/05/10 DOA: 12/23/2017 PCP: Ladell Pier, MD   Brief Narrative: Jamie Allen is a 47 y.o. 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 She presents with fevers/chills/nausea and vomiting concerning for sepsis with shock. Also with significant anemia of unknown cause. S/p 3 units of PRBC to date.   Assessment & Plan:   Principal Problem:   Septic shock ([place]) Active Problems:   Anxiety and depression   Hypothyroidism   GERD (gastroesophageal reflux disease)   Abdominal pain   C. difficile enteritis   Alcoholic cirrhosis of liver (HCC)   Tobacco abuse   Macrocytic anemia   Thrombocytopenia (HCC)   Alcohol abuse   Septic shock Unknown source but likely GI. Initially required Levophed which was weaned off. Evaluated by PCCM on admission. No ascites on CT.  Abdomen. Resolved. Still no source. CRP and sed rate slightly elevated. Procalcitonin elevated and trending up. Urine culture negative. No ascites fluid for sampling. -Blood culture pending -Obtain GI pathogen panel -Discontinue Vancomycin secondary to below -Continue Zosyn  History of C. Difficile enteritis C. Diff negative.  Alcoholic cirrhosis MELD score of 21. Patient reports last drink was a week ago. AST/ALT stable. Bilirubin significantly elevated. Ammonia wnl. Bilirubin trending down -GI recommendations  Alcohol abuse No withdrawal symptoms -CIWA  Macrocytic anemia Recent rectal bleeding. S/p 2 units PRBC 3/6 and another 1 unit on 3/7. Stable currently but not a great response with the 1 unit. Has a recent history of transfusion 1-2 months ago. Haptoglobin low. LDH within normal limits. Folate and B12 normal. MCV improved from admission.  Hemoglobin stable. ?TTP. DAT negative. FOBT negative. -Will touch base with hematology -GI pathogen panel  Tobacco abuse -Continue nicotine patch  Thrombocytopenia Secondary to cirrhosis. Stable.  GERD -Continue Protonix  Hypothyroidism -Continue Synthroid  Depression/Anxiety Not currently on medications  Acute kidney injury Vancomycin-induced nephrotoxicity Obtained vancomycin trough this morning for concern of vanc nephrotoxicity. Vanc trough resulted and is 56 (elevated). With ?TTP, possible contribution. -Discontinue Vancomycin -Daily BMP -Discontinue ibuprofen -Renal ultrasound to ensure no hydronephrosis   DVT prophylaxis: SCDs Code Status:   Code Status: Full Code Family Communication: None at bedside Disposition Plan: TBD   Consultants:   [facility]  Procedures:   2 units PRBC (12/23/17)  1 unit PRBC (12/24/17)  Antimicrobials:  Vancomycin  Zosyn    Subjective: Some right sided pain that radiates from flank to RLQ. Sharp. Present when laying down. Has improved slightly from this morning. No history of kidney stones.  Objective: Vitals:   12/26/17 0409 12/26/17 0508 12/26/17 0533 12/26/17 0635  BP:  96/63 114/63 (!) 100/59  Pulse: 87 90  87  Resp: (!) 23 18 (!) 24 (!) 22  Temp: 98 F (36.7 C)     TempSrc:      SpO2: 94% 100%  100%  Weight:      Height:        Intake/Output Summary (Last 24 hours) at 12/26/2017 0913 Last data filed at 12/26/2017 0631 Gross per 24 hour  Intake 200 ml  Output 600 ml  Net -400 ml   Filed Weights   12/23/17 2019 12/24/17 1634  Weight: 60.3 kg (133 lb) 63.9 kg (140 lb 14 oz)    Examination:  General exam: Calm and comfortable.  HEENT: Scleral icterus Respiratory system: Clear to auscultation. Respiratory effort normal. Cardiovascular system: Regular rate and rhythm. Normal S1 and S2. No heart murmurs present. No extra heart sounds Gastrointestinal system: Abdomen is slightly distended, soft and mildly  tender in RLQ with no rebound or gaurding. Increased bowel sounds heard. Central nervous system: .Alert, oriented, no focal deficits Extremities: No edema or calf tenderness Skin: No cyanosis. No rashes. Jaundice of forehead Psychiatry: Judgement/insight normal. Affect slightly flat with slightly depressed mood.    Data Reviewed: I have personally reviewed following labs and imaging studies  CBC: Recent Labs  Lab 12/23/17 2036 12/23/17 2253 12/24/17 0946  12/24/17 1515 12/24/17 1703 12/25/17 0214 12/25/17 1429 12/26/17 0458  WBC 4.7  --  3.3*  --   --  4.6 5.8  --  4.1  NEUTROABS 3.9  --   --   --   --   --   --   --   --   HGB 5.4*  --  6.7*  --  6.5* 6.6* 7.0* 7.3* 7.1*  HCT 15.8*  --  20.4*   < > 19.6* 19.9* 21.0* 21.4* 21.0*  MCV 124.4*  --  106.8*  --   --  106.4* 105.0*  --  105.5*  PLT 65* 61* 59*  --   --  60* 52*  --  47*   < > = values in this interval not displayed.   Basic Metabolic Panel: Recent Labs  Lab 12/23/17 2036 12/24/17 0638 12/25/17 0214 12/26/17 0458  NA 132* 139 132* 131*  K 3.8 3.5 3.5 4.2  CL 98* 107 103 99*  CO2 20* 22 19* 20*  GLUCOSE 136* 89 101* 89  BUN <5* 5* 7 19  CREATININE 0.90 0.87 1.45* 3.14*  CALCIUM 8.8* 7.9* 7.4* 7.6*   GFR: Estimated Creatinine Clearance: 19 mL/min (A) (by C-G formula based on SCr of 3.14 mg/dL (H)). Liver Function Tests: Recent Labs  Lab 12/23/17 2036 12/24/17 0638 12/24/17 0946 12/25/17 0214 12/26/17 0458  AST 49* 44*  --  41 56*  ALT 19 19  --  17 20  ALKPHOS 135* 117  --  127* 108  BILITOT 5.5* 8.6* 8.2* 6.4* 5.0*  PROT 7.6 6.9  --  6.4* 6.8  ALBUMIN 3.0* 2.8*  --  2.6* 2.5*   Recent Labs  Lab 12/23/17 2036  LIPASE 33   Recent Labs  Lab 12/23/17 2320  AMMONIA 24   Coagulation Profile: Recent Labs  Lab 12/23/17 2036 12/23/17 2253 12/24/17 0638 12/26/17 0458  INR 1.64 1.80 1.84 1.59   Cardiac Enzymes: No results for input(s): CKTOTAL, CKMB, CKMBINDEX, TROPONINI in the last 168  hours. BNP (last 3 results) No results for input(s): PROBNP in the last 8760 hours. HbA1C: No results for input(s): HGBA1C in the last 72 hours. CBG: Recent Labs  Lab 12/24/17 0753 12/25/17 0759 12/26/17 0745  GLUCAP 72 76 90   Lipid Profile: No results for input(s): CHOL, HDL, LDLCALC, TRIG, CHOLHDL, LDLDIRECT in the last 72 hours. Thyroid Function Tests: No results for input(s): TSH, T4TOTAL, FREET4, T3FREE, THYROIDAB in the last 72 hours. Anemia Panel: Recent Labs    12/24/17 1515 12/25/17 1429  VITAMINB12 758  --   RETICCTPCT  --  5.2*   Sepsis Labs: Recent Labs  Lab 12/23/17 2052 12/23/17 2216 12/24/17 0638 12/25/17 0214 12/26/17 0458  PROCALCITON  --   --  0.24 0.71 1.50  LATICACIDVEN 4.83* 3.79* 1.5  --   --  Recent Results (from the past 240 hour(s))  Blood culture (routine x 2)     Status: None (Preliminary result)   Collection Time: 12/23/17  7:50 PM  Result Value Ref Range Status   Specimen Description BLOOD RIGHT HAND  Final   Special Requests   Final    IN PEDIATRIC BOTTLE Blood Culture results may not be optimal due to an excessive volume of blood received in culture bottles   Culture   Final    NO GROWTH 2 DAYS Performed at [facility], [place] [address] [place], [place] [address]    Report Status PENDING  Incomplete  Blood culture (routine x 2)     Status: None (Preliminary result)   Collection Time: 12/23/17  7:50 PM  Result Value Ref Range Status   Specimen Description BLOOD LEFT ANTECUBITAL  Final   Special Requests   Final    BOTTLES DRAWN AEROBIC AND ANAEROBIC Blood Culture adequate volume   Culture   Final    NO GROWTH 2 DAYS Performed at [facility], [place] [address] [place], [place] [address]    Report Status PENDING  Incomplete  C difficile quick scan w PCR reflex     Status: None   Collection Time: 12/24/17  2:53 AM  Result Value Ref Range Status   C Diff antigen NEGATIVE NEGATIVE Final   C Diff toxin NEGATIVE  NEGATIVE Final   C Diff interpretation No C. difficile detected.  Final    Comment: Performed at [facility], [place] [address], [place], [place] [address]  Urine culture     Status: None   Collection Time: 12/24/17  3:30 AM  Result Value Ref Range Status   Specimen Description URINE, RANDOM  Final   Special Requests NONE  Final   Culture   Final    NO GROWTH Performed at [facility], [place] [address], [place], [place] [address]    Report Status 12/25/2017 FINAL  Final  Respiratory Panel by PCR     Status: None   Collection Time: 12/24/17  6:20 AM  Result Value Ref Range Status   Adenovirus NOT DETECTED NOT DETECTED Final   Coronavirus 229E NOT DETECTED NOT DETECTED Final   Coronavirus HKU1 NOT DETECTED NOT DETECTED Final   Coronavirus NL63 NOT DETECTED NOT DETECTED Final   Coronavirus OC43 NOT DETECTED NOT DETECTED Final   Metapneumovirus NOT DETECTED NOT DETECTED Final   Rhinovirus / Enterovirus NOT DETECTED NOT DETECTED Final   Influenza A NOT DETECTED NOT DETECTED Final   Influenza B NOT DETECTED NOT DETECTED Final   Parainfluenza Virus 1 NOT DETECTED NOT DETECTED Final   Parainfluenza Virus 2 NOT DETECTED NOT DETECTED Final   Parainfluenza Virus 3 NOT DETECTED NOT DETECTED Final   Parainfluenza Virus 4 NOT DETECTED NOT DETECTED Final   Respiratory Syncytial Virus NOT DETECTED NOT DETECTED Final   Bordetella pertussis NOT DETECTED NOT DETECTED Final   Chlamydophila pneumoniae NOT DETECTED NOT DETECTED Final   Mycoplasma pneumoniae NOT DETECTED NOT DETECTED Final  MRSA PCR Screening     Status: None   Collection Time: 12/25/17  5:12 AM  Result Value Ref Range Status   MRSA by PCR NEGATIVE NEGATIVE Final    Comment:        The GeneXpert MRSA Assay (FDA approved for NASAL specimens only), is one component of a comprehensive MRSA colonization surveillance program. It is not intended to diagnose MRSA infection nor to guide or  monitor treatment for MRSA  infections. Performed at [facility], [place] [address], [place], [place] [address]          Radiology Studies: No results found.      Scheduled Meds: . colestipol  1 g Oral BID  . folic acid  1 mg Oral Daily  . hydrOXYzine  10 mg Oral BID  . levothyroxine  75 mcg Oral QAC breakfast  . lipase/protease/amylase  72,000 Units Oral TID WC  . LORazepam  0-4 mg Intravenous Q12H  . multivitamin with minerals  1 tablet Oral Daily  . nicotine  21 mg Transdermal Daily  . pantoprazole  40 mg Oral Q0600  . pregabalin  50 mg Oral TID  . thiamine  100 mg Oral Daily   Or  . thiamine  100 mg Intravenous Daily   Continuous Infusions: . sodium chloride    . piperacillin-tazobactam (ZOSYN)  IV 3.375 g (12/26/17 0516)     LOS: 2 days     Cordelia Poche, MD [facility] 12/26/2017, 9:13 AM Pager: [phone]  If 7PM-7AM, please contact night-coverage [email or web address] Password [email or web address] 12/26/2017, 9:13 AM

## 2017-12-26 NOTE — Progress Notes (Signed)
CRITICAL VALUE ALERT  Critical Value:  Vancomycin trough 56  Date & Time Notied: 12/26/17 at 0855  Provider Notified: Dr. Lonny Prude notified in person.  Orders Received/Actions taken: Order received not to give AM vancomycin.
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
## 2017-12-26 NOTE — Progress Notes (Signed)
Pharmacy Antibiotic Note Jamie Allen is a 47 y.o. female admitted on 12/23/2017 with sepsis.  Pharmacy following for Zosyn and vancomycin. Pt w/ recent h/o fecal transplant for C.diff. Case discussed with MD about risks/benefits of broad spectrum abx in patient with history of repeat C.diff. Given likely underlying infection and no clear source, will initiate broad spectrum abx for now.  SCr continues to increase jumping to 3.14 overnight. Vancomycin has been stopped. Culture data remains negative.   Plan: -Adjust Zosyn to 2.25 grams IV every 8 hours (infused over 30 minutes) based on renal function  -Follow up renal function and culture data and make further adjustments as needed   Height: 5\' 1"  (154.9 cm) Weight: 140 lb 14 oz (63.9 kg) IBW/kg (Calculated) : 47.8  Temp (24hrs), Avg:98 F (36.7 C), Min:98 F (36.7 C), Max:98.1 F (36.7 C)  Recent Labs  Lab 12/23/17 2036 12/23/17 2052 12/23/17 2216 12/24/17 9030 12/24/17 0946 12/24/17 1703 12/25/17 0214 12/26/17 0458 12/26/17 0743  WBC 4.7  --   --   --  3.3* 4.6 5.8 4.1  --   CREATININE 0.90  --   --  0.87  --   --  1.45* 3.14*  --   LATICACIDVEN  --  4.83* 3.79* 1.5  --   --   --   --   --   VANCOTROUGH  --   --   --   --   --   --   --   --  56*      Allergies  Allergen Reactions  . Iohexol Hives, Itching and Swelling  . Morphine And Related Itching and Other (See Comments)    Can tolerate with Benadryl    Antimicrobials this admission: 3/6 Vancomycin  >> 3/9  3/6 Zosyn >>   Microbiology results:  3/6 BCx: ngtd 3/7 Stool Cx: pending 3/7 Urine Cx: ngf  3/7 RVP: negative  3/8 MRSA PCR: negative 3/7 Cdiff PCR: negative (recently treated for Cdiff)  Thank you for allowing [name] to participate in this patients care.  Vincenza Hews, PharmD, BCPS 12/26/2017, 10:04 AM

## 2017-12-27 ENCOUNTER — Inpatient Hospital Stay (HOSPITAL_COMMUNITY): Payer: Medicaid Other

## 2017-12-27 LAB — TYPE AND SCREEN
ABO/RH(D): O NEG
ANTIBODY SCREEN: POSITIVE
Donor AG Type: NEGATIVE
Donor AG Type: NEGATIVE
Donor AG Type: NEGATIVE
Donor AG Type: NEGATIVE
UNIT DIVISION: 0
UNIT DIVISION: 0
Unit division: 0
Unit division: 0

## 2017-12-27 LAB — COMPREHENSIVE METABOLIC PANEL
ALK PHOS: 108 U/L (ref 38–126)
ALT: 20 U/L (ref 14–54)
AST: 46 U/L — AB (ref 15–41)
Albumin: 2.5 g/dL — ABNORMAL LOW (ref 3.5–5.0)
Anion gap: 12 (ref 5–15)
BILIRUBIN TOTAL: 3.9 mg/dL — AB (ref 0.3–1.2)
BUN: 16 mg/dL (ref 6–20)
CALCIUM: 7.9 mg/dL — AB (ref 8.9–10.3)
CHLORIDE: 103 mmol/L (ref 101–111)
CO2: 20 mmol/L — ABNORMAL LOW (ref 22–32)
CREATININE: 3.48 mg/dL — AB (ref 0.44–1.00)
GFR, EST AFRICAN AMERICAN: 17 mL/min — AB (ref >60)
GFR, EST NON AFRICAN AMERICAN: 15 mL/min — AB (ref >60)
Glucose, Bld: 79 mg/dL (ref 65–99)
Potassium: 3.9 mmol/L (ref 3.5–5.1)
Sodium: 135 mmol/L (ref 135–145)
TOTAL PROTEIN: 6.8 g/dL (ref 6.5–8.1)

## 2017-12-27 LAB — IRON AND TIBC
Iron: 294 ug/dL — ABNORMAL HIGH (ref 28–170)
Saturation Ratios: 167 % — ABNORMAL HIGH (ref 10.4–31.8)
TIBC: 176 ug/dL — ABNORMAL LOW (ref 250–450)

## 2017-12-27 LAB — CBC WITH DIFFERENTIAL/PLATELET
BASOS PCT: 1 %
Basophils Absolute: 0 10*3/uL (ref 0.0–0.1)
EOS PCT: 2 %
Eosinophils Absolute: 0.1 10*3/uL (ref 0.0–0.7)
HCT: 21.8 % — ABNORMAL LOW (ref 36.0–46.0)
HEMOGLOBIN: 7.3 g/dL — AB (ref 12.0–15.0)
Lymphocytes Relative: 26 %
Lymphs Abs: 0.9 10*3/uL (ref 0.7–4.0)
MCH: 36 pg — AB (ref 26.0–34.0)
MCHC: 33.5 g/dL (ref 30.0–36.0)
MCV: 107.4 fL — AB (ref 78.0–100.0)
MONOS PCT: 7 %
Monocytes Absolute: 0.2 10*3/uL (ref 0.1–1.0)
NEUTROS PCT: 64 %
Neutro Abs: 2.3 10*3/uL (ref 1.7–7.7)
Platelets: 63 10*3/uL — ABNORMAL LOW (ref 150–400)
RBC: 2.03 MIL/uL — AB (ref 3.87–5.11)
WBC: 3.5 10*3/uL — AB (ref 4.0–10.5)

## 2017-12-27 LAB — BPAM RBC
Blood Product Expiration Date: 201903292359
Blood Product Expiration Date: 201903312359
Blood Product Expiration Date: 201904012359
Blood Product Expiration Date: 201904012359
ISSUE DATE / TIME: 201903070026
ISSUE DATE / TIME: 201903070240
ISSUE DATE / TIME: 201903071825
UNIT TYPE AND RH: 9500
UNIT TYPE AND RH: 9500
Unit Type and Rh: 9500
Unit Type and Rh: 9500

## 2017-12-27 LAB — RETICULOCYTES
RBC.: 2.03 MIL/uL — ABNORMAL LOW (ref 3.87–5.11)
RETIC COUNT ABSOLUTE: 138 10*3/uL (ref 19.0–186.0)
Retic Ct Pct: 6.8 % — ABNORMAL HIGH (ref 0.4–3.1)

## 2017-12-27 LAB — HIV ANTIBODY (ROUTINE TESTING W REFLEX): HIV Screen 4th Generation wRfx: NONREACTIVE

## 2017-12-27 LAB — GLUCOSE, CAPILLARY: GLUCOSE-CAPILLARY: 90 mg/dL (ref 65–99)

## 2017-12-27 LAB — FERRITIN: Ferritin: 726 ng/mL — ABNORMAL HIGH (ref 11–307)

## 2017-12-27 LAB — FOLATE: FOLATE: 19.6 ng/mL (ref >5.9)

## 2017-12-27 LAB — VITAMIN B12: VITAMIN B 12: 886 pg/mL (ref 180–914)

## 2017-12-27 MED ORDER — CEFPODOXIME PROXETIL 200 MG PO TABS
200.0000 mg | ORAL_TABLET | Freq: Every day | ORAL | Status: DC
  Administered 2017-12-27 – 2017-12-30 (×4): 200 mg via ORAL
  Filled 2017-12-27 (×4): qty 1

## 2017-12-27 NOTE — Progress Notes (Deleted)
Pt's meds were crushed in applesauce. Pt took 1 bite and refused to take the rest. Will continue to assess.

## 2017-12-27 NOTE — Plan of Care (Signed)
Progressing

## 2017-12-27 NOTE — Progress Notes (Signed)
PROGRESS NOTE    Jamie Allen  TGG:[id number] DOB: Oct 08, 1971 DOA: 12/23/2017 PCP: Ladell Pier, MD   Brief Narrative: Jamie Allen is a 47 y.o. 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 She presents with fevers/chills/nausea and vomiting concerning for sepsis with shock. Also with significant anemia of unknown cause. S/p 3 units of PRBC to date.   Assessment & Plan:   Principal Problem:   Septic shock ([place]) Active Problems:   Anxiety and depression   Hypothyroidism   GERD (gastroesophageal reflux disease)   Abdominal pain   C. difficile enteritis   Alcoholic cirrhosis of liver (HCC)   Tobacco abuse   Macrocytic anemia   Thrombocytopenia (HCC)   Alcohol abuse   Septic shock Unknown source. Initially thought GI but workup so far is negative. Initially required Levophed which was weaned off. Evaluated by PCCM on admission. No ascites on CT.  Abdomen. Resolved. Still no source. CRP and sed rate slightly elevated. Procalcitonin elevated and trending up. Urine culture negative, however, she obtained antibiotics prior to culture sample. No ascites fluid for sampling. Blood culture no growth to date -Blood culture pending -Switch to Vantin (renally adjust as kidney function changes)  History of C. Difficile enteritis C. Diff negative.  Alcoholic cirrhosis MELD score of 21. Patient reports last drink was a week ago. AST/ALT stable. Bilirubin significantly elevated. Ammonia wnl. Bilirubin trending down -GI recommendations  Alcohol abuse No withdrawal symptoms -CIWA  Macrocytic anemia Recent rectal bleeding. S/p 2 units PRBC 3/6 and another 1 unit on 3/7. Stable currently but not a great response with the 1 unit. Has a recent history of transfusion 1-2 months  ago. Haptoglobin low. LDH within normal limits. Folate and B12 normal. MCV improved from admission. Hemoglobin stable. ?TTP. DAT negative. FOBT negative. Hematology leaning more towards iron deficiency anemia in setting of cirrhosis/splenomegaly. S/p IV iron. Anemia panel obtained after IV iron and is not accurate. -hematology recommendations: G6PD, Epo, Cold agglutinin titer -Transfuse blood for hemoglobin <7  Tobacco abuse -Continue nicotine patch  Thrombocytopenia Secondary to cirrhosis. Stable.  GERD -Continue Protonix  Hypothyroidism -Continue Synthroid  Depression/Anxiety Not currently on medications  Acute kidney injury Vancomycin-induced nephrotoxicity Obtained vancomycin trough this morning for concern of vanc nephrotoxicity. Vanc trough resulted and is 56 (elevated). Vancomycin discontinued. No hydronephrosis on renal ultrasound. Creatinine stable. UOP over last 24 hours: 2.1L -Daily BMP -No nephrotoxic agents -If creatinine continues to rise, will consult nephrology.  RLQ/pelvic pain Unremarkable CT scan. -Vaginal ultrasound to rule out cyst/torsion   DVT prophylaxis: SCDs Code Status:   Code Status: Full Code Family Communication: None at bedside Disposition Plan: TBD   Consultants:   [facility]  Procedures:   2 units PRBC (12/23/17)  1 unit PRBC (12/24/17)  Antimicrobials:  Vancomycin  Zosyn    Subjective: Right lower quadrant/right pelvic, sharp pain which is worse with movement.  Objective: Vitals:   12/27/17 0000 12/27/17 0017 12/27/17 0428 12/27/17 0500  BP: (!) 102/52   (!) 92/52  Pulse: 82   80  Resp: 16   16  Temp:  98.8 F (37.1 C) 98.4 F (36.9 C)   TempSrc:  Oral Oral   SpO2: 93%   92%  Weight:      Height:        Intake/Output Summary (Last 24 hours) at 12/27/2017  2951 Last data filed at 12/27/2017 8841 Gross per 24 hour  Intake 220 ml  Output 2100 ml  Net -1880 ml   Filed Weights   12/23/17 2019 12/24/17 1634  Weight:  60.3 kg (133 lb) 63.9 kg (140 lb 14 oz)    Examination:  General exam: Calm and comfortable.  Respiratory system: Clear to auscultation. Respiratory effort normal. Cardiovascular system: Regular rate and rhythm. Normal S1 and S2. No heart murmurs present. No extra heart sounds Gastrointestinal system: Abdomen is slightly distended, soft and mildly tender in RLQ with no rebound or gaurding. Increased bowel sounds heard. Central nervous system: Alert, oriented, no focal deficits Extremities: No edema or calf tenderness Skin: No cyanosis. No rashes. Mild jaundice of forehead Psychiatry: Judgement/insight normal. Affect and mood normal    Data Reviewed: I have personally reviewed following labs and imaging studies  CBC: Recent Labs  Lab 12/23/17 2036  12/24/17 0946  12/24/17 1703 12/25/17 0214 12/25/17 1429 12/26/17 0458 12/27/17 0605  WBC 4.7  --  3.3*  --  4.6 5.8  --  4.1 3.5*  NEUTROABS 3.9  --   --   --   --   --   --   --  2.3  HGB 5.4*  --  6.7*   < > 6.6* 7.0* 7.3* 7.1* 7.3*  HCT 15.8*  --  20.4*   < > 19.9* 21.0* 21.4* 21.0* 21.8*  MCV 124.4*  --  106.8*  --  106.4* 105.0*  --  105.5* 107.4*  PLT 65*   < > 59*  --  60* 52*  --  47* 63*   < > = values in this interval not displayed.   Basic Metabolic Panel: Recent Labs  Lab 12/23/17 2036 12/24/17 6606 12/25/17 0214 12/26/17 0458 12/27/17 0605  NA 132* 139 132* 131* 135  K 3.8 3.5 3.5 4.2 3.9  CL 98* 107 103 99* 103  CO2 20* 22 19* 20* 20*  GLUCOSE 136* 89 101* 89 79  BUN <5* 5* 7 19 16   CREATININE 0.90 0.87 1.45* 3.14* 3.48*  CALCIUM 8.8* 7.9* 7.4* 7.6* 7.9*   GFR: Estimated Creatinine Clearance: 17.1 mL/min (A) (by C-G formula based on SCr of 3.48 mg/dL (H)). Liver Function Tests: Recent Labs  Lab 12/23/17 2036 12/24/17 3016 12/24/17 0946 12/25/17 0214 12/26/17 0458 12/27/17 0605  AST 49* 44*  --  41 56* 46*  ALT 19 19  --  17 20 20   ALKPHOS 135* 117  --  127* 108 108  BILITOT 5.5* 8.6* 8.2*  6.4* 5.0* 3.9*  PROT 7.6 6.9  --  6.4* 6.8 6.8  ALBUMIN 3.0* 2.8*  --  2.6* 2.5* 2.5*   Recent Labs  Lab 12/23/17 2036  LIPASE 33   Recent Labs  Lab 12/23/17 2320  AMMONIA 24   Coagulation Profile: Recent Labs  Lab 12/23/17 2036 12/23/17 2253 12/24/17 0638 12/26/17 0458  INR 1.64 1.80 1.84 1.59   Cardiac Enzymes: No results for input(s): CKTOTAL, CKMB, CKMBINDEX, TROPONINI in the last 168 hours. BNP (last 3 results) No results for input(s): PROBNP in the last 8760 hours. HbA1C: No results for input(s): HGBA1C in the last 72 hours. CBG: Recent Labs  Lab 12/24/17 0753 12/25/17 0759 12/26/17 0745 12/27/17 0824  GLUCAP 72 76 90 90   Lipid Profile: No results for input(s): CHOL, HDL, LDLCALC, TRIG, CHOLHDL, LDLDIRECT in the last 72 hours. Thyroid Function Tests: No results for input(s): TSH, T4TOTAL, FREET4, T3FREE, THYROIDAB in the  last 72 hours. Anemia Panel: Recent Labs    12/24/17 1515  12/26/17 1719 12/27/17 0605  VITAMINB12 758  --   --  886  FOLATE  --   --   --  19.6  FERRITIN  --   --   --  726*  TIBC  --   --   --  176*  IRON  --   --   --  294*  RETICCTPCT  --    < > 5.4* 6.8*   < > = values in this interval not displayed.   Sepsis Labs: Recent Labs  Lab 12/23/17 2052 12/23/17 2216 12/24/17 2951 12/25/17 0214 12/26/17 0458  PROCALCITON  --   --  0.24 0.71 1.50  LATICACIDVEN 4.83* 3.79* 1.5  --   --     Recent Results (from the past 240 hour(s))  Blood culture (routine x 2)     Status: None (Preliminary result)   Collection Time: 12/23/17  7:50 PM  Result Value Ref Range Status   Specimen Description BLOOD RIGHT HAND  Final   Special Requests   Final    IN PEDIATRIC BOTTLE Blood Culture results may not be optimal due to an excessive volume of blood received in culture bottles   Culture   Final    NO GROWTH 3 DAYS Performed at [facility], [place] [address] [place], [place] [address]    Report Status PENDING  Incomplete  Blood  culture (routine x 2)     Status: None (Preliminary result)   Collection Time: 12/23/17  7:50 PM  Result Value Ref Range Status   Specimen Description BLOOD LEFT ANTECUBITAL  Final   Special Requests   Final    BOTTLES DRAWN AEROBIC AND ANAEROBIC Blood Culture adequate volume   Culture   Final    NO GROWTH 3 DAYS Performed at [facility], [place] [address] [place], [place] [address]    Report Status PENDING  Incomplete  Stool culture (children & immunocomp patients)     Status: None (Preliminary result)   Collection Time: 12/24/17  2:53 AM  Result Value Ref Range Status   Salmonella/Shigella Screen Final report  Final   Campylobacter Culture PENDING  Incomplete   E coli, Shiga toxin Assay Negative Negative Final    Comment: (NOTE) Performed At: [facility] [place], [place] [id number] Rush Farmer MD [id number] Performed at [facility], [place] [address] [place], [place] [address]   C difficile quick scan w PCR reflex     Status: None   Collection Time: 12/24/17  2:53 AM  Result Value Ref Range Status   C Diff antigen NEGATIVE NEGATIVE Final   C Diff toxin NEGATIVE NEGATIVE Final   C Diff interpretation No C. difficile detected.  Final    Comment: Performed at [facility], [place] [address] [place], [place] [address]  STOOL CULTURE REFLEX - RSASHR     Status: None   Collection Time: 12/24/17  2:53 AM  Result Value Ref Range Status   Stool Culture result 1 (RSASHR) Comment  Final    Comment: (NOTE) No Salmonella or Shigella recovered. Performed At: [facility] [place], [place] [id number] Rush Farmer MD [id number] Performed at [facility], [place] [address], [place],  [address]   Urine culture     Status: None   Collection Time: 12/24/17  3:30 AM  Result Value Ref Range Status  Specimen Description URINE, RANDOM  Final   Special Requests NONE  Final   Culture   Final    NO  GROWTH Performed at [facility], [place] [address], [place], [place] [address]    Report Status 12/25/2017 FINAL  Final  Respiratory Panel by PCR     Status: None   Collection Time: 12/24/17  6:20 AM  Result Value Ref Range Status   Adenovirus NOT DETECTED NOT DETECTED Final   Coronavirus 229E NOT DETECTED NOT DETECTED Final   Coronavirus HKU1 NOT DETECTED NOT DETECTED Final   Coronavirus NL63 NOT DETECTED NOT DETECTED Final   Coronavirus OC43 NOT DETECTED NOT DETECTED Final   Metapneumovirus NOT DETECTED NOT DETECTED Final   Rhinovirus / Enterovirus NOT DETECTED NOT DETECTED Final   Influenza A NOT DETECTED NOT DETECTED Final   Influenza B NOT DETECTED NOT DETECTED Final   Parainfluenza Virus 1 NOT DETECTED NOT DETECTED Final   Parainfluenza Virus 2 NOT DETECTED NOT DETECTED Final   Parainfluenza Virus 3 NOT DETECTED NOT DETECTED Final   Parainfluenza Virus 4 NOT DETECTED NOT DETECTED Final   Respiratory Syncytial Virus NOT DETECTED NOT DETECTED Final   Bordetella pertussis NOT DETECTED NOT DETECTED Final   Chlamydophila pneumoniae NOT DETECTED NOT DETECTED Final   Mycoplasma pneumoniae NOT DETECTED NOT DETECTED Final  MRSA PCR Screening     Status: None   Collection Time: 12/25/17  5:12 AM  Result Value Ref Range Status   MRSA by PCR NEGATIVE NEGATIVE Final    Comment:        The GeneXpert MRSA Assay (FDA approved for NASAL specimens only), is one component of a comprehensive MRSA colonization surveillance program. It is not intended to diagnose MRSA infection nor to guide or monitor treatment for MRSA infections. Performed at [facility], [place] [address], [place], [place] [address]          Radiology Studies: US Renal  Result Date: 12/26/2017 CLINICAL DATA:  Acute kidney injury EXAM: RENAL / URINARY TRACT ULTRASOUND COMPLETE COMPARISON:  None. FINDINGS: Right Kidney: Length: 12.8 cm. Echogenicity within normal limits. No mass or hydronephrosis visualized.  Trace perinephric fluid adjacent to the upper pole. Left Kidney: Length: 9.8 cm. Atrophic upper pole, compatible with the appearance on CT of 12/24/2017. Echogenicity within normal limits. No mass or hydronephrosis visualized. Bladder: Decompressed by Foley catheter. Splenomegaly, also demonstrated on earlier CT. IMPRESSION: 1. No acute findings. No hydronephrosis. Trace perinephric fluid adjacent to the upper pole of the right kidney is of uncertain significance. 2. Partially atrophic (upper pole) left kidney, as seen on multiple prior abdomen CTs. 3. Splenomegaly, as also described on previous CT (likely related to previously described cirrhosis). Electronically Signed   By: Franki Cabot M.D.   On: 12/26/2017 15:22        Scheduled Meds: . colestipol  1 g Oral BID  . folic acid  1 mg Oral Daily  . hydrOXYzine  10 mg Oral BID  . levothyroxine  75 mcg Oral QAC breakfast  . lipase/protease/amylase  72,000 Units Oral TID WC  . LORazepam  0-4 mg Intravenous Q12H  . multivitamin with minerals  1 tablet Oral Daily  . nicotine  21 mg Transdermal Daily  . pantoprazole  40 mg Oral Q0600  . pregabalin  50 mg Oral TID  . thiamine  100 mg Oral Daily   Or  . thiamine  100 mg Intravenous Daily   Continuous Infusions: . sodium chloride    .  piperacillin-tazobactam (ZOSYN)  IV Stopped (12/27/17 0600)     LOS: 3 days     Cordelia Poche, MD [facility] 12/27/2017, 9:10 AM Pager: [phone]  If 7PM-7AM, please contact night-coverage [email or web address] Password [email or web address] 12/27/2017, 9:10 AM

## 2017-12-28 ENCOUNTER — Inpatient Hospital Stay (HOSPITAL_COMMUNITY): Payer: Medicaid Other

## 2017-12-28 LAB — STOOL CULTURE REFLEX - CMPCXR

## 2017-12-28 LAB — GLUCOSE, CAPILLARY: GLUCOSE-CAPILLARY: 93 mg/dL (ref 65–99)

## 2017-12-28 LAB — CULTURE, BLOOD (ROUTINE X 2)
Culture: NO GROWTH
Culture: NO GROWTH
Special Requests: ADEQUATE

## 2017-12-28 LAB — COMPREHENSIVE METABOLIC PANEL
ALT: 19 U/L (ref 14–54)
ANION GAP: 14 (ref 5–15)
AST: 47 U/L — ABNORMAL HIGH (ref 15–41)
Albumin: 2.8 g/dL — ABNORMAL LOW (ref 3.5–5.0)
Alkaline Phosphatase: 122 U/L (ref 38–126)
BUN: 18 mg/dL (ref 6–20)
CHLORIDE: 104 mmol/L (ref 101–111)
CO2: 18 mmol/L — AB (ref 22–32)
Calcium: 8.4 mg/dL — ABNORMAL LOW (ref 8.9–10.3)
Creatinine, Ser: 3.09 mg/dL — ABNORMAL HIGH (ref 0.44–1.00)
GFR, EST AFRICAN AMERICAN: 20 mL/min — AB (ref >60)
GFR, EST NON AFRICAN AMERICAN: 17 mL/min — AB (ref >60)
Glucose, Bld: 76 mg/dL (ref 65–99)
POTASSIUM: 3.8 mmol/L (ref 3.5–5.1)
SODIUM: 136 mmol/L (ref 135–145)
Total Bilirubin: 3.9 mg/dL — ABNORMAL HIGH (ref 0.3–1.2)
Total Protein: 7.5 g/dL (ref 6.5–8.1)

## 2017-12-28 LAB — CBC WITH DIFFERENTIAL/PLATELET
BASOS PCT: 1 %
Basophils Absolute: 0 10*3/uL (ref 0.0–0.1)
EOS ABS: 0.1 10*3/uL (ref 0.0–0.7)
Eosinophils Relative: 2 %
HCT: 23.6 % — ABNORMAL LOW (ref 36.0–46.0)
Hemoglobin: 7.9 g/dL — ABNORMAL LOW (ref 12.0–15.0)
LYMPHS ABS: 1 10*3/uL (ref 0.7–4.0)
Lymphocytes Relative: 26 %
MCH: 35.9 pg — AB (ref 26.0–34.0)
MCHC: 33.5 g/dL (ref 30.0–36.0)
MCV: 107.3 fL — ABNORMAL HIGH (ref 78.0–100.0)
MONO ABS: 0.2 10*3/uL (ref 0.1–1.0)
Monocytes Relative: 6 %
NEUTROS ABS: 2.4 10*3/uL (ref 1.7–7.7)
Neutrophils Relative %: 65 %
PLATELETS: 86 10*3/uL — AB (ref 150–400)
RBC: 2.2 MIL/uL — ABNORMAL LOW (ref 3.87–5.11)
WBC: 3.7 10*3/uL — ABNORMAL LOW (ref 4.0–10.5)

## 2017-12-28 LAB — COLD AGGLUTININ TITER: COLD AGGLUTININ TITER: NEGATIVE (ref <1:32)

## 2017-12-28 LAB — STOOL CULTURE: E COLI SHIGA TOXIN ASSAY: NEGATIVE

## 2017-12-28 LAB — ERYTHROPOIETIN: ERYTHROPOIETIN: 42.8 m[IU]/mL — AB (ref 2.6–18.5)

## 2017-12-28 LAB — STOOL CULTURE REFLEX - RSASHR

## 2017-12-28 MED ORDER — PREGABALIN 25 MG PO CAPS
50.0000 mg | ORAL_CAPSULE | Freq: Two times a day (BID) | ORAL | Status: DC
  Administered 2017-12-28 – 2018-01-08 (×23): 50 mg via ORAL
  Filled 2017-12-28 (×23): qty 2

## 2017-12-28 NOTE — Progress Notes (Signed)
PROGRESS NOTE    Jamie Allen  ZOX:[id number] DOB: [birth year] DOA: 12/23/2017 PCP: Ladell Pier, MD   Brief Narrative: Jamie Allen is a 47 y.o. 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 She presents with fevers/chills/nausea and vomiting concerning for sepsis with shock. Also with significant anemia of unknown cause. S/p 3 units of PRBC to date.   Assessment & Plan:   Principal Problem:   Septic shock ([facility]) Active Problems:   Anxiety and depression   Hypothyroidism   GERD (gastroesophageal reflux disease)   Abdominal pain   C. difficile enteritis   Alcoholic cirrhosis of liver (HCC)   Tobacco abuse   Macrocytic anemia   Thrombocytopenia (HCC)   Alcohol abuse   Septic shock Unknown source. Initially thought GI but workup so far is negative. Initially required Levophed which was weaned off. Evaluated by PCCM on admission. No ascites on CT.  Abdomen. Resolved. Still no source. CRP and sed rate slightly elevated. Procalcitonin elevated and trending up. Urine culture negative, however, she obtained antibiotics prior to culture sample. No ascites fluid for sampling. Blood culture no growth to date -Blood culture pending -Switch to Vantin (renally adjust as kidney function changes)  History of C. Difficile enteritis C. Diff negative.  Alcoholic cirrhosis MELD score of 21. Patient reports last drink was a week ago. AST/ALT stable. Bilirubin significantly elevated. Ammonia wnl. Bilirubin trending down -GI recommendations  Alcohol abuse No withdrawal symptoms -CIWA  Macrocytic anemia Recent rectal bleeding. S/p 2 units PRBC 3/6 and another 1 unit on 3/7. Stable currently but not a great response with the 1 unit. Has a recent history of transfusion 1-2 months  ago. Haptoglobin low. LDH within normal limits. Folate and B12 normal. MCV improved from admission. Hemoglobin stable. ?TTP. DAT negative. FOBT negative. Hematology leaning more towards iron deficiency anemia in setting of cirrhosis/splenomegaly. S/p IV iron. Anemia panel obtained after IV iron and is not accurate. Hemoglobin stable. -hematology recommendations: G6PD, Epo, Cold agglutinin titer -Transfuse blood for hemoglobin <7  Tobacco abuse -Continue nicotine patch  Thrombocytopenia Secondary to cirrhosis. Improving.  GERD -Continue Protonix  Hypothyroidism -Continue Synthroid  Depression/Anxiety -Decrease frequency of Lyrica to BID in setting of AKI. May need further dose adjustments  Acute kidney injury Vancomycin-induced nephrotoxicity Obtained vancomycin trough this morning for concern of vanc nephrotoxicity. Vanc trough resulted and is 56 (elevated). Vancomycin discontinued. No hydronephrosis on renal ultrasound. Creatinine starting to improve. UOP over last 24 hours: 1.5 L -Daily BMP -No nephrotoxic agents -Lyrica dose adjusted  RLQ/pelvic pain Unremarkable CT scan. Pelvic ultrasound with some fluid, but otherwise unremarkable.  Abdominal distension -Abdominal x-ray   DVT prophylaxis: SCDs Code Status:   Code Status: Full Code Family Communication: None at bedside Disposition Plan: TBD   Consultants:    GI  Procedures:   2 units PRBC (12/23/17)  1 unit PRBC (12/24/17)  Antimicrobials:  Vancomycin  Zosyn    Subjective: RLQ/pelvic pain still present. Having bowel movements. Worsening distension.  Objective: Vitals:   12/27/17 0500 12/27/17 1541 12/27/17 2041 12/28/17 0518  BP: (!) 92/52 (!) 107/50 (!) 100/54 120/61  Pulse: 80 86 84 84  Resp: 16 16 18 17   Temp:  98.1 F (36.7 C) 98.6 F (37 C) 99 F (37.2 C)  TempSrc:  Oral Oral Oral  SpO2: 92% 99% 99% 99%  Weight:  Height:        Intake/Output Summary (Last 24 hours) at  12/28/2017 0638 Last data filed at 12/28/2017 0558 Gross per 24 hour  Intake 300 ml  Output 1500 ml  Net -1200 ml   Filed Weights   12/23/17 2019 12/24/17 1634  Weight: 60.3 kg (133 lb) 63.9 kg (140 lb 14 oz)    Examination:  General exam: Calm and comfortable.  Respiratory system: Clear to auscultation. Respiratory effort normal. Cardiovascular system: Regular rate and rhythm. Normal S1 and S2. No heart murmurs present. No extra heart sounds Gastrointestinal system: Abdomen is more distended today, soft and mildly tender in RLQ with no rebound or gaurding. Increased bowel sounds heard. Central nervous system: Alert, oriented, no focal deficits Extremities: No edema or calf tenderness Skin: No cyanosis. No rashes. Mild jaundice of forehead Psychiatry: Judgement/insight normal. Affect and mood normal    Data Reviewed: I have personally reviewed following labs and imaging studies  CBC: Recent Labs  Lab 12/23/17 2036  12/24/17 0946  12/24/17 1703 12/25/17 0214 12/25/17 1429 12/26/17 0458 12/27/17 0605  WBC 4.7  --  3.3*  --  4.6 5.8  --  4.1 3.5*  NEUTROABS 3.9  --   --   --   --   --   --   --  2.3  HGB 5.4*  --  6.7*   < > 6.6* 7.0* 7.3* 7.1* 7.3*  HCT 15.8*  --  20.4*   < > 19.9* 21.0* 21.4* 21.0* 21.8*  MCV 124.4*  --  106.8*  --  106.4* 105.0*  --  105.5* 107.4*  PLT 65*   < > 59*  --  60* 52*  --  47* 63*   < > = values in this interval not displayed.   Basic Metabolic Panel: Recent Labs  Lab 12/23/17 2036 12/24/17 0300 12/25/17 0214 12/26/17 0458 12/27/17 0605  NA 132* 139 132* 131* 135  K 3.8 3.5 3.5 4.2 3.9  CL 98* 107 103 99* 103  CO2 20* 22 19* 20* 20*  GLUCOSE 136* 89 101* 89 79  BUN <5* 5* 7 19 16   CREATININE 0.90 0.87 1.45* 3.14* 3.48*  CALCIUM 8.8* 7.9* 7.4* 7.6* 7.9*   GFR: Estimated Creatinine Clearance: 17.1 mL/min (A) (by C-G formula based on SCr of 3.48 mg/dL (H)). Liver Function Tests: Recent Labs  Lab 12/23/17 2036 12/24/17 9233  12/24/17 0946 12/25/17 0214 12/26/17 0458 12/27/17 0605  AST 49* 44*  --  41 56* 46*  ALT 19 19  --  17 20 20   ALKPHOS 135* 117  --  127* 108 108  BILITOT 5.5* 8.6* 8.2* 6.4* 5.0* 3.9*  PROT 7.6 6.9  --  6.4* 6.8 6.8  ALBUMIN 3.0* 2.8*  --  2.6* 2.5* 2.5*   Recent Labs  Lab 12/23/17 2036  LIPASE 33   Recent Labs  Lab 12/23/17 2320  AMMONIA 24   Coagulation Profile: Recent Labs  Lab 12/23/17 2036 12/23/17 2253 12/24/17 0638 12/26/17 0458  INR 1.64 1.80 1.84 1.59   Cardiac Enzymes: No results for input(s): CKTOTAL, CKMB, CKMBINDEX, TROPONINI in the last 168 hours. BNP (last 3 results) No results for input(s): PROBNP in the last 8760 hours. HbA1C: No results for input(s): HGBA1C in the last 72 hours. CBG: Recent Labs  Lab 12/24/17 0753 12/25/17 0759 12/26/17 0745 12/27/17 0824  GLUCAP 72 76 90 90   Lipid Profile: No results for input(s): CHOL, HDL, LDLCALC, TRIG, CHOLHDL, LDLDIRECT in the last  72 hours. Thyroid Function Tests: No results for input(s): TSH, T4TOTAL, FREET4, T3FREE, THYROIDAB in the last 72 hours. Anemia Panel: Recent Labs    12/26/17 1719 12/27/17 0605  VITAMINB12  --  886  FOLATE  --  19.6  FERRITIN  --  726*  TIBC  --  176*  IRON  --  294*  RETICCTPCT 5.4* 6.8*   Sepsis Labs: Recent Labs  Lab 12/23/17 2052 12/23/17 2216 12/24/17 0638 12/25/17 0214 12/26/17 0458  PROCALCITON  --   --  0.24 0.71 1.50  LATICACIDVEN 4.83* 3.79* 1.5  --   --     Recent Results (from the past 240 hour(s))  Blood culture (routine x 2)     Status: None (Preliminary result)   Collection Time: 12/23/17  7:50 PM  Result Value Ref Range Status   Specimen Description BLOOD RIGHT HAND  Final   Special Requests   Final    IN PEDIATRIC BOTTLE Blood Culture results may not be optimal due to an excessive volume of blood received in culture bottles   Culture   Final    NO GROWTH 4 DAYS Performed at [facility] [address], [place], [place]  [address]    Report Status PENDING  Incomplete  Blood culture (routine x 2)     Status: None (Preliminary result)   Collection Time: 12/23/17  7:50 PM  Result Value Ref Range Status   Specimen Description BLOOD LEFT ANTECUBITAL  Final   Special Requests   Final    BOTTLES DRAWN AEROBIC AND ANAEROBIC Blood Culture adequate volume   Culture   Final    NO GROWTH 4 DAYS Performed at [facility], [place] [address], [place], [place] [address]    Report Status PENDING  Incomplete  Stool culture (children & immunocomp patients)     Status: None (Preliminary result)   Collection Time: 12/24/17  2:53 AM  Result Value Ref Range Status   Salmonella/Shigella Screen Final report  Final   Campylobacter Culture PENDING  Incomplete   E coli, Shiga toxin Assay Negative Negative Final    Comment: (NOTE) Performed At: [facility] [place], [place] [id number] Rush Farmer MD [id number] Performed at [facility], [place] [address], [place], [place] [address]   C difficile quick scan w PCR reflex     Status: None   Collection Time: 12/24/17  2:53 AM  Result Value Ref Range Status   C Diff antigen NEGATIVE NEGATIVE Final   C Diff toxin NEGATIVE NEGATIVE Final   C Diff interpretation No C. difficile detected.  Final    Comment: Performed at [facility], [place] [address] [place], [place] [address]  STOOL CULTURE REFLEX - RSASHR     Status: None   Collection Time: 12/24/17  2:53 AM  Result Value Ref Range Status   Stool Culture result 1 (RSASHR) Comment  Final    Comment: (NOTE) No Salmonella or Shigella recovered. Performed At: [facility] [place], [place] [id number] Rush Farmer MD [id number] Performed at [facility], [place] [address], [place],  [address]   Urine culture     Status: None   Collection Time: 12/24/17  3:30 AM  Result Value Ref Range Status   Specimen Description URINE, RANDOM  Final   Special  Requests NONE  Final   Culture   Final    NO GROWTH Performed at [facility], [place]  [address], [place], [place] [address]    Report Status 12/25/2017 FINAL  Final  Respiratory Panel by PCR     Status: None   Collection Time: 12/24/17  6:20 AM  Result Value Ref Range Status   Adenovirus NOT DETECTED NOT DETECTED Final   Coronavirus 229E NOT DETECTED NOT DETECTED Final   Coronavirus HKU1 NOT DETECTED NOT DETECTED Final   Coronavirus NL63 NOT DETECTED NOT DETECTED Final   Coronavirus OC43 NOT DETECTED NOT DETECTED Final   Metapneumovirus NOT DETECTED NOT DETECTED Final   Rhinovirus / Enterovirus NOT DETECTED NOT DETECTED Final   Influenza A NOT DETECTED NOT DETECTED Final   Influenza B NOT DETECTED NOT DETECTED Final   Parainfluenza Virus 1 NOT DETECTED NOT DETECTED Final   Parainfluenza Virus 2 NOT DETECTED NOT DETECTED Final   Parainfluenza Virus 3 NOT DETECTED NOT DETECTED Final   Parainfluenza Virus 4 NOT DETECTED NOT DETECTED Final   Respiratory Syncytial Virus NOT DETECTED NOT DETECTED Final   Bordetella pertussis NOT DETECTED NOT DETECTED Final   Chlamydophila pneumoniae NOT DETECTED NOT DETECTED Final   Mycoplasma pneumoniae NOT DETECTED NOT DETECTED Final  MRSA PCR Screening     Status: None   Collection Time: 12/25/17  5:12 AM  Result Value Ref Range Status   MRSA by PCR NEGATIVE NEGATIVE Final    Comment:        The GeneXpert MRSA Assay (FDA approved for NASAL specimens only), is one component of a comprehensive MRSA colonization surveillance program. It is not intended to diagnose MRSA infection nor to guide or monitor treatment for MRSA infections. Performed at [facility], [place] [address] [place], [place] [address]          Radiology Studies: US Renal  Result Date: 12/26/2017 CLINICAL DATA:  Acute kidney injury EXAM: RENAL / URINARY TRACT ULTRASOUND COMPLETE COMPARISON:  None. FINDINGS: Right Kidney: Length: 12.8 cm. Echogenicity within normal  limits. No mass or hydronephrosis visualized. Trace perinephric fluid adjacent to the upper pole. Left Kidney: Length: 9.8 cm. Atrophic upper pole, compatible with the appearance on CT of 12/24/2017. Echogenicity within normal limits. No mass or hydronephrosis visualized. Bladder: Decompressed by Foley catheter. Splenomegaly, also demonstrated on earlier CT. IMPRESSION: 1. No acute findings. No hydronephrosis. Trace perinephric fluid adjacent to the upper pole of the right kidney is of uncertain significance. 2. Partially atrophic (upper pole) left kidney, as seen on multiple prior abdomen CTs. 3. Splenomegaly, as also described on previous CT (likely related to previously described cirrhosis). Electronically Signed   By: Franki Cabot M.D.   On: 12/26/2017 15:22   US Pelvic Complete With Transvaginal  Result Date: 12/27/2017 CLINICAL DATA:  Bilateral pelvic pain for 1 year EXAM: TRANSABDOMINAL AND TRANSVAGINAL ULTRASOUND OF PELVIS TECHNIQUE: Both transabdominal and transvaginal ultrasound examinations of the pelvis were performed. Transabdominal technique was performed for global imaging of the pelvis including uterus, ovaries, adnexal regions, and pelvic cul-de-sac. It was necessary to proceed with endovaginal exam following the transabdominal exam to visualize the endometrial complex and adnexal structures. COMPARISON:  None FINDINGS: Uterus Measurements: 5.4 x 3.1 x 3.6 cm. No fibroids or other mass visualized. Endometrium Thickness: 4 mm.  No focal abnormality visualized. Right ovary Not seen. No mass appreciated in the right adnexa. Small amount of simple appearing free fluid in the right adnexa, likely physiologic. Left ovary Measurements: 2.7 x 1.6 x 1.7 cm. Normal appearance/no adnexal mass. Trace free fluid in the left adnexa, also likely physiologic. Other findings  No abnormal free fluid. IMPRESSION: 1. Overall, no acute or significant findings. 2. Uterus appears normal. 3. Left ovary appears normal.  4. Right ovary is not seen but there is no mass or other significant abnormality in the right adnexal region. 5. Small amount of simple appearing free fluid in the adnexal regions, right greater than left, likely physiologic. Electronically Signed   By: Franki Cabot M.D.   On: 12/27/2017 10:48        Scheduled Meds: . cefpodoxime  200 mg Oral Daily  . colestipol  1 g Oral BID  . folic acid  1 mg Oral Daily  . hydrOXYzine  10 mg Oral BID  . levothyroxine  75 mcg Oral QAC breakfast  . lipase/protease/amylase  72,000 Units Oral TID WC  . multivitamin with minerals  1 tablet Oral Daily  . nicotine  21 mg Transdermal Daily  . pantoprazole  40 mg Oral Q0600  . pregabalin  50 mg Oral BID  . thiamine  100 mg Oral Daily   Continuous Infusions: . sodium chloride       LOS: 4 days     Cordelia Poche, MD [facility] 12/28/2017, 6:38 AM Pager: [phone]  If 7PM-7AM, please contact night-coverage [email or web address] Password [facility] 12/28/2017, 6:38 AM

## 2017-12-29 ENCOUNTER — Telehealth: Payer: Self-pay | Admitting: Cardiovascular Disease

## 2017-12-29 ENCOUNTER — Ambulatory Visit: Payer: Self-pay | Admitting: Cardiovascular Disease

## 2017-12-29 LAB — CBC WITH DIFFERENTIAL/PLATELET
BASOS ABS: 0 10*3/uL (ref 0.0–0.1)
BASOS PCT: 1 %
EOS PCT: 2 %
Eosinophils Absolute: 0.1 10*3/uL (ref 0.0–0.7)
HCT: 21.3 % — ABNORMAL LOW (ref 36.0–46.0)
Hemoglobin: 6.9 g/dL — CL (ref 12.0–15.0)
Lymphocytes Relative: 25 %
Lymphs Abs: 0.9 10*3/uL (ref 0.7–4.0)
MCH: 34.8 pg — ABNORMAL HIGH (ref 26.0–34.0)
MCHC: 32.4 g/dL (ref 30.0–36.0)
MCV: 107.6 fL — ABNORMAL HIGH (ref 78.0–100.0)
Monocytes Absolute: 0.3 10*3/uL (ref 0.1–1.0)
Monocytes Relative: 8 %
NEUTROS ABS: 2.2 10*3/uL (ref 1.7–7.7)
Neutrophils Relative %: 64 %
PLATELETS: 90 10*3/uL — AB (ref 150–400)
RBC: 1.98 MIL/uL — ABNORMAL LOW (ref 3.87–5.11)
WBC: 3.3 10*3/uL — ABNORMAL LOW (ref 4.0–10.5)

## 2017-12-29 LAB — HEMOGLOBIN AND HEMATOCRIT, BLOOD
HCT: 21.1 % — ABNORMAL LOW (ref 36.0–46.0)
HCT: 24.8 % — ABNORMAL LOW (ref 36.0–46.0)
HEMOGLOBIN: 8 g/dL — AB (ref 12.0–15.0)
Hemoglobin: 6.7 g/dL — CL (ref 12.0–15.0)

## 2017-12-29 LAB — COMPREHENSIVE METABOLIC PANEL
ALBUMIN: 2.7 g/dL — AB (ref 3.5–5.0)
ALT: 17 U/L (ref 14–54)
AST: 43 U/L — AB (ref 15–41)
Alkaline Phosphatase: 112 U/L (ref 38–126)
Anion gap: 13 (ref 5–15)
BUN: 15 mg/dL (ref 6–20)
CHLORIDE: 108 mmol/L (ref 101–111)
CO2: 18 mmol/L — ABNORMAL LOW (ref 22–32)
Calcium: 8.5 mg/dL — ABNORMAL LOW (ref 8.9–10.3)
Creatinine, Ser: 2.52 mg/dL — ABNORMAL HIGH (ref 0.44–1.00)
GFR calc Af Amer: 25 mL/min — ABNORMAL LOW (ref >60)
GFR calc non Af Amer: 22 mL/min — ABNORMAL LOW (ref >60)
GLUCOSE: 92 mg/dL (ref 65–99)
POTASSIUM: 4.1 mmol/L (ref 3.5–5.1)
SODIUM: 139 mmol/L (ref 135–145)
Total Bilirubin: 4 mg/dL — ABNORMAL HIGH (ref 0.3–1.2)
Total Protein: 7.1 g/dL (ref 6.5–8.1)

## 2017-12-29 LAB — OCCULT BLOOD X 1 CARD TO LAB, STOOL: Fecal Occult Bld: NEGATIVE

## 2017-12-29 LAB — GLUCOSE 6 PHOSPHATE DEHYDROGENASE
G6PDH: 11.4 U/g{Hb} (ref 4.6–13.5)
HEMOGLOBIN: 7.3 g/dL — AB (ref 11.1–15.9)

## 2017-12-29 LAB — MISC LABCORP TEST (SEND OUT): Labcorp test code: 183480

## 2017-12-29 LAB — GLUCOSE, CAPILLARY: Glucose-Capillary: 106 mg/dL — ABNORMAL HIGH (ref 65–99)

## 2017-12-29 LAB — PREPARE RBC (CROSSMATCH)

## 2017-12-29 MED ORDER — MORPHINE SULFATE (PF) 2 MG/ML IV SOLN
2.0000 mg | INTRAVENOUS | Status: DC | PRN
  Administered 2017-12-29 – 2018-01-01 (×18): 2 mg via INTRAVENOUS
  Filled 2017-12-29 (×18): qty 1

## 2017-12-29 MED ORDER — SODIUM CHLORIDE 0.9 % IV SOLN
Freq: Once | INTRAVENOUS | Status: AC
  Administered 2017-12-29: 16:00:00 via INTRAVENOUS

## 2017-12-29 MED ORDER — OXYCODONE HCL 5 MG PO TABS: 5.0000 mg | ORAL_TABLET | Freq: Four times a day (QID) | ORAL | Status: DC | PRN

## 2017-12-29 MED ORDER — OXYCODONE HCL 5 MG PO TABS
5.0000 mg | ORAL_TABLET | Freq: Four times a day (QID) | ORAL | Status: DC | PRN
  Administered 2017-12-29 – 2018-01-08 (×37): 5 mg via ORAL
  Filled 2017-12-29 (×38): qty 1

## 2017-12-29 MED ORDER — SODIUM CHLORIDE 0.9 % IV SOLN: Freq: Once | INTRAVENOUS | Status: DC

## 2017-12-29 NOTE — Progress Notes (Signed)
PROGRESS NOTE    Jamie Allen  IEP:[id number] DOB: [birth year] DOA: 12/23/2017 PCP: Ladell Pier, MD   Brief Narrative: Jamie Allen is a 47 y.o. 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 She presents with fevers/chills/nausea and vomiting concerning for sepsis with shock. Also with significant anemia of unknown cause. S/p 3 units of PRBC to date. Receiving another 1 unit today for repeat drop in hemoglobin. No definite source.   Assessment & Plan:   Principal Problem:   Septic shock ([facility]) Active Problems:   Anxiety and depression   Hypothyroidism   GERD (gastroesophageal reflux disease)   Abdominal pain   C. difficile enteritis   Alcoholic cirrhosis of liver (HCC)   Tobacco abuse   Macrocytic anemia   Thrombocytopenia (HCC)   Alcohol abuse   Septic shock Unknown source. Initially thought GI but workup so far is negative. Initially required Levophed which was weaned off. Evaluated by PCCM on admission. No ascites on CT.  Abdomen. Resolved. Still no source. CRP and sed rate slightly elevated. Procalcitonin elevated and trending up. Urine culture negative, however, she obtained antibiotics prior to culture sample. No ascites fluid for sampling. Blood culture no growth (final) -Continue Vantin (renally adjust as kidney function changes)  History of C. Difficile enteritis C. Diff negative.  Alcoholic cirrhosis MELD score of 21. Patient reports last drink was a week ago. AST/ALT stable. Bilirubin significantly elevated. Ammonia wnl. Bilirubin trended down and is now stable. -GI recommendations  Alcohol abuse No withdrawal symptoms -CIWA  Macrocytic anemia Recent rectal bleeding. S/p 2 units PRBC 3/6 and another 1 unit on 3/7. Stable currently but not a great  response with the 1 unit. Has a recent history of transfusion 1-2 months ago. Haptoglobin low. LDH within normal limits. Folate and B12 normal. MCV improved from admission. Hemoglobin stable. ?TTP. DAT negative. FOBT negative. Hematology leaning more towards iron deficiency anemia in setting of cirrhosis/splenomegaly. S/p IV iron. Anemia panel obtained after IV iron and is not accurate. Epo elevated. Cold agglutinin titer negative. Hemoglobin down again today. -hematology recommendations: G6PD (pending). Per patient, possible bone biopsy; awaiting recs today -Transfuse blood for hemoglobin <7 -Repeat FOBT (patient with history of hemorrhoids, but no active bleeding)  Tobacco abuse -Continue nicotine patch  Thrombocytopenia Secondary to cirrhosis. Improving.  GERD -Continue Protonix  Hypothyroidism -Continue Synthroid  Chronic pain Peripheral neuropathy Patient has chronic RLQ pain. Plan for pain management as an outpatient. -Decrease frequency of Lyrica to BID in setting of AKI. May need further dose adjustments -Oxycodone prn. Morphine for severe pain  Acute kidney injury Vancomycin-induced nephrotoxicity Obtained vancomycin trough this morning for concern of vanc nephrotoxicity. Vanc trough resulted and is 56 (elevated). Vancomycin discontinued. No hydronephrosis on renal ultrasound. Creatinine continues to improve. UOP over last 24 hours: 1.5 L -Daily BMP -No nephrotoxic agents -Lyrica dose adjusted  RLQ/pelvic pain Unremarkable CT scan. Pelvic ultrasound with some fluid, but otherwise unremarkable.  Abdominal distension Abdominal x-ray and ultrasound negative for obstruction/significant ascites.   DVT prophylaxis: SCDs Code Status:   Code Status: Full Code Family Communication: None at bedside Disposition Plan: TBD   Consultants:   [facility]  Hematology  Procedures:   2 units PRBC (12/23/17)  1 unit PRBC (12/24/17)  1 unit PRBC  (12/29/17)  Antimicrobials:  Vancomycin (3/6>>3/8)  Zosyn (3/6>>3/9)  Vantin (3/10>>  Subjective: Pelvic pain is chronically painful. Feels weak today.  Objective: Vitals:   12/28/17 0518 12/28/17 2155 12/29/17 0517 12/29/17 1042  BP: 120/61 (!) 98/45 (!) 109/43 127/70  Pulse: 84 87 81 91  Resp: 17 16 18 17   Temp: 99 F (37.2 C) 99 F (37.2 C) 98.9 F (37.2 C) 98.2 F (36.8 C)  TempSrc: Oral Oral Oral Oral  SpO2: 99% 97% 98% 99%  Weight:      Height:        Intake/Output Summary (Last 24 hours) at 12/29/2017 1145 Last data filed at 12/29/2017 0927 Gross per 24 hour  Intake 594 ml  Output 1500 ml  Net -906 ml   Filed Weights   12/23/17 2019 12/24/17 1634  Weight: 60.3 kg (133 lb) 63.9 kg (140 lb 14 oz)    Examination:  General exam: Calm and comfortable.  Respiratory system: Clear to auscultation. Respiratory effort normal. Cardiovascular system: Regular rate and rhythm. Normal S1 and S2. No heart murmurs present. No extra heart sounds Gastrointestinal system: Abdomen is more distended today, soft and mildly tender in RLQ with no rebound or gaurding. Increased bowel sounds heard. Central nervous system: Alert, oriented, no focal deficits Extremities: No edema or calf tenderness Skin: No cyanosis. No rashes. Mild jaundice of forehead Psychiatry: Judgement/insight normal. Affect and mood normal    Data Reviewed: I have personally reviewed following labs and imaging studies  CBC: Recent Labs  Lab 12/23/17 2036  12/25/17 0214  12/26/17 0458 12/27/17 0605 12/28/17 0605 12/29/17 0521 12/29/17 0746  WBC 4.7   < > 5.8  --  4.1 3.5* 3.7* 3.3*  --   NEUTROABS 3.9  --   --   --   --  2.3 2.4 2.2  --   HGB 5.4*   < > 7.0*   < > 7.1* 7.3* 7.9* 6.9* 6.7*  HCT 15.8*   < > 21.0*   < > 21.0* 21.8* 23.6* 21.3* 21.1*  MCV 124.4*   < > 105.0*  --  105.5* 107.4* 107.3* 107.6*  --   PLT 65*   < > 52*  --  47* 63* 86* 90*  --    < > = values in this interval not  displayed.   Basic Metabolic Panel: Recent Labs  Lab 12/25/17 0214 12/26/17 0458 12/27/17 0605 12/28/17 0605 12/29/17 0521  NA 132* 131* 135 136 139  K 3.5 4.2 3.9 3.8 4.1  CL 103 99* 103 104 108  CO2 19* 20* 20* 18* 18*  GLUCOSE 101* 89 79 76 92  BUN 7 19 16 18 15   CREATININE 1.45* 3.14* 3.48* 3.09* 2.52*  CALCIUM 7.4* 7.6* 7.9* 8.4* 8.5*   GFR: Estimated Creatinine Clearance: 23.6 mL/min (A) (by C-G formula based on SCr of 2.52 mg/dL (H)). Liver Function Tests: Recent Labs  Lab 12/25/17 0214 12/26/17 0458 12/27/17 0605 12/28/17 0605 12/29/17 0521  AST 41 56* 46* 47* 43*  ALT 17 20 20 19 17   ALKPHOS 127* 108 108 122 112  BILITOT 6.4* 5.0* 3.9* 3.9* 4.0*  PROT 6.4* 6.8 6.8 7.5 7.1  ALBUMIN 2.6* 2.5* 2.5* 2.8* 2.7*   Recent Labs  Lab 12/23/17 2036  LIPASE 33   Recent Labs  Lab 12/23/17 2320  AMMONIA 24   Coagulation Profile: Recent Labs  Lab 12/23/17 2036 12/23/17 2253 12/24/17 0638 12/26/17 0458  INR 1.64 1.80 1.84 1.59   Cardiac Enzymes: No results for input(s): CKTOTAL, CKMB, CKMBINDEX, TROPONINI in the last 168 hours. BNP (  last 3 results) No results for input(s): PROBNP in the last 8760 hours. HbA1C: No results for input(s): HGBA1C in the last 72 hours. CBG: Recent Labs  Lab 12/25/17 0759 12/26/17 0745 12/27/17 0824 12/28/17 0803 12/29/17 0740  GLUCAP 76 90 90 93 106*   Lipid Profile: No results for input(s): CHOL, HDL, LDLCALC, TRIG, CHOLHDL, LDLDIRECT in the last 72 hours. Thyroid Function Tests: No results for input(s): TSH, T4TOTAL, FREET4, T3FREE, THYROIDAB in the last 72 hours. Anemia Panel: Recent Labs    12/26/17 1719 12/27/17 0605  VITAMINB12  --  886  FOLATE  --  19.6  FERRITIN  --  726*  TIBC  --  176*  IRON  --  294*  RETICCTPCT 5.4* 6.8*   Sepsis Labs: Recent Labs  Lab 12/23/17 2052 12/23/17 2216 12/24/17 0638 12/25/17 0214 12/26/17 0458  PROCALCITON  --   --  0.24 0.71 1.50  LATICACIDVEN 4.83* 3.79* 1.5   --   --     Recent Results (from the past 240 hour(s))  Blood culture (routine x 2)     Status: None   Collection Time: 12/23/17  7:50 PM  Result Value Ref Range Status   Specimen Description BLOOD RIGHT HAND  Final   Special Requests   Final    IN PEDIATRIC BOTTLE Blood Culture results may not be optimal due to an excessive volume of blood received in culture bottles   Culture   Final    NO GROWTH 5 DAYS Performed at [facility], [place] [address], [place], [place] [address]    Report Status 12/28/2017 FINAL  Final  Blood culture (routine x 2)     Status: None   Collection Time: 12/23/17  7:50 PM  Result Value Ref Range Status   Specimen Description BLOOD LEFT ANTECUBITAL  Final   Special Requests   Final    BOTTLES DRAWN AEROBIC AND ANAEROBIC Blood Culture adequate volume   Culture   Final    NO GROWTH 5 DAYS Performed at [facility], [place] [address] [place], [place] [address]    Report Status 12/28/2017 FINAL  Final  Stool culture (children & immunocomp patients)     Status: None   Collection Time: 12/24/17  2:53 AM  Result Value Ref Range Status   Salmonella/Shigella Screen Final report  Final   Campylobacter Culture Final report  Final   E coli, Shiga toxin Assay Negative Negative Final    Comment: (NOTE) Performed At: [facility] [address] [place], [place] [id number] Rush Farmer MD PJ:[id number] Performed at [facility], [place] [address], [place], [place] [address]   C difficile quick scan w PCR reflex     Status: None   Collection Time: 12/24/17  2:53 AM  Result Value Ref Range Status   C Diff antigen NEGATIVE NEGATIVE Final   C Diff toxin NEGATIVE NEGATIVE Final   C Diff interpretation No C. difficile detected.  Final    Comment: Performed at [facility], [place] [address], [place], [place] [address]  STOOL CULTURE REFLEX - RSASHR     Status: None   Collection Time: 12/24/17  2:53 AM  Result Value Ref Range Status   Stool  Culture result 1 (RSASHR) Comment  Final    Comment: (NOTE) No Salmonella or Shigella recovered. Performed At: [facility] [place], [place] [id number] Rush Farmer MD [id number] Performed at [facility], [place],  [place] [address]   STOOL CULTURE Reflex - CMPCXR     Status: None   Collection Time: 12/24/17  2:53 AM  Result Value Ref Range Status   Stool Culture result 1 (CMPCXR) Comment  Final    Comment: (NOTE) No Campylobacter species isolated. Performed At: [facility] [place], [place] [id number] Rush Farmer MD [id number] Performed at [facility], [place] [address], [place], [place] [address]   Urine culture     Status: None   Collection Time: 12/24/17  3:30 AM  Result Value Ref Range Status   Specimen Description URINE, RANDOM  Final   Special Requests NONE  Final   Culture   Final    NO GROWTH Performed at [facility], [place] [address] [place], [place] [address]    Report Status 12/25/2017 FINAL  Final  Respiratory Panel by PCR     Status: None   Collection Time: 12/24/17  6:20 AM  Result Value Ref Range Status   Adenovirus NOT DETECTED NOT DETECTED Final   Coronavirus 229E NOT DETECTED NOT DETECTED Final   Coronavirus HKU1 NOT DETECTED NOT DETECTED Final   Coronavirus NL63 NOT DETECTED NOT DETECTED Final   Coronavirus OC43 NOT DETECTED NOT DETECTED Final   Metapneumovirus NOT DETECTED NOT DETECTED Final   Rhinovirus / Enterovirus NOT DETECTED NOT DETECTED Final   Influenza A NOT DETECTED NOT DETECTED Final   Influenza B NOT DETECTED NOT DETECTED Final   Parainfluenza Virus 1 NOT DETECTED NOT DETECTED Final   Parainfluenza Virus 2 NOT DETECTED NOT DETECTED Final   Parainfluenza Virus 3 NOT DETECTED NOT DETECTED Final   Parainfluenza Virus 4 NOT DETECTED NOT DETECTED Final   Respiratory Syncytial Virus NOT DETECTED NOT DETECTED Final   Bordetella pertussis NOT DETECTED NOT  DETECTED Final   Chlamydophila pneumoniae NOT DETECTED NOT DETECTED Final   Mycoplasma pneumoniae NOT DETECTED NOT DETECTED Final  MRSA PCR Screening     Status: None   Collection Time: 12/25/17  5:12 AM  Result Value Ref Range Status   MRSA by PCR NEGATIVE NEGATIVE Final    Comment:        The GeneXpert MRSA Assay (FDA approved for NASAL specimens only), is one component of a comprehensive MRSA colonization surveillance program. It is not intended to diagnose MRSA infection nor to guide or monitor treatment for MRSA infections. Performed at [facility], [place] [address] [place],  [address]          Radiology Studies: US Abdomen Limited  Result Date: 12/28/2017 CLINICAL DATA:  Abdominal distension EXAM: LIMITED ABDOMEN ULTRASOUND FOR ASCITES TECHNIQUE: Limited ultrasound survey for ascites was performed in all four abdominal quadrants. COMPARISON:  None. FINDINGS: Minimal free fluid is noted surrounding the falciform ligament. No sizable amount of ascites is noted. IMPRESSION: Minimal free fluid in the abdomen. Electronically Signed   By: Inez Catalina M.D.   On: 12/28/2017 19:44   Dg Abd Portable 1v  Result Date: 12/28/2017 CLINICAL DATA:  Distended abdomen EXAM: PORTABLE ABDOMEN - 1 VIEW COMPARISON:  CT 12/24/2017 FINDINGS: Normal bowel gas pattern. Surgical clips in the gallbladder fossa. Foley catheter. No renal calculi. IMPRESSION: Negative. Electronically Signed   By: Franchot Gallo M.D.   On: 12/28/2017 12:54        Scheduled Meds: . cefpodoxime  200 mg Oral Daily  . colestipol  1 g Oral BID  . folic acid  1 mg Oral Daily  . hydrOXYzine  10 mg Oral BID  . levothyroxine  75 mcg Oral QAC breakfast  . lipase/protease/amylase  72,000 Units Oral TID WC  . multivitamin with minerals  1 tablet Oral Daily  . nicotine  21 mg Transdermal Daily  . pantoprazole  40 mg Oral Q0600  . pregabalin  50 mg Oral BID  . thiamine  100 mg Oral Daily   Continuous  Infusions: . sodium chloride       LOS: 5 days     Cordelia Poche, MD [facility] 12/29/2017, 11:45 AM Pager: [phone]  If 7PM-7AM, please contact night-coverage [email or web address] Password [facility] 12/29/2017, 11:45 AM

## 2017-12-29 NOTE — Progress Notes (Signed)
CRITICAL VALUE ALERT  Critical Value:  6.9  Date & Time Notied:  3/12 @0733   Provider Notified: MD Lonny Prude  Orders Received/Actions taken: H&H ordered

## 2017-12-29 NOTE — Telephone Encounter (Signed)
Patient states that when she got to [place]

## 2017-12-29 NOTE — Evaluation (Signed)
Physical Therapy Evaluation [name] MRN: [id number] DOB: [birth year] Today's Date: 12/29/2017   History of Present Illness  Jamie Allen is a 47 y.o. 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 She presents with fevers/chills/nausea and vomiting concerning for sepsis with shock. Also with significant anemia of unknown cause. S/p 3 units of PRBC as of 3/11, and Hgb 6.9  Clinical Impression   Pt admitted with above diagnosis. Pt currently with functional limitations due to the deficits listed below (see PT Problem List). Overall moving well, able to walk the hallways with supervision, cues to self-monitor for activity tolerance; BP standing during amb 108/63, HR 94; Noted some difficulty with x1 vestibular assessment (opted to look at that after pt reported losing balance in the shower when her eyes are closed); Will ask for a Vestibular Evaluation next session;  Pt will benefit from skilled PT to increase their independence and safety with mobility to allow discharge to the venue listed below.       Follow Up Recommendations No PT follow up    Equipment Recommendations  None recommended by PT    Recommendations for Other Services       Precautions / Restrictions Precautions Precautions: Other (comment) Precaution Comments: Hypotensive, watch BP      Mobility  Bed Mobility                  Transfers Overall transfer level: Needs assistance Equipment used: None Transfers: Sit to/from Stand Sit to Stand: Supervision         General transfer comment: Cues to self-monitor for activity tolerance  Ambulation/Gait Ambulation/Gait assistance: Supervision Ambulation Distance (Feet): 200 Feet Assistive device: None Gait  Pattern/deviations: WFL(Within Functional Limits)     General Gait Details: Cues to self-monitor for activity tolerance; overall reports feeling "wobbly", but no observed loss of balance  Stairs            Wheelchair Mobility    Modified Rankin (Stroke Patients Only)       Balance Overall balance assessment: Mild deficits observed, not formally tested                                           Pertinent Vitals/Pain Pain Assessment: No/denies pain    Home Living Family/patient expects to be discharged to:: Private residence Living Arrangements: Spouse/significant other Available Help at Discharge: Family;Available PRN/intermittently Type of Home: Apartment Home Access: Stairs to enter Entrance Stairs-Rails: Right;Left Entrance Stairs-Number of Steps: 12 Home Layout: One level Home Equipment: Grab bars - tub/shower;Cane - single point;Shower seat      Prior Function Level of Independence: Independent         Comments: Upon further discussion, Carolie reports occasional loss of balance in the shower when she closes her eyes     Hand Dominance   Dominant Hand: Right    Extremity/Trunk Assessment   Upper Extremity Assessment Upper Extremity Assessment: Overall WFL for tasks assessed    Lower Extremity Assessment Lower Extremity Assessment: Generalized weakness       Communication   Communication: No difficulties  Cognition Arousal/Alertness: Awake/alert Behavior During Therapy: WFL for tasks assessed/performed Overall Cognitive Status: Within Functional Limits for tasks assessed  General Comments General comments (skin integrity, edema, etc.): After pt mentioned a tendency to lose her balance in the shower, especially when closing her eyes, we optd to look at her Vestibular function; Performed x1 exercises, but turned head slowly, and reported nausea with the activity; uncoordinated  eye movement, in particular to the L    Exercises     Assessment/Plan    PT Assessment Patient needs continued PT services  PT Problem List Decreased strength;Decreased activity tolerance;Decreased balance       PT Treatment Interventions DME instruction;Gait training;Stair training;Functional mobility training;Therapeutic activities;Therapeutic exercise;Balance training;Neuromuscular re-education;Cognitive remediation;Patient/family education    PT Goals (Current goals can be found in the Care Plan section)  Acute Rehab PT Goals Patient Stated Goal: get better and get home PT Goal Formulation: With patient Time For Goal Achievement: 01/12/18 Potential to Achieve Goals: Good    Frequency Min 3X/week   Barriers to discharge        Co-evaluation               AM-PAC PT "6 Clicks" Daily Activity  Outcome Measure Difficulty turning over in bed (including adjusting bedclothes, sheets and blankets)?: None Difficulty moving from lying on back to sitting on the side of the bed? : None Difficulty sitting down on and standing up from a chair with arms (e.g., wheelchair, bedside commode, etc,.)?: None Help needed moving to and from a bed to chair (including a wheelchair)?: None Help needed walking in hospital room?: None Help needed climbing 3-5 steps with a railing? : A Little 6 Click Score: 23    End of Session   Activity Tolerance: Patient tolerated treatment well Patient left: in bed;with call bell/phone within reach;Other (comment)(sitting EOB with nursing students preparing IV) Nurse Communication: Mobility status PT Visit Diagnosis: Other abnormalities of gait and mobility (R26.89);Dizziness and giddiness (R42)    Time: 9147-8295 PT Time Calculation (min) (ACUTE ONLY): 23 min   Charges:   PT Evaluation $PT Eval Low Complexity: 1 Low PT Treatments $Gait Training: 8-22 mins   PT G Codes:        Roney Marion, PT  Acute Rehabilitation Services Pager  [phone] Office [phone]   Colletta [place] 12/29/2017, 3:51 PM

## 2017-12-30 ENCOUNTER — Inpatient Hospital Stay (HOSPITAL_COMMUNITY): Payer: Medicaid Other

## 2017-12-30 DIAGNOSIS — R740 Nonspecific elevation of levels of transaminase and lactic acid dehydrogenase [LDH]: Secondary | ICD-10-CM

## 2017-12-30 LAB — COMPREHENSIVE METABOLIC PANEL
ALK PHOS: 120 U/L (ref 38–126)
ALT: 18 U/L (ref 14–54)
AST: 50 U/L — AB (ref 15–41)
Albumin: 3 g/dL — ABNORMAL LOW (ref 3.5–5.0)
Anion gap: 10 (ref 5–15)
BUN: 13 mg/dL (ref 6–20)
CALCIUM: 8.8 mg/dL — AB (ref 8.9–10.3)
CO2: 21 mmol/L — AB (ref 22–32)
Chloride: 105 mmol/L (ref 101–111)
Creatinine, Ser: 2.2 mg/dL — ABNORMAL HIGH (ref 0.44–1.00)
GFR calc Af Amer: 29 mL/min — ABNORMAL LOW (ref >60)
GFR calc non Af Amer: 25 mL/min — ABNORMAL LOW (ref >60)
Glucose, Bld: 82 mg/dL (ref 65–99)
Potassium: 4.1 mmol/L (ref 3.5–5.1)
SODIUM: 136 mmol/L (ref 135–145)
Total Bilirubin: 5 mg/dL — ABNORMAL HIGH (ref 0.3–1.2)
Total Protein: 7.5 g/dL (ref 6.5–8.1)

## 2017-12-30 LAB — CBC WITH DIFFERENTIAL/PLATELET
BASOS PCT: 1 %
Basophils Absolute: 0 10*3/uL (ref 0.0–0.1)
EOS ABS: 0.1 10*3/uL (ref 0.0–0.7)
Eosinophils Relative: 2 %
HCT: 25.2 % — ABNORMAL LOW (ref 36.0–46.0)
HEMOGLOBIN: 8 g/dL — AB (ref 12.0–15.0)
LYMPHS PCT: 26 %
Lymphs Abs: 1 10*3/uL (ref 0.7–4.0)
MCH: 31.4 pg (ref 26.0–34.0)
MCHC: 31.7 g/dL (ref 30.0–36.0)
MCV: 98.8 fL (ref 78.0–100.0)
Monocytes Absolute: 0.2 10*3/uL (ref 0.1–1.0)
Monocytes Relative: 6 %
NEUTROS PCT: 65 %
Neutro Abs: 2.6 10*3/uL (ref 1.7–7.7)
Platelets: 102 10*3/uL — ABNORMAL LOW (ref 150–400)
RBC: 2.55 MIL/uL — ABNORMAL LOW (ref 3.87–5.11)
WBC: 3.9 10*3/uL — ABNORMAL LOW (ref 4.0–10.5)

## 2017-12-30 LAB — RETICULOCYTES
RBC.: 2.6 MIL/uL — ABNORMAL LOW (ref 3.87–5.11)
Retic Count, Absolute: 111.8 10*3/uL (ref 19.0–186.0)
Retic Ct Pct: 4.3 % — ABNORMAL HIGH (ref 0.4–3.1)

## 2017-12-30 LAB — GLUCOSE, CAPILLARY: Glucose-Capillary: 85 mg/dL (ref 65–99)

## 2017-12-30 MED ORDER — MIDAZOLAM HCL 2 MG/2ML IJ SOLN
INTRAMUSCULAR | Status: AC
  Filled 2017-12-30: qty 2

## 2017-12-30 MED ORDER — FENTANYL CITRATE (PF) 100 MCG/2ML IJ SOLN
INTRAMUSCULAR | Status: AC
  Filled 2017-12-30: qty 2

## 2017-12-30 MED ORDER — LIDOCAINE HCL 1 % IJ SOLN
INTRAMUSCULAR | Status: AC
  Filled 2017-12-30: qty 20

## 2017-12-30 MED ORDER — MIDAZOLAM HCL 2 MG/2ML IJ SOLN
INTRAMUSCULAR | Status: AC | PRN
  Administered 2017-12-30: 1 mg via INTRAVENOUS

## 2017-12-30 MED ORDER — FENTANYL CITRATE (PF) 100 MCG/2ML IJ SOLN
INTRAMUSCULAR | Status: AC | PRN
  Administered 2017-12-30 (×3): 50 ug via INTRAVENOUS

## 2017-12-30 MED ORDER — DARBEPOETIN ALFA 300 MCG/0.6ML IJ SOSY
300.0000 ug | PREFILLED_SYRINGE | Freq: Once | INTRAMUSCULAR | Status: AC
  Administered 2017-12-30: 300 ug via SUBCUTANEOUS
  Filled 2017-12-30: qty 0.6

## 2017-12-30 MED ORDER — LORAZEPAM 2 MG/ML IJ SOLN
INTRAMUSCULAR | Status: AC | PRN
  Administered 2017-12-30: 1 mg via INTRAVENOUS

## 2017-12-30 NOTE — Progress Notes (Signed)
Unfortunately, Ms. Gaffin's hemoglobin dropped yesterday.  She was 7.9.  Then she went to 6.9.  There has been no obvious bleeding All of her labs so far have been normal.  Her erythropoietin level is on the low side for her anemia.  She does have renal insufficiency which is improving.  I suppose there may be some element of hemolysis.  All of her tests so far have been unremarkable.  Her direct Coombs is negative.  Her LDH has been slightly elevated.  Her G6PD level is normal.  Again, she has had no obvious bleeding.  Her appetite is doing better.  She is had no fever.  Cultures  Are negative.  I talked her today.  I think we probably will have to get a bone marrow test done.  We will set this up for tomorrow.  I will have radiology do this.  If she is hemolyzing, I am not sure as to what the etiology would be.  We will see what her LDH is.  She agrees to the bone marrow test.  There is nothing on her physical exam that would suggest an etiology.  We will follow along.  She is still in good spirits.  Lattie Haw, MD  Psalms 91:1-2 I

## 2017-12-30 NOTE — Sedation Documentation (Signed)
Vital signs stable. 

## 2017-12-30 NOTE — Sedation Documentation (Signed)
Patient is resting comfortably. 

## 2017-12-30 NOTE — Procedures (Signed)
Anemia  S/p RT ILIAC BM ASP AND CORE BX  No comp Stable EBL 0 Path pending Full report in pacs

## 2017-12-30 NOTE — Progress Notes (Signed)
PROGRESS NOTE    Jamie Allen  KNL:[id number] DOB: 1971-10-13 DOA: 12/23/2017 PCP: Ladell Pier, MD   Brief Narrative by Dr Lonny Prude: Jamie Allen is a 47 y.o. femalewith medical history significant ofcirrhosis (unclear if NASH vs Alcoholic), GERD, HTN, Anemia, andrecurrent C diff(s/p offecal transplant),alcohol abuse, tobacco abuse, GERD, hypothyroidism, depression, anxiety, drug-seeking behavior. She presents with fevers/chills/nausea and vomiting concerning for sepsis with shock. Also with significant anemia of unknown cause. S/p 4 units of PRBC to date. No definite source.    Assessment & Plan:   Principal Problem:   Septic shock ([place]) Active Problems:   Anxiety and depression   Hypothyroidism   GERD (gastroesophageal reflux disease)   Abdominal pain   C. difficile enteritis   Alcoholic cirrhosis of liver (HCC)   Tobacco abuse   Macrocytic anemia   Thrombocytopenia (HCC)   Alcohol abuse  1-Septic Shock;  Received 3 days of Zosyn and 4 days of cefuroxime. Will observed off antibiotics.  [name] no significant ascites, pelvic US small amount of physiologic fluid.   Anemia;  B 12, folic acid normal. Cold agglutinin titter negative.  No evidence of active bleeding. Received 4 units of PRBC Appreciate Dr Marin Olp help. Plan for BM biopsy.  Hb at 8 today,.   Alcoholic Cirrhosis;  Last drink a week ago.  AST stable. Bili trending down.    History of C dif Enteritis;  C diff negative.   Thrombocytopenia Secondary to cirrhosis. Improving.  Vancomycin-induced nephrotoxicity  Vanc trough resulted was 56 (elevated). Vancomycin discontinued. No hydronephrosis on renal ultrasound. Creatinine continues to improve.  Renal function slowly improving.   Peripheral neuropathy. On lyrica, dose reduce due to AKI.   GERD -Continue Protonix  Hypothyroidism -Continue Synthroid   DVT prophylaxis: scd Code Status: full code.  Family Communication: care  discussed with patient.  Disposition Plan: home when hb stable.   Consultants:   Hematologist    Procedures:   BM biopsy   Antimicrobials:   Vancomycin (3/6>>3/8)  Zosyn (3/6>>3/9)  Vantin (3/10>>     Subjective: She relates chronic abdominal pain, stable. Denies dyspnea.   Objective: Vitals:   12/29/17 1838 12/29/17 2347 12/30/17 0009 12/30/17 0528  BP: (!) 130/49 (!) 117/37 (!) 124/50 (!) 109/46  Pulse: 93 93 87 92  Resp: 18 17  17   Temp: 99.7 F (37.6 C) 98.7 F (37.1 C)  99.3 F (37.4 C)  TempSrc: Oral Oral  Oral  SpO2: 100% 98%  100%  Weight:      Height:        Intake/Output Summary (Last 24 hours) at 12/30/2017 0901 Last data filed at 12/30/2017 0529 Gross per 24 hour  Intake 1373.5 ml  Output -  Net 1373.5 ml   Filed Weights   12/23/17 2019 12/24/17 1634  Weight: 60.3 kg (133 lb) 63.9 kg (140 lb 14 oz)    Examination:  General exam: Appears calm and comfortable  Respiratory system: Clear to auscultation. Respiratory effort normal. Cardiovascular system: S1 & S2 heard, RRR. No JVD, murmurs, rubs, gallops or clicks. No pedal edema. Gastrointestinal system: Abdomen is nondistended, soft and nontender. No organomegaly or masses felt. Normal bowel sounds heard. Central nervous system: Alert and oriented. No focal neurological deficits. Extremities: Symmetric 5 x 5 power. Skin: No rashes, lesions or ulcers Psychiatry: Judgement and insight appear normal. Mood & affect appropriate.     Data Reviewed: I have personally reviewed following labs and imaging studies  CBC: Recent Labs  Lab 12/23/17  2036  12/26/17 0458 12/27/17 0605 12/28/17 0605 12/29/17 0521 12/29/17 0746 12/29/17 2126 12/30/17 0515  WBC 4.7   < > 4.1 3.5* 3.7* 3.3*  --   --  3.9*  NEUTROABS 3.9  --   --  2.3 2.4 2.2  --   --  2.6  HGB 5.4*   < > 7.1* 7.3*  7.3* 7.9* 6.9* 6.7* 8.0* 8.0*  HCT 15.8*   < > 21.0* 21.8* 23.6* 21.3* 21.1* 24.8* 25.2*  MCV 124.4*   < > 105.5*  107.4* 107.3* 107.6*  --   --  98.8  PLT 65*   < > 47* 63* 86* 90*  --   --  102*   < > = values in this interval not displayed.   Basic Metabolic Panel: Recent Labs  Lab 12/26/17 0458 12/27/17 0605 12/28/17 0605 12/29/17 0521 12/30/17 0515  NA 131* 135 136 139 136  K 4.2 3.9 3.8 4.1 4.1  CL 99* 103 104 108 105  CO2 20* 20* 18* 18* 21*  GLUCOSE 89 79 76 92 82  BUN 19 16 18 15 13   CREATININE 3.14* 3.48* 3.09* 2.52* 2.20*  CALCIUM 7.6* 7.9* 8.4* 8.5* 8.8*   GFR: Estimated Creatinine Clearance: 27 mL/min (A) (by C-G formula based on SCr of 2.2 mg/dL (H)). Liver Function Tests: Recent Labs  Lab 12/26/17 0458 12/27/17 0605 12/28/17 0605 12/29/17 0521 12/30/17 0515  AST 56* 46* 47* 43* 50*  ALT 20 20 19 17 18   ALKPHOS 108 108 122 112 120  BILITOT 5.0* 3.9* 3.9* 4.0* 5.0*  PROT 6.8 6.8 7.5 7.1 7.5  ALBUMIN 2.5* 2.5* 2.8* 2.7* 3.0*   Recent Labs  Lab 12/23/17 2036  LIPASE 33   Recent Labs  Lab 12/23/17 2320  AMMONIA 24   Coagulation Profile: Recent Labs  Lab 12/23/17 2036 12/23/17 2253 12/24/17 0638 12/26/17 0458  INR 1.64 1.80 1.84 1.59   Cardiac Enzymes: No results for input(s): CKTOTAL, CKMB, CKMBINDEX, TROPONINI in the last 168 hours. BNP (last 3 results) No results for input(s): PROBNP in the last 8760 hours. HbA1C: No results for input(s): HGBA1C in the last 72 hours. CBG: Recent Labs  Lab 12/25/17 0759 12/26/17 0745 12/27/17 0824 12/28/17 0803 12/29/17 0740  GLUCAP 76 90 90 93 106*   Lipid Profile: No results for input(s): CHOL, HDL, LDLCALC, TRIG, CHOLHDL, LDLDIRECT in the last 72 hours. Thyroid Function Tests: No results for input(s): TSH, T4TOTAL, FREET4, T3FREE, THYROIDAB in the last 72 hours. Anemia Panel: Recent Labs    12/30/17 0515  RETICCTPCT 4.3*   Sepsis Labs: Recent Labs  Lab 12/23/17 2052 12/23/17 2216 12/24/17 0638 12/25/17 0214 12/26/17 0458  PROCALCITON  --   --  0.24 0.71 1.50  LATICACIDVEN 4.83* 3.79* 1.5  --    --     Recent Results (from the past 240 hour(s))  Blood culture (routine x 2)     Status: None   Collection Time: 12/23/17  7:50 PM  Result Value Ref Range Status   Specimen Description BLOOD RIGHT HAND  Final   Special Requests   Final    IN PEDIATRIC BOTTLE Blood Culture results may not be optimal due to an excessive volume of blood received in culture bottles   Culture   Final    NO GROWTH 5 DAYS Performed at [facility], [place] [address], [place], [place] [address]    Report Status 12/28/2017 FINAL  Final  Blood culture (routine x 2)  Status: None   Collection Time: 12/23/17  7:50 PM  Result Value Ref Range Status   Specimen Description BLOOD LEFT ANTECUBITAL  Final   Special Requests   Final    BOTTLES DRAWN AEROBIC AND ANAEROBIC Blood Culture adequate volume   Culture   Final    NO GROWTH 5 DAYS Performed at [facility], [address] [place], [place] [address]    Report Status 12/28/2017 FINAL  Final  Stool culture (children & immunocomp patients)     Status: None   Collection Time: 12/24/17  2:53 AM  Result Value Ref Range Status   Salmonella/Shigella Screen Final report  Final   Campylobacter Culture Final report  Final   E coli, Shiga toxin Assay Negative Negative Final    Comment: (NOTE) Performed At: [facility] [address] [place], [place] [id number] Rush Farmer MD AS:[id number] Performed at [facility], [place] [address], [place], [place] [address]   C difficile quick scan w PCR reflex     Status: None   Collection Time: 12/24/17  2:53 AM  Result Value Ref Range Status   C Diff antigen NEGATIVE NEGATIVE Final   C Diff toxin NEGATIVE NEGATIVE Final   C Diff interpretation No C. difficile detected.  Final    Comment: Performed at [facility], [place] [address], [place], [place] [address]  STOOL CULTURE REFLEX - RSASHR     Status: None   Collection Time: 12/24/17  2:53 AM  Result Value Ref Range Status   Stool  Culture result 1 (RSASHR) Comment  Final    Comment: (NOTE) No Salmonella or Shigella recovered. Performed At: [facility] [place], [place] [id number] Rush Farmer MD QA:[id number] Performed at [facility], [place] [address], [place], [place] [address]   STOOL CULTURE Reflex - CMPCXR     Status: None   Collection Time: 12/24/17  2:53 AM  Result Value Ref Range Status   Stool Culture result 1 (CMPCXR) Comment  Final    Comment: (NOTE) No Campylobacter species isolated. Performed At: [facility] [place], [place] [id number] Rush Farmer MD [id number] Performed at [facility], [place] [address] [place], [place] [address]   Urine culture     Status: None   Collection Time: 12/24/17  3:30 AM  Result Value Ref Range Status   Specimen Description URINE, RANDOM  Final   Special Requests NONE  Final   Culture   Final    NO GROWTH Performed at [facility], [place] [address] [place],  [address]    Report Status 12/25/2017 FINAL  Final  Respiratory Panel by PCR     Status: None   Collection Time: 12/24/17  6:20 AM  Result Value Ref Range Status   Adenovirus NOT DETECTED NOT DETECTED Final   Coronavirus 229E NOT DETECTED NOT DETECTED Final   Coronavirus HKU1 NOT DETECTED NOT DETECTED Final   Coronavirus NL63 NOT DETECTED NOT DETECTED Final   Coronavirus OC43 NOT DETECTED NOT DETECTED Final   Metapneumovirus NOT DETECTED NOT DETECTED Final   Rhinovirus / Enterovirus NOT DETECTED NOT DETECTED Final   Influenza A NOT DETECTED NOT DETECTED Final   Influenza B NOT DETECTED NOT DETECTED Final   Parainfluenza Virus 1 NOT DETECTED NOT DETECTED Final   Parainfluenza Virus 2 NOT DETECTED NOT DETECTED Final   Parainfluenza Virus 3 NOT DETECTED NOT DETECTED Final   Parainfluenza Virus 4 NOT  DETECTED NOT DETECTED Final   Respiratory Syncytial Virus NOT DETECTED NOT DETECTED Final   Bordetella pertussis NOT DETECTED NOT  DETECTED Final   Chlamydophila pneumoniae NOT DETECTED NOT DETECTED Final   Mycoplasma pneumoniae NOT DETECTED NOT DETECTED Final  MRSA PCR Screening     Status: None   Collection Time: 12/25/17  5:12 AM  Result Value Ref Range Status   MRSA by PCR NEGATIVE NEGATIVE Final    Comment:        The GeneXpert MRSA Assay (FDA approved for NASAL specimens only), is one component of a comprehensive MRSA colonization surveillance program. It is not intended to diagnose MRSA infection nor to guide or monitor treatment for MRSA infections. Performed at [facility], [place] [address], [place], [place] [address]          Radiology Studies: US Abdomen Limited  Result Date: 12/28/2017 CLINICAL DATA:  Abdominal distension EXAM: LIMITED ABDOMEN ULTRASOUND FOR ASCITES TECHNIQUE: Limited ultrasound survey for ascites was performed in all four abdominal quadrants. COMPARISON:  None. FINDINGS: Minimal free fluid is noted surrounding the falciform ligament. No sizable amount of ascites is noted. IMPRESSION: Minimal free fluid in the abdomen. Electronically Signed   By: Inez Catalina M.D.   On: 12/28/2017 19:44   Dg Abd Portable 1v  Result Date: 12/28/2017 CLINICAL DATA:  Distended abdomen EXAM: PORTABLE ABDOMEN - 1 VIEW COMPARISON:  CT 12/24/2017 FINDINGS: Normal bowel gas pattern. Surgical clips in the gallbladder fossa. Foley catheter. No renal calculi. IMPRESSION: Negative. Electronically Signed   By: Franchot Gallo M.D.   On: 12/28/2017 12:54        Scheduled Meds: . cefpodoxime  200 mg Oral Daily  . colestipol  1 g Oral BID  . folic acid  1 mg Oral Daily  . hydrOXYzine  10 mg Oral BID  . levothyroxine  75 mcg Oral QAC breakfast  . lipase/protease/amylase  72,000 Units Oral TID WC  . multivitamin with minerals  1 tablet Oral Daily  . nicotine  21 mg Transdermal Daily  . pantoprazole  40 mg Oral Q0600  . pregabalin  50 mg Oral BID  . thiamine  100 mg Oral Daily   Continuous  Infusions: . sodium chloride       LOS: 6 days    Time spent: 35 minutes.     Elmarie Shiley, MD [facility] Pager [phone]  If 7PM-7AM, please contact night-coverage [email or web address] Password [email or web address] 12/30/2017, 9:01 AM
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
## 2017-12-31 DIAGNOSIS — N189 Chronic kidney disease, unspecified: Secondary | ICD-10-CM

## 2017-12-31 LAB — CBC WITH DIFFERENTIAL/PLATELET
BASOS ABS: 0 10*3/uL (ref 0.0–0.1)
Basophils Relative: 1 %
EOS ABS: 0.1 10*3/uL (ref 0.0–0.7)
Eosinophils Relative: 2 %
HCT: 22.8 % — ABNORMAL LOW (ref 36.0–46.0)
HEMOGLOBIN: 7.3 g/dL — AB (ref 12.0–15.0)
LYMPHS PCT: 31 %
Lymphs Abs: 1.1 10*3/uL (ref 0.7–4.0)
MCH: 31.7 pg (ref 26.0–34.0)
MCHC: 32 g/dL (ref 30.0–36.0)
MCV: 99.1 fL (ref 78.0–100.0)
Monocytes Absolute: 0.3 10*3/uL (ref 0.1–1.0)
Monocytes Relative: 8 %
Neutro Abs: 1.9 10*3/uL (ref 1.7–7.7)
Neutrophils Relative %: 58 %
Platelets: 101 10*3/uL — ABNORMAL LOW (ref 150–400)
RBC: 2.3 MIL/uL — AB (ref 3.87–5.11)
WBC: 3.4 10*3/uL — AB (ref 4.0–10.5)

## 2017-12-31 LAB — COMPREHENSIVE METABOLIC PANEL
ALBUMIN: 2.8 g/dL — AB (ref 3.5–5.0)
ALK PHOS: 104 U/L (ref 38–126)
ALT: 17 U/L (ref 14–54)
AST: 46 U/L — ABNORMAL HIGH (ref 15–41)
Anion gap: 9 (ref 5–15)
BUN: 13 mg/dL (ref 6–20)
CALCIUM: 8.5 mg/dL — AB (ref 8.9–10.3)
CHLORIDE: 104 mmol/L (ref 101–111)
CO2: 22 mmol/L (ref 22–32)
CREATININE: 2.21 mg/dL — AB (ref 0.44–1.00)
GFR calc Af Amer: 29 mL/min — ABNORMAL LOW (ref >60)
GFR calc non Af Amer: 25 mL/min — ABNORMAL LOW (ref >60)
GLUCOSE: 87 mg/dL (ref 65–99)
Potassium: 4.2 mmol/L (ref 3.5–5.1)
SODIUM: 135 mmol/L (ref 135–145)
Total Bilirubin: 4.4 mg/dL — ABNORMAL HIGH (ref 0.3–1.2)
Total Protein: 6.8 g/dL (ref 6.5–8.1)

## 2017-12-31 LAB — GLUCOSE, CAPILLARY: Glucose-Capillary: 90 mg/dL (ref 65–99)

## 2017-12-31 LAB — LACTATE DEHYDROGENASE: LDH: 197 U/L — ABNORMAL HIGH (ref 98–192)

## 2017-12-31 MED ORDER — ONDANSETRON HCL 4 MG/2ML IJ SOLN
4.0000 mg | Freq: Four times a day (QID) | INTRAMUSCULAR | Status: DC | PRN
Start: 2017-12-31 — End: 2018-01-08
  Administered 2017-12-31 – 2018-01-05 (×3): 4 mg via INTRAVENOUS
  Filled 2017-12-31 (×3): qty 2

## 2017-12-31 NOTE — Plan of Care (Signed)
  Progressing Education: Knowledge of General Education information will improve 12/31/2017 0048 - Progressing by Aris Lot, RN

## 2017-12-31 NOTE — Progress Notes (Signed)
PROGRESS NOTE    Jamie Allen  WJX:[id number] DOB: [birth year] DOA: 12/23/2017 PCP: Ladell Pier, MD   Brief Narrative by Dr Lonny Prude: "'Jamie Allen is a 47 y.o. femalewith medical history significant ofcirrhosis (unclear if NASH vs Alcoholic), GERD, HTN, Anemia, andrecurrent C diff(s/p offecal transplant),alcohol abuse, tobacco abuse, GERD, hypothyroidism, depression, anxiety, drug-seeking behavior. She presents with fevers/chills/nausea and vomiting concerning for sepsis with shock. Also with significant anemia of unknown cause. S/p 4 units of PRBC to date. No definite source".    Assessment & Plan:   Principal Problem:   Septic shock ([place]) Active Problems:   Anxiety and depression   Hypothyroidism   GERD (gastroesophageal reflux disease)   Abdominal pain   C. difficile enteritis   Alcoholic cirrhosis of liver (HCC)   Tobacco abuse   Macrocytic anemia   Thrombocytopenia (HCC)   Alcohol abuse  1-Septic Shock;  Received 3 days of Zosyn and 4 days of cefuroxime. Will observed off antibiotics.  [name] no significant ascites, pelvic US small amount of physiologic fluid.   Anemia;  B 12, folic acid normal. Cold agglutinin titter negative.  No evidence of active bleeding. Received 4 units of PRBC Appreciate Dr Marin Olp help. Plan for BM biopsy.  Hb decreased to 7.3. Discussed with Dr Marin Olp, hold on transfusion. Patient received aranesp yesterday. Repeat labs in am.   Alcoholic Cirrhosis;  Last drink a week ago.  LFT stable. , bili decreased.    History of C dif Enteritis;  C diff negative.   Thrombocytopenia Secondary to cirrhosis. Stable.   Vancomycin-induced nephrotoxicity  Vanc trough resulted was 56 (elevated). Vancomycin discontinued. No hydronephrosis on renal ultrasound. Creatinine continues to improve.  Renal function slowly improving. Cr at 2.2.   Peripheral neuropathy. On lyrica, dose reduce due to AKI.   GERD -Continue  Protonix  Hypothyroidism -Continue Synthroid   DVT prophylaxis: scd Code Status: full code.  Family Communication: care discussed with patient.  Disposition Plan: home when hb stable.   Consultants:   Hematologist    Procedures:   BM biopsy   Antimicrobials:   Vancomycin (3/6>>3/8)  Zosyn (3/6>>3/9)  Vantin (3/10>>     Subjective: She is doing ok, denies dyspnea.    Objective: Vitals:   12/30/17 2118 12/31/17 0500 12/31/17 0552 12/31/17 1202  BP: (!) 110/54 (!) 114/54 114/60 (!) 113/59  Pulse: 85 75 89 88  Resp: _0 Temp: 98.7 F (37.1 C) 98.1 F (36.7 C)  98.5 F (36.9 C)  TempSrc: Oral Oral  Oral  SpO2: 97% 97%  97%  Weight:      Height:        Intake/Output Summary (Last 24 hours) at 12/31/2017 1636 Last data filed at 12/31/2017 1300 Gross per 24 hour  Intake 440 ml  Output 900 ml  Net -460 ml   Filed Weights   12/23/17 2019 12/24/17 1634  Weight: 60.3 kg (133 lb) 63.9 kg (140 lb 14 oz)    Examination:  General exam: NAD Respiratory system; CTA Cardiovascular system: S 1, S 2 RRR Gastrointestinal system: BS present, soft, nt Central nervous system: non focal.  Extremities: symmetric power.  Skin: no rash  Psychiatry: Mood and affect appropriate   Data Reviewed: I have personally reviewed following labs and imaging studies  CBC: Recent Labs  Lab 12/27/17 0605 12/28/17 0605 12/29/17 0521 12/29/17 0746 12/29/17 2126 12/30/17 0515 12/31/17 0324  WBC 3.5* 3.7* 3.3*  --   --  3.9* 3.4*  NEUTROABS 2.3 2.4 2.2  --   --  2.6 1.9  HGB 7.3*  7.3* 7.9* 6.9* 6.7* 8.0* 8.0* 7.3*  HCT 21.8* 23.6* 21.3* 21.1* 24.8* 25.2* 22.8*  MCV 107.4* 107.3* 107.6*  --   --  98.8 99.1  PLT 63* 86* 90*  --   --  102* 341*   Basic Metabolic Panel: Recent Labs  Lab 12/27/17 0605 12/28/17 0605 12/29/17 0521 12/30/17 0515 12/31/17 0324  NA 135 136 139 136 135  K 3.9 3.8 4.1 4.1 4.2  CL 103 104 108 105 104  CO2 20* 18* 18* 21* 22   GLUCOSE 79 76 92 82 87  BUN _0 CREATININE 3.48* 3.09* 2.52* 2.20* 2.21*  CALCIUM 7.9* 8.4* 8.5* 8.8* 8.5*   GFR: Estimated Creatinine Clearance: 26.9 mL/min (A) (by C-G formula based on SCr of 2.21 mg/dL (H)). Liver Function Tests: Recent Labs  Lab 12/27/17 0605 12/28/17 0605 12/29/17 0521 12/30/17 0515 12/31/17 0324  AST 46* 47* 43* 50* 46*  ALT _1 ALKPHOS 108 122 112 120 104  BILITOT 3.9* 3.9* 4.0* 5.0* 4.4*  PROT 6.8 7.5 7.1 7.5 6.8  ALBUMIN 2.5* 2.8* 2.7* 3.0* 2.8*   No results for input(s): LIPASE, AMYLASE in the last 168 hours. No results for input(s): AMMONIA in the last 168 hours. Coagulation Profile: Recent Labs  Lab 12/26/17 0458  INR 1.59   Cardiac Enzymes: No results for input(s): CKTOTAL, CKMB, CKMBINDEX, TROPONINI in the last 168 hours. BNP (last 3 results) No results for input(s): PROBNP in the last 8760 hours. HbA1C: No results for input(s): HGBA1C in the last 72 hours. CBG: Recent Labs  Lab 12/27/17 0824 12/28/17 0803 12/29/17 0740 12/30/17 0759 12/31/17 0752  GLUCAP 90 93 106* 85 90   Lipid Profile: No results for input(s): CHOL, HDL, LDLCALC, TRIG, CHOLHDL, LDLDIRECT in the last 72 hours. Thyroid Function Tests: No results for input(s): TSH, T4TOTAL, FREET4, T3FREE, THYROIDAB in the last 72 hours. Anemia Panel: Recent Labs    12/30/17 0515  RETICCTPCT 4.3*   Sepsis Labs: Recent Labs  Lab 12/25/17 0214 12/26/17 0458  PROCALCITON 0.71 1.50    Recent Results (from the past 240 hour(s))  Blood culture (routine x 2)     Status: None   Collection Time: 12/23/17  7:50 PM  Result Value Ref Range Status   Specimen Description BLOOD RIGHT HAND  Final   Special Requests   Final    IN PEDIATRIC BOTTLE Blood Culture results may not be optimal due to an excessive volume of blood received in culture bottles   Culture   Final    NO GROWTH 5 DAYS Performed at [facility], [place] [address], [place], [place]  [address]    Report Status 12/28/2017 FINAL  Final  Blood culture (routine x 2)     Status: None   Collection Time: 12/23/17  7:50 PM  Result Value Ref Range Status   Specimen Description BLOOD LEFT ANTECUBITAL  Final   Special Requests   Final    BOTTLES DRAWN AEROBIC AND ANAEROBIC Blood Culture adequate volume   Culture   Final    NO GROWTH 5 DAYS Performed at [facility], [place] [address] [place], [place] [address]    Report Status 12/28/2017 FINAL  Final  Stool culture (children & immunocomp patients)     Status: None   Collection Time: 12/24/17  2:53 AM  Result Value Ref  Range Status   Salmonella/Shigella Screen Final report  Final   Campylobacter Culture Final report  Final   E coli, Shiga toxin Assay Negative Negative Final    Comment: (NOTE) Performed At: [facility] [place], [place] [id number] Rush Farmer MD YB:[id number] Performed at [facility], [place] [address], [place], [place] [address]   C difficile quick scan w PCR reflex     Status: None   Collection Time: 12/24/17  2:53 AM  Result Value Ref Range Status   C Diff antigen NEGATIVE NEGATIVE Final   C Diff toxin NEGATIVE NEGATIVE Final   C Diff interpretation No C. difficile detected.  Final    Comment: Performed at [facility], [place] [address] [place], [place] [address]  STOOL CULTURE REFLEX - RSASHR     Status: None   Collection Time: 12/24/17  2:53 AM  Result Value Ref Range Status   Stool Culture result 1 (RSASHR) Comment  Final    Comment: (NOTE) No Salmonella or Shigella recovered. Performed At: [facility] [place], [place] [id number] Rush Farmer MD LA:[id number] Performed at [facility], [place] [address], [place], [place] [address]   STOOL CULTURE Reflex - CMPCXR     Status: None   Collection Time: 12/24/17  2:53 AM  Result Value Ref Range Status   Stool Culture result 1 (CMPCXR) Comment  Final    Comment: (NOTE) No  Campylobacter species isolated. Performed At: [facility] [place], [place] [id number] Rush Farmer MD GQ:[id number] Performed at [facility], [place] [address], [place], [place] [address]   Urine culture     Status: None   Collection Time: 12/24/17  3:30 AM  Result Value Ref Range Status   Specimen Description URINE, RANDOM  Final   Special Requests NONE  Final   Culture   Final    NO GROWTH Performed at [facility], [place] [address], [place], [place] [address]    Report Status 12/25/2017 FINAL  Final  Respiratory Panel by PCR     Status: None   Collection Time: 12/24/17  6:20 AM  Result Value Ref Range Status   Adenovirus NOT DETECTED NOT DETECTED Final   Coronavirus 229E NOT DETECTED NOT DETECTED Final   Coronavirus HKU1 NOT DETECTED NOT DETECTED Final   Coronavirus NL63 NOT DETECTED NOT DETECTED Final   Coronavirus OC43 NOT DETECTED NOT DETECTED Final   Metapneumovirus NOT DETECTED NOT DETECTED Final   Rhinovirus / Enterovirus NOT DETECTED NOT DETECTED Final   Influenza A NOT DETECTED NOT DETECTED Final   Influenza B NOT DETECTED NOT DETECTED Final   Parainfluenza Virus 1 NOT DETECTED NOT DETECTED Final   Parainfluenza Virus 2 NOT DETECTED NOT DETECTED Final   Parainfluenza Virus 3 NOT DETECTED NOT DETECTED Final   Parainfluenza Virus 4 NOT DETECTED NOT DETECTED Final   Respiratory Syncytial Virus NOT DETECTED NOT DETECTED Final   Bordetella pertussis NOT DETECTED NOT DETECTED Final   Chlamydophila pneumoniae NOT DETECTED NOT DETECTED Final   Mycoplasma pneumoniae NOT DETECTED NOT DETECTED Final  MRSA PCR Screening     Status: None   Collection Time: 12/25/17  5:12 AM  Result Value Ref Range Status   MRSA by PCR NEGATIVE NEGATIVE Final    Comment:        The GeneXpert MRSA Assay (FDA approved for NASAL specimens only), is one component of a comprehensive MRSA colonization surveillance program.  It is not intended to diagnose  MRSA infection nor to guide or monitor treatment for MRSA infections. Performed at [facility], [place] [address] [place], [place] [address]          Radiology Studies: Ct Bone Marrow Biopsy & Aspiration  Result Date: 12/30/2017 INDICATION: Cirrhosis, chronic anemia EXAM: CT GUIDED RIGHT ILIAC BONE MARROW ASPIRATION AND CORE BIOPSY Date:  12/30/2017 12/30/2017 11:40 am Radiologist:  M. Daryll Brod, MD Guidance:  CT FLUOROSCOPY TIME:  Fluoroscopy Time: NONE. MEDICATIONS: None. ANESTHESIA/SEDATION: 2.0 mg IV Versed; 150 mcg IV Fentanyl Moderate Sedation Time:  9 minutes The patient was continuously monitored during the procedure by the interventional radiology nurse under my direct supervision. CONTRAST:  None. COMPLICATIONS: None PROCEDURE: Informed consent was obtained from the patient following explanation of the procedure, risks, benefits and alternatives. The patient understands, agrees and consents for the procedure. All questions were addressed. A time out was performed. The patient was positioned prone and non-contrast localization CT was performed of the pelvis to demonstrate the iliac marrow spaces. Maximal barrier sterile technique utilized including caps, mask, sterile gowns, sterile gloves, large sterile drape, hand hygiene, and Betadine prep. Under sterile conditions and local anesthesia, an 11 gauge coaxial bone biopsy needle was advanced into the right iliac marrow space. Needle position was confirmed with CT imaging. Initially, bone marrow aspiration was performed. Next, the 11 gauge outer cannula was utilized to obtain a right iliac bone marrow core biopsy. Needle was removed. Hemostasis was obtained with compression. The patient tolerated the procedure well. Samples were prepared with the cytotechnologist. No immediate complications. IMPRESSION: CT guided right iliac bone marrow aspiration and core biopsy. Electronically Signed   By: Jerilynn Mages.  Shick M.D.   On: 12/30/2017 11:43         Scheduled Meds: . colestipol  1 g Oral BID  . folic acid  1 mg Oral Daily  . hydrOXYzine  10 mg Oral BID  . levothyroxine  75 mcg Oral QAC breakfast  . lipase/protease/amylase  72,000 Units Oral TID WC  . multivitamin with minerals  1 tablet Oral Daily  . nicotine  21 mg Transdermal Daily  . pantoprazole  40 mg Oral Q0600  . pregabalin  50 mg Oral BID  . thiamine  100 mg Oral Daily   Continuous Infusions: . sodium chloride       LOS: 7 days    Time spent: 35 minutes.     Elmarie Shiley, MD [facility] Pager [phone]  If 7PM-7AM, please contact night-coverage [email or web address] Password [facility] 12/31/2017, 4:36 PM

## 2017-12-31 NOTE — Progress Notes (Signed)
Ms. Yogi is doing okay.  She has she had her bone marrow test yesterday.  I am thankful for radiology been able to do this yesterday.  I would not think that the results will be out until early next week.  I am still somewhat puzzled as to why she has the anemia.  I know she has a relatively low erythropoietin level.  She did get a dose of Aranesp yesterday.  Her hemoglobin dropped today.  She went from hemoglobin of 8 down to 7.3.  Her LDH is 197.  Her creatinine is 2.2.  Her total protein is 6.8 within albumin of 2.8.  Her bilirubin is 4.4.  She is ambulating.  She is eating.  She is having no nausea or vomiting.  Hopefully, the bone marrow will give [name] an idea as to what might be going on.  Overall, her physical exam is pretty much unchanged.  I know she has the cirrhosis.  No she has some splenomegaly.  I would think that the splenomegaly is secondary to portal hypertension.  As always, she is so nice.  I just want to figure out how we can get her to feel better and get her blood count better.  Lattie Haw, MD  Hebrews 6:19

## 2017-12-31 NOTE — Progress Notes (Addendum)
Physical Therapy Treatment/Vestibular Assessment [name] MRN: [id number] DOB: [birth year] Today's Date: 12/31/2017    History of Present Illness Jamie Allen is a 47 y.o. 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 She presents with fevers/chills/nausea and vomiting concerning for sepsis with shock. Also with significant anemia of unknown cause. S/p 3 units of PRBC as of 3/11, and Hgb 6.9    PT Comments    Vestibular assessment was benign with no signs of vestibular dysfunction.  I believe pt needs to have an eye exam (as she has not in years) d/t reporting bil eye blurriness and difficulty reading small print.  She also has h/o lower leg neuropathy which is contributing to her falls and imbalance.  She was not symptomatic (no reports of lightheadedness or dizziness) with gait or stairs today.  PT will continue to follow acutely for gait and balance training.  Please do DGI next session.   Follow Up Recommendations  No PT follow up     Equipment Recommendations  None recommended by PT    Recommendations for Other Services   NA     Precautions / Restrictions Precautions Precautions: Other (comment) Precaution Comments: Hypotensive, watch BP    Mobility  Bed Mobility Overal bed mobility: Independent                Transfers Overall transfer level: Needs assistance Equipment used: None Transfers: Sit to/from Stand Sit to Stand: Supervision         General transfer comment: supervision for safety, education to stand for a few seconds to assess herself before proceeding with gait.    Ambulation/Gait Ambulation/Gait assistance: Supervision Ambulation Distance (Feet): 300 Feet Assistive device: None Gait Pattern/deviations: Staggering  left;Staggering right     General Gait Details: Pt with mildly staggering gait pattern when turning head or rounding corners.   Stairs Stairs: Yes   Stair Management: One rail Right;Step to pattern;Forwards;Sideways Number of Stairs: 10 General stair comments: Pt goes up the stairs forwards and down the stairs sideways because she feels as if she is going to fall forward.          Balance Overall balance assessment: Mild deficits observed, not formally tested           12/31/17 0001  Vestibular Assessment  General Observation Pt reports difficulty reading due to blurry vision, lower leg peripherial neuropathy, low BP, h/o (not recent) head trauma, no corrective eye surgery, has felt dizzy at times for ~4 years.    Symptom Behavior  Type of Dizziness Imbalance  Frequency of Dizziness reports when she stands up quickly or tips her head back in the shower.   Duration of Dizziness short  Aggravating Factors Sitting with head tilted back;Sit to stand  Relieving Factors Slow movements  Occulomotor Exam  Occulomotor Alignment Normal  Spontaneous Absent  Gaze-induced Absent  Smooth Pursuits Intact  Saccades Intact  Vestibulo-Occular Reflex  VOR 1 Head Only (x 1 viewing) normal both vertically and horizontally with good speed.   Comment HIT negative bil  Positional Testing  Dix-Hallpike Dix-Hallpike Right;Dix-Hallpike Left  Horizontal Canal Testing Horizontal Canal Right;Horizontal Canal Left  Dix-Hallpike Right  Dix-Hallpike Right Duration 0  Dix-Hallpike Right Symptoms No nystagmus  Dix-Hallpike Left  Dix-Hallpike Left Duration 0  Dix-Hallpike Left Symptoms No nystagmus  Horizontal Canal Right  Horizontal Canal  Right Duration 0  Horizontal Canal Right Symptoms Normal  Horizontal Canal Left  Horizontal Canal Left Duration 0  Horizontal Canal Left Symptoms Normal                                     Cognition Arousal/Alertness: Awake/alert Behavior  During Therapy: WFL for tasks assessed/performed Overall Cognitive Status: Within Functional Limits for tasks assessed                                               Pertinent Vitals/Pain Pain Assessment: Faces Faces Pain Scale: Hurts little more Pain Location: bx site right posterior illiac crest Pain Descriptors / Indicators: Grimacing;Guarding Pain Intervention(s): Limited activity within patient's tolerance;Monitored during session;Repositioned           PT Goals (current goals can now be found in the care plan section) Acute Rehab PT Goals Patient Stated Goal: get better and get home Progress towards PT goals: Progressing toward goals    Frequency    Min 3X/week      PT Plan Current plan remains appropriate       AM-PAC PT "6 Clicks" Daily Activity  Outcome Measure  Difficulty turning over in bed (including adjusting bedclothes, sheets and blankets)?: None Difficulty moving from lying on back to sitting on the side of the bed? : None Difficulty sitting down on and standing up from a chair with arms (e.g., wheelchair, bedside commode, etc,.)?: None Help needed moving to and from a bed to chair (including a wheelchair)?: None Help needed walking in hospital room?: None Help needed climbing 3-5 steps with a railing? : None 6 Click Score: 24    End of Session   Activity Tolerance: Patient tolerated treatment well Patient left: in chair;with call bell/phone within reach   PT Visit Diagnosis: Other abnormalities of gait and mobility (R26.89);Dizziness and giddiness (R42)     Time: 1308-6578 PT Time Calculation (min) (ACUTE ONLY): 29 min  Charges:  $Gait Training: 8-22 mins $Therapeutic Activity: 8-22 mins          Coulter Oldaker B. Katy Brickell, PT, DPT [phone]            12/31/2017, 9:29 AM

## 2018-01-01 ENCOUNTER — Inpatient Hospital Stay (HOSPITAL_COMMUNITY): Payer: Medicaid Other

## 2018-01-01 LAB — COMPREHENSIVE METABOLIC PANEL
ALT: 16 U/L (ref 14–54)
AST: 49 U/L — AB (ref 15–41)
Albumin: 2.9 g/dL — ABNORMAL LOW (ref 3.5–5.0)
Alkaline Phosphatase: 119 U/L (ref 38–126)
Anion gap: 10 (ref 5–15)
BUN: 9 mg/dL (ref 6–20)
CHLORIDE: 104 mmol/L (ref 101–111)
CO2: 22 mmol/L (ref 22–32)
CREATININE: 2.13 mg/dL — AB (ref 0.44–1.00)
Calcium: 9.2 mg/dL (ref 8.9–10.3)
GFR calc Af Amer: 31 mL/min — ABNORMAL LOW (ref >60)
GFR calc non Af Amer: 26 mL/min — ABNORMAL LOW (ref >60)
Glucose, Bld: 85 mg/dL (ref 65–99)
Potassium: 4.1 mmol/L (ref 3.5–5.1)
SODIUM: 136 mmol/L (ref 135–145)
Total Bilirubin: 4.9 mg/dL — ABNORMAL HIGH (ref 0.3–1.2)
Total Protein: 7.2 g/dL (ref 6.5–8.1)

## 2018-01-01 LAB — URINALYSIS, ROUTINE W REFLEX MICROSCOPIC
BACTERIA UA: NONE SEEN
Bilirubin Urine: NEGATIVE
Glucose, UA: NEGATIVE mg/dL
Ketones, ur: NEGATIVE mg/dL
NITRITE: NEGATIVE
Protein, ur: NEGATIVE mg/dL
SPECIFIC GRAVITY, URINE: 1.008 (ref 1.005–1.030)
pH: 6 (ref 5.0–8.0)

## 2018-01-01 LAB — CBC WITH DIFFERENTIAL/PLATELET
BASOS ABS: 0 10*3/uL (ref 0.0–0.1)
BASOS PCT: 0 %
Band Neutrophils: 8 %
Blasts: 0 %
EOS PCT: 0 %
Eosinophils Absolute: 0 10*3/uL (ref 0.0–0.7)
HCT: 25.1 % — ABNORMAL LOW (ref 36.0–46.0)
Hemoglobin: 8.1 g/dL — ABNORMAL LOW (ref 12.0–15.0)
LYMPHS ABS: 0.8 10*3/uL (ref 0.7–4.0)
Lymphocytes Relative: 31 %
MCH: 31.9 pg (ref 26.0–34.0)
MCHC: 32.3 g/dL (ref 30.0–36.0)
MCV: 98.8 fL (ref 78.0–100.0)
METAMYELOCYTES PCT: 0 %
MONO ABS: 0.1 10*3/uL (ref 0.1–1.0)
MYELOCYTES: 0 %
Monocytes Relative: 2 %
Neutro Abs: 1.8 10*3/uL (ref 1.7–7.7)
Neutrophils Relative %: 59 %
Other: 0 %
PLATELETS: 107 10*3/uL — AB (ref 150–400)
Promyelocytes Absolute: 0 %
RBC: 2.54 MIL/uL — ABNORMAL LOW (ref 3.87–5.11)
WBC: 2.7 10*3/uL — AB (ref 4.0–10.5)
nRBC: 0 /100 WBC

## 2018-01-01 LAB — LACTATE DEHYDROGENASE: LDH: 213 U/L — ABNORMAL HIGH (ref 98–192)

## 2018-01-01 LAB — GLUCOSE, CAPILLARY: GLUCOSE-CAPILLARY: 121 mg/dL — AB (ref 65–99)

## 2018-01-01 MED ORDER — SODIUM CHLORIDE 0.9 % IV SOLN
INTRAVENOUS | Status: DC
  Administered 2018-01-01 – 2018-01-02 (×2): via INTRAVENOUS

## 2018-01-01 MED ORDER — OXYCODONE HCL 5 MG PO TABS: 5.0000 mg | ORAL_TABLET | Freq: Four times a day (QID) | ORAL | 0 refills | Status: DC | PRN

## 2018-01-01 MED ORDER — PANCRELIPASE (LIP-PROT-AMYL) 36000-114000 UNITS PO CPEP: 72000.0000 [IU] | ORAL_CAPSULE | Freq: Three times a day (TID) | ORAL | 0 refills | Status: DC

## 2018-01-01 MED ORDER — FOLIC ACID 1 MG PO TABS: 1.0000 mg | ORAL_TABLET | Freq: Every day | ORAL | 0 refills | Status: DC

## 2018-01-01 MED ORDER — COLESTIPOL HCL 1 G PO TABS: 1.0000 g | ORAL_TABLET | Freq: Two times a day (BID) | ORAL | 0 refills | Status: DC

## 2018-01-01 MED ORDER — SODIUM CHLORIDE 0.9 % IV BOLUS (SEPSIS)
1000.0000 mL | Freq: Once | INTRAVENOUS | Status: AC
  Administered 2018-01-01: 1000 mL via INTRAVENOUS

## 2018-01-01 MED ORDER — SODIUM CHLORIDE 0.9 % IV SOLN
1.0000 g | INTRAVENOUS | Status: DC
  Administered 2018-01-01: 1 g via INTRAVENOUS
  Filled 2018-01-01: qty 10

## 2018-01-01 MED ORDER — ACETAMINOPHEN 325 MG PO TABS
650.0000 mg | ORAL_TABLET | Freq: Four times a day (QID) | ORAL | Status: DC | PRN
  Administered 2018-01-01 – 2018-01-07 (×12): 650 mg via ORAL
  Filled 2018-01-01 (×14): qty 2

## 2018-01-01 MED ORDER — MORPHINE SULFATE (PF) 2 MG/ML IV SOLN
1.0000 mg | INTRAVENOUS | Status: DC | PRN
  Administered 2018-01-01 – 2018-01-02 (×3): 1 mg via INTRAVENOUS
  Filled 2018-01-01 (×3): qty 1

## 2018-01-01 NOTE — Discharge Summary (Addendum)
Physician Discharge Summary  Jamie Allen FVO:[id number] DOB: 1971/02/19 DOA: 12/23/2017  PCP: Ladell Pier, MD  Admit date: 12/23/2017 Discharge date: 01/01/2018  Admitted From: Home  Disposition:  Home   Recommendations for Outpatient Follow-up:  1. Follow up with PCP in 1-2 weeks 2. Please obtain BMP/CBC in one week 3. Please follow up on the following pending results: BM biopsy results.  4. Patient to follow with Dr Marin Olp, for further care of anemia.     Discharge Condition: stable.  CODE STATUS: full code.  Diet recommendation: Heart Healthy  Brief/Interim Summary:  Brief Narrative by Dr Lonny Prude: "'Jamie Leigh Sheeneis a 47 y.o.femalewith medical history significant ofcirrhosis (unclear if NASH vs Alcoholic), GERD, HTN, Anemia, andrecurrent C diff(s/p offecal transplant),alcohol abuse, tobacco abuse, GERD, hypothyroidism, depression, anxiety, drug-seeking behavior. She presents with fevers/chills/nausea and vomiting concerning for sepsis with shock. Also with significant anemia of unknown cause. S/p 4 units of PRBC to date.No definite source".    Assessment & Plan:   Principal Problem:   Septic shock ([place]) Active Problems:   Anxiety and depression   Hypothyroidism   GERD (gastroesophageal reflux disease)   Abdominal pain   C. difficile enteritis   Alcoholic cirrhosis of liver (HCC)   Tobacco abuse   Macrocytic anemia   Thrombocytopenia (HCC)   Alcohol abuse  Addendum;  Discharge cancelled. patient has fever of 100.6. Shaking.  Will check blood cultures, UA, chest x ray. Urine culture.  IV fluids.  Tylenol once.  Came to see patient, she report dysuria. Will check urine culture. Start ceftriaxone.   1-Septic Shock;  Received 3 days of Zosyn and 4 days of cefuroxime. Will observed off antibiotics.  [name] no significant ascites, pelvic US small amount of physiologic fluid.    Anemia;  B 12, folic acid normal. Cold agglutinin titter  negative.  No evidence of active bleeding. Received 4 units of PRBC Appreciate Dr Marin Olp help. Plan for BM biopsy.  Hb increase to 8. Plan to discharge patient with close follow up  Alcoholic Cirrhosis;  Last drink a week ago.  LFT stable. , bili decreased.    History of C dif Enteritis;  C diff negative.   Thrombocytopenia Secondary to cirrhosis. Stable.   Vancomycin-induced nephrotoxicity Vanc trough resulted was 56 (elevated). Vancomycin discontinued. No hydronephrosis on renal ultrasound. Creatininecontinuesto improve.  Renal function slowly improving. Cr at 2.2.  Cr stable at 2. Needs close follow up.   Peripheral neuropathy. On lyrica, dose reduce due to AKI.   GERD -Continue Protonix  Hypothyroidism -Continue Synthroid  Chronic pain, right side abdomen, for 3 years now. Advised to follow up with PCP for evaluation. She was started on Oxycodone by prior Hospitalist . I reviewed [name] data bases. Explain to patient this medication (oxycodone , should be for short period of time.     Discharge Diagnoses:  Principal Problem:   Septic shock ([place]) Active Problems:   Anxiety and depression   Hypothyroidism   GERD (gastroesophageal reflux disease)   Abdominal pain   C. difficile enteritis   Alcoholic cirrhosis of liver (HCC)   Tobacco abuse   Macrocytic anemia   Thrombocytopenia (HCC)   Alcohol abuse    Discharge Instructions  Discharge Instructions    Diet - low sodium heart healthy   Complete by:  As directed    Increase activity slowly   Complete by:  As directed      Allergies as of 01/01/2018      Reactions  Iohexol Hives, Itching, Swelling   Morphine And Related Itching, Other (See Comments)   Can tolerate with Benadryl      Medication List    STOP taking these medications   feeding supplement Liqd   gabapentin 300 MG capsule Commonly known as:  NEURONTIN   PEG-KCl-NaCl-NaSulf-Na Asc-C 140 g Solr Commonly known as:  PLENVU    promethazine 25 MG tablet Commonly known as:  PHENERGAN     TAKE these medications   colestipol 1 g tablet Commonly known as:  COLESTID Take 1 tablet (1 g total) by mouth 2 (two) times daily.   dicyclomine 10 MG capsule Commonly known as:  BENTYL Take 1 capsule (10 mg total) by mouth 3 (three) times daily before meals. What changed:    when to take this  reasons to take this   esomeprazole 20 MG capsule Commonly known as:  NEXIUM Take 20 mg by mouth daily at 12 noon.   folic acid 1 MG tablet Commonly known as:  FOLVITE Take 1 tablet (1 mg total) by mouth daily.   hydrOXYzine 10 MG tablet Commonly known as:  ATARAX/VISTARIL Take 1 tablet (10 mg total) by mouth 2 (two) times daily.   levothyroxine 75 MCG tablet Commonly known as:  SYNTHROID, LEVOTHROID Take 1 tablet (75 mcg total) by mouth daily before breakfast.   lipase/protease/amylase 36000 UNITS Cpep capsule Commonly known as:  CREON Take 2 capsules (72,000 Units total) by mouth 3 (three) times daily with meals.   pregabalin 50 MG capsule Commonly known as:  LYRICA Take 1 capsule (50 mg total) by mouth 3 (three) times daily.       Allergies  Allergen Reactions  . Iohexol Hives, Itching and Swelling  . Morphine And Related Itching and Other (See Comments)    Can tolerate with Benadryl    Consultations:  Dr Marin Olp.    Procedures/Studies: Ct Abdomen Pelvis Wo Contrast  Result Date: 12/24/2017 CLINICAL DATA:  Status post fecal transplant with fever and chills EXAM: CT ABDOMEN AND PELVIS WITHOUT CONTRAST TECHNIQUE: Multidetector CT imaging of the abdomen and pelvis was performed following the standard protocol without IV contrast. COMPARISON:  09/25/2017, 09/20/2017, 06/28/2017, 04/28/2017, and multiple prior CT abdomen pelvis. FINDINGS: Lower chest: Visible lung bases demonstrate patchy dependent atelectasis. No acute consolidation or pleural effusion. Normal heart size. Hepatobiliary: Cirrhotic appearing  liver with nodular contour. Enlarged caudate lobe. Surgical clips in the gallbladder fossa. No biliary dilatation. Pancreas: Unremarkable. No pancreatic ductal dilatation or surrounding inflammatory changes. Spleen: Enlarged Adrenals/Urinary Tract: Adrenal glands are within normal limits. Mildly atrophic left kidney with cortical scarring present at the mid and upper pole. No hydronephrosis. The bladder is normal Stomach/Bowel: The stomach is nonenlarged. No dilated small bowel. No significant colon wall thickening. Normal appendix. Vascular/Lymphatic: Mild aortic atherosclerosis. No aneurysmal dilatation. No significantly enlarged lymph nodes. Reproductive: Uterus and bilateral adnexa are unremarkable. Other: Negative for free air or free fluid. Stable mild soft tissue stranding/edema at the root of the mesentery. Stable minimal edema and soft tissue thickening in the right gutter. Musculoskeletal: No acute or significant osseous findings. IMPRESSION: 1. No acute interval changes since comparison CT. Mild soft tissue thickening and edema in the right gutter 2. Morphologic changes consistent with cirrhosis.  Splenomegaly. 3. Mildly atrophic left kidney with scarring. Electronically Signed   By: Donavan Foil M.D.   On: 12/24/2017 02:23   Dg Chest 2 View  Result Date: 12/23/2017 CLINICAL DATA:  Cough congestion and fever EXAM: CHEST - 2  VIEW COMPARISON:  09/20/2017 FINDINGS: The heart size and mediastinal contours are within normal limits. Linear scarring or atelectasis at the left base. Normal heart size. No pneumothorax. IMPRESSION: No active cardiopulmonary disease. Linear scarring or atelectasis at the left lung base. Electronically Signed   By: Donavan Foil M.D.   On: 12/23/2017 21:10   US Renal  Result Date: 12/26/2017 CLINICAL DATA:  Acute kidney injury EXAM: RENAL / URINARY TRACT ULTRASOUND COMPLETE COMPARISON:  None. FINDINGS: Right Kidney: Length: 12.8 cm. Echogenicity within normal limits. No mass  or hydronephrosis visualized. Trace perinephric fluid adjacent to the upper pole. Left Kidney: Length: 9.8 cm. Atrophic upper pole, compatible with the appearance on CT of 12/24/2017. Echogenicity within normal limits. No mass or hydronephrosis visualized. Bladder: Decompressed by Foley catheter. Splenomegaly, also demonstrated on earlier CT. IMPRESSION: 1. No acute findings. No hydronephrosis. Trace perinephric fluid adjacent to the upper pole of the right kidney is of uncertain significance. 2. Partially atrophic (upper pole) left kidney, as seen on multiple prior abdomen CTs. 3. Splenomegaly, as also described on previous CT (likely related to previously described cirrhosis). Electronically Signed   By: Franki Cabot M.D.   On: 12/26/2017 15:22   US Abdomen Limited  Result Date: 12/28/2017 CLINICAL DATA:  Abdominal distension EXAM: LIMITED ABDOMEN ULTRASOUND FOR ASCITES TECHNIQUE: Limited ultrasound survey for ascites was performed in all four abdominal quadrants. COMPARISON:  None. FINDINGS: Minimal free fluid is noted surrounding the falciform ligament. No sizable amount of ascites is noted. IMPRESSION: Minimal free fluid in the abdomen. Electronically Signed   By: Inez Catalina M.D.   On: 12/28/2017 19:44   Dg Abd Portable 1v  Result Date: 12/28/2017 CLINICAL DATA:  Distended abdomen EXAM: PORTABLE ABDOMEN - 1 VIEW COMPARISON:  CT 12/24/2017 FINDINGS: Normal bowel gas pattern. Surgical clips in the gallbladder fossa. Foley catheter. No renal calculi. IMPRESSION: Negative. Electronically Signed   By: Franchot Gallo M.D.   On: 12/28/2017 12:54   Ct Bone Marrow Biopsy & Aspiration  Result Date: 12/30/2017 INDICATION: Cirrhosis, chronic anemia EXAM: CT GUIDED RIGHT ILIAC BONE MARROW ASPIRATION AND CORE BIOPSY Date:  12/30/2017 12/30/2017 11:40 am Radiologist:  Jerilynn Mages. Daryll Brod, MD Guidance:  CT FLUOROSCOPY TIME:  Fluoroscopy Time: NONE. MEDICATIONS: None. ANESTHESIA/SEDATION: 2.0 mg IV Versed; 150 mcg IV  Fentanyl Moderate Sedation Time:  9 minutes The patient was continuously monitored during the procedure by the interventional radiology nurse under my direct supervision. CONTRAST:  None. COMPLICATIONS: None PROCEDURE: Informed consent was obtained from the patient following explanation of the procedure, risks, benefits and alternatives. The patient understands, agrees and consents for the procedure. All questions were addressed. A time out was performed. The patient was positioned prone and non-contrast localization CT was performed of the pelvis to demonstrate the iliac marrow spaces. Maximal barrier sterile technique utilized including caps, mask, sterile gowns, sterile gloves, large sterile drape, hand hygiene, and Betadine prep. Under sterile conditions and local anesthesia, an 11 gauge coaxial bone biopsy needle was advanced into the right iliac marrow space. Needle position was confirmed with CT imaging. Initially, bone marrow aspiration was performed. Next, the 11 gauge outer cannula was utilized to obtain a right iliac bone marrow core biopsy. Needle was removed. Hemostasis was obtained with compression. The patient tolerated the procedure well. Samples were prepared with the cytotechnologist. No immediate complications. IMPRESSION: CT guided right iliac bone marrow aspiration and core biopsy. Electronically Signed   By: Jerilynn Mages.  Shick M.D.   On: 12/30/2017 11:43  US Pelvic Complete With Transvaginal  Result Date: 12/27/2017 CLINICAL DATA:  Bilateral pelvic pain for 1 year EXAM: TRANSABDOMINAL AND TRANSVAGINAL ULTRASOUND OF PELVIS TECHNIQUE: Both transabdominal and transvaginal ultrasound examinations of the pelvis were performed. Transabdominal technique was performed for global imaging of the pelvis including uterus, ovaries, adnexal regions, and pelvic cul-de-sac. It was necessary to proceed with endovaginal exam following the transabdominal exam to visualize the endometrial complex and adnexal  structures. COMPARISON:  None FINDINGS: Uterus Measurements: 5.4 x 3.1 x 3.6 cm. No fibroids or other mass visualized. Endometrium Thickness: 4 mm.  No focal abnormality visualized. Right ovary Not seen. No mass appreciated in the right adnexa. Small amount of simple appearing free fluid in the right adnexa, likely physiologic. Left ovary Measurements: 2.7 x 1.6 x 1.7 cm. Normal appearance/no adnexal mass. Trace free fluid in the left adnexa, also likely physiologic. Other findings No abnormal free fluid. IMPRESSION: 1. Overall, no acute or significant findings. 2. Uterus appears normal. 3. Left ovary appears normal. 4. Right ovary is not seen but there is no mass or other significant abnormality in the right adnexal region. 5. Small amount of simple appearing free fluid in the adnexal regions, right greater than left, likely physiologic. Electronically Signed   By: Franki Cabot M.D.   On: 12/27/2017 10:48      Subjective: She is feeling well.  Chronic right side lower quadrant pain.   Discharge Exam: Vitals:   01/01/18 0619 01/01/18 0851  BP: (!) 108/51 (!) 96/31  Pulse: 86 82  Resp: 20   Temp: 100.2 F (37.9 C) 98.4 F (36.9 C)  SpO2: 93%    Vitals:   12/31/17 1818 12/31/17 2144 01/01/18 0619 01/01/18 0851  BP: 118/84 (!) 97/50 (!) 108/51 (!) 96/31  Pulse: 94 (!) 101 86 82  Resp: _0 Temp: 98.7 F (37.1 C) 99.3 F (37.4 C) 100.2 F (37.9 C) 98.4 F (36.9 C)  TempSrc: Oral Oral Oral Oral  SpO2: 96% 100% 93%   Weight:      Height:        General: Pt is alert, awake, not in acute distress Cardiovascular: RRR, S1/S2 +, no rubs, no gallops Respiratory: CTA bilaterally, no wheezing, no rhonchi Abdominal: Soft, NT, ND, bowel sounds + Extremities: no edema, no cyanosis    The results of significant diagnostics from this hospitalization (including imaging, microbiology, ancillary and laboratory) are listed below for reference.     Microbiology: Recent Results (from  the past 240 hour(s))  Blood culture (routine x 2)     Status: None   Collection Time: 12/23/17  7:50 PM  Result Value Ref Range Status   Specimen Description BLOOD RIGHT HAND  Final   Special Requests   Final    IN PEDIATRIC BOTTLE Blood Culture results may not be optimal due to an excessive volume of blood received in culture bottles   Culture   Final    NO GROWTH 5 DAYS Performed at [facility], [place] [address], [place], [place] [address]    Report Status 12/28/2017 FINAL  Final  Blood culture (routine x 2)     Status: None   Collection Time: 12/23/17  7:50 PM  Result Value Ref Range Status   Specimen Description BLOOD LEFT ANTECUBITAL  Final   Special Requests   Final    BOTTLES DRAWN AEROBIC AND ANAEROBIC Blood Culture adequate volume   Culture   Final    NO GROWTH 5 DAYS  Performed at [facility], [place] [address], [place], [place] [address]    Report Status 12/28/2017 FINAL  Final  Stool culture (children & immunocomp patients)     Status: None   Collection Time: 12/24/17  2:53 AM  Result Value Ref Range Status   Salmonella/Shigella Screen Final report  Final   Campylobacter Culture Final report  Final   E coli, Shiga toxin Assay Negative Negative Final    Comment: (NOTE) Performed At: [facility] [address] [place], [place] [id number] Rush Farmer MD [id number] Performed at [facility], [place] [address], [place], [place] [address]   C difficile quick scan w PCR reflex     Status: None   Collection Time: 12/24/17  2:53 AM  Result Value Ref Range Status   C Diff antigen NEGATIVE NEGATIVE Final   C Diff toxin NEGATIVE NEGATIVE Final   C Diff interpretation No C. difficile detected.  Final    Comment: Performed at [facility], [place] [address] [place], [place] [address]  STOOL CULTURE REFLEX - RSASHR     Status: None   Collection Time: 12/24/17  2:53 AM  Result Value Ref Range Status   Stool Culture result 1 (RSASHR) Comment   Final    Comment: (NOTE) No Salmonella or Shigella recovered. Performed At: [facility] [place], [place] [id number] Rush Farmer MD IN:[id number] Performed at [facility], [place] [address], [place], [place] [address]   STOOL CULTURE Reflex - CMPCXR     Status: None   Collection Time: 12/24/17  2:53 AM  Result Value Ref Range Status   Stool Culture result 1 (CMPCXR) Comment  Final    Comment: (NOTE) No Campylobacter species isolated. Performed At: [facility] [place], [place] [id number] Rush Farmer MD YY:[id number] Performed at [facility], [place] [address], [place], [place] [address]   Urine culture     Status: None   Collection Time: 12/24/17  3:30 AM  Result Value Ref Range Status   Specimen Description URINE, RANDOM  Final   Special Requests NONE  Final   Culture   Final    NO GROWTH Performed at [facility], [place] [address], [place], [place] [address]    Report Status 12/25/2017 FINAL  Final  Respiratory Panel by PCR     Status: None   Collection Time: 12/24/17  6:20 AM  Result Value Ref Range Status   Adenovirus NOT DETECTED NOT DETECTED Final   Coronavirus 229E NOT DETECTED NOT DETECTED Final   Coronavirus HKU1 NOT DETECTED NOT DETECTED Final   Coronavirus NL63 NOT DETECTED NOT DETECTED Final   Coronavirus OC43 NOT DETECTED NOT DETECTED Final   Metapneumovirus NOT DETECTED NOT DETECTED Final   Rhinovirus / Enterovirus NOT DETECTED NOT DETECTED Final   Influenza A NOT DETECTED NOT DETECTED Final   Influenza B NOT DETECTED NOT DETECTED Final   Parainfluenza Virus 1 NOT DETECTED NOT DETECTED Final   Parainfluenza Virus 2 NOT DETECTED NOT DETECTED Final   Parainfluenza Virus 3 NOT DETECTED NOT DETECTED Final   Parainfluenza Virus 4 NOT DETECTED NOT DETECTED Final   Respiratory Syncytial Virus NOT DETECTED NOT DETECTED Final   Bordetella pertussis NOT DETECTED NOT DETECTED Final   Chlamydophila  pneumoniae NOT DETECTED NOT DETECTED Final   Mycoplasma pneumoniae NOT DETECTED NOT DETECTED Final  MRSA PCR Screening     Status: None   Collection Time: 12/25/17  5:12 AM  Result Value Ref Range Status   MRSA by PCR NEGATIVE NEGATIVE Final    Comment:        The GeneXpert MRSA Assay (FDA approved for NASAL specimens only), is one component of a comprehensive MRSA colonization surveillance program. It is not intended to diagnose MRSA infection nor to guide or monitor treatment for MRSA infections. Performed at [facility], [place] [address], [place], [place] [address]      Labs: BNP (last 3 results) Recent Labs    01/02/17 2022  BNP 06.2   Basic Metabolic Panel: Recent Labs  Lab 12/28/17 0605 12/29/17 0521 12/30/17 0515 12/31/17 0324 01/01/18 0521  NA 136 139 136 135 136  K 3.8 4.1 4.1 4.2 4.1  CL 104 108 105 104 104  CO2 18* 18* 21* 22 22  GLUCOSE 76 92 82 87 85  BUN _0 CREATININE 3.09* 2.52* 2.20* 2.21* 2.13*  CALCIUM 8.4* 8.5* 8.8* 8.5* 9.2   Liver Function Tests: Recent Labs  Lab 12/28/17 0605 12/29/17 0521 12/30/17 0515 12/31/17 0324 01/01/18 0521  AST 47* 43* 50* 46* 49*  ALT _1 ALKPHOS 122 112 120 104 119  BILITOT 3.9* 4.0* 5.0* 4.4* 4.9*  PROT 7.5 7.1 7.5 6.8 7.2  ALBUMIN 2.8* 2.7* 3.0* 2.8* 2.9*   No results for input(s): LIPASE, AMYLASE in the last 168 hours. No results for input(s): AMMONIA in the last 168 hours. CBC: Recent Labs  Lab 12/28/17 0605 12/29/17 0521 12/29/17 0746 12/29/17 2126 12/30/17 0515 12/31/17 0324 01/01/18 0521  WBC 3.7* 3.3*  --   --  3.9* 3.4* 2.7*  NEUTROABS 2.4 2.2  --   --  2.6 1.9 PENDING  HGB 7.9* 6.9* 6.7* 8.0* 8.0* 7.3* 8.1*  HCT 23.6* 21.3* 21.1* 24.8* 25.2* 22.8* 25.1*  MCV 107.3* 107.6*  --   --  98.8 99.1 98.8  PLT 86* 90*  --   --  102* 101* 107*   Cardiac Enzymes: No results for input(s): CKTOTAL, CKMB, CKMBINDEX, TROPONINI in the last 168 hours. BNP: Invalid  input(s): POCBNP CBG: Recent Labs  Lab 12/28/17 0803 12/29/17 0740 12/30/17 0759 12/31/17 0752 01/01/18 0809  GLUCAP 93 106* 85 90 121*   D-Dimer No results for input(s): DDIMER in the last 72 hours. Hgb A1c No results for input(s): HGBA1C in the last 72 hours. Lipid Profile No results for input(s): CHOL, HDL, LDLCALC, TRIG, CHOLHDL, LDLDIRECT in the last 72 hours. Thyroid function studies No results for input(s): TSH, T4TOTAL, T3FREE, THYROIDAB in the last 72 hours.  Invalid input(s): FREET3 Anemia work up Recent Labs    12/30/17 0515  RETICCTPCT 4.3*   Urinalysis    Component Value Date/Time   COLORURINE YELLOW 12/24/2017 0315   APPEARANCEUR CLEAR 12/24/2017 0315   LABSPEC 1.005 12/24/2017 0315   PHURINE 6.0 12/24/2017 0315   GLUCOSEU NEGATIVE 12/24/2017 0315   HGBUR SMALL (A) 12/24/2017 0315   BILIRUBINUR NEGATIVE 12/24/2017 0315   BILIRUBINUR negative 05/19/2016 1638   KETONESUR NEGATIVE 12/24/2017 0315   PROTEINUR NEGATIVE 12/24/2017 0315   UROBILINOGEN 0.2 05/19/2016 1638   UROBILINOGEN 0.2 08/17/2015 1853   NITRITE NEGATIVE 12/24/2017 0315   LEUKOCYTESUR NEGATIVE 12/24/2017 0315   Sepsis Labs Invalid input(s): PROCALCITONIN,  WBC,  LACTICIDVEN Microbiology Recent Results (from the past 240 hour(s))  Blood culture (routine x 2)     Status: None   Collection Time: 12/23/17  7:50 PM  Result Value  Ref Range Status   Specimen Description BLOOD RIGHT HAND  Final   Special Requests   Final    IN PEDIATRIC BOTTLE Blood Culture results may not be optimal due to an excessive volume of blood received in culture bottles   Culture   Final    NO GROWTH 5 DAYS Performed at [facility], [place] [address], [place], [place] [address]    Report Status 12/28/2017 FINAL  Final  Blood culture (routine x 2)     Status: None   Collection Time: 12/23/17  7:50 PM  Result Value Ref Range Status   Specimen Description BLOOD LEFT ANTECUBITAL  Final   Special Requests    Final    BOTTLES DRAWN AEROBIC AND ANAEROBIC Blood Culture adequate volume   Culture   Final    NO GROWTH 5 DAYS Performed at [facility], [place] [address] [place], [place] [address]    Report Status 12/28/2017 FINAL  Final  Stool culture (children & immunocomp patients)     Status: None   Collection Time: 12/24/17  2:53 AM  Result Value Ref Range Status   Salmonella/Shigella Screen Final report  Final   Campylobacter Culture Final report  Final   E coli, Shiga toxin Assay Negative Negative Final    Comment: (NOTE) Performed At: [facility] [address] [place], [place] [id number] Rush Farmer MD [id number] Performed at [facility], [place] [address] [place], [place] [address]   C difficile quick scan w PCR reflex     Status: None   Collection Time: 12/24/17  2:53 AM  Result Value Ref Range Status   C Diff antigen NEGATIVE NEGATIVE Final   C Diff toxin NEGATIVE NEGATIVE Final   C Diff interpretation No C. difficile detected.  Final    Comment: Performed at [facility], [place] [address] [place], [place] [address]  STOOL CULTURE REFLEX - RSASHR     Status: None   Collection Time: 12/24/17  2:53 AM  Result Value Ref Range Status   Stool Culture result 1 (RSASHR) Comment  Final    Comment: (NOTE) No Salmonella or Shigella recovered. Performed At: [facility] [place], [place] [id number] Rush Farmer MD [id number] Performed at [facility], [place] [address], [place], [place] [address]   STOOL CULTURE Reflex - CMPCXR     Status: None   Collection Time: 12/24/17  2:53 AM  Result Value Ref Range Status   Stool Culture result 1 (CMPCXR) Comment  Final    Comment: (NOTE) No Campylobacter species isolated. Performed At: [facility] [place], [place] [id number] Rush Farmer MD [id number] Performed at [facility], [place] [address] [place], [place] [address]   Urine culture      Status: None   Collection Time: 12/24/17  3:30 AM  Result Value Ref Range Status   Specimen Description URINE, RANDOM  Final   Special Requests NONE  Final   Culture   Final    NO GROWTH Performed at [facility], [place] [address] [place], [place] [address]    Report Status 12/25/2017 FINAL  Final  Respiratory Panel by PCR     Status: None   Collection Time: 12/24/17  6:20 AM  Result Value Ref Range Status   Adenovirus NOT DETECTED NOT DETECTED Final   Coronavirus 229E NOT DETECTED NOT DETECTED Final   Coronavirus HKU1 NOT DETECTED NOT DETECTED Final  Coronavirus NL63 NOT DETECTED NOT DETECTED Final   Coronavirus OC43 NOT DETECTED NOT DETECTED Final   Metapneumovirus NOT DETECTED NOT DETECTED Final   Rhinovirus / Enterovirus NOT DETECTED NOT DETECTED Final   Influenza A NOT DETECTED NOT DETECTED Final   Influenza B NOT DETECTED NOT DETECTED Final   Parainfluenza Virus 1 NOT DETECTED NOT DETECTED Final   Parainfluenza Virus 2 NOT DETECTED NOT DETECTED Final   Parainfluenza Virus 3 NOT DETECTED NOT DETECTED Final   Parainfluenza Virus 4 NOT DETECTED NOT DETECTED Final   Respiratory Syncytial Virus NOT DETECTED NOT DETECTED Final   Bordetella pertussis NOT DETECTED NOT DETECTED Final   Chlamydophila pneumoniae NOT DETECTED NOT DETECTED Final   Mycoplasma pneumoniae NOT DETECTED NOT DETECTED Final  MRSA PCR Screening     Status: None   Collection Time: 12/25/17  5:12 AM  Result Value Ref Range Status   MRSA by PCR NEGATIVE NEGATIVE Final    Comment:        The GeneXpert MRSA Assay (FDA approved for NASAL specimens only), is one component of a comprehensive MRSA colonization surveillance program. It is not intended to diagnose MRSA infection nor to guide or monitor treatment for MRSA infections. Performed at [facility], [place] [address], [place], [place] [address]      Time coordinating discharge: Over 30 minutes  SIGNED:   Elmarie Shiley, MD  [facility] 01/01/2018, 10:07 AM Pager   If 7PM-7AM, please contact night-coverage [email or web address] Password [email or web address]

## 2018-01-01 NOTE — Progress Notes (Signed)
Pt BP 100/34, MAP 50, Pulse 79. Pt resting, experiencing no symptoms. On call MD notified. Will continue to monitor.

## 2018-01-01 NOTE — Progress Notes (Signed)
Ms.  Jamie Allen is doing well.  She feels okay.  The bone marrow report still is not back yet.  I would not expect that it would be back until early next week     Her labs show a hemoglobin of 8.1.  This came up from 7.3..  Her renal function continues to improve.  The creatinine is now down to 2.13.  Her LDH is 213.  There is no bleeding.  Hopefully, she will be able to go home.  We can try to follow her up as an outpatient.  I think this would be reasonable to do.  Of note, the only real abnormality that we found was that her erythropoietin level is low for her degree of anemia.  As always, we have good fellowship.  She does have a very strong faith.    Lattie Haw, MD  Hebrews 12:12

## 2018-01-01 NOTE — Care Management Note (Signed)
Case Management Note  Patient Details  Name: Vaness Jelinski MRN: [id number] Date of Birth: 05-14-1971  Subjective/Objective:                 Admitted with Septic shock, hx of cirrhosis (unclear if NASH vs Alcoholic), GERD, HTN, Anemia, andrecurrent C diff(s/p offecal transplant),alcohol abuse, tobacco abuse, GERD, hypothyroidism, depression, anxiety, drug-seeking behavior.  PCP: [facility]/ Karle Plumber  Action/Plan: Transition to home today.Pt to call on Monday to scheduled hospital follow up appointment for 01/13/2018 at the [facility]. Pt states husband to provide transportation to home.  Expected Discharge Date:  01/01/18               Expected Discharge Plan:     In-House Referral:     Discharge planning Services   home /self care  Post Acute Care Choice:    Choice offered to:     DME Arranged:   n/a DME Agency:   n/a  HH Arranged:   n/a HH Agency:   n/a  Status of Service:   completed  If discussed at Long Length of Stay Meetings, dates discussed:    Additional Comments:  Sharin Mons, RN 01/01/2018, 11:37 AM

## 2018-01-02 DIAGNOSIS — K648 Other hemorrhoids: Secondary | ICD-10-CM

## 2018-01-02 DIAGNOSIS — K625 Hemorrhage of anus and rectum: Secondary | ICD-10-CM

## 2018-01-02 LAB — PREPARE RBC (CROSSMATCH)

## 2018-01-02 LAB — CBC WITH DIFFERENTIAL/PLATELET
BASOS PCT: 1 %
Basophils Absolute: 0 10*3/uL (ref 0.0–0.1)
EOS PCT: 1 %
Eosinophils Absolute: 0 10*3/uL (ref 0.0–0.7)
HEMATOCRIT: 21.1 % — AB (ref 36.0–46.0)
Hemoglobin: 7 g/dL — ABNORMAL LOW (ref 12.0–15.0)
LYMPHS ABS: 0.5 10*3/uL — AB (ref 0.7–4.0)
Lymphocytes Relative: 15 %
MCH: 33.3 pg (ref 26.0–34.0)
MCHC: 33.2 g/dL (ref 30.0–36.0)
MCV: 100.5 fL — AB (ref 78.0–100.0)
MONOS PCT: 5 %
Monocytes Absolute: 0.2 10*3/uL (ref 0.1–1.0)
NEUTROS ABS: 2.3 10*3/uL (ref 1.7–7.7)
Neutrophils Relative %: 78 %
Platelets: 99 10*3/uL — ABNORMAL LOW (ref 150–400)
RBC: 2.1 MIL/uL — ABNORMAL LOW (ref 3.87–5.11)
WBC: 3 10*3/uL — ABNORMAL LOW (ref 4.0–10.5)

## 2018-01-02 LAB — COMPREHENSIVE METABOLIC PANEL
ALT: 16 U/L (ref 14–54)
ANION GAP: 10 (ref 5–15)
AST: 44 U/L — AB (ref 15–41)
Albumin: 2.8 g/dL — ABNORMAL LOW (ref 3.5–5.0)
Alkaline Phosphatase: 98 U/L (ref 38–126)
BILIRUBIN TOTAL: 4.7 mg/dL — AB (ref 0.3–1.2)
BUN: 7 mg/dL (ref 6–20)
CO2: 19 mmol/L — ABNORMAL LOW (ref 22–32)
Calcium: 8.7 mg/dL — ABNORMAL LOW (ref 8.9–10.3)
Chloride: 106 mmol/L (ref 101–111)
Creatinine, Ser: 2.02 mg/dL — ABNORMAL HIGH (ref 0.44–1.00)
GFR calc Af Amer: 33 mL/min — ABNORMAL LOW (ref >60)
GFR calc non Af Amer: 28 mL/min — ABNORMAL LOW (ref >60)
Glucose, Bld: 102 mg/dL — ABNORMAL HIGH (ref 65–99)
POTASSIUM: 4 mmol/L (ref 3.5–5.1)
Sodium: 135 mmol/L (ref 135–145)
Total Protein: 6.7 g/dL (ref 6.5–8.1)

## 2018-01-02 LAB — HEMOGLOBIN AND HEMATOCRIT, BLOOD
HCT: 25.9 % — ABNORMAL LOW (ref 36.0–46.0)
HCT: 23.3 % — ABNORMAL LOW (ref 36.0–46.0)
HEMOGLOBIN: 7.6 g/dL — AB (ref 12.0–15.0)
Hemoglobin: 8.5 g/dL — ABNORMAL LOW (ref 12.0–15.0)

## 2018-01-02 LAB — BPAM RBC
Blood Product Expiration Date: 201904022359
Blood Product Expiration Date: 201904032359
ISSUE DATE / TIME: 201903121534
UNIT TYPE AND RH: 9500
Unit Type and Rh: 9500

## 2018-01-02 LAB — TYPE AND SCREEN
ABO/RH(D): O NEG
Antibody Screen: POSITIVE
DAT, IGG: NEGATIVE
Donor AG Type: NEGATIVE
UNIT DIVISION: 0
Unit division: 0

## 2018-01-02 LAB — LACTIC ACID, PLASMA
LACTIC ACID, VENOUS: 1.5 mmol/L (ref 0.5–1.9)
Lactic Acid, Venous: 3.4 mmol/L (ref 0.5–1.9)

## 2018-01-02 LAB — GLUCOSE, CAPILLARY: GLUCOSE-CAPILLARY: 122 mg/dL — AB (ref 65–99)

## 2018-01-02 LAB — LACTATE DEHYDROGENASE: LDH: 199 U/L — ABNORMAL HIGH (ref 98–192)

## 2018-01-02 MED ORDER — HYDROCORTISONE ACETATE 25 MG RE SUPP
25.0000 mg | Freq: Two times a day (BID) | RECTAL | Status: DC
  Administered 2018-01-02 – 2018-01-06 (×3): 25 mg via RECTAL
  Filled 2018-01-02 (×8): qty 1

## 2018-01-02 MED ORDER — SODIUM CHLORIDE 0.9 % IV BOLUS (SEPSIS)
250.0000 mL | Freq: Once | INTRAVENOUS | Status: AC
  Administered 2018-01-02: 250 mL via INTRAVENOUS

## 2018-01-02 MED ORDER — ACETAMINOPHEN 325 MG PO TABS
325.0000 mg | ORAL_TABLET | Freq: Once | ORAL | Status: AC
  Administered 2018-01-02: 325 mg via ORAL
  Filled 2018-01-02: qty 1

## 2018-01-02 MED ORDER — PANTOPRAZOLE SODIUM 40 MG IV SOLR
40.0000 mg | Freq: Two times a day (BID) | INTRAVENOUS | Status: DC
  Administered 2018-01-02 – 2018-01-07 (×11): 40 mg via INTRAVENOUS
  Filled 2018-01-02 (×11): qty 40

## 2018-01-02 MED ORDER — SODIUM CHLORIDE 0.9 % IV SOLN: 2.0000 g | INTRAVENOUS | Status: DC

## 2018-01-02 MED ORDER — SODIUM CHLORIDE 0.9 % IV SOLN
Freq: Once | INTRAVENOUS | Status: AC
  Administered 2018-01-02: 10:00:00 via INTRAVENOUS

## 2018-01-02 MED ORDER — SODIUM CHLORIDE 0.9 % IV BOLUS (SEPSIS)
500.0000 mL | Freq: Once | INTRAVENOUS | Status: AC
  Administered 2018-01-02: 500 mL via INTRAVENOUS

## 2018-01-02 MED ORDER — SODIUM CHLORIDE 0.9 % IV SOLN
2.0000 g | INTRAVENOUS | Status: DC
  Administered 2018-01-02 – 2018-01-08 (×7): 2 g via INTRAVENOUS
  Filled 2018-01-02 (×8): qty 20

## 2018-01-02 MED ORDER — SODIUM CHLORIDE 0.9 % IV SOLN
Freq: Once | INTRAVENOUS | Status: AC
  Administered 2018-01-02: 14:00:00 via INTRAVENOUS

## 2018-01-02 NOTE — Consult Note (Signed)
PULMONARY / CRITICAL CARE MEDICINE   Name: Jamie Allen MRN: [id number] DOB: 12-Mar-1971    ADMISSION DATE:  12/23/2017 CONSULTATION DATE:  01/02/18  REFERRING MD:  Dr Tyrell Antonio  Reason for consult: patient looks worse  HISTORY OF PRESENT ILLNESS:   47 year old woman known to [name] from earlier this hospitalization.  She has a history of cirrhosis due to alcohol and possibly a contribution of [place], recurrent C. difficile colitis was admitted 10 days ago with fever, nausea, vomiting, diarrhea.  Found to have symptomatic anemia with a hemoglobin of 5.4, hypotension to the 90s.  She was treated empirically for possible C. difficile and her subsequent testing was negative.  She received blood products.  Blood pressure stabilized.  We are call back to see her today for clinical change and worsening.  Started having diarrhea today that she described to me as maroon.  Her hemoglobin dropped 1 g and she received 1 unit packed red blood cells today.  She is also had fever and dysuria.  UA is positive for a probable urinary tract infection.  Ceftriaxone was started.  She received 1 L of IV fluids.  Her blood pressures been hovering in the 782 systolic which is close to her baseline based on her review of her chart.  She did describe some transient change in her mental status-she could not remember that her husband had been visiting today.  This is resolved.    PAST MEDICAL HISTORY :  She  has a past medical history of Alcoholic cirrhosis of liver ([place]) (09/22/2017), Allergy, Anemia, Antral gastritis (2015), Anxiety, C. difficile diarrhea (02/02/2014), Chronic cholecystitis with calculus s/p lap cholecystectomy 12/25/2016 (12/24/2016), Colitis (01-03-14), Drug-seeking behavior, Foot fracture, left (10/06/2015), GERD (gastroesophageal reflux disease), Headache(784.0), Heart murmur, Hemorrhage (01-03-14), History of blood transfusion, Hypertension, Hypothyroidism, Immune deficiency disorder ([place]), Nonalcoholic  steatohepatitis ([place]), Peripheral neuropathy, and Post-traumatic stress (01/03/2014).  PAST SURGICAL HISTORY: She  has a past surgical history that includes Tonsillectomy; Flexible sigmoidoscopy (N/A, 12/17/2013); Esophagogastroduodenoscopy (N/A, 02/06/2014); Colonoscopy with propofol (N/A, 01/18/2014); ORIF ankle fracture (Right, 10/07/2015); Cholecystectomy (N/A, 12/25/2016); Upper gastrointestinal endoscopy; Colonoscopy; Polypectomy; Colonoscopy (N/A, 05/01/2017); Colonoscopy with propofol (N/A, 11/11/2017); Fecal transplant (N/A, 11/11/2017); and Fracture surgery.  Allergies  Allergen Reactions  . Iohexol Hives, Itching and Swelling  . Vancomycin   . Morphine And Related Itching and Other (See Comments)    Can tolerate with Benadryl    No current facility-administered medications on file prior to encounter.    Current Outpatient Medications on File Prior to Encounter  Medication Sig  . dicyclomine (BENTYL) 10 MG capsule Take 1 capsule (10 mg total) by mouth 3 (three) times daily before meals. (Patient taking differently: Take 10 mg by mouth every 8 (eight) hours as needed for spasms. )  . esomeprazole (NEXIUM) 20 MG capsule Take 20 mg by mouth daily at 12 noon.  . hydrOXYzine (ATARAX/VISTARIL) 10 MG tablet Take 1 tablet (10 mg total) by mouth 2 (two) times daily.  [place] levothyroxine (SYNTHROID, LEVOTHROID) 75 MCG tablet Take 1 tablet (75 mcg total) by mouth daily before breakfast.  . pregabalin (LYRICA) 50 MG capsule Take 1 capsule (50 mg total) by mouth 3 (three) times daily.  . promethazine (PHENERGAN) 25 MG tablet Take 1 tablet (25 mg total) by mouth every 8 (eight) hours as needed for nausea or vomiting.  . feeding supplement (BOOST / RESOURCE BREEZE) LIQD Take 1 Container by mouth 3 (three) times daily between meals. (Patient not taking: Reported on 11/09/2017)  . gabapentin (  NEURONTIN) 300 MG capsule Take 1 capsule (300 mg total) by mouth 3 (three) times daily. (Patient not taking: Reported on  12/23/2017)  . PEG-KCl-NaCl-NaSulf-Na Asc-C (PLENVU) 140 g SOLR Take 1 kit by mouth as directed. (Patient not taking: Reported on 12/23/2017)  . [DISCONTINUED] Calcium Citrate 200 MG TABS Take 2 tablets (400 mg total) by mouth daily.  . [DISCONTINUED] QUEtiapine (SEROQUEL) 50 MG tablet Take 6 tablets (300 mg total) by mouth at bedtime. For mood control (Patient not taking: Reported on 02/05/2015)    FAMILY HISTORY:  Her indicated that her mother is alive. She indicated that her father is deceased. She indicated that her sister is alive. She indicated that her brother is alive. She indicated that the status of her maternal grandmother is unknown. She indicated that the status of her maternal grandfather is unknown. She indicated that the status of her paternal grandfather is unknown. She indicated that the status of her neg hx is unknown.   SOCIAL HISTORY: She  reports that she has been smoking cigarettes.  She has a 14.00 pack-year smoking history. she has never used smokeless tobacco. She reports that she does not drink alcohol or use drugs.  REVIEW OF SYSTEMS:   As reported above  VITAL SIGNS: BP 107/63   Pulse 66   Temp 99.1 F (37.3 C)   Resp 20   Ht _0  (1.549 m)   Wt 63.9 kg (140 lb 14 oz)   LMP 06/01/2014   SpO2 97%   BMI 26.62 kg/m   HEMODYNAMICS:    VENTILATOR SETTINGS:    INTAKE / OUTPUT: I/O last 3 completed shifts: In: 1933.8 [P.O.:1200; I.V.:733.8] Out: -   PHYSICAL EXAMINATION: General: Obese woman in no distress sitting in bed Neuro: Awake, alert, interacting appropriately, able to answer all questions, follow commands move all extremities. HEENT: She has some upper airway noise and evidence for vocal cord dysfunction Cardiovascular: Heart is regular without a murmur, trace pretibial edema bilaterally Lungs: Clear bilaterally but she does have some referred upper airway noise Abdomen: Obese, somewhat distended, mild generalized tenderness, positive bowel  sounds Musculoskeletal: No deformities Skin: No rash  LABS:  BMET Recent Labs  Lab 12/31/17 0324 01/01/18 0521 01/02/18 0418  NA 135 136 135  K 4.2 4.1 4.0  CL 104 104 106  CO2 22 22 19*  BUN _1 CREATININE 2.21* 2.13* 2.02*  GLUCOSE 87 85 102*    Electrolytes Recent Labs  Lab 12/31/17 0324 01/01/18 0521 01/02/18 0418  CALCIUM 8.5* 9.2 8.7*    CBC Recent Labs  Lab 12/31/17 0324 01/01/18 0521 01/02/18 0418 01/02/18 1541  WBC 3.4* 2.7* 3.0*  --   HGB 7.3* 8.1* 7.0* 8.5*  HCT 22.8* 25.1* 21.1* 25.9*  PLT 101* 107* 99*  --     Coag's No results for input(s): APTT, INR in the last 168 hours.  Sepsis Markers Recent Labs  Lab 01/02/18 1541  LATICACIDVEN 3.4*    ABG No results for input(s): PHART, PCO2ART, PO2ART in the last 168 hours.  Liver Enzymes Recent Labs  Lab 12/31/17 0324 01/01/18 0521 01/02/18 0418  AST 46* 49* 44*  ALT _2 ALKPHOS 104 119 98  BILITOT 4.4* 4.9* 4.7*  ALBUMIN 2.8* 2.9* 2.8*    Cardiac Enzymes No results for input(s): TROPONINI, PROBNP in the last 168 hours.  Glucose Recent Labs  Lab 12/28/17 0803 12/29/17 0740 12/30/17 0759 12/31/17 0752 01/01/18 0809 01/02/18 0815  GLUCAP 93 106* 85  90 121* 122*    Imaging No results found.  Patient had some fever and transient change in mental status, new recurrence of her diarrhea with some blood.  She was seen by Dr. Henrene Pastor and this was felt to probably be due to hemorrhoidal bleeding given her history of recent reassuring colonoscopies.  She did drop her hemoglobin and received a unit of blood with some improvement overall.  She has pyuria on her urinalysis but nitrite is negative, bacteria are negative.  Not really clear to me that she has a urinary tract infection.  I would continue the ceftriaxone until the culture comes back.  I believe she is stable to stay in her current MedSurg bed, I would not change any of her medical plan.  Please call if we can help  further   Baltazar Apo, MD, PhD 01/02/2018, 6:32 PM [facility] [phone] or if no answer [phone]

## 2018-01-02 NOTE — Progress Notes (Addendum)
PROGRESS NOTE    Jamie Allen  CNO:[id number] DOB: [birth year] DOA: 12/23/2017 PCP: Ladell Pier, MD   Brief Narrative by Dr Lonny Prude: "'Jamie Allen is a 47 y.o. femalewith medical history significant ofcirrhosis (unclear if NASH vs Alcoholic), GERD, HTN, Anemia, andrecurrent C diff(s/p offecal transplant),alcohol abuse, tobacco abuse, GERD, hypothyroidism, depression, anxiety, drug-seeking behavior. She presents with fevers/chills/nausea and vomiting concerning for sepsis with shock. Also with significant anemia of unknown cause. S/p 4 units of PRBC to date. No definite source".   Patient discharge cancelled 3-15, due to fever. She complained of dysuria. She also develops bloody stool.   Assessment & Plan:   Principal Problem:   Septic shock ([place]) Active Problems:   Anxiety and depression   Hypothyroidism   GERD (gastroesophageal reflux disease)   Abdominal pain   C. difficile enteritis   Alcoholic cirrhosis of liver (HCC)   Tobacco abuse   Macrocytic anemia   Thrombocytopenia (HCC)   Alcohol abuse  1-Septic Shock;  Received 3 days of Zosyn and 4 days of cefuroxime [name] no significant ascites, pelvic US small amount of physiologic fluid.  Ct abdomen negative for infection.  -Patient spike fever 3-15. Pan-culture again. Follow blood culture, urine culture.  UA with large leukocyte, WBC 6-30.  Chest xray: no active diseases.  -Started IV ceftriaxone.  -Step down level.   Anemia;  B 12, folic acid normal. Cold agglutinin titter negative.  No evidence of active bleeding. Received 4 units of PRBC Appreciate Dr Marin Olp help. Plan for BM biopsy hypercellular.  Acute blood loss anemia today. Report bloody stool. Will transfuse. Start protonix gtt. GI consultation  GI bleed;  Report bloody stool.  IV  Ceftriaxone to cover for SBP.  Blood transfusion.  Cycle hb.  IV protonix.    Alcoholic Cirrhosis;  Last drink a week ago.  LFT stable. , bili decreased.      History of C dif Enteritis;  C diff negative.   Thrombocytopenia Secondary to cirrhosis. Stable.   Vancomycin-induced nephrotoxicity  Vanc trough resulted was 56 (elevated). Vancomycin discontinued. No hydronephrosis on renal ultrasound. Creatinine continues to improve.  Renal function slowly improving. Cr at 2.2.   Peripheral neuropathy. On lyrica, dose reduce due to AKI.   GERD -Continue Protonix  Hypothyroidism -Continue Synthroid  Somnolent; hold atarax. Discontinue morphine.   DVT prophylaxis: scd Code Status: full code.  Family Communication: care discussed with patient.  Disposition Plan: home when hb stable and no further fevers    Consultants:   Hematologist    Procedures:   BM biopsy   Antimicrobials:   Vancomycin (3/6>>3/8)  Zosyn (3/6>>3/9)  Vantin (3/10>>     Subjective: She is sleepy, but wake up answer questions. She is alert, oriented.  Still with mild dysuria. Report 3/4 cup of blood last night around 7 PM.      Objective: Vitals:   01/01/18 2154 01/02/18 0227 01/02/18 0324 01/02/18 0542  BP: (!) 100/34 (!) 121/59  (!) 99/41  Pulse: 79   81  Resp: 18   14  Temp: 97.7 F (36.5 C) (!) 102.5 F (39.2 C) (!) 102.7 F (39.3 C) 98.8 F (37.1 C)  TempSrc: Oral Oral Oral Oral  SpO2: 97%   96%  Weight:      Height:        Intake/Output Summary (Last 24 hours) at 01/02/2018 0815 Last data filed at 01/02/2018 0500 Gross per 24 hour  Intake 1933.75 ml  Output -  Net 1933.75 ml  Filed Weights   12/23/17 2019 12/24/17 1634  Weight: 60.3 kg (133 lb) 63.9 kg (140 lb 14 oz)    Examination:  General exam: NAD Respiratory system; Normal respiratory effort, CTA Cardiovascular system: S 1, S 2 RRR Gastrointestinal system:Distended, Soft, nt Central nervous system: non focal.  Extremities: Symmetric power.  Skin: No rash  Psychiatry: Mood and affect appropriate.    Data Reviewed: I have personally reviewed following labs  and imaging studies  CBC: Recent Labs  Lab 12/29/17 0521  12/29/17 2126 12/30/17 0515 12/31/17 0324 01/01/18 0521 01/02/18 0418  WBC 3.3*  --   --  3.9* 3.4* 2.7* 3.0*  NEUTROABS 2.2  --   --  2.6 1.9 1.8 2.3  HGB 6.9*   < > 8.0* 8.0* 7.3* 8.1* 7.0*  HCT 21.3*   < > 24.8* 25.2* 22.8* 25.1* 21.1*  MCV 107.6*  --   --  98.8 99.1 98.8 100.5*  PLT 90*  --   --  102* 101* 107* 99*   < > = values in this interval not displayed.   Basic Metabolic Panel: Recent Labs  Lab 12/29/17 0521 12/30/17 0515 12/31/17 0324 01/01/18 0521 01/02/18 0418  NA 139 136 135 136 135  K 4.1 4.1 4.2 4.1 4.0  CL 108 105 104 104 106  CO2 18* 21* 22 22 19*  GLUCOSE 92 82 87 85 102*  BUN 15 13 13 9 7   CREATININE 2.52* 2.20* 2.21* 2.13* 2.02*  CALCIUM 8.5* 8.8* 8.5* 9.2 8.7*   GFR: Estimated Creatinine Clearance: 29.5 mL/min (A) (by C-G formula based on SCr of 2.02 mg/dL (H)). Liver Function Tests: Recent Labs  Lab 12/29/17 0521 12/30/17 0515 12/31/17 0324 01/01/18 0521 01/02/18 0418  AST 43* 50* 46* 49* 44*  ALT 17 18 17 16 16   ALKPHOS 112 120 104 119 98  BILITOT 4.0* 5.0* 4.4* 4.9* 4.7*  PROT 7.1 7.5 6.8 7.2 6.7  ALBUMIN 2.7* 3.0* 2.8* 2.9* 2.8*   No results for input(s): LIPASE, AMYLASE in the last 168 hours. No results for input(s): AMMONIA in the last 168 hours. Coagulation Profile: No results for input(s): INR, PROTIME in the last 168 hours. Cardiac Enzymes: No results for input(s): CKTOTAL, CKMB, CKMBINDEX, TROPONINI in the last 168 hours. BNP (last 3 results) No results for input(s): PROBNP in the last 8760 hours. HbA1C: No results for input(s): HGBA1C in the last 72 hours. CBG: Recent Labs  Lab 12/28/17 0803 12/29/17 0740 12/30/17 0759 12/31/17 0752 01/01/18 0809  GLUCAP 93 106* 85 90 121*   Lipid Profile: No results for input(s): CHOL, HDL, LDLCALC, TRIG, CHOLHDL, LDLDIRECT in the last 72 hours. Thyroid Function Tests: No results for input(s): TSH, T4TOTAL, FREET4,  T3FREE, THYROIDAB in the last 72 hours. Anemia Panel: No results for input(s): VITAMINB12, FOLATE, FERRITIN, TIBC, IRON, RETICCTPCT in the last 72 hours. Sepsis Labs: No results for input(s): PROCALCITON, LATICACIDVEN in the last 168 hours.  Recent Results (from the past 240 hour(s))  Blood culture (routine x 2)     Status: None   Collection Time: 12/23/17  7:50 PM  Result Value Ref Range Status   Specimen Description BLOOD RIGHT HAND  Final   Special Requests   Final    IN PEDIATRIC BOTTLE Blood Culture results may not be optimal due to an excessive volume of blood received in culture bottles   Culture   Final    NO GROWTH 5 DAYS Performed at [facility], [place] [address],  [place], [place] [address]    Report Status 12/28/2017 FINAL  Final  Blood culture (routine x 2)     Status: None   Collection Time: 12/23/17  7:50 PM  Result Value Ref Range Status   Specimen Description BLOOD LEFT ANTECUBITAL  Final   Special Requests   Final    BOTTLES DRAWN AEROBIC AND ANAEROBIC Blood Culture adequate volume   Culture   Final    NO GROWTH 5 DAYS Performed at [facility], [place] [address] [place], [place] [address]    Report Status 12/28/2017 FINAL  Final  Stool culture (children & immunocomp patients)     Status: None   Collection Time: 12/24/17  2:53 AM  Result Value Ref Range Status   Salmonella/Shigella Screen Final report  Final   Campylobacter Culture Final report  Final   E coli, Shiga toxin Assay Negative Negative Final    Comment: (NOTE) Performed At: [facility] [address] [place], [place] [id number] Rush Farmer MD [id number]:[id number] Performed at [facility], [place] [address], [place], [place] [address]   C difficile quick scan w PCR reflex     Status: None   Collection Time: 12/24/17  2:53 AM  Result Value Ref Range Status   C Diff antigen NEGATIVE NEGATIVE Final   C Diff toxin NEGATIVE NEGATIVE Final   C Diff interpretation No C. difficile  detected.  Final    Comment: Performed at [facility], [place] [address] [place], [place] [address]  STOOL CULTURE REFLEX - RSASHR     Status: None   Collection Time: 12/24/17  2:53 AM  Result Value Ref Range Status   Stool Culture result 1 (RSASHR) Comment  Final    Comment: (NOTE) No Salmonella or Shigella recovered. Performed At: [facility] [place], [place] [id number] Rush Farmer MD [id number] Performed at [facility], [place] [address] [place], [place] [address]   STOOL CULTURE Reflex - CMPCXR     Status: None   Collection Time: 12/24/17  2:53 AM  Result Value Ref Range Status   Stool Culture result 1 (CMPCXR) Comment  Final    Comment: (NOTE) No Campylobacter species isolated. Performed At: [facility] [place], [place] [id number] Rush Farmer MD [id number]:[id number] Performed at [facility], [place] [address] [place], [place] [address]   Urine culture     Status: None   Collection Time: 12/24/17  3:30 AM  Result Value Ref Range Status   Specimen Description URINE, RANDOM  Final   Special Requests NONE  Final   Culture   Final    NO GROWTH Performed at [facility], [place] [address] [place], [place] [address]    Report Status 12/25/2017 FINAL  Final  Respiratory Panel by PCR     Status: None   Collection Time: 12/24/17  6:20 AM  Result Value Ref Range Status   Adenovirus NOT DETECTED NOT DETECTED Final   Coronavirus 229E NOT DETECTED NOT DETECTED Final   Coronavirus HKU1 NOT DETECTED NOT DETECTED Final   Coronavirus NL63 NOT DETECTED NOT DETECTED Final   Coronavirus OC43 NOT DETECTED NOT DETECTED Final   Metapneumovirus NOT DETECTED NOT DETECTED Final   Rhinovirus / Enterovirus NOT DETECTED NOT DETECTED Final   Influenza A NOT DETECTED NOT DETECTED Final   Influenza B NOT DETECTED NOT DETECTED Final   Parainfluenza Virus 1 NOT DETECTED NOT DETECTED Final   Parainfluenza Virus  2 NOT DETECTED NOT  DETECTED Final   Parainfluenza Virus 3 NOT DETECTED NOT DETECTED Final   Parainfluenza Virus 4 NOT DETECTED NOT DETECTED Final   Respiratory Syncytial Virus NOT DETECTED NOT DETECTED Final   Bordetella pertussis NOT DETECTED NOT DETECTED Final   Chlamydophila pneumoniae NOT DETECTED NOT DETECTED Final   Mycoplasma pneumoniae NOT DETECTED NOT DETECTED Final  MRSA PCR Screening     Status: None   Collection Time: 12/25/17  5:12 AM  Result Value Ref Range Status   MRSA by PCR NEGATIVE NEGATIVE Final    Comment:        The GeneXpert MRSA Assay (FDA approved for NASAL specimens only), is one component of a comprehensive MRSA colonization surveillance program. It is not intended to diagnose MRSA infection nor to guide or monitor treatment for MRSA infections. Performed at [facility], [place] [address], [place],  [address]          Radiology Studies: Dg Chest Port 1 View  Result Date: 01/01/2018 CLINICAL DATA:  Fever today with dyspnea EXAM: PORTABLE CHEST 1 VIEW COMPARISON:  12/23/2017 FINDINGS: The heart size and mediastinal contours are within normal limits. Atelectasis and/or scarring is seen at the left lung base. No pulmonary consolidation nor overt pulmonary edema. The visualized skeletal structures are unremarkable. IMPRESSION: No active disease. Electronically Signed   By: Ashley Royalty M.D.   On: 01/01/2018 19:16        Scheduled Meds: . colestipol  1 g Oral BID  . folic acid  1 mg Oral Daily  . hydrOXYzine  10 mg Oral BID  . levothyroxine  75 mcg Oral QAC breakfast  . lipase/protease/amylase  72,000 Units Oral TID WC  . multivitamin with minerals  1 tablet Oral Daily  . nicotine  21 mg Transdermal Daily  . pantoprazole  40 mg Oral Q0600  . pregabalin  50 mg Oral BID  . thiamine  100 mg Oral Daily   Continuous Infusions: . sodium chloride    . sodium chloride 75 mL/hr at 01/01/18 1913  . sodium chloride    . cefTRIAXone (ROCEPHIN)  IV       LOS:  9 days    Time spent: 35 minutes.     Elmarie Shiley, MD [facility] Pager [phone]  If 7PM-7AM, please contact night-coverage [email or web address] Password [facility] 01/02/2018, 8:15 AM

## 2018-01-02 NOTE — Significant Event (Addendum)
Rapid Response Event Note  Overview: Second Set of Eyes/Fever/AMS/Bleeding/Low BP   Initial Focused Assessment: Per RN, patient has re-spiked fevers, has had bright red blood from the rectum (restarted), + chills, and is confused. Per RN, at the start of the shift, patient was afebrile and was not disoriented but over the course of the night has became confused and febrile.  Per RN, SBP are soft in the 90s and MAPs in the 50s.  When I arrived, patient was resting, visible chills/rigor, I woke the patient, A/O x 3, but disoriented to situation.  Lung sounds were clear, not in acute respiratory distress, mild abdominal distension, skin very hot to touch, + pulses. RN had administer 1L NS bolus overnight. Despite cooling measures and multiple APAP dose, temp is 102.7 orally, will avoid rectal temp due to bleeding from rectum.   Interventions: -- STAT labs (CMP, CBC, LDH) -- Upgrade level of care to SDU and place patient on continuous monitoring  Plan of Care (if not transferred): -- Depending CBC results, patient will either need IVF or blood administration, will follow lab results -- 630 - orders for 1 unit PRBC  Event Summary:    at    Start Time 0400 End Time [place], [place]

## 2018-01-02 NOTE — Progress Notes (Addendum)
Patient ID: Jamie Allen, female   DOB: [birth year], 47 y.o.   MRN: [id number]    Progress Note   Subjective   Asked to reevaluate patient today regarding rectal bleeding. Patient had an episode in the early morning hours with temperature spikes 102.7, with associated rigors, and drop in blood pressure. She also reported one episode of bright red blood per rectum, she states this occurred while she was urinating and had bright red blood in the commode without stool. This is only occurred one time today. She has had 2 other bowel movements today which have been loose, as usual without gross blood. Patient says she hasn't had much appetite, she continues to have generalized abdominal discomfort but states this is the same discomfort she's been having over the past 3 months.  Positive dysuria  UA positive today Chest x-ray negative Hemoglobin 7 hematocrit of 21.1 early a.m., posttransfusion hemoglobin 8.5   Objective   Vital signs in last 24 hours: Temp:  [97.7 F (36.5 C)-102.7 F (39.3 C)] 99.1 F (37.3 C) (03/16 1458) Pulse Rate:  [66-81] 66 (03/16 1304) Resp:  [14-21] 20 (03/16 1304) BP: (86-121)/(34-63) 107/63 (03/16 1304) SpO2:  [96 %-99 %] 97 % (03/16 1304) Last BM Date: 01/01/18 General:    white female in NAD,having chills Heart:  Regular rate and rhythm; no murmurs Lungs: Respirations even and unlabored, lungs CTA bilaterally Abdomen:  Soft,L sounds are present she has mild diffuse tenderness, nonfocal no guarding or rebound, no appreciable fluid wave Extremities:  Without edema. Neurologic:  Alert and oriented,  grossly normal neurologically. Psych:  Cooperative. Normal mood and affect.  Intake/Output from previous day: 03/15 0701 - 03/16 0700 In: 1933.8 [P.O.:1200; I.V.:733.8] Out: -  Intake/Output this shift: No intake/output data recorded.  Lab Results: Recent Labs    12/31/17 0324 01/01/18 0521 01/02/18 0418 01/02/18 1541  WBC 3.4* 2.7* 3.0*  --     HGB 7.3* 8.1* 7.0* 8.5*  HCT 22.8* 25.1* 21.1* 25.9*  PLT 101* 107* 99*  --    BMET Recent Labs    12/31/17 0324 01/01/18 0521 01/02/18 0418  NA 135 136 135  K 4.2 4.1 4.0  CL 104 104 106  CO2 22 22 19*  GLUCOSE 87 85 102*  BUN 13 9 7   CREATININE 2.21* 2.13* 2.02*  CALCIUM 8.5* 9.2 8.7*   LFT Recent Labs    01/02/18 0418  PROT 6.7  ALBUMIN 2.8*  AST 44*  ALT 16  ALKPHOS 98  BILITOT 4.7*   PT/INR No results for input(s): LABPROT, INR in the last 72 hours.  Studies/Results: Dg Chest Port 1 View  Result Date: 01/01/2018 CLINICAL DATA:  Fever today with dyspnea EXAM: PORTABLE CHEST 1 VIEW COMPARISON:  12/23/2017 FINDINGS: The heart size and mediastinal contours are within normal limits. Atelectasis and/or scarring is seen at the left lung base. No pulmonary consolidation nor overt pulmonary edema. The visualized skeletal structures are unremarkable. IMPRESSION: No active disease. Electronically Signed   By: Ashley Royalty M.D.   On: 01/01/2018 19:16       Assessment / Plan:    #65 47 year old white female with decompensated cirrhosis secondary to [employer] with recent MELD 21who was actively drinking until this admission. Admitted with SIRS, of unclear etiology. She did not have any ascites on admission, therefore no SBP, C. 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 Patient had had some persistent mild or  diarrhea and is currently on Creon and Colestid with improvement #3 multifactorial anemia-status post bone marrow  bx-Hematology following  #4 new fever today with dysuria-UA positive, pan cultured and now on ceftriaxone #5 daily hematochezia 1 today-this is very likely secondary to internal hemorrhoids. Patient is not having an active GI bleed. She had colonoscopy done on 11/11/2017 which was entirely normal other than internal hemorrhoids.  Plan; Serial hemoglobins and  transfuse as indicated again anemia is multifactorial Will start Anusol HC suppositories twice a day . No indication for endoscopic intervention at this time   Contact  Amy Selah, P.A.-C               [phone]  Principal Problem:   Septic shock ([place]) Active Problems:   Anxiety and depression   Hypothyroidism   GERD (gastroesophageal reflux disease)   Abdominal pain   C. difficile enteritis   Alcoholic cirrhosis of liver (HCC)   Tobacco abuse   Macrocytic anemia   Thrombocytopenia ([place])   Alcohol abuse   LOS: 9 days   Amy Esterwood  01/02/2018, 4:11 PM   GI ATTENDING  Called back to see patient regarding rectal bleeding. Interval history data reviewed. Agree with comprehensive interval progress note as outlined above. In terms of rectal bleeding,which is minor, almost certainly due to known hemorrhoids. Has had 2 normal colonoscopies in less than one year, most recently January. Next, nonspecific abdominal complaints and discomfort is certainly related to urinary tract infection. Aside from medicated suppositories (not necessary unless bleeding is significant) no additional recommendations. Continue treatment of general medical problems per primary service. No additional recommendations regarding chronic liver disease(aside from avoiding all alcohol). We're available if needed. Will sign off.   Docia Chuck. Geri Seminole., M.D. [facility] Division of Gastroenterology

## 2018-01-02 NOTE — Progress Notes (Addendum)
Pt with fever of 102.5 and shaking chills. Administered PRN dose of tylenol. When helping pt to bedside commode, noticed bright red blood in toilet and on toilet paper. Pt states she thought that has been going on for a day and that it could be coming from rectum. Noticed new bright red blood drips on toilet in restroom too. Pt also began exhibiting new confusion after returning to bed, unable to recall where she was and asking inappropriate questions for situation. Notified Bodenheimer, NP. Received order for additional 325 mg of tylenol. Will continue to monitor.

## 2018-01-03 ENCOUNTER — Inpatient Hospital Stay (HOSPITAL_COMMUNITY): Payer: Medicaid Other

## 2018-01-03 LAB — COMPREHENSIVE METABOLIC PANEL
ALBUMIN: 2.7 g/dL — AB (ref 3.5–5.0)
ALK PHOS: 95 U/L (ref 38–126)
ALT: 15 U/L (ref 14–54)
AST: 40 U/L (ref 15–41)
Anion gap: 9 (ref 5–15)
BILIRUBIN TOTAL: 4.6 mg/dL — AB (ref 0.3–1.2)
BUN: 10 mg/dL (ref 6–20)
CALCIUM: 8.7 mg/dL — AB (ref 8.9–10.3)
CO2: 18 mmol/L — ABNORMAL LOW (ref 22–32)
Chloride: 112 mmol/L — ABNORMAL HIGH (ref 101–111)
Creatinine, Ser: 1.92 mg/dL — ABNORMAL HIGH (ref 0.44–1.00)
GFR calc Af Amer: 35 mL/min — ABNORMAL LOW (ref >60)
GFR calc non Af Amer: 30 mL/min — ABNORMAL LOW (ref >60)
Glucose, Bld: 101 mg/dL — ABNORMAL HIGH (ref 65–99)
Potassium: 3.9 mmol/L (ref 3.5–5.1)
Sodium: 139 mmol/L (ref 135–145)
TOTAL PROTEIN: 7.3 g/dL (ref 6.5–8.1)

## 2018-01-03 LAB — URINE CULTURE: Culture: <10000 — AB

## 2018-01-03 LAB — AMMONIA: Ammonia: 50 umol/L — ABNORMAL HIGH (ref 9–35)

## 2018-01-03 LAB — GLUCOSE, CAPILLARY: Glucose-Capillary: 90 mg/dL (ref 65–99)

## 2018-01-03 LAB — HEMOGLOBIN AND HEMATOCRIT, BLOOD
HCT: 25.2 % — ABNORMAL LOW (ref 36.0–46.0)
Hemoglobin: 8.1 g/dL — ABNORMAL LOW (ref 12.0–15.0)

## 2018-01-03 MED ORDER — LACTULOSE 10 GM/15ML PO SOLN: 30.0000 g | Freq: Two times a day (BID) | ORAL | Status: DC

## 2018-01-03 MED ORDER — LACTULOSE 10 GM/15ML PO SOLN
20.0000 g | Freq: Every day | ORAL | Status: DC
  Administered 2018-01-03: 20 g via ORAL
  Filled 2018-01-03: qty 30

## 2018-01-03 MED ORDER — LACTULOSE 10 GM/15ML PO SOLN
20.0000 g | Freq: Three times a day (TID) | ORAL | Status: DC
  Administered 2018-01-03 (×2): 20 g via ORAL
  Filled 2018-01-03 (×3): qty 30

## 2018-01-03 NOTE — Progress Notes (Signed)
PROGRESS NOTE    Jamie Allen  QXI:[id number] DOB: 03/11/1971 DOA: 12/23/2017 PCP: Ladell Pier, MD   Brief Narrative by Dr Lonny Prude: "'Jamie Allen is a 47 y.o. femalewith medical history significant ofcirrhosis (unclear if NASH vs Alcoholic), GERD, HTN, Anemia, andrecurrent C diff(s/p offecal transplant),alcohol abuse, tobacco abuse, GERD, hypothyroidism, depression, anxiety, drug-seeking behavior. She presents with fevers/chills/nausea and vomiting concerning for sepsis with shock. Also with significant anemia of unknown cause. S/p 4 units of PRBC to date. No definite source".   Patient discharge cancelled 3-15, due to fever. She complained of dysuria. She also develops bloody stool.   Assessment & Plan:   Principal Problem:   Septic shock ([place]) Active Problems:   Anxiety and depression   Hypothyroidism   GERD (gastroesophageal reflux disease)   Abdominal pain   C. difficile enteritis   Alcoholic cirrhosis of liver (HCC)   Tobacco abuse   Macrocytic anemia   Thrombocytopenia (HCC)   Alcohol abuse   Internal bleeding hemorrhoids  1-Septic Shock;  Received 3 days of Zosyn and 4 days of cefuroxime [name] no significant ascites, pelvic US small amount of physiologic fluid.  Ct abdomen negative for infection.  -Patient spike fever 3-15. Pan-culture again. Follow blood culture no growth  , urine culture 10,000 colonies. Marland Kitchen  UA with large leukocyte, WBC 6-30.  Chest xray: no active diseases.  -Started IV ceftriaxone 3-16 -Step down level.  Will repeat US to evaluate for ascites.   Anemia;  B 12, folic acid normal. Cold agglutinin titter negative.  No evidence of active bleeding. Received 4 units of PRBC Appreciate Dr Marin Olp help. Plan for BM biopsy hypercellular.  Acute blood loss anemia 3-16. Report bloody stool. Transfuse one unit 3-16./ Evaluated by GI, they think is hemorrhoids.  Hb stable today.   GI bleed;  Report bloody stool.  IV  Ceftriaxone to  cover for SBP.  Hb stable. No further bleed today/  IV protonix.    Alcoholic Cirrhosis;  Last drink a week ago.  LFT stable. , bili decreased.    History of C dif Enteritis;  C diff negative.   Thrombocytopenia Secondary to cirrhosis. Stable.   Vancomycin-induced nephrotoxicity  Vanc trough resulted was 56 (elevated). Vancomycin discontinued. No hydronephrosis on renal ultrasound. Creatinine continues to improve.  Renal function slowly improving. Cr at 2.2.   Peripheral neuropathy. On lyrica, dose reduce due to AKI.   GERD -Continue Protonix  Hypothyroidism -Continue Synthroid  Encephalopathy. Confuse. Somnolent; hold atarax. Discontinue morphine.  Hepatic encephalopathy vs infection.  Started lactulose.  Treating for infection.     DVT prophylaxis: scd Code Status: full code.  Family Communication: care discussed with patient.  Disposition Plan: home when hb stable and no further fevers    Consultants:   Hematologist    Procedures:   BM biopsy   Antimicrobials:   Vancomycin (3/6>>3/8)  Zosyn (3/6>>3/9)  Vantin (3/10>>     Subjective: She is more alert today, appears less confuse, but still having some confusion.  She report improvement of dysuria. No BM/ no blood stool.    Objective: Vitals:   01/02/18 2103 01/02/18 2333 01/03/18 0405 01/03/18 0505  BP:  113/73 127/77   Pulse:  75 75   Resp:  14 20   Temp: 98.6 F (37 C) 97.6 F (36.4 C) 97.6 F (36.4 C) 98.2 F (36.8 C)  TempSrc: Oral Axillary Oral Oral  SpO2:  95% 97%   Weight:      Height:  Intake/Output Summary (Last 24 hours) at 01/03/2018 1427 Last data filed at 01/03/2018 0507 Gross per 24 hour  Intake 1908.75 ml  Output -  Net 1908.75 ml   Filed Weights   12/23/17 2019 12/24/17 1634  Weight: 60.3 kg (133 lb) 63.9 kg (140 lb 14 oz)    Examination:  General exam: NAD Respiratory system;  Normal respiratory effort, crackles bases.  Cardiovascular system: S  1, S 2 RRR Gastrointestinal system: BS present, soft , distended.  Central nervous system: alert  Extremities: Symmetric power.  Skin: No rash  Psychiatry: confuse   Data Reviewed: I have personally reviewed following labs and imaging studies  CBC: Recent Labs  Lab 12/29/17 0521  12/30/17 0515 12/31/17 0324 01/01/18 0521 01/02/18 0418 01/02/18 1541 01/02/18 2123 01/03/18 0548  WBC 3.3*  --  3.9* 3.4* 2.7* 3.0*  --   --   --   NEUTROABS 2.2  --  2.6 1.9 1.8 2.3  --   --   --   HGB 6.9*   < > 8.0* 7.3* 8.1* 7.0* 8.5* 7.6* 8.1*  HCT 21.3*   < > 25.2* 22.8* 25.1* 21.1* 25.9* 23.3* 25.2*  MCV 107.6*  --  98.8 99.1 98.8 100.5*  --   --   --   PLT 90*  --  102* 101* 107* 99*  --   --   --    < > = values in this interval not displayed.   Basic Metabolic Panel: Recent Labs  Lab 12/30/17 0515 12/31/17 0324 01/01/18 0521 01/02/18 0418 01/03/18 0548  NA 136 135 136 135 139  K 4.1 4.2 4.1 4.0 3.9  CL 105 104 104 106 112*  CO2 21* 22 22 19* 18*  GLUCOSE 82 87 85 102* 101*  BUN 13 13 9 7 10   CREATININE 2.20* 2.21* 2.13* 2.02* 1.92*  CALCIUM 8.8* 8.5* 9.2 8.7* 8.7*   GFR: Estimated Creatinine Clearance: 31 mL/min (A) (by C-G formula based on SCr of 1.92 mg/dL (H)). Liver Function Tests: Recent Labs  Lab 12/30/17 0515 12/31/17 0324 01/01/18 0521 01/02/18 0418 01/03/18 0548  AST 50* 46* 49* 44* 40  ALT 18 17 16 16 15   ALKPHOS 120 104 119 98 95  BILITOT 5.0* 4.4* 4.9* 4.7* 4.6*  PROT 7.5 6.8 7.2 6.7 7.3  ALBUMIN 3.0* 2.8* 2.9* 2.8* 2.7*   No results for input(s): LIPASE, AMYLASE in the last 168 hours. Recent Labs  Lab 01/03/18 0548  AMMONIA 50*   Coagulation Profile: No results for input(s): INR, PROTIME in the last 168 hours. Cardiac Enzymes: No results for input(s): CKTOTAL, CKMB, CKMBINDEX, TROPONINI in the last 168 hours. BNP (last 3 results) No results for input(s): PROBNP in the last 8760 hours. HbA1C: No results for input(s): HGBA1C in the last 72  hours. CBG: Recent Labs  Lab 12/30/17 0759 12/31/17 0752 01/01/18 0809 01/02/18 0815 01/03/18 0749  GLUCAP 85 90 121* 122* 90   Lipid Profile: No results for input(s): CHOL, HDL, LDLCALC, TRIG, CHOLHDL, LDLDIRECT in the last 72 hours. Thyroid Function Tests: No results for input(s): TSH, T4TOTAL, FREET4, T3FREE, THYROIDAB in the last 72 hours. Anemia Panel: No results for input(s): VITAMINB12, FOLATE, FERRITIN, TIBC, IRON, RETICCTPCT in the last 72 hours. Sepsis Labs: Recent Labs  Lab 01/02/18 1541 01/02/18 1920  LATICACIDVEN 3.4* 1.5    Recent Results (from the past 240 hour(s))  MRSA PCR Screening     Status: None   Collection Time: 12/25/17  5:12 AM  Result  Value Ref Range Status   MRSA by PCR NEGATIVE NEGATIVE Final    Comment:        The GeneXpert MRSA Assay (FDA approved for NASAL specimens only), is one component of a comprehensive MRSA colonization surveillance program. It is not intended to diagnose MRSA infection nor to guide or monitor treatment for MRSA infections. Performed at [facility], [place], [place] [address], [place], [place] [address]   Culture, blood (routine x 2)     Status: None (Preliminary result)   Collection Time: 01/01/18  4:32 PM  Result Value Ref Range Status   Specimen Description BLOOD RIGHT FOREARM  Final   Special Requests   Final    BOTTLES DRAWN AEROBIC AND ANAEROBIC Blood Culture adequate volume   Culture   Final    NO GROWTH 2 DAYS Performed at [facility], [place] [address], [place], [place] [address]    Report Status PENDING  Incomplete  Culture, blood (routine x 2)     Status: None (Preliminary result)   Collection Time: 01/01/18  4:43 PM  Result Value Ref Range Status   Specimen Description BLOOD LEFT WRIST  Final   Special Requests   Final    BOTTLES DRAWN AEROBIC AND ANAEROBIC Blood Culture adequate volume   Culture   Final    NO GROWTH 2 DAYS Performed at [facility], [place] [address], [place], [place]  [address]    Report Status PENDING  Incomplete  Urine Culture     Status: Abnormal   Collection Time: 01/01/18 10:32 PM  Result Value Ref Range Status   Specimen Description URINE, RANDOM  Final   Special Requests NONE  Final   Culture (A)  Final    <10,000 COLONIES/mL INSIGNIFICANT GROWTH Performed at [facility], [place] [address], [place], [place] [address]    Report Status 01/03/2018 FINAL  Final         Radiology Studies: Dg Chest Port 1 View  Result Date: 01/01/2018 CLINICAL DATA:  Fever today with dyspnea EXAM: PORTABLE CHEST 1 VIEW COMPARISON:  12/23/2017 FINDINGS: The heart size and mediastinal contours are within normal limits. Atelectasis and/or scarring is seen at the left lung base. No pulmonary consolidation nor overt pulmonary edema. The visualized skeletal structures are unremarkable. IMPRESSION: No active disease. Electronically Signed   By: Ashley Royalty M.D.   On: 01/01/2018 19:16        Scheduled Meds: . colestipol  1 g Oral BID  . folic acid  1 mg Oral Daily  . hydrocortisone  25 mg Rectal BID  . lactulose  20 g Oral TID  . levothyroxine  75 mcg Oral QAC breakfast  . lipase/protease/amylase  72,000 Units Oral TID WC  . multivitamin with minerals  1 tablet Oral Daily  . nicotine  21 mg Transdermal Daily  . pantoprazole (PROTONIX) IV  40 mg Intravenous Q12H  . pregabalin  50 mg Oral BID  . thiamine  100 mg Oral Daily   Continuous Infusions: . sodium chloride    . sodium chloride 75 mL/hr at 01/02/18 2331  . cefTRIAXone (ROCEPHIN)  IV Stopped (01/03/18 1045)     LOS: 10 days    Time spent: 35 minutes.     Elmarie Shiley, MD [facility] Pager [phone]  If 7PM-7AM, please contact night-coverage [email or web address] Password [facility] 01/03/2018, 2:27 PM

## 2018-01-03 NOTE — Plan of Care (Signed)
  Progressing Education: Knowledge of General Education information will improve 01/03/2018 0607 - Progressing by Verne Grain, RN Note Educated patient on calling team when s/s of fever occur such as chills body ache and change in mental status or feeling confused

## 2018-01-04 LAB — CBC
HCT: 23.2 % — ABNORMAL LOW (ref 36.0–46.0)
HEMOGLOBIN: 7.5 g/dL — AB (ref 12.0–15.0)
MCH: 31.6 pg (ref 26.0–34.0)
MCHC: 32.3 g/dL (ref 30.0–36.0)
MCV: 97.9 fL (ref 78.0–100.0)
Platelets: 118 10*3/uL — ABNORMAL LOW (ref 150–400)
RBC: 2.8 MIL/uL — ABNORMAL LOW (ref 3.87–5.11)
WBC: 3.8 10*3/uL — ABNORMAL LOW (ref 4.0–10.5)

## 2018-01-04 LAB — BASIC METABOLIC PANEL
ANION GAP: 10 (ref 5–15)
BUN: 5 mg/dL — ABNORMAL LOW (ref 6–20)
CALCIUM: 8.6 mg/dL — AB (ref 8.9–10.3)
CO2: 15 mmol/L — ABNORMAL LOW (ref 22–32)
Chloride: 114 mmol/L — ABNORMAL HIGH (ref 101–111)
Creatinine, Ser: 1.76 mg/dL — ABNORMAL HIGH (ref 0.44–1.00)
GFR, EST AFRICAN AMERICAN: 39 mL/min — AB (ref >60)
GFR, EST NON AFRICAN AMERICAN: 33 mL/min — AB (ref >60)
Glucose, Bld: 90 mg/dL (ref 65–99)
Potassium: 3.5 mmol/L (ref 3.5–5.1)
SODIUM: 139 mmol/L (ref 135–145)

## 2018-01-04 LAB — HEMOGLOBIN AND HEMATOCRIT, BLOOD
HEMATOCRIT: 23.2 % — AB (ref 36.0–46.0)
HEMOGLOBIN: 7.3 g/dL — AB (ref 12.0–15.0)

## 2018-01-04 LAB — GLUCOSE, CAPILLARY: GLUCOSE-CAPILLARY: 106 mg/dL — AB (ref 65–99)

## 2018-01-04 MED ORDER — SODIUM BICARBONATE 8.4 % IV SOLN
INTRAVENOUS | Status: DC
  Administered 2018-01-04 – 2018-01-05 (×2): via INTRAVENOUS
  Filled 2018-01-04 (×3): qty 150

## 2018-01-04 NOTE — Progress Notes (Signed)
PROGRESS NOTE    Jamie Allen  VPX:[id number] DOB: 06-23-1971 DOA: 12/23/2017 PCP: Ladell Pier, MD   Brief Narrative by Dr Lonny Prude: "'Jamie Allen is a 47 y.o. femalewith medical history significant ofcirrhosis (unclear if NASH vs Alcoholic), GERD, HTN, Anemia, andrecurrent C diff(s/p offecal transplant),alcohol abuse, tobacco abuse, GERD, hypothyroidism, depression, anxiety, drug-seeking behavior. She presents with fevers/chills/nausea and vomiting concerning for sepsis with shock. Also with significant anemia of unknown cause. S/p 4 units of PRBC to date. No definite source".   Patient discharge cancelled 3-15, due to fever. She complained of dysuria. She also develops bloody stool.   Assessment & Plan:   Principal Problem:   Septic shock ([place]) Active Problems:   Anxiety and depression   Hypothyroidism   GERD (gastroesophageal reflux disease)   Abdominal pain   C. difficile enteritis   Alcoholic cirrhosis of liver (HCC)   Tobacco abuse   Macrocytic anemia   Thrombocytopenia (HCC)   Alcohol abuse   Internal bleeding hemorrhoids  1-Septic Shock;  Received 3 days of Zosyn and 4 days of cefuroxime [name] no significant ascites, pelvic US small amount of physiologic fluid.  Ct abdomen negative for infection.  -Patient spike fever 3-15. Pan-culture again. Follow blood culture no growth  , urine culture 10,000 colonies. Marland Kitchen  UA with large leukocyte, WBC 6-30.  Chest xray: no active diseases. Repeated US no significant ascites.  -Started IV ceftriaxone 3-16 -Step down level.  -afebrile today.   Anemia;  B 12, folic acid normal. Cold agglutinin titter negative.  No evidence of active bleeding. Received 4 units of PRBC Appreciate Dr Marin Olp help. Plan for BM biopsy hypercellular.  Acute blood loss anemia 3-16. Report bloody stool. Transfuse one unit 3-16./ Evaluated by GI, they think is hemorrhoids.  Hb today at 7.6. She denies significant bleeding. Repeat hb  tonight, if further drop might need transfusion.   GI bleed; no further bleeding.  IV  Ceftriaxone to cover for SBP.  Hb stable. No further bleed today/  IV protonix.   Metabolic acidosis; suspect related to diarrhea.  Started bicarb gtt   Alcoholic Cirrhosis;  Last drink a week ago.  LFT stable. , bili decreased.  Dr Marin Olp evaluating patient for wilson diseases.   History of C dif Enteritis;  C diff negative.   Thrombocytopenia Secondary to cirrhosis. Stable.   Vancomycin-induced nephrotoxicity  Vanc trough resulted was 56 (elevated). Vancomycin discontinued. No hydronephrosis on renal ultrasound. Creatinine continues to improve.  Renal function slowly improving. Cr at 2.2.   Peripheral neuropathy. On lyrica, dose reduce due to AKI.   GERD -Continue Protonix  Hypothyroidism -Continue Synthroid  Encephalopathy. Confuse. Somnolent; hold atarax. Discontinue morphine.  Hepatic encephalopathy vs infection.  Started lactulose. Hold lactulose today due to multiples BM  Treating for infection.     DVT prophylaxis: scd Code Status: full code.  Family Communication: care discussed with patient.  Disposition Plan: home when hb stable and no further fevers    Consultants:   Hematologist    Procedures:   BM biopsy   Antimicrobials:   Vancomycin (3/6>>3/8)  Zosyn (3/6>>3/9)  Vantin (3/10>>     Subjective: She is more alert today, appears less confuse, but still having some confusion.  She report improvement of dysuria. No BM/ no blood stool.    Objective: Vitals:   01/04/18 0841 01/04/18 0900 01/04/18 0929 01/04/18 1247  BP: (!) 112/49  115/71 116/86  Pulse: 81  77 90  Resp: 16  14 20  Temp: 98.7 F (37.1 C) 98.9 F (37.2 C)  98.7 F (37.1 C)  TempSrc: Oral Oral  Oral  SpO2: 97%  94% 100%  Weight:      Height:        Intake/Output Summary (Last 24 hours) at 01/04/2018 1440 Last data filed at 01/04/2018 9326 Gross per 24 hour  Intake  2176.25 ml  Output -  Net 2176.25 ml   Filed Weights   12/23/17 2019 12/24/17 1634  Weight: 60.3 kg (133 lb) 63.9 kg (140 lb 14 oz)    Examination:  General exam: NAD Respiratory system; CTA Cardiovascular system: S 1, S 2 RRR Gastrointestinal system: BS present, soft, nt Central nervous system: alert, follow comamnd Extremities: symmetric power.  Skin: No rash  Psychiatry: confuse   Data Reviewed: I have personally reviewed following labs and imaging studies  CBC: Recent Labs  Lab 12/29/17 0521  12/30/17 0515 12/31/17 0324 01/01/18 0521 01/02/18 0418 01/02/18 1541 01/02/18 2123 01/03/18 0548 01/04/18 0644  WBC 3.3*  --  3.9* 3.4* 2.7* 3.0*  --   --   --  3.8*  NEUTROABS 2.2  --  2.6 1.9 1.8 2.3  --   --   --   --   HGB 6.9*   < > 8.0* 7.3* 8.1* 7.0* 8.5* 7.6* 8.1* 7.5*  HCT 21.3*   < > 25.2* 22.8* 25.1* 21.1* 25.9* 23.3* 25.2* 23.2*  MCV 107.6*  --  98.8 99.1 98.8 100.5*  --   --   --  97.9  PLT 90*  --  102* 101* 107* 99*  --   --   --  118*   < > = values in this interval not displayed.   Basic Metabolic Panel: Recent Labs  Lab 12/31/17 0324 01/01/18 0521 01/02/18 0418 01/03/18 0548 01/04/18 0644  NA 135 136 135 139 139  K 4.2 4.1 4.0 3.9 3.5  CL 104 104 106 112* 114*  CO2 22 22 19* 18* 15*  GLUCOSE 87 85 102* 101* 90  BUN 13 9 7 10  5*  CREATININE 2.21* 2.13* 2.02* 1.92* 1.76*  CALCIUM 8.5* 9.2 8.7* 8.7* 8.6*   GFR: Estimated Creatinine Clearance: 33.8 mL/min (A) (by C-G formula based on SCr of 1.76 mg/dL (H)). Liver Function Tests: Recent Labs  Lab 12/30/17 0515 12/31/17 0324 01/01/18 0521 01/02/18 0418 01/03/18 0548  AST 50* 46* 49* 44* 40  ALT 18 17 16 16 15   ALKPHOS 120 104 119 98 95  BILITOT 5.0* 4.4* 4.9* 4.7* 4.6*  PROT 7.5 6.8 7.2 6.7 7.3  ALBUMIN 3.0* 2.8* 2.9* 2.8* 2.7*   No results for input(s): LIPASE, AMYLASE in the last 168 hours. Recent Labs  Lab 01/03/18 0548  AMMONIA 50*   Coagulation Profile: No results for  input(s): INR, PROTIME in the last 168 hours. Cardiac Enzymes: No results for input(s): CKTOTAL, CKMB, CKMBINDEX, TROPONINI in the last 168 hours. BNP (last 3 results) No results for input(s): PROBNP in the last 8760 hours. HbA1C: No results for input(s): HGBA1C in the last 72 hours. CBG: Recent Labs  Lab 12/31/17 0752 01/01/18 0809 01/02/18 0815 01/03/18 0749 01/04/18 0805  GLUCAP 90 121* 122* 90 106*   Lipid Profile: No results for input(s): CHOL, HDL, LDLCALC, TRIG, CHOLHDL, LDLDIRECT in the last 72 hours. Thyroid Function Tests: No results for input(s): TSH, T4TOTAL, FREET4, T3FREE, THYROIDAB in the last 72 hours. Anemia Panel: No results for input(s): VITAMINB12, FOLATE, FERRITIN, TIBC, IRON, RETICCTPCT in the last 72 hours.  Sepsis Labs: Recent Labs  Lab 01/02/18 1541 01/02/18 1920  LATICACIDVEN 3.4* 1.5    Recent Results (from the past 240 hour(s))  Culture, blood (routine x 2)     Status: None (Preliminary result)   Collection Time: 01/01/18  4:32 PM  Result Value Ref Range Status   Specimen Description BLOOD RIGHT FOREARM  Final   Special Requests   Final    BOTTLES DRAWN AEROBIC AND ANAEROBIC Blood Culture adequate volume   Culture   Final    NO GROWTH 2 DAYS Performed at [facility], [address] [place], [place] [address]    Report Status PENDING  Incomplete  Culture, blood (routine x 2)     Status: None (Preliminary result)   Collection Time: 01/01/18  4:43 PM  Result Value Ref Range Status   Specimen Description BLOOD LEFT WRIST  Final   Special Requests   Final    BOTTLES DRAWN AEROBIC AND ANAEROBIC Blood Culture adequate volume   Culture   Final    NO GROWTH 2 DAYS Performed at [facility], [place] [address] [place], [place] [address]    Report Status PENDING  Incomplete  Urine Culture     Status: Abnormal   Collection Time: 01/01/18 10:32 PM  Result Value Ref Range Status   Specimen Description URINE, RANDOM  Final   Special  Requests NONE  Final   Culture (A)  Final    <10,000 COLONIES/mL INSIGNIFICANT GROWTH Performed at [facility], [place] [address] [place], [place] [address]    Report Status 01/03/2018 FINAL  Final         Radiology Studies: US Abdomen Limited  Result Date: 01/03/2018 CLINICAL DATA:  Abdominal distension EXAM: LIMITED ABDOMEN ULTRASOUND FOR ASCITES TECHNIQUE: Limited ultrasound survey for ascites was performed in all four abdominal quadrants. COMPARISON:  December 28, 2017 abdominal examination and CT abdomen and pelvis December 24, 2017 FINDINGS: There is a rather minimal amount of ascites seen in the left upper and lower quadrant regions. Multiple loops of peristalsing bowel are noted throughout the abdomen pelvis. IMPRESSION: Rather minimal ascites on the left. Electronically Signed   By: Lowella Grip III M.D.   On: 01/03/2018 21:48        Scheduled Meds: . colestipol  1 g Oral BID  . folic acid  1 mg Oral Daily  . hydrocortisone  25 mg Rectal BID  . levothyroxine  75 mcg Oral QAC breakfast  . lipase/protease/amylase  72,000 Units Oral TID WC  . multivitamin with minerals  1 tablet Oral Daily  . nicotine  21 mg Transdermal Daily  . pantoprazole (PROTONIX) IV  40 mg Intravenous Q12H  . pregabalin  50 mg Oral BID  . thiamine  100 mg Oral Daily   Continuous Infusions: . sodium chloride    . cefTRIAXone (ROCEPHIN)  IV Stopped (01/04/18 1041)  .  sodium bicarbonate  infusion 1000 mL 50 mL/hr at 01/04/18 0952     LOS: 11 days    Time spent: 35 minutes.     Elmarie Shiley, MD [facility] Pager [phone]  If 7PM-7AM, please contact night-coverage [email or web address] Password [email or web address] 01/04/2018, 2:40 PM

## 2018-01-04 NOTE — Progress Notes (Signed)
Temp 98.10F oral patient shivering, no confusion noted.  Given tylenol

## 2018-01-04 NOTE — Plan of Care (Signed)
  Progressing Education: Knowledge of General Education information will improve 01/04/2018 0235 - Progressing by Verne Grain, RN Note No signs of confusion overnight No fever noted but patient was shivering at shift change, given tylenol and in hour no shivering noted Educated patient that lactulose helps release built up toxins not filtered by liver through BM so that is why she is having increase in diarrhea. Patient understood.

## 2018-01-04 NOTE — Progress Notes (Signed)
Events over the weekend have been noted.  The one diagnosis that might be possible is Wilson's disease.  She does have a lot of the findings for Wilson's disease.  There is a family history of cirrhosis not related to alcohol use.  I would go ahead and check her ceruloplasmin level.  I will also check a serum copper level.  We will also check a 24-hour urine for copper excretion.  I know that Wilson's disease is rare.  However, I really have a hard time explaining what is going on with her.  There are no labs right now.  Yesterday, her creatinine was 1.92.  Her potassium 3.9.  Her hemoglobin was 8.1.  Albumin was 2.7.  She has some slight tremors this morning.  Her appetite is okay.  There is no nausea or vomiting.  She may have some loose stool.  There is no obvious bleeding.  I saw that Dr. Henrene Pastor from gastroenterology saw her late last week.  She had an abdominal ultrasound over the weekend.  She had minimal ascites.  This was a limited abdominal ultrasound so no comment was made on the liver.   Her physical exam shows stable vital signs.  We will have to see what this Wilson's disease diagnosis workup shows.  I am certainly praying that we can figure out what is going on with her.  Lattie Haw, MD  Jeneen Rinks 1:19

## 2018-01-04 NOTE — Progress Notes (Signed)
PT Cancellation Note  [name] MRN: [id number] DOB: 07/16/1971   Cancelled Treatment:    Reason Eval/Treat Not Completed: Fatigue/lethargy limiting ability to participate.  Pt is in the bed shivering uncontrollably and reporting nausea.  RN in room and aware.  Pt did not feel up for getting up with therapy today, but agreeable for [name] to check back tomorrow.  Thanks,    Barbarann Ehlers. [place], [place], DPT [phone]   01/04/2018, 3:02 PM

## 2018-01-05 LAB — RETICULOCYTES
RBC.: 2.56 MIL/uL — AB (ref 3.87–5.11)
RETIC CT PCT: 4.1 % — AB (ref 0.4–3.1)
Retic Count, Absolute: 105 10*3/uL (ref 19.0–186.0)

## 2018-01-05 LAB — GLUCOSE, CAPILLARY: Glucose-Capillary: 88 mg/dL (ref 65–99)

## 2018-01-05 LAB — BASIC METABOLIC PANEL
Anion gap: 13 (ref 5–15)
BUN: 6 mg/dL (ref 6–20)
CALCIUM: 8.6 mg/dL — AB (ref 8.9–10.3)
CO2: 20 mmol/L — ABNORMAL LOW (ref 22–32)
CREATININE: 1.69 mg/dL — AB (ref 0.44–1.00)
Chloride: 104 mmol/L (ref 101–111)
GFR calc Af Amer: 41 mL/min — ABNORMAL LOW (ref >60)
GFR, EST NON AFRICAN AMERICAN: 35 mL/min — AB (ref >60)
Glucose, Bld: 115 mg/dL — ABNORMAL HIGH (ref 65–99)
Potassium: 3.2 mmol/L — ABNORMAL LOW (ref 3.5–5.1)
SODIUM: 137 mmol/L (ref 135–145)

## 2018-01-05 LAB — CBC
HEMATOCRIT: 25.4 % — AB (ref 36.0–46.0)
Hemoglobin: 8.2 g/dL — ABNORMAL LOW (ref 12.0–15.0)
MCH: 32.2 pg (ref 26.0–34.0)
MCHC: 32.3 g/dL (ref 30.0–36.0)
MCV: 99.6 fL (ref 78.0–100.0)
Platelets: 109 10*3/uL — ABNORMAL LOW (ref 150–400)
RBC: 2.55 MIL/uL — ABNORMAL LOW (ref 3.87–5.11)
WBC: 3.7 10*3/uL — AB (ref 4.0–10.5)

## 2018-01-05 LAB — AMMONIA: AMMONIA: 34 umol/L (ref 9–35)

## 2018-01-05 LAB — CERULOPLASMIN: CERULOPLASMIN: 29.6 mg/dL (ref 19.0–39.0)

## 2018-01-05 MED ORDER — DARBEPOETIN ALFA 300 MCG/0.6ML IJ SOSY
300.0000 ug | PREFILLED_SYRINGE | Freq: Once | INTRAMUSCULAR | Status: AC
  Administered 2018-01-06: 300 ug via SUBCUTANEOUS
  Filled 2018-01-05: qty 0.6

## 2018-01-05 MED ORDER — POTASSIUM CHLORIDE CRYS ER 20 MEQ PO TBCR
20.0000 meq | EXTENDED_RELEASE_TABLET | Freq: Once | ORAL | Status: AC
  Administered 2018-01-05: 20 meq via ORAL
  Filled 2018-01-05: qty 1

## 2018-01-05 MED ORDER — DIPHENHYDRAMINE HCL 12.5 MG/5ML PO ELIX: 12.5000 mg | ORAL_SOLUTION | Freq: Three times a day (TID) | ORAL | Status: DC | PRN

## 2018-01-05 NOTE — Progress Notes (Signed)
PT Cancellation Note  [name] MRN: [id number] DOB: [birth year]   Cancelled Treatment:    Reason Eval/Treat Not Completed: Fatigue/lethargy limiting ability to participate;Pain limiting ability to participate; patient continues with feeling poorly, describes headache, neck ache and fluid build up due to antibiotics.  Did visualize her getting to [facility] on her own as she reports doing this in the room.  Remains fall risk due to cords under her feet.  Will follow up another day for PT as tolerated.    Reginia Naas 01/05/2018, 3:02 PM Magda Kiel, [place] 01/05/2018

## 2018-01-05 NOTE — Progress Notes (Signed)
Ms. Currie really is no worse or no better.  She says she is having diarrhea.  I am not sure exactly how bad this is.  She did not do physical therapy yesterday.  All of her cultures are negative.  We are doing the workup for Wilson's disease.  She said that her neurologist had done a similar workup couple years ago.  She was told that she had a high copper level.  Again I am not sure exactly what this means.  I am unsure how to get results from this workup.  Her labs today her hemoglobin to be 8.2.  I would go ahead and give her another dose of Aranesp today.  Her potassium is 3.2.  Her platelet count is 109,000.  Her  renal function improving.  Her creatinine is 1.69.  Her physical exam shows a temperature be 98.8.  Her blood pressure is 123/72.  I find nothing new on her physical exam.  Again, it will be incredibly fascinating to see the results of her ceruloplasmin, serum copper, urinary copper excretion.   I think if there is any suggestion that there is Wilson's disease, then I would probably start penicillamine on her.  I am reassured that her bone marrow does not show any obvious malignancy that is causing these issues.  As always, she is very nice.  It is a lot of fun talking with her.  Lattie Haw, MD  [place] 11:1

## 2018-01-05 NOTE — Progress Notes (Signed)
PROGRESS NOTE    [name]  VOZ:[id number] DOB: 21-Nov-1970 DOA: 12/23/2017 PCP: Ladell Pier, MD   Brief Narrative by Dr Lonny Prude: "'Jamie Allen is a 47 y.o. femalewith medical history significant ofcirrhosis (unclear if NASH vs Alcoholic), GERD, HTN, Anemia, andrecurrent C diff(s/p offecal transplant),alcohol abuse, tobacco abuse, GERD, hypothyroidism, depression, anxiety, drug-seeking behavior. She presents with fevers/chills/nausea and vomiting concerning for sepsis with shock. Also with significant anemia of unknown cause. S/p 4 units of PRBC to date. No definite source". She received 3 days of IV zosyn and subsequently oral Cefuroxime. She underwent BM biopsy. Her Hb remain stable and she was going to be discharge on 3-15, but patient spike fever and that night she had an episode of blood in the stool.  She complained of dysuria. We proceed with pan-culture again, blood culture negative, chest x ray negative. She was started on ceftriaxone to treat UTI, urine culture without significant growth. She received another unit PRBC> she also develops hepatic encephalopathy, ammonia mildly elevated. She was started on lactulose. Due to diarrhea lactulose has been on hold. She is now afebrile, confusion has improved. BM negative for malignancy.   Assessment & Plan:   Principal Problem:   Septic shock ([place]) Active Problems:   Anxiety and depression   Hypothyroidism   GERD (gastroesophageal reflux disease)   Abdominal pain   C. difficile enteritis   Alcoholic cirrhosis of liver (HCC)   Tobacco abuse   Macrocytic anemia   Thrombocytopenia (HCC)   Alcohol abuse   Internal bleeding hemorrhoids  1-Septic Shock;  Received 3 days of Zosyn and 4 days of cefuroxime [name] no significant ascites, pelvic US small amount of physiologic fluid.  Ct abdomen negative for infection.  -Patient spike fever 3-15. Pan-culture again. Follow blood culture no growth  , urine culture 10,000  colonies. Marland Kitchen  UA with large leukocyte, WBC 6-30.  Chest xray: no active diseases. Repeated US no significant ascites.  -Started IV ceftriaxone 3-16 -afebrile today.   Anemia;  B 12, folic acid normal. Cold agglutinin titter negative.  No evidence of active bleeding. Received 4 units of PRBC Appreciate Dr Marin Olp help. Plan for BM biopsy hypercellular.  Acute blood loss anemia 3-16. Report bloody stool. Transfuse one unit 3-16./ Evaluated by GI, they think is hemorrhoids.  Hb today at 7.6. She denies significant bleeding.  Hb today at 8. Patient will get another dose of aranesp.   GI bleed; no further bleeding.  IV  Ceftriaxone to cover for SBP.  Evaluated By Gi, thought to be related to hemorrhoids.  Hb stable. No further bleed today/  IV protonix.   Metabolic acidosis; suspect related to diarrhea.  Stop IV bicarb Gtt.   Alcoholic Cirrhosis;  Last drink a week ago.  LFT stable. , bili decreased.  Dr Marin Olp evaluating patient for wilson diseases.   History of C dif Enteritis;  C diff negative.   Thrombocytopenia Secondary to cirrhosis. Stable.   Vancomycin-induced nephrotoxicity  Vanc trough resulted was 56 (elevated). Vancomycin discontinued. No hydronephrosis on renal ultrasound. Creatinine continues to improve.  Renal function slowly improving. Cr at 1.6  Peripheral neuropathy. On lyrica, dose reduce due to AKI.   GERD -Continue Protonix  Hypothyroidism -Continue Synthroid  Encephalopathy. Confuse. Somnolent; hold atarax. Discontinue morphine.  Hepatic encephalopathy vs infection.  Started lactulose. Hold lactulose today due to multiples BM  Treating for infection.     DVT prophylaxis: scd Code Status: full code.  Family Communication: care discussed with patient.  Disposition Plan: home when hb stable and no further fevers    Consultants:   Hematologist    Procedures:   BM biopsy   Antimicrobials:   Vancomycin (3/6>>3/8)  Zosyn  (3/6>>3/9)  Vantin (3/10>>     Subjective: She is alert today, appears less confuse. No fever overnight.    Objective: Vitals:   01/05/18 0627 01/05/18 0816 01/05/18 0914 01/05/18 1248  BP: 123/72 130/72 (!) 118/57 122/66  Pulse: 88 87 86 78  Resp: 17 16 (!) 21 18  Temp: 98.8 F (37.1 C) 98.1 F (36.7 C) 99.3 F (37.4 C) 98.7 F (37.1 C)  TempSrc: Oral Oral Oral Oral  SpO2: 97% 96% 97% 94%  Weight:      Height:        Intake/Output Summary (Last 24 hours) at 01/05/2018 1408 Last data filed at 01/05/2018 1338 Gross per 24 hour  Intake 1974 ml  Output 700 ml  Net 1274 ml   Filed Weights   12/23/17 2019 12/24/17 1634  Weight: 60.3 kg (133 lb) 63.9 kg (140 lb 14 oz)    Examination:  General exam: NAD Respiratory system; CTA Cardiovascular system: S 1, S 2 RRR Gastrointestinal system: BS present, soft, nt Central nervous system: alert, non focal.  Extremities: symmetric power.  Skin: No rash  Psychiatry: confuse   Data Reviewed: I have personally reviewed following labs and imaging studies  CBC: Recent Labs  Lab 12/30/17 0515 12/31/17 0324 01/01/18 0521 01/02/18 0418  01/02/18 2123 01/03/18 0548 01/04/18 0644 01/04/18 1941 01/05/18 0531  WBC 3.9* 3.4* 2.7* 3.0*  --   --   --  3.8*  --  3.7*  NEUTROABS 2.6 1.9 1.8 2.3  --   --   --   --   --   --   HGB 8.0* 7.3* 8.1* 7.0*   < > 7.6* 8.1* 7.5* 7.3* 8.2*  HCT 25.2* 22.8* 25.1* 21.1*   < > 23.3* 25.2* 23.2* 23.2* 25.4*  MCV 98.8 99.1 98.8 100.5*  --   --   --  97.9  --  99.6  PLT 102* 101* 107* 99*  --   --   --  118*  --  109*   < > = values in this interval not displayed.   Basic Metabolic Panel: Recent Labs  Lab 01/01/18 0521 01/02/18 0418 01/03/18 0548 01/04/18 0644 01/05/18 0531  NA 136 135 139 139 137  K 4.1 4.0 3.9 3.5 3.2*  CL 104 106 112* 114* 104  CO2 22 19* 18* 15* 20*  GLUCOSE 85 102* 101* 90 115*  BUN 9 7 10  5* 6  CREATININE 2.13* 2.02* 1.92* 1.76* 1.69*  CALCIUM 9.2 8.7* 8.7*  8.6* 8.6*   GFR: Estimated Creatinine Clearance: 35.2 mL/min (A) (by C-G formula based on SCr of 1.69 mg/dL (H)). Liver Function Tests: Recent Labs  Lab 12/30/17 0515 12/31/17 0324 01/01/18 0521 01/02/18 0418 01/03/18 0548  AST 50* 46* 49* 44* 40  ALT 18 17 16 16 15   ALKPHOS 120 104 119 98 95  BILITOT 5.0* 4.4* 4.9* 4.7* 4.6*  PROT 7.5 6.8 7.2 6.7 7.3  ALBUMIN 3.0* 2.8* 2.9* 2.8* 2.7*   No results for input(s): LIPASE, AMYLASE in the last 168 hours. Recent Labs  Lab 01/03/18 0548 01/05/18 0531  AMMONIA 50* 34   Coagulation Profile: No results for input(s): INR, PROTIME in the last 168 hours. Cardiac Enzymes: No results for input(s): CKTOTAL, CKMB, CKMBINDEX, TROPONINI in the last 168 hours. BNP (  last 3 results) No results for input(s): PROBNP in the last 8760 hours. HbA1C: No results for input(s): HGBA1C in the last 72 hours. CBG: Recent Labs  Lab 01/01/18 0809 01/02/18 0815 01/03/18 0749 01/04/18 0805 01/05/18 0815  GLUCAP 121* 122* 90 106* 88   Lipid Profile: No results for input(s): CHOL, HDL, LDLCALC, TRIG, CHOLHDL, LDLDIRECT in the last 72 hours. Thyroid Function Tests: No results for input(s): TSH, T4TOTAL, FREET4, T3FREE, THYROIDAB in the last 72 hours. Anemia Panel: Recent Labs    01/05/18 0531  RETICCTPCT 4.1*   Sepsis Labs: Recent Labs  Lab 01/02/18 1541 01/02/18 1920  LATICACIDVEN 3.4* 1.5    Recent Results (from the past 240 hour(s))  Culture, blood (routine x 2)     Status: None (Preliminary result)   Collection Time: 01/01/18  4:32 PM  Result Value Ref Range Status   Specimen Description BLOOD RIGHT FOREARM  Final   Special Requests   Final    BOTTLES DRAWN AEROBIC AND ANAEROBIC Blood Culture adequate volume   Culture   Final    NO GROWTH 4 DAYS Performed at [facility], [place] [address] [place], [place] [address]    Report Status PENDING  Incomplete  Culture, blood (routine x 2)     Status: None (Preliminary result)    Collection Time: 01/01/18  4:43 PM  Result Value Ref Range Status   Specimen Description BLOOD LEFT WRIST  Final   Special Requests   Final    BOTTLES DRAWN AEROBIC AND ANAEROBIC Blood Culture adequate volume   Culture   Final    NO GROWTH 4 DAYS Performed at [facility], [place] [address] [place], [place] [address]    Report Status PENDING  Incomplete  Urine Culture     Status: Abnormal   Collection Time: 01/01/18 10:32 PM  Result Value Ref Range Status   Specimen Description URINE, RANDOM  Final   Special Requests NONE  Final   Culture (A)  Final    <10,000 COLONIES/mL INSIGNIFICANT GROWTH Performed at [facility], [place] [address] [place], [place] [address]    Report Status 01/03/2018 FINAL  Final         Radiology Studies: US Abdomen Limited  Result Date: 01/03/2018 CLINICAL DATA:  Abdominal distension EXAM: LIMITED ABDOMEN ULTRASOUND FOR ASCITES TECHNIQUE: Limited ultrasound survey for ascites was performed in all four abdominal quadrants. COMPARISON:  December 28, 2017 abdominal examination and CT abdomen and pelvis December 24, 2017 FINDINGS: There is a rather minimal amount of ascites seen in the left upper and lower quadrant regions. Multiple loops of peristalsing bowel are noted throughout the abdomen pelvis. IMPRESSION: Rather minimal ascites on the left. Electronically Signed   By: Lowella Grip III M.D.   On: 01/03/2018 21:48        Scheduled Meds: . colestipol  1 g Oral BID  . [START ON 01/06/2018] darbepoetin (ARANESP) injection - NON-DIALYSIS  300 mcg Subcutaneous Once  . folic acid  1 mg Oral Daily  . hydrocortisone  25 mg Rectal BID  . levothyroxine  75 mcg Oral QAC breakfast  . lipase/protease/amylase  72,000 Units Oral TID WC  . multivitamin with minerals  1 tablet Oral Daily  . nicotine  21 mg Transdermal Daily  . pantoprazole (PROTONIX) IV  40 mg Intravenous Q12H  . pregabalin  50 mg Oral BID  . thiamine  100 mg Oral Daily   Continuous  Infusions: . sodium chloride    .  cefTRIAXone (ROCEPHIN)  IV Stopped (01/05/18 1041)  .  sodium bicarbonate  infusion 1000 mL Stopped (01/05/18 1055)     LOS: 12 days    Time spent: 35 minutes.     Elmarie Shiley, MD [facility] Pager [phone]  If 7PM-7AM, please contact night-coverage [email or web address] Password [email or web address] 01/05/2018, 2:08 PM

## 2018-01-06 DIAGNOSIS — Z91041 Radiographic dye allergy status: Secondary | ICD-10-CM

## 2018-01-06 DIAGNOSIS — D61818 Other pancytopenia: Secondary | ICD-10-CM

## 2018-01-06 DIAGNOSIS — R42 Dizziness and giddiness: Secondary | ICD-10-CM

## 2018-01-06 DIAGNOSIS — R51 Headache: Secondary | ICD-10-CM

## 2018-01-06 DIAGNOSIS — K089 Disorder of teeth and supporting structures, unspecified: Secondary | ICD-10-CM

## 2018-01-06 DIAGNOSIS — D649 Anemia, unspecified: Secondary | ICD-10-CM | POA: Diagnosis present

## 2018-01-06 DIAGNOSIS — R140 Abdominal distension (gaseous): Secondary | ICD-10-CM

## 2018-01-06 DIAGNOSIS — R509 Fever, unspecified: Secondary | ICD-10-CM | POA: Diagnosis present

## 2018-01-06 DIAGNOSIS — K7460 Unspecified cirrhosis of liver: Secondary | ICD-10-CM

## 2018-01-06 DIAGNOSIS — R10819 Abdominal tenderness, unspecified site: Secondary | ICD-10-CM

## 2018-01-06 DIAGNOSIS — Z8719 Personal history of other diseases of the digestive system: Secondary | ICD-10-CM

## 2018-01-06 DIAGNOSIS — Z9889 Other specified postprocedural states: Secondary | ICD-10-CM

## 2018-01-06 DIAGNOSIS — R110 Nausea: Secondary | ICD-10-CM

## 2018-01-06 DIAGNOSIS — D62 Acute posthemorrhagic anemia: Secondary | ICD-10-CM

## 2018-01-06 DIAGNOSIS — D638 Anemia in other chronic diseases classified elsewhere: Secondary | ICD-10-CM

## 2018-01-06 DIAGNOSIS — F17210 Nicotine dependence, cigarettes, uncomplicated: Secondary | ICD-10-CM

## 2018-01-06 DIAGNOSIS — Z881 Allergy status to other antibiotic agents status: Secondary | ICD-10-CM

## 2018-01-06 DIAGNOSIS — K0889 Other specified disorders of teeth and supporting structures: Secondary | ICD-10-CM

## 2018-01-06 DIAGNOSIS — R102 Pelvic and perineal pain: Secondary | ICD-10-CM

## 2018-01-06 DIAGNOSIS — R330 Drug induced retention of urine: Secondary | ICD-10-CM

## 2018-01-06 DIAGNOSIS — R5381 Other malaise: Secondary | ICD-10-CM

## 2018-01-06 DIAGNOSIS — N179 Acute kidney failure, unspecified: Secondary | ICD-10-CM | POA: Diagnosis present

## 2018-01-06 DIAGNOSIS — R5383 Other fatigue: Secondary | ICD-10-CM

## 2018-01-06 DIAGNOSIS — Z885 Allergy status to narcotic agent status: Secondary | ICD-10-CM

## 2018-01-06 LAB — CBC
HEMATOCRIT: 22.5 % — AB (ref 36.0–46.0)
HEMOGLOBIN: 7.4 g/dL — AB (ref 12.0–15.0)
MCH: 31.9 pg (ref 26.0–34.0)
MCHC: 32.9 g/dL (ref 30.0–36.0)
MCV: 97 fL (ref 78.0–100.0)
Platelets: 93 10*3/uL — ABNORMAL LOW (ref 150–400)
RBC: 2.32 MIL/uL — AB (ref 3.87–5.11)
WBC: 4.1 10*3/uL (ref 4.0–10.5)

## 2018-01-06 LAB — GLUCOSE, CAPILLARY: Glucose-Capillary: 95 mg/dL (ref 65–99)

## 2018-01-06 LAB — TYPE AND SCREEN
ABO/RH(D): O NEG
Antibody Screen: POSITIVE
DAT, IgG: NEGATIVE
Donor AG Type: NEGATIVE
Donor AG Type: NEGATIVE
Unit division: 0
Unit division: 0

## 2018-01-06 LAB — BPAM RBC
Blood Product Expiration Date: 201903252359
Blood Product Expiration Date: 201904032359
ISSUE DATE / TIME: 201903161032
Unit Type and Rh: 9500
Unit Type and Rh: 9500

## 2018-01-06 LAB — BASIC METABOLIC PANEL
Anion gap: 12 (ref 5–15)
BUN: 5 mg/dL — ABNORMAL LOW (ref 6–20)
CHLORIDE: 103 mmol/L (ref 101–111)
CO2: 19 mmol/L — AB (ref 22–32)
Calcium: 8.1 mg/dL — ABNORMAL LOW (ref 8.9–10.3)
Creatinine, Ser: 1.54 mg/dL — ABNORMAL HIGH (ref 0.44–1.00)
GFR calc non Af Amer: 39 mL/min — ABNORMAL LOW (ref >60)
GFR, EST AFRICAN AMERICAN: 45 mL/min — AB (ref >60)
Glucose, Bld: 122 mg/dL — ABNORMAL HIGH (ref 65–99)
POTASSIUM: 3.1 mmol/L — AB (ref 3.5–5.1)
Sodium: 134 mmol/L — ABNORMAL LOW (ref 135–145)

## 2018-01-06 LAB — CULTURE, BLOOD (ROUTINE X 2)
Culture: NO GROWTH
Culture: NO GROWTH
SPECIAL REQUESTS: ADEQUATE
SPECIAL REQUESTS: ADEQUATE

## 2018-01-06 LAB — COPPER, SERUM: Copper: 120 ug/dL (ref 72–166)

## 2018-01-06 LAB — OCCULT BLOOD X 1 CARD TO LAB, STOOL: FECAL OCCULT BLD: NEGATIVE

## 2018-01-06 NOTE — Progress Notes (Addendum)
PROGRESS NOTE    Jamie Allen  KDT:[id number] DOB: 1971-05-15 DOA: 12/23/2017 PCP: Ladell Pier, MD   Brief Narrative:  47 y.o. femalewith medical history significant ofcirrhosis (unclear if NASH vs Alcoholic), GERD, HTN, Anemia, andrecurrent C diff(s/p offecal transplant),alcohol abuse, tobacco abuse, GERD, hypothyroidism, depression, anxiety, drug-seeking behavior. She presents with fevers/chills/nausea and vomiting concerning for sepsis with shock. Also with significant anemia of unknown cause. S/p 4 units of PRBC to date.    Thus far no source has been found for patient's continued fever.  She was initially started on antibiotics for urinary tract infection however urine culture on 12/24/2017 returned no growth.  Of note patient was given antibiotics prior to cultures being drawn.  Patient has had history of C. difficile colitis in the past.  She was recently placed on lactulose which is been held due to loose stools.  Hematology has been consulted.  Patient underwent bone marrow biopsy which was unrevealing.  Hematology feels patient could have Wilson's disease.  Given continued fever spikes, infectious disease consulted and appreciated.  Assessment & Plan   Septic shock -Presented with fever, tachycardia, tachypnea, hypotension -Upon admission, PCCM consulted and patient was started on Levophed temporarily however her blood pressures did improve. -Respiratory viral panel negative -Thought to be secondary to possible urinary tract infection on admission however urine culture was negative on 12/24/2017.  Repeat urine culture on 01/01/2018 showed 10,000 colonies, insignificant growth. -Patient was initially placed on vancomycin and Zosyn and then switched to Vantin.  She currently is on ceftriaxone -CT of the abdomen pelvis was negative for infection -Patient mildly febrile today 100.2 F -Infectious disease consulted and appreciated  Macrocytic anemia -Patient has received 4  units PRBC to date -Hematology, Dr.Ennever, consulted and appreciated.  -Status post bone marrow biopsy by interventional radiology which showed Hypercellular bone marrow with trilineage hematopoiesis.  Peripheral blood showed pancytopenia. -Gastroenterology consulted and appreciated as there was a report of bloody stool-felt that bleeding was due to hemorrhoids -hemoglobin currently 7.4 -FOBT ordered -monitor CBC  GI bleed -As above, there was a report of bloody stool, gastroenterology was consulted and felt that this was likely due to hemorrhoids -Hemoglobin currently 7.4 -Continue PPI -Monitor CBC  Metabolic acidosis -Possibly secondary to diarrhea, patient was on bicarb drip which has been discontinued  Alcoholic cirrhosis -Supposedly last drink was 1 week prior to admission -Patient did have an increased bilirubin however does appear to be trending downward -Hematology would like to evaluate for Wilson's disease, as serum ceruloplasmin level 30, serum copper 120. Pending 24hr urine copper. Patient may need liver biopsy  Acute encephalopathy -Appears to be alert and oriented at this time -Thought to be secondary to hepatic versus infection -As above patient was started on lactulose however this was held due to multiple bowel movements -Morphine and atarax were discontinued  History of C. difficile enteritis -C. difficile PCR negative -Patient received fecal transplant  Thrombocytopenia -Likely due to cirrhosis, currently stable -Continue to monitor CBC  Vancomycin induced nephrotoxicity -Renal ultrasound shows no hydronephrosis -Creatinine improved, currently 1.54 -continue to monitor BMP  Peripheral neuropathy -Continue Lyrica  Hypothyroidism -Continue Synthroid  Abdominal/ RLQ/pelvic pain -Resolved -CT scan was unremarkable -Ultrasound showed some fluid otherwise unremarkable -No longer complaining of abdominal or pelvic pain  GERD -Continue PPI  DVT  Prophylaxis  SCDs  Code Status: Full  Family Communication: None at bedside  Disposition Plan: Admitted. Dispo pending- continues to spike fevers  Consultants Hematology Gastroenterology Interventional radiology (for bone  marrow biopsy) Infectious disease PCCM  Procedures  Bone marrow biopsy  Antibiotics   Anti-infectives (From admission, onward)   Start     Dose/Rate Route Frequency Ordered Stop   01/02/18 1800  cefTRIAXone (ROCEPHIN) 2 g in sodium chloride 0.9 % 100 mL IVPB  Status:  Discontinued     2 g 200 mL/hr over 30 Minutes Intravenous Every 24 hours 01/02/18 0737 01/02/18 0818   01/02/18 1000  cefTRIAXone (ROCEPHIN) 2 g in sodium chloride 0.9 % 100 mL IVPB     2 g 200 mL/hr over 30 Minutes Intravenous Every 24 hours 01/02/18 0818     01/01/18 1800  cefTRIAXone (ROCEPHIN) 1 g in sodium chloride 0.9 % 100 mL IVPB  Status:  Discontinued     1 g 200 mL/hr over 30 Minutes Intravenous Every 24 hours 01/01/18 1733 01/02/18 0737   12/27/17 1000  cefpodoxime (VANTIN) tablet 200 mg  Status:  Discontinued     200 mg Oral Daily 12/27/17 0912 12/30/17 0919   12/26/17 2200  piperacillin-tazobactam (ZOSYN) IVPB 2.25 g  Status:  Discontinued     2.25 g 100 mL/hr over 30 Minutes Intravenous Every 8 hours 12/26/17 1840 12/26/17 1842   12/26/17 1400  piperacillin-tazobactam (ZOSYN) IVPB 2.25 g  Status:  Discontinued     2.25 g 100 mL/hr over 30 Minutes Intravenous Every 8 hours 12/26/17 1000 12/27/17 0912   12/25/17 2300  vancomycin (VANCOCIN) 500 mg in sodium chloride 0.9 % 100 mL IVPB  Status:  Discontinued     500 mg 100 mL/hr over 60 Minutes Intravenous Every 12 hours 12/25/17 1246 12/26/17 0913   12/24/17 1100  vancomycin (VANCOCIN) IVPB 750 mg/150 ml premix  Status:  Discontinued     750 mg 150 mL/hr over 60 Minutes Intravenous Every 12 hours 12/23/17 2226 12/25/17 1246   12/24/17 0600  piperacillin-tazobactam (ZOSYN) IVPB 3.375 g  Status:  Discontinued     3.375 g 12.5  mL/hr over 240 Minutes Intravenous Every 8 hours 12/23/17 2244 12/26/17 0959   12/23/17 2300  piperacillin-tazobactam (ZOSYN) IVPB 3.375 g     3.375 g 100 mL/hr over 30 Minutes Intravenous  Once 12/23/17 2244 12/24/17 0112   12/23/17 2245  vancomycin (VANCOCIN) 1,250 mg in sodium chloride 0.9 % 250 mL IVPB     1,250 mg 166.7 mL/hr over 90 Minutes Intravenous  Once 12/23/17 2220 12/24/17 0132      Subjective:   Colandra Sharron seen and examined today.  Patient concerned that she continues to have fevers.  States that it makes her feel very worn out and tired.  Denies current chest pain, shortness of breath, abdominal pain, nausea or vomiting, dizziness or headache.  Objective:   Vitals:   01/06/18 0506 01/06/18 0511 01/06/18 0714 01/06/18 0756  BP: 115/61 (!) 113/55    Pulse: 94 89    Resp: (!) 22 (!) 22    Temp: 99.7 F (37.6 C)  98.7 F (37.1 C) 100.2 F (37.9 C)  TempSrc: Oral  Oral Oral  SpO2: 94% 96%    Weight:      Height:        Intake/Output Summary (Last 24 hours) at 01/06/2018 1407 Last data filed at 01/06/2018 0945 Gross per 24 hour  Intake 200 ml  Output 1300 ml  Net -1100 ml   Filed Weights   12/23/17 2019 12/24/17 1634  Weight: 60.3 kg (133 lb) 63.9 kg (140 lb 14 oz)    Exam  General: Well developed,  well nourished, NAD, appears stated age  89: NCAT, mucous membranes moist. Poor dentition  Neck: Supple  Cardiovascular: S1 S2 auscultated, soft SEM, RRR  Respiratory: Clear to auscultation bilaterally with equal chest rise  Abdomen: Soft, nontender, nondistended, + bowel sounds  Extremities: warm dry without cyanosis clubbing or edema  Neuro: AAOx3, nonfocal  Skin: Without rashes exudates or nodules  Psych: Normal affect and demeanor with intact judgement and insight   Data Reviewed: I have personally reviewed following labs and imaging studies  CBC: Recent Labs  Lab 12/31/17 0324 01/01/18 0521 01/02/18 0418  01/03/18 0548  01/04/18 0644 01/04/18 1941 01/05/18 0531 01/06/18 0650  WBC 3.4* 2.7* 3.0*  --   --  3.8*  --  3.7* 4.1  NEUTROABS 1.9 1.8 2.3  --   --   --   --   --   --   HGB 7.3* 8.1* 7.0*   < > 8.1* 7.5* 7.3* 8.2* 7.4*  HCT 22.8* 25.1* 21.1*   < > 25.2* 23.2* 23.2* 25.4* 22.5*  MCV 99.1 98.8 100.5*  --   --  97.9  --  99.6 97.0  PLT 101* 107* 99*  --   --  118*  --  109* 93*   < > = values in this interval not displayed.   Basic Metabolic Panel: Recent Labs  Lab 01/02/18 0418 01/03/18 0548 01/04/18 0644 01/05/18 0531 01/06/18 0650  NA 135 139 139 137 134*  K 4.0 3.9 3.5 3.2* 3.1*  CL 106 112* 114* 104 103  CO2 19* 18* 15* 20* 19*  GLUCOSE 102* 101* 90 115* 122*  BUN 7 10 5* 6 5*  CREATININE 2.02* 1.92* 1.76* 1.69* 1.54*  CALCIUM 8.7* 8.7* 8.6* 8.6* 8.1*   GFR: Estimated Creatinine Clearance: 38.6 mL/min (A) (by C-G formula based on SCr of 1.54 mg/dL (H)). Liver Function Tests: Recent Labs  Lab 12/31/17 0324 01/01/18 0521 01/02/18 0418 01/03/18 0548  AST 46* 49* 44* 40  ALT _0 ALKPHOS 104 119 98 95  BILITOT 4.4* 4.9* 4.7* 4.6*  PROT 6.8 7.2 6.7 7.3  ALBUMIN 2.8* 2.9* 2.8* 2.7*   No results for input(s): LIPASE, AMYLASE in the last 168 hours. Recent Labs  Lab 01/03/18 0548 01/05/18 0531  AMMONIA 50* 34   Coagulation Profile: No results for input(s): INR, PROTIME in the last 168 hours. Cardiac Enzymes: No results for input(s): CKTOTAL, CKMB, CKMBINDEX, TROPONINI in the last 168 hours. BNP (last 3 results) No results for input(s): PROBNP in the last 8760 hours. HbA1C: No results for input(s): HGBA1C in the last 72 hours. CBG: Recent Labs  Lab 01/02/18 0815 01/03/18 0749 01/04/18 0805 01/05/18 0815 01/06/18 0754  GLUCAP 122* 90 106* 88 95   Lipid Profile: No results for input(s): CHOL, HDL, LDLCALC, TRIG, CHOLHDL, LDLDIRECT in the last 72 hours. Thyroid Function Tests: No results for input(s): TSH, T4TOTAL, FREET4, T3FREE, THYROIDAB in the last 72  hours. Anemia Panel: Recent Labs    01/05/18 0531  RETICCTPCT 4.1*   Urine analysis:    Component Value Date/Time   COLORURINE YELLOW 01/01/2018 2232   APPEARANCEUR CLEAR 01/01/2018 2232   LABSPEC 1.008 01/01/2018 2232   PHURINE 6.0 01/01/2018 2232   GLUCOSEU NEGATIVE 01/01/2018 2232   HGBUR MODERATE (A) 01/01/2018 2232   BILIRUBINUR NEGATIVE 01/01/2018 2232   BILIRUBINUR negative 05/19/2016 1638   KETONESUR NEGATIVE 01/01/2018 2232   PROTEINUR NEGATIVE 01/01/2018 2232   UROBILINOGEN 0.2 05/19/2016 1638   UROBILINOGEN  0.2 08/17/2015 1853   NITRITE NEGATIVE 01/01/2018 2232   LEUKOCYTESUR LARGE (A) 01/01/2018 2232   Sepsis Labs: _0 (procalcitonin:4,lacticidven:4)  ) Recent Results (from the past 240 hour(s))  Culture, blood (routine x 2)     Status: None (Preliminary result)   Collection Time: 01/01/18  4:32 PM  Result Value Ref Range Status   Specimen Description BLOOD RIGHT FOREARM  Final   Special Requests   Final    BOTTLES DRAWN AEROBIC AND ANAEROBIC Blood Culture adequate volume   Culture   Final    NO GROWTH 4 DAYS Performed at [facility], [place] [address] [place], [place] [address]    Report Status PENDING  Incomplete  Culture, blood (routine x 2)     Status: None (Preliminary result)   Collection Time: 01/01/18  4:43 PM  Result Value Ref Range Status   Specimen Description BLOOD LEFT WRIST  Final   Special Requests   Final    BOTTLES DRAWN AEROBIC AND ANAEROBIC Blood Culture adequate volume   Culture   Final    NO GROWTH 4 DAYS Performed at [facility], [place] [address] [place], [place] [address]    Report Status PENDING  Incomplete  Urine Culture     Status: Abnormal   Collection Time: 01/01/18 10:32 PM  Result Value Ref Range Status   Specimen Description URINE, RANDOM  Final   Special Requests NONE  Final   Culture (A)  Final    <10,000 COLONIES/mL INSIGNIFICANT GROWTH Performed at [facility], [place] [address]  [place], [place] [address]    Report Status 01/03/2018 FINAL  Final      Radiology Studies: No results found.   Scheduled Meds: . colestipol  1 g Oral BID  . folic acid  1 mg Oral Daily  . hydrocortisone  25 mg Rectal BID  . levothyroxine  75 mcg Oral QAC breakfast  . lipase/protease/amylase  72,000 Units Oral TID WC  . multivitamin with minerals  1 tablet Oral Daily  . nicotine  21 mg Transdermal Daily  . pantoprazole (PROTONIX) IV  40 mg Intravenous Q12H  . pregabalin  50 mg Oral BID  . thiamine  100 mg Oral Daily   Continuous Infusions: . sodium chloride    . cefTRIAXone (ROCEPHIN)  IV Stopped (01/06/18 2481)     LOS: 13 days   Time Spent in minutes   30 minutes  Kathern Lobosco D.O. on 01/06/2018 at 2:07 PM  Between 7am to 7pm - Pager - [phone]  After 7pm go to [email or web address] - password [email or web address]  And look for the night coverage person covering for me after hours  [facility] Office  [phone]

## 2018-01-06 NOTE — Progress Notes (Addendum)
PT Cancellation Note  [name] MRN: [id number] DOB: 1971-03-15   Cancelled Treatment:    Reason Eval/Treat Not Completed: Patient declined, no reason specified;Fatigue/lethargy limiting ability to participate. Pt continues to be fatigued be low Hgb and refusing treatment secondary to headaches and light sensitivity. Checked back second time today and pt continues to refuse PT. Advised pt that PT will try again tomorrow, however if she continues to refuse PT we may need to remove her from the schedule.   Benjiman Core, PTA Pager [phone] Acute Rehab   Allena Katz 01/06/2018, 11:55 AM

## 2018-01-06 NOTE — Progress Notes (Signed)
Unfortunately, Jamie Allen had another low-grade temperature earlier this morning.  According to the nurse, it was 101 degrees.  She had some chills with this.  Her cultures have all been negative.  I do not know if an echocardiogram has been done or needs to be done.  I do not hear any type of murmur that would suggest a valvular issue.  I still am somewhat concerned about the possibility of her having Wilson's disease.  Her serum ceruloplasmin level is 30.  Her serum copper is 120.  However, her nonceruloplasmin-bound copper  level is high at 25.  This could potentially indicate Wilson's disease.  We really need to get the 24-hour urine copper level back.  This can help [name] out.  If the urine copper excretion is > 100 mcg, then I think we are looking at Wilson's disease.  Ultimately, she may need to have a liver biopsy.  Her hemoglobin is down to 7.4.  I do not think she is bleeding.  She did get a dose of Aranesp yesterday.  Her potassium is a little bit low.  Her renal function is improving with a creatinine of 1.54.  Her bone marrow that she had done last week did not show [name] anything obvious.  I am awaiting the results of the cytogenetics.  There is nothing else that I can think of test wise that we can do right now.   her physical exam is pretty much unremarkable.  She has no adenopathy.  She has really bad dentition.  As such, I do not know if some of this fever could be coming from oral bacteria.  She really does need to have her teeth taken out.  I am unsure if we need infectious disease to help [name] out right now.  I really feel bad that she just is not getting that much better.  This truly is a conundrum.  I will be out of town for the next couple days.  If there are any questions, my partners can certainly help out.  May be 1 of them will have a bit of insight as to what we might be able to do to get her better.  Jamie Haw, MD   Jamie Allen [phone]
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
## 2018-01-07 ENCOUNTER — Inpatient Hospital Stay (HOSPITAL_COMMUNITY): Payer: Medicaid Other

## 2018-01-07 DIAGNOSIS — I340 Nonrheumatic mitral (valve) insufficiency: Secondary | ICD-10-CM

## 2018-01-07 DIAGNOSIS — R011 Cardiac murmur, unspecified: Secondary | ICD-10-CM

## 2018-01-07 DIAGNOSIS — I361 Nonrheumatic tricuspid (valve) insufficiency: Secondary | ICD-10-CM

## 2018-01-07 LAB — HIV-1 RNA, PCR (GRAPH) RFX/GENO EDI: HIV-1 RNA QUANT, LOG: UNDETERMINED {Log_copies}/mL

## 2018-01-07 LAB — COMPREHENSIVE METABOLIC PANEL
ALK PHOS: 95 U/L (ref 38–126)
ALT: 12 U/L — ABNORMAL LOW (ref 14–54)
ANION GAP: 10 (ref 5–15)
AST: 33 U/L (ref 15–41)
Albumin: 2.5 g/dL — ABNORMAL LOW (ref 3.5–5.0)
BUN: 6 mg/dL (ref 6–20)
CALCIUM: 8.4 mg/dL — AB (ref 8.9–10.3)
CO2: 20 mmol/L — AB (ref 22–32)
Chloride: 107 mmol/L (ref 101–111)
Creatinine, Ser: 1.33 mg/dL — ABNORMAL HIGH (ref 0.44–1.00)
GFR calc non Af Amer: 47 mL/min — ABNORMAL LOW (ref >60)
GFR, EST AFRICAN AMERICAN: 54 mL/min — AB (ref >60)
Glucose, Bld: 93 mg/dL (ref 65–99)
POTASSIUM: 3.4 mmol/L — AB (ref 3.5–5.1)
SODIUM: 137 mmol/L (ref 135–145)
TOTAL PROTEIN: 6.5 g/dL (ref 6.5–8.1)
Total Bilirubin: 3.8 mg/dL — ABNORMAL HIGH (ref 0.3–1.2)

## 2018-01-07 LAB — HEPATITIS A ANTIBODY, IGM: Hep A IgM: NEGATIVE

## 2018-01-07 LAB — CBC
HCT: 22.5 % — ABNORMAL LOW (ref 36.0–46.0)
HEMOGLOBIN: 7.3 g/dL — AB (ref 12.0–15.0)
MCH: 31.3 pg (ref 26.0–34.0)
MCHC: 32.4 g/dL (ref 30.0–36.0)
MCV: 96.6 fL (ref 78.0–100.0)
Platelets: 87 10*3/uL — ABNORMAL LOW (ref 150–400)
RBC: 2.33 MIL/uL — ABNORMAL LOW (ref 3.87–5.11)
WBC: 3.1 10*3/uL — ABNORMAL LOW (ref 4.0–10.5)

## 2018-01-07 LAB — URINALYSIS, ROUTINE W REFLEX MICROSCOPIC
BILIRUBIN URINE: NEGATIVE
Glucose, UA: NEGATIVE mg/dL
Hgb urine dipstick: NEGATIVE
KETONES UR: NEGATIVE mg/dL
Leukocytes, UA: NEGATIVE
Nitrite: NEGATIVE
Protein, ur: NEGATIVE mg/dL
Specific Gravity, Urine: 1.008 (ref 1.005–1.030)
pH: 6 (ref 5.0–8.0)

## 2018-01-07 LAB — HCV RNA QUANT: HCV QUANT: NOT DETECTED [IU]/mL (ref >50)

## 2018-01-07 LAB — HEPATITIS C ANTIBODY: HCV Ab: 0.2 s/co ratio (ref 0.0–0.9)

## 2018-01-07 LAB — ECHOCARDIOGRAM COMPLETE
Height: 61 in
WEIGHTICAEL: 2253.98 [oz_av]

## 2018-01-07 LAB — HEPATITIS B SURFACE ANTIGEN: HEP B S AG: NEGATIVE

## 2018-01-07 LAB — CMV IGM

## 2018-01-07 LAB — GLUCOSE, CAPILLARY: Glucose-Capillary: 90 mg/dL (ref 65–99)

## 2018-01-07 MED ORDER — POTASSIUM CHLORIDE CRYS ER 20 MEQ PO TBCR
40.0000 meq | EXTENDED_RELEASE_TABLET | Freq: Once | ORAL | Status: AC
  Administered 2018-01-07: 40 meq via ORAL
  Filled 2018-01-07: qty 2

## 2018-01-07 MED ORDER — PANTOPRAZOLE SODIUM 40 MG PO TBEC
40.0000 mg | DELAYED_RELEASE_TABLET | Freq: Two times a day (BID) | ORAL | Status: DC
  Administered 2018-01-07 – 2018-01-08 (×2): 40 mg via ORAL
  Filled 2018-01-07 (×2): qty 1

## 2018-01-07 NOTE — Progress Notes (Signed)
Patient ID: Jamie Allen, female   DOB: July 05, 1971, 47 y.o.   MRN: [id number]          [facility]  Date of Admission:  12/23/2017    Total days of antibiotics 14        Day 7 ceftriaxone         ASSESSMENT: Because of her recent febrile illness and worsening cytopenias remains unclear.  Repeat blood cultures are negative.  She has no evidence for acute CMV, hepatitis A, hepatitis B, hepatitis C or other obvious infection.  PLAN: 1. Continue ceftriaxone for now pending results of cultures, TTE and further observation  Principal Problem:   Fever of unknown origin Active Problems:   Recurrent Clostridium difficile diarrhea   Septic shock ([place])   Acute kidney injury ([place])   Acute on chronic anemia   Anxiety and depression   Hypothyroidism   Hepatosplenomegaly   GERD (gastroesophageal reflux disease)   Obesity (BMI 30-39.9)   Anxiety   Pancytopenia (H[facility])   Peripheral neuropathy   Nonalcoholic steatohepatitis ([facility])   Hypertension   Tobacco abuse   Macrocytic anemia   Thrombocytopenia ([facility])   Alcohol abuse   Internal bleeding hemorrhoids   Scheduled Meds: . colestipol  1 g Oral BID  . folic acid  1 mg Oral Daily  . hydrocortisone  25 mg Rectal BID  . levothyroxine  75 mcg Oral QAC breakfast  . lipase/protease/amylase  72,000 Units Oral TID WC  . multivitamin with minerals  1 tablet Oral Daily  . nicotine  21 mg Transdermal Daily  . pantoprazole  40 mg Oral BID  . pregabalin  50 mg Oral BID  . thiamine  100 mg Oral Daily   Continuous Infusions: . sodium chloride    . cefTRIAXone (ROCEPHIN)  IV Stopped (01/07/18 1016)   PRN Meds:.acetaminophen, dicyclomine, diphenhydrAMINE, ondansetron (ZOFRAN) IV, oxyCODONE, zolpidem   SUBJECTIVE: She feels like she is starting to have another chill.  She is having loose stools but does not describe them as watery diarrhea.  Review of Systems: Review of Systems  Constitutional: Positive for  chills, fever and malaise/fatigue. Negative for diaphoresis.  HENT: Negative for congestion and sore throat.   Respiratory: Negative for cough, sputum production and shortness of breath.   Cardiovascular: Negative for chest pain.  Gastrointestinal: Negative for abdominal pain, diarrhea, nausea and vomiting.  Genitourinary: Negative for dysuria.  Skin: Negative for itching and rash.    Allergies  Allergen Reactions  . Iohexol Hives, Itching and Swelling  . Vancomycin   . Morphine And Related Itching and Other (See Comments)    Can tolerate with Benadryl    OBJECTIVE: Vitals:   01/07/18 0820 01/07/18 1215 01/07/18 1220 01/07/18 1600  BP: 107/63  (!) 95/56   Pulse: 70  70   Resp: 16  16   Temp: 98 F (36.7 C) 98 F (36.7 C) 98 F (36.7 C) 98.1 F (36.7 C)  TempSrc: Oral Oral Oral Oral  SpO2: 92%  94%   Weight:      Height:       Body mass index is 26.62 kg/m.  Physical Exam  Constitutional: She is oriented to person, place, and time.  She is resting quietly in bed with covers pulled up to her chin.  HENT:  Mouth/Throat: No oropharyngeal exudate.  Cardiovascular: Normal rate and regular rhythm.  Murmur heard. 1/6 systolic murmur  Pulmonary/Chest: Effort normal. She has no wheezes. She has no rales.  Abdominal: Soft. She exhibits no distension. There is no tenderness.  Neurological: She is alert and oriented to person, place, and time.  Skin: No rash noted.  Psychiatric: Mood and affect normal.    Lab Results Lab Results  Component Value Date   WBC 3.1 (L) 01/07/2018   HGB 7.3 (L) 01/07/2018   HCT 22.5 (L) 01/07/2018   MCV 96.6 01/07/2018   PLT 87 (L) 01/07/2018    Lab Results  Component Value Date   CREATININE 1.33 (H) 01/07/2018   BUN 6 01/07/2018   NA 137 01/07/2018   K 3.4 (L) 01/07/2018   CL 107 01/07/2018   CO2 20 (L) 01/07/2018    Lab Results  Component Value Date   ALT 12 (L) 01/07/2018   AST 33 01/07/2018   ALKPHOS 95 01/07/2018   BILITOT  3.8 (H) 01/07/2018     Microbiology: Recent Results (from the past 240 hour(s))  Culture, blood (routine x 2)     Status: None   Collection Time: 01/01/18  4:32 PM  Result Value Ref Range Status   Specimen Description BLOOD RIGHT FOREARM  Final   Special Requests   Final    BOTTLES DRAWN AEROBIC AND ANAEROBIC Blood Culture adequate volume   Culture   Final    NO GROWTH 5 DAYS Performed at [facility], [place] [address], [place], [place] [address]    Report Status 01/06/2018 FINAL  Final  Culture, blood (routine x 2)     Status: None   Collection Time: 01/01/18  4:43 PM  Result Value Ref Range Status   Specimen Description BLOOD LEFT WRIST  Final   Special Requests   Final    BOTTLES DRAWN AEROBIC AND ANAEROBIC Blood Culture adequate volume   Culture   Final    NO GROWTH 5 DAYS Performed at [facility], [place] [address] [place], [place] [address]    Report Status 01/06/2018 FINAL  Final  Urine Culture     Status: Abnormal   Collection Time: 01/01/18 10:32 PM  Result Value Ref Range Status   Specimen Description URINE, RANDOM  Final   Special Requests NONE  Final   Culture (A)  Final    <10,000 COLONIES/mL INSIGNIFICANT GROWTH Performed at [facility], [place] [address] [place], [place] [address]    Report Status 01/03/2018 FINAL  Final  Culture, blood (routine x 2)     Status: None (Preliminary result)   Collection Time: 01/06/18  4:45 PM  Result Value Ref Range Status   Specimen Description BLOOD RIGHT ANTECUBITAL  Final   Special Requests   Final    BOTTLES DRAWN AEROBIC AND ANAEROBIC Blood Culture adequate volume   Culture   Final    NO GROWTH < 24 HOURS Performed at [facility], [place] [address], [place], [place] [address]    Report Status PENDING  Incomplete  Culture, blood (routine x 2)     Status: None (Preliminary result)   Collection Time: 01/06/18  5:00 PM  Result Value Ref Range Status   Specimen Description BLOOD RIGHT FOREARM  Final     Special Requests   Final    BOTTLES DRAWN AEROBIC AND ANAEROBIC Blood Culture adequate volume   Culture   Final    NO GROWTH < 24 HOURS Performed at [facility], [place] [address] [place], [place] [address]    Report Status PENDING  Incomplete    Michel Bickers, MD [facility] [place] [phone] pager   [phone] cell 01/07/2018, 5:05 PM

## 2018-01-07 NOTE — Progress Notes (Signed)
Physical Therapy Treatment [name] MRN: [id number] DOB: July 17, 1971 Today's Date: 01/07/2018    History of Present Illness Jamie Allen is a 47 y.o. 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 She presents with fevers/chills/nausea and vomiting concerning for sepsis with shock. Also with significant anemia of unknown cause. S/p 3 units of PRBC as of 3/11, and Hgb 6.9    PT Comments    Patient received in bed sleeping with SpO2 at 84%, she was easily woken and pleasant, willing to participate in PT; note SpO2 increased back to 98% as soon as she woke, RN notified regarding possible O2 drop during sleep. BP initially 92/51 but improved to 112/73 with activity. Able to ambulate approximately 160f today with no device, assist for line management and fatigued at end of gait period, SpO2 remained in 90s with activity today and patient reports significant fatigue with activity as compared to prior PT sessions. Plan to provide patient with appropriate HEP next session as insurance limitations may prevent her from going to outpatient PT sessions. She was left in bed with all needs met this morning.     Follow Up Recommendations  Outpatient PT     Equipment Recommendations  None recommended by PT    Recommendations for Other Services       Precautions / Restrictions Precautions Precautions: Other (comment) Precaution Comments: Hypotensive, watch BP Restrictions Weight Bearing Restrictions: No    Mobility  Bed Mobility Overal bed mobility: Independent                Transfers Overall transfer level: Needs assistance Equipment used: None Transfers: Sit to/from Stand Sit to Stand: Supervision         General transfer comment: S for safety, assist for  line management   Ambulation/Gait Ambulation/Gait assistance: Supervision Ambulation Distance (Feet): 100 Feet Assistive device: None Gait Pattern/deviations: Step-through pattern;WFL(Within Functional Limits)     General Gait Details: gait pattern appears generally normal, patient's main impairment is deconditioning at this point    Stairs            Wheelchair Mobility    Modified Rankin (Stroke Patients Only)       Balance Overall balance assessment: Mild deficits observed, not formally tested                                          Cognition Arousal/Alertness: Awake/alert Behavior During Therapy: WFL for tasks assessed/performed Overall Cognitive Status: Within Functional Limits for tasks assessed                                        Exercises      General Comments General comments (skin integrity, edema, etc.): patient received sleeping with SpO2 on room air at 84%, quickly recovered to 98% when she woke up; educated RN regarding sats       Pertinent Vitals/Pain Pain Assessment: No/denies pain Faces Pain Scale: No hurt    Home Living                      Prior Function  PT Goals (current goals can now be found in the care plan section) Acute Rehab PT Goals Patient Stated Goal: get better and get home PT Goal Formulation: With patient Time For Goal Achievement: 01/12/18 Potential to Achieve Goals: Good Progress towards PT goals: Progressing toward goals    Frequency    Min 3X/week      PT Plan Discharge plan needs to be updated    Co-evaluation              AM-PAC PT "6 Clicks" Daily Activity  Outcome Measure  Difficulty turning over in bed (including adjusting bedclothes, sheets and blankets)?: None Difficulty moving from lying on back to sitting on the side of the bed? : None Difficulty sitting down on and standing up from a chair with arms (e.g., wheelchair, bedside  commode, etc,.)?: None Help needed moving to and from a bed to chair (including a wheelchair)?: None Help needed walking in hospital room?: None Help needed climbing 3-5 steps with a railing? : A Little 6 Click Score: 23    End of Session   Activity Tolerance: Patient tolerated treatment well Patient left: in bed;with call bell/phone within reach Nurse Communication: Other (comment)(SpO2 drop during sleep ) PT Visit Diagnosis: Other abnormalities of gait and mobility (R26.89);Dizziness and giddiness (R42)     Time: 4445-8483 PT Time Calculation (min) (ACUTE ONLY): 13 min  Charges:  $Gait Training: 8-22 mins                    G Codes:       Deniece Ree PT, DPT, CBIS  Supplemental Physical Therapist Jamie Allen   Pager [phone]

## 2018-01-07 NOTE — Progress Notes (Signed)
  Echocardiogram 2D Echocardiogram has been performed.  Jennette Dubin 01/07/2018, 2:42 PM

## 2018-01-07 NOTE — Progress Notes (Signed)
PROGRESS NOTE    Jamie Allen  XBL:[id number] DOB: [birth year] DOA: 12/23/2017 PCP: Ladell Pier, MD   Brief Narrative:  47 y.o. femalewith medical history significant ofcirrhosis (unclear if NASH vs Alcoholic), GERD, HTN, Anemia, andrecurrent C diff(s/p offecal transplant),alcohol abuse, tobacco abuse, GERD, hypothyroidism, depression, anxiety, drug-seeking behavior. She presents with fevers/chills/nausea and vomiting concerning for sepsis with shock. Also with significant anemia of unknown cause. S/p 4 units of PRBC to date.    Thus far no source has been found for patient's continued fever.  She was initially started on antibiotics for urinary tract infection however urine culture on 12/24/2017 returned no growth.  Of note patient was given antibiotics prior to cultures being drawn.  Patient has had history of C. difficile colitis in the past.  She was recently placed on lactulose which is been held due to loose stools.  Hematology has been consulted.  Patient underwent bone marrow biopsy which was unrevealing.  Hematology feels patient could have Wilson's disease.  Given continued fever spikes, infectious disease consulted and appreciated.  Assessment & Plan   Septic shock/persistant fever -Presented with fever, tachycardia, tachypnea, hypotension -Upon admission, PCCM consulted and patient was started on Levophed temporarily however her blood pressures did improve. -Respiratory viral panel negative -Thought to be secondary to possible urinary tract infection on admission however urine culture was negative on 12/24/2017.  Repeat urine culture on 01/01/2018 showed 10,000 colonies, insignificant growth. -Patient was initially placed on vancomycin and Zosyn and then switched to Vantin.  She currently is on ceftriaxone -CT of the abdomen pelvis was negative for infection -continues to spike fevers, Tmax 100.15F -Infectious disease consulted and appreciated, recommended repeating blood  cultures-pending, UA/culture-pending , hepatitis A/B/C serologies-unremarkable, CMV serology <230, HIV viral load  -will order echocardiogram  Macrocytic anemia -Patient has received 4 units PRBC to date -Hematology, Dr.Ennever, consulted and appreciated.  -Status post bone marrow biopsy by interventional radiology which showed Hypercellular bone marrow with trilineage hematopoiesis.  Peripheral blood showed pancytopenia. -Gastroenterology consulted and appreciated as there was a report of bloody stool-felt that bleeding was due to hemorrhoids -hemoglobin currently 7.3 -FOBT negative -monitor CBC  GI bleed -As above, there was a report of bloody stool, gastroenterology was consulted and felt that this was likely due to hemorrhoids -Hemoglobin currently 7.3 -Continue PPI -Monitor CBC  Metabolic acidosis -Possibly secondary to diarrhea, patient was on bicarb drip which has been discontinued  Alcoholic cirrhosis -Supposedly last drink was 1 week prior to admission -Patient did have an increased bilirubin however does appear to be trending downward -Hematology would like to evaluate for Wilson's disease, as serum ceruloplasmin level 30, serum copper 120. Pending 24hr urine copper. Patient may need liver biopsy  Acute encephalopathy -Appears to be alert and oriented at this time -Thought to be secondary to hepatic versus infection -As above patient was started on lactulose however this was held due to multiple bowel movements -Morphine and atarax were discontinued  History of C. difficile enteritis -C. difficile PCR negative -Patient received fecal transplant  Thrombocytopenia, chronic -Likely due to cirrhosis, currently stable -Continue to monitor CBC  Vancomycin induced nephrotoxicity -Renal ultrasound shows no hydronephrosis -Creatinine improved, currently 1.54 -continue to monitor BMP  Peripheral neuropathy -Continue Lyrica  Hypothyroidism -Continue  Synthroid  Abdominal/ RLQ/pelvic pain -Resolved -CT scan was unremarkable -Ultrasound showed some fluid otherwise unremarkable -No longer complaining of abdominal or pelvic pain  GERD -Continue PPI  DVT Prophylaxis  SCDs  Code Status: Full  Family Communication:  None at bedside  Disposition Plan: Admitted. Dispo pending- continues to spike fevers  Consultants Hematology Gastroenterology Interventional radiology (for bone marrow biopsy) Infectious disease PCCM  Procedures  Bone marrow biopsy  Antibiotics   Anti-infectives (From admission, onward)   Start     Dose/Rate Route Frequency Ordered Stop   01/02/18 1800  cefTRIAXone (ROCEPHIN) 2 g in sodium chloride 0.9 % 100 mL IVPB  Status:  Discontinued     2 g 200 mL/hr over 30 Minutes Intravenous Every 24 hours 01/02/18 0737 01/02/18 0818   01/02/18 1000  cefTRIAXone (ROCEPHIN) 2 g in sodium chloride 0.9 % 100 mL IVPB     2 g 200 mL/hr over 30 Minutes Intravenous Every 24 hours 01/02/18 0818     01/01/18 1800  cefTRIAXone (ROCEPHIN) 1 g in sodium chloride 0.9 % 100 mL IVPB  Status:  Discontinued     1 g 200 mL/hr over 30 Minutes Intravenous Every 24 hours 01/01/18 1733 01/02/18 0737   12/27/17 1000  cefpodoxime (VANTIN) tablet 200 mg  Status:  Discontinued     200 mg Oral Daily 12/27/17 0912 12/30/17 0919   12/26/17 2200  piperacillin-tazobactam (ZOSYN) IVPB 2.25 g  Status:  Discontinued     2.25 g 100 mL/hr over 30 Minutes Intravenous Every 8 hours 12/26/17 1840 12/26/17 1842   12/26/17 1400  piperacillin-tazobactam (ZOSYN) IVPB 2.25 g  Status:  Discontinued     2.25 g 100 mL/hr over 30 Minutes Intravenous Every 8 hours 12/26/17 1000 12/27/17 0912   12/25/17 2300  vancomycin (VANCOCIN) 500 mg in sodium chloride 0.9 % 100 mL IVPB  Status:  Discontinued     500 mg 100 mL/hr over 60 Minutes Intravenous Every 12 hours 12/25/17 1246 12/26/17 0913   12/24/17 1100  vancomycin (VANCOCIN) IVPB 750 mg/150 ml premix  Status:   Discontinued     750 mg 150 mL/hr over 60 Minutes Intravenous Every 12 hours 12/23/17 2226 12/25/17 1246   12/24/17 0600  piperacillin-tazobactam (ZOSYN) IVPB 3.375 g  Status:  Discontinued     3.375 g 12.5 mL/hr over 240 Minutes Intravenous Every 8 hours 12/23/17 2244 12/26/17 0959   12/23/17 2300  piperacillin-tazobactam (ZOSYN) IVPB 3.375 g     3.375 g 100 mL/hr over 30 Minutes Intravenous  Once 12/23/17 2244 12/24/17 0112   12/23/17 2245  vancomycin (VANCOCIN) 1,250 mg in sodium chloride 0.9 % 250 mL IVPB     1,250 mg 166.7 mL/hr over 90 Minutes Intravenous  Once 12/23/17 2220 12/24/17 0132      Subjective:   Jamie Allen seen and examined today.  States she had a fever overnight and was not able to sleep well. Currently denies chest pain, shortness of breath, N/V/C.  Objective:   Vitals:   01/07/18 0419 01/07/18 0446 01/07/18 0757 01/07/18 0820  BP: 118/63   107/63  Pulse: 88   70  Resp: (!) 25   16  Temp:  99 F (37.2 C) 98 F (36.7 C) 98 F (36.7 C)  TempSrc:   Oral Oral  SpO2: 93%   92%  Weight:      Height:        Intake/Output Summary (Last 24 hours) at 01/07/2018 1209 Last data filed at 01/07/2018 0359 Gross per 24 hour  Intake 120 ml  Output 300 ml  Net -180 ml   Filed Weights   12/23/17 2019 12/24/17 1634  Weight: 60.3 kg (133 lb) 63.9 kg (140 lb 14 oz)   Exam  General: Well developed, well nourished, NAD, appears stated age  40: NCAT, mucous membranes moist. Poor dentition  Neck: Supple  Cardiovascular: S1 S2 auscultated, 2/6 SEM, RRR  Respiratory: Clear to auscultation bilaterally with equal chest rise  Abdomen: Soft, nontender, nondistended, + bowel sounds  Extremities: warm dry without cyanosis clubbing or edema  Neuro: AAOx3, nonfocal  Psych: Normal affect and demeanor, pleasant  Data Reviewed: I have personally reviewed following labs and imaging studies  CBC: Recent Labs  Lab 01/01/18 0521 01/02/18 0418  01/04/18 0644  01/04/18 1941 01/05/18 0531 01/06/18 0650 01/07/18 0525  WBC 2.7* 3.0*  --  3.8*  --  3.7* 4.1 3.1*  NEUTROABS 1.8 2.3  --   --   --   --   --   --   HGB 8.1* 7.0*   < > 7.5* 7.3* 8.2* 7.4* 7.3*  HCT 25.1* 21.1*   < > 23.2* 23.2* 25.4* 22.5* 22.5*  MCV 98.8 100.5*  --  97.9  --  99.6 97.0 96.6  PLT 107* 99*  --  118*  --  109* 93* 87*   < > = values in this interval not displayed.   Basic Metabolic Panel: Recent Labs  Lab 01/03/18 0548 01/04/18 0644 01/05/18 0531 01/06/18 0650 01/07/18 0525  NA 139 139 137 134* 137  K 3.9 3.5 3.2* 3.1* 3.4*  CL 112* 114* 104 103 107  CO2 18* 15* 20* 19* 20*  GLUCOSE 101* 90 115* 122* 93  BUN 10 5* 6 5* 6  CREATININE 1.92* 1.76* 1.69* 1.54* 1.33*  CALCIUM 8.7* 8.6* 8.6* 8.1* 8.4*   GFR: Estimated Creatinine Clearance: 44.7 mL/min (A) (by C-G formula based on SCr of 1.33 mg/dL (H)). Liver Function Tests: Recent Labs  Lab 01/01/18 0521 01/02/18 0418 01/03/18 0548 01/07/18 0525  AST 49* 44* 40 33  ALT 16 16 15  12*  ALKPHOS 119 98 95 95  BILITOT 4.9* 4.7* 4.6* 3.8*  PROT 7.2 6.7 7.3 6.5  ALBUMIN 2.9* 2.8* 2.7* 2.5*   No results for input(s): LIPASE, AMYLASE in the last 168 hours. Recent Labs  Lab 01/03/18 0548 01/05/18 0531  AMMONIA 50* 34   Coagulation Profile: No results for input(s): INR, PROTIME in the last 168 hours. Cardiac Enzymes: No results for input(s): CKTOTAL, CKMB, CKMBINDEX, TROPONINI in the last 168 hours. BNP (last 3 results) No results for input(s): PROBNP in the last 8760 hours. HbA1C: No results for input(s): HGBA1C in the last 72 hours. CBG: Recent Labs  Lab 01/03/18 0749 01/04/18 0805 01/05/18 0815 01/06/18 0754 01/07/18 0751  GLUCAP 90 106* 88 95 90   Lipid Profile: No results for input(s): CHOL, HDL, LDLCALC, TRIG, CHOLHDL, LDLDIRECT in the last 72 hours. Thyroid Function Tests: No results for input(s): TSH, T4TOTAL, FREET4, T3FREE, THYROIDAB in the last 72 hours. Anemia Panel: Recent Labs     01/05/18 0531  RETICCTPCT 4.1*   Urine analysis:    Component Value Date/Time   COLORURINE YELLOW 01/07/2018 0419   APPEARANCEUR CLEAR 01/07/2018 0419   LABSPEC 1.008 01/07/2018 0419   PHURINE 6.0 01/07/2018 0419   GLUCOSEU NEGATIVE 01/07/2018 0419   HGBUR NEGATIVE 01/07/2018 0419   BILIRUBINUR NEGATIVE 01/07/2018 0419   BILIRUBINUR negative 05/19/2016 1638   KETONESUR NEGATIVE 01/07/2018 0419   PROTEINUR NEGATIVE 01/07/2018 0419   UROBILINOGEN 0.2 05/19/2016 1638   UROBILINOGEN 0.2 08/17/2015 1853   NITRITE NEGATIVE 01/07/2018 0419   LEUKOCYTESUR NEGATIVE 01/07/2018 0419   Sepsis Labs: @LABRCNTIP (procalcitonin:4,lacticidven:4)  ) Recent Results (  from the past 240 hour(s))  Culture, blood (routine x 2)     Status: None   Collection Time: 01/01/18  4:32 PM  Result Value Ref Range Status   Specimen Description BLOOD RIGHT FOREARM  Final   Special Requests   Final    BOTTLES DRAWN AEROBIC AND ANAEROBIC Blood Culture adequate volume   Culture   Final    NO GROWTH 5 DAYS Performed at [facility], [address], [place], [place] [address]    Report Status 01/06/2018 FINAL  Final  Culture, blood (routine x 2)     Status: None   Collection Time: 01/01/18  4:43 PM  Result Value Ref Range Status   Specimen Description BLOOD LEFT WRIST  Final   Special Requests   Final    BOTTLES DRAWN AEROBIC AND ANAEROBIC Blood Culture adequate volume   Culture   Final    NO GROWTH 5 DAYS Performed at [facility], [place] [address] [place], [place] [address]    Report Status 01/06/2018 FINAL  Final  Urine Culture     Status: Abnormal   Collection Time: 01/01/18 10:32 PM  Result Value Ref Range Status   Specimen Description URINE, RANDOM  Final   Special Requests NONE  Final   Culture (A)  Final    <10,000 COLONIES/mL INSIGNIFICANT GROWTH Performed at [facility], [place] [address] [place], [place] [address]    Report Status 01/03/2018 FINAL  Final       Radiology Studies: No results found.   Scheduled Meds: . colestipol  1 g Oral BID  . folic acid  1 mg Oral Daily  . hydrocortisone  25 mg Rectal BID  . levothyroxine  75 mcg Oral QAC breakfast  . lipase/protease/amylase  72,000 Units Oral TID WC  . multivitamin with minerals  1 tablet Oral Daily  . nicotine  21 mg Transdermal Daily  . pantoprazole (PROTONIX) IV  40 mg Intravenous Q12H  . pregabalin  50 mg Oral BID  . thiamine  100 mg Oral Daily   Continuous Infusions: . sodium chloride    . cefTRIAXone (ROCEPHIN)  IV Stopped (01/07/18 1016)     LOS: 14 days   Time Spent in minutes   30 minutes  Ares Cardozo D.O. on 01/07/2018 at 12:09 PM  Between 7am to 7pm - Pager - [phone]  After 7pm go to [email or web address] - password [email or web address]  And look for the night coverage person covering for me after hours  [facility] Office  [phone]

## 2018-01-08 DIAGNOSIS — G6289 Other specified polyneuropathies: Secondary | ICD-10-CM

## 2018-01-08 DIAGNOSIS — K7581 Nonalcoholic steatohepatitis (NASH): Secondary | ICD-10-CM

## 2018-01-08 DIAGNOSIS — R162 Hepatomegaly with splenomegaly, not elsewhere classified: Secondary | ICD-10-CM

## 2018-01-08 LAB — URINE CULTURE

## 2018-01-08 LAB — GLUCOSE, CAPILLARY: Glucose-Capillary: 115 mg/dL — ABNORMAL HIGH (ref 65–99)

## 2018-01-08 LAB — BASIC METABOLIC PANEL
Anion gap: 10 (ref 5–15)
BUN: <5 mg/dL — ABNORMAL LOW (ref 6–20)
CHLORIDE: 105 mmol/L (ref 101–111)
CO2: 20 mmol/L — AB (ref 22–32)
CREATININE: 1.17 mg/dL — AB (ref 0.44–1.00)
Calcium: 8.4 mg/dL — ABNORMAL LOW (ref 8.9–10.3)
GFR calc Af Amer: >60 mL/min (ref >60)
GFR calc non Af Amer: 55 mL/min — ABNORMAL LOW (ref >60)
Glucose, Bld: 102 mg/dL — ABNORMAL HIGH (ref 65–99)
POTASSIUM: 3.7 mmol/L (ref 3.5–5.1)
Sodium: 135 mmol/L (ref 135–145)

## 2018-01-08 LAB — CBC
HEMATOCRIT: 22.7 % — AB (ref 36.0–46.0)
Hemoglobin: 7.6 g/dL — ABNORMAL LOW (ref 12.0–15.0)
MCH: 32.6 pg (ref 26.0–34.0)
MCHC: 33.5 g/dL (ref 30.0–36.0)
MCV: 97.4 fL (ref 78.0–100.0)
PLATELETS: 85 10*3/uL — AB (ref 150–400)
RBC: 2.33 MIL/uL — AB (ref 3.87–5.11)
WBC: 3.3 10*3/uL — AB (ref 4.0–10.5)

## 2018-01-08 LAB — MAGNESIUM: Magnesium: 1.1 mg/dL — ABNORMAL LOW (ref 1.7–2.4)

## 2018-01-08 MED ORDER — ADULT MULTIVITAMIN W/MINERALS CH: 1.0000 | ORAL_TABLET | Freq: Every day | ORAL | 0 refills | Status: DC

## 2018-01-08 MED ORDER — THIAMINE HCL 100 MG PO TABS: 100.0000 mg | ORAL_TABLET | Freq: Every day | ORAL | 0 refills | Status: DC

## 2018-01-08 MED ORDER — NICOTINE 21 MG/24HR TD PT24: 21.0000 mg | MEDICATED_PATCH | Freq: Every day | TRANSDERMAL | 0 refills | Status: DC

## 2018-01-08 MED ORDER — POTASSIUM CHLORIDE CRYS ER 20 MEQ PO TBCR
40.0000 meq | EXTENDED_RELEASE_TABLET | Freq: Once | ORAL | Status: AC
  Administered 2018-01-08: 40 meq via ORAL
  Filled 2018-01-08: qty 2

## 2018-01-08 MED ORDER — MAGNESIUM SULFATE 4 GM/100ML IV SOLN
4.0000 g | Freq: Once | INTRAVENOUS | Status: AC
  Administered 2018-01-08: 4 g via INTRAVENOUS
  Filled 2018-01-08 (×2): qty 100

## 2018-01-08 MED FILL — oxyCODONE HCL 5 MG TABS: 5 | 5 days supply | Qty: 20 | Fill #0

## 2018-01-08 MED FILL — COLESTIPOL HCL 1 GM TABLET: 1 | 15 days supply | Qty: 30 | Fill #0

## 2018-01-08 NOTE — Progress Notes (Signed)
Student nurse took patient's blood pressure it was 74/25. I rechecked manually and it was 100/56. Patient is fine and not symptomatic.

## 2018-01-08 NOTE — Progress Notes (Signed)
MD ordered IVPB Magnesium, it is running now. Then patient will be D/C.

## 2018-01-08 NOTE — Progress Notes (Signed)
Waiting on lab work results. MD still has D/C order in. Husband can not come get her until after he gets off work around 4.

## 2018-01-08 NOTE — Progress Notes (Signed)
MD notified of patient having 6 beats of Vtach. Patient is not symptomatic. Blood pressure is 73/63

## 2018-01-08 NOTE — Discharge Summary (Addendum)
Physician Discharge Summary  Jamie Allen EGB:[id number] DOB: 1971-02-05 DOA: 12/23/2017  PCP: Ladell Pier, MD  Admit date: 12/23/2017 Discharge date: 01/08/2018  Time spent: 45 minutes  Recommendations for Outpatient Follow-up:  Patient will be discharged to home.  Patient will need to follow up with primary care provider within one week of discharge, repeat CBC and BMP, magnesium.  Follow up with Dr. Megan Salon, Infectious disease. Follow up with Dr. Marin Olp, hematologist. Patient should continue medications as prescribed.  Patient should follow a regular diet.   Discharge Diagnoses:  Septic shock/persistant fever Macrocytic anemia GI bleed NSVT/hypomagnesemia   Metabolic acidosis Alcoholic cirrhosis Acute encephalopathy History of C. difficile enteritis Thrombocytopenia, chronic Vancomycin induced nephrotoxicity Peripheral neuropathy Hypothyroidism Abdominal/ RLQ/pelvic pain GERD  Discharge Condition: Stable  Diet recommendation: regular  Filed Weights   12/23/17 2019 12/24/17 1634  Weight: 60.3 kg (133 lb) 63.9 kg (140 lb 14 oz)    History of present illness:  On 12/24/2017 by Dr. Ivor Costa Jamie Allen is a 47 y.o. 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  Pt states that she has had 4 recurrent C. difficile colitis, and recently had a fecal transplant. She states that 2 weeks following the transplant, she diarrhea improved significantly. However for the past several days she has had more diarrhea again, which is nonbloody. She also has nausea and vomited several times. She has a right lower quadrant abdominal pain, which is constant, 10 out of 10 in severity, nonradiating. She  also has fever and chills. Patient does not have chest pain, sometimes has some mild shortness of breath, no cough, runny nose or sore throat. Pt states that she had rectal bleeding 2 days ago with a bright red blood. Currently patient does not have rectal bleeding today. No symptoms of UTI or unilateral weakness.  Pt had hypotension initally with blood pressure 81/41. Patient was given 2 L normal saline bolus. Blood transfusion was started. PCCM was consulted, and started with Levophed temporarily. Blood pressure improved to SBP 110-120, which is stable after stopped Levophed.  Hospital Course:  Septic shock/persistant fever -Presented with fever, tachycardia, tachypnea, hypotension -Upon admission, PCCM consulted and patient was started on Levophed temporarily however her blood pressures did improve. -Respiratory viral panel negative -Thought to be secondary to possible urinary tract infection on admission however urine culture was negative on 12/24/2017.  Repeat urine culture on 01/01/2018 showed 10,000 colonies, insignificant growth. -Patient was initially placed on vancomycin and Zosyn and then switched to Vantin.  She currently is on ceftriaxone -CT of the abdomen pelvis was negative for infection -Infectious disease consulted and appreciated, recommended repeating blood cultures-pending, UA/culture-insignificant growth, hepatitis A/B/C serologies-unremarkable, CMV serology <230, HIV testing unremarkable -Echocardiogram EF 60-65%, Mild MR, mildly dilated LA, moderate TR. PA peak pressure 86mHg -has been afebrile for over 24 hours -Discussed with Dr. CMegan Salon ID, work up has been negative thus far. Possibility of fever related to fecal transplant. Patient to follow up with him as an outpatient; he will arrange this appointment  Macrocytic anemia -Patient has received 4 units PRBC to date -Hematology, Dr.Ennever, consulted and appreciated.  -Status post bone marrow biopsy by interventional  radiology which showed Hypercellular bone marrow with trilineage hematopoiesis.  Peripheral blood showed pancytopenia. -Gastroenterology consulted and appreciated as there  was a report of bloody stool-felt that bleeding was due to hemorrhoids -hemoglobin  7.6 (baseline 7-8) -FOBT negative -Repeat CBC in one week  NSVT/hypomagnesemia  -patient has 7 beat run to NSVT today, and aysmptomatic -Magnesium 1.1, replaced with 4g IV -potassium 3.7 (replaced to a goal of 4) -repeat BMP and mag in one week -echocardiogram listed above  GI bleed -As above, there was a report of bloody stool, gastroenterology was consulted and felt that this was likely due to hemorrhoids -Hemoglobin currently 7.6 -Continue PPI  Metabolic acidosis -Possibly secondary to diarrhea, patient was on bicarb drip which has been discontinued  Alcoholic cirrhosis -Supposedly last drink was 1 week prior to admission -Patient did have an increased bilirubin however does appear to be trending downward -Hematology would like to evaluate for Wilson's disease, as serum ceruloplasmin level 30, serum copper 120. Pending 24hr urine copper. Patient may need liver biopsy -patient to follow up with Dr Marin Olp on discharge  Acute encephalopathy -Resolved, Appears to be alert and oriented at this time -Thought to be secondary to hepatic versus infection -As above patient was started on lactulose however this was held due to multiple bowel movements -Morphine and atarax were discontinued  History of C. difficile enteritis -C. difficile PCR negative -Patient received fecal transplant  Thrombocytopenia, chronic -Likely due to cirrhosis, currently stable  Vancomycin induced nephrotoxicity -Renal ultrasound shows no hydronephrosis -Creatinine improved, currently 1.17 -Repeat BMP in one week  Peripheral neuropathy and chronic pain -Continue Lyrica -patient has been receiving oxycodone PRN in the hospital, will discharge  her with a few days of oxycodone until she can follow up with her PCP. [place] controlled substance database reviewed, patient has not received pain medications since Dec 2018  Hypothyroidism -Continue Synthroid  Abdominal/ RLQ/pelvic pain -Resolved -CT scan was unremarkable -Ultrasound showed some fluid otherwise unremarkable -No longer complaining of abdominal or pelvic pain  GERD -Continue PPI  Consultants Hematology Gastroenterology Interventional radiology (for bone marrow biopsy) Infectious disease PCCM  Procedures  Bone marrow biopsy  Discharge Exam: Vitals:   01/08/18 0900 01/08/18 0954  BP:  (!) 85/56  Pulse: 83 88  Resp: (!) 30 (!) 22  Temp:    SpO2: 92% 92%   Patient seen and examined on day of discharge. Denies current chest pain, shortness of breath, abdominal pain, nausea, vomiting, constipation. Has some loose bowel movements. Feels tired but ready to go home.    General: Well developed, well nourished, NAD, appears stated age  66: NCAT,  mucous membranes moist. Poor dentition  Cardiovascular: S1 S2 auscultated, 1/6 SEM, RRR  Respiratory: Clear to auscultation bilaterally with equal chest rise, no wheezing  Abdomen: Soft, nontender, nondistended, + bowel sounds  Extremities: warm dry without cyanosis clubbing or edema  Neuro: AAOx3, nonfocal  Psych: Normal affect and demeanor, pleasant  Discharge Instructions Discharge Instructions    Diet - low sodium heart healthy   Complete by:  As directed    Discharge instructions   Complete by:  As directed    Increase activity slowly   Complete by:  As directed      Allergies as of 01/08/2018      Reactions   Iohexol Hives, Itching, Swelling   Vancomycin    Morphine And Related Itching, Other (See Comments)   Can tolerate with Benadryl      Medication List    STOP taking these medications   feeding supplement Liqd   gabapentin 300 MG capsule Commonly known as:  NEURONTIN  PEG-KCl-NaCl-NaSulf-Na Asc-C 140 g Solr Commonly known as:  PLENVU   promethazine 25 MG tablet Commonly known as:  PHENERGAN     TAKE these medications   colestipol 1 g tablet Commonly known as:  COLESTID Take 1 tablet (1 g total) by mouth 2 (two) times daily.   dicyclomine 10 MG capsule Commonly known as:  BENTYL Take 1 capsule (10 mg total) by mouth 3 (three) times daily before meals. What changed:    when to take this  reasons to take this   esomeprazole 20 MG capsule Commonly known as:  NEXIUM Take 20 mg by mouth daily at 12 noon.   folic acid 1 MG tablet Commonly known as:  FOLVITE Take 1 tablet (1 mg total) by mouth daily.   hydrOXYzine 10 MG tablet Commonly known as:  ATARAX/VISTARIL Take 1 tablet (10 mg total) by mouth 2 (two) times daily.   levothyroxine 75 MCG tablet Commonly known as:  SYNTHROID, LEVOTHROID Take 1 tablet (75 mcg total) by mouth daily before breakfast.   lipase/protease/amylase 36000 UNITS Cpep capsule Commonly known as:  CREON Take 2 capsules (72,000 Units total) by mouth 3 (three) times daily with meals.   multivitamin with minerals Tabs tablet Take 1 tablet by mouth daily. Start taking on:  01/09/2018   nicotine 21 mg/24hr patch Commonly known as:  NICODERM CQ - dosed in mg/24 hours Place 1 patch (21 mg total) onto the skin daily. Start taking on:  01/09/2018   oxyCODONE 5 MG immediate release tablet Commonly known as:  Oxy IR/ROXICODONE Take 1 tablet (5 mg total) by mouth every 6 (six) hours as needed for moderate pain.   pregabalin 50 MG capsule Commonly known as:  LYRICA Take 1 capsule (50 mg total) by mouth 3 (three) times daily.   thiamine 100 MG tablet Take 1 tablet (100 mg total) by mouth daily. Start taking on:  01/09/2018      Allergies  Allergen Reactions  . Iohexol Hives, Itching and Swelling  . Vancomycin   . Morphine And Related Itching and Other (See Comments)    Can tolerate with Benadryl   Follow-up  [place]. Call on 01/04/2018.   Why:  Please call on Monday to scheduled hospital follow up appointment for 01/13/2018 Contact information: [place] [phone]       Volanda Napoleon, MD Follow up in 1 week(s).   Specialty:  Oncology Contact information: [address] [place] [address] [phone]        Michel Bickers, MD. Schedule an appointment as soon as possible for a visit in 1 week(s).   Specialty:  Infectious Diseases Why:  Hospital follow up Contact information: [address] [place] [address] [phone]            The results of significant diagnostics from this hospitalization (including imaging, microbiology, ancillary and laboratory) are listed below for reference.    Significant Diagnostic Studies: Ct Abdomen Pelvis Wo Contrast  Result Date: 12/24/2017 CLINICAL DATA:  Status post fecal transplant with fever and chills EXAM: CT ABDOMEN AND PELVIS WITHOUT CONTRAST TECHNIQUE: Multidetector CT imaging of the abdomen and pelvis was performed following the standard protocol without IV contrast. COMPARISON:  09/25/2017, 09/20/2017, 06/28/2017, 04/28/2017, and multiple prior CT abdomen pelvis. FINDINGS: Lower chest: Visible lung bases demonstrate patchy dependent atelectasis. No acute consolidation or pleural effusion. Normal heart size. Hepatobiliary:  Cirrhotic appearing liver with nodular contour. Enlarged caudate lobe. Surgical clips in the gallbladder fossa. No biliary dilatation. Pancreas: Unremarkable. No pancreatic ductal dilatation or surrounding inflammatory changes. Spleen: Enlarged Adrenals/Urinary Tract: Adrenal glands are within normal limits. Mildly atrophic left kidney with cortical scarring present at the mid and upper pole. No hydronephrosis. The bladder is normal Stomach/Bowel: The stomach is nonenlarged. No dilated  small bowel. No significant colon wall thickening. Normal appendix. Vascular/Lymphatic: Mild aortic atherosclerosis. No aneurysmal dilatation. No significantly enlarged lymph nodes. Reproductive: Uterus and bilateral adnexa are unremarkable. Other: Negative for free air or free fluid. Stable mild soft tissue stranding/edema at the root of the mesentery. Stable minimal edema and soft tissue thickening in the right gutter. Musculoskeletal: No acute or significant osseous findings. IMPRESSION: 1. No acute interval changes since comparison CT. Mild soft tissue thickening and edema in the right gutter 2. Morphologic changes consistent with cirrhosis.  Splenomegaly. 3. Mildly atrophic left kidney with scarring. Electronically Signed   By: Donavan Foil M.D.   On: 12/24/2017 02:23   Dg Chest 2 View  Result Date: 12/23/2017 CLINICAL DATA:  Cough congestion and fever EXAM: CHEST - 2 VIEW COMPARISON:  09/20/2017 FINDINGS: The heart size and mediastinal contours are within normal limits. Linear scarring or atelectasis at the left base. Normal heart size. No pneumothorax. IMPRESSION: No active cardiopulmonary disease. Linear scarring or atelectasis at the left lung base. Electronically Signed   By: Donavan Foil M.D.   On: 12/23/2017 21:10   US Renal  Result Date: 12/26/2017 CLINICAL DATA:  Acute kidney injury EXAM: RENAL / URINARY TRACT ULTRASOUND COMPLETE COMPARISON:  None. FINDINGS: Right Kidney: Length: 12.8 cm. Echogenicity within normal limits. No mass or hydronephrosis visualized. Trace perinephric fluid adjacent to the upper pole. Left Kidney: Length: 9.8 cm. Atrophic upper pole, compatible with the appearance on CT of 12/24/2017. Echogenicity within normal limits. No mass or hydronephrosis visualized. Bladder: Decompressed by Foley catheter. Splenomegaly, also demonstrated on earlier CT. IMPRESSION: 1. No acute findings. No hydronephrosis. Trace perinephric fluid adjacent to the upper pole of the right kidney is  of uncertain significance. 2. Partially atrophic (upper pole) left kidney, as seen on multiple prior abdomen CTs. 3. Splenomegaly, as also described on previous CT (likely related to previously described cirrhosis). Electronically Signed   By: Franki Cabot M.D.   On: 12/26/2017 15:22   US Abdomen Limited  Result Date: 01/03/2018 CLINICAL DATA:  Abdominal distension EXAM: LIMITED ABDOMEN ULTRASOUND FOR ASCITES TECHNIQUE: Limited ultrasound survey for ascites was performed in all four abdominal quadrants. COMPARISON:  December 28, 2017 abdominal examination and CT abdomen and pelvis December 24, 2017 FINDINGS: There is a rather minimal amount of ascites seen in the left upper and lower quadrant regions. Multiple loops of peristalsing bowel are noted throughout the abdomen pelvis. IMPRESSION: Rather minimal ascites on the left. Electronically Signed   By: Lowella Grip III M.D.   On: 01/03/2018 21:48   US Abdomen Limited  Result Date: 12/28/2017 CLINICAL DATA:  Abdominal distension EXAM: LIMITED ABDOMEN ULTRASOUND FOR ASCITES TECHNIQUE: Limited ultrasound survey for ascites was performed in all four abdominal quadrants. COMPARISON:  None. FINDINGS: Minimal free fluid is noted surrounding the falciform ligament. No sizable amount of ascites is noted. IMPRESSION: Minimal free fluid in the abdomen. Electronically Signed   By: Inez Catalina M.D.   On: 12/28/2017 19:44   Dg Chest Port 1 View  Result Date: 01/01/2018 CLINICAL DATA:  Fever today with dyspnea EXAM: PORTABLE CHEST 1  VIEW COMPARISON:  12/23/2017 FINDINGS: The heart size and mediastinal contours are within normal limits. Atelectasis and/or scarring is seen at the left lung base. No pulmonary consolidation nor overt pulmonary edema. The visualized skeletal structures are unremarkable. IMPRESSION: No active disease. Electronically Signed   By: Ashley Royalty M.D.   On: 01/01/2018 19:16   Dg Abd Portable 1v  Result Date: 12/28/2017 CLINICAL DATA:   Distended abdomen EXAM: PORTABLE ABDOMEN - 1 VIEW COMPARISON:  CT 12/24/2017 FINDINGS: Normal bowel gas pattern. Surgical clips in the gallbladder fossa. Foley catheter. No renal calculi. IMPRESSION: Negative. Electronically Signed   By: Franchot Gallo M.D.   On: 12/28/2017 12:54   Ct Bone Marrow Biopsy & Aspiration  Result Date: 12/30/2017 INDICATION: Cirrhosis, chronic anemia EXAM: CT GUIDED RIGHT ILIAC BONE MARROW ASPIRATION AND CORE BIOPSY Date:  12/30/2017 12/30/2017 11:40 am Radiologist:  Jerilynn Mages. Daryll Brod, MD Guidance:  CT FLUOROSCOPY TIME:  Fluoroscopy Time: NONE. MEDICATIONS: None. ANESTHESIA/SEDATION: 2.0 mg IV Versed; 150 mcg IV Fentanyl Moderate Sedation Time:  9 minutes The patient was continuously monitored during the procedure by the interventional radiology nurse under my direct supervision. CONTRAST:  None. COMPLICATIONS: None PROCEDURE: Informed consent was obtained from the patient following explanation of the procedure, risks, benefits and alternatives. The patient understands, agrees and consents for the procedure. All questions were addressed. A time out was performed. The patient was positioned prone and non-contrast localization CT was performed of the pelvis to demonstrate the iliac marrow spaces. Maximal barrier sterile technique utilized including caps, mask, sterile gowns, sterile gloves, large sterile drape, hand hygiene, and Betadine prep. Under sterile conditions and local anesthesia, an 11 gauge coaxial bone biopsy needle was advanced into the right iliac marrow space. Needle position was confirmed with CT imaging. Initially, bone marrow aspiration was performed. Next, the 11 gauge outer cannula was utilized to obtain a right iliac bone marrow core biopsy. Needle was removed. Hemostasis was obtained with compression. The patient tolerated the procedure well. Samples were prepared with the cytotechnologist. No immediate complications. IMPRESSION: CT guided right iliac bone marrow  aspiration and core biopsy. Electronically Signed   By: Jerilynn Mages.  Shick M.D.   On: 12/30/2017 11:43   US Pelvic Complete With Transvaginal  Result Date: 12/27/2017 CLINICAL DATA:  Bilateral pelvic pain for 1 year EXAM: TRANSABDOMINAL AND TRANSVAGINAL ULTRASOUND OF PELVIS TECHNIQUE: Both transabdominal and transvaginal ultrasound examinations of the pelvis were performed. Transabdominal technique was performed for global imaging of the pelvis including uterus, ovaries, adnexal regions, and pelvic cul-de-sac. It was necessary to proceed with endovaginal exam following the transabdominal exam to visualize the endometrial complex and adnexal structures. COMPARISON:  None FINDINGS: Uterus Measurements: 5.4 x 3.1 x 3.6 cm. No fibroids or other mass visualized. Endometrium Thickness: 4 mm.  No focal abnormality visualized. Right ovary Not seen. No mass appreciated in the right adnexa. Small amount of simple appearing free fluid in the right adnexa, likely physiologic. Left ovary Measurements: 2.7 x 1.6 x 1.7 cm. Normal appearance/no adnexal mass. Trace free fluid in the left adnexa, also likely physiologic. Other findings No abnormal free fluid. IMPRESSION: 1. Overall, no acute or significant findings. 2. Uterus appears normal. 3. Left ovary appears normal. 4. Right ovary is not seen but there is no mass or other significant abnormality in the right adnexal region. 5. Small amount of simple appearing free fluid in the adnexal regions, right greater than left, likely physiologic. Electronically Signed   By: Franki Cabot M.D.   On:  12/27/2017 10:48    Microbiology: Recent Results (from the past 240 hour(s))  Culture, blood (routine x 2)     Status: None   Collection Time: 01/01/18  4:32 PM  Result Value Ref Range Status   Specimen Description BLOOD RIGHT FOREARM  Final   Special Requests   Final    BOTTLES DRAWN AEROBIC AND ANAEROBIC Blood Culture adequate volume   Culture   Final    NO GROWTH 5 DAYS Performed at  [facility], [address] [place], [place] [address]    Report Status 01/06/2018 FINAL  Final  Culture, blood (routine x 2)     Status: None   Collection Time: 01/01/18  4:43 PM  Result Value Ref Range Status   Specimen Description BLOOD LEFT WRIST  Final   Special Requests   Final    BOTTLES DRAWN AEROBIC AND ANAEROBIC Blood Culture adequate volume   Culture   Final    NO GROWTH 5 DAYS Performed at [facility], [place] [address], [place], [place] [address]    Report Status 01/06/2018 FINAL  Final  Urine Culture     Status: Abnormal   Collection Time: 01/01/18 10:32 PM  Result Value Ref Range Status   Specimen Description URINE, RANDOM  Final   Special Requests NONE  Final   Culture (A)  Final    <10,000 COLONIES/mL INSIGNIFICANT GROWTH Performed at [facility], [place] [address] [place], [place] [address]    Report Status 01/03/2018 FINAL  Final  Culture, blood (routine x 2)     Status: None (Preliminary result)   Collection Time: 01/06/18  4:45 PM  Result Value Ref Range Status   Specimen Description BLOOD RIGHT ANTECUBITAL  Final   Special Requests   Final    BOTTLES DRAWN AEROBIC AND ANAEROBIC Blood Culture adequate volume   Culture   Final    NO GROWTH < 24 HOURS Performed at [facility], [place] [address], [place], [place] [address]    Report Status PENDING  Incomplete  Culture, blood (routine x 2)     Status: None (Preliminary result)   Collection Time: 01/06/18  5:00 PM  Result Value Ref Range Status   Specimen Description BLOOD RIGHT FOREARM  Final   Special Requests   Final    BOTTLES DRAWN AEROBIC AND ANAEROBIC Blood Culture adequate volume   Culture   Final    NO GROWTH < 24 HOURS Performed at [facility], [place] [address], [place], [place] [address]    Report Status PENDING  Incomplete  Culture, Urine     Status: Abnormal   Collection Time: 01/07/18  3:58 AM  Result Value Ref Range Status   Specimen Description URINE, CLEAN  CATCH  Final   Special Requests NONE  Final   Culture (A)  Final    <10,000 COLONIES/mL INSIGNIFICANT GROWTH Performed at [facility] [address] [place], [place] [address]    Report Status 01/08/2018 FINAL  Final     Labs: Basic Metabolic Panel: Recent Labs  Lab 01/03/18 0548 01/04/18 0644 01/05/18 0531 01/06/18 0650 01/07/18 0525  NA 139 139 137 134* 137  K 3.9 3.5 3.2* 3.1* 3.4*  CL 112* 114* 104 103 107  CO2 18* 15* 20* 19* 20*  GLUCOSE 101* 90 115* 122* 93  BUN 10 5* 6 5* 6  CREATININE 1.92* 1.76* 1.69* 1.54* 1.33*  CALCIUM 8.7* 8.6* 8.6* 8.1* 8.4*  Liver Function Tests: Recent Labs  Lab 01/02/18 0418 01/03/18 0548 01/07/18 0525  AST 44* 40 33  ALT 16 15 12*  ALKPHOS 98 95 95  BILITOT 4.7* 4.6* 3.8*  PROT 6.7 7.3 6.5  ALBUMIN 2.8* 2.7* 2.5*   No results for input(s): LIPASE, AMYLASE in the last 168 hours. Recent Labs  Lab 01/03/18 0548 01/05/18 0531  AMMONIA 50* 34   CBC: Recent Labs  Lab 01/02/18 0418  01/04/18 0644 01/04/18 1941 01/05/18 0531 01/06/18 0650 01/07/18 0525  WBC 3.0*  --  3.8*  --  3.7* 4.1 3.1*  NEUTROABS 2.3  --   --   --   --   --   --   HGB 7.0*   < > 7.5* 7.3* 8.2* 7.4* 7.3*  HCT 21.1*   < > 23.2* 23.2* 25.4* 22.5* 22.5*  MCV 100.5*  --  97.9  --  99.6 97.0 96.6  PLT 99*  --  118*  --  109* 93* 87*   < > = values in this interval not displayed.   Cardiac Enzymes: No results for input(s): CKTOTAL, CKMB, CKMBINDEX, TROPONINI in the last 168 hours. BNP: BNP (last 3 results) No results for input(s): BNP in the last 8760 hours.  ProBNP (last 3 results) No results for input(s): PROBNP in the last 8760 hours.  CBG: Recent Labs  Lab 01/04/18 0805 01/05/18 0815 01/06/18 0754 01/07/18 0751 01/08/18 0839  GLUCAP 106* 88 95 90 115*       Signed:  Lenetta Piche  [facility] 01/08/2018, 10:08 AM

## 2018-01-08 NOTE — Progress Notes (Signed)
Patient ID: Jamie Allen, female   DOB: [birth year], 47 y.o.   MRN: [id number]         [facility]  Date of Admission:  12/23/2017    Total days of antibiotics 15        Day 8 ceftriaxone         ASSESSMENT: The cause of her recent febrile illness remains unclear.  I favor stopping ceftriaxone as we do not have any evidence of bacterial infection and this is likely to trigger a relapse of her C. difficile colitis.  PLAN: 1. Discontinue ceftriaxone 2. Recommend discharge home 3. I will arrange follow-up in our clinic  Principal Problem:   Fever of unknown origin Active Problems:   Recurrent Clostridium difficile diarrhea   Septic shock ([place])   Acute kidney injury ([place])   Acute on chronic anemia   Anxiety and depression   Hypothyroidism   Hepatosplenomegaly   GERD (gastroesophageal reflux disease)   Obesity (BMI 30-39.9)   Anxiety   Pancytopenia (HCC)   Peripheral neuropathy   Nonalcoholic steatohepatitis ([facility])   Hypertension   Tobacco abuse   Macrocytic anemia   Thrombocytopenia (H[facility])   Alcohol abuse   Internal bleeding hemorrhoids   Scheduled Meds: . colestipol  1 g Oral BID  . folic acid  1 mg Oral Daily  . hydrocortisone  25 mg Rectal BID  . levothyroxine  75 mcg Oral QAC breakfast  . lipase/protease/amylase  72,000 Units Oral TID WC  . multivitamin with minerals  1 tablet Oral Daily  . nicotine  21 mg Transdermal Daily  . pantoprazole  40 mg Oral BID  . pregabalin  50 mg Oral BID  . thiamine  100 mg Oral Daily   Continuous Infusions: . sodium chloride    . cefTRIAXone (ROCEPHIN)  IV Stopped (01/08/18 0917)   PRN Meds:.acetaminophen, dicyclomine, diphenhydrAMINE, ondansetron (ZOFRAN) IV, oxyCODONE, zolpidem   SUBJECTIVE: She is feeling much better wants to go home.  Review of Systems: Review of Systems  Constitutional: Negative for chills, diaphoresis, fever and malaise/fatigue.  HENT: Negative for congestion and  sore throat.   Respiratory: Negative for cough, sputum production and shortness of breath.   Cardiovascular: Negative for chest pain.  Gastrointestinal: Negative for abdominal pain, diarrhea, nausea and vomiting.  Genitourinary: Negative for dysuria.  Skin: Negative for itching and rash.    Allergies  Allergen Reactions  . Iohexol Hives, Itching and Swelling  . Vancomycin   . Morphine And Related Itching and Other (See Comments)    Can tolerate with Benadryl    OBJECTIVE: Vitals:   01/08/18 0440 01/08/18 0448 01/08/18 0548 01/08/18 0850  BP:  126/75 106/68 (!) 86/61  Pulse:  79 80 85  Resp:  (!) 36 19 (!) 21  Temp: 97.9 F (36.6 C)     TempSrc:      SpO2:  (!) 89% (!) 87% 93%  Weight:      Height:       Body mass index is 26.62 kg/m.  Physical Exam  Constitutional: She is oriented to person, place, and time.  She looks much better this morning.  She is sitting up in bed.  She is in good spirits.  HENT:  Mouth/Throat: No oropharyngeal exudate.  Cardiovascular: Normal rate and regular rhythm.  Murmur heard. 1/6 systolic murmur  Pulmonary/Chest: Effort normal. She has no wheezes. She has no rales.  Abdominal: Soft. She exhibits no distension. There is no tenderness.  Neurological: She  is alert and oriented to person, place, and time.  Skin: No rash noted.  Psychiatric: Mood and affect normal.    Lab Results Lab Results  Component Value Date   WBC 3.1 (L) 01/07/2018   HGB 7.3 (L) 01/07/2018   HCT 22.5 (L) 01/07/2018   MCV 96.6 01/07/2018   PLT 87 (L) 01/07/2018    Lab Results  Component Value Date   CREATININE 1.33 (H) 01/07/2018   BUN 6 01/07/2018   NA 137 01/07/2018   K 3.4 (L) 01/07/2018   CL 107 01/07/2018   CO2 20 (L) 01/07/2018    Lab Results  Component Value Date   ALT 12 (L) 01/07/2018   AST 33 01/07/2018   ALKPHOS 95 01/07/2018   BILITOT 3.8 (H) 01/07/2018     Microbiology: Recent Results (from the past 240 hour(s))  Culture, blood  (routine x 2)     Status: None   Collection Time: 01/01/18  4:32 PM  Result Value Ref Range Status   Specimen Description BLOOD RIGHT FOREARM  Final   Special Requests   Final    BOTTLES DRAWN AEROBIC AND ANAEROBIC Blood Culture adequate volume   Culture   Final    NO GROWTH 5 DAYS Performed at [facility], [place] [address], [place], [place] [address]    Report Status 01/06/2018 FINAL  Final  Culture, blood (routine x 2)     Status: None   Collection Time: 01/01/18  4:43 PM  Result Value Ref Range Status   Specimen Description BLOOD LEFT WRIST  Final   Special Requests   Final    BOTTLES DRAWN AEROBIC AND ANAEROBIC Blood Culture adequate volume   Culture   Final    NO GROWTH 5 DAYS Performed at [facility], [place] [address] [place], [place] [address]    Report Status 01/06/2018 FINAL  Final  Urine Culture     Status: Abnormal   Collection Time: 01/01/18 10:32 PM  Result Value Ref Range Status   Specimen Description URINE, RANDOM  Final   Special Requests NONE  Final   Culture (A)  Final    <10,000 COLONIES/mL INSIGNIFICANT GROWTH Performed at [facility], [place] [address] [place], [place] [address]    Report Status 01/03/2018 FINAL  Final  Culture, blood (routine x 2)     Status: None (Preliminary result)   Collection Time: 01/06/18  4:45 PM  Result Value Ref Range Status   Specimen Description BLOOD RIGHT ANTECUBITAL  Final   Special Requests   Final    BOTTLES DRAWN AEROBIC AND ANAEROBIC Blood Culture adequate volume   Culture   Final    NO GROWTH < 24 HOURS Performed at [facility], [place] [address], [place], [place] [address]    Report Status PENDING  Incomplete  Culture, blood (routine x 2)     Status: None (Preliminary result)   Collection Time: 01/06/18  5:00 PM  Result Value Ref Range Status   Specimen Description BLOOD RIGHT FOREARM  Final   Special Requests   Final    BOTTLES DRAWN AEROBIC AND ANAEROBIC Blood Culture adequate volume    Culture   Final    NO GROWTH < 24 HOURS Performed at [facility], [place] [address] [place], [place] [address]    Report Status PENDING  Incomplete  Culture, Urine     Status: Abnormal   Collection Time: 01/07/18  3:58 AM  Result Value Ref  Range Status   Specimen Description URINE, CLEAN CATCH  Final   Special Requests NONE  Final   Culture (A)  Final    <10,000 COLONIES/mL INSIGNIFICANT GROWTH Performed at [facility], [address], [place], [place] [address]    Report Status 01/08/2018 FINAL  Final    Michel Bickers, MD [place] [phone] pager   [phone] cell 01/08/2018, 9:47 AM

## 2018-01-08 NOTE — Progress Notes (Signed)
MD notified of patient's heart rhythm skipping beat, respirations in 20s-30s, and desatting. MD stated that her echo was done yesterday and everything looked good.

## 2018-01-08 NOTE — Progress Notes (Signed)
Physical Therapy Treatment [name] MRN: [id number] DOB: 08-30-1971 Today's Date: 01/08/2018    History of Present Illness Jamie Allen is a 47 y.o. 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 She presents with fevers/chills/nausea and vomiting concerning for sepsis with shock. Also with significant anemia of unknown cause. S/p 3 units of PRBC as of 3/11, and Hgb 6.9    PT Comments    Patient received sitting on the side of the bed with RN present, very pleasant but declining gait in the hallway or practice of HEP, prefers to simply verbally/visually review exercises today. Demonstrated HEP program for patient this morning, with patient giving verbal understanding of all exercises and education today including progressive walking program as tolerated at home. She was left sitting at EOB with RN present and attending.     Follow Up Recommendations  Outpatient PT     Equipment Recommendations  None recommended by PT    Recommendations for Other Services       Precautions / Restrictions Precautions Precautions: Other (comment) Precaution Comments: Hypotensive, watch BP Restrictions Weight Bearing Restrictions: No    Mobility  Bed Mobility               General bed mobility comments: declined mobility today   Transfers                 General transfer comment: declined mobility today   Ambulation/Gait             General Gait Details: declined mobility today    Stairs            Wheelchair Mobility    Modified Rankin (Stroke Patients Only)       Balance Overall balance assessment: Mild deficits observed, not formally tested                                          Cognition Arousal/Alertness:  Awake/alert Behavior During Therapy: WFL for tasks assessed/performed Overall Cognitive Status: Within Functional Limits for tasks assessed                                        Exercises      General Comments        Pertinent Vitals/Pain Pain Assessment: 0-10 Pain Score: 8  Pain Location: right side of trunk  Pain Descriptors / Indicators: Sharp;Sore Pain Intervention(s): Monitored during session;Limited activity within patient's tolerance    Home Living                      Prior Function            PT Goals (current goals can now be found in the care plan section) Acute Rehab PT Goals Patient Stated Goal: get better and get home PT Goal Formulation: With patient Time For Goal Achievement: 01/12/18 Potential to Achieve Goals: Good Progress towards PT goals: Progressing toward goals    Frequency    Min 3X/week      PT Plan Current plan remains appropriate    Co-evaluation  AM-PAC PT "6 Clicks" Daily Activity  Outcome Measure  Difficulty turning over in bed (including adjusting bedclothes, sheets and blankets)?: None Difficulty moving from lying on back to sitting on the side of the bed? : None Difficulty sitting down on and standing up from a chair with arms (e.g., wheelchair, bedside commode, etc,.)?: None Help needed moving to and from a bed to chair (including a wheelchair)?: None Help needed walking in hospital room?: None Help needed climbing 3-5 steps with a railing? : A Little 6 Click Score: 23    End of Session   Activity Tolerance: Patient tolerated treatment well Patient left: in bed;with call bell/phone within reach;with nursing/sitter in room   PT Visit Diagnosis: Other abnormalities of gait and mobility (R26.89);Dizziness and giddiness (R42)     Time: 1836-7255 PT Time Calculation (min) (ACUTE ONLY): 10 min  Charges:  $Self Care/Home Management: 8-22                    G Codes:        Deniece Ree PT, DPT, CBIS  Supplemental Physical Therapist [place]   Pager [phone]

## 2018-01-08 NOTE — Progress Notes (Signed)
[name] to be D/C'd Home per MD order.  Discussed with the patient and all questions fully answered.  VSS, Skin clean, dry and intact without evidence of skin break down, no evidence of skin tears noted. IV catheter discontinued intact. Site without signs and symptoms of complications. Dressing and pressure applied.  An After Visit Summary was printed and given to the patient. Patient received prescription.  D/c education completed with patient/family including follow up instructions, medication list, d/c activities limitations if indicated, with other d/c instructions as indicated by MD - patient able to verbalize understanding, all questions fully answered.   Patient instructed to return to ED, call 911, or call MD for any changes in condition.   Patient escorted via [place], and D/C home via private auto.  Holley Raring 01/08/2018 3:46 PM

## 2018-01-11 LAB — CULTURE, BLOOD (ROUTINE X 2)
CULTURE: NO GROWTH
Culture: NO GROWTH
SPECIAL REQUESTS: ADEQUATE
Special Requests: ADEQUATE

## 2018-01-22 ENCOUNTER — Ambulatory Visit: Payer: Self-pay | Admitting: Neurology

## 2018-01-22 ENCOUNTER — Emergency Department (HOSPITAL_COMMUNITY): Payer: Medicaid Other

## 2018-01-22 ENCOUNTER — Encounter (HOSPITAL_COMMUNITY): Payer: Self-pay | Admitting: Emergency Medicine

## 2018-01-22 ENCOUNTER — Other Ambulatory Visit: Payer: Self-pay

## 2018-01-22 ENCOUNTER — Inpatient Hospital Stay (HOSPITAL_COMMUNITY)
Admission: EM | Admit: 2018-01-22 | Discharge: 2018-01-26 | DRG: 809 | Disposition: A | Discharge status: Home / Self Care | Payer: Medicaid Other | Attending: Internal Medicine | Admitting: Internal Medicine

## 2018-01-22 DIAGNOSIS — N951 Menopausal and female climacteric states: Secondary | ICD-10-CM | POA: Diagnosis present

## 2018-01-22 DIAGNOSIS — E039 Hypothyroidism, unspecified: Secondary | ICD-10-CM | POA: Diagnosis present

## 2018-01-22 DIAGNOSIS — R112 Nausea with vomiting, unspecified: Secondary | ICD-10-CM

## 2018-01-22 DIAGNOSIS — Z8719 Personal history of other diseases of the digestive system: Secondary | ICD-10-CM

## 2018-01-22 DIAGNOSIS — A0472 Enterocolitis due to Clostridium difficile, not specified as recurrent: Secondary | ICD-10-CM

## 2018-01-22 DIAGNOSIS — D849 Immunodeficiency, unspecified: Secondary | ICD-10-CM | POA: Diagnosis present

## 2018-01-22 DIAGNOSIS — B952 Enterococcus as the cause of diseases classified elsewhere: Secondary | ICD-10-CM | POA: Diagnosis present

## 2018-01-22 DIAGNOSIS — A0471 Enterocolitis due to Clostridium difficile, recurrent: Secondary | ICD-10-CM

## 2018-01-22 DIAGNOSIS — K7030 Alcoholic cirrhosis of liver without ascites: Secondary | ICD-10-CM

## 2018-01-22 DIAGNOSIS — K7581 Nonalcoholic steatohepatitis (NASH): Secondary | ICD-10-CM | POA: Diagnosis present

## 2018-01-22 DIAGNOSIS — Z720 Tobacco use: Secondary | ICD-10-CM | POA: Diagnosis present

## 2018-01-22 DIAGNOSIS — E860 Dehydration: Secondary | ICD-10-CM | POA: Diagnosis present

## 2018-01-22 DIAGNOSIS — K7460 Unspecified cirrhosis of liver: Secondary | ICD-10-CM

## 2018-01-22 DIAGNOSIS — R197 Diarrhea, unspecified: Secondary | ICD-10-CM

## 2018-01-22 DIAGNOSIS — Z803 Family history of malignant neoplasm of breast: Secondary | ICD-10-CM

## 2018-01-22 DIAGNOSIS — R509 Fever, unspecified: Secondary | ICD-10-CM

## 2018-01-22 DIAGNOSIS — Z800 Family history of malignant neoplasm of digestive organs: Secondary | ICD-10-CM

## 2018-01-22 DIAGNOSIS — Z79890 Hormone replacement therapy (postmenopausal): Secondary | ICD-10-CM | POA: Diagnosis not present

## 2018-01-22 DIAGNOSIS — Z9049 Acquired absence of other specified parts of digestive tract: Secondary | ICD-10-CM

## 2018-01-22 DIAGNOSIS — R140 Abdominal distension (gaseous): Secondary | ICD-10-CM

## 2018-01-22 DIAGNOSIS — N390 Urinary tract infection, site not specified: Secondary | ICD-10-CM | POA: Diagnosis present

## 2018-01-22 DIAGNOSIS — I10 Essential (primary) hypertension: Secondary | ICD-10-CM | POA: Diagnosis present

## 2018-01-22 DIAGNOSIS — D61818 Other pancytopenia: Secondary | ICD-10-CM | POA: Diagnosis present

## 2018-01-22 DIAGNOSIS — D638 Anemia in other chronic diseases classified elsewhere: Secondary | ICD-10-CM | POA: Diagnosis present

## 2018-01-22 DIAGNOSIS — D696 Thrombocytopenia, unspecified: Secondary | ICD-10-CM | POA: Diagnosis not present

## 2018-01-22 DIAGNOSIS — R1011 Right upper quadrant pain: Secondary | ICD-10-CM | POA: Diagnosis not present

## 2018-01-22 DIAGNOSIS — Z9889 Other specified postprocedural states: Secondary | ICD-10-CM | POA: Diagnosis not present

## 2018-01-22 DIAGNOSIS — R822 Biliuria: Secondary | ICD-10-CM | POA: Diagnosis not present

## 2018-01-22 DIAGNOSIS — K3189 Other diseases of stomach and duodenum: Secondary | ICD-10-CM | POA: Diagnosis present

## 2018-01-22 DIAGNOSIS — D594 Other nonautoimmune hemolytic anemias: Secondary | ICD-10-CM | POA: Diagnosis present

## 2018-01-22 DIAGNOSIS — R1012 Left upper quadrant pain: Secondary | ICD-10-CM | POA: Diagnosis not present

## 2018-01-22 DIAGNOSIS — F17210 Nicotine dependence, cigarettes, uncomplicated: Secondary | ICD-10-CM | POA: Diagnosis present

## 2018-01-22 DIAGNOSIS — D649 Anemia, unspecified: Secondary | ICD-10-CM | POA: Diagnosis present

## 2018-01-22 DIAGNOSIS — F329 Major depressive disorder, single episode, unspecified: Secondary | ICD-10-CM | POA: Diagnosis present

## 2018-01-22 DIAGNOSIS — K7469 Other cirrhosis of liver: Secondary | ICD-10-CM | POA: Diagnosis present

## 2018-01-22 DIAGNOSIS — Z9289 Personal history of other medical treatment: Secondary | ICD-10-CM | POA: Diagnosis not present

## 2018-01-22 DIAGNOSIS — B370 Candidal stomatitis: Secondary | ICD-10-CM | POA: Diagnosis present

## 2018-01-22 DIAGNOSIS — D589 Hereditary hemolytic anemia, unspecified: Secondary | ICD-10-CM | POA: Diagnosis present

## 2018-01-22 DIAGNOSIS — F4310 Post-traumatic stress disorder, unspecified: Secondary | ICD-10-CM

## 2018-01-22 DIAGNOSIS — K529 Noninfective gastroenteritis and colitis, unspecified: Secondary | ICD-10-CM

## 2018-01-22 DIAGNOSIS — F419 Anxiety disorder, unspecified: Secondary | ICD-10-CM | POA: Diagnosis present

## 2018-01-22 DIAGNOSIS — R040 Epistaxis: Secondary | ICD-10-CM

## 2018-01-22 DIAGNOSIS — R161 Splenomegaly, not elsewhere classified: Secondary | ICD-10-CM | POA: Diagnosis not present

## 2018-01-22 DIAGNOSIS — R58 Hemorrhage, not elsewhere classified: Secondary | ICD-10-CM

## 2018-01-22 DIAGNOSIS — K766 Portal hypertension: Secondary | ICD-10-CM | POA: Diagnosis present

## 2018-01-22 DIAGNOSIS — K219 Gastro-esophageal reflux disease without esophagitis: Secondary | ICD-10-CM | POA: Diagnosis present

## 2018-01-22 DIAGNOSIS — D631 Anemia in chronic kidney disease: Secondary | ICD-10-CM | POA: Diagnosis not present

## 2018-01-22 DIAGNOSIS — D732 Chronic congestive splenomegaly: Secondary | ICD-10-CM

## 2018-01-22 DIAGNOSIS — D593 Hemolytic-uremic syndrome: Secondary | ICD-10-CM | POA: Diagnosis present

## 2018-01-22 DIAGNOSIS — E669 Obesity, unspecified: Secondary | ICD-10-CM | POA: Diagnosis present

## 2018-01-22 DIAGNOSIS — D588 Other specified hereditary hemolytic anemias: Secondary | ICD-10-CM | POA: Diagnosis not present

## 2018-01-22 DIAGNOSIS — N189 Chronic kidney disease, unspecified: Secondary | ICD-10-CM | POA: Diagnosis not present

## 2018-01-22 DIAGNOSIS — D72819 Decreased white blood cell count, unspecified: Secondary | ICD-10-CM | POA: Diagnosis not present

## 2018-01-22 DIAGNOSIS — B379 Candidiasis, unspecified: Secondary | ICD-10-CM | POA: Diagnosis not present

## 2018-01-22 LAB — CBC WITH DIFFERENTIAL/PLATELET
BASOS ABS: 0 10*3/uL (ref 0.0–0.1)
Basophils Relative: 1 %
Eosinophils Absolute: 0.1 10*3/uL (ref 0.0–0.7)
Eosinophils Relative: 2 %
HEMATOCRIT: 24 % — AB (ref 36.0–46.0)
HEMOGLOBIN: 8.1 g/dL — AB (ref 12.0–15.0)
LYMPHS ABS: 0.7 10*3/uL (ref 0.7–4.0)
LYMPHS PCT: 16 %
MCH: 34.6 pg — ABNORMAL HIGH (ref 26.0–34.0)
MCHC: 33.8 g/dL (ref 30.0–36.0)
MCV: 102.6 fL — ABNORMAL HIGH (ref 78.0–100.0)
Monocytes Absolute: 0.2 10*3/uL (ref 0.1–1.0)
Monocytes Relative: 5 %
NEUTROS PCT: 76 %
Neutro Abs: 3.6 10*3/uL (ref 1.7–7.7)
Platelets: 113 10*3/uL — ABNORMAL LOW (ref 150–400)
RBC: 2.34 MIL/uL — AB (ref 3.87–5.11)
RDW: 24.1 % — AB (ref 11.5–15.5)
WBC: 4.6 10*3/uL (ref 4.0–10.5)

## 2018-01-22 LAB — URINALYSIS, ROUTINE W REFLEX MICROSCOPIC
Bilirubin Urine: NEGATIVE
Glucose, UA: NEGATIVE mg/dL
Hgb urine dipstick: NEGATIVE
Ketones, ur: NEGATIVE mg/dL
Leukocytes, UA: NEGATIVE
Nitrite: NEGATIVE
PROTEIN: NEGATIVE mg/dL
Specific Gravity, Urine: 1.004 — ABNORMAL LOW (ref 1.005–1.030)
pH: 8 (ref 5.0–8.0)

## 2018-01-22 LAB — I-STAT CG4 LACTIC ACID, ED
Lactic Acid, Venous: 2.15 mmol/L (ref 0.5–1.9)
Lactic Acid, Venous: 1.07 mmol/L (ref 0.5–1.9)

## 2018-01-22 LAB — COMPREHENSIVE METABOLIC PANEL
ALT: 18 U/L (ref 14–54)
AST: 56 U/L — AB (ref 15–41)
Albumin: 3.1 g/dL — ABNORMAL LOW (ref 3.5–5.0)
Alkaline Phosphatase: 125 U/L (ref 38–126)
Anion gap: 13 (ref 5–15)
BILIRUBIN TOTAL: 6.6 mg/dL — AB (ref 0.3–1.2)
BUN: <5 mg/dL — ABNORMAL LOW (ref 6–20)
CO2: 19 mmol/L — ABNORMAL LOW (ref 22–32)
CREATININE: 0.81 mg/dL (ref 0.44–1.00)
Calcium: 8.7 mg/dL — ABNORMAL LOW (ref 8.9–10.3)
Chloride: 100 mmol/L — ABNORMAL LOW (ref 101–111)
GFR calc Af Amer: >60 mL/min (ref >60)
Glucose, Bld: 101 mg/dL — ABNORMAL HIGH (ref 65–99)
Potassium: 4 mmol/L (ref 3.5–5.1)
Sodium: 132 mmol/L — ABNORMAL LOW (ref 135–145)
TOTAL PROTEIN: 7.9 g/dL (ref 6.5–8.1)

## 2018-01-22 LAB — CBC
HCT: 24 % — ABNORMAL LOW (ref 36.0–46.0)
Hemoglobin: 7.7 g/dL — ABNORMAL LOW (ref 12.0–15.0)
MCH: 33.5 pg (ref 26.0–34.0)
MCHC: 32.1 g/dL (ref 30.0–36.0)
MCV: 104.3 fL — ABNORMAL HIGH (ref 78.0–100.0)
PLATELETS: 104 10*3/uL — AB (ref 150–400)
RBC: 2.3 MIL/uL — ABNORMAL LOW (ref 3.87–5.11)
RDW: 23.5 % — AB (ref 11.5–15.5)
WBC: 4.1 10*3/uL (ref 4.0–10.5)

## 2018-01-22 LAB — C DIFFICILE QUICK SCREEN W PCR REFLEX
C DIFFICILE (CDIFF) TOXIN: NEGATIVE
C DIFFICLE (CDIFF) ANTIGEN: POSITIVE — AB

## 2018-01-22 LAB — TSH
TSH: 2.037 u[IU]/mL (ref 0.350–4.500)
TSH: 4.235 u[IU]/mL (ref 0.350–4.500)

## 2018-01-22 LAB — GASTROINTESTINAL PANEL BY PCR, STOOL (REPLACES STOOL CULTURE)

## 2018-01-22 LAB — CLOSTRIDIUM DIFFICILE BY PCR, REFLEXED: Toxigenic C. Difficile by PCR: NEGATIVE

## 2018-01-22 LAB — CREATININE, SERUM
CREATININE: 1.33 mg/dL — AB (ref 0.44–1.00)
GFR, EST AFRICAN AMERICAN: 54 mL/min — AB (ref >60)
GFR, EST NON AFRICAN AMERICAN: 47 mL/min — AB (ref >60)

## 2018-01-22 LAB — RETICULOCYTES
RBC.: 2.29 MIL/uL — AB (ref 3.87–5.11)
RETIC COUNT ABSOLUTE: 142 10*3/uL (ref 19.0–186.0)
RETIC CT PCT: 6.2 % — AB (ref 0.4–3.1)

## 2018-01-22 LAB — LIPASE, BLOOD: LIPASE: 23 U/L (ref 11–51)

## 2018-01-22 LAB — PATHOLOGIST SMEAR REVIEW

## 2018-01-22 LAB — AMMONIA: AMMONIA: 54 umol/L — AB (ref 9–35)

## 2018-01-22 MED ORDER — ENOXAPARIN SODIUM 40 MG/0.4ML ~~LOC~~ SOLN
40.0000 mg | SUBCUTANEOUS | Status: DC
  Administered 2018-01-22: 40 mg via SUBCUTANEOUS
  Filled 2018-01-22: qty 0.4

## 2018-01-22 MED ORDER — ACETAMINOPHEN 500 MG PO TABS: 1000.0000 mg | ORAL_TABLET | Freq: Once | ORAL | Status: DC

## 2018-01-22 MED ORDER — HYDROMORPHONE HCL 1 MG/ML IJ SOLN
1.0000 mg | Freq: Once | INTRAMUSCULAR | Status: AC
  Administered 2018-01-22: 1 mg via INTRAVENOUS
  Filled 2018-01-22: qty 1

## 2018-01-22 MED ORDER — NICOTINE 21 MG/24HR TD PT24
21.0000 mg | MEDICATED_PATCH | Freq: Every day | TRANSDERMAL | Status: DC
  Administered 2018-01-23 – 2018-01-26 (×4): 21 mg via TRANSDERMAL
  Filled 2018-01-22 (×4): qty 1

## 2018-01-22 MED ORDER — VITAMIN B-1 100 MG PO TABS
100.0000 mg | ORAL_TABLET | Freq: Every day | ORAL | Status: DC
  Administered 2018-01-22 – 2018-01-26 (×5): 100 mg via ORAL
  Filled 2018-01-22 (×5): qty 1

## 2018-01-22 MED ORDER — POTASSIUM CHLORIDE IN NACL 20-0.9 MEQ/L-% IV SOLN
INTRAVENOUS | Status: DC
  Administered 2018-01-22 – 2018-01-23 (×2): via INTRAVENOUS
  Filled 2018-01-22 (×3): qty 1000

## 2018-01-22 MED ORDER — PREGABALIN 25 MG PO CAPS
50.0000 mg | ORAL_CAPSULE | Freq: Three times a day (TID) | ORAL | Status: DC
  Administered 2018-01-22 – 2018-01-26 (×12): 50 mg via ORAL
  Filled 2018-01-22 (×12): qty 2

## 2018-01-22 MED ORDER — DICYCLOMINE HCL 10 MG PO CAPS
10.0000 mg | ORAL_CAPSULE | Freq: Three times a day (TID) | ORAL | Status: DC | PRN
  Administered 2018-01-24: 10 mg via ORAL
  Filled 2018-01-22: qty 1

## 2018-01-22 MED ORDER — FOLIC ACID 1 MG PO TABS
1.0000 mg | ORAL_TABLET | Freq: Every day | ORAL | Status: DC
  Administered 2018-01-22 – 2018-01-26 (×5): 1 mg via ORAL
  Filled 2018-01-22 (×5): qty 1

## 2018-01-22 MED ORDER — CLOTRIMAZOLE 10 MG MT TROC
10.0000 mg | Freq: Every day | OROMUCOSAL | Status: DC
  Administered 2018-01-22 – 2018-01-25 (×13): 10 mg via ORAL
  Filled 2018-01-22 (×17): qty 1

## 2018-01-22 MED ORDER — ONDANSETRON HCL 4 MG/2ML IJ SOLN
4.0000 mg | Freq: Four times a day (QID) | INTRAMUSCULAR | Status: DC | PRN
  Administered 2018-01-24 – 2018-01-26 (×2): 4 mg via INTRAVENOUS
  Filled 2018-01-22 (×2): qty 2

## 2018-01-22 MED ORDER — SODIUM CHLORIDE 0.9 % IV BOLUS
1000.0000 mL | Freq: Once | INTRAVENOUS | Status: AC
  Administered 2018-01-22: 1000 mL via INTRAVENOUS

## 2018-01-22 MED ORDER — LEVOTHYROXINE SODIUM 75 MCG PO TABS
75.0000 ug | ORAL_TABLET | Freq: Every day | ORAL | Status: DC
  Administered 2018-01-23: 75 ug via ORAL
  Filled 2018-01-22: qty 1

## 2018-01-22 MED ORDER — OXYCODONE HCL 5 MG PO TABS
5.0000 mg | ORAL_TABLET | Freq: Four times a day (QID) | ORAL | Status: DC | PRN
  Administered 2018-01-22 – 2018-01-26 (×14): 5 mg via ORAL
  Filled 2018-01-22 (×16): qty 1

## 2018-01-22 MED ORDER — BARIUM SULFATE 2.1 % PO SUSP
ORAL | Status: AC
  Filled 2018-01-22: qty 2

## 2018-01-22 MED ORDER — ONDANSETRON HCL 4 MG PO TABS
4.0000 mg | ORAL_TABLET | Freq: Four times a day (QID) | ORAL | Status: DC | PRN
  Administered 2018-01-22: 4 mg via ORAL
  Filled 2018-01-22: qty 1

## 2018-01-22 MED ORDER — ADULT MULTIVITAMIN W/MINERALS CH
1.0000 | ORAL_TABLET | Freq: Every day | ORAL | Status: DC
  Administered 2018-01-22 – 2018-01-26 (×5): 1 via ORAL
  Filled 2018-01-22 (×5): qty 1

## 2018-01-22 MED ORDER — COLESTIPOL HCL 1 G PO TABS
1.0000 g | ORAL_TABLET | Freq: Two times a day (BID) | ORAL | Status: DC
  Administered 2018-01-22 – 2018-01-26 (×7): 1 g via ORAL
  Filled 2018-01-22 (×9): qty 1

## 2018-01-22 MED ORDER — ONDANSETRON HCL 4 MG/2ML IJ SOLN
4.0000 mg | Freq: Once | INTRAMUSCULAR | Status: AC
  Administered 2018-01-22: 4 mg via INTRAVENOUS
  Filled 2018-01-22: qty 2

## 2018-01-22 MED ORDER — ENSURE ENLIVE PO LIQD: 237.0000 mL | Freq: Two times a day (BID) | ORAL | Status: DC
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
## 2018-01-22 NOTE — ED Notes (Signed)
Pt left in triage room d/t high probability of pt having C.diff...... Pt can be heard from room yelling at someone on the phone stating that we are doing nothing for her... Went into room and explained to pt that she is technically "in the waiting room" but I have her in a triage room so she is keeping herself and others safe and that unfortunately she is waiting like everyone else.
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
## 2018-01-22 NOTE — ED Triage Notes (Signed)
Pt reports abd pain, N/V/D X4 days. Pt DC from hospital within past mo for C.Diff, anemia, etc. Pt has jaundice appearance.

## 2018-01-22 NOTE — ED Notes (Signed)
Pt says she fells like she has to have BM, Pt states " I took imodium and it is starting to wear off".
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
## 2018-01-22 NOTE — ED Notes (Addendum)
Pt felt like she had to have BM. Pt only passed flatus, unable to collect sample.  Pt transported to CT.

## 2018-01-23 DIAGNOSIS — D594 Other nonautoimmune hemolytic anemias: Secondary | ICD-10-CM

## 2018-01-23 DIAGNOSIS — Z9889 Other specified postprocedural states: Secondary | ICD-10-CM

## 2018-01-23 DIAGNOSIS — B379 Candidiasis, unspecified: Secondary | ICD-10-CM

## 2018-01-23 LAB — CBC
HEMATOCRIT: 20.6 % — AB (ref 36.0–46.0)
HEMOGLOBIN: 6.8 g/dL — AB (ref 12.0–15.0)
MCH: 34.9 pg — ABNORMAL HIGH (ref 26.0–34.0)
MCHC: 33 g/dL (ref 30.0–36.0)
MCV: 105.6 fL — ABNORMAL HIGH (ref 78.0–100.0)
Platelets: 88 10*3/uL — ABNORMAL LOW (ref 150–400)
RBC: 1.95 MIL/uL — AB (ref 3.87–5.11)
RDW: 23.9 % — AB (ref 11.5–15.5)
WBC: 4.7 10*3/uL (ref 4.0–10.5)

## 2018-01-23 LAB — COMPREHENSIVE METABOLIC PANEL
ALT: 17 U/L (ref 14–54)
ANION GAP: 7 (ref 5–15)
AST: 48 U/L — ABNORMAL HIGH (ref 15–41)
Albumin: 2.8 g/dL — ABNORMAL LOW (ref 3.5–5.0)
Alkaline Phosphatase: 115 U/L (ref 38–126)
BILIRUBIN TOTAL: 5.7 mg/dL — AB (ref 0.3–1.2)
CO2: 21 mmol/L — ABNORMAL LOW (ref 22–32)
Calcium: 8.1 mg/dL — ABNORMAL LOW (ref 8.9–10.3)
Chloride: 108 mmol/L (ref 101–111)
Creatinine, Ser: 0.77 mg/dL (ref 0.44–1.00)
Glucose, Bld: 92 mg/dL (ref 65–99)
POTASSIUM: 4.1 mmol/L (ref 3.5–5.1)
Sodium: 136 mmol/L (ref 135–145)
TOTAL PROTEIN: 6.6 g/dL (ref 6.5–8.1)

## 2018-01-23 LAB — GLUCOSE, CAPILLARY
GLUCOSE-CAPILLARY: 102 mg/dL — AB (ref 65–99)
Glucose-Capillary: 87 mg/dL (ref 65–99)

## 2018-01-23 LAB — LACTATE DEHYDROGENASE: LDH: 144 U/L (ref 98–192)

## 2018-01-23 LAB — HEMOGLOBIN AND HEMATOCRIT, BLOOD
HCT: 24.4 % — ABNORMAL LOW (ref 36.0–46.0)
HEMOGLOBIN: 8.5 g/dL — AB (ref 12.0–15.0)

## 2018-01-23 LAB — OCCULT BLOOD X 1 CARD TO LAB, STOOL: Fecal Occult Bld: NEGATIVE

## 2018-01-23 LAB — PREPARE RBC (CROSSMATCH)

## 2018-01-23 MED ORDER — LEVOTHYROXINE SODIUM 75 MCG PO TABS
75.0000 ug | ORAL_TABLET | Freq: Every day | ORAL | Status: DC
  Administered 2018-01-24 – 2018-01-26 (×3): 75 ug via ORAL
  Filled 2018-01-23 (×3): qty 1

## 2018-01-23 MED ORDER — SODIUM CHLORIDE 0.9 % IV SOLN: Freq: Once | INTRAVENOUS | Status: DC

## 2018-01-23 MED ORDER — BOOST / RESOURCE BREEZE PO LIQD CUSTOM
1.0000 | Freq: Three times a day (TID) | ORAL | Status: DC
  Administered 2018-01-23 – 2018-01-25 (×8): 1 via ORAL

## 2018-01-23 NOTE — Progress Notes (Signed)
INFECTIOUS DISEASE PROGRESS NOTE  ID: Jamie Allen is a 47 y.o. female with  Principal Problem:   Hemolytic anemia associated with infection ([place]) Active Problems:   Anxiety and depression   Hypothyroidism   Postmenopausal syndrome   Obesity (BMI 30-39.9)   Portal hypertensive gastropathy (HCC)   Recurrent Clostridium difficile diarrhea   Pancytopenia (HCC)   Nonalcoholic steatohepatitis ([facility])   Tobacco abuse   Immune deficiency disorder ([place])   History of blood transfusion  Subjective: No complaints No dysuria or cloudiness Urine smells "like antibiotics"  Abtx:  Anti-infectives (From admission, onward)   None      Medications:  Scheduled: . clotrimazole  10 mg Oral 5 X Daily  . colestipol  1 g Oral BID  . feeding supplement  1 Container Oral TID BM  . folic acid  1 mg Oral Daily  . levothyroxine  75 mcg Oral QAC breakfast  . multivitamin with minerals  1 tablet Oral Daily  . nicotine  21 mg Transdermal Daily  . pregabalin  50 mg Oral TID  . thiamine  100 mg Oral Daily    Objective: Vital signs in last 24 hours: Temp:  [98.3 F (36.8 C)-100.6 F (38.1 C)] 98.6 F (37 C) (04/06 1011) Pulse Rate:  [82-98] 87 (04/06 1011) Resp:  [15-19] 18 (04/06 1011) BP: (93-123)/(42-61) 100/46 (04/06 1011) SpO2:  [95 %-100 %] 99 % (04/06 1011) FiO2 (%):  [21 %] 21 % (04/05 1614) Weight:  [60.8 kg (134 lb 1.6 oz)-60.9 kg (134 lb 3.2 oz)] 60.8 kg (134 lb 1.6 oz) (04/06 0519)   General appearance: alert, cooperative and no distress Throat: no thrush Resp: clear to auscultation bilaterally Cardio: regular rate and rhythm GI: normal findings: bowel sounds normal and soft, non-tender  Lab Results Recent Labs    01/22/18 0424 01/22/18 1557 01/23/18 0121  WBC 4.6 4.1 4.7  HGB 8.1* 7.7* 6.8*  HCT 24.0* 24.0* 20.6*  NA 132*  --  136  K 4.0  --  4.1  CL 100*  --  108  CO2 19*  --  21*  BUN <5*  --  <5*  CREATININE 0.81 1.33* 0.77   Liver Panel Recent  Labs    01/22/18 0424 01/23/18 0121  PROT 7.9 6.6  ALBUMIN 3.1* 2.8*  AST 56* 48*  ALT 18 17  ALKPHOS 125 115  BILITOT 6.6* 5.7*   Sedimentation Rate No results for input(s): ESRSEDRATE in the last 72 hours. C-Reactive Protein No results for input(s): CRP in the last 72 hours.  Microbiology: Recent Results (from the past 240 hour(s))  Blood culture (routine x 2)     Status: None (Preliminary result)   Collection Time: 01/22/18  4:15 AM  Result Value Ref Range Status   Specimen Description BLOOD LEFT ARM  Final   Special Requests   Final    BOTTLES DRAWN AEROBIC AND ANAEROBIC Blood Culture results may not be optimal due to an excessive volume of blood received in culture bottles Performed at [facility] [address], [place], [place] [address]    Culture PENDING  Incomplete   Report Status PENDING  Incomplete  Urine culture     Status: None (Preliminary result)   Collection Time: 01/22/18  9:57 AM  Result Value Ref Range Status   Specimen Description URINE, RANDOM  Final   Special Requests NONE  Final   Culture   Final    CULTURE REINCUBATED FOR BETTER GROWTH Performed at  [facility], [place] [address], [place], [place] [address]    Report Status PENDING  Incomplete  Gastrointestinal Panel by PCR , Stool     Status: None   Collection Time: 01/22/18 12:30 PM  Result Value Ref Range Status   Campylobacter species NOT DETECTED NOT DETECTED Final   Plesimonas shigelloides NOT DETECTED NOT DETECTED Final   Salmonella species NOT DETECTED NOT DETECTED Final   Yersinia enterocolitica NOT DETECTED NOT DETECTED Final   Vibrio species NOT DETECTED NOT DETECTED Final   Vibrio cholerae NOT DETECTED NOT DETECTED Final   Enteroaggregative E coli (EAEC) NOT DETECTED NOT DETECTED Final   Enteropathogenic E coli (EPEC) NOT DETECTED NOT DETECTED Final   Enterotoxigenic E coli (ETEC) NOT DETECTED NOT DETECTED Final   Shiga like toxin producing E coli (STEC) NOT DETECTED  NOT DETECTED Final   Shigella/Enteroinvasive E coli (EIEC) NOT DETECTED NOT DETECTED Final   Cryptosporidium NOT DETECTED NOT DETECTED Final   Cyclospora cayetanensis NOT DETECTED NOT DETECTED Final   Entamoeba histolytica NOT DETECTED NOT DETECTED Final   Giardia lamblia NOT DETECTED NOT DETECTED Final   Adenovirus F40/41 NOT DETECTED NOT DETECTED Final   Astrovirus NOT DETECTED NOT DETECTED Final   Norovirus GI/GII NOT DETECTED NOT DETECTED Final   Rotavirus A NOT DETECTED NOT DETECTED Final   Sapovirus (I, II, IV, and V) NOT DETECTED NOT DETECTED Final    Comment: Performed at [facility], [address] [place], [place] [address]  C difficile quick scan w PCR reflex     Status: Abnormal   Collection Time: 01/22/18 12:30 PM  Result Value Ref Range Status   C Diff antigen POSITIVE (A) NEGATIVE Final   C Diff toxin NEGATIVE NEGATIVE Final   C Diff interpretation Results are indeterminate. See PCR results.  Final    Comment: Performed at [facility], [place] [address] [place], [place] [address]  C. Diff by PCR, Reflexed     Status: None   Collection Time: 01/22/18 12:30 PM  Result Value Ref Range Status   Toxigenic C. Difficile by PCR NEGATIVE NEGATIVE Final    Comment: Patient is colonized with non toxigenic C. difficile. May not need treatment unless significant symptoms are present. Performed at [facility], [place] [address] [place], [place] [address]     Studies/Results: Ct Abdomen Pelvis Wo Contrast  Result Date: 01/22/2018 CLINICAL DATA:  Left lower quadrant abdominal pain with fever and vomiting. History of cirrhosis EXAM: CT ABDOMEN AND PELVIS WITHOUT CONTRAST TECHNIQUE: Multidetector CT imaging of the abdomen and pelvis was performed following the standard protocol without IV contrast. COMPARISON:  12/24/2017 FINDINGS: Lower chest: Bibasilar atelectasis noted. Normal heart size. No pericardial or pleural effusion. No acute lower chest finding.  Hepatobiliary: Liver is enlarged measuring 19 cm in length. Nodularity to the liver surface compatible with underlying cirrhosis. Remote cholecystectomy. No biliary dilatation. No large focal abnormality of the liver by noncontrast imaging. Pancreas: No focal abnormality or ductal dilatation by noncontrast imaging. Spleen: Spleen is also enlarged as before measuring 16.7 cm in length. Adrenals/Urinary Tract: Normal adrenal glands. Atrophy of the left kidney appearing chronic. No renal obstruction or hydronephrosis. No hydroureter or obstructing ureteral calculus. Bladder unremarkable. Stomach/Bowel: Negative for bowel obstruction, significant dilatation, ileus, or free air. Normal appendix containing contrast. No significant bowel wall thickening. No fluid collection or abscess. Vascular/Lymphatic: Aortic atherosclerosis noted. Negative for aneurysm. No adenopathy. No retroperitoneal abnormality hemorrhage. Reproductive: Uterus and adnexa normal  in size. No pelvic free fluid or fluid collection. Other: No abdominal wall hernia or abnormality. No abdominopelvic ascites. Musculoskeletal: No acute or significant osseous findings. IMPRESSION: No acute abdominal or pelvic finding by noncontrast CT. Hepatomegaly and evidence of hepatic cirrhosis Associated splenomegaly Chronic left renal atrophy Remote cholecystectomy Atherosclerosis without aneurysm Electronically Signed   By: Jerilynn Mages.  Shick M.D.   On: 01/22/2018 12:29   Dg Chest 2 View  Result Date: 01/22/2018 CLINICAL DATA:  Fever and cough. EXAM: CHEST - 2 VIEW COMPARISON:  01/01/2018 FINDINGS: Lateral view degraded by patient arm position. Midline trachea.  Normal heart size and mediastinal contours. Sharp costophrenic angles. No pneumothorax. mild subsegmental atelectasis or scar at the left lung base. IMPRESSION: No active cardiopulmonary disease. Electronically Signed   By: Abigail Miyamoto M.D.   On: 01/22/2018 09:10     Assessment/Plan: HUS Prev C  diff Thrush  Total days of antibiotics: 0  She does not appear septic await her UCx but will hold treatment for now to prevent C diff recurrence Her syndrome could be from her prev C diff but she is not actively infected with C diff.  Appreciate Heme f/u.   Getting transfusion         Bobby Rumpf MD, FACP Infectious Diseases (pager) [phone] [email or web address] 01/23/2018, 11:10 AM  LOS: 1 day

## 2018-01-23 NOTE — Progress Notes (Signed)
CRITICAL VALUE ALERT  Critical Value:  Hemoglobin 6.8  Date & Time Notied: 01/23/18   0245  Provider Notified: yes   Orders Received/Actions taken:    Drue Flirt, RN

## 2018-01-23 NOTE — Progress Notes (Signed)
PROGRESS NOTE    Jamie Allen  YME:[id number] DOB: 08-28-1971 DOA: 01/22/2018 PCP: Ladell Pier, MD   Brief Narrative:  Patient is a 47 year old female with recurrent admissions for C. difficile infection, history of liver cirrhosis and chronic splenomegaly on imaging study, gastroesophageal reflux disease, hypertension, anemia, prior history of cocaine abuse in the 90s, tobacco abuse, hypothyroidism, depression presented to the ED with lower quadrant abdominal pain nausea vomiting or multiple loose stools.  Patient status post fecal transplant approximately a month ago and subsequently developed fever possible UTI was admitted found to have a fever of unknown origin discharged home and did okay until 2-3 days prior to admission when she presented with his symptoms of abdominal pain nausea vomiting and multiple loose stools.  CT scan was negative for any acute abnormalities.  Chest x-ray had no infiltrate noted.  C. difficile antigen was positive however toxin was negative.  Bilirubin noted to be elevated at 6.6.  Due to concerns for TTP/HUS secondary to schistocytes noted on peripheral smear patient was admitted for further evaluation and management.  Hematology and ID consulted.   Assessment & Plan:   Principal Problem:   Pancytopenia ([place]) Active Problems:   Diarrhea   Anxiety and depression   Hypothyroidism   Postmenopausal syndrome   GERD (gastroesophageal reflux disease)   Obesity (BMI 30-39.9)   Portal hypertensive gastropathy (HCC)   Anemia of chronic disease   Recurrent Clostridium difficile diarrhea   Nonalcoholic steatohepatitis ([facility])   Tobacco abuse   Acute on chronic anemia   Immune deficiency disorder (HCC)   History of blood transfusion   Hemolytic anemia associated with infection (HCC)   1 acute on chronic pancytopenia Patient was admitted with pancytopenia, anemia, elevated bilirubin, schistocytes noted on peripheral smear.  Initial concern was for TTP  or ITP.  Patient was seen in consultation by hematology who felt patient does not have TTP or ITP and schistocytes and would likely secondary to infection/sepsis.  It was felt patient did not need any transfusions unless she we will symptomatic or hemoglobin less than 7.  Patient noted to have a hemoglobin this morning of 6.8 and subsequently transfused a unit of packed red blood cells.  Platelet count currently at 88 from 113 on admission.  Patient with no overt bleeding.  Transfusion threshold platelet count less than 10.  Will discontinue Lovenox.  Hematology/oncology following.  2.  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  C. difficile PCR was antigen positive, toxin negative.  GI pathogen panel is negative.  She is seen in consultation by ID who feel patient C. difficile testing is not consistent with an acute infection and no need for antibiotics at this time.  Urine cultures with 20,000 colonies of Enterococcus faecalis, susceptibilities pending.  No need for any antibiotics at this time.  ID following.  3.  Oral thrush Continue Mycelex per ID.  4.  Hypothyroidism Continue home dose Synthroid.  5.  Nonalcoholic steatohepatitis/splenomegaly Outpatient follow-up with GI.  6.  Depression/anxiety Stable.  Continue home regimen.  7.  Tobacco abuse Tobacco cessation.  Placed on a nicotine patch.   DVT prophylaxis: SCDs Code Status: Full Family Communication: Updated patient.  No family at bedside. Disposition Plan: Likely back home once clinically improved and per hematology and ID.   Consultants:   Hematology/oncology: Dr. Alvy Bimler 01/22/2018  Infectious disease: Dr. Johnnye Sima 01/22/2018  Procedures:   Transfusion 1 unit packed red blood cells 01/23/2018  CT abdomen and pelvis 01/22/2018  Chest x-ray 01/22/2018  Antimicrobials:   None   Subjective: She is sitting up in bed.  Denies any chest pain or shortness of breath.   States frequency of loose stools decreasing however still very watery.  Denies any bleeding.  Objective: Vitals:   01/23/18 0956 01/23/18 1011 01/23/18 1142 01/23/18 1237  BP: (!) 107/42 (!) 100/46 (!) 94/48 (!) 109/47  Pulse: 85 87 75 83  Resp: 19 18 19 19   Temp: 99.6 F (37.6 C) 98.6 F (37 C) 98.4 F (36.9 C) 98.4 F (36.9 C)  TempSrc: Oral Oral Oral Oral  SpO2: 97% 99% 98% 99%  Weight:      Height:        Intake/Output Summary (Last 24 hours) at 01/23/2018 1646 Last data filed at 01/23/2018 1500 Gross per 24 hour  Intake 3260.5 ml  Output 300 ml  Net 2960.5 ml   Filed Weights   01/22/18 0405 01/22/18 1842 01/23/18 0519  Weight: 60.8 kg (134 lb) 60.9 kg (134 lb 3.2 oz) 60.8 kg (134 lb 1.6 oz)    Examination:  General exam: Appears calm and comfortable  Respiratory system: Clear to auscultation. Respiratory effort normal. Cardiovascular system: S1 & S2 heard, RRR. No JVD, murmurs, rubs, gallops or clicks. No pedal edema. Gastrointestinal system: Abdomen is nondistended, soft and nontender. No rebound.  No guarding.  Positive bowel sounds. Central nervous system: Alert and oriented. No focal neurological deficits. Extremities: Symmetric 5 x 5 power. Skin: No rashes, lesions or ulcers Psychiatry: Judgement and insight appear normal. Mood & affect appropriate.     Data Reviewed: I have personally reviewed following labs and imaging studies  CBC: Recent Labs  Lab 01/22/18 0424 01/22/18 1557 01/23/18 0121 01/23/18 1355  WBC 4.6 4.1 4.7  --   NEUTROABS 3.6  --   --   --   HGB 8.1* 7.7* 6.8* 8.5*  HCT 24.0* 24.0* 20.6* 24.4*  MCV 102.6* 104.3* 105.6*  --   PLT 113* 104* 88*  --    Basic Metabolic Panel: Recent Labs  Lab 01/22/18 0424 01/22/18 1557 01/23/18 0121  NA 132*  --  136  K 4.0  --  4.1  CL 100*  --  108  CO2 19*  --  21*  GLUCOSE 101*  --  92  BUN <5*  --  <5*  CREATININE 0.81 1.33* 0.77  CALCIUM 8.7*  --  8.1*   GFR: Estimated Creatinine  Clearance: 72.7 mL/min (by C-G formula based on SCr of 0.77 mg/dL). Liver Function Tests: Recent Labs  Lab 01/22/18 0424 01/23/18 0121  AST 56* 48*  ALT 18 17  ALKPHOS 125 115  BILITOT 6.6* 5.7*  PROT 7.9 6.6  ALBUMIN 3.1* 2.8*   Recent Labs  Lab 01/22/18 0424  LIPASE 23   Recent Labs  Lab 01/22/18 0832  AMMONIA 54*   Coagulation Profile: No results for input(s): INR, PROTIME in the last 168 hours. Cardiac Enzymes: No results for input(s): CKTOTAL, CKMB, CKMBINDEX, TROPONINI in the last 168 hours. BNP (last 3 results) No results for input(s): PROBNP in the last 8760 hours. HbA1C: No results for input(s): HGBA1C in the last 72 hours. CBG: Recent Labs  Lab 01/23/18 0817 01/23/18 1138  GLUCAP 87 102*   Lipid Profile: No results for input(s): CHOL, HDL, LDLCALC, TRIG, CHOLHDL, LDLDIRECT in the last 72 hours. Thyroid Function Tests: Recent Labs    01/22/18 1852  TSH 4.235   Anemia Panel: Recent  Labs    01/22/18 1557  RETICCTPCT 6.2*   Sepsis Labs: Recent Labs  Lab 01/22/18 0442 01/22/18 0851  LATICACIDVEN 2.15* 1.07    Recent Results (from the past 240 hour(s))  Blood culture (routine x 2)     Status: None (Preliminary result)   Collection Time: 01/22/18  4:15 AM  Result Value Ref Range Status   Specimen Description BLOOD LEFT ARM  Final   Special Requests   Final    BOTTLES DRAWN AEROBIC AND ANAEROBIC Blood Culture results may not be optimal due to an excessive volume of blood received in culture bottles   Culture   Final    NO GROWTH 1 DAY Performed at [facility], [place] [address], [place], [place] [address]    Report Status PENDING  Incomplete  Blood culture (routine x 2)     Status: None (Preliminary result)   Collection Time: 01/22/18  4:30 AM  Result Value Ref Range Status   Specimen Description BLOOD LEFT HAND  Final   Special Requests   Final    IN PEDIATRIC BOTTLE Blood Culture results may not be optimal due to an excessive volume  of blood received in culture bottles   Culture   Final    NO GROWTH 1 DAY Performed at [facility], [place] [address] [place], [place] [address]    Report Status PENDING  Incomplete  Urine culture     Status: Abnormal (Preliminary result)   Collection Time: 01/22/18  9:57 AM  Result Value Ref Range Status   Specimen Description URINE, RANDOM  Final   Special Requests NONE  Final   Culture (A)  Final    20,000 COLONIES/mL ENTEROCOCCUS FAECALIS SUSCEPTIBILITIES TO FOLLOW Performed at [facility], [place] [address] [place], [place] [address]    Report Status PENDING  Incomplete  Gastrointestinal Panel by PCR , Stool     Status: None   Collection Time: 01/22/18 12:30 PM  Result Value Ref Range Status   Campylobacter species NOT DETECTED NOT DETECTED Final   Plesimonas shigelloides NOT DETECTED NOT DETECTED Final   Salmonella species NOT DETECTED NOT DETECTED Final   Yersinia enterocolitica NOT DETECTED NOT DETECTED Final   Vibrio species NOT DETECTED NOT DETECTED Final   Vibrio cholerae NOT DETECTED NOT DETECTED Final   Enteroaggregative E coli (EAEC) NOT DETECTED NOT DETECTED Final   Enteropathogenic E coli (EPEC) NOT DETECTED NOT DETECTED Final   Enterotoxigenic E coli (ETEC) NOT DETECTED NOT DETECTED Final   Shiga like toxin producing E coli (STEC) NOT DETECTED NOT DETECTED Final   Shigella/Enteroinvasive E coli (EIEC) NOT DETECTED NOT DETECTED Final   Cryptosporidium NOT DETECTED NOT DETECTED Final   Cyclospora cayetanensis NOT DETECTED NOT DETECTED Final   Entamoeba histolytica NOT DETECTED NOT DETECTED Final   Giardia lamblia NOT DETECTED NOT DETECTED Final   Adenovirus F40/41 NOT DETECTED NOT DETECTED Final   Astrovirus NOT DETECTED NOT DETECTED Final   Norovirus GI/GII NOT DETECTED NOT DETECTED Final   Rotavirus A NOT DETECTED NOT DETECTED Final   Sapovirus (I, II, IV, and V) NOT DETECTED NOT DETECTED Final    Comment: Performed at [facility], [place] [place], [place] [address]  C difficile quick scan w PCR reflex     Status: Abnormal   Collection Time: 01/22/18 12:30 PM  Result Value Ref Range Status   C Diff antigen POSITIVE (A) NEGATIVE Final   C Diff toxin NEGATIVE NEGATIVE  Final   C Diff interpretation Results are indeterminate. See PCR results.  Final    Comment: Performed at [facility], [place] [address] [place], [place] [address]  C. Diff by PCR, Reflexed     Status: None   Collection Time: 01/22/18 12:30 PM  Result Value Ref Range Status   Toxigenic C. Difficile by PCR NEGATIVE NEGATIVE Final    Comment: Patient is colonized with non toxigenic C. difficile. May not need treatment unless significant symptoms are present. Performed at [facility], [place] [address], [place],  [address]          Radiology Studies: Ct Abdomen Pelvis Wo Contrast  Result Date: 01/22/2018 CLINICAL DATA:  Left lower quadrant abdominal pain with fever and vomiting. History of cirrhosis EXAM: CT ABDOMEN AND PELVIS WITHOUT CONTRAST TECHNIQUE: Multidetector CT imaging of the abdomen and pelvis was performed following the standard protocol without IV contrast. COMPARISON:  12/24/2017 FINDINGS: Lower chest: Bibasilar atelectasis noted. Normal heart size. No pericardial or pleural effusion. No acute lower chest finding. Hepatobiliary: Liver is enlarged measuring 19 cm in length. Nodularity to the liver surface compatible with underlying cirrhosis. Remote cholecystectomy. No biliary dilatation. No large focal abnormality of the liver by noncontrast imaging. Pancreas: No focal abnormality or ductal dilatation by noncontrast imaging. Spleen: Spleen is also enlarged as before measuring 16.7 cm in length. Adrenals/Urinary Tract: Normal adrenal glands. Atrophy of the left kidney appearing chronic. No renal obstruction or hydronephrosis. No hydroureter or obstructing ureteral calculus. Bladder unremarkable. Stomach/Bowel: Negative for bowel  obstruction, significant dilatation, ileus, or free air. Normal appendix containing contrast. No significant bowel wall thickening. No fluid collection or abscess. Vascular/Lymphatic: Aortic atherosclerosis noted. Negative for aneurysm. No adenopathy. No retroperitoneal abnormality hemorrhage. Reproductive: Uterus and adnexa normal in size. No pelvic free fluid or fluid collection. Other: No abdominal wall hernia or abnormality. No abdominopelvic ascites. Musculoskeletal: No acute or significant osseous findings. IMPRESSION: No acute abdominal or pelvic finding by noncontrast CT. Hepatomegaly and evidence of hepatic cirrhosis Associated splenomegaly Chronic left renal atrophy Remote cholecystectomy Atherosclerosis without aneurysm Electronically Signed   By: Jerilynn Mages.  Shick M.D.   On: 01/22/2018 12:29   Dg Chest 2 View  Result Date: 01/22/2018 CLINICAL DATA:  Fever and cough. EXAM: CHEST - 2 VIEW COMPARISON:  01/01/2018 FINDINGS: Lateral view degraded by patient arm position. Midline trachea.  Normal heart size and mediastinal contours. Sharp costophrenic angles. No pneumothorax. mild subsegmental atelectasis or scar at the left lung base. IMPRESSION: No active cardiopulmonary disease. Electronically Signed   By: Abigail Miyamoto M.D.   On: 01/22/2018 09:10        Scheduled Meds: . clotrimazole  10 mg Oral 5 X Daily  . colestipol  1 g Oral BID  . feeding supplement  1 Container Oral TID BM  . folic acid  1 mg Oral Daily  . levothyroxine  75 mcg Oral QAC breakfast  . multivitamin with minerals  1 tablet Oral Daily  . nicotine  21 mg Transdermal Daily  . pregabalin  50 mg Oral TID  . thiamine  100 mg Oral Daily   Continuous Infusions: . sodium chloride    . 0.9 % NaCl with KCl 20 mEq / L 100 mL/hr at 01/23/18 0657     LOS: 1 day    Time spent: 35 minutes    Irine Seal, MD [facility] Pager [phone]  If 7PM-7AM, please contact night-coverage [email or web address] Password  [facility] 01/23/2018, 4:46 PM

## 2018-01-23 NOTE — Progress Notes (Signed)
Initial Nutrition Assessment  DOCUMENTATION CODES:   Not applicable  INTERVENTION:  - Will d/c Ensure Enlive per pt preference.  - Will order Boost Breeze po TID, each supplement provides 250 kcal and 9 grams of protein - Continue to encourage PO intakes.  - RD will monitor for diet education needs throughout hospitalization/prior to d/c.   NUTRITION DIAGNOSIS:   Inadequate oral intake related to chronic illness(NASH, recurrent C. diff) as evidenced by per patient/family report.  GOAL:   Patient will meet greater than or equal to 90% of their needs  MONITOR:   PO intake, Supplement acceptance, Weight trends, Labs, I & O's  REASON FOR ASSESSMENT:   Malnutrition Screening Tool  ASSESSMENT:   47 y.o. 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 She presented to the ED on 4/4 with complaints of lower quadrant abdominal pain, nausea, vomiting, and loose stools.  The patient has a history of C. difficile infection 3 times starting 4-1/2 years ago and underwent fecal transplant in March. In the ED she had a CT scan of the abdomen which was negative and urinalysis which was unremarkable, chest x-ray which showed no pneumonia, and a C. difficile antigen that was positive and a toxin that was negative.  PCR is pending.  Her bilirubin is noted to be elevated at 6.6 and a pathologist called the emergency department physician with concerns about the patient having TTP/HUS due to schistocytes.     BMI indicates overweight status. No intakes documented since admission. Much of the information obtained from pt consistent with information provided to RD in December 2018. Pt reports that she has had a decreased appetite for greater than one year d/t early satiety with NASH, recurrent C. Diff with associated N/V and  multiple stools each day. Due to concern for electrolyte imbalance and dehydration, she likes to drink Gatorade and has consumed Boost Breeze in the past without issue. Pt often losses her appetite and may go a day or more without eating anything.   She began having N/V and increased loose stools several days PTA and this affected her appetite, caused a decrease in oral intake or desire to consume anything PO. Physical assessment showed no muscle or fat wasting. Pt feels she has lost weight from poor intakes and fluid loss. Per chart review, weight has been stable since November 2018. Unable to identify malnutrition based on ASPEN guidelines at this time but pt is at very high risk. Will continue to monitor closely.   Medications reviewed; 1 mg oral folic acid/day, 75 mcg oral Synthroid/day, 100 mg thiamine/day.  Labs reviewed; BUN: <5 mg/dL, Ca: 8.1 mg/dL.  IVF: NS-20 mEq KCl @ 100 mL/hr.      NUTRITION - FOCUSED PHYSICAL EXAM:  Completed/assessed with no muscle and no fat wasting. Generalized, mild edema noted.   Diet Order:  Diet regular Room service appropriate? Yes; Fluid consistency: Thin  EDUCATION NEEDS:   No education needs have been identified at this time  Skin:  Skin Assessment: Skin Integrity Issues: Skin Integrity Issues:: Incisions Incisions: R hip incision 12/30/17  Last BM:  4/5  Height:   Ht Readings from Last 1 Encounters:  01/22/18 5\' 1"  (1.549 m)    Weight:   Wt Readings from Last 1 Encounters:  01/23/18 134 lb 1.6 oz (60.8 kg)    Ideal Body Weight:  47.73 kg  BMI:  Body mass index is 25.34 kg/m.  Estimated Nutritional Needs:   Kcal:  7276-1848 (28-32 kcal/kg)  Protein:  73-85 (1.2-1.4 grams/kg)  Fluid:  >/= 1.8 L/day      Jarome Matin, MS, RD, LDN, [facility] Clinical Dietitian Pager # [phone] After hours/weekend pager # [phone]

## 2018-01-24 DIAGNOSIS — B952 Enterococcus as the cause of diseases classified elsewhere: Secondary | ICD-10-CM

## 2018-01-24 DIAGNOSIS — R1012 Left upper quadrant pain: Secondary | ICD-10-CM

## 2018-01-24 DIAGNOSIS — D593 Hemolytic-uremic syndrome: Secondary | ICD-10-CM

## 2018-01-24 DIAGNOSIS — R822 Biliuria: Secondary | ICD-10-CM

## 2018-01-24 DIAGNOSIS — R1011 Right upper quadrant pain: Secondary | ICD-10-CM

## 2018-01-24 DIAGNOSIS — K219 Gastro-esophageal reflux disease without esophagitis: Secondary | ICD-10-CM

## 2018-01-24 LAB — CBC WITH DIFFERENTIAL/PLATELET
BASOS ABS: 0 10*3/uL (ref 0.0–0.1)
Basophils Relative: 1 %
EOS ABS: 0.1 10*3/uL (ref 0.0–0.7)
Eosinophils Relative: 3 %
HCT: 24 % — ABNORMAL LOW (ref 36.0–46.0)
Hemoglobin: 7.7 g/dL — ABNORMAL LOW (ref 12.0–15.0)
LYMPHS ABS: 0.8 10*3/uL (ref 0.7–4.0)
Lymphocytes Relative: 22 %
MCH: 32.5 pg (ref 26.0–34.0)
MCHC: 32.1 g/dL (ref 30.0–36.0)
MCV: 101.3 fL — ABNORMAL HIGH (ref 78.0–100.0)
MONO ABS: 0.2 10*3/uL (ref 0.1–1.0)
Monocytes Relative: 5 %
NEUTROS ABS: 2.7 10*3/uL (ref 1.7–7.7)
Neutrophils Relative %: 69 %
PLATELETS: 85 10*3/uL — AB (ref 150–400)
RBC: 2.37 MIL/uL — AB (ref 3.87–5.11)
RDW: 25.5 % — AB (ref 11.5–15.5)
WBC: 3.8 10*3/uL — AB (ref 4.0–10.5)

## 2018-01-24 LAB — URINE CULTURE: Culture: 20000 — AB

## 2018-01-24 LAB — TYPE AND SCREEN
ABO/RH(D): O NEG
Antibody Screen: POSITIVE
Donor AG Type: NEGATIVE
Unit division: 0

## 2018-01-24 LAB — BPAM RBC
BLOOD PRODUCT EXPIRATION DATE: 201904242359
ISSUE DATE / TIME: 201904060941
Unit Type and Rh: 9500

## 2018-01-24 LAB — HEPATIC FUNCTION PANEL
ALBUMIN: 2.9 g/dL — AB (ref 3.5–5.0)
ALT: 17 U/L (ref 14–54)
AST: 44 U/L — ABNORMAL HIGH (ref 15–41)
Alkaline Phosphatase: 115 U/L (ref 38–126)
Bilirubin, Direct: 1.2 mg/dL — ABNORMAL HIGH (ref 0.1–0.5)
Indirect Bilirubin: 3.3 mg/dL — ABNORMAL HIGH (ref 0.3–0.9)
Total Bilirubin: 4.5 mg/dL — ABNORMAL HIGH (ref 0.3–1.2)
Total Protein: 7.1 g/dL (ref 6.5–8.1)

## 2018-01-24 LAB — BASIC METABOLIC PANEL
Anion gap: 9 (ref 5–15)
BUN: <5 mg/dL — ABNORMAL LOW (ref 6–20)
CALCIUM: 8.2 mg/dL — AB (ref 8.9–10.3)
CO2: 19 mmol/L — AB (ref 22–32)
CREATININE: 0.8 mg/dL (ref 0.44–1.00)
Chloride: 107 mmol/L (ref 101–111)
Glucose, Bld: 97 mg/dL (ref 65–99)
Potassium: 3.8 mmol/L (ref 3.5–5.1)
SODIUM: 135 mmol/L (ref 135–145)

## 2018-01-24 LAB — LACTATE DEHYDROGENASE: LDH: 166 U/L (ref 98–192)

## 2018-01-24 NOTE — Plan of Care (Signed)

## 2018-01-24 NOTE — Progress Notes (Signed)
Pt was in a lot of pain and discomfort from abdominal distention. Pain was addressed by distraction before the next dose of PRN pain medication was due. Then pain medication was administered.  Patient was educated on the SCD's I put them on her this morning around 0745 but she took them off later on after I left the room.

## 2018-01-24 NOTE — Progress Notes (Signed)
INFECTIOUS DISEASE PROGRESS NOTE  ID: Jamie Allen is a 47 y.o. female with  Principal Problem:   Pancytopenia ([place]) Active Problems:   Diarrhea   Anxiety and depression   Hypothyroidism   Postmenopausal syndrome   GERD (gastroesophageal reflux disease)   Obesity (BMI 30-39.9)   Portal hypertensive gastropathy (HCC)   Anemia of chronic disease   Recurrent Clostridium difficile diarrhea   Nonalcoholic steatohepatitis (NASH)   Tobacco abuse   Acute on chronic anemia   Immune deficiency disorder (HCC)   History of blood transfusion   Hemolytic anemia associated with infection (HCC)  Subjective: C/o abd pain. 3 loose BM this AM.   Abtx:  Anti-infectives (From admission, onward)   None      Medications:  Scheduled: . clotrimazole  10 mg Oral 5 X Daily  . colestipol  1 g Oral BID  . feeding supplement  1 Container Oral TID BM  . folic acid  1 mg Oral Daily  . levothyroxine  75 mcg Oral QAC breakfast  . multivitamin with minerals  1 tablet Oral Daily  . nicotine  21 mg Transdermal Daily  . pregabalin  50 mg Oral TID  . thiamine  100 mg Oral Daily    Objective: Vital signs in last 24 hours: Temp:  [98.4 F (36.9 C)-99.3 F (37.4 C)] 99.3 F (37.4 C) (04/07 0719) Pulse Rate:  [83-92] 88 (04/07 0719) Resp:  [19-20] 20 (04/07 0719) BP: (104-123)/(47-55) 104/49 (04/07 0719) SpO2:  [97 %-100 %] 97 % (04/07 0719) Weight:  [62.3 kg (137 lb 4.8 oz)] 62.3 kg (137 lb 4.8 oz) (04/07 0719)   General appearance: alert, cooperative and no distress Resp: clear to auscultation bilaterally Cardio: regular rate and rhythm GI: normal findings: bowel sounds normal and abnormal findings:  mild tenderness in the RUQ and in the LUQ no rebound or guarding.   Lab Results Recent Labs    01/23/18 0121 01/23/18 1355 01/24/18 0500  WBC 4.7  --  3.8*  HGB 6.8* 8.5* 7.7*  HCT 20.6* 24.4* 24.0*  NA 136  --  135  K 4.1  --  3.8  CL 108  --  107  CO2 21*  --  19*  BUN <5*   --  <5*  CREATININE 0.77  --  0.80   Liver Panel Recent Labs    01/23/18 0121 01/24/18 0500  PROT 6.6 7.1  ALBUMIN 2.8* 2.9*  AST 48* 44*  ALT 17 17  ALKPHOS 115 115  BILITOT 5.7* 4.5*  BILIDIR  --  1.2*  IBILI  --  3.3*   Sedimentation Rate No results for input(s): ESRSEDRATE in the last 72 hours. C-Reactive Protein No results for input(s): CRP in the last 72 hours.  Microbiology: Recent Results (from the past 240 hour(s))  Blood culture (routine x 2)     Status: None (Preliminary result)   Collection Time: 01/22/18  4:15 AM  Result Value Ref Range Status   Specimen Description BLOOD LEFT ARM  Final   Special Requests   Final    BOTTLES DRAWN AEROBIC AND ANAEROBIC Blood Culture results may not be optimal due to an excessive volume of blood received in culture bottles   Culture   Final    NO GROWTH 1 DAY Performed at [facility], [place] [address], [place], [place] [address]    Report Status PENDING  Incomplete  Blood culture (routine x 2)     Status: None (Preliminary result)  Collection Time: 01/22/18  4:30 AM  Result Value Ref Range Status   Specimen Description BLOOD LEFT HAND  Final   Special Requests   Final    IN PEDIATRIC BOTTLE Blood Culture results may not be optimal due to an excessive volume of blood received in culture bottles   Culture   Final    NO GROWTH 1 DAY Performed at [facility] [address] [place], [place] [address]    Report Status PENDING  Incomplete  Urine culture     Status: Abnormal   Collection Time: 01/22/18  9:57 AM  Result Value Ref Range Status   Specimen Description URINE, RANDOM  Final   Special Requests   Final    NONE Performed at [facility], [place] [address], [place], [place] [address]    Culture 20,000 COLONIES/mL ENTEROCOCCUS FAECALIS (A)  Final   Report Status 01/24/2018 FINAL  Final   Organism ID, Bacteria ENTEROCOCCUS FAECALIS (A)  Final      Susceptibility   Enterococcus faecalis - MIC*     AMPICILLIN <=2 SENSITIVE Sensitive     LEVOFLOXACIN 1 SENSITIVE Sensitive     NITROFURANTOIN <=16 SENSITIVE Sensitive     VANCOMYCIN 1 SENSITIVE Sensitive     * 20,000 COLONIES/mL ENTEROCOCCUS FAECALIS  Gastrointestinal Panel by PCR , Stool     Status: None   Collection Time: 01/22/18 12:30 PM  Result Value Ref Range Status   Campylobacter species NOT DETECTED NOT DETECTED Final   Plesimonas shigelloides NOT DETECTED NOT DETECTED Final   Salmonella species NOT DETECTED NOT DETECTED Final   Yersinia enterocolitica NOT DETECTED NOT DETECTED Final   Vibrio species NOT DETECTED NOT DETECTED Final   Vibrio cholerae NOT DETECTED NOT DETECTED Final   Enteroaggregative E coli (EAEC) NOT DETECTED NOT DETECTED Final   Enteropathogenic E coli (EPEC) NOT DETECTED NOT DETECTED Final   Enterotoxigenic E coli (ETEC) NOT DETECTED NOT DETECTED Final   Shiga like toxin producing E coli (STEC) NOT DETECTED NOT DETECTED Final   Shigella/Enteroinvasive E coli (EIEC) NOT DETECTED NOT DETECTED Final   Cryptosporidium NOT DETECTED NOT DETECTED Final   Cyclospora cayetanensis NOT DETECTED NOT DETECTED Final   Entamoeba histolytica NOT DETECTED NOT DETECTED Final   Giardia lamblia NOT DETECTED NOT DETECTED Final   Adenovirus F40/41 NOT DETECTED NOT DETECTED Final   Astrovirus NOT DETECTED NOT DETECTED Final   Norovirus GI/GII NOT DETECTED NOT DETECTED Final   Rotavirus A NOT DETECTED NOT DETECTED Final   Sapovirus (I, II, IV, and V) NOT DETECTED NOT DETECTED Final    Comment: Performed at [facility], [place] [place], [place] [address]  C difficile quick scan w PCR reflex     Status: Abnormal   Collection Time: 01/22/18 12:30 PM  Result Value Ref Range Status   C Diff antigen POSITIVE (A) NEGATIVE Final   C Diff toxin NEGATIVE NEGATIVE Final   C Diff interpretation Results are indeterminate. See PCR results.  Final    Comment: Performed at [facility], [place] [address]  [place], [place] [address]  C. Diff by PCR, Reflexed     Status: None   Collection Time: 01/22/18 12:30 PM  Result Value Ref Range Status   Toxigenic C. Difficile by PCR NEGATIVE NEGATIVE Final    Comment: Patient is colonized with non toxigenic C. difficile. May not need treatment unless significant symptoms are present. Performed at [facility], [place]  [address], [place], [place] [address]     Studies/Results: Ct Abdomen Pelvis Wo Contrast  Result Date: 01/22/2018 CLINICAL DATA:  Left lower quadrant abdominal pain with fever and vomiting. History of cirrhosis EXAM: CT ABDOMEN AND PELVIS WITHOUT CONTRAST TECHNIQUE: Multidetector CT imaging of the abdomen and pelvis was performed following the standard protocol without IV contrast. COMPARISON:  12/24/2017 FINDINGS: Lower chest: Bibasilar atelectasis noted. Normal heart size. No pericardial or pleural effusion. No acute lower chest finding. Hepatobiliary: Liver is enlarged measuring 19 cm in length. Nodularity to the liver surface compatible with underlying cirrhosis. Remote cholecystectomy. No biliary dilatation. No large focal abnormality of the liver by noncontrast imaging. Pancreas: No focal abnormality or ductal dilatation by noncontrast imaging. Spleen: Spleen is also enlarged as before measuring 16.7 cm in length. Adrenals/Urinary Tract: Normal adrenal glands. Atrophy of the left kidney appearing chronic. No renal obstruction or hydronephrosis. No hydroureter or obstructing ureteral calculus. Bladder unremarkable. Stomach/Bowel: Negative for bowel obstruction, significant dilatation, ileus, or free air. Normal appendix containing contrast. No significant bowel wall thickening. No fluid collection or abscess. Vascular/Lymphatic: Aortic atherosclerosis noted. Negative for aneurysm. No adenopathy. No retroperitoneal abnormality hemorrhage. Reproductive: Uterus and adnexa normal in size. No pelvic free fluid or fluid collection. Other: No abdominal wall  hernia or abnormality. No abdominopelvic ascites. Musculoskeletal: No acute or significant osseous findings. IMPRESSION: No acute abdominal or pelvic finding by noncontrast CT. Hepatomegaly and evidence of hepatic cirrhosis Associated splenomegaly Chronic left renal atrophy Remote cholecystectomy Atherosclerosis without aneurysm Electronically Signed   By: Jerilynn Mages.  Shick M.D.   On: 01/22/2018 12:29     Assessment/Plan: HUS Prev C diff Thrush 20k Enterococcus on UCx (pan-sens)  Total days of antibiotics: 0 mycelex       Her Hgb is down 0.8 over last 17h.  She does not appear "septic": does not have fever, BP stable, WBC is normal. Her BCx are negative.  I would continue to have heme follow her Would not treat her UCx.       Bobby Rumpf MD, FACP Infectious Diseases (pager) [phone] [email or web address] 01/24/2018, 12:01 PM  LOS: 2 days

## 2018-01-24 NOTE — Progress Notes (Signed)
Jamie Allen   DOB:[birth year]   WH#:[id number]    Assessment & Plan:   Acute on chronic pancytopenia Likely secondary to chronic liver disease and splenomegaly Overall stable She does not have TTP or ITP This schistocytes seen is likely due to infection/sepsis Blood work suggested signs of hemolysis.  Coombs test was negative.  Serum ceruloplasmin and copper level were negative She had numerous workup in the past including bone marrow biopsy which was unrevealing I will order additional workup for tomorrow including myeloma panel, free light chain, cryoglobulin level and PNH screen to exclude rare causes of her pancytopenia She does not need blood transfusion unless symptomatic with hemoglobin less than 7 She does not need platelet transfusion unless active signs of bleeding or platelet count less than 10  High bilirubin level She has chronic liver cirrhosis Recent blood work suggested a combination of liver disease and signs of hemolysis  Chronic liver disease with cirrhosis and splenomegaly I would defer to liver specialist for further management  Fever, severe diarrhea and possible recurrent C. difficile infection Will defer to infectious disease team for further management  Clinical dehydration She has responded to IV fluid resuscitation  Discharge planning Would defer to primary service Please call if questions arise Dr. Marin Olp will follow up on the patient next week  Heath Lark, MD 01/24/2018  2:57 PM   Subjective:  She felt weak.  She continues to have intermittent diarrhea. The patient denies any recent signs or symptoms of bleeding such as spontaneous epistaxis, hematuria or hematochezia. She needed blood transfusion over the weekend due to severe anemia. Upon further questioning, the patient stated that she had recent early drank a bottle of NyQuil over 2 days to feel better due to symptoms of URI.  She is aware that there is significant amount alcohol in a  bottle of NyQuil  Objective:  Vitals:   01/24/18 0719 01/24/18 1202  BP: (!) 104/49 (!) 106/47  Pulse: 88 94  Resp: 20 16  Temp: 99.3 F (37.4 C) 99.3 F (37.4 C)  SpO2: 97% 98%     Intake/Output Summary (Last 24 hours) at 01/24/2018 1457 Last data filed at 01/24/2018 1205 Gross per 24 hour  Intake 2242 ml  Output 1100 ml  Net 1142 ml    GENERAL:alert, no distress and comfortable.  She appears jaundiced SKIN: She appears jaundiced EYES: normal, Conjunctiva are jaundiced NEURO: alert & oriented x 3 with fluent speech, no focal motor/sensory deficits   Labs:  Lab Results  Component Value Date   WBC 3.8 (L) 01/24/2018   HGB 7.7 (L) 01/24/2018   HCT 24.0 (L) 01/24/2018   MCV 101.3 (H) 01/24/2018   PLT 85 (L) 01/24/2018   NEUTROABS 2.7 01/24/2018    Lab Results  Component Value Date   NA 135 01/24/2018   K 3.8 01/24/2018   CL 107 01/24/2018   CO2 19 (L) 01/24/2018

## 2018-01-24 NOTE — Progress Notes (Signed)
PROGRESS NOTE    Jamie Allen  HUT:[id number] DOB: 1971-09-16 DOA: 01/22/2018 PCP: Ladell Pier, MD   Brief Narrative:  Patient is a 47 year old female with recurrent admissions for C. difficile infection, history of liver cirrhosis and chronic splenomegaly on imaging study, gastroesophageal reflux disease, hypertension, anemia, prior history of cocaine abuse in the 90s, tobacco abuse, hypothyroidism, depression presented to the ED with lower quadrant abdominal pain nausea vomiting or multiple loose stools.  Patient status post fecal transplant approximately a month ago and subsequently developed fever possible UTI was admitted found to have a fever of unknown origin discharged home and did okay until 2-3 days prior to admission when she presented with his symptoms of abdominal pain nausea vomiting and multiple loose stools.  CT scan was negative for any acute abnormalities.  Chest x-ray had no infiltrate noted.  C. difficile antigen was positive however toxin was negative.  Bilirubin noted to be elevated at 6.6.  Due to concerns for TTP/HUS secondary to schistocytes noted on peripheral smear patient was admitted for further evaluation and management.  Hematology and ID consulted.   Assessment & Plan:   Principal Problem:   Pancytopenia ([place]) Active Problems:   Diarrhea   Anxiety and depression   Hypothyroidism   Postmenopausal syndrome   GERD (gastroesophageal reflux disease)   Obesity (BMI 30-39.9)   Portal hypertensive gastropathy (HCC)   Anemia of chronic disease   Recurrent Clostridium difficile diarrhea   Nonalcoholic steatohepatitis (NASH)   Tobacco abuse   Acute on chronic anemia   Immune deficiency disorder (HCC)   History of blood transfusion   Hemolytic anemia associated with infection (HCC)   1 acute on chronic pancytopenia Patient was admitted with pancytopenia, anemia, elevated bilirubin, schistocytes noted on peripheral smear.  Initial concern was for TTP  or ITP.  Patient was seen in consultation by hematology who felt patient does not have TTP or ITP and schistocytes and would likely secondary to infection/sepsis.  It was felt patient did not need any transfusions unless she we will symptomatic or hemoglobin less than 7.  Patient noted to have a hemoglobin the morning of 01/23/2018 of 6.8 and subsequently transfused a unit of packed red blood cells.  Hemoglobin currently at 7.7.  Platelet count currently at 85 from 88 from 113 on admission.  Patient with no overt bleeding.  Transfusion threshold platelet count less than 10.  Lovenox have been discontinued.  Per hematology/oncology.   2.  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  C. difficile PCR was antigen positive, toxin negative.  GI pathogen panel is negative. Patient was seen in consultation by ID who feel patient C. difficile testing is not consistent with an acute infection and no need for antibiotics at this time.  Urine cultures with 20,000 colonies of Enterococcus faecalis, susceptibilities pending.  Patient states the frequency of stools decreasing.  No need for any antibiotics at this time.  ID following.  3.  Oral thrush Continue Mycelex per ID.  4.  Hypothyroidism Continue home dose Synthroid.  5.  Nonalcoholic steatohepatitis/splenomegaly Outpatient follow-up with GI.  6.  Depression/anxiety Stable.  7.  Tobacco abuse Tobacco cessation.  Continue nicotine patch.     DVT prophylaxis: SCDs Code Status: Full Family Communication: Updated patient.  No family at bedside. Disposition Plan: Likely back home once clinically improved and per hematology and ID.   Consultants:   Hematology/oncology: Dr. Alvy Bimler 01/22/2018  Infectious disease: Dr. Johnnye Sima  01/22/2018  Procedures:   Transfusion 1 unit packed red blood cells 01/23/2018  CT abdomen and pelvis 01/22/2018  Chest x-ray 01/22/2018  Antimicrobials:    None   Subjective: Patient denies any bleeding.  Tolerating diet.  Eating lunch.  States number of stools decreasing however still loose.    Objective: Vitals:   01/23/18 1237 01/23/18 2131 01/24/18 0719 01/24/18 1202  BP: (!) 109/47 (!) 123/55 (!) 104/49 (!) 106/47  Pulse: 83 92 88 94  Resp: 19 20 20 16   Temp: 98.4 F (36.9 C) 99.1 F (37.3 C) 99.3 F (37.4 C) 99.3 F (37.4 C)  TempSrc: Oral Oral Oral Oral  SpO2: 99% 100% 97% 98%  Weight:   62.3 kg (137 lb 4.8 oz)   Height:        Intake/Output Summary (Last 24 hours) at 01/24/2018 1253 Last data filed at 01/24/2018 1205 Gross per 24 hour  Intake 2242 ml  Output 1100 ml  Net 1142 ml   Filed Weights   01/22/18 1842 01/23/18 0519 01/24/18 0719  Weight: 60.9 kg (134 lb 3.2 oz) 60.8 kg (134 lb 1.6 oz) 62.3 kg (137 lb 4.8 oz)    Examination:  General exam: Appears calm and comfortable  Respiratory system: Lungs clear to auscultation bilaterally.  No wheezes, no crackles, no rhonchi.  Respiratory effort normal. Cardiovascular system: Regular rate and rhythm no murmurs rubs or gallops.  No JVD.  No lower extremity edema.  Gastrointestinal system: Abdomen is soft, nontender, nondistended, positive bowel sounds.  No rebound.  No guarding.  Central nervous system: Alert and oriented. No focal neurological deficits. Extremities: Symmetric 5 x 5 power. Skin: No rashes, lesions or ulcers Psychiatry: Judgement and insight appear normal. Mood & affect appropriate.     Data Reviewed: I have personally reviewed following labs and imaging studies  CBC: Recent Labs  Lab 01/22/18 0424 01/22/18 1557 01/23/18 0121 01/23/18 1355 01/24/18 0500  WBC 4.6 4.1 4.7  --  3.8*  NEUTROABS 3.6  --   --   --  2.7  HGB 8.1* 7.7* 6.8* 8.5* 7.7*  HCT 24.0* 24.0* 20.6* 24.4* 24.0*  MCV 102.6* 104.3* 105.6*  --  101.3*  PLT 113* 104* 88*  --  85*   Basic Metabolic Panel: Recent Labs  Lab 01/22/18 0424 01/22/18 1557 01/23/18 0121  01/24/18 0500  NA 132*  --  136 135  K 4.0  --  4.1 3.8  CL 100*  --  108 107  CO2 19*  --  21* 19*  GLUCOSE 101*  --  92 97  BUN <5*  --  <5* <5*  CREATININE 0.81 1.33* 0.77 0.80  CALCIUM 8.7*  --  8.1* 8.2*   GFR: Estimated Creatinine Clearance: 73.6 mL/min (by C-G formula based on SCr of 0.8 mg/dL). Liver Function Tests: Recent Labs  Lab 01/22/18 0424 01/23/18 0121 01/24/18 0500  AST 56* 48* 44*  ALT 18 17 17   ALKPHOS 125 115 115  BILITOT 6.6* 5.7* 4.5*  PROT 7.9 6.6 7.1  ALBUMIN 3.1* 2.8* 2.9*   Recent Labs  Lab 01/22/18 0424  LIPASE 23   Recent Labs  Lab 01/22/18 0832  AMMONIA 54*   Coagulation Profile: No results for input(s): INR, PROTIME in the last 168 hours. Cardiac Enzymes: No results for input(s): CKTOTAL, CKMB, CKMBINDEX, TROPONINI in the last 168 hours. BNP (last 3 results) No results for input(s): PROBNP in the last 8760 hours. HbA1C: No results for input(s): HGBA1C in the last 72  hours. CBG: Recent Labs  Lab 01/23/18 0817 01/23/18 1138  GLUCAP 87 102*   Lipid Profile: No results for input(s): CHOL, HDL, LDLCALC, TRIG, CHOLHDL, LDLDIRECT in the last 72 hours. Thyroid Function Tests: Recent Labs    01/22/18 1852  TSH 4.235   Anemia Panel: Recent Labs    01/22/18 1557  RETICCTPCT 6.2*   Sepsis Labs: Recent Labs  Lab 01/22/18 0442 01/22/18 0851  LATICACIDVEN 2.15* 1.07    Recent Results (from the past 240 hour(s))  Blood culture (routine x 2)     Status: None (Preliminary result)   Collection Time: 01/22/18  4:15 AM  Result Value Ref Range Status   Specimen Description BLOOD LEFT ARM  Final   Special Requests   Final    BOTTLES DRAWN AEROBIC AND ANAEROBIC Blood Culture results may not be optimal due to an excessive volume of blood received in culture bottles   Culture   Final    NO GROWTH 1 DAY Performed at [facility], [place] [address] [place], [place] [address]    Report Status PENDING  Incomplete  Blood culture  (routine x 2)     Status: None (Preliminary result)   Collection Time: 01/22/18  4:30 AM  Result Value Ref Range Status   Specimen Description BLOOD LEFT HAND  Final   Special Requests   Final    IN PEDIATRIC BOTTLE Blood Culture results may not be optimal due to an excessive volume of blood received in culture bottles   Culture   Final    NO GROWTH 1 DAY Performed at [facility], [address], [place], [place] [address]    Report Status PENDING  Incomplete  Urine culture     Status: Abnormal   Collection Time: 01/22/18  9:57 AM  Result Value Ref Range Status   Specimen Description URINE, RANDOM  Final   Special Requests   Final    NONE Performed at [facility], [place] [address] [place], [place] [address]    Culture 20,000 COLONIES/mL ENTEROCOCCUS FAECALIS (A)  Final   Report Status 01/24/2018 FINAL  Final   Organism ID, Bacteria ENTEROCOCCUS FAECALIS (A)  Final      Susceptibility   Enterococcus faecalis - MIC*    AMPICILLIN <=2 SENSITIVE Sensitive     LEVOFLOXACIN 1 SENSITIVE Sensitive     NITROFURANTOIN <=16 SENSITIVE Sensitive     VANCOMYCIN 1 SENSITIVE Sensitive     * 20,000 COLONIES/mL ENTEROCOCCUS FAECALIS  Gastrointestinal Panel by PCR , Stool     Status: None   Collection Time: 01/22/18 12:30 PM  Result Value Ref Range Status   Campylobacter species NOT DETECTED NOT DETECTED Final   Plesimonas shigelloides NOT DETECTED NOT DETECTED Final   Salmonella species NOT DETECTED NOT DETECTED Final   Yersinia enterocolitica NOT DETECTED NOT DETECTED Final   Vibrio species NOT DETECTED NOT DETECTED Final   Vibrio cholerae NOT DETECTED NOT DETECTED Final   Enteroaggregative E coli (EAEC) NOT DETECTED NOT DETECTED Final   Enteropathogenic E coli (EPEC) NOT DETECTED NOT DETECTED Final   Enterotoxigenic E coli (ETEC) NOT DETECTED NOT DETECTED Final   Shiga like toxin producing E coli (STEC) NOT DETECTED NOT DETECTED Final   Shigella/Enteroinvasive E coli (EIEC) NOT  DETECTED NOT DETECTED Final   Cryptosporidium NOT DETECTED NOT DETECTED Final   Cyclospora cayetanensis NOT DETECTED NOT DETECTED Final   Entamoeba histolytica NOT DETECTED NOT DETECTED Final   Giardia lamblia NOT DETECTED  NOT DETECTED Final   Adenovirus F40/41 NOT DETECTED NOT DETECTED Final   Astrovirus NOT DETECTED NOT DETECTED Final   Norovirus GI/GII NOT DETECTED NOT DETECTED Final   Rotavirus A NOT DETECTED NOT DETECTED Final   Sapovirus (I, II, IV, and V) NOT DETECTED NOT DETECTED Final    Comment: Performed at [facility], [address] [place], [place] [address]  C difficile quick scan w PCR reflex     Status: Abnormal   Collection Time: 01/22/18 12:30 PM  Result Value Ref Range Status   C Diff antigen POSITIVE (A) NEGATIVE Final   C Diff toxin NEGATIVE NEGATIVE Final   C Diff interpretation Results are indeterminate. See PCR results.  Final    Comment: Performed at [facility], [place] [address] [place], [place] [address]  C. Diff by PCR, Reflexed     Status: None   Collection Time: 01/22/18 12:30 PM  Result Value Ref Range Status   Toxigenic C. Difficile by PCR NEGATIVE NEGATIVE Final    Comment: Patient is colonized with non toxigenic C. difficile. May not need treatment unless significant symptoms are present. Performed at [facility], [place] [address], [place], [place] [address]          Radiology Studies: No results found.      Scheduled Meds: . clotrimazole  10 mg Oral 5 X Daily  . colestipol  1 g Oral BID  . feeding supplement  1 Container Oral TID BM  . folic acid  1 mg Oral Daily  . levothyroxine  75 mcg Oral QAC breakfast  . multivitamin with minerals  1 tablet Oral Daily  . nicotine  21 mg Transdermal Daily  . pregabalin  50 mg Oral TID  . thiamine  100 mg Oral Daily   Continuous Infusions: . sodium chloride       LOS: 2 days    Time spent: 35 minutes    Irine Seal, MD [facility] Pager [phone]  [phone]  If 7PM-7AM, please contact night-coverage [email or web address] Password [email or web address] 01/24/2018, 12:53 PM

## 2018-01-25 ENCOUNTER — Inpatient Hospital Stay (HOSPITAL_COMMUNITY): Payer: Medicaid Other

## 2018-01-25 DIAGNOSIS — K7581 Nonalcoholic steatohepatitis (NASH): Secondary | ICD-10-CM

## 2018-01-25 DIAGNOSIS — D631 Anemia in chronic kidney disease: Secondary | ICD-10-CM

## 2018-01-25 DIAGNOSIS — N189 Chronic kidney disease, unspecified: Secondary | ICD-10-CM

## 2018-01-25 DIAGNOSIS — K7030 Alcoholic cirrhosis of liver without ascites: Secondary | ICD-10-CM

## 2018-01-25 DIAGNOSIS — R140 Abdominal distension (gaseous): Secondary | ICD-10-CM

## 2018-01-25 DIAGNOSIS — B370 Candidal stomatitis: Secondary | ICD-10-CM

## 2018-01-25 DIAGNOSIS — D649 Anemia, unspecified: Secondary | ICD-10-CM

## 2018-01-25 LAB — CBC WITH DIFFERENTIAL/PLATELET
BASOS ABS: 0 10*3/uL (ref 0.0–0.1)
Basophils Relative: 0 %
EOS PCT: 1 %
Eosinophils Absolute: 0 10*3/uL (ref 0.0–0.7)
HEMATOCRIT: 26.1 % — AB (ref 36.0–46.0)
Hemoglobin: 8.3 g/dL — ABNORMAL LOW (ref 12.0–15.0)
LYMPHS ABS: 0.7 10*3/uL (ref 0.7–4.0)
LYMPHS PCT: 19 %
MCH: 32.5 pg (ref 26.0–34.0)
MCHC: 31.8 g/dL (ref 30.0–36.0)
MCV: 102.4 fL — AB (ref 78.0–100.0)
MONOS PCT: 5 %
Monocytes Absolute: 0.2 10*3/uL (ref 0.1–1.0)
NEUTROS ABS: 2.7 10*3/uL (ref 1.7–7.7)
Neutrophils Relative %: 75 %
Platelets: 94 10*3/uL — ABNORMAL LOW (ref 150–400)
RBC: 2.55 MIL/uL — ABNORMAL LOW (ref 3.87–5.11)
RDW: 25 % — ABNORMAL HIGH (ref 11.5–15.5)
WBC: 3.6 10*3/uL — ABNORMAL LOW (ref 4.0–10.5)

## 2018-01-25 LAB — BASIC METABOLIC PANEL
ANION GAP: 11 (ref 5–15)
BUN: <5 mg/dL — ABNORMAL LOW (ref 6–20)
CO2: 20 mmol/L — AB (ref 22–32)
Calcium: 9 mg/dL (ref 8.9–10.3)
Chloride: 103 mmol/L (ref 101–111)
Creatinine, Ser: 0.8 mg/dL (ref 0.44–1.00)
GFR calc non Af Amer: >60 mL/min (ref >60)
GLUCOSE: 94 mg/dL (ref 65–99)
Potassium: 3.4 mmol/L — ABNORMAL LOW (ref 3.5–5.1)
Sodium: 134 mmol/L — ABNORMAL LOW (ref 135–145)

## 2018-01-25 LAB — HEPATIC FUNCTION PANEL
ALBUMIN: 3.2 g/dL — AB (ref 3.5–5.0)
ALT: 18 U/L (ref 14–54)
AST: 44 U/L — ABNORMAL HIGH (ref 15–41)
Alkaline Phosphatase: 121 U/L (ref 38–126)
BILIRUBIN TOTAL: 4.8 mg/dL — AB (ref 0.3–1.2)
Bilirubin, Direct: 1.4 mg/dL — ABNORMAL HIGH (ref 0.1–0.5)
Indirect Bilirubin: 3.4 mg/dL — ABNORMAL HIGH (ref 0.3–0.9)
Total Protein: 7.9 g/dL (ref 6.5–8.1)

## 2018-01-25 LAB — LACTATE DEHYDROGENASE: LDH: 180 U/L (ref 98–192)

## 2018-01-25 MED ORDER — POTASSIUM CHLORIDE CRYS ER 20 MEQ PO TBCR
40.0000 meq | EXTENDED_RELEASE_TABLET | Freq: Once | ORAL | Status: AC
  Administered 2018-01-25: 40 meq via ORAL
  Filled 2018-01-25: qty 2

## 2018-01-25 MED ORDER — SIMETHICONE 80 MG PO CHEW
80.0000 mg | CHEWABLE_TABLET | Freq: Four times a day (QID) | ORAL | Status: DC
  Administered 2018-01-26 (×2): 80 mg via ORAL
  Filled 2018-01-25 (×4): qty 1

## 2018-01-25 MED ORDER — SIMETHICONE 80 MG PO CHEW
80.0000 mg | CHEWABLE_TABLET | Freq: Four times a day (QID) | ORAL | Status: DC
  Administered 2018-01-25 (×2): 80 mg via ORAL
  Filled 2018-01-25 (×2): qty 1

## 2018-01-25 MED ORDER — CLOTRIMAZOLE 10 MG MT TROC
10.0000 mg | Freq: Every day | OROMUCOSAL | Status: DC
  Administered 2018-01-25 – 2018-01-26 (×5): 10 mg via ORAL
  Filled 2018-01-25 (×7): qty 1

## 2018-01-25 MED ORDER — ALUM & MAG HYDROXIDE-SIMETH 200-200-20 MG/5ML PO SUSP
30.0000 mL | ORAL | Status: DC | PRN
  Administered 2018-01-25: 30 mL via ORAL
  Filled 2018-01-25: qty 30

## 2018-01-25 NOTE — Progress Notes (Signed)
I very much appreciate Dr. Alvy Bimler helping out over the weekend.  It is still totally unclear as to what might be going on.  So far, we have not identified any obvious hematologic issue.  Is possible and probable that the "pancytopenia" is from her cirrhosis and splenomegaly.  She had a bone marrow biopsy done that was negative.  She has had a lot of lab work done which again has been unremarkable.  She is being seen by infectious disease.  She tested positive for C. difficile antigen.  I am not sure if this is significant.  Today, her labs not yet back.  Yesterday, hemoglobin was 7.7.  Her platelet count 85,000.  White cell count 3.8.  She does have some cirrhosis.  Her bilirubin is 4.5.  She does have enterococcus in her urine.    She did have a stool transplant back in January.  This was for her refractory C. difficile.  As far as I can tell , she is not bleeding.  I really nothing of any other blood test that we can do for her.  Her erythropoietin level is on the low side.  We have given her Aranesp before.  She does not have iron deficiency.  Again, I have wondered if some of this might not be "self-induced."  I know this would be very hard to improve.  There really is nothing that I can find on her physical exam.   she has been tested for HIV.  This is negative.  She has been tested for hepatitis.  This has been negative  We will follow along.  Lattie Haw, MD  Jeneen Rinks 1:5-7

## 2018-01-25 NOTE — Evaluation (Signed)
Physical Therapy Evaluation [name] MRN: [id number] DOB: Mar 25, 1971 Today's Date: 01/25/2018   History of Present Illness  Pt is a 47 y.o. female with medical history significant for persistent C. difficile colitis,  cirrhosis (NASH), GERD, HTN, anemia, recurrent C diff (s/p of fecal transplant), cocaine abuse in 1990s, tobacco abuse, GERD, hypothyroidism, depression, and anxiety. She presented to the  emergency department with complaints of lower quadrant abdominal pain, nausea, vomiting and loose stools.     Clinical Impression  Pt admitted with above diagnosis. Pt currently with functional limitations due to the deficits listed below (see PT Problem List). PTA pt lived at home with her husband independent with mobility. On eval, she required supervision transfers and in room ambulation.  Pt declined hallway ambulation due to currently being NPO and awaiting testing. Pt very pleasant and excitedly agreeable to hallway ambulation and stair training next session. Pt will benefit from skilled PT to increase their independence and safety with mobility to allow discharge to the venue listed below.  PT to follow acutely. No follow up services indicated.     Follow Up Recommendations No PT follow up    Equipment Recommendations  None recommended by PT    Recommendations for Other Services       Precautions / Restrictions Precautions Precautions: Other (comment) Precaution Comments: hypotensive, watch BP      Mobility  Bed Mobility Overal bed mobility: Independent                Transfers Overall transfer level: Needs assistance Equipment used: None Transfers: Sit to/from Stand Sit to Stand: Supervision         General transfer comment: supervision for safety  Ambulation/Gait Ambulation/Gait assistance: Supervision Ambulation Distance (Feet): 50 Feet Assistive device: None Gait Pattern/deviations: WFL(Within Functional Limits) Gait  velocity: decreased Gait velocity interpretation: Below normal speed for age/gender General Gait Details: steady gait. Limited to in room due to pt request. She is currently NPO awaiting testing.  Stairs            Wheelchair Mobility    Modified Rankin (Stroke Patients Only)       Balance Overall balance assessment: Mild deficits observed, not formally tested                                           Pertinent Vitals/Pain Pain Assessment: Faces Faces Pain Scale: Hurts a little bit Pain Location: abdomen Pain Descriptors / Indicators: Sore Pain Intervention(s): Monitored during session    Home Living Family/patient expects to be discharged to:: Private residence Living Arrangements: Spouse/significant other Available Help at Discharge: Family;Available PRN/intermittently Type of Home: Apartment Home Access: Stairs to enter Entrance Stairs-Rails: Right;Left Entrance Stairs-Number of Steps: 12 Home Layout: One level Home Equipment: Grab bars - tub/shower;Cane - single point;Shower seat      Prior Function Level of Independence: Independent               Hand Dominance   Dominant Hand: Right    Extremity/Trunk Assessment   Upper Extremity Assessment Upper Extremity Assessment: Overall WFL for tasks assessed    Lower Extremity Assessment Lower Extremity Assessment: Overall WFL for tasks assessed    Cervical / Trunk Assessment Cervical / Trunk Assessment: Normal  Communication   Communication: No difficulties  Cognition Arousal/Alertness: Awake/alert Behavior During Therapy: WFL for tasks assessed/performed Overall Cognitive Status: Within  Functional Limits for tasks assessed                                        General Comments      Exercises     Assessment/Plan    PT Assessment Patient needs continued PT services  PT Problem List Decreased mobility;Decreased activity tolerance;Decreased  balance;Pain       PT Treatment Interventions Gait training;Stair training;Functional mobility training;Therapeutic activities;Therapeutic exercise;Balance training;Patient/family education    PT Goals (Current goals can be found in the Care Plan section)  Acute Rehab PT Goals Patient Stated Goal: home PT Goal Formulation: With patient Time For Goal Achievement: 02/08/18 Potential to Achieve Goals: Good    Frequency Min 3X/week   Barriers to discharge        Co-evaluation               AM-PAC PT "6 Clicks" Daily Activity  Outcome Measure Difficulty turning over in bed (including adjusting bedclothes, sheets and blankets)?: None Difficulty moving from lying on back to sitting on the side of the bed? : None Difficulty sitting down on and standing up from a chair with arms (e.g., wheelchair, bedside commode, etc,.)?: None Help needed moving to and from a bed to chair (including a wheelchair)?: None Help needed walking in hospital room?: None Help needed climbing 3-5 steps with a railing? : A Little 6 Click Score: 23    End of Session   Activity Tolerance: Patient tolerated treatment well Patient left: in bed;with call bell/phone within reach Nurse Communication: Mobility status PT Visit Diagnosis: Other abnormalities of gait and mobility (R26.89)    Time: 6237-6283 PT Time Calculation (min) (ACUTE ONLY): 17 min   Charges:   PT Evaluation $PT Eval Low Complexity: 1 Low     PT G Codes:        Jamie Allen, PT  Office # [phone] Pager [phone]   Jamie Allen 01/25/2018, 1:13 PM

## 2018-01-25 NOTE — Progress Notes (Signed)
Radiology called and stated to keep pt NPO for 5 hours for abdominal ultrasound scheduled for 2 pm

## 2018-01-25 NOTE — Progress Notes (Signed)
PROGRESS NOTE    Jamie Allen  OZD:[id number] DOB: [birth year] DOA: 01/22/2018 PCP: Ladell Pier, MD   Brief Narrative:  Patient is a 47 year old female with recurrent admissions for C. difficile infection, history of liver cirrhosis and chronic splenomegaly on imaging study, gastroesophageal reflux disease, hypertension, anemia, prior history of cocaine abuse in the 90s, tobacco abuse, hypothyroidism, depression presented to the ED with lower quadrant abdominal pain nausea vomiting or multiple loose stools.  Patient status post fecal transplant approximately a month ago and subsequently developed fever possible UTI was admitted found to have a fever of unknown origin discharged home and did okay until 2-3 days prior to admission when she presented with his symptoms of abdominal pain nausea vomiting and multiple loose stools.  CT scan was negative for any acute abnormalities.  Chest x-ray had no infiltrate noted.  C. difficile antigen was positive however toxin was negative.  Bilirubin noted to be elevated at 6.6.  Due to concerns for TTP/HUS secondary to schistocytes noted on peripheral smear patient was admitted for further evaluation and management.  Hematology and ID consulted.   Assessment & Plan:   Principal Problem:   Pancytopenia ([place]) Active Problems:   Diarrhea   Anxiety and depression   Hypothyroidism   Postmenopausal syndrome   GERD (gastroesophageal reflux disease)   Obesity (BMI 30-39.9)   Portal hypertensive gastropathy (HCC)   Anemia of chronic disease   Recurrent Clostridium difficile diarrhea   Nonalcoholic steatohepatitis (NASH)   Tobacco abuse   Acute on chronic anemia   Immune deficiency disorder (HCC)   History of blood transfusion   Hemolytic anemia associated with infection (HCC)   1 acute on chronic pancytopenia Patient was admitted with pancytopenia, anemia, elevated bilirubin, schistocytes noted on peripheral smear.  Initial concern was for TTP  or ITP.  Patient was seen in consultation by hematology who felt patient does not have TTP or ITP and schistocytes and would likely secondary to infection/sepsis.  It was felt patient did not need any transfusions unless she we will symptomatic or hemoglobin less than 7.  Patient noted to have a hemoglobin the morning of 01/23/2018 of 6.8 and subsequently transfused a unit of packed red blood cells.  Hemoglobin currently at 8.3.  Platelet count currently at 94 from 85 from 88 from 113 on admission.  Patient with no overt bleeding.  Transfusion threshold platelet count less than 10.  Lovenox have been discontinued.  Per hematology/oncology patient has been worked up fully and thoroughly in the past with no clear etiology.  No further workup at this time..   2.  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  C. difficile PCR was antigen positive, toxin negative.  GI pathogen panel is negative. Patient was seen in consultation by ID who feel patient C. difficile testing is not consistent with an acute infection and no need for antibiotics at this time.  Urine cultures with 20,000 colonies of Enterococcus faecalis, susceptibilities pending.  Patient states increasing frequency of stools and had about 15 loose stools last night.  Patient afebrile.  Normal white count.  No need for antibiotics.  Per ID.    3.  Oral thrush Continue Mycelex per ID.  4.  Hypothyroidism Stable.  Continue Synthroid.   5.  Nonalcoholic steatohepatitis/splenomegaly Outpatient follow-up with GI.  6.  Depression/anxiety Stable.  7.  Tobacco abuse Tobacco cessation.  Continue nicotine patch.     DVT prophylaxis: SCDs Code Status:  Full Family Communication: Updated patient.  No family at bedside. Disposition Plan: Likely back home once clinically improved and per hematology and ID.   Consultants:   Hematology/oncology: Dr. Alvy Bimler 01/22/2018  Infectious disease: Dr. Johnnye Sima  01/22/2018  Procedures:   Transfusion 1 unit packed red blood cells 01/23/2018  CT abdomen and pelvis 01/22/2018  Chest x-ray 01/22/2018  Antimicrobials:   None   Subjective: Patient states had multiple loose BM yesterday evening approx 15. C/o feeling bloated/gassy.  Denies any bleeding.  Tolerating current diet.  No chest pain.  No shortness of breath.    Objective: Vitals:   01/24/18 1202 01/24/18 2005 01/25/18 0438 01/25/18 0800  BP: (!) 106/47 (!) 110/48 (!) 111/49 (!) 102/49  Pulse: 94 84 81 89  Resp: 16 18 18    Temp: 99.3 F (37.4 C) 99 F (37.2 C) 98.7 F (37.1 C)   TempSrc: Oral Oral Oral   SpO2: 98% 98% 98%   Weight:   60.9 kg (134 lb 3.2 oz)   Height:        Intake/Output Summary (Last 24 hours) at 01/25/2018 1041 Last data filed at 01/25/2018 0905 Gross per 24 hour  Intake 770 ml  Output 1900 ml  Net -1130 ml   Filed Weights   01/23/18 0519 01/24/18 0719 01/25/18 0438  Weight: 60.8 kg (134 lb 1.6 oz) 62.3 kg (137 lb 4.8 oz) 60.9 kg (134 lb 3.2 oz)    Examination:  General exam: NAD Respiratory system: Lungs clear to auscultation bilaterally.  No wheezes, no crackles, no rhonchi.  Respiratory effort normal. Cardiovascular system: RRR no m/r/g.  No JVD.  No lower extremity edema.  Gastrointestinal system: Abdomen is nontender, nondistended, soft, positive bowel sounds.  No rebound.  No guarding.  Central nervous system: Alert and oriented. No focal neurological deficits. Extremities: Symmetric 5 x 5 power. Skin: No rashes, lesions or ulcers Psychiatry: Judgement and insight appear normal. Mood & affect appropriate.     Data Reviewed: I have personally reviewed following labs and imaging studies  CBC: Recent Labs  Lab 01/22/18 0424 01/22/18 1557 01/23/18 0121 01/23/18 1355 01/24/18 0500 01/25/18 0621  WBC 4.6 4.1 4.7  --  3.8* 3.6*  NEUTROABS 3.6  --   --   --  2.7 2.7  HGB 8.1* 7.7* 6.8* 8.5* 7.7* 8.3*  HCT 24.0* 24.0* 20.6* 24.4* 24.0* 26.1*    MCV 102.6* 104.3* 105.6*  --  101.3* 102.4*  PLT 113* 104* 88*  --  85* 94*   Basic Metabolic Panel: Recent Labs  Lab 01/22/18 0424 01/22/18 1557 01/23/18 0121 01/24/18 0500 01/25/18 0621  NA 132*  --  136 135 134*  K 4.0  --  4.1 3.8 3.4*  CL 100*  --  108 107 103  CO2 19*  --  21* 19* 20*  GLUCOSE 101*  --  92 97 94  BUN <5*  --  <5* <5* <5*  CREATININE 0.81 1.33* 0.77 0.80 0.80  CALCIUM 8.7*  --  8.1* 8.2* 9.0   GFR: Estimated Creatinine Clearance: 72.7 mL/min (by C-G formula based on SCr of 0.8 mg/dL). Liver Function Tests: Recent Labs  Lab 01/22/18 0424 01/23/18 0121 01/24/18 0500 01/25/18 0621  AST 56* 48* 44* 44*  ALT 18 17 17 18   ALKPHOS 125 115 115 121  BILITOT 6.6* 5.7* 4.5* 4.8*  PROT 7.9 6.6 7.1 7.9  ALBUMIN 3.1* 2.8* 2.9* 3.2*   Recent Labs  Lab 01/22/18 0424  LIPASE 23   Recent  Labs  Lab 01/22/18 0832  AMMONIA 54*   Coagulation Profile: No results for input(s): INR, PROTIME in the last 168 hours. Cardiac Enzymes: No results for input(s): CKTOTAL, CKMB, CKMBINDEX, TROPONINI in the last 168 hours. BNP (last 3 results) No results for input(s): PROBNP in the last 8760 hours. HbA1C: No results for input(s): HGBA1C in the last 72 hours. CBG: Recent Labs  Lab 01/23/18 0817 01/23/18 1138  GLUCAP 87 102*   Lipid Profile: No results for input(s): CHOL, HDL, LDLCALC, TRIG, CHOLHDL, LDLDIRECT in the last 72 hours. Thyroid Function Tests: Recent Labs    01/22/18 1852  TSH 4.235   Anemia Panel: Recent Labs    01/22/18 1557  RETICCTPCT 6.2*   Sepsis Labs: Recent Labs  Lab 01/22/18 0442 01/22/18 0851  LATICACIDVEN 2.15* 1.07    Recent Results (from the past 240 hour(s))  Blood culture (routine x 2)     Status: None (Preliminary result)   Collection Time: 01/22/18  4:15 AM  Result Value Ref Range Status   Specimen Description BLOOD LEFT ARM  Final   Special Requests   Final    BOTTLES DRAWN AEROBIC AND ANAEROBIC Blood Culture  results may not be optimal due to an excessive volume of blood received in culture bottles   Culture   Final    NO GROWTH 2 DAYS Performed at [facility], [place] [address], [place], [place] [address]    Report Status PENDING  Incomplete  Blood culture (routine x 2)     Status: None (Preliminary result)   Collection Time: 01/22/18  4:30 AM  Result Value Ref Range Status   Specimen Description BLOOD LEFT HAND  Final   Special Requests   Final    IN PEDIATRIC BOTTLE Blood Culture results may not be optimal due to an excessive volume of blood received in culture bottles   Culture   Final    NO GROWTH 2 DAYS Performed at [facility], [place] [address], [place], [place] [address]    Report Status PENDING  Incomplete  Urine culture     Status: Abnormal   Collection Time: 01/22/18  9:57 AM  Result Value Ref Range Status   Specimen Description URINE, RANDOM  Final   Special Requests   Final    NONE Performed at [facility], [place] [address] [place], [place] [address]    Culture 20,000 COLONIES/mL ENTEROCOCCUS FAECALIS (A)  Final   Report Status 01/24/2018 FINAL  Final   Organism ID, Bacteria ENTEROCOCCUS FAECALIS (A)  Final      Susceptibility   Enterococcus faecalis - MIC*    AMPICILLIN <=2 SENSITIVE Sensitive     LEVOFLOXACIN 1 SENSITIVE Sensitive     NITROFURANTOIN <=16 SENSITIVE Sensitive     VANCOMYCIN 1 SENSITIVE Sensitive     * 20,000 COLONIES/mL ENTEROCOCCUS FAECALIS  Gastrointestinal Panel by PCR , Stool     Status: None   Collection Time: 01/22/18 12:30 PM  Result Value Ref Range Status   Campylobacter species NOT DETECTED NOT DETECTED Final   Plesimonas shigelloides NOT DETECTED NOT DETECTED Final   Salmonella species NOT DETECTED NOT DETECTED Final   Yersinia enterocolitica NOT DETECTED NOT DETECTED Final   Vibrio species NOT DETECTED NOT DETECTED Final   Vibrio cholerae NOT DETECTED NOT DETECTED Final   Enteroaggregative E coli (EAEC) NOT DETECTED NOT  DETECTED Final   Enteropathogenic E coli (EPEC) NOT DETECTED NOT DETECTED Final   Enterotoxigenic E coli (ETEC)  NOT DETECTED NOT DETECTED Final   Shiga like toxin producing E coli (STEC) NOT DETECTED NOT DETECTED Final   Shigella/Enteroinvasive E coli (EIEC) NOT DETECTED NOT DETECTED Final   Cryptosporidium NOT DETECTED NOT DETECTED Final   Cyclospora cayetanensis NOT DETECTED NOT DETECTED Final   Entamoeba histolytica NOT DETECTED NOT DETECTED Final   Giardia lamblia NOT DETECTED NOT DETECTED Final   Adenovirus F40/41 NOT DETECTED NOT DETECTED Final   Astrovirus NOT DETECTED NOT DETECTED Final   Norovirus GI/GII NOT DETECTED NOT DETECTED Final   Rotavirus A NOT DETECTED NOT DETECTED Final   Sapovirus (I, II, IV, and V) NOT DETECTED NOT DETECTED Final    Comment: Performed at [facility], [place] [place], [place] [address]  C difficile quick scan w PCR reflex     Status: Abnormal   Collection Time: 01/22/18 12:30 PM  Result Value Ref Range Status   C Diff antigen POSITIVE (A) NEGATIVE Final   C Diff toxin NEGATIVE NEGATIVE Final   C Diff interpretation Results are indeterminate. See PCR results.  Final    Comment: Performed at [facility], [place] [address], [place], [place] [address]  C. Diff by PCR, Reflexed     Status: None   Collection Time: 01/22/18 12:30 PM  Result Value Ref Range Status   Toxigenic C. Difficile by PCR NEGATIVE NEGATIVE Final    Comment: Patient is colonized with non toxigenic C. difficile. May not need treatment unless significant symptoms are present. Performed at [facility], [place] [address], [place], [place] [address]          Radiology Studies: No results found.      Scheduled Meds: . clotrimazole  10 mg Oral 5 X Daily  . colestipol  1 g Oral BID  . feeding supplement  1 Container Oral TID BM  . folic acid  1 mg Oral Daily  . levothyroxine  75 mcg Oral QAC breakfast  . multivitamin with minerals  1 tablet Oral  Daily  . nicotine  21 mg Transdermal Daily  . potassium chloride  40 mEq Oral Once  . pregabalin  50 mg Oral TID  . thiamine  100 mg Oral Daily   Continuous Infusions: . sodium chloride       LOS: 3 days    Time spent: 35 minutes    Irine Seal, MD [facility] Pager [phone]  If 7PM-7AM, please contact night-coverage [email or web address] Password [facility] 01/25/2018, 10:41 AM

## 2018-01-25 NOTE — Progress Notes (Signed)
Subjective:  She claims that she had 10+ bowel movements yesterday worsened by when she eats she says when she is not eating food able to gain control of her bowel movements   Antibiotics:  Anti-infectives (From admission, onward)   None      Medications: Scheduled Meds: . clotrimazole  10 mg Oral 5 X Daily  . colestipol  1 g Oral BID  . feeding supplement  1 Container Oral TID BM  . folic acid  1 mg Oral Daily  . levothyroxine  75 mcg Oral QAC breakfast  . multivitamin with minerals  1 tablet Oral Daily  . nicotine  21 mg Transdermal Daily  . pregabalin  50 mg Oral TID  . simethicone  80 mg Oral QID  . thiamine  100 mg Oral Daily   Continuous Infusions: . sodium chloride     PRN Meds:.dicyclomine, ondansetron **OR** ondansetron (ZOFRAN) IV, oxyCODONE    Objective: Weight change:   Intake/Output Summary (Last 24 hours) at 01/25/2018 1113 Last data filed at 01/25/2018 0905 Gross per 24 hour  Intake 770 ml  Output 1900 ml  Net -1130 ml   Blood pressure (!) 102/49, pulse 89, temperature 98.7 F (37.1 C), temperature source Oral, resp. rate 18, height 5\' 1"  (1.549 m), weight 134 lb 3.2 oz (60.9 kg), last menstrual period 06/01/2014, SpO2 98 %. Temp:  [98.7 F (37.1 C)-99.3 F (37.4 C)] 98.7 F (37.1 C) (04/08 0438) Pulse Rate:  [81-94] 89 (04/08 0800) Resp:  [16-18] 18 (04/08 0438) BP: (102-111)/(47-49) 102/49 (04/08 0800) SpO2:  [98 %] 98 % (04/08 0438) Weight:  [134 lb 3.2 oz (60.9 kg)] 134 lb 3.2 oz (60.9 kg) (04/08 0438)  Physical Exam: General: Alert and awake, oriented x3, not in any acute distress. HEENT: anicteric sclera,EOMI, she has some thrush posteriorly CVS regular rate, normal r,  no murmur rubs or gallops Chest:no wheezing, no respiratory distress  abdomen: soft  nondistended, normal bowel sounds, Neuro: nonfocal  CBC:  CBC Latest Ref Rng & Units 01/25/2018 01/24/2018 01/23/2018  WBC 4.0 - 10.5 K/uL 3.6(L) 3.8(L) -  Hemoglobin 12.0 -  15.0 g/dL 8.3(L) 7.7(L) 8.5(L)  Hematocrit 36.0 - 46.0 % 26.1(L) 24.0(L) 24.4(L)  Platelets 150 - 400 K/uL 94(L) 85(L) -      BMET Recent Labs    01/24/18 0500 01/25/18 0621  NA 135 134*  K 3.8 3.4*  CL 107 103  CO2 19* 20*  GLUCOSE 97 94  BUN <5* <5*  CREATININE 0.80 0.80  CALCIUM 8.2* 9.0     Liver Panel  Recent Labs    01/24/18 0500 01/25/18 0621  PROT 7.1 7.9  ALBUMIN 2.9* 3.2*  AST 44* 44*  ALT 17 18  ALKPHOS 115 121  BILITOT 4.5* 4.8*  BILIDIR 1.2* 1.4*  IBILI 3.3* 3.4*       Sedimentation Rate No results for input(s): ESRSEDRATE in the last 72 hours. C-Reactive Protein No results for input(s): CRP in the last 72 hours.  Micro Results: Recent Results (from the past 720 hour(s))  Culture, blood (routine x 2)     Status: None   Collection Time: 01/01/18  4:32 PM  Result Value Ref Range Status   Specimen Description BLOOD RIGHT FOREARM  Final   Special Requests   Final    BOTTLES DRAWN AEROBIC AND ANAEROBIC Blood Culture adequate volume   Culture   Final    NO GROWTH 5 DAYS Performed at [facility], [address] [place], [place] [address]    Report Status 01/06/2018 FINAL  Final  Culture, blood (routine x 2)     Status: None   Collection Time: 01/01/18  4:43 PM  Result Value Ref Range Status   Specimen Description BLOOD LEFT WRIST  Final   Special Requests   Final    BOTTLES DRAWN AEROBIC AND ANAEROBIC Blood Culture adequate volume   Culture   Final    NO GROWTH 5 DAYS Performed at [facility], [place] [address] [place], [place] [address]    Report Status 01/06/2018 FINAL  Final  Urine Culture     Status: Abnormal   Collection Time: 01/01/18 10:32 PM  Result Value Ref Range Status   Specimen Description URINE, RANDOM  Final   Special Requests NONE  Final   Culture (A)  Final    <10,000 COLONIES/mL INSIGNIFICANT GROWTH Performed at [facility], [place] [address] [place], [place] [address]    Report Status 01/03/2018  FINAL  Final  Culture, blood (routine x 2)     Status: None   Collection Time: 01/06/18  4:45 PM  Result Value Ref Range Status   Specimen Description BLOOD RIGHT ANTECUBITAL  Final   Special Requests   Final    BOTTLES DRAWN AEROBIC AND ANAEROBIC Blood Culture adequate volume   Culture   Final    NO GROWTH 5 DAYS Performed at [facility], [place] [address] [place], [place] [address]    Report Status 01/11/2018 FINAL  Final  Culture, blood (routine x 2)     Status: None   Collection Time: 01/06/18  5:00 PM  Result Value Ref Range Status   Specimen Description BLOOD RIGHT FOREARM  Final   Special Requests   Final    BOTTLES DRAWN AEROBIC AND ANAEROBIC Blood Culture adequate volume   Culture   Final    NO GROWTH 5 DAYS Performed at [facility], [place] [address], [place], [place] [address]    Report Status 01/11/2018 FINAL  Final  Culture, Urine     Status: Abnormal   Collection Time: 01/07/18  3:58 AM  Result Value Ref Range Status   Specimen Description URINE, CLEAN CATCH  Final   Special Requests NONE  Final   Culture (A)  Final    <10,000 COLONIES/mL INSIGNIFICANT GROWTH Performed at [facility], [place] [address], [place], [place] [address]    Report Status 01/08/2018 FINAL  Final  Blood culture (routine x 2)     Status: None (Preliminary result)   Collection Time: 01/22/18  4:15 AM  Result Value Ref Range Status   Specimen Description BLOOD LEFT ARM  Final   Special Requests   Final    BOTTLES DRAWN AEROBIC AND ANAEROBIC Blood Culture results may not be optimal due to an excessive volume of blood received in culture bottles   Culture   Final    NO GROWTH 2 DAYS Performed at [facility], [place] [address], [place],  [address]    Report Status PENDING  Incomplete  Blood culture (routine x 2)     Status: None (Preliminary result)   Collection Time: 01/22/18  4:30 AM  Result Value Ref Range Status   Specimen Description BLOOD LEFT HAND  Final    Special Requests   Final    IN PEDIATRIC BOTTLE Blood Culture results may not be optimal due to an excessive volume of blood  received in culture bottles   Culture   Final    NO GROWTH 2 DAYS Performed at [facility], [place] [address] [place], [place] [address]    Report Status PENDING  Incomplete  Urine culture     Status: Abnormal   Collection Time: 01/22/18  9:57 AM  Result Value Ref Range Status   Specimen Description URINE, RANDOM  Final   Special Requests   Final    NONE Performed at [facility], [place] [address] [place], [place] [address]    Culture 20,000 COLONIES/mL ENTEROCOCCUS FAECALIS (A)  Final   Report Status 01/24/2018 FINAL  Final   Organism ID, Bacteria ENTEROCOCCUS FAECALIS (A)  Final      Susceptibility   Enterococcus faecalis - MIC*    AMPICILLIN <=2 SENSITIVE Sensitive     LEVOFLOXACIN 1 SENSITIVE Sensitive     NITROFURANTOIN <=16 SENSITIVE Sensitive     VANCOMYCIN 1 SENSITIVE Sensitive     * 20,000 COLONIES/mL ENTEROCOCCUS FAECALIS  Gastrointestinal Panel by PCR , Stool     Status: None   Collection Time: 01/22/18 12:30 PM  Result Value Ref Range Status   Campylobacter species NOT DETECTED NOT DETECTED Final   Plesimonas shigelloides NOT DETECTED NOT DETECTED Final   Salmonella species NOT DETECTED NOT DETECTED Final   Yersinia enterocolitica NOT DETECTED NOT DETECTED Final   Vibrio species NOT DETECTED NOT DETECTED Final   Vibrio cholerae NOT DETECTED NOT DETECTED Final   Enteroaggregative E coli (EAEC) NOT DETECTED NOT DETECTED Final   Enteropathogenic E coli (EPEC) NOT DETECTED NOT DETECTED Final   Enterotoxigenic E coli (ETEC) NOT DETECTED NOT DETECTED Final   Shiga like toxin producing E coli (STEC) NOT DETECTED NOT DETECTED Final   Shigella/Enteroinvasive E coli (EIEC) NOT DETECTED NOT DETECTED Final   Cryptosporidium NOT DETECTED NOT DETECTED Final   Cyclospora cayetanensis NOT DETECTED NOT DETECTED Final   Entamoeba histolytica NOT  DETECTED NOT DETECTED Final   Giardia lamblia NOT DETECTED NOT DETECTED Final   Adenovirus F40/41 NOT DETECTED NOT DETECTED Final   Astrovirus NOT DETECTED NOT DETECTED Final   Norovirus GI/GII NOT DETECTED NOT DETECTED Final   Rotavirus A NOT DETECTED NOT DETECTED Final   Sapovirus (I, II, IV, and V) NOT DETECTED NOT DETECTED Final    Comment: Performed at [facility], [place] [place], [place] [address]  C difficile quick scan w PCR reflex     Status: Abnormal   Collection Time: 01/22/18 12:30 PM  Result Value Ref Range Status   C Diff antigen POSITIVE (A) NEGATIVE Final   C Diff toxin NEGATIVE NEGATIVE Final   C Diff interpretation Results are indeterminate. See PCR results.  Final    Comment: Performed at [facility], [place] [address], [place], [place] [address]  C. Diff by PCR, Reflexed     Status: None   Collection Time: 01/22/18 12:30 PM  Result Value Ref Range Status   Toxigenic C. Difficile by PCR NEGATIVE NEGATIVE Final    Comment: Patient is colonized with non toxigenic C. difficile. May not need treatment unless significant symptoms are present. Performed at [facility], [place] [address] [place], [place] [address]     Studies/Results: No results found.    Assessment/Plan:  INTERVAL HISTORY: claims to have had 10+ bm yesterday but better now that she is not eating as much food   Principal Problem:   Pancytopenia ([place]) Active Problems:  Diarrhea   Anxiety and depression   Hypothyroidism   Postmenopausal syndrome   GERD (gastroesophageal reflux disease)   Obesity (BMI 30-39.9)   Portal hypertensive gastropathy (HCC)   Anemia of chronic disease   Recurrent Clostridium difficile diarrhea   Nonalcoholic steatohepatitis (NASH)   Tobacco abuse   Acute on chronic anemia   Immune deficiency disorder (HCC)   History of blood transfusion   Hemolytic anemia associated with infection ([place])    Jamie Allen is a 47 y.o. 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  She has had pancytopenia though some of this seems to be chronic in nature.  Her admission CT scan showed no evidence of acute process in the abdomen there is a question of possible hemolytic uremic syndrome based on schistocytes in the blood.  Hematology not believe that she is suffering from HUS.  She grew 20,000 colony-forming units of enterococcus in her urine which is completely irrelevant.  Her stool was sent for GI pathogen panel which was negative and her C. difficile study with antigen positive toxin negative and PCR negative which is not consistent with active infection.  She was only having 3 bowel movements a day last Friday but now claims to having more than 10 bowel movements  1.  Diarrhea: I have asked the nurse to see if we get documentation of how many bowel movements the patient is actually having.  If she is truly having 10+ bowel movements or even more than 5 bowel movements and they are loose and watery we will consider rechecking C. Difficile.  #2 thrush continue clotrimazole for now she seems to be improving symptomatically  3 pancytopenia I suspect much of this has to do with her cirrhotic liver disease  LOS: 3 days   Alcide Evener 01/25/2018, 11:13 AM

## 2018-01-26 ENCOUNTER — Ambulatory Visit: Payer: Self-pay | Admitting: Internal Medicine

## 2018-01-26 DIAGNOSIS — K766 Portal hypertension: Secondary | ICD-10-CM

## 2018-01-26 DIAGNOSIS — E039 Hypothyroidism, unspecified: Secondary | ICD-10-CM

## 2018-01-26 DIAGNOSIS — Z720 Tobacco use: Secondary | ICD-10-CM

## 2018-01-26 DIAGNOSIS — R161 Splenomegaly, not elsewhere classified: Secondary | ICD-10-CM

## 2018-01-26 DIAGNOSIS — K529 Noninfective gastroenteritis and colitis, unspecified: Secondary | ICD-10-CM

## 2018-01-26 DIAGNOSIS — D638 Anemia in other chronic diseases classified elsewhere: Secondary | ICD-10-CM

## 2018-01-26 DIAGNOSIS — R140 Abdominal distension (gaseous): Secondary | ICD-10-CM

## 2018-01-26 DIAGNOSIS — K7469 Other cirrhosis of liver: Secondary | ICD-10-CM

## 2018-01-26 DIAGNOSIS — F329 Major depressive disorder, single episode, unspecified: Secondary | ICD-10-CM

## 2018-01-26 DIAGNOSIS — D696 Thrombocytopenia, unspecified: Secondary | ICD-10-CM

## 2018-01-26 DIAGNOSIS — R197 Diarrhea, unspecified: Secondary | ICD-10-CM

## 2018-01-26 DIAGNOSIS — D72819 Decreased white blood cell count, unspecified: Secondary | ICD-10-CM

## 2018-01-26 DIAGNOSIS — Z9289 Personal history of other medical treatment: Secondary | ICD-10-CM

## 2018-01-26 DIAGNOSIS — A0472 Enterocolitis due to Clostridium difficile, not specified as recurrent: Secondary | ICD-10-CM

## 2018-01-26 DIAGNOSIS — K3189 Other diseases of stomach and duodenum: Secondary | ICD-10-CM

## 2018-01-26 DIAGNOSIS — F419 Anxiety disorder, unspecified: Secondary | ICD-10-CM

## 2018-01-26 DIAGNOSIS — D588 Other specified hereditary hemolytic anemias: Secondary | ICD-10-CM

## 2018-01-26 DIAGNOSIS — N390 Urinary tract infection, site not specified: Secondary | ICD-10-CM

## 2018-01-26 LAB — COMPREHENSIVE METABOLIC PANEL
ALK PHOS: 111 U/L (ref 38–126)
ALT: 16 U/L (ref 14–54)
AST: 43 U/L — ABNORMAL HIGH (ref 15–41)
Albumin: 3 g/dL — ABNORMAL LOW (ref 3.5–5.0)
Anion gap: 8 (ref 5–15)
BUN: <5 mg/dL — ABNORMAL LOW (ref 6–20)
CALCIUM: 9.1 mg/dL (ref 8.9–10.3)
CO2: 20 mmol/L — ABNORMAL LOW (ref 22–32)
CREATININE: 0.75 mg/dL (ref 0.44–1.00)
Chloride: 107 mmol/L (ref 101–111)
Glucose, Bld: 99 mg/dL (ref 65–99)
Potassium: 3.9 mmol/L (ref 3.5–5.1)
Sodium: 135 mmol/L (ref 135–145)
Total Bilirubin: 4.4 mg/dL — ABNORMAL HIGH (ref 0.3–1.2)
Total Protein: 7.2 g/dL (ref 6.5–8.1)

## 2018-01-26 LAB — OCCULT BLOOD X 1 CARD TO LAB, STOOL: Fecal Occult Bld: NEGATIVE

## 2018-01-26 LAB — KAPPA/LAMBDA LIGHT CHAINS
KAPPA FREE LGHT CHN: 102.4 mg/L — AB (ref 3.3–19.4)
Kappa, lambda light chain ratio: 1.65 (ref 0.26–1.65)
Lambda free light chains: 61.9 mg/L — ABNORMAL HIGH (ref 5.7–26.3)

## 2018-01-26 LAB — IRON AND TIBC
Iron: 66 ug/dL (ref 28–170)
SATURATION RATIOS: 33 % — AB (ref 10.4–31.8)
TIBC: 197 ug/dL — ABNORMAL LOW (ref 250–450)
UIBC: 131 ug/dL

## 2018-01-26 LAB — CBC
HCT: 25.3 % — ABNORMAL LOW (ref 36.0–46.0)
Hemoglobin: 8.2 g/dL — ABNORMAL LOW (ref 12.0–15.0)
MCH: 33.1 pg (ref 26.0–34.0)
MCHC: 32.4 g/dL (ref 30.0–36.0)
MCV: 102 fL — ABNORMAL HIGH (ref 78.0–100.0)
PLATELETS: 93 10*3/uL — AB (ref 150–400)
RBC: 2.48 MIL/uL — AB (ref 3.87–5.11)
RDW: 24.4 % — AB (ref 11.5–15.5)
WBC: 3.6 10*3/uL — AB (ref 4.0–10.5)

## 2018-01-26 LAB — FERRITIN: Ferritin: 1027 ng/mL — ABNORMAL HIGH (ref 11–307)

## 2018-01-26 LAB — LACTATE DEHYDROGENASE: LDH: 157 U/L (ref 98–192)

## 2018-01-26 LAB — MAGNESIUM: Magnesium: 1.6 mg/dL — ABNORMAL LOW (ref 1.7–2.4)

## 2018-01-26 MED ORDER — NICOTINE 21 MG/24HR TD PT24: 21.0000 mg | MEDICATED_PATCH | Freq: Every day | TRANSDERMAL | 0 refills | Status: DC

## 2018-01-26 MED ORDER — PANCRELIPASE (LIP-PROT-AMYL) 36000-114000 UNITS PO CPEP: 36000.0000 [IU] | ORAL_CAPSULE | Freq: Three times a day (TID) | ORAL | 0 refills | Status: DC

## 2018-01-26 MED ORDER — THIAMINE HCL 100 MG PO TABS: 100.0000 mg | ORAL_TABLET | Freq: Every day | ORAL | 0 refills | Status: DC

## 2018-01-26 MED ORDER — PANCRELIPASE (LIP-PROT-AMYL) 12000-38000 UNITS PO CPEP
36000.0000 [IU] | ORAL_CAPSULE | Freq: Three times a day (TID) | ORAL | Status: DC
  Administered 2018-01-26 (×2): 36000 [IU] via ORAL
  Filled 2018-01-26 (×2): qty 3

## 2018-01-26 MED ORDER — SIMETHICONE 80 MG PO CHEW: 80.0000 mg | CHEWABLE_TABLET | Freq: Four times a day (QID) | ORAL | 0 refills | Status: DC | PRN

## 2018-01-26 MED ORDER — LOPERAMIDE HCL 2 MG PO TABS: 2.0000 mg | ORAL_TABLET | Freq: Four times a day (QID) | ORAL | 0 refills | Status: DC | PRN

## 2018-01-26 MED ORDER — OXYCODONE HCL 5 MG PO TABS: 5.0000 mg | ORAL_TABLET | Freq: Four times a day (QID) | ORAL | 0 refills | Status: DC | PRN

## 2018-01-26 MED ORDER — FOLIC ACID 1 MG PO TABS: 1.0000 mg | ORAL_TABLET | Freq: Every day | ORAL | 0 refills | Status: DC

## 2018-01-26 MED ORDER — MAGNESIUM SULFATE 4 GM/100ML IV SOLN
4.0000 g | Freq: Once | INTRAVENOUS | Status: AC
  Administered 2018-01-26: 4 g via INTRAVENOUS
  Filled 2018-01-26: qty 100

## 2018-01-26 MED ORDER — ONDANSETRON HCL 4 MG PO TABS: 4.0000 mg | ORAL_TABLET | Freq: Four times a day (QID) | ORAL | 0 refills | Status: DC | PRN

## 2018-01-26 MED ORDER — DARBEPOETIN ALFA 200 MCG/0.4ML IJ SOSY
200.0000 ug | PREFILLED_SYRINGE | INTRAMUSCULAR | Status: DC
  Administered 2018-01-26: 200 ug via SUBCUTANEOUS
  Filled 2018-01-26: qty 0.4

## 2018-01-26 MED ORDER — EPOETIN ALFA 40000 UNIT/ML IJ SOLN: 40000.0000 [IU] | INTRAMUSCULAR | Status: DC

## 2018-01-26 MED ORDER — ADULT MULTIVITAMIN W/MINERALS CH: 1.0000 | ORAL_TABLET | Freq: Every day | ORAL | Status: DC

## 2018-01-26 MED ORDER — CLOTRIMAZOLE 10 MG MT TROC: 10.0000 mg | Freq: Every day | OROMUCOSAL | 0 refills | Status: AC

## 2018-01-26 NOTE — Progress Notes (Signed)
OT Cancellation Note  [name] MRN: [id number] DOB: 06-26-1971   Cancelled Treatment:    Reason Eval/Treat Not Completed: Medical issues which prohibited therapy.  Deferred due to diarrhea.  Will reattempt.  [place], OTR/L [phone]   Lucille Passy M 01/26/2018, 11:53 AM

## 2018-01-26 NOTE — Progress Notes (Signed)
PT Cancellation Note  [name] MRN: [id number] DOB: 1971/08/27   Cancelled Treatment:    Reason Eval/Treat Not Completed: Medical issues which prohibited therapy. Patient deferring therapy at this time, stating, "I was looking so forward to therapy coming to get out of this room but I can't walk right now since I've been having constant diarrhea." Will follow up as schedule allows.  Ellamae Sia, PT, DPT Acute Rehabilitation Services  Pager: [phone]    Willy Eddy 01/26/2018, 11:48 AM

## 2018-01-26 NOTE — Progress Notes (Signed)
Jamie Allen is a little tired this morning.  She said that she had diarrhea all night long.  She had a abdominal ultrasound yesterday.  This showed a normal pancreas.  She had cirrhosis of the liver.  She had splenomegaly.  Her kidneys looked okay.  Her labs today look decent.  Her potassium is 3.9.  Her creatinine is 0.75.  Her albumin is 3.0.  Her white cell count is 3.6.  Hemoglobin 8.2.  Platelet count 93,000.  I will send iron studies on her.  I will start her on some Procrit.  Her erythropoietin level is only 43.  As such, this is low for her degree of anemia.  And she should respond to ESA.  I talked to her about this.  She has had this before.  She is agreeable.  It may not be a bad idea to put her on some Creon.  Maybe she has pancreatic insufficiency.  I would think that if she is having all this diarrhea, she needs a colonoscopy.  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  I thought the point of the stool transplant was to improve her C. difficile and to get her back to having normal bowel movements.  She has a enterococcus UTI.  I cannot imagine that this would be causing issues with her diarrhea.  She has had no temperature.  Her blood pressure is 99/48.  Her abdominal exam shows active bowel sounds.  There is no abdominal distention.  She has no palpable hepatomegaly.  I really cannot feel her spleen tip.  Again, it is totally unclear what is the underlying pathology with [name].  I just do not see that she has a hematologic issue.  I think that her cirrhosis and splenomegaly account for the leukopenia and thrombocytopenia.  Her anemia I think is multifactorial.  I will be out of town for the next 3 days.  If there are any issues, then please call 1 of my colleagues and they will be more than happy to help out.  Lattie Haw, MD  1 Thessalonians 5:16-18

## 2018-01-26 NOTE — Progress Notes (Signed)
Subjective:  She claims that she had 10+ bowel movements again, I examined her bedside commode but this had some formed stool and urine   Antibiotics:  Anti-infectives (From admission, onward)   None      Medications: Scheduled Meds: . clotrimazole  10 mg Oral 5 X Daily  . colestipol  1 g Oral BID  . [START ON 01/27/2018] epoetin alfa  40,000 Units Subcutaneous Once per day on Mon Wed Fri  . feeding supplement  1 Container Oral TID BM  . folic acid  1 mg Oral Daily  . levothyroxine  75 mcg Oral QAC breakfast  . lipase/protease/amylase  36,000 Units Oral TID AC  . multivitamin with minerals  1 tablet Oral Daily  . nicotine  21 mg Transdermal Daily  . pregabalin  50 mg Oral TID  . simethicone  80 mg Oral QID  . thiamine  100 mg Oral Daily   Continuous Infusions: . sodium chloride    . magnesium sulfate 1 - 4 g bolus IVPB     PRN Meds:.alum & mag hydroxide-simeth, dicyclomine, ondansetron **OR** ondansetron (ZOFRAN) IV, oxyCODONE    Objective: Weight change: -5 lb (-2.268 kg)  Intake/Output Summary (Last 24 hours) at 01/26/2018 1010 Last data filed at 01/26/2018 0926 Gross per 24 hour  Intake 960 ml  Output 1040 ml  Net -80 ml   Blood pressure (!) 99/48, pulse 84, temperature 99.1 F (37.3 C), temperature source Oral, resp. rate 18, height 5\' 1"  (1.549 m), weight 132 lb 4.8 oz (60 kg), last menstrual period 06/01/2014, SpO2 93 %. Temp:  [98.8 F (37.1 C)-99.1 F (37.3 C)] 99.1 F (37.3 C) (04/09 0516) Pulse Rate:  [81-87] 84 (04/09 0516) Resp:  [18] 18 (04/09 0516) BP: (99-124)/(48-65) 99/48 (04/09 0516) SpO2:  [93 %-97 %] 93 % (04/09 0516) Weight:  [132 lb 4.8 oz (60 kg)] 132 lb 4.8 oz (60 kg) (04/09 0516)  Physical Exam: General: Alert and awake, oriented x3, not in any acute distress. HEENT: anicteric sclera,EOMI, she has some thrush posteriorly CVS regular rate, normal r,  no murmur rubs or gallops Chest:no wheezing, no respiratory distress    abdomen: soft  nondistended, normal bowel sounds, Neuro: nonfocal  CBC:  CBC Latest Ref Rng & Units 01/26/2018 01/25/2018 01/24/2018  WBC 4.0 - 10.5 K/uL 3.6(L) 3.6(L) 3.8(L)  Hemoglobin 12.0 - 15.0 g/dL 8.2(L) 8.3(L) 7.7(L)  Hematocrit 36.0 - 46.0 % 25.3(L) 26.1(L) 24.0(L)  Platelets 150 - 400 K/uL 93(L) 94(L) 85(L)      BMET Recent Labs    01/25/18 0621 01/26/18 0533  NA 134* 135  K 3.4* 3.9  CL 103 107  CO2 20* 20*  GLUCOSE 94 99  BUN <5* <5*  CREATININE 0.80 0.75  CALCIUM 9.0 9.1     Liver Panel  Recent Labs    01/24/18 0500 01/25/18 0621 01/26/18 0533  PROT 7.1 7.9 7.2  ALBUMIN 2.9* 3.2* 3.0*  AST 44* 44* 43*  ALT 17 18 16   ALKPHOS 115 121 111  BILITOT 4.5* 4.8* 4.4*  BILIDIR 1.2* 1.4*  --   IBILI 3.3* 3.4*  --        Sedimentation Rate No results for input(s): ESRSEDRATE in the last 72 hours. C-Reactive Protein No results for input(s): CRP in the last 72 hours.  Micro Results: Recent Results (from the past 720 hour(s))  Culture, blood (routine x 2)     Status: None   Collection Time: 01/01/18  4:32 PM  Result Value Ref Range Status   Specimen Description BLOOD RIGHT FOREARM  Final   Special Requests   Final    BOTTLES DRAWN AEROBIC AND ANAEROBIC Blood Culture adequate volume   Culture   Final    NO GROWTH 5 DAYS Performed at [facility], [address] [place], [place] [address]    Report Status 01/06/2018 FINAL  Final  Culture, blood (routine x 2)     Status: None   Collection Time: 01/01/18  4:43 PM  Result Value Ref Range Status   Specimen Description BLOOD LEFT WRIST  Final   Special Requests   Final    BOTTLES DRAWN AEROBIC AND ANAEROBIC Blood Culture adequate volume   Culture   Final    NO GROWTH 5 DAYS Performed at [facility], [place] [address] [place], [place] [address]    Report Status 01/06/2018 FINAL  Final  Urine Culture     Status: Abnormal   Collection Time: 01/01/18 10:32 PM  Result Value Ref Range Status    Specimen Description URINE, RANDOM  Final   Special Requests NONE  Final   Culture (A)  Final    <10,000 COLONIES/mL INSIGNIFICANT GROWTH Performed at [facility], [place] [address], [place], [place] [address]    Report Status 01/03/2018 FINAL  Final  Culture, blood (routine x 2)     Status: None   Collection Time: 01/06/18  4:45 PM  Result Value Ref Range Status   Specimen Description BLOOD RIGHT ANTECUBITAL  Final   Special Requests   Final    BOTTLES DRAWN AEROBIC AND ANAEROBIC Blood Culture adequate volume   Culture   Final    NO GROWTH 5 DAYS Performed at [facility], [place] [address] [place], [place] [address]    Report Status 01/11/2018 FINAL  Final  Culture, blood (routine x 2)     Status: None   Collection Time: 01/06/18  5:00 PM  Result Value Ref Range Status   Specimen Description BLOOD RIGHT FOREARM  Final   Special Requests   Final    BOTTLES DRAWN AEROBIC AND ANAEROBIC Blood Culture adequate volume   Culture   Final    NO GROWTH 5 DAYS Performed at [facility], [place] [address] [place], [place] [address]    Report Status 01/11/2018 FINAL  Final  Culture, Urine     Status: Abnormal   Collection Time: 01/07/18  3:58 AM  Result Value Ref Range Status   Specimen Description URINE, CLEAN CATCH  Final   Special Requests NONE  Final   Culture (A)  Final    <10,000 COLONIES/mL INSIGNIFICANT GROWTH Performed at [facility], [place] [address], [place], [place] [address]    Report Status 01/08/2018 FINAL  Final  Blood culture (routine x 2)     Status: None (Preliminary result)   Collection Time: 01/22/18  4:15 AM  Result Value Ref Range Status   Specimen Description BLOOD LEFT ARM  Final   Special Requests   Final    BOTTLES DRAWN AEROBIC AND ANAEROBIC Blood Culture results may not be optimal due to an excessive volume of blood received in culture bottles   Culture   Final    NO GROWTH 3 DAYS Performed at [facility], [place] [address], [place], [place] [address]    Report Status PENDING  Incomplete  Blood culture (routine x 2)  Status: None (Preliminary result)   Collection Time: 01/22/18  4:30 AM  Result Value Ref Range Status   Specimen Description BLOOD LEFT HAND  Final   Special Requests   Final    IN PEDIATRIC BOTTLE Blood Culture results may not be optimal due to an excessive volume of blood received in culture bottles   Culture   Final    NO GROWTH 3 DAYS Performed at [place] [address] [place], [place] [address]    Report Status PENDING  Incomplete  Urine culture     Status: Abnormal   Collection Time: 01/22/18  9:57 AM  Result Value Ref Range Status   Specimen Description URINE, RANDOM  Final   Special Requests   Final    NONE Performed at [facility], [place] [address], [place], [place] [address]    Culture 20,000 COLONIES/mL ENTEROCOCCUS FAECALIS (A)  Final   Report Status 01/24/2018 FINAL  Final   Organism ID, Bacteria ENTEROCOCCUS FAECALIS (A)  Final      Susceptibility   Enterococcus faecalis - MIC*    AMPICILLIN <=2 SENSITIVE Sensitive     LEVOFLOXACIN 1 SENSITIVE Sensitive     NITROFURANTOIN <=16 SENSITIVE Sensitive     VANCOMYCIN 1 SENSITIVE Sensitive     * 20,000 COLONIES/mL ENTEROCOCCUS FAECALIS  Gastrointestinal Panel by PCR , Stool     Status: None   Collection Time: 01/22/18 12:30 PM  Result Value Ref Range Status   Campylobacter species NOT DETECTED NOT DETECTED Final   Plesimonas shigelloides NOT DETECTED NOT DETECTED Final   Salmonella species NOT DETECTED NOT DETECTED Final   Yersinia enterocolitica NOT DETECTED NOT DETECTED Final   Vibrio species NOT DETECTED NOT DETECTED Final   Vibrio cholerae NOT DETECTED NOT DETECTED Final   Enteroaggregative E coli (EAEC) NOT DETECTED NOT DETECTED Final   Enteropathogenic E coli (EPEC) NOT DETECTED NOT DETECTED Final   Enterotoxigenic E coli (ETEC) NOT DETECTED NOT DETECTED Final   Shiga like toxin producing E coli  (STEC) NOT DETECTED NOT DETECTED Final   Shigella/Enteroinvasive E coli (EIEC) NOT DETECTED NOT DETECTED Final   Cryptosporidium NOT DETECTED NOT DETECTED Final   Cyclospora cayetanensis NOT DETECTED NOT DETECTED Final   Entamoeba histolytica NOT DETECTED NOT DETECTED Final   Giardia lamblia NOT DETECTED NOT DETECTED Final   Adenovirus F40/41 NOT DETECTED NOT DETECTED Final   Astrovirus NOT DETECTED NOT DETECTED Final   Norovirus GI/GII NOT DETECTED NOT DETECTED Final   Rotavirus A NOT DETECTED NOT DETECTED Final   Sapovirus (I, II, IV, and V) NOT DETECTED NOT DETECTED Final    Comment: Performed at [facility], [place] [place], [place] [address]  C difficile quick scan w PCR reflex     Status: Abnormal   Collection Time: 01/22/18 12:30 PM  Result Value Ref Range Status   C Diff antigen POSITIVE (A) NEGATIVE Final   C Diff toxin NEGATIVE NEGATIVE Final   C Diff interpretation Results are indeterminate. See PCR results.  Final    Comment: Performed at [facility], [place] [address] [place], [place] [address]  C. Diff by PCR, Reflexed     Status: None   Collection Time: 01/22/18 12:30 PM  Result Value Ref Range Status   Toxigenic C. Difficile by PCR NEGATIVE NEGATIVE Final    Comment: Patient is colonized with non toxigenic C. difficile. May not need treatment unless significant symptoms are present. Performed at [facility], [place] [address] [place], [place] [address]     Studies/Results: US Abdomen Complete  Result Date: 01/25/2018 CLINICAL DATA:  Abdominal pain and distension. History of hepatic cirrhosis and splenomegaly and ascites. EXAM: ABDOMEN ULTRASOUND COMPLETE COMPARISON:  Abdominopelvic CT scan of January 22, 2018 FINDINGS: Gallbladder: The gallbladder surgically absent. Common bile duct: Diameter: 3.9 mm Liver: The surface contour of the liver is irregular. There is no discrete mass. There is a small amount of intrahepatic ductal dilation. Portal  vein is patent on color Doppler imaging with normal direction of blood flow towards the liver. IVC: No abnormality visualized. Pancreas: Normal in size and echotexture. Spleen: The spleen is enlarged measuring 19.8 x 20.1 x 9.2 cm with a calculated volume of 1922 cc. Right Kidney: Length: 12.3 cm. Echogenicity within normal limits. No mass or hydronephrosis visualized. Left Kidney: Length: 9.7 cm. There is renal cortical atrophy. The cortical echotexture is slightly greater than that of the right kidney. No mass or hydronephrosis visualized. Abdominal aorta: Visualization of the abdominal aorta was limited by bowel gas. Other findings: There was a small amount of perihepatic ascites. IMPRESSION: Cirrhotic changes within the liver. No suspicious masses. Marked splenomegaly with calculated volume of 1922 cc. Chronic right renal cortical atrophy. Electronically Signed   By: David  Martinique M.D.   On: 01/25/2018 15:13      Assessment/Plan:  INTERVAL HISTORY: Had multiple stools overnight yet again.  Principal Problem:   Pancytopenia ([place]) Active Problems:   Diarrhea   Anxiety and depression   Hypothyroidism   Postmenopausal syndrome   GERD (gastroesophageal reflux disease)   Obesity (BMI 30-39.9)   Portal hypertensive gastropathy (HCC)   Anemia of chronic disease   Recurrent Clostridium difficile diarrhea   Nonalcoholic steatohepatitis ([facility])   Tobacco abuse   Acute on chronic anemia   Immune deficiency disorder (HCC)   History of blood transfusion   Hemolytic anemia associated with infection (HCC)   Abdominal distension    Jamie Allen is a 47 y.o. 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  She has had pancytopenia though some of this seems to be chronic in nature.  Her admission CT scan showed no evidence of acute process in the abdomen there is a question of possible hemolytic  uremic syndrome based on schistocytes in the blood.  Hematology not believe that she is suffering from HUS.  She grew 20,000 colony-forming units of enterococcus in her urine which is completely irrelevant.  Her stool was sent for GI pathogen panel which was negative and her C. difficile study with antigen positive toxin negative and PCR negative which is not consistent with active infection.  She was only having 3 bowel movements a day last Friday but now claims to having more than 10 bowel movements  1.  Diarrhea: Case discussed with Dr. Carlyle Basques and pt having a NON TOXIGENIC CDIFF is actually likely PROTECTIVE vs TOXIGENIC C DIFF  NO need for further infectious workup  One could discuss with GI and could institute imodium or lomotil if she is actually having diarrhea  #2 thrush continue clotrimazole for now she seems to be improving symptomatically  3 pancytopenia I suspect much of this has to do with her cirrhotic liver disease  I will sign off for now  Please call with further questions.   LOS: 4 days   Alcide Evener 01/26/2018, 10:10 AM

## 2018-01-26 NOTE — Progress Notes (Signed)
Reviewed discharge instructions/medications with patient. Answered her questions. Pt is stable and ready for discharge. Waiting on her ride.

## 2018-01-26 NOTE — Discharge Summary (Signed)
Physician Discharge Summary  Jamie Allen PQZ:[id number] DOB: September 01, 1971 DOA: 01/22/2018  PCP: Ladell Pier, MD  Admit date: 01/22/2018 Discharge date: 01/26/2018  Time spent: 55 minutes  Recommendations for Outpatient Follow-up:  1. Follow-up with Ladell Pier, MD in 1-2 weeks.  On follow-up patient will need a basic metabolic profile done to follow-up on electrolytes and renal function.  Patient may benefit from referral to outpatient pain management clinic. 2. Follow-up with Dr. Marin Olp, hematology/oncology in 1-2 weeks.  On follow-up patient will need a CBC with differential done to follow-up on counts.  Patient will also may need resumption of Procrit.   Discharge Diagnoses:  Principal Problem:   Pancytopenia ([place]) Active Problems:   Diarrhea   Anxiety and depression   Hypothyroidism   Postmenopausal syndrome   GERD (gastroesophageal reflux disease)   Obesity (BMI 30-39.9)   Portal hypertensive gastropathy (HCC)   Anemia of chronic disease   Recurrent Clostridium difficile diarrhea   Nonalcoholic steatohepatitis ([facility])   Tobacco abuse   Acute on chronic anemia   Immune deficiency disorder (HCC)   History of blood transfusion   Hemolytic anemia associated with infection (HCC)   Abdominal distension   Other cirrhosis of liver ([place])   Discharge Condition: Stable and improved  Diet recommendation: Heart healthy  Filed Weights   01/24/18 0719 01/25/18 0438 01/26/18 0516  Weight: 62.3 kg (137 lb 4.8 oz) 60.9 kg (134 lb 3.2 oz) 60 kg (132 lb 4.8 oz)    History of present illness:  Per Dr. Verlan Friends Jamie Allen is a 47 y.o. female with medical history significant persistent C. difficile colitis, cirrhosis ([facility]), GERD, HTN, Anemia, andrecurrent C diff(s/p offecal transplant),cocaine abuse in 1990s, tobacco abuse, GERD, hypothyroidism, depression, anxiety resents emergency department today with complaints of lower quadrant abdominal pain nausea  vomiting and loose stools.  The patient has a history of C. difficile infection 3 times starting 4-1/2 years ago.  She was most recently undergone a fecal transplant last month subsequent to that she developed a fever and a possible UTI and was admitted to the hospital was found to have a fever of unknown origin discharged home and did fairly okay until 2-3 days ago when the symptoms began.  In the emergency department she was fully evaluated she had a CT scan of the abdomen which was negative and urinalysis which was unremarkable, chest x-ray which showed no pneumonia, and a C. difficile antigen that was positive and a toxin that was negative.  PCR is pending.  Her bilirubin was noted to be elevated at 6.6 and a pathologist called the emergency department physician with concerns about the patient having TTP/HUS due to schistocytes noted on her peripheral smear.  Despite all these problems patient looked fairly well in the emergency department she is sitting up and talking to me and able to give me a complete history.  She was very straightforward and honest and was a pleasure to interview.  Reported that she had just been feeling terribly over the past few days and feels like she cannot shake this thing.  It was quite concerning to her in the multiple episodes of diarrhea or really causing her lots of problems.  She stated that her original episode of C. difficile took months to diagnose.  She denied headache, blurry vision, double vision,.  Rashes, masses, nodules, shortness of breath, chest pain palpitations or sputum production and cough, she has no weakness or localized weakness or mentation issues.  She  will be admitted into the hospital for further evaluation and management of her recurrent abdominal pain, nausea vomiting and loose stools.    Hospital Course:  1 acute on chronic pancytopenia Patient was admitted with pancytopenia, anemia, elevated bilirubin, schistocytes noted on peripheral smear.   Likely secondary to cirrhosis. Initial concern was for TTP or ITP.  Patient was seen in consultation by hematology who felt patient does not have TTP or ITP and schistocytes and would likely secondary to infection/sepsis.  It was felt patient did not need any transfusions unless she was symptomatic or hemoglobin less than 7.  Patient noted to have a hemoglobin the morning of 01/23/2018 of 6.8 and subsequently transfused a unit of packed red blood cells.  Hemoglobin stabilized at 8.2.   Platelet count stabilized at 93 from 94 from 85 from 88 from 113 on admission.  Patient with no overt bleeding.  Transfusion threshold platelet count less than 10.  Lovenox have been discontinued.  Per hematology/oncology patient has been worked up fully and thoroughly in the past with no clear etiology.  No further workup at this time per hematology.  Per hematology erythropoietin level noted at 43 and patient started on Procrit.  Outpatient follow-up with hematology.  2.  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  C. difficile PCR was antigen positive, toxin negative.  GI pathogen panel was negative. Patient was seen in consultation by ID who feel patient C. difficile testing is not consistent with an acute infection and no need for antibiotics at this time.  Urine cultures with 20,000 colonies of Enterococcus faecalis, susceptibilities pending.  Patient stated increasing frequency of stools although number was not correctly recorded.  Patient remained afebrile.  Patient noted to have a normal white count.  Patient will be placed on Imodium as needed.  Patient also started on Creon by hematology oncology due to concerns for possible pancreatic insufficiency.  Outpatient follow-up with PCP.    3.  Oral thrush Patient was started on Mycelex per ID will be discharged on 5 more days to complete a course of treatment.  4.  Hypothyroidism Stable.  Maintained on home regimen  of Synthroid.   5.  Nonalcoholic steatohepatitis/splenomegaly Outpatient follow-up with GI.  6.  Depression/anxiety Stable.  7.  Tobacco abuse Tobacco cessation.  Patient placed on a nicotine patch.  Outpatient follow-up.     Procedures:  Transfusion 1 unit packed red blood cells 01/23/2018  CT abdomen and pelvis 01/22/2018  Chest x-ray 01/22/2018  Abdominal ultrasound 01/25/2018      Consultations:  Hematology/oncology: Dr. Alvy Bimler 01/22/2018  Infectious disease: Dr. Johnnye Sima 01/22/2018      Discharge Exam: Vitals:   01/26/18 1024 01/26/18 1153  BP: (!) 107/43 (!) 97/51  Pulse: 77 71  Resp:  20  Temp:  98 F (36.7 C)  SpO2: 99% 96%    General: NAD Cardiovascular: RRR Respiratory: CTAB GI: Mildly distended, soft, less tender to palpation, positive bowel sounds.  Discharge Instructions   Discharge Instructions    Diet - low sodium heart healthy   Complete by:  As directed    Increase activity slowly   Complete by:  As directed      Allergies as of 01/26/2018      Reactions   Iohexol Hives, Itching, Swelling   Lactose Intolerance (gi) Diarrhea, Nausea Only   Other Other (See Comments)   Spicy foods "tear up my stomach"   Vancomycin Itching   Nose  itching   Morphine And Related Itching, Other (See Comments)   Can tolerate with Benadryl      Medication List    TAKE these medications   bismuth subsalicylate 161 WR/60AV suspension Commonly known as:  PEPTO BISMOL Take 30 mLs by mouth every 6 (six) hours as needed for diarrhea or loose stools.   clotrimazole 10 MG troche Commonly known as:  MYCELEX Take 1 tablet (10 mg total) by mouth 5 (five) times daily for 5 days.   colestipol 1 g tablet Commonly known as:  COLESTID Take 1 tablet (1 g total) by mouth 2 (two) times daily.   dicyclomine 10 MG capsule Commonly known as:  BENTYL Take 1 capsule (10 mg total) by mouth 3 (three) times daily before meals. What changed:  when to take this    diphenhydrAMINE 25 MG tablet Commonly known as:  BENADRYL Take 25 mg by mouth daily.   esomeprazole 20 MG capsule Commonly known as:  NEXIUM Take 20 mg by mouth daily at 12 noon.   folic acid 1 MG tablet Commonly known as:  FOLVITE Take 1 tablet (1 mg total) by mouth daily.   hydrOXYzine 10 MG tablet Commonly known as:  ATARAX/VISTARIL Take 1 tablet (10 mg total) by mouth 2 (two) times daily.   levothyroxine 75 MCG tablet Commonly known as:  SYNTHROID, LEVOTHROID Take 1 tablet (75 mcg total) by mouth daily before breakfast.   lipase/protease/amylase 36000 UNITS Cpep capsule Commonly known as:  CREON Take 1 capsule (36,000 Units total) by mouth 3 (three) times daily before meals.   loperamide 2 MG tablet Commonly known as:  IMODIUM A-D Take 1 tablet (2 mg total) by mouth 4 (four) times daily as needed for diarrhea or loose stools. What changed:    when to take this  reasons to take this   Melatonin 10 MG Tabs Take 10 mg by mouth at bedtime as needed (for sleep).   multivitamin with minerals Tabs tablet Take 1 tablet by mouth daily. What changed:  Another medication with the same name was added. Make sure you understand how and when to take each.   multivitamin with minerals Tabs tablet Take 1 tablet by mouth daily. Start taking on:  01/27/2018 What changed:  You were already taking a medication with the same name, and this prescription was added. Make sure you understand how and when to take each.   nicotine 21 mg/24hr patch Commonly known as:  NICODERM CQ - dosed in mg/24 hours Place 1 patch (21 mg total) onto the skin daily.   nicotine polacrilex 2 MG gum Commonly known as:  NICORETTE Take 2 mg by mouth 3 (three) times daily.   ondansetron 4 MG tablet Commonly known as:  ZOFRAN Take 1 tablet (4 mg total) by mouth every 6 (six) hours as needed for nausea.   oxyCODONE 5 MG immediate release tablet Commonly known as:  Oxy IR/ROXICODONE Take 1 tablet (5 mg  total) by mouth every 6 (six) hours as needed for moderate pain.   pregabalin 50 MG capsule Commonly known as:  LYRICA Take 1 capsule (50 mg total) by mouth 3 (three) times daily.   simethicone 80 MG chewable tablet Commonly known as:  MYLICON Chew 1 tablet (80 mg total) by mouth 4 (four) times daily as needed for flatulence.   thiamine 100 MG tablet Take 1 tablet (100 mg total) by mouth daily.   VITAMIN D PO Take 1 capsule by mouth daily.  Allergies  Allergen Reactions  . Iohexol Hives, Itching and Swelling  . Lactose Intolerance (Gi) Diarrhea and Nausea Only  . Other Other (See Comments)    Spicy foods "tear up my stomach"  . Vancomycin Itching    Nose itching  . Morphine And Related Itching and Other (See Comments)    Can tolerate with Benadryl   Follow-up Information    Ladell Pier, MD. Schedule an appointment as soon as possible for a visit in 1 week(s).   Specialty:  Internal Medicine Why:  f/u in 1-2 weeks. Contact information: [place] [address] [phone]        Skeet Latch, MD .   Specialty:  Cardiology Contact information: [address] [place] [address] [phone]        Volanda Napoleon, MD. Schedule an appointment as soon as possible for a visit in 1 week(s).   Specialty:  Oncology Why:  f/u in 1-2 weeks. Contact information: [place]  [address] [phone]            The results of significant diagnostics from this hospitalization (including imaging, microbiology, ancillary and laboratory) are listed below for reference.    Significant Diagnostic Studies: Ct Abdomen Pelvis Wo Contrast  Result Date: 01/22/2018 CLINICAL DATA:  Left lower quadrant abdominal pain with fever and vomiting. History of cirrhosis EXAM: CT ABDOMEN AND PELVIS WITHOUT CONTRAST TECHNIQUE: Multidetector CT imaging of the abdomen and pelvis was performed following the  standard protocol without IV contrast. COMPARISON:  12/24/2017 FINDINGS: Lower chest: Bibasilar atelectasis noted. Normal heart size. No pericardial or pleural effusion. No acute lower chest finding. Hepatobiliary: Liver is enlarged measuring 19 cm in length. Nodularity to the liver surface compatible with underlying cirrhosis. Remote cholecystectomy. No biliary dilatation. No large focal abnormality of the liver by noncontrast imaging. Pancreas: No focal abnormality or ductal dilatation by noncontrast imaging. Spleen: Spleen is also enlarged as before measuring 16.7 cm in length. Adrenals/Urinary Tract: Normal adrenal glands. Atrophy of the left kidney appearing chronic. No renal obstruction or hydronephrosis. No hydroureter or obstructing ureteral calculus. Bladder unremarkable. Stomach/Bowel: Negative for bowel obstruction, significant dilatation, ileus, or free air. Normal appendix containing contrast. No significant bowel wall thickening. No fluid collection or abscess. Vascular/Lymphatic: Aortic atherosclerosis noted. Negative for aneurysm. No adenopathy. No retroperitoneal abnormality hemorrhage. Reproductive: Uterus and adnexa normal in size. No pelvic free fluid or fluid collection. Other: No abdominal wall hernia or abnormality. No abdominopelvic ascites. Musculoskeletal: No acute or significant osseous findings. IMPRESSION: No acute abdominal or pelvic finding by noncontrast CT. Hepatomegaly and evidence of hepatic cirrhosis Associated splenomegaly Chronic left renal atrophy Remote cholecystectomy Atherosclerosis without aneurysm Electronically Signed   By: Jerilynn Mages.  Shick M.D.   On: 01/22/2018 12:29   Dg Chest 2 View  Result Date: 01/22/2018 CLINICAL DATA:  Fever and cough. EXAM: CHEST - 2 VIEW COMPARISON:  01/01/2018 FINDINGS: Lateral view degraded by patient arm position. Midline trachea.  Normal heart size and mediastinal contours. Sharp costophrenic angles. No pneumothorax. mild subsegmental atelectasis  or scar at the left lung base. IMPRESSION: No active cardiopulmonary disease. Electronically Signed   By: Abigail Miyamoto M.D.   On: 01/22/2018 09:10   US Abdomen Complete  Result Date: 01/25/2018 CLINICAL DATA:  Abdominal pain and distension. History of hepatic cirrhosis and splenomegaly and ascites. EXAM: ABDOMEN ULTRASOUND COMPLETE COMPARISON:  Abdominopelvic CT scan of January 22, 2018 FINDINGS: Gallbladder: The gallbladder surgically absent. Common  bile duct: Diameter: 3.9 mm Liver: The surface contour of the liver is irregular. There is no discrete mass. There is a small amount of intrahepatic ductal dilation. Portal vein is patent on color Doppler imaging with normal direction of blood flow towards the liver. IVC: No abnormality visualized. Pancreas: Normal in size and echotexture. Spleen: The spleen is enlarged measuring 19.8 x 20.1 x 9.2 cm with a calculated volume of 1922 cc. Right Kidney: Length: 12.3 cm. Echogenicity within normal limits. No mass or hydronephrosis visualized. Left Kidney: Length: 9.7 cm. There is renal cortical atrophy. The cortical echotexture is slightly greater than that of the right kidney. No mass or hydronephrosis visualized. Abdominal aorta: Visualization of the abdominal aorta was limited by bowel gas. Other findings: There was a small amount of perihepatic ascites. IMPRESSION: Cirrhotic changes within the liver. No suspicious masses. Marked splenomegaly with calculated volume of 1922 cc. Chronic right renal cortical atrophy. Electronically Signed   By: David  Martinique M.D.   On: 01/25/2018 15:13   US Abdomen Limited  Result Date: 01/03/2018 CLINICAL DATA:  Abdominal distension EXAM: LIMITED ABDOMEN ULTRASOUND FOR ASCITES TECHNIQUE: Limited ultrasound survey for ascites was performed in all four abdominal quadrants. COMPARISON:  December 28, 2017 abdominal examination and CT abdomen and pelvis December 24, 2017 FINDINGS: There is a rather minimal amount of ascites seen in the left upper  and lower quadrant regions. Multiple loops of peristalsing bowel are noted throughout the abdomen pelvis. IMPRESSION: Rather minimal ascites on the left. Electronically Signed   By: Lowella Grip III M.D.   On: 01/03/2018 21:48   US Abdomen Limited  Result Date: 12/28/2017 CLINICAL DATA:  Abdominal distension EXAM: LIMITED ABDOMEN ULTRASOUND FOR ASCITES TECHNIQUE: Limited ultrasound survey for ascites was performed in all four abdominal quadrants. COMPARISON:  None. FINDINGS: Minimal free fluid is noted surrounding the falciform ligament. No sizable amount of ascites is noted. IMPRESSION: Minimal free fluid in the abdomen. Electronically Signed   By: Inez Catalina M.D.   On: 12/28/2017 19:44   Dg Chest Port 1 View  Result Date: 01/01/2018 CLINICAL DATA:  Fever today with dyspnea EXAM: PORTABLE CHEST 1 VIEW COMPARISON:  12/23/2017 FINDINGS: The heart size and mediastinal contours are within normal limits. Atelectasis and/or scarring is seen at the left lung base. No pulmonary consolidation nor overt pulmonary edema. The visualized skeletal structures are unremarkable. IMPRESSION: No active disease. Electronically Signed   By: Ashley Royalty M.D.   On: 01/01/2018 19:16   Dg Abd Portable 1v  Result Date: 12/28/2017 CLINICAL DATA:  Distended abdomen EXAM: PORTABLE ABDOMEN - 1 VIEW COMPARISON:  CT 12/24/2017 FINDINGS: Normal bowel gas pattern. Surgical clips in the gallbladder fossa. Foley catheter. No renal calculi. IMPRESSION: Negative. Electronically Signed   By: Franchot Gallo M.D.   On: 12/28/2017 12:54   Ct Bone Marrow Biopsy & Aspiration  Result Date: 12/30/2017 INDICATION: Cirrhosis, chronic anemia EXAM: CT GUIDED RIGHT ILIAC BONE MARROW ASPIRATION AND CORE BIOPSY Date:  12/30/2017 12/30/2017 11:40 am Radiologist:  Jerilynn Mages. Daryll Brod, MD Guidance:  CT FLUOROSCOPY TIME:  Fluoroscopy Time: NONE. MEDICATIONS: None. ANESTHESIA/SEDATION: 2.0 mg IV Versed; 150 mcg IV Fentanyl Moderate Sedation Time:  9  minutes The patient was continuously monitored during the procedure by the interventional radiology nurse under my direct supervision. CONTRAST:  None. COMPLICATIONS: None PROCEDURE: Informed consent was obtained from the patient following explanation of the procedure, risks, benefits and alternatives. The patient understands, agrees and consents for the procedure. All questions were addressed.  A time out was performed. The patient was positioned prone and non-contrast localization CT was performed of the pelvis to demonstrate the iliac marrow spaces. Maximal barrier sterile technique utilized including caps, mask, sterile gowns, sterile gloves, large sterile drape, hand hygiene, and Betadine prep. Under sterile conditions and local anesthesia, an 11 gauge coaxial bone biopsy needle was advanced into the right iliac marrow space. Needle position was confirmed with CT imaging. Initially, bone marrow aspiration was performed. Next, the 11 gauge outer cannula was utilized to obtain a right iliac bone marrow core biopsy. Needle was removed. Hemostasis was obtained with compression. The patient tolerated the procedure well. Samples were prepared with the cytotechnologist. No immediate complications. IMPRESSION: CT guided right iliac bone marrow aspiration and core biopsy. Electronically Signed   By: Jerilynn Mages.  Shick M.D.   On: 12/30/2017 11:43    Microbiology: Recent Results (from the past 240 hour(s))  Blood culture (routine x 2)     Status: None (Preliminary result)   Collection Time: 01/22/18  4:15 AM  Result Value Ref Range Status   Specimen Description BLOOD LEFT ARM  Final   Special Requests   Final    BOTTLES DRAWN AEROBIC AND ANAEROBIC Blood Culture results may not be optimal due to an excessive volume of blood received in culture bottles   Culture   Final    NO GROWTH 4 DAYS Performed at [facility], [place] [address] [place], [place] [address]    Report Status PENDING  Incomplete  Blood culture  (routine x 2)     Status: None (Preliminary result)   Collection Time: 01/22/18  4:30 AM  Result Value Ref Range Status   Specimen Description BLOOD LEFT HAND  Final   Special Requests   Final    IN PEDIATRIC BOTTLE Blood Culture results may not be optimal due to an excessive volume of blood received in culture bottles   Culture   Final    NO GROWTH 4 DAYS Performed at [facility], [place] [address] [place], [place] [address]    Report Status PENDING  Incomplete  Urine culture     Status: Abnormal   Collection Time: 01/22/18  9:57 AM  Result Value Ref Range Status   Specimen Description URINE, RANDOM  Final   Special Requests   Final    NONE Performed at [facility], [place] [address] [place], [place] [address]    Culture 20,000 COLONIES/mL ENTEROCOCCUS FAECALIS (A)  Final   Report Status 01/24/2018 FINAL  Final   Organism ID, Bacteria ENTEROCOCCUS FAECALIS (A)  Final      Susceptibility   Enterococcus faecalis - MIC*    AMPICILLIN <=2 SENSITIVE Sensitive     LEVOFLOXACIN 1 SENSITIVE Sensitive     NITROFURANTOIN <=16 SENSITIVE Sensitive     VANCOMYCIN 1 SENSITIVE Sensitive     * 20,000 COLONIES/mL ENTEROCOCCUS FAECALIS  Gastrointestinal Panel by PCR , Stool     Status: None   Collection Time: 01/22/18 12:30 PM  Result Value Ref Range Status   Campylobacter species NOT DETECTED NOT DETECTED Final   Plesimonas shigelloides NOT DETECTED NOT DETECTED Final   Salmonella species NOT DETECTED NOT DETECTED Final   Yersinia enterocolitica NOT DETECTED NOT DETECTED Final   Vibrio species NOT DETECTED NOT DETECTED Final   Vibrio cholerae NOT DETECTED NOT DETECTED Final   Enteroaggregative E coli (EAEC) NOT DETECTED NOT DETECTED Final   Enteropathogenic E coli (EPEC) NOT DETECTED NOT DETECTED Final  Enterotoxigenic E coli (ETEC) NOT DETECTED NOT DETECTED Final   Shiga like toxin producing E coli (STEC) NOT DETECTED NOT DETECTED Final   Shigella/Enteroinvasive E coli (EIEC)  NOT DETECTED NOT DETECTED Final   Cryptosporidium NOT DETECTED NOT DETECTED Final   Cyclospora cayetanensis NOT DETECTED NOT DETECTED Final   Entamoeba histolytica NOT DETECTED NOT DETECTED Final   Giardia lamblia NOT DETECTED NOT DETECTED Final   Adenovirus F40/41 NOT DETECTED NOT DETECTED Final   Astrovirus NOT DETECTED NOT DETECTED Final   Norovirus GI/GII NOT DETECTED NOT DETECTED Final   Rotavirus A NOT DETECTED NOT DETECTED Final   Sapovirus (I, II, IV, and V) NOT DETECTED NOT DETECTED Final    Comment: Performed at [facility], [place] [place], [place] [address]  C difficile quick scan w PCR reflex     Status: Abnormal   Collection Time: 01/22/18 12:30 PM  Result Value Ref Range Status   C Diff antigen POSITIVE (A) NEGATIVE Final   C Diff toxin NEGATIVE NEGATIVE Final   C Diff interpretation Results are indeterminate. See PCR results.  Final    Comment: Performed at [facility], [place] [address], [place], [place] [address]  C. Diff by PCR, Reflexed     Status: None   Collection Time: 01/22/18 12:30 PM  Result Value Ref Range Status   Toxigenic C. Difficile by PCR NEGATIVE NEGATIVE Final    Comment: Patient is colonized with non toxigenic C. difficile. May not need treatment unless significant symptoms are present. Performed at [facility], [place] [address], [place],  [address]      Labs: Basic Metabolic Panel: Recent Labs  Lab 01/22/18 0424 01/22/18 1557 01/23/18 0121 01/24/18 0500 01/25/18 0621 01/26/18 0533  NA 132*  --  136 135 134* 135  K 4.0  --  4.1 3.8 3.4* 3.9  CL 100*  --  108 107 103 107  CO2 19*  --  21* 19* 20* 20*  GLUCOSE 101*  --  92 97 94 99  BUN <5*  --  <5* <5* <5* <5*  CREATININE 0.81 1.33* 0.77 0.80 0.80 0.75  CALCIUM 8.7*  --  8.1* 8.2* 9.0 9.1  MG  --   --   --   --   --  1.6*   Liver Function Tests: Recent Labs  Lab 01/22/18 0424 01/23/18 0121 01/24/18 0500 01/25/18 0621 01/26/18 0533  AST 56* 48*  44* 44* 43*  ALT 18 17 17 18 16   ALKPHOS 125 115 115 121 111  BILITOT 6.6* 5.7* 4.5* 4.8* 4.4*  PROT 7.9 6.6 7.1 7.9 7.2  ALBUMIN 3.1* 2.8* 2.9* 3.2* 3.0*   Recent Labs  Lab 01/22/18 0424  LIPASE 23   Recent Labs  Lab 01/22/18 0832  AMMONIA 54*   CBC: Recent Labs  Lab 01/22/18 0424 01/22/18 1557 01/23/18 0121 01/23/18 1355 01/24/18 0500 01/25/18 0621 01/26/18 0533  WBC 4.6 4.1 4.7  --  3.8* 3.6* 3.6*  NEUTROABS 3.6  --   --   --  2.7 2.7  --   HGB 8.1* 7.7* 6.8* 8.5* 7.7* 8.3* 8.2*  HCT 24.0* 24.0* 20.6* 24.4* 24.0* 26.1* 25.3*  MCV 102.6* 104.3* 105.6*  --  101.3* 102.4* 102.0*  PLT 113* 104* 88*  --  85* 94* 93*   Cardiac Enzymes: No results for input(s): CKTOTAL, CKMB, CKMBINDEX, TROPONINI in the last 168 hours. BNP: BNP (last 3 results) No results for input(s): BNP in the  last 8760 hours.  ProBNP (last 3 results) No results for input(s): PROBNP in the last 8760 hours.  CBG: Recent Labs  Lab 01/23/18 0817 01/23/18 1138  GLUCAP [id number]*       Signed:  Irine Seal MD.  [facility] 01/26/2018, 6:14 PM

## 2018-01-27 LAB — CULTURE, BLOOD (ROUTINE X 2)
Culture: NO GROWTH
Culture: NO GROWTH

## 2018-01-27 MED FILL — ?FOLIC ACID 1 MG TABLET: 1 | 30 days supply | Qty: 30 | Fill #0

## 2018-01-27 MED FILL — oxyCODONE HCL 5 MG TABS: 5 | 4 days supply | Qty: 15 | Fill #0

## 2018-01-27 MED FILL — CREON DR 36,000 UNITS CAP: 36000 | 30 days supply | Qty: 90 | Fill #0

## 2018-01-27 MED FILL — CLOTRIMAZOLE 10MG TROCHE rx: 10 | 5 days supply | Qty: 25 | Fill #0

## 2018-01-28 LAB — MISC LABCORP TEST (SEND OUT): LABCORP TEST CODE: 502251

## 2018-01-28 LAB — MULTIPLE MYELOMA PANEL, SERUM
ALBUMIN/GLOB SERPL: 0.8 (ref 0.7–1.7)
Albumin SerPl Elph-Mcnc: 3.2 g/dL (ref 2.9–4.4)
Alpha 1: 0.5 g/dL — ABNORMAL HIGH (ref 0.0–0.4)
Alpha2 Glob SerPl Elph-Mcnc: 0.6 g/dL (ref 0.4–1.0)
B-Globulin SerPl Elph-Mcnc: 1 g/dL (ref 0.7–1.3)
GAMMA GLOB SERPL ELPH-MCNC: 2.3 g/dL — AB (ref 0.4–1.8)
GLOBULIN, TOTAL: 4.3 g/dL — AB (ref 2.2–3.9)
IGA: 169 mg/dL (ref 87–352)
IgG (Immunoglobin G), Serum: 2556 mg/dL — ABNORMAL HIGH (ref 700–1600)
IgM (Immunoglobulin M), Srm: 69 mg/dL (ref 26–217)
Total Protein ELP: 7.5 g/dL (ref 6.0–8.5)

## 2018-01-29 ENCOUNTER — Telehealth: Payer: Self-pay

## 2018-01-29 LAB — CRYOGLOBULIN

## 2018-01-29 NOTE — Telephone Encounter (Signed)
Contacted pt to go over Dr. Valentina Gu regarding her lab results pt states she will keep her appointment and she appreciates everything Dr. Wynetta Emery is doing for her

## 2018-02-03 ENCOUNTER — Inpatient Hospital Stay: Payer: Medicaid Other

## 2018-02-03 ENCOUNTER — Encounter: Payer: Self-pay | Admitting: Hematology & Oncology

## 2018-02-03 ENCOUNTER — Other Ambulatory Visit: Payer: Self-pay

## 2018-02-03 ENCOUNTER — Telehealth: Payer: Self-pay | Admitting: *Deleted

## 2018-02-03 ENCOUNTER — Inpatient Hospital Stay: Payer: Medicaid Other | Attending: Hematology & Oncology | Admitting: Hematology & Oncology

## 2018-02-03 VITALS — BP 125/55 | HR 82 | Temp 98.2°F | Resp 20 | Wt 129.0 lb

## 2018-02-03 DIAGNOSIS — Z79899 Other long term (current) drug therapy: Secondary | ICD-10-CM | POA: Diagnosis not present

## 2018-02-03 DIAGNOSIS — K7581 Nonalcoholic steatohepatitis (NASH): Secondary | ICD-10-CM

## 2018-02-03 DIAGNOSIS — R162 Hepatomegaly with splenomegaly, not elsewhere classified: Secondary | ICD-10-CM

## 2018-02-03 DIAGNOSIS — K7460 Unspecified cirrhosis of liver: Secondary | ICD-10-CM | POA: Diagnosis not present

## 2018-02-03 DIAGNOSIS — D61818 Other pancytopenia: Secondary | ICD-10-CM | POA: Insufficient documentation

## 2018-02-03 DIAGNOSIS — R161 Splenomegaly, not elsewhere classified: Secondary | ICD-10-CM | POA: Diagnosis not present

## 2018-02-03 DIAGNOSIS — D638 Anemia in other chronic diseases classified elsewhere: Secondary | ICD-10-CM

## 2018-02-03 LAB — CBC WITH DIFFERENTIAL (CANCER CENTER ONLY)
Basophils Absolute: 0 10*3/uL (ref 0.0–0.1)
Basophils Relative: 0 %
Eosinophils Absolute: 0.2 10*3/uL (ref 0.0–0.5)
Eosinophils Relative: 3 %
HEMATOCRIT: 29.8 % — AB (ref 34.8–46.6)
HEMOGLOBIN: 9.9 g/dL — AB (ref 11.6–15.9)
LYMPHS ABS: 0.7 10*3/uL — AB (ref 0.9–3.3)
LYMPHS PCT: 13 %
MCH: 35.6 pg — AB (ref 26.0–34.0)
MCHC: 33.2 g/dL (ref 32.0–36.0)
MCV: 107.2 fL — AB (ref 81.0–101.0)
MONOS PCT: 5 %
Monocytes Absolute: 0.3 10*3/uL (ref 0.1–0.9)
NEUTROS ABS: 4 10*3/uL (ref 1.5–6.5)
NEUTROS PCT: 79 %
Platelet Count: 115 10*3/uL — ABNORMAL LOW (ref 145–400)
RBC: 2.78 MIL/uL — AB (ref 3.70–5.32)
RDW: 20.2 % — ABNORMAL HIGH (ref 11.1–15.7)
WBC: 5.1 10*3/uL (ref 3.9–10.0)

## 2018-02-03 LAB — IRON AND TIBC
Iron: 196 ug/dL — ABNORMAL HIGH (ref 41–142)
SATURATION RATIOS: 102 % — AB (ref 21–57)
TIBC: 193 ug/dL — ABNORMAL LOW (ref 236–444)
UIBC: UNDETERMINED ug/dL

## 2018-02-03 LAB — CMP (CANCER CENTER ONLY)
ALBUMIN: 3.4 g/dL — AB (ref 3.5–5.0)
ALK PHOS: 145 U/L — AB (ref 26–84)
ALT: 24 U/L (ref 10–47)
AST: 63 U/L — ABNORMAL HIGH (ref 11–38)
Anion gap: 12 (ref 5–15)
BILIRUBIN TOTAL: 4 mg/dL — AB (ref 0.2–1.6)
BUN: 3 mg/dL — ABNORMAL LOW (ref 7–22)
CHLORIDE: 101 mmol/L (ref 98–108)
CO2: 26 mmol/L (ref 18–33)
Calcium: 9.2 mg/dL (ref 8.0–10.3)
Creatinine: 0.7 mg/dL (ref 0.60–1.20)
Glucose, Bld: 98 mg/dL (ref 73–118)
POTASSIUM: 3.3 mmol/L (ref 3.3–4.7)
Sodium: 139 mmol/L (ref 128–145)
Total Protein: 8.3 g/dL — ABNORMAL HIGH (ref 6.4–8.1)

## 2018-02-03 LAB — TSH: TSH: 0.925 u[IU]/mL (ref 0.308–3.960)

## 2018-02-03 LAB — RETICULOCYTES
RBC.: 2.85 MIL/uL — AB (ref 3.70–5.45)
RETIC COUNT ABSOLUTE: 188.1 10*3/uL — AB (ref 33.7–90.7)
RETIC CT PCT: 6.6 % — AB (ref 0.7–2.1)

## 2018-02-03 LAB — LACTATE DEHYDROGENASE: LDH: 240 U/L (ref 125–245)

## 2018-02-03 LAB — FERRITIN: Ferritin: 1084 ng/mL — ABNORMAL HIGH (ref 9–269)

## 2018-02-03 LAB — TECHNOLOGIST SMEAR REVIEW

## 2018-02-03 NOTE — Telephone Encounter (Signed)
Critical Value Total Bilirubin 4.0 Dr Marin Olp notified. No orders at this time

## 2018-02-03 NOTE — Progress Notes (Signed)
Hematology and Oncology Follow Up Visit  Jamie Allen [id number] Apr 04, 1971 47 y.o. 02/03/2018   Principle Diagnosis:   Anemia - multi-factorial  Cirrhosis w/ splenomegaly  Current Therapy:    Aranesp 300 mcg sq q 3 wks for Hgb < 11  IV Iron as needed  Folic acid 1 mg po q day     Interim History:  Jamie Allen is in for her first office visit.  I seen her at the hospital a couple times.  She is very nice.  She comes in with her husband.  It is very difficult to figure out what actually is going on with her.  When she comes into the hospital, she is quite anemic.  Actually, she has some pan cytopenia.  We have done a bone marrow test on her.  The bone marrow test was done back in March.  The pathology report ([id number]) showed a hypercellular marrow with trilineage hematopoiesis.  There is no evidence of any hematologic malignancy.  Of course, since the bone marrow was done in the hospital, the cytogenetics are not sent to me.  I will have to call back to his hospital to get this.  She has had numerous lab tests done.  She may have some element of hemolysis.  At some point, and ER doctor thought that she had TTP.  I am unsure how this was diagnosed but she does not have TTP.  She was told that she has myeloma.  Again, I have absolutely no clue why she was told this.  She had an SPEP done.  There is no monoclonal spike noted.  The bone marrow does not show myeloma.  She has a polyclonal increase in kappa and light chains.  We have checked her for Wilson's disease.  This also has come up negative.  She has had issues with C. difficile.  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  She has been tested for PNH.  This also has been negative.  As always, she is nice.  She does have a strong faith.  I did go ahead and give her a prayer blanket which she very much appreciated.  She does have splenomegaly.  I believe this is from her cirrhosis.  It is possible the splenomegaly is  acting as a "sponge" and causing her to have some pancytopenia issues.  I am not sure what other blood test we can run on her right now.  She had an erythropoietin level in the hospital of only 42.  As such, I thought that Aranesp would help.  She is gotten a couple doses in the hospital.  She has had some iron deficiency issues.  She is gotten IV iron in the hospital.  Today, her ferritin was 1084 with an iron saturation of 102%.  She is not a vegetarian.  She does not drink.  She still smokes a little.  Overall, her performance status is ECOG 2.  Medications:  Current Outpatient Medications:  .  Cholecalciferol (VITAMIN D PO), Take 1 capsule by mouth daily., Disp: , Rfl:  .  colestipol (COLESTID) 1 g tablet, Take 1 tablet (1 g total) by mouth 2 (two) times daily., Disp: 30 tablet, Rfl: 0 .  dicyclomine (BENTYL) 10 MG capsule, Take 1 capsule (10 mg total) by mouth 3 (three) times daily before meals. (Patient taking differently: Take 10 mg by mouth 3 (three) times daily. ), Disp: 90 capsule, Rfl: 3 .  diphenhydrAMINE (BENADRYL) 25 MG tablet, Take 25 mg by  mouth daily., Disp: , Rfl:  .  esomeprazole (NEXIUM) 20 MG capsule, Take 20 mg by mouth daily at 12 noon., Disp: , Rfl:  .  folic acid (FOLVITE) 1 MG tablet, Take 1 tablet (1 mg total) by mouth daily., Disp: 30 tablet, Rfl: 0 .  levothyroxine (SYNTHROID, LEVOTHROID) 75 MCG tablet, Take 1 tablet (75 mcg total) by mouth daily before breakfast., Disp: 30 tablet, Rfl: 2 .  lipase/protease/amylase (CREON) 36000 UNITS CPEP capsule, Take 1 capsule (36,000 Units total) by mouth 3 (three) times daily before meals., Disp: 90 capsule, Rfl: 0 .  loperamide (IMODIUM A-D) 2 MG tablet, Take 1 tablet (2 mg total) by mouth 4 (four) times daily as needed for diarrhea or loose stools., Disp: 30 tablet, Rfl: 0 .  Melatonin 10 MG TABS, Take 10 mg by mouth at bedtime as needed (for sleep)., Disp: , Rfl:  .  Multiple Vitamin (MULTIVITAMIN WITH MINERALS) TABS  tablet, Take 1 tablet by mouth daily., Disp: 30 tablet, Rfl: 0 .  nicotine polacrilex (NICORETTE) 2 MG gum, Take 2 mg by mouth 3 (three) times daily., Disp: , Rfl:  .  ondansetron (ZOFRAN) 4 MG tablet, Take 1 tablet (4 mg total) by mouth every 6 (six) hours as needed for nausea., Disp: 20 tablet, Rfl: 0 .  pregabalin (LYRICA) 50 MG capsule, Take 1 capsule (50 mg total) by mouth 3 (three) times daily., Disp: 540 capsule, Rfl: 1 .  thiamine 100 MG tablet, Take 1 tablet (100 mg total) by mouth daily., Disp: 30 tablet, Rfl: 0 .  bismuth subsalicylate (PEPTO BISMOL) 262 MG/15ML suspension, Take 30 mLs by mouth every 6 (six) hours as needed for diarrhea or loose stools., Disp: , Rfl:  .  hydrOXYzine (ATARAX/VISTARIL) 10 MG tablet, Take 1 tablet (10 mg total) by mouth 2 (two) times daily. (Patient not taking: Reported on 02/03/2018), Disp: 60 tablet, Rfl: 3 .  Multiple Vitamin (MULTIVITAMIN WITH MINERALS) TABS tablet, Take 1 tablet by mouth daily., Disp: , Rfl:  .  nicotine (NICODERM CQ - DOSED IN MG/24 HOURS) 21 mg/24hr patch, Place 1 patch (21 mg total) onto the skin daily. (Patient not taking: Reported on 02/03/2018), Disp: 28 patch, Rfl: 0 .  oxyCODONE (OXY IR/ROXICODONE) 5 MG immediate release tablet, Take 1 tablet (5 mg total) by mouth every 6 (six) hours as needed for moderate pain. (Patient not taking: Reported on 02/03/2018), Disp: 15 tablet, Rfl: 0 .  simethicone (MYLICON) 80 MG chewable tablet, Chew 1 tablet (80 mg total) by mouth 4 (four) times daily as needed for flatulence. (Patient not taking: Reported on 02/03/2018), Disp: 30 tablet, Rfl: 0  Allergies:  Allergies  Allergen Reactions  . Iohexol Hives, Itching and Swelling  . Lactose Intolerance (Gi) Diarrhea and Nausea Only  . Other Other (See Comments)    Spicy foods "tear up my stomach"  . Vancomycin Itching    Nose itching  . Morphine And Related Itching and Other (See Comments)    Can tolerate with Benadryl    Past Medical History,  Surgical history, Social history, and Family History were reviewed and updated.  Review of Systems: Review of Systems  Constitutional: Positive for fatigue.  HENT:   Positive for sore throat and trouble swallowing.   Eyes: Negative.   Respiratory: Positive for shortness of breath.   Cardiovascular: Positive for palpitations.  Gastrointestinal: Positive for abdominal distention, abdominal pain, diarrhea and nausea.  Endocrine: Negative.   Genitourinary: Positive for frequency.   Musculoskeletal: Positive for arthralgias,  gait problem and myalgias.  Skin: Negative.   Neurological: Positive for dizziness and gait problem.  Hematological: Negative.   Psychiatric/Behavioral: Negative.     Physical Exam:  weight is 129 lb (58.5 kg). Her oral temperature is 98.2 F (36.8 C). Her blood pressure is 125/55 (abnormal) and her pulse is 82. Her respiration is 20 and oxygen saturation is 100%.   Wt Readings from Last 3 Encounters:  02/03/18 129 lb (58.5 kg)  01/26/18 132 lb 4.8 oz (60 kg)  12/24/17 140 lb 14 oz (63.9 kg)    Physical Exam  Constitutional: She is oriented to person, place, and time.  HENT:  Head: Normocephalic and atraumatic.  Mouth/Throat: Oropharynx is clear and moist.  Eyes: Pupils are equal, round, and reactive to light. EOM are normal.  Neck: Normal range of motion.  Cardiovascular: Normal rate, regular rhythm and normal heart sounds.  Pulmonary/Chest: Effort normal and breath sounds normal.  Abdominal: Soft. Bowel sounds are normal.  Musculoskeletal: Normal range of motion. She exhibits no edema, tenderness or deformity.  Lymphadenopathy:    She has no cervical adenopathy.  Neurological: She is alert and oriented to person, place, and time.  Skin: Skin is warm and dry. No rash noted. No erythema.  Psychiatric: She has a normal mood and affect. Her behavior is normal. Judgment and thought content normal.  Vitals reviewed.    Lab Results  Component Value Date    WBC 5.1 02/03/2018   HGB 8.2 (L) 01/26/2018   HCT 29.8 (L) 02/03/2018   MCV 107.2 (H) 02/03/2018   PLT 115 (L) 02/03/2018     Chemistry      Component Value Date/Time   NA 139 02/03/2018 1115   NA 136 08/13/2017 1521   K 3.3 02/03/2018 1115   CL 101 02/03/2018 1115   CO2 26 02/03/2018 1115   BUN 3 (L) 02/03/2018 1115   BUN 5 (L) 08/13/2017 1521   CREATININE 0.70 02/03/2018 1115   CREATININE 1.01 05/19/2016 1639      Component Value Date/Time   CALCIUM 9.2 02/03/2018 1115   ALKPHOS 145 (H) 02/03/2018 1115   AST 63 (H) 02/03/2018 1115   ALT 24 02/03/2018 1115   BILITOT 4.0 (HH) 02/03/2018 1115       Impression and Plan: Ms. Ridge is a 47 year old white female.  She has mild pancytopenia.  Actually, her blood counts are better than they usually are when I have seen her in the hospital.  I am still not sure exactly what is going on.  It is possible that her splenomegaly could be the problem.  However, given that she has cirrhosis, I would not favor taken her spleen out unless her blood counts really drop and do not improve.  I will not give her any Aranesp today.  I would like to see her back in about 3 or 4 weeks.  I will check her for cold agglutinins.  I will also check her for cryoglobulins.  I spent about 40 minutes with she and her husband.  It is always nice to see her.  Over half the time was spent face-to-face.   Volanda Napoleon, MD 4/17/20195:35 PM

## 2018-02-04 ENCOUNTER — Ambulatory Visit: Payer: Medicaid Other | Attending: Internal Medicine | Admitting: Internal Medicine

## 2018-02-04 ENCOUNTER — Encounter: Payer: Self-pay | Admitting: Internal Medicine

## 2018-02-04 ENCOUNTER — Ambulatory Visit: Payer: Medicaid Other | Admitting: Licensed Clinical Social Worker

## 2018-02-04 VITALS — BP 123/73 | HR 87 | Temp 98.4°F | Resp 16 | Wt 130.0 lb

## 2018-02-04 DIAGNOSIS — Z881 Allergy status to other antibiotic agents status: Secondary | ICD-10-CM | POA: Insufficient documentation

## 2018-02-04 DIAGNOSIS — R300 Dysuria: Secondary | ICD-10-CM | POA: Diagnosis not present

## 2018-02-04 DIAGNOSIS — E039 Hypothyroidism, unspecified: Secondary | ICD-10-CM | POA: Diagnosis not present

## 2018-02-04 DIAGNOSIS — F419 Anxiety disorder, unspecified: Secondary | ICD-10-CM | POA: Diagnosis not present

## 2018-02-04 DIAGNOSIS — F17210 Nicotine dependence, cigarettes, uncomplicated: Secondary | ICD-10-CM | POA: Diagnosis not present

## 2018-02-04 DIAGNOSIS — Z888 Allergy status to other drugs, medicaments and biological substances status: Secondary | ICD-10-CM | POA: Insufficient documentation

## 2018-02-04 DIAGNOSIS — G629 Polyneuropathy, unspecified: Secondary | ICD-10-CM | POA: Insufficient documentation

## 2018-02-04 DIAGNOSIS — D61818 Other pancytopenia: Secondary | ICD-10-CM | POA: Diagnosis not present

## 2018-02-04 DIAGNOSIS — Z79899 Other long term (current) drug therapy: Secondary | ICD-10-CM | POA: Diagnosis not present

## 2018-02-04 DIAGNOSIS — G8929 Other chronic pain: Secondary | ICD-10-CM | POA: Insufficient documentation

## 2018-02-04 DIAGNOSIS — D759 Disease of blood and blood-forming organs, unspecified: Secondary | ICD-10-CM

## 2018-02-04 DIAGNOSIS — R109 Unspecified abdominal pain: Secondary | ICD-10-CM

## 2018-02-04 DIAGNOSIS — F329 Major depressive disorder, single episode, unspecified: Secondary | ICD-10-CM | POA: Diagnosis not present

## 2018-02-04 DIAGNOSIS — Z885 Allergy status to narcotic agent status: Secondary | ICD-10-CM | POA: Insufficient documentation

## 2018-02-04 DIAGNOSIS — F331 Major depressive disorder, recurrent, moderate: Secondary | ICD-10-CM

## 2018-02-04 DIAGNOSIS — Z8619 Personal history of other infectious and parasitic diseases: Secondary | ICD-10-CM | POA: Diagnosis not present

## 2018-02-04 DIAGNOSIS — K7030 Alcoholic cirrhosis of liver without ascites: Secondary | ICD-10-CM

## 2018-02-04 LAB — HEMOGLOBINOPATHY EVALUATION
HGB S QUANTITAION: 0 %
HGB VARIANT: 0 %
Hgb A2 Quant: 2.2 % (ref 1.8–3.2)
Hgb A: 97.2 % (ref 96.4–98.8)
Hgb C: 0 %
Hgb F Quant: 0.6 % (ref 0.0–2.0)

## 2018-02-04 MED ORDER — SERTRALINE HCL 50 MG PO TABS: ORAL_TABLET | ORAL | 0 refills | Status: DC

## 2018-02-04 MED ORDER — TRAMADOL HCL 50 MG PO TABS: 50.0000 mg | ORAL_TABLET | Freq: Two times a day (BID) | ORAL | 0 refills | Status: DC | PRN

## 2018-02-04 MED FILL — traMADol HCL 50 MG TABS: 50 | 30 days supply | Qty: 60 | Fill #0

## 2018-02-04 MED FILL — SERTRALINE HCL 50 MG TABLET: 50 | 30 days supply | Qty: 30 | Fill #0

## 2018-02-04 NOTE — BH Specialist Note (Signed)
Integrated Behavioral Health Initial Visit  MRN: [id number] Name: Jamie Allen  Number of [place] Clinician visits:: 1/6 Session Start time: 10:10 AM  Session End time: 10:45 AM Total time: 35 minutes  Type of Service: [place] Interpretor:No. Interpretor Name and Language: N/A   Warm Hand Off Completed.       SUBJECTIVE: Jamie Allen is a 47 y.o. female accompanied by Partner/Significant Other Patient was referred by Dr. Wynetta Emery for depression and anxiety. Patient reports the following symptoms/concerns: overwhelming feelings of sadness and worry, difficulty sleeping, low energy/motivation, decreased concentration, and irritability Duration of problem: Ongoing; Severity of problem: severe  OBJECTIVE: Mood: Anxious and Depressed and Affect: Depressed and Tearful Risk of harm to self or others: No plan to harm self or others  LIFE CONTEXT: Family and Social: Pt receives strong support from partner of ten years. She has a sister who is scheduled to move in next month to assist in caring for her School/Work: Pt is unemployed. She has pending Medicaid and a scheduled disability hearing for August 2019. Pt has a lawyer to assist with pending applications Self-Care: Pt has hx of substance use; however, denies current use.  Life Changes: Pt has ongoing medical conditions that has resulted in recent multiple hospitalizations. She is experiencing financial strain from hospital bills  GOALS ADDRESSED: Patient will: 1. Reduce symptoms of: agitation, anxiety and depression 2. Increase knowledge and/or ability of: coping skills and healthy habits  3. Demonstrate ability to: Increase healthy adjustment to current life circumstances and Increase adequate support systems for patient/family  INTERVENTIONS: Interventions utilized: Solution-Focused Strategies, Mindfulness or Psychologist, educational, Supportive Counseling,  Psychoeducation and/or Health Education and Link to [employer]  Standardized Assessments completed: GAD-7 and PHQ 2&9  ASSESSMENT: Patient currently experiencing depression and anxiety triggered by ongoing medical concerns and financial stress from hospital bills. She reports overwhelming feelings of sadness and worry, difficulty sleeping, low energy/motivation, decreased concentration, and irritability. Pt has strong support system from partner and family. No report of SI/HI/AVH.   Patient may benefit from psychoeducation and psychotherapy. Leshara educated pt on correlation between one's physical and mental health, in addition, to how stress can negatively impact both. Therapeutic interventions were discussed to decrease symptoms and promote positive feelings. Pt was provided SCAT application and resources on [facility] to assist with free exercise classes/volunteer opportunities. She has agreed to medication management through PCP. Pt currently has an [employer] card and will schedule an appointment with financial counseling to re-certify. Pt is knowledgeable of community resources for crisis intervention and psychotherapy.   PLAN: 1. Follow up with behavioral health clinician on : Pt was encouraged to contact [facility] if symptoms worsen or fail to improve to schedule behavioral appointments at [facility]. 2. Behavioral recommendations: [facility] recommends that pt apply healthy coping skills discussed, comply with medication management, utilize provided resources, and schedule appointment with financial counseling. Pt is encouraged to schedule follow up appointment with [facility] 3. Referral(s): [facility] ([facility] Clinic), Community Resources:  Transportation and [employer] 4. "From scale of 1-10, how likely are you to follow plan?": 8/10  Rebekah Chesterfield, LCSW 02/04/18 4:14 PM

## 2018-02-04 NOTE — Progress Notes (Signed)
Patient ID: Jamie Allen, female    DOB: Apr 06, 1971  MRN: [id number]  CC: Follow-up (3 month )   Subjective: Jamie Allen is a 47 y.o. female who presents for hosp f/u.  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  CT abdomen neg, C.diff toxin neg.  Bilirubin elevated. There was question of TTP/HUS due to schistocytes on the smear.  Seen by hematology; assessment was pancytopenia, abnormal LFT and schistocytes likely secondary to cirrhosis and infection.  Had bone marrow bx last mth that revealed hypercellular marrow.  No evidence of hematologic malignancy.  Pt transfused 1 unit PRBC and started on Procrit. Also started on Creon for concerns of pancreatic insuff. Out-pt f/u with hematology arranged.  She has seen heme/onc Dr. Marin Olp in f/u yesterday.  Pt told to f/u with me and speak with me about pain management.  She has recurrent pain in RUQ which she thinks is due to hepatomegaly.  Dischg from hosp with limited supply of Oxycodone. She does have hepatosplenomegaly on CT and by exam. "When I'm in pain I don't eat."  Eats about 1 meal a day. Problems with certain food  texture and smells,  Diarrhea has stopped.  She has about one formed BM a day.  Last used Imodium 4 days ago.   Urine is orange with strong smell.  No dysuria.  Would like to get on med for dep/anx.  Denies any SI. Says hurtful things to  family when she does not feel good.  Was on Zoloft in past and did well with that. -admits that she still drinks beer occasionally.  Last had a beer about 3 wks ago. Denies any street drug use. -sister will be coming to stay with her for a while to help her out  Patient Active Problem List   Diagnosis Date  Noted  . Other cirrhosis of liver ([place])   . Abdominal distension   . Hemolytic anemia associated with infection ([place]) 01/22/2018  . Immune deficiency disorder ([place])   . History of blood transfusion   . Fever of unknown origin 01/06/2018  . Acute kidney injury ([place]) 01/06/2018  . Acute on chronic anemia 01/06/2018  . Internal bleeding hemorrhoids   . Macrocytic anemia 12/24/2017  . Thrombocytopenia ([place]) 12/24/2017  . Alcohol abuse 12/24/2017  . Septic shock ([place]) 12/23/2017  . Tobacco abuse 12/23/2017  . Peripheral neuropathy   . Nonalcoholic steatohepatitis ([place])   . Hypertension   . Drug-seeking behavior   . Ankle fracture, left   . Pancytopenia ([place])   . Alcoholic cirrhosis of liver ([place]) 09/22/2017  . 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 Portal hypertensive gastropathy ([place]) 05/28/2017  . Anemia of chronic disease 05/28/2017  . Hepatosplenomegaly 01/03/2017  . Obesity (BMI 30-39.9) 01/03/2017  . GERD (gastroesophageal reflux disease)   . Anxiety   . Chronic cholecystitis with calculus s/p lap cholecystectomy 12/25/2016 12/24/2016  . Postmenopausal syndrome 11/24/2016  . Hypothyroidism 12/06/2015  . Foot fracture 10/06/2015  . Bimalleolar fracture of right ankle 10/06/2015  . Foot fracture, left 10/06/2015  . Abnormality of gait 10/02/2015  . Anxiety and depression 04/19/2014  . C. difficile diarrhea 02/02/2014  . Post-traumatic stress 01/03/2014  . Hemorrhage 01/03/2014  . Colitis 01/03/2014  .  Antral gastritis 10/20/2013  . Diarrhea 07/21/2013     Current Outpatient Medications on File Prior to Visit  Medication Sig Dispense Refill  . bismuth subsalicylate (PEPTO BISMOL) 262 MG/15ML suspension Take 30 mLs by mouth every 6 (six) hours as needed for diarrhea or loose stools.    . Cholecalciferol (VITAMIN D PO) Take 1 capsule by mouth daily.    . colestipol (COLESTID) 1 g tablet Take 1 tablet (1 g total) by mouth 2 (two) times daily. 30 tablet 0  .  dicyclomine (BENTYL) 10 MG capsule Take 1 capsule (10 mg total) by mouth 3 (three) times daily before meals. (Patient taking differently: Take 10 mg by mouth 3 (three) times daily. ) 90 capsule 3  . diphenhydrAMINE (BENADRYL) 25 MG tablet Take 25 mg by mouth daily.    Marland Kitchen esomeprazole (NEXIUM) 20 MG capsule Take 20 mg by mouth daily at 12 noon.    . folic acid (FOLVITE) 1 MG tablet Take 1 tablet (1 mg total) by mouth daily. 30 tablet 0  . hydrOXYzine (ATARAX/VISTARIL) 10 MG tablet Take 1 tablet (10 mg total) by mouth 2 (two) times daily. (Patient not taking: Reported on 02/03/2018) 60 tablet 3  . levothyroxine (SYNTHROID, LEVOTHROID) 75 MCG tablet Take 1 tablet (75 mcg total) by mouth daily before breakfast. 30 tablet 2  . lipase/protease/amylase (CREON) 36000 UNITS CPEP capsule Take 1 capsule (36,000 Units total) by mouth 3 (three) times daily before meals. 90 capsule 0  . loperamide (IMODIUM A-D) 2 MG tablet Take 1 tablet (2 mg total) by mouth 4 (four) times daily as needed for diarrhea or loose stools. 30 tablet 0  . Melatonin 10 MG TABS Take 10 mg by mouth at bedtime as needed (for sleep).    . Multiple Vitamin (MULTIVITAMIN WITH MINERALS) TABS tablet Take 1 tablet by mouth daily. 30 tablet 0  . Multiple Vitamin (MULTIVITAMIN WITH MINERALS) TABS tablet Take 1 tablet by mouth daily.    . nicotine (NICODERM CQ - DOSED IN MG/24 HOURS) 21 mg/24hr patch Place 1 patch (21 mg total) onto the skin daily. (Patient not taking: Reported on 02/03/2018) 28 patch 0  . nicotine polacrilex (NICORETTE) 2 MG gum Take 2 mg by mouth 3 (three) times daily.    . ondansetron (ZOFRAN) 4 MG tablet Take 1 tablet (4 mg total) by mouth every 6 (six) hours as needed for nausea. 20 tablet 0  . oxyCODONE (OXY IR/ROXICODONE) 5 MG immediate release tablet Take 1 tablet (5 mg total) by mouth every 6 (six) hours as needed for moderate pain. (Patient not taking: Reported on 02/03/2018) 15 tablet 0  . pregabalin (LYRICA) 50 MG capsule  Take 1 capsule (50 mg total) by mouth 3 (three) times daily. 540 capsule 1  . simethicone (MYLICON) 80 MG chewable tablet Chew 1 tablet (80 mg total) by mouth 4 (four) times daily as needed for flatulence. (Patient not taking: Reported on 02/03/2018) 30 tablet 0  . thiamine 100 MG tablet Take 1 tablet (100 mg total) by mouth daily. 30 tablet 0   No current facility-administered medications on file prior to visit.     Allergies  Allergen Reactions  . Iohexol Hives, Itching and Swelling  . Lactose Intolerance (Gi) Diarrhea and Nausea Only  . Other Other (See Comments)    Spicy foods "tear up my stomach"  . Vancomycin Itching    Nose itching  . Morphine And Related Itching and Other (See Comments)    Can tolerate with Benadryl  Social History   Socioeconomic History  . Marital status: Single    Spouse name: Not on file  . Number of children: 0  . Years of education: 11th  . Highest education level: Not on file  Occupational History  . Occupation: Unemployed  Social Needs  . Financial resource strain: Not on file  . Food insecurity:    Worry: Not on file    Inability: Not on file  . Transportation needs:    Medical: Not on file    Non-medical: Not on file  Tobacco Use  . Smoking status: Current Some Day Smoker    Packs/day: 0.70    Years: 20.00    Pack years: 14.00    Types: Cigarettes  . Smokeless tobacco: Never Used  Substance and Sexual Activity  . Alcohol use: No    Alcohol/week: 0.0 oz    Comment: no etoh now- used to be 21 glasses wine a week, did 1 beer a day but not doing that now either   . Drug use: No    Types: Marijuana    Comment: Per patient - has not used marijuana since her 65s  . Sexual activity: Not Currently    Birth control/protection: None    Comment: 01-03-14 States not sexually active with current boyfriend-due to pain and abdominal issues.  Lifestyle  . Physical activity:    Days per week: Not on file    Minutes per session: Not on file  .  Stress: Not on file  Relationships  . Social connections:    Talks on phone: Not on file    Gets together: Not on file    Attends religious service: Not on file    Active member of club or organization: Not on file    Attends meetings of clubs or organizations: Not on file    Relationship status: Not on file  . Intimate partner violence:    Fear of current or ex partner: Not on file    Emotionally abused: Not on file    Physically abused: Not on file    Forced sexual activity: Not on file  Other Topics Concern  . Not on file  Social History Narrative   Lives at home with her boyfriend, [name].   Right-handed.   Caffeine: 3 cups daily.   2 children.   Currently trying to get disability.     Previously was a Educational psychologist.        Family History  Problem Relation Age of Onset  . Hypertension Mother   . Hyperlipidemia Mother   . Suicidality Father   . Stomach cancer Father   . Hypothyroidism Sister   . Breast cancer Maternal Grandmother   . Heart attack Maternal Grandfather   . Aneurysm Paternal Grandfather        brain   . Colon cancer Neg Hx   . Colon polyps Neg Hx   . Esophageal cancer Neg Hx   . Rectal cancer Neg Hx     Past Surgical History:  Procedure Laterality Date  . CHOLECYSTECTOMY N/A 12/25/2016   Procedure: LAPAROSCOPIC CHOLECYSTECTOMY WITH INTRAOPERATIVE CHOLANGIOGRAM;  Surgeon: Autumn Messing III, MD;  Location: [facility];  Service: General;  Laterality: N/A;  . COLONOSCOPY    . COLONOSCOPY N/A 05/01/2017   Procedure: COLONOSCOPY;  Surgeon: Jerene Bears, MD;  Location: [facility] END[facility];  Service: Endoscopy;  Laterality: N/A;  . COLONOSCOPY WITH PROPOFOL N/A 01/18/2014   Multiple small polyps (8) removed as above; Small internal hemorrhoids; No  evidence of colitis  . COLONOSCOPY WITH PROPOFOL N/A 11/11/2017   Procedure: COLONOSCOPY WITH PROPOFOL;  Surgeon: Yetta Flock, MD;  Location: [facility] ENDOSCOPY;  Service: Gastroenterology;  Laterality: N/A;  .  ESOPHAGOGASTRODUODENOSCOPY N/A 02/06/2014   Antral Gastritis. Biopsies obtained not clear if this is related to her nausea and vomiting  . 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 FLEXIBLE SIGMOIDOSCOPY N/A 12/17/2013   Procedure: FLEXIBLE SIGMOIDOSCOPY;  Surgeon: Missy Sabins, MD;  Location: [place];  Service: Endoscopy;  Laterality: N/A;  . FRACTURE SURGERY    . ORIF ANKLE FRACTURE Right 10/07/2015   Procedure: OPEN REDUCTION INTERNAL FIXATION (ORIF)  BIMALLEOLAR ANKLE FRACTURE;  Surgeon: Marybelle Killings, MD;  Location: [place];  Service: Orthopedics;  Laterality: Right;  . POLYPECTOMY    . TONSILLECTOMY    . UPPER GASTROINTESTINAL ENDOSCOPY      ROS: Review of Systems Neg except as above PHYSICAL EXAM: BP 123/73   Pulse 87   Temp 98.4 F (36.9 C) (Oral)   Resp 16   Wt 130 lb (59 kg)   LMP 06/01/2014   SpO2 95%   BMI 24.56 kg/m   Wt Readings from Last 3 Encounters:  02/04/18 130 lb (59 kg)  02/03/18 129 lb (58.5 kg)  01/26/18 132 lb 4.8 oz (60 kg)    Physical Exam  General appearance - alert, well appearing, and in no distress Mental status - alert, oriented to person, place, and time, normal mood, behavior, speech, dress, motor activity, and thought processes Mouth - mucous membranes moist, pharynx normal without lesions Chest - clear to auscultation, no wheezes, rales or rhonchi, symmetric air entry Heart - normal rate, regular rhythm, normal S1, S2, no murmurs, rubs, clicks or gallops Abdomen - bowel sounds normal, +hepatomegaly, +splenomegaly Ext: no LE edema  Lab Results  Component Value Date   WBC 5.1 02/03/2018   HGB 8.2 (L) 01/26/2018   HCT 29.8 (L) 02/03/2018   MCV 107.2 (H) 02/03/2018   PLT 115 (L) 02/03/2018     Chemistry      Component Value Date/Time   NA 139 02/03/2018 1115   NA 136 08/13/2017 1521   K 3.3 02/03/2018  1115   CL 101 02/03/2018 1115   CO2 26 02/03/2018 1115   BUN 3 (L) 02/03/2018 1115   BUN 5 (L) 08/13/2017 1521   CREATININE 0.70 02/03/2018 1115   CREATININE 1.01 05/19/2016 1639      Component Value Date/Time   CALCIUM 9.2 02/03/2018 1115   ALKPHOS 145 (H) 02/03/2018 1115   AST 63 (H) 02/03/2018 1115   ALT 24 02/03/2018 1115   BILITOT 4.0 (HH) 02/03/2018 1115        Depression screen PHQ 2/9 02/04/2018 10/22/2017 08/13/2017  Decreased Interest 3 0 1  Down, Depressed, Hopeless 3 0 2  PHQ - 2 Score 6 0 3  Altered sleeping 2 - 2  Tired, decreased energy 3 - 1  Change in appetite 3 - 1  Feeling bad or failure about yourself  2 - 2  Trouble concentrating 2 - 1  Moving slowly or fidgety/restless 3 - 2  Suicidal thoughts 0 - 0  PHQ-9 Score 21 - 12  Some recent data might be hidden   GAD 7 : Generalized Anxiety Score 02/04/2018 08/13/2017 05/15/2017 04/06/2017  Nervous, Anxious, on Edge 3 1 1 2   Control/stop worrying  3 1 1 3   Worry too much - different things 3 1 2 3   Trouble relaxing 3 1 2 2   Restless 3 2 1  0  Easily annoyed or irritable 3 1 2 3   Afraid - awful might happen 3 1 0 1  Total GAD 7 Score 21 8 9 14      ASSESSMENT AND PLAN: 1. Anxiety and depression Seen by LCSW today Start Zoloft.  Recheck in 6 wks - sertraline (ZOLOFT) 50 MG tablet; 1/2 tAB daily for 2 weeks then 1 tab PO daily  Dispense: 30 tablet; Refill: 0  2. Chronic abdominal pain -likely due to hepatomegaly. Pt has hx of ETOH abuse but has almost quit.  I am agreeable to putting her on Tramadol BID PRN if it will dec pain to allow adequate nutritional intake. Pt agreeable to this also.  She is warned not to take more than prescribed given poor liver function. Controlled Substance Prescribing agreement reviewed and signed by pt.  UDS today - traMADol (ULTRAM) 50 MG tablet; Take 1 tablet (50 mg total) by mouth 2 (two) times daily as needed.  Dispense: 60 tablet; Refill: 0 - [id number]  11+Oxyco+Alc+Crt-Bund  3. Alcoholic cirrhosis of liver without ascites ([place]) -encouraged total abstinence   4. Cytopenia -improving.  Followed by heme/onc  5. History of Clostridium difficile colitis S/p fecal transplant.  No diarrhea at this time  6. Dysuria - Urinalysis   Patient was given the opportunity to ask questions.  Patient verbalized understanding of the plan and was able to repeat key elements of the plan.   No orders of the defined types were placed in this encounter.    Requested Prescriptions    No prescriptions requested or ordered in this encounter    No follow-ups on file.  Karle Plumber, MD, FACP

## 2018-02-04 NOTE — Progress Notes (Signed)
Pt states when she was in the hospital the pcp Dr. Marcello Moores told pt she will need to speak with her pcp to figure out a pain regimen  Pt states when they gave her pain medicine she was able to eat and function  Pt states when she is in pain she is unable to sleep, eat and she takes anger and frustration out on family members and love ones   Pt states she is having neuropathy pain in her legs and she is having right side pain

## 2018-02-04 NOTE — Patient Instructions (Signed)
Star Tramadol as needed for pain. Start Zoloft for depression/anxiety

## 2018-02-05 LAB — URINALYSIS
Bilirubin, UA: NEGATIVE
Glucose, UA: NEGATIVE
Ketones, UA: NEGATIVE
Leukocytes, UA: NEGATIVE
Nitrite, UA: NEGATIVE
PH UA: 7 (ref 5.0–7.5)
PROTEIN UA: NEGATIVE
RBC UA: NEGATIVE
UUROB: 0.2 mg/dL (ref 0.2–1.0)

## 2018-02-08 LAB — DRUG SCREEN 764883 11+OXYCO+ALC+CRT-BUND
AMPHETAMINES, URINE: NEGATIVE ng/mL
BENZODIAZ UR QL: NEGATIVE ng/mL
Barbiturate: NEGATIVE ng/mL
CANNABINOID QUANT UR: NEGATIVE ng/mL
COCAINE (METABOLITE): NEGATIVE ng/mL
Creatinine: 21.6 mg/dL (ref 20.0–300.0)
METHADONE SCREEN, URINE: NEGATIVE ng/mL
Meperidine: NEGATIVE ng/mL
OPIATE SCREEN URINE: NEGATIVE ng/mL
Oxycodone/Oxymorphone, Urine: NEGATIVE ng/mL
PHENCYCLIDINE: NEGATIVE ng/mL
PROPOXYPHENE: NEGATIVE ng/mL
Tramadol: NEGATIVE ng/mL
pH, Urine: 6.9 (ref 4.5–8.9)

## 2018-02-08 LAB — PNH PROFILE (-HIGH SENSITIVITY): Viability:: 96

## 2018-02-08 LAB — PANEL 764016: Ethanol: 0.116 %

## 2018-02-11 ENCOUNTER — Encounter (HOSPITAL_COMMUNITY): Payer: Self-pay | Admitting: Hematology & Oncology

## 2018-02-11 LAB — CHROMOSOME ANALYSIS, BONE MARROW

## 2018-02-15 ENCOUNTER — Telehealth: Payer: Self-pay | Admitting: Hematology & Oncology

## 2018-02-15 NOTE — Telephone Encounter (Signed)
Faxed medical records to: [facility]  F: [phone] P: [phone] [id number]   for   [facility] Dec 28, 1970 CASE: [id number]

## 2018-02-25 ENCOUNTER — Inpatient Hospital Stay: Payer: Medicaid Other | Admitting: Hematology & Oncology

## 2018-02-25 ENCOUNTER — Inpatient Hospital Stay: Payer: Medicaid Other

## 2018-03-11 ENCOUNTER — Telehealth: Payer: Self-pay | Admitting: Hematology & Oncology

## 2018-03-11 NOTE — Telephone Encounter (Signed)
Faxed medical records to: [facility], [address] [place] F: [id number] P: [phone]       COPY SCANNED

## 2018-03-16 ENCOUNTER — Other Ambulatory Visit: Payer: Self-pay | Admitting: Internal Medicine

## 2018-03-16 DIAGNOSIS — G8929 Other chronic pain: Secondary | ICD-10-CM

## 2018-03-16 DIAGNOSIS — R109 Unspecified abdominal pain: Secondary | ICD-10-CM

## 2018-03-16 DIAGNOSIS — F32A Depression, unspecified: Secondary | ICD-10-CM

## 2018-03-16 DIAGNOSIS — F329 Major depressive disorder, single episode, unspecified: Secondary | ICD-10-CM

## 2018-03-16 DIAGNOSIS — F419 Anxiety disorder, unspecified: Principal | ICD-10-CM

## 2018-03-16 MED FILL — PROMETHAZINE 25 MG TABLET: 25 | 15 days supply | Qty: 45 | Fill #2

## 2018-03-17 ENCOUNTER — Other Ambulatory Visit: Payer: Self-pay | Admitting: *Deleted

## 2018-03-17 ENCOUNTER — Inpatient Hospital Stay: Payer: Medicaid Other | Attending: Hematology & Oncology

## 2018-03-17 ENCOUNTER — Inpatient Hospital Stay (HOSPITAL_BASED_OUTPATIENT_CLINIC_OR_DEPARTMENT_OTHER): Payer: Medicaid Other | Admitting: Hematology & Oncology

## 2018-03-17 VITALS — BP 104/34 | HR 89 | Temp 98.6°F | Resp 20 | Wt 127.8 lb

## 2018-03-17 DIAGNOSIS — D638 Anemia in other chronic diseases classified elsewhere: Secondary | ICD-10-CM

## 2018-03-17 DIAGNOSIS — R740 Nonspecific elevation of levels of transaminase and lactic acid dehydrogenase [LDH]: Secondary | ICD-10-CM | POA: Insufficient documentation

## 2018-03-17 DIAGNOSIS — D61818 Other pancytopenia: Secondary | ICD-10-CM

## 2018-03-17 DIAGNOSIS — R161 Splenomegaly, not elsewhere classified: Secondary | ICD-10-CM | POA: Insufficient documentation

## 2018-03-17 DIAGNOSIS — R162 Hepatomegaly with splenomegaly, not elsewhere classified: Secondary | ICD-10-CM

## 2018-03-17 DIAGNOSIS — K7460 Unspecified cirrhosis of liver: Secondary | ICD-10-CM | POA: Insufficient documentation

## 2018-03-17 DIAGNOSIS — K7581 Nonalcoholic steatohepatitis (NASH): Secondary | ICD-10-CM

## 2018-03-17 LAB — IRON AND TIBC
Iron: 144 ug/dL — ABNORMAL HIGH (ref 41–142)
SATURATION RATIOS: 94 % — AB (ref 21–57)
TIBC: 153 ug/dL — ABNORMAL LOW (ref 236–444)
UIBC: 9 ug/dL

## 2018-03-17 LAB — CBC WITH DIFFERENTIAL (CANCER CENTER ONLY)
Basophils Absolute: 0 10*3/uL (ref 0.0–0.1)
Basophils Relative: 0 %
EOS PCT: 2 %
Eosinophils Absolute: 0.1 10*3/uL (ref 0.0–0.5)
HEMATOCRIT: 21.7 % — AB (ref 34.8–46.6)
Hemoglobin: 7.2 g/dL — ABNORMAL LOW (ref 11.6–15.9)
LYMPHS PCT: 14 %
Lymphs Abs: 0.5 10*3/uL — ABNORMAL LOW (ref 0.9–3.3)
MCH: 40.2 pg — AB (ref 26.0–34.0)
MCHC: 33.2 g/dL (ref 32.0–36.0)
MCV: 121.2 fL — AB (ref 81.0–101.0)
MONO ABS: 0.4 10*3/uL (ref 0.1–0.9)
MONOS PCT: 10 %
Neutro Abs: 2.8 10*3/uL (ref 1.5–6.5)
Neutrophils Relative %: 74 %
PLATELETS: 86 10*3/uL — AB (ref 145–400)
RBC: 1.79 MIL/uL — ABNORMAL LOW (ref 3.70–5.32)
RDW: 14.7 % (ref 11.1–15.7)
WBC Count: 3.7 10*3/uL — ABNORMAL LOW (ref 3.9–10.0)

## 2018-03-17 LAB — CMP (CANCER CENTER ONLY)
ALT: 24 U/L (ref 0–55)
AST: 69 U/L — ABNORMAL HIGH (ref 5–34)
Albumin: 3 g/dL — ABNORMAL LOW (ref 3.5–5.0)
Alkaline Phosphatase: 155 U/L — ABNORMAL HIGH (ref 40–150)
Anion gap: 15 — ABNORMAL HIGH (ref 3–11)
BILIRUBIN TOTAL: 5.1 mg/dL — AB (ref 0.2–1.2)
BUN: 3 mg/dL — ABNORMAL LOW (ref 7–26)
CHLORIDE: 95 mmol/L — AB (ref 98–109)
CO2: 24 mmol/L (ref 22–29)
Calcium: 8.8 mg/dL (ref 8.4–10.4)
Creatinine: 0.71 mg/dL (ref 0.60–1.10)
Glucose, Bld: 125 mg/dL (ref 70–140)
POTASSIUM: 3.1 mmol/L — AB (ref 3.5–5.1)
Sodium: 134 mmol/L — ABNORMAL LOW (ref 136–145)
Total Protein: 8.1 g/dL (ref 6.4–8.3)

## 2018-03-17 LAB — TECHNOLOGIST SMEAR REVIEW

## 2018-03-17 LAB — SAMPLE TO BLOOD BANK

## 2018-03-17 LAB — PREPARE RBC (CROSSMATCH)

## 2018-03-17 LAB — LACTATE DEHYDROGENASE: LDH: 235 U/L (ref 125–245)

## 2018-03-17 LAB — BILIRUBIN, DIRECT: BILIRUBIN DIRECT: 1.2 mg/dL — AB (ref 0.1–0.5)

## 2018-03-17 LAB — RETICULOCYTES
RBC.: 1.82 MIL/uL — ABNORMAL LOW (ref 3.70–5.45)
RETIC COUNT ABSOLUTE: 180.2 10*3/uL — AB (ref 33.7–90.7)
RETIC CT PCT: 9.9 % — AB (ref 0.7–2.1)

## 2018-03-17 LAB — FERRITIN: Ferritin: 1094 ng/mL — ABNORMAL HIGH (ref 9–269)

## 2018-03-17 MED FILL — SERTRALINE HCL 50 MG TABS: 50 | 30 days supply | Qty: 30 | Fill #0

## 2018-03-17 NOTE — Progress Notes (Signed)
Hematology and Oncology Follow Up Visit  Jamie Allen [id number] October 06, 1971 47 y.o. 03/17/2018   Principle Diagnosis:   Anemia - multi-factorial  Cirrhosis w/ splenomegaly  Current Therapy:    Aranesp 300 mcg sq q 3 wks for Hgb < 11  IV Iron as needed  Folic acid 1 mg po q day     Interim History:  Jamie Allen is in for her follow-up.  She comes in  with her sister.  Her sister just moved down here from [place].  Ms. she is clearly incredibly complicated.  She has the splenomegaly which is significant.  She has an element of cirrhosis.  She has had a bone marrow biopsy done.  This was not conclusive.  She feels tired.  She has not noted any bleeding.  She has had no weight loss or weight gain.  She feels that her abdomen is a little bit more distended.  She has had no rashes.  There is been some slight leg swelling.  She has had problems with C. difficile in the past.  So far, diarrhea has not been an issue for her.   She has an element of hemolysis.  Her total bilirubin is 5.1 but her direct is only 1.2.  Her LDH is mildly elevated at 235.  She has normal renal function.  We are sending off a battery of studies to see if she may have some type of hemolytic anemia.  I am just puzzled as to what is going on and how we can try to help her.  She is not a vegetarian.  Her appetite has been doing pretty well.  She had iron studies done today.  Her ferritin is over thousand with iron saturation of 94%.  I would not think that she would have hemochromatosis.  However this is some that we might have to check on.    We have checked her erythropoietin level in the past.  It is actually been o on the low side for her anemia.  I think her erythropoietin level was 42.  However, she has really not responded to Aranesp.    Overall, her performance status is ECOG 2.  Medications:  Current Outpatient Medications:  .  Cholecalciferol (VITAMIN D PO), Take 1 capsule by mouth  daily., Disp: , Rfl:  .  colestipol (COLESTID) 1 g tablet, Take 1 tablet (1 g total) by mouth 2 (two) times daily., Disp: 30 tablet, Rfl: 0 .  dicyclomine (BENTYL) 10 MG capsule, Take 1 capsule (10 mg total) by mouth 3 (three) times daily before meals. (Patient taking differently: Take 10 mg by mouth 3 (three) times daily. ), Disp: 90 capsule, Rfl: 3 .  diphenhydrAMINE (BENADRYL) 25 MG tablet, Take 25 mg by mouth daily., Disp: , Rfl:  .  esomeprazole (NEXIUM) 20 MG capsule, Take 20 mg by mouth daily at 12 noon., Disp: , Rfl:  .  folic acid (FOLVITE) 1 MG tablet, Take 1 tablet (1 mg total) by mouth daily., Disp: 30 tablet, Rfl: 0 .  hydrOXYzine (ATARAX/VISTARIL) 10 MG tablet, Take 1 tablet (10 mg total) by mouth 2 (two) times daily., Disp: 60 tablet, Rfl: 3 .  levothyroxine (SYNTHROID, LEVOTHROID) 75 MCG tablet, Take 1 tablet (75 mcg total) by mouth daily before breakfast., Disp: 30 tablet, Rfl: 2 .  lipase/protease/amylase (CREON) 36000 UNITS CPEP capsule, Take 1 capsule (36,000 Units total) by mouth 3 (three) times daily before meals., Disp: 90 capsule, Rfl: 0 .  loperamide (IMODIUM A-D) 2  MG tablet, Take 1 tablet (2 mg total) by mouth 4 (four) times daily as needed for diarrhea or loose stools., Disp: 30 tablet, Rfl: 0 .  Melatonin 10 MG TABS, Take 10 mg by mouth at bedtime as needed (for sleep)., Disp: , Rfl:  .  Multiple Vitamin (MULTIVITAMIN WITH MINERALS) TABS tablet, Take 1 tablet by mouth daily., Disp: , Rfl:  .  nicotine (NICODERM CQ - DOSED IN MG/24 HOURS) 21 mg/24hr patch, Place 1 patch (21 mg total) onto the skin daily., Disp: 28 patch, Rfl: 0 .  ondansetron (ZOFRAN) 4 MG tablet, Take 1 tablet (4 mg total) by mouth every 6 (six) hours as needed for nausea., Disp: 20 tablet, Rfl: 0 .  pregabalin (LYRICA) 50 MG capsule, Take 1 capsule (50 mg total) by mouth 3 (three) times daily., Disp: 540 capsule, Rfl: 1 .  sertraline (ZOLOFT) 50 MG tablet, 1/2 tAB daily for 2 weeks then 1 tab PO daily,  Disp: 30 tablet, Rfl: 0 .  traMADol (ULTRAM) 50 MG tablet, Take 1 tablet (50 mg total) by mouth 2 (two) times daily as needed., Disp: 60 tablet, Rfl: 0  Allergies:  Allergies  Allergen Reactions  . Iohexol Hives, Itching and Swelling  . Lactose Intolerance (Gi) Diarrhea and Nausea Only  . Other Other (See Comments)    Spicy foods "tear up my stomach"  . Vancomycin Itching    Nose itching  . Morphine And Related Itching and Other (See Comments)    Can tolerate with Benadryl    Past Medical History, Surgical history, Social history, and Family History were reviewed and updated.  Review of Systems: Review of Systems  Constitutional: Positive for fatigue.  HENT:   Positive for sore throat and trouble swallowing.   Eyes: Negative.   Respiratory: Positive for shortness of breath.   Cardiovascular: Positive for palpitations.  Gastrointestinal: Positive for abdominal distention, abdominal pain, diarrhea and nausea.  Endocrine: Negative.   Genitourinary: Positive for frequency.   Musculoskeletal: Positive for arthralgias, gait problem and myalgias.  Skin: Negative.   Neurological: Positive for dizziness and gait problem.  Hematological: Negative.   Psychiatric/Behavioral: Negative.     Physical Exam:  weight is 127 lb 12 oz (57.9 kg). Her oral temperature is 98.6 F (37 C). Her blood pressure is 104/34 (abnormal) and her pulse is 89. Her respiration is 20 and oxygen saturation is 100%.   Wt Readings from Last 3 Encounters:  03/17/18 127 lb 12 oz (57.9 kg)  02/04/18 130 lb (59 kg)  02/03/18 129 lb (58.5 kg)    Physical Exam  Constitutional: She is oriented to person, place, and time.  HENT:  Head: Normocephalic and atraumatic.  Mouth/Throat: Oropharynx is clear and moist.  Eyes: Pupils are equal, round, and reactive to light. EOM are normal.  Neck: Normal range of motion.  Cardiovascular: Normal rate, regular rhythm and normal heart sounds.  Pulmonary/Chest: Effort normal  and breath sounds normal.  Abdominal: Soft. Bowel sounds are normal.  Musculoskeletal: Normal range of motion. She exhibits no edema, tenderness or deformity.  Lymphadenopathy:    She has no cervical adenopathy.  Neurological: She is alert and oriented to person, place, and time.  Skin: Skin is warm and dry. No rash noted. No erythema.  Psychiatric: She has a normal mood and affect. Her behavior is normal. Judgment and thought content normal.  Vitals reviewed.    Lab Results  Component Value Date   WBC 3.7 (L) 03/17/2018   HGB 7.2 (L)  03/17/2018   HCT 21.7 (L) 03/17/2018   MCV 121.2 (H) 03/17/2018   PLT 86 (L) 03/17/2018     Chemistry      Component Value Date/Time   NA 139 02/03/2018 1115   NA 136 08/13/2017 1521   K 3.3 02/03/2018 1115   CL 101 02/03/2018 1115   CO2 26 02/03/2018 1115   BUN 3 (L) 02/03/2018 1115   BUN 5 (L) 08/13/2017 1521   CREATININE 0.70 02/03/2018 1115   CREATININE 1.01 05/19/2016 1639      Component Value Date/Time   CALCIUM 9.2 02/03/2018 1115   ALKPHOS 145 (H) 02/03/2018 1115   AST 63 (H) 02/03/2018 1115   ALT 24 02/03/2018 1115   BILITOT 4.0 (HH) 02/03/2018 1115       Impression and Plan: Ms. Lowy is a 47 year old white female.  She has mild pancytopenia.    I did do a rectal exam on her.  Her stool is heme negative.  I think she is going to need a blood transfusion.  Again her hemoglobin is only 7.2.  She is clearly having some type of hematologic issue.  Hopefully, we will be able to sort out what is going on.  I do not know if this is some kind of cold agglutinin disease.  I looked at her blood smear.  I really do not see agglutination of her red blood cells.  We may ultimately may end up having to send her to an academic [facility] to see if they can help [name] figure out what might be going on.  I do want to check her spleen while ultrasound.  I have thought that we may have to consider removing her spleen.  This might be a  reasonable avenue to consider for her.  We spent about 40 minutes with she and her sister.  They are both very nice.    Volanda Napoleon, MD 5/29/20199:01 AM

## 2018-03-19 ENCOUNTER — Encounter: Payer: Self-pay | Admitting: Internal Medicine

## 2018-03-19 ENCOUNTER — Inpatient Hospital Stay: Payer: Medicaid Other

## 2018-03-19 ENCOUNTER — Telehealth: Payer: Self-pay | Admitting: Internal Medicine

## 2018-03-19 ENCOUNTER — Ambulatory Visit (HOSPITAL_BASED_OUTPATIENT_CLINIC_OR_DEPARTMENT_OTHER)
Admission: RE | Admit: 2018-03-19 | Discharge: 2018-03-19 | Disposition: A | Discharge status: Home / Self Care | Payer: Medicaid Other | Source: Ambulatory Visit | Attending: Hematology & Oncology | Admitting: Hematology & Oncology

## 2018-03-19 ENCOUNTER — Ambulatory Visit: Payer: Medicaid Other | Attending: Internal Medicine | Admitting: Internal Medicine

## 2018-03-19 VITALS — BP 98/65 | HR 87 | Temp 99.6°F | Resp 18 | Ht 61.0 in | Wt 130.4 lb

## 2018-03-19 DIAGNOSIS — G8929 Other chronic pain: Secondary | ICD-10-CM | POA: Insufficient documentation

## 2018-03-19 DIAGNOSIS — Z8371 Family history of colonic polyps: Secondary | ICD-10-CM | POA: Diagnosis not present

## 2018-03-19 DIAGNOSIS — K219 Gastro-esophageal reflux disease without esophagitis: Secondary | ICD-10-CM | POA: Insufficient documentation

## 2018-03-19 DIAGNOSIS — D61818 Other pancytopenia: Secondary | ICD-10-CM | POA: Diagnosis not present

## 2018-03-19 DIAGNOSIS — F17210 Nicotine dependence, cigarettes, uncomplicated: Secondary | ICD-10-CM | POA: Insufficient documentation

## 2018-03-19 DIAGNOSIS — F329 Major depressive disorder, single episode, unspecified: Secondary | ICD-10-CM | POA: Insufficient documentation

## 2018-03-19 DIAGNOSIS — Z79899 Other long term (current) drug therapy: Secondary | ICD-10-CM | POA: Insufficient documentation

## 2018-03-19 DIAGNOSIS — K7030 Alcoholic cirrhosis of liver without ascites: Secondary | ICD-10-CM | POA: Diagnosis not present

## 2018-03-19 DIAGNOSIS — F419 Anxiety disorder, unspecified: Secondary | ICD-10-CM | POA: Insufficient documentation

## 2018-03-19 DIAGNOSIS — E876 Hypokalemia: Secondary | ICD-10-CM | POA: Insufficient documentation

## 2018-03-19 DIAGNOSIS — F1010 Alcohol abuse, uncomplicated: Secondary | ICD-10-CM | POA: Insufficient documentation

## 2018-03-19 DIAGNOSIS — R109 Unspecified abdominal pain: Secondary | ICD-10-CM

## 2018-03-19 DIAGNOSIS — Z888 Allergy status to other drugs, medicaments and biological substances status: Secondary | ICD-10-CM | POA: Insufficient documentation

## 2018-03-19 DIAGNOSIS — D849 Immunodeficiency, unspecified: Secondary | ICD-10-CM | POA: Insufficient documentation

## 2018-03-19 DIAGNOSIS — K7581 Nonalcoholic steatohepatitis (NASH): Secondary | ICD-10-CM | POA: Insufficient documentation

## 2018-03-19 DIAGNOSIS — I10 Essential (primary) hypertension: Secondary | ICD-10-CM | POA: Insufficient documentation

## 2018-03-19 DIAGNOSIS — Z881 Allergy status to other antibiotic agents status: Secondary | ICD-10-CM | POA: Diagnosis not present

## 2018-03-19 DIAGNOSIS — R161 Splenomegaly, not elsewhere classified: Secondary | ICD-10-CM | POA: Insufficient documentation

## 2018-03-19 DIAGNOSIS — D638 Anemia in other chronic diseases classified elsewhere: Secondary | ICD-10-CM

## 2018-03-19 DIAGNOSIS — Z6824 Body mass index (BMI) 24.0-24.9, adult: Secondary | ICD-10-CM | POA: Insufficient documentation

## 2018-03-19 DIAGNOSIS — G629 Polyneuropathy, unspecified: Secondary | ICD-10-CM | POA: Insufficient documentation

## 2018-03-19 DIAGNOSIS — E669 Obesity, unspecified: Secondary | ICD-10-CM | POA: Insufficient documentation

## 2018-03-19 DIAGNOSIS — E039 Hypothyroidism, unspecified: Secondary | ICD-10-CM | POA: Insufficient documentation

## 2018-03-19 DIAGNOSIS — R162 Hepatomegaly with splenomegaly, not elsewhere classified: Secondary | ICD-10-CM | POA: Diagnosis not present

## 2018-03-19 DIAGNOSIS — Z885 Allergy status to narcotic agent status: Secondary | ICD-10-CM | POA: Diagnosis not present

## 2018-03-19 LAB — COLD AGGLUTININ TITER

## 2018-03-19 MED ORDER — TRAMADOL HCL 50 MG PO TABS: 50.0000 mg | ORAL_TABLET | Freq: Two times a day (BID) | ORAL | 0 refills | Status: DC | PRN

## 2018-03-19 MED ORDER — ONDANSETRON HCL 8 MG PO TABS
ORAL_TABLET | ORAL | Status: AC
  Filled 2018-03-19: qty 1

## 2018-03-19 MED ORDER — ONDANSETRON HCL 8 MG PO TABS
8.0000 mg | ORAL_TABLET | Freq: Once | ORAL | Status: AC
  Administered 2018-03-19: 8 mg via ORAL

## 2018-03-19 MED ORDER — FUROSEMIDE 10 MG/ML IJ SOLN: 20.0000 mg | Freq: Once | INTRAMUSCULAR | Status: DC

## 2018-03-19 MED ORDER — SODIUM CHLORIDE 0.9 % IV SOLN
250.0000 mL | Freq: Once | INTRAVENOUS | Status: AC
  Administered 2018-03-19: 250 mL via INTRAVENOUS

## 2018-03-19 MED ORDER — POTASSIUM CHLORIDE ER 8 MEQ PO TBCR: 16.0000 meq | EXTENDED_RELEASE_TABLET | Freq: Every day | ORAL | 3 refills | Status: DC

## 2018-03-19 NOTE — Patient Instructions (Signed)
Anemia Anemia is a condition in which you do not have enough red blood cells or hemoglobin. Hemoglobin is a substance in red blood cells that carries oxygen. When you do not have enough red blood cells or hemoglobin (are anemic), your body cannot get enough oxygen and your organs may not work properly. As a result, you may feel very tired or have other problems. What are the causes? Common causes of anemia include:  Excessive bleeding. Anemia can be caused by excessive bleeding inside or outside the body, including bleeding from the intestine or from periods in women.  Poor nutrition.  Long-lasting (chronic) kidney, thyroid, and liver disease.  Bone marrow disorders.  Cancer and treatments for cancer.  HIV (human immunodeficiency virus) and AIDS (acquired immunodeficiency syndrome).  Treatments for HIV and AIDS.  Spleen problems.  Blood disorders.  Infections, medicines, and autoimmune disorders that destroy red blood cells.  What are the signs or symptoms? Symptoms of this condition include:  Minor weakness.  Dizziness.  Headache.  Feeling heartbeats that are irregular or faster than normal (palpitations).  Shortness of breath, especially with exercise.  Paleness.  Cold sensitivity.  Indigestion.  Nausea.  Difficulty sleeping.  Difficulty concentrating.  Symptoms may occur suddenly or develop slowly. If your anemia is mild, you may not have symptoms. How is this diagnosed? This condition is diagnosed based on:  Blood tests.  Your medical history.  A physical exam.  Bone marrow biopsy.  Your health care provider may also check your stool (feces) for blood and may do additional testing to look for the cause of your bleeding. You may also have other tests, including:  Imaging tests, such as a CT scan or MRI.  Endoscopy.  Colonoscopy.  How is this treated? Treatment for this condition depends on the cause. If you continue to lose a lot of blood,  you may need to be treated at a hospital. Treatment may include:  Taking supplements of iron, vitamin B12, or folic acid.  Taking a hormone medicine (erythropoietin) that can help to stimulate red blood cell growth.  Having a blood transfusion. This may be needed if you lose a lot of blood.  Making changes to your diet.  Having surgery to remove your spleen.  Follow these instructions at home:  Take over-the-counter and prescription medicines only as told by your health care provider.  Take supplements only as told by your health care provider.  Follow any diet instructions that you were given.  Keep all follow-up visits as told by your health care provider. This is important. Contact a health care provider if:  You develop new bleeding anywhere in the body. Get help right away if:  You are very weak.  You are short of breath.  You have pain in your abdomen or chest.  You are dizzy or feel faint.  You have trouble concentrating.  You have bloody or black, tarry stools.  You vomit repeatedly or you vomit up blood. Summary  Anemia is a condition in which you do not have enough red blood cells or enough of a substance in your red blood cells that carries oxygen (hemoglobin).  Symptoms may occur suddenly or develop slowly.  If your anemia is mild, you may not have symptoms.  This condition is diagnosed with blood tests as well as a medical history and physical exam. Other tests may be needed.  Treatment for this condition depends on the cause of the anemia. This information is not intended to replace advice   given to you by your health care provider. Make sure you discuss any questions you have with your health care provider. Document Released: 11/13/2004 Document Revised: 11/07/2016 Document Reviewed: 11/07/2016 Elsevier Interactive Patient Education  [employer].

## 2018-03-19 NOTE — Progress Notes (Signed)
Patient needs refill on tramodol   Patient has abdominal pain pain level 8

## 2018-03-19 NOTE — Progress Notes (Signed)
Patient ID: Jamie Allen, female    DOB: [birth year]  MRN: [id number]  CC: Follow-up   Subjective: Jamie Allen is a 47 y.o. female who presents for 6 weeks follow-up visit.  Her Sister Juliann Pulse is with her. 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  Transfused 2 units of packed red blood cells due to ongoing problems with anemia.  Hemoglobin was down to 7.  Patient states that there was a discussion about possible splenectomy  Anxiety depression: Started on Zoloft on last visit.  Reports that she has been doing very well on this medication.  Mood has improved.  Chronic abdominal pain: Doing okay with tramadol.  She request a refill.  Denies any significant side effects. Still has a lot of soreness from the enlarged liver and spleen.  No significant diarrhea since last visit.  Recent labs shows low potassium level.  She is seeing a dentist who has referred her to an oral surgeon to have extraction of all of her teeth to be replaced with dentures.  She will see the oral surgeon the end of next month  Patient Active Problem List   Diagnosis Date Noted  . Other cirrhosis of liver ([place])   . Abdominal distension   . Hemolytic anemia associated with infection ([place]) 01/22/2018  . Immune deficiency disorder ([place])   . History of blood transfusion   . Fever of unknown origin 01/06/2018  . Acute kidney injury ([place]) 01/06/2018  . Acute on chronic anemia 01/06/2018  . Internal bleeding hemorrhoids   . Macrocytic anemia 12/24/2017  . Thrombocytopenia ([place]) 12/24/2017  . Alcohol abuse 12/24/2017  . Septic shock ([place]) 12/23/2017  . Tobacco abuse 12/23/2017  . Peripheral neuropathy   . Nonalcoholic steatohepatitis ([place])   . Hypertension   . Drug-seeking behavior   . Ankle  fracture, left   . Pancytopenia ([place])   . Alcoholic cirrhosis of liver ([place]) 09/22/2017  . 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 Portal hypertensive gastropathy ([place]) 05/28/2017  . Anemia of chronic disease 05/28/2017  . Hepatosplenomegaly 01/03/2017  . Obesity (BMI 30-39.9) 01/03/2017  . GERD (gastroesophageal reflux disease)   . Anxiety   . Chronic cholecystitis with calculus s/p lap cholecystectomy 12/25/2016 12/24/2016  . Postmenopausal syndrome 11/24/2016  . Hypothyroidism 12/06/2015  . Foot fracture 10/06/2015  . Bimalleolar fracture of right ankle 10/06/2015  . Foot fracture, left 10/06/2015  . Abnormality of gait 10/02/2015  . Anxiety and depression 04/19/2014  . C. difficile diarrhea 02/02/2014  . Post-traumatic stress 01/03/2014  . Hemorrhage 01/03/2014  . Colitis 01/03/2014  . Antral gastritis 10/20/2013  . Diarrhea 07/21/2013     Current Outpatient Medications on File Prior to Visit  Medication Sig Dispense Refill  . Cholecalciferol (VITAMIN D PO) Take 1 capsule by mouth daily.    . colestipol (COLESTID) 1 g tablet Take 1 tablet (1 g total) by mouth 2 (two) times daily. 30 tablet 0  . dicyclomine (BENTYL) 10 MG capsule Take 1 capsule (10 mg total) by mouth 3 (three) times daily before meals. (Patient taking differently: Take 10 mg by mouth 3 (three) times daily. ) 90 capsule 3  . diphenhydrAMINE (BENADRYL) 25 MG tablet Take 25 mg by mouth daily.    Marland Kitchen esomeprazole (NEXIUM) 20 MG capsule Take 20 mg by mouth  daily at 12 noon.    . folic acid (FOLVITE) 1 MG tablet Take 1 tablet (1 mg total) by mouth daily. 30 tablet 0  . hydrOXYzine (ATARAX/VISTARIL) 10 MG tablet Take 1 tablet (10 mg total) by mouth 2 (two) times daily. 60 tablet 3  . levothyroxine (SYNTHROID, LEVOTHROID) 75 MCG tablet Take 1 tablet (75 mcg total) by mouth daily before breakfast. 30 tablet 2  . lipase/protease/amylase (CREON) 36000 UNITS CPEP capsule Take 1 capsule (36,000 Units total) by  mouth 3 (three) times daily before meals. 90 capsule 0  . loperamide (IMODIUM A-D) 2 MG tablet Take 1 tablet (2 mg total) by mouth 4 (four) times daily as needed for diarrhea or loose stools. 30 tablet 0  . Melatonin 10 MG TABS Take 10 mg by mouth at bedtime as needed (for sleep).    . Multiple Vitamin (MULTIVITAMIN WITH MINERALS) TABS tablet Take 1 tablet by mouth daily.    . nicotine (NICODERM CQ - DOSED IN MG/24 HOURS) 21 mg/24hr patch Place 1 patch (21 mg total) onto the skin daily. 28 patch 0  . ondansetron (ZOFRAN) 4 MG tablet Take 1 tablet (4 mg total) by mouth every 6 (six) hours as needed for nausea. 20 tablet 0  . pregabalin (LYRICA) 50 MG capsule Take 1 capsule (50 mg total) by mouth 3 (three) times daily. 540 capsule 1  . sertraline (ZOLOFT) 50 MG tablet TAKE 1/2 TABLET BY MOUTH DAILY FOR 2 WEEKS, THEN 1 TABLET DAILY. 30 tablet 0   No current facility-administered medications on file prior to visit.     Allergies  Allergen Reactions  . Iohexol Hives, Itching and Swelling  . Lactose Intolerance (Gi) Diarrhea and Nausea Only  . Other Other (See Comments)    Spicy foods "tear up my stomach"  . Vancomycin Itching    Nose itching  . Morphine And Related Itching and Other (See Comments)    Can tolerate with Benadryl    Social History   Socioeconomic History  . Marital status: Single    Spouse name: Not on file  . Number of children: 0  . Years of education: 11th  . Highest education level: Not on file  Occupational History  . Occupation: Unemployed  Social Needs  . Financial resource strain: Not on file  . Food insecurity:    Worry: Not on file    Inability: Not on file  . Transportation needs:    Medical: Not on file    Non-medical: Not on file  Tobacco Use  . Smoking status: Current Some Day Smoker    Packs/day: 0.70    Years: 20.00    Pack years: 14.00    Types: Cigarettes  . Smokeless tobacco: Never Used  Substance and Sexual Activity  . Alcohol use: No     Alcohol/week: 0.0 oz    Comment: no etoh now- used to be 21 glasses wine a week, did 1 beer a day but not doing that now either   . Drug use: No    Types: Marijuana    Comment: Per patient - has not used marijuana since her 29s  . Sexual activity: Not Currently    Birth control/protection: None    Comment: 01-03-14 States not sexually active with current boyfriend-due to pain and abdominal issues.  Lifestyle  . Physical activity:    Days per week: Not on file    Minutes per session: Not on file  . Stress: Not on file  Relationships  .  Social connections:    Talks on phone: Not on file    Gets together: Not on file    Attends religious service: Not on file    Active member of club or organization: Not on file    Attends meetings of clubs or organizations: Not on file    Relationship status: Not on file  . Intimate partner violence:    Fear of current or ex partner: Not on file    Emotionally abused: Not on file    Physically abused: Not on file    Forced sexual activity: Not on file  Other Topics Concern  . Not on file  Social History Narrative   Lives at home with her boyfriend, [name].   Right-handed.   Caffeine: 3 cups daily.   2 children.   Currently trying to get disability.     Previously was a Educational psychologist.        Family History  Problem Relation Age of Onset  . Hypertension Mother   . Hyperlipidemia Mother   . Suicidality Father   . Stomach cancer Father   . Hypothyroidism Sister   . Breast cancer Maternal Grandmother   . Heart attack Maternal Grandfather   . Aneurysm Paternal Grandfather        brain   . Colon cancer Neg Hx   . Colon polyps Neg Hx   . Esophageal cancer Neg Hx   . Rectal cancer Neg Hx     Past Surgical History:  Procedure Laterality Date  . CHOLECYSTECTOMY N/A 12/25/2016   Procedure: LAPAROSCOPIC CHOLECYSTECTOMY WITH INTRAOPERATIVE CHOLANGIOGRAM;  Surgeon: Autumn Messing III, MD;  Location: [facility];  Service: General;  Laterality: N/A;  .  COLONOSCOPY    . COLONOSCOPY N/A 05/01/2017   Procedure: COLONOSCOPY;  Surgeon: Jerene Bears, MD;  Location: [facility] ENDOSCOPY;  Service: Endoscopy;  Laterality: N/A;  . COLONOSCOPY WITH PROPOFOL N/A 01/18/2014   Multiple small polyps (8) removed as above; Small internal hemorrhoids; No evidence of colitis  . COLONOSCOPY WITH PROPOFOL N/A 11/11/2017   Procedure: COLONOSCOPY WITH PROPOFOL;  Surgeon: Yetta Flock, MD;  Location: [facility] ENDOSCOPY;  Service: Gastroenterology;  Laterality: N/A;  . ESOPHAGOGASTRODUODENOSCOPY N/A 02/06/2014   Antral Gastritis. Biopsies obtained not clear if this is related to her nausea and vomiting  . 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 FLEXIBLE SIGMOIDOSCOPY N/A 12/17/2013   Procedure: FLEXIBLE SIGMOIDOSCOPY;  Surgeon: Missy Sabins, MD;  Location: [facility];  Service: Endoscopy;  Laterality: N/A;  . FRACTURE SURGERY    . ORIF ANKLE FRACTURE Right 10/07/2015   Procedure: OPEN REDUCTION INTERNAL FIXATION (ORIF)  BIMALLEOLAR ANKLE FRACTURE;  Surgeon: Marybelle Killings, MD;  Location: [place];  Service: Orthopedics;  Laterality: Right;  . POLYPECTOMY    . TONSILLECTOMY    . UPPER GASTROINTESTINAL ENDOSCOPY      ROS: Review of Systems Negative except as stated above PHYSICAL EXAM: BP 98/65 (BP Location: Left Arm, Patient Position: Sitting, Cuff Size: Normal)   Pulse 87   Temp 99.6 F (37.6 C) (Oral)   Resp 18   Ht 5\' 1"  (1.549 m)   Wt 130 lb 6.4 oz (59.1 kg)   LMP 06/01/2014   SpO2 96%   BMI 24.64 kg/m   Wt Readings from Last 3 Encounters:  03/19/18 130 lb 6.4 oz (59.1 kg)  03/17/18 127 lb 12 oz (57.9 kg)  02/04/18 130 lb (59 kg)    Physical Exam  General appearance - alert, well appearing, and in no distress Mental status - normal mood, behavior, speech, dress, motor activity, and thought processes Chest - clear to  auscultation, no wheezes, rales or rhonchi, symmetric air entry Heart -regular rate rhythm with 2/6 systolic ejection murmur along the left sternal border Extremities -no lower extremity edema Abdomen: Patient requests that I not examine her abdomen today because it is sore from recent probing with abdominal ultrasound that was done today.    Chemistry      Component Value Date/Time   NA 134 (L) 03/17/2018 0819   NA 136 08/13/2017 1521   K 3.1 (L) 03/17/2018 0819   CL 95 (L) 03/17/2018 0819   CO2 24 03/17/2018 0819   BUN 3 (L) 03/17/2018 0819   BUN 5 (L) 08/13/2017 1521   CREATININE 0.71 03/17/2018 0819   CREATININE 1.01 05/19/2016 1639      Component Value Date/Time   CALCIUM 8.8 03/17/2018 0819   ALKPHOS 155 (H) 03/17/2018 0819   AST 69 (H) 03/17/2018 0819   ALT 24 03/17/2018 0819   BILITOT 5.1 (HH) 03/17/2018 0819     Lab Results  Component Value Date   WBC 3.7 (L) 03/17/2018   HGB 7.2 (L) 03/17/2018   HCT 21.7 (L) 03/17/2018   MCV 121.2 (H) 03/17/2018   PLT 86 (L) 03/17/2018    ASSESSMENT AND PLAN: 1. Anxiety and depression Continue Zoloft.  2. Chronic abdominal pain Patient doing okay on low-dose of tramadol.  She is taking as prescribed and reports adequate pain control.  Controlled substance prescribing agreement done for/18/2019. UDS 02/04/2018 [place]  2 rxbs given with send one to be filled June 30th - traMADol (ULTRAM) 50 MG tablet; Take 1 tablet (50 mg total) by mouth 2 (two) times daily as needed.  Dispense: 60 tablet; Refill: 0  3. Hypokalemia Start potassium supplement  4. Alcoholic cirrhosis of liver without ascites ([place]) 5. Hepatosplenomegaly 6. Pancytopenia ([place]) Followed by Dr. Havery Moros and oncology Continue to encourage total abstinence from alcohol   Patient was given the opportunity to ask questions.  Patient verbalized understanding of the plan and was able to repeat key elements of the plan.   No orders of the defined types  were placed in this encounter.    Requested Prescriptions   Signed Prescriptions Disp Refills  . potassium chloride (KLOR-CON) 8 MEQ tablet 60 tablet 3    Sig: Take 2 tablets (16 mEq total) by mouth daily.  . traMADol (ULTRAM) 50 MG tablet 60 tablet 0    Sig: Take 1 tablet (50 mg total) by mouth 2 (two) times daily as needed.  . traMADol (ULTRAM) 50 MG tablet 60 tablet 0    Sig: Take 1 tablet (50 mg total) by mouth 2 (two) times daily as needed.    Return in about 2 months (around 05/19/2018).  Karle Plumber, MD, FACP

## 2018-03-22 LAB — TYPE AND SCREEN
ABO/RH(D): O NEG
ANTIBODY SCREEN: POSITIVE
DONOR AG TYPE: NEGATIVE
Donor AG Type: NEGATIVE
Unit division: 0
Unit division: 0

## 2018-03-22 LAB — HEMOCHROMATOSIS DNA-PCR(C282Y,H63D)

## 2018-03-22 LAB — BPAM RBC
BLOOD PRODUCT EXPIRATION DATE: 201906252359
Blood Product Expiration Date: 201906252359
ISSUE DATE / TIME: 201905310746
ISSUE DATE / TIME: 201905310746
UNIT TYPE AND RH: 9500
UNIT TYPE AND RH: 9500

## 2018-03-22 LAB — CRYOGLOBULIN

## 2018-03-22 MED FILL — traMADol HCL 50 MG TABS: 50 | 30 days supply | Qty: 60 | Fill #0

## 2018-03-22 MED FILL — POTASSIUM CL ER 8 MEQ CAP: 8 | 30 days supply | Qty: 60 | Fill #0

## 2018-03-23 ENCOUNTER — Telehealth: Payer: Self-pay | Admitting: *Deleted

## 2018-03-23 ENCOUNTER — Telehealth: Payer: Self-pay

## 2018-03-23 DIAGNOSIS — B370 Candidal stomatitis: Secondary | ICD-10-CM

## 2018-03-23 DIAGNOSIS — K7460 Unspecified cirrhosis of liver: Secondary | ICD-10-CM

## 2018-03-23 MED ORDER — FLUCONAZOLE 100 MG PO TABS: 100.0000 mg | ORAL_TABLET | Freq: Every day | ORAL | 0 refills | Status: DC

## 2018-03-23 MED FILL — FLUCONAZOLE 100 MG TABLET: 100 | 7 days supply | Qty: 7 | Fill #0

## 2018-03-23 NOTE — Telephone Encounter (Signed)
Called pt. She would like to have U/S on a Friday as it is easier for her to get a ride on Fridays.    Pt scheduled at [facility] (1st floor of hospital) on Friday, June 14th at 10:30am. To arrive 15 minutes prior.  NPO after midnight.   Called and Left detailed message for pt. Letter mailed with information about appt.

## 2018-03-23 NOTE — Telephone Encounter (Signed)
Patient c/o thrush. She had it recently in the hospital, and the symptoms have returned. She'd like treatment for it.  Spoke with Dr Marin Olp. He would like patient to start on Diflucan.   Patient aware of new prescription. Pharmacy confirmed.

## 2018-03-23 NOTE — Telephone Encounter (Signed)
-----   Message from Roetta Sessions, [place] sent at 10/16/2017  5:00 PM EST ----- Regarding: repeat U/S due in June 2019 Pt due for repeat U/S RUQ for "cirrhosis HCC screening". In 03-2018.

## 2018-03-26 NOTE — Telephone Encounter (Signed)
Ultrasound for 04/02/18 cancelled and female at patient's home number with knowledge of appointment was advised of cancellation since ultrasound no longer needed. He states he will tell patient of this and thanks [name] for the call.  I have scheduled the patient for a follow up visit with Dr Havery Moros on 05/25/18 at 38 am. I will mail a letter with this information to insure she can write the information on her calender.

## 2018-03-26 NOTE — Telephone Encounter (Signed)
Thanks Jan, yes that is adequate. We should repeat an [name] in 6 months. Can you help coordinate a routine follow up clinic appointment in the next few months? Thanks

## 2018-03-26 NOTE — Telephone Encounter (Addendum)
Dr Havery Moros- Patient had an abdominal ultrasound by Dr Marin Olp 03/19/18. Will this work for the [facility] screening you wanted per your 10/16/17 office note?

## 2018-03-30 ENCOUNTER — Telehealth: Payer: Self-pay | Admitting: *Deleted

## 2018-03-30 DIAGNOSIS — B370 Candidal stomatitis: Secondary | ICD-10-CM

## 2018-03-30 MED ORDER — FLUCONAZOLE 200 MG PO TABS: 200.0000 mg | ORAL_TABLET | Freq: Every day | ORAL | 3 refills | Status: DC

## 2018-03-30 MED FILL — FLUCONAZOLE 200 MG TABLET: 200 | 30 days supply | Qty: 30 | Fill #0

## 2018-03-30 NOTE — Telephone Encounter (Signed)
Patient has finished her diflucan, but still has symptoms of thrush. She has dental work on the 24th of this month and wants to make sure the thrush is gone before her procedure.  Spoke to Dr Marin Olp. He would like patient to be prescribed diflucan 200mg  daily until he can see her again.   Patient aware of new prescription. Pharmacy confirmed.

## 2018-04-02 ENCOUNTER — Ambulatory Visit (HOSPITAL_COMMUNITY): Payer: No Typology Code available for payment source

## 2018-04-07 ENCOUNTER — Inpatient Hospital Stay: Payer: Medicaid Other | Attending: Hematology & Oncology | Admitting: Hematology & Oncology

## 2018-04-07 ENCOUNTER — Inpatient Hospital Stay: Payer: Medicaid Other

## 2018-04-07 ENCOUNTER — Other Ambulatory Visit: Payer: Self-pay | Admitting: *Deleted

## 2018-04-07 ENCOUNTER — Telehealth: Payer: Self-pay | Admitting: Hematology & Oncology

## 2018-04-07 ENCOUNTER — Telehealth: Payer: Self-pay | Admitting: *Deleted

## 2018-04-07 VITALS — BP 104/42 | HR 85 | Temp 98.9°F | Resp 17 | Wt 123.5 lb

## 2018-04-07 DIAGNOSIS — D649 Anemia, unspecified: Secondary | ICD-10-CM | POA: Diagnosis not present

## 2018-04-07 DIAGNOSIS — D638 Anemia in other chronic diseases classified elsewhere: Secondary | ICD-10-CM

## 2018-04-07 DIAGNOSIS — D61818 Other pancytopenia: Secondary | ICD-10-CM | POA: Diagnosis not present

## 2018-04-07 DIAGNOSIS — Z23 Encounter for immunization: Secondary | ICD-10-CM | POA: Diagnosis not present

## 2018-04-07 DIAGNOSIS — R161 Splenomegaly, not elsewhere classified: Secondary | ICD-10-CM | POA: Diagnosis not present

## 2018-04-07 LAB — CMP (CANCER CENTER ONLY)
ALT: 23 U/L (ref 10–47)
ANION GAP: 11 (ref 5–15)
AST: 66 U/L — AB (ref 11–38)
Albumin: 2.7 g/dL — ABNORMAL LOW (ref 3.5–5.0)
Alkaline Phosphatase: 154 U/L — ABNORMAL HIGH (ref 26–84)
BILIRUBIN TOTAL: 4.1 mg/dL — AB (ref 0.2–1.6)
BUN: 6 mg/dL — ABNORMAL LOW (ref 7–22)
CALCIUM: 8.9 mg/dL (ref 8.0–10.3)
CO2: 26 mmol/L (ref 18–33)
Chloride: 100 mmol/L (ref 98–108)
Creatinine: 0.9 mg/dL (ref 0.60–1.20)
GLUCOSE: 122 mg/dL — AB (ref 73–118)
POTASSIUM: 3.5 mmol/L (ref 3.3–4.7)
Sodium: 137 mmol/L (ref 128–145)
TOTAL PROTEIN: 8 g/dL (ref 6.4–8.1)

## 2018-04-07 LAB — DIRECT ANTIGLOBULIN TEST (NOT AT ARMC)
DAT, IgG: NEGATIVE
DAT, complement: NEGATIVE

## 2018-04-07 LAB — CBC WITH DIFFERENTIAL (CANCER CENTER ONLY)
BASOS ABS: 0 10*3/uL (ref 0.0–0.1)
Basophils Relative: 1 %
EOS PCT: 3 %
Eosinophils Absolute: 0.1 10*3/uL (ref 0.0–0.5)
HEMATOCRIT: 24.4 % — AB (ref 34.8–46.6)
Hemoglobin: 8.2 g/dL — ABNORMAL LOW (ref 11.6–15.9)
LYMPHS ABS: 0.5 10*3/uL — AB (ref 0.9–3.3)
LYMPHS PCT: 19 %
MCH: 36 pg — AB (ref 26.0–34.0)
MCHC: 33.6 g/dL (ref 32.0–36.0)
MCV: 107 fL — AB (ref 81.0–101.0)
MONO ABS: 0.2 10*3/uL (ref 0.1–0.9)
Monocytes Relative: 6 %
NEUTROS ABS: 2 10*3/uL (ref 1.5–6.5)
Neutrophils Relative %: 71 %
PLATELETS: 64 10*3/uL — AB (ref 145–400)
RBC: 2.28 MIL/uL — AB (ref 3.70–5.32)
RDW: 18.2 % — ABNORMAL HIGH (ref 11.1–15.7)
WBC: 2.8 10*3/uL — AB (ref 3.9–10.0)

## 2018-04-07 LAB — SAMPLE TO BLOOD BANK

## 2018-04-07 LAB — PREPARE RBC (CROSSMATCH)

## 2018-04-07 LAB — FERRITIN: Ferritin: 2016 ng/mL — ABNORMAL HIGH (ref 9–269)

## 2018-04-07 LAB — LACTATE DEHYDROGENASE: LDH: 198 U/L (ref 125–245)

## 2018-04-07 MED ORDER — PNEUMOCOCCAL 13-VAL CONJ VACC IM SUSP: 0.5000 mL | Freq: Once | INTRAMUSCULAR | Status: DC

## 2018-04-07 MED ORDER — MENINGOCOCCAL A C Y&W-135 OLIG IM SOLR: 0.5000 mL | Freq: Once | INTRAMUSCULAR | Status: DC

## 2018-04-07 NOTE — Telephone Encounter (Signed)
No LOS for 6/19 visit

## 2018-04-07 NOTE — Telephone Encounter (Signed)
Critical Value Total Bilirubin 4.1 Dr Marin Olp notified. No orders at this time.

## 2018-04-09 ENCOUNTER — Inpatient Hospital Stay: Payer: Medicaid Other

## 2018-04-09 ENCOUNTER — Other Ambulatory Visit: Payer: Self-pay | Admitting: Family

## 2018-04-09 DIAGNOSIS — D649 Anemia, unspecified: Secondary | ICD-10-CM

## 2018-04-09 DIAGNOSIS — D61818 Other pancytopenia: Secondary | ICD-10-CM

## 2018-04-09 DIAGNOSIS — D638 Anemia in other chronic diseases classified elsewhere: Secondary | ICD-10-CM

## 2018-04-09 MED ORDER — ACETAMINOPHEN 325 MG PO TABS: 650.0000 mg | ORAL_TABLET | Freq: Once | ORAL | Status: DC

## 2018-04-09 MED ORDER — SODIUM CHLORIDE 0.9 % IV SOLN: 250.0000 mL | Freq: Once | INTRAVENOUS | Status: DC

## 2018-04-09 MED ORDER — DIPHENHYDRAMINE HCL 25 MG PO CAPS
ORAL_CAPSULE | ORAL | Status: AC
  Filled 2018-04-09: qty 1

## 2018-04-09 MED ORDER — MENINGOCOCCAL A C Y&W-135 OLIG IM SOLR
0.5000 mL | Freq: Once | INTRAMUSCULAR | Status: AC
  Administered 2018-04-09: 0.5 mL via INTRAMUSCULAR
  Filled 2018-04-09: qty 0.5

## 2018-04-09 MED ORDER — PNEUMOCOCCAL 13-VAL CONJ VACC IM SUSP
0.5000 mL | Freq: Once | INTRAMUSCULAR | Status: AC
  Administered 2018-04-09: 0.5 mL via INTRAMUSCULAR
  Filled 2018-04-09: qty 0.5

## 2018-04-09 MED ORDER — SODIUM CHLORIDE 0.9 % IV SOLN
250.0000 mL | Freq: Once | INTRAVENOUS | Status: AC
  Administered 2018-04-09: 250 mL via INTRAVENOUS

## 2018-04-09 MED ORDER — FUROSEMIDE 10 MG/ML IJ SOLN: 20.0000 mg | Freq: Once | INTRAMUSCULAR | Status: DC

## 2018-04-09 MED ORDER — DIPHENHYDRAMINE HCL 25 MG PO CAPS
25.0000 mg | ORAL_CAPSULE | Freq: Once | ORAL | Status: AC
  Administered 2018-04-09: 25 mg via ORAL

## 2018-04-09 NOTE — Patient Instructions (Signed)
Thrombocytopenia Thrombocytopenia means that you have a low number of platelets in your blood. Platelets are tiny cells in the blood. When you bleed, they clump together at the cut or injury to stop the bleeding. This is called blood clotting. Not having enough platelets can cause bleeding problems. Follow these instructions at home: General instructions  Check your skin and inside your mouth for bruises or blood as told by your doctor.  Check to see if there is blood in your spit (sputum), pee (urine), and poop (stool). Do this as told by your doctor.  Ask your doctor if you can drink alcohol.  Take over-the-counter and prescription medicines only as told by your doctor.  Tell all of your doctors that you have this condition. Be sure to tell your dentist and eye doctor too. Activity  Do not do activities that can cause bumps or bruises until your doctor says it is okay.  Be careful not to cut yourself: ? When you shave. ? When you use scissors, needles, knives, or other tools.  Be careful not to burn yourself: ? When you use an iron. ? When you cook. Contact a doctor if:  You have bruises and you do not know why. Get help right away if:  You are bleeding anywhere on your body.  You have blood in your spit, pee, or poop. This information is not intended to replace advice given to you by your health care provider. Make sure you discuss any questions you have with your health care provider. Document Released: 09/25/2011 Document Revised: 06/08/2016 Document Reviewed: 04/09/2015 Elsevier Interactive Patient Education  2018 [place]. Blood Transfusion, Adult A blood transfusion is a procedure in which you receive donated blood, including plasma, platelets, and red blood cells, through an IV tube. You may need a blood transfusion because of illness, surgery, or injury. The blood may come from a donor. You may also be able to donate blood for yourself (autologous blood donation)  before a surgery if you know that you might require a blood transfusion. The blood given in a transfusion is made up of different types of cells. You may receive:  Red blood cells. These carry oxygen to the cells in the body.  White blood cells. These help you fight infections.  Platelets. These help your blood to clot.  Plasma. This is the liquid part of your blood and it helps with fluid imbalances.  If you have hemophilia or another clotting disorder, you may also receive other types of blood products. Tell a health care provider about:  Any allergies you have.  All medicines you are taking, including vitamins, herbs, eye drops, creams, and over-the-counter medicines.  Any problems you or family members have had with anesthetic medicines.  Any blood disorders you have.  Any surgeries you have had.  Any medical conditions you have, including any recent fever or cold symptoms.  Whether you are pregnant or may be pregnant.  Any previous reactions you have had during a blood transfusion. What are the risks? Generally, this is a safe procedure. However, problems may occur, including:  Having an allergic reaction to something in the donated blood. Hives and itching may be symptoms of this type of reaction.  Fever. This may be a reaction to the white blood cells in the transfused blood. Nausea or chest pain may accompany a fever.  Iron overload. This can happen from having many transfusions.  Transfusion-related acute lung injury (TRALI). This is a rare reaction that causes lung  damage. The cause is not known.TRALI can occur within hours of a transfusion or several days later.  Sudden (acute) or delayed hemolytic reactions. This happens if your blood does not match the cells in your transfusion. Your body's defense system (immune system) may try to attack the new cells. This complication is rare. The symptoms include fever, chills, nausea, and low back pain or chest  pain.  Infection or disease transmission. This is rare.  What happens before the procedure?  You will have a blood test to determine your blood type. This is necessary to know what kind of blood your body will accept and to match it to the donor blood.  If you are going to have a planned surgery, you may be able to do an autologous blood donation. This may be done in case you need to have a transfusion.  If you have had an allergic reaction to a transfusion in the past, you may be given medicine to help prevent a reaction. This medicine may be given to you by mouth or through an IV tube.  You will have your temperature, blood pressure, and pulse monitored before the transfusion.  Follow instructions from your health care provider about eating and drinking restrictions.  Ask your health care provider about: ? Changing or stopping your regular medicines. This is especially important if you are taking diabetes medicines or blood thinners. ? Taking medicines such as aspirin and ibuprofen. These medicines can thin your blood. Do not take these medicines before your procedure if your health care provider instructs you not to. What happens during the procedure?  An IV tube will be inserted into one of your veins.  The bag of donated blood will be attached to your IV tube. The blood will then enter through your vein.  Your temperature, blood pressure, and pulse will be monitored regularly during the transfusion. This monitoring is done to detect early signs of a transfusion reaction.  If you have any signs or symptoms of a reaction, your transfusion will be stopped and you may be given medicine.  When the transfusion is complete, your IV tube will be removed.  Pressure may be applied to the IV site for a few minutes.  A bandage (dressing) will be applied. The procedure may vary among health care providers and hospitals. What happens after the procedure?  Your temperature, blood pressure,  heart rate, breathing rate, and blood oxygen level will be monitored often.  Your blood may be tested to see how you are responding to the transfusion.  You may be warmed with fluids or blankets to maintain a normal body temperature. Summary  A blood transfusion is a procedure in which you receive donated blood, including plasma, platelets, and red blood cells, through an IV tube.  Your temperature, blood pressure, and pulse will be monitored before, during, and after the transfusion.  Your blood may be tested after the transfusion to see how your body has responded. This information is not intended to replace advice given to you by your health care provider. Make sure you discuss any questions you have with your health care provider. Document Released: 10/03/2000 Document Revised: 07/03/2016 Document Reviewed: 07/03/2016 Elsevier Interactive Patient Education  [employer].

## 2018-04-12 LAB — TYPE AND SCREEN
ABO/RH(D): O NEG
Antibody Screen: POSITIVE
DONOR AG TYPE: NEGATIVE
Donor AG Type: NEGATIVE
Donor AG Type: NEGATIVE
UNIT DIVISION: 0
Unit division: 0
Unit division: 0

## 2018-04-12 LAB — BPAM RBC
BLOOD PRODUCT EXPIRATION DATE: 201906292359
Blood Product Expiration Date: 201906212359
Blood Product Expiration Date: 201907142359
ISSUE DATE / TIME: 201906201421
ISSUE DATE / TIME: 201906210804
ISSUE DATE / TIME: 201906210804
UNIT TYPE AND RH: 9500
UNIT TYPE AND RH: 9500
Unit Type and Rh: 9500

## 2018-04-12 LAB — BPAM PLATELET PHERESIS
BLOOD PRODUCT EXPIRATION DATE: 201906212359
ISSUE DATE / TIME: 201906210807
Unit Type and Rh: 6200

## 2018-04-12 LAB — PREPARE PLATELET PHERESIS: UNIT DIVISION: 0

## 2018-04-14 ENCOUNTER — Ambulatory Visit: Payer: Self-pay | Admitting: Neurology

## 2018-04-15 NOTE — Progress Notes (Signed)
Hematology and Oncology Follow Up Visit  Jeroline Wolbert [id number] 1971/02/23 47 y.o. 04/15/2018   Principle Diagnosis:   Anemia - multi-factorial  Cirrhosis w/ splenomegaly  Current Therapy:    Aranesp 300 mcg sq q 3 wks for Hgb < 11  IV Iron as needed  Folic acid 1 mg po q day     Interim History:  Ms. Ozdemir is in for her follow-up.  She comes in with her husband.  He is very nice.  This is really a complicated situation.  She still is quite anemic.  She has pan cytopenia.  Her white cell count is 2.8.  Hemoglobin 8.2 and platelet count 64,000.  Our work-up so far has been unremarkable outside the fact that she probably has an element of hemolysis.  She does have splenomegaly.  I just wonder if she does not have some splenic etiology for her hemolysis.  Her bilirubin is 4.1.  Most of this is indirect.  As such, I have to suspect that there is some type of hemolysis.  We have checked her for PNH.  This test was negative.  Her erythropoietin level is 43.  I am really debating as to whether or not we need to consider getting her spleen out.  I know she has underlying cirrhosis.  However, her liver function tests are pretty good.  Her albumin is 2.7.  Her prothrombin time is 18.8 with an INR of 1.6.  I have checked her for cryoglobulinemia.  This also was unremarkable.  I think another possibility might be some type of porphyria.  I Maji consider a 24-hour urine on her for this.  Her last splenic study showed that her splenic volume actually decreased a little bit.  It was 1325 cm.  She is eating okay.  She has had no fever.  She has had no rashes.  Overall, her performance status is ECOG 1.  Medications:  Current Outpatient Medications:  .  Cholecalciferol (VITAMIN D PO), Take 1 capsule by mouth daily., Disp: , Rfl:  .  colestipol (COLESTID) 1 g tablet, Take 1 tablet (1 g total) by mouth 2 (two) times daily., Disp: 30 tablet, Rfl: 0 .  dicyclomine (BENTYL)  10 MG capsule, Take 1 capsule (10 mg total) by mouth 3 (three) times daily before meals. (Patient taking differently: Take 10 mg by mouth 3 (three) times daily. ), Disp: 90 capsule, Rfl: 3 .  diphenhydrAMINE (BENADRYL) 25 MG tablet, Take 25 mg by mouth daily., Disp: , Rfl:  .  esomeprazole (NEXIUM) 20 MG capsule, Take 20 mg by mouth daily at 12 noon., Disp: , Rfl:  .  fluconazole (DIFLUCAN) 200 MG tablet, Take 1 tablet (200 mg total) by mouth daily., Disp: 30 tablet, Rfl: 3 .  folic acid (FOLVITE) 1 MG tablet, Take 1 tablet (1 mg total) by mouth daily., Disp: 30 tablet, Rfl: 0 .  hydrOXYzine (ATARAX/VISTARIL) 10 MG tablet, Take 1 tablet (10 mg total) by mouth 2 (two) times daily., Disp: 60 tablet, Rfl: 3 .  levothyroxine (SYNTHROID, LEVOTHROID) 75 MCG tablet, Take 1 tablet (75 mcg total) by mouth daily before breakfast., Disp: 30 tablet, Rfl: 2 .  lipase/protease/amylase (CREON) 36000 UNITS CPEP capsule, Take 1 capsule (36,000 Units total) by mouth 3 (three) times daily before meals., Disp: 90 capsule, Rfl: 0 .  loperamide (IMODIUM A-D) 2 MG tablet, Take 1 tablet (2 mg total) by mouth 4 (four) times daily as needed for diarrhea or loose stools., Disp: 30 tablet, Rfl:  0 .  Melatonin 10 MG TABS, Take 10 mg by mouth at bedtime as needed (for sleep)., Disp: , Rfl:  .  Multiple Vitamin (MULTIVITAMIN WITH MINERALS) TABS tablet, Take 1 tablet by mouth daily., Disp: , Rfl:  .  nicotine (NICODERM CQ - DOSED IN MG/24 HOURS) 21 mg/24hr patch, Place 1 patch (21 mg total) onto the skin daily., Disp: 28 patch, Rfl: 0 .  ondansetron (ZOFRAN) 4 MG tablet, Take 1 tablet (4 mg total) by mouth every 6 (six) hours as needed for nausea., Disp: 20 tablet, Rfl: 0 .  potassium chloride (KLOR-CON) 8 MEQ tablet, Take 2 tablets (16 mEq total) by mouth daily., Disp: 60 tablet, Rfl: 3 .  pregabalin (LYRICA) 50 MG capsule, Take 1 capsule (50 mg total) by mouth 3 (three) times daily., Disp: 540 capsule, Rfl: 1 .  sertraline  (ZOLOFT) 50 MG tablet, TAKE 1/2 TABLET BY MOUTH DAILY FOR 2 WEEKS, THEN 1 TABLET DAILY., Disp: 30 tablet, Rfl: 0 .  [START ON 04/18/2018] traMADol (ULTRAM) 50 MG tablet, Take 1 tablet (50 mg total) by mouth 2 (two) times daily as needed., Disp: 60 tablet, Rfl: 0 .  traMADol (ULTRAM) 50 MG tablet, Take 1 tablet (50 mg total) by mouth 2 (two) times daily as needed., Disp: 60 tablet, Rfl: 0  Allergies:  Allergies  Allergen Reactions  . Iohexol Hives, Itching and Swelling  . Lactose Intolerance (Gi) Diarrhea and Nausea Only  . Other Other (See Comments)    Spicy foods "tear up my stomach"  . Vancomycin Itching    Nose itching  . Morphine And Related Itching and Other (See Comments)    Can tolerate with Benadryl    Past Medical History, Surgical history, Social history, and Family History were reviewed and updated.  Review of Systems: Review of Systems  Constitutional: Positive for fatigue.  HENT:   Positive for sore throat and trouble swallowing.   Eyes: Negative.   Respiratory: Positive for shortness of breath.   Cardiovascular: Positive for palpitations.  Gastrointestinal: Positive for abdominal distention, abdominal pain, diarrhea and nausea.  Endocrine: Negative.   Genitourinary: Positive for frequency.   Musculoskeletal: Positive for arthralgias, gait problem and myalgias.  Skin: Negative.   Neurological: Positive for dizziness and gait problem.  Hematological: Negative.   Psychiatric/Behavioral: Negative.     Physical Exam:  weight is 123 lb 8 oz (56 kg). Her oral temperature is 98.9 F (37.2 C). Her blood pressure is 104/42 (abnormal) and her pulse is 85. Her respiration is 17 and oxygen saturation is 98%.   Wt Readings from Last 3 Encounters:  04/07/18 123 lb 8 oz (56 kg)  03/19/18 130 lb 6.4 oz (59.1 kg)  03/17/18 127 lb 12 oz (57.9 kg)    Physical Exam  Constitutional: She is oriented to person, place, and time.  HENT:  Head: Normocephalic and atraumatic.    Mouth/Throat: Oropharynx is clear and moist.  Eyes: Pupils are equal, round, and reactive to light. EOM are normal.  Neck: Normal range of motion.  Cardiovascular: Normal rate, regular rhythm and normal heart sounds.  Pulmonary/Chest: Effort normal and breath sounds normal.  Abdominal: Soft. Bowel sounds are normal.  Musculoskeletal: Normal range of motion. She exhibits no edema, tenderness or deformity.  Lymphadenopathy:    She has no cervical adenopathy.  Neurological: She is alert and oriented to person, place, and time.  Skin: Skin is warm and dry. No rash noted. No erythema.  Psychiatric: She has a normal mood and  affect. Her behavior is normal. Judgment and thought content normal.  Vitals reviewed.    Lab Results  Component Value Date   WBC 2.8 (L) 04/07/2018   HGB 8.2 (L) 04/07/2018   HCT 24.4 (L) 04/07/2018   MCV 107.0 (H) 04/07/2018   PLT 64 (L) 04/07/2018     Chemistry      Component Value Date/Time   NA 137 04/07/2018 0801   NA 136 08/13/2017 1521   K 3.5 04/07/2018 0801   CL 100 04/07/2018 0801   CO2 26 04/07/2018 0801   BUN 6 (L) 04/07/2018 0801   BUN 5 (L) 08/13/2017 1521   CREATININE 0.90 04/07/2018 0801   CREATININE 1.01 05/19/2016 1639      Component Value Date/Time   CALCIUM 8.9 04/07/2018 0801   ALKPHOS 154 (H) 04/07/2018 0801   AST 66 (H) 04/07/2018 0801   ALT 23 04/07/2018 0801   BILITOT 4.1 (HH) 04/07/2018 0801       Impression and Plan: Ms. Rossbach is a 47 year old white female.  She has mild pancytopenia.    We will have to transfuse her.  I will go ahead and give her a couple units of blood.  She will also get the triple vaccine as I am thinking about a splenectomy on her.  I am debating this quite a bit.  This is a incredibly difficult situation.  I just want to make the right decision for her.  This is incredibly complex.  I typically spent 40-45 minutes with her as there are a lot of issues that need to be sorted out.  I would  like to see her back in another couple weeks.   Volanda Napoleon, MD 6/27/20196:17 PM

## 2018-04-20 ENCOUNTER — Encounter: Payer: Self-pay | Admitting: Hematology & Oncology

## 2018-04-20 ENCOUNTER — Other Ambulatory Visit: Payer: Self-pay | Admitting: Internal Medicine

## 2018-04-20 DIAGNOSIS — F329 Major depressive disorder, single episode, unspecified: Secondary | ICD-10-CM

## 2018-04-20 DIAGNOSIS — F419 Anxiety disorder, unspecified: Principal | ICD-10-CM

## 2018-04-20 MED FILL — traMADol HCL 50 MG TABS: 50 | 7 days supply | Qty: 14 | Fill #0

## 2018-04-21 ENCOUNTER — Other Ambulatory Visit: Payer: Self-pay

## 2018-04-21 MED ORDER — FOLIC ACID 1 MG PO TABS: 1.0000 mg | ORAL_TABLET | Freq: Every day | ORAL | 0 refills | Status: DC

## 2018-04-21 MED FILL — SERTRALINE HCL 50 MG TABS: 50 | 30 days supply | Qty: 30 | Fill #0

## 2018-04-26 ENCOUNTER — Ambulatory Visit: Payer: Self-pay | Admitting: Neurology

## 2018-04-30 MED FILL — traMADol HCL 50 MG TABS: 50 | 7 days supply | Qty: 14 | Fill #1

## 2018-05-05 MED FILL — traMADol HCL 50 MG TABS: 50 | 7 days supply | Qty: 14 | Fill #2

## 2018-05-14 MED FILL — traMADol HCL 50 MG TABS: 50 | 7 days supply | Qty: 14 | Fill #3

## 2018-05-21 ENCOUNTER — Telehealth: Payer: Self-pay | Admitting: Internal Medicine

## 2018-05-21 DIAGNOSIS — G8929 Other chronic pain: Secondary | ICD-10-CM

## 2018-05-21 DIAGNOSIS — R109 Unspecified abdominal pain: Principal | ICD-10-CM

## 2018-05-21 MED FILL — SERTRALINE HCL 50 MG TABS: 50 | 30 days supply | Qty: 30 | Fill #1

## 2018-05-21 MED FILL — traMADol HCL 50 MG TABS: 50 | 2 days supply | Qty: 4 | Fill #4

## 2018-05-21 NOTE — Telephone Encounter (Signed)
Pts friend Jenene Slicker came in to request a change in pharmacy Please send a medication refill on -traMADol (ULTRAM) 50 MG tablet  to [facility] on [address]

## 2018-05-21 NOTE — Telephone Encounter (Signed)
Will forward to pcp

## 2018-05-24 MED ORDER — TRAMADOL HCL 50 MG PO TABS: 50.0000 mg | ORAL_TABLET | Freq: Two times a day (BID) | ORAL | 0 refills | Status: DC | PRN

## 2018-05-24 NOTE — Telephone Encounter (Signed)
Patient requesting refill on tramadol.  [place] controlled substance reporting system reviewed and is appropriate.  Refill given on tramadol 50 mg 1 tablet twice a day as needed #60.  2 prescriptions given

## 2018-05-25 ENCOUNTER — Ambulatory Visit: Payer: Self-pay | Admitting: Gastroenterology

## 2018-05-25 ENCOUNTER — Other Ambulatory Visit: Payer: Self-pay | Admitting: Physician Assistant

## 2018-05-25 DIAGNOSIS — R110 Nausea: Secondary | ICD-10-CM

## 2018-05-25 NOTE — Telephone Encounter (Signed)
Contacted pt and made aware  

## 2018-05-28 ENCOUNTER — Inpatient Hospital Stay: Payer: Medicaid Other | Attending: Hematology & Oncology | Admitting: Hematology & Oncology

## 2018-05-28 ENCOUNTER — Other Ambulatory Visit: Payer: Self-pay

## 2018-05-28 ENCOUNTER — Inpatient Hospital Stay: Payer: Medicaid Other

## 2018-05-28 ENCOUNTER — Encounter: Payer: Self-pay | Admitting: Hematology & Oncology

## 2018-05-28 VITALS — BP 110/37 | HR 77 | Temp 98.3°F | Resp 18 | Wt 121.0 lb

## 2018-05-28 DIAGNOSIS — R161 Splenomegaly, not elsewhere classified: Secondary | ICD-10-CM | POA: Diagnosis not present

## 2018-05-28 DIAGNOSIS — N189 Chronic kidney disease, unspecified: Secondary | ICD-10-CM | POA: Diagnosis not present

## 2018-05-28 DIAGNOSIS — D638 Anemia in other chronic diseases classified elsewhere: Secondary | ICD-10-CM

## 2018-05-28 DIAGNOSIS — D61818 Other pancytopenia: Secondary | ICD-10-CM

## 2018-05-28 DIAGNOSIS — D631 Anemia in chronic kidney disease: Secondary | ICD-10-CM | POA: Diagnosis not present

## 2018-05-28 DIAGNOSIS — Z720 Tobacco use: Secondary | ICD-10-CM | POA: Diagnosis not present

## 2018-05-28 DIAGNOSIS — K766 Portal hypertension: Secondary | ICD-10-CM | POA: Insufficient documentation

## 2018-05-28 DIAGNOSIS — D649 Anemia, unspecified: Secondary | ICD-10-CM

## 2018-05-28 DIAGNOSIS — K7460 Unspecified cirrhosis of liver: Secondary | ICD-10-CM | POA: Insufficient documentation

## 2018-05-28 LAB — CMP (CANCER CENTER ONLY)
ALK PHOS: 190 U/L — AB (ref 26–84)
ALT: 30 U/L (ref 10–47)
AST: 80 U/L — ABNORMAL HIGH (ref 11–38)
Albumin: 3 g/dL — ABNORMAL LOW (ref 3.5–5.0)
Anion gap: 8 (ref 5–15)
BUN: 9 mg/dL (ref 7–22)
CO2: 28 mmol/L (ref 18–33)
Calcium: 9.4 mg/dL (ref 8.0–10.3)
Chloride: 100 mmol/L (ref 98–108)
Creatinine: 0.9 mg/dL (ref 0.60–1.20)
Glucose, Bld: 99 mg/dL (ref 73–118)
POTASSIUM: 4.2 mmol/L (ref 3.3–4.7)
SODIUM: 136 mmol/L (ref 128–145)
TOTAL PROTEIN: 8 g/dL (ref 6.4–8.1)
Total Bilirubin: 3.1 mg/dL — ABNORMAL HIGH (ref 0.2–1.6)

## 2018-05-28 LAB — CBC WITH DIFFERENTIAL (CANCER CENTER ONLY)
Basophils Absolute: 0 10*3/uL (ref 0.0–0.1)
Basophils Relative: 0 %
EOS ABS: 0.1 10*3/uL (ref 0.0–0.5)
Eosinophils Relative: 1 %
HEMATOCRIT: 25 % — AB (ref 34.8–46.6)
HEMOGLOBIN: 8.1 g/dL — AB (ref 11.6–15.9)
LYMPHS PCT: 19 %
Lymphs Abs: 0.7 10*3/uL — ABNORMAL LOW (ref 0.9–3.3)
MCH: 35.7 pg — AB (ref 26.0–34.0)
MCHC: 32.4 g/dL (ref 32.0–36.0)
MCV: 110.1 fL — AB (ref 81.0–101.0)
Monocytes Absolute: 0.3 10*3/uL (ref 0.1–0.9)
Monocytes Relative: 8 %
NEUTROS ABS: 2.6 10*3/uL (ref 1.5–6.5)
NEUTROS PCT: 72 %
Platelet Count: 63 10*3/uL — ABNORMAL LOW (ref 145–400)
RBC: 2.27 MIL/uL — AB (ref 3.70–5.32)
RDW: 15.7 % (ref 11.1–15.7)
WBC: 3.6 10*3/uL — AB (ref 3.9–10.0)

## 2018-05-28 LAB — LACTATE DEHYDROGENASE: LDH: 199 U/L — AB (ref 98–192)

## 2018-05-28 LAB — RETICULOCYTES
RBC.: 2.29 MIL/uL — ABNORMAL LOW (ref 3.70–5.45)
Retic Count, Absolute: 144.3 10*3/uL — ABNORMAL HIGH (ref 33.7–90.7)
Retic Ct Pct: 6.3 % — ABNORMAL HIGH (ref 0.7–2.1)

## 2018-05-28 LAB — BILIRUBIN, DIRECT: Bilirubin, Direct: 0.9 mg/dL — ABNORMAL HIGH (ref 0.0–0.2)

## 2018-05-28 NOTE — Progress Notes (Signed)
Hematology and Oncology Follow Up Visit  Jamie Allen [id number] 01-01-1971 47 y.o. 05/28/2018   Principle Diagnosis:   Anemia - multi-factorial  Cirrhosis w/ splenomegaly  Current Therapy:    Aranesp 300 mcg sq q 3 wks for Hgb < 11  IV Iron as needed  Folic acid 1 mg po q day     Interim History:  Jamie Allen is in for her follow-up.  She comes in with her husband.  The issue now is that she is post to have all of her teeth taken out.  This is going to require extensive oral surgery.  She is planning on having this done on August 30.  I think will be very interesting to see if this can help her blood counts.  So far, her work-up has been totally unremarkable.  We have tested her for as much as I can think of.  She does have a low erythropoietin level.  He is only 80.  As such, we might be able to consider Aranesp for her.  She does feel tired.  She is had some epistaxis.  She is still smoking.  She is not drinking.  She is had no fever.  She had no rashes.  She has had no leg swelling.  Overall, her performance status is ECOG 1.  Medications:  Current Outpatient Medications:  .  Cholecalciferol (VITAMIN D PO), Take 1 capsule by mouth daily., Disp: , Rfl:  .  colestipol (COLESTID) 1 g tablet, Take 1 tablet (1 g total) by mouth 2 (two) times daily., Disp: 30 tablet, Rfl: 0 .  dicyclomine (BENTYL) 10 MG capsule, Take 1 capsule (10 mg total) by mouth 3 (three) times daily before meals. (Patient taking differently: Take 10 mg by mouth 3 (three) times daily. ), Disp: 90 capsule, Rfl: 3 .  diphenhydrAMINE (BENADRYL) 25 MG tablet, Take 25 mg by mouth daily., Disp: , Rfl:  .  esomeprazole (NEXIUM) 20 MG capsule, Take 20 mg by mouth daily at 12 noon., Disp: , Rfl:  .  fluconazole (DIFLUCAN) 200 MG tablet, Take 1 tablet (200 mg total) by mouth daily., Disp: 30 tablet, Rfl: 3 .  folic acid (FOLVITE) 1 MG tablet, Take 1 tablet (1 mg total) by mouth daily., Disp: 30 tablet,  Rfl: 0 .  hydrOXYzine (ATARAX/VISTARIL) 10 MG tablet, Take 1 tablet (10 mg total) by mouth 2 (two) times daily., Disp: 60 tablet, Rfl: 3 .  levothyroxine (SYNTHROID, LEVOTHROID) 75 MCG tablet, Take 1 tablet (75 mcg total) by mouth daily before breakfast., Disp: 30 tablet, Rfl: 2 .  lipase/protease/amylase (CREON) 36000 UNITS CPEP capsule, Take 1 capsule (36,000 Units total) by mouth 3 (three) times daily before meals., Disp: 90 capsule, Rfl: 0 .  loperamide (IMODIUM A-D) 2 MG tablet, Take 1 tablet (2 mg total) by mouth 4 (four) times daily as needed for diarrhea or loose stools., Disp: 30 tablet, Rfl: 0 .  Melatonin 10 MG TABS, Take 10 mg by mouth at bedtime as needed (for sleep)., Disp: , Rfl:  .  Multiple Vitamin (MULTIVITAMIN WITH MINERALS) TABS tablet, Take 1 tablet by mouth daily., Disp: , Rfl:  .  nicotine (NICODERM CQ - DOSED IN MG/24 HOURS) 21 mg/24hr patch, Place 1 patch (21 mg total) onto the skin daily., Disp: 28 patch, Rfl: 0 .  ondansetron (ZOFRAN) 4 MG tablet, Take 1 tablet (4 mg total) by mouth every 6 (six) hours as needed for nausea., Disp: 20 tablet, Rfl: 0 .  potassium chloride (  KLOR-CON) 8 MEQ tablet, Take 2 tablets (16 mEq total) by mouth daily., Disp: 60 tablet, Rfl: 3 .  pregabalin (LYRICA) 50 MG capsule, Take 1 capsule (50 mg total) by mouth 3 (three) times daily., Disp: 540 capsule, Rfl: 1 .  sertraline (ZOLOFT) 50 MG tablet, Take 1 tablet (50 mg total) by mouth daily., Disp: 30 tablet, Rfl: 2 .  traMADol (ULTRAM) 50 MG tablet, Take 1 tablet (50 mg total) by mouth 2 (two) times daily as needed. To fill on or after 06/24/2018, Disp: 60 tablet, Rfl: 0 .  traMADol (ULTRAM) 50 MG tablet, Take 1 tablet (50 mg total) by mouth 2 (two) times daily as needed., Disp: 60 tablet, Rfl: 0  Allergies:  Allergies  Allergen Reactions  . Iohexol Hives, Itching and Swelling  . Lactose Intolerance (Gi) Diarrhea and Nausea Only  . Other Other (See Comments)    Spicy foods "tear up my stomach"    . Vancomycin Itching    Nose itching  . Morphine And Related Itching and Other (See Comments)    Can tolerate with Benadryl    Past Medical History, Surgical history, Social history, and Family History were reviewed and updated.  Review of Systems: Review of Systems  Constitutional: Positive for fatigue.  HENT:   Positive for sore throat and trouble swallowing.   Eyes: Negative.   Respiratory: Positive for shortness of breath.   Cardiovascular: Positive for palpitations.  Gastrointestinal: Positive for abdominal distention, abdominal pain, diarrhea and nausea.  Endocrine: Negative.   Genitourinary: Positive for frequency.   Musculoskeletal: Positive for arthralgias, gait problem and myalgias.  Skin: Negative.   Neurological: Positive for dizziness and gait problem.  Hematological: Negative.   Psychiatric/Behavioral: Negative.     Physical Exam:  weight is 121 lb (54.9 kg). Her oral temperature is 98.3 F (36.8 C). Her blood pressure is 110/37 (abnormal) and her pulse is 77. Her respiration is 18 and oxygen saturation is 100%.   Wt Readings from Last 3 Encounters:  05/28/18 121 lb (54.9 kg)  04/07/18 123 lb 8 oz (56 kg)  03/19/18 130 lb 6.4 oz (59.1 kg)    Physical Exam  Constitutional: She is oriented to person, place, and time.  HENT:  Head: Normocephalic and atraumatic.  Mouth/Throat: Oropharynx is clear and moist.  Eyes: Pupils are equal, round, and reactive to light. EOM are normal.  Neck: Normal range of motion.  Cardiovascular: Normal rate, regular rhythm and normal heart sounds.  Pulmonary/Chest: Effort normal and breath sounds normal.  Abdominal: Soft. Bowel sounds are normal.  Musculoskeletal: Normal range of motion. She exhibits no edema, tenderness or deformity.  Lymphadenopathy:    She has no cervical adenopathy.  Neurological: She is alert and oriented to person, place, and time.  Skin: Skin is warm and dry. No rash noted. No erythema.  Psychiatric:  She has a normal mood and affect. Her behavior is normal. Judgment and thought content normal.  Vitals reviewed.    Lab Results  Component Value Date   WBC 3.6 (L) 05/28/2018   HGB 8.1 (L) 05/28/2018   HCT 25.0 (L) 05/28/2018   MCV 110.1 (H) 05/28/2018   PLT 63 (L) 05/28/2018     Chemistry      Component Value Date/Time   NA 136 05/28/2018 1147   NA 136 08/13/2017 1521   K 4.2 05/28/2018 1147   CL 100 05/28/2018 1147   CO2 28 05/28/2018 1147   BUN 9 05/28/2018 1147   BUN 5 (L)  08/13/2017 1521   CREATININE 0.90 05/28/2018 1147   CREATININE 1.01 05/19/2016 1639      Component Value Date/Time   CALCIUM 9.4 05/28/2018 1147   ALKPHOS 190 (H) 05/28/2018 1147   AST 80 (H) 05/28/2018 1147   ALT 30 05/28/2018 1147   BILITOT 3.1 (H) 05/28/2018 1147       Impression and Plan: Ms. Pangborn is a 47 year old white female.  She has mild pancytopenia.    It is still hard to figure out what is going on.  I suppose that the splenomegaly could be an issue.  Her last ultrasound was done I think back in April or May.  It would be nice to do another one on her to see if there is any change.  We will go ahead and give her 1 unit of blood.  This will hopefully make her feel a little bit better.  We need to make sure that she is going to be safe for tooth extractions.  I will have to get her back a week during that she has her oral surgery.  I want to make sure that we check her blood and check her coagulation factors.  I will also make sure we check a von Willebrand factor.  Ms. Rolland is very complicated.  Again, is not clear as if anything else is going on.  She has a splenomegaly which I would think is from her cirrhosis.  I would hate to have to take her spleen out as I think this is somewhat helpful for her cirrhosis and portal hypertension.  I will plan to see her back 5 days before she has her oral surgery.Marland Kitchen   Volanda Napoleon, MD 8/9/20191:15 PM

## 2018-05-29 LAB — SAMPLE TO BLOOD BANK

## 2018-05-29 LAB — PREPARE RBC (CROSSMATCH)

## 2018-05-31 ENCOUNTER — Inpatient Hospital Stay: Payer: Medicaid Other

## 2018-05-31 DIAGNOSIS — D61818 Other pancytopenia: Secondary | ICD-10-CM | POA: Diagnosis not present

## 2018-05-31 DIAGNOSIS — D649 Anemia, unspecified: Secondary | ICD-10-CM

## 2018-05-31 LAB — COLD AGGLUTININ TITER

## 2018-05-31 LAB — FERRITIN: FERRITIN: 2130 ng/mL — AB (ref 11–307)

## 2018-05-31 LAB — IRON AND TIBC
IRON: 96 ug/dL (ref 41–142)
SATURATION RATIOS: 52 % (ref 21–57)
TIBC: 184 ug/dL — AB (ref 236–444)
UIBC: 88 ug/dL

## 2018-05-31 MED ORDER — FUROSEMIDE 10 MG/ML IJ SOLN: 20.0000 mg | Freq: Once | INTRAMUSCULAR | Status: DC

## 2018-05-31 MED ORDER — SODIUM CHLORIDE 0.9% IV SOLUTION
250.0000 mL | Freq: Once | INTRAVENOUS | Status: DC
  Filled 2018-05-31: qty 250

## 2018-05-31 NOTE — Patient Instructions (Signed)

## 2018-06-01 LAB — TYPE AND SCREEN
ABO/RH(D): O NEG
ANTIBODY SCREEN: POSITIVE
DONOR AG TYPE: NEGATIVE
Donor AG Type: NEGATIVE
UNIT DIVISION: 0
Unit division: 0

## 2018-06-01 LAB — BPAM RBC
Blood Product Expiration Date: 201908182359
Blood Product Expiration Date: 201908212359
ISSUE DATE / TIME: 201908120749
UNIT TYPE AND RH: 9500
Unit Type and Rh: 9500

## 2018-06-02 ENCOUNTER — Encounter: Payer: Self-pay | Admitting: Hematology & Oncology

## 2018-06-02 LAB — PORPHYRINS, FRACTIONATED URINE (TIMED COLLECTION)
Copropor(CP)III,24hr: 39 ug/24 hr (ref 0–74)
Coproporph(CP)I,24hr: 63 ug/24 hr — ABNORMAL HIGH (ref 0–24)
Coproporphyrin I: 70 ug/L
Coproporphyrin III: 43 ug/L
HEPTACARBOXYL (7-CP): 4 ug/L
HEPTACARBOXYL PORPHYRIN 24H UR: 4 ug/(24.h) (ref 0–4)
Hexacarboxyporphyrin: 0 ug/24 hr (ref 0–1)
Pentacarboxyl (5-CP): 2 ug/L
Pentacarboxyporphyrin: 2 ug/24 hr (ref 0–4)
Total Volume: 900
UROPORPHYRIN, URINE: 15 ug/(24.h) (ref 0–24)
UROPORPHYRINS (UP): 17 ug/L

## 2018-06-15 ENCOUNTER — Inpatient Hospital Stay: Payer: Medicaid Other

## 2018-06-15 ENCOUNTER — Other Ambulatory Visit: Payer: Self-pay

## 2018-06-15 ENCOUNTER — Inpatient Hospital Stay (HOSPITAL_BASED_OUTPATIENT_CLINIC_OR_DEPARTMENT_OTHER): Payer: Medicaid Other | Admitting: Hematology & Oncology

## 2018-06-15 ENCOUNTER — Other Ambulatory Visit: Payer: Self-pay | Admitting: *Deleted

## 2018-06-15 ENCOUNTER — Telehealth: Payer: Self-pay | Admitting: *Deleted

## 2018-06-15 VITALS — BP 119/43 | HR 72 | Temp 98.0°F | Resp 20 | Wt 118.0 lb

## 2018-06-15 DIAGNOSIS — D61818 Other pancytopenia: Secondary | ICD-10-CM | POA: Diagnosis not present

## 2018-06-15 DIAGNOSIS — D696 Thrombocytopenia, unspecified: Secondary | ICD-10-CM

## 2018-06-15 LAB — CMP (CANCER CENTER ONLY)
ALT: 38 U/L (ref 10–47)
ANION GAP: 9 (ref 5–15)
AST: 95 U/L — AB (ref 11–38)
Albumin: 3.3 g/dL — ABNORMAL LOW (ref 3.5–5.0)
Alkaline Phosphatase: 185 U/L — ABNORMAL HIGH (ref 26–84)
BUN: 6 mg/dL — AB (ref 7–22)
CALCIUM: 9.3 mg/dL (ref 8.0–10.3)
CO2: 27 mmol/L (ref 18–33)
Chloride: 99 mmol/L (ref 98–108)
Creatinine: 0.9 mg/dL (ref 0.60–1.20)
GLUCOSE: 115 mg/dL (ref 73–118)
POTASSIUM: 3.5 mmol/L (ref 3.3–4.7)
SODIUM: 135 mmol/L (ref 128–145)
Total Bilirubin: 4.7 mg/dL (ref 0.2–1.6)
Total Protein: 8.3 g/dL — ABNORMAL HIGH (ref 6.4–8.1)

## 2018-06-15 LAB — CBC WITH DIFFERENTIAL (CANCER CENTER ONLY)
Basophils Absolute: 0 10*3/uL (ref 0.0–0.1)
Basophils Relative: 0 %
EOS ABS: 0.1 10*3/uL (ref 0.0–0.5)
EOS PCT: 3 %
HCT: 28.4 % — ABNORMAL LOW (ref 34.8–46.6)
Hemoglobin: 9.5 g/dL — ABNORMAL LOW (ref 11.6–15.9)
LYMPHS PCT: 17 %
Lymphs Abs: 0.6 10*3/uL — ABNORMAL LOW (ref 0.9–3.3)
MCH: 34.8 pg — ABNORMAL HIGH (ref 26.0–34.0)
MCHC: 33.5 g/dL (ref 32.0–36.0)
MCV: 104 fL — AB (ref 81.0–101.0)
MONO ABS: 0.3 10*3/uL (ref 0.1–0.9)
Monocytes Relative: 8 %
Neutro Abs: 2.8 10*3/uL (ref 1.5–6.5)
Neutrophils Relative %: 72 %
Platelet Count: 64 10*3/uL — ABNORMAL LOW (ref 145–400)
RBC: 2.73 MIL/uL — AB (ref 3.70–5.32)
RDW: 14.8 % (ref 11.1–15.7)
WBC Count: 3.8 10*3/uL — ABNORMAL LOW (ref 3.9–10.0)

## 2018-06-15 LAB — LACTATE DEHYDROGENASE: LDH: 157 U/L (ref 98–192)

## 2018-06-15 LAB — SAMPLE TO BLOOD BANK

## 2018-06-15 LAB — PROTIME-INR
INR: 1.54
Prothrombin Time: 18.4 seconds — ABNORMAL HIGH (ref 11.4–15.2)

## 2018-06-15 LAB — APTT: APTT: 42 s — AB (ref 24–36)

## 2018-06-15 NOTE — Progress Notes (Signed)
Hematology and Oncology Follow Up Visit  Jamie Allen [id number] [birth year] 47 y.o. 06/15/2018   Principle Diagnosis:   Anemia - multi-factorial  Cirrhosis w/ splenomegaly  Current Therapy:    Aranesp 300 mcg sq q 3 wks for Hgb < 11  IV Iron as needed  Folic acid 1 mg po q day     Interim History:  Jamie Allen is in for her follow-up.  She has had for her oral surgery on August 30.  She will have all of her teeth taken out.  This I think will be very helpful for her.  May be, and might help her blood counts.  We did do a 24-hour urine on her.  She did have a mildly elevated level of coproporphyrin.  I am not sure exactly what this signifies.  Her 24-hour level was 63.  She has had no fever.  She has had no rashes.  She is negative for cryoglobulins.  I just want to get her ready for her surgery.  She will need some antibiotics to take prior to surgery from my perspective.  I will call in some amoxicillin for her.  She has had no headache.  She still has lost a little bit of weight.  Overall, her is performance status is ECOG 1.  Medications:  Current Outpatient Medications:  .  esomeprazole (NEXIUM) 20 MG capsule, Take 20 mg by mouth daily. , Disp: , Rfl:  .  lipase/protease/amylase (CREON) 36000 UNITS CPEP capsule, Take 1 capsule (36,000 Units total) by mouth 3 (three) times daily before meals., Disp: 90 capsule, Rfl: 0 .  Melatonin 10 MG TABS, Take 10 mg by mouth at bedtime as needed (for sleep)., Disp: , Rfl:  .  ondansetron (ZOFRAN) 4 MG tablet, Take 1 tablet (4 mg total) by mouth every 6 (six) hours as needed for nausea., Disp: 20 tablet, Rfl: 0 .  potassium chloride (KLOR-CON) 8 MEQ tablet, Take 2 tablets (16 mEq total) by mouth daily. (Patient taking differently: Take 16 mEq by mouth daily as needed (chest pain). ), Disp: 60 tablet, Rfl: 3 .  pregabalin (LYRICA) 50 MG capsule, Take 1 capsule (50 mg total) by mouth 3 (three) times daily., Disp: 540 capsule,  Rfl: 1 .  sertraline (ZOLOFT) 50 MG tablet, Take 1 tablet (50 mg total) by mouth daily., Disp: 30 tablet, Rfl: 2 .  traMADol (ULTRAM) 50 MG tablet, Take 1 tablet (50 mg total) by mouth 2 (two) times daily as needed. To fill on or after 06/24/2018, Disp: 60 tablet, Rfl: 0 .  vitamin B-12 (CYANOCOBALAMIN) 1000 MCG tablet, Take 1,000 mcg by mouth every other day., Disp: , Rfl:   Allergies:  Allergies  Allergen Reactions  . Iohexol Hives, Itching and Swelling  . Lactose Intolerance (Gi) Diarrhea and Nausea Only  . Other Other (See Comments)    Spicy foods "tear up my stomach"  . Vancomycin Itching    Nose itching  . Morphine And Related Itching and Other (See Comments)    Can tolerate with Benadryl    Past Medical History, Surgical history, Social history, and Family History were reviewed and updated.  Review of Systems: Review of Systems  Constitutional: Positive for fatigue.  HENT:   Positive for sore throat and trouble swallowing.   Eyes: Negative.   Respiratory: Positive for shortness of breath.   Cardiovascular: Positive for palpitations.  Gastrointestinal: Positive for abdominal distention, abdominal pain, diarrhea and nausea.  Endocrine: Negative.   Genitourinary: Positive for frequency.  Musculoskeletal: Positive for arthralgias, gait problem and myalgias.  Skin: Negative.   Neurological: Positive for dizziness and gait problem.  Hematological: Negative.   Psychiatric/Behavioral: Negative.     Physical Exam:  weight is 118 lb (53.5 kg). Her oral temperature is 98 F (36.7 C). Her blood pressure is 119/43 (abnormal) and her pulse is 72. Her respiration is 20 and oxygen saturation is 99%.   Wt Readings from Last 3 Encounters:  06/15/18 118 lb (53.5 kg)  05/28/18 121 lb (54.9 kg)  04/07/18 123 lb 8 oz (56 kg)    Physical Exam  Constitutional: She is oriented to person, place, and time.  HENT:  Head: Normocephalic and atraumatic.  Mouth/Throat: Oropharynx is clear  and moist.  Eyes: Pupils are equal, round, and reactive to light. EOM are normal.  Neck: Normal range of motion.  Cardiovascular: Normal rate, regular rhythm and normal heart sounds.  Pulmonary/Chest: Effort normal and breath sounds normal.  Abdominal: Soft. Bowel sounds are normal.  Musculoskeletal: Normal range of motion. She exhibits no edema, tenderness or deformity.  Lymphadenopathy:    She has no cervical adenopathy.  Neurological: She is alert and oriented to person, place, and time.  Skin: Skin is warm and dry. No rash noted. No erythema.  Psychiatric: She has a normal mood and affect. Her behavior is normal. Judgment and thought content normal.  Vitals reviewed.    Lab Results  Component Value Date   WBC 3.8 (L) 06/15/2018   HGB 9.5 (L) 06/15/2018   HCT 28.4 (L) 06/15/2018   MCV 104.0 (H) 06/15/2018   PLT 64 (L) 06/15/2018     Chemistry      Component Value Date/Time   NA 136 05/28/2018 1147   NA 136 08/13/2017 1521   K 4.2 05/28/2018 1147   CL 100 05/28/2018 1147   CO2 28 05/28/2018 1147   BUN 9 05/28/2018 1147   BUN 5 (L) 08/13/2017 1521   CREATININE 0.90 05/28/2018 1147   CREATININE 1.01 05/19/2016 1639      Component Value Date/Time   CALCIUM 9.4 05/28/2018 1147   ALKPHOS 190 (H) 05/28/2018 1147   AST 80 (H) 05/28/2018 1147   ALT 30 05/28/2018 1147   BILITOT 3.1 (H) 05/28/2018 1147       Impression and Plan: Jamie Allen is a 47 year old white female.  She has mild pancytopenia.    We will go ahead and do the platelet transfusion the day before her surgery.  I think this would be quite helpful.  I will also think about given her a dose of Aranesp.  I think this might be helpful.  I am checking her von Willebrand levels.  I want to make sure that she does not have any bleeding with the extractions.  I would like to see her back probably in about a month.  She will not have her dentures for about 6-8 weeks after she has the extractions.  She goes for  her splenic ultrasound tomorrow.  Volanda Napoleon, MD 8/27/20191:22 PM

## 2018-06-15 NOTE — Telephone Encounter (Signed)
Critical Value Total Bili 4.7 Dr Marin Olp notified. No orders at this time.

## 2018-06-16 ENCOUNTER — Encounter (HOSPITAL_COMMUNITY): Payer: Self-pay | Admitting: *Deleted

## 2018-06-16 ENCOUNTER — Other Ambulatory Visit: Payer: Self-pay

## 2018-06-16 ENCOUNTER — Ambulatory Visit (HOSPITAL_BASED_OUTPATIENT_CLINIC_OR_DEPARTMENT_OTHER)
Admission: RE | Admit: 2018-06-16 | Discharge: 2018-06-16 | Disposition: A | Discharge status: Home / Self Care | Payer: Medicaid Other | Source: Ambulatory Visit | Attending: Hematology & Oncology | Admitting: Hematology & Oncology

## 2018-06-16 DIAGNOSIS — N289 Disorder of kidney and ureter, unspecified: Secondary | ICD-10-CM | POA: Insufficient documentation

## 2018-06-16 DIAGNOSIS — K7460 Unspecified cirrhosis of liver: Secondary | ICD-10-CM | POA: Diagnosis not present

## 2018-06-16 DIAGNOSIS — D61818 Other pancytopenia: Secondary | ICD-10-CM

## 2018-06-16 DIAGNOSIS — R161 Splenomegaly, not elsewhere classified: Secondary | ICD-10-CM | POA: Diagnosis not present

## 2018-06-16 LAB — IRON AND TIBC
Iron: 152 ug/dL — ABNORMAL HIGH (ref 41–142)
Saturation Ratios: 95 % — ABNORMAL HIGH (ref 21–57)
TIBC: 160 ug/dL — ABNORMAL LOW (ref 236–444)
UIBC: 8 ug/dL

## 2018-06-16 LAB — VON WILLEBRAND PANEL
COAGULATION FACTOR VIII: 202 % — AB (ref 56–140)
Ristocetin Co-factor, Plasma: 340 % — ABNORMAL HIGH (ref 50–200)
Von Willebrand Antigen, Plasma: 433 % — ABNORMAL HIGH (ref 50–200)

## 2018-06-16 LAB — COAG STUDIES INTERP REPORT

## 2018-06-16 LAB — FERRITIN: Ferritin: 2166 ng/mL — ABNORMAL HIGH (ref 11–307)

## 2018-06-16 LAB — COLD AGGLUTININ TITER: COLD AGGLUTININ TITER: NEGATIVE (ref <1:32)

## 2018-06-16 NOTE — Progress Notes (Signed)
Spoke with pt for pre-op call. Pt states she's been told she has a heart murmur, but it hasn't given her any problems. She denies chest pain or sob. Pt has hx of HTN, but no longer on medications. Pt does state that she has been out of her thyroid medication for over a month. Pt states she is anemic, but has never been told she had a clotting problem.

## 2018-06-17 ENCOUNTER — Inpatient Hospital Stay: Payer: Medicaid Other

## 2018-06-17 DIAGNOSIS — D696 Thrombocytopenia, unspecified: Secondary | ICD-10-CM

## 2018-06-17 DIAGNOSIS — D61818 Other pancytopenia: Secondary | ICD-10-CM | POA: Diagnosis not present

## 2018-06-17 MED ORDER — DIPHENHYDRAMINE HCL 25 MG PO CAPS
ORAL_CAPSULE | ORAL | Status: AC
  Filled 2018-06-17: qty 1

## 2018-06-17 MED ORDER — METHYLPREDNISOLONE SODIUM SUCC 40 MG IJ SOLR
INTRAMUSCULAR | Status: AC
  Filled 2018-06-17: qty 1

## 2018-06-17 MED ORDER — ACETAMINOPHEN 325 MG PO TABS
ORAL_TABLET | ORAL | Status: AC
  Filled 2018-06-17: qty 2

## 2018-06-17 MED ORDER — AMOXICILLIN 500 MG PO CAPS: ORAL_CAPSULE | ORAL | 0 refills | Status: DC

## 2018-06-17 MED ORDER — ONDANSETRON HCL 4 MG PO TABS: 4.0000 mg | ORAL_TABLET | Freq: Four times a day (QID) | ORAL | 2 refills | Status: DC | PRN

## 2018-06-17 MED ORDER — DIPHENHYDRAMINE HCL 25 MG PO CAPS
25.0000 mg | ORAL_CAPSULE | Freq: Once | ORAL | Status: AC
  Administered 2018-06-17: 25 mg via ORAL

## 2018-06-17 MED ORDER — ACETAMINOPHEN 325 MG PO TABS
650.0000 mg | ORAL_TABLET | Freq: Once | ORAL | Status: AC
  Administered 2018-06-17: 650 mg via ORAL

## 2018-06-17 MED ORDER — METHYLPREDNISOLONE SODIUM SUCC 40 MG IJ SOLR
40.0000 mg | Freq: Once | INTRAMUSCULAR | Status: AC
  Administered 2018-06-17: 40 mg via INTRAVENOUS

## 2018-06-17 NOTE — Patient Instructions (Signed)
Thrombocytopenia Thrombocytopenia means that you have a low number of platelets in your blood. Platelets are tiny cells in the blood. When you bleed, they clump together at the cut or injury to stop the bleeding. This is called blood clotting. Not having enough platelets can cause bleeding problems. Follow these instructions at home: General instructions  Check your skin and inside your mouth for bruises or blood as told by your doctor.  Check to see if there is blood in your spit (sputum), pee (urine), and poop (stool). Do this as told by your doctor.  Ask your doctor if you can drink alcohol.  Take over-the-counter and prescription medicines only as told by your doctor.  Tell all of your doctors that you have this condition. Be sure to tell your dentist and eye doctor too. Activity  Do not do activities that can cause bumps or bruises until your doctor says it is okay.  Be careful not to cut yourself: ? When you shave. ? When you use scissors, needles, knives, or other tools.  Be careful not to burn yourself: ? When you use an iron. ? When you cook. Contact a doctor if:  You have bruises and you do not know why. Get help right away if:  You are bleeding anywhere on your body.  You have blood in your spit, pee, or poop. This information is not intended to replace advice given to you by your health care provider. Make sure you discuss any questions you have with your health care provider. Document Released: 09/25/2011 Document Revised: 06/08/2016 Document Reviewed: 04/09/2015 Elsevier Interactive Patient Education  2018 [employer] Inc[employer]   
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
## 2018-06-18 ENCOUNTER — Encounter (HOSPITAL_COMMUNITY): Payer: Self-pay | Admitting: *Deleted

## 2018-06-18 ENCOUNTER — Ambulatory Visit (HOSPITAL_COMMUNITY): Payer: Medicaid Other | Admitting: Physician Assistant

## 2018-06-18 ENCOUNTER — Ambulatory Visit (HOSPITAL_COMMUNITY)
Admission: RE | Admit: 2018-06-18 | Discharge: 2018-06-18 | Disposition: A | Discharge status: Home / Self Care | Payer: Medicaid Other | Source: Ambulatory Visit | Attending: Oral Surgery | Admitting: Oral Surgery

## 2018-06-18 ENCOUNTER — Encounter (HOSPITAL_COMMUNITY)
Admission: RE | Disposition: A | Discharge status: Home / Self Care | Payer: Self-pay | Source: Ambulatory Visit | Attending: Oral Surgery

## 2018-06-18 DIAGNOSIS — G629 Polyneuropathy, unspecified: Secondary | ICD-10-CM | POA: Insufficient documentation

## 2018-06-18 DIAGNOSIS — F419 Anxiety disorder, unspecified: Secondary | ICD-10-CM | POA: Insufficient documentation

## 2018-06-18 DIAGNOSIS — I272 Pulmonary hypertension, unspecified: Secondary | ICD-10-CM | POA: Insufficient documentation

## 2018-06-18 DIAGNOSIS — E039 Hypothyroidism, unspecified: Secondary | ICD-10-CM | POA: Insufficient documentation

## 2018-06-18 DIAGNOSIS — I10 Essential (primary) hypertension: Secondary | ICD-10-CM | POA: Diagnosis not present

## 2018-06-18 DIAGNOSIS — K219 Gastro-esophageal reflux disease without esophagitis: Secondary | ICD-10-CM | POA: Diagnosis not present

## 2018-06-18 DIAGNOSIS — K029 Dental caries, unspecified: Secondary | ICD-10-CM | POA: Diagnosis present

## 2018-06-18 DIAGNOSIS — K0530 Chronic periodontitis, unspecified: Secondary | ICD-10-CM | POA: Diagnosis not present

## 2018-06-18 DIAGNOSIS — K7030 Alcoholic cirrhosis of liver without ascites: Secondary | ICD-10-CM | POA: Diagnosis not present

## 2018-06-18 DIAGNOSIS — F17210 Nicotine dependence, cigarettes, uncomplicated: Secondary | ICD-10-CM | POA: Insufficient documentation

## 2018-06-18 DIAGNOSIS — Z885 Allergy status to narcotic agent status: Secondary | ICD-10-CM | POA: Diagnosis not present

## 2018-06-18 DIAGNOSIS — F4310 Post-traumatic stress disorder, unspecified: Secondary | ICD-10-CM | POA: Diagnosis not present

## 2018-06-18 HISTORY — PX: TOOTH EXTRACTION: SHX859

## 2018-06-18 HISTORY — DX: Pneumonia, unspecified organism: J18.9

## 2018-06-18 LAB — BPAM PLATELET PHERESIS
Blood Product Expiration Date: 201908312359
ISSUE DATE / TIME: 201908290741
UNIT TYPE AND RH: 9500

## 2018-06-18 LAB — PREPARE PLATELET PHERESIS: UNIT DIVISION: 0

## 2018-06-18 SURGERY — DENTAL RESTORATION/EXTRACTIONS
Anesthesia: General | Site: Mouth

## 2018-06-18 MED ORDER — LACTATED RINGERS IV SOLN
Freq: Once | INTRAVENOUS | Status: AC
  Administered 2018-06-18: 08:00:00 via INTRAVENOUS

## 2018-06-18 MED ORDER — LIDOCAINE HCL (CARDIAC) PF 100 MG/5ML IV SOSY
PREFILLED_SYRINGE | INTRAVENOUS | Status: DC | PRN
  Administered 2018-06-18: 60 mg via INTRAVENOUS

## 2018-06-18 MED ORDER — LIDOCAINE-EPINEPHRINE 2 %-1:100000 IJ SOLN
INTRAMUSCULAR | Status: AC
  Filled 2018-06-18: qty 1

## 2018-06-18 MED ORDER — HEMOSTATIC AGENTS (NO CHARGE) OPTIME
TOPICAL | Status: DC | PRN
  Administered 2018-06-18 (×2): 1 via TOPICAL

## 2018-06-18 MED ORDER — MIDAZOLAM HCL 5 MG/5ML IJ SOLN
INTRAMUSCULAR | Status: DC | PRN
  Administered 2018-06-18: 2 mg via INTRAVENOUS

## 2018-06-18 MED ORDER — SUCCINYLCHOLINE CHLORIDE 20 MG/ML IJ SOLN
INTRAMUSCULAR | Status: DC | PRN
  Administered 2018-06-18: 100 mg via INTRAVENOUS

## 2018-06-18 MED ORDER — SODIUM CHLORIDE 0.9 % IR SOLN
Status: DC | PRN
  Administered 2018-06-18: 1

## 2018-06-18 MED ORDER — PHENYLEPHRINE 40 MCG/ML (10ML) SYRINGE FOR IV PUSH (FOR BLOOD PRESSURE SUPPORT)
PREFILLED_SYRINGE | INTRAVENOUS | Status: AC
  Filled 2018-06-18: qty 30

## 2018-06-18 MED ORDER — DEXAMETHASONE SODIUM PHOSPHATE 10 MG/ML IJ SOLN
INTRAMUSCULAR | Status: AC
  Filled 2018-06-18: qty 3

## 2018-06-18 MED ORDER — FENTANYL CITRATE (PF) 100 MCG/2ML IJ SOLN
INTRAMUSCULAR | Status: AC
  Filled 2018-06-18: qty 2

## 2018-06-18 MED ORDER — PHENYLEPHRINE HCL 10 MG/ML IJ SOLN
INTRAMUSCULAR | Status: DC | PRN
  Administered 2018-06-18: 80 ug via INTRAVENOUS
  Administered 2018-06-18: 120 ug via INTRAVENOUS

## 2018-06-18 MED ORDER — EPHEDRINE 5 MG/ML INJ
INTRAVENOUS | Status: AC
  Filled 2018-06-18: qty 10

## 2018-06-18 MED ORDER — FENTANYL CITRATE (PF) 100 MCG/2ML IJ SOLN
25.0000 ug | INTRAMUSCULAR | Status: DC | PRN
  Administered 2018-06-18 (×2): 25 ug via INTRAVENOUS

## 2018-06-18 MED ORDER — SUCCINYLCHOLINE CHLORIDE 200 MG/10ML IV SOSY
PREFILLED_SYRINGE | INTRAVENOUS | Status: AC
  Filled 2018-06-18: qty 30

## 2018-06-18 MED ORDER — FENTANYL CITRATE (PF) 100 MCG/2ML IJ SOLN
INTRAMUSCULAR | Status: DC | PRN
  Administered 2018-06-18 (×2): 50 ug via INTRAVENOUS
  Administered 2018-06-18: 100 ug via INTRAVENOUS

## 2018-06-18 MED ORDER — ONDANSETRON HCL 4 MG/2ML IJ SOLN: 4.0000 mg | Freq: Once | INTRAMUSCULAR | Status: DC | PRN

## 2018-06-18 MED ORDER — AMOXICILLIN 500 MG PO CAPS: 500.0000 mg | ORAL_CAPSULE | Freq: Three times a day (TID) | ORAL | 0 refills | Status: DC

## 2018-06-18 MED ORDER — 0.9 % SODIUM CHLORIDE (POUR BTL) OPTIME
TOPICAL | Status: DC | PRN
  Administered 2018-06-18: 1000 mL

## 2018-06-18 MED ORDER — OXYCODONE-ACETAMINOPHEN 5-325 MG PO TABS: 1.0000 | ORAL_TABLET | ORAL | 0 refills | Status: DC | PRN

## 2018-06-18 MED ORDER — MIDAZOLAM HCL 2 MG/2ML IJ SOLN
INTRAMUSCULAR | Status: AC
  Filled 2018-06-18: qty 2

## 2018-06-18 MED ORDER — CEFAZOLIN SODIUM-DEXTROSE 2-4 GM/100ML-% IV SOLN
2.0000 g | INTRAVENOUS | Status: AC
  Administered 2018-06-18: 2 g via INTRAVENOUS
  Filled 2018-06-18: qty 100

## 2018-06-18 MED ORDER — ONDANSETRON HCL 4 MG/2ML IJ SOLN
INTRAMUSCULAR | Status: DC | PRN
  Administered 2018-06-18: 4 mg via INTRAVENOUS

## 2018-06-18 MED ORDER — DEXAMETHASONE SODIUM PHOSPHATE 4 MG/ML IJ SOLN
INTRAMUSCULAR | Status: DC | PRN
  Administered 2018-06-18: 10 mg via INTRAVENOUS

## 2018-06-18 MED ORDER — ETOMIDATE 2 MG/ML IV SOLN
INTRAVENOUS | Status: AC
  Filled 2018-06-18: qty 10

## 2018-06-18 MED ORDER — ONDANSETRON HCL 4 MG/2ML IJ SOLN
INTRAMUSCULAR | Status: AC
  Filled 2018-06-18: qty 6

## 2018-06-18 MED ORDER — LIDOCAINE-EPINEPHRINE 2 %-1:100000 IJ SOLN
INTRAMUSCULAR | Status: DC | PRN
  Administered 2018-06-18: 20 mL via INTRADERMAL

## 2018-06-18 MED ORDER — GLYCOPYRROLATE PF 0.2 MG/ML IJ SOSY
PREFILLED_SYRINGE | INTRAMUSCULAR | Status: AC
  Filled 2018-06-18: qty 1

## 2018-06-18 MED ORDER — FENTANYL CITRATE (PF) 250 MCG/5ML IJ SOLN
INTRAMUSCULAR | Status: AC
  Filled 2018-06-18: qty 5

## 2018-06-18 MED ORDER — PROPOFOL 10 MG/ML IV BOLUS
INTRAVENOUS | Status: DC | PRN
  Administered 2018-06-18: 90 mg via INTRAVENOUS

## 2018-06-18 MED ORDER — LACTATED RINGERS IV SOLN
INTRAVENOUS | Status: DC | PRN
  Administered 2018-06-18: 09:00:00 via INTRAVENOUS

## 2018-06-18 SURGICAL SUPPLY — 32 items
BUR CROSS CUT FISSURE 1.6 (BURR) ×2 IMPLANT
BUR CROSS CUT FISSURE 1.6MM (BURR) ×1
BUR EGG ELITE 4.0 (BURR) ×2 IMPLANT
BUR EGG ELITE 4.0MM (BURR) ×1
CANISTER SUCT 3000ML PPV (MISCELLANEOUS) ×3 IMPLANT
COVER SURGICAL LIGHT HANDLE (MISCELLANEOUS) ×3 IMPLANT
DRAPE U-SHAPE 76X120 STRL (DRAPES) ×3 IMPLANT
GAUZE PACKING FOLDED 2  STR (GAUZE/BANDAGES/DRESSINGS) ×2
GAUZE PACKING FOLDED 2 STR (GAUZE/BANDAGES/DRESSINGS) ×1 IMPLANT
GLOVE BIO SURGEON STRL SZ 6.5 (GLOVE) ×2 IMPLANT
GLOVE BIO SURGEON STRL SZ7 (GLOVE) IMPLANT
GLOVE BIO SURGEON STRL SZ7.5 (GLOVE) ×3 IMPLANT
GLOVE BIO SURGEONS STRL SZ 6.5 (GLOVE) ×1
GLOVE BIOGEL PI IND STRL 6.5 (GLOVE) IMPLANT
GLOVE BIOGEL PI IND STRL 7.0 (GLOVE) ×1 IMPLANT
GLOVE BIOGEL PI INDICATOR 6.5 (GLOVE)
GLOVE BIOGEL PI INDICATOR 7.0 (GLOVE) ×2
GOWN STRL REUS W/ TWL LRG LVL3 (GOWN DISPOSABLE) ×1 IMPLANT
GOWN STRL REUS W/ TWL XL LVL3 (GOWN DISPOSABLE) ×1 IMPLANT
GOWN STRL REUS W/TWL LRG LVL3 (GOWN DISPOSABLE) ×2
GOWN STRL REUS W/TWL XL LVL3 (GOWN DISPOSABLE) ×2
KIT BASIN OR (CUSTOM PROCEDURE TRAY) ×3 IMPLANT
KIT TURNOVER KIT B (KITS) ×3 IMPLANT
NEEDLE 22X1 1/2 (OR ONLY) (NEEDLE) ×6 IMPLANT
NS IRRIG 1000ML POUR BTL (IV SOLUTION) ×3 IMPLANT
PAD ARMBOARD 7.5X6 YLW CONV (MISCELLANEOUS) ×3 IMPLANT
SPONGE SURGIFOAM ABS GEL 12-7 (HEMOSTASIS) ×6 IMPLANT
SUT CHROMIC 3 0 PS 2 (SUTURE) ×6 IMPLANT
SYR CONTROL 10ML LL (SYRINGE) ×3 IMPLANT
TRAY ENT MC OR (CUSTOM PROCEDURE TRAY) ×3 IMPLANT
TUBING IRRIGATION (MISCELLANEOUS) ×3 IMPLANT
YANKAUER SUCT BULB TIP NO VENT (SUCTIONS) ×3 IMPLANT

## 2018-06-18 NOTE — Anesthesia Procedure Notes (Signed)
Procedure Name: Intubation Date/Time: 06/18/2018 9:19 AM Performed by: Kierria Feigenbaum T, CRNA Pre-anesthesia Checklist: Patient identified, Emergency Drugs available, Suction available and Patient being monitored Patient Re-evaluated:Patient Re-evaluated prior to induction Oxygen Delivery Method: Circle system utilized Preoxygenation: Pre-oxygenation with 100% oxygen Induction Type: IV induction Ventilation: Mask ventilation without difficulty Laryngoscope Size: Mac and 3 Grade View: Grade I Nasal Tubes: Right, Nasal prep performed, Nasal Rae and Magill forceps- large, utilized Tube size: 7.0 mm Number of attempts: 1 Airway Equipment and Method: Patient positioned with wedge pillow Placement Confirmation: ETT inserted through vocal cords under direct vision,  positive ETCO2 and breath sounds checked- equal and bilateral Tube secured with: Tape Dental Injury: Bloody posterior oropharynx

## 2018-06-18 NOTE — Op Note (Signed)
NAME: Jamie Allen, Jamie Allen [facility] MEDICAL RECORD FA:[id number] ACCOUNT [id number] DATE OF BIRTH:24-Nov-1970 FACILITY: [facility] LOCATION: [facility] PHYSICIAN:Pragya Lofaso M. Deshana Rominger, DDS  OPERATIVE REPORT  DATE OF PROCEDURE:  06/18/2018  PREOPERATIVE DIAGNOSES:   1.  Rampant dental caries.   2.  Periodontitis.   3.  Mandibular tori.  POSTOPERATIVE DIAGNOSES:   1.  Rampant dental caries.   2.  Periodontitis.   3.  Mandibular tori.  PROCEDURE:   1.  Extraction teeth #3, 4, 5, 6, 9, 10, 11, 12, 13, 21, 22, 23, 24, 25, 26, 27, 28, 29, 30.   2.  Alveoplasty right and left maxilla and mandible.   3.  Removal of bilateral mandibular lingual tori.  SURGEON:  Diona Browner, DDS  ANESTHESIA:  General nasal intubation.  DESCRIPTION OF PROCEDURE:  The patient was taken to the operating room and placed on the table in supine position.  General anesthesia was administered intravenously and a nasal endotracheal tube was placed and secured.  The eyes were protected.  The  patient was draped for surgery.  A timeout was performed.  The posterior pharynx was suctioned and a throat pack was placed.  Two percent  lidocaine, 1:100,000 epinephrine was infiltrated in the inferior alveolar block on the right and left sides and  buccal and palatal infiltration of the maxilla total of 16 mL was utilized.  A bite block was placed on the right side of the mouth and a sweetheart retractor was used to retract the tongue.  A 15 blade was used to make an incision around teeth #21, 22,  23, 24, 25 and 26 in the mandible.  The periosteum was reflected from around these teeth.  The teeth were elevated with a 301 elevator and then teeth numbers 21, 23, 24, 25 and 26 were removed using the Ash forceps.  Tooth #22 required removal of bone  around the tooth circumferentially.  Then, the tooth was grasped with the Ash forceps and removed.  Then the sockets were curetted.  The periosteum was reflected to expose the alveolar crest on the lingual  torus.  Alveoplasty was performed using an  egg-shaped bur and then the lingual torus was removed using the egg-shaped bur.  The area was further smoothed with a bone file and then Gelfoam sponges were placed in the extraction sockets and the areas were sutured with 3-0 chromic gut.  The 15 blade  was used to make an incision around teeth #9, 10, 11, 12, 13 in the maxilla.  The periosteum was reflected.  The teeth were elevated with a 301 elevator and removed from the mouth with the dental forceps.  The sockets were curetted.  The periosteum was  reflected to expose the alveolar crest.  Alveoplasty was performed using an egg-shaped bur and bone file.  The tissue was trimmed to allow for primary closure as it had been done in the mandible on the left side.  Then, Gelfoam was placed in the sockets  and then the area was sutured with 3-0 chromic.  Then, the bite block was repositioned to the other side of the mouth and a sweetheart retractor was repositioned as well .  A 15 blade used to make an incision around 27, 28, 29 and 30 in the gingival  sulcus.  The tissue was reflected with a periosteal elevator.  The teeth were elevated with a 301 elevator and removed from the mouth with the dental forceps.  Then, the sockets were curetted.  The tissue was trimmed for primary  closure and then  alveoplasty and torus reduction was performed using the egg-shaped bur and bone file.  Then, the area was irrigated and closed with 3-0 chromic.  Then, the incision was made around teeth numbers 3, 4 5 and 6.  The periosteum was reflected.  The teeth  were elevated and removed from that with the dental forceps.  The sockets were curetted.  Alveoplasty was performed.  Then, the sockets were stuffed with Gelfoam sponge and then the area was sutured primarily.  Then, the oral cavity was irrigated and  suctioned, throat pack was removed.  The patient appeared hemostatic.  ESTIMATED BLOOD LOSS:  Minimal.  COMPLICATIONS:   None.  SPECIMENS:  None.  DISPOSITION:   The patient was left in the care of Anesthesia for transport to recovery room with plans for discharge.  AN/NUANCE  D:06/18/2018 T:06/18/2018 JOB:[id number]

## 2018-06-18 NOTE — Anesthesia Postprocedure Evaluation (Signed)
Anesthesia Post Note  Patient: Jamie Allen  Procedure(s) Performed: Extraction teeth number three, four, five, six, nine, ten, eleven, twelve, thirteen, twenty one, twenty two, twenty three, twenty four, twenty five, twenty six, twenty seven, twenty eight, twenty nine, and thirty.  Alveoloplasty and removal of bilateral mandibular tori. (N/A Mouth)     Patient location during evaluation: PACU Anesthesia Type: General Level of consciousness: awake and alert Pain management: pain level controlled Vital Signs Assessment: post-procedure vital signs reviewed and stable Respiratory status: spontaneous breathing, nonlabored ventilation and respiratory function stable Cardiovascular status: blood pressure returned to baseline and stable Postop Assessment: no apparent nausea or vomiting Anesthetic complications: no    Last Vitals:  Vitals:   06/18/18 0822 06/18/18 1016  BP: (!) 145/61 (!) 123/50  Pulse: 72 98  Resp: 20 18  Temp: 36.8 C 37 C  SpO2: 100% 97%                  Audry Pili
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
## 2018-06-18 NOTE — Transfer of Care (Signed)
Immediate Anesthesia Transfer of Care Note  Patient: Jamie Allen  Procedure(s) Performed: Extraction teeth number three, four, five, six, nine, ten, eleven, twelve, thirteen, twenty one, twenty two, twenty three, twenty four, twenty five, twenty six, twenty seven, twenty eight, twenty nine, and thirty.  Alveoloplasty and removal of bilateral mandibular tori. (N/A Mouth)  Patient Location: PACU  Anesthesia Type:General  Level of Consciousness: awake, alert  and oriented  Airway & Oxygen Therapy: Patient Spontanous Breathing and Patient connected to face mask oxygen  Post-op Assessment: Report given to RN, Post -op Vital signs reviewed and stable and Patient moving all extremities  Post vital signs: Reviewed and stable  Last Vitals:  Vitals Value Taken Time  BP 123/50 06/18/2018 10:16 AM  Temp    Pulse 98 06/18/2018 10:17 AM  Resp 18 06/18/2018 10:17 AM  SpO2 99 % 06/18/2018 10:17 AM  Vitals shown include unvalidated device data.  Last Pain:  Vitals:   06/18/18 0822  TempSrc: Oral  PainSc:       Patients Stated Pain Goal: 3 (88/75/79 7282)  Complications: No apparent anesthesia complications

## 2018-06-18 NOTE — Op Note (Signed)
06/18/2018  10:06 AM  PATIENT:  Jamie Allen  47 y.o. female  PRE-OPERATIVE DIAGNOSIS:  dental caries, periodontitis, mandibular tori  POST-OPERATIVE DIAGNOSIS:  SAME  PROCEDURE:  Procedure(s): Extraction teeth number three, four, five, six, nine, ten, eleven, twelve, thirteen, twenty one, twenty two, twenty three, twenty four, twenty five, twenty six, twenty seven, twenty eight, twenty nine, and thirty.  Alveoloplasty and removal of bilateral mandibular tori.  SURGEON:  Surgeon(s): Diona Browner, DDS  ANESTHESIA:   local and general  EBL:  minimal  DRAINS: none   SPECIMEN:  No Specimen  COUNTS:  YES  PLAN OF CARE: Discharge to home after PACU  PATIENT DISPOSITION:  PACU - hemodynamically stable.   PROCEDURE DETAILS: Dictation # [id number] [name], DMD 06/18/2018 10:06 AM

## 2018-06-18 NOTE — Anesthesia Preprocedure Evaluation (Addendum)
Anesthesia Evaluation  Patient identified by MRN, date of birth, ID band Patient awake    Reviewed: Allergy & Precautions, NPO status , Patient's Chart, lab work & pertinent test results  History of Anesthesia Complications Negative for: history of anesthetic complications  Airway Mallampati: II  TM Distance: >3 FB Neck ROM: Full    Dental  (+) Dental Advisory Given, Poor Dentition, Chipped, Missing   Pulmonary Current Smoker,    breath sounds clear to auscultation       Cardiovascular hypertension,  Rhythm:Regular Rate:Normal   Pulmonary hypertension  '19 TTE -  EF 60% to 65%. Mild MR. Mildly dilated LA. Moderate TR. PASP 56 mmHg    Neuro/Psych  Headaches, Anxiety  PTSD Neuromuscular disease (Peripheral neuropathy)    GI/Hepatic GERD  Medicated and Controlled,(+) Cirrhosis       ,  [facility]    Endo/Other  Hypothyroidism   Renal/GU negative Renal ROS  negative genitourinary   Musculoskeletal negative musculoskeletal ROS (+)   Abdominal   Peds  Hematology  (+) anemia ,  Pancytopenia    Anesthesia Other Findings Drug-seeking behavior   Reproductive/Obstetrics                            Anesthesia Physical Anesthesia Plan  ASA: III  Anesthesia Plan: General   Post-op Pain Management:    Induction: Intravenous  PONV Risk Score and Plan: 3 and Treatment may vary due to age or medical condition, Ondansetron and Midazolam  Airway Management Planned: Nasal ETT  Additional Equipment: None  Intra-op Plan:   Post-operative Plan: Extubation in OR  Informed Consent: I have reviewed the patients History and Physical, chart, labs and discussed the procedure including the risks, benefits and alternatives for the proposed anesthesia with the patient or authorized representative who has indicated his/her understanding and acceptance.   Dental advisory given  Plan Discussed with: CRNA  and Anesthesiologist  Anesthesia Plan Comments:        Anesthesia Quick Evaluation

## 2018-06-19 ENCOUNTER — Encounter (HOSPITAL_COMMUNITY): Payer: Self-pay | Admitting: Oral Surgery

## 2018-06-22 ENCOUNTER — Encounter: Payer: Self-pay | Admitting: Hematology & Oncology

## 2018-07-03 ENCOUNTER — Other Ambulatory Visit: Payer: Self-pay

## 2018-07-03 ENCOUNTER — Emergency Department (HOSPITAL_COMMUNITY): Payer: Medicaid Other

## 2018-07-03 ENCOUNTER — Encounter (HOSPITAL_COMMUNITY): Payer: Self-pay

## 2018-07-03 ENCOUNTER — Inpatient Hospital Stay (HOSPITAL_COMMUNITY)
Admission: EM | Admit: 2018-07-03 | Discharge: 2018-07-04 | DRG: 641 | Discharge status: Against Medical Advice | Payer: Medicaid Other | Attending: Internal Medicine | Admitting: Internal Medicine

## 2018-07-03 DIAGNOSIS — Z800 Family history of malignant neoplasm of digestive organs: Secondary | ICD-10-CM

## 2018-07-03 DIAGNOSIS — E039 Hypothyroidism, unspecified: Secondary | ICD-10-CM | POA: Diagnosis present

## 2018-07-03 DIAGNOSIS — K219 Gastro-esophageal reflux disease without esophagitis: Secondary | ICD-10-CM | POA: Diagnosis present

## 2018-07-03 DIAGNOSIS — K7030 Alcoholic cirrhosis of liver without ascites: Secondary | ICD-10-CM | POA: Diagnosis present

## 2018-07-03 DIAGNOSIS — Z8619 Personal history of other infectious and parasitic diseases: Secondary | ICD-10-CM | POA: Diagnosis not present

## 2018-07-03 DIAGNOSIS — Z79891 Long term (current) use of opiate analgesic: Secondary | ICD-10-CM

## 2018-07-03 DIAGNOSIS — E876 Hypokalemia: Secondary | ICD-10-CM | POA: Diagnosis present

## 2018-07-03 DIAGNOSIS — Z8249 Family history of ischemic heart disease and other diseases of the circulatory system: Secondary | ICD-10-CM

## 2018-07-03 DIAGNOSIS — Z9081 Acquired absence of spleen: Secondary | ICD-10-CM

## 2018-07-03 DIAGNOSIS — Z888 Allergy status to other drugs, medicaments and biological substances status: Secondary | ICD-10-CM

## 2018-07-03 DIAGNOSIS — Z79890 Hormone replacement therapy (postmenopausal): Secondary | ICD-10-CM

## 2018-07-03 DIAGNOSIS — K529 Noninfective gastroenteritis and colitis, unspecified: Secondary | ICD-10-CM

## 2018-07-03 DIAGNOSIS — Z803 Family history of malignant neoplasm of breast: Secondary | ICD-10-CM

## 2018-07-03 DIAGNOSIS — Z885 Allergy status to narcotic agent status: Secondary | ICD-10-CM | POA: Diagnosis not present

## 2018-07-03 DIAGNOSIS — Z5321 Procedure and treatment not carried out due to patient leaving prior to being seen by health care provider: Secondary | ICD-10-CM | POA: Diagnosis present

## 2018-07-03 DIAGNOSIS — A0471 Enterocolitis due to Clostridium difficile, recurrent: Secondary | ICD-10-CM | POA: Diagnosis present

## 2018-07-03 DIAGNOSIS — Z8349 Family history of other endocrine, nutritional and metabolic diseases: Secondary | ICD-10-CM

## 2018-07-03 DIAGNOSIS — G8929 Other chronic pain: Secondary | ICD-10-CM | POA: Diagnosis present

## 2018-07-03 DIAGNOSIS — F419 Anxiety disorder, unspecified: Secondary | ICD-10-CM | POA: Diagnosis present

## 2018-07-03 DIAGNOSIS — E739 Lactose intolerance, unspecified: Secondary | ICD-10-CM | POA: Diagnosis present

## 2018-07-03 DIAGNOSIS — D638 Anemia in other chronic diseases classified elsewhere: Secondary | ICD-10-CM | POA: Diagnosis present

## 2018-07-03 DIAGNOSIS — K859 Acute pancreatitis without necrosis or infection, unspecified: Secondary | ICD-10-CM

## 2018-07-03 DIAGNOSIS — Z79899 Other long term (current) drug therapy: Secondary | ICD-10-CM | POA: Diagnosis not present

## 2018-07-03 DIAGNOSIS — I10 Essential (primary) hypertension: Secondary | ICD-10-CM | POA: Diagnosis present

## 2018-07-03 DIAGNOSIS — R109 Unspecified abdominal pain: Secondary | ICD-10-CM

## 2018-07-03 DIAGNOSIS — Z9049 Acquired absence of other specified parts of digestive tract: Secondary | ICD-10-CM | POA: Diagnosis not present

## 2018-07-03 DIAGNOSIS — R197 Diarrhea, unspecified: Secondary | ICD-10-CM

## 2018-07-03 DIAGNOSIS — F17210 Nicotine dependence, cigarettes, uncomplicated: Secondary | ICD-10-CM | POA: Diagnosis present

## 2018-07-03 DIAGNOSIS — R112 Nausea with vomiting, unspecified: Secondary | ICD-10-CM

## 2018-07-03 LAB — COMPREHENSIVE METABOLIC PANEL
ALBUMIN: 3.3 g/dL — AB (ref 3.5–5.0)
ALK PHOS: 195 U/L — AB (ref 38–126)
ALT: 26 U/L (ref 0–44)
ANION GAP: 16 — AB (ref 5–15)
AST: 65 U/L — ABNORMAL HIGH (ref 15–41)
BILIRUBIN TOTAL: 3.2 mg/dL — AB (ref 0.3–1.2)
BUN: <5 mg/dL — ABNORMAL LOW (ref 6–20)
CO2: 16 mmol/L — AB (ref 22–32)
Calcium: 9 mg/dL (ref 8.9–10.3)
Chloride: 102 mmol/L (ref 98–111)
Creatinine, Ser: 1.39 mg/dL — ABNORMAL HIGH (ref 0.44–1.00)
GFR calc non Af Amer: 44 mL/min — ABNORMAL LOW (ref >60)
GFR, EST AFRICAN AMERICAN: 51 mL/min — AB (ref >60)
GLUCOSE: 120 mg/dL — AB (ref 70–99)
Potassium: 2.9 mmol/L — ABNORMAL LOW (ref 3.5–5.1)
SODIUM: 134 mmol/L — AB (ref 135–145)
TOTAL PROTEIN: 8.2 g/dL — AB (ref 6.5–8.1)

## 2018-07-03 LAB — CBC
HCT: 32.8 % — ABNORMAL LOW (ref 36.0–46.0)
HEMOGLOBIN: 10.6 g/dL — AB (ref 12.0–15.0)
MCH: 35 pg — AB (ref 26.0–34.0)
MCHC: 32.3 g/dL (ref 30.0–36.0)
MCV: 108.3 fL — ABNORMAL HIGH (ref 78.0–100.0)
Platelets: 67 10*3/uL — ABNORMAL LOW (ref 150–400)
RBC: 3.03 MIL/uL — AB (ref 3.87–5.11)
RDW: 16.5 % — ABNORMAL HIGH (ref 11.5–15.5)
WBC: 6.8 10*3/uL (ref 4.0–10.5)

## 2018-07-03 LAB — LIPASE, BLOOD: Lipase: 129 U/L — ABNORMAL HIGH (ref 11–51)

## 2018-07-03 LAB — MAGNESIUM: Magnesium: 1.7 mg/dL (ref 1.7–2.4)

## 2018-07-03 LAB — I-STAT CG4 LACTIC ACID, ED: LACTIC ACID, VENOUS: 3.18 mmol/L — AB (ref 0.5–1.9)

## 2018-07-03 MED ORDER — MELATONIN 3 MG PO TABS
9.0000 mg | ORAL_TABLET | Freq: Every evening | ORAL | Status: DC | PRN
  Administered 2018-07-04: 9 mg via ORAL
  Filled 2018-07-03 (×2): qty 3

## 2018-07-03 MED ORDER — ONDANSETRON HCL 4 MG PO TABS: 4.0000 mg | ORAL_TABLET | Freq: Four times a day (QID) | ORAL | Status: DC | PRN

## 2018-07-03 MED ORDER — ONDANSETRON HCL 4 MG/2ML IJ SOLN: 4.0000 mg | Freq: Four times a day (QID) | INTRAMUSCULAR | Status: DC | PRN

## 2018-07-03 MED ORDER — SERTRALINE HCL 50 MG PO TABS: 50.0000 mg | ORAL_TABLET | Freq: Every day | ORAL | Status: DC

## 2018-07-03 MED ORDER — LACTATED RINGERS IV BOLUS
1000.0000 mL | Freq: Once | INTRAVENOUS | Status: AC
  Administered 2018-07-03: 1000 mL via INTRAVENOUS

## 2018-07-03 MED ORDER — ONDANSETRON 4 MG PO TBDP
4.0000 mg | ORAL_TABLET | Freq: Once | ORAL | Status: AC | PRN
  Administered 2018-07-03: 4 mg via ORAL
  Filled 2018-07-03: qty 1

## 2018-07-03 MED ORDER — POTASSIUM CHLORIDE ER 8 MEQ PO TBCR
8.0000 meq | EXTENDED_RELEASE_TABLET | Freq: Every day | ORAL | Status: DC | PRN
  Filled 2018-07-03: qty 1

## 2018-07-03 MED ORDER — LACTATED RINGERS IV SOLN
INTRAVENOUS | Status: DC
  Administered 2018-07-03: 23:00:00 via INTRAVENOUS

## 2018-07-03 MED ORDER — VITAMIN B-1 100 MG PO TABS: 100.0000 mg | ORAL_TABLET | Freq: Every day | ORAL | Status: DC

## 2018-07-03 MED ORDER — VITAMIN B-12 1000 MCG PO TABS: 1000.0000 ug | ORAL_TABLET | ORAL | Status: DC

## 2018-07-03 MED ORDER — HEPARIN SODIUM (PORCINE) 5000 UNIT/ML IJ SOLN
5000.0000 [IU] | Freq: Three times a day (TID) | INTRAMUSCULAR | Status: DC
  Administered 2018-07-03 – 2018-07-04 (×2): 5000 [IU] via SUBCUTANEOUS
  Filled 2018-07-03 (×2): qty 1

## 2018-07-03 MED ORDER — POTASSIUM CHLORIDE 10 MEQ/100ML IV SOLN
10.0000 meq | INTRAVENOUS | Status: AC
  Administered 2018-07-03 (×3): 10 meq via INTRAVENOUS
  Filled 2018-07-03 (×3): qty 100

## 2018-07-03 MED ORDER — PANCRELIPASE (LIP-PROT-AMYL) 12000-38000 UNITS PO CPEP: 36000.0000 [IU] | ORAL_CAPSULE | Freq: Three times a day (TID) | ORAL | Status: DC

## 2018-07-03 MED ORDER — FENTANYL CITRATE (PF) 100 MCG/2ML IJ SOLN
50.0000 ug | Freq: Once | INTRAMUSCULAR | Status: AC
  Administered 2018-07-03: 50 ug via INTRAVENOUS
  Filled 2018-07-03: qty 2

## 2018-07-03 MED ORDER — TRAMADOL HCL 50 MG PO TABS
50.0000 mg | ORAL_TABLET | Freq: Two times a day (BID) | ORAL | Status: DC | PRN
  Administered 2018-07-03: 50 mg via ORAL
  Filled 2018-07-03: qty 1

## 2018-07-03 MED ORDER — PANTOPRAZOLE SODIUM 40 MG PO TBEC: 40.0000 mg | DELAYED_RELEASE_TABLET | Freq: Every day | ORAL | Status: DC

## 2018-07-03 MED ORDER — FOLIC ACID 1 MG PO TABS: 1.0000 mg | ORAL_TABLET | Freq: Every day | ORAL | Status: DC

## 2018-07-03 MED ORDER — PREGABALIN 50 MG PO CAPS
50.0000 mg | ORAL_CAPSULE | Freq: Three times a day (TID) | ORAL | Status: DC
  Administered 2018-07-03: 50 mg via ORAL
  Filled 2018-07-03: qty 1

## 2018-07-03 NOTE — ED Notes (Signed)
Patient transported to CT 
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
## 2018-07-03 NOTE — ED Triage Notes (Signed)
Onset 06-28-18 diarrhea, abd pain, and fever.  Recent teeth extraction 06-18-18.  Pt has not been eating and drinking very little this past week.

## 2018-07-03 NOTE — ED Notes (Signed)
Dr Ronnald Nian informed of lactic acid 3.18

## 2018-07-04 LAB — CBC
HEMATOCRIT: 29.9 % — AB (ref 36.0–46.0)
Hemoglobin: 9.9 g/dL — ABNORMAL LOW (ref 12.0–15.0)
MCH: 35.1 pg — ABNORMAL HIGH (ref 26.0–34.0)
MCHC: 33.1 g/dL (ref 30.0–36.0)
MCV: 106 fL — AB (ref 78.0–100.0)
PLATELETS: 68 10*3/uL — AB (ref 150–400)
RBC: 2.82 MIL/uL — ABNORMAL LOW (ref 3.87–5.11)
RDW: 16.4 % — AB (ref 11.5–15.5)
WBC: 5.7 10*3/uL (ref 4.0–10.5)

## 2018-07-04 LAB — COMPREHENSIVE METABOLIC PANEL
ALBUMIN: 3.1 g/dL — AB (ref 3.5–5.0)
ALT: 26 U/L (ref 0–44)
AST: 69 U/L — AB (ref 15–41)
Alkaline Phosphatase: 164 U/L — ABNORMAL HIGH (ref 38–126)
Anion gap: 12 (ref 5–15)
BUN: 7 mg/dL (ref 6–20)
CHLORIDE: 106 mmol/L (ref 98–111)
CO2: 17 mmol/L — AB (ref 22–32)
CREATININE: 1.26 mg/dL — AB (ref 0.44–1.00)
Calcium: 8.7 mg/dL — ABNORMAL LOW (ref 8.9–10.3)
GFR calc Af Amer: 58 mL/min — ABNORMAL LOW (ref >60)
GFR, EST NON AFRICAN AMERICAN: 50 mL/min — AB (ref >60)
Glucose, Bld: 119 mg/dL — ABNORMAL HIGH (ref 70–99)
POTASSIUM: 3.7 mmol/L (ref 3.5–5.1)
Sodium: 135 mmol/L (ref 135–145)
Total Bilirubin: 3.5 mg/dL — ABNORMAL HIGH (ref 0.3–1.2)
Total Protein: 7.2 g/dL (ref 6.5–8.1)

## 2018-07-04 LAB — URINALYSIS, ROUTINE W REFLEX MICROSCOPIC
BILIRUBIN URINE: NEGATIVE
GLUCOSE, UA: NEGATIVE mg/dL
HGB URINE DIPSTICK: NEGATIVE
Ketones, ur: NEGATIVE mg/dL
Leukocytes, UA: NEGATIVE
Nitrite: NEGATIVE
PH: 5 (ref 5.0–8.0)
Protein, ur: NEGATIVE mg/dL
SPECIFIC GRAVITY, URINE: 1.008 (ref 1.005–1.030)

## 2018-07-04 LAB — C DIFFICILE QUICK SCREEN W PCR REFLEX
C Diff antigen: POSITIVE — AB
C Diff toxin: NEGATIVE

## 2018-07-04 LAB — MAGNESIUM: Magnesium: 1.6 mg/dL — ABNORMAL LOW (ref 1.7–2.4)

## 2018-07-04 MED ORDER — HYOSCYAMINE SULFATE 0.125 MG SL SUBL
0.2500 mg | SUBLINGUAL_TABLET | Freq: Four times a day (QID) | SUBLINGUAL | Status: DC | PRN
  Administered 2018-07-04: 0.25 mg via SUBLINGUAL
  Filled 2018-07-04 (×3): qty 2
  Filled 2018-07-04: qty 1

## 2018-07-04 MED ORDER — HYOSCYAMINE SULFATE 0.5 MG/ML IJ SOLN: 0.5000 mg | Freq: Four times a day (QID) | INTRAMUSCULAR | Status: DC | PRN

## 2018-07-04 MED ORDER — POTASSIUM CHLORIDE 10 MEQ/100ML IV SOLN
10.0000 meq | Freq: Once | INTRAVENOUS | Status: AC
  Administered 2018-07-04: 10 meq via INTRAVENOUS
  Filled 2018-07-04: qty 100

## 2018-07-04 MED ORDER — MAGNESIUM SULFATE 2 GM/50ML IV SOLN
2.0000 g | Freq: Once | INTRAVENOUS | Status: DC
  Filled 2018-07-04: qty 50

## 2018-07-04 MED ORDER — SODIUM CHLORIDE 0.9 % IV SOLN: INTRAVENOUS | Status: DC

## 2018-07-04 NOTE — Progress Notes (Signed)
Pt left AMA, explained to pt the risks of leaving against medical advice. Pt signed the form and left. Dr. Clementeen Graham notified.

## 2018-07-05 ENCOUNTER — Other Ambulatory Visit: Payer: Self-pay

## 2018-07-05 ENCOUNTER — Other Ambulatory Visit: Payer: Self-pay | Admitting: Internal Medicine

## 2018-07-05 ENCOUNTER — Emergency Department (HOSPITAL_COMMUNITY)
Admission: EM | Admit: 2018-07-05 | Discharge: 2018-07-05 | Disposition: A | Discharge status: Home / Self Care | Payer: Medicaid Other | Attending: Emergency Medicine | Admitting: Emergency Medicine

## 2018-07-05 ENCOUNTER — Encounter (HOSPITAL_COMMUNITY): Payer: Self-pay

## 2018-07-05 DIAGNOSIS — Z79899 Other long term (current) drug therapy: Secondary | ICD-10-CM | POA: Diagnosis not present

## 2018-07-05 DIAGNOSIS — G8929 Other chronic pain: Secondary | ICD-10-CM

## 2018-07-05 DIAGNOSIS — E039 Hypothyroidism, unspecified: Secondary | ICD-10-CM | POA: Insufficient documentation

## 2018-07-05 DIAGNOSIS — R1013 Epigastric pain: Secondary | ICD-10-CM | POA: Diagnosis present

## 2018-07-05 DIAGNOSIS — R109 Unspecified abdominal pain: Principal | ICD-10-CM

## 2018-07-05 DIAGNOSIS — F17210 Nicotine dependence, cigarettes, uncomplicated: Secondary | ICD-10-CM | POA: Insufficient documentation

## 2018-07-05 DIAGNOSIS — R197 Diarrhea, unspecified: Secondary | ICD-10-CM

## 2018-07-05 DIAGNOSIS — R112 Nausea with vomiting, unspecified: Secondary | ICD-10-CM

## 2018-07-05 DIAGNOSIS — I10 Essential (primary) hypertension: Secondary | ICD-10-CM | POA: Insufficient documentation

## 2018-07-05 LAB — COMPREHENSIVE METABOLIC PANEL
ALK PHOS: 179 U/L — AB (ref 38–126)
ALT: 32 U/L (ref 0–44)
AST: 94 U/L — ABNORMAL HIGH (ref 15–41)
Albumin: 3.5 g/dL (ref 3.5–5.0)
Anion gap: 11 (ref 5–15)
BUN: 11 mg/dL (ref 6–20)
CALCIUM: 9.3 mg/dL (ref 8.9–10.3)
CO2: 20 mmol/L — ABNORMAL LOW (ref 22–32)
Chloride: 105 mmol/L (ref 98–111)
Creatinine, Ser: 1.16 mg/dL — ABNORMAL HIGH (ref 0.44–1.00)
GFR calc Af Amer: >60 mL/min (ref >60)
GFR, EST NON AFRICAN AMERICAN: 55 mL/min — AB (ref >60)
Glucose, Bld: 111 mg/dL — ABNORMAL HIGH (ref 70–99)
Potassium: 3.7 mmol/L (ref 3.5–5.1)
Sodium: 136 mmol/L (ref 135–145)
Total Bilirubin: 3.7 mg/dL — ABNORMAL HIGH (ref 0.3–1.2)
Total Protein: 7.9 g/dL (ref 6.5–8.1)

## 2018-07-05 LAB — RAPID URINE DRUG SCREEN, HOSP PERFORMED
Amphetamines: NOT DETECTED
BENZODIAZEPINES: NOT DETECTED
Barbiturates: NOT DETECTED
COCAINE: NOT DETECTED
Opiates: NOT DETECTED
Tetrahydrocannabinol: NOT DETECTED

## 2018-07-05 LAB — CBC
HCT: 29.5 % — ABNORMAL LOW (ref 36.0–46.0)
Hemoglobin: 10.3 g/dL — ABNORMAL LOW (ref 12.0–15.0)
MCH: 34.8 pg — AB (ref 26.0–34.0)
MCHC: 34.9 g/dL (ref 30.0–36.0)
MCV: 99.7 fL (ref 78.0–100.0)
Platelets: 69 10*3/uL — ABNORMAL LOW (ref 150–400)
RBC: 2.96 MIL/uL — ABNORMAL LOW (ref 3.87–5.11)
RDW: 16.4 % — AB (ref 11.5–15.5)
WBC: 4.6 10*3/uL (ref 4.0–10.5)

## 2018-07-05 LAB — URINALYSIS, ROUTINE W REFLEX MICROSCOPIC
Bilirubin Urine: NEGATIVE
Glucose, UA: NEGATIVE mg/dL
Hgb urine dipstick: NEGATIVE
Ketones, ur: NEGATIVE mg/dL
Nitrite: NEGATIVE
PH: 5 (ref 5.0–8.0)
Protein, ur: NEGATIVE mg/dL
SPECIFIC GRAVITY, URINE: 1.006 (ref 1.005–1.030)

## 2018-07-05 LAB — I-STAT BETA HCG BLOOD, ED (MC, WL, AP ONLY): I-stat hCG, quantitative: <5 m[IU]/mL (ref <5)

## 2018-07-05 LAB — LIPASE, BLOOD: Lipase: 29 U/L (ref 11–51)

## 2018-07-05 MED ORDER — ONDANSETRON 4 MG PO TBDP
4.0000 mg | ORAL_TABLET | Freq: Once | ORAL | Status: AC | PRN
  Administered 2018-07-05: 4 mg via ORAL
  Filled 2018-07-05: qty 1

## 2018-07-05 MED ORDER — HYDROCODONE-ACETAMINOPHEN 5-325 MG PO TABS
1.0000 | ORAL_TABLET | Freq: Once | ORAL | Status: AC
  Administered 2018-07-05: 1 via ORAL
  Filled 2018-07-05: qty 1

## 2018-07-05 MED ORDER — FAMOTIDINE 20 MG PO TABS: 20.0000 mg | ORAL_TABLET | Freq: Every day | ORAL | 0 refills | Status: DC

## 2018-07-05 MED ORDER — DICYCLOMINE HCL 10 MG/ML IM SOLN
20.0000 mg | Freq: Once | INTRAMUSCULAR | Status: AC
  Administered 2018-07-05: 20 mg via INTRAMUSCULAR
  Filled 2018-07-05: qty 2

## 2018-07-05 MED ORDER — SUCRALFATE 1 GM/10ML PO SUSP: 1.0000 g | Freq: Three times a day (TID) | ORAL | 0 refills | Status: DC

## 2018-07-05 MED ORDER — ONDANSETRON 4 MG PO TBDP: 4.0000 mg | ORAL_TABLET | Freq: Three times a day (TID) | ORAL | 0 refills | Status: DC | PRN

## 2018-07-05 MED ORDER — SODIUM CHLORIDE 0.9 % IV BOLUS
500.0000 mL | Freq: Once | INTRAVENOUS | Status: AC
  Administered 2018-07-05: 500 mL via INTRAVENOUS

## 2018-07-05 MED ORDER — METOCLOPRAMIDE HCL 10 MG PO TABS: 10.0000 mg | ORAL_TABLET | Freq: Three times a day (TID) | ORAL | 0 refills | Status: DC | PRN

## 2018-07-05 MED ORDER — FAMOTIDINE IN NACL 20-0.9 MG/50ML-% IV SOLN
20.0000 mg | Freq: Once | INTRAVENOUS | Status: AC
  Administered 2018-07-05: 20 mg via INTRAVENOUS
  Filled 2018-07-05: qty 50

## 2018-07-05 MED ORDER — METOCLOPRAMIDE HCL 5 MG/ML IJ SOLN
10.0000 mg | Freq: Once | INTRAMUSCULAR | Status: AC
  Administered 2018-07-05: 10 mg via INTRAVENOUS
  Filled 2018-07-05: qty 2

## 2018-07-05 MED ORDER — DICYCLOMINE HCL 20 MG PO TABS: 20.0000 mg | ORAL_TABLET | Freq: Once | ORAL | Status: DC
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
## 2018-07-05 NOTE — ED Triage Notes (Signed)
Patient c/o mid abdominal pain x 1 week. patient states she went to [facility] ED 2 days ago. Patient states "I am much worse today." patient states she has been having V/D.

## 2018-07-05 NOTE — Discharge Instructions (Signed)
Continue taking home medications as prescribed. Take Pepcid instead of Nexium. Use Zofran as needed for nausea or vomiting.  Use Carafate as needed for breakthrough pain.  Use Reglan as needed for severe or continued abdominal pain. Stick with a liquid diet until your symptoms resolve.  As they are improving, you may try bland, basic foods very small quantities. Call your stomach doctors tomorrow to schedule follow-up appointment. Return to the emergency room if you develop fevers, persistent vomiting, severe worsening pain, or any new or concerning symptoms.

## 2018-07-06 NOTE — Discharge Summary (Signed)
Physician Discharge Summary  Jamie Allen YSA:[id number] DOB: [birth year] DOA: 07/03/2018  PCP: Ladell Pier, MD  Admit date: 07/03/2018 Discharge date: 07/06/2018  Admitted From: Home Disposition: Left AGAINST MEDICAL ADVICE   Discharge Diagnoses:  Principal Problem:   Hypokalemia  Active Problems:   Hypothyroidism   GERD (gastroesophageal reflux disease)   Chronic abdominal pain   Recurrent Clostridium difficile diarrhea   Hypertension   Alcoholic cirrhosis of liver (HCC)    Brief/Interim Summary: Please refer to admission H&P for details, 47 y.o. female with medical history significant of liver cirrhosis, alcoholism with no active drinking, previous C. difficile colitis, hypothyroidism, anxiety disorder and chronic abdominal pain with previous splenectomy who presents with sudden onset of nausea episode of vomiting but significant diarrhea and abdominal pain.  Symptoms have been going on for 3 days now.  Pain is rated as 5 out of 10 in the right flanks going to her epigastric region.  It is persistent not worsened by anything or relieved by any medications.  Patient has had previous gastritis and took medications but did not get any relief.  In the ER she was evaluated and no actual source was found.  Suspected possible pancreatitis.  Work-up shows significant hypokalemia however associated with her symptoms so she is being admitted for treatment..  ED Course: Temperature is 98 with other vital signs being normal.  White count is 6.8 with hemoglobin 10.6 platelet is 67.  Sodium 134 potassium 2.9 chloride 102 CO2 16 BUN less than 5 and creatinine 1.39.  Lactic acid is 3.18 and glucose 120.  CT abdomen and pelvis showed Subtle mesenteric fat stranding inferior/posterior to the pancreatic head and adjacent the proximal small bowel with mild associated small bowel wall thickening. Findings may be due to a regional enteritis of infectious or inflammatory nature. Pancreatitis is  less likely.   Hospital course Patient monitored for abdominal pain likely due to enteritis.  Stool for C. difficile was sent which was negative.  Low potassium was replenished.  Patient was monitored on IV fluids. Next morning patient told the nurse he felt better and wanted to go home.  Hospital by the nurse that patient left AGAINST MEDICAL ADVICE before I had the chance to see and examine her.    Discharge Instructions   Allergies as of 07/04/2018      Reactions   Iohexol Hives, Itching, Swelling   Lactose Intolerance (gi) Diarrhea, Nausea Only   Other Other (See Comments)   Spicy foods "tear up my stomach"   Vancomycin Itching   Nose itching   Morphine And Related Itching, Other (See Comments)   Can tolerate with Benadryl      Medication List    ASK your doctor about these medications   esomeprazole 20 MG capsule Commonly known as:  NEXIUM Take 20 mg by mouth daily.   lipase/protease/amylase 36000 UNITS Cpep capsule Commonly known as:  CREON Take 1 capsule (36,000 Units total) by mouth 3 (three) times daily before meals.   Melatonin 10 MG Tabs Take 10 mg by mouth at bedtime as needed (for sleep).   ondansetron 4 MG tablet Commonly known as:  ZOFRAN Take 1 tablet (4 mg total) by mouth every 6 (six) hours as needed for nausea.   oxyCODONE-acetaminophen 5-325 MG tablet Commonly known as:  PERCOCET/ROXICET Take 1 tablet by mouth every 4 (four) hours as needed.   potassium chloride 8 MEQ tablet Commonly known as:  KLOR-CON Take 2 tablets (16 mEq total) by mouth  daily.   pregabalin 50 MG capsule Commonly known as:  LYRICA Take 1 capsule (50 mg total) by mouth 3 (three) times daily.   sertraline 50 MG tablet Commonly known as:  ZOLOFT Take 1 tablet (50 mg total) by mouth daily.   traMADol 50 MG tablet Commonly known as:  ULTRAM Take 1 tablet (50 mg total) by mouth 2 (two) times daily as needed. To fill on or after 06/24/2018   vitamin B-12 1000 MCG  tablet Commonly known as:  CYANOCOBALAMIN Take 1,000 mcg by mouth every other day.       Allergies  Allergen Reactions  . Iohexol Hives, Itching and Swelling  . Lactose Intolerance (Gi) Diarrhea and Nausea Only  . Other Other (See Comments)    Spicy foods "tear up my stomach"  . Vancomycin Itching    Nose itching  . Morphine And Related Itching and Other (See Comments)    Can tolerate with Benadryl     Procedures/Studies: Ct Abdomen Pelvis Wo Contrast  Result Date: 07/03/2018 CLINICAL DATA:  Abdominal pain with diarrhea and fever beginning 5 days ago. EXAM: CT ABDOMEN AND PELVIS WITHOUT CONTRAST TECHNIQUE: Multidetector CT imaging of the abdomen and pelvis was performed following the standard protocol without IV contrast. COMPARISON:  01/22/2018 FINDINGS: Lower chest: Lung bases are within normal. Hepatobiliary: Previous cholecystectomy. Mild nodularity to the surface of the left lobe of the liver. No focal liver mass. Biliary tree is normal. Pancreas: Normal. Spleen: Splenomegaly. Adrenals/Urinary Tract: Adrenal glands are normal. Right kidney is normal. Mild left renal atrophy unchanged. No hydronephrosis or nephrolithiasis. Ureters and bladder are normal. Stomach/Bowel: Stomach and small bowel are normal. Appendix is normal. Colon is unremarkable. Vascular/Lymphatic: Minimal calcified plaque over the abdominal aorta. Few small lymph nodes versus varices in the region of the gastrohepatic ligament. Otherwise, no definite adenopathy. Reproductive: Within normal. Other: Mild fat stranding just inferior/posterior to the head of the pancreas and adjacent the distal duodenum and proximal jejunum in the mid abdomen of uncertain clinical significance. Possible mild wall thickening of several proximal jejunal loops. Findings may be due to a regional enteritis of infectious or inflammatory nature pancreatitis is possible. Musculoskeletal: Unremarkable. IMPRESSION: Subtle mesenteric fat stranding  inferior/posterior to the pancreatic head and adjacent the proximal small bowel with mild associated small bowel wall thickening. Findings may be due to a regional enteritis of infectious or inflammatory nature. Pancreatitis is less likely. Findings suggesting mild cirrhosis with stable splenomegaly. Mild left renal atrophy. Aortic Atherosclerosis (ICD10-I70.0). Electronically Signed   By: Marin Olp M.D.   On: 07/03/2018 20:18   US Abdomen Complete  Result Date: 06/16/2018 CLINICAL DATA:  Cirrhosis and splenomegaly.  Pancytopenia. EXAM: ABDOMEN ULTRASOUND COMPLETE COMPARISON:  03/19/2018 FINDINGS: Gallbladder: Surgically absent. Common bile duct: Diameter: 0.7 cm Liver: Nodular liver with internal heterogeneity compatible with cirrhosis. No focal mass is identified. Portal vein is patent on color Doppler imaging with normal direction of blood flow towards the liver. IVC: No abnormality visualized. Pancreas: Visualized portion unremarkable. Distal pancreatic tail not well seen due to overlying bowel gas. Spleen: Splenic volume calculated at 1447 cubic cm, formerly reported at 1325 cm. Right Kidney: Length: 11.8 cm. Echogenicity within normal limits. No mass or hydronephrosis visualized. Left Kidney: Length: 9.6 cm. Echogenicity within normal limits. No mass or hydronephrosis visualized. Notable scarring. Abdominal aorta: No aneurysm visualized. Other findings: None. IMPRESSION: 1. Similar appearance of sonographic findings of hepatic cirrhosis, with splenic volume minimally increased from prior at 1447 cubic cm, previously 1325  cm. 2. Chronic scarring of the left kidney upper pole. Electronically Signed   By: Van Clines M.D.   On: 06/16/2018 11:58    (Echo, Carotid, EGD, Colonoscopy, ERCP)    Subjective:   Discharge Exam: Vitals:   07/03/18 2154 07/04/18 0456  BP: (!) 133/48 117/64  Pulse: 63 79  Resp: 15 18  Temp: 97.9 F (36.6 C) 98.2 F (36.8 C)  SpO2: 99% 98%   Vitals:    07/03/18 2015 07/03/18 2100 07/03/18 2154 07/04/18 0456  BP: (!) 128/53 123/60 (!) 133/48 117/64  Pulse: 62 70 63 79  Resp:   15 18  Temp:   97.9 F (36.6 C) 98.2 F (36.8 C)  TempSrc:   Oral Oral  SpO2: 99% 98% 99% 98%    Patient left AMA before being examined.    The results of significant diagnostics from this hospitalization (including imaging, microbiology, ancillary and laboratory) are listed below for reference.     Microbiology: Recent Results (from the past 240 hour(s))  C difficile quick scan w PCR reflex     Status: Abnormal   Collection Time: 07/03/18 10:27 PM  Result Value Ref Range Status   C Diff antigen POSITIVE (A) NEGATIVE Final   C Diff toxin NEGATIVE NEGATIVE Final   C Diff interpretation Results are indeterminate. See PCR results.  Final    Comment: NEGATIVE Performed at [facility], [place] [address], [place], [place] [address]   C. Diff by PCR, Reflexed     Status: None   Collection Time: 07/03/18 10:27 PM  Result Value Ref Range Status   Toxigenic C. Difficile by PCR  NEGATIVE Final    Comment: Performed at [facility], [place] [address], [place], [place] [address]     Labs: BNP (last 3 results) No results for input(s): BNP in the last 8760 hours. Basic Metabolic Panel: Recent Labs  Lab 07/03/18 1629 07/03/18 1916 07/04/18 0059 07/05/18 1216  NA 134*  --  135 136  K 2.9*  --  3.7 3.7  CL 102  --  106 105  CO2 16*  --  17* 20*  GLUCOSE 120*  --  119* 111*  BUN <5*  --  7 11  CREATININE 1.39*  --  1.26* 1.16*  CALCIUM 9.0  --  8.7* 9.3  MG  --  1.7 1.6*  --    Liver Function Tests: Recent Labs  Lab 07/03/18 1629 07/04/18 0059 07/05/18 1216  AST 65* 69* 94*  ALT 26 26 32  ALKPHOS 195* 164* 179*  BILITOT 3.2* 3.5* 3.7*  PROT 8.2* 7.2 7.9  ALBUMIN 3.3* 3.1* 3.5   Recent Labs  Lab 07/03/18 1629 07/05/18 1216  LIPASE 129* 29   No results for input(s): AMMONIA in the last 168 hours. CBC: Recent Labs  Lab  07/03/18 1629 07/04/18 0059 07/05/18 1216  WBC 6.8 5.7 4.6  HGB 10.6* 9.9* 10.3*  HCT 32.8* 29.9* 29.5*  MCV 108.3* 106.0* 99.7  PLT 67* 68* 69*   Cardiac Enzymes: No results for input(s): CKTOTAL, CKMB, CKMBINDEX, TROPONINI in the last 168 hours. BNP: Invalid input(s): POCBNP CBG: No results for input(s): GLUCAP in the last 168 hours. D-Dimer No results for input(s): DDIMER in the last 72 hours. Hgb A1c No results for input(s): HGBA1C in the last 72 hours. Lipid Profile No results for input(s): CHOL, HDL, LDLCALC, TRIG, CHOLHDL, LDLDIRECT in the last 72 hours. Thyroid function studies No results for input(s): TSH, T4TOTAL,  T3FREE, THYROIDAB in the last 72 hours.  Invalid input(s): FREET3 Anemia work up No results for input(s): VITAMINB12, FOLATE, FERRITIN, TIBC, IRON, RETICCTPCT in the last 72 hours. Urinalysis    Component Value Date/Time   COLORURINE YELLOW 07/05/2018 1911   APPEARANCEUR CLEAR 07/05/2018 1911   APPEARANCEUR Clear 02/04/2018 1118   LABSPEC 1.006 07/05/2018 1911   PHURINE 5.0 07/05/2018 1911   GLUCOSEU NEGATIVE 07/05/2018 1911   HGBUR NEGATIVE 07/05/2018 1911   BILIRUBINUR NEGATIVE 07/05/2018 1911   BILIRUBINUR Negative 02/04/2018 1118   KETONESUR NEGATIVE 07/05/2018 1911   PROTEINUR NEGATIVE 07/05/2018 1911   UROBILINOGEN 0.2 05/19/2016 1638   UROBILINOGEN 0.2 08/17/2015 1853   NITRITE NEGATIVE 07/05/2018 1911   LEUKOCYTESUR TRACE (A) 07/05/2018 1911   LEUKOCYTESUR Negative 02/04/2018 1118   Sepsis Labs Invalid input(s): PROCALCITONIN,  WBC,  LACTICIDVEN Microbiology Recent Results (from the past 240 hour(s))  C difficile quick scan w PCR reflex     Status: Abnormal   Collection Time: 07/03/18 10:27 PM  Result Value Ref Range Status   C Diff antigen POSITIVE (A) NEGATIVE Final   C Diff toxin NEGATIVE NEGATIVE Final   C Diff interpretation Results are indeterminate. See PCR results.  Final    Comment: NEGATIVE Performed at [facility], [place] [address] [place], [place] [address]   C. Diff by PCR, Reflexed     Status: None   Collection Time: 07/03/18 10:27 PM  Result Value Ref Range Status   Toxigenic C. Difficile by PCR  NEGATIVE Final    Comment: Performed at [facility], [place] [address], [place], [place] [address]    Time spent: 10 minutes  SIGNED:   Louellen Molder, MD  [facility] 07/06/2018, 4:19 PM Pager   If 7PM-7AM, please contact night-coverage [email or web address] Password [email or web address]

## 2018-07-08 MED FILL — traMADol HCL 50 MG TABS: 50 | 7 days supply | Qty: 14 | Fill #0

## 2018-07-08 NOTE — Telephone Encounter (Signed)
Pt has her follow up appointment scheduled

## 2018-07-08 NOTE — Telephone Encounter (Signed)
yahir could you call pt and schedule her an appointment

## 2018-07-08 NOTE — Telephone Encounter (Signed)
Patient requesting refill on tramadol.  [place] controlled substance reporting system reviewed and is appropriate.  Refill given

## 2018-07-13 ENCOUNTER — Inpatient Hospital Stay (HOSPITAL_BASED_OUTPATIENT_CLINIC_OR_DEPARTMENT_OTHER)
Admission: EM | Admit: 2018-07-13 | Discharge: 2018-07-23 | DRG: 439 | Disposition: A | Discharge status: Home / Self Care | Payer: Medicaid Other | Attending: Internal Medicine | Admitting: Internal Medicine

## 2018-07-13 ENCOUNTER — Telehealth: Payer: Self-pay | Admitting: *Deleted

## 2018-07-13 ENCOUNTER — Encounter (HOSPITAL_BASED_OUTPATIENT_CLINIC_OR_DEPARTMENT_OTHER): Payer: Self-pay

## 2018-07-13 ENCOUNTER — Other Ambulatory Visit: Payer: Self-pay

## 2018-07-13 DIAGNOSIS — D849 Immunodeficiency, unspecified: Secondary | ICD-10-CM | POA: Diagnosis not present

## 2018-07-13 DIAGNOSIS — Z91041 Radiographic dye allergy status: Secondary | ICD-10-CM

## 2018-07-13 DIAGNOSIS — N289 Disorder of kidney and ureter, unspecified: Secondary | ICD-10-CM | POA: Diagnosis present

## 2018-07-13 DIAGNOSIS — I700 Atherosclerosis of aorta: Secondary | ICD-10-CM | POA: Diagnosis not present

## 2018-07-13 DIAGNOSIS — Z79899 Other long term (current) drug therapy: Secondary | ICD-10-CM

## 2018-07-13 DIAGNOSIS — Z8619 Personal history of other infectious and parasitic diseases: Secondary | ICD-10-CM | POA: Diagnosis present

## 2018-07-13 DIAGNOSIS — F1120 Opioid dependence, uncomplicated: Secondary | ICD-10-CM | POA: Diagnosis present

## 2018-07-13 DIAGNOSIS — K219 Gastro-esophageal reflux disease without esophagitis: Secondary | ICD-10-CM | POA: Diagnosis not present

## 2018-07-13 DIAGNOSIS — F1020 Alcohol dependence, uncomplicated: Secondary | ICD-10-CM | POA: Diagnosis not present

## 2018-07-13 DIAGNOSIS — K700 Alcoholic fatty liver: Secondary | ICD-10-CM | POA: Diagnosis not present

## 2018-07-13 DIAGNOSIS — F32A Depression, unspecified: Secondary | ICD-10-CM | POA: Diagnosis present

## 2018-07-13 DIAGNOSIS — E876 Hypokalemia: Secondary | ICD-10-CM | POA: Diagnosis present

## 2018-07-13 DIAGNOSIS — Z885 Allergy status to narcotic agent status: Secondary | ICD-10-CM

## 2018-07-13 DIAGNOSIS — R402364 Coma scale, best motor response, obeys commands, 24 hours or more after hospital admission: Secondary | ICD-10-CM | POA: Diagnosis not present

## 2018-07-13 DIAGNOSIS — K909 Intestinal malabsorption, unspecified: Secondary | ICD-10-CM | POA: Diagnosis not present

## 2018-07-13 DIAGNOSIS — I4581 Long QT syndrome: Secondary | ICD-10-CM | POA: Diagnosis not present

## 2018-07-13 DIAGNOSIS — K648 Other hemorrhoids: Secondary | ICD-10-CM | POA: Diagnosis present

## 2018-07-13 DIAGNOSIS — K8681 Exocrine pancreatic insufficiency: Secondary | ICD-10-CM | POA: Diagnosis present

## 2018-07-13 DIAGNOSIS — R161 Splenomegaly, not elsewhere classified: Secondary | ICD-10-CM | POA: Diagnosis present

## 2018-07-13 DIAGNOSIS — Z91410 Personal history of adult physical and sexual abuse: Secondary | ICD-10-CM

## 2018-07-13 DIAGNOSIS — K766 Portal hypertension: Secondary | ICD-10-CM | POA: Diagnosis present

## 2018-07-13 DIAGNOSIS — F419 Anxiety disorder, unspecified: Secondary | ICD-10-CM | POA: Diagnosis not present

## 2018-07-13 DIAGNOSIS — F329 Major depressive disorder, single episode, unspecified: Secondary | ICD-10-CM | POA: Diagnosis not present

## 2018-07-13 DIAGNOSIS — Z9049 Acquired absence of other specified parts of digestive tract: Secondary | ICD-10-CM

## 2018-07-13 DIAGNOSIS — D61818 Other pancytopenia: Secondary | ICD-10-CM | POA: Diagnosis present

## 2018-07-13 DIAGNOSIS — K8689 Other specified diseases of pancreas: Secondary | ICD-10-CM

## 2018-07-13 DIAGNOSIS — K7030 Alcoholic cirrhosis of liver without ascites: Secondary | ICD-10-CM | POA: Diagnosis not present

## 2018-07-13 DIAGNOSIS — Z86010 Personal history of colonic polyps: Secondary | ICD-10-CM

## 2018-07-13 DIAGNOSIS — F4310 Post-traumatic stress disorder, unspecified: Secondary | ICD-10-CM | POA: Diagnosis present

## 2018-07-13 DIAGNOSIS — E739 Lactose intolerance, unspecified: Secondary | ICD-10-CM | POA: Diagnosis present

## 2018-07-13 DIAGNOSIS — R509 Fever, unspecified: Secondary | ICD-10-CM | POA: Diagnosis present

## 2018-07-13 DIAGNOSIS — G629 Polyneuropathy, unspecified: Secondary | ICD-10-CM | POA: Diagnosis present

## 2018-07-13 DIAGNOSIS — Z8719 Personal history of other diseases of the digestive system: Secondary | ICD-10-CM

## 2018-07-13 DIAGNOSIS — R002 Palpitations: Secondary | ICD-10-CM | POA: Diagnosis present

## 2018-07-13 DIAGNOSIS — R109 Unspecified abdominal pain: Secondary | ICD-10-CM | POA: Diagnosis present

## 2018-07-13 DIAGNOSIS — G8929 Other chronic pain: Secondary | ICD-10-CM | POA: Diagnosis not present

## 2018-07-13 DIAGNOSIS — K861 Other chronic pancreatitis: Secondary | ICD-10-CM | POA: Diagnosis not present

## 2018-07-13 DIAGNOSIS — R112 Nausea with vomiting, unspecified: Secondary | ICD-10-CM | POA: Diagnosis present

## 2018-07-13 DIAGNOSIS — K7460 Unspecified cirrhosis of liver: Secondary | ICD-10-CM

## 2018-07-13 DIAGNOSIS — F17210 Nicotine dependence, cigarettes, uncomplicated: Secondary | ICD-10-CM | POA: Diagnosis not present

## 2018-07-13 DIAGNOSIS — E860 Dehydration: Secondary | ICD-10-CM | POA: Diagnosis not present

## 2018-07-13 DIAGNOSIS — R197 Diarrhea, unspecified: Secondary | ICD-10-CM | POA: Diagnosis present

## 2018-07-13 DIAGNOSIS — Z765 Malingerer [conscious simulation]: Secondary | ICD-10-CM

## 2018-07-13 DIAGNOSIS — R402144 Coma scale, eyes open, spontaneous, 24 hours or more after hospital admission: Secondary | ICD-10-CM | POA: Diagnosis not present

## 2018-07-13 DIAGNOSIS — Z881 Allergy status to other antibiotic agents status: Secondary | ICD-10-CM

## 2018-07-13 DIAGNOSIS — D638 Anemia in other chronic diseases classified elsewhere: Secondary | ICD-10-CM

## 2018-07-13 DIAGNOSIS — K859 Acute pancreatitis without necrosis or infection, unspecified: Secondary | ICD-10-CM | POA: Diagnosis not present

## 2018-07-13 DIAGNOSIS — D539 Nutritional anemia, unspecified: Secondary | ICD-10-CM | POA: Diagnosis present

## 2018-07-13 DIAGNOSIS — Z8639 Personal history of other endocrine, nutritional and metabolic disease: Secondary | ICD-10-CM

## 2018-07-13 DIAGNOSIS — R402254 Coma scale, best verbal response, oriented, 24 hours or more after hospital admission: Secondary | ICD-10-CM | POA: Diagnosis not present

## 2018-07-13 LAB — CBC WITH DIFFERENTIAL/PLATELET
Abs Immature Granulocytes: 0 10*3/uL (ref 0.0–0.1)
BASOS PCT: 1 %
Basophils Absolute: 0.1 10*3/uL (ref 0.0–0.1)
EOS ABS: 0.2 10*3/uL (ref 0.0–0.7)
EOS PCT: 4 %
HCT: 28.4 % — ABNORMAL LOW (ref 36.0–46.0)
Hemoglobin: 9.7 g/dL — ABNORMAL LOW (ref 12.0–15.0)
Immature Granulocytes: 1 %
Lymphocytes Relative: 12 %
Lymphs Abs: 0.7 10*3/uL (ref 0.7–4.0)
MCH: 35.5 pg — AB (ref 26.0–34.0)
MCHC: 34.2 g/dL (ref 30.0–36.0)
MCV: 104 fL — ABNORMAL HIGH (ref 78.0–100.0)
MONO ABS: 0.4 10*3/uL (ref 0.1–1.0)
Monocytes Relative: 8 %
Neutro Abs: 4.3 10*3/uL (ref 1.7–7.7)
Neutrophils Relative %: 74 %
PLATELETS: 67 10*3/uL — AB (ref 150–400)
RBC: 2.73 MIL/uL — ABNORMAL LOW (ref 3.87–5.11)
RDW: 17.4 % — ABNORMAL HIGH (ref 11.5–15.5)
WBC: 5.8 10*3/uL (ref 4.0–10.5)

## 2018-07-13 LAB — COMPREHENSIVE METABOLIC PANEL
ALK PHOS: 155 U/L — AB (ref 38–126)
ALT: 31 U/L (ref 0–44)
AST: 63 U/L — ABNORMAL HIGH (ref 15–41)
Albumin: 3.8 g/dL (ref 3.5–5.0)
Anion gap: 14 (ref 5–15)
BUN: 7 mg/dL (ref 6–20)
CALCIUM: 9.5 mg/dL (ref 8.9–10.3)
CHLORIDE: 101 mmol/L (ref 98–111)
CO2: 22 mmol/L (ref 22–32)
CREATININE: 1.19 mg/dL — AB (ref 0.44–1.00)
GFR, EST NON AFRICAN AMERICAN: 53 mL/min — AB (ref >60)
Glucose, Bld: 116 mg/dL — ABNORMAL HIGH (ref 70–99)
Potassium: 2.2 mmol/L — CL (ref 3.5–5.1)
Sodium: 137 mmol/L (ref 135–145)
TOTAL PROTEIN: 8.8 g/dL — AB (ref 6.5–8.1)
Total Bilirubin: 4.7 mg/dL — ABNORMAL HIGH (ref 0.3–1.2)

## 2018-07-13 LAB — POTASSIUM: POTASSIUM: 2.7 mmol/L — AB (ref 3.5–5.1)

## 2018-07-13 LAB — URINALYSIS, ROUTINE W REFLEX MICROSCOPIC
BILIRUBIN URINE: NEGATIVE
GLUCOSE, UA: NEGATIVE mg/dL
HGB URINE DIPSTICK: NEGATIVE
Ketones, ur: NEGATIVE mg/dL
Leukocytes, UA: NEGATIVE
Nitrite: NEGATIVE
PROTEIN: NEGATIVE mg/dL
Specific Gravity, Urine: <1.005 — ABNORMAL LOW (ref 1.005–1.030)
pH: 6 (ref 5.0–8.0)

## 2018-07-13 LAB — RAPID URINE DRUG SCREEN, HOSP PERFORMED
Amphetamines: NOT DETECTED
Barbiturates: NOT DETECTED
Benzodiazepines: NOT DETECTED
Cocaine: NOT DETECTED
OPIATES: NOT DETECTED
TETRAHYDROCANNABINOL: NOT DETECTED

## 2018-07-13 LAB — ETHANOL

## 2018-07-13 LAB — PHOSPHORUS: Phosphorus: 2.2 mg/dL — ABNORMAL LOW (ref 2.5–4.6)

## 2018-07-13 LAB — LIPASE, BLOOD: LIPASE: 67 U/L — AB (ref 11–51)

## 2018-07-13 LAB — MAGNESIUM: MAGNESIUM: 1.5 mg/dL — AB (ref 1.7–2.4)

## 2018-07-13 MED ORDER — POTASSIUM CHLORIDE CRYS ER 20 MEQ PO TBCR
40.0000 meq | EXTENDED_RELEASE_TABLET | Freq: Once | ORAL | Status: AC
  Administered 2018-07-13: 40 meq via ORAL
  Filled 2018-07-13: qty 2

## 2018-07-13 MED ORDER — SODIUM CHLORIDE 0.9 % IV BOLUS (SEPSIS)
1000.0000 mL | Freq: Once | INTRAVENOUS | Status: AC
  Administered 2018-07-13: 1000 mL via INTRAVENOUS

## 2018-07-13 MED ORDER — PREGABALIN 50 MG PO CAPS
50.0000 mg | ORAL_CAPSULE | Freq: Every day | ORAL | Status: DC
  Filled 2018-07-13: qty 1

## 2018-07-13 MED ORDER — POTASSIUM CHLORIDE 10 MEQ/100ML IV SOLN
INTRAVENOUS | Status: AC
  Administered 2018-07-13: 10 meq via INTRAVENOUS
  Filled 2018-07-13: qty 100

## 2018-07-13 MED ORDER — SODIUM CHLORIDE 0.9 % IV SOLN
1000.0000 mL | INTRAVENOUS | Status: DC
  Administered 2018-07-13 – 2018-07-21 (×17): 1000 mL via INTRAVENOUS

## 2018-07-13 MED ORDER — POTASSIUM CHLORIDE 10 MEQ/100ML IV SOLN
10.0000 meq | Freq: Once | INTRAVENOUS | Status: AC
  Administered 2018-07-13: 10 meq via INTRAVENOUS

## 2018-07-13 MED ORDER — POTASSIUM CHLORIDE 10 MEQ/100ML IV SOLN
10.0000 meq | INTRAVENOUS | Status: AC
  Administered 2018-07-13 – 2018-07-14 (×2): 10 meq via INTRAVENOUS
  Filled 2018-07-13 (×2): qty 100

## 2018-07-13 MED ORDER — FAMOTIDINE IN NACL 20-0.9 MG/50ML-% IV SOLN
20.0000 mg | Freq: Once | INTRAVENOUS | Status: AC
  Administered 2018-07-13: 20 mg via INTRAVENOUS
  Filled 2018-07-13: qty 50

## 2018-07-13 MED ORDER — ONDANSETRON HCL 4 MG/2ML IJ SOLN
4.0000 mg | Freq: Once | INTRAMUSCULAR | Status: AC
  Administered 2018-07-13: 4 mg via INTRAVENOUS
  Filled 2018-07-13: qty 2

## 2018-07-13 MED ORDER — SODIUM CHLORIDE 0.9 % IV SOLN
INTRAVENOUS | Status: DC | PRN
  Administered 2018-07-13: 19:00:00 via INTRAVENOUS

## 2018-07-13 MED ORDER — PROMETHAZINE HCL 25 MG/ML IJ SOLN
25.0000 mg | Freq: Once | INTRAMUSCULAR | Status: AC
  Administered 2018-07-13: 25 mg via INTRAVENOUS
  Filled 2018-07-13: qty 1

## 2018-07-13 MED ORDER — POTASSIUM CHLORIDE 2 MEQ/ML IV SOLN
INTRAVENOUS | Status: DC
  Filled 2018-07-13 (×10): qty 1000

## 2018-07-13 MED ORDER — POTASSIUM CHLORIDE 10 MEQ/100ML IV SOLN
10.0000 meq | Freq: Once | INTRAVENOUS | Status: AC
  Administered 2018-07-13: 10 meq via INTRAVENOUS
  Filled 2018-07-13: qty 100

## 2018-07-13 MED FILL — traMADol HCL 50 MG TABS: 50 | 7 days supply | Qty: 14 | Fill #1
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
## 2018-07-13 NOTE — ED Triage Notes (Signed)
Pt c/o chronic abdominal pain r/t pancreatitis

## 2018-07-13 NOTE — Telephone Encounter (Signed)
Patient calling the office with complaints of severe pain. She sounds very distressed, crying and sometimes sounding short of breath. She states her abdominal pain is unbearable. She is having a 'pancreas attack' and her 'spleen needs to come out'. She states "I've never felt this bad. I feel like I'm dying"  Instructed patient to get urgent work up at the ED. She initially pushed back stating that someone told her at the [facility] Emergency Room that she was on a "list of fly by the night patients" and she doesn't think they want to treat her anymore. Again, encouraged patient to seek urgent work up as her pain is unbearable and this office is unable to adequately treat her. She understood and will go to the ED.

## 2018-07-13 NOTE — ED Notes (Signed)
Date and time results received: 07/13/18 1904 (use smartphrase ".now" to insert current time)  Test: Potassium Critical Value: 2.2  Name of Provider Notified: Dr. Vallery Ridge  Orders Received? Or Actions Taken?: new order noted

## 2018-07-13 NOTE — ED Notes (Signed)
Unable to void at this time.

## 2018-07-13 NOTE — ED Notes (Signed)
ED Provider at bedside. 

## 2018-07-13 NOTE — ED Notes (Signed)
Report given to: Verta Ellen, RN with [facility].

## 2018-07-13 NOTE — ED Notes (Signed)
LR with KCl is not available. EDP made aware. New order noted.

## 2018-07-13 NOTE — ED Notes (Signed)
Report given to: Kathlyn [place], RN at [facility].
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
## 2018-07-13 NOTE — ED Notes (Signed)
Patient ambulated to restroom; steady gait observed. 

## 2018-07-13 NOTE — Plan of Care (Signed)
Jamie Allen is a 47 year old female with pmh significant of liver cirrhosis, alcoholism with no active drinking, previous C. difficile colitis, hypothyroidism, anxiety disorder and chronic abdominal pain with previous splenectomy; who presents with abdominal pain.  Vital signs noted temperature 98.4 F, pulse 72-1 06, respirations 16-22, blood pressure 134/92-140 5/72, and O2 saturations 100%.  Labs revealed hemoglobin 9.7, platelets 67, potassium 2.2, BUN 7, creatinine 1.19, lipase 67, and AST 63.  Patient was given 20 mEq of potassium chloride IV, 40 mEq p.o. of potassium chloride, Zofran, Phenergan, and IV fluids.  TRH called to admit.  Accepted to a telemetry bed for observation.

## 2018-07-14 ENCOUNTER — Encounter (HOSPITAL_COMMUNITY): Payer: Self-pay | Admitting: Internal Medicine

## 2018-07-14 DIAGNOSIS — F17210 Nicotine dependence, cigarettes, uncomplicated: Secondary | ICD-10-CM | POA: Diagnosis present

## 2018-07-14 DIAGNOSIS — R112 Nausea with vomiting, unspecified: Secondary | ICD-10-CM | POA: Diagnosis not present

## 2018-07-14 DIAGNOSIS — N289 Disorder of kidney and ureter, unspecified: Secondary | ICD-10-CM

## 2018-07-14 DIAGNOSIS — E860 Dehydration: Secondary | ICD-10-CM | POA: Diagnosis present

## 2018-07-14 DIAGNOSIS — K648 Other hemorrhoids: Secondary | ICD-10-CM | POA: Diagnosis present

## 2018-07-14 DIAGNOSIS — K529 Noninfective gastroenteritis and colitis, unspecified: Secondary | ICD-10-CM | POA: Diagnosis not present

## 2018-07-14 DIAGNOSIS — K766 Portal hypertension: Secondary | ICD-10-CM | POA: Diagnosis present

## 2018-07-14 DIAGNOSIS — K700 Alcoholic fatty liver: Secondary | ICD-10-CM | POA: Diagnosis present

## 2018-07-14 DIAGNOSIS — F419 Anxiety disorder, unspecified: Secondary | ICD-10-CM | POA: Diagnosis present

## 2018-07-14 DIAGNOSIS — F329 Major depressive disorder, single episode, unspecified: Secondary | ICD-10-CM | POA: Diagnosis present

## 2018-07-14 DIAGNOSIS — I4581 Long QT syndrome: Secondary | ICD-10-CM | POA: Diagnosis present

## 2018-07-14 DIAGNOSIS — G8929 Other chronic pain: Secondary | ICD-10-CM | POA: Diagnosis present

## 2018-07-14 DIAGNOSIS — K860 Alcohol-induced chronic pancreatitis: Secondary | ICD-10-CM | POA: Diagnosis not present

## 2018-07-14 DIAGNOSIS — R1084 Generalized abdominal pain: Secondary | ICD-10-CM | POA: Diagnosis not present

## 2018-07-14 DIAGNOSIS — Z8619 Personal history of other infectious and parasitic diseases: Secondary | ICD-10-CM | POA: Diagnosis not present

## 2018-07-14 DIAGNOSIS — R109 Unspecified abdominal pain: Secondary | ICD-10-CM | POA: Diagnosis not present

## 2018-07-14 DIAGNOSIS — K7030 Alcoholic cirrhosis of liver without ascites: Secondary | ICD-10-CM | POA: Diagnosis present

## 2018-07-14 DIAGNOSIS — F1120 Opioid dependence, uncomplicated: Secondary | ICD-10-CM | POA: Diagnosis present

## 2018-07-14 DIAGNOSIS — K219 Gastro-esophageal reflux disease without esophagitis: Secondary | ICD-10-CM | POA: Diagnosis present

## 2018-07-14 DIAGNOSIS — D61818 Other pancytopenia: Secondary | ICD-10-CM | POA: Diagnosis not present

## 2018-07-14 DIAGNOSIS — D849 Immunodeficiency, unspecified: Secondary | ICD-10-CM | POA: Diagnosis present

## 2018-07-14 DIAGNOSIS — I700 Atherosclerosis of aorta: Secondary | ICD-10-CM | POA: Diagnosis present

## 2018-07-14 DIAGNOSIS — R197 Diarrhea, unspecified: Secondary | ICD-10-CM | POA: Diagnosis not present

## 2018-07-14 DIAGNOSIS — K861 Other chronic pancreatitis: Secondary | ICD-10-CM | POA: Diagnosis present

## 2018-07-14 DIAGNOSIS — K859 Acute pancreatitis without necrosis or infection, unspecified: Secondary | ICD-10-CM | POA: Diagnosis present

## 2018-07-14 DIAGNOSIS — R9431 Abnormal electrocardiogram [ECG] [EKG]: Secondary | ICD-10-CM

## 2018-07-14 DIAGNOSIS — E876 Hypokalemia: Secondary | ICD-10-CM | POA: Diagnosis present

## 2018-07-14 DIAGNOSIS — K909 Intestinal malabsorption, unspecified: Secondary | ICD-10-CM | POA: Diagnosis present

## 2018-07-14 DIAGNOSIS — D539 Nutritional anemia, unspecified: Secondary | ICD-10-CM | POA: Diagnosis present

## 2018-07-14 DIAGNOSIS — F1020 Alcohol dependence, uncomplicated: Secondary | ICD-10-CM | POA: Diagnosis present

## 2018-07-14 DIAGNOSIS — K7460 Unspecified cirrhosis of liver: Secondary | ICD-10-CM | POA: Diagnosis not present

## 2018-07-14 DIAGNOSIS — R002 Palpitations: Secondary | ICD-10-CM | POA: Diagnosis present

## 2018-07-14 DIAGNOSIS — R161 Splenomegaly, not elsewhere classified: Secondary | ICD-10-CM | POA: Diagnosis present

## 2018-07-14 LAB — BASIC METABOLIC PANEL
Anion gap: 7 (ref 5–15)
BUN: 5 mg/dL — ABNORMAL LOW (ref 6–20)
CHLORIDE: 113 mmol/L — AB (ref 98–111)
CO2: 21 mmol/L — ABNORMAL LOW (ref 22–32)
Calcium: 8.8 mg/dL — ABNORMAL LOW (ref 8.9–10.3)
Creatinine, Ser: 0.9 mg/dL (ref 0.44–1.00)
GFR calc non Af Amer: >60 mL/min (ref >60)
Glucose, Bld: 117 mg/dL — ABNORMAL HIGH (ref 70–99)
POTASSIUM: 2.9 mmol/L — AB (ref 3.5–5.1)
SODIUM: 141 mmol/L (ref 135–145)

## 2018-07-14 LAB — MAGNESIUM: Magnesium: 2.3 mg/dL (ref 1.7–2.4)

## 2018-07-14 LAB — CBC
HCT: 22.9 % — ABNORMAL LOW (ref 36.0–46.0)
Hemoglobin: 8.1 g/dL — ABNORMAL LOW (ref 12.0–15.0)
MCH: 36.3 pg — AB (ref 26.0–34.0)
MCHC: 35.4 g/dL (ref 30.0–36.0)
MCV: 102.7 fL — ABNORMAL HIGH (ref 78.0–100.0)
Platelets: 63 10*3/uL — ABNORMAL LOW (ref 150–400)
RBC: 2.23 MIL/uL — AB (ref 3.87–5.11)
RDW: 18.1 % — ABNORMAL HIGH (ref 11.5–15.5)
WBC: 4.4 10*3/uL (ref 4.0–10.5)

## 2018-07-14 LAB — LIPID PANEL
CHOL/HDL RATIO: 8.5 ratio
CHOLESTEROL: 338 mg/dL — AB (ref 0–200)
HDL: 40 mg/dL — AB (ref >40)
LDL Cholesterol: 252 mg/dL — ABNORMAL HIGH (ref 0–99)
Triglycerides: 230 mg/dL — ABNORMAL HIGH (ref <150)
VLDL: 46 mg/dL — ABNORMAL HIGH (ref 0–40)

## 2018-07-14 LAB — CLOSTRIDIUM DIFFICILE BY PCR, REFLEXED: CDIFFPCR: NEGATIVE

## 2018-07-14 LAB — TSH: TSH: 1.41 u[IU]/mL (ref 0.350–4.500)

## 2018-07-14 MED ORDER — OXYCODONE-ACETAMINOPHEN 5-325 MG PO TABS
1.0000 | ORAL_TABLET | ORAL | Status: DC | PRN
  Administered 2018-07-14 – 2018-07-17 (×19): 1 via ORAL
  Filled 2018-07-14 (×19): qty 1

## 2018-07-14 MED ORDER — SUCRALFATE 1 GM/10ML PO SUSP
1.0000 g | Freq: Three times a day (TID) | ORAL | Status: DC
  Administered 2018-07-14 – 2018-07-20 (×23): 1 g via ORAL
  Filled 2018-07-14 (×24): qty 10

## 2018-07-14 MED ORDER — MAGNESIUM SULFATE 2 GM/50ML IV SOLN
2.0000 g | Freq: Once | INTRAVENOUS | Status: AC
  Administered 2018-07-14: 2 g via INTRAVENOUS
  Filled 2018-07-14: qty 50

## 2018-07-14 MED ORDER — SODIUM CHLORIDE 0.9% FLUSH
3.0000 mL | Freq: Two times a day (BID) | INTRAVENOUS | Status: DC
  Administered 2018-07-15 – 2018-07-22 (×11): 3 mL via INTRAVENOUS

## 2018-07-14 MED ORDER — DIPHENHYDRAMINE HCL 50 MG PO CAPS
50.0000 mg | ORAL_CAPSULE | Freq: Once | ORAL | Status: AC
  Administered 2018-07-15: 50 mg via ORAL
  Filled 2018-07-14: qty 1

## 2018-07-14 MED ORDER — POTASSIUM CHLORIDE CRYS ER 20 MEQ PO TBCR: 40.0000 meq | EXTENDED_RELEASE_TABLET | Freq: Two times a day (BID) | ORAL | Status: DC

## 2018-07-14 MED ORDER — PREGABALIN 50 MG PO CAPS
50.0000 mg | ORAL_CAPSULE | Freq: Three times a day (TID) | ORAL | Status: DC
  Administered 2018-07-14 – 2018-07-23 (×28): 50 mg via ORAL
  Filled 2018-07-14 (×28): qty 1

## 2018-07-14 MED ORDER — ACETAMINOPHEN 325 MG PO TABS
650.0000 mg | ORAL_TABLET | Freq: Four times a day (QID) | ORAL | Status: DC | PRN
  Administered 2018-07-16 – 2018-07-21 (×2): 650 mg via ORAL
  Filled 2018-07-14 (×2): qty 2

## 2018-07-14 MED ORDER — ACETAMINOPHEN 650 MG RE SUPP: 650.0000 mg | Freq: Four times a day (QID) | RECTAL | Status: DC | PRN

## 2018-07-14 MED ORDER — DIPHENHYDRAMINE HCL 50 MG/ML IJ SOLN: 50.0000 mg | Freq: Once | INTRAMUSCULAR | Status: AC

## 2018-07-14 MED ORDER — K PHOS MONO-SOD PHOS DI & MONO 155-852-130 MG PO TABS
500.0000 mg | ORAL_TABLET | Freq: Two times a day (BID) | ORAL | Status: DC
  Administered 2018-07-14 – 2018-07-15 (×4): 500 mg via ORAL
  Filled 2018-07-14 (×6): qty 2

## 2018-07-14 MED ORDER — K PHOS MONO-SOD PHOS DI & MONO 155-852-130 MG PO TABS
500.0000 mg | ORAL_TABLET | Freq: Two times a day (BID) | ORAL | Status: DC
  Filled 2018-07-14 (×2): qty 2

## 2018-07-14 MED ORDER — PREDNISONE 50 MG PO TABS
50.0000 mg | ORAL_TABLET | Freq: Four times a day (QID) | ORAL | Status: AC
  Administered 2018-07-14 – 2018-07-15 (×3): 50 mg via ORAL
  Filled 2018-07-14 (×3): qty 1

## 2018-07-14 MED ORDER — MELATONIN 5 MG PO TABS
10.0000 mg | ORAL_TABLET | Freq: Every evening | ORAL | Status: DC | PRN
  Administered 2018-07-14 – 2018-07-21 (×9): 10 mg via ORAL
  Filled 2018-07-14 (×11): qty 2

## 2018-07-14 MED ORDER — PANCRELIPASE (LIP-PROT-AMYL) 12000-38000 UNITS PO CPEP
36000.0000 [IU] | ORAL_CAPSULE | Freq: Three times a day (TID) | ORAL | Status: DC
  Administered 2018-07-14 – 2018-07-20 (×17): 36000 [IU] via ORAL
  Filled 2018-07-14 (×18): qty 3
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
## 2018-07-14 NOTE — Progress Notes (Signed)
PROGRESS NOTE    Jamie Allen  QBV:[id number] DOB: Jun 07, 1971 DOA: 07/13/2018 PCP: Ladell Pier, MD    Brief Narrative:  47 y.o. female with medical history significant of significant ofliver cirrhosis, alcoholism, previous C. difficile colitis, hypothyroidism, pancytopenia, anxiety, and chronic abdominal pain; who presents with complaints of abdominal pain with nausea, vomiting, and diarrhea over the last 4 days.  Pain is mostly epigastric in nature constant, but also notes pain in the lower quadrants that she relates to her spleen being enlarged.  She has had several episodes of emesis and diarrhea both were noted to be yellow in color.  She denies any use of alcohol.  Associated symptoms include generalized malaise, weakness, and palpitations.  Patient reports that she has not been taking potassium pills due to unclear instructions and the fact that they make her nauseous and vomit.  She has a history of hypothyroidism, but reports that she has not been on the medication for she has a follow-up back up with her primary care provider.  Patient had just been hospitalized from 9/14-9/15 with similar symptoms thought to be related to pancreatitis.  However, patient left against Medical advice after she started feeling better after initial therapies.  C. difficile studies at that time were noted to be negative.  Upon admission into the emergency department patient was seen to be afebrile, pulse 72-1 06, respirations 16-22, blood pressure 134/92-140 5/72, and O2 saturations 100%. Labs revealed hemoglobin 9.7, platelets 67, potassium 2.2, BUN 7, creatinine 1.19, lipase 67, and AST 63. Patient was given 20 mEq of potassium chloride IV, 40 mEq p.o. of potassium chloride,Zofran,Phenergan, and IV fluids. TRH called to admit  Assessment & Plan:   Principal Problem:   Abdominal pain Active Problems:   Anxiety and depression   Hypomagnesemia   Pancytopenia (HCC)   Non-intractable  vomiting with nausea   Hypokalemia   Prolonged Q-T interval on ECG   Chronic pancreatitis (HCC)   Renal insufficiency  Abdominal pain, pancreatitis: Acute on chronic likely secondary to pancreatitis - .  Patient presents with complaints of abdominal pain lipase elevated at 67. Recent CT findings suggestive of pancreatitis vs enteritis - Continue Creon -Will need continued IVF hydration per below -Repeat lipase in AM  Nausea, vomiting, diarrhea:  -Likely related to above pancreatitis -CT with findings suggesting possible enteritis.  -Recent Cdiff study neg. -Have ordered stool study, pending results -Will hold off on empiric abx given significant past hx of cdiff colitis requiring stool transplant -Still having significant diarrhea thus will likely result in potentially life-threatening dehydration, thus will continue with IVF hydration inpatient -Given prior history of cdiff colitis, known to [facility]. Will consult GI for further recommendations  Prolonged QT interval:  - Acute. Initial QTc 516 on admission. - Correct electrolyte abnormality - Hold QT prolonging medications -Continued on telemetry  Hypokalemia:  -Presenting K of 2.2 noted -Likely secondary to nausea/vomiting prior to admit -Replacement given. -Will repeat bmet in AM  Hypomagnesia:  - Initial magnesium level noted to be 1.5 on admission. - Given 2 g of magnesium sulfate - Repeat Mg level in AM  Liver cirrhosis with splenomegaly:  -Patient with history of liver disease related with history of alcoholism. -Recommend continued ETOH cessation  -Ultrasound from 8/28 revealed notes that splenic volume was increased from 1325cm to 1447 cm.   -Patient originally planned to follow-up with surgery to have a splenectomy.   - Continue outpatient follow-up with general surgery for need of splenectomy  Pancytopenia:  -  Presenting hemoglobin 9.7 and platelet count 67.   -Suspecting cirrhosis as possible  contributing factor in thrombocytopenia -Patient had been followed by Dr. Marin Olp and receives intermittent blood transfusions. - Repeat CBC in AM  Hypothyroidism:  -Patient reports previously being on levothyroxine, but has not been on this medication due to needing to follow-up with her primary care provider. - TSH reviewed, within normal limits  Renal insufficiency:  -Patient presents with creatinine 1.9 on admission.  Baseline creatinine previously noted to be her within normal limits in August. - Likely secondary to dehydration related to vomiting and diarrhea -Improved with IVF hydration  GERD - Held PPIs due to prolonged QT interval also hx of cdiff colitis - Continue Carafate - Appears stable at this time  DVT prophylaxis: SCD's Code Status: Full Family Communication: Pt in room, family at bedside Disposition Plan: Uncertain at this time  Consultants:   [facility]  Procedures:     Antimicrobials: Anti-infectives (From admission, onward)   None       Subjective: Complaining of continued diarrhea  Objective: Vitals:   07/13/18 2217 07/13/18 2327 07/14/18 0440 07/14/18 1423  BP: (!) 124/54 111/66 (!) 118/57 107/61  Pulse: 76 75 76 75  Resp: 20 14 20    Temp: 98 F (36.7 C) 98.2 F (36.8 C) 98.2 F (36.8 C) 98.2 F (36.8 C)  TempSrc: Oral Oral Oral Oral  SpO2: 100% 100% 100% 100%  Weight:      Height:        Intake/Output Summary (Last 24 hours) at 07/14/2018 1532 Last data filed at 07/14/2018 1423 Gross per 24 hour  Intake 3551.69 ml  Output -  Net 3551.69 ml   Filed Weights   07/13/18 1645  Weight: 58.1 kg    Examination:  General exam: Appears calm and comfortable  Respiratory system: Clear to auscultation. Respiratory effort normal. Cardiovascular system: S1 & S2 heard, RRR. Gastrointestinal system: Pos BS, soft, generally tender Central nervous system: Alert and oriented. No focal neurological deficits. Extremities: Symmetric 5 x 5  power. Skin: No rashes, lesions Psychiatry: Judgement and insight appear normal. Mood & affect appropriate.   Data Reviewed: I have personally reviewed following labs and imaging studies  CBC: Recent Labs  Lab 07/13/18 1810 07/14/18 0514  WBC 5.8 4.4  NEUTROABS 4.3  --   HGB 9.7* 8.1*  HCT 28.4* 22.9*  MCV 104.0* 102.7*  PLT 67* 63*   Basic Metabolic Panel: Recent Labs  Lab 07/13/18 1810 07/13/18 2005 07/14/18 0514  NA 137  --  141  K 2.2* 2.7* 2.9*  CL 101  --  113*  CO2 22  --  21*  GLUCOSE 116*  --  117*  BUN 7  --  5*  CREATININE 1.19*  --  0.90  CALCIUM 9.5  --  8.8*  MG  --  1.5* 2.3  PHOS  --  2.2*  --    GFR: Estimated Creatinine Clearance: 63.3 mL/min (by C-G formula based on SCr of 0.9 mg/dL). Liver Function Tests: Recent Labs  Lab 07/13/18 1810  AST 63*  ALT 31  ALKPHOS 155*  BILITOT 4.7*  PROT 8.8*  ALBUMIN 3.8   Recent Labs  Lab 07/13/18 1810  LIPASE 67*   No results for input(s): AMMONIA in the last 168 hours. Coagulation Profile: No results for input(s): INR, PROTIME in the last 168 hours. Cardiac Enzymes: No results for input(s): CKTOTAL, CKMB, CKMBINDEX, TROPONINI in the last 168 hours. BNP (last 3 results) No results  for input(s): PROBNP in the last 8760 hours. HbA1C: No results for input(s): HGBA1C in the last 72 hours. CBG: No results for input(s): GLUCAP in the last 168 hours. Lipid Profile: Recent Labs    07/14/18 0514  CHOL 338*  HDL 40*  LDLCALC 252*  TRIG 230*  CHOLHDL 8.5   Thyroid Function Tests: Recent Labs    07/14/18 0514  TSH 1.410   Anemia Panel: No results for input(s): VITAMINB12, FOLATE, FERRITIN, TIBC, IRON, RETICCTPCT in the last 72 hours. Sepsis Labs: No results for input(s): PROCALCITON, LATICACIDVEN in the last 168 hours.  No results found for this or any previous visit (from the past 240 hour(s)).   Radiology Studies: No results found.  Scheduled Meds: . lipase/protease/amylase  36,000  Units Oral TID AC  . phosphorus  500 mg Oral BID  . pregabalin  50 mg Oral TID  . sodium chloride flush  3 mL Intravenous Q12H  . sucralfate  1 g Oral TID WC & HS   Continuous Infusions: . sodium chloride 1,000 mL (07/14/18 1308)  . sodium chloride 10 mL/hr at 07/13/18 1850     LOS: 0 days   Marylu Lund, MD [facility] Pager On Amion  If 7PM-7AM, please contact night-coverage 07/14/2018, 3:32 PM

## 2018-07-15 ENCOUNTER — Inpatient Hospital Stay (HOSPITAL_COMMUNITY): Payer: Medicaid Other

## 2018-07-15 DIAGNOSIS — R197 Diarrhea, unspecified: Secondary | ICD-10-CM

## 2018-07-15 DIAGNOSIS — R112 Nausea with vomiting, unspecified: Secondary | ICD-10-CM

## 2018-07-15 DIAGNOSIS — E876 Hypokalemia: Secondary | ICD-10-CM

## 2018-07-15 DIAGNOSIS — R1084 Generalized abdominal pain: Secondary | ICD-10-CM

## 2018-07-15 DIAGNOSIS — K7460 Unspecified cirrhosis of liver: Secondary | ICD-10-CM

## 2018-07-15 DIAGNOSIS — K219 Gastro-esophageal reflux disease without esophagitis: Secondary | ICD-10-CM

## 2018-07-15 LAB — GASTROINTESTINAL PANEL BY PCR, STOOL (REPLACES STOOL CULTURE)
ASTROVIRUS: NOT DETECTED
Adenovirus F40/41: NOT DETECTED
CAMPYLOBACTER SPECIES: NOT DETECTED
CYCLOSPORA CAYETANENSIS: NOT DETECTED
Cryptosporidium: NOT DETECTED
ENTEROTOXIGENIC E COLI (ETEC): NOT DETECTED
Entamoeba histolytica: NOT DETECTED
Enteroaggregative E coli (EAEC): NOT DETECTED
Enteropathogenic E coli (EPEC): NOT DETECTED
Giardia lamblia: NOT DETECTED
Norovirus GI/GII: NOT DETECTED
PLESIMONAS SHIGELLOIDES: NOT DETECTED
ROTAVIRUS A: NOT DETECTED
SAPOVIRUS (I, II, IV, AND V): NOT DETECTED
SHIGA LIKE TOXIN PRODUCING E COLI (STEC): NOT DETECTED
Salmonella species: NOT DETECTED
Shigella/Enteroinvasive E coli (EIEC): NOT DETECTED
VIBRIO SPECIES: NOT DETECTED
Vibrio cholerae: NOT DETECTED
Yersinia enterocolitica: NOT DETECTED

## 2018-07-15 LAB — LIPASE, BLOOD: Lipase: 94 U/L — ABNORMAL HIGH (ref 11–51)

## 2018-07-15 LAB — CBC
HEMATOCRIT: 22.4 % — AB (ref 36.0–46.0)
HEMOGLOBIN: 7.9 g/dL — AB (ref 12.0–15.0)
MCH: 35.9 pg — ABNORMAL HIGH (ref 26.0–34.0)
MCHC: 35.3 g/dL (ref 30.0–36.0)
MCV: 101.8 fL — ABNORMAL HIGH (ref 78.0–100.0)
Platelets: 55 10*3/uL — ABNORMAL LOW (ref 150–400)
RBC: 2.2 MIL/uL — AB (ref 3.87–5.11)
RDW: 17.8 % — ABNORMAL HIGH (ref 11.5–15.5)
WBC: 3.3 10*3/uL — AB (ref 4.0–10.5)

## 2018-07-15 LAB — COMPREHENSIVE METABOLIC PANEL
ALBUMIN: 3 g/dL — AB (ref 3.5–5.0)
ALK PHOS: 125 U/L (ref 38–126)
ALT: 26 U/L (ref 0–44)
AST: 52 U/L — AB (ref 15–41)
Anion gap: 10 (ref 5–15)
BUN: 6 mg/dL (ref 6–20)
CALCIUM: 8.3 mg/dL — AB (ref 8.9–10.3)
CO2: 19 mmol/L — AB (ref 22–32)
CREATININE: 0.75 mg/dL (ref 0.44–1.00)
Chloride: 110 mmol/L (ref 98–111)
GFR calc Af Amer: >60 mL/min (ref >60)
GFR calc non Af Amer: >60 mL/min (ref >60)
GLUCOSE: 164 mg/dL — AB (ref 70–99)
Potassium: 3.4 mmol/L — ABNORMAL LOW (ref 3.5–5.1)
SODIUM: 139 mmol/L (ref 135–145)
TOTAL PROTEIN: 6.9 g/dL (ref 6.5–8.1)
Total Bilirubin: 4.2 mg/dL — ABNORMAL HIGH (ref 0.3–1.2)

## 2018-07-15 LAB — PHOSPHORUS: Phosphorus: 2.8 mg/dL (ref 2.5–4.6)

## 2018-07-15 LAB — PREGNANCY, URINE: Preg Test, Ur: NEGATIVE

## 2018-07-15 MED ORDER — EPINEPHRINE PF 1 MG/ML IJ SOLN
INTRAMUSCULAR | Status: AC
  Filled 2018-07-15: qty 1

## 2018-07-15 MED ORDER — NICOTINE 14 MG/24HR TD PT24
14.0000 mg | MEDICATED_PATCH | Freq: Every day | TRANSDERMAL | Status: DC
  Administered 2018-07-15 – 2018-07-23 (×9): 14 mg via TRANSDERMAL
  Filled 2018-07-15 (×9): qty 1

## 2018-07-15 MED ORDER — LOPERAMIDE HCL 2 MG PO CAPS
2.0000 mg | ORAL_CAPSULE | Freq: Once | ORAL | Status: AC
  Administered 2018-07-15: 2 mg via ORAL
  Filled 2018-07-15: qty 1

## 2018-07-15 MED ORDER — IOHEXOL 300 MG/ML  SOLN
100.0000 mL | Freq: Once | INTRAMUSCULAR | Status: AC | PRN
  Administered 2018-07-15: 100 mL via INTRAVENOUS

## 2018-07-15 MED ORDER — PREDNISONE 50 MG PO TABS
50.0000 mg | ORAL_TABLET | Freq: Once | ORAL | Status: AC
  Administered 2018-07-15: 50 mg via ORAL
  Filled 2018-07-15: qty 1

## 2018-07-15 MED ORDER — ONDANSETRON HCL 4 MG/2ML IJ SOLN
4.0000 mg | Freq: Four times a day (QID) | INTRAMUSCULAR | Status: DC | PRN
  Administered 2018-07-15 – 2018-07-21 (×7): 4 mg via INTRAVENOUS
  Filled 2018-07-15 (×9): qty 2

## 2018-07-15 MED ORDER — EPINEPHRINE PF 1 MG/10ML IJ SOSY
PREFILLED_SYRINGE | INTRAMUSCULAR | Status: AC
  Filled 2018-07-15: qty 10

## 2018-07-15 NOTE — Progress Notes (Signed)
    Progress Note   Subjective  Chief Complaint: Diarrhea, abdominal pain, nausea and vomiting  This morning patient expresses that she had at least 20 loose bowel movements yesterday and has already had 4 this morning which she describes as "yellow", she thinks the last one may have been starting to "get some form".  Discusses her history of cirrhosis and abdominal pain.  Describes that she was actually somewhat comfortable yesterday while in the hospital.  Denies any new complaints or concerns.   Objective   Vital signs in last 24 hours: Temp:  [97.9 F (36.6 C)-98.2 F (36.8 C)] 97.9 F (36.6 C) (09/26 0455) Pulse Rate:  [64-75] 64 (09/26 0455) Resp:  [16] 16 (09/26 0455) BP: (107-134)/(50-61) 121/58 (09/26 0455) SpO2:  [99 %-100 %] 99 % (09/26 0455) Last BM Date: 07/15/18 General:    white female in NAD Heart:  Regular rate and rhythm; no murmurs Lungs: Respirations even and unlabored, lungs CTA bilaterally Abdomen:  Soft, mild epigastric ttp and nondistended. Normal bowel sounds. Extremities:  Without edema. Neurologic:  Alert and oriented,  grossly normal neurologically. Psych:  Cooperative. Normal mood and affect.  Intake/Output from previous day: 09/25 0701 - 09/26 0700 In: 2026.4 [P.O.:720; I.V.:1306.4] Out: -   Lab Results: Recent Labs    07/13/18 1810 07/14/18 0514 07/15/18 0507  WBC 5.8 4.4 3.3*  HGB 9.7* 8.1* 7.9*  HCT 28.4* 22.9* 22.4*  PLT 67* 63* 55*   BMET Recent Labs    07/13/18 1810 07/13/18 2005 07/14/18 0514 07/15/18 0507  NA 137  --  141 139  K 2.2* 2.7* 2.9* 3.4*  CL 101  --  113* 110  CO2 22  --  21* 19*  GLUCOSE 116*  --  117* 164*  BUN 7  --  5* 6  CREATININE 1.19*  --  0.90 0.75  CALCIUM 9.5  --  8.8* 8.3*   LFT Recent Labs    07/15/18 0507  PROT 6.9  ALBUMIN 3.0*  AST 52*  ALT 26  ALKPHOS 125  BILITOT 4.2*    Assessment / Plan:   Assessment: 1.  Abdominal pain: Continues in the epigastrium/right upper quadrant, CT  pending from today, previous history of pancreatitis 2.  Nausea/ vomiting /diarrhea: Continues with nausea, not so much vomiting, continues at least 20 loose diarrheal stools per day, GI pathogen panel negative, C. difficile negative at last admission ; consider microscopic colitis versus enteritis  3.  Prolonged QT interval: Initial QTC 516 4.  Hypokalemia: Improving but still needs supplementation 5.  Liver cirrhosis with splenomegaly 6.  Pancytopenia  Plan: 1.  GI pathogen panel has returned negative 2.  Repeat CT was ordered today and is pending. 3.  Again would hold off on empiric antibiotics unless deemed absolutely necessary given patient's history of C. difficile with fecal transplant 4.  We will await results of CT scan for further recommendations.  Could consider a colonoscopy with biopsies for microscopic colitis in the future if diarrhea continues and CT scan does not show any further small bowel inflammation. 5.  Please await further recommendations from Dr. Rush Landmark later today  Thank you for kind consultation, we will continue to follow.    LOS: 1 day   Levin Erp  07/15/2018, 11:03 AM

## 2018-07-15 NOTE — Progress Notes (Signed)
As per CT instructions, 50 mg of Prednisone and 50 mg of Benadryl po given to patient.  Urine pregnancy test sent.

## 2018-07-15 NOTE — Progress Notes (Signed)
PROGRESS NOTE    Jamie Allen  ZTI:[id number] DOB: 10-May-1971 DOA: 07/13/2018 PCP: Ladell Pier, MD    Brief Narrative:  47 y.o. female with medical history significant of significant ofliver cirrhosis, alcoholism, previous C. difficile colitis, hypothyroidism, pancytopenia, anxiety, and chronic abdominal pain; who presents with complaints of abdominal pain with nausea, vomiting, and diarrhea over the last 4 days.  Pain is mostly epigastric in nature constant, but also notes pain in the lower quadrants that she relates to her spleen being enlarged.  She has had several episodes of emesis and diarrhea both were noted to be yellow in color.  She denies any use of alcohol.  Associated symptoms include generalized malaise, weakness, and palpitations.  Patient reports that she has not been taking potassium pills due to unclear instructions and the fact that they make her nauseous and vomit.  She has a history of hypothyroidism, but reports that she has not been on the medication for she has a follow-up back up with her primary care provider.  Patient had just been hospitalized from 9/14-9/15 with similar symptoms thought to be related to pancreatitis.  However, patient left against Medical advice after she started feeling better after initial therapies.  C. difficile studies at that time were noted to be negative.  Upon admission into the emergency department patient was seen to be afebrile, pulse 72-1 06, respirations 16-22, blood pressure 134/92-140 5/72, and O2 saturations 100%. Labs revealed hemoglobin 9.7, platelets 67, potassium 2.2, BUN 7, creatinine 1.19, lipase 67, and AST 63. Patient was given 20 mEq of potassium chloride IV, 40 mEq p.o. of potassium chloride,Zofran,Phenergan, and IV fluids. TRH called to admit  Assessment & Plan:   Principal Problem:   Abdominal pain Active Problems:   Anxiety and depression   Hypomagnesemia   Pancytopenia (HCC)   Non-intractable  vomiting with nausea   Hypokalemia   Prolonged Q-T interval on ECG   Chronic pancreatitis (HCC)   Renal insufficiency   Pancreatitis  Abdominal pain, pancreatitis: Acute on chronic likely secondary to pancreatitis - .  Patient presents with complaints of abdominal pain lipase elevated at 67. Recent CT findings suggestive of pancreatitis vs enteritis - Continue Creon -Will need continued IVF hydration per below -Lipase higher today, denies abd pain currently  Nausea, vomiting, diarrhea:  -Likely related to above pancreatitis -CT with findings suggesting possible enteritis.  -Recent Cdiff study neg. -Have ordered stool study, pending results -Will hold off on empiric abx given significant past hx of cdiff colitis requiring stool transplant -Still having significant diarrhea thus will likely result in potentially life-threatening dehydration, thus will continue with IVF hydration inpatient -Cdiff neg. Stool studies neg. Cont to hold off abx  Prolonged QT interval:  - Acute. Initial QTc 516 on admission. - Correct electrolyte abnormality - Hold QT prolonging medications - Continued on telemetry  Hypokalemia:  -Presenting K of 2.2 noted -Likely secondary to nausea/vomiting prior to admit -Replacement given. -Recheck bmet in AM  Hypomagnesia:  - Initial magnesium level noted to be 1.5 on admission. - Given 2 g of magnesium sulfate - Replaced  Liver cirrhosis with splenomegaly:  -Patient with history of liver disease related with history of alcoholism. -Recommend continued ETOH cessation  -Ultrasound from 8/28 revealed notes that splenic volume was increased from 1325cm to 1447 cm.   -Patient originally planned to follow-up with surgery to have a splenectomy.   - Continue outpatient follow-up with general surgery for need of splenectomy -Stable at present  Pancytopenia:  -  Presenting hemoglobin 9.7 and platelet count 67.   -Suspecting cirrhosis as possible  contributing factor in thrombocytopenia -Patient had been followed by Dr. Marin Olp and receives intermittent blood transfusions. - hgb 7.9 today. Repeat cbc in AM  Hypothyroidism:  -Patient reports previously being on levothyroxine, but has not been on this medication due to needing to follow-up with her primary care provider. - TSH reviewed, within normal limits, stable this AM  Renal insufficiency:  -Patient presents with creatinine 1.9 on admission.  Baseline creatinine previously noted to be her within normal limits in August. - Likely secondary to dehydration related to vomiting and diarrhea -Improved with IVF hydration - Repeat bmet in AM  GERD - Held PPIs due to prolonged QT interval also hx of cdiff colitis - Continue Carafate - Stable currently  DVT prophylaxis: SCD's Code Status: Full Family Communication: Pt in room, family at bedside Disposition Plan: Uncertain at this time  Consultants:   [facility]  Procedures:     Antimicrobials: Anti-infectives (From admission, onward)   None      Subjective: Still with diarrhea. No chest pain  Objective: Vitals:   07/14/18 1423 07/14/18 2147 07/15/18 0455 07/15/18 1241  BP: 107/61 (!) 134/50 (!) 121/58 (!) 144/69  Pulse: 75 75 64 72  Resp:  16 16   Temp: 98.2 F (36.8 C) 98.1 F (36.7 C) 97.9 F (36.6 C) 97.9 F (36.6 C)  TempSrc: Oral Oral Oral Oral  SpO2: 100% 100% 99% 100%  Weight:      Height:       No intake or output data in the 24 hours ending 07/15/18 1825 Filed Weights   07/13/18 1645  Weight: 58.1 kg    Examination: General exam: Awake, laying in bed, in nad Respiratory system: Normal respiratory effort, no wheezing Cardiovascular system: regular rate, s1, s2 Gastrointestinal system: Soft, nondistended, positive BS Central nervous system: CN2-12 grossly intact, strength intact Extremities: Perfused, no clubbing Skin: Normal skin turgor, no notable skin lesions seen Psychiatry: Mood  normal // no visual hallucinations   Data Reviewed: I have personally reviewed following labs and imaging studies  CBC: Recent Labs  Lab 07/13/18 1810 07/14/18 0514 07/15/18 0507  WBC 5.8 4.4 3.3*  NEUTROABS 4.3  --   --   HGB 9.7* 8.1* 7.9*  HCT 28.4* 22.9* 22.4*  MCV 104.0* 102.7* 101.8*  PLT 67* 63* 55*   Basic Metabolic Panel: Recent Labs  Lab 07/13/18 1810 07/13/18 2005 07/14/18 0514 07/15/18 0507  NA 137  --  141 139  K 2.2* 2.7* 2.9* 3.4*  CL 101  --  113* 110  CO2 22  --  21* 19*  GLUCOSE 116*  --  117* 164*  BUN 7  --  5* 6  CREATININE 1.19*  --  0.90 0.75  CALCIUM 9.5  --  8.8* 8.3*  MG  --  1.5* 2.3  --   PHOS  --  2.2*  --  2.8   GFR: Estimated Creatinine Clearance: 71.2 mL/min (by C-G formula based on SCr of 0.75 mg/dL). Liver Function Tests: Recent Labs  Lab 07/13/18 1810 07/15/18 0507  AST 63* 52*  ALT 31 26  ALKPHOS 155* 125  BILITOT 4.7* 4.2*  PROT 8.8* 6.9  ALBUMIN 3.8 3.0*   Recent Labs  Lab 07/13/18 1810 07/15/18 0507  LIPASE 67* 94*   No results for input(s): AMMONIA in the last 168 hours. Coagulation Profile: No results for input(s): INR, PROTIME in the last 168 hours. Cardiac  Enzymes: No results for input(s): CKTOTAL, CKMB, CKMBINDEX, TROPONINI in the last 168 hours. BNP (last 3 results) No results for input(s): PROBNP in the last 8760 hours. HbA1C: No results for input(s): HGBA1C in the last 72 hours. CBG: No results for input(s): GLUCAP in the last 168 hours. Lipid Profile: Recent Labs    07/14/18 0514  CHOL 338*  HDL 40*  LDLCALC 252*  TRIG 230*  CHOLHDL 8.5   Thyroid Function Tests: Recent Labs    07/14/18 0514  TSH 1.410   Anemia Panel: No results for input(s): VITAMINB12, FOLATE, FERRITIN, TIBC, IRON, RETICCTPCT in the last 72 hours. Sepsis Labs: No results for input(s): PROCALCITON, LATICACIDVEN in the last 168 hours.  Recent Results (from the past 240 hour(s))  Gastrointestinal Panel by PCR , Stool      Status: None   Collection Time: 07/14/18 11:02 AM  Result Value Ref Range Status   Campylobacter species NOT DETECTED NOT DETECTED Final   Plesimonas shigelloides NOT DETECTED NOT DETECTED Final   Salmonella species NOT DETECTED NOT DETECTED Final   Yersinia enterocolitica NOT DETECTED NOT DETECTED Final   Vibrio species NOT DETECTED NOT DETECTED Final   Vibrio cholerae NOT DETECTED NOT DETECTED Final   Enteroaggregative E coli (EAEC) NOT DETECTED NOT DETECTED Final   Enteropathogenic E coli (EPEC) NOT DETECTED NOT DETECTED Final   Enterotoxigenic E coli (ETEC) NOT DETECTED NOT DETECTED Final   Shiga like toxin producing E coli (STEC) NOT DETECTED NOT DETECTED Final   Shigella/Enteroinvasive E coli (EIEC) NOT DETECTED NOT DETECTED Final   Cryptosporidium NOT DETECTED NOT DETECTED Final   Cyclospora cayetanensis NOT DETECTED NOT DETECTED Final   Entamoeba histolytica NOT DETECTED NOT DETECTED Final   Giardia lamblia NOT DETECTED NOT DETECTED Final   Adenovirus F40/41 NOT DETECTED NOT DETECTED Final   Astrovirus NOT DETECTED NOT DETECTED Final   Norovirus GI/GII NOT DETECTED NOT DETECTED Final   Rotavirus A NOT DETECTED NOT DETECTED Final   Sapovirus (I, II, IV, and V) NOT DETECTED NOT DETECTED Final    Comment: Performed at [facility], [address], [place], [place] [address]     Radiology Studies: Ct Abdomen Pelvis W Contrast  Result Date: 07/15/2018 CLINICAL DATA:  Acute epigastric abdominal pain. EXAM: CT ABDOMEN AND PELVIS WITH CONTRAST TECHNIQUE: Multidetector CT imaging of the abdomen and pelvis was performed using the standard protocol following bolus administration of intravenous contrast. CONTRAST:  157m OMNIPAQUE IOHEXOL 300 MG/ML  SOLN COMPARISON:  CT scan of July 03, 2018. FINDINGS: Lower chest: No acute abnormality. Hepatobiliary: Status post cholecystectomy. Nodular hepatic contours are noted consistent with pancreatitis. No focal hepatic abnormality  is noted. No biliary dilatation is noted. Pancreas: Unremarkable. No pancreatic ductal dilatation or surrounding inflammatory changes. Spleen: Moderate splenomegaly is noted. Adrenals/Urinary Tract: Adrenal glands are unremarkable. Left renal atrophy is again noted. Right kidney and ureter appear normal. Urinary bladder appears normal. Stomach/Bowel: The stomach appears normal. The appendix appears normal. Wall thickening of the right and transverse colon is noted consistent with infectious or inflammatory colitis. Vascular/Lymphatic: No significant adenopathy is noted. Enlarged collateral veins are noted between the liver and spleen consistent with portal hypertension. Reproductive: Uterus and bilateral adnexa are unremarkable. Other: No abdominal wall hernia or abnormality. No abdominopelvic ascites. Musculoskeletal: No acute or significant osseous findings. IMPRESSION: Findings consistent with infectious or inflammatory colitis involving the right and transverse colon. Hepatic cirrhosis is noted with moderate splenomegaly and collateral veins present consistent with portal hypertension. Electronically  Signed   By: Marijo Conception, M.D.   On: 07/15/2018 11:09    Scheduled Meds: . lipase/protease/amylase  36,000 Units Oral TID AC  . nicotine  14 mg Transdermal Daily  . phosphorus  500 mg Oral BID  . pregabalin  50 mg Oral TID  . sodium chloride flush  3 mL Intravenous Q12H  . sucralfate  1 g Oral TID WC & HS   Continuous Infusions: . sodium chloride 1,000 mL (07/15/18 1632)  . sodium chloride 10 mL/hr at 07/13/18 1850     LOS: 1 day   Marylu Lund, MD [facility] Pager On Amion  If 7PM-7AM, please contact night-coverage 07/15/2018, 6:25 PM

## 2018-07-15 NOTE — Plan of Care (Signed)
Pt appears calm and comfortable.

## 2018-07-15 NOTE — Plan of Care (Signed)
Patient continues to c/o abdominal pain, reports 8 stools on 7 a to 7 p shift.  Zofran given for nausea with some mild improvement.  Patient ate one meal this shift after CT scan.  Sister and significant other at bedside.

## 2018-07-16 DIAGNOSIS — D61818 Other pancytopenia: Secondary | ICD-10-CM

## 2018-07-16 DIAGNOSIS — R161 Splenomegaly, not elsewhere classified: Secondary | ICD-10-CM

## 2018-07-16 LAB — CLOSTRIDIUM DIFFICILE BY PCR, REFLEXED: CDIFFPCR: NEGATIVE

## 2018-07-16 LAB — BASIC METABOLIC PANEL
ANION GAP: 10 (ref 5–15)
BUN: 8 mg/dL (ref 6–20)
CALCIUM: 8.8 mg/dL — AB (ref 8.9–10.3)
CHLORIDE: 113 mmol/L — AB (ref 98–111)
CO2: 20 mmol/L — AB (ref 22–32)
Creatinine, Ser: 0.69 mg/dL (ref 0.44–1.00)
GFR calc non Af Amer: >60 mL/min (ref >60)
GLUCOSE: 144 mg/dL — AB (ref 70–99)
POTASSIUM: 2.8 mmol/L — AB (ref 3.5–5.1)
Sodium: 143 mmol/L (ref 135–145)

## 2018-07-16 LAB — C DIFFICILE QUICK SCREEN W PCR REFLEX
C Diff antigen: POSITIVE — AB
C Diff toxin: NEGATIVE

## 2018-07-16 LAB — CBC
HEMATOCRIT: 21.2 % — AB (ref 36.0–46.0)
HEMOGLOBIN: 7.5 g/dL — AB (ref 12.0–15.0)
MCH: 36.2 pg — AB (ref 26.0–34.0)
MCHC: 35.4 g/dL (ref 30.0–36.0)
MCV: 102.4 fL — ABNORMAL HIGH (ref 78.0–100.0)
Platelets: 48 10*3/uL — ABNORMAL LOW (ref 150–400)
RBC: 2.07 MIL/uL — AB (ref 3.87–5.11)
RDW: 17.6 % — ABNORMAL HIGH (ref 11.5–15.5)
WBC: 5.5 10*3/uL (ref 4.0–10.5)

## 2018-07-16 MED ORDER — DICYCLOMINE HCL 10 MG PO CAPS
10.0000 mg | ORAL_CAPSULE | Freq: Four times a day (QID) | ORAL | Status: DC | PRN
  Administered 2018-07-16 – 2018-07-18 (×6): 10 mg via ORAL
  Filled 2018-07-16 (×6): qty 1

## 2018-07-16 MED ORDER — POTASSIUM CHLORIDE CRYS ER 20 MEQ PO TBCR
40.0000 meq | EXTENDED_RELEASE_TABLET | Freq: Two times a day (BID) | ORAL | Status: AC
  Administered 2018-07-16 (×2): 40 meq via ORAL
  Filled 2018-07-16 (×2): qty 2

## 2018-07-16 NOTE — Discharge Instructions (Signed)
Sucralfate oral suspension What is this medicine? SUCRALFATE (SOO kral fate) helps to treat ulcers of the intestine. This medicine may be used for other purposes; ask your health care provider or pharmacist if you have questions. COMMON BRAND NAME(S): Carafate What should I tell my health care provider before I take this medicine? They need to know if you have any of these conditions: -kidney disease -an unusual or allergic reaction to sucralfate, other medicines, foods, dyes, or preservatives -pregnant or trying to get pregnant -breast-feeding How should I use this medicine? Take this medicine by mouth with a glass of water. Follow the directions on the prescription label. Shake well before using. Use a specially marked spoon or container to measure your medicine. Ask your pharmacist if you do not have one. Household spoons are not accurate. This medicine works best if you take it on an empty stomach, 1 hour before meals. Take your doses at regular intervals. Do not take your medicine more often than directed. Do not stop taking except on your doctor's advice. Talk to your pediatrician regarding the use of this medicine in children. Special care may be needed. Overdosage: If you think you have taken too much of this medicine contact a poison control center or emergency room at once. NOTE: This medicine is only for you. Do not share this medicine with others. What if I miss a dose? If you miss a dose, take it as soon as you can. If it is almost time for your next dose, take only that dose. Do not take double or extra doses. What may interact with this medicine? -antacid -cimetidine -digoxin -ketoconazole -phenytoin -quinidine -ranitidine -some antibiotics like ciprofloxacin, norfloxacin, and ofloxacin -theophylline -thyroid hormones -warfarin This list may not describe all possible interactions. Give your health care provider a list of all the medicines, herbs, non-prescription drugs,  or dietary supplements you use. Also tell them if you smoke, drink alcohol, or use illegal drugs. Some items may interact with your medicine. What should I watch for while using this medicine? Visit your doctor or health care professional for regular check ups. Let your doctor know if your symptoms do not improve or if you feel worse. Antacids should not be taken within one half hour before or after this medicine. What side effects may I notice from receiving this medicine? Side effects that you should report to your doctor or health care professional as soon as possible: -allergic reactions like skin rash, itching or hives, swelling of the face, lips, or tongue -difficulty breathing Side effects that usually do not require medical attention (report to your doctor or health care professional if they continue or are bothersome): -back pain -constipation -drowsy, dizzy -dry mouth -headache -stomach upset, gas -trouble sleeping This list may not describe all possible side effects. Call your doctor for medical advice about side effects. You may report side effects to FDA at 1-800-FDA-1088. Where should I keep my medicine? Keep out of the reach of children. Store at room temperature between 20 and 25 degrees C (68 and 77 degrees F). Keep container tightly closed. Throw away any unused medicine after the expiration date. NOTE: This sheet is a summary. It may not cover all possible information. If you have questions about this medicine, talk to your doctor, pharmacist, or health care provider.  2018 Elsevier/Gold Standard (2008-06-07 15:47:18)

## 2018-07-16 NOTE — Plan of Care (Signed)
Pt still experiencing nausea and needing antiemetics

## 2018-07-16 NOTE — Progress Notes (Signed)
Progress Note   Subjective  Chief Complaint: Diarrhea, abdominal pain, nausea and vomiting  CT showing infectious versus inflammatory colitis of the right and transverse colon.  Patient tells me she had a rough night and was awake all night long with episodes of abdominal pain which resulted in loose stools as well as some nausea and vomiting.  Tells me her pain medication is just not working long enough.   Objective   Vital signs in last 24 hours: Temp:  [97.9 F (36.6 C)-98.3 F (36.8 C)] 97.9 F (36.6 C) (09/27 6160) Pulse Rate:  [61-72] 61 (09/27 0608) Resp:  [18] 18 (09/27 7371) BP: (135-144)/(59-69) 136/64 (09/27 0626) SpO2:  [100 %] 100 % (09/27 0608) Last BM Date: 07/15/18 General:    white female in NAD Heart:  Regular rate and rhythm; no murmurs Lungs: Respirations even and unlabored, lungs CTA bilaterally Abdomen:  Soft, mild upper abdominal pain and nondistended. Normal bowel sounds. Extremities:  Without edema. Neurologic:  Alert and oriented,  grossly normal neurologically. Psych:  Cooperative. Normal mood and affect.  Intake/Output from previous day: 09/26 0701 - 09/27 0700 In: 2075.6 [I.V.:2075.6] Out: 4 [Stool:4]  Lab Results: Recent Labs    07/14/18 0514 07/15/18 0507 07/16/18 0536  WBC 4.4 3.3* 5.5  HGB 8.1* 7.9* 7.5*  HCT 22.9* 22.4* 21.2*  PLT 63* 55* 48*   BMET Recent Labs    07/14/18 0514 07/15/18 0507 07/16/18 0536  NA 141 139 143  K 2.9* 3.4* 2.8*  CL 113* 110 113*  CO2 21* 19* 20*  GLUCOSE 117* 164* 144*  BUN 5* 6 8  CREATININE 0.90 0.75 0.69  CALCIUM 8.8* 8.3* 8.8*   LFT Recent Labs    07/15/18 0507  PROT 6.9  ALBUMIN 3.0*  AST 52*  ALT 26  ALKPHOS 125  BILITOT 4.2*   Studies/Results: Ct Abdomen Pelvis W Contrast  Result Date: 07/15/2018 CLINICAL DATA:  Acute epigastric abdominal pain. EXAM: CT ABDOMEN AND PELVIS WITH CONTRAST TECHNIQUE: Multidetector CT imaging of the abdomen and pelvis was performed using the  standard protocol following bolus administration of intravenous contrast. CONTRAST:  189mL OMNIPAQUE IOHEXOL 300 MG/ML  SOLN COMPARISON:  CT scan of July 03, 2018. FINDINGS: Lower chest: No acute abnormality. Hepatobiliary: Status post cholecystectomy. Nodular hepatic contours are noted consistent with pancreatitis. No focal hepatic abnormality is noted. No biliary dilatation is noted. Pancreas: Unremarkable. No pancreatic ductal dilatation or surrounding inflammatory changes. Spleen: Moderate splenomegaly is noted. Adrenals/Urinary Tract: Adrenal glands are unremarkable. Left renal atrophy is again noted. Right kidney and ureter appear normal. Urinary bladder appears normal. Stomach/Bowel: The stomach appears normal. The appendix appears normal. Wall thickening of the right and transverse colon is noted consistent with infectious or inflammatory colitis. Vascular/Lymphatic: No significant adenopathy is noted. Enlarged collateral veins are noted between the liver and spleen consistent with portal hypertension. Reproductive: Uterus and bilateral adnexa are unremarkable. Other: No abdominal wall hernia or abnormality. No abdominopelvic ascites. Musculoskeletal: No acute or significant osseous findings. IMPRESSION: Findings consistent with infectious or inflammatory colitis involving the right and transverse colon. Hepatic cirrhosis is noted with moderate splenomegaly and collateral veins present consistent with portal hypertension. Electronically Signed   By: Marijo Conception, M.D.   On: 07/15/2018 11:09    Assessment / Plan:   Assessment: 1.  Abdominal pain: Continues per patient mostly in the upper abdomen, worse overnight with cramping resulting in his stools 2.  Nausea/vomiting/diarrhea: Continues overnight, GI pathogen panel negative, C.  difficile negative, CT showed inflammatory colitis 3.  Liver cirrhosis with splenomegaly 4.  Prolonged QT interval 5.  Hypokalemia 6.  Pancytopenia  Plan: 1.  We  will recheck C. difficile today, also ordered fecal pancreatic elastase 2.  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  Please await further recommendations from Dr. Rush Landmark later today  Thank you for your kind consultation, we will continue to follow.   LOS: 2 days   Levin Erp  07/16/2018, 10:13 AM

## 2018-07-16 NOTE — Progress Notes (Signed)
PROGRESS NOTE    Jamie Allen  UUV:[id number] DOB: October 12, 1971 DOA: 07/13/2018 PCP: Ladell Pier, MD    Brief Narrative:  47 y.o. female with medical history significant of significant ofliver cirrhosis, alcoholism, previous C. difficile colitis, hypothyroidism, pancytopenia, anxiety, and chronic abdominal pain; who presents with complaints of abdominal pain with nausea, vomiting, and diarrhea over the last 4 days.  Pain is mostly epigastric in nature constant, but also notes pain in the lower quadrants that she relates to her spleen being enlarged.  She has had several episodes of emesis and diarrhea both were noted to be yellow in color.  She denies any use of alcohol.  Associated symptoms include generalized malaise, weakness, and palpitations.  Patient reports that she has not been taking potassium pills due to unclear instructions and the fact that they make her nauseous and vomit.  She has a history of hypothyroidism, but reports that she has not been on the medication for she has a follow-up back up with her primary care provider.  Patient had just been hospitalized from 9/14-9/15 with similar symptoms thought to be related to pancreatitis.  However, patient left against Medical advice after she started feeling better after initial therapies.  C. difficile studies at that time were noted to be negative.  Upon admission into the emergency department patient was seen to be afebrile, pulse 72-1 06, respirations 16-22, blood pressure 134/92-140 5/72, and O2 saturations 100%. Labs revealed hemoglobin 9.7, platelets 67, potassium 2.2, BUN 7, creatinine 1.19, lipase 67, and AST 63. Patient was given 20 mEq of potassium chloride IV, 40 mEq p.o. of potassium chloride,Zofran,Phenergan, and IV fluids. [facility] called to admit  Assessment & Plan:   Principal Problem:   Abdominal pain Active Problems:   Anxiety and depression   Hypomagnesemia   Pancytopenia (HCC)   Non-intractable  vomiting with nausea   Hypokalemia   Prolonged Q-T interval on ECG   Chronic pancreatitis (HCC)   Renal insufficiency   Pancreatitis  Abdominal pain, pancreatitis: Acute on chronic likely secondary to pancreatitis - .  Patient presents with complaints of abdominal pain lipase elevated at 67. Recent CT findings suggestive of pancreatitis vs enteritis - Continue Creon as tolerated -Will need continued IVF hydration per below -Lipase higher today, denies abd pain currently  Nausea, vomiting, diarrhea:  -Likely related to above pancreatitis -CT with findings suggesting possible enteritis.  -Recent Cdiff study neg. -Have ordered stool study, pending results -Will hold off on empiric abx given significant past hx of cdiff colitis requiring stool transplant -Still having significant diarrhea thus will likely result in potentially life-threatening dehydration, thus will continue with IVF hydration inpatient -Cdiff recently neg. Repeat Cdiff indeterminate, PCR pending Stool studies neg. Cont to hold off abx per GI given extensive Cdiff hx -Bentyl ordered by GI  Prolonged QT interval:  - Acute. Initial QTc 516 on admission. - Correct electrolyte abnormality - Hold QT prolonging medications - will repeat EKG  Hypokalemia:  -Presenting K of 2.2 noted -Likely secondary to nausea/vomiting prior to admit -Replacement given. -repeat bmet in AM  Hypomagnesia:  - Initial magnesium level noted to be 1.5 on admission. - Given 2 g of magnesium sulfate - Mg replacement given  Liver cirrhosis with splenomegaly:  -Patient with history of liver disease related with history of alcoholism. -Recommend continued ETOH cessation  -Ultrasound from 8/28 revealed notes that splenic volume was increased from 1325cm to 1447 cm.   -Patient originally planned to follow-up with surgery to have a  splenectomy.   - Continue outpatient follow-up with general surgery for need of splenectomy -Presently  stable  Pancytopenia:  -Presenting hemoglobin 9.7 and platelet count 67.   -Suspecting cirrhosis as possible contributing factor in thrombocytopenia -Patient had been followed by Dr. Marin Olp and receives intermittent blood transfusions. - hgb 7.5 today. Will repeat CBC in AM  Hypothyroidism:  -Patient reports previously being on levothyroxine, but has not been on this medication due to needing to follow-up with her primary care provider. - TSH reviewed, within normal limits -Currently stable at present  Renal insufficiency:  -Patient presents with creatinine 1.9 on admission.  Baseline creatinine previously noted to be her within normal limits in August. - Likely secondary to dehydration related to vomiting and diarrhea -resolved with IVF hydration -Repeat bmet in AM  GERD - Held PPIs due to prolonged QT interval also hx of cdiff colitis - Continue Carafate - Presently stable  DVT prophylaxis: SCD's Code Status: Full Family Communication: Pt in room, family at bedside Disposition Plan: Uncertain at this time  Consultants:   [facility]  Procedures:     Antimicrobials: Anti-infectives (From admission, onward)   None      Subjective: Reports continued diarrhea and R lower quadrant tenderness  Objective: Vitals:   07/15/18 2227 07/16/18 0608 07/16/18 1340 07/16/18 1500  BP: (!) 135/59 136/64 (!) 147/98   Pulse: 62 61 (!) 120 84  Resp: 18 18 20    Temp: 98.3 F (36.8 C) 97.9 F (36.6 C)    TempSrc: Oral Oral    SpO2: 100% 100% 100%   Weight:      Height:        Intake/Output Summary (Last 24 hours) at 07/16/2018 1550 Last data filed at 07/16/2018 0200 Gross per 24 hour  Intake 1300.93 ml  Output -  Net 1300.93 ml   Filed Weights   07/13/18 1645  Weight: 58.1 kg    Examination: General exam: Conversant, in no acute distress Respiratory system: normal chest rise, clear, no audible wheezing Cardiovascular system: regular rhythm,  s1-s2 Gastrointestinal system: pos BS, general tenderness, mildly distended Central nervous system: No seizures, no tremors Extremities: No cyanosis, no joint deformities Skin: No rashes, no pallor Psychiatry: Affect normal // no auditory hallucinations    Data Reviewed: I have personally reviewed following labs and imaging studies  CBC: Recent Labs  Lab 07/13/18 1810 07/14/18 0514 07/15/18 0507 07/16/18 0536  WBC 5.8 4.4 3.3* 5.5  NEUTROABS 4.3  --   --   --   HGB 9.7* 8.1* 7.9* 7.5*  HCT 28.4* 22.9* 22.4* 21.2*  MCV 104.0* 102.7* 101.8* 102.4*  PLT 67* 63* 55* 48*   Basic Metabolic Panel: Recent Labs  Lab 07/13/18 1810 07/13/18 2005 07/14/18 0514 07/15/18 0507 07/16/18 0536  NA 137  --  141 139 143  K 2.2* 2.7* 2.9* 3.4* 2.8*  CL 101  --  113* 110 113*  CO2 22  --  21* 19* 20*  GLUCOSE 116*  --  117* 164* 144*  BUN 7  --  5* 6 8  CREATININE 1.19*  --  0.90 0.75 0.69  CALCIUM 9.5  --  8.8* 8.3* 8.8*  MG  --  1.5* 2.3  --   --   PHOS  --  2.2*  --  2.8  --    GFR: Estimated Creatinine Clearance: 71.2 mL/min (by C-G formula based on SCr of 0.69 mg/dL). Liver Function Tests: Recent Labs  Lab 07/13/18 1810 07/15/18 0507  AST  63* 52*  ALT 31 26  ALKPHOS 155* 125  BILITOT 4.7* 4.2*  PROT 8.8* 6.9  ALBUMIN 3.8 3.0*   Recent Labs  Lab 07/13/18 1810 07/15/18 0507  LIPASE 67* 94*   No results for input(s): AMMONIA in the last 168 hours. Coagulation Profile: No results for input(s): INR, PROTIME in the last 168 hours. Cardiac Enzymes: No results for input(s): CKTOTAL, CKMB, CKMBINDEX, TROPONINI in the last 168 hours. BNP (last 3 results) No results for input(s): PROBNP in the last 8760 hours. HbA1C: No results for input(s): HGBA1C in the last 72 hours. CBG: No results for input(s): GLUCAP in the last 168 hours. Lipid Profile: Recent Labs    07/14/18 0514  CHOL 338*  HDL 40*  LDLCALC 252*  TRIG 230*  CHOLHDL 8.5   Thyroid Function  Tests: Recent Labs    07/14/18 0514  TSH 1.410   Anemia Panel: No results for input(s): VITAMINB12, FOLATE, FERRITIN, TIBC, IRON, RETICCTPCT in the last 72 hours. Sepsis Labs: No results for input(s): PROCALCITON, LATICACIDVEN in the last 168 hours.  Recent Results (from the past 240 hour(s))  Gastrointestinal Panel by PCR , Stool     Status: None   Collection Time: 07/14/18 11:02 AM  Result Value Ref Range Status   Campylobacter species NOT DETECTED NOT DETECTED Final   Plesimonas shigelloides NOT DETECTED NOT DETECTED Final   Salmonella species NOT DETECTED NOT DETECTED Final   Yersinia enterocolitica NOT DETECTED NOT DETECTED Final   Vibrio species NOT DETECTED NOT DETECTED Final   Vibrio cholerae NOT DETECTED NOT DETECTED Final   Enteroaggregative E coli (EAEC) NOT DETECTED NOT DETECTED Final   Enteropathogenic E coli (EPEC) NOT DETECTED NOT DETECTED Final   Enterotoxigenic E coli (ETEC) NOT DETECTED NOT DETECTED Final   Shiga like toxin producing E coli (STEC) NOT DETECTED NOT DETECTED Final   Shigella/Enteroinvasive E coli (EIEC) NOT DETECTED NOT DETECTED Final   Cryptosporidium NOT DETECTED NOT DETECTED Final   Cyclospora cayetanensis NOT DETECTED NOT DETECTED Final   Entamoeba histolytica NOT DETECTED NOT DETECTED Final   Giardia lamblia NOT DETECTED NOT DETECTED Final   Adenovirus F40/41 NOT DETECTED NOT DETECTED Final   Astrovirus NOT DETECTED NOT DETECTED Final   Norovirus GI/GII NOT DETECTED NOT DETECTED Final   Rotavirus A NOT DETECTED NOT DETECTED Final   Sapovirus (I, II, IV, and V) NOT DETECTED NOT DETECTED Final    Comment: Performed at [facility], [address] [place], [place] [address]  C difficile quick scan w PCR reflex     Status: Abnormal   Collection Time: 07/16/18 11:15 AM  Result Value Ref Range Status   C Diff antigen POSITIVE (A) NEGATIVE Final   C Diff toxin NEGATIVE NEGATIVE Final   C Diff interpretation Results are indeterminate.  See PCR results.  Final    Comment: Performed at [facility], [place] [address] [place], [place] [address]  C. Diff by PCR, Reflexed     Status: None   Collection Time: 07/16/18 11:15 AM  Result Value Ref Range Status   Toxigenic C. Difficile by PCR NEGATIVE NEGATIVE Final    Comment: Patient is colonized with non toxigenic C. difficile. May not need treatment unless significant symptoms are present. Performed at [facility], [place] [address], [place], [place] [address]      Radiology Studies: Ct Abdomen Pelvis W Contrast  Result Date: 07/15/2018 CLINICAL DATA:  Acute epigastric abdominal pain. EXAM: CT ABDOMEN AND  PELVIS WITH CONTRAST TECHNIQUE: Multidetector CT imaging of the abdomen and pelvis was performed using the standard protocol following bolus administration of intravenous contrast. CONTRAST:  138m OMNIPAQUE IOHEXOL 300 MG/ML  SOLN COMPARISON:  CT scan of July 03, 2018. FINDINGS: Lower chest: No acute abnormality. Hepatobiliary: Status post cholecystectomy. Nodular hepatic contours are noted consistent with pancreatitis. No focal hepatic abnormality is noted. No biliary dilatation is noted. Pancreas: Unremarkable. No pancreatic ductal dilatation or surrounding inflammatory changes. Spleen: Moderate splenomegaly is noted. Adrenals/Urinary Tract: Adrenal glands are unremarkable. Left renal atrophy is again noted. Right kidney and ureter appear normal. Urinary bladder appears normal. Stomach/Bowel: The stomach appears normal. The appendix appears normal. Wall thickening of the right and transverse colon is noted consistent with infectious or inflammatory colitis. Vascular/Lymphatic: No significant adenopathy is noted. Enlarged collateral veins are noted between the liver and spleen consistent with portal hypertension. Reproductive: Uterus and bilateral adnexa are unremarkable. Other: No abdominal wall hernia or abnormality. No abdominopelvic ascites. Musculoskeletal:  No acute or significant osseous findings. IMPRESSION: Findings consistent with infectious or inflammatory colitis involving the right and transverse colon. Hepatic cirrhosis is noted with moderate splenomegaly and collateral veins present consistent with portal hypertension. Electronically Signed   By: JMarijo Conception M.D.   On: 07/15/2018 11:09    Scheduled Meds: . lipase/protease/amylase  36,000 Units Oral TID AC  . nicotine  14 mg Transdermal Daily  . potassium chloride  40 mEq Oral BID  . pregabalin  50 mg Oral TID  . sodium chloride flush  3 mL Intravenous Q12H  . sucralfate  1 g Oral TID WC & HS   Continuous Infusions: . sodium chloride 1,000 mL (07/16/18 0306)  . sodium chloride 10 mL/hr at 07/13/18 1850     LOS: 2 days   SMarylu Lund MD [facility] Pager On Amion  If 7PM-7AM, please contact night-coverage 07/16/2018, 3:50 PM

## 2018-07-17 LAB — CBC
HEMATOCRIT: 22 % — AB (ref 36.0–46.0)
Hemoglobin: 7.5 g/dL — ABNORMAL LOW (ref 12.0–15.0)
MCH: 35.9 pg — AB (ref 26.0–34.0)
MCHC: 34.1 g/dL (ref 30.0–36.0)
MCV: 105.3 fL — ABNORMAL HIGH (ref 78.0–100.0)
PLATELETS: 53 10*3/uL — AB (ref 150–400)
RBC: 2.09 MIL/uL — ABNORMAL LOW (ref 3.87–5.11)
RDW: 18.2 % — AB (ref 11.5–15.5)
WBC: 4.5 10*3/uL (ref 4.0–10.5)

## 2018-07-17 LAB — SEDIMENTATION RATE: Sed Rate: 27 mm/hr — ABNORMAL HIGH (ref 0–22)

## 2018-07-17 LAB — COMPREHENSIVE METABOLIC PANEL
ALBUMIN: 2.9 g/dL — AB (ref 3.5–5.0)
ALK PHOS: 106 U/L (ref 38–126)
ALT: 31 U/L (ref 0–44)
AST: 62 U/L — AB (ref 15–41)
Anion gap: 6 (ref 5–15)
BUN: 6 mg/dL (ref 6–20)
CALCIUM: 9 mg/dL (ref 8.9–10.3)
CHLORIDE: 120 mmol/L — AB (ref 98–111)
CO2: 18 mmol/L — AB (ref 22–32)
CREATININE: 0.84 mg/dL (ref 0.44–1.00)
GFR calc non Af Amer: >60 mL/min (ref >60)
GLUCOSE: 98 mg/dL (ref 70–99)
Potassium: 2.9 mmol/L — ABNORMAL LOW (ref 3.5–5.1)
SODIUM: 144 mmol/L (ref 135–145)
Total Bilirubin: 4.2 mg/dL — ABNORMAL HIGH (ref 0.3–1.2)
Total Protein: 6.2 g/dL — ABNORMAL LOW (ref 6.5–8.1)

## 2018-07-17 LAB — PHOSPHORUS: Phosphorus: 2.7 mg/dL (ref 2.5–4.6)

## 2018-07-17 LAB — C-REACTIVE PROTEIN: CRP: 2.9 mg/dL — ABNORMAL HIGH (ref <1.0)

## 2018-07-17 LAB — MAGNESIUM: Magnesium: 1.3 mg/dL — ABNORMAL LOW (ref 1.7–2.4)

## 2018-07-17 MED ORDER — VANCOMYCIN 50 MG/ML ORAL SOLUTION: 125.0000 mg | ORAL | Status: DC

## 2018-07-17 MED ORDER — VANCOMYCIN 50 MG/ML ORAL SOLUTION: 125.0000 mg | Freq: Every day | ORAL | Status: DC

## 2018-07-17 MED ORDER — DIPHENOXYLATE-ATROPINE 2.5-0.025 MG PO TABS
1.0000 | ORAL_TABLET | Freq: Three times a day (TID) | ORAL | Status: DC | PRN
  Administered 2018-07-17 – 2018-07-18 (×3): 1 via ORAL
  Filled 2018-07-17 (×5): qty 1

## 2018-07-17 MED ORDER — VANCOMYCIN 50 MG/ML ORAL SOLUTION
125.0000 mg | Freq: Four times a day (QID) | ORAL | Status: DC
  Administered 2018-07-17 – 2018-07-22 (×20): 125 mg via ORAL
  Filled 2018-07-17 (×22): qty 2.5

## 2018-07-17 MED ORDER — HYDROMORPHONE HCL 1 MG/ML IJ SOLN
1.0000 mg | INTRAMUSCULAR | Status: DC | PRN
  Administered 2018-07-17 – 2018-07-22 (×26): 1 mg via INTRAVENOUS
  Filled 2018-07-17 (×27): qty 1

## 2018-07-17 MED ORDER — POTASSIUM CHLORIDE 10 MEQ/100ML IV SOLN
10.0000 meq | Freq: Once | INTRAVENOUS | Status: AC
  Administered 2018-07-17: 10 meq via INTRAVENOUS

## 2018-07-17 MED ORDER — POTASSIUM CHLORIDE 10 MEQ/100ML IV SOLN
10.0000 meq | INTRAVENOUS | Status: AC
  Administered 2018-07-17 (×5): 10 meq via INTRAVENOUS
  Filled 2018-07-17 (×6): qty 100

## 2018-07-17 MED ORDER — DIPHENOXYLATE-ATROPINE 2.5-0.025 MG/5ML PO LIQD: 5.0000 mL | Freq: Three times a day (TID) | ORAL | Status: DC | PRN

## 2018-07-17 MED ORDER — CHOLESTYRAMINE 4 G PO PACK
4.0000 g | PACK | Freq: Two times a day (BID) | ORAL | Status: DC
  Administered 2018-07-17 – 2018-07-19 (×5): 4 g via ORAL
  Filled 2018-07-17 (×6): qty 1

## 2018-07-17 MED ORDER — MAGNESIUM SULFATE 4 GM/100ML IV SOLN
4.0000 g | Freq: Once | INTRAVENOUS | Status: AC
  Administered 2018-07-17: 4 g via INTRAVENOUS
  Filled 2018-07-17: qty 100

## 2018-07-17 MED ORDER — VANCOMYCIN 50 MG/ML ORAL SOLUTION: 125.0000 mg | Freq: Two times a day (BID) | ORAL | Status: DC

## 2018-07-17 NOTE — Progress Notes (Signed)
[facility] Subjective: 48 year old white female with alcoholic cirrhosis previous history of C. Difficile colitis hypothyroidism pancytopenia anxiety and chronic abdominal pain presented to the hospital for nausea vomiting and severe diarrhea pain is mostly in the epigastric region and she is having 05-30-11 bowel movements a day. C. Difficile toxin assay is negative but the antigen is positive. the C. Difficile PCR is pending. She's had over 20 bowel movements today that have been loose and watery. Patient seems exhausted from were sent from the severe diarrhea. Her CRP is elevated at 2.9.    Objective: Vital signs in last 24 hours: Temp:  [97.8 F (36.6 C)-98.1 F (36.7 C)] 97.8 F (36.6 C) (09/28 0459) Pulse Rate:  [84-120] 97 (09/28 0459) Resp:  [18-20] 18 (09/28 0459) BP: (117-147)/(69-98) 117/90 (09/28 0459) SpO2:  [100 %] 100 % (09/28 0459) Last BM Date: 07/16/18  Intake/Output from previous day: No intake/output data recorded. Intake/Output this shift: Total I/O In: 4226.9 [I.V.:4226.9] Out: -   General appearance: alert, cooperative, fatigued, moderate distress and pale Resp: clear to auscultation bilaterally Cardio: regular rate and rhythm, S1, S2 normal, no murmur, click, rub or gallop GI: soft, slightly distended with hyperactive bowel sounds; no masses,  no organomegaly Extremities: extremities normal, atraumatic, no cyanosis or edema  Lab Results: Recent Labs    07/15/18 0507 07/16/18 0536 07/17/18 0510  WBC 3.3* 5.5 4.5  HGB 7.9* 7.5* 7.5*  HCT 22.4* 21.2* 22.0*  PLT 55* 48* 53*   BMET Recent Labs    07/15/18 0507 07/16/18 0536 07/17/18 0510  NA 139 143 144  K 3.4* 2.8* 2.9*  CL 110 113* 120*  CO2 19* 20* 18*  GLUCOSE 164* 144* 98  BUN 6 8 6   CREATININE 0.75 0.69 0.84  CALCIUM 8.3* 8.8* 9.0   LFT Recent Labs    07/17/18 0510  PROT 6.2*  ALBUMIN 2.9*  AST 62*  ALT 31  ALKPHOS 106  BILITOT 4.2*   C-Diff Recent Labs     07/16/18 1115  CDIFFTOX NEGATIVE   Recent Labs    07/16/18 1115  CDIFFPCR NEGATIVE   Medications: I have reviewed the patient's current medications.  Assessment/Plan: 1) Severe diarrhea-stool studies indeterminate for C. Difficile infection with positive C. Difficile antigen and negative C. Difficile toxin assay C. Difficile PCR spending. As per my discussion with Dr. Karolee Ohs vancomycin was started this morning. Patient continues to have severe diarrhea and therefore Lomotil is being added along with cholestyramine again after discussion with ID. The pharmacy has been advised not to give the cholestyramine with other medication so as to avoid decreasing the bioavailability of other drugs. 2) Alcoholic cirrhosis with splenomegaly. 3) GERD. 4) Renal insufficiency.  5) Hypothyroidism.   LOS: 3 days   Shamra Bradeen 07/17/2018, 10:39 AM

## 2018-07-17 NOTE — Progress Notes (Signed)
PROGRESS NOTE    Jamie Allen  KCL:[id number] DOB: 1971/07/17 DOA: 07/13/2018 PCP: Ladell Pier, MD    Brief Narrative:  47 y.o. female with medical history significant of significant ofliver cirrhosis, alcoholism, previous C. difficile colitis, hypothyroidism, pancytopenia, anxiety, and chronic abdominal pain; who presents with complaints of abdominal pain with nausea, vomiting, and diarrhea over the last 4 days.  Pain is mostly epigastric in nature constant, but also notes pain in the lower quadrants that she relates to her spleen being enlarged.  She has had several episodes of emesis and diarrhea both were noted to be yellow in color.  She denies any use of alcohol.  Associated symptoms include generalized malaise, weakness, and palpitations.  Patient reports that she has not been taking potassium pills due to unclear instructions and the fact that they make her nauseous and vomit.  She has a history of hypothyroidism, but reports that she has not been on the medication for she has a follow-up back up with her primary care provider.  Patient had just been hospitalized from 9/14-9/15 with similar symptoms thought to be related to pancreatitis.  However, patient left against Medical advice after she started feeling better after initial therapies.  C. difficile studies at that time were noted to be negative.  Upon admission into the emergency department patient was seen to be afebrile, pulse 72-1 06, respirations 16-22, blood pressure 134/92-140 5/72, and O2 saturations 100%. Labs revealed hemoglobin 9.7, platelets 67, potassium 2.2, BUN 7, creatinine 1.19, lipase 67, and AST 63. Patient was given 20 mEq of potassium chloride IV, 40 mEq p.o. of potassium chloride,Zofran,Phenergan, and IV fluids. [facility] called to admit  Assessment & Plan:   Principal Problem:   Abdominal pain Active Problems:   Anxiety and depression   Hypomagnesemia   Pancytopenia (HCC)   Non-intractable  vomiting with nausea   Hypokalemia   Prolonged Q-T interval on ECG   Chronic pancreatitis (HCC)   Renal insufficiency   Pancreatitis  Abdominal pain, secondary to enteritis vs recurrent Cdiff colitis - Patient presents with complaints of abdominal pain lipase elevated at 67. Recent CT findings suggestive of pancreatitis vs enteritis - Continue Creon as tolerated -Will need continued IVF hydration per below -GI following. Orders for PO vanc ordered by GI -Pt is continued on enteric precautions - On further questioning, patient reports abd pain is to the point of "making me want to jump in front of a car." Patient used this statement in reference only to the degree of pain, NOT of suicidal ideation or intent. Patient has bluntly denied suicidal or homicidal ideations to me.  Nausea, vomiting, diarrhea:  -Likely related to above pancreatitis -CT with findings suggesting possible enteritis.  -Recent Cdiff study neg. -Have ordered stool study, neg -Repeat Cdiff study in progress. At present, indeterminiate -PO vanc ordered by GI -Continue IVF hydration as pt continues to have diarrhea  Prolonged QT interval:  - Acute. Initial QTc 516 on admission. - Correct electrolyte abnormality - Hold QT prolonging medications - QTc improve on follow up EKG, reviewed - Pt did report palpitations in setting of hypokalemia and hypomagnesemia, thus will continue on telemetry  Hypokalemia:  -Presenting K of 2.2 noted -Likely secondary to nausea/vomiting prior to admit -Replacement given. -Still low today. Will give IV potassium replacment  Hypomagnesia:  - Initial magnesium level noted to be 1.5 on admission. - Lower today, likely secondary to diarrhea -Will give more IV Mg replacement. Repeat Mg and bmet in AM, replace  lytes as needed  Liver cirrhosis with splenomegaly:  -Patient with history of liver disease related with history of alcoholism. -Recommend continued ETOH cessation    -Ultrasound from 8/28 revealed notes that splenic volume was increased from 1325cm to 1447 cm.   -Patient originally planned to follow-up with surgery to have a splenectomy.   - Continue outpatient follow-up with general surgery for need of splenectomy -Stable at present  Pancytopenia:  -Presenting hemoglobin 9.7 and platelet count 67.   -Suspecting cirrhosis as possible contributing factor in thrombocytopenia -Patient had been followed by Dr. Marin Olp and receives intermittent blood transfusions. - hgb 7.5 today. Will repeat CBC in AM  Hypothyroidism:  -Patient reports previously being on levothyroxine, but has not been on this medication due to needing to follow-up with her primary care provider. - TSH reviewed, within normal limits -stable currently  Renal insufficiency:  -Patient presents with creatinine 1.9 on admission.  Baseline creatinine previously noted to be her within normal limits in August. - Likely secondary to dehydration related to vomiting and diarrhea -resolved with IVF hydration -Will recheck bmet in AM  GERD - Held PPIs due to prolonged QT interval also hx of cdiff colitis - Continue Carafate - Stable at present  DVT prophylaxis: SCD's Code Status: Full Family Communication: Pt in room, family at bedside Disposition Plan: Uncertain at this time  Consultants:   [facility]  Procedures:     Antimicrobials: Anti-infectives (From admission, onward)   Start     Dose/Rate Route Frequency Ordered Stop   09/12/18 1000  vancomycin (VANCOCIN) 50 mg/mL oral solution 125 mg     125 mg Oral Every 3 DAYS 07/17/18 1207 10/12/18 0959   08/15/18 1000  vancomycin (VANCOCIN) 50 mg/mL oral solution 125 mg     125 mg Oral Every other day 07/17/18 1207 09/12/18 0959   08/08/18 1000  vancomycin (VANCOCIN) 50 mg/mL oral solution 125 mg     125 mg Oral Daily 07/17/18 1207 08/15/18 0959   07/31/18 2200  vancomycin (VANCOCIN) 50 mg/mL oral solution 125 mg     125 mg  Oral 2 times daily 07/17/18 1207 08/07/18 2159   07/17/18 1400  vancomycin (VANCOCIN) 50 mg/mL oral solution 125 mg     125 mg Oral 4 times daily 07/17/18 1207 07/31/18 1359      Subjective: Still with abd discomfort and diarrhea. Asking for more pain medicaitons  Objective: Vitals:   07/16/18 1500 07/16/18 2147 07/17/18 0459 07/17/18 1409  BP:  (!) 146/69 117/90 130/74  Pulse: 84 98 97 91  Resp:  20 18 20   Temp:  98.1 F (36.7 C) 97.8 F (36.6 C) 97.9 F (36.6 C)  TempSrc:  Oral Oral Oral  SpO2:  100% 100% 99%  Weight:      Height:        Intake/Output Summary (Last 24 hours) at 07/17/2018 1732 Last data filed at 07/17/2018 1000 Gross per 24 hour  Intake 4226.92 ml  Output -  Net 4226.92 ml   Filed Weights   07/13/18 1645  Weight: 58.1 kg    Examination: General exam: Awake, laying in bed, appears uncomfortable Respiratory system: Normal respiratory effort, no wheezing Cardiovascular system: regular rate, s1, s2 Gastrointestinal system: generally tender, pos BS Central nervous system: CN2-12 grossly intact, strength intact Extremities: Perfused, no clubbing Skin: Normal skin turgor, no notable skin lesions seen Psychiatry: Mood normal // no visual hallucinations    Data Reviewed: I have personally reviewed following labs and imaging studies  CBC: Recent Labs  Lab 07/13/18 1810 07/14/18 0514 07/15/18 0507 07/16/18 0536 07/17/18 0510  WBC 5.8 4.4 3.3* 5.5 4.5  NEUTROABS 4.3  --   --   --   --   HGB 9.7* 8.1* 7.9* 7.5* 7.5*  HCT 28.4* 22.9* 22.4* 21.2* 22.0*  MCV 104.0* 102.7* 101.8* 102.4* 105.3*  PLT 67* 63* 55* 48* 53*   Basic Metabolic Panel: Recent Labs  Lab 07/13/18 1810 07/13/18 2005 07/14/18 0514 07/15/18 0507 07/16/18 0536 07/17/18 0510  NA 137  --  141 139 143 144  K 2.2* 2.7* 2.9* 3.4* 2.8* 2.9*  CL 101  --  113* 110 113* 120*  CO2 22  --  21* 19* 20* 18*  GLUCOSE 116*  --  117* 164* 144* 98  BUN 7  --  5* 6 8 6   CREATININE 1.19*   --  0.90 0.75 0.69 0.84  CALCIUM 9.5  --  8.8* 8.3* 8.8* 9.0  MG  --  1.5* 2.3  --   --  1.3*  PHOS  --  2.2*  --  2.8  --  2.7   GFR: Estimated Creatinine Clearance: 67.8 mL/min (by C-G formula based on SCr of 0.84 mg/dL). Liver Function Tests: Recent Labs  Lab 07/13/18 1810 07/15/18 0507 07/17/18 0510  AST 63* 52* 62*  ALT 31 26 31   ALKPHOS 155* 125 106  BILITOT 4.7* 4.2* 4.2*  PROT 8.8* 6.9 6.2*  ALBUMIN 3.8 3.0* 2.9*   Recent Labs  Lab 07/13/18 1810 07/15/18 0507  LIPASE 67* 94*   No results for input(s): AMMONIA in the last 168 hours. Coagulation Profile: No results for input(s): INR, PROTIME in the last 168 hours. Cardiac Enzymes: No results for input(s): CKTOTAL, CKMB, CKMBINDEX, TROPONINI in the last 168 hours. BNP (last 3 results) No results for input(s): PROBNP in the last 8760 hours. HbA1C: No results for input(s): HGBA1C in the last 72 hours. CBG: No results for input(s): GLUCAP in the last 168 hours. Lipid Profile: No results for input(s): CHOL, HDL, LDLCALC, TRIG, CHOLHDL, LDLDIRECT in the last 72 hours. Thyroid Function Tests: No results for input(s): TSH, T4TOTAL, FREET4, T3FREE, THYROIDAB in the last 72 hours. Anemia Panel: No results for input(s): VITAMINB12, FOLATE, FERRITIN, TIBC, IRON, RETICCTPCT in the last 72 hours. Sepsis Labs: No results for input(s): PROCALCITON, LATICACIDVEN in the last 168 hours.  Recent Results (from the past 240 hour(s))  Gastrointestinal Panel by PCR , Stool     Status: None   Collection Time: 07/14/18 11:02 AM  Result Value Ref Range Status   Campylobacter species NOT DETECTED NOT DETECTED Final   Plesimonas shigelloides NOT DETECTED NOT DETECTED Final   Salmonella species NOT DETECTED NOT DETECTED Final   Yersinia enterocolitica NOT DETECTED NOT DETECTED Final   Vibrio species NOT DETECTED NOT DETECTED Final   Vibrio cholerae NOT DETECTED NOT DETECTED Final   Enteroaggregative E coli (EAEC) NOT DETECTED NOT  DETECTED Final   Enteropathogenic E coli (EPEC) NOT DETECTED NOT DETECTED Final   Enterotoxigenic E coli (ETEC) NOT DETECTED NOT DETECTED Final   Shiga like toxin producing E coli (STEC) NOT DETECTED NOT DETECTED Final   Shigella/Enteroinvasive E coli (EIEC) NOT DETECTED NOT DETECTED Final   Cryptosporidium NOT DETECTED NOT DETECTED Final   Cyclospora cayetanensis NOT DETECTED NOT DETECTED Final   Entamoeba histolytica NOT DETECTED NOT DETECTED Final   Giardia lamblia NOT DETECTED NOT DETECTED Final   Adenovirus F40/41 NOT DETECTED NOT DETECTED Final  Astrovirus NOT DETECTED NOT DETECTED Final   Norovirus GI/GII NOT DETECTED NOT DETECTED Final   Rotavirus A NOT DETECTED NOT DETECTED Final   Sapovirus (I, II, IV, and V) NOT DETECTED NOT DETECTED Final    Comment: Performed at [facility], [address] [place], [place] [address]  C difficile quick scan w PCR reflex     Status: Abnormal   Collection Time: 07/16/18 11:15 AM  Result Value Ref Range Status   C Diff antigen POSITIVE (A) NEGATIVE Final   C Diff toxin NEGATIVE NEGATIVE Final   C Diff interpretation Results are indeterminate. See PCR results.  Final    Comment: Performed at [facility], [place] [address] [place], [place] [address]  C. Diff by PCR, Reflexed     Status: None   Collection Time: 07/16/18 11:15 AM  Result Value Ref Range Status   Toxigenic C. Difficile by PCR NEGATIVE NEGATIVE Final    Comment: Patient is colonized with non toxigenic C. difficile. May not need treatment unless significant symptoms are present. Performed at [facility], [place] [address], [place],  [address]      Radiology Studies: No results found.  Scheduled Meds: . lipase/protease/amylase  36,000 Units Oral TID AC  . nicotine  14 mg Transdermal Daily  . pregabalin  50 mg Oral TID  . sodium chloride flush  3 mL Intravenous Q12H  . sucralfate  1 g Oral TID WC & HS  . vancomycin  125 mg Oral QID    Followed by  . [START ON 07/31/2018] vancomycin  125 mg Oral BID   Followed by  . [START ON 08/08/2018] vancomycin  125 mg Oral Daily   Followed by  . [START ON 08/15/2018] vancomycin  125 mg Oral QODAY   Followed by  . [START ON 09/12/2018] vancomycin  125 mg Oral Q3 days   Continuous Infusions: . sodium chloride 1,000 mL (07/17/18 0454)  . sodium chloride Stopped (07/17/18 1000)     LOS: 3 days   Marylu Lund, MD [facility] Pager On Amion  If 7PM-7AM, please contact night-coverage 07/17/2018, 5:32 PM

## 2018-07-18 LAB — COMPREHENSIVE METABOLIC PANEL
ALT: 24 U/L (ref 0–44)
AST: 43 U/L — ABNORMAL HIGH (ref 15–41)
Albumin: 2.8 g/dL — ABNORMAL LOW (ref 3.5–5.0)
Alkaline Phosphatase: 89 U/L (ref 38–126)
Anion gap: 6 (ref 5–15)
BILIRUBIN TOTAL: 4.3 mg/dL — AB (ref 0.3–1.2)
CO2: 16 mmol/L — ABNORMAL LOW (ref 22–32)
CREATININE: 0.76 mg/dL (ref 0.44–1.00)
Calcium: 8.5 mg/dL — ABNORMAL LOW (ref 8.9–10.3)
Chloride: 115 mmol/L — ABNORMAL HIGH (ref 98–111)
GFR calc Af Amer: >60 mL/min (ref >60)
GLUCOSE: 94 mg/dL (ref 70–99)
POTASSIUM: 3.1 mmol/L — AB (ref 3.5–5.1)
Sodium: 137 mmol/L (ref 135–145)
TOTAL PROTEIN: 6.2 g/dL — AB (ref 6.5–8.1)

## 2018-07-18 LAB — PREPARE RBC (CROSSMATCH)

## 2018-07-18 LAB — CBC
HCT: 20 % — ABNORMAL LOW (ref 36.0–46.0)
Hemoglobin: 6.9 g/dL — CL (ref 12.0–15.0)
MCH: 36.3 pg — ABNORMAL HIGH (ref 26.0–34.0)
MCHC: 34.5 g/dL (ref 30.0–36.0)
MCV: 105.3 fL — AB (ref 78.0–100.0)
Platelets: 46 10*3/uL — ABNORMAL LOW (ref 150–400)
RBC: 1.9 MIL/uL — ABNORMAL LOW (ref 3.87–5.11)
RDW: 17.4 % — AB (ref 11.5–15.5)
WBC: 4.4 10*3/uL (ref 4.0–10.5)

## 2018-07-18 LAB — MAGNESIUM: Magnesium: 1.8 mg/dL (ref 1.7–2.4)

## 2018-07-18 LAB — PHOSPHORUS: Phosphorus: 1.8 mg/dL — ABNORMAL LOW (ref 2.5–4.6)

## 2018-07-18 MED ORDER — POTASSIUM CHLORIDE 10 MEQ/100ML IV SOLN
10.0000 meq | Freq: Once | INTRAVENOUS | Status: AC
  Administered 2018-07-18: 10 meq via INTRAVENOUS
  Filled 2018-07-18: qty 100

## 2018-07-18 MED ORDER — SODIUM CHLORIDE 0.9% IV SOLUTION: Freq: Once | INTRAVENOUS | Status: DC

## 2018-07-18 MED ORDER — MAGNESIUM SULFATE 2 GM/50ML IV SOLN
2.0000 g | Freq: Once | INTRAVENOUS | Status: AC
  Administered 2018-07-18: 2 g via INTRAVENOUS
  Filled 2018-07-18: qty 50

## 2018-07-18 NOTE — Progress Notes (Signed)
PROGRESS NOTE    Jamie Allen  DXA:[id number] DOB: 04-05-1971 DOA: 07/13/2018 PCP: Ladell Pier, MD    Brief Narrative:  47 y.o. female with medical history significant of significant ofliver cirrhosis, alcoholism, previous C. difficile colitis, hypothyroidism, pancytopenia, anxiety, and chronic abdominal pain; who presents with complaints of abdominal pain with nausea, vomiting, and diarrhea over the last 4 days.  Pain is mostly epigastric in nature constant, but also notes pain in the lower quadrants that she relates to her spleen being enlarged.  She has had several episodes of emesis and diarrhea both were noted to be yellow in color.  She denies any use of alcohol.  Associated symptoms include generalized malaise, weakness, and palpitations.  Patient reports that she has not been taking potassium pills due to unclear instructions and the fact that they make her nauseous and vomit.  She has a history of hypothyroidism, but reports that she has not been on the medication for she has a follow-up back up with her primary care provider.  Patient had just been hospitalized from 9/14-9/15 with similar symptoms thought to be related to pancreatitis.  However, patient left against Medical advice after she started feeling better after initial therapies.  C. difficile studies at that time were noted to be negative.  Upon admission into the emergency department patient was seen to be afebrile, pulse 72-1 06, respirations 16-22, blood pressure 134/92-140 5/72, and O2 saturations 100%. Labs revealed hemoglobin 9.7, platelets 67, potassium 2.2, BUN 7, creatinine 1.19, lipase 67, and AST 63. Patient was given 20 mEq of potassium chloride IV, 40 mEq p.o. of potassium chloride,Zofran,Phenergan, and IV fluids. [facility] called to admit  Assessment & Plan:   Principal Problem:   Abdominal pain Active Problems:   Anxiety and depression   Hypomagnesemia   Pancytopenia (HCC)   Non-intractable  vomiting with nausea   Hypokalemia   Prolonged Q-T interval on ECG   Chronic pancreatitis (HCC)   Renal insufficiency   Pancreatitis  Abdominal pain, secondary to enteritis vs recurrent Cdiff colitis - Patient presents with complaints of abdominal pain lipase elevated at 67. Recent CT findings suggestive of pancreatitis vs enteritis - Continue Creon as tolerated -Will need continued IVF hydration per below -GI following. -Pt is continued on enteric precautions - Initial concerns of ?suicidal ideations. On further questioning, patient reports abd pain is to the point of "making me want to jump in front of a car." Patient used this statement in reference only to the degree of pain, NOT of suicidal ideation or intent. Patient has bluntly denied suicidal or homicidal ideations to me. -Cdiff studies neg -Per GI, continue PO vanc, cholestyramine, lomotil  Nausea, vomiting, diarrhea:  -Likely related to above enteritis -Recent Cdiff study neg. -Have ordered stool study, neg -Repeat Cdiff neg -PO vanc continued by GI -Continue IVF hydration as pt continues to have diarrhea  Prolonged QT interval:  - Acute. Initial QTc 516 on admission. - Correct electrolyte abnormality - Hold QT prolonging medications - QTc improve on follow up EKG, reviewed - Pt did report palpitations in setting of hypokalemia and hypomagnesemia, will continue on tele  Hypokalemia:  -Presenting K of 2.2 noted -Likely secondary to nausea/vomiting and diarrhea prior to admit -Replacement given. -Remains low. Will replace  Hypomagnesia:  - Initial magnesium level noted to be 1.5 on admission. - still low today -Will give more IV Mg replacement. Recheck Mg and bmet in AM, replace lytes as needed  Liver cirrhosis with splenomegaly:  -Patient  with history of liver disease related with history of alcoholism. -Recommend continued ETOH cessation  -Ultrasound from 8/28 revealed notes that splenic volume was  increased from 1325cm to 1447 cm.   -Patient originally planned to follow-up with surgery to have a splenectomy.   - Continue outpatient follow-up with general surgery for need of splenectomy -currently stable  Pancytopenia:  -Presenting hemoglobin 9.7 and platelet count 67.   -Suspecting cirrhosis as possible contributing factor in thrombocytopenia -Patient had been followed by Dr. Marin Olp and receives intermittent blood transfusions. - hgb down to 6.9 today. Give one unit PRBC -Repeat cbc in AM  Hypothyroidism:  -Patient reports previously being on levothyroxine, but has not been on this medication due to needing to follow-up with her primary care provider. - TSH reviewed, within normal limits -stable at present  Renal insufficiency:  -Patient presents with creatinine 1.9 on admission.  Baseline creatinine previously noted to be her within normal limits in August. - Likely secondary to dehydration related to vomiting and diarrhea -resolved with IVF hydration -repeat bmet in AM  GERD - Held PPIs due to prolonged QT interval also hx of cdiff colitis - Continue Carafate - currently stable  DVT prophylaxis: SCD's Code Status: Full Family Communication: Pt in room, family at bedside Disposition Plan: Uncertain at this time  Consultants:    GI  Procedures:     Antimicrobials: Anti-infectives (From admission, onward)   Start     Dose/Rate Route Frequency Ordered Stop   09/12/18 1000  vancomycin (VANCOCIN) 50 mg/mL oral solution 125 mg     125 mg Oral Every 3 DAYS 07/17/18 1207 10/12/18 0959   08/15/18 1000  vancomycin (VANCOCIN) 50 mg/mL oral solution 125 mg     125 mg Oral Every other day 07/17/18 1207 09/12/18 0959   08/08/18 1000  vancomycin (VANCOCIN) 50 mg/mL oral solution 125 mg     125 mg Oral Daily 07/17/18 1207 08/15/18 0959   07/31/18 2200  vancomycin (VANCOCIN) 50 mg/mL oral solution 125 mg     125 mg Oral 2 times daily 07/17/18 1207 08/07/18 2159    07/17/18 1400  vancomycin (VANCOCIN) 50 mg/mL oral solution 125 mg     125 mg Oral 4 times daily 07/17/18 1207 07/31/18 1359      Subjective: Reports multiple bouts of diarrhea overnight  Objective: Vitals:   07/17/18 2124 07/18/18 0447 07/18/18 1332 07/18/18 1347  BP: (!) 117/52 121/62 (!) 123/56 (!) 120/99  Pulse: 77 82 72 69  Resp: 18 18 18 18   Temp: 98 F (36.7 C) 98.7 F (37.1 C) 99.3 F (37.4 C) 99.7 F (37.6 C)  TempSrc: Oral Oral Oral Oral  SpO2: 100% 100% 100% 100%  Weight:      Height:        Intake/Output Summary (Last 24 hours) at 07/18/2018 1616 Last data filed at 07/18/2018 1332 Gross per 24 hour  Intake 1494.08 ml  Output -  Net 1494.08 ml   Filed Weights   07/13/18 1645  Weight: 58.1 kg    Examination: General exam: Conversant, in no acute distress Respiratory system: normal chest rise, clear, no audible wheezing Cardiovascular system: regular rhythm, s1-s2 Gastrointestinal system: hyperactive BS, generally tender Central nervous system: No seizures, no tremors Extremities: No cyanosis, no joint deformities Skin: No rashes, no pallor Psychiatry: Affect normal // no auditory hallucinations   Data Reviewed: I have personally reviewed following labs and imaging studies  CBC: Recent Labs  Lab 07/13/18 1810 07/14/18 0514 07/15/18 0507  07/16/18 0536 07/17/18 0510 07/18/18 0523  WBC 5.8 4.4 3.3* 5.5 4.5 4.4  NEUTROABS 4.3  --   --   --   --   --   HGB 9.7* 8.1* 7.9* 7.5* 7.5* 6.9*  HCT 28.4* 22.9* 22.4* 21.2* 22.0* 20.0*  MCV 104.0* 102.7* 101.8* 102.4* 105.3* 105.3*  PLT 67* 63* 55* 48* 53* 46*   Basic Metabolic Panel: Recent Labs  Lab 07/13/18 2005 07/14/18 0514 07/15/18 0507 07/16/18 0536 07/17/18 0510 07/18/18 0523  NA  --  141 139 143 144 137  K 2.7* 2.9* 3.4* 2.8* 2.9* 3.1*  CL  --  113* 110 113* 120* 115*  CO2  --  21* 19* 20* 18* 16*  GLUCOSE  --  117* 164* 144* 98 94  BUN  --  5* 6 8 6  <5*  CREATININE  --  0.90 0.75  0.69 0.84 0.76  CALCIUM  --  8.8* 8.3* 8.8* 9.0 8.5*  MG 1.5* 2.3  --   --  1.3* 1.8  PHOS 2.2*  --  2.8  --  2.7 1.8*   GFR: Estimated Creatinine Clearance: 71.2 mL/min (by C-G formula based on SCr of 0.76 mg/dL). Liver Function Tests: Recent Labs  Lab 07/13/18 1810 07/15/18 0507 07/17/18 0510 07/18/18 0523  AST 63* 52* 62* 43*  ALT 31 26 31 24   ALKPHOS 155* 125 106 89  BILITOT 4.7* 4.2* 4.2* 4.3*  PROT 8.8* 6.9 6.2* 6.2*  ALBUMIN 3.8 3.0* 2.9* 2.8*   Recent Labs  Lab 07/13/18 1810 07/15/18 0507  LIPASE 67* 94*   No results for input(s): AMMONIA in the last 168 hours. Coagulation Profile: No results for input(s): INR, PROTIME in the last 168 hours. Cardiac Enzymes: No results for input(s): CKTOTAL, CKMB, CKMBINDEX, TROPONINI in the last 168 hours. BNP (last 3 results) No results for input(s): PROBNP in the last 8760 hours. HbA1C: No results for input(s): HGBA1C in the last 72 hours. CBG: No results for input(s): GLUCAP in the last 168 hours. Lipid Profile: No results for input(s): CHOL, HDL, LDLCALC, TRIG, CHOLHDL, LDLDIRECT in the last 72 hours. Thyroid Function Tests: No results for input(s): TSH, T4TOTAL, FREET4, T3FREE, THYROIDAB in the last 72 hours. Anemia Panel: No results for input(s): VITAMINB12, FOLATE, FERRITIN, TIBC, IRON, RETICCTPCT in the last 72 hours. Sepsis Labs: No results for input(s): PROCALCITON, LATICACIDVEN in the last 168 hours.  Recent Results (from the past 240 hour(s))  Gastrointestinal Panel by PCR , Stool     Status: None   Collection Time: 07/14/18 11:02 AM  Result Value Ref Range Status   Campylobacter species NOT DETECTED NOT DETECTED Final   Plesimonas shigelloides NOT DETECTED NOT DETECTED Final   Salmonella species NOT DETECTED NOT DETECTED Final   Yersinia enterocolitica NOT DETECTED NOT DETECTED Final   Vibrio species NOT DETECTED NOT DETECTED Final   Vibrio cholerae NOT DETECTED NOT DETECTED Final   Enteroaggregative E  coli (EAEC) NOT DETECTED NOT DETECTED Final   Enteropathogenic E coli (EPEC) NOT DETECTED NOT DETECTED Final   Enterotoxigenic E coli (ETEC) NOT DETECTED NOT DETECTED Final   Shiga like toxin producing E coli (STEC) NOT DETECTED NOT DETECTED Final   Shigella/Enteroinvasive E coli (EIEC) NOT DETECTED NOT DETECTED Final   Cryptosporidium NOT DETECTED NOT DETECTED Final   Cyclospora cayetanensis NOT DETECTED NOT DETECTED Final   Entamoeba histolytica NOT DETECTED NOT DETECTED Final   Giardia lamblia NOT DETECTED NOT DETECTED Final   Adenovirus F40/41 NOT DETECTED NOT DETECTED  Final   Astrovirus NOT DETECTED NOT DETECTED Final   Norovirus GI/GII NOT DETECTED NOT DETECTED Final   Rotavirus A NOT DETECTED NOT DETECTED Final   Sapovirus (I, II, IV, and V) NOT DETECTED NOT DETECTED Final    Comment: Performed at [facility], [address] [place], [place] [address]  C difficile quick scan w PCR reflex     Status: Abnormal   Collection Time: 07/16/18 11:15 AM  Result Value Ref Range Status   C Diff antigen POSITIVE (A) NEGATIVE Final   C Diff toxin NEGATIVE NEGATIVE Final   C Diff interpretation Results are indeterminate. See PCR results.  Final    Comment: Performed at [facility], [place] [address], [place], [place] [address]  C. Diff by PCR, Reflexed     Status: None   Collection Time: 07/16/18 11:15 AM  Result Value Ref Range Status   Toxigenic C. Difficile by PCR NEGATIVE NEGATIVE Final    Comment: Patient is colonized with non toxigenic C. difficile. May not need treatment unless significant symptoms are present. Performed at [facility], [place] [address] [place], [place] [address]      Radiology Studies: No results found.  Scheduled Meds: . sodium chloride   Intravenous Once  . cholestyramine  4 g Oral Q12H  . lipase/protease/amylase  36,000 Units Oral TID AC  . nicotine  14 mg Transdermal Daily  . pregabalin  50 mg Oral TID  . sodium chloride  flush  3 mL Intravenous Q12H  . sucralfate  1 g Oral TID WC & HS  . vancomycin  125 mg Oral QID   Followed by  . [START ON 07/31/2018] vancomycin  125 mg Oral BID   Followed by  . [START ON 08/08/2018] vancomycin  125 mg Oral Daily   Followed by  . [START ON 08/15/2018] vancomycin  125 mg Oral QODAY   Followed by  . [START ON 09/12/2018] vancomycin  125 mg Oral Q3 days   Continuous Infusions: . sodium chloride 1,000 mL (07/17/18 0454)  . sodium chloride Stopped (07/17/18 1000)     LOS: 4 days   Marylu Lund, MD [facility] Pager On Amion  If 7PM-7AM, please contact night-coverage 07/18/2018, 4:16 PM

## 2018-07-18 NOTE — Progress Notes (Signed)
Dropped 1st syringe w/ 1 mg Dilaudid on the floor. Had to remove second one from Pyxis.

## 2018-07-18 NOTE — Progress Notes (Signed)
[facility] Subjective: Jamie Allen is a 1 year in white female with history of alcoholic cirrhosis and previous history of C. Difficile colitis hypothyroidism pancytopenia anxiety disorder and chronic abdominal pain presented to the emergency room again with nausea, abdominal pain and diarrhea. Her C. difficile antigen has been positive on 3 occasions in the C. Difficile toxin assays were -3 times as well her C. Difficile PCR is negative as per my discussion with Dr. Karolee Ohs yesterday in several locations plans are to start on vancomycin and try Lomotil with cholestyramine which seems to have helped a little. She continues to complain of ongoing abdominal pain but denies having any nausea vomiting at this time. She seems much more comfortable to and has had 8 small volume bowel movements today as opposed to 20 BMs yesterday. A CT scan done on him 07/15/2018 revealed wall thickening of the right and transverse colon consistent with infectious versus inflammatory colitis. The pancreas appear appeared unremarkable with no evidence of inflammatory changes she had moderate splenomegaly. She's had a cholecystectomy in the past.  Objective: Vital signs in last 24 hours: Temp:  [97.9 F (36.6 C)-98.7 F (37.1 C)] 98.7 F (37.1 C) (09/29 0447) Pulse Rate:  [77-91] 82 (09/29 0447) Resp:  [18-20] 18 (09/29 0447) BP: (117-130)/(52-74) 121/62 (09/29 0447) SpO2:  [99 %-100 %] 100 % (09/29 0447) Last BM Date: 07/17/18  Intake/Output from previous day: 09/28 0701 - 09/29 0700 In: 5571 [P.O.:340; I.V.:5131; IV Piggyback:100] Out: -  Intake/Output this shift: Total I/O In: 120 [P.O.:120] Out: -   General appearance: alert, cooperative, appears older than stated age, fatigued, mild distress and pale Resp: clear to auscultation bilaterally Cardio: regular rate and rhythm, S1, S2 normal, no murmur, click, rub or gallop GI: soft, non-tender; bowel sounds normal; no masses,  no  organomegaly Extremities: extremities normal, atraumatic, no cyanosis or edema  Lab Results: Recent Labs    07/16/18 0536 07/17/18 0510 07/18/18 0523  WBC 5.5 4.5 4.4  HGB 7.5* 7.5* 6.9*  HCT 21.2* 22.0* 20.0*  PLT 48* 53* 46*   BMET Recent Labs    07/16/18 0536 07/17/18 0510 07/18/18 0523  NA 143 144 137  K 2.8* 2.9* 3.1*  CL 113* 120* 115*  CO2 20* 18* 16*  GLUCOSE 144* 98 94  BUN 8 6 <5*  CREATININE 0.69 0.84 0.76  CALCIUM 8.8* 9.0 8.5*   LFT Recent Labs    07/18/18 0523  PROT 6.2*  ALBUMIN 2.8*  AST 43*  ALT 24  ALKPHOS 89  BILITOT 4.3*   C-Diff Recent Labs    07/16/18 1115  CDIFFTOX NEGATIVE   Recent Labs    07/16/18 1115  CDIFFPCR NEGATIVE   Studies/Results: No results found.  Medications: I have reviewed the patient's current medications.  Assessment/Plan: 1) Abdominal pain with diarrhea and enteritis noted on the CT scan well continue present care with vancomycin cholestyramine and Lomotil to see how she responds over the next 24 hours. There is no evidence of recurrent C. Difficile infections this time 2) Alcoholic cirrhosis with splenomegaly.  3)  GERD. 4) Pancytopenia. 5) Hypokalemia/hypomagnesemia.  6) Prolonged QT interval.  7) Renal insufficiency.  LOS: 4 days   Jamie Allen 07/18/2018, 10:59 AM

## 2018-07-19 ENCOUNTER — Encounter (HOSPITAL_COMMUNITY): Payer: Self-pay

## 2018-07-19 LAB — COMPREHENSIVE METABOLIC PANEL
ALT: 27 U/L (ref 0–44)
AST: 39 U/L (ref 15–41)
Albumin: 3.2 g/dL — ABNORMAL LOW (ref 3.5–5.0)
Alkaline Phosphatase: 101 U/L (ref 38–126)
Anion gap: 8 (ref 5–15)
CHLORIDE: 117 mmol/L — AB (ref 98–111)
CO2: 15 mmol/L — ABNORMAL LOW (ref 22–32)
Calcium: 8.8 mg/dL — ABNORMAL LOW (ref 8.9–10.3)
Creatinine, Ser: 0.74 mg/dL (ref 0.44–1.00)
GFR calc Af Amer: >60 mL/min (ref >60)
GFR calc non Af Amer: >60 mL/min (ref >60)
GLUCOSE: 105 mg/dL — AB (ref 70–99)
POTASSIUM: 2.9 mmol/L — AB (ref 3.5–5.1)
SODIUM: 140 mmol/L (ref 135–145)
Total Bilirubin: 4.7 mg/dL — ABNORMAL HIGH (ref 0.3–1.2)
Total Protein: 7.1 g/dL (ref 6.5–8.1)

## 2018-07-19 LAB — TYPE AND SCREEN
ABO/RH(D): O NEG
Antibody Screen: POSITIVE
Donor AG Type: NEGATIVE
Unit division: 0

## 2018-07-19 LAB — CBC
HCT: 25.4 % — ABNORMAL LOW (ref 36.0–46.0)
HEMOGLOBIN: 8.9 g/dL — AB (ref 12.0–15.0)
MCH: 35 pg — AB (ref 26.0–34.0)
MCHC: 35 g/dL (ref 30.0–36.0)
MCV: 100 fL (ref 78.0–100.0)
Platelets: 50 10*3/uL — ABNORMAL LOW (ref 150–400)
RBC: 2.54 MIL/uL — ABNORMAL LOW (ref 3.87–5.11)
RDW: 20.5 % — ABNORMAL HIGH (ref 11.5–15.5)
WBC: 5.2 10*3/uL (ref 4.0–10.5)

## 2018-07-19 LAB — BPAM RBC
Blood Product Expiration Date: 201910232359
ISSUE DATE / TIME: 201909291319
Unit Type and Rh: 9500

## 2018-07-19 LAB — MAGNESIUM: Magnesium: 1.9 mg/dL (ref 1.7–2.4)

## 2018-07-19 MED ORDER — K PHOS MONO-SOD PHOS DI & MONO 155-852-130 MG PO TABS
500.0000 mg | ORAL_TABLET | Freq: Two times a day (BID) | ORAL | Status: DC
Start: 2018-07-19 — End: 2018-07-20
  Administered 2018-07-19 (×2): 500 mg via ORAL
  Filled 2018-07-19 (×3): qty 2

## 2018-07-19 MED ORDER — POTASSIUM CHLORIDE 10 MEQ/100ML IV SOLN
10.0000 meq | INTRAVENOUS | Status: AC
  Administered 2018-07-19 (×6): 10 meq via INTRAVENOUS
  Filled 2018-07-19 (×6): qty 100

## 2018-07-19 MED ORDER — DIPHENOXYLATE-ATROPINE 2.5-0.025 MG PO TABS
1.0000 | ORAL_TABLET | Freq: Four times a day (QID) | ORAL | Status: DC
  Administered 2018-07-19 – 2018-07-22 (×12): 1 via ORAL
  Filled 2018-07-19 (×11): qty 1

## 2018-07-19 NOTE — Progress Notes (Addendum)
[facility] Progress Note   Chief Complaint:   diarrhea  SUBJECTIVE:    up numerous times during the night with cramps and watery diarrhea. Just up from toilet where there is watery (nearly clear) stool without significant odor   ASSESSMENT AND PLAN:   Severe diarrhea, hx of recurrent C-diff infection GI pathogen panel negative.   -She has a positive C-diff Ag but toxin negative so suspect colonization but will continue oral Vanco, would treat for 10 days.  -obtain GI pathogen panel -continue BID Questran -check fecal elastase.  -Continue Lomotil but will change to scheduled dosing as she isn't getting it very often. Still having watery diarrhea ( I saw in toilet) -continue Bentyl Q 6 hours as needed.   Hypokalemia. Repletion in progress. Mg+ 1.9  Cirrhosis / ?NASH / ETOH related and complicated by portal HTN.  She had an elevated IgG and ASMA several year ago.  She is well known to Dr. Havery Moros.  No evidence for decompensation  Chronic macrocytic anemia, probably multifactorial. Hgb at baseline 8-10, down to 6.9 yesterday. Received a unit of blood, hgb 8.9. Etiology for decline in Hgb unclear. 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 Portal hypertensive gastropathy on last EGD April 2018.    OBJECTIVE:     Vital signs in last 24 hours: Temp:  [97.7 F (36.5 C)-99.7 F (37.6 C)] 97.9 F (36.6 C) (09/30 0512) Pulse Rate:  [68-73] 68 (09/30 0512) Resp:  [12-18] 16 (09/30 0512) BP: (120-140)/(56-99) 137/59 (09/30 0512) SpO2:  [99 %-100 %] 99 % (09/30 0512) Last BM Date: 07/17/18 General:   Alert, well-developed, female in NAD EENT:  Normal hearing, non icteric sclera, conjunctive pink.  Heart:  Regular rate and rhythm; no murmurs.No lower extremity edema Pulm: Normal respiratory effort, lungs CTA bilaterally without wheezes or crackles. Abdomen:  Soft, nondistended, diffusely tendern.  Normal bowel sounds,  no masses felt.     Neurologic:  Alert and  oriented x4;  grossly normal neurologically. Psych:  Pleasant, cooperative.  Normal mood and affect.   Intake/Output from previous day: 09/29 0701 - 09/30 0700 In: 3004.7 [P.O.:360; I.V.:2174.8; Blood:469.8] Out: -  Intake/Output this shift: No intake/output data recorded.  Lab Results: Recent Labs    07/17/18 0510 07/18/18 0523 07/19/18 0526  WBC 4.5 4.4 5.2  HGB 7.5* 6.9* 8.9*  HCT 22.0* 20.0* 25.4*  PLT 53* 46* 50*   BMET Recent Labs    07/17/18 0510 07/18/18 0523 07/19/18 0526  NA 144 137 140  K 2.9* 3.1* 2.9*  CL 120* 115* 117*  CO2 18* 16* 15*  GLUCOSE 98 94 105*  BUN 6 <5* <5*  CREATININE 0.84 0.76 0.74  CALCIUM 9.0 8.5* 8.8*   LFT Recent Labs    07/19/18 0526  PROT 7.1  ALBUMIN 3.2*  AST 39  ALT 27  ALKPHOS 101  BILITOT 4.7*     Principal Problem:   Abdominal pain Active Problems:   Anxiety and depression   Hypomagnesemia   Pancytopenia (HCC)   Non-intractable vomiting with nausea   Hypokalemia   Prolonged Q-T interval on ECG   Chronic pancreatitis ([place])   Renal insufficiency   Pancreatitis     LOS: 5 days   Tye Savoy ,NP 07/19/2018, 9:31 AM    Attending physician's note   I have taken an interval history, reviewed the chart and examined the patient. I agree with the Advanced Practitioner's note, impression  and recommendations. Persistent diarrhea.  C. difficile antigen positive but negative for toxin and PCR No improvement with oral vancomycin, Lomotil or Questran Please record total stool output in 24-hour. We will check fecal elastase and fecal electrolytes Will consider stopping cholestyramine if no improvement within the next day We will plan for flexible sigmoidoscopy with biopsies if continues to have persistent diarrhea to exclude microscopic colitis or CMV colitis Continue supportive care Advance diet as tolerated Avoid soda or drinks with simple sugars Trial of  pancreatic lipase  Raliegh Ip Denzil Magnuson , MD [phone]

## 2018-07-19 NOTE — Progress Notes (Signed)
PROGRESS NOTE    Jamie Allen  JKK:[id number] DOB: Dec 15, 1970 DOA: 07/13/2018 PCP: Ladell Pier, MD    Brief Narrative:  47 y.o. female with medical history significant of significant ofliver cirrhosis, alcoholism, previous C. difficile colitis, hypothyroidism, pancytopenia, anxiety, and chronic abdominal pain; who presents with complaints of abdominal pain with nausea, vomiting, and diarrhea over the last 4 days.  Pain is mostly epigastric in nature constant, but also notes pain in the lower quadrants that she relates to her spleen being enlarged.  She has had several episodes of emesis and diarrhea both were noted to be yellow in color.  She denies any use of alcohol.  Associated symptoms include generalized malaise, weakness, and palpitations.  Patient reports that she has not been taking potassium pills due to unclear instructions and the fact that they make her nauseous and vomit.  She has a history of hypothyroidism, but reports that she has not been on the medication for she has a follow-up back up with her primary care provider.  Patient had just been hospitalized from 9/14-9/15 with similar symptoms thought to be related to pancreatitis.  However, patient left against Medical advice after she started feeling better after initial therapies.  C. difficile studies at that time were noted to be negative.  Upon admission into the emergency department patient was seen to be afebrile, pulse 72-1 06, respirations 16-22, blood pressure 134/92-140 5/72, and O2 saturations 100%. Labs revealed hemoglobin 9.7, platelets 67, potassium 2.2, BUN 7, creatinine 1.19, lipase 67, and AST 63. Patient was given 20 mEq of potassium chloride IV, 40 mEq p.o. of potassium chloride,Zofran,Phenergan, and IV fluids. TRH called to admit  Assessment & Plan:   Principal Problem:   Abdominal pain Active Problems:   Anxiety and depression   Hypomagnesemia   Pancytopenia (HCC)   Non-intractable  vomiting with nausea   Hypokalemia   Prolonged Q-T interval on ECG   Chronic pancreatitis (HCC)   Renal insufficiency   Pancreatitis  Abdominal pain, secondary to enteritis vs recurrent Cdiff colitis - Patient presents with complaints of abdominal pain lipase elevated at 67. Recent CT findings suggestive of pancreatitis vs enteritis - Continue Creon as tolerated -Will need continued IVF hydration per below -GI following. -Pt is continued on enteric precautions - Initial concerns of ?suicidal ideations. On further questioning, patient reports abd pain is to the point of "making me want to jump in front of a car." Patient used this statement in reference only to the degree of pain, NOT of suicidal ideation or intent. Patient has bluntly denied suicidal or homicidal ideations to me. -Cdiff studies neg -GI recommendation today for measuring stool output, fecal elastase, treating PO vanc x 10 days, continuing lomotil  Nausea, vomiting, diarrhea:  -Likely related to above enteritis -Recent Cdiff study neg. -Have ordered stool study, neg -Repeat Cdiff neg -PO vanc continued by GI per above -still with multiple bouts of diarrhea noted -Continue IVF hydration as pt continues to have diarrhea, risk for severe dehydration  Prolonged QT interval:  - Acute. Initial QTc 516 on admission. - Correct electrolyte abnormality - Hold QT prolonging medications - QTc improve on follow up EKG, reviewed - Will continue on tele in setting of profound electrolyte imbalances  Hypokalemia:  -Presenting K of 2.2 noted -Likely secondary to nausea/vomiting and diarrhea prior to admit -Replacement given. -Still low. Will replace  Hypomagnesia:  - Initial magnesium level noted to be 1.5 on admission. - still low today -Give additional Mg replacement  Liver cirrhosis with splenomegaly:  -Patient with history of liver disease related with history of alcoholism. -Recommend continued ETOH cessation    -Ultrasound from 8/28 revealed notes that splenic volume was increased from 1325cm to 1447 cm.   -Patient originally planned to follow-up with surgery to have a splenectomy.   - Continue outpatient follow-up with general surgery for need of splenectomy -stable at present  Pancytopenia:  -Presenting hemoglobin 9.7 and platelet count 67.   -Suspecting cirrhosis as possible contributing factor in thrombocytopenia -Patient had been followed by Dr. Marin Olp and receives intermittent blood transfusions. - hgb down to 6.9 today. Given one unit PRBC. Stable at present -Will recheck cbc in AM  Hypothyroidism:  -Patient reports previously being on levothyroxine, but has not been on this medication due to needing to follow-up with her primary care provider. - TSH reviewed, within normal limits -Currently stable  Renal insufficiency:  -Patient presents with creatinine 1.9 on admission.  Baseline creatinine previously noted to be her within normal limits in August. - Likely secondary to dehydration related to vomiting and diarrhea -resolved with IVF hydration -Will recheck bmet in AM  GERD - Held PPIs due to prolonged QT interval also hx of cdiff colitis - Continue Carafate - Presently stable  DVT prophylaxis: SCD's Code Status: Full Family Communication: Pt in room, family at bedside Disposition Plan: Uncertain at this time  Consultants:   [facility]  Procedures:     Antimicrobials: Anti-infectives (From admission, onward)   Start     Dose/Rate Route Frequency Ordered Stop   09/12/18 1000  vancomycin (VANCOCIN) 50 mg/mL oral solution 125 mg     125 mg Oral Every 3 DAYS 07/17/18 1207 10/12/18 0959   08/15/18 1000  vancomycin (VANCOCIN) 50 mg/mL oral solution 125 mg     125 mg Oral Every other day 07/17/18 1207 09/12/18 0959   08/08/18 1000  vancomycin (VANCOCIN) 50 mg/mL oral solution 125 mg     125 mg Oral Daily 07/17/18 1207 08/15/18 0959   07/31/18 2200  vancomycin  (VANCOCIN) 50 mg/mL oral solution 125 mg     125 mg Oral 2 times daily 07/17/18 1207 08/07/18 2159   07/17/18 1400  vancomycin (VANCOCIN) 50 mg/mL oral solution 125 mg     125 mg Oral 4 times daily 07/17/18 1207 07/31/18 1359      Subjective: Still with multiple bouts of watery diarrhea overnight  Objective: Vitals:   07/18/18 1655 07/18/18 2121 07/19/18 0512 07/19/18 1421  BP: 136/73 140/74 (!) 137/59 (!) 146/59  Pulse: 70 73 68 79  Resp: 18 12 16 16   Temp: 97.7 F (36.5 C) 98.2 F (36.8 C) 97.9 F (36.6 C) 98.3 F (36.8 C)  TempSrc: Oral Oral Oral Oral  SpO2: 100% 100% 99% 100%  Weight:      Height:        Intake/Output Summary (Last 24 hours) at 07/19/2018 1801 Last data filed at 07/19/2018 1422 Gross per 24 hour  Intake 2773.28 ml  Output 800 ml  Net 1973.28 ml   Filed Weights   07/13/18 1645  Weight: 58.1 kg    Examination: General exam: Awake, laying in bed, in nad Respiratory system: Normal respiratory effort, no wheezing Cardiovascular system: regular rate, s1, s2 Gastrointestinal system: mildly distended, pos BS, generally tender Central nervous system: CN2-12 grossly intact, strength intact Extremities: Perfused, no clubbing Skin: Normal skin turgor, no notable skin lesions seen Psychiatry: Mood normal // no visual hallucinations   Data Reviewed: I have  personally reviewed following labs and imaging studies  CBC: Recent Labs  Lab 07/13/18 1810  07/15/18 0507 07/16/18 0536 07/17/18 0510 07/18/18 0523 07/19/18 0526  WBC 5.8   < > 3.3* 5.5 4.5 4.4 5.2  NEUTROABS 4.3  --   --   --   --   --   --   HGB 9.7*   < > 7.9* 7.5* 7.5* 6.9* 8.9*  HCT 28.4*   < > 22.4* 21.2* 22.0* 20.0* 25.4*  MCV 104.0*   < > 101.8* 102.4* 105.3* 105.3* 100.0  PLT 67*   < > 55* 48* 53* 46* 50*   < > = values in this interval not displayed.   Basic Metabolic Panel: Recent Labs  Lab 07/13/18 2005 07/14/18 7096 07/15/18 0507 07/16/18 0536 07/17/18 0510 07/18/18 0523  07/19/18 0526  NA  --  141 139 143 144 137 140  K 2.7* 2.9* 3.4* 2.8* 2.9* 3.1* 2.9*  CL  --  113* 110 113* 120* 115* 117*  CO2  --  21* 19* 20* 18* 16* 15*  GLUCOSE  --  117* 164* 144* 98 94 105*  BUN  --  5* 6 8 6  <5* <5*  CREATININE  --  0.90 0.75 0.69 0.84 0.76 0.74  CALCIUM  --  8.8* 8.3* 8.8* 9.0 8.5* 8.8*  MG 1.5* 2.3  --   --  1.3* 1.8 1.9  PHOS 2.2*  --  2.8  --  2.7 1.8*  --    GFR: Estimated Creatinine Clearance: 71.2 mL/min (by C-G formula based on SCr of 0.74 mg/dL). Liver Function Tests: Recent Labs  Lab 07/13/18 1810 07/15/18 0507 07/17/18 0510 07/18/18 0523 07/19/18 0526  AST 63* 52* 62* 43* 39  ALT 31 26 31 24 27   ALKPHOS 155* 125 106 89 101  BILITOT 4.7* 4.2* 4.2* 4.3* 4.7*  PROT 8.8* 6.9 6.2* 6.2* 7.1  ALBUMIN 3.8 3.0* 2.9* 2.8* 3.2*   Recent Labs  Lab 07/13/18 1810 07/15/18 0507  LIPASE 67* 94*   No results for input(s): AMMONIA in the last 168 hours. Coagulation Profile: No results for input(s): INR, PROTIME in the last 168 hours. Cardiac Enzymes: No results for input(s): CKTOTAL, CKMB, CKMBINDEX, TROPONINI in the last 168 hours. BNP (last 3 results) No results for input(s): PROBNP in the last 8760 hours. HbA1C: No results for input(s): HGBA1C in the last 72 hours. CBG: No results for input(s): GLUCAP in the last 168 hours. Lipid Profile: No results for input(s): CHOL, HDL, LDLCALC, TRIG, CHOLHDL, LDLDIRECT in the last 72 hours. Thyroid Function Tests: No results for input(s): TSH, T4TOTAL, FREET4, T3FREE, THYROIDAB in the last 72 hours. Anemia Panel: No results for input(s): VITAMINB12, FOLATE, FERRITIN, TIBC, IRON, RETICCTPCT in the last 72 hours. Sepsis Labs: No results for input(s): PROCALCITON, LATICACIDVEN in the last 168 hours.  Recent Results (from the past 240 hour(s))  Gastrointestinal Panel by PCR , Stool     Status: None   Collection Time: 07/14/18 11:02 AM  Result Value Ref Range Status   Campylobacter species NOT DETECTED  NOT DETECTED Final   Plesimonas shigelloides NOT DETECTED NOT DETECTED Final   Salmonella species NOT DETECTED NOT DETECTED Final   Yersinia enterocolitica NOT DETECTED NOT DETECTED Final   Vibrio species NOT DETECTED NOT DETECTED Final   Vibrio cholerae NOT DETECTED NOT DETECTED Final   Enteroaggregative E coli (EAEC) NOT DETECTED NOT DETECTED Final   Enteropathogenic E coli (EPEC) NOT DETECTED NOT DETECTED Final   Enterotoxigenic  E coli (ETEC) NOT DETECTED NOT DETECTED Final   Shiga like toxin producing E coli (STEC) NOT DETECTED NOT DETECTED Final   Shigella/Enteroinvasive E coli (EIEC) NOT DETECTED NOT DETECTED Final   Cryptosporidium NOT DETECTED NOT DETECTED Final   Cyclospora cayetanensis NOT DETECTED NOT DETECTED Final   Entamoeba histolytica NOT DETECTED NOT DETECTED Final   Giardia lamblia NOT DETECTED NOT DETECTED Final   Adenovirus F40/41 NOT DETECTED NOT DETECTED Final   Astrovirus NOT DETECTED NOT DETECTED Final   Norovirus GI/GII NOT DETECTED NOT DETECTED Final   Rotavirus A NOT DETECTED NOT DETECTED Final   Sapovirus (I, II, IV, and V) NOT DETECTED NOT DETECTED Final    Comment: Performed at [facility], [address] [place], [place] [address]  C difficile quick scan w PCR reflex     Status: Abnormal   Collection Time: 07/16/18 11:15 AM  Result Value Ref Range Status   C Diff antigen POSITIVE (A) NEGATIVE Final   C Diff toxin NEGATIVE NEGATIVE Final   C Diff interpretation Results are indeterminate. See PCR results.  Final    Comment: Performed at [facility], [place] [address] [place], [place] [address]  C. Diff by PCR, Reflexed     Status: None   Collection Time: 07/16/18 11:15 AM  Result Value Ref Range Status   Toxigenic C. Difficile by PCR NEGATIVE NEGATIVE Final    Comment: Patient is colonized with non toxigenic C. difficile. May not need treatment unless significant symptoms are present. Performed at [facility],  [place] [address], [place], [place] [address]      Radiology Studies: No results found.  Scheduled Meds: . sodium chloride   Intravenous Once  . cholestyramine  4 g Oral Q12H  . diphenoxylate-atropine  1 tablet Oral QID  . lipase/protease/amylase  36,000 Units Oral TID AC  . nicotine  14 mg Transdermal Daily  . phosphorus  500 mg Oral BID  . pregabalin  50 mg Oral TID  . sodium chloride flush  3 mL Intravenous Q12H  . sucralfate  1 g Oral TID WC & HS  . vancomycin  125 mg Oral QID   Followed by  . [START ON 07/31/2018] vancomycin  125 mg Oral BID   Followed by  . [START ON 08/08/2018] vancomycin  125 mg Oral Daily   Followed by  . [START ON 08/15/2018] vancomycin  125 mg Oral QODAY   Followed by  . [START ON 09/12/2018] vancomycin  125 mg Oral Q3 days   Continuous Infusions: . sodium chloride 1,000 mL (07/19/18 1215)  . sodium chloride Stopped (07/17/18 1000)     LOS: 5 days   Marylu Lund, MD [facility] Pager On Amion  If 7PM-7AM, please contact night-coverage 07/19/2018, 6:01 PM

## 2018-07-19 NOTE — Plan of Care (Signed)
Pt still experiencing pain and discomfort and utilizing her PRN meds.

## 2018-07-20 ENCOUNTER — Ambulatory Visit: Payer: Self-pay | Admitting: Hematology & Oncology

## 2018-07-20 ENCOUNTER — Encounter (HOSPITAL_COMMUNITY): Payer: Self-pay

## 2018-07-20 ENCOUNTER — Other Ambulatory Visit: Payer: Self-pay

## 2018-07-20 LAB — COMPREHENSIVE METABOLIC PANEL
ALBUMIN: 3.4 g/dL — AB (ref 3.5–5.0)
ALT: 25 U/L (ref 0–44)
ANION GAP: 6 (ref 5–15)
AST: 38 U/L (ref 15–41)
Alkaline Phosphatase: 106 U/L (ref 38–126)
BUN: <5 mg/dL — ABNORMAL LOW (ref 6–20)
CO2: 16 mmol/L — AB (ref 22–32)
Calcium: 9.1 mg/dL (ref 8.9–10.3)
Chloride: 118 mmol/L — ABNORMAL HIGH (ref 98–111)
Creatinine, Ser: 0.72 mg/dL (ref 0.44–1.00)
GFR calc non Af Amer: >60 mL/min (ref >60)
GLUCOSE: 104 mg/dL — AB (ref 70–99)
POTASSIUM: 3.1 mmol/L — AB (ref 3.5–5.1)
Sodium: 140 mmol/L (ref 135–145)
TOTAL PROTEIN: 7 g/dL (ref 6.5–8.1)
Total Bilirubin: 5.3 mg/dL — ABNORMAL HIGH (ref 0.3–1.2)

## 2018-07-20 LAB — CBC
HEMATOCRIT: 25.4 % — AB (ref 36.0–46.0)
HEMOGLOBIN: 8.8 g/dL — AB (ref 12.0–15.0)
MCH: 34.8 pg — AB (ref 26.0–34.0)
MCHC: 34.6 g/dL (ref 30.0–36.0)
MCV: 100.4 fL — ABNORMAL HIGH (ref 78.0–100.0)
Platelets: 46 10*3/uL — ABNORMAL LOW (ref 150–400)
RBC: 2.53 MIL/uL — AB (ref 3.87–5.11)
RDW: 20.4 % — ABNORMAL HIGH (ref 11.5–15.5)
WBC: 3.4 10*3/uL — ABNORMAL LOW (ref 4.0–10.5)

## 2018-07-20 LAB — VITAMIN B12: Vitamin B-12: 1453 pg/mL — ABNORMAL HIGH (ref 180–914)

## 2018-07-20 LAB — PHOSPHORUS: PHOSPHORUS: 3.9 mg/dL (ref 2.5–4.6)

## 2018-07-20 LAB — FOLATE: Folate: 6 ng/mL (ref >5.9)

## 2018-07-20 LAB — PANCREATIC ELASTASE, FECAL

## 2018-07-20 LAB — MAGNESIUM: Magnesium: 1.7 mg/dL (ref 1.7–2.4)

## 2018-07-20 MED ORDER — FAMOTIDINE 20 MG PO TABS
20.0000 mg | ORAL_TABLET | Freq: Two times a day (BID) | ORAL | Status: DC
  Administered 2018-07-20 – 2018-07-23 (×7): 20 mg via ORAL
  Filled 2018-07-20 (×7): qty 1

## 2018-07-20 MED ORDER — MAGNESIUM SULFATE 4 GM/100ML IV SOLN
4.0000 g | Freq: Once | INTRAVENOUS | Status: AC
  Administered 2018-07-20: 4 g via INTRAVENOUS
  Filled 2018-07-20: qty 100

## 2018-07-20 MED ORDER — PANCRELIPASE (LIP-PROT-AMYL) 12000-38000 UNITS PO CPEP
72000.0000 [IU] | ORAL_CAPSULE | Freq: Three times a day (TID) | ORAL | Status: DC
  Administered 2018-07-20 – 2018-07-22 (×7): 72000 [IU] via ORAL
  Filled 2018-07-20 (×11): qty 6

## 2018-07-20 MED ORDER — POTASSIUM CHLORIDE 10 MEQ/100ML IV SOLN
10.0000 meq | INTRAVENOUS | Status: AC
  Administered 2018-07-20 (×6): 10 meq via INTRAVENOUS
  Filled 2018-07-20 (×6): qty 100

## 2018-07-20 NOTE — Plan of Care (Signed)
Pt states she is not eating very much. Pt asks for PRN pain meds as son as they are due.

## 2018-07-20 NOTE — Care Management Note (Signed)
Case Management Note  Patient Details  Name: Jamie Allen MRN: [id number] Date of Birth: Oct 05, 1971  Subjective/Objective: Admitted w/abd pain.Hx: etoh,cirrhosis, cdiff neg.Diarrhea,enteritis. Stool studies pend. GI following.                   Action/Plan:dc plan home.   Expected Discharge Date:  07/16/18               Expected Discharge Plan:  Home/Self Care  In-House Referral:     Discharge planning Services  CM Consult  Post Acute Care Choice:    Choice offered to:     DME Arranged:    DME Agency:     HH Arranged:    HH Agency:     Status of Service:  In process, will continue to follow  If discussed at Long Length of Stay Meetings, dates discussed:    Additional Comments:  Dessa Phi, RN 07/20/2018, 12:34 PM

## 2018-07-20 NOTE — Progress Notes (Addendum)
[facility] Progress Note   Chief Complaint:   diarrhea   SUBJECTIVE:    Stool still watery but less frequent. She reports ~ 8 BMs during the night. Saving all stool for measuring     ASSESSMENT AND PLAN:   Severe diarrhea, hx of recurrent C-diff but toxin negative this admission. GI  Path panel negative too. Stool output 1.7 liters yesterday. Had 8 watery BMs through the night which she says is an improvement -fecal elastase pending -yesterday we added pancreatic enzymes , changed lomotil to scheduled dosing.  -continuing Vanco for total of 10 days even though toxin negative.  -Questran was stopped (can make diarrhea worse in some cases)  Hypokalemia, repletion per primary team.   Cirrhosis / ?NASH / ETOH related and complicated by portal HTN.  No evidence for hepatic decompensation. Ongoing liver care with Dr. Havery Moros -She has an elevated IgG (non-specific).  -ASMA elevated several year ago but normal on recent repeat lab -Iron sat 95% (all Etoh related?). No ETOH in months.   -ferritin is markedly elevated at 2166.   Chronic macrocytic anemia, probably multifactorial. Hgb at baseline 8-10, down to 6.9 this admission.  Received a unit of blood, hgb stable at 8.9 since. Etiology for decline in Hgb unclear. She denies overt GI bleed.  -Last folate check was in March. Will update B12, folate labs    OBJECTIVE:     Vital signs in last 24 hours: Temp:  [97.8 F (36.6 C)-98.6 F (37 C)] 97.8 F (36.6 C) (10/01 0412) Pulse Rate:  [65-79] 70 (10/01 0412) Resp:  [12-16] 16 (10/01 0412) BP: (136-146)/(55-69) 139/69 (10/01 0412) SpO2:  [98 %-100 %] 98 % (10/01 0412) Last BM Date: 07/19/18 General:   Alert, well-developed, female in NAD EENT:  Normal hearing, non icteric sclera, conjunctive pink.  Heart:  Regular rate and rhythm; no murmurs. No lower extremity edema Pulm: Normal respiratory effort, lungs CTA bilaterally without wheezes or  crackles. Abdomen:  Soft, nondistended, nontender.  Normal bowel sounds, no masses felt.     Neurologic:  Alert and  oriented x4;  grossly normal neurologically. Psych:  Pleasant, cooperative.  Normal mood and affect.   Intake/Output from previous day: 09/30 0701 - 10/01 0700 In: 3464.2 [P.O.:240; I.V.:3224.2] Out: 1700 [Stool:1700] Intake/Output this shift: No intake/output data recorded.  Lab Results: Recent Labs    07/18/18 0523 07/19/18 0526 07/20/18 0549  WBC 4.4 5.2 3.4*  HGB 6.9* 8.9* 8.8*  HCT 20.0* 25.4* 25.4*  PLT 46* 50* 46*   BMET Recent Labs    07/18/18 0523 07/19/18 0526 07/20/18 0549  NA 137 140 140  K 3.1* 2.9* 3.1*  CL 115* 117* 118*  CO2 16* 15* 16*  GLUCOSE 94 105* 104*  BUN <5* <5* <5*  CREATININE 0.76 0.74 0.72  CALCIUM 8.5* 8.8* 9.1   LFT Recent Labs    07/20/18 0549  PROT 7.0  ALBUMIN 3.4*  AST 38  ALT 25  ALKPHOS 106  BILITOT 5.3*    Principal Problem:   Abdominal pain Active Problems:   Anxiety and depression   Hypomagnesemia   Pancytopenia (HCC)   Non-intractable vomiting with nausea   Hypokalemia   Prolonged Q-T interval on ECG   Chronic pancreatitis ([place])   Renal insufficiency   Pancreatitis     LOS: 6 days   Tye Savoy ,NP 07/20/2018, 9:33 AM    Attending physician's note   I have taken an interval history, reviewed the chart and  examined the patient. I agree with the Advanced Practitioner's note, impression and recommendations.  Diarrhea is slightly better with Lomotil.  Added Creon today. We will continue to evaluate, if no improvement will consider flexible sigmoidoscopy with biopsies Advance diet as tolerated  Raliegh Ip Denzil Magnuson , MD [phone]

## 2018-07-20 NOTE — Plan of Care (Signed)
Pt required only pain meds this shift, no other PRN's.

## 2018-07-20 NOTE — Progress Notes (Signed)
PROGRESS NOTE    Jamie Allen  FIE:[id number] DOB: 1970-12-18 DOA: 07/13/2018 PCP: Ladell Pier, MD    Brief Narrative:  47 y.o. female with medical history significant of significant ofliver cirrhosis, alcoholism, previous C. difficile colitis, hypothyroidism, pancytopenia, anxiety, and chronic abdominal pain; who presents with complaints of abdominal pain with nausea, vomiting, and diarrhea over the last 4 days.  Pain is mostly epigastric in nature constant, but also notes pain in the lower quadrants that she relates to her spleen being enlarged.  She has had several episodes of emesis and diarrhea both were noted to be yellow in color.  She denies any use of alcohol.  Associated symptoms include generalized malaise, weakness, and palpitations.  Patient reports that she has not been taking potassium pills due to unclear instructions and the fact that they make her nauseous and vomit.  She has a history of hypothyroidism, but reports that she has not been on the medication for she has a follow-up back up with her primary care provider.  Patient had just been hospitalized from 9/14-9/15 with similar symptoms thought to be related to pancreatitis.  However, patient left against Medical advice after she started feeling better after initial therapies.  C. difficile studies at that time were noted to be negative.  Upon admission into the emergency department patient was seen to be afebrile, pulse 72-1 06, respirations 16-22, blood pressure 134/92-140 5/72, and O2 saturations 100%. Labs revealed hemoglobin 9.7, platelets 67, potassium 2.2, BUN 7, creatinine 1.19, lipase 67, and AST 63. Patient was given 20 mEq of potassium chloride IV, 40 mEq p.o. of potassium chloride,Zofran,Phenergan, and IV fluids. TRH called to admit  Assessment & Plan:   Principal Problem:   Abdominal pain Active Problems:   Anxiety and depression   Hypomagnesemia   Pancytopenia (HCC)   Non-intractable  vomiting with nausea   Hypokalemia   Prolonged Q-T interval on ECG   Chronic pancreatitis (HCC)   Renal insufficiency   Pancreatitis  Abdominal pain, secondary to enteritis vs recurrent Cdiff colitis - Recent CT findings suggestive of pancreatitis vs enteritis - Continued on Creon as tolerated -Continue on IVF hydration as tolerated. Will need fluids given continued diarrhea -GI following. - Initial concerns of ?suicidal ideations. On further questioning, patient reports abd pain is to the point of "making me want to jump in front of a car." Patient used this statement in reference only to the degree of pain, NOT of suicidal ideation or intent. Patient has bluntly denied suicidal or homicidal ideations to me. -Cdiff studies neg -GI recommendation today for measuring stool output, fecal elastase pending, treating PO vanc x 10 days, continuing lomotil -questran stopped by GI  Nausea, vomiting, diarrhea:  -Likely related to above enteritis -Recent Cdiff study neg. -Have ordered stool study, neg -Repeat Cdiff neg -PO vanc continued by GI per above -still with multiple bouts of diarrhea noted -Continue IVF hydration as pt continues to have diarrhea, risk for severe dehydration -Stool volume seems to be decreasing overnight  Prolonged QT interval:  - Acute. Initial QTc 516 on admission. - Correct electrolyte abnormality - Hold QT prolonging medications - QTc improve on follow up EKG, reviewed - Given continued electrolyte deficiencies related to diarrhea, will continue on tele  Hypokalemia:  -Presenting K of 2.2 noted -Likely secondary to nausea/vomiting and diarrhea prior to admit -Replacement given. -remains low, replace today -Repeat bmet in AM  Hypomagnesia:  - Initial magnesium level noted to be 1.5 on admission. - Mg  of 1.7. Goal of 2 - Will replace Mg   Liver cirrhosis with splenomegaly:  -Patient with history of liver disease related with history of  alcoholism. -Recommend continued ETOH cessation  -Ultrasound from 8/28 revealed notes that splenic volume was increased from 1325cm to 1447 cm.   -Patient originally planned to follow-up with surgery to have a splenectomy.   - Continue outpatient follow-up with general surgery for need of splenectomy -Remains stable at present  Pancytopenia:  -Presenting hemoglobin 9.7 and platelet count 67.   -Suspecting cirrhosis as possible contributing factor in thrombocytopenia -Patient had been followed by Dr. Marin Olp and receives intermittent blood transfusions. - hgb down to 6.9 recently, improved after one unit PRBC. -Remains hemodynamically stable -Repeat cbc in AM  Hypothyroidism:  -Patient reports previously being on levothyroxine, but has not been on this medication due to needing to follow-up with her primary care provider. - TSH reviewed, within normal limits -Presently stable  Renal insufficiency:  -Patient presents with creatinine 1.9 on admission.  Baseline creatinine previously noted to be her within normal limits in August. - Likely secondary to dehydration related to vomiting and diarrhea -resolved with IVF hydration -Repeat bmet in AM  GERD - Held PPIs due to prolonged QT interval also hx of cdiff colitis - Continue Carafate - Will add H2 blocker.  DVT prophylaxis: SCD's Code Status: Full Family Communication: Pt in room, family at bedside Disposition Plan: Uncertain at this time  Consultants:   [facility]  Procedures:     Antimicrobials: Anti-infectives (From admission, onward)   Start     Dose/Rate Route Frequency Ordered Stop   09/12/18 1000  vancomycin (VANCOCIN) 50 mg/mL oral solution 125 mg     125 mg Oral Every 3 DAYS 07/17/18 1207 10/12/18 0959   08/15/18 1000  vancomycin (VANCOCIN) 50 mg/mL oral solution 125 mg     125 mg Oral Every other day 07/17/18 1207 09/12/18 0959   08/08/18 1000  vancomycin (VANCOCIN) 50 mg/mL oral solution 125 mg     125  mg Oral Daily 07/17/18 1207 08/15/18 0959   07/31/18 2200  vancomycin (VANCOCIN) 50 mg/mL oral solution 125 mg     125 mg Oral 2 times daily 07/17/18 1207 08/07/18 2159   07/17/18 1400  vancomycin (VANCOCIN) 50 mg/mL oral solution 125 mg     125 mg Oral 4 times daily 07/17/18 1207 07/31/18 1359      Subjective: States diarrhea volume seems to improve overnight  Objective: Vitals:   07/19/18 1421 07/19/18 2013 07/20/18 0412 07/20/18 1308  BP: (!) 146/59 (!) 136/55 139/69 139/64  Pulse: 79 65 70 75  Resp: 16 12 16 16   Temp: 98.3 F (36.8 C) 98.6 F (37 C) 97.8 F (36.6 C) 98.1 F (36.7 C)  TempSrc: Oral Oral Oral Oral  SpO2: 100% 98% 98% 100%  Weight:      Height:        Intake/Output Summary (Last 24 hours) at 07/20/2018 1534 Last data filed at 07/20/2018 1400 Gross per 24 hour  Intake 4226.89 ml  Output 1100 ml  Net 3126.89 ml   Filed Weights   07/13/18 1645  Weight: 58.1 kg    Examination: General exam: Conversant, in no acute distress Respiratory system: normal chest rise, clear, no audible wheezing Cardiovascular system: regular rhythm, s1-s2 Gastrointestinal system: Mildly distended, pos BS Central nervous system: No seizures, no tremors Extremities: No cyanosis, no joint deformities Skin: No rashes, no pallor Psychiatry: Affect normal // no auditory hallucinations  Data Reviewed: I have personally reviewed following labs and imaging studies  CBC: Recent Labs  Lab 07/13/18 1810  07/16/18 0536 07/17/18 0510 07/18/18 0523 07/19/18 0526 07/20/18 0549  WBC 5.8   < > 5.5 4.5 4.4 5.2 3.4*  NEUTROABS 4.3  --   --   --   --   --   --   HGB 9.7*   < > 7.5* 7.5* 6.9* 8.9* 8.8*  HCT 28.4*   < > 21.2* 22.0* 20.0* 25.4* 25.4*  MCV 104.0*   < > 102.4* 105.3* 105.3* 100.0 100.4*  PLT 67*   < > 48* 53* 46* 50* 46*   < > = values in this interval not displayed.   Basic Metabolic Panel: Recent Labs  Lab 07/13/18 2005 07/14/18 4825 07/15/18 0507  07/16/18 0536 07/17/18 0510 07/18/18 0523 07/19/18 0526 07/20/18 0549  NA  --  141 139 143 144 137 140 140  K 2.7* 2.9* 3.4* 2.8* 2.9* 3.1* 2.9* 3.1*  CL  --  113* 110 113* 120* 115* 117* 118*  CO2  --  21* 19* 20* 18* 16* 15* 16*  GLUCOSE  --  117* 164* 144* 98 94 105* 104*  BUN  --  5* 6 8 6  <5* <5* <5*  CREATININE  --  0.90 0.75 0.69 0.84 0.76 0.74 0.72  CALCIUM  --  8.8* 8.3* 8.8* 9.0 8.5* 8.8* 9.1  MG 1.5* 2.3  --   --  1.3* 1.8 1.9 1.7  PHOS 2.2*  --  2.8  --  2.7 1.8*  --  3.9   GFR: Estimated Creatinine Clearance: 71.2 mL/min (by C-G formula based on SCr of 0.72 mg/dL). Liver Function Tests: Recent Labs  Lab 07/15/18 0507 07/17/18 0510 07/18/18 0523 07/19/18 0526 07/20/18 0549  AST 52* 62* 43* 39 38  ALT 26 31 24 27 25   ALKPHOS 125 106 89 101 106  BILITOT 4.2* 4.2* 4.3* 4.7* 5.3*  PROT 6.9 6.2* 6.2* 7.1 7.0  ALBUMIN 3.0* 2.9* 2.8* 3.2* 3.4*   Recent Labs  Lab 07/13/18 1810 07/15/18 0507  LIPASE 67* 94*   No results for input(s): AMMONIA in the last 168 hours. Coagulation Profile: No results for input(s): INR, PROTIME in the last 168 hours. Cardiac Enzymes: No results for input(s): CKTOTAL, CKMB, CKMBINDEX, TROPONINI in the last 168 hours. BNP (last 3 results) No results for input(s): PROBNP in the last 8760 hours. HbA1C: No results for input(s): HGBA1C in the last 72 hours. CBG: No results for input(s): GLUCAP in the last 168 hours. Lipid Profile: No results for input(s): CHOL, HDL, LDLCALC, TRIG, CHOLHDL, LDLDIRECT in the last 72 hours. Thyroid Function Tests: No results for input(s): TSH, T4TOTAL, FREET4, T3FREE, THYROIDAB in the last 72 hours. Anemia Panel: Recent Labs    07/20/18 1122  VITAMINB12 1,453*  FOLATE 6.0   Sepsis Labs: No results for input(s): PROCALCITON, LATICACIDVEN in the last 168 hours.  Recent Results (from the past 240 hour(s))  Gastrointestinal Panel by PCR , Stool     Status: None   Collection Time: 07/14/18 11:02 AM   Result Value Ref Range Status   Campylobacter species NOT DETECTED NOT DETECTED Final   Plesimonas shigelloides NOT DETECTED NOT DETECTED Final   Salmonella species NOT DETECTED NOT DETECTED Final   Yersinia enterocolitica NOT DETECTED NOT DETECTED Final   Vibrio species NOT DETECTED NOT DETECTED Final   Vibrio cholerae NOT DETECTED NOT DETECTED Final   Enteroaggregative E coli (EAEC) NOT DETECTED NOT  DETECTED Final   Enteropathogenic E coli (EPEC) NOT DETECTED NOT DETECTED Final   Enterotoxigenic E coli (ETEC) NOT DETECTED NOT DETECTED Final   Shiga like toxin producing E coli (STEC) NOT DETECTED NOT DETECTED Final   Shigella/Enteroinvasive E coli (EIEC) NOT DETECTED NOT DETECTED Final   Cryptosporidium NOT DETECTED NOT DETECTED Final   Cyclospora cayetanensis NOT DETECTED NOT DETECTED Final   Entamoeba histolytica NOT DETECTED NOT DETECTED Final   Giardia lamblia NOT DETECTED NOT DETECTED Final   Adenovirus F40/41 NOT DETECTED NOT DETECTED Final   Astrovirus NOT DETECTED NOT DETECTED Final   Norovirus GI/GII NOT DETECTED NOT DETECTED Final   Rotavirus A NOT DETECTED NOT DETECTED Final   Sapovirus (I, II, IV, and V) NOT DETECTED NOT DETECTED Final    Comment: Performed at [facility], [address] [place], [place] [address]  C difficile quick scan w PCR reflex     Status: Abnormal   Collection Time: 07/16/18 11:15 AM  Result Value Ref Range Status   C Diff antigen POSITIVE (A) NEGATIVE Final   C Diff toxin NEGATIVE NEGATIVE Final   C Diff interpretation Results are indeterminate. See PCR results.  Final    Comment: Performed at [facility], [place] [address], [place], [place] [address]  C. Diff by PCR, Reflexed     Status: None   Collection Time: 07/16/18 11:15 AM  Result Value Ref Range Status   Toxigenic C. Difficile by PCR NEGATIVE NEGATIVE Final    Comment: Patient is colonized with non toxigenic C. difficile. May not need treatment unless  significant symptoms are present. Performed at [facility], [place] [address], [place], [place] [address]      Radiology Studies: No results found.  Scheduled Meds: . sodium chloride   Intravenous Once  . diphenoxylate-atropine  1 tablet Oral QID  . famotidine  20 mg Oral BID  . lipase/protease/amylase  72,000 Units Oral TID AC  . nicotine  14 mg Transdermal Daily  . pregabalin  50 mg Oral TID  . sodium chloride flush  3 mL Intravenous Q12H  . vancomycin  125 mg Oral QID   Followed by  . [START ON 07/31/2018] vancomycin  125 mg Oral BID   Followed by  . [START ON 08/08/2018] vancomycin  125 mg Oral Daily   Followed by  . [START ON 08/15/2018] vancomycin  125 mg Oral QODAY   Followed by  . [START ON 09/12/2018] vancomycin  125 mg Oral Q3 days   Continuous Infusions: . sodium chloride 1,000 mL (07/20/18 0604)  . sodium chloride Stopped (07/17/18 1000)     LOS: 6 days   Marylu Lund, MD [facility] Pager On Amion  If 7PM-7AM, please contact night-coverage 07/20/2018, 3:34 PM

## 2018-07-21 DIAGNOSIS — R509 Fever, unspecified: Secondary | ICD-10-CM | POA: Diagnosis present

## 2018-07-21 DIAGNOSIS — Z8619 Personal history of other infectious and parasitic diseases: Secondary | ICD-10-CM | POA: Diagnosis present

## 2018-07-21 DIAGNOSIS — K766 Portal hypertension: Secondary | ICD-10-CM

## 2018-07-21 DIAGNOSIS — K7030 Alcoholic cirrhosis of liver without ascites: Secondary | ICD-10-CM

## 2018-07-21 DIAGNOSIS — K861 Other chronic pancreatitis: Secondary | ICD-10-CM

## 2018-07-21 DIAGNOSIS — K529 Noninfective gastroenteritis and colitis, unspecified: Secondary | ICD-10-CM

## 2018-07-21 LAB — CBC
HEMATOCRIT: 22 % — AB (ref 36.0–46.0)
HEMOGLOBIN: 7.7 g/dL — AB (ref 12.0–15.0)
MCH: 35 pg — AB (ref 26.0–34.0)
MCHC: 35 g/dL (ref 30.0–36.0)
MCV: 100 fL (ref 78.0–100.0)
Platelets: 40 10*3/uL — ABNORMAL LOW (ref 150–400)
RBC: 2.2 MIL/uL — ABNORMAL LOW (ref 3.87–5.11)
RDW: 20.4 % — ABNORMAL HIGH (ref 11.5–15.5)
WBC: 3.2 10*3/uL — ABNORMAL LOW (ref 4.0–10.5)

## 2018-07-21 LAB — BASIC METABOLIC PANEL
Anion gap: 7 (ref 5–15)
BUN: <5 mg/dL — ABNORMAL LOW (ref 6–20)
CO2: 20 mmol/L — ABNORMAL LOW (ref 22–32)
CREATININE: 0.72 mg/dL (ref 0.44–1.00)
Calcium: 8.3 mg/dL — ABNORMAL LOW (ref 8.9–10.3)
Chloride: 108 mmol/L (ref 98–111)
GFR calc non Af Amer: >60 mL/min (ref >60)
Glucose, Bld: 103 mg/dL — ABNORMAL HIGH (ref 70–99)
Potassium: 3.1 mmol/L — ABNORMAL LOW (ref 3.5–5.1)
Sodium: 135 mmol/L (ref 135–145)

## 2018-07-21 MED ORDER — POTASSIUM CHLORIDE IN NACL 20-0.9 MEQ/L-% IV SOLN
INTRAVENOUS | Status: DC
  Administered 2018-07-21 – 2018-07-23 (×3): via INTRAVENOUS
  Filled 2018-07-21 (×4): qty 1000

## 2018-07-21 MED ORDER — HYDROCODONE-ACETAMINOPHEN 10-325 MG PO TABS
1.0000 | ORAL_TABLET | Freq: Four times a day (QID) | ORAL | Status: DC | PRN
  Administered 2018-07-21 – 2018-07-23 (×4): 1 via ORAL
  Filled 2018-07-21 (×5): qty 1

## 2018-07-21 NOTE — Progress Notes (Addendum)
[facility] Progress Note   Chief Complaint:   Acute on chronic diarrhea  Review of systems: He denies chest pain dyspnea or dysuria SUBJECTIVE:   diarrhea has about stopped Review of systems:   ASSESSMENT AND PLAN:   Acute on chronic diarrhea, improving. 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 GI  Path panel negative too. Stool output significantly improved in last 24 hours if recordings accurate (1.7 liters to 200 ml). Patient does say that diarrhea has slowed significantly. We had stopped Questran, changed lomotil from prn to scheduled dosing, added and then increased pancreatic enzymes. Not sure which of these responsible for improvement -fecal elastase was cancelled by lab (stool too water) -Vanco being continued for total of 10 days (note toxin negative). - Wall thickening of right and transverse colon on constrast CT scan. She had a negative colonoscopy with extensive biopsies in July 2018 (for diarrhea). Repeat colon in Jan 2019 (for fecal transplants) was normal  Hypokalemia, persistent. Mg + barely in normal range on yesterday's lab . Got IV Mg+ yesterday, repeat level not done / available. Electrolyte repletion per primary team.   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 No evidence for hepatic decompensation. Ongoing liver care with Dr. Havery Moros -She has an elevated IgG (non-specific).  -ASMA elevated several year ago but normal on recent repeat lab -Iron sat 95% (all Etoh related?). No ETOH in months.   -ferritin is markedly elevated at 2166.   Chronic macrocytic anemia, probably multifactorial. Baseline Hgb ~ 8-10, down to 6.9 this admission.  Received a unit of blood, hgb stable at 8.8 since. Etiology for decline in Hgb unclear. She has scant blood when wiping but no blood in stool.  -Rechecked B12, folate yesterday. B12 is high at 1453, Folate barely in normal range at 6. May benefit from supplementation.     OBJECTIVE:      Vital signs in last 24 hours: Temp:  [97.8 F (36.6 C)-98.1 F (36.7 C)] 97.8 F (36.6 C) (10/02 0410) Pulse Rate:  [75-80] 80 (10/02 0410) Resp:  [12-16] 12 (10/01 2043) BP: (113-139)/(52-73) 113/73 (10/02 0410) SpO2:  [100 %] 100 % (10/02 0410) Last BM Date: 07/20/18 General:   Alert, well-developed, female in NAD EENT:  Normal hearing, non icteric sclera, conjunctive pink.  Heart:  Regular rate and rhythm; no murmurs. No lower extremity edema Pulm: Normal respiratory effort, lungs CTA bilaterally without wheezes or crackles. Abdomen:  Soft, mildly distended and tympanitic with normal bowel sounds -scattered tenderness,  no masses felt.    She has palpable hepatosplenomegaly Neurologic:  Alert and  oriented x4;  grossly normal neurologically.  No asterixis Psych:  Pleasant, cooperative.  Normal mood and affect.   Intake/Output from previous day: 10/01 0701 - 10/02 0700 In: 3971.1 [P.O.:1480; I.V.:1891.1; IV Piggyback:600] Out: 200 [Stool:200] Intake/Output this shift: No intake/output data recorded.  Lab Results: Recent Labs    07/19/18 0526 07/20/18 0549  WBC 5.2 3.4*  HGB 8.9* 8.8*  HCT 25.4* 25.4*  PLT 50* 46*   BMET Recent Labs    07/19/18 0526 07/20/18 0549  NA 140 140  K 2.9* 3.1*  CL 117* 118*  CO2 15* 16*  GLUCOSE 105* 104*  BUN <5* <5*  CREATININE 0.74 0.72  CALCIUM 8.8* 9.1   LFT Recent Labs    07/20/18 0549  PROT 7.0  ALBUMIN 3.4*  AST 38  ALT 25  ALKPHOS 106  BILITOT 5.3*  Principal Problem:   Abdominal pain Active Problems:   Anxiety and depression   Hypomagnesemia   Pancytopenia (HCC)   Non-intractable vomiting with nausea   Hypokalemia   Prolonged Q-T interval on ECG   Chronic pancreatitis ([place])   Renal insufficiency   Pancreatitis     LOS: 7 days   Tye Savoy ,NP 07/21/2018, 9:55 AM   I have discussed the case with the PA, and that is the plan I formulated. I personally interviewed and  examined the patient.  Her acute on chronic diarrhea is improved today, though it is not clear what intervention has caused that.  She is on vancomycin for suspected recurrent C. difficile, though her stool studies are not conclusive.  She had previously been treated with fecal microbiota transplant. She is also on pancreatic enzyme supplements, but has eaten very little in the last couple of days.  Overall, her chronic diarrhea seems to behave like a small bowel source with malabsorption. Recent small bowel biopsies negative for celiac sprue.  Devious alcohol abuse raises suspicion for exocrine pancreatic insufficiency.  Her cirrhosis is also reportedly from alcohol, but she has significantly elevated iron saturation and ferritin.  This raises suspicion for underlying hemochromatosis.  Our plan is to continue current regimen of pancreatic enzyme supplements, regular diet as tolerated, low-sodium consumption due to cirrhosis, standing dose Lomotil, and complete 10-day course of vancomycin.  HFE gene mutation was ordered because of concerns for underlying hemochromatosis.  This can be followed up in the outpatient setting when she sees Dr. Havery Moros.  Total time 25 minutes, over half spent direct face-to-face time with patient updating on complex condition and answering questions.  Nelida Meuse III Office: [phone]

## 2018-07-21 NOTE — Progress Notes (Signed)
PROGRESS NOTE    Jamie Allen  QVZ:[id number] DOB: 05-24-1971 DOA: 07/13/2018 PCP: Ladell Pier, MD    Brief Narrative:  47 y.o. female with medical history significant of significant ofliver cirrhosis, alcoholism, previous C. difficile colitis, hypothyroidism, pancytopenia, anxiety, and chronic abdominal pain; who presents with complaints of worsening abdominal pain with nausea, vomiting, and diarrhea for 4 days.  Patient had just been hospitalized from 9/14-9/15 with similar symptoms thought to be related to pancreatitis.  However, patient left against Medical advice after she started feeling better after initial therapies.  C. difficile studies at that time were noted to be negative.  For this admission, she's afebrile, stool study is negative for C diff toxin and GI pathology panel is negative too. CT abd pelvis showed findings c/w infectious or inflammatory colitis involving the right and transverse colon. GI consulted. Plan for empiric po vancomycin for total 10 days.   Hb at one point was down to 6.9, s/p 1 unit PRBC, Hb up to 8.8 and has been stable since. Otherwise wbc and platelet stable. She's on electrolyte replacement for hypokalemia and hypomagnesia.   Assessment & Plan:   Principal Problem:   Abdominal pain Active Problems:   Diarrhea, acute on chronic   Anxiety and depression   Pancytopenia (HCC)   Non-intractable vomiting with nausea   Hypokalemia   Chronic pancreatitis (HCC)   Fever   Portal hypertension (HCC)  Abdominal pain, secondary to enteritis vs recurrent Cdiff colitis - Recent CT findings suggestive of acute on chronic pancreatitis vs infectious colitis or both - Continued on Creon as tolerated -pt has been on high rate of IVF at 125 cc/hr since one week ago, given diarrhea nearly subsided, will slow down IVF. -GI following. - Initial concerns of ?suicidal ideations. On further questioning, patient reports abd pain is to the point of "making me  want to jump in front of a car." Patient used this statement in reference only to the degree of pain, NOT of suicidal ideation or intent. Patient has bluntly denied suicidal or homicidal ideations to me. -Cdiff studies neg -empiric treating PO vanc x 10 days, continuing lomotil as scheduled meds -questran stopped by GI  Nausea, vomiting, diarrhea:  -Likely related to above enteritis -Recent Cdiff study neg. -Have ordered stool study, neg -Repeat Cdiff neg -PO vanc continued by GI per above -diarrhea subsided   Prolonged QT interval: resolved  Hypokalemia:  -pending today's BMP -BMP daily  Hypomagnesia:  - resolved   Liver cirrhosis with splenomegaly:  -Patient with history of liver disease related with history of alcoholism. -Recommend continued ETOH cessation  -Ultrasound from 8/28 revealed notes that splenic volume was increased from 1325cm to 1447 cm.   -Patient originally planned to follow-up with surgery to have a splenectomy.   - Continue outpatient follow-up with general surgery for need of splenectomy -Remains stable at present  Pancytopenia:  -Presenting hemoglobin 9.7 and platelet count 67.   -Suspecting cirrhosis as possible contributing factor in thrombocytopenia -Patient had been followed by Dr. Marin Olp and receives intermittent blood transfusions. - hgb down to 6.9 recently, improved to 8.9 after one unit PRBC. Stable since then -Remains hemodynamically stable -Repeat cbc in AM  Hypothyroidism:  -Patient reports previously being on levothyroxine, but has not been on this medication due to needing to follow-up with her primary care provider. - TSH reviewed, within normal limits -Presently stable  Renal insufficiency:  -resolved  GERD - Held PPIs due to prolonged QT interval also hx  of cdiff colitis - Continue Carafate - Will add H2 blocker.  DVT prophylaxis: SCD's Code Status: Full Family Communication: Pt in room, family at  bedside Disposition Plan: Uncertain at this time  Consultants:   [facility]  Procedures:     Antimicrobials: Anti-infectives (From admission, onward)   Start     Dose/Rate Route Frequency Ordered Stop   09/12/18 1000  vancomycin (VANCOCIN) 50 mg/mL oral solution 125 mg     125 mg Oral Every 3 DAYS 07/17/18 1207 10/12/18 0959   08/15/18 1000  vancomycin (VANCOCIN) 50 mg/mL oral solution 125 mg     125 mg Oral Every other day 07/17/18 1207 09/12/18 0959   08/08/18 1000  vancomycin (VANCOCIN) 50 mg/mL oral solution 125 mg     125 mg Oral Daily 07/17/18 1207 08/15/18 0959   07/31/18 2200  vancomycin (VANCOCIN) 50 mg/mL oral solution 125 mg     125 mg Oral 2 times daily 07/17/18 1207 08/07/18 2159   07/17/18 1400  vancomycin (VANCOCIN) 50 mg/mL oral solution 125 mg     125 mg Oral 4 times daily 07/17/18 1207 07/31/18 1359      Subjective: Diarrhea improving and had about 5 episodes since this morning (much less than before per patient). Afebrile. Abdominal pain is constant but this is her baseline and stated her cramps are gone. Had chicken for lunch and stated it did not make abdominal pain worse  Objective: Vitals:   07/20/18 1308 07/20/18 2043 07/21/18 0410 07/21/18 1341  BP: 139/64 (!) 126/52 113/73 138/65  Pulse: 75 79 80 84  Resp: 16 12  (!) 22  Temp: 98.1 F (36.7 C) 98.1 F (36.7 C) 97.8 F (36.6 C) (!) 101.2 F (38.4 C)  TempSrc: Oral Oral Oral Oral  SpO2: 100% 100% 100% 100%  Weight:      Height:        Intake/Output Summary (Last 24 hours) at 07/21/2018 1528 Last data filed at 07/21/2018 1434 Gross per 24 hour  Intake 2824.06 ml  Output -  Net 2824.06 ml   Filed Weights   07/13/18 1645  Weight: 58.1 kg    Examination: General exam: Conversant, in no acute distress Respiratory system: normal chest rise, clear, no audible wheezing Cardiovascular system: regular rhythm, s1-s2 Gastrointestinal system: diffuse tender but no rebound tenderness or guarding  or rigidity, pos BS Central nervous system: No seizures, no tremors Extremities: No cyanosis, no joint deformities Skin: No rashes, no pallor Psychiatry: Affect normal // no auditory hallucinations   Data Reviewed: I have personally reviewed following labs and imaging studies  CBC: Recent Labs  Lab 07/16/18 0536 07/17/18 0510 07/18/18 0523 07/19/18 0526 07/20/18 0549  WBC 5.5 4.5 4.4 5.2 3.4*  HGB 7.5* 7.5* 6.9* 8.9* 8.8*  HCT 21.2* 22.0* 20.0* 25.4* 25.4*  MCV 102.4* 105.3* 105.3* 100.0 100.4*  PLT 48* 53* 46* 50* 46*   Basic Metabolic Panel: Recent Labs  Lab 07/15/18 0507 07/16/18 0536 07/17/18 0510 07/18/18 0523 07/19/18 0526 07/20/18 0549  NA 139 143 144 137 140 140  K 3.4* 2.8* 2.9* 3.1* 2.9* 3.1*  CL 110 113* 120* 115* 117* 118*  CO2 19* 20* 18* 16* 15* 16*  GLUCOSE 164* 144* 98 94 105* 104*  BUN 6 8 6  <5* <5* <5*  CREATININE 0.75 0.69 0.84 0.76 0.74 0.72  CALCIUM 8.3* 8.8* 9.0 8.5* 8.8* 9.1  MG  --   --  1.3* 1.8 1.9 1.7  PHOS 2.8  --  2.7 1.8*  --  3.9   GFR: Estimated Creatinine Clearance: 71.2 mL/min (by C-G formula based on SCr of 0.72 mg/dL). Liver Function Tests: Recent Labs  Lab 07/15/18 0507 07/17/18 0510 07/18/18 0523 07/19/18 0526 07/20/18 0549  AST 52* 62* 43* 39 38  ALT 26 31 24 27 25   ALKPHOS 125 106 89 101 106  BILITOT 4.2* 4.2* 4.3* 4.7* 5.3*  PROT 6.9 6.2* 6.2* 7.1 7.0  ALBUMIN 3.0* 2.9* 2.8* 3.2* 3.4*   Recent Labs  Lab 07/15/18 0507  LIPASE 94*   No results for input(s): AMMONIA in the last 168 hours. Coagulation Profile: No results for input(s): INR, PROTIME in the last 168 hours. Cardiac Enzymes: No results for input(s): CKTOTAL, CKMB, CKMBINDEX, TROPONINI in the last 168 hours. BNP (last 3 results) No results for input(s): PROBNP in the last 8760 hours. HbA1C: No results for input(s): HGBA1C in the last 72 hours. CBG: No results for input(s): GLUCAP in the last 168 hours. Lipid Profile: No results for input(s):  CHOL, HDL, LDLCALC, TRIG, CHOLHDL, LDLDIRECT in the last 72 hours. Thyroid Function Tests: No results for input(s): TSH, T4TOTAL, FREET4, T3FREE, THYROIDAB in the last 72 hours. Anemia Panel: Recent Labs    07/20/18 1122  VITAMINB12 1,453*  FOLATE 6.0   Sepsis Labs: No results for input(s): PROCALCITON, LATICACIDVEN in the last 168 hours.  Recent Results (from the past 240 hour(s))  Gastrointestinal Panel by PCR , Stool     Status: None   Collection Time: 07/14/18 11:02 AM  Result Value Ref Range Status   Campylobacter species NOT DETECTED NOT DETECTED Final   Plesimonas shigelloides NOT DETECTED NOT DETECTED Final   Salmonella species NOT DETECTED NOT DETECTED Final   Yersinia enterocolitica NOT DETECTED NOT DETECTED Final   Vibrio species NOT DETECTED NOT DETECTED Final   Vibrio cholerae NOT DETECTED NOT DETECTED Final   Enteroaggregative E coli (EAEC) NOT DETECTED NOT DETECTED Final   Enteropathogenic E coli (EPEC) NOT DETECTED NOT DETECTED Final   Enterotoxigenic E coli (ETEC) NOT DETECTED NOT DETECTED Final   Shiga like toxin producing E coli (STEC) NOT DETECTED NOT DETECTED Final   Shigella/Enteroinvasive E coli (EIEC) NOT DETECTED NOT DETECTED Final   Cryptosporidium NOT DETECTED NOT DETECTED Final   Cyclospora cayetanensis NOT DETECTED NOT DETECTED Final   Entamoeba histolytica NOT DETECTED NOT DETECTED Final   Giardia lamblia NOT DETECTED NOT DETECTED Final   Adenovirus F40/41 NOT DETECTED NOT DETECTED Final   Astrovirus NOT DETECTED NOT DETECTED Final   Norovirus GI/GII NOT DETECTED NOT DETECTED Final   Rotavirus A NOT DETECTED NOT DETECTED Final   Sapovirus (I, II, IV, and V) NOT DETECTED NOT DETECTED Final    Comment: Performed at [facility], [address] [place], [place] [address]  C difficile quick scan w PCR reflex     Status: Abnormal   Collection Time: 07/16/18 11:15 AM  Result Value Ref Range Status   C Diff antigen POSITIVE (A) NEGATIVE Final    C Diff toxin NEGATIVE NEGATIVE Final   C Diff interpretation Results are indeterminate. See PCR results.  Final    Comment: Performed at [facility], [place] [address], [place], [place] [address]  C. Diff by PCR, Reflexed     Status: None   Collection Time: 07/16/18 11:15 AM  Result Value Ref Range Status   Toxigenic C. Difficile by PCR NEGATIVE NEGATIVE Final    Comment: Patient is colonized with non toxigenic C. difficile. May not need treatment  unless significant symptoms are present. Performed at [facility], [place] [address] [place], [place] [address]      Radiology Studies: No results found.  Scheduled Meds: . sodium chloride   Intravenous Once  . diphenoxylate-atropine  1 tablet Oral QID  . famotidine  20 mg Oral BID  . lipase/protease/amylase  72,000 Units Oral TID AC  . nicotine  14 mg Transdermal Daily  . pregabalin  50 mg Oral TID  . sodium chloride flush  3 mL Intravenous Q12H  . vancomycin  125 mg Oral QID   Followed by  . [START ON 07/31/2018] vancomycin  125 mg Oral BID   Followed by  . [START ON 08/08/2018] vancomycin  125 mg Oral Daily   Followed by  . [START ON 08/15/2018] vancomycin  125 mg Oral QODAY   Followed by  . [START ON 09/12/2018] vancomycin  125 mg Oral Q3 days   Continuous Infusions: . sodium chloride Stopped (07/17/18 1000)     LOS: 7 days   Paticia Stack, MD [facility] Pager On [place]  If 7PM-7AM, please contact night-coverage 07/21/2018, 3:28 PM

## 2018-07-22 DIAGNOSIS — K860 Alcohol-induced chronic pancreatitis: Secondary | ICD-10-CM

## 2018-07-22 DIAGNOSIS — K8689 Other specified diseases of pancreas: Secondary | ICD-10-CM

## 2018-07-22 LAB — BASIC METABOLIC PANEL
ANION GAP: 7 (ref 5–15)
BUN: <5 mg/dL — ABNORMAL LOW (ref 6–20)
CO2: 21 mmol/L — AB (ref 22–32)
Calcium: 8.8 mg/dL — ABNORMAL LOW (ref 8.9–10.3)
Chloride: 111 mmol/L (ref 98–111)
Creatinine, Ser: 0.7 mg/dL (ref 0.44–1.00)
GFR calc non Af Amer: >60 mL/min (ref >60)
Glucose, Bld: 126 mg/dL — ABNORMAL HIGH (ref 70–99)
Potassium: 3.3 mmol/L — ABNORMAL LOW (ref 3.5–5.1)
SODIUM: 139 mmol/L (ref 135–145)

## 2018-07-22 LAB — CBC
HCT: 22.3 % — ABNORMAL LOW (ref 36.0–46.0)
HEMOGLOBIN: 7.7 g/dL — AB (ref 12.0–15.0)
MCH: 35.2 pg — AB (ref 26.0–34.0)
MCHC: 34.5 g/dL (ref 30.0–36.0)
MCV: 101.8 fL — ABNORMAL HIGH (ref 78.0–100.0)
Platelets: 39 10*3/uL — ABNORMAL LOW (ref 150–400)
RBC: 2.19 MIL/uL — AB (ref 3.87–5.11)
RDW: 20.9 % — ABNORMAL HIGH (ref 11.5–15.5)
WBC: 3.3 10*3/uL — AB (ref 4.0–10.5)

## 2018-07-22 MED ORDER — DIPHENOXYLATE-ATROPINE 2.5-0.025 MG PO TABS: 1.0000 | ORAL_TABLET | Freq: Two times a day (BID) | ORAL | Status: DC | PRN

## 2018-07-22 MED ORDER — VANCOMYCIN 50 MG/ML ORAL SOLUTION
125.0000 mg | Freq: Four times a day (QID) | ORAL | Status: DC
  Administered 2018-07-22 – 2018-07-23 (×4): 125 mg via ORAL
  Filled 2018-07-22 (×5): qty 2.5

## 2018-07-22 MED ORDER — POTASSIUM CHLORIDE CRYS ER 20 MEQ PO TBCR
40.0000 meq | EXTENDED_RELEASE_TABLET | Freq: Once | ORAL | Status: AC
  Administered 2018-07-22: 40 meq via ORAL
  Filled 2018-07-22: qty 2

## 2018-07-22 MED ORDER — HYDROMORPHONE HCL 1 MG/ML IJ SOLN
1.0000 mg | Freq: Four times a day (QID) | INTRAMUSCULAR | Status: DC | PRN
  Administered 2018-07-22 – 2018-07-23 (×4): 1 mg via INTRAVENOUS
  Filled 2018-07-22 (×4): qty 1

## 2018-07-22 NOTE — Plan of Care (Signed)
  Problem: Activity: Goal: Risk for activity intolerance will decrease Outcome: Progressing   Problem: Nutrition: Goal: Adequate nutrition will be maintained Outcome: Progressing   Problem: Safety: Goal: Ability to remain free from injury will improve Outcome: Progressing   

## 2018-07-22 NOTE — Progress Notes (Signed)
PROGRESS NOTE    Jamie Allen  YJE:[id number] DOB: 01-13-1971 DOA: 07/13/2018 PCP: Ladell Pier, MD    Brief Narrative:  47 y.o. female with medical history significant of significant ofliver cirrhosis, alcoholism, previous C. difficile colitis, hypothyroidism, pancytopenia, anxiety, and chronic abdominal pain; who presents with complaints of worsening abdominal pain with nausea, vomiting, and diarrhea for 4 days.  Patient had just been hospitalized from 9/14-9/15 with similar symptoms thought to be related to pancreatitis.  However, patient left against Medical advice after she started feeling better after initial therapies.  C. difficile studies at that time were noted to be negative.  For this admission, she's afebrile, stool study is negative for C diff toxin and GI pathology panel is negative too. CT abd pelvis showed findings c/w infectious or inflammatory colitis involving the right and transverse colon. GI consulted. Plan for empiric po vancomycin for total 10 days.   Hb at one point was down to 6.9, s/p 1 unit PRBC, Hb up to 8.8 then down to 7.7. Otherwise mild neutropenia and thrombocytopenia stable. She's on electrolyte replacement for hypokalemia and hypomagnesia.   Assessment & Plan:   Principal Problem:   Abdominal pain Active Problems:   Cirrhosis (HCC)   Diarrhea, acute on chronic   Anxiety and depression   Pancytopenia (HCC)   Non-intractable vomiting with nausea   Hypokalemia   Chronic pancreatitis (HCC)   Fever   Portal hypertension (HCC)   History of Clostridioides difficile colitis  Abdominal pain, secondary to enteritis vs recurrent Cdiff colitis - Recent CT findings suggestive of acute on chronic pancreatitis vs infectious colitis or both - Continued on Creon as tolerated -continue gentle IVF with potassium supplement. -GI following. Appreciate GI input -Cdiff studies neg -empiric treating PO vanc x 10 days, continuing lomotil as scheduled  meds -questran stopped by GI  Nausea, vomiting, diarrhea:  -Likely related to above enteritis -Recent Cdiff study neg. -Have ordered stool study, neg -Repeat Cdiff neg -PO vanc continued by GI per above -diarrhea subsided   Prolonged QT interval: resolved  Hypokalemia:  -pending today's BMP -BMP daily  Hypomagnesia:  - resolved   Liver cirrhosis with splenomegaly:  -Patient with history of liver disease related with history of alcoholism. -Recommend continued ETOH cessation  -Ultrasound from 8/28 revealed notes that splenic volume was increased from 1325cm to 1447 cm.   -Patient originally planned to follow-up with surgery to have a splenectomy.   - Continue outpatient follow-up with general surgery for need of splenectomy -Remains stable at present  Pancytopenia:  -Presenting hemoglobin 7.7 and platelet count 39.   -Suspecting cirrhosis as possible contributing factor in thrombocytopenia -Patient had been followed by Dr. Marin Olp and receives intermittent blood transfusions. - hgb down to 6.9 recently, improved to 8.9 after one unit PRBC. Down to 7.7 for 2 days; no bleeding -Remains hemodynamically stable -Repeat cbc in AM  Hypothyroidism:  -Patient reports previously being on levothyroxine, but has not been on this medication due to needing to follow-up with her primary care provider. - TSH reviewed, within normal limits -Presently stable  Renal insufficiency:  -resolved  GERD - Held PPIs due to prolonged QT interval also hx of cdiff colitis - Continue Carafate - Will add H2 blocker.  DVT prophylaxis: SCD's Code Status: Full Family Communication: Pt in room, family at bedside Disposition Plan: Uncertain at this time  Consultants:   [facility]  Procedures:     Antimicrobials: Anti-infectives (From admission, onward)   Start  Dose/Rate Route Frequency Ordered Stop   09/12/18 1000  vancomycin (VANCOCIN) 50 mg/mL oral solution 125 mg   Status:  Discontinued     125 mg Oral Every 3 DAYS 07/17/18 1207 07/22/18 0931   08/15/18 1000  vancomycin (VANCOCIN) 50 mg/mL oral solution 125 mg  Status:  Discontinued     125 mg Oral Every other day 07/17/18 1207 07/22/18 0931   08/08/18 1000  vancomycin (VANCOCIN) 50 mg/mL oral solution 125 mg  Status:  Discontinued     125 mg Oral Daily 07/17/18 1207 07/22/18 0931   07/31/18 2200  vancomycin (VANCOCIN) 50 mg/mL oral solution 125 mg  Status:  Discontinued     125 mg Oral 2 times daily 07/17/18 1207 07/22/18 0931   07/22/18 1200  vancomycin (VANCOCIN) 50 mg/mL oral solution 125 mg     125 mg Oral Every 6 hours 07/22/18 0931 07/26/18 1159   07/17/18 1400  vancomycin (VANCOCIN) 50 mg/mL oral solution 125 mg  Status:  Discontinued     125 mg Oral 4 times daily 07/17/18 1207 07/22/18 0931      Subjective: No diarrhea today. Afebrile. Reported to have headache. Abdominal pain is constant and she reported bad cramps this morning that made her vomit. RN reported pt having round the clock request of iv dilaudid.   Objective: Vitals:   07/21/18 1615 07/21/18 2111 07/22/18 0640 07/22/18 1330  BP:  125/61 (!) 109/50 (!) 112/58  Pulse:  78 85 83  Resp:  18 18 16   Temp: 99.1 F (37.3 C) 98.6 F (37 C) 98.7 F (37.1 C) 98.1 F (36.7 C)  TempSrc: Oral Oral Oral Oral  SpO2:  99% 99% 99%  Weight:      Height:        Intake/Output Summary (Last 24 hours) at 07/22/2018 1511 Last data filed at 07/22/2018 0300 Gross per 24 hour  Intake 709.55 ml  Output -  Net 709.55 ml   Filed Weights   07/13/18 1645  Weight: 58.1 kg    Examination: General exam: Conversant, in no acute distress. She looked 48 years old than her real age! Respiratory system: normal chest rise, clear, no audible wheezing Cardiovascular system: regular rhythm, s1-s2 Gastrointestinal system: diffuse tender but no rebound tenderness or guarding or rigidity, pos BS Central nervous system: No seizures, no  tremors Extremities: No cyanosis, no joint deformities Skin: No rashes, no pallor Psychiatry: Affect normal; no auditory hallucinations   Data Reviewed: I have personally reviewed following labs and imaging studies  CBC: Recent Labs  Lab 07/18/18 0523 07/19/18 0526 07/20/18 0549 07/21/18 1605 07/22/18 0520  WBC 4.4 5.2 3.4* 3.2* 3.3*  HGB 6.9* 8.9* 8.8* 7.7* 7.7*  HCT 20.0* 25.4* 25.4* 22.0* 22.3*  MCV 105.3* 100.0 100.4* 100.0 101.8*  PLT 46* 50* 46* 40* 39*   Basic Metabolic Panel: Recent Labs  Lab 07/17/18 0510 07/18/18 0523 07/19/18 0526 07/20/18 0549 07/21/18 1605 07/22/18 0520  NA 144 137 140 140 135 139  K 2.9* 3.1* 2.9* 3.1* 3.1* 3.3*  CL 120* 115* 117* 118* 108 111  CO2 18* 16* 15* 16* 20* 21*  GLUCOSE 98 94 105* 104* 103* 126*  BUN 6 <5* <5* <5* <5* <5*  CREATININE 0.84 0.76 0.74 0.72 0.72 0.70  CALCIUM 9.0 8.5* 8.8* 9.1 8.3* 8.8*  MG 1.3* 1.8 1.9 1.7  --   --   PHOS 2.7 1.8*  --  3.9  --   --    GFR: Estimated Creatinine Clearance:  71.2 mL/min (by C-G formula based on SCr of 0.7 mg/dL). Liver Function Tests: Recent Labs  Lab 07/17/18 0510 07/18/18 0523 07/19/18 0526 07/20/18 0549  AST 62* 43* 39 38  ALT 31 24 27 25   ALKPHOS 106 89 101 106  BILITOT 4.2* 4.3* 4.7* 5.3*  PROT 6.2* 6.2* 7.1 7.0  ALBUMIN 2.9* 2.8* 3.2* 3.4*   No results for input(s): LIPASE, AMYLASE in the last 168 hours. No results for input(s): AMMONIA in the last 168 hours. Coagulation Profile: No results for input(s): INR, PROTIME in the last 168 hours. Cardiac Enzymes: No results for input(s): CKTOTAL, CKMB, CKMBINDEX, TROPONINI in the last 168 hours. BNP (last 3 results) No results for input(s): PROBNP in the last 8760 hours. HbA1C: No results for input(s): HGBA1C in the last 72 hours. CBG: No results for input(s): GLUCAP in the last 168 hours. Lipid Profile: No results for input(s): CHOL, HDL, LDLCALC, TRIG, CHOLHDL, LDLDIRECT in the last 72 hours. Thyroid Function  Tests: No results for input(s): TSH, T4TOTAL, FREET4, T3FREE, THYROIDAB in the last 72 hours. Anemia Panel: Recent Labs    07/20/18 1122  VITAMINB12 1,453*  FOLATE 6.0   Sepsis Labs: No results for input(s): PROCALCITON, LATICACIDVEN in the last 168 hours.  Recent Results (from the past 240 hour(s))  Gastrointestinal Panel by PCR , Stool     Status: None   Collection Time: 07/14/18 11:02 AM  Result Value Ref Range Status   Campylobacter species NOT DETECTED NOT DETECTED Final   Plesimonas shigelloides NOT DETECTED NOT DETECTED Final   Salmonella species NOT DETECTED NOT DETECTED Final   Yersinia enterocolitica NOT DETECTED NOT DETECTED Final   Vibrio species NOT DETECTED NOT DETECTED Final   Vibrio cholerae NOT DETECTED NOT DETECTED Final   Enteroaggregative E coli (EAEC) NOT DETECTED NOT DETECTED Final   Enteropathogenic E coli (EPEC) NOT DETECTED NOT DETECTED Final   Enterotoxigenic E coli (ETEC) NOT DETECTED NOT DETECTED Final   Shiga like toxin producing E coli (STEC) NOT DETECTED NOT DETECTED Final   Shigella/Enteroinvasive E coli (EIEC) NOT DETECTED NOT DETECTED Final   Cryptosporidium NOT DETECTED NOT DETECTED Final   Cyclospora cayetanensis NOT DETECTED NOT DETECTED Final   Entamoeba histolytica NOT DETECTED NOT DETECTED Final   Giardia lamblia NOT DETECTED NOT DETECTED Final   Adenovirus F40/41 NOT DETECTED NOT DETECTED Final   Astrovirus NOT DETECTED NOT DETECTED Final   Norovirus GI/GII NOT DETECTED NOT DETECTED Final   Rotavirus A NOT DETECTED NOT DETECTED Final   Sapovirus (I, II, IV, and V) NOT DETECTED NOT DETECTED Final    Comment: Performed at [facility], [address] [place], [place] [address]  C difficile quick scan w PCR reflex     Status: Abnormal   Collection Time: 07/16/18 11:15 AM  Result Value Ref Range Status   C Diff antigen POSITIVE (A) NEGATIVE Final   C Diff toxin NEGATIVE NEGATIVE Final   C Diff interpretation Results are  indeterminate. See PCR results.  Final    Comment: Performed at [facility], [place] [address], [place], [place] [address]  C. Diff by PCR, Reflexed     Status: None   Collection Time: 07/16/18 11:15 AM  Result Value Ref Range Status   Toxigenic C. Difficile by PCR NEGATIVE NEGATIVE Final    Comment: Patient is colonized with non toxigenic C. difficile. May not need treatment unless significant symptoms are present. Performed at [facility], [place] [address]  [place], [place] [address]      Radiology Studies: No results found.  Scheduled Meds: . sodium chloride   Intravenous Once  . famotidine  20 mg Oral BID  . lipase/protease/amylase  72,000 Units Oral TID AC  . nicotine  14 mg Transdermal Daily  . pregabalin  50 mg Oral TID  . sodium chloride flush  3 mL Intravenous Q12H  . vancomycin  125 mg Oral Q6H   Continuous Infusions: . sodium chloride Stopped (07/17/18 1000)  . 0.9 % NaCl with KCl 20 mEq / L 60 mL/hr at 07/22/18 1278     LOS: 8 days   Paticia Stack, MD [facility] Pager On [place]  If 7PM-7AM, please contact night-coverage 07/22/2018, 3:11 PM

## 2018-07-22 NOTE — Progress Notes (Signed)
[facility] Progress Note   Chief Complaint:   diarrhea    SUBJECTIVE:    Diarrhea has stopped, had two BMs yesterday. She had some vomiting yesterday    ASSESSMENT AND PLAN:   Acute on chronic diarrhea. Resolved. Wall thickening of right and transverse colon on contrast CT scan of unclear etiology. Of note, she had a negative colonoscopy with extensive biopsies in July 2018 (for diarrhea). Repeat colon in Jan 2019 (for fecal transplant) was normal as well.  -Stool studies are negative. 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  Vancomycin really not justified but hate to discontinue it since she is half way done with treatment and diarrhea resolved.  -She may have some degree of pancreatic insuff. Fecal elastase was cancelled by lab (stool too water)   -By exam I am concerned she could be developing an ileus now and she did vomit yesterday. Therefore will reduce lomotil dose to prn.  -encouraged her to try and eat (will give low fat diet) and take pancreatic enzymes.  -Ongoing electrolyte repletion -Hopefully home tomorrow   Hypokalemia, persistent but improved. Mg + barely in normal range. Mg+ low, repleted a couple of days ago. Will repeat level in anticipation of upcoming discharge.   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 No evidence forhepaticdecompensation. Ongoing liver care with Dr. Havery Moros.  -She hasan elevated IgG (non-specific). -ASMAelevatedseveral year agobut normal on recent repeatlab -Iron sat 95% (all Etoh related?). No ETOH in months. Ferritin is markedly elevated at 2166.I spoke with her primary GI, Dr. Havery Moros yesterday. He was reviewing case and called to let me know that patient had already been worked up for hemochromatosis so labs for such were not repeated  Pancytopenia with chronic macrocytic anemia, probably multifactorial. Baseline Hgb ~ 8-10, down to 6.9this  admission.Received a unit of blood. Hgb drifting again ( 8.0>>>7.7), . Etiology for decline in Hgb unclear. She has scant blood when wiping but no blood in stool.  -Rechecked B12, folate. . B12 is high at 1453, Folate barely in normal range at 6. May benefit from supplementation.    OBJECTIVE:     Vital signs in last 24 hours: Temp:  [98.6 F (37 C)-101.2 F (38.4 C)] 98.7 F (37.1 C) (10/03 0640) Pulse Rate:  [78-85] 85 (10/03 0640) Resp:  [18-22] 18 (10/03 0640) BP: (109-138)/(50-65) 109/50 (10/03 0640) SpO2:  [99 %-100 %] 99 % (10/03 0640) Last BM Date: 07/20/18 General:   Alert, well-developed, female in NAD EENT:  Normal hearing, non icteric sclera, conjunctive pink.  Heart:  Regular rate and rhythm; no murmurs. No lower extremity edema Pulm: Normal respiratory effort Abdomen:  Soft, mildly distended, a few abnormal bowel sounds. Mild diffuse tenderness.      Neurologic:  Alert and  oriented x4;  grossly normal neurologically. Psych:  Pleasant, cooperative.  Normal mood and affect.   Intake/Output from previous day: 10/02 0701 - 10/03 0700 In: 2007.1 [P.O.:360; I.V.:1647.1] Out: -  Intake/Output this shift: No intake/output data recorded.  Lab Results: Recent Labs    07/20/18 0549 07/21/18 1605 07/22/18 0520  WBC 3.4* 3.2* 3.3*  HGB 8.8* 7.7* 7.7*  HCT 25.4* 22.0* 22.3*  PLT 46* 40* 39*   BMET Recent Labs    07/20/18 0549 07/21/18 1605 07/22/18 0520  NA 140 135 139  K 3.1* 3.1* 3.3*  CL 118* 108 111  CO2 16* 20* 21*  GLUCOSE 104* 103* 126*  BUN <5* <5* <  5*  CREATININE 0.72 0.72 0.70  CALCIUM 9.1 8.3* 8.8*   LFT Recent Labs    07/20/18 0549  PROT 7.0  ALBUMIN 3.4*  AST 38  ALT 25  ALKPHOS 106  BILITOT 5.3*     Principal Problem:   Abdominal pain Active Problems:   Cirrhosis (HCC)   Diarrhea, acute on chronic   Anxiety and depression   Pancytopenia (HCC)   Non-intractable vomiting with nausea   Hypokalemia   Chronic pancreatitis  (HCC)   Fever   Portal hypertension (HCC)   History of Clostridioides difficile colitis     LOS: 8 days   Jamie Allen ,NP 07/22/2018, 9:22 AM    Attending physician's note   I have taken an interval history, reviewed the chart and examined the patient. I agree with the Advanced Practitioner's note, impression and recommendations.  No longer having diarrhea, she has not had any bowel movement today. Feels full and has nausea.  Has been straining to have a bowel movement but feels nothing is coming out. She is on high-dose narcotics, consider tapering down Stopped Lomotil Continue Creon Consider stopping vancomycin as toxin and PCR were negative.  Diarrhea likely secondary to pancreatic insufficiency as improved significantly with addition of Creon.  Fecal elastase could not be quantified as the stool sample was watery. EtOH cirrhosis with no decompensation.  Elevated ferritin and iron saturation could be secondary to EtOH. Avoid multivitamins with iron Follow-up with GI office Dr. Havery Moros or extender in 4 to 6 weeks, will arrange for it. We will sign off, available if have any questions    [name] , MD [phone]

## 2018-07-23 DIAGNOSIS — K8689 Other specified diseases of pancreas: Secondary | ICD-10-CM

## 2018-07-23 LAB — CBC
HCT: 25.5 % — ABNORMAL LOW (ref 36.0–46.0)
Hemoglobin: 8.5 g/dL — ABNORMAL LOW (ref 12.0–15.0)
MCH: 35 pg — AB (ref 26.0–34.0)
MCHC: 33.3 g/dL (ref 30.0–36.0)
MCV: 104.9 fL — AB (ref 78.0–100.0)
PLATELETS: 42 10*3/uL — AB (ref 150–400)
RBC: 2.43 MIL/uL — ABNORMAL LOW (ref 3.87–5.11)
RDW: 20.9 % — AB (ref 11.5–15.5)
WBC: 2.6 10*3/uL — AB (ref 4.0–10.5)

## 2018-07-23 LAB — BASIC METABOLIC PANEL
Anion gap: 12 (ref 5–15)
BUN: <5 mg/dL — ABNORMAL LOW (ref 6–20)
CO2: 20 mmol/L — AB (ref 22–32)
CREATININE: 0.61 mg/dL (ref 0.44–1.00)
Calcium: 9.2 mg/dL (ref 8.9–10.3)
Chloride: 103 mmol/L (ref 98–111)
GFR calc non Af Amer: >60 mL/min (ref >60)
GLUCOSE: 122 mg/dL — AB (ref 70–99)
Potassium: 3.8 mmol/L (ref 3.5–5.1)
Sodium: 135 mmol/L (ref 135–145)

## 2018-07-23 LAB — MAGNESIUM: Magnesium: 1.4 mg/dL — ABNORMAL LOW (ref 1.7–2.4)

## 2018-07-23 MED ORDER — FOLIC ACID 1 MG PO TABS: 1.0000 mg | ORAL_TABLET | Freq: Every day | ORAL | 3 refills | Status: AC

## 2018-07-23 MED ORDER — PANCRELIPASE (LIP-PROT-AMYL) 36000-114000 UNITS PO CPEP: 72000.0000 [IU] | ORAL_CAPSULE | Freq: Three times a day (TID) | ORAL | 3 refills | Status: DC

## 2018-07-23 MED ORDER — FOLIC ACID 1 MG PO TABS
1.0000 mg | ORAL_TABLET | Freq: Every day | ORAL | Status: DC
  Administered 2018-07-23: 1 mg via ORAL
  Filled 2018-07-23: qty 1

## 2018-07-23 MED ORDER — MAGNESIUM SULFATE 2 GM/50ML IV SOLN
2.0000 g | Freq: Once | INTRAVENOUS | Status: AC
  Administered 2018-07-23: 2 g via INTRAVENOUS
  Filled 2018-07-23: qty 50

## 2018-07-23 MED ORDER — MAGNESIUM OXIDE -MG SUPPLEMENT 500 MG PO TABS: 500.0000 mg | ORAL_TABLET | Freq: Every day | ORAL | 0 refills | Status: DC

## 2018-07-23 MED ORDER — NICOTINE 14 MG/24HR TD PT24: 14.0000 mg | MEDICATED_PATCH | Freq: Every day | TRANSDERMAL | 0 refills | Status: DC

## 2018-07-23 MED FILL — traMADol HCL 50 MG TABS: 50 | 7 days supply | Qty: 14 | Fill #2

## 2018-07-23 NOTE — Care Management Note (Signed)
Case Management Note  Patient Details  Name: Jamie Allen MRN: [id number] Date of Birth: 02-23-1971  Subjective/Objective:  No CM needs.                  Action/Plan:dc home.   Expected Discharge Date:  07/23/18               Expected Discharge Plan:  Home/Self Care  In-House Referral:     Discharge planning Services  CM Consult  Post Acute Care Choice:    Choice offered to:     DME Arranged:    DME Agency:     HH Arranged:    HH Agency:     Status of Service:  Completed, signed off  If discussed at [employer] of Stay Meetings, dates discussed:    Additional Comments:  Dessa Phi, RN 07/23/2018, 10:17 AM

## 2018-07-23 NOTE — Discharge Summary (Signed)
Physician Discharge Summary  Jamie Allen XIP:[id number] DOB: 26-Apr-1971 DOA: 07/13/2018  PCP: Ladell Pier, MD  Admit date: 07/13/2018 Discharge date: 07/23/2018  Admitted From: home Disposition:  home  Recommendations for Outpatient Follow-up:  1. Follow up with PCP in 2-3 weeks 2. Follow up with Dr. Havery Moros in 4-6 weeks 3. Daily magnesium supplement and Creon with all meals  Home Health:home Equipment/Devices:none Discharge Condition:stable CODE STATUS:full Diet recommendation: Heart Healthy  Brief/Interim Summary: 47 y.o.femalewith medical history significant ofsignificant ofliver cirrhosis, alcoholism, previous C. difficile colitis, hypothyroidism,pancytopenia,anxiety,and chronic abdominal pain;who presents with complaints of worsening abdominal pain with nausea, vomiting, and diarrhea for 4 days. Patient had just been hospitalized from 9/14-9/15with similar symptoms thought to be related to pancreatitis. However, patient left against Medical advice after she started feeling better after initial therapies. C. difficile studies at that time were noted to be negative.  For this admission, she's afebrile,stool study is negative for C diff toxin and GI pathology panel is negative too. CT abd pelvis showed findings c/w infectious or inflammatory colitis involving the right and transverse colon. GI consulted. Planned for empiric po vancomycin for total 10 days. After 8 days of hospital stay with po vancomycin, Creo supplement with every meal, and other supportive care, eventually her diarrhea has stopped and upon discharge she has not had BM for 24 hours. Gi strongly suspected her having abdominal pain/diarrhea/vomiting mainly from pancreatic insufficiency, and has doubled her home Creon dose.   Hb at one point was down to 6.9, s/p 1 unit PRBC, Hb up to 8.8 then 8.5 upon discharge. Otherwise mild neutropenia and thrombocytopenia stable.   She's on electrolyte  replacement for hypokalemia and hypomagnesia. She's prescribed with mag-ox upon discharge.  Patient is heavily narcotics dependent and exhibit drug seeking behavior in hospital and was almost on round the clock iv dilaudid. She's back on PRN Norco and tramadol upon discharge.  Discharge Diagnoses:  Principal Problem:   Abdominal pain Active Problems:   Cirrhosis ([place])   Anxiety and depression   Hypomagnesemia   Pancytopenia ([facility])   Chronic pancreatitis ([facility])   Portal hypertension ([facility])   History of Clostridioides difficile colitis   Pancreatic insufficiency    Discharge Instructions  Discharge Instructions    Call MD for:   Complete by:  As directed    If intractable nausea, vomiting, abdominal pain, or worsening diarrhea   Discharge instructions   Complete by:  As directed    Follow up with Dr. Havery Moros in 4-6 weeks Avoid large meals; stay with low fat diet Take pancreatic enzymes with every meals (be aware the dose has doubled) PCP follow up in 2-3 weeks Take magnesium supplement every day   Increase activity slowly   Complete by:  As directed      Allergies as of 07/23/2018      Reactions   Iohexol Hives, Itching, Swelling   Lactose Intolerance (gi) Diarrhea, Nausea Only   Other Other (See Comments)   Spicy foods "tear up my stomach"   Vancomycin Itching   Nose itching   Morphine And Related Itching, Other (See Comments)   Can tolerate with Benadryl      Medication List    STOP taking these medications   vitamin B-12 1000 MCG tablet Commonly known as:  CYANOCOBALAMIN     TAKE these medications   esomeprazole 20 MG capsule Commonly known as:  NEXIUM Take 20 mg by mouth daily.   famotidine 20 MG tablet Commonly known as:  PEPCID  Take 1 tablet (20 mg total) by mouth daily.   folic acid 1 MG tablet Commonly known as:  FOLVITE Take 1 tablet (1 mg total) by mouth daily. Start taking on:  07/24/2018   lipase/protease/amylase 36000 UNITS Cpep  capsule Commonly known as:  CREON Take 2 capsules (72,000 Units total) by mouth 3 (three) times daily before meals. What changed:  how much to take   Magnesium Oxide 500 MG Tabs Take 1 tablet (500 mg total) by mouth daily.   Melatonin 10 MG Tabs Take 10 mg by mouth at bedtime as needed (for sleep).   metoCLOPramide 10 MG tablet Commonly known as:  REGLAN Take 1 tablet (10 mg total) by mouth every 8 (eight) hours as needed for nausea.   nicotine 14 mg/24hr patch Commonly known as:  NICODERM CQ - dosed in mg/24 hours Place 1 patch (14 mg total) onto the skin daily. Start taking on:  07/24/2018   ondansetron 4 MG disintegrating tablet Commonly known as:  ZOFRAN-ODT Take 1 tablet (4 mg total) by mouth every 8 (eight) hours as needed for nausea or vomiting.   ondansetron 4 MG tablet Commonly known as:  ZOFRAN Take 1 tablet (4 mg total) by mouth every 6 (six) hours as needed for nausea.   oxyCODONE-acetaminophen 5-325 MG tablet Commonly known as:  PERCOCET/ROXICET Take 1 tablet by mouth every 4 (four) hours as needed. What changed:  reasons to take this   potassium chloride 8 MEQ tablet Commonly known as:  KLOR-CON Take 2 tablets (16 mEq total) by mouth daily. What changed:    how much to take  when to take this  reasons to take this   pregabalin 50 MG capsule Commonly known as:  LYRICA Take 1 capsule (50 mg total) by mouth 3 (three) times daily.   sertraline 50 MG tablet Commonly known as:  ZOLOFT Take 1 tablet (50 mg total) by mouth daily.   sucralfate 1 GM/10ML suspension Commonly known as:  CARAFATE Take 10 mLs (1 g total) by mouth 4 (four) times daily -  with meals and at bedtime.   traMADol 50 MG tablet Commonly known as:  ULTRAM Take 1 tablet (50 mg total) by mouth 2 (two) times daily as needed (pain). To fill on or after 06/24/2018      Follow-up Information    Armbruster, Carlota Raspberry, MD Follow up in 4 week(s).   Specialty:  Gastroenterology Contact  information: [place] [address] [phone]        Ladell Pier, MD Follow up in 2 week(s).   Specialty:  Internal Medicine Contact information: [address] [place] [address] [phone]          Allergies  Allergen Reactions  . Iohexol Hives, Itching and Swelling  . Lactose Intolerance (Gi) Diarrhea and Nausea Only  . Other Other (See Comments)    Spicy foods "tear up my stomach"  . Vancomycin Itching    Nose itching  . Morphine And Related Itching and Other (See Comments)    Can tolerate with Benadryl    Consultations: GI Dr. Silverio Decamp  Procedures/Studies: Ct Abdomen Pelvis Wo Contrast  Result Date: 07/03/2018 CLINICAL DATA:  Abdominal pain with diarrhea and fever beginning 5 days ago. EXAM: CT ABDOMEN AND PELVIS WITHOUT CONTRAST TECHNIQUE: Multidetector CT imaging of the abdomen and pelvis was performed following the standard protocol without IV contrast. COMPARISON:  01/22/2018 FINDINGS: Lower chest: Lung bases are within normal. Hepatobiliary: Previous  cholecystectomy. Mild nodularity to the surface of the left lobe of the liver. No focal liver mass. Biliary tree is normal. Pancreas: Normal. Spleen: Splenomegaly. Adrenals/Urinary Tract: Adrenal glands are normal. Right kidney is normal. Mild left renal atrophy unchanged. No hydronephrosis or nephrolithiasis. Ureters and bladder are normal. Stomach/Bowel: Stomach and small bowel are normal. Appendix is normal. Colon is unremarkable. Vascular/Lymphatic: Minimal calcified plaque over the abdominal aorta. Few small lymph nodes versus varices in the region of the gastrohepatic ligament. Otherwise, no definite adenopathy. Reproductive: Within normal. Other: Mild fat stranding just inferior/posterior to the head of the pancreas and adjacent the distal duodenum and proximal jejunum in the mid abdomen of uncertain clinical significance. Possible mild wall thickening of several proximal  jejunal loops. Findings may be due to a regional enteritis of infectious or inflammatory nature pancreatitis is possible. Musculoskeletal: Unremarkable. IMPRESSION: Subtle mesenteric fat stranding inferior/posterior to the pancreatic head and adjacent the proximal small bowel with mild associated small bowel wall thickening. Findings may be due to a regional enteritis of infectious or inflammatory nature. Pancreatitis is less likely. Findings suggesting mild cirrhosis with stable splenomegaly. Mild left renal atrophy. Aortic Atherosclerosis (ICD10-I70.0). Electronically Signed   By: Marin Olp M.D.   On: 07/03/2018 20:18   Ct Abdomen Pelvis W Contrast  Result Date: 07/15/2018 CLINICAL DATA:  Acute epigastric abdominal pain. EXAM: CT ABDOMEN AND PELVIS WITH CONTRAST TECHNIQUE: Multidetector CT imaging of the abdomen and pelvis was performed using the standard protocol following bolus administration of intravenous contrast. CONTRAST:  15mL OMNIPAQUE IOHEXOL 300 MG/ML  SOLN COMPARISON:  CT scan of July 03, 2018. FINDINGS: Lower chest: No acute abnormality. Hepatobiliary: Status post cholecystectomy. Nodular hepatic contours are noted consistent with pancreatitis. No focal hepatic abnormality is noted. No biliary dilatation is noted. Pancreas: Unremarkable. No pancreatic ductal dilatation or surrounding inflammatory changes. Spleen: Moderate splenomegaly is noted. Adrenals/Urinary Tract: Adrenal glands are unremarkable. Left renal atrophy is again noted. Right kidney and ureter appear normal. Urinary bladder appears normal. Stomach/Bowel: The stomach appears normal. The appendix appears normal. Wall thickening of the right and transverse colon is noted consistent with infectious or inflammatory colitis. Vascular/Lymphatic: No significant adenopathy is noted. Enlarged collateral veins are noted between the liver and spleen consistent with portal hypertension. Reproductive: Uterus and bilateral adnexa are  unremarkable. Other: No abdominal wall hernia or abnormality. No abdominopelvic ascites. Musculoskeletal: No acute or significant osseous findings. IMPRESSION: Findings consistent with infectious or inflammatory colitis involving the right and transverse colon. Hepatic cirrhosis is noted with moderate splenomegaly and collateral veins present consistent with portal hypertension. Electronically Signed   By: Marijo Conception, M.D.   On: 07/15/2018 11:09      Subjective: feels well today. Mild abdominal pain which is not more than her baseline. No nausea or vomiting. No diarrhea or BM. Ambulating in room. Sister at bedside got updated.    Discharge Exam: Vitals:   07/22/18 2142 07/23/18 0537  BP: (!) 116/46 (!) 133/52  Pulse: 80 93  Resp: 18 18  Temp: 98.9 F (37.2 C) 98.4 F (36.9 C)  SpO2: 97% 98%   Vitals:   07/22/18 0640 07/22/18 1330 07/22/18 2142 07/23/18 0537  BP: (!) 109/50 (!) 112/58 (!) 116/46 (!) 133/52  Pulse: 85 83 80 93  Resp: 18 16 18 18   Temp: 98.7 F (37.1 C) 98.1 F (36.7 C) 98.9 F (37.2 C) 98.4 F (36.9 C)  TempSrc: Oral Oral Oral Oral  SpO2: 99% 99% 97% 98%  Weight:  Height:        Examination: General exam: Conversant, in no acute distress. She looked 48 years old than her real age! Respiratory system: normal chest rise, clear, no audible wheezing Cardiovascular system: regular rhythm, s1-s2 Gastrointestinal system: mild tender but no rebound tenderness or guarding or rigidity, pos BS Central nervous system: No seizures, no tremors Extremities: No cyanosis, no joint deformities Skin: No rashes, no pallor Psychiatry: Affect normal; no auditory hallucinations    The results of significant diagnostics from this hospitalization (including imaging, microbiology, ancillary and laboratory) are listed below for reference.     Microbiology: Recent Results (from the past 240 hour(s))  Gastrointestinal Panel by PCR , Stool     Status: None   Collection  Time: 07/14/18 11:02 AM  Result Value Ref Range Status   Campylobacter species NOT DETECTED NOT DETECTED Final   Plesimonas shigelloides NOT DETECTED NOT DETECTED Final   Salmonella species NOT DETECTED NOT DETECTED Final   Yersinia enterocolitica NOT DETECTED NOT DETECTED Final   Vibrio species NOT DETECTED NOT DETECTED Final   Vibrio cholerae NOT DETECTED NOT DETECTED Final   Enteroaggregative E coli (EAEC) NOT DETECTED NOT DETECTED Final   Enteropathogenic E coli (EPEC) NOT DETECTED NOT DETECTED Final   Enterotoxigenic E coli (ETEC) NOT DETECTED NOT DETECTED Final   Shiga like toxin producing E coli (STEC) NOT DETECTED NOT DETECTED Final   Shigella/Enteroinvasive E coli (EIEC) NOT DETECTED NOT DETECTED Final   Cryptosporidium NOT DETECTED NOT DETECTED Final   Cyclospora cayetanensis NOT DETECTED NOT DETECTED Final   Entamoeba histolytica NOT DETECTED NOT DETECTED Final   Giardia lamblia NOT DETECTED NOT DETECTED Final   Adenovirus F40/41 NOT DETECTED NOT DETECTED Final   Astrovirus NOT DETECTED NOT DETECTED Final   Norovirus GI/GII NOT DETECTED NOT DETECTED Final   Rotavirus A NOT DETECTED NOT DETECTED Final   Sapovirus (I, II, IV, and V) NOT DETECTED NOT DETECTED Final    Comment: Performed at [facility], [address] [place], [place] [address]  C difficile quick scan w PCR reflex     Status: Abnormal   Collection Time: 07/16/18 11:15 AM  Result Value Ref Range Status   C Diff antigen POSITIVE (A) NEGATIVE Final   C Diff toxin NEGATIVE NEGATIVE Final   C Diff interpretation Results are indeterminate. See PCR results.  Final    Comment: Performed at [facility] [place] [address], [place], [place] [address]  C. Diff by PCR, Reflexed     Status: None   Collection Time: 07/16/18 11:15 AM  Result Value Ref Range Status   Toxigenic C. Difficile by PCR NEGATIVE NEGATIVE Final    Comment: Patient is colonized with non toxigenic C. difficile. May not need  treatment unless significant symptoms are present. Performed at [facility], [place] [address], [place], [place] [address]      Labs: BNP (last 3 results) No results for input(s): BNP in the last 8760 hours. Basic Metabolic Panel: Recent Labs  Lab 07/17/18 0510 07/18/18 0523 07/19/18 0526 07/20/18 0549 07/21/18 1605 07/22/18 0520 07/23/18 0519  NA 144 137 140 140 135 139 135  K 2.9* 3.1* 2.9* 3.1* 3.1* 3.3* 3.8  CL 120* 115* 117* 118* 108 111 103  CO2 18* 16* 15* 16* 20* 21* 20*  GLUCOSE 98 94 105* 104* 103* 126* 122*  BUN 6 <5* <5* <5* <5* <5* <5*  CREATININE 0.84 0.76 0.74 0.72 0.72 0.70 0.61  CALCIUM 9.0  8.5* 8.8* 9.1 8.3* 8.8* 9.2  MG 1.3* 1.8 1.9 1.7  --   --  1.4*  PHOS 2.7 1.8*  --  3.9  --   --   --    Liver Function Tests: Recent Labs  Lab 07/17/18 0510 07/18/18 0523 07/19/18 0526 07/20/18 0549  AST 62* 43* 39 38  ALT 31 24 27 25   ALKPHOS 106 89 101 106  BILITOT 4.2* 4.3* 4.7* 5.3*  PROT 6.2* 6.2* 7.1 7.0  ALBUMIN 2.9* 2.8* 3.2* 3.4*   No results for input(s): LIPASE, AMYLASE in the last 168 hours. No results for input(s): AMMONIA in the last 168 hours. CBC: Recent Labs  Lab 07/19/18 0526 07/20/18 0549 07/21/18 1605 07/22/18 0520 07/23/18 0519  WBC 5.2 3.4* 3.2* 3.3* 2.6*  HGB 8.9* 8.8* 7.7* 7.7* 8.5*  HCT 25.4* 25.4* 22.0* 22.3* 25.5*  MCV 100.0 100.4* 100.0 101.8* 104.9*  PLT 50* 46* 40* 39* 42*   Cardiac Enzymes: No results for input(s): CKTOTAL, CKMB, CKMBINDEX, TROPONINI in the last 168 hours. BNP: Invalid input(s): POCBNP CBG: No results for input(s): GLUCAP in the last 168 hours. D-Dimer No results for input(s): DDIMER in the last 72 hours. Hgb A1c No results for input(s): HGBA1C in the last 72 hours. Lipid Profile No results for input(s): CHOL, HDL, LDLCALC, TRIG, CHOLHDL, LDLDIRECT in the last 72 hours. Thyroid function studies No results for input(s): TSH, T4TOTAL, T3FREE, THYROIDAB in the last 72 hours.  Invalid  input(s): FREET3 Anemia work up Recent Labs    07/20/18 1122  VITAMINB12 1,453*  FOLATE 6.0   Urinalysis    Component Value Date/Time   COLORURINE YELLOW 07/13/2018 1907   APPEARANCEUR CLEAR 07/13/2018 1907   APPEARANCEUR Clear 02/04/2018 1118   LABSPEC <1.005 (L) 07/13/2018 1907   PHURINE 6.0 07/13/2018 1907   GLUCOSEU NEGATIVE 07/13/2018 1907   HGBUR NEGATIVE 07/13/2018 1907   BILIRUBINUR NEGATIVE 07/13/2018 1907   BILIRUBINUR Negative 02/04/2018 1118   KETONESUR NEGATIVE 07/13/2018 1907   PROTEINUR NEGATIVE 07/13/2018 1907   UROBILINOGEN 0.2 05/19/2016 1638   UROBILINOGEN 0.2 08/17/2015 1853   NITRITE NEGATIVE 07/13/2018 1907   LEUKOCYTESUR NEGATIVE 07/13/2018 1907   LEUKOCYTESUR Negative 02/04/2018 1118   Sepsis Labs Invalid input(s): PROCALCITONIN,  WBC,  LACTICIDVEN Microbiology Recent Results (from the past 240 hour(s))  Gastrointestinal Panel by PCR , Stool     Status: None   Collection Time: 07/14/18 11:02 AM  Result Value Ref Range Status   Campylobacter species NOT DETECTED NOT DETECTED Final   Plesimonas shigelloides NOT DETECTED NOT DETECTED Final   Salmonella species NOT DETECTED NOT DETECTED Final   Yersinia enterocolitica NOT DETECTED NOT DETECTED Final   Vibrio species NOT DETECTED NOT DETECTED Final   Vibrio cholerae NOT DETECTED NOT DETECTED Final   Enteroaggregative E coli (EAEC) NOT DETECTED NOT DETECTED Final   Enteropathogenic E coli (EPEC) NOT DETECTED NOT DETECTED Final   Enterotoxigenic E coli (ETEC) NOT DETECTED NOT DETECTED Final   Shiga like toxin producing E coli (STEC) NOT DETECTED NOT DETECTED Final   Shigella/Enteroinvasive E coli (EIEC) NOT DETECTED NOT DETECTED Final   Cryptosporidium NOT DETECTED NOT DETECTED Final   Cyclospora cayetanensis NOT DETECTED NOT DETECTED Final   Entamoeba histolytica NOT DETECTED NOT DETECTED Final   Giardia lamblia NOT DETECTED NOT DETECTED Final   Adenovirus F40/41 NOT DETECTED NOT DETECTED Final    Astrovirus NOT DETECTED NOT DETECTED Final   Norovirus GI/GII NOT DETECTED NOT DETECTED Final   Rotavirus A  NOT DETECTED NOT DETECTED Final   Sapovirus (I, II, IV, and V) NOT DETECTED NOT DETECTED Final    Comment: Performed at [facility], [address] [place], [place] [address]  C difficile quick scan w PCR reflex     Status: Abnormal   Collection Time: 07/16/18 11:15 AM  Result Value Ref Range Status   C Diff antigen POSITIVE (A) NEGATIVE Final   C Diff toxin NEGATIVE NEGATIVE Final   C Diff interpretation Results are indeterminate. See PCR results.  Final    Comment: Performed at [facility], [place] [address], [place], [place] [address]  C. Diff by PCR, Reflexed     Status: None   Collection Time: 07/16/18 11:15 AM  Result Value Ref Range Status   Toxigenic C. Difficile by PCR NEGATIVE NEGATIVE Final    Comment: Patient is colonized with non toxigenic C. difficile. May not need treatment unless significant symptoms are present. Performed at [facility], [place] [address] [place], [place] [address]      Time coordinating discharge: 38 minutes  SIGNED:   Paticia Stack, MD  [facility] 07/23/2018, 9:48 AM Pager   If 7PM-7AM, please contact night-coverage [email or web address] Password [email or web address]

## 2018-07-26 ENCOUNTER — Telehealth: Payer: Self-pay | Admitting: Internal Medicine

## 2018-07-26 ENCOUNTER — Other Ambulatory Visit: Payer: Self-pay

## 2018-07-26 MED ORDER — ONDANSETRON 4 MG PO TBDP: 4.0000 mg | ORAL_TABLET | Freq: Three times a day (TID) | ORAL | 0 refills | Status: DC | PRN

## 2018-07-26 MED ORDER — PREGABALIN 50 MG PO CAPS: 50.0000 mg | ORAL_CAPSULE | Freq: Three times a day (TID) | ORAL | 1 refills | Status: DC

## 2018-07-26 NOTE — Telephone Encounter (Signed)
CMA spoke to patient's friend and inform Lyrica is ready for pick up.

## 2018-07-26 NOTE — Telephone Encounter (Signed)
Pt's friend came in to request a printed script for the following medication, due to a recent change in insurance coverage -pregabalin (LYRICA) 50 MG capsule  Please call pt when ready for pickup

## 2018-07-27 ENCOUNTER — Encounter: Payer: Self-pay | Admitting: Critical Care Medicine

## 2018-07-27 ENCOUNTER — Ambulatory Visit: Payer: Medicaid Other | Attending: Critical Care Medicine | Admitting: Critical Care Medicine

## 2018-07-27 ENCOUNTER — Telehealth: Payer: Self-pay

## 2018-07-27 VITALS — BP 112/76 | HR 84 | Temp 98.7°F | Resp 14 | Ht 61.0 in | Wt 112.4 lb

## 2018-07-27 DIAGNOSIS — Z79899 Other long term (current) drug therapy: Secondary | ICD-10-CM | POA: Insufficient documentation

## 2018-07-27 DIAGNOSIS — D6959 Other secondary thrombocytopenia: Secondary | ICD-10-CM | POA: Insufficient documentation

## 2018-07-27 DIAGNOSIS — K766 Portal hypertension: Secondary | ICD-10-CM | POA: Insufficient documentation

## 2018-07-27 DIAGNOSIS — K7030 Alcoholic cirrhosis of liver without ascites: Secondary | ICD-10-CM | POA: Diagnosis present

## 2018-07-27 DIAGNOSIS — K3189 Other diseases of stomach and duodenum: Secondary | ICD-10-CM | POA: Diagnosis not present

## 2018-07-27 DIAGNOSIS — F329 Major depressive disorder, single episode, unspecified: Secondary | ICD-10-CM | POA: Insufficient documentation

## 2018-07-27 DIAGNOSIS — G8929 Other chronic pain: Secondary | ICD-10-CM | POA: Diagnosis not present

## 2018-07-27 DIAGNOSIS — E876 Hypokalemia: Secondary | ICD-10-CM | POA: Diagnosis not present

## 2018-07-27 DIAGNOSIS — E039 Hypothyroidism, unspecified: Secondary | ICD-10-CM | POA: Insufficient documentation

## 2018-07-27 DIAGNOSIS — K861 Other chronic pancreatitis: Secondary | ICD-10-CM | POA: Diagnosis not present

## 2018-07-27 DIAGNOSIS — D638 Anemia in other chronic diseases classified elsewhere: Secondary | ICD-10-CM

## 2018-07-27 DIAGNOSIS — D61818 Other pancytopenia: Secondary | ICD-10-CM | POA: Diagnosis not present

## 2018-07-27 DIAGNOSIS — K529 Noninfective gastroenteritis and colitis, unspecified: Secondary | ICD-10-CM

## 2018-07-27 DIAGNOSIS — F419 Anxiety disorder, unspecified: Secondary | ICD-10-CM | POA: Diagnosis not present

## 2018-07-27 DIAGNOSIS — Z8619 Personal history of other infectious and parasitic diseases: Secondary | ICD-10-CM

## 2018-07-27 DIAGNOSIS — G5793 Unspecified mononeuropathy of bilateral lower limbs: Secondary | ICD-10-CM

## 2018-07-27 DIAGNOSIS — R1013 Epigastric pain: Secondary | ICD-10-CM

## 2018-07-27 DIAGNOSIS — R112 Nausea with vomiting, unspecified: Secondary | ICD-10-CM

## 2018-07-27 DIAGNOSIS — R162 Hepatomegaly with splenomegaly, not elsewhere classified: Secondary | ICD-10-CM

## 2018-07-27 DIAGNOSIS — Z09 Encounter for follow-up examination after completed treatment for conditions other than malignant neoplasm: Secondary | ICD-10-CM

## 2018-07-27 DIAGNOSIS — A0471 Enterocolitis due to Clostridium difficile, recurrent: Secondary | ICD-10-CM

## 2018-07-27 DIAGNOSIS — K8689 Other specified diseases of pancreas: Secondary | ICD-10-CM

## 2018-07-27 DIAGNOSIS — E034 Atrophy of thyroid (acquired): Secondary | ICD-10-CM

## 2018-07-27 DIAGNOSIS — D696 Thrombocytopenia, unspecified: Secondary | ICD-10-CM

## 2018-07-27 DIAGNOSIS — R197 Diarrhea, unspecified: Secondary | ICD-10-CM

## 2018-07-27 DIAGNOSIS — I864 Gastric varices: Secondary | ICD-10-CM | POA: Diagnosis not present

## 2018-07-27 DIAGNOSIS — F32A Depression, unspecified: Secondary | ICD-10-CM

## 2018-07-27 MED ORDER — GABAPENTIN 300 MG PO CAPS: 300.0000 mg | ORAL_CAPSULE | Freq: Three times a day (TID) | ORAL | 3 refills | Status: AC

## 2018-07-27 MED ORDER — SERTRALINE HCL 50 MG PO TABS: 50.0000 mg | ORAL_TABLET | Freq: Every day | ORAL | 6 refills | Status: DC

## 2018-07-27 MED ORDER — PANCRELIPASE (LIP-PROT-AMYL) 36000-114000 UNITS PO CPEP: 72000.0000 [IU] | ORAL_CAPSULE | Freq: Three times a day (TID) | ORAL | 6 refills | Status: AC

## 2018-07-27 MED ORDER — MAGNESIUM OXIDE -MG SUPPLEMENT 500 MG PO TABS: 500.0000 mg | ORAL_TABLET | Freq: Every day | ORAL | 6 refills | Status: AC

## 2018-07-27 MED ORDER — ESOMEPRAZOLE MAGNESIUM 20 MG PO CPDR: 20.0000 mg | DELAYED_RELEASE_CAPSULE | Freq: Every day | ORAL | 6 refills | Status: DC

## 2018-07-27 MED ORDER — VANCOMYCIN HCL 250 MG PO CAPS: 250.0000 mg | ORAL_CAPSULE | Freq: Four times a day (QID) | ORAL | 0 refills | Status: DC

## 2018-07-27 MED ORDER — HYDROCODONE-ACETAMINOPHEN 7.5-325 MG PO TABS: 1.0000 | ORAL_TABLET | Freq: Four times a day (QID) | ORAL | 0 refills | Status: DC | PRN

## 2018-07-27 MED ORDER — ONDANSETRON HCL 4 MG PO TABS: 4.0000 mg | ORAL_TABLET | Freq: Four times a day (QID) | ORAL | 6 refills | Status: DC | PRN

## 2018-07-27 MED ORDER — SUCRALFATE 1 GM/10ML PO SUSP: 1.0000 g | Freq: Three times a day (TID) | ORAL | 6 refills | Status: DC

## 2018-07-27 MED ORDER — FAMOTIDINE 20 MG PO TABS: 20.0000 mg | ORAL_TABLET | Freq: Every day | ORAL | 6 refills | Status: DC

## 2018-07-27 NOTE — Assessment & Plan Note (Signed)
Cirrhotic alcohol liver disease

## 2018-07-27 NOTE — Assessment & Plan Note (Signed)
Chronic abdominal pain reference above assessments

## 2018-07-27 NOTE — Assessment & Plan Note (Addendum)
Recurrent C. difficile diarrhea We will administer an additional 7 days of vancomycin orally 250 mg 4 times daily

## 2018-07-27 NOTE — Patient Instructions (Signed)
Refills on all medications were given Take vancomycin capsule 1 4 times daily till gone Norco pain medicine was prescribed every 6 hours as needed Discontinue tramadol Begin gabapentin 300 mg 3 times daily All medications were sent to the [facility]  Keep your appointment with gastroenterology and we will attempt to see if we can get this moved up to a sooner appointment  Follow-up with your primary care physician will be arranged We need to obtain laboratory data today to include a CBC and chemistry panel

## 2018-07-27 NOTE — Assessment & Plan Note (Signed)
Portal hypertension with gastric varices stable at this time

## 2018-07-27 NOTE — Progress Notes (Signed)
Pt states she was discharged from hospital on Friday and the next day she had diarrhea and vomiting. Pt states the Tramadol does next to nothing; it relaxes her a little but does not touch the pain.  Pt accompanied by gentleman who acts as Engineer, building services.  He said pt is not at all a drug seeker from his observation, she has tremendous pain and wanted to know if she needs to go to a pain clinic.

## 2018-07-27 NOTE — Assessment & Plan Note (Addendum)
Chronic cirrhotic liver disease secondary to alcohol in the past with associated portal hypertension now with splenomegaly and chronic thrombocytopenia. Also chronic pancreatitis with resultant pancreatic insufficiency. Recurrence of colitis likely C. difficile in nature though testing for this was negative in the past hospitalization.  The patient was treated empirically with vancomycin however she did not receive a post hospital dosing. Plan Earlier referral back to gastroenterology for follow-up Continue Creon at current dose Pain management of chronic abdominal pain Continue nutritional support  Note I visited [place] controlled substance data base.   No narcotic Rx have been sent except for those via this clinic the past 4 months

## 2018-07-27 NOTE — Telephone Encounter (Signed)
Dr Joya Gaskins requested appointment on 08/13/18 be changed to earlier appointment. Tye Savoy PA on 07/29/18 At 10:00am made. Luretha Rued, who had just come to appt with Dr Joya Gaskins called, and new Maryanna Shape appt given to him and he said he will have her there on the 10th at 10:00am to see [facility]

## 2018-07-27 NOTE — Assessment & Plan Note (Signed)
Pancytopenia We will follow-up CBC

## 2018-07-27 NOTE — Assessment & Plan Note (Signed)
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

## 2018-07-27 NOTE — Assessment & Plan Note (Signed)
Pancreatic insufficiency Continue Creon at current dose

## 2018-07-27 NOTE — Progress Notes (Signed)
Subjective:    Patient ID: Jamie Allen, female    DOB: [birth year], 47 y.o.   MRN: [id number]  46 y.o.femalewith medical history significant ofsignificant ofliver cirrhosis, alcoholism, previous C. difficile colitis, hypothyroidism,pancytopenia,anxiety,and chronic abdominal pain;who presents with complaints of worsening abdominal pain with nausea, vomiting, and diarrhea for 4 days. Patient had just been hospitalized from 9/14-9/15with similar symptoms thought to be related to pancreatitis. However, patient left against Medical advice after she started feeling better after initial therapies. C. difficile studies at that time were noted to be negative.  For this admission, she's afebrile,stool study is negative for C diff toxin and GI pathology panel is negative too. CT abd pelvis showed findings c/w infectious or inflammatory colitis involving the right and transverse colon. GI consulted. Planned for empiric po vancomycin for total 10 days. After 8 days of hospital stay with po vancomycin, Creo supplement with every meal, and other supportive care, eventually her diarrhea has stopped and upon discharge she has not had BM for 24 hours. Gi strongly suspected her having abdominal pain/diarrhea/vomiting mainly from pancreatic insufficiency, and has doubled her home Creon dose.   Hb at one point was down to 6.9, s/p 1 unit PRBC, Hb up to 8.8then 8.5 upon discharge. Otherwisemild neutropeniaand thrombocytopeniastable.   She's on electrolyte replacement for hypokalemia and hypomagnesia. She's prescribed with mag-ox upon discharge.  Patient is heavily narcotics dependent and exhibit drug seeking behavior in hospital and was almost on round the clock iv dilaudid. She's back on PRN Norco and tramadol upon discharge.  07/27/2018 This patient is seen today in post hospital follow-up.  The patient was hospitalized in September 24 and October 4 for acute abdominal pain diarrhea nausea  and vomiting with electrolyte disturbance and anemia.  Patient has underlying liver cirrhosis and was found to have transverse and descending colon inflammatory changes despite the fact that the patient had a negative C. difficile involvement.  Gastroenterology saw the patient during this evaluation and recommended empiric vancomycin for treatment course of 10 days and then continue this beyond the hospital stay for an additional 8 days.  The patient's diarrhea did resolve towards the end of the hospitalization.  While no acute pancreatitis seen on the CT scan it was of the opinion that the patient had pancreatic insufficiency in this extremity to the abdominal pain diarrhea and vomiting.  She had an increased dose of Creon which appeared to improve her symptomatology.  She also received a red blood cell transfusion during the hospital stay.  On return today the patient's abdominal pain continues and there is still noted to be diarrhea and episodic emesis.  The patient apparently did not receive oral vancomycin on discharge as recommended by gastroenterology.  She has a listing of itching as a reaction to vancomycin but she states that this is not severe.  Patient has significant pain in the midepigastric area.  She has not been able to keep much nutrition down.  She also struggling with getting her chronic medications refilled.  She now has Medicaid and would prefer to go to a private pharmacy.     Review of Systems  Constitutional: Positive for appetite change, fatigue and unexpected weight change.  HENT: Negative.   Eyes: Positive for visual disturbance.  Respiratory: Negative.   Cardiovascular: Negative.   Gastrointestinal: Positive for abdominal pain, diarrhea, nausea and vomiting.  Endocrine: Negative.   Genitourinary: Negative.   Musculoskeletal: Negative.   Skin: Negative.   Allergic/Immunologic: Negative.   Neurological: Positive for  dizziness, weakness, light-headedness and numbness.  Negative for syncope.  Hematological: Bruises/bleeds easily.       Objective:   Physical Exam Vitals:   07/27/18 1424  BP: 112/76  Pulse: 84  Resp: 14  Temp: 98.7 F (37.1 C)  TempSrc: Core  SpO2: 95%  Weight: 112 lb 6.4 oz (51 kg)  Height: 5' 1"  (1.549 m)    Gen: Pleasant, thin and cachectic in no distress,  normal affect  ENT: No lesions,  mouth clear,  oropharynx clear, no postnasal drip  Neck: No JVD, no TMG, no carotid bruits  Lungs: No use of accessory muscles, no dullness to percussion, clear without rales or rhonchi  Cardiovascular: RRR, heart sounds normal, no murmur or gallops, no peripheral edema  Abdomen: Tender in midepigastric area, hepatosplenomegaly is noted,  BS normal  Musculoskeletal: No deformities, no cyanosis or clubbing  Neuro: alert, non focal  Skin: Warm, no lesions or rashes, bruising is seen    CT ABD 9/26: IMPRESSION: Findings consistent with infectious or inflammatory colitis involving the right and transverse colon.  Hepatic cirrhosis is noted with moderate splenomegaly and collateral veins present consistent with portal hypertension.  CMP Latest Ref Rng & Units 07/23/2018 07/22/2018 07/21/2018  Glucose 70 - 99 mg/dL 122(H) 126(H) 103(H)  BUN 6 - 20 mg/dL <5(L) <5(L) <5(L)  Creatinine 0.44 - 1.00 mg/dL 0.61 0.70 0.72  Sodium 135 - 145 mmol/L 135 139 135  Potassium 3.5 - 5.1 mmol/L 3.8 3.3(L) 3.1(L)  Chloride 98 - 111 mmol/L 103 111 108  CO2 22 - 32 mmol/L 20(L) 21(L) 20(L)  Calcium 8.9 - 10.3 mg/dL 9.2 8.8(L) 8.3(L)  Total Protein 6.5 - 8.1 g/dL - - -  Total Bilirubin 0.3 - 1.2 mg/dL - - -  Alkaline Phos 38 - 126 U/L - - -  AST 15 - 41 U/L - - -  ALT 0 - 44 U/L - - -   Lab Results  Component Value Date   WBC 2.6 (L) 07/23/2018   HGB 8.5 (L) 07/23/2018   HCT 25.5 (L) 07/23/2018   MCV 104.9 (H) 07/23/2018   PLT 42 (L) 07/23/2018    Echo 12/2017 Study Conclusions  - Left ventricle: The cavity size was normal. Wall  thickness was   normal. Systolic function was normal. The estimated ejection   fraction was in the range of 60% to 65%. - Mitral valve: There was mild regurgitation. - Left atrium: The atrium was mildly dilated. - Tricuspid valve: There was moderate regurgitation. - Pulmonary arteries: PA peak pressure: 56 mm Hg (S).      Assessment & Plan:  I personally reviewed all images and lab data in the [facility] system as well as any outside material available during this office visit and agree with the  radiology impressions.   Cirrhosis ([place]) Chronic cirrhotic liver disease secondary to alcohol in the past with associated portal hypertension now with splenomegaly and chronic thrombocytopenia. Also chronic pancreatitis with resultant pancreatic insufficiency. Recurrence of colitis likely C. difficile in nature though testing for this was negative in the past hospitalization.  The patient was treated empirically with vancomycin however she did not receive a post hospital dosing. 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  Diagnoses and all orders for this visit:  Colitis -     CBC with Differential/Platelet; Future -     CBC with Differential/Platelet  Anxiety and depression -     sertraline (ZOLOFT) 50 MG tablet; Take 1 tablet (50 mg total) by mouth daily.  Anemia of chronic disease -     lipase/protease/amylase (CREON) 36000 UNITS CPEP capsule; Take 2 capsules (72,000 Units total) by mouth 3 (three) times daily before meals.  Nausea vomiting and diarrhea -     Comprehensive metabolic panel -     CBC with Differential/Platelet; Future -     CBC with Differential/Platelet  Hypokalemia -     Comprehensive metabolic panel  Alcoholic cirrhosis of liver without ascites (HCC)  Hepatosplenomegaly  Pancytopenia (HCC)  History of Clostridium difficile colitis  Neuropathic pain, leg, bilateral  Hospital discharge follow-up  Hypothyroidism due to acquired atrophy of thyroid  Portal hypertensive gastropathy (HCC)  Pancreatic insufficiency  Recurrent Clostridium difficile diarrhea  Epigastric pain  Hypomagnesemia  Thrombocytopenia (HCC)  Other orders -     sucralfate (CARAFATE) 1 GM/10ML suspension; Take 10 mLs (1 g total) by mouth 4 (four) times daily -  with meals and at bedtime. -     ondansetron (ZOFRAN) 4 MG tablet; Take 1 tablet (4 mg total) by mouth every 6 (six) hours as needed for nausea. -     Magnesium Oxide 500 MG TABS; Take 1 tablet (500 mg total) by mouth daily. -     famotidine (PEPCID) 20 MG tablet; Take 1 tablet (20 mg total) by mouth daily. -     esomeprazole (NEXIUM) 20 MG capsule; Take 1 capsule (20 mg total) by mouth daily. -     gabapentin (NEURONTIN) 300 MG capsule; Take 1 capsule (300 mg total) by mouth 3 (three) times daily. -     HYDROcodone-acetaminophen (NORCO) 7.5-325 MG tablet; Take 1 tablet by mouth every 6 (six) hours as needed for moderate pain. -     vancomycin  (VANCOCIN) 250 MG capsule; Take 1 capsule (250 mg total) by mouth 4 (four) times daily.

## 2018-07-27 NOTE — Assessment & Plan Note (Signed)
History of hypothyroidism however the patient is off Synthroid at this time Continued observation

## 2018-07-27 NOTE — Assessment & Plan Note (Signed)
Chronic hypomagnesemia Continue replacement therapy

## 2018-07-29 ENCOUNTER — Ambulatory Visit (INDEPENDENT_AMBULATORY_CARE_PROVIDER_SITE_OTHER): Payer: Medicaid Other | Admitting: Nurse Practitioner

## 2018-07-29 ENCOUNTER — Encounter: Payer: Self-pay | Admitting: Critical Care Medicine

## 2018-07-29 ENCOUNTER — Encounter: Payer: Self-pay | Admitting: Nurse Practitioner

## 2018-07-29 VITALS — BP 100/56 | HR 87 | Ht 61.0 in | Wt 112.0 lb

## 2018-07-29 DIAGNOSIS — R197 Diarrhea, unspecified: Secondary | ICD-10-CM

## 2018-07-29 DIAGNOSIS — K7460 Unspecified cirrhosis of liver: Secondary | ICD-10-CM

## 2018-07-29 DIAGNOSIS — D539 Nutritional anemia, unspecified: Secondary | ICD-10-CM | POA: Diagnosis not present

## 2018-07-29 LAB — COMPREHENSIVE METABOLIC PANEL
ALT: 28 IU/L (ref 0–32)
AST: 79 IU/L — AB (ref 0–40)
Albumin/Globulin Ratio: 1 — ABNORMAL LOW (ref 1.2–2.2)
Albumin: 3.7 g/dL (ref 3.5–5.5)
Alkaline Phosphatase: 165 IU/L — ABNORMAL HIGH (ref 39–117)
BUN / CREAT RATIO: 5 — AB (ref 9–23)
BUN: 4 mg/dL — AB (ref 6–24)
Bilirubin Total: 6.1 mg/dL — ABNORMAL HIGH (ref 0.0–1.2)
CALCIUM: 9.2 mg/dL (ref 8.7–10.2)
CO2: 19 mmol/L — AB (ref 20–29)
CREATININE: 0.8 mg/dL (ref 0.57–1.00)
Chloride: 99 mmol/L (ref 96–106)
GFR calc Af Amer: 102 mL/min/{1.73_m2} (ref >59)
GFR, EST NON AFRICAN AMERICAN: 88 mL/min/{1.73_m2} (ref >59)
GLUCOSE: 95 mg/dL (ref 65–99)
Globulin, Total: 3.6 g/dL (ref 1.5–4.5)
Potassium: 3.4 mmol/L — ABNORMAL LOW (ref 3.5–5.2)
SODIUM: 135 mmol/L (ref 134–144)
Total Protein: 7.3 g/dL (ref 6.0–8.5)

## 2018-07-29 LAB — CBC WITH DIFFERENTIAL/PLATELET
BASOS: 1 %
Basophils Absolute: 0 10*3/uL (ref 0.0–0.2)
EOS (ABSOLUTE): 0.1 10*3/uL (ref 0.0–0.4)
Eos: 3 %
HEMOGLOBIN: 8.6 g/dL — AB (ref 11.1–15.9)
Hematocrit: 23.3 % — ABNORMAL LOW (ref 34.0–46.6)
IMMATURE GRANS (ABS): 0 10*3/uL (ref 0.0–0.1)
IMMATURE GRANULOCYTES: 1 %
Lymphocytes Absolute: 0.7 10*3/uL (ref 0.7–3.1)
Lymphs: 14 %
MCH: 35.2 pg — ABNORMAL HIGH (ref 26.6–33.0)
MCHC: 36.9 g/dL — ABNORMAL HIGH (ref 31.5–35.7)
MCV: 96 fL (ref 79–97)
MONOCYTES: 6 %
Monocytes Absolute: 0.3 10*3/uL (ref 0.1–0.9)
NEUTROS PCT: 75 %
Neutrophils Absolute: 3.6 10*3/uL (ref 1.4–7.0)
PLATELETS: 69 10*3/uL — AB (ref 150–450)
RBC: 2.44 x10E6/uL — CL (ref 3.77–5.28)
RDW: 16.8 % — ABNORMAL HIGH (ref 12.3–15.4)
WBC: 4.7 10*3/uL (ref 3.4–10.8)

## 2018-07-29 MED ORDER — DIPHENOXYLATE-ATROPINE 2.5-0.025 MG PO TABS: 1.0000 | ORAL_TABLET | Freq: Two times a day (BID) | ORAL | 0 refills | Status: DC | PRN
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
## 2018-07-29 NOTE — Patient Instructions (Signed)
If you are age 48 or older, your body mass index should be between 23-30. Your Body mass index is 21.16 kg/m. If this is out of the aforementioned range listed, please consider follow up with your Primary Care Provider.  If you are age 43 or younger, your body mass index should be between 19-25. Your Body mass index is 21.16 kg/m. If this is out of the aformentioned range listed, please consider follow up with your Primary Care Provider.   We have sent the following medications to your pharmacy for you to pick up at your convenience: Lomotil  Resume Nexium  STOP PEPCID.  STOP VANCOMYCIN.  Follow up with Dr. Havery Moros on 09/06/18 at 11 am.  Thank you for choosing me and [facility].   Tye Savoy, NP

## 2018-07-30 NOTE — Progress Notes (Signed)
Agree with assessment and plan as outlined.  

## 2018-08-04 ENCOUNTER — Ambulatory Visit: Payer: Medicaid Other | Attending: Internal Medicine | Admitting: Physician Assistant

## 2018-08-04 VITALS — BP 100/65 | HR 82 | Temp 98.1°F | Ht 61.0 in | Wt 117.6 lb

## 2018-08-04 DIAGNOSIS — E039 Hypothyroidism, unspecified: Secondary | ICD-10-CM | POA: Diagnosis not present

## 2018-08-04 DIAGNOSIS — F419 Anxiety disorder, unspecified: Secondary | ICD-10-CM | POA: Insufficient documentation

## 2018-08-04 DIAGNOSIS — Z881 Allergy status to other antibiotic agents status: Secondary | ICD-10-CM | POA: Insufficient documentation

## 2018-08-04 DIAGNOSIS — Z9049 Acquired absence of other specified parts of digestive tract: Secondary | ICD-10-CM | POA: Insufficient documentation

## 2018-08-04 DIAGNOSIS — K860 Alcohol-induced chronic pancreatitis: Secondary | ICD-10-CM | POA: Insufficient documentation

## 2018-08-04 DIAGNOSIS — R109 Unspecified abdominal pain: Secondary | ICD-10-CM | POA: Diagnosis not present

## 2018-08-04 DIAGNOSIS — G5793 Unspecified mononeuropathy of bilateral lower limbs: Secondary | ICD-10-CM

## 2018-08-04 DIAGNOSIS — Z79899 Other long term (current) drug therapy: Secondary | ICD-10-CM | POA: Insufficient documentation

## 2018-08-04 DIAGNOSIS — G629 Polyneuropathy, unspecified: Secondary | ICD-10-CM | POA: Insufficient documentation

## 2018-08-04 DIAGNOSIS — Z885 Allergy status to narcotic agent status: Secondary | ICD-10-CM | POA: Insufficient documentation

## 2018-08-04 DIAGNOSIS — K7581 Nonalcoholic steatohepatitis (NASH): Secondary | ICD-10-CM | POA: Insufficient documentation

## 2018-08-04 DIAGNOSIS — K219 Gastro-esophageal reflux disease without esophagitis: Secondary | ICD-10-CM | POA: Insufficient documentation

## 2018-08-04 DIAGNOSIS — M2550 Pain in unspecified joint: Secondary | ICD-10-CM | POA: Insufficient documentation

## 2018-08-04 DIAGNOSIS — K7030 Alcoholic cirrhosis of liver without ascites: Secondary | ICD-10-CM | POA: Diagnosis not present

## 2018-08-04 DIAGNOSIS — I10 Essential (primary) hypertension: Secondary | ICD-10-CM | POA: Insufficient documentation

## 2018-08-04 DIAGNOSIS — G8929 Other chronic pain: Secondary | ICD-10-CM | POA: Insufficient documentation
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
## 2018-08-05 ENCOUNTER — Inpatient Hospital Stay: Payer: Medicaid Other

## 2018-08-05 ENCOUNTER — Other Ambulatory Visit: Payer: Self-pay

## 2018-08-05 ENCOUNTER — Encounter: Payer: Self-pay | Admitting: Family

## 2018-08-05 ENCOUNTER — Ambulatory Visit: Payer: Self-pay | Admitting: Internal Medicine

## 2018-08-05 ENCOUNTER — Inpatient Hospital Stay: Payer: Medicaid Other | Attending: Hematology & Oncology | Admitting: Family

## 2018-08-05 VITALS — BP 109/44 | HR 78 | Temp 98.4°F | Resp 14 | Wt 119.0 lb

## 2018-08-05 DIAGNOSIS — D696 Thrombocytopenia, unspecified: Secondary | ICD-10-CM

## 2018-08-05 DIAGNOSIS — G629 Polyneuropathy, unspecified: Secondary | ICD-10-CM | POA: Insufficient documentation

## 2018-08-05 DIAGNOSIS — K7460 Unspecified cirrhosis of liver: Secondary | ICD-10-CM

## 2018-08-05 DIAGNOSIS — R7989 Other specified abnormal findings of blood chemistry: Secondary | ICD-10-CM

## 2018-08-05 DIAGNOSIS — D61818 Other pancytopenia: Secondary | ICD-10-CM

## 2018-08-05 DIAGNOSIS — E60 Dietary zinc deficiency: Secondary | ICD-10-CM

## 2018-08-05 DIAGNOSIS — D638 Anemia in other chronic diseases classified elsewhere: Secondary | ICD-10-CM

## 2018-08-05 DIAGNOSIS — R161 Splenomegaly, not elsewhere classified: Secondary | ICD-10-CM | POA: Diagnosis not present

## 2018-08-05 DIAGNOSIS — D510 Vitamin B12 deficiency anemia due to intrinsic factor deficiency: Secondary | ICD-10-CM

## 2018-08-05 DIAGNOSIS — D649 Anemia, unspecified: Secondary | ICD-10-CM | POA: Insufficient documentation

## 2018-08-05 DIAGNOSIS — E610 Copper deficiency: Secondary | ICD-10-CM

## 2018-08-05 DIAGNOSIS — Z79899 Other long term (current) drug therapy: Secondary | ICD-10-CM | POA: Diagnosis not present

## 2018-08-05 DIAGNOSIS — Z720 Tobacco use: Secondary | ICD-10-CM | POA: Diagnosis not present

## 2018-08-05 DIAGNOSIS — D539 Nutritional anemia, unspecified: Secondary | ICD-10-CM

## 2018-08-05 DIAGNOSIS — Z9181 History of falling: Secondary | ICD-10-CM

## 2018-08-05 LAB — CBC WITH DIFFERENTIAL (CANCER CENTER ONLY)
ABS IMMATURE GRANULOCYTES: 0.03 10*3/uL (ref 0.00–0.07)
BASOS PCT: 1 %
Basophils Absolute: 0 10*3/uL (ref 0.0–0.1)
EOS ABS: 0.1 10*3/uL (ref 0.0–0.5)
Eosinophils Relative: 3 %
HCT: 23.2 % — ABNORMAL LOW (ref 36.0–46.0)
Hemoglobin: 7.5 g/dL — ABNORMAL LOW (ref 12.0–15.0)
Immature Granulocytes: 1 %
Lymphocytes Relative: 15 %
Lymphs Abs: 0.5 10*3/uL — ABNORMAL LOW (ref 0.7–4.0)
MCH: 35.5 pg — AB (ref 26.0–34.0)
MCHC: 32.3 g/dL (ref 30.0–36.0)
MCV: 110 fL — AB (ref 80.0–100.0)
MONO ABS: 0.3 10*3/uL (ref 0.1–1.0)
MONOS PCT: 8 %
NEUTROS ABS: 2.5 10*3/uL (ref 1.7–7.7)
Neutrophils Relative %: 72 %
PLATELETS: 63 10*3/uL — AB (ref 150–400)
RBC: 2.11 MIL/uL — ABNORMAL LOW (ref 3.87–5.11)
RDW: 17.9 % — ABNORMAL HIGH (ref 11.5–15.5)
WBC Count: 3.5 10*3/uL — ABNORMAL LOW (ref 4.0–10.5)
nRBC: 0 % (ref 0.0–0.2)

## 2018-08-05 LAB — CMP (CANCER CENTER ONLY)
ALT: 28 U/L (ref 10–47)
AST: 64 U/L — ABNORMAL HIGH (ref 11–38)
Albumin: 2.9 g/dL — ABNORMAL LOW (ref 3.5–5.0)
Alkaline Phosphatase: 170 U/L — ABNORMAL HIGH (ref 26–84)
Anion gap: 4 — ABNORMAL LOW (ref 5–15)
BUN: 9 mg/dL (ref 7–22)
CO2: 28 mmol/L (ref 18–33)
Calcium: 8.9 mg/dL (ref 8.0–10.3)
Chloride: 106 mmol/L (ref 98–108)
Creatinine: 0.9 mg/dL (ref 0.60–1.20)
Glucose, Bld: 104 mg/dL (ref 73–118)
Potassium: 3.7 mmol/L (ref 3.3–4.7)
Sodium: 138 mmol/L (ref 128–145)
Total Bilirubin: 3.2 mg/dL — ABNORMAL HIGH (ref 0.2–1.6)
Total Protein: 6.9 g/dL (ref 6.4–8.1)

## 2018-08-05 LAB — RETICULOCYTES
Immature Retic Fract: 7.1 % (ref 2.3–15.9)
RBC.: 2.06 MIL/uL — AB (ref 3.87–5.11)
RETIC COUNT ABSOLUTE: 162.1 10*3/uL (ref 19.0–186.0)
RETIC CT PCT: 7.9 % — AB (ref 0.4–3.1)

## 2018-08-05 LAB — PLATELET BY CITRATE

## 2018-08-05 LAB — FOLATE: FOLATE: 12.3 ng/mL (ref >5.9)

## 2018-08-05 MED ORDER — DARBEPOETIN ALFA 300 MCG/0.6ML IJ SOSY
300.0000 ug | PREFILLED_SYRINGE | Freq: Once | INTRAMUSCULAR | Status: AC
  Administered 2018-08-05: 300 ug via SUBCUTANEOUS

## 2018-08-05 MED ORDER — DARBEPOETIN ALFA 300 MCG/0.6ML IJ SOSY
PREFILLED_SYRINGE | INTRAMUSCULAR | Status: AC
  Filled 2018-08-05: qty 0.6

## 2018-08-05 NOTE — Patient Instructions (Signed)

## 2018-08-05 NOTE — Progress Notes (Signed)
Hematology and Oncology Follow Up Visit  Jamie Allen [id number] January 23, 1971 47 y.o. 08/05/2018   Principle Diagnosis:  Anemia - multi-factorial Cirrhosis w/ splenomegaly  Current Therapy:   Aranesp 300 mcg sq q 3 wks for Hgb < 11 IV Iron as needed Folic acid 1 mg po q day   Interim History: Jamie Allen is here today with her sister for follow-up. She is was hospitalized in September with pancreatitis. She is now home and feeling better. Her Hgb today is 7.5, MCV 110, platelets 63 and WBC 3.5.  She has had fatigue, some weakness and chills.  No fever, chills, cough, rash, dizziness, SOB, chest pain, palpitations or changes in bowel or bladder habits.  She has a little puffiness in both ankles. Pedal pulses +3.  No tenderness, numbness or tingling in her extremities at this time.  No lymphadenopathy noted on exam.  She has had no episodes of bleeding. She fell recently and has a bruise on her ankle that is healing nicely. No other bruises, no petechiae.  She has maintained a good appetite and is staying well hydrated. Her weight is stable.  She has episodes of GERD and states that Carafate has helped significantly with the nausea, dry heaves and belly pain.  She has a nicotine patch and both she and her sister are trying to stop smoking together. She states that they both stopped drinking alcohol a year ago.   ECOG Performance Status: 1 - Symptomatic but completely ambulatory  Medications:  Allergies as of 08/05/2018      Reactions   Iohexol Hives, Itching, Swelling   Lactose Intolerance (gi) Diarrhea, Nausea Only   Other Other (See Comments)   Spicy foods "tear up my stomach"   Vancomycin Itching   Nose itching   Morphine And Related Itching, Other (See Comments)   Can tolerate with Benadryl      Medication List        Accurate as of 08/05/18  2:06 PM. Always use your most recent med list.          diphenoxylate-atropine 2.5-0.025 MG tablet Commonly known as:   LOMOTIL Take 1 tablet by mouth 2 (two) times daily as needed for diarrhea or loose stools.   esomeprazole 20 MG capsule Commonly known as:  NEXIUM Take 1 capsule (20 mg total) by mouth daily.   folic acid 1 MG tablet Commonly known as:  FOLVITE Take 1 tablet (1 mg total) by mouth daily.   gabapentin 300 MG capsule Commonly known as:  NEURONTIN Take 1 capsule (300 mg total) by mouth 3 (three) times daily.   HYDROcodone-acetaminophen 7.5-325 MG tablet Commonly known as:  NORCO Take 1 tablet by mouth every 6 (six) hours as needed for moderate pain.   lipase/protease/amylase 36000 UNITS Cpep capsule Commonly known as:  CREON Take 2 capsules (72,000 Units total) by mouth 3 (three) times daily before meals.   Magnesium Oxide 500 MG Tabs Take 1 tablet (500 mg total) by mouth daily.   Melatonin 10 MG Tabs Take 10 mg by mouth at bedtime as needed (for sleep).   metoCLOPramide 10 MG tablet Commonly known as:  REGLAN Take 1 tablet (10 mg total) by mouth every 8 (eight) hours as needed for nausea.   nicotine 14 mg/24hr patch Commonly known as:  NICODERM CQ - dosed in mg/24 hours Place 1 patch (14 mg total) onto the skin daily.   ondansetron 4 MG tablet Commonly known as:  ZOFRAN Take 1 tablet (4 mg  total) by mouth every 6 (six) hours as needed for nausea.   sertraline 50 MG tablet Commonly known as:  ZOLOFT Take 1 tablet (50 mg total) by mouth daily.   sucralfate 1 GM/10ML suspension Commonly known as:  CARAFATE Take 10 mLs (1 g total) by mouth 4 (four) times daily -  with meals and at bedtime.       Allergies:  Allergies  Allergen Reactions  . Iohexol Hives, Itching and Swelling  . Lactose Intolerance (Gi) Diarrhea and Nausea Only  . Other Other (See Comments)    Spicy foods "tear up my stomach"  . Vancomycin Itching    Nose itching  . Morphine And Related Itching and Other (See Comments)    Can tolerate with Benadryl    Past Medical History, Surgical history,  Social history, and Family History were reviewed and updated.  Review of Systems: All other 10 point review of systems is negative.   Physical Exam:  weight is 119 lb (54 kg). Her oral temperature is 98.4 F (36.9 C). Her blood pressure is 109/44 (abnormal) and her pulse is 78. Her respiration is 14 and oxygen saturation is 99%.   Wt Readings from Last 3 Encounters:  08/05/18 119 lb (54 kg)  08/04/18 117 lb 9.6 oz (53.3 kg)  07/29/18 112 lb (50.8 kg)    Ocular: Sclerae unicteric, pupils equal, round and reactive to light Ear-nose-throat: Oropharynx clear, dentition fair Lymphatic: No cervical, supraclavicular or axillary adenopathy Lung sounds decreased, no rales or rhonchi, good excursion bilaterally Heart regular rate and rhythm, no murmur appreciated Abd soft, tender in right and left sides on palpation, positive bowel sounds, no liver or spleen tip palpated on exam, no fluid wave  MSK no focal spinal tenderness, no joint edema Neuro: non-focal, well-oriented, appropriate affect Breasts: Deferred   Lab Results  Component Value Date   WBC 3.5 (L) 08/05/2018   HGB 7.5 (L) 08/05/2018   HCT 23.2 (L) 08/05/2018   MCV 110.0 (H) 08/05/2018   PLT 63 (L) 08/05/2018   Lab Results  Component Value Date   FERRITIN 2,166 (H) 06/15/2018   IRON 152 (H) 06/15/2018   TIBC 160 (L) 06/15/2018   UIBC 8 06/15/2018   IRONPCTSAT 95 (H) 06/15/2018   Lab Results  Component Value Date   RETICCTPCT 6.3 (H) 05/28/2018   RBC 2.11 (L) 08/05/2018   Lab Results  Component Value Date   KPAFRELGTCHN 102.4 (H) 01/25/2018   LAMBDASER 61.9 (H) 01/25/2018   KAPLAMBRATIO 1.65 01/25/2018   Lab Results  Component Value Date   IGGSERUM 2,556 (H) 01/25/2018   IGA 169 01/25/2018   IGMSERUM 69 01/25/2018   Lab Results  Component Value Date   TOTALPROTELP 7.5 01/25/2018   ALBUMINELP 3.0 10/02/2015   MSPIKE Not Observed 10/02/2015     Chemistry      Component Value Date/Time   NA 135  07/27/2018 1519   K 3.4 (L) 07/27/2018 1519   CL 99 07/27/2018 1519   CO2 19 (L) 07/27/2018 1519   BUN 4 (L) 07/27/2018 1519   CREATININE 0.80 07/27/2018 1519   CREATININE 0.90 06/15/2018 1209   CREATININE 1.01 05/19/2016 1639      Component Value Date/Time   CALCIUM 9.2 07/27/2018 1519   ALKPHOS 165 (H) 07/27/2018 1519   AST 79 (H) 07/27/2018 1519   AST 95 (H) 06/15/2018 1209   ALT 28 07/27/2018 1519   ALT 38 06/15/2018 1209   BILITOT 6.1 (H) 07/27/2018 1519  BILITOT 4.7 (HH) 06/15/2018 1209      Impression and Plan: Ms. Sloss is a very pleasant 47 yo caucasian female with mild pancytopenia with history of cirrhosis and splenomegaly.  She recently recuperated from pancreatitis and is doing fairly well. She has had some fatigue, mild weakness and chills.  Hgb is 7.5, MCV 110, platelet count 63 and WBC count is 3.5.  She received Aranesp today. No platelets needed at this time.  I spoke with Dr. Maylon Peppers and we will also add iron studies, B12, copper, zinc and folate to his lab work.  We will plan to see her back in another 2 weeks.  They will contact our office with any questions or concerns. We can certainly see her sooner if need be.   Laverna Peace, NP 10/17/20192:06 PM

## 2018-08-06 ENCOUNTER — Encounter: Payer: Self-pay | Admitting: Family Medicine

## 2018-08-06 LAB — IRON AND TIBC
IRON: 55 ug/dL (ref 28–170)
Saturation Ratios: 30 % (ref 10.4–31.8)
TIBC: 182 ug/dL — ABNORMAL LOW (ref 250–450)
UIBC: 127 ug/dL

## 2018-08-06 LAB — VITAMIN B12: Vitamin B-12: 1585 pg/mL — ABNORMAL HIGH (ref 180–914)

## 2018-08-06 LAB — ERYTHROPOIETIN: ERYTHROPOIETIN: 86.4 m[IU]/mL — AB (ref 2.6–18.5)

## 2018-08-06 LAB — FERRITIN: Ferritin: 846 ng/mL — ABNORMAL HIGH (ref 11–307)

## 2018-08-07 LAB — ZINC: Zinc: 49 ug/dL — ABNORMAL LOW (ref 56–134)

## 2018-08-07 LAB — COPPER, SERUM: Copper: 112 ug/dL (ref 72–166)

## 2018-08-13 ENCOUNTER — Ambulatory Visit: Payer: Self-pay | Admitting: Gastroenterology

## 2018-08-19 ENCOUNTER — Inpatient Hospital Stay: Payer: Medicaid Other

## 2018-08-19 ENCOUNTER — Encounter: Payer: Self-pay | Admitting: Family

## 2018-08-19 ENCOUNTER — Telehealth: Payer: Self-pay | Admitting: *Deleted

## 2018-08-19 ENCOUNTER — Inpatient Hospital Stay (HOSPITAL_BASED_OUTPATIENT_CLINIC_OR_DEPARTMENT_OTHER): Payer: Medicaid Other | Admitting: Family

## 2018-08-19 VITALS — BP 121/48 | HR 67 | Temp 98.7°F | Resp 16 | Wt 119.5 lb

## 2018-08-19 DIAGNOSIS — G629 Polyneuropathy, unspecified: Secondary | ICD-10-CM

## 2018-08-19 DIAGNOSIS — D649 Anemia, unspecified: Secondary | ICD-10-CM

## 2018-08-19 DIAGNOSIS — D696 Thrombocytopenia, unspecified: Secondary | ICD-10-CM

## 2018-08-19 DIAGNOSIS — D61818 Other pancytopenia: Secondary | ICD-10-CM

## 2018-08-19 DIAGNOSIS — K7460 Unspecified cirrhosis of liver: Secondary | ICD-10-CM | POA: Diagnosis not present

## 2018-08-19 DIAGNOSIS — D638 Anemia in other chronic diseases classified elsewhere: Secondary | ICD-10-CM

## 2018-08-19 DIAGNOSIS — R161 Splenomegaly, not elsewhere classified: Secondary | ICD-10-CM

## 2018-08-19 DIAGNOSIS — D539 Nutritional anemia, unspecified: Secondary | ICD-10-CM

## 2018-08-19 LAB — CBC WITH DIFFERENTIAL (CANCER CENTER ONLY)
BASOS ABS: 0 10*3/uL (ref 0.0–0.1)
BASOS PCT: 1 %
Band Neutrophils: 0 %
EOS PCT: 5 %
Eosinophils Absolute: 0.2 10*3/uL (ref 0.0–0.5)
HCT: 25.6 % — ABNORMAL LOW (ref 36.0–46.0)
Hemoglobin: 8.1 g/dL — ABNORMAL LOW (ref 12.0–15.0)
LYMPHS ABS: 0.6 10*3/uL — AB (ref 0.7–4.0)
Lymphocytes Relative: 16 %
MCH: 35.7 pg — ABNORMAL HIGH (ref 26.0–34.0)
MCHC: 31.6 g/dL (ref 30.0–36.0)
MCV: 112.8 fL — ABNORMAL HIGH (ref 80.0–100.0)
Monocytes Absolute: 0.2 10*3/uL (ref 0.1–1.0)
Monocytes Relative: 4 %
NEUTROS ABS: 2.7 10*3/uL (ref 1.7–7.7)
NEUTROS PCT: 74 %
Platelet Count: 60 10*3/uL — ABNORMAL LOW (ref 150–400)
RBC: 2.27 MIL/uL — ABNORMAL LOW (ref 3.87–5.11)
RDW: 16.5 % — AB (ref 11.5–15.5)
WBC: 3.6 10*3/uL — AB (ref 4.0–10.5)
nRBC: 0 % (ref 0.0–0.2)

## 2018-08-19 LAB — SAMPLE TO BLOOD BANK

## 2018-08-19 LAB — CMP (CANCER CENTER ONLY)
ALT: 21 U/L (ref 0–44)
ANION GAP: 10 (ref 5–15)
AST: 47 U/L — ABNORMAL HIGH (ref 15–41)
Albumin: 2.9 g/dL — ABNORMAL LOW (ref 3.5–5.0)
Alkaline Phosphatase: 182 U/L — ABNORMAL HIGH (ref 38–126)
BUN: 8 mg/dL (ref 6–20)
CHLORIDE: 104 mmol/L (ref 98–111)
CO2: 27 mmol/L (ref 22–32)
CREATININE: 1.01 mg/dL — AB (ref 0.44–1.00)
Calcium: 8.7 mg/dL — ABNORMAL LOW (ref 8.9–10.3)
GFR, Estimated: >60 mL/min (ref >60)
Glucose, Bld: 135 mg/dL — ABNORMAL HIGH (ref 70–99)
POTASSIUM: 3.2 mmol/L — AB (ref 3.5–5.1)
Sodium: 141 mmol/L (ref 135–145)
Total Bilirubin: 3.7 mg/dL (ref 0.3–1.2)
Total Protein: 7 g/dL (ref 6.5–8.1)

## 2018-08-19 LAB — SAVE SMEAR(SSMR), FOR PROVIDER SLIDE REVIEW

## 2018-08-19 MED ORDER — DARBEPOETIN ALFA 300 MCG/0.6ML IJ SOSY
PREFILLED_SYRINGE | INTRAMUSCULAR | Status: AC
  Filled 2018-08-19: qty 0.6

## 2018-08-19 MED ORDER — DARBEPOETIN ALFA 300 MCG/0.6ML IJ SOSY
300.0000 ug | PREFILLED_SYRINGE | Freq: Once | INTRAMUSCULAR | Status: AC
  Administered 2018-08-19: 300 ug via SUBCUTANEOUS

## 2018-08-19 NOTE — Progress Notes (Addendum)
Hematology and Oncology Follow Up Visit  Jamie Allen [id number] 11-13-1970 47 y.o. 08/19/2018   Principle Diagnosis:  Anemia - multi-factorial Cirrhosis w/ splenomegaly  Current Therapy:   Aranesp 300 mcg sq q 3 wks for Hgb < 11 IV Iron as needed Folic acid 1 mg po q day   Interim History:  Jamie Allen is here today with her significant other, Jamie Allen, for follow-up. She had what she thinks was a stomach virus earlier this week. He sister also had.  She has tried to eat but appetite has been down some due to the nausea and diarrhea. She has felt better over the last 2 days.  She has an appointment with GI coming up in November. I suggested if she has any more episodes of diarrhea and nausea to keep a journal.  She is staying hydrated. Her weight is stable.  No swelling in her extremities at this time. The neuropathy in her feet and hands is unchanged.  No lymphadenopathy noted on exam.  No episodes of bleeding, no abnormal bruising or petechiae noted at this time.    ECOG Performance Status: 1 - Symptomatic but completely ambulatory  Medications:  Allergies as of 08/19/2018      Reactions   Iohexol Hives, Itching, Swelling   Lactose Intolerance (gi) Diarrhea, Nausea Only   Other Other (See Comments)   Spicy foods "tear up my stomach"   Vancomycin Itching   Nose itching   Morphine And Related Itching, Other (See Comments)   Can tolerate with Benadryl      Medication List        Accurate as of 08/19/18 11:22 AM. Always use your most recent med list.          diphenoxylate-atropine 2.5-0.025 MG tablet Commonly known as:  LOMOTIL Take 1 tablet by mouth 2 (two) times daily as needed for diarrhea or loose stools.   esomeprazole 20 MG capsule Commonly known as:  NEXIUM Take 1 capsule (20 mg total) by mouth daily.   folic acid 1 MG tablet Commonly known as:  FOLVITE Take 1 tablet (1 mg total) by mouth daily.   gabapentin 300 MG capsule Commonly known as:   NEURONTIN Take 1 capsule (300 mg total) by mouth 3 (three) times daily.   lipase/protease/amylase 36000 UNITS Cpep capsule Commonly known as:  CREON Take 2 capsules (72,000 Units total) by mouth 3 (three) times daily before meals.   Magnesium Oxide 500 MG Tabs Take 1 tablet (500 mg total) by mouth daily.   Melatonin 10 MG Tabs Take 10 mg by mouth at bedtime as needed (for sleep).   metoCLOPramide 10 MG tablet Commonly known as:  REGLAN Take 1 tablet (10 mg total) by mouth every 8 (eight) hours as needed for nausea.   NARCAN 4 MG/0.1ML Liqd nasal spray kit Generic drug:  naloxone USE 1 SPRAY IN EACH NOSTRILL AS NEEDED FOR ACCIDENTAL OVERDOSE   nicotine 14 mg/24hr patch Commonly known as:  NICODERM CQ - dosed in mg/24 hours Place 1 patch (14 mg total) onto the skin daily.   ondansetron 4 MG tablet Commonly known as:  ZOFRAN Take 1 tablet (4 mg total) by mouth every 6 (six) hours as needed for nausea.   oxyCODONE 5 MG immediate release tablet Commonly known as:  Oxy IR/ROXICODONE Take 5 mg by mouth 2 (two) times daily as needed.   sertraline 50 MG tablet Commonly known as:  ZOLOFT Take 1 tablet (50 mg total) by mouth daily.  sucralfate 1 GM/10ML suspension Commonly known as:  CARAFATE Take 10 mLs (1 g total) by mouth 4 (four) times daily -  with meals and at bedtime.       Allergies:  Allergies  Allergen Reactions  . Iohexol Hives, Itching and Swelling  . Lactose Intolerance (Gi) Diarrhea and Nausea Only  . Other Other (See Comments)    Spicy foods "tear up my stomach"  . Vancomycin Itching    Nose itching  . Morphine And Related Itching and Other (See Comments)    Can tolerate with Benadryl    Past Medical History, Surgical history, Social history, and Family History were reviewed and updated.  Review of Systems: All other 10 point review of systems is negative.   Physical Exam:  weight is 119 lb 8 oz (54.2 kg). Her oral temperature is 98.7 F (37.1 C).  Her blood pressure is 121/48 (abnormal) and her pulse is 67. Her respiration is 16 and oxygen saturation is 99%.   Wt Readings from Last 3 Encounters:  08/19/18 119 lb 8 oz (54.2 kg)  08/05/18 119 lb (54 kg)  08/04/18 117 lb 9.6 oz (53.3 kg)    Ocular: Sclerae unicteric, pupils equal, round and reactive to light Ear-nose-throat: Oropharynx clear, dentition fair Lymphatic: No cervical, supraclavicular or axillary adenopathy Lungs no rales or rhonchi, good excursion bilaterally Heart regular rate and rhythm, no murmur appreciated Abd full with tenderness in the right and left side secondary to cirrhosis and splenomegaly, positive bowel sounds,  MSK no focal spinal tenderness, no joint edema Neuro: non-focal, well-oriented, appropriate affect Breasts: Deferred   Lab Results  Component Value Date   WBC 3.6 (L) 08/19/2018   HGB 8.1 (L) 08/19/2018   HCT 25.6 (L) 08/19/2018   MCV 112.8 (H) 08/19/2018   PLT 60 (L) 08/19/2018   Lab Results  Component Value Date   FERRITIN 846 (H) 08/05/2018   IRON 55 08/05/2018   TIBC 182 (L) 08/05/2018   UIBC 127 08/05/2018   IRONPCTSAT 30 08/05/2018   Lab Results  Component Value Date   RETICCTPCT 7.9 (H) 08/05/2018   RBC 2.27 (L) 08/19/2018   Lab Results  Component Value Date   KPAFRELGTCHN 102.4 (H) 01/25/2018   LAMBDASER 61.9 (H) 01/25/2018   KAPLAMBRATIO 1.65 01/25/2018   Lab Results  Component Value Date   IGGSERUM 2,556 (H) 01/25/2018   IGA 169 01/25/2018   IGMSERUM 69 01/25/2018   Lab Results  Component Value Date   TOTALPROTELP 7.5 01/25/2018   ALBUMINELP 3.0 10/02/2015   MSPIKE Not Observed 10/02/2015     Chemistry      Component Value Date/Time   NA 138 08/05/2018 1335   NA 135 07/27/2018 1519   K 3.7 08/05/2018 1335   CL 106 08/05/2018 1335   CO2 28 08/05/2018 1335   BUN 9 08/05/2018 1335   BUN 4 (L) 07/27/2018 1519   CREATININE 0.90 08/05/2018 1335   CREATININE 1.01 05/19/2016 1639      Component Value  Date/Time   CALCIUM 8.9 08/05/2018 1335   ALKPHOS 170 (H) 08/05/2018 1335   AST 64 (H) 08/05/2018 1335   ALT 28 08/05/2018 1335   BILITOT 3.2 (H) 08/05/2018 1335      Impression and Plan: Jamie Allen is a very pleasant 47 yo caucasian female with mild pancytopenia with cirrhosis and splenomegaly.  She is feeling better after having a stomach virus earlier this month.  Her Hgb is now 8.1 with MCV 112. I spoke with  Dr. Marin Olp and we will hold off on doing a bone marrow biopsy for now.  We will give her Aranesp today.  We will see her again in another 6 weeks.   She will contact our office with any questions or concerns. We can certainly see her sooner if need be.   Laverna Peace, NP 10/31/201911:22 AM

## 2018-08-19 NOTE — Telephone Encounter (Signed)
Jory Ee NP notified of bilirubin 3.7.  No new orders received.

## 2018-09-06 ENCOUNTER — Ambulatory Visit: Payer: Medicaid Other | Admitting: Gastroenterology

## 2018-09-06 ENCOUNTER — Encounter: Payer: Self-pay | Admitting: Gastroenterology

## 2018-09-06 VITALS — BP 118/76 | HR 68 | Ht 61.0 in | Wt 116.4 lb

## 2018-09-06 DIAGNOSIS — D61818 Other pancytopenia: Secondary | ICD-10-CM

## 2018-09-06 DIAGNOSIS — K529 Noninfective gastroenteritis and colitis, unspecified: Secondary | ICD-10-CM | POA: Diagnosis not present

## 2018-09-06 DIAGNOSIS — R110 Nausea: Secondary | ICD-10-CM

## 2018-09-06 DIAGNOSIS — K7030 Alcoholic cirrhosis of liver without ascites: Secondary | ICD-10-CM | POA: Diagnosis not present

## 2018-09-06 DIAGNOSIS — E60 Dietary zinc deficiency: Secondary | ICD-10-CM

## 2018-09-06 MED ORDER — ONDANSETRON 4 MG PO TBDP: 4.0000 mg | ORAL_TABLET | Freq: Three times a day (TID) | ORAL | 3 refills | Status: AC | PRN

## 2018-09-06 MED ORDER — ZINC SULFATE 220 (50 ZN) MG PO CAPS: 220.0000 mg | ORAL_CAPSULE | Freq: Every day | ORAL | 1 refills | Status: AC

## 2018-09-06 MED ORDER — DIPHENOXYLATE-ATROPINE 2.5-0.025 MG PO TABS: 1.0000 | ORAL_TABLET | Freq: Two times a day (BID) | ORAL | 3 refills | Status: DC | PRN
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
## 2018-09-06 NOTE — Patient Instructions (Addendum)
If you are age 48 or older, your body mass index should be between 23-30. Your Body mass index is 21.99 kg/m. If this is out of the aforementioned range listed, please consider follow up with your Primary Care Provider.  If you are age 60 or younger, your body mass index should be between 19-25. Your Body mass index is 21.99 kg/m. If this is out of the aformentioned range listed, please consider follow up with your Primary Care Provider.   We have sent the following medications to your pharmacy for you to pick up at your convenience: Lomotil: Take twice a day as needed  Zofran 4mg  - ODT Take every 8 hours as needed  Zinc Sulfate 220mg : Take daily  We would like to see you back in the office in 3 months (February) for a follow up appointment. Please call in early January to schedule an appointment.  We have entered an AFP lab to be done at your next blood draw.  Please make them aware of the order at that time.   Thank you for entrusting me with your care and for choosing [facility], Dr. Franklin Cellar

## 2018-09-14 ENCOUNTER — Ambulatory Visit: Payer: Self-pay | Admitting: Family Medicine

## 2018-09-25 ENCOUNTER — Other Ambulatory Visit: Payer: Self-pay

## 2018-09-25 ENCOUNTER — Emergency Department (HOSPITAL_COMMUNITY): Payer: Medicaid Other

## 2018-09-25 ENCOUNTER — Inpatient Hospital Stay (HOSPITAL_COMMUNITY)
Admission: EM | Admit: 2018-09-25 | Discharge: 2018-09-28 | DRG: 433 | Disposition: A | Discharge status: Home / Self Care | Payer: Medicaid Other | Attending: Internal Medicine | Admitting: Internal Medicine

## 2018-09-25 ENCOUNTER — Encounter (HOSPITAL_COMMUNITY): Payer: Self-pay | Admitting: *Deleted

## 2018-09-25 DIAGNOSIS — N390 Urinary tract infection, site not specified: Secondary | ICD-10-CM | POA: Diagnosis present

## 2018-09-25 DIAGNOSIS — Z885 Allergy status to narcotic agent status: Secondary | ICD-10-CM

## 2018-09-25 DIAGNOSIS — Z803 Family history of malignant neoplasm of breast: Secondary | ICD-10-CM

## 2018-09-25 DIAGNOSIS — K219 Gastro-esophageal reflux disease without esophagitis: Secondary | ICD-10-CM | POA: Diagnosis present

## 2018-09-25 DIAGNOSIS — K7031 Alcoholic cirrhosis of liver with ascites: Secondary | ICD-10-CM

## 2018-09-25 DIAGNOSIS — Z8349 Family history of other endocrine, nutritional and metabolic diseases: Secondary | ICD-10-CM

## 2018-09-25 DIAGNOSIS — F17210 Nicotine dependence, cigarettes, uncomplicated: Secondary | ICD-10-CM | POA: Diagnosis present

## 2018-09-25 DIAGNOSIS — E60 Dietary zinc deficiency: Secondary | ICD-10-CM | POA: Diagnosis present

## 2018-09-25 DIAGNOSIS — Z8619 Personal history of other infectious and parasitic diseases: Secondary | ICD-10-CM

## 2018-09-25 DIAGNOSIS — K766 Portal hypertension: Secondary | ICD-10-CM | POA: Diagnosis present

## 2018-09-25 DIAGNOSIS — K7460 Unspecified cirrhosis of liver: Secondary | ICD-10-CM | POA: Diagnosis present

## 2018-09-25 DIAGNOSIS — D61818 Other pancytopenia: Secondary | ICD-10-CM | POA: Diagnosis present

## 2018-09-25 DIAGNOSIS — Z800 Family history of malignant neoplasm of digestive organs: Secondary | ICD-10-CM

## 2018-09-25 DIAGNOSIS — Z881 Allergy status to other antibiotic agents status: Secondary | ICD-10-CM

## 2018-09-25 DIAGNOSIS — K08109 Complete loss of teeth, unspecified cause, unspecified class: Secondary | ICD-10-CM | POA: Diagnosis present

## 2018-09-25 DIAGNOSIS — K7581 Nonalcoholic steatohepatitis (NASH): Secondary | ICD-10-CM | POA: Diagnosis present

## 2018-09-25 DIAGNOSIS — E739 Lactose intolerance, unspecified: Secondary | ICD-10-CM | POA: Diagnosis present

## 2018-09-25 DIAGNOSIS — R109 Unspecified abdominal pain: Secondary | ICD-10-CM

## 2018-09-25 DIAGNOSIS — F329 Major depressive disorder, single episode, unspecified: Secondary | ICD-10-CM | POA: Diagnosis present

## 2018-09-25 DIAGNOSIS — Z91041 Radiographic dye allergy status: Secondary | ICD-10-CM

## 2018-09-25 DIAGNOSIS — Z8249 Family history of ischemic heart disease and other diseases of the circulatory system: Secondary | ICD-10-CM

## 2018-09-25 DIAGNOSIS — R197 Diarrhea, unspecified: Secondary | ICD-10-CM | POA: Diagnosis present

## 2018-09-25 DIAGNOSIS — D696 Thrombocytopenia, unspecified: Secondary | ICD-10-CM | POA: Diagnosis present

## 2018-09-25 DIAGNOSIS — F1021 Alcohol dependence, in remission: Secondary | ICD-10-CM | POA: Diagnosis present

## 2018-09-25 DIAGNOSIS — R188 Other ascites: Secondary | ICD-10-CM

## 2018-09-25 DIAGNOSIS — G629 Polyneuropathy, unspecified: Secondary | ICD-10-CM | POA: Diagnosis present

## 2018-09-25 DIAGNOSIS — R600 Localized edema: Secondary | ICD-10-CM | POA: Diagnosis present

## 2018-09-25 DIAGNOSIS — Z9049 Acquired absence of other specified parts of digestive tract: Secondary | ICD-10-CM

## 2018-09-25 DIAGNOSIS — G8929 Other chronic pain: Secondary | ICD-10-CM | POA: Diagnosis present

## 2018-09-25 DIAGNOSIS — D638 Anemia in other chronic diseases classified elsewhere: Secondary | ICD-10-CM | POA: Diagnosis present

## 2018-09-25 DIAGNOSIS — K8689 Other specified diseases of pancreas: Secondary | ICD-10-CM | POA: Diagnosis present

## 2018-09-25 DIAGNOSIS — K838 Other specified diseases of biliary tract: Secondary | ICD-10-CM | POA: Diagnosis present

## 2018-09-25 DIAGNOSIS — E876 Hypokalemia: Secondary | ICD-10-CM | POA: Diagnosis present

## 2018-09-25 LAB — URINALYSIS, ROUTINE W REFLEX MICROSCOPIC
Glucose, UA: NEGATIVE mg/dL
Hgb urine dipstick: NEGATIVE
Ketones, ur: NEGATIVE mg/dL
Nitrite: NEGATIVE
Protein, ur: NEGATIVE mg/dL
Specific Gravity, Urine: 1.017 (ref 1.005–1.030)
pH: 7 (ref 5.0–8.0)

## 2018-09-25 LAB — COMPREHENSIVE METABOLIC PANEL
ALBUMIN: 3.6 g/dL (ref 3.5–5.0)
ALT: 25 U/L (ref 0–44)
ANION GAP: 9 (ref 5–15)
AST: 59 U/L — AB (ref 15–41)
Alkaline Phosphatase: 145 U/L — ABNORMAL HIGH (ref 38–126)
BUN: 5 mg/dL — ABNORMAL LOW (ref 6–20)
CO2: 26 mmol/L (ref 22–32)
Calcium: 9.3 mg/dL (ref 8.9–10.3)
Chloride: 102 mmol/L (ref 98–111)
Creatinine, Ser: 0.66 mg/dL (ref 0.44–1.00)
GFR calc Af Amer: >60 mL/min (ref >60)
GFR calc non Af Amer: >60 mL/min (ref >60)
GLUCOSE: 109 mg/dL — AB (ref 70–99)
Potassium: 3.3 mmol/L — ABNORMAL LOW (ref 3.5–5.1)
Sodium: 137 mmol/L (ref 135–145)
Total Bilirubin: 7.3 mg/dL — ABNORMAL HIGH (ref 0.3–1.2)
Total Protein: 7.8 g/dL (ref 6.5–8.1)

## 2018-09-25 LAB — I-STAT BETA HCG BLOOD, ED (MC, WL, AP ONLY): I-stat hCG, quantitative: <5 m[IU]/mL (ref <5)

## 2018-09-25 LAB — PROTIME-INR
INR: 1.33
Prothrombin Time: 16.4 seconds — ABNORMAL HIGH (ref 11.4–15.2)

## 2018-09-25 LAB — CBC
HCT: 25.4 % — ABNORMAL LOW (ref 36.0–46.0)
Hemoglobin: 7.9 g/dL — ABNORMAL LOW (ref 12.0–15.0)
MCH: 36.2 pg — ABNORMAL HIGH (ref 26.0–34.0)
MCHC: 31.1 g/dL (ref 30.0–36.0)
MCV: 116.5 fL — AB (ref 80.0–100.0)
Platelets: 70 10*3/uL — ABNORMAL LOW (ref 150–400)
RBC: 2.18 MIL/uL — AB (ref 3.87–5.11)
RDW: 16.7 % — ABNORMAL HIGH (ref 11.5–15.5)
WBC: 8.3 10*3/uL (ref 4.0–10.5)
nRBC: 0 % (ref 0.0–0.2)

## 2018-09-25 LAB — LIPASE, BLOOD: Lipase: 28 U/L (ref 11–51)

## 2018-09-25 LAB — MAGNESIUM: Magnesium: 2 mg/dL (ref 1.7–2.4)

## 2018-09-25 MED ORDER — FENTANYL CITRATE (PF) 100 MCG/2ML IJ SOLN
25.0000 ug | Freq: Once | INTRAMUSCULAR | Status: AC
  Administered 2018-09-25: 25 ug via INTRAVENOUS
  Filled 2018-09-25: qty 2

## 2018-09-25 MED ORDER — FENTANYL CITRATE (PF) 100 MCG/2ML IJ SOLN
50.0000 ug | Freq: Once | INTRAMUSCULAR | Status: DC
  Filled 2018-09-25: qty 2

## 2018-09-25 MED ORDER — ONDANSETRON HCL 4 MG/2ML IJ SOLN
4.0000 mg | Freq: Once | INTRAMUSCULAR | Status: AC
  Administered 2018-09-25: 4 mg via INTRAVENOUS
  Filled 2018-09-25: qty 2

## 2018-09-25 MED ORDER — MORPHINE SULFATE (PF) 4 MG/ML IV SOLN: 4.0000 mg | Freq: Once | INTRAVENOUS | Status: DC

## 2018-09-25 MED ORDER — HYDROMORPHONE HCL 1 MG/ML IJ SOLN
1.0000 mg | Freq: Once | INTRAMUSCULAR | Status: AC
  Administered 2018-09-25: 1 mg via INTRAVENOUS
  Filled 2018-09-25: qty 1

## 2018-09-25 NOTE — ED Notes (Signed)
Patient made aware that urine sample is needed.  

## 2018-09-25 NOTE — ED Notes (Signed)
Patient transported to CT 

## 2018-09-25 NOTE — ED Notes (Signed)
Attempted to start IV x 2 unsuccessfully. Notified another RN to draw labs and start IV
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
## 2018-09-25 NOTE — ED Triage Notes (Signed)
Pt reports abd swelling since Thanksgiving and vomiting x 3 days.  Pt reports non-alcoholic fatty liver disease, pancreatitis, a "swollen spleen."  Pt also reports right abd pain and left abd pain.  Pt reports having her abd drained in the past.  Pt reports being able to tolerate PO fluids.  Pt a/o x 4 and ambulatory.

## 2018-09-25 NOTE — ED Notes (Signed)
Completed in and out on patient using sterile procedure. Patient has milky white discharge coming from her vagina and has had this for a few days. Denies pain or itching in perineal area. States that when she begins to void, she feels as if her urine is really hot, but does not have burning or pain.

## 2018-09-25 NOTE — ED Notes (Signed)
Patient states that she vomited x1 after her CT scan and that she is feeling some relief now. There was a small amount of green vomit in the emesis basin. Patient still rates pain 10/10.

## 2018-09-26 ENCOUNTER — Inpatient Hospital Stay (HOSPITAL_COMMUNITY): Payer: Medicaid Other

## 2018-09-26 DIAGNOSIS — R188 Other ascites: Secondary | ICD-10-CM | POA: Diagnosis present

## 2018-09-26 DIAGNOSIS — K7581 Nonalcoholic steatohepatitis (NASH): Secondary | ICD-10-CM | POA: Diagnosis present

## 2018-09-26 DIAGNOSIS — R945 Abnormal results of liver function studies: Secondary | ICD-10-CM | POA: Diagnosis not present

## 2018-09-26 DIAGNOSIS — Z803 Family history of malignant neoplasm of breast: Secondary | ICD-10-CM | POA: Diagnosis not present

## 2018-09-26 DIAGNOSIS — F329 Major depressive disorder, single episode, unspecified: Secondary | ICD-10-CM | POA: Diagnosis present

## 2018-09-26 DIAGNOSIS — D61818 Other pancytopenia: Secondary | ICD-10-CM | POA: Diagnosis present

## 2018-09-26 DIAGNOSIS — Z8249 Family history of ischemic heart disease and other diseases of the circulatory system: Secondary | ICD-10-CM | POA: Diagnosis not present

## 2018-09-26 DIAGNOSIS — Z800 Family history of malignant neoplasm of digestive organs: Secondary | ICD-10-CM | POA: Diagnosis not present

## 2018-09-26 DIAGNOSIS — Z8349 Family history of other endocrine, nutritional and metabolic diseases: Secondary | ICD-10-CM | POA: Diagnosis not present

## 2018-09-26 DIAGNOSIS — K219 Gastro-esophageal reflux disease without esophagitis: Secondary | ICD-10-CM | POA: Diagnosis present

## 2018-09-26 DIAGNOSIS — E60 Dietary zinc deficiency: Secondary | ICD-10-CM | POA: Diagnosis present

## 2018-09-26 DIAGNOSIS — R1084 Generalized abdominal pain: Secondary | ICD-10-CM | POA: Diagnosis not present

## 2018-09-26 DIAGNOSIS — K766 Portal hypertension: Secondary | ICD-10-CM | POA: Diagnosis present

## 2018-09-26 DIAGNOSIS — E876 Hypokalemia: Secondary | ICD-10-CM | POA: Diagnosis present

## 2018-09-26 DIAGNOSIS — K838 Other specified diseases of biliary tract: Secondary | ICD-10-CM

## 2018-09-26 DIAGNOSIS — F17210 Nicotine dependence, cigarettes, uncomplicated: Secondary | ICD-10-CM | POA: Diagnosis present

## 2018-09-26 DIAGNOSIS — N390 Urinary tract infection, site not specified: Secondary | ICD-10-CM | POA: Diagnosis present

## 2018-09-26 DIAGNOSIS — K7031 Alcoholic cirrhosis of liver with ascites: Secondary | ICD-10-CM | POA: Diagnosis present

## 2018-09-26 DIAGNOSIS — R197 Diarrhea, unspecified: Secondary | ICD-10-CM | POA: Diagnosis present

## 2018-09-26 DIAGNOSIS — F1021 Alcohol dependence, in remission: Secondary | ICD-10-CM | POA: Diagnosis present

## 2018-09-26 DIAGNOSIS — G8929 Other chronic pain: Secondary | ICD-10-CM | POA: Diagnosis present

## 2018-09-26 DIAGNOSIS — R600 Localized edema: Secondary | ICD-10-CM | POA: Diagnosis present

## 2018-09-26 DIAGNOSIS — Z8619 Personal history of other infectious and parasitic diseases: Secondary | ICD-10-CM | POA: Diagnosis not present

## 2018-09-26 DIAGNOSIS — K8689 Other specified diseases of pancreas: Secondary | ICD-10-CM | POA: Diagnosis present

## 2018-09-26 DIAGNOSIS — D696 Thrombocytopenia, unspecified: Secondary | ICD-10-CM | POA: Diagnosis present

## 2018-09-26 DIAGNOSIS — D638 Anemia in other chronic diseases classified elsewhere: Secondary | ICD-10-CM | POA: Diagnosis present

## 2018-09-26 DIAGNOSIS — G629 Polyneuropathy, unspecified: Secondary | ICD-10-CM | POA: Diagnosis present

## 2018-09-26 LAB — CBC WITH DIFFERENTIAL/PLATELET
Abs Immature Granulocytes: 0.13 10*3/uL — ABNORMAL HIGH (ref 0.00–0.07)
BASOS PCT: 1 %
Basophils Absolute: 0 10*3/uL (ref 0.0–0.1)
Eosinophils Absolute: 0.9 10*3/uL — ABNORMAL HIGH (ref 0.0–0.5)
Eosinophils Relative: 16 %
HCT: 22.8 % — ABNORMAL LOW (ref 36.0–46.0)
Hemoglobin: 7.1 g/dL — ABNORMAL LOW (ref 12.0–15.0)
Immature Granulocytes: 2 %
Lymphocytes Relative: 12 %
Lymphs Abs: 0.7 10*3/uL (ref 0.7–4.0)
MCH: 35.5 pg — ABNORMAL HIGH (ref 26.0–34.0)
MCHC: 31.1 g/dL (ref 30.0–36.0)
MCV: 114 fL — AB (ref 80.0–100.0)
Monocytes Absolute: 0.3 10*3/uL (ref 0.1–1.0)
Monocytes Relative: 5 %
Neutro Abs: 3.6 10*3/uL (ref 1.7–7.7)
Neutrophils Relative %: 64 %
Platelets: 58 10*3/uL — ABNORMAL LOW (ref 150–400)
RBC: 2 MIL/uL — ABNORMAL LOW (ref 3.87–5.11)
RDW: 17.1 % — ABNORMAL HIGH (ref 11.5–15.5)
WBC: 5.6 10*3/uL (ref 4.0–10.5)
nRBC: 0 % (ref 0.0–0.2)

## 2018-09-26 LAB — COMPREHENSIVE METABOLIC PANEL
ALT: 22 U/L (ref 0–44)
AST: 48 U/L — ABNORMAL HIGH (ref 15–41)
Albumin: 3 g/dL — ABNORMAL LOW (ref 3.5–5.0)
Alkaline Phosphatase: 110 U/L (ref 38–126)
Anion gap: 9 (ref 5–15)
BUN: 5 mg/dL — AB (ref 6–20)
CO2: 27 mmol/L (ref 22–32)
Calcium: 8.7 mg/dL — ABNORMAL LOW (ref 8.9–10.3)
Chloride: 102 mmol/L (ref 98–111)
Creatinine, Ser: 0.64 mg/dL (ref 0.44–1.00)
GFR calc Af Amer: >60 mL/min (ref >60)
GFR calc non Af Amer: >60 mL/min (ref >60)
Glucose, Bld: 100 mg/dL — ABNORMAL HIGH (ref 70–99)
Potassium: 3.6 mmol/L (ref 3.5–5.1)
Sodium: 138 mmol/L (ref 135–145)
Total Bilirubin: 5.9 mg/dL — ABNORMAL HIGH (ref 0.3–1.2)
Total Protein: 6.7 g/dL (ref 6.5–8.1)

## 2018-09-26 LAB — BODY FLUID CELL COUNT WITH DIFFERENTIAL
Eos, Fluid: 31 %
LYMPHS FL: 26 %
Monocyte-Macrophage-Serous Fluid: 34 % — ABNORMAL LOW (ref 50–90)
Neutrophil Count, Fluid: 9 % (ref 0–25)
Total Nucleated Cell Count, Fluid: 132 cu mm (ref 0–1000)

## 2018-09-26 LAB — PROTEIN, PLEURAL OR PERITONEAL FLUID: Total protein, fluid: <3 g/dL

## 2018-09-26 LAB — PROTIME-INR
INR: 1.44
PROTHROMBIN TIME: 17.4 s — AB (ref 11.4–15.2)

## 2018-09-26 LAB — LACTATE DEHYDROGENASE, PLEURAL OR PERITONEAL FLUID: LD, Fluid: 43 U/L — ABNORMAL HIGH (ref 3–23)

## 2018-09-26 LAB — ALBUMIN, PLEURAL OR PERITONEAL FLUID: Albumin, Fluid: <1 g/dL

## 2018-09-26 MED ORDER — SPIRONOLACTONE 25 MG PO TABS
50.0000 mg | ORAL_TABLET | Freq: Every day | ORAL | Status: DC
  Administered 2018-09-26 – 2018-09-28 (×4): 50 mg via ORAL
  Filled 2018-09-26 (×4): qty 2

## 2018-09-26 MED ORDER — FOLIC ACID 1 MG PO TABS
1.0000 mg | ORAL_TABLET | Freq: Every day | ORAL | Status: DC
  Administered 2018-09-26 – 2018-09-28 (×3): 1 mg via ORAL
  Filled 2018-09-26 (×3): qty 1

## 2018-09-26 MED ORDER — GABAPENTIN 300 MG PO CAPS
300.0000 mg | ORAL_CAPSULE | Freq: Three times a day (TID) | ORAL | Status: DC
  Administered 2018-09-26 – 2018-09-28 (×7): 300 mg via ORAL
  Filled 2018-09-26 (×7): qty 1

## 2018-09-26 MED ORDER — FUROSEMIDE 10 MG/ML IJ SOLN
20.0000 mg | Freq: Once | INTRAMUSCULAR | Status: AC
  Administered 2018-09-26: 20 mg via INTRAVENOUS
  Filled 2018-09-26: qty 4

## 2018-09-26 MED ORDER — PANCRELIPASE (LIP-PROT-AMYL) 12000-38000 UNITS PO CPEP
72000.0000 [IU] | ORAL_CAPSULE | Freq: Three times a day (TID) | ORAL | Status: DC
  Administered 2018-09-27 – 2018-09-28 (×4): 72000 [IU] via ORAL
  Filled 2018-09-26 (×5): qty 6
  Filled 2018-09-26: qty 2

## 2018-09-26 MED ORDER — OXYCODONE HCL 5 MG PO TABS
5.0000 mg | ORAL_TABLET | Freq: Four times a day (QID) | ORAL | Status: DC | PRN
  Administered 2018-09-26 – 2018-09-28 (×2): 5 mg via ORAL
  Filled 2018-09-26 (×2): qty 1

## 2018-09-26 MED ORDER — ONDANSETRON HCL 4 MG/2ML IJ SOLN
4.0000 mg | Freq: Four times a day (QID) | INTRAMUSCULAR | Status: DC | PRN
  Administered 2018-09-26 – 2018-09-27 (×4): 4 mg via INTRAVENOUS
  Filled 2018-09-26 (×5): qty 2

## 2018-09-26 MED ORDER — LIDOCAINE HCL 1 % IJ SOLN
INTRAMUSCULAR | Status: AC
  Filled 2018-09-26: qty 10

## 2018-09-26 MED ORDER — HYDROMORPHONE HCL 1 MG/ML IJ SOLN
1.0000 mg | INTRAMUSCULAR | Status: DC | PRN
  Administered 2018-09-26 – 2018-09-28 (×13): 1 mg via INTRAVENOUS
  Filled 2018-09-26 (×15): qty 1

## 2018-09-26 MED ORDER — SODIUM CHLORIDE 0.9 % IV SOLN
1.0000 g | INTRAVENOUS | Status: DC
  Administered 2018-09-26 (×2): 1 g via INTRAVENOUS
  Filled 2018-09-26 (×2): qty 10

## 2018-09-26 MED ORDER — MELATONIN 5 MG PO TABS
10.0000 mg | ORAL_TABLET | Freq: Every evening | ORAL | Status: DC | PRN
  Administered 2018-09-26: 10 mg via ORAL
  Filled 2018-09-26 (×2): qty 2

## 2018-09-26 MED ORDER — POTASSIUM CHLORIDE CRYS ER 20 MEQ PO TBCR
40.0000 meq | EXTENDED_RELEASE_TABLET | Freq: Once | ORAL | Status: AC
  Administered 2018-09-26: 40 meq via ORAL
  Filled 2018-09-26: qty 2

## 2018-09-26 MED ORDER — SERTRALINE HCL 50 MG PO TABS
50.0000 mg | ORAL_TABLET | Freq: Every day | ORAL | Status: DC
  Administered 2018-09-26 – 2018-09-28 (×3): 50 mg via ORAL
  Filled 2018-09-26 (×3): qty 1

## 2018-09-26 NOTE — Progress Notes (Signed)
[facility]                                                                              Patient Demographics  Jamie Allen, is a 47 y.o. female, DOB - 1971-03-02, KLK:[id number]  Admit date - 09/25/2018   Admitting Physician Colbert Ewing, MD  Outpatient Primary MD for the patient is Jeanella Anton, NP  Outpatient specialists:   LOS - 0  days    Chief Complaint  Patient presents with  . Abdominal Pain       Brief summary  Niquita Raelin Pixler is a 47 y.o. 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 CT (+) for ascites with CBD dilatation - GI consulted(Dr Henrene Pastor)  Still with pain, GI consult for poss ERCP. May need PRBC tx for anemia     Medications  Scheduled Meds: . folic acid  1 mg Oral Daily  . gabapentin  300 mg Oral TID  . lidocaine      . lipase/protease/amylase  72,000 Units Oral TID AC  . sertraline  50 mg Oral Daily  . spironolactone  50 mg Oral Daily   Continuous Infusions: . cefTRIAXone (ROCEPHIN)  IV 1 g (09/26/18 0100)   PRN Meds:.HYDROmorphone (DILAUDID) injection, Melatonin, ondansetron (ZOFRAN) IV, oxyCODONE   Antibiotics   Anti-infectives (From admission, onward)   Start     Dose/Rate Route Frequency Ordered Stop   09/26/18 0030  cefTRIAXone (ROCEPHIN) 1 g in sodium chloride 0.9 % 100 mL IVPB     1 g 200 mL/hr over 30 Minutes Intravenous Every 24 hours 09/26/18 0016          Subjective:   Shelby Peltz was seen and examined today.  Patient denies dizziness, chest pain, sob. Still with pain, though better  Objective:   Vitals:   09/26/18 0100 09/26/18 0155 09/26/18 0157 09/26/18 0510  BP: 118/61  (!) 122/48 (!) 102/52  Pulse: 70  72 82  Resp: 15  16 18   Temp:   97.9 F (36.6 C) 98.3 F (36.8 C)  TempSrc:   Oral Oral  SpO2: 99%  100% 96%  Weight:   55.4 kg    Height:  5' 1"  (1.549 m)      Intake/Output Summary (Last 24 hours) at 09/26/2018 1132 Last data filed at 09/26/2018 1017 Gross per 24 hour  Intake 0 ml  Output 410 ml  Net -410 ml     Wt Readings from Last 3 Encounters:  09/26/18 55.4 kg  09/06/18 52.8 kg  08/19/18 54.2 kg     Data Reviewed:  I have personally reviewed following labs and imaging studies  Micro Results No results found for this or any previous visit (from the past 240 hour(s)).  Radiology Reports Ct Abdomen Pelvis Wo Contrast  Result Date: 09/25/2018 CLINICAL DATA:  Abdominal distension EXAM: CT ABDOMEN AND PELVIS WITHOUT CONTRAST TECHNIQUE: Multidetector CT imaging of the abdomen and pelvis was performed following the standard protocol without IV contrast. COMPARISON:  CT 07/15/18 FINDINGS: Lower chest: Lung  bases are clear. Hepatobiliary: No focal hepatic lesion on noncontrast exam. Postcholecystectomy. Caudate lobe is enlarged. Interval increase in intraperitoneal free fluid along the margin RIGHT hepatic lobe. Common bile duct is prominent measuring 14 mm through the pancreatic head which is increased from 5 mm on comparison CT. No obstructing lesion identified. Pancreatic head appear normal on contrast CT 07/15/2018 Pancreas: Pancreas is normal. No ductal dilatation. No pancreatic inflammation. Spleen: Spleen is enlarged similar comparison exam spleen measures 18 cm in craniocaudad dimension Adrenals/urinary tract: Adrenal glands and kidneys are normal. The ureters and bladder normal. Stomach/Bowel: Stomach, small bowel, appendix, and cecum are normal. The colon and rectosigmoid colon are normal. Vascular/Lymphatic: Abdominal aorta is normal caliber. No periportal or retroperitoneal adenopathy. No pelvic adenopathy. Reproductive: Uterus and ovaries normal. Other: Moderate large volume intraperitoneal free fluid increased from comparison exam. Musculoskeletal: No aggressive osseous lesion. IMPRESSION: 1. New  moderate volume intraperitoneal free fluid. Presumably ascites from portal hypertension and cirrhosis. 2. New dilatation of the common hepatic duct and common bile duct through the pancreatic head. No obstructing lesion identified. No lesion within the pancreatic head on CT 07/15/2018. Unclear etiology of dilatation. Consider ERCP with elevated bilirubin. 3. Cirrhotic liver morphology and splenomegaly. Electronically Signed   By: Suzy Bouchard M.D.   On: 09/25/2018 22:46    Lab Data:  CBC: Recent Labs  Lab 09/25/18 1738 09/26/18 0447  WBC 8.3 5.6  NEUTROABS  --  3.6  HGB 7.9* 7.1*  HCT 25.4* 22.8*  MCV 116.5* 114.0*  PLT 70* 58*   Basic Metabolic Panel: Recent Labs  Lab 09/25/18 1738 09/25/18 1828 09/26/18 0447  NA 137  --  138  K 3.3*  --  3.6  CL 102  --  102  CO2 26  --  27  GLUCOSE 109*  --  100*  BUN 5*  --  5*  CREATININE 0.66  --  0.64  CALCIUM 9.3  --  8.7*  MG  --  2.0  --    GFR: Estimated Creatinine Clearance: 65.6 mL/min (by C-G formula based on SCr of 0.64 mg/dL). Liver Function Tests: Recent Labs  Lab 09/25/18 1738 09/26/18 0447  AST 59* 48*  ALT 25 22  ALKPHOS 145* 110  BILITOT 7.3* 5.9*  PROT 7.8 6.7  ALBUMIN 3.6 3.0*   Recent Labs  Lab 09/25/18 1738  LIPASE 28   No results for input(s): AMMONIA in the last 168 hours. Coagulation Profile: Recent Labs  Lab 09/25/18 1813 09/26/18 0447  INR 1.33 1.44   Cardiac Enzymes: No results for input(s): CKTOTAL, CKMB, CKMBINDEX, TROPONINI in the last 168 hours. BNP (last 3 results) No results for input(s): PROBNP in the last 8760 hours. HbA1C: No results for input(s): HGBA1C in the last 72 hours. CBG: No results for input(s): GLUCAP in the last 168 hours. Lipid Profile: No results for input(s): CHOL, HDL, LDLCALC, TRIG, CHOLHDL, LDLDIRECT in the last 72 hours. Thyroid Function Tests: No results for input(s): TSH, T4TOTAL, FREET4, T3FREE, THYROIDAB in the last 72 hours. Anemia Panel: No  results for input(s): VITAMINB12, FOLATE, FERRITIN, TIBC, IRON, RETICCTPCT in the last 72 hours. Urine analysis:    Component Value Date/Time   COLORURINE AMBER (A) 09/25/2018 2300   APPEARANCEUR HAZY (A) 09/25/2018 2300   APPEARANCEUR Clear 02/04/2018 1118   LABSPEC 1.017 09/25/2018 2300   PHURINE 7.0 09/25/2018 2300   GLUCOSEU NEGATIVE 09/25/2018 2300   HGBUR NEGATIVE 09/25/2018 2300   BILIRUBINUR SMALL (A) 09/25/2018 2300   BILIRUBINUR  Negative 02/04/2018 1118   KETONESUR NEGATIVE 09/25/2018 2300   PROTEINUR NEGATIVE 09/25/2018 2300   UROBILINOGEN 0.2 05/19/2016 1638   UROBILINOGEN 0.2 08/17/2015 1853   NITRITE NEGATIVE 09/25/2018 2300   LEUKOCYTESUR TRACE (A) 09/25/2018 2300   LEUKOCYTESUR Negative 02/04/2018 1118     Benito Mccreedy M.D. [facility] 09/26/2018, 11:32 AM  Pager: [phone] Between 7am to 7pm - call Pager - [phone]  After 7pm go to [email or web address] - password [email or web address]  Call night coverage person covering after 7pm

## 2018-09-26 NOTE — ED Notes (Signed)
Patient vomited x1 and states that she has a difficult time keeping potassium down because it is so big. Provided patient with clean emesis bag. Patient ambulated to restroom with minimal assistance.
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
## 2018-09-26 NOTE — Procedures (Signed)
PROCEDURE SUMMARY:  Successful image-guided paracentesis from the left lower abdomen.  Yielded 1.5 liters of clear yellow fluid.  No immediate complications.  Patient tolerated well.   Specimen was sent for labs.  Please see imaging section of Epic for full dictation.  Joaquim Nam PA-C 09/26/2018 3:07 PM
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
## 2018-09-27 ENCOUNTER — Inpatient Hospital Stay (HOSPITAL_COMMUNITY): Payer: Medicaid Other

## 2018-09-27 DIAGNOSIS — D649 Anemia, unspecified: Secondary | ICD-10-CM

## 2018-09-27 DIAGNOSIS — K838 Other specified diseases of biliary tract: Secondary | ICD-10-CM

## 2018-09-27 DIAGNOSIS — R1084 Generalized abdominal pain: Secondary | ICD-10-CM

## 2018-09-27 DIAGNOSIS — R945 Abnormal results of liver function studies: Secondary | ICD-10-CM

## 2018-09-27 DIAGNOSIS — K7031 Alcoholic cirrhosis of liver with ascites: Principal | ICD-10-CM

## 2018-09-27 DIAGNOSIS — D696 Thrombocytopenia, unspecified: Secondary | ICD-10-CM

## 2018-09-27 LAB — CBC WITH DIFFERENTIAL/PLATELET
Abs Immature Granulocytes: 0.12 10*3/uL — ABNORMAL HIGH (ref 0.00–0.07)
BASOS ABS: 0 10*3/uL (ref 0.0–0.1)
BASOS PCT: 1 %
Eosinophils Absolute: 1.2 10*3/uL — ABNORMAL HIGH (ref 0.0–0.5)
Eosinophils Relative: 19 %
HCT: 21.7 % — ABNORMAL LOW (ref 36.0–46.0)
Hemoglobin: 6.8 g/dL — CL (ref 12.0–15.0)
Immature Granulocytes: 2 %
Lymphocytes Relative: 14 %
Lymphs Abs: 0.9 10*3/uL (ref 0.7–4.0)
MCH: 36.8 pg — ABNORMAL HIGH (ref 26.0–34.0)
MCHC: 31.3 g/dL (ref 30.0–36.0)
MCV: 117.3 fL — ABNORMAL HIGH (ref 80.0–100.0)
Monocytes Absolute: 0.3 10*3/uL (ref 0.1–1.0)
Monocytes Relative: 5 %
NRBC: 0 % (ref 0.0–0.2)
Neutro Abs: 3.8 10*3/uL (ref 1.7–7.7)
Neutrophils Relative %: 59 %
Platelets: 50 10*3/uL — ABNORMAL LOW (ref 150–400)
RBC: 1.85 MIL/uL — ABNORMAL LOW (ref 3.87–5.11)
RDW: 17 % — ABNORMAL HIGH (ref 11.5–15.5)
WBC: 6.4 10*3/uL (ref 4.0–10.5)

## 2018-09-27 LAB — COMPREHENSIVE METABOLIC PANEL
ALK PHOS: 117 U/L (ref 38–126)
ALT: 21 U/L (ref 0–44)
ANION GAP: 7 (ref 5–15)
AST: 47 U/L — ABNORMAL HIGH (ref 15–41)
Albumin: 3.1 g/dL — ABNORMAL LOW (ref 3.5–5.0)
BUN: 5 mg/dL — ABNORMAL LOW (ref 6–20)
CALCIUM: 8.8 mg/dL — AB (ref 8.9–10.3)
CO2: 25 mmol/L (ref 22–32)
Chloride: 104 mmol/L (ref 98–111)
Creatinine, Ser: 0.63 mg/dL (ref 0.44–1.00)
GFR calc Af Amer: >60 mL/min (ref >60)
GFR calc non Af Amer: >60 mL/min (ref >60)
Glucose, Bld: 94 mg/dL (ref 70–99)
Potassium: 3.8 mmol/L (ref 3.5–5.1)
SODIUM: 136 mmol/L (ref 135–145)
TOTAL PROTEIN: 6.4 g/dL — AB (ref 6.5–8.1)
Total Bilirubin: 5.4 mg/dL — ABNORMAL HIGH (ref 0.3–1.2)

## 2018-09-27 LAB — MAGNESIUM: Magnesium: 2 mg/dL (ref 1.7–2.4)

## 2018-09-27 LAB — PREPARE RBC (CROSSMATCH)

## 2018-09-27 MED ORDER — FUROSEMIDE 40 MG PO TABS
40.0000 mg | ORAL_TABLET | Freq: Every day | ORAL | Status: DC
  Administered 2018-09-27 – 2018-09-28 (×2): 40 mg via ORAL
  Filled 2018-09-27 (×2): qty 1

## 2018-09-27 MED ORDER — SODIUM CHLORIDE 0.9% IV SOLUTION: Freq: Once | INTRAVENOUS | Status: DC

## 2018-09-27 MED ORDER — GADOBUTROL 1 MMOL/ML IV SOLN
7.5000 mL | Freq: Once | INTRAVENOUS | Status: AC | PRN
  Administered 2018-09-27: 6 mL via INTRAVENOUS

## 2018-09-27 NOTE — Plan of Care (Signed)

## 2018-09-27 NOTE — Progress Notes (Signed)
Critical result called to floor- Hg 6.8 Text message sent to Dr. Silas [place] to make him aware. Patient asymptomatic and was scheduled for an outpatient transfusion on the 13th of this month for chronic anemia

## 2018-09-27 NOTE — Progress Notes (Signed)
Patient ID: Jamie Allen, female   DOB: 1971/03/11, 47 y.o.   MRN: [id number]  PROGRESS NOTE    Jamie Allen  KCM:[id number] DOB: 28-Jun-1971 DOA: 09/25/2018 PCP: Jeanella Anton, NP   Brief Narrative:  47 year old female with history of cirrhosis secondary to alcohol use along with Jamie Allen, cholecystitis status post cholecystectomy, chronic anemia, thrombocytopenia presented with progressive abdominal distention and pain.  CT was positive for ascites with CBD dilatation.  GI was consulted.  Patient underwent paracentesis on 09/26/2018.   Assessment & Plan:   Active Problems:   Ascites   Common bile duct dilatation  Decompensated cirrhosis of liver with ascites with dilated CBD along with abnormal LFTs including bilirubin along with chronic anemia and thrombocytopenia -Status post paracentesis and removal of 1.5 L fluid on 09/26/2018.  Peritoneal fluid not consistent with SBP -Has been started on spironolactone.  Might need to start Lasix as well.  Awaiting GI recommendations -Follow LFTs.  Will follow GI recommendations regarding dilated CBD and need for MRCP versus ERCP -Hemoglobin is 6.8 today.  Will transfuse 1 unit of packed red cells.  Platelets 50 today.  Monitor  Hypokalemia -Improved  Suspected chronic pancreatic insufficiency -Continue Creon  Depression -Continue Zoloft  Chronic pain and peripheral neuropathy -Continue gabapentin.  Also on PRN oxycodone   DVT prophylaxis: SCDs only given low platelets Code Status: Full Family Communication: None at bedside Disposition Plan: Depends on clinical outcome and GI evaluation  Consultants: GI  Procedures: None  Antimicrobials: Rocephin from 09/25/2018 onwards   Subjective: Patient seen and examined at bedside.  She denies worsening abdominal pain.  No overnight fever or vomiting.   Objective: Vitals:   09/26/18 1443 09/26/18 1459 09/26/18 1509 09/26/18 2159  BP: (!) 116/44 (!) 121/55 (!) 121/55 (!)  114/45  Pulse:    65  Resp:    14  Temp:    98.4 F (36.9 C)  TempSrc:    Oral  SpO2:    97%  Weight:      Height:        Intake/Output Summary (Last 24 hours) at 09/27/2018 1102 Last data filed at 09/27/2018 0912 Gross per 24 hour  Intake 440 ml  Output 825 ml  Net -385 ml   Filed Weights   09/26/18 0155  Weight: 55.4 kg    Examination:  General exam: Appears calm and comfortable, no distress Respiratory system: Bilateral decreased breath sounds at bases, no wheezing Cardiovascular system: S1 & S2 heard, Rate controlled Gastrointestinal system: Abdomen is nondistended, soft and nontender. Normal bowel sounds heard. Extremities: No cyanosis, clubbing; trace edema   Data Reviewed: I have personally reviewed following labs and imaging studies  CBC: Recent Labs  Lab 09/25/18 1738 09/26/18 0447 09/27/18 0356  WBC 8.3 5.6 6.4  NEUTROABS  --  3.6 3.8  HGB 7.9* 7.1* 6.8*  HCT 25.4* 22.8* 21.7*  MCV 116.5* 114.0* 117.3*  PLT 70* 58* 50*   Basic Metabolic Panel: Recent Labs  Lab 09/25/18 1738 09/25/18 1828 09/26/18 0447 09/27/18 0356  NA 137  --  138 136  K 3.3*  --  3.6 3.8  CL 102  --  102 104  CO2 26  --  27 25  GLUCOSE 109*  --  100* 94  BUN 5*  --  5* 5*  CREATININE 0.66  --  0.64 0.63  CALCIUM 9.3  --  8.7* 8.8*  MG  --  2.0  --  2.0   GFR: Estimated Creatinine Clearance:  65.6 mL/min (by C-G formula based on SCr of 0.63 mg/dL). Liver Function Tests: Recent Labs  Lab 09/25/18 1738 09/26/18 0447 09/27/18 0356  AST 59* 48* 47*  ALT 25 22 21   ALKPHOS 145* 110 117  BILITOT 7.3* 5.9* 5.4*  PROT 7.8 6.7 6.4*  ALBUMIN 3.6 3.0* 3.1*   Recent Labs  Lab 09/25/18 1738  LIPASE 28   No results for input(s): AMMONIA in the last 168 hours. Coagulation Profile: Recent Labs  Lab 09/25/18 1813 09/26/18 0447  INR 1.33 1.44   Cardiac Enzymes: No results for input(s): CKTOTAL, CKMB, CKMBINDEX, TROPONINI in the last 168 hours. BNP (last 3 results) No  results for input(s): PROBNP in the last 8760 hours. HbA1C: No results for input(s): HGBA1C in the last 72 hours. CBG: No results for input(s): GLUCAP in the last 168 hours. Lipid Profile: No results for input(s): CHOL, HDL, LDLCALC, TRIG, CHOLHDL, LDLDIRECT in the last 72 hours. Thyroid Function Tests: No results for input(s): TSH, T4TOTAL, FREET4, T3FREE, THYROIDAB in the last 72 hours. Anemia Panel: No results for input(s): VITAMINB12, FOLATE, FERRITIN, TIBC, IRON, RETICCTPCT in the last 72 hours. Sepsis Labs: No results for input(s): PROCALCITON, LATICACIDVEN in the last 168 hours.  No results found for this or any previous visit (from the past 240 hour(s)).       Radiology Studies: Ct Abdomen Pelvis Wo Contrast  Result Date: 09/25/2018 CLINICAL DATA:  Abdominal distension EXAM: CT ABDOMEN AND PELVIS WITHOUT CONTRAST TECHNIQUE: Multidetector CT imaging of the abdomen and pelvis was performed following the standard protocol without IV contrast. COMPARISON:  CT 07/15/18 FINDINGS: Lower chest: Lung bases are clear. Hepatobiliary: No focal hepatic lesion on noncontrast exam. Postcholecystectomy. Caudate lobe is enlarged. Interval increase in intraperitoneal free fluid along the margin RIGHT hepatic lobe. Common bile duct is prominent measuring 14 mm through the pancreatic head which is increased from 5 mm on comparison CT. No obstructing lesion identified. Pancreatic head appear normal on contrast CT 07/15/2018 Pancreas: Pancreas is normal. No ductal dilatation. No pancreatic inflammation. Spleen: Spleen is enlarged similar comparison exam spleen measures 18 cm in craniocaudad dimension Adrenals/urinary tract: Adrenal glands and kidneys are normal. The ureters and bladder normal. Stomach/Bowel: Stomach, small bowel, appendix, and cecum are normal. The colon and rectosigmoid colon are normal. Vascular/Lymphatic: Abdominal aorta is normal caliber. No periportal or retroperitoneal adenopathy. No  pelvic adenopathy. Reproductive: Uterus and ovaries normal. Other: Moderate large volume intraperitoneal free fluid increased from comparison exam. Musculoskeletal: No aggressive osseous lesion. IMPRESSION: 1. New moderate volume intraperitoneal free fluid. Presumably ascites from portal hypertension and cirrhosis. 2. New dilatation of the common hepatic duct and common bile duct through the pancreatic head. No obstructing lesion identified. No lesion within the pancreatic head on CT 07/15/2018. Unclear etiology of dilatation. Consider ERCP with elevated bilirubin. 3. Cirrhotic liver morphology and splenomegaly. Electronically Signed   By: Suzy Bouchard M.D.   On: 09/25/2018 22:46   US Paracentesis  Result Date: 09/26/2018 INDICATION: Patient with history of ETOH + NASH cirrhosis who presented to [facility] ED due to abdominal pain, distention and nausea. Request has been made for diagnostic and therapeutic paracentesis. EXAM: ULTRASOUND GUIDED DIAGNOSTIC AND THERAPEUTIC PARACENTESIS MEDICATIONS: 10 ml 1% lidocaine COMPLICATIONS: None immediate. PROCEDURE: Informed written consent was obtained from the patient after a discussion of the risks, benefits and alternatives to treatment. A timeout was performed prior to the initiation of the procedure. Initial ultrasound scanning demonstrates a moderate amount of ascites within the left lower abdominal  quadrant. The left lower abdomen was prepped and draped in the usual sterile fashion. 1% lidocaine was used for local anesthesia. Following this, a 6 Fr Safe-T-Centesis catheter was introduced. An ultrasound image was saved for documentation purposes. The paracentesis was performed. The catheter was removed and a dressing was applied. The patient tolerated the procedure well without immediate post procedural complication. FINDINGS: A total of approximately 1.5 L of clear yellow fluid was removed. Samples were sent to the laboratory as requested by the clinical team.  IMPRESSION: Successful ultrasound-guided paracentesis yielding 1.5 liters of peritoneal fluid. Read by Candiss Norse, PA-C Electronically Signed   By: Aletta Edouard M.D.   On: 09/26/2018 15:12   US Abdomen Limited Ruq  Result Date: 09/26/2018 CLINICAL DATA:  Cirrhosis and ascites on CT scan. Biliary dilatation. EXAM: ULTRASOUND ABDOMEN LIMITED RIGHT UPPER QUADRANT COMPARISON:  CT scan 09/25/2018. FINDINGS: Gallbladder: Surgically absent. Common bile duct: Diameter: Dilated to 10.4 mm diameter. Liver: Heterogeneous echotexture with subtle nodularity of contour. No focal intraparenchymal abnormality. Portal vein is patent on color Doppler imaging with normal direction of blood flow towards the liver. IMPRESSION: Similar appearance of common duct dilatation, now measuring 10 mm. Heterogeneous liver parenchyma with nodular hepatic contour, features suggesting cirrhosis. Electronically Signed   By: Misty Stanley M.D.   On: 09/26/2018 15:59        Scheduled Meds: . sodium chloride   Intravenous Once  . folic acid  1 mg Oral Daily  . gabapentin  300 mg Oral TID  . lipase/protease/amylase  72,000 Units Oral TID AC  . sertraline  50 mg Oral Daily  . spironolactone  50 mg Oral Daily   Continuous Infusions: . cefTRIAXone (ROCEPHIN)  IV 1 g (09/26/18 2359)     LOS: 1 day        Aline August, MD [facility] Pager [phone]  If 7PM-7AM, please contact night-coverage [email or web address] Password [email or web address] 09/27/2018, 11:02 AM
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
## 2018-09-27 NOTE — Progress Notes (Signed)
Initial Nutrition Assessment  INTERVENTION:   Diet advancement per MD Once diet advanced, provide Boost Breeze po TID, each supplement provides 250 kcal and 9 grams of protein   NUTRITION DIAGNOSIS:   Inadequate oral intake related to nausea, vomiting(abdominal pain) as evidenced by per patient/family report, NPO status.  GOAL:   Patient will meet greater than or equal to 90% of their needs  MONITOR:   Diet advancement, Labs, Weight trends, I & O's  REASON FOR ASSESSMENT:   Malnutrition Screening Tool   ASSESSMENT:   47 year old female with history of cirrhosis secondary to alcohol use along with [name], cholecystitis status post cholecystectomy, chronic anemia, thrombocytopenia presented with progressive abdominal distention and pain.  CT was positive for ascites with CBD dilatation.  GI was consulted.  Patient underwent paracentesis on 09/26/2018.  Patient currently NPO for MRCP today. Pt reports abdominal pain began after Thanksgiving and within the past few days she developed N/V. Pt with ascites and is s/p paracentesis 12/8 which yielded 1.5L.  Per GI note, pt has not drank ETOH for a year. Pt has a history of zinc deficiency.  Pt with history of liking clear liquid supplements, will order Boost Breeze once diet is advanced.  Per weight records, pt has lost 12 lb since 4/6 (9% wt loss x 8 months, insignificant for time frame). Fluid may be masking true weight loss.   Labs reviewed. Medications: Folic acid tablet daily, Lasix tablet daily, Creon capsule TID, IV Zofran PRN  NUTRITION - FOCUSED PHYSICAL EXAM:  Nutrition focused physical exam shows no sign of depletion of muscle mass or body fat.  Diet Order:   Diet Order            Diet NPO time specified Except for: Sips with Meds  Diet effective midnight              EDUCATION NEEDS:   No education needs have been identified at this time  Skin:  Skin Assessment: Reviewed RN Assessment  Last BM:  12/8  Height:    Ht Readings from Last 1 Encounters:  09/26/18 5\' 1"  (1.549 m)    Weight:   Wt Readings from Last 1 Encounters:  09/26/18 55.4 kg    Ideal Body Weight:  47.7 kg  BMI:  Body mass index is 23.08 kg/m.  Estimated Nutritional Needs:   Kcal:  1650-1850  Protein:  75-85g  Fluid:  1.6L/day  Jamie Bibles, MS, RD, LDN [place] Diet[facility] Pager: [phone] After Hours Pager: [phone]

## 2018-09-28 ENCOUNTER — Telehealth: Payer: Self-pay

## 2018-09-28 ENCOUNTER — Other Ambulatory Visit: Payer: Self-pay

## 2018-09-28 DIAGNOSIS — Z01818 Encounter for other preprocedural examination: Secondary | ICD-10-CM

## 2018-09-28 DIAGNOSIS — D638 Anemia in other chronic diseases classified elsewhere: Secondary | ICD-10-CM

## 2018-09-28 DIAGNOSIS — K7030 Alcoholic cirrhosis of liver without ascites: Secondary | ICD-10-CM

## 2018-09-28 LAB — CBC WITH DIFFERENTIAL/PLATELET
Abs Immature Granulocytes: 0.13 10*3/uL — ABNORMAL HIGH (ref 0.00–0.07)
Basophils Absolute: 0.1 10*3/uL (ref 0.0–0.1)
Basophils Relative: 1 %
Eosinophils Absolute: 1.4 10*3/uL — ABNORMAL HIGH (ref 0.0–0.5)
Eosinophils Relative: 19 %
HCT: 26.6 % — ABNORMAL LOW (ref 36.0–46.0)
Hemoglobin: 8.7 g/dL — ABNORMAL LOW (ref 12.0–15.0)
Immature Granulocytes: 2 %
Lymphocytes Relative: 13 %
Lymphs Abs: 1 10*3/uL (ref 0.7–4.0)
MCH: 34.9 pg — ABNORMAL HIGH (ref 26.0–34.0)
MCHC: 32.7 g/dL (ref 30.0–36.0)
MCV: 106.8 fL — ABNORMAL HIGH (ref 80.0–100.0)
MONOS PCT: 4 %
Monocytes Absolute: 0.3 10*3/uL (ref 0.1–1.0)
Neutro Abs: 4.6 10*3/uL (ref 1.7–7.7)
Neutrophils Relative %: 61 %
Platelets: 66 10*3/uL — ABNORMAL LOW (ref 150–400)
RBC: 2.49 MIL/uL — ABNORMAL LOW (ref 3.87–5.11)
RDW: 22.1 % — ABNORMAL HIGH (ref 11.5–15.5)
WBC: 7.5 10*3/uL (ref 4.0–10.5)
nRBC: 0.3 % — ABNORMAL HIGH (ref 0.0–0.2)

## 2018-09-28 LAB — BPAM RBC
Blood Product Expiration Date: 201912242359
ISSUE DATE / TIME: 201912091215
Unit Type and Rh: 9500

## 2018-09-28 LAB — TYPE AND SCREEN
ABO/RH(D): O NEG
Antibody Screen: POSITIVE
Donor AG Type: NEGATIVE
Unit division: 0

## 2018-09-28 LAB — COMPREHENSIVE METABOLIC PANEL
ALT: 21 U/L (ref 0–44)
AST: 45 U/L — ABNORMAL HIGH (ref 15–41)
Albumin: 3.2 g/dL — ABNORMAL LOW (ref 3.5–5.0)
Alkaline Phosphatase: 125 U/L (ref 38–126)
Anion gap: 11 (ref 5–15)
BUN: 7 mg/dL (ref 6–20)
CO2: 27 mmol/L (ref 22–32)
Calcium: 8.9 mg/dL (ref 8.9–10.3)
Chloride: 99 mmol/L (ref 98–111)
Creatinine, Ser: 0.69 mg/dL (ref 0.44–1.00)
GFR calc non Af Amer: >60 mL/min (ref >60)
Glucose, Bld: 89 mg/dL (ref 70–99)
Potassium: 3.4 mmol/L — ABNORMAL LOW (ref 3.5–5.1)
Sodium: 137 mmol/L (ref 135–145)
Total Bilirubin: 6.2 mg/dL — ABNORMAL HIGH (ref 0.3–1.2)
Total Protein: 7.2 g/dL (ref 6.5–8.1)

## 2018-09-28 LAB — MAGNESIUM: Magnesium: 1.7 mg/dL (ref 1.7–2.4)

## 2018-09-28 MED ORDER — SPIRONOLACTONE 100 MG PO TABS: 100.0000 mg | ORAL_TABLET | Freq: Every day | ORAL | 0 refills | Status: AC

## 2018-09-28 MED ORDER — OXYCODONE HCL 5 MG PO TABS: 5.0000 mg | ORAL_TABLET | Freq: Four times a day (QID) | ORAL | 0 refills | Status: DC | PRN

## 2018-09-28 MED ORDER — POTASSIUM CHLORIDE CRYS ER 20 MEQ PO TBCR
40.0000 meq | EXTENDED_RELEASE_TABLET | Freq: Once | ORAL | Status: AC
  Administered 2018-09-28: 40 meq via ORAL
  Filled 2018-09-28: qty 2

## 2018-09-28 MED ORDER — SPIRONOLACTONE 100 MG PO TABS: 100.0000 mg | ORAL_TABLET | Freq: Every day | ORAL | Status: DC

## 2018-09-28 MED ORDER — FUROSEMIDE 40 MG PO TABS: 40.0000 mg | ORAL_TABLET | Freq: Every day | ORAL | 0 refills | Status: AC

## 2018-09-28 NOTE — Progress Notes (Addendum)
[facility] Progress Note  CC:  Cirrhosis  Subjective:  Feels better.  Would like to go home.  Still has some pain but it is better since having paracentesis on 10/8.  MRI abdomen/MRCP as follows:  IMPRESSION: 1. Hepatic cirrhosis. Marked splenomegaly with splenorenal shunting noted. No splenic vein thrombosis. 2. Notable third spacing of fluid with ascites, subcutaneous edema, mesenteric edema. 3. Scarring in the left kidney upper pole. 4. Extrahepatic biliary dilatation with the CBD a 1.0 cm. Much of this may be a physiologic response to cholecystectomy. I do not see a definite filling defect or cause for obstruction of the CBD. The patient has known elevated total bilirubin. Assessment of direct and indirect bilirubin levels may help in further assessment of cause. Clinically warranted, a nuclear medicine hepatobiliary scan can assess for hepatocellular dysfunction. 5. 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 6.  Aortic Atherosclerosis (ICD10-I70.0). 7. Please note that on today's exam, an unusual boundary artifact obscures the upper half of the liver on the post-contrast axial sequences. The cause of this artifact is uncertain in this case, but given that this is a technical issue we are happy to repeat the scan and contrast bolus to reassess the upper portion of the liver. Sensitivity for certain types of lesions in the upper liver may be reduced due to this artifact.  Objective:  Vital signs in last 24 hours: Temp:  [98.3 F (36.8 C)-98.6 F (37 C)] 98.3 F (36.8 C) (12/10 0511) Pulse Rate:  [62-73] 73 (12/10 0511) Resp:  [14-17] 17 (12/10 0511) BP: (103-121)/(39-50) 114/50 (12/10 0511) SpO2:  [95 %-99 %] 95 % (12/10 0511) Last BM Date: 09/27/18 General:  Alert, Well-developed, in NAD; appears older than stated age Heart:  Regular rate and rhythm; no murmurs Pulm:  CTAB.  No increased WOB. Abdomen:  Soft, somewhat distended  from ascites fluid.  BS present.  Mild diffuse TTP, more so on the left side. Extremities:  Without edema. Neurologic:  Alert and oriented x 4;  grossly normal neurologically. Psych:  Alert and cooperative. Normal mood and affect.  Intake/Output from previous day: 12/09 0701 - 12/10 0700 In: 1339 [P.O.:590; Blood:650; IV Piggyback:99] Out: 1150 [Urine:1150]  Lab Results: Recent Labs    09/26/18 0447 09/27/18 0356 09/28/18 0331  WBC 5.6 6.4 7.5  HGB 7.1* 6.8* 8.7*  HCT 22.8* 21.7* 26.6*  PLT 58* 50* 66*   BMET Recent Labs    09/26/18 0447 09/27/18 0356 09/28/18 0331  NA 138 136 137  K 3.6 3.8 3.4*  CL 102 104 99  CO2 27 25 27   GLUCOSE 100* 94 89  BUN 5* 5* 7  CREATININE 0.64 0.63 0.69  CALCIUM 8.7* 8.8* 8.9   LFT Recent Labs    09/28/18 0331  PROT 7.2  ALBUMIN 3.2*  AST 45*  ALT 21  ALKPHOS 125  BILITOT 6.2*   PT/INR Recent Labs    09/25/18 1813 09/26/18 0447  LABPROT 16.4* 17.4*  INR 1.33 1.44   Mr 3d Recon At Scanner  Result Date: 12020/04/3018 CLINICAL DATA:  Abnormal liver function tests. Ascites. Dilated extrahepatic biliary system. EXAM: MRI ABDOMEN WITHOUT AND WITH CONTRAST (INCLUDING MRCP) TECHNIQUE: Multiplanar multisequence MR imaging of the abdomen was performed both before and after the administration of intravenous contrast. Heavily T2-weighted images of the biliary and pancreatic ducts were obtained, and three-dimensional MRCP images were rendered by post processing. CONTRAST:  6 cc Gadavist COMPARISON:  Multiple exams, including 09/25/2018 CT and  09/26/2018 ultrasound FINDINGS: Lower chest: Upper normal heart size. Hepatobiliary: Hepatic morphology and lobularity favor cirrhosis. Prior cholecystectomy. Extrahepatic biliary dilatation noted with the common hepatic duct 1.2 cm in the common bile duct at 1.0 cm, with distal tapering of the CBD in the vicinity of the ampulla and no obvious filling defect. Very minimal blunting and lack of conical  tapering of the distal CBD for example on image 23/12. Unfortunately on all post-contrast series, the top half of the liver is severely obscured by some sort of boundary artifact causing an iris type of affect with abnormal low signal intensity bands through the liver, and much of the liver completely obscured. As result, sensitivity and specificity for lesions in the upper liver including hepatocellular carcinoma is markedly reduced. We are happy to repeat the exam given this unexpected and unusual artifact causing severe limitation. None of the other sequences were affected by this artifact, only the postcontrast axials. The post-contrast coronal images were not affected. Pancreas: Unremarkable. No pancreatic mass is observed. Dorsal pancreatic duct unremarkable. Spleen: The spleen measures 19.0 by 13.9 by 7.4 cm (volume = 1000 cm^3). No focal splenic lesion identified. Adrenals/Urinary Tract: Adrenal glands unremarkable. Left renal atrophy. Scarring of the left kidney upper pole. Stomach/Bowel: No appreciable ampullary mass. Vascular/Lymphatic: The splenic vein is patent. 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 Aortoiliac atherosclerotic vascular disease. Other:  Diffuse ascites.  Subcutaneous and mesenteric edema. Musculoskeletal: Unremarkable IMPRESSION: 1. Hepatic cirrhosis. Marked splenomegaly with splenorenal shunting noted. No splenic vein thrombosis. 2. Notable third spacing of fluid with ascites, subcutaneous edema, mesenteric edema. 3. Scarring in the left kidney upper pole. 4. Extrahepatic biliary dilatation with the CBD a 1.0 cm. Much of this may be a physiologic response to cholecystectomy. I do not see a definite filling defect or cause for obstruction of the CBD. The patient has known elevated total bilirubin. Assessment of direct and indirect bilirubin levels may help in further assessment of cause. Clinically warranted, a nuclear medicine hepatobiliary scan can  assess for hepatocellular dysfunction. 5. 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 6.  Aortic Atherosclerosis (ICD10-I70.0). 7. Please note that on today's exam, an unusual boundary artifact obscures the upper half of the liver on the post-contrast axial sequences. The cause of this artifact is uncertain in this case, but given that this is a technical issue we are happy to repeat the scan and contrast bolus to reassess the upper portion of the liver. Sensitivity for certain types of lesions in the upper liver may be reduced due to this artifact. Electronically Signed   By: Van Clines M.D.   On: 107-20-2019 07:52   US Paracentesis  Result Date: 09/26/2018 INDICATION: Patient with history of ETOH + NASH cirrhosis who presented to [facility] ED due to abdominal pain, distention and nausea. Request has been made for diagnostic and therapeutic paracentesis. EXAM: ULTRASOUND GUIDED DIAGNOSTIC AND THERAPEUTIC PARACENTESIS MEDICATIONS: 10 ml 1% lidocaine COMPLICATIONS: None immediate. PROCEDURE: Informed written consent was obtained from the patient after a discussion of the risks, benefits and alternatives to treatment. A timeout was performed prior to the initiation of the procedure. Initial ultrasound scanning demonstrates a moderate amount of ascites within the left lower abdominal quadrant. The left lower abdomen was prepped and draped in the usual sterile fashion. 1% lidocaine was used for local anesthesia. Following this, a 6 Fr Safe-T-Centesis catheter was introduced. An ultrasound image was saved for documentation purposes. The paracentesis was performed. The catheter was removed  and a dressing was applied. The patient tolerated the procedure well without immediate post procedural complication. FINDINGS: A total of approximately 1.5 L of clear yellow fluid was removed. Samples were sent to the laboratory as requested by the clinical team. IMPRESSION: Successful ultrasound-guided  paracentesis yielding 1.5 liters of peritoneal fluid. Read by Candiss Norse, PA-C Electronically Signed   By: Aletta Edouard M.D.   On: 09/26/2018 15:12   Mr Abdomen Mrcp Moise Boring Contast  Result Date: 112-Jul-202019 CLINICAL DATA:  Abnormal liver function tests. Ascites. Dilated extrahepatic biliary system. EXAM: MRI ABDOMEN WITHOUT AND WITH CONTRAST (INCLUDING MRCP) TECHNIQUE: Multiplanar multisequence MR imaging of the abdomen was performed both before and after the administration of intravenous contrast. Heavily T2-weighted images of the biliary and pancreatic ducts were obtained, and three-dimensional MRCP images were rendered by post processing. CONTRAST:  6 cc Gadavist COMPARISON:  Multiple exams, including 09/25/2018 CT and 09/26/2018 ultrasound FINDINGS: Lower chest: Upper normal heart size. Hepatobiliary: Hepatic morphology and lobularity favor cirrhosis. Prior cholecystectomy. Extrahepatic biliary dilatation noted with the common hepatic duct 1.2 cm in the common bile duct at 1.0 cm, with distal tapering of the CBD in the vicinity of the ampulla and no obvious filling defect. Very minimal blunting and lack of conical tapering of the distal CBD for example on image 23/12. Unfortunately on all post-contrast series, the top half of the liver is severely obscured by some sort of boundary artifact causing an iris type of affect with abnormal low signal intensity bands through the liver, and much of the liver completely obscured. As result, sensitivity and specificity for lesions in the upper liver including hepatocellular carcinoma is markedly reduced. We are happy to repeat the exam given this unexpected and unusual artifact causing severe limitation. None of the other sequences were affected by this artifact, only the postcontrast axials. The post-contrast coronal images were not affected. Pancreas: Unremarkable. No pancreatic mass is observed. Dorsal pancreatic duct unremarkable. Spleen: The spleen  measures 19.0 by 13.9 by 7.4 cm (volume = 1000 cm^3). No focal splenic lesion identified. Adrenals/Urinary Tract: Adrenal glands unremarkable. Left renal atrophy. Scarring of the left kidney upper pole. Stomach/Bowel: No appreciable ampullary mass. Vascular/Lymphatic: The splenic vein is patent. 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 Aortoiliac atherosclerotic vascular disease. Other:  Diffuse ascites.  Subcutaneous and mesenteric edema. Musculoskeletal: Unremarkable IMPRESSION: 1. Hepatic cirrhosis. Marked splenomegaly with splenorenal shunting noted. No splenic vein thrombosis. 2. Notable third spacing of fluid with ascites, subcutaneous edema, mesenteric edema. 3. Scarring in the left kidney upper pole. 4. Extrahepatic biliary dilatation with the CBD a 1.0 cm. Much of this may be a physiologic response to cholecystectomy. I do not see a definite filling defect or cause for obstruction of the CBD. The patient has known elevated total bilirubin. Assessment of direct and indirect bilirubin levels may help in further assessment of cause. Clinically warranted, a nuclear medicine hepatobiliary scan can assess for hepatocellular dysfunction. 5. 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 6.  Aortic Atherosclerosis (ICD10-I70.0). 7. Please note that on today's exam, an unusual boundary artifact obscures the upper half of the liver on the post-contrast axial sequences. The cause of this artifact is uncertain in this case, but given that this is a technical issue we are happy to repeat the scan and contrast bolus to reassess the upper portion of the liver. Sensitivity for certain types of lesions in the upper liver may be reduced due to this artifact. Electronically Signed  By: Van Clines M.D.   On: 130-Jul-202019 07:52   US Abdomen Limited Ruq  Result Date: 09/26/2018 CLINICAL DATA:  Cirrhosis and ascites on CT scan. Biliary dilatation. EXAM: ULTRASOUND  ABDOMEN LIMITED RIGHT UPPER QUADRANT COMPARISON:  CT scan 09/25/2018. FINDINGS: Gallbladder: Surgically absent. Common bile duct: Diameter: Dilated to 10.4 mm diameter. Liver: Heterogeneous echotexture with subtle nodularity of contour. No focal intraparenchymal abnormality. Portal vein is patent on color Doppler imaging with normal direction of blood flow towards the liver. IMPRESSION: Similar appearance of common duct dilatation, now measuring 10 mm. Heterogeneous liver parenchyma with nodular hepatic contour, features suggesting cirrhosis. Electronically Signed   By: Misty Stanley M.D.   On: 09/26/2018 15:59   Assessment / Plan: 1) Cirrhosis/new onset ascites:  EtOH cirrhosis but has been abstinent for nearly 1 year per patient.  Cirrhosis complicated by new onset ascites but no prior history of GI bleed/esophageal varices or hepatic encephalopathy. -Paracentesis completed notable for high SAAG and low protein consistent with portal hypertension, and no SBP -Continue diuretics (is on Lasix 40 mg daily and Aldactone 50 mg daily).  K+ slightly low.  Will increase aldactone to 100 mg daily. -Up-to-date on [place] screening -EGD in 2018 without esophageal varices -Previously declined flu vaccine but otherwise UTD on vaccines -Previous zinc deficiency and started on supplementation -MELD 18 which conveys 6% 57-month mortality.  Child-Pugh B (9 points); Can consider referral to transplant hepatology given elevated MELD and EtOH abstinence as an outpatient.  2) Abdominal pain: Pain improved since paracentesis with 1.5 L removed yesterday.  States her abdomen was tense prior to paracentesis and now soft.  Of note, she was started on narcotics for abdominal pain in the last month or so.  Discussed that this is not a favorable long-term solution and she agrees and wishes to come off narcotics as soon as possible.  3) Dilated CBD: CBD 10 mm on RUQ ultrasound and 14 mm on admission CT.  Not previously dilated on  CT in September.  Discussed ddx for this radiographic finding with highest suspicion that this is actually secondary to recently starting opioids.  MRCP showed 10 mm CBD with no concern for CBD obstruction.  Chronically elevated T bilirubin, typically indirect on previous fractionization.  4) Pancytopenia: Follows with hematology as an outpatient with hemoglobin 6.8 grams on admission.  S/p 2 units PRBC's with increase to 8.7 grams.  No evidence of active GI bleeding currently.  Reports nosebleeds with large clots at times.  She says that Dr. Marin Olp mentioned splenectomy.  I advised her that should NOT be pursued in the setting of cirrhosis.  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  Recent evaluated in clinic with overall clinical improvement since starting Creon/Lomotil.  Will continue.  -Patient would like to go home today.  I discussed with Dr. Starla Link.  I think that she is stable to go.  I increased her aldactone to 100 mg daily so she will go home with that and lasix 40 mg daily.  I will have her come for labs to recheck renal function and electrolytes on Friday.  I also sent a message to Dr. Havery Moros about possibly initiating transplant referral.  Needs to follow a 2 gram sodium diet.   LOS: 2 days   Jamie Allen. Jamie Allen  130-Jul-202019, 9:32 AM

## 2018-09-28 NOTE — Telephone Encounter (Signed)
Records faxed to [phone] for appt to discuss transplant.

## 2018-09-28 NOTE — Progress Notes (Signed)
Patient ID: Elaysia Devargas, female   DOB: Mar 22, 1971, 47 y.o.   MRN: [id number]  PROGRESS NOTE    Ethne Jeon  TKP:[id number] DOB: 06/09/1971 DOA: 09/25/2018 PCP: Jeanella Anton, NP   Brief Narrative:  47 year old female with history of cirrhosis secondary to alcohol use along with [name], cholecystitis status post cholecystectomy, chronic anemia, thrombocytopenia presented with progressive abdominal distention and pain.  CT was positive for ascites with CBD dilatation.  GI was consulted.  Patient underwent paracentesis on 09/26/2018.   Assessment & Plan:   Active Problems:   Ascites   Common bile duct dilatation  Decompensated cirrhosis of liver with ascites with dilated CBD along with abnormal LFTs including bilirubin along with chronic anemia and thrombocytopenia -Status post paracentesis and removal of 1.5 L fluid on 09/26/2018.  Peritoneal fluid not consistent with SBP -Currently on oral spironolactone and Lasix as per GI recommendations.  Strict input and output, daily weights.  Fluid restriction -No signs of CBD obstruction on MRCP. -Follow LFTs.  Bilirubin slightly increased compared to yesterday -Hemoglobin is 8.7 today.  Status post transfusion of 1 unit of packed red cells on 09/27/2018.  Platelets 66 today.  Monitor  Hypokalemia -Replace.  Repeat a.m. labs  Suspected chronic pancreatic insufficiency -Continue Creon  Depression -Continue Zoloft  Chronic pain and peripheral neuropathy -Continue gabapentin.  Also on PRN oxycodone   DVT prophylaxis: SCDs only given low platelets Code Status: Full Family Communication: None at bedside Disposition Plan: Depends on clinical outcome and GI evaluation  Consultants: GI  Procedures: None  Antimicrobials: Rocephin from 09/25/2018-09/27/2018   Subjective: Patient seen and examined at bedside.  Patient feels hungry.  Denies worsening abdominal pain, fever, nausea or vomiting. Objective: Vitals:   09/27/18 1245  09/27/18 1519 09/27/18 2230 09/28/18 0511  BP: (!) 103/42 (!) 115/39 (!) 119/49 (!) 114/50  Pulse: 66 62 63 73  Resp: 16 14 16 17   Temp: 98.5 F (36.9 C) 98.6 F (37 C) 98.3 F (36.8 C) 98.3 F (36.8 C)  TempSrc: Oral Oral Oral Oral  SpO2: 95% 96% 98% 95%  Weight:      Height:        Intake/Output Summary (Last 24 hours) at 109-Jan-202019 1011 Last data filed at 109-Jan-202019 5170 Gross per 24 hour  Intake 1040 ml  Output 650 ml  Net 390 ml   Filed Weights   09/26/18 0155  Weight: 55.4 kg    Examination:  General exam: Appears calm and comfortable, no acute distress Respiratory system: Bilateral decreased breath sounds at bases Cardiovascular system: Rate controlled, S1-S2 heard Gastrointestinal system: Abdomen is slightly distended, soft and nontender. Normal bowel sounds heard. Extremities: No cyanosis; trace edema   Data Reviewed: I have personally reviewed following labs and imaging studies  CBC: Recent Labs  Lab 09/25/18 1738 09/26/18 0447 09/27/18 0356 09/28/18 0331  WBC 8.3 5.6 6.4 7.5  NEUTROABS  --  3.6 3.8 4.6  HGB 7.9* 7.1* 6.8* 8.7*  HCT 25.4* 22.8* 21.7* 26.6*  MCV 116.5* 114.0* 117.3* 106.8*  PLT 70* 58* 50* 66*   Basic Metabolic Panel: Recent Labs  Lab 09/25/18 1738 09/25/18 1828 09/26/18 0447 09/27/18 0356 09/28/18 0331  NA 137  --  138 136 137  K 3.3*  --  3.6 3.8 3.4*  CL 102  --  102 104 99  CO2 26  --  27 25 27   GLUCOSE 109*  --  100* 94 89  BUN 5*  --  5* 5* 7  CREATININE 0.66  --  0.64 0.63 0.69  CALCIUM 9.3  --  8.7* 8.8* 8.9  MG  --  2.0  --  2.0 1.7   GFR: Estimated Creatinine Clearance: 65.6 mL/min (by C-G formula based on SCr of 0.69 mg/dL). Liver Function Tests: Recent Labs  Lab 09/25/18 1738 09/26/18 0447 09/27/18 0356 09/28/18 0331  AST 59* 48* 47* 45*  ALT 25 22 21 21   ALKPHOS 145* 110 117 125  BILITOT 7.3* 5.9* 5.4* 6.2*  PROT 7.8 6.7 6.4* 7.2  ALBUMIN 3.6 3.0* 3.1* 3.2*   Recent Labs  Lab 09/25/18 1738    LIPASE 28   No results for input(s): AMMONIA in the last 168 hours. Coagulation Profile: Recent Labs  Lab 09/25/18 1813 09/26/18 0447  INR 1.33 1.44   Cardiac Enzymes: No results for input(s): CKTOTAL, CKMB, CKMBINDEX, TROPONINI in the last 168 hours. BNP (last 3 results) No results for input(s): PROBNP in the last 8760 hours. HbA1C: No results for input(s): HGBA1C in the last 72 hours. CBG: No results for input(s): GLUCAP in the last 168 hours. Lipid Profile: No results for input(s): CHOL, HDL, LDLCALC, TRIG, CHOLHDL, LDLDIRECT in the last 72 hours. Thyroid Function Tests: No results for input(s): TSH, T4TOTAL, FREET4, T3FREE, THYROIDAB in the last 72 hours. Anemia Panel: No results for input(s): VITAMINB12, FOLATE, FERRITIN, TIBC, IRON, RETICCTPCT in the last 72 hours. Sepsis Labs: No results for input(s): PROCALCITON, LATICACIDVEN in the last 168 hours.  No results found for this or any previous visit (from the past 240 hour(s)).       Radiology Studies: Mr 3d Recon At Scanner  Result Date: 108-09-2018 CLINICAL DATA:  Abnormal liver function tests. Ascites. Dilated extrahepatic biliary system. EXAM: MRI ABDOMEN WITHOUT AND WITH CONTRAST (INCLUDING MRCP) TECHNIQUE: Multiplanar multisequence MR imaging of the abdomen was performed both before and after the administration of intravenous contrast. Heavily T2-weighted images of the biliary and pancreatic ducts were obtained, and three-dimensional MRCP images were rendered by post processing. CONTRAST:  6 cc Gadavist COMPARISON:  Multiple exams, including 09/25/2018 CT and 09/26/2018 ultrasound FINDINGS: Lower chest: Upper normal heart size. Hepatobiliary: Hepatic morphology and lobularity favor cirrhosis. Prior cholecystectomy. Extrahepatic biliary dilatation noted with the common hepatic duct 1.2 cm in the common bile duct at 1.0 cm, with distal tapering of the CBD in the vicinity of the ampulla and no obvious filling defect.  Very minimal blunting and lack of conical tapering of the distal CBD for example on image 23/12. Unfortunately on all post-contrast series, the top half of the liver is severely obscured by some sort of boundary artifact causing an iris type of affect with abnormal low signal intensity bands through the liver, and much of the liver completely obscured. As result, sensitivity and specificity for lesions in the upper liver including hepatocellular carcinoma is markedly reduced. We are happy to repeat the exam given this unexpected and unusual artifact causing severe limitation. None of the other sequences were affected by this artifact, only the postcontrast axials. The post-contrast coronal images were not affected. Pancreas: Unremarkable. No pancreatic mass is observed. Dorsal pancreatic duct unremarkable. Spleen: The spleen measures 19.0 by 13.9 by 7.4 cm (volume = 1000 cm^3). No focal splenic lesion identified. Adrenals/Urinary Tract: Adrenal glands unremarkable. Left renal atrophy. Scarring of the left kidney upper pole. Stomach/Bowel: No appreciable ampullary mass. Vascular/Lymphatic: The splenic vein is patent. 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 Aortoiliac atherosclerotic vascular disease. Other:  Diffuse ascites.  Subcutaneous and mesenteric edema. Musculoskeletal: Unremarkable IMPRESSION: 1. Hepatic cirrhosis. Marked splenomegaly with splenorenal shunting noted. No splenic vein thrombosis. 2. Notable third spacing of fluid with ascites, subcutaneous edema, mesenteric edema. 3. Scarring in the left kidney upper pole. 4. Extrahepatic biliary dilatation with the CBD a 1.0 cm. Much of this may be a physiologic response to cholecystectomy. I do not see a definite filling defect or cause for obstruction of the CBD. The patient has known elevated total bilirubin. Assessment of direct and indirect bilirubin levels may help in further assessment of cause. Clinically warranted, a  nuclear medicine hepatobiliary scan can assess for hepatocellular dysfunction. 5. 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 6.  Aortic Atherosclerosis (ICD10-I70.0). 7. Please note that on today's exam, an unusual boundary artifact obscures the upper half of the liver on the post-contrast axial sequences. The cause of this artifact is uncertain in this case, but given that this is a technical issue we are happy to repeat the scan and contrast bolus to reassess the upper portion of the liver. Sensitivity for certain types of lesions in the upper liver may be reduced due to this artifact. Electronically Signed   By: Van Clines M.D.   On: 108/11/202019 07:52   US Paracentesis  Result Date: 09/26/2018 INDICATION: Patient with history of ETOH + NASH cirrhosis who presented to [facility] ED due to abdominal pain, distention and nausea. Request has been made for diagnostic and therapeutic paracentesis. EXAM: ULTRASOUND GUIDED DIAGNOSTIC AND THERAPEUTIC PARACENTESIS MEDICATIONS: 10 ml 1% lidocaine COMPLICATIONS: None immediate. PROCEDURE: Informed written consent was obtained from the patient after a discussion of the risks, benefits and alternatives to treatment. A timeout was performed prior to the initiation of the procedure. Initial ultrasound scanning demonstrates a moderate amount of ascites within the left lower abdominal quadrant. The left lower abdomen was prepped and draped in the usual sterile fashion. 1% lidocaine was used for local anesthesia. Following this, a 6 Fr Safe-T-Centesis catheter was introduced. An ultrasound image was saved for documentation purposes. The paracentesis was performed. The catheter was removed and a dressing was applied. The patient tolerated the procedure well without immediate post procedural complication. FINDINGS: A total of approximately 1.5 L of clear yellow fluid was removed. Samples were sent to the laboratory as requested by the clinical team.  IMPRESSION: Successful ultrasound-guided paracentesis yielding 1.5 liters of peritoneal fluid. Read by Candiss Norse, PA-C Electronically Signed   By: Aletta Edouard M.D.   On: 09/26/2018 15:12   Mr Abdomen Mrcp Moise Boring Contast  Result Date: 108/11/202019 CLINICAL DATA:  Abnormal liver function tests. Ascites. Dilated extrahepatic biliary system. EXAM: MRI ABDOMEN WITHOUT AND WITH CONTRAST (INCLUDING MRCP) TECHNIQUE: Multiplanar multisequence MR imaging of the abdomen was performed both before and after the administration of intravenous contrast. Heavily T2-weighted images of the biliary and pancreatic ducts were obtained, and three-dimensional MRCP images were rendered by post processing. CONTRAST:  6 cc Gadavist COMPARISON:  Multiple exams, including 09/25/2018 CT and 09/26/2018 ultrasound FINDINGS: Lower chest: Upper normal heart size. Hepatobiliary: Hepatic morphology and lobularity favor cirrhosis. Prior cholecystectomy. Extrahepatic biliary dilatation noted with the common hepatic duct 1.2 cm in the common bile duct at 1.0 cm, with distal tapering of the CBD in the vicinity of the ampulla and no obvious filling defect. Very minimal blunting and lack of conical tapering of the distal CBD for example on image 23/12. Unfortunately on all post-contrast series, the top half of the liver is severely obscured by some  sort of boundary artifact causing an iris type of affect with abnormal low signal intensity bands through the liver, and much of the liver completely obscured. As result, sensitivity and specificity for lesions in the upper liver including hepatocellular carcinoma is markedly reduced. We are happy to repeat the exam given this unexpected and unusual artifact causing severe limitation. None of the other sequences were affected by this artifact, only the postcontrast axials. The post-contrast coronal images were not affected. Pancreas: Unremarkable. No pancreatic mass is observed. Dorsal pancreatic  duct unremarkable. Spleen: The spleen measures 19.0 by 13.9 by 7.4 cm (volume = 1000 cm^3). No focal splenic lesion identified. Adrenals/Urinary Tract: Adrenal glands unremarkable. Left renal atrophy. Scarring of the left kidney upper pole. Stomach/Bowel: No appreciable ampullary mass. Vascular/Lymphatic: The splenic vein is patent. 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 Aortoiliac atherosclerotic vascular disease. Other:  Diffuse ascites.  Subcutaneous and mesenteric edema. Musculoskeletal: Unremarkable IMPRESSION: 1. Hepatic cirrhosis. Marked splenomegaly with splenorenal shunting noted. No splenic vein thrombosis. 2. Notable third spacing of fluid with ascites, subcutaneous edema, mesenteric edema. 3. Scarring in the left kidney upper pole. 4. Extrahepatic biliary dilatation with the CBD a 1.0 cm. Much of this may be a physiologic response to cholecystectomy. I do not see a definite filling defect or cause for obstruction of the CBD. The patient has known elevated total bilirubin. Assessment of direct and indirect bilirubin levels may help in further assessment of cause. Clinically warranted, a nuclear medicine hepatobiliary scan can assess for hepatocellular dysfunction. 5. 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 6.  Aortic Atherosclerosis (ICD10-I70.0). 7. Please note that on today's exam, an unusual boundary artifact obscures the upper half of the liver on the post-contrast axial sequences. The cause of this artifact is uncertain in this case, but given that this is a technical issue we are happy to repeat the scan and contrast bolus to reassess the upper portion of the liver. Sensitivity for certain types of lesions in the upper liver may be reduced due to this artifact. Electronically Signed   By: Van Clines M.D.   On: 103-18-2019 07:52   US Abdomen Limited Ruq  Result Date: 09/26/2018 CLINICAL DATA:  Cirrhosis and ascites on CT scan.  Biliary dilatation. EXAM: ULTRASOUND ABDOMEN LIMITED RIGHT UPPER QUADRANT COMPARISON:  CT scan 09/25/2018. FINDINGS: Gallbladder: Surgically absent. Common bile duct: Diameter: Dilated to 10.4 mm diameter. Liver: Heterogeneous echotexture with subtle nodularity of contour. No focal intraparenchymal abnormality. Portal vein is patent on color Doppler imaging with normal direction of blood flow towards the liver. IMPRESSION: Similar appearance of common duct dilatation, now measuring 10 mm. Heterogeneous liver parenchyma with nodular hepatic contour, features suggesting cirrhosis. Electronically Signed   By: Misty Stanley M.D.   On: 09/26/2018 15:59        Scheduled Meds: . sodium chloride   Intravenous Once  . folic acid  1 mg Oral Daily  . furosemide  40 mg Oral Daily  . gabapentin  300 mg Oral TID  . lipase/protease/amylase  72,000 Units Oral TID AC  . sertraline  50 mg Oral Daily  . spironolactone  50 mg Oral Daily   Continuous Infusions:    LOS: 2 days        Aline August, MD [facility] Pager [phone]  If 7PM-7AM, please contact night-coverage [email or web address] Password [email or web address] 103-18-2019, 10:11 AM

## 2018-09-28 NOTE — Discharge Summary (Signed)
Physician Discharge Summary  Jamie Allen YJE:[id number] DOB: 1970-12-07 DOA: 09/25/2018  PCP: Jeanella Anton, NP  Admit date: 09/25/2018 Discharge date: 1Jun 12, 202019  Admitted From: Home Disposition:  Home  Recommendations for Outpatient Follow-up:  1. Follow up with PCP in 1 week with repeat CBC/BMP 2. Follow-up with gastroenterology as an outpatient 3. Follow-up in the ED if symptoms worsen or new appear   Home Health: No Equipment/Devices: None Discharge Condition: Stable CODE STATUS: Full Diet recommendation: Heart Healthy    Brief/Interim Summary: 47 year old female with history of cirrhosis secondary to alcohol use along with Jamie Allen, cholecystitis status post cholecystectomy, chronic anemia, thrombocytopenia presented with progressive abdominal distention and pain.  CT was positive for ascites with CBD dilatation.  GI was consulted.  Patient underwent paracentesis on 09/26/2018.  MRCP did not show any CBD obstruction.  Lasix and spironolactone were started.  Condition improved.  GI has cleared the patient for discharge.  Discharge Diagnoses:  Active Problems:   Cirrhosis ([place])   Anemia of chronic disease   Thrombocytopenia ([facility])   Ascites   Common bile duct dilatation  Decompensated cirrhosis of liver with ascites with dilated CBD along with abnormal LFTs including bilirubin along with chronic anemia and thrombocytopenia -Status post paracentesis and removal of 1.5 L fluid on 09/26/2018.  Peritoneal fluid not consistent with SBP -Currently on oral spironolactone and Lasix as per GI recommendations.    GI has cleared the patient for discharge on 40 mg Lasix along with spironolactone 100 mg daily.  Outpatient follow-up with GI with repeat labs at earliest convenience -No signs of CBD obstruction on MRCP. -Follow LFTs as an outpatient.  Bilirubin slightly increased compared to yesterday -Hemoglobin is 8.7 today.  Status post transfusion of 1 unit of packed red cells on  09/27/2018.  Platelets 66 today.  Outpatient follow-up  Hypokalemia -Replace.  Outpatient follow-up  Suspected chronic pancreatic insufficiency -Continue Creon  Depression -Continue Zoloft  Chronic pain and peripheral neuropathy -Continue gabapentin.  Also on PRN oxycodone.  Outpatient follow-up  Discharge Instructions  Discharge Instructions    Ambulatory referral to Gastroenterology   Complete by:  As directed    Call MD for:  difficulty breathing, headache or visual disturbances   Complete by:  As directed    Call MD for:  extreme fatigue   Complete by:  As directed    Call MD for:  hives   Complete by:  As directed    Call MD for:  persistant dizziness or light-headedness   Complete by:  As directed    Call MD for:  persistant nausea and vomiting   Complete by:  As directed    Call MD for:  severe uncontrolled pain   Complete by:  As directed    Call MD for:  temperature >100.4   Complete by:  As directed    Diet - low sodium heart healthy   Complete by:  As directed    Increase activity slowly   Complete by:  As directed      Allergies as of 1Jun 12, 202019      Reactions   Iohexol Hives, Itching, Swelling   Lactose Intolerance (gi) Diarrhea, Nausea Only   Other Other (See Comments)   Spicy foods "tear up my stomach"   Vancomycin Itching   Nose itching   Morphine And Related Itching, Other (See Comments)   Can tolerate with Benadryl      Medication List    STOP taking these medications   nicotine 14 mg/24hr  patch Commonly known as:  NICODERM CQ - dosed in mg/24 hours     TAKE these medications   diphenoxylate-atropine 2.5-0.025 MG tablet Commonly known as:  LOMOTIL Take 1 tablet by mouth 2 (two) times daily as needed for diarrhea or loose stools.   esomeprazole 20 MG capsule Commonly known as:  NEXIUM Take 1 capsule (20 mg total) by mouth daily. What changed:    when to take this  reasons to take this   folic acid 1 MG tablet Commonly known  as:  FOLVITE Take 1 tablet (1 mg total) by mouth daily.   furosemide 40 MG tablet Commonly known as:  LASIX Take 1 tablet (40 mg total) by mouth daily. Start taking on:  09/29/2018   gabapentin 300 MG capsule Commonly known as:  NEURONTIN Take 1 capsule (300 mg total) by mouth 3 (three) times daily.   lipase/protease/amylase 36000 UNITS Cpep capsule Commonly known as:  CREON Take 2 capsules (72,000 Units total) by mouth 3 (three) times daily before meals.   Melatonin 10 MG Tabs Take 10 mg by mouth at bedtime as needed (for sleep).   metoCLOPramide 10 MG tablet Commonly known as:  REGLAN Take 1 tablet (10 mg total) by mouth every 8 (eight) hours as needed for nausea.   NARCAN 4 MG/0.1ML Liqd nasal spray kit Generic drug:  naloxone Place 0.1 mg into the nose daily as needed (accidental overdose).   ondansetron 4 MG disintegrating tablet Commonly known as:  ZOFRAN-ODT Take 1 tablet (4 mg total) by mouth every 8 (eight) hours as needed for nausea or vomiting.   oxyCODONE 5 MG immediate release tablet Commonly known as:  Oxy IR/ROXICODONE Take 1 tablet (5 mg total) by mouth every 6 (six) hours as needed for moderate pain. What changed:    when to take this  reasons to take this   sertraline 50 MG tablet Commonly known as:  ZOLOFT Take 1 tablet (50 mg total) by mouth daily.   spironolactone 100 MG tablet Commonly known as:  ALDACTONE Take 1 tablet (100 mg total) by mouth daily. Start taking on:  09/29/2018   sucralfate 1 GM/10ML suspension Commonly known as:  CARAFATE Take 10 mLs (1 g total) by mouth 4 (four) times daily -  with meals and at bedtime.   zinc sulfate 220 (50 Zn) MG capsule Take 1 capsule (220 mg total) by mouth daily.      Follow-up Information    Jeanella Anton, NP. Schedule an appointment as soon as possible for a visit in 1 week(s).   Specialty:  Nurse Practitioner Why:  With repeat CBC/BMP Contact information: [place] [address] [phone]          Allergies  Allergen Reactions  . Iohexol Hives, Itching and Swelling  . Lactose Intolerance (Gi) Diarrhea and Nausea Only  . Other Other (See Comments)    Spicy foods "tear up my stomach"  . Vancomycin Itching    Nose itching  . Morphine And Related Itching and Other (See Comments)    Can tolerate with Benadryl    Consultations:  Gastroenterology   Procedures/Studies: Ct Abdomen Pelvis Wo Contrast  Result Date: 09/25/2018 CLINICAL DATA:  Abdominal distension EXAM: CT ABDOMEN AND PELVIS WITHOUT CONTRAST TECHNIQUE: Multidetector CT imaging of the abdomen and pelvis was performed following the standard protocol without IV contrast. COMPARISON:  CT 07/15/18 FINDINGS: Lower chest: Lung bases are clear. Hepatobiliary: No focal hepatic lesion on noncontrast exam. Postcholecystectomy. Caudate lobe is  enlarged. Interval increase in intraperitoneal free fluid along the margin RIGHT hepatic lobe. Common bile duct is prominent measuring 14 mm through the pancreatic head which is increased from 5 mm on comparison CT. No obstructing lesion identified. Pancreatic head appear normal on contrast CT 07/15/2018 Pancreas: Pancreas is normal. No ductal dilatation. No pancreatic inflammation. Spleen: Spleen is enlarged similar comparison exam spleen measures 18 cm in craniocaudad dimension Adrenals/urinary tract: Adrenal glands and kidneys are normal. The ureters and bladder normal. Stomach/Bowel: Stomach, small bowel, appendix, and cecum are normal. The colon and rectosigmoid colon are normal. Vascular/Lymphatic: Abdominal aorta is normal caliber. No periportal or retroperitoneal adenopathy. No pelvic adenopathy. Reproductive: Uterus and ovaries normal. Other: Moderate large volume intraperitoneal free fluid increased from comparison exam. Musculoskeletal: No aggressive osseous lesion. IMPRESSION: 1. New moderate volume intraperitoneal free fluid. Presumably  ascites from portal hypertension and cirrhosis. 2. New dilatation of the common hepatic duct and common bile duct through the pancreatic head. No obstructing lesion identified. No lesion within the pancreatic head on CT 07/15/2018. Unclear etiology of dilatation. Consider ERCP with elevated bilirubin. 3. Cirrhotic liver morphology and splenomegaly. Electronically Signed   By: Suzy Bouchard M.D.   On: 09/25/2018 22:46   Mr 3d Recon At Scanner  Result Date: 102/24/2019 CLINICAL DATA:  Abnormal liver function tests. Ascites. Dilated extrahepatic biliary system. EXAM: MRI ABDOMEN WITHOUT AND WITH CONTRAST (INCLUDING MRCP) TECHNIQUE: Multiplanar multisequence MR imaging of the abdomen was performed both before and after the administration of intravenous contrast. Heavily T2-weighted images of the biliary and pancreatic ducts were obtained, and three-dimensional MRCP images were rendered by post processing. CONTRAST:  6 cc Gadavist COMPARISON:  Multiple exams, including 09/25/2018 CT and 09/26/2018 ultrasound FINDINGS: Lower chest: Upper normal heart size. Hepatobiliary: Hepatic morphology and lobularity favor cirrhosis. Prior cholecystectomy. Extrahepatic biliary dilatation noted with the common hepatic duct 1.2 cm in the common bile duct at 1.0 cm, with distal tapering of the CBD in the vicinity of the ampulla and no obvious filling defect. Very minimal blunting and lack of conical tapering of the distal CBD for example on image 23/12. Unfortunately on all post-contrast series, the top half of the liver is severely obscured by some sort of boundary artifact causing an iris type of affect with abnormal low signal intensity bands through the liver, and much of the liver completely obscured. As result, sensitivity and specificity for lesions in the upper liver including hepatocellular carcinoma is markedly reduced. We are happy to repeat the exam given this unexpected and unusual artifact causing severe limitation.  None of the other sequences were affected by this artifact, only the postcontrast axials. The post-contrast coronal images were not affected. Pancreas: Unremarkable. No pancreatic mass is observed. Dorsal pancreatic duct unremarkable. Spleen: The spleen measures 19.0 by 13.9 by 7.4 cm (volume = 1000 cm^3). No focal splenic lesion identified. Adrenals/Urinary Tract: Adrenal glands unremarkable. Left renal atrophy. Scarring of the left kidney upper pole. Stomach/Bowel: No appreciable ampullary mass. Vascular/Lymphatic: The splenic vein is patent. 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 Aortoiliac atherosclerotic vascular disease. Other:  Diffuse ascites.  Subcutaneous and mesenteric edema. Musculoskeletal: Unremarkable IMPRESSION: 1. Hepatic cirrhosis. Marked splenomegaly with splenorenal shunting noted. No splenic vein thrombosis. 2. Notable third spacing of fluid with ascites, subcutaneous edema, mesenteric edema. 3. Scarring in the left kidney upper pole. 4. Extrahepatic biliary dilatation with the CBD a 1.0 cm. Much of this may be a physiologic response to cholecystectomy. I do not see a  definite filling defect or cause for obstruction of the CBD. The patient has known elevated total bilirubin. Assessment of direct and indirect bilirubin levels may help in further assessment of cause. Clinically warranted, a nuclear medicine hepatobiliary scan can assess for hepatocellular dysfunction. 5. 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 6.  Aortic Atherosclerosis (ICD10-I70.0). 7. Please note that on today's exam, an unusual boundary artifact obscures the upper half of the liver on the post-contrast axial sequences. The cause of this artifact is uncertain in this case, but given that this is a technical issue we are happy to repeat the scan and contrast bolus to reassess the upper portion of the liver. Sensitivity for certain types of lesions in the upper liver may  be reduced due to this artifact. Electronically Signed   By: Van Clines M.D.   On: 102-01-202019 07:52   US Paracentesis  Result Date: 09/26/2018 INDICATION: Patient with history of ETOH + NASH cirrhosis who presented to [facility] ED due to abdominal pain, distention and nausea. Request has been made for diagnostic and therapeutic paracentesis. EXAM: ULTRASOUND GUIDED DIAGNOSTIC AND THERAPEUTIC PARACENTESIS MEDICATIONS: 10 ml 1% lidocaine COMPLICATIONS: None immediate. PROCEDURE: Informed written consent was obtained from the patient after a discussion of the risks, benefits and alternatives to treatment. A timeout was performed prior to the initiation of the procedure. Initial ultrasound scanning demonstrates a moderate amount of ascites within the left lower abdominal quadrant. The left lower abdomen was prepped and draped in the usual sterile fashion. 1% lidocaine was used for local anesthesia. Following this, a 6 Fr Safe-T-Centesis catheter was introduced. An ultrasound image was saved for documentation purposes. The paracentesis was performed. The catheter was removed and a dressing was applied. The patient tolerated the procedure well without immediate post procedural complication. FINDINGS: A total of approximately 1.5 L of clear yellow fluid was removed. Samples were sent to the laboratory as requested by the clinical team. IMPRESSION: Successful ultrasound-guided paracentesis yielding 1.5 liters of peritoneal fluid. Read by Candiss Norse, PA-C Electronically Signed   By: Aletta Edouard M.D.   On: 09/26/2018 15:12   Mr Abdomen Mrcp Moise Boring Contast  Result Date: 102-01-202019 CLINICAL DATA:  Abnormal liver function tests. Ascites. Dilated extrahepatic biliary system. EXAM: MRI ABDOMEN WITHOUT AND WITH CONTRAST (INCLUDING MRCP) TECHNIQUE: Multiplanar multisequence MR imaging of the abdomen was performed both before and after the administration of intravenous contrast. Heavily T2-weighted images of the  biliary and pancreatic ducts were obtained, and three-dimensional MRCP images were rendered by post processing. CONTRAST:  6 cc Gadavist COMPARISON:  Multiple exams, including 09/25/2018 CT and 09/26/2018 ultrasound FINDINGS: Lower chest: Upper normal heart size. Hepatobiliary: Hepatic morphology and lobularity favor cirrhosis. Prior cholecystectomy. Extrahepatic biliary dilatation noted with the common hepatic duct 1.2 cm in the common bile duct at 1.0 cm, with distal tapering of the CBD in the vicinity of the ampulla and no obvious filling defect. Very minimal blunting and lack of conical tapering of the distal CBD for example on image 23/12. Unfortunately on all post-contrast series, the top half of the liver is severely obscured by some sort of boundary artifact causing an iris type of affect with abnormal low signal intensity bands through the liver, and much of the liver completely obscured. As result, sensitivity and specificity for lesions in the upper liver including hepatocellular carcinoma is markedly reduced. We are happy to repeat the exam given this unexpected and unusual artifact causing severe limitation. None of the other sequences were  affected by this artifact, only the postcontrast axials. The post-contrast coronal images were not affected. Pancreas: Unremarkable. No pancreatic mass is observed. Dorsal pancreatic duct unremarkable. Spleen: The spleen measures 19.0 by 13.9 by 7.4 cm (volume = 1000 cm^3). No focal splenic lesion identified. Adrenals/Urinary Tract: Adrenal glands unremarkable. Left renal atrophy. Scarring of the left kidney upper pole. Stomach/Bowel: No appreciable ampullary mass. Vascular/Lymphatic: The splenic vein is patent. 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 Aortoiliac atherosclerotic vascular disease. Other:  Diffuse ascites.  Subcutaneous and mesenteric edema. Musculoskeletal: Unremarkable IMPRESSION: 1. Hepatic cirrhosis. Marked  splenomegaly with splenorenal shunting noted. No splenic vein thrombosis. 2. Notable third spacing of fluid with ascites, subcutaneous edema, mesenteric edema. 3. Scarring in the left kidney upper pole. 4. Extrahepatic biliary dilatation with the CBD a 1.0 cm. Much of this may be a physiologic response to cholecystectomy. I do not see a definite filling defect or cause for obstruction of the CBD. The patient has known elevated total bilirubin. Assessment of direct and indirect bilirubin levels may help in further assessment of cause. Clinically warranted, a nuclear medicine hepatobiliary scan can assess for hepatocellular dysfunction. 5. 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 6.  Aortic Atherosclerosis (ICD10-I70.0). 7. Please note that on today's exam, an unusual boundary artifact obscures the upper half of the liver on the post-contrast axial sequences. The cause of this artifact is uncertain in this case, but given that this is a technical issue we are happy to repeat the scan and contrast bolus to reassess the upper portion of the liver. Sensitivity for certain types of lesions in the upper liver may be reduced due to this artifact. Electronically Signed   By: Van Clines M.D.   On: 107/19/202019 07:52   US Abdomen Limited Ruq  Result Date: 09/26/2018 CLINICAL DATA:  Cirrhosis and ascites on CT scan. Biliary dilatation. EXAM: ULTRASOUND ABDOMEN LIMITED RIGHT UPPER QUADRANT COMPARISON:  CT scan 09/25/2018. FINDINGS: Gallbladder: Surgically absent. Common bile duct: Diameter: Dilated to 10.4 mm diameter. Liver: Heterogeneous echotexture with subtle nodularity of contour. No focal intraparenchymal abnormality. Portal vein is patent on color Doppler imaging with normal direction of blood flow towards the liver. IMPRESSION: Similar appearance of common duct dilatation, now measuring 10 mm. Heterogeneous liver parenchyma with nodular hepatic contour, features suggesting cirrhosis.  Electronically Signed   By: Misty Stanley M.D.   On: 09/26/2018 15:59       Subjective: Patient seen and examined at bedside.  Patient feels hungry.  Denies worsening abdominal pain, fever, nausea or vomiting.  Discharge Exam: Vitals:   09/27/18 2230 09/28/18 0511  BP: (!) 119/49 (!) 114/50  Pulse: 63 73  Resp: 16 17  Temp: 98.3 F (36.8 C) 98.3 F (36.8 C)  SpO2: 98% 95%   Vitals:   09/27/18 1245 09/27/18 1519 09/27/18 2230 09/28/18 0511  BP: (!) 103/42 (!) 115/39 (!) 119/49 (!) 114/50  Pulse: 66 62 63 73  Resp: 16 14 16 17   Temp: 98.5 F (36.9 C) 98.6 F (37 C) 98.3 F (36.8 C) 98.3 F (36.8 C)  TempSrc: Oral Oral Oral Oral  SpO2: 95% 96% 98% 95%  Weight:      Height:        General exam: Appears calm and comfortable, no acute distress Respiratory system: Bilateral decreased breath sounds at bases Cardiovascular system: Rate controlled, S1-S2 heard Gastrointestinal system: Abdomen is slightly distended, soft and nontender. Normal bowel sounds heard. Extremities: No cyanosis; trace edema  The results of significant diagnostics from this hospitalization (including imaging, microbiology, ancillary and laboratory) are listed below for reference.     Microbiology: No results found for this or any previous visit (from the past 240 hour(s)).   Labs: BNP (last 3 results) No results for input(s): BNP in the last 8760 hours. Basic Metabolic Panel: Recent Labs  Lab 09/25/18 1738 09/25/18 1828 09/26/18 0447 09/27/18 0356 09/28/18 0331  NA 137  --  138 136 137  K 3.3*  --  3.6 3.8 3.4*  CL 102  --  102 104 99  CO2 26  --  27 25 27   GLUCOSE 109*  --  100* 94 89  BUN 5*  --  5* 5* 7  CREATININE 0.66  --  0.64 0.63 0.69  CALCIUM 9.3  --  8.7* 8.8* 8.9  MG  --  2.0  --  2.0 1.7   Liver Function Tests: Recent Labs  Lab 09/25/18 1738 09/26/18 0447 09/27/18 0356 09/28/18 0331  AST 59* 48* 47* 45*  ALT 25 22 21 21   ALKPHOS 145* 110 117 125  BILITOT 7.3*  5.9* 5.4* 6.2*  PROT 7.8 6.7 6.4* 7.2  ALBUMIN 3.6 3.0* 3.1* 3.2*   Recent Labs  Lab 09/25/18 1738  LIPASE 28   No results for input(s): AMMONIA in the last 168 hours. CBC: Recent Labs  Lab 09/25/18 1738 09/26/18 0447 09/27/18 0356 09/28/18 0331  WBC 8.3 5.6 6.4 7.5  NEUTROABS  --  3.6 3.8 4.6  HGB 7.9* 7.1* 6.8* 8.7*  HCT 25.4* 22.8* 21.7* 26.6*  MCV 116.5* 114.0* 117.3* 106.8*  PLT 70* 58* 50* 66*   Cardiac Enzymes: No results for input(s): CKTOTAL, CKMB, CKMBINDEX, TROPONINI in the last 168 hours. BNP: Invalid input(s): POCBNP CBG: No results for input(s): GLUCAP in the last 168 hours. D-Dimer No results for input(s): DDIMER in the last 72 hours. Hgb A1c No results for input(s): HGBA1C in the last 72 hours. Lipid Profile No results for input(s): CHOL, HDL, LDLCALC, TRIG, CHOLHDL, LDLDIRECT in the last 72 hours. Thyroid function studies No results for input(s): TSH, T4TOTAL, T3FREE, THYROIDAB in the last 72 hours.  Invalid input(s): FREET3 Anemia work up No results for input(s): VITAMINB12, FOLATE, FERRITIN, TIBC, IRON, RETICCTPCT in the last 72 hours. Urinalysis    Component Value Date/Time   COLORURINE AMBER (A) 09/25/2018 2300   APPEARANCEUR HAZY (A) 09/25/2018 2300   APPEARANCEUR Clear 02/04/2018 1118   LABSPEC 1.017 09/25/2018 2300   PHURINE 7.0 09/25/2018 2300   GLUCOSEU NEGATIVE 09/25/2018 2300   HGBUR NEGATIVE 09/25/2018 2300   BILIRUBINUR SMALL (A) 09/25/2018 2300   BILIRUBINUR Negative 02/04/2018 1118   KETONESUR NEGATIVE 09/25/2018 2300   PROTEINUR NEGATIVE 09/25/2018 2300   UROBILINOGEN 0.2 05/19/2016 1638   UROBILINOGEN 0.2 08/17/2015 1853   NITRITE NEGATIVE 09/25/2018 2300   LEUKOCYTESUR TRACE (A) 09/25/2018 2300   LEUKOCYTESUR Negative 02/04/2018 1118   Sepsis Labs Invalid input(s): PROCALCITONIN,  WBC,  LACTICIDVEN Microbiology No results found for this or any previous visit (from the past 240 hour(s)).   Time coordinating  discharge: 35 minutes  SIGNED:   Aline August, MD  [facility] 127-Aug-202019, 11:07 AM Pager: [phone]  If 7PM-7AM, please contact night-coverage [email or web address] Password [email or web address]

## 2018-09-28 NOTE — Telephone Encounter (Signed)
-----   Message from Yetta Flock, MD sent at 1Sep 05, 202019  1:13 PM EST ----- Thanks Jess, yes I think that is fine. She should follow up with [name] [facility] to make sure she is stable though.  [place] [facility], can you please refer this patient to Hallam Drazek's office, for transplant evaluation? Thanks  Richardson Landry  ----- Message ----- From: Ritta Slot Sent: 1Sep 05, 202019  10:47 AM EST To: Yetta Flock, MD  Patient is in the hospital.  Will be going home in the next day or so.  She is stable but is asking about transplant referral.  Would you mind initiating that?  Thank you,  Jess

## 2018-09-28 NOTE — Progress Notes (Signed)
Patient discharged to home w/ SO. Given all belongings, instructions. Verbalized understanding of instructions. Escorted to pov via w/c. °

## 2018-10-01 ENCOUNTER — Telehealth: Payer: Self-pay | Admitting: *Deleted

## 2018-10-01 ENCOUNTER — Telehealth: Payer: Self-pay

## 2018-10-01 ENCOUNTER — Inpatient Hospital Stay: Payer: Medicaid Other | Admitting: Family

## 2018-10-01 ENCOUNTER — Inpatient Hospital Stay: Payer: Medicaid Other

## 2018-10-01 NOTE — Telephone Encounter (Signed)
Thanks Jan

## 2018-10-01 NOTE — Telephone Encounter (Signed)
Patient has appt with Dr. Zollie Scale at [facility] on 11-02-2018 at 2:00pm.

## 2018-10-01 NOTE — Telephone Encounter (Signed)
Patient called and stated, "please tell Dr. Marin Olp that I am going to have a liver transplant. My sister matched as my donor. I need to cancel today's appointments because I am having blood work drawn today and meeting with the transplant team. I told her I would cancel her appointments today, and inform Dr. Marin Olp. She will call this office when she knows the date of the transplant.

## 2018-10-03 ENCOUNTER — Emergency Department (HOSPITAL_COMMUNITY)
Admission: EM | Admit: 2018-10-03 | Discharge: 2018-10-03 | Disposition: A | Discharge status: Home / Self Care | Payer: Medicaid Other | Attending: Emergency Medicine | Admitting: Emergency Medicine

## 2018-10-03 ENCOUNTER — Other Ambulatory Visit: Payer: Self-pay

## 2018-10-03 ENCOUNTER — Encounter (HOSPITAL_COMMUNITY): Payer: Self-pay | Admitting: Emergency Medicine

## 2018-10-03 DIAGNOSIS — I10 Essential (primary) hypertension: Secondary | ICD-10-CM | POA: Insufficient documentation

## 2018-10-03 DIAGNOSIS — E039 Hypothyroidism, unspecified: Secondary | ICD-10-CM | POA: Insufficient documentation

## 2018-10-03 DIAGNOSIS — R109 Unspecified abdominal pain: Secondary | ICD-10-CM | POA: Diagnosis present

## 2018-10-03 DIAGNOSIS — R112 Nausea with vomiting, unspecified: Secondary | ICD-10-CM | POA: Insufficient documentation

## 2018-10-03 DIAGNOSIS — F17210 Nicotine dependence, cigarettes, uncomplicated: Secondary | ICD-10-CM | POA: Diagnosis not present

## 2018-10-03 DIAGNOSIS — Z79899 Other long term (current) drug therapy: Secondary | ICD-10-CM | POA: Diagnosis not present

## 2018-10-03 DIAGNOSIS — E860 Dehydration: Secondary | ICD-10-CM | POA: Diagnosis not present

## 2018-10-03 DIAGNOSIS — R1084 Generalized abdominal pain: Secondary | ICD-10-CM | POA: Insufficient documentation

## 2018-10-03 LAB — CBC
HCT: 30.4 % — ABNORMAL LOW (ref 36.0–46.0)
HEMOGLOBIN: 9.9 g/dL — AB (ref 12.0–15.0)
MCH: 34.6 pg — ABNORMAL HIGH (ref 26.0–34.0)
MCHC: 32.6 g/dL (ref 30.0–36.0)
MCV: 106.3 fL — ABNORMAL HIGH (ref 80.0–100.0)
Platelets: 81 10*3/uL — ABNORMAL LOW (ref 150–400)
RBC: 2.86 MIL/uL — ABNORMAL LOW (ref 3.87–5.11)
RDW: 20 % — ABNORMAL HIGH (ref 11.5–15.5)
WBC: 8.1 10*3/uL (ref 4.0–10.5)
nRBC: 0 % (ref 0.0–0.2)

## 2018-10-03 LAB — COMPREHENSIVE METABOLIC PANEL
ALT: 39 U/L (ref 0–44)
AST: 118 U/L — ABNORMAL HIGH (ref 15–41)
Albumin: 3.7 g/dL (ref 3.5–5.0)
Alkaline Phosphatase: 133 U/L — ABNORMAL HIGH (ref 38–126)
Anion gap: 11 (ref 5–15)
BUN: 11 mg/dL (ref 6–20)
CHLORIDE: 102 mmol/L (ref 98–111)
CO2: 24 mmol/L (ref 22–32)
Calcium: 9.7 mg/dL (ref 8.9–10.3)
Creatinine, Ser: 0.73 mg/dL (ref 0.44–1.00)
GFR calc Af Amer: >60 mL/min (ref >60)
GFR calc non Af Amer: >60 mL/min (ref >60)
Glucose, Bld: 125 mg/dL — ABNORMAL HIGH (ref 70–99)
Potassium: 3.9 mmol/L (ref 3.5–5.1)
SODIUM: 137 mmol/L (ref 135–145)
Total Bilirubin: 8.1 mg/dL — ABNORMAL HIGH (ref 0.3–1.2)
Total Protein: 7.7 g/dL (ref 6.5–8.1)

## 2018-10-03 LAB — I-STAT BETA HCG BLOOD, ED (MC, WL, AP ONLY): I-stat hCG, quantitative: <5 m[IU]/mL (ref <5)

## 2018-10-03 LAB — LIPASE, BLOOD: LIPASE: 28 U/L (ref 11–51)

## 2018-10-03 MED ORDER — ONDANSETRON HCL 4 MG/2ML IJ SOLN
4.0000 mg | Freq: Once | INTRAMUSCULAR | Status: AC
  Administered 2018-10-03: 4 mg via INTRAVENOUS
  Filled 2018-10-03: qty 2

## 2018-10-03 MED ORDER — PROMETHAZINE HCL 25 MG RE SUPP: 25.0000 mg | Freq: Four times a day (QID) | RECTAL | 0 refills | Status: DC | PRN

## 2018-10-03 MED ORDER — OXYCODONE HCL 5 MG PO TABS
5.0000 mg | ORAL_TABLET | Freq: Once | ORAL | Status: AC
  Administered 2018-10-03: 5 mg via ORAL
  Filled 2018-10-03: qty 1

## 2018-10-03 MED ORDER — HYDROMORPHONE HCL 1 MG/ML IJ SOLN
1.0000 mg | Freq: Once | INTRAMUSCULAR | Status: AC
  Administered 2018-10-03: 1 mg via INTRAVENOUS
  Filled 2018-10-03: qty 1

## 2018-10-03 MED ORDER — SODIUM CHLORIDE 0.9 % IV BOLUS
1000.0000 mL | Freq: Once | INTRAVENOUS | Status: AC
  Administered 2018-10-03: 1000 mL via INTRAVENOUS

## 2018-10-03 NOTE — ED Triage Notes (Signed)
Patient comes from home by [facility]. Patient states she is having right and left abdominal pain. Patient states her abdomen is draining. Patient is able to take her pain medication. Patient stopped taking her lasix after the first dose when she was discharged. Patient states she has not ate or drank anything for two days.

## 2018-10-03 NOTE — ED Notes (Signed)
Requested urine from patient. Patient states she can not urinate at this time.

## 2018-10-03 NOTE — ED Notes (Signed)
Bed: FH54 Expected date:  Expected time:  Means of arrival:  Comments: abd pain

## 2018-10-05 ENCOUNTER — Other Ambulatory Visit (INDEPENDENT_AMBULATORY_CARE_PROVIDER_SITE_OTHER): Payer: Medicaid Other

## 2018-10-05 DIAGNOSIS — K529 Noninfective gastroenteritis and colitis, unspecified: Secondary | ICD-10-CM

## 2018-10-05 DIAGNOSIS — R110 Nausea: Secondary | ICD-10-CM

## 2018-10-05 DIAGNOSIS — K7030 Alcoholic cirrhosis of liver without ascites: Secondary | ICD-10-CM | POA: Diagnosis not present

## 2018-10-05 DIAGNOSIS — E60 Dietary zinc deficiency: Secondary | ICD-10-CM

## 2018-10-05 LAB — COMPREHENSIVE METABOLIC PANEL
ALT: 31 U/L (ref 0–35)
AST: 77 U/L — ABNORMAL HIGH (ref 0–37)
Albumin: 3.9 g/dL (ref 3.5–5.2)
Alkaline Phosphatase: 139 U/L — ABNORMAL HIGH (ref 39–117)
BUN: 10 mg/dL (ref 6–23)
CO2: 25 mEq/L (ref 19–32)
CREATININE: 0.93 mg/dL (ref 0.40–1.20)
Calcium: 9.5 mg/dL (ref 8.4–10.5)
Chloride: 99 mEq/L (ref 96–112)
GFR: 68.4 mL/min (ref >60.00)
Glucose, Bld: 168 mg/dL — ABNORMAL HIGH (ref 70–99)
Potassium: 3.6 mEq/L (ref 3.5–5.1)
Sodium: 135 mEq/L (ref 135–145)
Total Bilirubin: 5.7 mg/dL — ABNORMAL HIGH (ref 0.2–1.2)
Total Protein: 8.1 g/dL (ref 6.0–8.3)

## 2018-10-05 LAB — HEPATIC FUNCTION PANEL
ALT: 31 U/L (ref 0–35)
AST: 77 U/L — ABNORMAL HIGH (ref 0–37)
Albumin: 3.9 g/dL (ref 3.5–5.2)
Alkaline Phosphatase: 139 U/L — ABNORMAL HIGH (ref 39–117)
BILIRUBIN DIRECT: 1.6 mg/dL — AB (ref 0.0–0.3)
Total Bilirubin: 5.7 mg/dL — ABNORMAL HIGH (ref 0.2–1.2)
Total Protein: 8.1 g/dL (ref 6.0–8.3)

## 2018-10-06 ENCOUNTER — Other Ambulatory Visit: Payer: Self-pay

## 2018-10-06 DIAGNOSIS — K7460 Unspecified cirrhosis of liver: Secondary | ICD-10-CM

## 2018-10-06 DIAGNOSIS — D61818 Other pancytopenia: Secondary | ICD-10-CM

## 2018-10-06 LAB — AFP TUMOR MARKER: AFP-Tumor Marker: 5.8 ng/mL

## 2018-10-07 ENCOUNTER — Other Ambulatory Visit: Payer: Self-pay | Admitting: Family

## 2018-10-07 ENCOUNTER — Telehealth: Payer: Self-pay | Admitting: Hematology & Oncology

## 2018-10-07 DIAGNOSIS — D649 Anemia, unspecified: Secondary | ICD-10-CM
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
## 2018-10-07 NOTE — Telephone Encounter (Signed)
Spoke with pt to confirm 3 week lab/ov/ pt stated that she would need an appt sooner due to getting a transplant. transferred patient to desk nurse line

## 2018-10-14 ENCOUNTER — Inpatient Hospital Stay (HOSPITAL_COMMUNITY)
Admission: EM | Admit: 2018-10-14 | Discharge: 2018-10-16 | DRG: 392 | Disposition: A | Discharge status: Home / Self Care | Payer: Medicaid Other | Attending: Internal Medicine | Admitting: Internal Medicine

## 2018-10-14 ENCOUNTER — Encounter (HOSPITAL_COMMUNITY): Payer: Self-pay

## 2018-10-14 ENCOUNTER — Emergency Department (HOSPITAL_COMMUNITY): Payer: Medicaid Other

## 2018-10-14 DIAGNOSIS — F1011 Alcohol abuse, in remission: Secondary | ICD-10-CM | POA: Diagnosis present

## 2018-10-14 DIAGNOSIS — E876 Hypokalemia: Secondary | ICD-10-CM | POA: Diagnosis present

## 2018-10-14 DIAGNOSIS — R1084 Generalized abdominal pain: Secondary | ICD-10-CM | POA: Diagnosis present

## 2018-10-14 DIAGNOSIS — R1114 Bilious vomiting: Secondary | ICD-10-CM

## 2018-10-14 DIAGNOSIS — K7460 Unspecified cirrhosis of liver: Secondary | ICD-10-CM

## 2018-10-14 DIAGNOSIS — G629 Polyneuropathy, unspecified: Secondary | ICD-10-CM | POA: Diagnosis present

## 2018-10-14 DIAGNOSIS — F329 Major depressive disorder, single episode, unspecified: Secondary | ICD-10-CM | POA: Diagnosis present

## 2018-10-14 DIAGNOSIS — Z720 Tobacco use: Secondary | ICD-10-CM

## 2018-10-14 DIAGNOSIS — R112 Nausea with vomiting, unspecified: Principal | ICD-10-CM | POA: Diagnosis present

## 2018-10-14 DIAGNOSIS — Z881 Allergy status to other antibiotic agents status: Secondary | ICD-10-CM

## 2018-10-14 DIAGNOSIS — Z91041 Radiographic dye allergy status: Secondary | ICD-10-CM

## 2018-10-14 DIAGNOSIS — F419 Anxiety disorder, unspecified: Secondary | ICD-10-CM | POA: Diagnosis present

## 2018-10-14 DIAGNOSIS — K861 Other chronic pancreatitis: Secondary | ICD-10-CM

## 2018-10-14 DIAGNOSIS — K7210 Chronic hepatic failure without coma: Secondary | ICD-10-CM | POA: Diagnosis present

## 2018-10-14 DIAGNOSIS — F17210 Nicotine dependence, cigarettes, uncomplicated: Secondary | ICD-10-CM | POA: Diagnosis present

## 2018-10-14 DIAGNOSIS — Z7682 Awaiting organ transplant status: Secondary | ICD-10-CM

## 2018-10-14 DIAGNOSIS — Z803 Family history of malignant neoplasm of breast: Secondary | ICD-10-CM

## 2018-10-14 DIAGNOSIS — Z8249 Family history of ischemic heart disease and other diseases of the circulatory system: Secondary | ICD-10-CM

## 2018-10-14 DIAGNOSIS — K8689 Other specified diseases of pancreas: Secondary | ICD-10-CM | POA: Diagnosis present

## 2018-10-14 DIAGNOSIS — Z885 Allergy status to narcotic agent status: Secondary | ICD-10-CM

## 2018-10-14 DIAGNOSIS — Z800 Family history of malignant neoplasm of digestive organs: Secondary | ICD-10-CM

## 2018-10-14 DIAGNOSIS — G8929 Other chronic pain: Secondary | ICD-10-CM | POA: Diagnosis present

## 2018-10-14 DIAGNOSIS — K219 Gastro-esophageal reflux disease without esophagitis: Secondary | ICD-10-CM | POA: Diagnosis present

## 2018-10-14 DIAGNOSIS — N179 Acute kidney failure, unspecified: Secondary | ICD-10-CM | POA: Diagnosis present

## 2018-10-14 DIAGNOSIS — D61818 Other pancytopenia: Secondary | ICD-10-CM | POA: Diagnosis present

## 2018-10-14 DIAGNOSIS — K7581 Nonalcoholic steatohepatitis (NASH): Secondary | ICD-10-CM | POA: Diagnosis present

## 2018-10-14 DIAGNOSIS — Z8349 Family history of other endocrine, nutritional and metabolic diseases: Secondary | ICD-10-CM

## 2018-10-14 DIAGNOSIS — R9431 Abnormal electrocardiogram [ECG] [EKG]: Secondary | ICD-10-CM | POA: Diagnosis present

## 2018-10-14 DIAGNOSIS — E739 Lactose intolerance, unspecified: Secondary | ICD-10-CM | POA: Diagnosis present

## 2018-10-14 DIAGNOSIS — E860 Dehydration: Secondary | ICD-10-CM | POA: Diagnosis present

## 2018-10-14 DIAGNOSIS — Z9049 Acquired absence of other specified parts of digestive tract: Secondary | ICD-10-CM

## 2018-10-14 DIAGNOSIS — K8681 Exocrine pancreatic insufficiency: Secondary | ICD-10-CM | POA: Diagnosis present

## 2018-10-14 DIAGNOSIS — R188 Other ascites: Secondary | ICD-10-CM

## 2018-10-14 DIAGNOSIS — E871 Hypo-osmolality and hyponatremia: Secondary | ICD-10-CM | POA: Diagnosis present

## 2018-10-14 DIAGNOSIS — Z79899 Other long term (current) drug therapy: Secondary | ICD-10-CM

## 2018-10-14 DIAGNOSIS — K7030 Alcoholic cirrhosis of liver without ascites: Secondary | ICD-10-CM | POA: Diagnosis present

## 2018-10-14 LAB — URINALYSIS, ROUTINE W REFLEX MICROSCOPIC
Bilirubin Urine: NEGATIVE
Glucose, UA: NEGATIVE mg/dL
Hgb urine dipstick: NEGATIVE
Ketones, ur: NEGATIVE mg/dL
Leukocytes, UA: NEGATIVE
Nitrite: NEGATIVE
Protein, ur: NEGATIVE mg/dL
Specific Gravity, Urine: 1.01 (ref 1.005–1.030)
pH: 6 (ref 5.0–8.0)

## 2018-10-14 LAB — COMPREHENSIVE METABOLIC PANEL
ALT: 39 U/L (ref 0–44)
AST: 89 U/L — ABNORMAL HIGH (ref 15–41)
Albumin: 4.2 g/dL (ref 3.5–5.0)
Alkaline Phosphatase: 135 U/L — ABNORMAL HIGH (ref 38–126)
Anion gap: 16 — ABNORMAL HIGH (ref 5–15)
BUN: 13 mg/dL (ref 6–20)
CHLORIDE: 96 mmol/L — AB (ref 98–111)
CO2: 22 mmol/L (ref 22–32)
Calcium: 10.1 mg/dL (ref 8.9–10.3)
Creatinine, Ser: 1.14 mg/dL — ABNORMAL HIGH (ref 0.44–1.00)
GFR calc Af Amer: >60 mL/min (ref >60)
GFR calc non Af Amer: 57 mL/min — ABNORMAL LOW (ref >60)
Glucose, Bld: 92 mg/dL (ref 70–99)
POTASSIUM: 2.8 mmol/L — AB (ref 3.5–5.1)
Sodium: 134 mmol/L — ABNORMAL LOW (ref 135–145)
Total Bilirubin: 4.6 mg/dL — ABNORMAL HIGH (ref 0.3–1.2)
Total Protein: 8.8 g/dL — ABNORMAL HIGH (ref 6.5–8.1)

## 2018-10-14 LAB — CBC
HEMATOCRIT: 26.9 % — AB (ref 36.0–46.0)
Hemoglobin: 9.2 g/dL — ABNORMAL LOW (ref 12.0–15.0)
MCH: 37.2 pg — ABNORMAL HIGH (ref 26.0–34.0)
MCHC: 34.2 g/dL (ref 30.0–36.0)
MCV: 108.9 fL — ABNORMAL HIGH (ref 80.0–100.0)
Platelets: 101 10*3/uL — ABNORMAL LOW (ref 150–400)
RBC: 2.47 MIL/uL — ABNORMAL LOW (ref 3.87–5.11)
RDW: 17.8 % — ABNORMAL HIGH (ref 11.5–15.5)
WBC: 7.5 10*3/uL (ref 4.0–10.5)
nRBC: 0 % (ref 0.0–0.2)

## 2018-10-14 LAB — PROTIME-INR
INR: 1.43
Prothrombin Time: 17.3 seconds — ABNORMAL HIGH (ref 11.4–15.2)

## 2018-10-14 LAB — LIPASE, BLOOD: Lipase: 40 U/L (ref 11–51)

## 2018-10-14 LAB — MAGNESIUM: Magnesium: 1.9 mg/dL (ref 1.7–2.4)

## 2018-10-14 LAB — AMMONIA: AMMONIA: 32 umol/L (ref 9–35)

## 2018-10-14 LAB — ETHANOL: Alcohol, Ethyl (B): <10 mg/dL (ref <10)

## 2018-10-14 MED ORDER — MELATONIN 5 MG PO TABS
10.0000 mg | ORAL_TABLET | Freq: Every evening | ORAL | Status: DC | PRN
  Administered 2018-10-15: 10 mg via ORAL
  Filled 2018-10-14 (×3): qty 2

## 2018-10-14 MED ORDER — PANCRELIPASE (LIP-PROT-AMYL) 36000-114000 UNITS PO CPEP
72000.0000 [IU] | ORAL_CAPSULE | Freq: Three times a day (TID) | ORAL | Status: DC
  Administered 2018-10-15 – 2018-10-16 (×4): 72000 [IU] via ORAL
  Filled 2018-10-14 (×5): qty 2

## 2018-10-14 MED ORDER — ONDANSETRON HCL 4 MG/2ML IJ SOLN
4.0000 mg | Freq: Four times a day (QID) | INTRAMUSCULAR | Status: DC | PRN
  Administered 2018-10-15 (×3): 4 mg via INTRAVENOUS
  Filled 2018-10-14 (×3): qty 2

## 2018-10-14 MED ORDER — OXYCODONE HCL 5 MG PO TABS
5.0000 mg | ORAL_TABLET | Freq: Four times a day (QID) | ORAL | Status: DC | PRN
  Administered 2018-10-15 – 2018-10-16 (×5): 5 mg via ORAL
  Filled 2018-10-14 (×5): qty 1

## 2018-10-14 MED ORDER — POTASSIUM CHLORIDE 10 MEQ/100ML IV SOLN
10.0000 meq | Freq: Once | INTRAVENOUS | Status: AC
  Administered 2018-10-14: 10 meq via INTRAVENOUS
  Filled 2018-10-14: qty 100

## 2018-10-14 MED ORDER — HYDROMORPHONE HCL 1 MG/ML IJ SOLN
1.0000 mg | Freq: Once | INTRAMUSCULAR | Status: AC
  Administered 2018-10-14: 1 mg via INTRAVENOUS
  Filled 2018-10-14: qty 1

## 2018-10-14 MED ORDER — SODIUM CHLORIDE 0.9 % IV BOLUS
500.0000 mL | Freq: Once | INTRAVENOUS | Status: AC
  Administered 2018-10-14: 500 mL via INTRAVENOUS

## 2018-10-14 MED ORDER — GABAPENTIN 300 MG PO CAPS
300.0000 mg | ORAL_CAPSULE | Freq: Three times a day (TID) | ORAL | Status: DC
  Administered 2018-10-15 (×4): 300 mg via ORAL
  Filled 2018-10-14 (×4): qty 1

## 2018-10-14 MED ORDER — POTASSIUM CHLORIDE IN NACL 40-0.9 MEQ/L-% IV SOLN
INTRAVENOUS | Status: DC
  Administered 2018-10-15: 100 mL/h via INTRAVENOUS
  Filled 2018-10-14: qty 1000

## 2018-10-14 MED ORDER — HYDROMORPHONE HCL 1 MG/ML IJ SOLN
1.0000 mg | INTRAMUSCULAR | Status: AC | PRN
  Administered 2018-10-15 (×2): 1 mg via INTRAVENOUS
  Filled 2018-10-14 (×2): qty 1

## 2018-10-14 MED ORDER — FOLIC ACID 1 MG PO TABS
1.0000 mg | ORAL_TABLET | Freq: Every day | ORAL | Status: DC
  Administered 2018-10-15: 1 mg via ORAL
  Filled 2018-10-14: qty 1
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
## 2018-10-14 NOTE — ED Triage Notes (Signed)
Pt complains of abdominal pain, vomiting and being nauseated for two days, hx of pancreatitis and is on the liver transplant list and has that appt on Jan 14th
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
## 2018-10-15 ENCOUNTER — Observation Stay (HOSPITAL_COMMUNITY): Payer: Medicaid Other

## 2018-10-15 ENCOUNTER — Other Ambulatory Visit: Payer: Self-pay

## 2018-10-15 DIAGNOSIS — E739 Lactose intolerance, unspecified: Secondary | ICD-10-CM | POA: Diagnosis present

## 2018-10-15 DIAGNOSIS — F419 Anxiety disorder, unspecified: Secondary | ICD-10-CM

## 2018-10-15 DIAGNOSIS — E876 Hypokalemia: Secondary | ICD-10-CM | POA: Diagnosis present

## 2018-10-15 DIAGNOSIS — K7031 Alcoholic cirrhosis of liver with ascites: Secondary | ICD-10-CM | POA: Diagnosis not present

## 2018-10-15 DIAGNOSIS — K861 Other chronic pancreatitis: Secondary | ICD-10-CM | POA: Diagnosis present

## 2018-10-15 DIAGNOSIS — R9431 Abnormal electrocardiogram [ECG] [EKG]: Secondary | ICD-10-CM

## 2018-10-15 DIAGNOSIS — E860 Dehydration: Secondary | ICD-10-CM | POA: Diagnosis present

## 2018-10-15 DIAGNOSIS — G8929 Other chronic pain: Secondary | ICD-10-CM | POA: Diagnosis present

## 2018-10-15 DIAGNOSIS — D61818 Other pancytopenia: Secondary | ICD-10-CM | POA: Diagnosis present

## 2018-10-15 DIAGNOSIS — Z79899 Other long term (current) drug therapy: Secondary | ICD-10-CM | POA: Diagnosis not present

## 2018-10-15 DIAGNOSIS — K8689 Other specified diseases of pancreas: Secondary | ICD-10-CM

## 2018-10-15 DIAGNOSIS — R1084 Generalized abdominal pain: Secondary | ICD-10-CM | POA: Diagnosis present

## 2018-10-15 DIAGNOSIS — Z7682 Awaiting organ transplant status: Secondary | ICD-10-CM | POA: Diagnosis not present

## 2018-10-15 DIAGNOSIS — E871 Hypo-osmolality and hyponatremia: Secondary | ICD-10-CM | POA: Diagnosis present

## 2018-10-15 DIAGNOSIS — Z720 Tobacco use: Secondary | ICD-10-CM

## 2018-10-15 DIAGNOSIS — N179 Acute kidney failure, unspecified: Secondary | ICD-10-CM | POA: Diagnosis present

## 2018-10-15 DIAGNOSIS — F1011 Alcohol abuse, in remission: Secondary | ICD-10-CM | POA: Diagnosis present

## 2018-10-15 DIAGNOSIS — F329 Major depressive disorder, single episode, unspecified: Secondary | ICD-10-CM

## 2018-10-15 DIAGNOSIS — R112 Nausea with vomiting, unspecified: Principal | ICD-10-CM

## 2018-10-15 DIAGNOSIS — Z91041 Radiographic dye allergy status: Secondary | ICD-10-CM | POA: Diagnosis not present

## 2018-10-15 DIAGNOSIS — K8681 Exocrine pancreatic insufficiency: Secondary | ICD-10-CM | POA: Diagnosis present

## 2018-10-15 DIAGNOSIS — F17210 Nicotine dependence, cigarettes, uncomplicated: Secondary | ICD-10-CM | POA: Diagnosis present

## 2018-10-15 DIAGNOSIS — G6289 Other specified polyneuropathies: Secondary | ICD-10-CM

## 2018-10-15 DIAGNOSIS — G629 Polyneuropathy, unspecified: Secondary | ICD-10-CM | POA: Diagnosis present

## 2018-10-15 DIAGNOSIS — K219 Gastro-esophageal reflux disease without esophagitis: Secondary | ICD-10-CM | POA: Diagnosis present

## 2018-10-15 DIAGNOSIS — K7030 Alcoholic cirrhosis of liver without ascites: Secondary | ICD-10-CM | POA: Diagnosis present

## 2018-10-15 DIAGNOSIS — K7210 Chronic hepatic failure without coma: Secondary | ICD-10-CM | POA: Diagnosis present

## 2018-10-15 DIAGNOSIS — K7581 Nonalcoholic steatohepatitis (NASH): Secondary | ICD-10-CM | POA: Diagnosis present

## 2018-10-15 LAB — CBC
HCT: 25.1 % — ABNORMAL LOW (ref 36.0–46.0)
Hemoglobin: 8.3 g/dL — ABNORMAL LOW (ref 12.0–15.0)
MCH: 36.6 pg — ABNORMAL HIGH (ref 26.0–34.0)
MCHC: 33.1 g/dL (ref 30.0–36.0)
MCV: 110.6 fL — ABNORMAL HIGH (ref 80.0–100.0)
PLATELETS: 91 10*3/uL — AB (ref 150–400)
RBC: 2.27 MIL/uL — ABNORMAL LOW (ref 3.87–5.11)
RDW: 18.4 % — AB (ref 11.5–15.5)
WBC: 5 10*3/uL (ref 4.0–10.5)
nRBC: 0 % (ref 0.0–0.2)

## 2018-10-15 LAB — HEPATIC FUNCTION PANEL
ALT: 36 U/L (ref 0–44)
AST: 83 U/L — ABNORMAL HIGH (ref 15–41)
Albumin: 3.6 g/dL (ref 3.5–5.0)
Alkaline Phosphatase: 115 U/L (ref 38–126)
BILIRUBIN INDIRECT: 2.8 mg/dL — AB (ref 0.3–0.9)
Bilirubin, Direct: 1 mg/dL — ABNORMAL HIGH (ref 0.0–0.2)
Total Bilirubin: 3.8 mg/dL — ABNORMAL HIGH (ref 0.3–1.2)
Total Protein: 7.7 g/dL (ref 6.5–8.1)

## 2018-10-15 LAB — BASIC METABOLIC PANEL
Anion gap: 9 (ref 5–15)
BUN: 10 mg/dL (ref 6–20)
CALCIUM: 8.9 mg/dL (ref 8.9–10.3)
CO2: 23 mmol/L (ref 22–32)
CREATININE: 0.84 mg/dL (ref 0.44–1.00)
Chloride: 105 mmol/L (ref 98–111)
GFR calc Af Amer: >60 mL/min (ref >60)
GFR calc non Af Amer: >60 mL/min (ref >60)
Glucose, Bld: 93 mg/dL (ref 70–99)
Potassium: 4.8 mmol/L (ref 3.5–5.1)
Sodium: 137 mmol/L (ref 135–145)

## 2018-10-15 MED ORDER — POTASSIUM CHLORIDE 10 MEQ/100ML IV SOLN
10.0000 meq | INTRAVENOUS | Status: AC
  Administered 2018-10-15 (×3): 10 meq via INTRAVENOUS
  Filled 2018-10-15 (×3): qty 100

## 2018-10-15 MED ORDER — ENSURE ENLIVE PO LIQD
237.0000 mL | Freq: Two times a day (BID) | ORAL | Status: DC
  Administered 2018-10-15: 237 mL via ORAL

## 2018-10-15 MED ORDER — PROSIGHT PO TABS
1.0000 | ORAL_TABLET | Freq: Every day | ORAL | Status: DC
  Administered 2018-10-15: 1 via ORAL

## 2018-10-15 NOTE — Progress Notes (Signed)
Patient ID: Jamie Allen, female   DOB: [birth year], 47 y.o.   MRN: [id number] Patient presented to ultrasound apartment today for paracentesis.  On limited ultrasound abdomen in all 4 quadrants there is no significant ascites present.  Procedure canceled.  Patient informed.

## 2018-10-15 NOTE — Progress Notes (Signed)
Patient ID: Jamie Allen, female   DOB: 04-17-1971, 47 y.o.   MRN: [id number]  PROGRESS NOTE    Jamie Allen  YYQ:[id number] DOB: Mar 09, 1971 DOA: 10/14/2018 PCP: Jeanella Anton, NP   Brief Narrative:  47 year old female with history of cirrhosis secondary to alcohol use and NASH, cholecystitis status post cholecystectomy, pancreatic insufficiency, chronic anemia and thrombocytopenia, depression/anxiety, chronic pain/neuropathy, current tobacco use and recent hospitalization from 09/25/2018-12020-04-2618 for abdominal pain and dilated CBD on CT scan for which he had subsequent MRCP which ruled out any obstruction and patient underwent paracentesis and GI evaluation and was discharged home.  She presented with nausea, vomiting and abdominal pain.  She was started on intravenous fluids.   Assessment & Plan:   Principal Problem:   Nausea and vomiting Active Problems:   Cirrhosis (HCC)   Anxiety and depression   Pancytopenia ([facility])   Peripheral neuropathy   Tobacco use   Hypokalemia   Pancreatic insufficiency   Prolonged Q-T interval on ECG   Abdominal pain, nausea and vomiting along with dehydration -Abdominal x-ray was negative for free air or obstruction.  Right upper quadrant ultrasound showed cirrhosis, no acute abnormalities  -Started on IV fluids. -Symptoms are improving.  Continue pain management  History of decompensated cirrhosis of liver secondary to alcohol use/NASH with elevated LFTs and chronic anemia and thrombocytopenia -Patient underwent paracentesis during last admission on 09/26/2018 with removal of 1.5 L fluid and the fluid was not consistent with SBP -We will request ultrasound-guided paracentesis again has felt that her abdomen is more distended recently. -Diuretics were held on admission.  Will probably restart diuretics tomorrow or on discharge -Outpatient follow-up with GI and liver transplant center  Hypokalemia and hyponatremia -Secondary to  dehydration.  Improved with IV fluids and replacement.  Repeat a.m. labs  QT prolongation -Monitor.  Chronic pancreatic insufficiency -We will resume Creon  Anxiety/depression -Sertraline held because of prolonged QT.  Will check QT before restarting sertraline  Chronic pain and neuropathy -Continue gabapentin along with as needed oxycodone and intravenous Dilaudid if needed  Tobacco abuse -Patient was counseled by the admitting hospitalist  DVT prophylaxis: SCDs Code Status: Partial Family Communication: None at bedside Disposition Plan: Home in 1 to 2 days once clinically improved  Consultants: None  Procedures: None  Antimicrobials: None   Subjective: Patient seen and examined at bedside.  Abdominal pain is slightly better.  Currently not nauseous.  She has not had anything to eat yet though.  No overnight fever or vomiting. Objective: Vitals:   10/15/18 0007 10/15/18 0008 10/15/18 0517 10/15/18 0900  BP: 123/82 135/66 (!) 129/53   Pulse: 80 83 81   Resp: (!) 22 (!) 24 (!) 21   Temp:   97.7 F (36.5 C)   TempSrc:   Oral   SpO2: 100% 100% 97%   Weight:    47.2 kg  Height:    5' 2"  (1.575 m)    Intake/Output Summary (Last 24 hours) at 10/15/2018 1115 Last data filed at 10/15/2018 0319 Gross per 24 hour  Intake 500 ml  Output 1700 ml  Net -1200 ml   Filed Weights   10/15/18 0900  Weight: 47.2 kg    Examination:  General exam: Appears calm and comfortable.  Looks older than stated age Respiratory system: Bilateral decreased breath sounds at bases Cardiovascular system: S1 & S2 heard, Rate controlled Gastrointestinal system: Abdomen is nondistended, soft and mildly tender in the periumbilical region and lower quadrant. Normal bowel sounds heard. Extremities:  No cyanosis, clubbing, edema      Data Reviewed: I have personally reviewed following labs and imaging studies  CBC: Recent Labs  Lab 10/14/18 2059 10/15/18 0546  WBC 7.5 5.0  HGB 9.2*  8.3*  HCT 26.9* 25.1*  MCV 108.9* 110.6*  PLT 101* 91*   Basic Metabolic Panel: Recent Labs  Lab 10/14/18 2059 10/14/18 2304 10/15/18 0546  NA 134*  --  137  K 2.8*  --  4.8  CL 96*  --  105  CO2 22  --  23  GLUCOSE 92  --  93  BUN 13  --  10  CREATININE 1.14*  --  0.84  CALCIUM 10.1  --  8.9  MG  --  1.9  --    GFR: Estimated Creatinine Clearance: 61 mL/min (by C-G formula based on SCr of 0.84 mg/dL). Liver Function Tests: Recent Labs  Lab 10/14/18 2059 10/15/18 0546  AST 89* 83*  ALT 39 36  ALKPHOS 135* 115  BILITOT 4.6* 3.8*  PROT 8.8* 7.7  ALBUMIN 4.2 3.6   Recent Labs  Lab 10/14/18 2059  LIPASE 40   Recent Labs  Lab 10/14/18 2257  AMMONIA 32   Coagulation Profile: Recent Labs  Lab 10/14/18 2257  INR 1.43   Cardiac Enzymes: No results for input(s): CKTOTAL, CKMB, CKMBINDEX, TROPONINI in the last 168 hours. BNP (last 3 results) No results for input(s): PROBNP in the last 8760 hours. HbA1C: No results for input(s): HGBA1C in the last 72 hours. CBG: No results for input(s): GLUCAP in the last 168 hours. Lipid Profile: No results for input(s): CHOL, HDL, LDLCALC, TRIG, CHOLHDL, LDLDIRECT in the last 72 hours. Thyroid Function Tests: No results for input(s): TSH, T4TOTAL, FREET4, T3FREE, THYROIDAB in the last 72 hours. Anemia Panel: No results for input(s): VITAMINB12, FOLATE, FERRITIN, TIBC, IRON, RETICCTPCT in the last 72 hours. Sepsis Labs: No results for input(s): PROCALCITON, LATICACIDVEN in the last 168 hours.  No results found for this or any previous visit (from the past 240 hour(s)).       Radiology Studies: Dg Abdomen 1 View  Result Date: 10/14/2018 CLINICAL DATA:  Pancreatitis with vomiting and abdominal pain. EXAM: ABDOMEN - 1 VIEW COMPARISON:  CT 09/25/2018 FINDINGS: Prominent splenic shadow consistent with splenomegaly. Nonspecific scattered air containing small and large bowel loops without obstruction. Cholecystectomy clips  are present. There is no free air. No acute osseous abnormality. Gas is noted within the rectum. IMPRESSION: Splenomegaly is identified. Nonobstructive bowel gas pattern.  No free air. Electronically Signed   By: Ashley Royalty M.D.   On: 10/14/2018 22:03   US Abdomen Limited Ruq  Result Date: 10/15/2018 CLINICAL DATA:  Cirrhosis, prior cholecystectomy EXAM: ULTRASOUND ABDOMEN LIMITED RIGHT UPPER QUADRANT COMPARISON:  MRI abdomen dated 09/27/2018 FINDINGS: Gallbladder: Surgically absent. Common bile duct: Diameter: 10 mm. Liver: Hyperechoic hepatic parenchyma with coarse hepatic echotexture and a nodular hepatic contour. No focal hepatic lesion is seen. Portal vein is patent on color Doppler imaging with normal direction of blood flow towards the liver. IMPRESSION: Cirrhosis. Superimposed hepatic steatosis is not excluded. No focal hepatic lesion is seen. Status post cholecystectomy. Dilated common duct, measuring 10 mm, likely postsurgical. Electronically Signed   By: Julian Hy M.D.   On: 10/15/2018 07:52        Scheduled Meds: . folic acid  1 mg Oral Daily  . gabapentin  300 mg Oral TID  . lipase/protease/amylase  72,000 Units Oral TID AC   Continuous Infusions:  LOS: 0 days        Aline August, MD [facility] Pager [phone]  If 7PM-7AM, please contact night-coverage [email or web address] Password [email or web address] 10/15/2018, 11:15 AM

## 2018-10-15 NOTE — Progress Notes (Signed)
Initial Nutrition Assessment  DOCUMENTATION CODES:   Underweight, Non-severe (moderate) malnutrition in context of chronic illness  INTERVENTION:  Ensure Enlive po BID, each supplement provides 350 kcal and 20 grams of protein  MVI  NUTRITION DIAGNOSIS:   Moderate Malnutrition related to chronic illness(cirrhosis; pancreatic insufficiency) as evidenced by moderate fat depletion, mild muscle depletion, energy intake < 75% for > or equal to 1 month.    GOAL:   Patient will meet greater than or equal to 90% of their needs   MONITOR:   PO intake, Diet advancement, Labs, I & O's, Weight trends  REASON FOR ASSESSMENT:   Malnutrition Screening Tool    ASSESSMENT:  47 year old patient with medical history significant for cirrhosis (NASH, EtOH abuse) cholecystitis s/p cholecystectomy, pancreatic insufficiency, chronic anemia, thrombocytopenia, neuropathy presented to ED for persistant n/v and decreased urine output  Patient is currently NPO at RD visit for possible paracentesis. Very pleasant patient who reports she will be receiving a liver transplant in January and meeting the team on January 14th to discuss plan/schedule surgery.   Patient reports taking Creon at home and stated that she often forgets to take it until after she has eaten. RD educated patient on better digestion if she takes it just before meals. RD suggested that she think of the Creon as her salt and pepper to help remember to take it prior to eating.  Pt reports issues with swallowing on occasion. She is missing both top and bottom and stated she is waiting to get fitted for dentures. 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 Per MD note, patient stated that she is undergoing liver transplant evaluation as her sister is a Orthoptist and willing to donate.  Medications reviewed and include: folic acid, gabapentin, creon 72000 units with meals, dilaudid, melatonin, zofran, oxycodone  Labs:  12/26 hyponatremia, hypokalemia - resolved  NUTRITION - FOCUSED PHYSICAL EXAM:    Most Recent Value  Orbital Region  Moderate depletion  Upper Arm Region  Mild depletion  Thoracic and Lumbar Region  Mild depletion  Buccal Region  Moderate depletion  Temple Region  Moderate depletion  Clavicle Bone Region  Mild depletion  Clavicle and Acromion Bone Region  Mild depletion  Scapular Bone Region  Mild depletion  Dorsal Hand  Mild depletion  Patellar Region  Moderate depletion  Anterior Thigh Region  Mild depletion  Posterior Calf Region  Mild depletion  Edema (RD Assessment)  None  Hair  Reviewed  Eyes  Reviewed  Mouth  Reviewed [missing both top/bottom]  Skin  Reviewed  Nails  Reviewed       Diet Order:   Diet Order            Diet Heart Room service appropriate? Yes; Fluid consistency: Thin; Fluid restriction: 1200 mL Fluid  Diet effective now              EDUCATION NEEDS:   Education needs have been addressed  Skin:  Skin Assessment: Reviewed RN Assessment(jaundice)  Last BM:  10/12/2018  Height:   Ht Readings from Last 1 Encounters:  10/15/18 5\' 2"  (1.575 m)    Weight:   Wt Readings from Last 1 Encounters:  10/15/18 47.2 kg    Ideal Body Weight:  50 kg  BMI:  Body mass index is 19.02 kg/m.  Estimated Nutritional Needs:   Kcal:  1420-1650 (30-35k/kg)  Protein:  66-77 g  Fluid:  1.2L/day per MD    Lajuan Lines, RD, LDN  After Hours/Weekend Pager: [phone]

## 2018-10-16 DIAGNOSIS — E876 Hypokalemia: Secondary | ICD-10-CM

## 2018-10-16 LAB — COMPREHENSIVE METABOLIC PANEL
ALT: 35 U/L (ref 0–44)
AST: 84 U/L — ABNORMAL HIGH (ref 15–41)
Albumin: 3.6 g/dL (ref 3.5–5.0)
Alkaline Phosphatase: 115 U/L (ref 38–126)
Anion gap: 11 (ref 5–15)
BUN: 8 mg/dL (ref 6–20)
CO2: 20 mmol/L — ABNORMAL LOW (ref 22–32)
Calcium: 9.5 mg/dL (ref 8.9–10.3)
Chloride: 102 mmol/L (ref 98–111)
Creatinine, Ser: 0.73 mg/dL (ref 0.44–1.00)
GFR calc Af Amer: >60 mL/min (ref >60)
GFR calc non Af Amer: >60 mL/min (ref >60)
Glucose, Bld: 103 mg/dL — ABNORMAL HIGH (ref 70–99)
Potassium: 4.4 mmol/L (ref 3.5–5.1)
Sodium: 133 mmol/L — ABNORMAL LOW (ref 135–145)
Total Bilirubin: 5.5 mg/dL — ABNORMAL HIGH (ref 0.3–1.2)
Total Protein: 7.6 g/dL (ref 6.5–8.1)

## 2018-10-16 LAB — CBC WITH DIFFERENTIAL/PLATELET
Abs Immature Granulocytes: 0.01 10*3/uL (ref 0.00–0.07)
Basophils Absolute: 0.1 10*3/uL (ref 0.0–0.1)
Basophils Relative: 2 %
Eosinophils Absolute: 0.4 10*3/uL (ref 0.0–0.5)
Eosinophils Relative: 12 %
HCT: 24.5 % — ABNORMAL LOW (ref 36.0–46.0)
Hemoglobin: 8.1 g/dL — ABNORMAL LOW (ref 12.0–15.0)
Immature Granulocytes: 0 %
Lymphocytes Relative: 21 %
Lymphs Abs: 0.7 10*3/uL (ref 0.7–4.0)
MCH: 36.8 pg — AB (ref 26.0–34.0)
MCHC: 33.1 g/dL (ref 30.0–36.0)
MCV: 111.4 fL — ABNORMAL HIGH (ref 80.0–100.0)
MONOS PCT: 9 %
Monocytes Absolute: 0.3 10*3/uL (ref 0.1–1.0)
Neutro Abs: 1.8 10*3/uL (ref 1.7–7.7)
Neutrophils Relative %: 56 %
Platelets: 83 10*3/uL — ABNORMAL LOW (ref 150–400)
RBC: 2.2 MIL/uL — ABNORMAL LOW (ref 3.87–5.11)
RDW: 17.8 % — ABNORMAL HIGH (ref 11.5–15.5)
WBC: 3.3 10*3/uL — ABNORMAL LOW (ref 4.0–10.5)
nRBC: 0 % (ref 0.0–0.2)

## 2018-10-16 LAB — MAGNESIUM: Magnesium: 1.8 mg/dL (ref 1.7–2.4)

## 2018-10-16 MED ORDER — ESOMEPRAZOLE MAGNESIUM 20 MG PO CPDR: 20.0000 mg | DELAYED_RELEASE_CAPSULE | Freq: Every day | ORAL | Status: AC | PRN

## 2018-10-16 MED ORDER — DIPHENOXYLATE-ATROPINE 2.5-0.025 MG PO TABS: 1.0000 | ORAL_TABLET | Freq: Two times a day (BID) | ORAL | Status: DC | PRN

## 2018-10-16 NOTE — Progress Notes (Signed)
Pt leaving at this time with her brother. Alert, oriented, and without c/o. Discharge instructions given/explained with pt verbalizing understanding. Pt, and her brother, aware of followup appointments.

## 2018-10-16 NOTE — Discharge Summary (Signed)
Physician Discharge Summary  Jamie Allen UXN:[id number] DOB: 1970/11/15 DOA: 10/14/2018  PCP: Jeanella Anton, NP  Admit date: 10/14/2018 Discharge date: 10/16/2018  Admitted From: Home Disposition: Home  Recommendations for Outpatient Follow-up:  1. Follow up with PCP in 1week with repeat CBC/BMP 2. Follow-up with gastroenterology/liver transplant physician as scheduled 3. 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  She presented with nausea, vomiting and abdominal pain.  She was started on intravenous fluids. During the hospitalization, her condition has improved.  There was no ascitic fluid to be tapped.  She feels much better and wants to go home.  Her diuretics will be restarted.  Discharge Diagnoses:  Principal Problem:   Nausea and vomiting Active Problems:   Cirrhosis (HCC)   Anxiety and depression   Pancytopenia (HCC)   Peripheral neuropathy   Tobacco use   Hypokalemia   Pancreatic insufficiency   Prolonged Q-T interval on ECG   Abdominal pain, nausea and vomiting along with dehydration -Abdominal x-ray was negative for free air or obstruction.  Right upper quadrant ultrasound showed cirrhosis, no acute abnormalities  -Started on IV fluids. -Symptoms have much improved.   Tolerating diet.  Discharge home with outpatient follow-up with PCP and gastroenterologist.  History of decompensated cirrhosis of liver secondary to alcohol use/NASH with elevated LFTs and chronic anemia and thrombocytopenia -Patient underwent paracentesis during last admission on 09/26/2018 with removal of 1.5 L fluid and the fluid was not consistent with SBP -Ultrasound done during this admission did not show any significant fluid so paracentesis could not be performed -Diuretics were held on admission.    Restart diuretics on discharge.   -Outpatient follow-up with GI and liver transplant center  Hypokalemia and hyponatremia -Secondary to dehydration.  Improved with IV fluids and replacement.  Outpatient follow-up  QT prolongation -  Outpatient follow-up  Chronic pancreatic insufficiency -Continue Creon  Anxiety/depression -Resume sertraline on discharge.  Outpatient follow-up  Chronic pain and neuropathy -Continue home regimen  Tobacco abuse -Patient was counseled by the admitting hospitalist   Discharge Instructions  Discharge Instructions    Call MD for:  difficulty breathing, headache or visual disturbances   Complete by:  As directed    Call MD for:  extreme fatigue   Complete by:  As directed    Call MD for:  hives   Complete by:  As directed    Call MD for:  persistant dizziness or light-headedness   Complete by:  As directed    Call MD for:  persistant nausea and vomiting   Complete by:  As directed    Call MD for:  severe uncontrolled pain   Complete by:  As directed    Call MD for:  temperature >100.4   Complete by:  As directed    Diet - low sodium heart healthy   Complete by:  As directed    Increase activity slowly   Complete by:  As directed  Allergies as of 10/16/2018      Reactions   Iohexol Hives, Itching, Swelling   Lactose Intolerance (gi) Diarrhea, Nausea Only   Other Other (See Comments)   Spicy foods "tear up my stomach"    Vancomycin Itching   Nose itching   Morphine And Related Itching, Other (See Comments)   Can tolerate with Benadryl      Medication List    STOP taking these medications   metoCLOPramide 10 MG tablet Commonly known as:  REGLAN     TAKE these medications   diphenoxylate-atropine 2.5-0.025 MG tablet Commonly known as:  LOMOTIL Take 1 tablet by mouth 2 (two) times daily as needed for diarrhea or loose stools.   esomeprazole 20 MG capsule Commonly known as:  NEXIUM Take 1 capsule (20 mg total) by mouth daily as needed (indigestion).   folic acid 1 MG tablet Commonly known as:  FOLVITE Take 1 tablet (1 mg total) by mouth daily.   furosemide 40 MG tablet Commonly known as:  LASIX Take 1 tablet (40 mg total) by mouth daily.   gabapentin 300 MG capsule Commonly known as:  NEURONTIN Take 1 capsule (300 mg total) by mouth 3 (three) times daily.   lipase/protease/amylase 36000 UNITS Cpep capsule Commonly known as:  CREON Take 2 capsules (72,000 Units total) by mouth 3 (three) times daily before meals.   Melatonin 10 MG Tabs Take 10 mg by mouth at bedtime as needed (for sleep).   MIDOL COMPLETE 500-60-15 MG Tabs Generic drug:  Acetaminophen-Caff-Pyrilamine Take 2 tablets by mouth daily as needed (cramping).   NARCAN 4 MG/0.1ML Liqd nasal spray kit Generic drug:  naloxone Place 0.1 mg into the nose daily as needed (accidental overdose).   ondansetron 4 MG disintegrating tablet Commonly known as:  ZOFRAN ODT Take 1 tablet (4 mg total) by mouth every 8 (eight) hours as needed for nausea or vomiting.   oxyCODONE 5 MG immediate release tablet Commonly known as:  Oxy IR/ROXICODONE Take 1 tablet (5 mg total) by mouth every 6 (six) hours as needed for moderate pain.   promethazine 25 MG suppository Commonly known as:  PHENERGAN Place 1 suppository (25 mg total) rectally every 6 (six) hours as needed for nausea or vomiting.   sertraline 50 MG tablet Commonly known as:   ZOLOFT Take 1 tablet (50 mg total) by mouth daily.   spironolactone 100 MG tablet Commonly known as:  ALDACTONE Take 1 tablet (100 mg total) by mouth daily.   sucralfate 1 GM/10ML suspension Commonly known as:  CARAFATE Take 10 mLs (1 g total) by mouth 4 (four) times daily -  with meals and at bedtime.   zinc sulfate 220 (50 Zn) MG capsule Take 1 capsule (220 mg total) by mouth daily.      Follow-up Information    Jeanella Anton, NP. Schedule an appointment as soon as possible for a visit in 1 week(s).   Specialty:  Nurse Practitioner Why:  with cbc/bmp Contact information: [place] [address] [phone]        Skeet Latch, MD .   Specialty:  Cardiology Contact information: [address] [place] [address] [phone]          Allergies  Allergen Reactions  . Iohexol Hives, Itching and Swelling  . Lactose Intolerance (Gi) Diarrhea and Nausea Only  . Other Other (See Comments)    Spicy foods "tear up my stomach"  . Vancomycin Itching    Nose itching  .  Morphine And Related Itching and Other (See Comments)    Can tolerate with Benadryl    Consultations:  None   Procedures/Studies: Ct Abdomen Pelvis Wo Contrast  Result Date: 09/25/2018 CLINICAL DATA:  Abdominal distension EXAM: CT ABDOMEN AND PELVIS WITHOUT CONTRAST TECHNIQUE: Multidetector CT imaging of the abdomen and pelvis was performed following the standard protocol without IV contrast. COMPARISON:  CT 07/15/18 FINDINGS: Lower chest: Lung bases are clear. Hepatobiliary: No focal hepatic lesion on noncontrast exam. Postcholecystectomy. Caudate lobe is enlarged. Interval increase in intraperitoneal free fluid along the margin RIGHT hepatic lobe. Common bile duct is prominent measuring 14 mm through the pancreatic head which is increased from 5 mm on comparison CT. No obstructing lesion identified. Pancreatic head appear normal on contrast CT 07/15/2018  Pancreas: Pancreas is normal. No ductal dilatation. No pancreatic inflammation. Spleen: Spleen is enlarged similar comparison exam spleen measures 18 cm in craniocaudad dimension Adrenals/urinary tract: Adrenal glands and kidneys are normal. The ureters and bladder normal. Stomach/Bowel: Stomach, small bowel, appendix, and cecum are normal. The colon and rectosigmoid colon are normal. Vascular/Lymphatic: Abdominal aorta is normal caliber. No periportal or retroperitoneal adenopathy. No pelvic adenopathy. Reproductive: Uterus and ovaries normal. Other: Moderate large volume intraperitoneal free fluid increased from comparison exam. Musculoskeletal: No aggressive osseous lesion. IMPRESSION: 1. New moderate volume intraperitoneal free fluid. Presumably ascites from portal hypertension and cirrhosis. 2. New dilatation of the common hepatic duct and common bile duct through the pancreatic head. No obstructing lesion identified. No lesion within the pancreatic head on CT 07/15/2018. Unclear etiology of dilatation. Consider ERCP with elevated bilirubin. 3. Cirrhotic liver morphology and splenomegaly. Electronically Signed   By: Suzy Bouchard M.D.   On: 09/25/2018 22:46   Dg Abdomen 1 View  Result Date: 10/14/2018 CLINICAL DATA:  Pancreatitis with vomiting and abdominal pain. EXAM: ABDOMEN - 1 VIEW COMPARISON:  CT 09/25/2018 FINDINGS: Prominent splenic shadow consistent with splenomegaly. Nonspecific scattered air containing small and large bowel loops without obstruction. Cholecystectomy clips are present. There is no free air. No acute osseous abnormality. Gas is noted within the rectum. IMPRESSION: Splenomegaly is identified. Nonobstructive bowel gas pattern.  No free air. Electronically Signed   By: Ashley Royalty M.D.   On: 10/14/2018 22:03   Mr 3d Recon At Scanner  Result Date: 110-Oct-202019 CLINICAL DATA:  Abnormal liver function tests. Ascites. Dilated extrahepatic biliary system. EXAM: MRI ABDOMEN WITHOUT  AND WITH CONTRAST (INCLUDING MRCP) TECHNIQUE: Multiplanar multisequence MR imaging of the abdomen was performed both before and after the administration of intravenous contrast. Heavily T2-weighted images of the biliary and pancreatic ducts were obtained, and three-dimensional MRCP images were rendered by post processing. CONTRAST:  6 cc Gadavist COMPARISON:  Multiple exams, including 09/25/2018 CT and 09/26/2018 ultrasound FINDINGS: Lower chest: Upper normal heart size. Hepatobiliary: Hepatic morphology and lobularity favor cirrhosis. Prior cholecystectomy. Extrahepatic biliary dilatation noted with the common hepatic duct 1.2 cm in the common bile duct at 1.0 cm, with distal tapering of the CBD in the vicinity of the ampulla and no obvious filling defect. Very minimal blunting and lack of conical tapering of the distal CBD for example on image 23/12. Unfortunately on all post-contrast series, the top half of the liver is severely obscured by some sort of boundary artifact causing an iris type of affect with abnormal low signal intensity bands through the liver, and much of the liver completely obscured. As result, sensitivity and specificity for lesions in the upper liver including hepatocellular carcinoma is markedly reduced.  We are happy to repeat the exam given this unexpected and unusual artifact causing severe limitation. None of the other sequences were affected by this artifact, only the postcontrast axials. The post-contrast coronal images were not affected. Pancreas: Unremarkable. No pancreatic mass is observed. Dorsal pancreatic duct unremarkable. Spleen: The spleen measures 19.0 by 13.9 by 7.4 cm (volume = 1000 cm^3). No focal splenic lesion identified. Adrenals/Urinary Tract: Adrenal glands unremarkable. Left renal atrophy. Scarring of the left kidney upper pole. Stomach/Bowel: No appreciable ampullary mass. Vascular/Lymphatic: The splenic vein is patent. 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 Aortoiliac atherosclerotic vascular disease. Other:  Diffuse ascites.  Subcutaneous and mesenteric edema. Musculoskeletal: Unremarkable IMPRESSION: 1. Hepatic cirrhosis. Marked splenomegaly with splenorenal shunting noted. No splenic vein thrombosis. 2. Notable third spacing of fluid with ascites, subcutaneous edema, mesenteric edema. 3. Scarring in the left kidney upper pole. 4. Extrahepatic biliary dilatation with the CBD a 1.0 cm. Much of this may be a physiologic response to cholecystectomy. I do not see a definite filling defect or cause for obstruction of the CBD. The patient has known elevated total bilirubin. Assessment of direct and indirect bilirubin levels may help in further assessment of cause. Clinically warranted, a nuclear medicine hepatobiliary scan can assess for hepatocellular dysfunction. 5. 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 6.  Aortic Atherosclerosis (ICD10-I70.0). 7. Please note that on today's exam, an unusual boundary artifact obscures the upper half of the liver on the post-contrast axial sequences. The cause of this artifact is uncertain in this case, but given that this is a technical issue we are happy to repeat the scan and contrast bolus to reassess the upper portion of the liver. Sensitivity for certain types of lesions in the upper liver may be reduced due to this artifact. Electronically Signed   By: Van Clines M.D.   On: 125-Nov-202019 07:52   US Abdomen Limited  Result Date: 10/15/2018 CLINICAL DATA:  Evaluate for ascites. EXAM: LIMITED ABDOMEN ULTRASOUND FOR ASCITES TECHNIQUE: Limited ultrasound survey for ascites was performed in all four abdominal quadrants. COMPARISON:  Ultrasound obtained earlier today, 10/15/2018 at 7:33 a.m. FINDINGS: No ascites visualized in the 4 quadrants of the abdomen. IMPRESSION: No ascites. Electronically Signed   By: Lajean Manes M.D.   On: 10/15/2018 11:56   US  Paracentesis  Result Date: 09/26/2018 INDICATION: Patient with history of ETOH + NASH cirrhosis who presented to [facility] ED due to abdominal pain, distention and nausea. Request has been made for diagnostic and therapeutic paracentesis. EXAM: ULTRASOUND GUIDED DIAGNOSTIC AND THERAPEUTIC PARACENTESIS MEDICATIONS: 10 ml 1% lidocaine COMPLICATIONS: None immediate. PROCEDURE: Informed written consent was obtained from the patient after a discussion of the risks, benefits and alternatives to treatment. A timeout was performed prior to the initiation of the procedure. Initial ultrasound scanning demonstrates a moderate amount of ascites within the left lower abdominal quadrant. The left lower abdomen was prepped and draped in the usual sterile fashion. 1% lidocaine was used for local anesthesia. Following this, a 6 Fr Safe-T-Centesis catheter was introduced. An ultrasound image was saved for documentation purposes. The paracentesis was performed. The catheter was removed and a dressing was applied. The patient tolerated the procedure well without immediate post procedural complication. FINDINGS: A total of approximately 1.5 L of clear yellow fluid was removed. Samples were sent to the laboratory as requested by the clinical team. IMPRESSION: Successful ultrasound-guided paracentesis yielding 1.5 liters of peritoneal fluid. Read by Candiss Norse, PA-C Electronically Signed  By: Aletta Edouard M.D.   On: 09/26/2018 15:12   Mr Abdomen Mrcp Moise Boring Contast  Result Date: 111/23/202019 CLINICAL DATA:  Abnormal liver function tests. Ascites. Dilated extrahepatic biliary system. EXAM: MRI ABDOMEN WITHOUT AND WITH CONTRAST (INCLUDING MRCP) TECHNIQUE: Multiplanar multisequence MR imaging of the abdomen was performed both before and after the administration of intravenous contrast. Heavily T2-weighted images of the biliary and pancreatic ducts were obtained, and three-dimensional MRCP images were rendered by post processing.  CONTRAST:  6 cc Gadavist COMPARISON:  Multiple exams, including 09/25/2018 CT and 09/26/2018 ultrasound FINDINGS: Lower chest: Upper normal heart size. Hepatobiliary: Hepatic morphology and lobularity favor cirrhosis. Prior cholecystectomy. Extrahepatic biliary dilatation noted with the common hepatic duct 1.2 cm in the common bile duct at 1.0 cm, with distal tapering of the CBD in the vicinity of the ampulla and no obvious filling defect. Very minimal blunting and lack of conical tapering of the distal CBD for example on image 23/12. Unfortunately on all post-contrast series, the top half of the liver is severely obscured by some sort of boundary artifact causing an iris type of affect with abnormal low signal intensity bands through the liver, and much of the liver completely obscured. As result, sensitivity and specificity for lesions in the upper liver including hepatocellular carcinoma is markedly reduced. We are happy to repeat the exam given this unexpected and unusual artifact causing severe limitation. None of the other sequences were affected by this artifact, only the postcontrast axials. The post-contrast coronal images were not affected. Pancreas: Unremarkable. No pancreatic mass is observed. Dorsal pancreatic duct unremarkable. Spleen: The spleen measures 19.0 by 13.9 by 7.4 cm (volume = 1000 cm^3). No focal splenic lesion identified. Adrenals/Urinary Tract: Adrenal glands unremarkable. Left renal atrophy. Scarring of the left kidney upper pole. Stomach/Bowel: No appreciable ampullary mass. Vascular/Lymphatic: The splenic vein is patent. 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 Aortoiliac atherosclerotic vascular disease. Other:  Diffuse ascites.  Subcutaneous and mesenteric edema. Musculoskeletal: Unremarkable IMPRESSION: 1. Hepatic cirrhosis. Marked splenomegaly with splenorenal shunting noted. No splenic vein thrombosis. 2. Notable third spacing of fluid with ascites,  subcutaneous edema, mesenteric edema. 3. Scarring in the left kidney upper pole. 4. Extrahepatic biliary dilatation with the CBD a 1.0 cm. Much of this may be a physiologic response to cholecystectomy. I do not see a definite filling defect or cause for obstruction of the CBD. The patient has known elevated total bilirubin. Assessment of direct and indirect bilirubin levels may help in further assessment of cause. Clinically warranted, a nuclear medicine hepatobiliary scan can assess for hepatocellular dysfunction. 5. 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 6.  Aortic Atherosclerosis (ICD10-I70.0). 7. Please note that on today's exam, an unusual boundary artifact obscures the upper half of the liver on the post-contrast axial sequences. The cause of this artifact is uncertain in this case, but given that this is a technical issue we are happy to repeat the scan and contrast bolus to reassess the upper portion of the liver. Sensitivity for certain types of lesions in the upper liver may be reduced due to this artifact. Electronically Signed   By: Van Clines M.D.   On: 111/23/202019 07:52   US Abdomen Limited Ruq  Result Date: 10/15/2018 CLINICAL DATA:  Cirrhosis, prior cholecystectomy EXAM: ULTRASOUND ABDOMEN LIMITED RIGHT UPPER QUADRANT COMPARISON:  MRI abdomen dated 09/27/2018 FINDINGS: Gallbladder: Surgically absent. Common bile duct: Diameter: 10 mm. Liver: Hyperechoic hepatic parenchyma with coarse hepatic echotexture and a  nodular hepatic contour. No focal hepatic lesion is seen. Portal vein is patent on color Doppler imaging with normal direction of blood flow towards the liver. IMPRESSION: Cirrhosis. Superimposed hepatic steatosis is not excluded. No focal hepatic lesion is seen. Status post cholecystectomy. Dilated common duct, measuring 10 mm, likely postsurgical. Electronically Signed   By: Julian Hy M.D.   On: 10/15/2018 07:52   US Abdomen Limited  Ruq  Result Date: 09/26/2018 CLINICAL DATA:  Cirrhosis and ascites on CT scan. Biliary dilatation. EXAM: ULTRASOUND ABDOMEN LIMITED RIGHT UPPER QUADRANT COMPARISON:  CT scan 09/25/2018. FINDINGS: Gallbladder: Surgically absent. Common bile duct: Diameter: Dilated to 10.4 mm diameter. Liver: Heterogeneous echotexture with subtle nodularity of contour. No focal intraparenchymal abnormality. Portal vein is patent on color Doppler imaging with normal direction of blood flow towards the liver. IMPRESSION: Similar appearance of common duct dilatation, now measuring 10 mm. Heterogeneous liver parenchyma with nodular hepatic contour, features suggesting cirrhosis. Electronically Signed   By: Misty Stanley M.D.   On: 09/26/2018 15:59     Subjective: Patient seen and examined at bedside.  She feels much better and wants to go home.  She complains of some diarrhea.  She normally has diarrhea at home.  She is tolerating diet.  Discharge Exam: Vitals:   10/15/18 2038 10/16/18 0650  BP: (!) 124/53 127/64  Pulse: 81 81  Resp: 19 20  Temp: 98.6 F (37 C) 98.1 F (36.7 C)  SpO2: 98% 97%   Vitals:   10/15/18 0900 10/15/18 1319 10/15/18 2038 10/16/18 0650  BP:  129/61 (!) 124/53 127/64  Pulse:  92 81 81  Resp:  15 19 20   Temp:  98.2 F (36.8 C) 98.6 F (37 C) 98.1 F (36.7 C)  TempSrc:  Oral Oral Oral  SpO2:  100% 98% 97%  Weight: 47.2 kg   49.6 kg  Height: 5' 2"  (1.575 m)       General: Pt is alert, awake, not in acute distress.  Very thinly built Cardiovascular: rate controlled, S1/S2 + Respiratory: bilateral decreased breath sounds at bases Abdominal: Soft, NT, ND, bowel sounds + Extremities: Trace edema, no cyanosis    The results of significant diagnostics from this hospitalization (including imaging, microbiology, ancillary and laboratory) are listed below for reference.     Microbiology: No results found for this or any previous visit (from the past 240 hour(s)).   Labs: BNP  (last 3 results) No results for input(s): BNP in the last 8760 hours. Basic Metabolic Panel: Recent Labs  Lab 10/14/18 2059 10/14/18 2304 10/15/18 0546 10/16/18 0635  NA 134*  --  137 133*  K 2.8*  --  4.8 4.4  CL 96*  --  105 102  CO2 22  --  23 20*  GLUCOSE 92  --  93 103*  BUN 13  --  10 8  CREATININE 1.14*  --  0.84 0.73  CALCIUM 10.1  --  8.9 9.5  MG  --  1.9  --  1.8   Liver Function Tests: Recent Labs  Lab 10/14/18 2059 10/15/18 0546 10/16/18 0635  AST 89* 83* 84*  ALT 39 36 35  ALKPHOS 135* 115 115  BILITOT 4.6* 3.8* 5.5*  PROT 8.8* 7.7 7.6  ALBUMIN 4.2 3.6 3.6   Recent Labs  Lab 10/14/18 2059  LIPASE 40   Recent Labs  Lab 10/14/18 2257  AMMONIA 32   CBC: Recent Labs  Lab 10/14/18 2059 10/15/18 0546 10/16/18 0635  WBC 7.5 5.0  3.3*  NEUTROABS  --   --  1.8  HGB 9.2* 8.3* 8.1*  HCT 26.9* 25.1* 24.5*  MCV 108.9* 110.6* 111.4*  PLT 101* 91* 83*   Cardiac Enzymes: No results for input(s): CKTOTAL, CKMB, CKMBINDEX, TROPONINI in the last 168 hours. BNP: Invalid input(s): POCBNP CBG: No results for input(s): GLUCAP in the last 168 hours. D-Dimer No results for input(s): DDIMER in the last 72 hours. Hgb A1c No results for input(s): HGBA1C in the last 72 hours. Lipid Profile No results for input(s): CHOL, HDL, LDLCALC, TRIG, CHOLHDL, LDLDIRECT in the last 72 hours. Thyroid function studies No results for input(s): TSH, T4TOTAL, T3FREE, THYROIDAB in the last 72 hours.  Invalid input(s): FREET3 Anemia work up No results for input(s): VITAMINB12, FOLATE, FERRITIN, TIBC, IRON, RETICCTPCT in the last 72 hours. Urinalysis    Component Value Date/Time   COLORURINE YELLOW 10/14/2018 2257   APPEARANCEUR CLEAR 10/14/2018 2257   APPEARANCEUR Clear 02/04/2018 1118   LABSPEC 1.010 10/14/2018 2257   PHURINE 6.0 10/14/2018 2257   GLUCOSEU NEGATIVE 10/14/2018 2257   HGBUR NEGATIVE 10/14/2018 2257   BILIRUBINUR NEGATIVE 10/14/2018 2257   BILIRUBINUR  Negative 02/04/2018 [place] 10/14/2018 2257   PROTEINUR NEGATIVE 10/14/2018 2257   UROBILINOGEN 0.2 05/19/2016 1638   UROBILINOGEN 0.2 08/17/2015 1853   NITRITE NEGATIVE 10/14/2018 2257   LEUKOCYTESUR NEGATIVE 10/14/2018 2257   LEUKOCYTESUR Negative 02/04/2018 1118   Sepsis Labs Invalid input(s): PROCALCITONIN,  WBC,  LACTICIDVEN Microbiology No results found for this or any previous visit (from the past 240 hour(s)).   Time coordinating discharge: 35 minutes  SIGNED:   Aline August, MD  [facility] 10/16/2018, 10:33 AM Pager: [phone]  If 7PM-7AM, please contact night-coverage [email or web address] Password [email or web address]

## 2018-10-24 ENCOUNTER — Encounter (HOSPITAL_COMMUNITY): Payer: Self-pay

## 2018-10-24 ENCOUNTER — Emergency Department (HOSPITAL_COMMUNITY)
Admission: EM | Admit: 2018-10-24 | Discharge: 2018-10-24 | Disposition: A | Discharge status: Home / Self Care | Payer: Medicaid Other | Attending: Emergency Medicine | Admitting: Emergency Medicine

## 2018-10-24 ENCOUNTER — Emergency Department (HOSPITAL_COMMUNITY): Payer: Medicaid Other

## 2018-10-24 DIAGNOSIS — R1084 Generalized abdominal pain: Secondary | ICD-10-CM

## 2018-10-24 DIAGNOSIS — D696 Thrombocytopenia, unspecified: Secondary | ICD-10-CM | POA: Insufficient documentation

## 2018-10-24 DIAGNOSIS — F17210 Nicotine dependence, cigarettes, uncomplicated: Secondary | ICD-10-CM | POA: Diagnosis not present

## 2018-10-24 DIAGNOSIS — K5903 Drug induced constipation: Secondary | ICD-10-CM | POA: Insufficient documentation

## 2018-10-24 DIAGNOSIS — Z79899 Other long term (current) drug therapy: Secondary | ICD-10-CM | POA: Diagnosis not present

## 2018-10-24 DIAGNOSIS — I10 Essential (primary) hypertension: Secondary | ICD-10-CM | POA: Insufficient documentation

## 2018-10-24 DIAGNOSIS — D649 Anemia, unspecified: Secondary | ICD-10-CM

## 2018-10-24 DIAGNOSIS — E039 Hypothyroidism, unspecified: Secondary | ICD-10-CM | POA: Diagnosis not present

## 2018-10-24 DIAGNOSIS — R112 Nausea with vomiting, unspecified: Secondary | ICD-10-CM

## 2018-10-24 LAB — COMPREHENSIVE METABOLIC PANEL
ALT: 31 U/L (ref 0–44)
AST: 64 U/L — ABNORMAL HIGH (ref 15–41)
Albumin: 3.8 g/dL (ref 3.5–5.0)
Alkaline Phosphatase: 125 U/L (ref 38–126)
Anion gap: 12 (ref 5–15)
BUN: 16 mg/dL (ref 6–20)
CO2: 25 mmol/L (ref 22–32)
Calcium: 9.6 mg/dL (ref 8.9–10.3)
Chloride: 96 mmol/L — ABNORMAL LOW (ref 98–111)
Creatinine, Ser: 0.92 mg/dL (ref 0.44–1.00)
GFR calc Af Amer: >60 mL/min (ref >60)
Glucose, Bld: 115 mg/dL — ABNORMAL HIGH (ref 70–99)
Potassium: 3.9 mmol/L (ref 3.5–5.1)
Sodium: 133 mmol/L — ABNORMAL LOW (ref 135–145)
TOTAL PROTEIN: 8.1 g/dL (ref 6.5–8.1)
Total Bilirubin: 5.5 mg/dL — ABNORMAL HIGH (ref 0.3–1.2)

## 2018-10-24 LAB — CBC WITH DIFFERENTIAL/PLATELET
ABS IMMATURE GRANULOCYTES: 0.02 10*3/uL (ref 0.00–0.07)
Basophils Absolute: 0.1 10*3/uL (ref 0.0–0.1)
Basophils Relative: 1 %
Eosinophils Absolute: 0.3 10*3/uL (ref 0.0–0.5)
Eosinophils Relative: 5 %
HCT: 25.7 % — ABNORMAL LOW (ref 36.0–46.0)
Hemoglobin: 8.8 g/dL — ABNORMAL LOW (ref 12.0–15.0)
Immature Granulocytes: 0 %
Lymphocytes Relative: 13 %
Lymphs Abs: 0.6 10*3/uL — ABNORMAL LOW (ref 0.7–4.0)
MCH: 38.1 pg — ABNORMAL HIGH (ref 26.0–34.0)
MCHC: 34.2 g/dL (ref 30.0–36.0)
MCV: 111.3 fL — ABNORMAL HIGH (ref 80.0–100.0)
MONOS PCT: 7 %
Monocytes Absolute: 0.3 10*3/uL (ref 0.1–1.0)
NEUTROS ABS: 3.5 10*3/uL (ref 1.7–7.7)
Neutrophils Relative %: 74 %
Platelets: 78 10*3/uL — ABNORMAL LOW (ref 150–400)
RBC: 2.31 MIL/uL — ABNORMAL LOW (ref 3.87–5.11)
RDW: 15.2 % (ref 11.5–15.5)
WBC: 4.8 10*3/uL (ref 4.0–10.5)
nRBC: 0 % (ref 0.0–0.2)

## 2018-10-24 LAB — LIPASE, BLOOD: Lipase: 46 U/L (ref 11–51)

## 2018-10-24 LAB — URINALYSIS, ROUTINE W REFLEX MICROSCOPIC
BILIRUBIN URINE: NEGATIVE
GLUCOSE, UA: NEGATIVE mg/dL
Hgb urine dipstick: NEGATIVE
Ketones, ur: NEGATIVE mg/dL
Leukocytes, UA: NEGATIVE
Nitrite: NEGATIVE
Protein, ur: NEGATIVE mg/dL
Specific Gravity, Urine: 1.008 (ref 1.005–1.030)
pH: 7 (ref 5.0–8.0)

## 2018-10-24 LAB — I-STAT BETA HCG BLOOD, ED (MC, WL, AP ONLY): I-stat hCG, quantitative: <5 m[IU]/mL (ref <5)

## 2018-10-24 MED ORDER — PROMETHAZINE HCL 25 MG PO TABS: 25.0000 mg | ORAL_TABLET | Freq: Four times a day (QID) | ORAL | 0 refills | Status: DC | PRN

## 2018-10-24 MED ORDER — MORPHINE SULFATE (PF) 4 MG/ML IV SOLN: 4.0000 mg | Freq: Once | INTRAVENOUS | Status: DC

## 2018-10-24 MED ORDER — DIPHENHYDRAMINE HCL 50 MG/ML IJ SOLN: 12.5000 mg | Freq: Once | INTRAMUSCULAR | Status: DC

## 2018-10-24 MED ORDER — PROMETHAZINE HCL 25 MG/ML IJ SOLN
25.0000 mg | Freq: Once | INTRAMUSCULAR | Status: AC
  Administered 2018-10-24: 25 mg via INTRAVENOUS
  Filled 2018-10-24: qty 1

## 2018-10-24 MED ORDER — HYDROMORPHONE HCL 1 MG/ML IJ SOLN
1.0000 mg | Freq: Once | INTRAMUSCULAR | Status: AC
  Administered 2018-10-24: 1 mg via INTRAVENOUS
  Filled 2018-10-24: qty 1
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
## 2018-10-24 NOTE — Discharge Instructions (Addendum)
Your work up today has been reassuring. Continue using your home medications to help with pain. Use over the counter miralax to help with constipation. Use home zofran and/or phenergan as directed as needed for nausea. Stay well hydrated with plenty of water and gatorade. Increase water and fiber intake in your diet. Eat a low fat diet. Follow up with your gastroenterologist in 5-7 days for recheck of symptoms. Return to the ER for emergent changes or worsening symptoms.   Abdominal (belly) pain can be caused by many things. Your caregiver performed an examination and possibly ordered blood/urine tests and imaging (CT scan, x-rays, ultrasound). Many cases can be observed and treated at home after initial evaluation in the emergency department. Even though you are being discharged home, abdominal pain can be unpredictable. Therefore, you need a repeated exam if your pain does not resolve, returns, or worsens. Most patients with abdominal pain don't have to be admitted to the hospital or have surgery, but serious problems like appendicitis and gallbladder attacks can start out as nonspecific pain. Many abdominal conditions cannot be diagnosed in one visit, so follow-up evaluations are very important. SEEK IMMEDIATE MEDICAL ATTENTION IF YOU DEVELOP ANY OF THE FOLLOWING SYMPTOMS: The pain does not go away or becomes severe.  A temperature above 101 develops.  Repeated vomiting occurs (multiple episodes).  The pain becomes localized to portions of the abdomen. The right side could possibly be appendicitis. In an adult, the left lower portion of the abdomen could be colitis or diverticulitis.  Blood is being passed in stools or vomit (bright red or black tarry stools).  Return also if you develop chest pain, difficulty breathing, dizziness or fainting, or become confused, poorly responsive, or inconsolable (young children). The constipation stays for more than 4 days.  There is belly (abdominal) or rectal pain.   You do not seem to be getting better.

## 2018-10-24 NOTE — ED Notes (Signed)
Patient transported to CT 

## 2018-10-24 NOTE — ED Triage Notes (Signed)
Pt presents with c/o vomiting and abdominal pain. Pt also reports she feels like her heart is racing. Pt's family at beside reports the heart racing does happen when she begins vomiting. Pt has a diagnosis of pancreatitis.

## 2018-10-28 IMAGING — CT CT ABD-PELV W/O CM
2 of 4 series · 15 of 46 positions shown, 17 images · non-contrast
Comparison: 12/24/2017

CLINICAL DATA: Left lower quadrant abdominal pain with fever and
vomiting. History of cirrhosis

EXAM:
CT ABDOMEN AND PELVIS WITHOUT CONTRAST
TECHNIQUE: Multidetector CT imaging of the abdomen and pelvis was performed
following the standard protocol without IV contrast.

[Series 3: abd/ pelvis 5.0 i30f 2 · axial · 0.89mm/px · z∈[+706,+1142]mm · 12 of 99 slices shown, 14 images]
[im 8/99  soft-tissue]
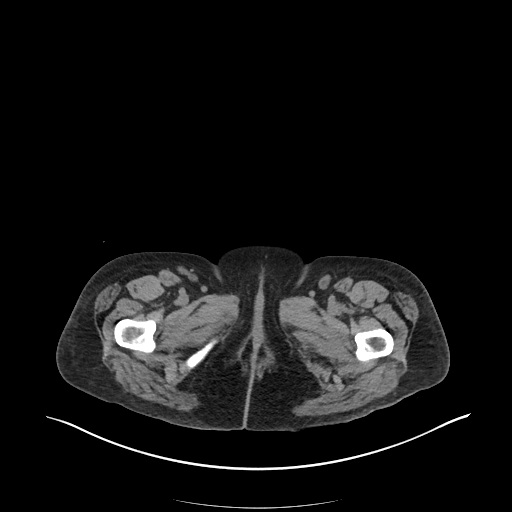
[im 8/99  bone]
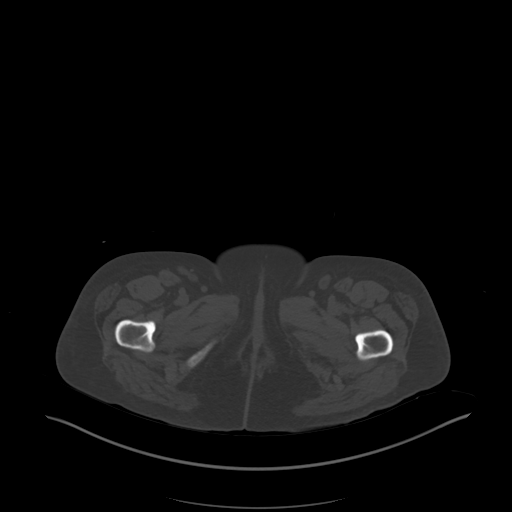
[im 16/99  soft-tissue]
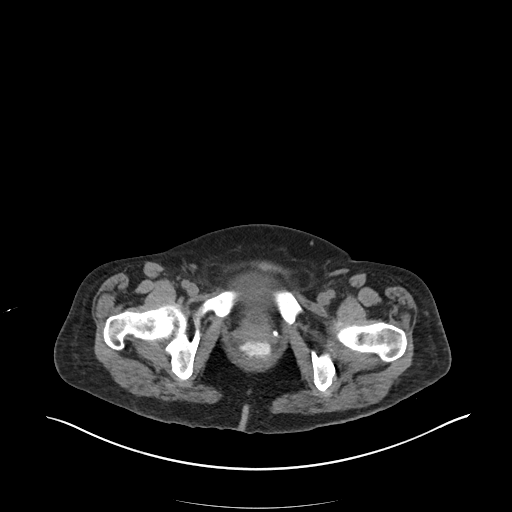
[im 24/99  soft-tissue]
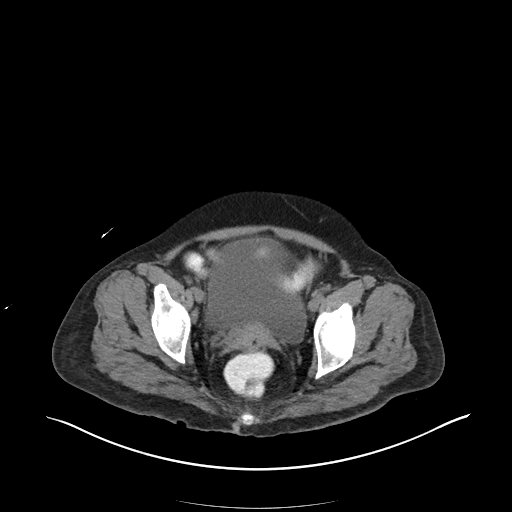
[im 32/99  soft-tissue]
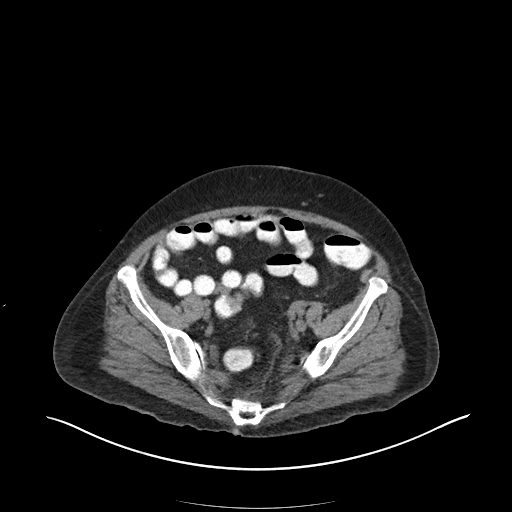
[im 40/99  soft-tissue]
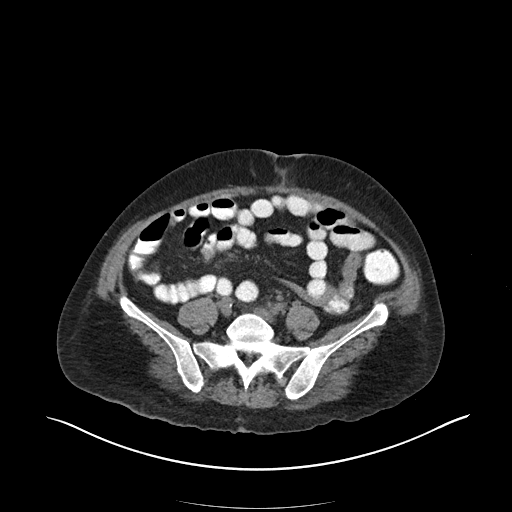
[im 48/99  soft-tissue]
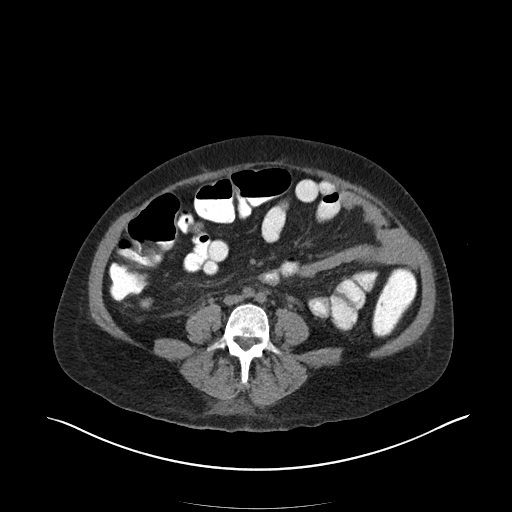
[im 55/99  soft-tissue]
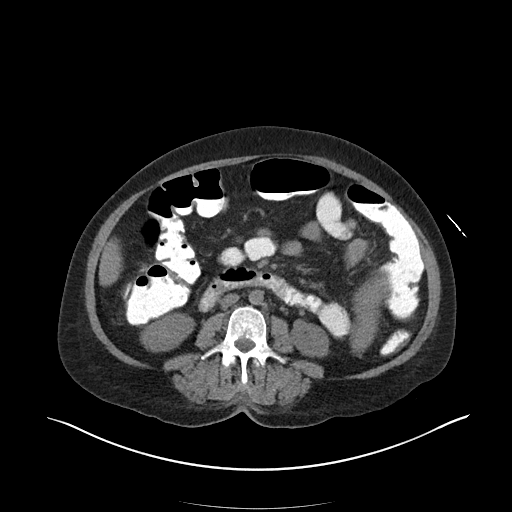
[im 63/99  soft-tissue]
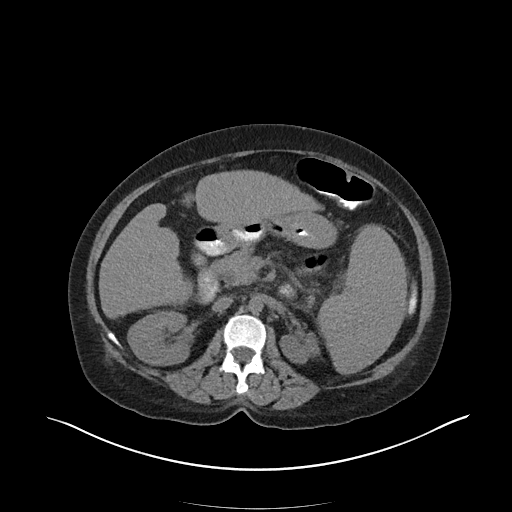
[im 71/99  soft-tissue]
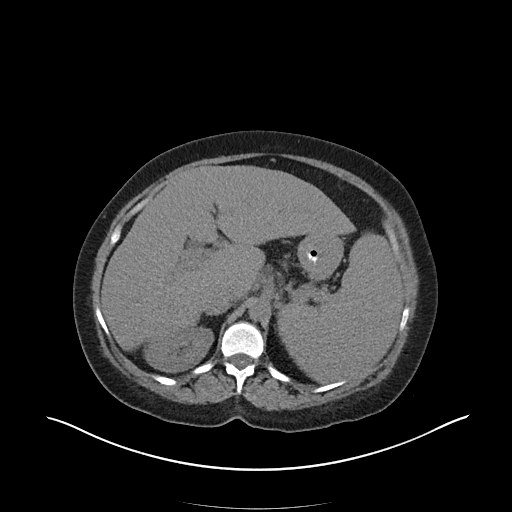
[im 71/99  bone]
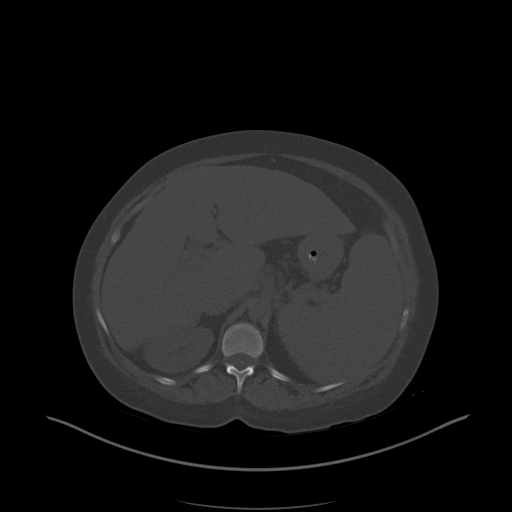
[im 79/99  soft-tissue]
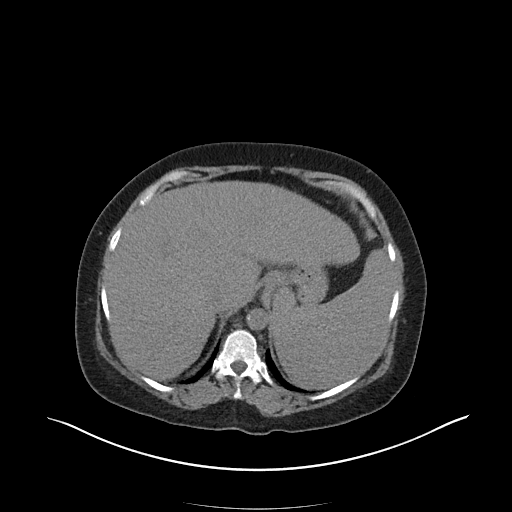
[im 87/99  soft-tissue]
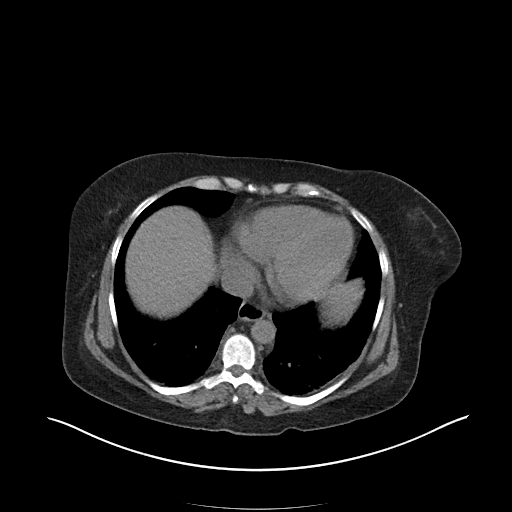
[im 95/99  soft-tissue]
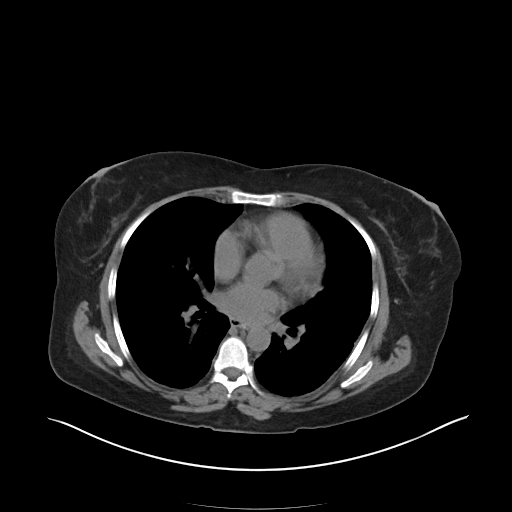

[Series 6: cor st · coronal · 0.68mm/px · 3 of 101 slices shown]
[im 34/101  soft-tissue]
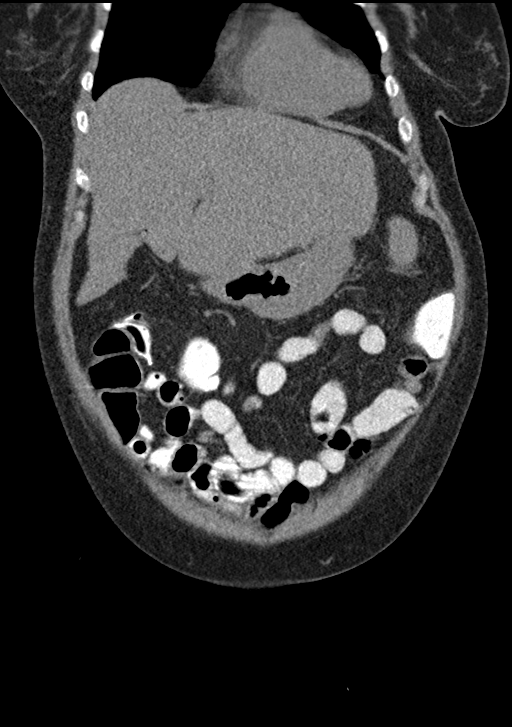
[im 45/101  soft-tissue]
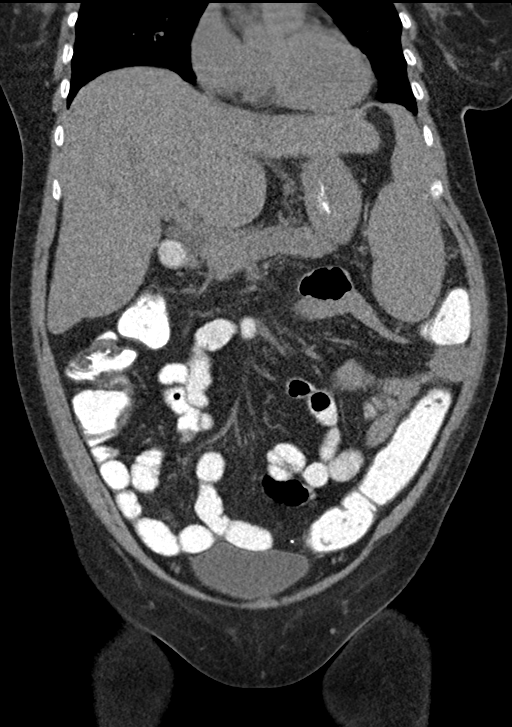
[im 56/101  soft-tissue]
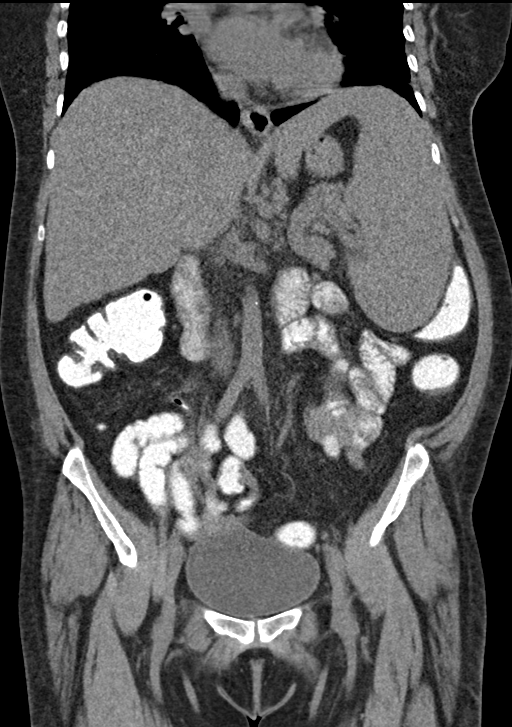

[15 of 46 positions shown; findings below may reference images not displayed]

FINDINGS: Lower chest: Bibasilar atelectasis noted. Normal heart size. No
pericardial or pleural effusion. No acute lower chest finding.

Hepatobiliary: Liver is enlarged measuring 19 cm in length.
Nodularity to the liver surface compatible with underlying
cirrhosis. Remote cholecystectomy. No biliary dilatation. No large
focal abnormality of the liver by noncontrast imaging.

Pancreas: No focal abnormality or ductal dilatation by noncontrast
imaging.

Spleen: Spleen is also enlarged as before measuring 16.7 cm in
length.

Adrenals/Urinary Tract: Normal adrenal glands.

Atrophy of the left kidney appearing chronic. No renal obstruction
or hydronephrosis. No hydroureter or obstructing ureteral calculus.
Bladder unremarkable.

Stomach/Bowel: Negative for bowel obstruction, significant
dilatation, ileus, or free air. Normal appendix containing contrast.
No significant bowel wall thickening. No fluid collection or
abscess.

Vascular/Lymphatic: Aortic atherosclerosis noted. Negative for
aneurysm. No adenopathy. No retroperitoneal abnormality hemorrhage.

Reproductive: Uterus and adnexa normal in size. No pelvic free fluid
or fluid collection.

Other: No abdominal wall hernia or abnormality. No abdominopelvic
ascites.

Musculoskeletal: No acute or significant osseous findings.
IMPRESSION: No acute abdominal or pelvic finding by noncontrast CT.

Hepatomegaly and evidence of hepatic cirrhosis

Associated splenomegaly

Chronic left renal atrophy

Remote cholecystectomy

Atherosclerosis without aneurysm

## 2018-10-28 IMAGING — DX DG CHEST 2V
2 series · 2 of 2 positions shown · non-contrast
Comparison: 01/01/2018

CLINICAL DATA: Fever and cough.

EXAM:
CHEST - 2 VIEW

[chest lat]
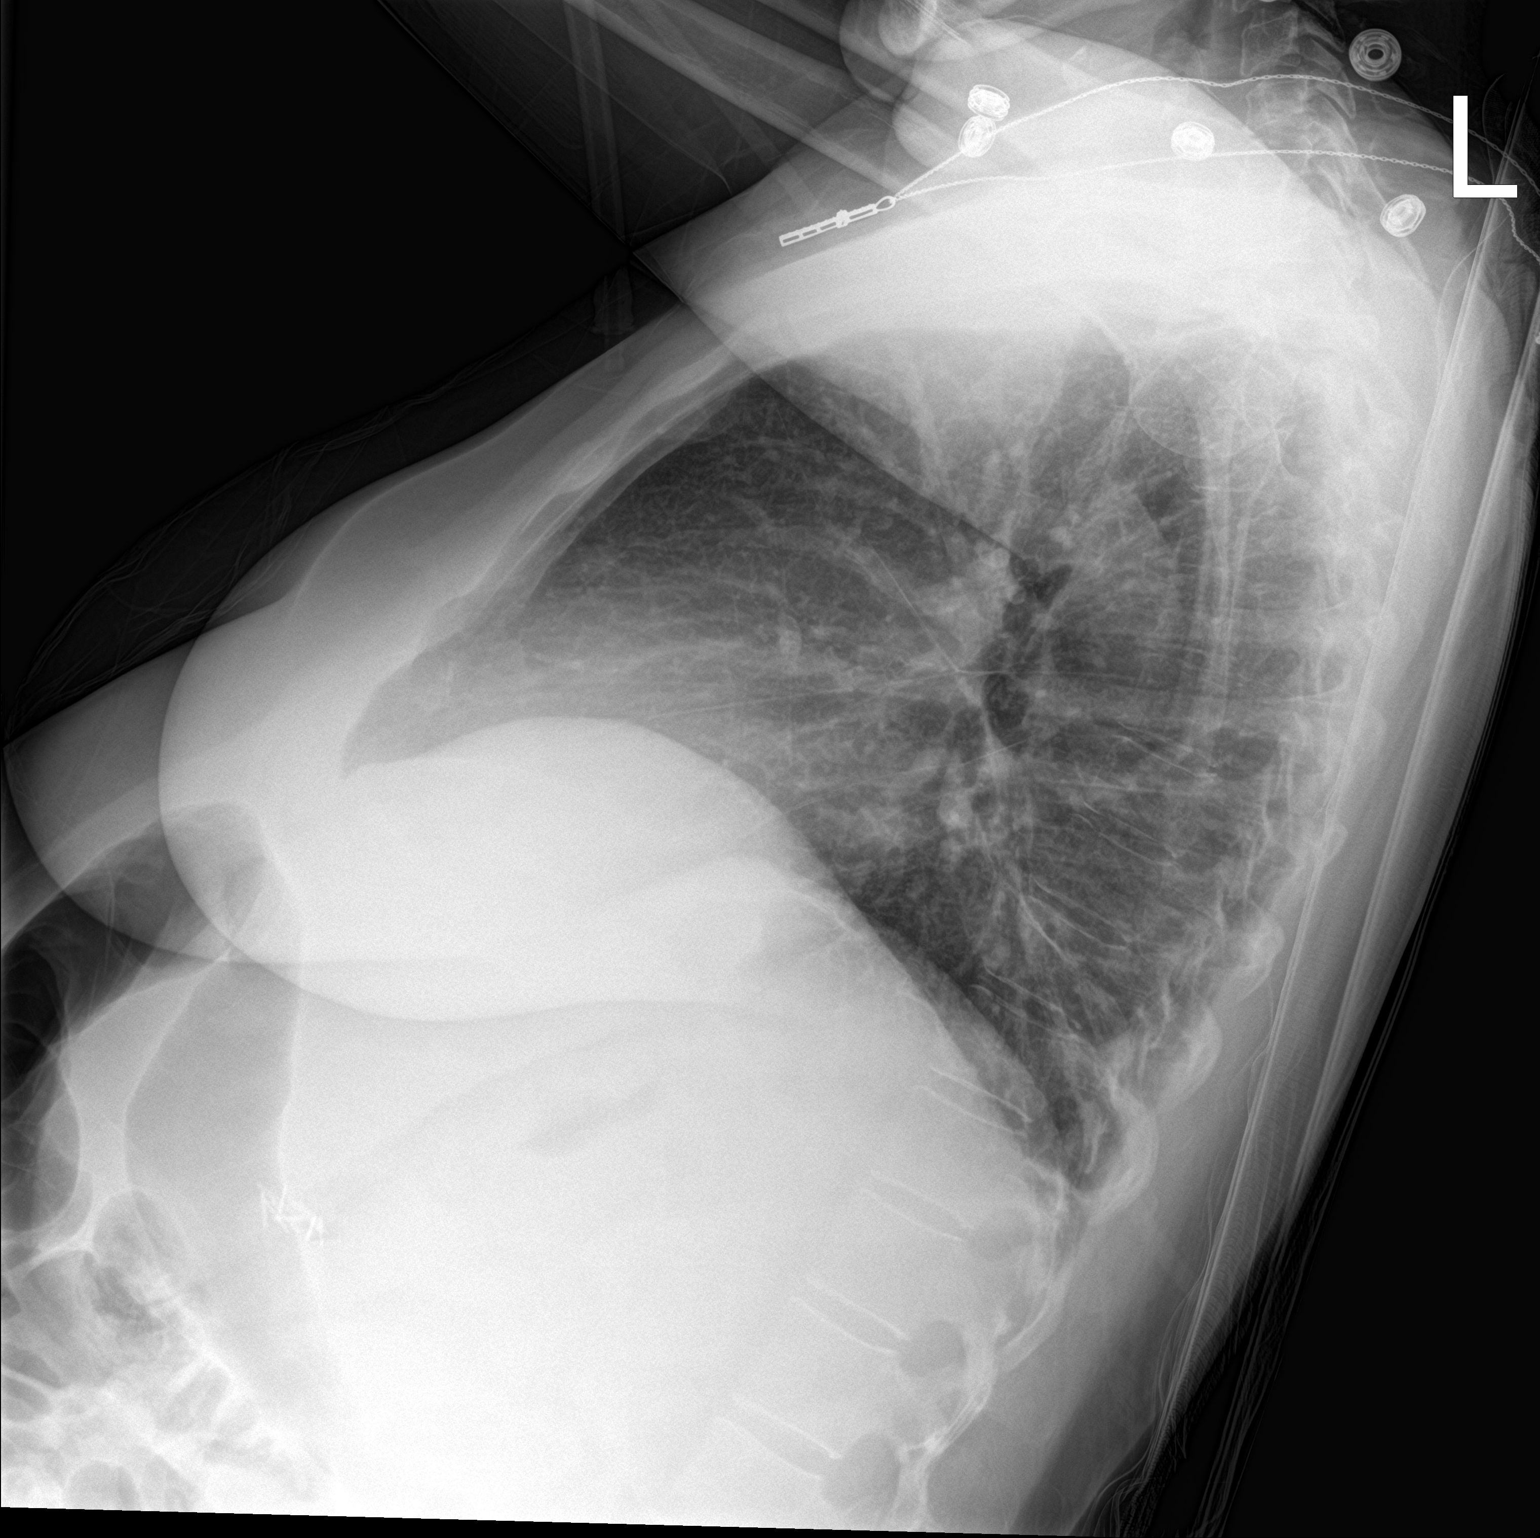

[chest ap]
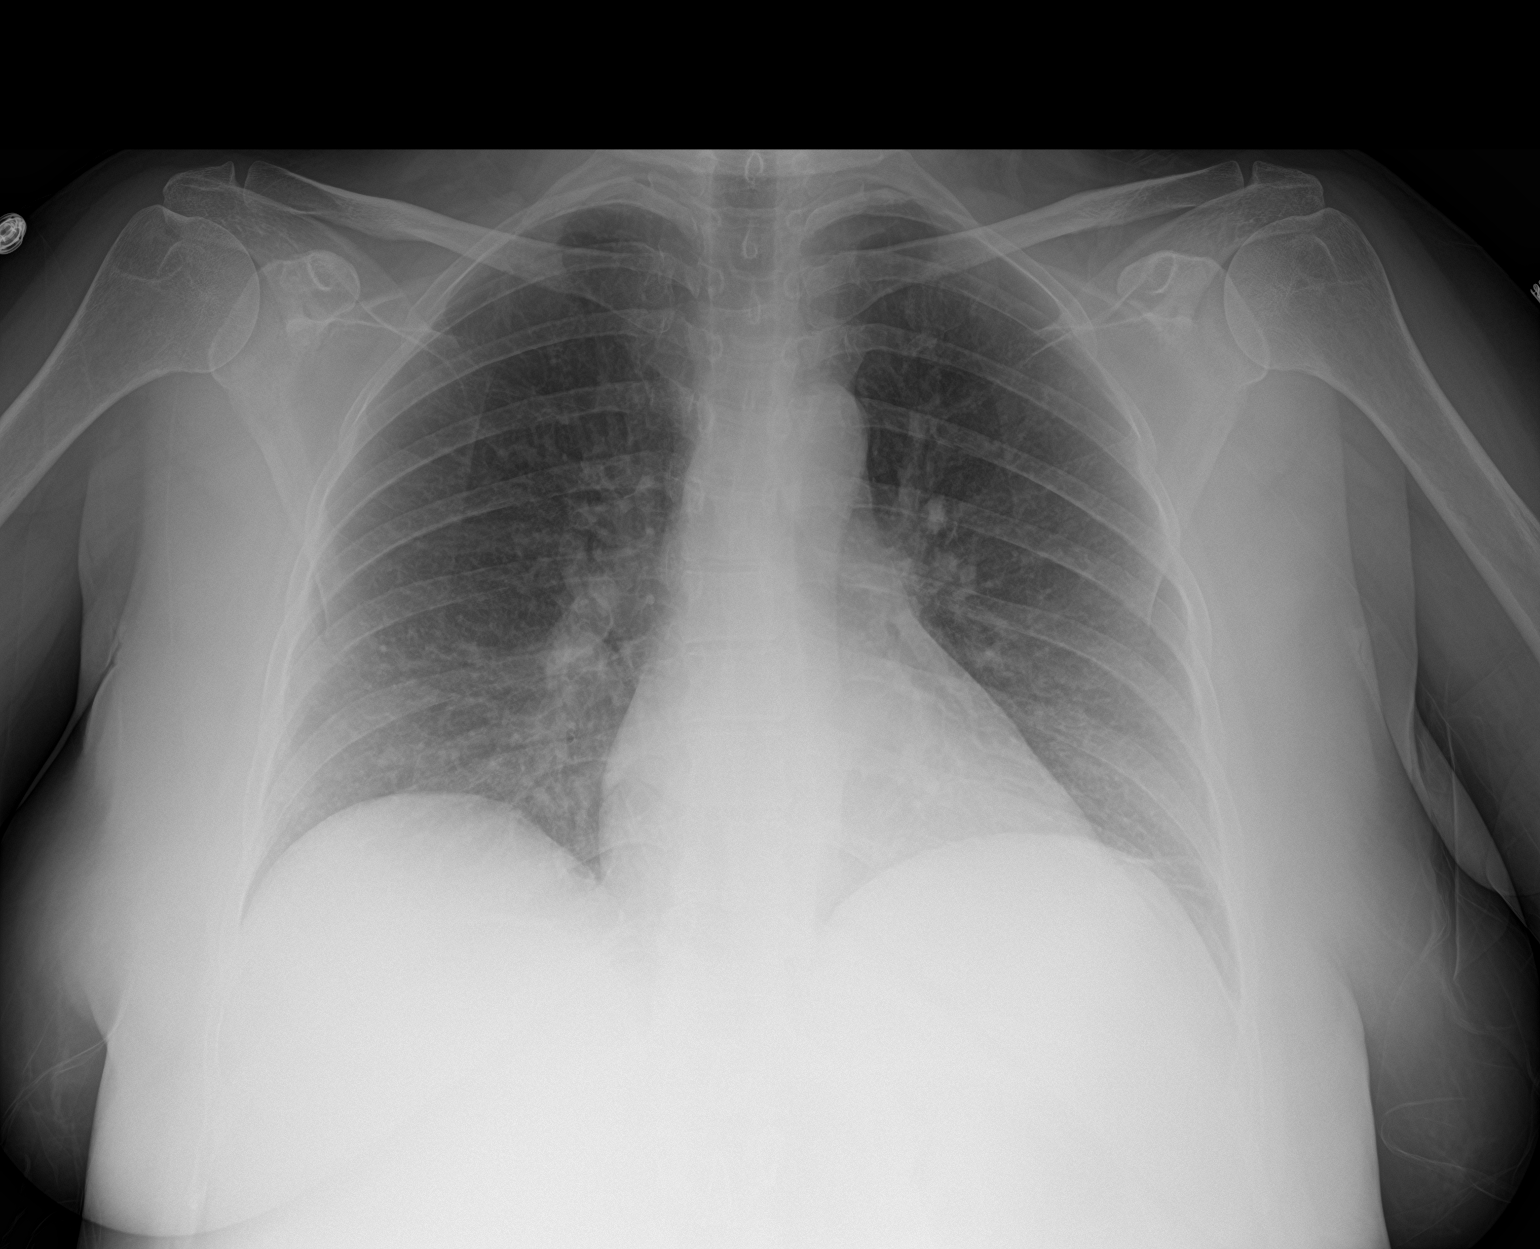

[2 of 2 positions shown; findings below may reference images not displayed]

FINDINGS: Lateral view degraded by patient arm position.

Midline trachea.  Normal heart size and mediastinal contours.

Sharp costophrenic angles. No pneumothorax. mild subsegmental
atelectasis or scar at the left lung base.
IMPRESSION: No active cardiopulmonary disease.

## 2018-11-01 ENCOUNTER — Other Ambulatory Visit: Payer: Self-pay

## 2018-11-01 ENCOUNTER — Inpatient Hospital Stay (HOSPITAL_COMMUNITY)
Admission: EM | Admit: 2018-11-01 | Discharge: 2018-11-02 | DRG: 871 | Disposition: A | Discharge status: Home / Self Care | Payer: Medicaid Other | Attending: Internal Medicine | Admitting: Internal Medicine

## 2018-11-01 ENCOUNTER — Encounter (HOSPITAL_COMMUNITY): Payer: Self-pay | Admitting: *Deleted

## 2018-11-01 ENCOUNTER — Emergency Department (HOSPITAL_COMMUNITY): Payer: Medicaid Other

## 2018-11-01 ENCOUNTER — Ambulatory Visit: Payer: Self-pay | Admitting: Gastroenterology

## 2018-11-01 DIAGNOSIS — D61818 Other pancytopenia: Secondary | ICD-10-CM | POA: Diagnosis present

## 2018-11-01 DIAGNOSIS — F419 Anxiety disorder, unspecified: Secondary | ICD-10-CM | POA: Diagnosis present

## 2018-11-01 DIAGNOSIS — D696 Thrombocytopenia, unspecified: Secondary | ICD-10-CM | POA: Diagnosis present

## 2018-11-01 DIAGNOSIS — D638 Anemia in other chronic diseases classified elsewhere: Secondary | ICD-10-CM | POA: Diagnosis present

## 2018-11-01 DIAGNOSIS — R9431 Abnormal electrocardiogram [ECG] [EKG]: Secondary | ICD-10-CM | POA: Diagnosis present

## 2018-11-01 DIAGNOSIS — Z79891 Long term (current) use of opiate analgesic: Secondary | ICD-10-CM

## 2018-11-01 DIAGNOSIS — Z86010 Personal history of colonic polyps: Secondary | ICD-10-CM

## 2018-11-01 DIAGNOSIS — E039 Hypothyroidism, unspecified: Secondary | ICD-10-CM | POA: Diagnosis present

## 2018-11-01 DIAGNOSIS — Z79899 Other long term (current) drug therapy: Secondary | ICD-10-CM

## 2018-11-01 DIAGNOSIS — K7290 Hepatic failure, unspecified without coma: Secondary | ICD-10-CM | POA: Diagnosis present

## 2018-11-01 DIAGNOSIS — R233 Spontaneous ecchymoses: Secondary | ICD-10-CM | POA: Diagnosis present

## 2018-11-01 DIAGNOSIS — G8929 Other chronic pain: Secondary | ICD-10-CM | POA: Diagnosis present

## 2018-11-01 DIAGNOSIS — Z885 Allergy status to narcotic agent status: Secondary | ICD-10-CM

## 2018-11-01 DIAGNOSIS — I10 Essential (primary) hypertension: Secondary | ICD-10-CM | POA: Diagnosis present

## 2018-11-01 DIAGNOSIS — G629 Polyneuropathy, unspecified: Secondary | ICD-10-CM | POA: Diagnosis present

## 2018-11-01 DIAGNOSIS — K8689 Other specified diseases of pancreas: Secondary | ICD-10-CM | POA: Diagnosis present

## 2018-11-01 DIAGNOSIS — K652 Spontaneous bacterial peritonitis: Secondary | ICD-10-CM

## 2018-11-01 DIAGNOSIS — K219 Gastro-esophageal reflux disease without esophagitis: Secondary | ICD-10-CM | POA: Diagnosis present

## 2018-11-01 DIAGNOSIS — K8681 Exocrine pancreatic insufficiency: Secondary | ICD-10-CM | POA: Diagnosis present

## 2018-11-01 DIAGNOSIS — K861 Other chronic pancreatitis: Secondary | ICD-10-CM | POA: Diagnosis present

## 2018-11-01 DIAGNOSIS — E739 Lactose intolerance, unspecified: Secondary | ICD-10-CM | POA: Diagnosis present

## 2018-11-01 DIAGNOSIS — Z8249 Family history of ischemic heart disease and other diseases of the circulatory system: Secondary | ICD-10-CM

## 2018-11-01 DIAGNOSIS — Z888 Allergy status to other drugs, medicaments and biological substances status: Secondary | ICD-10-CM

## 2018-11-01 DIAGNOSIS — R109 Unspecified abdominal pain: Secondary | ICD-10-CM | POA: Diagnosis present

## 2018-11-01 DIAGNOSIS — I959 Hypotension, unspecified: Secondary | ICD-10-CM | POA: Diagnosis present

## 2018-11-01 DIAGNOSIS — Z720 Tobacco use: Secondary | ICD-10-CM | POA: Diagnosis present

## 2018-11-01 DIAGNOSIS — K838 Other specified diseases of biliary tract: Secondary | ICD-10-CM | POA: Diagnosis present

## 2018-11-01 DIAGNOSIS — F17210 Nicotine dependence, cigarettes, uncomplicated: Secondary | ICD-10-CM | POA: Diagnosis present

## 2018-11-01 DIAGNOSIS — K7031 Alcoholic cirrhosis of liver with ascites: Secondary | ICD-10-CM | POA: Diagnosis present

## 2018-11-01 DIAGNOSIS — A419 Sepsis, unspecified organism: Principal | ICD-10-CM | POA: Diagnosis present

## 2018-11-01 DIAGNOSIS — K7460 Unspecified cirrhosis of liver: Secondary | ICD-10-CM | POA: Diagnosis present

## 2018-11-01 DIAGNOSIS — K7030 Alcoholic cirrhosis of liver without ascites: Secondary | ICD-10-CM | POA: Diagnosis present

## 2018-11-01 DIAGNOSIS — R161 Splenomegaly, not elsewhere classified: Secondary | ICD-10-CM | POA: Diagnosis present

## 2018-11-01 LAB — COMPREHENSIVE METABOLIC PANEL
ALT: 27 U/L (ref 0–44)
AST: 54 U/L — ABNORMAL HIGH (ref 15–41)
Albumin: 3.7 g/dL (ref 3.5–5.0)
Alkaline Phosphatase: 133 U/L — ABNORMAL HIGH (ref 38–126)
Anion gap: 10 (ref 5–15)
BUN: 15 mg/dL (ref 6–20)
CO2: 20 mmol/L — ABNORMAL LOW (ref 22–32)
Calcium: 9.4 mg/dL (ref 8.9–10.3)
Chloride: 104 mmol/L (ref 98–111)
Creatinine, Ser: 1.01 mg/dL — ABNORMAL HIGH (ref 0.44–1.00)
GFR calc Af Amer: >60 mL/min (ref >60)
GFR calc non Af Amer: >60 mL/min (ref >60)
Glucose, Bld: 168 mg/dL — ABNORMAL HIGH (ref 70–99)
POTASSIUM: 4.1 mmol/L (ref 3.5–5.1)
SODIUM: 134 mmol/L — AB (ref 135–145)
Total Bilirubin: 4.5 mg/dL — ABNORMAL HIGH (ref 0.3–1.2)
Total Protein: 7.7 g/dL (ref 6.5–8.1)

## 2018-11-01 LAB — CBC
HCT: 22.7 % — ABNORMAL LOW (ref 36.0–46.0)
Hemoglobin: 7.6 g/dL — ABNORMAL LOW (ref 12.0–15.0)
MCH: 38.4 pg — ABNORMAL HIGH (ref 26.0–34.0)
MCHC: 33.5 g/dL (ref 30.0–36.0)
MCV: 114.6 fL — AB (ref 80.0–100.0)
NRBC: 0 % (ref 0.0–0.2)
Platelets: 64 10*3/uL — ABNORMAL LOW (ref 150–400)
RBC: 1.98 MIL/uL — ABNORMAL LOW (ref 3.87–5.11)
RDW: 14.2 % (ref 11.5–15.5)
WBC: 3.3 10*3/uL — ABNORMAL LOW (ref 4.0–10.5)

## 2018-11-01 LAB — POCT I-STAT TROPONIN I: Troponin i, poc: 0 ng/mL (ref 0.00–0.08)

## 2018-11-01 LAB — I-STAT BETA HCG BLOOD, ED (NOT ORDERABLE): I-stat hCG, quantitative: <5 m[IU]/mL (ref <5)

## 2018-11-01 LAB — CG4 I-STAT (LACTIC ACID): Lactic Acid, Venous: 1.36 mmol/L (ref 0.5–1.9)

## 2018-11-01 LAB — LIPASE, BLOOD: Lipase: 46 U/L (ref 11–51)

## 2018-11-01 MED ORDER — PIPERACILLIN-TAZOBACTAM 3.375 G IVPB 30 MIN: 3.3750 g | Freq: Once | INTRAVENOUS | Status: DC

## 2018-11-01 MED ORDER — SODIUM CHLORIDE 0.9 % IV SOLN
2.0000 g | Freq: Once | INTRAVENOUS | Status: DC
  Administered 2018-11-01: 2 g via INTRAVENOUS
  Filled 2018-11-01: qty 2

## 2018-11-01 MED ORDER — KETAMINE HCL 50 MG/5ML IJ SOSY
0.3000 mg/kg | PREFILLED_SYRINGE | Freq: Once | INTRAMUSCULAR | Status: AC
  Administered 2018-11-01: 15 mg via INTRAVENOUS
  Filled 2018-11-01: qty 5

## 2018-11-01 NOTE — ED Triage Notes (Signed)
Pt arrives with multiple complaints, she is on list for Liver transplant, has abd pain with ascites, N/V today, stool formed today usually diarrhea. Pt c/o heart problems, pain, etc.
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
## 2018-11-01 NOTE — Progress Notes (Signed)
A consult was received from an ED physician for Cefepime per pharmacy dosing.  The patient's profile has been reviewed for ht/wt/allergies/indication/available labs.   A one time order has been placed for Cefepime 2gm iv x1.  Further antibiotics/pharmacy consults should be ordered by admitting physician if indicated.                       Thank you, Nani Skillern Crowford 11/01/2018  11:13 PM

## 2018-11-01 NOTE — Progress Notes (Signed)
A consult was received from an ED physician for zosyn per pharmacy dosing.  The patient's profile has been reviewed for ht/wt/allergies/indication/available labs.   A one time order has been placed for Zosyn 3.375g IV x1.  Further antibiotics/pharmacy consults should be ordered by admitting physician if indicated.                       Thank you, Nani Skillern Crowford 11/01/2018  11:46 PM

## 2018-11-02 DIAGNOSIS — D638 Anemia in other chronic diseases classified elsewhere: Secondary | ICD-10-CM

## 2018-11-02 DIAGNOSIS — D61818 Other pancytopenia: Secondary | ICD-10-CM | POA: Diagnosis present

## 2018-11-02 DIAGNOSIS — K7030 Alcoholic cirrhosis of liver without ascites: Secondary | ICD-10-CM

## 2018-11-02 DIAGNOSIS — A419 Sepsis, unspecified organism: Secondary | ICD-10-CM | POA: Diagnosis present

## 2018-11-02 DIAGNOSIS — R1013 Epigastric pain: Secondary | ICD-10-CM | POA: Diagnosis not present

## 2018-11-02 DIAGNOSIS — R9431 Abnormal electrocardiogram [ECG] [EKG]: Secondary | ICD-10-CM

## 2018-11-02 DIAGNOSIS — K861 Other chronic pancreatitis: Secondary | ICD-10-CM | POA: Diagnosis present

## 2018-11-02 DIAGNOSIS — R161 Splenomegaly, not elsewhere classified: Secondary | ICD-10-CM | POA: Diagnosis present

## 2018-11-02 DIAGNOSIS — R6520 Severe sepsis without septic shock: Secondary | ICD-10-CM

## 2018-11-02 DIAGNOSIS — Z79899 Other long term (current) drug therapy: Secondary | ICD-10-CM | POA: Diagnosis not present

## 2018-11-02 DIAGNOSIS — K8681 Exocrine pancreatic insufficiency: Secondary | ICD-10-CM | POA: Diagnosis present

## 2018-11-02 DIAGNOSIS — K219 Gastro-esophageal reflux disease without esophagitis: Secondary | ICD-10-CM | POA: Diagnosis present

## 2018-11-02 DIAGNOSIS — Z720 Tobacco use: Secondary | ICD-10-CM

## 2018-11-02 DIAGNOSIS — E739 Lactose intolerance, unspecified: Secondary | ICD-10-CM | POA: Diagnosis present

## 2018-11-02 DIAGNOSIS — K838 Other specified diseases of biliary tract: Secondary | ICD-10-CM | POA: Diagnosis present

## 2018-11-02 DIAGNOSIS — K7290 Hepatic failure, unspecified without coma: Secondary | ICD-10-CM | POA: Diagnosis present

## 2018-11-02 DIAGNOSIS — K7031 Alcoholic cirrhosis of liver with ascites: Secondary | ICD-10-CM | POA: Diagnosis present

## 2018-11-02 DIAGNOSIS — D696 Thrombocytopenia, unspecified: Secondary | ICD-10-CM

## 2018-11-02 DIAGNOSIS — I959 Hypotension, unspecified: Secondary | ICD-10-CM | POA: Diagnosis present

## 2018-11-02 DIAGNOSIS — Z79891 Long term (current) use of opiate analgesic: Secondary | ICD-10-CM | POA: Diagnosis not present

## 2018-11-02 DIAGNOSIS — K860 Alcohol-induced chronic pancreatitis: Secondary | ICD-10-CM

## 2018-11-02 DIAGNOSIS — G9340 Encephalopathy, unspecified: Secondary | ICD-10-CM

## 2018-11-02 DIAGNOSIS — Z86010 Personal history of colonic polyps: Secondary | ICD-10-CM | POA: Diagnosis not present

## 2018-11-02 DIAGNOSIS — R188 Other ascites: Secondary | ICD-10-CM | POA: Diagnosis not present

## 2018-11-02 DIAGNOSIS — G629 Polyneuropathy, unspecified: Secondary | ICD-10-CM | POA: Diagnosis present

## 2018-11-02 DIAGNOSIS — I10 Essential (primary) hypertension: Secondary | ICD-10-CM | POA: Diagnosis present

## 2018-11-02 DIAGNOSIS — G8929 Other chronic pain: Secondary | ICD-10-CM | POA: Diagnosis present

## 2018-11-02 DIAGNOSIS — K652 Spontaneous bacterial peritonitis: Secondary | ICD-10-CM

## 2018-11-02 DIAGNOSIS — E039 Hypothyroidism, unspecified: Secondary | ICD-10-CM | POA: Diagnosis present

## 2018-11-02 DIAGNOSIS — F419 Anxiety disorder, unspecified: Secondary | ICD-10-CM | POA: Diagnosis present

## 2018-11-02 DIAGNOSIS — R233 Spontaneous ecchymoses: Secondary | ICD-10-CM | POA: Diagnosis present

## 2018-11-02 DIAGNOSIS — F17210 Nicotine dependence, cigarettes, uncomplicated: Secondary | ICD-10-CM | POA: Diagnosis present

## 2018-11-02 LAB — COMPREHENSIVE METABOLIC PANEL
ALK PHOS: 103 U/L (ref 38–126)
ALT: 24 U/L (ref 0–44)
ALT: 29 U/L (ref 0–44)
AST: 42 U/L — ABNORMAL HIGH (ref 15–41)
AST: 87 U/L — ABNORMAL HIGH (ref 15–41)
Albumin: 3.2 g/dL — ABNORMAL LOW (ref 3.5–5.0)
Albumin: 3.2 g/dL — ABNORMAL LOW (ref 3.5–5.0)
Alkaline Phosphatase: 97 U/L (ref 38–126)
Anion gap: 10 (ref 5–15)
Anion gap: 9 (ref 5–15)
BILIRUBIN TOTAL: 5.9 mg/dL — AB (ref 0.3–1.2)
BUN: 18 mg/dL (ref 6–20)
BUN: 19 mg/dL (ref 6–20)
CALCIUM: 8.5 mg/dL — AB (ref 8.9–10.3)
CO2: 19 mmol/L — ABNORMAL LOW (ref 22–32)
CO2: 18 mmol/L — ABNORMAL LOW (ref 22–32)
Calcium: 7.9 mg/dL — ABNORMAL LOW (ref 8.9–10.3)
Chloride: 107 mmol/L (ref 98–111)
Chloride: 109 mmol/L (ref 98–111)
Creatinine, Ser: 1.26 mg/dL — ABNORMAL HIGH (ref 0.44–1.00)
Creatinine, Ser: 1 mg/dL (ref 0.44–1.00)
GFR calc Af Amer: 58 mL/min — ABNORMAL LOW (ref >60)
GFR calc Af Amer: >60 mL/min (ref >60)
GFR calc non Af Amer: 50 mL/min — ABNORMAL LOW (ref >60)
GFR calc non Af Amer: >60 mL/min (ref >60)
Glucose, Bld: 103 mg/dL — ABNORMAL HIGH (ref 70–99)
Glucose, Bld: 94 mg/dL (ref 70–99)
Potassium: 3.8 mmol/L (ref 3.5–5.1)
Potassium: 4 mmol/L (ref 3.5–5.1)
Sodium: 136 mmol/L (ref 135–145)
Sodium: 136 mmol/L (ref 135–145)
Total Bilirubin: 4.4 mg/dL — ABNORMAL HIGH (ref 0.3–1.2)
Total Protein: 6.5 g/dL (ref 6.5–8.1)
Total Protein: 6.7 g/dL (ref 6.5–8.1)

## 2018-11-02 LAB — CBC WITH DIFFERENTIAL/PLATELET
ABS IMMATURE GRANULOCYTES: 0.02 10*3/uL (ref 0.00–0.07)
Abs Immature Granulocytes: 0.01 10*3/uL (ref 0.00–0.07)
BASOS PCT: 1 %
Basophils Absolute: 0 10*3/uL (ref 0.0–0.1)
Basophils Absolute: 0 10*3/uL (ref 0.0–0.1)
Basophils Relative: 1 %
Eosinophils Absolute: 0.1 10*3/uL (ref 0.0–0.5)
Eosinophils Absolute: 0.3 10*3/uL (ref 0.0–0.5)
Eosinophils Relative: 3 %
Eosinophils Relative: 6 %
HCT: 21.2 % — ABNORMAL LOW (ref 36.0–46.0)
HCT: 25.1 % — ABNORMAL LOW (ref 36.0–46.0)
HEMOGLOBIN: 8.3 g/dL — AB (ref 12.0–15.0)
Hemoglobin: 7 g/dL — ABNORMAL LOW (ref 12.0–15.0)
IMMATURE GRANULOCYTES: 0 %
Immature Granulocytes: 0 %
Lymphocytes Relative: 8 %
Lymphocytes Relative: 7 %
Lymphs Abs: 0.4 10*3/uL — ABNORMAL LOW (ref 0.7–4.0)
Lymphs Abs: 0.3 10*3/uL — ABNORMAL LOW (ref 0.7–4.0)
MCH: 38.3 pg — ABNORMAL HIGH (ref 26.0–34.0)
MCH: 35.8 pg — ABNORMAL HIGH (ref 26.0–34.0)
MCHC: 33 g/dL (ref 30.0–36.0)
MCHC: 33.1 g/dL (ref 30.0–36.0)
MCV: 115.8 fL — ABNORMAL HIGH (ref 80.0–100.0)
MCV: 108.2 fL — ABNORMAL HIGH (ref 80.0–100.0)
MONOS PCT: 8 %
Monocytes Absolute: 0.3 10*3/uL (ref 0.1–1.0)
Monocytes Absolute: 0.3 10*3/uL (ref 0.1–1.0)
Monocytes Relative: 7 %
Neutro Abs: 3.4 10*3/uL (ref 1.7–7.7)
Neutro Abs: 3.5 10*3/uL (ref 1.7–7.7)
Neutrophils Relative %: 81 %
Neutrophils Relative %: 78 %
Platelets: 58 10*3/uL — ABNORMAL LOW (ref 150–400)
Platelets: 49 10*3/uL — ABNORMAL LOW (ref 150–400)
RBC: 1.83 MIL/uL — ABNORMAL LOW (ref 3.87–5.11)
RBC: 2.32 MIL/uL — ABNORMAL LOW (ref 3.87–5.11)
RDW: 14.2 % (ref 11.5–15.5)
RDW: 22.7 % — ABNORMAL HIGH (ref 11.5–15.5)
WBC: 4.2 10*3/uL (ref 4.0–10.5)
WBC: 4.5 10*3/uL (ref 4.0–10.5)
nRBC: 0 % (ref 0.0–0.2)
nRBC: 0 % (ref 0.0–0.2)

## 2018-11-02 LAB — AMMONIA
Ammonia: 95 umol/L — ABNORMAL HIGH (ref 9–35)
Ammonia: 108 umol/L — ABNORMAL HIGH (ref 9–35)
Ammonia: 81 umol/L — ABNORMAL HIGH (ref 9–35)

## 2018-11-02 LAB — PHOSPHORUS: Phosphorus: 4.1 mg/dL (ref 2.5–4.6)

## 2018-11-02 LAB — URINALYSIS, ROUTINE W REFLEX MICROSCOPIC
Glucose, UA: NEGATIVE mg/dL
Hgb urine dipstick: NEGATIVE
Ketones, ur: NEGATIVE mg/dL
Nitrite: NEGATIVE
Protein, ur: NEGATIVE mg/dL
Specific Gravity, Urine: 1.026 (ref 1.005–1.030)
pH: 5 (ref 5.0–8.0)

## 2018-11-02 LAB — PREPARE RBC (CROSSMATCH)

## 2018-11-02 LAB — CBC
HCT: 19.2 % — ABNORMAL LOW (ref 36.0–46.0)
Hemoglobin: 6.4 g/dL — CL (ref 12.0–15.0)
MCH: 39.3 pg — ABNORMAL HIGH (ref 26.0–34.0)
MCHC: 33.3 g/dL (ref 30.0–36.0)
MCV: 117.8 fL — ABNORMAL HIGH (ref 80.0–100.0)
Platelets: 57 10*3/uL — ABNORMAL LOW (ref 150–400)
RBC: 1.63 MIL/uL — ABNORMAL LOW (ref 3.87–5.11)
RDW: 14.1 % (ref 11.5–15.5)
WBC: 4 10*3/uL (ref 4.0–10.5)
nRBC: 0 % (ref 0.0–0.2)

## 2018-11-02 LAB — MAGNESIUM
MAGNESIUM: 1.7 mg/dL (ref 1.7–2.4)
Magnesium: 1.6 mg/dL — ABNORMAL LOW (ref 1.7–2.4)

## 2018-11-02 LAB — LACTIC ACID, PLASMA: Lactic Acid, Venous: 1.1 mmol/L (ref 0.5–1.9)

## 2018-11-02 LAB — PROTIME-INR
INR: 1.33
Prothrombin Time: 16.3 seconds — ABNORMAL HIGH (ref 11.4–15.2)

## 2018-11-02 LAB — INFLUENZA PANEL BY PCR (TYPE A & B)
INFLBPCR: NEGATIVE
Influenza A By PCR: NEGATIVE

## 2018-11-02 LAB — TSH: TSH: 0.689 u[IU]/mL (ref 0.350–4.500)

## 2018-11-02 LAB — PROCALCITONIN: Procalcitonin: 0.96 ng/mL

## 2018-11-02 MED ORDER — FOLIC ACID 1 MG PO TABS
1.0000 mg | ORAL_TABLET | Freq: Every day | ORAL | Status: DC
  Administered 2018-11-02: 1 mg via ORAL
  Filled 2018-11-02: qty 1

## 2018-11-02 MED ORDER — ACETAMINOPHEN 650 MG RE SUPP: 650.0000 mg | Freq: Four times a day (QID) | RECTAL | Status: DC | PRN

## 2018-11-02 MED ORDER — GABAPENTIN 300 MG PO CAPS
300.0000 mg | ORAL_CAPSULE | Freq: Three times a day (TID) | ORAL | Status: DC
  Administered 2018-11-02: 300 mg via ORAL
  Filled 2018-11-02: qty 1

## 2018-11-02 MED ORDER — PIPERACILLIN-TAZOBACTAM 3.375 G IVPB 30 MIN
3.3750 g | Freq: Once | INTRAVENOUS | Status: AC
  Administered 2018-11-02: 3.375 g via INTRAVENOUS
  Filled 2018-11-02: qty 50

## 2018-11-02 MED ORDER — OXYCODONE HCL 5 MG PO TABS
5.0000 mg | ORAL_TABLET | ORAL | Status: DC | PRN
  Administered 2018-11-02: 10 mg via ORAL
  Filled 2018-11-02: qty 2

## 2018-11-02 MED ORDER — ACETAMINOPHEN 325 MG PO TABS
650.0000 mg | ORAL_TABLET | Freq: Four times a day (QID) | ORAL | Status: DC | PRN
  Administered 2018-11-02 (×2): 650 mg via ORAL
  Filled 2018-11-02 (×2): qty 2

## 2018-11-02 MED ORDER — LACTULOSE 10 GM/15ML PO SOLN: 20.0000 g | Freq: Two times a day (BID) | ORAL | 0 refills | Status: AC

## 2018-11-02 MED ORDER — PANCRELIPASE (LIP-PROT-AMYL) 36000-114000 UNITS PO CPEP
72000.0000 [IU] | ORAL_CAPSULE | Freq: Three times a day (TID) | ORAL | Status: DC
  Administered 2018-11-02: 72000 [IU] via ORAL
  Filled 2018-11-02 (×3): qty 2

## 2018-11-02 MED ORDER — SODIUM CHLORIDE 0.9 % IV BOLUS
500.0000 mL | Freq: Once | INTRAVENOUS | Status: AC
  Administered 2018-11-02: 500 mL via INTRAVENOUS

## 2018-11-02 MED ORDER — OXYCODONE HCL 5 MG PO TABS
10.0000 mg | ORAL_TABLET | Freq: Once | ORAL | Status: AC
  Administered 2018-11-02: 10 mg via ORAL
  Filled 2018-11-02: qty 2

## 2018-11-02 MED ORDER — SODIUM CHLORIDE 0.9% IV SOLUTION
Freq: Once | INTRAVENOUS | Status: AC
  Administered 2018-11-02: 09:00:00 via INTRAVENOUS

## 2018-11-02 MED ORDER — SODIUM CHLORIDE 0.9% IV SOLUTION
Freq: Once | INTRAVENOUS | Status: AC
  Administered 2018-11-02: 08:00:00 via INTRAVENOUS

## 2018-11-02 MED ORDER — SODIUM CHLORIDE 0.9 % IV SOLN
INTRAVENOUS | Status: DC
  Administered 2018-11-02: 04:00:00 via INTRAVENOUS

## 2018-11-02 MED ORDER — MAGNESIUM SULFATE 2 GM/50ML IV SOLN
2.0000 g | Freq: Once | INTRAVENOUS | Status: AC
  Administered 2018-11-02: 2 g via INTRAVENOUS
  Filled 2018-11-02: qty 50

## 2018-11-02 MED ORDER — LACTULOSE 10 GM/15ML PO SOLN
30.0000 g | Freq: Two times a day (BID) | ORAL | Status: DC
  Administered 2018-11-02: 30 g via ORAL
  Filled 2018-11-02 (×2): qty 60

## 2018-11-02 MED ORDER — PANTOPRAZOLE SODIUM 40 MG PO TBEC
40.0000 mg | DELAYED_RELEASE_TABLET | Freq: Every day | ORAL | Status: DC
  Administered 2018-11-02: 40 mg via ORAL
  Filled 2018-11-02: qty 1

## 2018-11-02 MED ORDER — ONDANSETRON HCL 4 MG/2ML IJ SOLN
4.0000 mg | Freq: Four times a day (QID) | INTRAMUSCULAR | Status: DC | PRN
  Administered 2018-11-02: 4 mg via INTRAVENOUS
  Filled 2018-11-02: qty 2

## 2018-11-02 MED ORDER — OXYCODONE HCL 5 MG PO TABS
5.0000 mg | ORAL_TABLET | Freq: Four times a day (QID) | ORAL | Status: DC | PRN
  Administered 2018-11-02: 5 mg via ORAL
  Filled 2018-11-02: qty 1

## 2018-11-02 NOTE — Progress Notes (Signed)
PT Cancellation Note  Patient Details Name: Kamorie Aldous MRN: [id number] DOB: June 26, 1971   Cancelled Treatment:    Reason Eval/Treat Not Completed: Patient not medically ready, patient to get blood. Will check back later.    Claretha Cooper 11/02/2018, 8:27 AM [place] Pager [phone] Office [phone]

## 2018-11-02 NOTE — ED Notes (Signed)
Pt. Made aware for the need of urine specimen. 

## 2018-11-02 NOTE — ED Notes (Addendum)
Patient made aware of [facility] plan of care for patient to stay in hospital. Patient left AMA and signed [place] paperwork.

## 2018-11-02 NOTE — Discharge Summary (Signed)
Discharge Summary  Jamie Allen LGX:[id number] DOB: 01/19/1971  PCP: Jeanella Anton, NP  Admit date: 11/01/2018 Discharge date: 11/02/2018  Time spent: 35 mins   Recommendations for Outpatient Follow-up:  1. Follow-up with GI within a week 2. Take your medications as prescribed 3. Patient request to be discharged today due to appointment this afternoon 11/02/2018 for liver transplant.  Discussed with GI Zehr,Jessica PA who will follow up with the patient closely and set up appointment.  Discharge Diagnoses:  Active Hospital Problems   Diagnosis Date Noted  . Sepsis ([place]) 11/02/2018  . Prolonged Q-T interval on ECG 10/14/2018  . Pancreatic insufficiency   . Chronic pancreatitis ([place]) 07/14/2018  . Thrombocytopenia ([place]) 12/24/2017  . Tobacco use 12/23/2017  . Pancytopenia ([place])   . Alcoholic cirrhosis of liver ([place]) 09/22/2017  . Anemia of chronic disease 05/28/2017  . GERD (gastroesophageal reflux disease)   . Hypothyroidism 12/06/2015  . Abdominal pain   . Cirrhosis ([place]) 07/21/2013    Resolved Hospital Problems  No resolved problems to display.    Discharge Condition: Stable  Diet recommendation: Resume previous diet  Vitals:   11/02/18 1030 11/02/18 1228  BP: (!) 123/54 (!) 108/53  Pulse: 88 82  Resp: 20 (!) 22  Temp:    SpO2: 100% 100%    History of present illness:  Jamie Allen is a 48 y.o. female with medical history significant of cirrhosis(NASH, EtOH use) on liver transplant list, Hx ofcholecystitiss/pcholecystectomy, pancreatic insufficiency, chronic anemia and thrombocytopenia, depression/anxiety, chronic pain/neuropathy,and current tobacco use presented with generalized pain, abdominal discomfort, nausea, and vomiting. Feels like her abdomen has been distended.  Pain is worse in epigastric area.  She also noted that she have had some petechiae and worsening bruising around her abdomen. She was worried because her of face and skin has  been more yellow over the last few days.  At home fever up to 102 but decreased down to 100 after taking Tylenol. She is having some chills and nausea with multiple episodes of vomiting. Reports right-sided chest pain is been intermittent and associated some shortness of breath and worsening anxiety she has frequent palpitations usually prior to vomiting.  States she is interested in quitting smoking States last EtOH was 1 year ago  She has been somewhat more confused States she has no true allergy to Lactulose  She missed today's appointment with GI doctor because she was not feeling well.  Last time was seen in the emergency department on 5 January nausea vomiting and abdominal pain was treated supportively and was able to be discharged to home.  Was recently admitted for nausea vomiting generalized abdominal pain Christmas 2019She was started on intravenous fluids and improved she had prior similar admission in beginning of December 12/7-104-Nov-202019at which time a CT scan initially showed CBD dilatation. She required a paracentesis and underwent MRCP which did not show any CBD obstruction. She was started on Lasix and spironolactone. Patient states she is undergoing liver transplant evaluation as her sister is a Orthoptist and willing to donate, has an appt on 11/02/18 states she supposed to see a Psychologist, sport and exercise maybe in [place].       Regarding pertinent Chronic problems:   Chronic anemia requiring blood transfusions in the past followed by hematology  Of cirrhosis followed by [facility] at [facility] liver transplant list While in ER:   11/02/18: seen and examined at her bedside. Adamant about leaving to go to her appointment this afternoon. Will follow up closely with GI  post hospitalization.  Hospital Course:  Active Problems:   Cirrhosis (HCC)   Abdominal pain   Hypothyroidism   GERD (gastroesophageal reflux disease)   Anemia of chronic disease   Pancytopenia (HCC)   Tobacco  use   Thrombocytopenia (HCC)   Alcoholic cirrhosis of liver (HCC)   Chronic pancreatitis (HCC)   Pancreatic insufficiency   Prolonged Q-T interval on ECG   Sepsis ([place])  Present on Admission: . Sepsis ([place]) -completed IV antibiotics Zosyn for treatment of possible SBP. No ascites present on CT abd.  Await results of blood cultures obtain UA and urine culture. Wants to leave for appointment. Will f/u with GI . Cirrhosis ([place]) - MELD score 17 with 6 % 72mmortality, patient states she is currently on liver transplant list from living donor and [place]. Hepatic encephalopathy will treat with lactulose and titrate to at least 3 bowel movements per day . Hypothyroidism chronic stable continue home medications . Anemia of chronic disease-as per GI we will hold off on blood transfusions at that time unless hemoglobin goes below 7. Order type and screen . thrombocytopenia in setting of cirrhosis likely resulting in petechial rash. . Chronic pancreatitis ([place] causing chronic pain continue pain medication and treat supportively . Abdominal pain -evaluate for possible SBP appreciate IR consult for diagnostic paracentesis  . GERD (gastroesophageal reflux disease) chronic stable continue home medications . Tobacco use-  - Spoke about importance of quitting spent 5 minutes discussing options for treatment, prior attempts at quitting, and dangers of smoking  -At this point patient is   interested in quitting  - order nicotine patch   - nursing tobacco cessation protocol    . Prolonged Q-T interval on ECG - - will monitor on tele avoid QT prolonging medications, rehydrate correct electrolytes   Other plan as per orders.    Code Status:  FULL CODE as per patient  I had personally discussed CODE STATUS with patient                                         Consults called:  [facility], IR    Discharge Exam: BP (!) 108/53   Pulse 82   Temp 97.9 F (36.6  C) (Oral)   Resp (!) 22   Ht 5' 2"  (1.575 m)   Wt 50.3 kg   LMP 06/01/2014   SpO2 100%   BMI 20.30 kg/m  . General: 48y.o. year-old female well developed well nourished in no acute distress.  Alert and oriented x3. . Cardiovascular: Regular rate and rhythm with no rubs or gallops.  No thyromegaly or JVD noted.   .[place] KitchenRespiratory: Clear to auscultation with no wheezes or rales. Good inspiratory effort. . Abdomen: Soft nontender mildly distended with normal bowel sounds x4 quadrants. . Musculoskeletal: No lower extremity edema. 2/4 pulses in all 4 extremities. . Skin: No ulcerative lesions. Has petechiae rashes in upper extremities bilaterally. .[place] KitchenPsychiatry: Mood is appropriate for condition and setting  Discharge Instructions You were cared for by a hospitalist during your hospital stay. If you have any questions about your discharge medications or the care you received while you were in the hospital after you are discharged, you can call the unit and asked to speak with the hospitalist on call if the hospitalist that took care of you is not available. Once you are  discharged, your primary care physician will handle any further medical issues. Please note that NO REFILLS for any discharge medications will be authorized once you are discharged, as it is imperative that you return to your primary care physician (or establish a relationship with a primary care physician if you do not have one) for your aftercare needs so that they can reassess your need for medications and monitor your lab values.   Allergies as of 11/02/2018      Reactions   Iohexol Hives, Itching, Swelling   Lactose Intolerance (gi) Diarrhea, Nausea Only   Other Other (See Comments)   Spicy foods "tear up my stomach"   Vancomycin Itching   Nose itching   Morphine And Related Itching, Other (See Comments)   Can tolerate with Benadryl      Medication List    STOP taking these medications   MIDOL COMPLETE 500-60-15 MG  Tabs Generic drug:  Acetaminophen-Caff-Pyrilamine   promethazine 25 MG suppository Commonly known as:  PHENERGAN   promethazine 25 MG tablet Commonly known as:  PHENERGAN   sertraline 50 MG tablet Commonly known as:  ZOLOFT   sucralfate 1 GM/10ML suspension Commonly known as:  CARAFATE     TAKE these medications   diphenoxylate-atropine 2.5-0.025 MG tablet Commonly known as:  LOMOTIL Take 1 tablet by mouth 2 (two) times daily as needed for diarrhea or loose stools.   esomeprazole 20 MG capsule Commonly known as:  NEXIUM Take 1 capsule (20 mg total) by mouth daily as needed (indigestion).   folic acid 1 MG tablet Commonly known as:  FOLVITE Take 1 tablet (1 mg total) by mouth daily.   furosemide 40 MG tablet Commonly known as:  LASIX Take 1 tablet (40 mg total) by mouth daily.   gabapentin 300 MG capsule Commonly known as:  NEURONTIN Take 1 capsule (300 mg total) by mouth 3 (three) times daily.   lactulose 10 GM/15ML solution Commonly known as:  CHRONULAC Take 30 mLs (20 g total) by mouth 2 (two) times daily.   lipase/protease/amylase 36000 UNITS Cpep capsule Commonly known as:  CREON Take 2 capsules (72,000 Units total) by mouth 3 (three) times daily before meals.   MAGNESIUM PO Take 1 tablet by mouth daily.   Melatonin 10 MG Tabs Take 10 mg by mouth at bedtime as needed (for sleep).   NARCAN 4 MG/0.1ML Liqd nasal spray kit Generic drug:  naloxone Place 0.1 mg into the nose daily as needed (accidental overdose).   ondansetron 4 MG disintegrating tablet Commonly known as:  ZOFRAN ODT Take 1 tablet (4 mg total) by mouth every 8 (eight) hours as needed for nausea or vomiting.   oxyCODONE 5 MG immediate release tablet Commonly known as:  Oxy IR/ROXICODONE Take 1 tablet (5 mg total) by mouth every 6 (six) hours as needed for moderate pain.   spironolactone 100 MG tablet Commonly known as:  ALDACTONE Take 1 tablet (100 mg total) by mouth daily.   zinc  sulfate 220 (50 Zn) MG capsule Take 1 capsule (220 mg total) by mouth daily.      Allergies  Allergen Reactions  . Iohexol Hives, Itching and Swelling  . Lactose Intolerance (Gi) Diarrhea and Nausea Only  . Other Other (See Comments)    Spicy foods "tear up my stomach"  . Vancomycin Itching    Nose itching  . Morphine And Related Itching and Other (See Comments)    Can tolerate with Benadryl      The results  of significant diagnostics from this hospitalization (including imaging, microbiology, ancillary and laboratory) are listed below for reference.    Significant Diagnostic Studies: Ct Abdomen Pelvis Wo Contrast  Result Date: 11/01/2018 CLINICAL DATA:  48 year old female with cirrhosis, on liver transplant list. Nausea vomiting today. EXAM: CT ABDOMEN AND PELVIS WITHOUT CONTRAST TECHNIQUE: Multidetector CT imaging of the abdomen and pelvis was performed following the standard protocol without IV contrast. COMPARISON:  CT Abdomen and Pelvis 10/24/2018 and earlier. FINDINGS: Lower chest: Minor lung base atelectasis, regressed from earlier this month. No cardiomegaly, pericardial or pleural effusion. Hepatobiliary: Surgically absent gallbladder. Stable liver morphology. No discrete liver lesion in the absence of contrast. No ascites. Stable upper abdominal varices. Pancreas: Negative noncontrast appearance. Spleen: Stable marked splenomegaly. Adrenals/Urinary Tract: Stable mild-to-moderate left renal atrophy and noncontrast appearance of the right kidney. The proximal ureters are decompressed. The urinary bladder is diminutive and unremarkable. Stomach/Bowel: Similar appearance of retained stool in the distal colon. Fluid in the lumen of the right colon to the splenic flexure. Fluid-filled but nondilated small bowel throughout the abdomen, small bowel caliber stable from last week. Negative noncontrast stomach. No free air. Vascular/Lymphatic: Mild Calcified aortic atherosclerosis. Vascular  patency is not evaluated in the absence of IV contrast. Reproductive: Negative noncontrast appearance. Other: No pelvic ascites. Musculoskeletal: No acute osseous abnormality identified. IMPRESSION: Stable noncontrast CT appearance of the abdomen and pelvis from 1 week ago. Cirrhosis with splenomegaly and partial left renal atrophy. No new abnormality identified. Electronically Signed   By: Genevie Ann M.D.   On: 11/01/2018 23:50   Ct Abdomen Pelvis Wo Contrast  Result Date: 10/24/2018 CLINICAL DATA:  Pt presents with c/o vomiting and abdominal pain. Pt also reports she feels like her heart is racing. Pt's family at beside reports the heart racing does happen when she begins vomiting. Pt has a diagnosis of pancreatitis. Rt sided back pain. No hx of ca. Chole surgery EXAM: CT ABDOMEN AND PELVIS WITHOUT CONTRAST TECHNIQUE: Multidetector CT imaging of the abdomen and pelvis was performed following the standard protocol without IV contrast. COMPARISON:  09/25/2018 FINDINGS: Lower chest: No acute abnormality. Hepatobiliary: Liver shows morphologic changes suggesting cirrhosis with relative enlargement of the lateral segment of the left lobe and caudate lobe and mild surface nodularity. No liver mass or focal lesion. Gallbladder surgically absent. No bile duct dilation. Pancreas: Unremarkable. No pancreatic ductal dilatation or surrounding inflammatory changes. Spleen: Enlarged measuring 14.4 x 7 x 16.9 cm, similar to the prior CT. No splenic mass or focal lesion. Adrenals/Urinary Tract: No adrenal masses. Left renal atrophy. Right kidney normal in size. No renal masses, stones or hydronephrosis. Normal ureters. Normal bladder. Stomach/Bowel: Stomach is unremarkable. Small bowel and colon are normal in caliber. No wall thickening or inflammation. Appendix not visualized. No evidence of appendicitis. Vascular/Lymphatic: Prominent Periceliac lymph nodes, largest measuring 1 cm short axis. No other prominent lymph nodes. Mild  aortic atherosclerosis. No aneurysm. Upper abdominal varices, stable. Reproductive: Uterus and bilateral adnexa are unremarkable. Other: No abdominal wall hernia or abnormality. No abdominopelvic ascites. Musculoskeletal: No acute or significant osseous findings. IMPRESSION: 1. No acute findings within the abdomen or pelvis. 2. Cirrhosis and splenomegaly. Upper abdominal varices. No current ascites. 3. Left renal scarring/atrophy, stable. 4. No bowel obstruction or inflammation. 5. Mild aortic atherosclerosis. Electronically Signed   By: Lajean Manes M.D.   On: 10/24/2018 17:05   Dg Chest 2 View  Result Date: 11/01/2018 CLINICAL DATA:  Hypertension. Abdominal pain with nausea and vomiting EXAM: CHEST -  2 VIEW COMPARISON:  February 01, 2018. FINDINGS: There is mild scarring in the left base laterally. There is no edema or consolidation. The heart size and pulmonary vascularity are normal. No adenopathy. No bone lesions. IMPRESSION: Mild scarring left base. No edema or consolidation. Heart size normal. Electronically Signed   By: Lowella Grip III M.D.   On: 11/01/2018 19:44   Dg Abdomen 1 View  Result Date: 10/14/2018 CLINICAL DATA:  Pancreatitis with vomiting and abdominal pain. EXAM: ABDOMEN - 1 VIEW COMPARISON:  CT 09/25/2018 FINDINGS: Prominent splenic shadow consistent with splenomegaly. Nonspecific scattered air containing small and large bowel loops without obstruction. Cholecystectomy clips are present. There is no free air. No acute osseous abnormality. Gas is noted within the rectum. IMPRESSION: Splenomegaly is identified. Nonobstructive bowel gas pattern.  No free air. Electronically Signed   By: Ashley Royalty M.D.   On: 10/14/2018 22:03   US Abdomen Limited  Result Date: 10/15/2018 CLINICAL DATA:  Evaluate for ascites. EXAM: LIMITED ABDOMEN ULTRASOUND FOR ASCITES TECHNIQUE: Limited ultrasound survey for ascites was performed in all four abdominal quadrants. COMPARISON:  Ultrasound obtained  earlier today, 10/15/2018 at 7:33 a.m. FINDINGS: No ascites visualized in the 4 quadrants of the abdomen. IMPRESSION: No ascites. Electronically Signed   By: Lajean Manes M.D.   On: 10/15/2018 11:56   US Abdomen Limited Ruq  Result Date: 10/15/2018 CLINICAL DATA:  Cirrhosis, prior cholecystectomy EXAM: ULTRASOUND ABDOMEN LIMITED RIGHT UPPER QUADRANT COMPARISON:  MRI abdomen dated 09/27/2018 FINDINGS: Gallbladder: Surgically absent. Common bile duct: Diameter: 10 mm. Liver: Hyperechoic hepatic parenchyma with coarse hepatic echotexture and a nodular hepatic contour. No focal hepatic lesion is seen. Portal vein is patent on color Doppler imaging with normal direction of blood flow towards the liver. IMPRESSION: Cirrhosis. Superimposed hepatic steatosis is not excluded. No focal hepatic lesion is seen. Status post cholecystectomy. Dilated common duct, measuring 10 mm, likely postsurgical. Electronically Signed   By: Julian Hy M.D.   On: 10/15/2018 07:52    Microbiology: Recent Results (from the past 240 hour(s))  Blood culture (routine x 2)     Status: None (Preliminary result)   Collection Time: 11/02/18  1:01 AM  Result Value Ref Range Status   Specimen Description   Final    BLOOD RIGHT FOREARM Performed at [facility], [address] [place], [place] [address]    Special Requests   Final    BOTTLES DRAWN AEROBIC AND ANAEROBIC Blood Culture adequate volume Performed at [facility] [address], [place], [place] [address]    Culture PENDING  Incomplete   Report Status PENDING  Incomplete     Labs: Basic Metabolic Panel: Recent Labs  Lab 11/01/18 1929 11/01/18 2302 11/02/18 0509  NA 134*  --  136  K 4.1  --  3.8  CL 104  --  107  CO2 20*  --  19*  GLUCOSE 168*  --  103*  BUN 15  --  18  CREATININE 1.01*  --  1.26*  CALCIUM 9.4  --  8.5*  MG  --  1.7 1.6*  PHOS  --   --  4.1   Liver Function Tests: Recent Labs  Lab 11/01/18 1929  11/02/18 0509  AST 54* 42*  ALT 27 24  ALKPHOS 133* 103  BILITOT 4.5* 4.4*  PROT 7.7 6.5  ALBUMIN 3.7 3.2*   Recent Labs  Lab 11/01/18 1929  LIPASE 46   Recent Labs  Lab 11/01/18 2302  11/02/18 0520  AMMONIA 95* 108*   CBC: Recent Labs  Lab 11/01/18 1929 11/01/18 2227 11/02/18 0509  WBC 3.3* 4.2 4.0  NEUTROABS  --  3.4  --   HGB 7.6* 7.0* 6.4*  HCT 22.7* 21.2* 19.2*  MCV 114.6* 115.8* 117.8*  PLT 64* 58* 57*   Cardiac Enzymes: No results for input(s): CKTOTAL, CKMB, CKMBINDEX, TROPONINI in the last 168 hours. BNP: BNP (last 3 results) No results for input(s): BNP in the last 8760 hours.  ProBNP (last 3 results) No results for input(s): PROBNP in the last 8760 hours.  CBG: No results for input(s): GLUCAP in the last 168 hours.     Signed:  Kayleen Memos, MD [facility] 11/02/2018, 1:07 PM
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
## 2018-11-02 NOTE — Discharge Planning (Signed)
[facility] consulted regarding code 72 for pt.  Pt insurance (Medicaid) does not require code 44 be given.

## 2018-11-02 NOTE — ED Notes (Signed)
Spoke with mid-level Schorr about patient's pain. New orders to be provided.

## 2018-11-02 NOTE — ED Notes (Signed)
Pt switched into hospital bed
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
## 2018-11-04 ENCOUNTER — Emergency Department (HOSPITAL_COMMUNITY)
Admission: EM | Admit: 2018-11-04 | Discharge: 2018-11-04 | Disposition: A | Discharge status: Home / Self Care | Payer: Medicaid Other | Attending: Emergency Medicine | Admitting: Emergency Medicine

## 2018-11-04 ENCOUNTER — Telehealth: Payer: Self-pay | Admitting: Hematology & Oncology

## 2018-11-04 DIAGNOSIS — Z5321 Procedure and treatment not carried out due to patient leaving prior to being seen by health care provider: Secondary | ICD-10-CM | POA: Insufficient documentation

## 2018-11-04 DIAGNOSIS — R109 Unspecified abdominal pain: Secondary | ICD-10-CM | POA: Diagnosis present

## 2018-11-04 NOTE — Telephone Encounter (Signed)
Appointments r/s and letter/calendar mailed per 1/16 sch msg

## 2018-11-05 ENCOUNTER — Ambulatory Visit: Payer: Self-pay | Admitting: Hematology & Oncology

## 2018-11-05 ENCOUNTER — Other Ambulatory Visit: Payer: Self-pay

## 2018-11-05 LAB — TYPE AND SCREEN
ABO/RH(D): O NEG
ANTIBODY SCREEN: POSITIVE
Donor AG Type: NEGATIVE
Donor AG Type: NEGATIVE
Unit division: 0
Unit division: 0

## 2018-11-05 LAB — BPAM RBC
BLOOD PRODUCT EXPIRATION DATE: 202002192359
Blood Product Expiration Date: 202002222359
ISSUE DATE / TIME: 202001140826
Unit Type and Rh: 9500
Unit Type and Rh: 9500

## 2018-11-07 LAB — CULTURE, BLOOD (ROUTINE X 2)
Culture: NO GROWTH
Culture: NO GROWTH
Special Requests: ADEQUATE

## 2018-11-09 ENCOUNTER — Emergency Department (HOSPITAL_COMMUNITY): Payer: Medicaid Other

## 2018-11-09 ENCOUNTER — Emergency Department (HOSPITAL_COMMUNITY)
Admission: EM | Admit: 2018-11-09 | Discharge: 2018-11-09 | Disposition: A | Discharge status: Home / Self Care | Payer: Medicaid Other | Attending: Emergency Medicine | Admitting: Emergency Medicine

## 2018-11-09 ENCOUNTER — Other Ambulatory Visit: Payer: Self-pay

## 2018-11-09 ENCOUNTER — Encounter (HOSPITAL_COMMUNITY): Payer: Self-pay

## 2018-11-09 DIAGNOSIS — F17210 Nicotine dependence, cigarettes, uncomplicated: Secondary | ICD-10-CM | POA: Insufficient documentation

## 2018-11-09 DIAGNOSIS — K7290 Hepatic failure, unspecified without coma: Secondary | ICD-10-CM | POA: Diagnosis not present

## 2018-11-09 DIAGNOSIS — R109 Unspecified abdominal pain: Secondary | ICD-10-CM | POA: Diagnosis present

## 2018-11-09 DIAGNOSIS — E039 Hypothyroidism, unspecified: Secondary | ICD-10-CM | POA: Insufficient documentation

## 2018-11-09 DIAGNOSIS — K7210 Chronic hepatic failure without coma: Secondary | ICD-10-CM

## 2018-11-09 DIAGNOSIS — K7030 Alcoholic cirrhosis of liver without ascites: Secondary | ICD-10-CM | POA: Diagnosis not present

## 2018-11-09 DIAGNOSIS — Z79899 Other long term (current) drug therapy: Secondary | ICD-10-CM | POA: Insufficient documentation

## 2018-11-09 DIAGNOSIS — Z515 Encounter for palliative care: Secondary | ICD-10-CM | POA: Diagnosis not present

## 2018-11-09 LAB — COMPREHENSIVE METABOLIC PANEL
ALT: 34 U/L (ref 0–44)
AST: 73 U/L — ABNORMAL HIGH (ref 15–41)
Albumin: 3.7 g/dL (ref 3.5–5.0)
Alkaline Phosphatase: 124 U/L (ref 38–126)
Anion gap: 9 (ref 5–15)
BUN: 6 mg/dL (ref 6–20)
CO2: 22 mmol/L (ref 22–32)
Calcium: 9.3 mg/dL (ref 8.9–10.3)
Chloride: 106 mmol/L (ref 98–111)
Creatinine, Ser: 0.48 mg/dL (ref 0.44–1.00)
GFR calc non Af Amer: >60 mL/min (ref >60)
Glucose, Bld: 141 mg/dL — ABNORMAL HIGH (ref 70–99)
Potassium: 3 mmol/L — ABNORMAL LOW (ref 3.5–5.1)
Sodium: 137 mmol/L (ref 135–145)
Total Bilirubin: 4.5 mg/dL — ABNORMAL HIGH (ref 0.3–1.2)
Total Protein: 7.7 g/dL (ref 6.5–8.1)

## 2018-11-09 LAB — CBC WITH DIFFERENTIAL/PLATELET
Abs Immature Granulocytes: 0.04 10*3/uL (ref 0.00–0.07)
Basophils Absolute: 0 10*3/uL (ref 0.0–0.1)
Basophils Relative: 1 %
EOS ABS: 0.4 10*3/uL (ref 0.0–0.5)
Eosinophils Relative: 8 %
HCT: 25.1 % — ABNORMAL LOW (ref 36.0–46.0)
Hemoglobin: 8.2 g/dL — ABNORMAL LOW (ref 12.0–15.0)
Immature Granulocytes: 1 %
Lymphocytes Relative: 8 %
Lymphs Abs: 0.5 10*3/uL — ABNORMAL LOW (ref 0.7–4.0)
MCH: 35 pg — ABNORMAL HIGH (ref 26.0–34.0)
MCHC: 32.7 g/dL (ref 30.0–36.0)
MCV: 107.3 fL — ABNORMAL HIGH (ref 80.0–100.0)
MONO ABS: 0.3 10*3/uL (ref 0.1–1.0)
Monocytes Relative: 5 %
Neutro Abs: 4.3 10*3/uL (ref 1.7–7.7)
Neutrophils Relative %: 77 %
Platelets: 73 10*3/uL — ABNORMAL LOW (ref 150–400)
RBC: 2.34 MIL/uL — ABNORMAL LOW (ref 3.87–5.11)
RDW: 19.8 % — AB (ref 11.5–15.5)
WBC: 5.6 10*3/uL (ref 4.0–10.5)
nRBC: 0 % (ref 0.0–0.2)

## 2018-11-09 LAB — RAPID URINE DRUG SCREEN, HOSP PERFORMED
Amphetamines: NOT DETECTED
BENZODIAZEPINES: NOT DETECTED
Barbiturates: NOT DETECTED
Cocaine: NOT DETECTED
Opiates: NOT DETECTED
Tetrahydrocannabinol: NOT DETECTED

## 2018-11-09 LAB — URINALYSIS, ROUTINE W REFLEX MICROSCOPIC
Bilirubin Urine: NEGATIVE
GLUCOSE, UA: NEGATIVE mg/dL
Hgb urine dipstick: NEGATIVE
Ketones, ur: NEGATIVE mg/dL
Leukocytes, UA: NEGATIVE
Nitrite: NEGATIVE
Protein, ur: NEGATIVE mg/dL
Specific Gravity, Urine: 1.003 — ABNORMAL LOW (ref 1.005–1.030)
pH: 7 (ref 5.0–8.0)

## 2018-11-09 LAB — ETHANOL: Alcohol, Ethyl (B): <10 mg/dL (ref <10)

## 2018-11-09 LAB — PROTIME-INR
INR: 1.4
Prothrombin Time: 17 seconds — ABNORMAL HIGH (ref 11.4–15.2)

## 2018-11-09 LAB — LIPASE, BLOOD: Lipase: 37 U/L (ref 11–51)

## 2018-11-09 MED ORDER — OXYCODONE HCL 10 MG PO TABS: 10.0000 mg | ORAL_TABLET | Freq: Three times a day (TID) | ORAL | 0 refills | Status: AC | PRN

## 2018-11-09 MED ORDER — DEXAMETHASONE 1 MG PO TABS: 1.0000 mg | ORAL_TABLET | Freq: Every day | ORAL | 0 refills | Status: AC

## 2018-11-09 MED ORDER — SODIUM CHLORIDE 0.9 % IV BOLUS
250.0000 mL | Freq: Once | INTRAVENOUS | Status: AC
  Administered 2018-11-09: 250 mL via INTRAVENOUS

## 2018-11-09 MED ORDER — OXYCODONE HCL 5 MG PO TABS
5.0000 mg | ORAL_TABLET | Freq: Once | ORAL | Status: AC
  Administered 2018-11-09: 5 mg via ORAL
  Filled 2018-11-09: qty 1

## 2018-11-09 MED ORDER — MORPHINE SULFATE (PF) 2 MG/ML IV SOLN
2.0000 mg | Freq: Once | INTRAVENOUS | Status: AC
  Administered 2018-11-09: 2 mg via INTRAVENOUS
  Filled 2018-11-09: qty 1

## 2018-11-09 MED ORDER — FENTANYL CITRATE (PF) 100 MCG/2ML IJ SOLN
50.0000 ug | Freq: Once | INTRAMUSCULAR | Status: AC
  Administered 2018-11-09: 50 ug via INTRAVENOUS
  Filled 2018-11-09: qty 2

## 2018-11-09 MED ORDER — LIDOCAINE-EPINEPHRINE (PF) 2 %-1:200000 IJ SOLN
20.0000 mL | Freq: Once | INTRAMUSCULAR | Status: DC
  Filled 2018-11-09: qty 20

## 2018-11-09 NOTE — Discharge Instructions (Signed)
Please take medications as previously prescribed and per Dr. Delanna Ahmadi recommendations Please follow-up as per Dr. Delanna Ahmadi referrals.

## 2018-11-09 NOTE — ED Notes (Signed)
Awaiting case management consult prior to DC

## 2018-11-09 NOTE — ED Notes (Signed)
Pt updated on plan of care. Pt requesting pain medication. MD made aware

## 2018-11-09 NOTE — ED Triage Notes (Signed)
Pt arrives via POV from home. Pt reports that she is on the liver transplant list. Pt reports that today she is having abd pain and distension.

## 2018-11-09 NOTE — ED Notes (Signed)
Hospice at bedside 

## 2018-11-09 NOTE — ED Notes (Signed)
Per Case management: Hospice to come see pt in ED

## 2018-11-09 NOTE — ED Notes (Signed)
Pt requesting pain medication at this time. MD made aware.

## 2018-11-09 NOTE — Discharge Planning (Signed)
[facility] accepted referral for home hospice.  Will be at bedside in about 1 hour to discuss home equipment and next steps with pt and family.

## 2018-11-09 NOTE — ED Notes (Signed)
X-ray at bedside
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
## 2018-11-09 NOTE — ED Notes (Signed)
Pt called out stating "The pain medication did not work. I dont think I will be able to sit still through the procedure" Pt states pain is 8/10

## 2018-11-09 NOTE — Consult Note (Signed)
Palliative Care Consult  Patient requesting Palliative Care consult. End Stage Liver Disease-Cirrhosis, Chronic Abdominal Pain, ETOH related chronic pancreatitis  I have reviewed Jamie Allen chart in detail and discussed her disease progression and goals with her.  Main Issues: ESLD will not be a transplant candidate most likely-she just wants to be comfortable, "I cant live like this"-Chronic Abdominal Pain, Severe Anxiety, PTSD, Emotional/Spiritual pain and suffering. Very frequent ED care-related to pain and ascites. She is on a medication contract with an NP at [facility] for pain management.  Liver related pain, chronic pancreatitis pain, pain from chronic constipation, capsular pain- multifactorial.  Recommendations:  1. Will continue oxycodone, but instead of once daily dosing as her pain clinic was doing will schedule her opioids TID and add on low dose decadron to help with any inflammatory capsular pain.  2. She needs help with anxiety, will get pain under control and consider options for controlling her anxiety- will need to be cautious with benzos but she may tolerate a very low dose.  3. She is appropriate for a hospice referral based on her goals of care. She has significant weight loss, recent SBP, disease progression, EDP attempted paracentesis today- minimal fluid. +constipated has not taken lactulose.  4. Prognosis <6 months, home hospice referral for support  5. I introduced ACP and MOST form completion- she desires DNR and for someone to help her tell her family and get affairs in order.  6. She would benefit from CSW support and counseling as well as chaplain.  Recommendations discussed with EDP and ED CMRN. I sent 1 mo of oxycodone and dexamethasone scripts to [facility] on [address].  Lane Hacker, DO Palliative Medicine

## 2018-11-19 ENCOUNTER — Other Ambulatory Visit: Payer: Self-pay | Admitting: Critical Care Medicine

## 2018-11-26 ENCOUNTER — Ambulatory Visit: Payer: Self-pay | Admitting: Gastroenterology

## 2018-11-29 ENCOUNTER — Inpatient Hospital Stay: Payer: Medicaid Other | Attending: Hematology & Oncology

## 2018-11-29 ENCOUNTER — Encounter: Payer: Self-pay | Admitting: Gastroenterology

## 2018-11-29 ENCOUNTER — Inpatient Hospital Stay: Payer: Medicaid Other | Admitting: Hematology & Oncology

## 2018-12-03 ENCOUNTER — Encounter: Payer: Self-pay | Admitting: Family

## 2018-12-19 DEATH — deceased

## 2019-01-29 NOTE — Telephone Encounter (Signed)
error
